# Patient Record
Sex: Male | Born: 1935
Health system: Southern US, Community
[De-identification: ages and names within clinical notes are randomized; demographics above are authoritative.]

## PROBLEM LIST (undated history)

## (undated) DIAGNOSIS — Z9289 Personal history of other medical treatment: Secondary | ICD-10-CM

## (undated) DIAGNOSIS — M1990 Unspecified osteoarthritis, unspecified site: Secondary | ICD-10-CM

## (undated) DIAGNOSIS — I509 Heart failure, unspecified: Secondary | ICD-10-CM

## (undated) DIAGNOSIS — D631 Anemia in chronic kidney disease: Secondary | ICD-10-CM

## (undated) DIAGNOSIS — Z9981 Dependence on supplemental oxygen: Secondary | ICD-10-CM

## (undated) DIAGNOSIS — E119 Type 2 diabetes mellitus without complications: Secondary | ICD-10-CM

## (undated) DIAGNOSIS — E785 Hyperlipidemia, unspecified: Secondary | ICD-10-CM

## (undated) DIAGNOSIS — N183 Chronic kidney disease, stage 3 unspecified: Secondary | ICD-10-CM

## (undated) DIAGNOSIS — I10 Essential (primary) hypertension: Secondary | ICD-10-CM

## (undated) DIAGNOSIS — J449 Chronic obstructive pulmonary disease, unspecified: Secondary | ICD-10-CM

## (undated) DIAGNOSIS — R0600 Dyspnea, unspecified: Secondary | ICD-10-CM

## (undated) HISTORY — DX: Essential (primary) hypertension: I10

## (undated) HISTORY — DX: Chronic obstructive pulmonary disease, unspecified: J44.9

## (undated) HISTORY — DX: Hyperlipidemia, unspecified: E78.5

## (undated) HISTORY — PX: EYE SURGERY: SHX253

## (undated) HISTORY — DX: Type 2 diabetes mellitus without complications: E11.9

---
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
## 2004-07-26 ENCOUNTER — Ambulatory Visit: Payer: Self-pay | Admitting: Internal Medicine

## 2005-06-06 ENCOUNTER — Ambulatory Visit: Payer: Self-pay | Admitting: Emergency Medicine

## 2005-07-18 ENCOUNTER — Ambulatory Visit: Payer: Self-pay | Admitting: Emergency Medicine

## 2005-09-14 ENCOUNTER — Ambulatory Visit: Payer: Self-pay | Admitting: Emergency Medicine

## 2006-03-07 ENCOUNTER — Ambulatory Visit: Payer: Self-pay | Admitting: Emergency Medicine

## 2006-09-10 ENCOUNTER — Ambulatory Visit: Payer: Self-pay | Admitting: Emergency Medicine

## 2006-10-24 DIAGNOSIS — J449 Chronic obstructive pulmonary disease, unspecified: Secondary | ICD-10-CM

## 2006-10-24 DIAGNOSIS — E119 Type 2 diabetes mellitus without complications: Secondary | ICD-10-CM | POA: Insufficient documentation

## 2006-10-24 DIAGNOSIS — E669 Obesity, unspecified: Secondary | ICD-10-CM | POA: Insufficient documentation

## 2006-10-24 DIAGNOSIS — J309 Allergic rhinitis, unspecified: Secondary | ICD-10-CM | POA: Insufficient documentation

## 2006-10-24 DIAGNOSIS — I10 Essential (primary) hypertension: Secondary | ICD-10-CM

## 2006-10-24 DIAGNOSIS — Z87891 Personal history of nicotine dependence: Secondary | ICD-10-CM | POA: Insufficient documentation

## 2007-01-16 ENCOUNTER — Ambulatory Visit: Payer: Self-pay | Admitting: Emergency Medicine

## 2009-04-22 ENCOUNTER — Ambulatory Visit: Payer: Self-pay | Admitting: Gastroenterology

## 2010-05-17 NOTE — Assessment & Plan Note (Signed)
[facility]                             PULMONARY OFFICE NOTE   DEWIGHT, FEARNOW                          MRN:          [id number]  DATE:09/10/2006                            DOB:          1933/11/28    SUBJECTIVE:  This is a scheduled 6 month followup visit for [name]  who is a 77 year old gentleman with obesity, diabetes, and significant  tobacco history.  We have been treating him for moderate COPD.  He  returns today, telling me that his breathing is great.  He does have  some difficulty when bending over.  There has not been any change in his  exertional tolerance since our last visit.  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  He has had some lower extremity  edema but otherwise no change in his symptoms.  He does not cough or  wheeze on a regular basis.  He has not been taking his Spiriva.  Instead, he has been inhaling off his wife's Spiriva after she completes  an initial dose administration.  I suspect that he has not been  receiving any of the medication given this technique.   MEDICATIONS:  1. Glucophage 1000 mg b.i.d.  2. Avalide 300/12.5 mg daily.  3. Actos 30 mg daily.  4. Furosemide 20 mg daily.  5. Spiriva 1 inhalation daily which she has not been taking correctly.  6. Multivitamin once daily.  7. Albuterol 2 puffs q.6h. p.r.n. for shortness of breath.   OBJECTIVE:  This is a pleasant obese man who is in no distress on room  air.  His weight 260 pounds which is 20 pounds higher than this time a year  ago.  Temperature 98.0, blood pressure 126/60, heart rate 74, SpO2 96%  on room air.  HEENT:  Benign.  NECK:  Supple without lymphadenopathy or stridor.  LUNGS:  Distant but clear.  He has no wheezing nor no forced expiration.  HEART:  Regular without murmur.  ABDOMEN:  Obese, soft, nontender, with positive bowel sounds.  EXTREMITIES:  Some mild pretibial edema.  NEUROLOGIC:  Nonfocal exam.    IMPRESSION:  1. Moderate chronic obstructive pulmonary disease, not currently on      bronchodilators given his technique with his Spiriva.  2. Obesity.  3. Diabetes mellitus.  4. Hypertension.   PLAN:  1. I have asked Mr. Suver to restart his Spiriva and to take it as      directed.  We reviewed the technique with him today.  2. I discussed weight loss including a diet and exercise plan and also      good diabetes control with him today.  I feel that his continued      weight gain is going to impact his pulmonary status at some spoint.  3. I will follow up with Mr. Parlette in 3 months to assess his status      on the Spiriva or sooner if he has any difficulty in the interim.     Collene Gobble, MD  Electronically Signed  RSB/MedQ  DD: 09/10/2006  DT: 09/10/2006  Job #: [id number]   cc:   Lisette Grinder

## 2010-05-20 NOTE — Assessment & Plan Note (Signed)
[place]                             PULMONARY OFFICE NOTE   NAYTHEN, FRARY                          MRN:          [id number]  DATE:03/07/2006                            DOB:          [birth year]    SUBJECTIVE:  Arthur [name] is a 77 year old gentleman who follows up today  for his chronic obstructive pulmonary disease. This is a regularly  scheduled follow up visit for him. He tells me that his breathing is  doing quite well. He is able to exert himself without any significant  limitations. He does have some minor cough, but this is not terribly  problematic. He denies any wheezing. He has not had any exacerbation  since our last visit, which was in September 2007.   MEDICATIONS:  1. Glucophage 1000 mg b.i.d.  2. Avalide 300/12.5 mg daily.  3. Actos 30 mg daily.  4. Furosemide 20 mg daily.  5. Spiriva 1 inhalation daily.  6. Multivitamin once daily.  7. Albuterol 2 puffs every 6 hours p.r.n. for shortness of breath.   REVIEW OF SYSTEMS:  [name] otherwise complains of lower extremity  edema that he believes began when he started taking his Actos.   PHYSICAL EXAMINATION:  GENERAL: This is a pleasant, somewhat obese  gentleman who is no distress on room air.  VITAL SIGNS: Weight 251 pounds which is up from 240 pounds back in  September, temperature 97.9, blood pressure 142/62, heart rate 63,  oxygen saturation 97% on room air.  HEENT: Oral pharynx is clear.  NECK: Large, but supple.  LUNGS: Clear to auscultation bilaterally. He does not wheeze on forced  expiration.  HEART: Regular rate and rhythm without murmur.  ABDOMEN: Soft and nontender with positive bowel sounds.  EXTREMITIES: 1+ edema to the mid shin.  NEUROLOGIC: Grossly non-focal.   IMPRESSION:  Chronic obstructive pulmonary disease, currently stable on  his maintenance bronchodilator regimen.   PLAN:  1. Continue Spiriva 1 inhalation daily with albuterol as needed.  2.  Follow up for Mr. Kluever in 6 months or sooner should he have any      difficulty in the interim.     Collene Gobble, MD  Electronically Signed    RSB/MedQ  DD: 03/07/2006  DT: 03/08/2006  Job #: [id number]   cc:   Signa Kell, MD, [facility]  Lisette Grinder

## 2010-05-20 NOTE — Assessment & Plan Note (Signed)
[place]                               PULMONARY OFFICE NOTE   JALIN, OLMSTED                          MRN:          [id number]  DATE:07/18/2005                            DOB:          1933-08-28    SUBJECTIVE:  Mr. Cich is a 77 year old man who follows up for his presumed  COPD and allergic rhinitis with associated cough.  He tells me that since  our last visit he has been doing fairly well.  He denies any significant  dyspnea or productive cough.  He is not having any congestion.  He has had  pulmonary function testing performed as detailed below.  His exertional  tolerance has been good.   MEDICATIONS:  1. Glucophage 1,000 mg two times daily.  2. Avalide 300/12.5 mg q day.  3. Actos 30 mg q day.  4. Furosemide 20 mg q day.  5. Spiriva one inhalation q day.  6. Multivitamin one q day.  7. Flonase nasal spray 2 sprays to each nostril q day p.r.n.   PHYSICAL EXAMINATION:  GENERAL:  This is an overweight man who is in no  distress.  HEENT:  His oropharynx is clear.  LUNGS:  Have some bilateral end expiratory wheezes but are for the most part  clear.  HEART:  Has a regular rate and rhythm without murmur.  ABDOMEN:  Obese, soft, nontender, with positive bowel sounds.  EXTREMITIES:  Trace bilateral lower extremity edema.  NEUROLOGICALLY:  He has a grossly nonfocal exam.   Pulmonary function tests performed on 07/18/2005 were a poor study. They were  limited by technique.  His best effort study showed evidence of airflow  limitation on spirometry with an FEV-1 of approximately 1.95 liters.  His  lung volumes fell into the normal range.  His DLCO was decreased and  corrected into the normal range for alveolar volume.  Chest x-ray performed  06/06/2005 was hyperinflated with no evidence of any other acute disease.  Note was made on the radiologist's interpretation of some possible early  ankylosing spondylitis.   IMPRESSION:  1. COPD.  2. Cough that appears to be somewhat better with the discontinuation of      Advair.  3. Allergic rhinitis.   PLAN:  1. Discontinue Xopenex nebulizers and add Albuterol HFA 2 puffs q 4 hours      p.r.n.  2. Continue Spiriva.  3. Followup in 3-4 months or sooner if he should have any difficulty.                                   Collene Gobble, MD   RSB/MedQ  DD:  09/05/2005  DT:  09/06/2005  Job #:  [id number]   cc:   Signa Kell, MD, [facility]  Lisette Grinder

## 2010-05-20 NOTE — Assessment & Plan Note (Signed)
[facility]                               PULMONARY OFFICE NOTE   MANDIP, FRAME                          MRN:          [id number]  DATE:09/14/2005                            DOB:          12-14-1933    SUBJECTIVE:  [name] is a 77 year old man with history of tobacco use and  COPD.  He returns today for his normal followup.  He tells me that he has  been taking his Spiriva every day.  He is not using any supplemental  albuterol, he has been able to work and feels quite well.  He denies any  shortness of breath.  He is having no cough or chest pain.   MEDICATIONS:  1. Glucophage 1000 mg b.i.d.  2. Avalide 300/12.5 mg daily.  3. Actos 30 mg daily.  4. Lasix 20 mg daily.  5. Spiriva 1 inhalation daily.  6. Multivitamin 1 daily.  7. Flonase 2 sprays each nostril p.r.n.  8. Albuterol 2 puffs q. 4 hrs p.r.n. which he is not requiring.   EXAMINATION:  GENERAL:  In general this is an obese gentleman in no distress  on room air.  VITAL SIGNS:  His weight is 240 pounds.  Temperature 97.6.  Blood pressure  128/50.  Heart rate is 65.  SvO2 96% on room air.  HEENT:  Benign.  LUNGS:  Clear to auscultation bilaterally without evidence of wheezing.  He  has good air movement.  HEART:  Has a regular rate and rhythm without murmur.  ABDOMEN:  Is obese, soft and benign.  EXTREMITIES:  No clubbing, cyanosis, or edema.   IMPRESSION:  1. Chronic obstructive pulmonary disease - currently stable.  2. Diabetes mellitus.  3. Hypertension.  4. Allergic rhinitis.   PLANS:  1. Continue Spiriva once daily and albuterol as needed.  2. Follow up with me in 6 months or sooner should he have any      difficulties.                                   Collene Gobble, MD   RSB/MedQ  DD:  09/14/2005  DT:  09/15/2005  Job #:  [id number]   cc:   Signa Kell, MD, [facility]  Lisette Grinder

## 2012-09-17 DIAGNOSIS — D099 Carcinoma in situ, unspecified: Secondary | ICD-10-CM

## 2012-09-17 HISTORY — DX: Carcinoma in situ, unspecified: D09.9

## 2012-10-29 ENCOUNTER — Ambulatory Visit: Payer: Self-pay | Admitting: Urology

## 2012-10-29 LAB — BASIC METABOLIC PANEL
BUN: 25 mg/dL — ABNORMAL HIGH (ref 7–18)
Calcium, Total: 8.6 mg/dL (ref 8.5–10.1)
Chloride: 104 mmol/L (ref 98–107)
Co2: 31 mmol/L (ref 21–32)
EGFR (African American): 50 — ABNORMAL LOW
EGFR (Non-African Amer.): 43 — ABNORMAL LOW
Glucose: 102 mg/dL — ABNORMAL HIGH (ref 65–99)
Osmolality: 282 (ref 275–301)
Potassium: 3.8 mmol/L (ref 3.5–5.1)
Sodium: 139 mmol/L (ref 136–145)

## 2012-10-29 LAB — HEMOGLOBIN: HGB: 12.1 g/dL — ABNORMAL LOW (ref 13.0–18.0)

## 2012-11-04 ENCOUNTER — Ambulatory Visit: Payer: Self-pay | Admitting: Urology

## 2013-08-06 DIAGNOSIS — IMO0002 Reserved for concepts with insufficient information to code with codable children: Secondary | ICD-10-CM | POA: Insufficient documentation

## 2014-04-24 NOTE — Op Note (Signed)
PATIENT NAME:  Arthur [name], Arthur [name] MR#:  [id number] DATE OF BIRTH:  December 31, 1933  DATE OF PROCEDURE:  11/04/2012  PREOPERATIVE DIAGNOSIS: Benign prostatic hypertrophy with bladder outlet obstruction.   POSTOPERATIVE DIAGNOSIS: Benign prostatic hypertrophy with bladder outlet obstruction.   PROCEDURE PERFORMED: Photovaporization of the prostate with a green light laser.   SURGEON: Maryan Puls, MD.  ANESTHETIST: Dr. Benjamine Mola and Dr. Yves Dill.  ANESTHETIC METHOD: General per Dr. Benjamine Mola and local per Dr. Yves Dill.   INDICATIONS: See the dictated history and physical. After informed consent, the patient requests the above procedure.   OPERATIVE SUMMARY: After adequate general anesthesia had been obtained, the patient was placed into dorsal lithotomy position and the perineum was prepped and draped in the usual fashion. The laser scope was coupled with the camera and then visually advanced into the bladder. The bladder was thoroughly inspected. Both ureteral orifices were identified and had clear efflux. No bladder mucosal tumors were identified. The bladder was moderately trabeculated. The patient had lateral lobe prostatic hypertrophy. At this point, the green light XPS laser fiber was introduced through the scope and set at 80 watts of power. The bladder neck tissue was then vaporized. Power was increased to 120 watts and remaining obstructive tissue from the bladder neck to the verumontanum was vaporized. At this point, the scope was removed. 10 mL of viscous Xylocaine was instilled within the urethra and the bladder. A [id number] silicone catheter was placed. The catheter was irrigated until clear. A B and O suppository was placed. The procedure was then terminated and the patient was transferred to the recovery room in stable condition.   ____________________________ Otelia Limes. Yves Dill, MD mrw:aw D: 11/04/2012 08:48:16 ET T: 11/04/2012 09:01:17 ET JOB#: [id number]  cc: Otelia Limes. Yves Dill, MD, <Dictator> Royston Cowper MD ELECTRONICALLY SIGNED 11/04/2012 12:54

## 2014-04-24 NOTE — H&P (Signed)
PATIENT NAME:  Arthur [name], AL MR#:  [id number] DATE OF BIRTH:  [birth year]  DATE OF ADMISSION:  10/29/2012  The patient is to have same-day surgery on 11/04/2012.  CHIEF COMPLAINT:  Difficulty voiding.    HISTORY OF PRESENT ILLNESS:  Mr. Gallian is a 81 year old white male with a long history of BPH, LUTS, HGPIN and elevated PSA. He is currently on maximal drug therapy with Sharyne Richters one a day. He continues to have significant lower urinary tract symptoms with an AUA symptom score of 24 and a quality of life score of 5. He is status post TUNA in 2003 and TUMT in 2012. He comes in now for photovaporization of the prostate with a greenlight laser.   ALLERGIES:  CODEINE.   CURRENT MEDICATIONS:  Included lisinopril, Lasix, NovoLog insulin, Levemir insulin and Jalyn.   PAST SURGICAL HISTORY:  Included: 1.  Cataract surgery.  2.  Repair of injured finger.  3.  TUNA 2003.  4.  TUMT 2012.   SOCIAL HISTORY:  The patient denied tobacco or alcohol use.   FAMILY HISTORY: Remarkable for diabetes and hypertension.  PAST AND CURRENT MEDICAL CONDITIONS:  1.  Diabetes.  2.  Hypertension.  3.  Hyperlipidemia.   REVIEW OF SYSTEMS:  The patient denied chest pain, heart disease, shortness of breath or stroke.   PHYSICAL EXAMINATION: GENERAL:  Obese white male in no distress.  HEENT: Sclerae were clear. Pupils were equally round and reactive to light and accommodation. Extraocular movements were intact. NECK:  Supple. No palpable cervical adenopathy.  LUNGS:  Clear to auscultation.  CARDIOVASCULAR:  Regular rhythm and rate without audible murmurs.  ABDOMEN:  Soft, nontender abdomen.  GENITOURINARY:  Uncircumcised. Testes smooth and nontender but atrophic, 10 mL in size each.  RECTAL:  Greater than 50 grams smooth nontender prostate.  NEUROMUSCULAR:  Alert and oriented x 3.   IMPRESSION:  Benign prostatic hypertrophy with bladder outlet obstruction.   PLAN: Photovaporization of the prostate with a green  light laser.   ____________________________ Otelia Limes. Yves Dill, MD mrw:ce D: 10/29/2012 12:00:24 ET T: 10/29/2012 12:08:16 ET JOB#: [id number]  cc: Otelia Limes. Yves Dill, MD, <Dictator> Royston Cowper MD ELECTRONICALLY SIGNED 10/29/2012 13:10

## 2015-01-22 DIAGNOSIS — Z794 Long term (current) use of insulin: Secondary | ICD-10-CM | POA: Insufficient documentation

## 2015-07-22 DIAGNOSIS — E119 Type 2 diabetes mellitus without complications: Secondary | ICD-10-CM | POA: Diagnosis not present

## 2015-07-22 DIAGNOSIS — H26492 Other secondary cataract, left eye: Secondary | ICD-10-CM | POA: Diagnosis not present

## 2015-09-01 DIAGNOSIS — Z794 Long term (current) use of insulin: Secondary | ICD-10-CM | POA: Diagnosis not present

## 2015-09-01 DIAGNOSIS — E1121 Type 2 diabetes mellitus with diabetic nephropathy: Secondary | ICD-10-CM | POA: Diagnosis not present

## 2015-09-08 DIAGNOSIS — I10 Essential (primary) hypertension: Secondary | ICD-10-CM | POA: Diagnosis not present

## 2015-09-08 DIAGNOSIS — D631 Anemia in chronic kidney disease: Secondary | ICD-10-CM | POA: Diagnosis not present

## 2015-09-08 DIAGNOSIS — Z794 Long term (current) use of insulin: Secondary | ICD-10-CM | POA: Diagnosis not present

## 2015-09-08 DIAGNOSIS — N189 Chronic kidney disease, unspecified: Secondary | ICD-10-CM | POA: Diagnosis not present

## 2015-09-08 DIAGNOSIS — E1121 Type 2 diabetes mellitus with diabetic nephropathy: Secondary | ICD-10-CM | POA: Diagnosis not present

## 2015-09-13 DIAGNOSIS — R3914 Feeling of incomplete bladder emptying: Secondary | ICD-10-CM | POA: Diagnosis not present

## 2015-09-13 DIAGNOSIS — N5201 Erectile dysfunction due to arterial insufficiency: Secondary | ICD-10-CM | POA: Diagnosis not present

## 2015-09-13 DIAGNOSIS — R351 Nocturia: Secondary | ICD-10-CM | POA: Diagnosis not present

## 2015-09-13 DIAGNOSIS — R3915 Urgency of urination: Secondary | ICD-10-CM | POA: Diagnosis not present

## 2015-09-13 DIAGNOSIS — D400 Neoplasm of uncertain behavior of prostate: Secondary | ICD-10-CM | POA: Diagnosis not present

## 2015-10-16 DIAGNOSIS — E119 Type 2 diabetes mellitus without complications: Secondary | ICD-10-CM | POA: Diagnosis not present

## 2015-10-25 DIAGNOSIS — R21 Rash and other nonspecific skin eruption: Secondary | ICD-10-CM | POA: Diagnosis not present

## 2015-10-25 DIAGNOSIS — L820 Inflamed seborrheic keratosis: Secondary | ICD-10-CM | POA: Diagnosis not present

## 2015-10-25 DIAGNOSIS — I8310 Varicose veins of unspecified lower extremity with inflammation: Secondary | ICD-10-CM | POA: Diagnosis not present

## 2015-10-25 DIAGNOSIS — L821 Other seborrheic keratosis: Secondary | ICD-10-CM | POA: Diagnosis not present

## 2015-10-25 DIAGNOSIS — I8311 Varicose veins of right lower extremity with inflammation: Secondary | ICD-10-CM | POA: Diagnosis not present

## 2015-11-04 ENCOUNTER — Encounter: Payer: Self-pay | Admitting: Occupational Therapy

## 2015-11-04 ENCOUNTER — Ambulatory Visit: Payer: PPO | Attending: Dermatology | Admitting: Occupational Therapy

## 2015-11-04 DIAGNOSIS — I890 Lymphedema, not elsewhere classified: Secondary | ICD-10-CM | POA: Diagnosis not present

## 2015-11-10 NOTE — Therapy (Signed)
[place] MAIN [facility] SERVICES [address] [place], [place], [address] Phone: [phone]   Fax:  [phone]  Occupational Therapy Evaluation  Patient Details  Name: Arthur [name] MRN: [id number] Date of Birth: April 17, 1933 No Data Recorded  Encounter Date: 11/04/2015      OT End of Session - 11/10/15 1710    Visit Number 1   Number of Visits 36   Date for OT Re-Evaluation 02/02/16   OT Start Time 1003   OT Stop Time 1100   OT Time Calculation (min) 57 min   Activity Tolerance Patient tolerated treatment well;No increased pain   Behavior During Therapy WFL for tasks assessed/performed      Past Medical History:  Diagnosis Date  . COPD (chronic obstructive pulmonary disease) ([place])   . Diabetes mellitus without complication ([place])   . Hyperlipidemia   . Hypertension     History reviewed. No pertinent surgical history.  There were no vitals filed for this visit.      Subjective Assessment - 11/10/15 1652    Subjective  Pt is referred by Brendolyn Patty , MD for Occupational Therapy evaluation and treatment of BLE lymphedema (LE). Pt reports chronic leg swelling with worsening skin condition. We discussed at length the nevative impact of lymphedema and diabetes on  infection risk and risk of non-healing wounds. Pt reports that he has been unable to waer compression stockins in the past because they cause pain and fit poorly in the past.   Pertinent History HTN, DM, Obesity,COPD PRECAUTIONS, Hx BLE wounds, Hx falls   Limitations difficulty walking, decreased standing tolerance, decreased LE sensation,    Patient Stated Goals decrease leg swelling and improve skin condition and keep it from getting worse   Currently in Pain? Yes   Pain Score --  7/10 chronic LB pain. Leg pain described below rated as 0/10 on intake form   Pain Location Leg   Pain Orientation Right;Left   Pain Descriptors / Indicators Guarding;Heaviness;Tiring;Tender;Sore    Pain Type Chronic pain   Pain Onset More than a month ago   Pain Frequency Intermittent   Aggravating Factors  standing, walking   Pain Relieving Factors sitting, elevation, rubbing           [facility] OT Assessment - 11/10/15 0001      Assessment   Diagnosis Mild-moderate, stage 2, BLE lymphedema 2/2 CVI and Obesity   Prior Therapy no     Precautions   Precautions Other (comment)  skin precautions, Pulmonary precautions     Home  Environment   Lives With Alone  recent [id number]     Prior Function   Level of Independence Independent with basic ADLs;Independent with transfers;Needs assistance with homemaking;Independent with household mobility without device;Independent with community mobility without device   Vocation Retired   Leisure family      Written Expression   Dominant Hand Right     Observation/Other Assessments   Observations Pt with 3+ pitting edema below knees bilaterally. Stemmer sign is positive at the base of the toes   Skin Integrity Skin is tight, shiney and mildly dry with areas of redness and fragile skin anteriorly on both distal legs. Color resembles hemociderine staining, but pattern is more defined and is tender to palpation.                          OT Education - 11/10/15 1710    Education provided Yes  Education Details Provided Pt/caregiver skilled education and ADL training throughout visit for lymphedema etiology, progression, and treatment including Intensive and Management Phase Complete Decongestive Therapy (CDT)  Discussed lymphedema precautions, cellulitis risk, and all CDT and LE self-care components, including compression wrapping/ garments & devices, lymphatic pumping ther ex, simple self-MLD, and skin care. Provided printed Lymphedema Workbook for reference.   Person(s) Educated Patient   Methods Explanation;Demonstration;Tactile cues;Verbal cues;Handout   Comprehension Verbalized understanding;Need further instruction              OT Long Term Goals - 11/10/15 1712      OT LONG TERM GOAL #1   Title Lymphedema (LE) management/ self-care: Pt able to apply multi layered, gradient compression wraps with min caregiver assistance using proper techniques within 2 weeks to achieve optimal limb volume reduction.   Baseline dependent   Time 2   Period Weeks   Status New     OT LONG TERM GOAL #2   Title Lymphedema (LE) management/ self-care:  Pt to achieve at least 10% LLE limb volume reductions bilaterally during Intensive CDT to limit LE progression, decrease infection and falls risk, to reduce pain/, and to improve safe ambulation and functional mobility.   Baseline dependent   Time 12   Period Weeks   Status New     OT LONG TERM GOAL #3   Baseline dependent   Time 12   Period Weeks   Status New     OT LONG TERM GOAL #4   Title Lymphedema (LE) management/ self-care:  Pt >/= 85 % compliant with all daily, LE self-care protocols for home program w/ needed level of caregiver assistance , including simple self-manual lymphatic drainage (MLD), skin care, lymphatic pumping the ex, skin care, and donning/ doffing compression wraps and garments o limit LE progression and further functional decline.     Baseline dependent   Time 12   Period Weeks   Status New     OT LONG TERM GOAL #5   Title Lymphedema (LE) management/ self-care:  Pt to tolerate daily compression wraps, garments and devices in keeping w/ prescribed wear regime within 1 week of issue date to progress and retain clinical and functional gains and to limit LE progression.   Baseline dependent   Time 12   Period Weeks   Status New     Long Term Additional Goals   Additional Long Term Goals Yes     OT LONG TERM GOAL #6   Title Lymphedema (LE) self-care:  During Management Phase CDT Pt to sustain limb volume reductions achieved during Intensive Phase CDT within 5% utilizing LE self-care protocols, appropriate compression garments/ devices,  and needed level of caregiver assistance.   Baseline dependent   Time 6   Period Months   Status New               Plan - 11/10/15 1728    Clinical Impression Statement Pt presents with mild-moderate, stage II, BLE lymphedema (LE) 2/2 obesity and suspected chronic venous insufficiency. Pt reports leg swelling started years ago without known precipitating event. He reports worsening swelling over time and which no longer resolves w/ elevation. Pt also endorces history of BLE wounds. He has not previously undergone LE therapy and he has difficulty tolerating compression garments in the past.  Chronic, progressive, BLE LE and associated pain and discomfort limits functional mobility, ambulation and standing tolerance. Leg swelling and pain limits ability to fit lower body clothing and street shoes, limits skin  care and inspection, limits ability to complete home management and productive tasks, and limits participation in leisure and social activities, at home and in the community. Skilled Occupational Therapy for LE care is medically necessary to reduce uncontrolled swelling and associated leg and foot pain, to decrease risk skin infections and non-healing wounds, to improve safe ambulation and functional mobility, to limit fall risk, and to increase level of independence with basic and instrumental ADLs, including mastery all LE self-care skills. Without skilled Occupational Therapy for Intensive and Management phase Complete Decongestive Therapy (CDT) and ADL training for lymphedema-self management, this patient's condition is likely to worsen and further functional decline is expected   Rehab Potential Good   OT Frequency 3x / week   OT Duration 12 weeks   OT Treatment/Interventions Self-care/ADL training;Therapeutic exercise;Functional Mobility Training;Patient/family education;Manual Therapy;Manual lymph drainage;DME and/or AE instruction;Compression bandaging;Therapeutic activities;Other  (comment)  skin care   Plan We will treat one leg at a time to limit falls risk. In additiion to Complete Decongestive Therapy (CDT), which includes [place] Lymphatic Drainage (MLD, skin care, ther ex, and compression therapy, emphasis will be on long term LE management via LE self care protocols and strategies. Pt will be fit with appropriate compression garments/ devices that are comfortable and easy to don and doff using assistive devices PRN   Consulted and Agree with Plan of Care Patient      Patient will benefit from skilled therapeutic intervention in order to improve the following deficits and impairments:  Decreased skin integrity, Decreased knowledge of precautions, Decreased activity tolerance, Decreased knowledge of use of DME, Impaired flexibility, Decreased balance, Difficulty walking, Obesity, Decreased range of motion, Increased edema, Pain  Visit Diagnosis: Lymphedema, not elsewhere classified - Plan: Ot plan of care cert/re-cert      G-Codes - 87/68/11 1713    Functional Assessment Tool Used Clinical observation, physical examination, medical records review for H&P, Pt and caregiver interview, comparative limb volumetrics   Functional Limitation Self care   Self Care Current Status (X7262) At least 80 percent but less than 100 percent impaired, limited or restricted   Self Care Goal Status (M3559) At least 1 percent but less than 20 percent impaired, limited or restricted      Problem List Patient Active Problem List   Diagnosis Date Noted  . DIABETES MELLITUS 10/24/2006  . OBESITY 10/24/2006  . HYPERTENSION 10/24/2006  . ALLERGIC RHINITIS 10/24/2006  . CHRONIC OBSTRUCTIVE PULMONARY DISEASE, MODERATE 10/24/2006  . TOBACCO ABUSE, HX OF 10/24/2006    Andrey Spearman, MS, OTR/L, [facility] 11/10/15 5:37 PM  [place] MAIN [facility] SERVICES [address] [place], [place], [address] Phone: [phone]   Fax:  [phone]  Name:  Arthur [name] MRN: [id number] Date of Birth: 08/14/1933

## 2015-11-10 NOTE — Patient Instructions (Signed)

## 2015-11-11 ENCOUNTER — Ambulatory Visit: Payer: PPO | Admitting: Occupational Therapy

## 2015-11-11 DIAGNOSIS — I890 Lymphedema, not elsewhere classified: Secondary | ICD-10-CM | POA: Diagnosis not present

## 2015-11-11 NOTE — Therapy (Signed)
[place] MAIN [facility] SERVICES [address] [place], [place], [address] Phone: [phone]   Fax:  [phone]  Occupational Therapy Treatment  Patient Details  Name: Arthur [name] MRN: [id number] Date of Birth: [birth year] No Data Recorded  Encounter Date: 11/11/2015      OT End of Session - 11/11/15 1324    Visit Number 2   Number of Visits 36   Date for OT Re-Evaluation 02/02/16   OT Start Time 1115   OT Stop Time 1215   OT Time Calculation (min) 60 min      Past Medical History:  Diagnosis Date  . COPD (chronic obstructive pulmonary disease) ([place])   . Diabetes mellitus without complication ([place])   . Hyperlipidemia   . Hypertension     No past surgical history on file.  There were no vitals filed for this visit.      Subjective Assessment - 11/11/15 1313    Subjective  Pt presents for Occupational Therapy visit 2 to addrress BLE lymphedema 2/2 obesity and CVI. Pt is eager to commence CDT. Pt tells me his daughter will attend visit tomorrow to learn compression wrapping.   Pertinent History HTN, DM, Obesity,COPD PRECAUTIONS, Hx BLE wounds, Hx falls   Limitations difficulty walking, decreased standing tolerance, decreased LE sensation,    Patient Stated Goals decrease leg swelling and improve skin condition and keep it from getting worse   Currently in Pain? No/denies   Pain Onset More than a month ago             LYMPHEDEMA/ONCOLOGY QUESTIONNAIRE - 11/11/15 1319      Lymphedema Assessments   Lymphedema Assessments Lower extremities     Right Lower Extremity Lymphedema   Other RLE A-D ( ankle to below knee) limb volume = 4835.29 ml     Left Lower Extremity Lymphedema   Other LLE A-D ( ankle to below knee) limb volume = 5113.45 ml   Other Limb volume differential (LVD) = 5.44%, L>R                 OT Treatments/Exercises (OP) - 11/11/15 0001      ADLs   ADL Education Given Yes     Manual Therapy    Manual Therapy Edema management;Manual Lymphatic Drainage (MLD);Compression Bandaging   Compression Bandaging RLE compression wraps applied from toes to below knee: Toe wrap omitted todayto reduce bulk so Pt can wear his street shoes home.  Cotton stockinett under  Single layer Rosidal foam from toes to below knee. One short strect wrap, 10 cm x 5 m  to foot and ankle, then 57m  x 5 m x 1,pplied circumferentially on top in custommary layered gradient configuration Will determine if wraps need modification and if Pt needs cast shoe based on assessment of volume reduction and tolerance tomorrow.                 OT Education - 11/11/15 1322    Education provided Yes   Education Details Emphasis of LE ADL training today on patient edu for proper compression wraps application using  circumferential, gradient techniques and proper positioning.    Person(s) Educated Patient   Methods Explanation;Demonstration   Comprehension Verbalized understanding;Need further instruction             OT Long Term Goals - 11/10/15 1712      OT LONG TERM GOAL #1   Title Lymphedema (LE) management/ self-care: Pt able to apply multi  layered, gradient compression wraps with min caregiver assistance using proper techniques within 2 weeks to achieve optimal limb volume reduction.   Baseline dependent   Time 2   Period Weeks   Status New     OT LONG TERM GOAL #2   Title Lymphedema (LE) management/ self-care:  Pt to achieve at least 10% LLE limb volume reductions bilaterally during Intensive CDT to limit LE progression, decrease infection and falls risk, to reduce pain/, and to improve safe ambulation and functional mobility.   Baseline dependent   Time 12   Period Weeks   Status New     OT LONG TERM GOAL #3   Baseline dependent   Time 12   Period Weeks   Status New     OT LONG TERM GOAL #4   Title Lymphedema (LE) management/ self-care:  Pt >/= 85 % compliant with all daily, LE self-care protocols  for home program w/ needed level of caregiver assistance , including simple self-manual lymphatic drainage (MLD), skin care, lymphatic pumping the ex, skin care, and donning/ doffing compression wraps and garments o limit LE progression and further functional decline.     Baseline dependent   Time 12   Period Weeks   Status New     OT LONG TERM GOAL #5   Title Lymphedema (LE) management/ self-care:  Pt to tolerate daily compression wraps, garments and devices in keeping w/ prescribed wear regime within 1 week of issue date to progress and retain clinical and functional gains and to limit LE progression.   Baseline dependent   Time 12   Period Weeks   Status New     Long Term Additional Goals   Additional Long Term Goals Yes     OT LONG TERM GOAL #6   Title Lymphedema (LE) self-care:  During Management Phase CDT Pt to sustain limb volume reductions achieved during Intensive Phase CDT within 5% utilizing LE self-care protocols, appropriate compression garments/ devices, and needed level of caregiver assistance.   Baseline dependent   Time 6   Period Months   Status New               Plan - 11/11/15 1325    Clinical Impression Statement BLE comparative limb volumetrics  from ankle to tibial tuberosity ( A-D) reveals a 5.44% limb volume differential (LVD) with LLE 5.44% greater in volume than the dominant RLE. Despite both legs presenting with pitting edema  and skin changes  unchanged from initial evaluation, Pt requests we commence CDT to RLE first bc it is the most painful to him. Pt tolerated 3 layer short stretch gradient compression wraps appied in clinic. Pt instructed to remove wrpas if they become painful, or uncomfortable, but if he's able optimal outcome for tolerance is to leave them on for next 24 hrs. Next visit we'll continue teaching caregiver wrapping as daughter is coming along to visit to learn.   Rehab Potential Good   OT Frequency 3x / week   OT Duration 12 weeks    OT Treatment/Interventions Self-care/ADL training;Therapeutic exercise;Functional Mobility Training;Patient/family education;Manual Therapy;Manual lymph drainage;DME and/or AE instruction;Compression bandaging;Therapeutic activities;Other (comment)  skin care   Consulted and Agree with Plan of Care Patient      Patient will benefit from skilled therapeutic intervention in order to improve the following deficits and impairments:  Decreased skin integrity, Decreased knowledge of precautions, Decreased activity tolerance, Decreased knowledge of use of DME, Impaired flexibility, Decreased balance, Difficulty walking, Obesity, Decreased range of motion,  Increased edema, Pain  Visit Diagnosis: Lymphedema, not elsewhere classified      G-Codes - 12/06/15 1713    Functional Assessment Tool Used Clinical observation, physical examination, medical records review for H&P, Pt and caregiver interview, comparative limb volumetrics   Functional Limitation Self care   Self Care Current Status (B9390) At least 80 percent but less than 100 percent impaired, limited or restricted   Self Care Goal Status (Z0092) At least 1 percent but less than 20 percent impaired, limited or restricted      Problem List Patient Active Problem List   Diagnosis Date Noted  . DIABETES MELLITUS 10/24/2006  . OBESITY 10/24/2006  . HYPERTENSION 10/24/2006  . ALLERGIC RHINITIS 10/24/2006  . CHRONIC OBSTRUCTIVE PULMONARY DISEASE, MODERATE 10/24/2006  . TOBACCO ABUSE, HX OF 10/24/2006    Andrey Spearman, MS, OTR/L, [facility] 11/11/15 1:34 PM  [place] MAIN [facility] SERVICES [address] [place], [place], [address] Phone: [phone]   Fax:  [phone]  Name: Arthur [name] MRN: [id number] Date of Birth: 1933-11-02

## 2015-11-11 NOTE — Patient Instructions (Signed)

## 2015-11-12 ENCOUNTER — Ambulatory Visit: Payer: PPO | Admitting: Occupational Therapy

## 2015-11-12 DIAGNOSIS — I890 Lymphedema, not elsewhere classified: Secondary | ICD-10-CM | POA: Diagnosis not present

## 2015-11-12 NOTE — Therapy (Signed)
[place] MAIN [facility] SERVICES [address] [place], [place], [address] Phone: [phone]   Fax:  [phone]  Occupational Therapy Treatment  Patient Details  Name: Arthur [name] MRN: [id number] Date of Birth: 12-06-1933 No Data Recorded  Encounter Date: 11/12/2015      OT End of Session - 11/12/15 1227    Visit Number 3   Number of Visits 36   Date for OT Re-Evaluation 02/02/16   OT Start Time 1105   OT Stop Time 1212   OT Time Calculation (min) 67 min      Past Medical History:  Diagnosis Date  . COPD (chronic obstructive pulmonary disease) ([place])   . Diabetes mellitus without complication ([place])   . Hyperlipidemia   . Hypertension     No past surgical history on file.  There were no vitals filed for this visit.      Subjective Assessment - 11/12/15 1110    Subjective  (P)  Pt presents for Occupational Therapy visit 3 to addrress BLE lymphedema 2/2 obesity and CVI. Pt is accompanied by [name], his daughter, and by his [name], his great grandson.    Pertinent History (P)  HTN, DM, Obesity,COPD PRECAUTIONS, Hx BLE wounds, Hx falls   Limitations (P)  difficulty walking, decreased standing tolerance, decreased LE sensation,    Patient Stated Goals (P)  decrease leg swelling and improve skin condition and keep it from getting worse   Currently in Pain? (P)  No/denies   Pain Onset (P)  More than a month ago             LYMPHEDEMA/ONCOLOGY QUESTIONNAIRE - 11/11/15 1319      Lymphedema Assessments   Lymphedema Assessments Lower extremities     Right Lower Extremity Lymphedema   Other RLE A-D ( ankle to below knee) limb volume = 4835.29 ml     Left Lower Extremity Lymphedema   Other LLE A-D ( ankle to below knee) limb volume = 5113.45 ml   Other Limb volume differential (LVD) = 5.44%, L>R                 OT Treatments/Exercises (OP) - 11/12/15 0001      ADLs   ADL Education Given Yes     Manual  Therapy   Manual Therapy Edema management;Manual Lymphatic Drainage (MLD);Compression Bandaging   Edema Management skin care to LLE below knee w/ Eucerin low pH lotion   Compression Bandaging RLE compression wraps applied from toes to below knee: Toe wrap omitted todayto reduce bulk so Pt can wear his street shoes home.  Cotton stockinett under  Single layer Rosidal foam from toes to below knee. One short strect wrap, 10 cm x 5 m  to foot and ankle, then 67m  x 5 m x 1,pplied circumferentially on top in custommary layered gradient configuration Will determine if wraps need modification and if Pt needs cast shoe based on assessment of volume reduction and tolerance tomorrow.                 OT Education - 11/12/15 1224    Education provided Yes   Education Details LLE compression wraps applied from toes to below knee: Toe wrap omitted today. Cotton stockinett then single layer Rosidal foam under  8 cm x 5 m x 1 to foot and ankle, then 38m  x 5 m x 1, applied circumferentially in custommary layered gradient configuration.   Person(s) Educated Patient;Child(ren)   Methods  Explanation;Demonstration;Tactile cues;Verbal cues;Handout   Comprehension Verbalized understanding;Returned demonstration;Verbal cues required;Tactile cues required             OT Long Term Goals - 11/10/15 1712      OT LONG TERM GOAL #1   Title Lymphedema (LE) management/ self-care: Pt able to apply multi layered, gradient compression wraps with min caregiver assistance using proper techniques within 2 weeks to achieve optimal limb volume reduction.   Baseline dependent   Time 2   Period Weeks   Status New     OT LONG TERM GOAL #2   Title Lymphedema (LE) management/ self-care:  Pt to achieve at least 10% LLE limb volume reductions bilaterally during Intensive CDT to limit LE progression, decrease infection and falls risk, to reduce pain/, and to improve safe ambulation and functional mobility.   Baseline  dependent   Time 12   Period Weeks   Status New     OT LONG TERM GOAL #3   Baseline dependent   Time 12   Period Weeks   Status New     OT LONG TERM GOAL #4   Title Lymphedema (LE) management/ self-care:  Pt >/= 85 % compliant with all daily, LE self-care protocols for home program w/ needed level of caregiver assistance , including simple self-manual lymphatic drainage (MLD), skin care, lymphatic pumping the ex, skin care, and donning/ doffing compression wraps and garments o limit LE progression and further functional decline.     Baseline dependent   Time 12   Period Weeks   Status New     OT LONG TERM GOAL #5   Title Lymphedema (LE) management/ self-care:  Pt to tolerate daily compression wraps, garments and devices in keeping w/ prescribed wear regime within 1 week of issue date to progress and retain clinical and functional gains and to limit LE progression.   Baseline dependent   Time 12   Period Weeks   Status New     Long Term Additional Goals   Additional Long Term Goals Yes     OT LONG TERM GOAL #6   Title Lymphedema (LE) self-care:  During Management Phase CDT Pt to sustain limb volume reductions achieved during Intensive Phase CDT within 5% utilizing LE self-care protocols, appropriate compression garments/ devices, and needed level of caregiver assistance.   Baseline dependent   Time 6   Period Months   Status New               Plan - 11/12/15 1227    Clinical Impression Statement Pt had only a little difficulty tolerating LLE knee length compression wraps since this time yesterday. Upon inspection limb volume is markedly decreased and raised red area is flattened. w/ lighter color. Skin is without signs/ symptoms of infection. By end of session adult daughter was able to appy 2 layer compression wrap w/ min A. She feels confident with a little more practice she will be able to master the techniques. Daughter will assist with compression wrapping during  visit intervals since Mr. Stepanek is unable to reach his feet.   Rehab Potential Good   OT Frequency 3x / week   OT Duration 12 weeks   OT Treatment/Interventions Self-care/ADL training;Therapeutic exercise;Functional Mobility Training;Patient/family education;Manual Therapy;Manual lymph drainage;DME and/or AE instruction;Compression bandaging;Therapeutic activities;Other (comment)  skin care   Consulted and Agree with Plan of Care Patient      Patient will benefit from skilled therapeutic intervention in order to improve the following deficits and impairments:  Decreased skin integrity, Decreased knowledge of precautions, Decreased activity tolerance, Decreased knowledge of use of DME, Impaired flexibility, Decreased balance, Difficulty walking, Obesity, Decreased range of motion, Increased edema, Pain  Visit Diagnosis: Lymphedema, not elsewhere classified    Problem List Patient Active Problem List   Diagnosis Date Noted  . DIABETES MELLITUS 10/24/2006  . OBESITY 10/24/2006  . HYPERTENSION 10/24/2006  . ALLERGIC RHINITIS 10/24/2006  . CHRONIC OBSTRUCTIVE PULMONARY DISEASE, MODERATE 10/24/2006  . TOBACCO ABUSE, HX OF 10/24/2006    Andrey Spearman, MS, OTR/L, [facility] 11/12/15 12:31 PM   [place] MAIN [facility] SERVICES [address] [place], [place], [address] Phone: [phone]   Fax:  [phone]  Name: ALLIE GERHOLD MRN: [id number] Date of Birth: Dec 07, 1933

## 2015-11-16 ENCOUNTER — Ambulatory Visit: Payer: PPO | Admitting: Occupational Therapy

## 2015-11-16 DIAGNOSIS — I890 Lymphedema, not elsewhere classified: Secondary | ICD-10-CM

## 2015-11-16 NOTE — Therapy (Signed)
[place] MAIN [facility] SERVICES [address] [place], [place], [address] Phone: [phone]   Fax:  [phone]  Occupational Therapy Treatment  Patient Details  Name: Arthur [name] MRN: [id number] Date of Birth: 1933-01-06 No Data Recorded  Encounter Date: 11/16/2015      OT End of Session - 11/16/15 1218    Visit Number 4   Number of Visits 36   Date for OT Re-Evaluation 02/02/16   OT Start Time 0906   OT Stop Time 1006   OT Time Calculation (min) 60 min      Past Medical History:  Diagnosis Date  . COPD (chronic obstructive pulmonary disease) ([place])   . Diabetes mellitus without complication ([place])   . Hyperlipidemia   . Hypertension     No past surgical history on file.  There were no vitals filed for this visit.      Subjective Assessment - 11/16/15 1213    Subjective  Pt presents for Occupational Therapy visit 4 to addrress BLE lymphedema 2/2 obesity and CVI. Pt reports that he was able to tolerate compression wraps after last treatment until late at night before bed when he had to remove them . "The top of my foot was killing me."   Pertinent History HTN, DM, Obesity,COPD PRECAUTIONS, Hx BLE wounds, Hx falls   Limitations difficulty walking, decreased standing tolerance, decreased LE sensation,    Patient Stated Goals decrease leg swelling and improve skin condition and keep it from getting worse   Currently in Pain? No/denies   Pain Onset More than a month ago                      OT Treatments/Exercises (OP) - 11/16/15 0001      ADLs   ADL Education Given Yes     Manual Therapy   Manual Therapy Edema management;Manual Lymphatic Drainage (MLD);Compression Bandaging   Edema Management skin care to LLE below knee w/ Eucerin low pH lotion   Compression Bandaging RLE compression wraps applied from toes to below knee: Toe wrap omitted todayto reduce bulk so Pt can wear his street shoes home.  Cotton  stockinett under  Single layer Rosidal foam from toes to below knee. One short strect wrap, 10 cm x 5 m  to foot and ankle, then 70m  x 5 m x 1,pplied circumferentially on top in custommary layered gradient configuration Will determine if wraps need modification and if Pt needs cast shoe based on assessment of volume reduction and tolerance tomorrow.                 OT Education - 11/16/15 1216    Education provided Yes   Education Details Pt edu for LE self care w/ emphasis on intro level simple self-MLD   Person(s) Educated Patient   Methods Explanation;Demonstration;Tactile cues;Verbal cues;Handout   Comprehension Verbalized understanding;Need further instruction             OT Long Term Goals - 11/10/15 1712      OT LONG TERM GOAL #1   Title Lymphedema (LE) management/ self-care: Pt able to apply multi layered, gradient compression wraps with min caregiver assistance using proper techniques within 2 weeks to achieve optimal limb volume reduction.   Baseline dependent   Time 2   Period Weeks   Status New     OT LONG TERM GOAL #2   Title Lymphedema (LE) management/ self-care:  Pt to achieve at least 10%  LLE limb volume reductions bilaterally during Intensive CDT to limit LE progression, decrease infection and falls risk, to reduce pain/, and to improve safe ambulation and functional mobility.   Baseline dependent   Time 12   Period Weeks   Status New     OT LONG TERM GOAL #3   Baseline dependent   Time 12   Period Weeks   Status New     OT LONG TERM GOAL #4   Title Lymphedema (LE) management/ self-care:  Pt >/= 85 % compliant with all daily, LE self-care protocols for home program w/ needed level of caregiver assistance , including simple self-manual lymphatic drainage (MLD), skin care, lymphatic pumping the ex, skin care, and donning/ doffing compression wraps and garments o limit LE progression and further functional decline.     Baseline dependent   Time 12    Period Weeks   Status New     OT LONG TERM GOAL #5   Title Lymphedema (LE) management/ self-care:  Pt to tolerate daily compression wraps, garments and devices in keeping w/ prescribed wear regime within 1 week of issue date to progress and retain clinical and functional gains and to limit LE progression.   Baseline dependent   Time 12   Period Weeks   Status New     Long Term Additional Goals   Additional Long Term Goals Yes     OT LONG TERM GOAL #6   Title Lymphedema (LE) self-care:  During Management Phase CDT Pt to sustain limb volume reductions achieved during Intensive Phase CDT within 5% utilizing LE self-care protocols, appropriate compression garments/ devices, and needed level of caregiver assistance.   Baseline dependent   Time 6   Period Months   Status New               Plan - 11/16/15 1219    Clinical Impression Statement Pt had moderate difficulty tolerating compression wraps applied by his daughter after last visit and had to remove them before going to bed. Lateral aspect of proximal RLE mildly tender to palpation during MLD today, but no sign of bruising and no palpable mass. Leg is  with visible decrease in swelling and less tissue density today after interval. Daughters wraps applied properly when removed for treatment. Provided long handled sponge to assist Pt with bathing feet.  All aspects of care well tolerated. today. Cont as per POC.   Rehab Potential Good   OT Frequency 3x / week   OT Duration 12 weeks   OT Treatment/Interventions Self-care/ADL training;Therapeutic exercise;Functional Mobility Training;Patient/family education;Manual Therapy;Manual lymph drainage;DME and/or AE instruction;Compression bandaging;Therapeutic activities;Other (comment)  skin care   Consulted and Agree with Plan of Care Patient      Patient will benefit from skilled therapeutic intervention in order to improve the following deficits and impairments:  Decreased skin  integrity, Decreased knowledge of precautions, Decreased activity tolerance, Decreased knowledge of use of DME, Impaired flexibility, Decreased balance, Difficulty walking, Obesity, Decreased range of motion, Increased edema, Pain  Visit Diagnosis: Lymphedema, not elsewhere classified    Problem List Patient Active Problem List   Diagnosis Date Noted  . DIABETES MELLITUS 10/24/2006  . OBESITY 10/24/2006  . HYPERTENSION 10/24/2006  . ALLERGIC RHINITIS 10/24/2006  . CHRONIC OBSTRUCTIVE PULMONARY DISEASE, MODERATE 10/24/2006  . TOBACCO ABUSE, HX OF 10/24/2006   Andrey Spearman, MS, OTR/L, [facility] 11/16/15 12:27 PM   [place] MAIN [facility] SERVICES [address] [place], [place], [address] Phone: [phone]  Fax:  [phone]  Name: RAKAN SOFFER MRN: [id number] Date of Birth: May 23, 1933

## 2015-11-22 ENCOUNTER — Ambulatory Visit: Payer: PPO | Admitting: Occupational Therapy

## 2015-11-22 DIAGNOSIS — I890 Lymphedema, not elsewhere classified: Secondary | ICD-10-CM

## 2015-11-22 NOTE — Patient Instructions (Signed)
LE instructions and precautions as established- see initial eval.   

## 2015-11-22 NOTE — Therapy (Signed)
[place] MAIN [facility] SERVICES [address] [place], [place], [address] Phone: [phone]   Fax:  [phone]  Occupational Therapy Treatment  Patient Details  Name: Arthur [name] MRN: [id number] Date of Birth: May 29, 1933 No Data Recorded  Encounter Date: 11/22/2015      OT End of Session - 11/22/15 1652    Visit Number 5   Number of Visits 36   Date for OT Re-Evaluation 02/02/16   OT Start Time 0105   OT Stop Time 0215   OT Time Calculation (min) 70 min      Past Medical History:  Diagnosis Date  . COPD (chronic obstructive pulmonary disease) ([place])   . Diabetes mellitus without complication ([place])   . Hyperlipidemia   . Hypertension     No past surgical history on file.  There were no vitals filed for this visit.      Subjective Assessment - 11/22/15 1645    Subjective  Pt presents for Occupational Therapy visit 5 to address BLE lymphedema 2/2 obesity and CVI. Pt reports that his daughter is wrapping his leg every day.  "It looks a lot better to me."   Limitations difficulty walking, decreased standing tolerance, decreased LE sensation,    Patient Stated Goals decrease leg swelling and improve skin condition and keep it from getting worse   Currently in Pain? No/denies   Pain Onset More than a month ago             LYMPHEDEMA/ONCOLOGY QUESTIONNAIRE - 11/22/15 1648      Right Lower Extremity Lymphedema   Other RLE A-D ( ankle to below knee) limb volume = 4351.82 ml.    Other RLE A-D limb volume is decreased by 10% overall. GOAL MET TODAY!                 OT Treatments/Exercises (OP) - 11/22/15 0001      ADLs   ADL Education Given Yes     Manual Therapy   Manual Therapy Edema management;Manual Lymphatic Drainage (MLD);Compression Bandaging   Manual therapy comments BLE comparative limb volumetrics   Edema Management skin care to LLE below knee w/ Eucerin low pH lotion   Manual Lymphatic Drainage (MLD)  MLD to LLE as established   Compression Bandaging RLE compression wraps applied from toes to below knee: Toe wrap omitted todayto reduce bulk so Pt can wear his street shoes home.  Cotton stockinett under  Single layer Rosidal foam from toes to below knee. One short strect wrap, 10 cm x 5 m  to foot and ankle, then 37m x 5 m x 1,pplied circumferentially on top in custommary layered gradient configuration Will determine if wraps need modification and if Pt needs cast shoe based on assessment of volume reduction and tolerance tomorrow.                 OT Education - 11/22/15 1650    Education provided Yes   Education Details Emphasis of LE self care edu today on traditional and non-traditional , adjustable daytime compression options.  Pt more interested in traditional elastic knee highs and trying various assistive devices for donning and doffing, than in adjustable CircAids at present.   Person(s) Educated Patient   Methods Explanation;Demonstration   Comprehension Verbalized understanding;Need further instruction             OT Long Term Goals - 11/10/15 1712      OT LONG TERM GOAL #1  Title Lymphedema (LE) management/ self-care: Pt able to apply multi layered, gradient compression wraps with min caregiver assistance using proper techniques within 2 weeks to achieve optimal limb volume reduction.   Baseline dependent   Time 2   Period Weeks   Status New     OT LONG TERM GOAL #2   Title Lymphedema (LE) management/ self-care:  Pt to achieve at least 10% LLE limb volume reductions bilaterally during Intensive CDT to limit LE progression, decrease infection and falls risk, to reduce pain/, and to improve safe ambulation and functional mobility.   Baseline dependent   Time 12   Period Weeks   Status New     OT LONG TERM GOAL #3   Baseline dependent   Time 12   Period Weeks   Status New     OT LONG TERM GOAL #4   Title Lymphedema (LE) management/ self-care:  Pt >/= 85 %  compliant with all daily, LE self-care protocols for home program w/ needed level of caregiver assistance , including simple self-manual lymphatic drainage (MLD), skin care, lymphatic pumping the ex, skin care, and donning/ doffing compression wraps and garments o limit LE progression and further functional decline.     Baseline dependent   Time 12   Period Weeks   Status New     OT LONG TERM GOAL #5   Title Lymphedema (LE) management/ self-care:  Pt to tolerate daily compression wraps, garments and devices in keeping w/ prescribed wear regime within 1 week of issue date to progress and retain clinical and functional gains and to limit LE progression.   Baseline dependent   Time 12   Period Weeks   Status New     Long Term Additional Goals   Additional Long Term Goals Yes     OT LONG TERM GOAL #6   Title Lymphedema (LE) self-care:  During Management Phase CDT Pt to sustain limb volume reductions achieved during Intensive Phase CDT within 5% utilizing LE self-care protocols, appropriate compression garments/ devices, and needed level of caregiver assistance.   Baseline dependent   Time 6   Period Months   Status New               Plan - 11/22/15 1653    Clinical Impression Statement BLE comparative limb volumetrics of RLE (treatment limb) today reveal a 10% volume decrease below the R knee since commencing OT for CDT. This value demonstrates inital VOLUME GOAL IS MET.  RLE below knee appears very well decongested. Skin scales continue to fall off and hydration and redness improved each visit.  Pt more interested in traditional elastic knee highs and trying various assistive devices for donning and doffing, than in adjustable CircAids at present.. Cont as per POC.    Rehab Potential Good   OT Frequency 3x / week   OT Duration 12 weeks   OT Treatment/Interventions Self-care/ADL training;Therapeutic exercise;Functional Mobility Training;Patient/family education;Manual Therapy;Manual  lymph drainage;DME and/or AE instruction;Compression bandaging;Therapeutic activities;Other (comment)  skin care   Consulted and Agree with Plan of Care Patient      Patient will benefit from skilled therapeutic intervention in order to improve the following deficits and impairments:  Decreased skin integrity, Decreased knowledge of precautions, Decreased activity tolerance, Decreased knowledge of use of DME, Impaired flexibility, Decreased balance, Difficulty walking, Obesity, Decreased range of motion, Increased edema, Pain  Visit Diagnosis: Lymphedema, not elsewhere classified    Problem List Patient Active Problem List   Diagnosis Date Noted  .  DIABETES MELLITUS 10/24/2006  . OBESITY 10/24/2006  . HYPERTENSION 10/24/2006  . ALLERGIC RHINITIS 10/24/2006  . CHRONIC OBSTRUCTIVE PULMONARY DISEASE, MODERATE 10/24/2006  . TOBACCO ABUSE, HX OF 10/24/2006    Andrey Spearman, MS, OTR/L, [facility] 11/22/15 4:56 PM  [place] MAIN [facility] SERVICES [address] [place], [place], [address] Phone: [phone]   Fax:  [phone]  Name: TREVIOUS RAMPEY MRN: [id number] Date of Birth: [birth year]

## 2015-11-24 ENCOUNTER — Ambulatory Visit: Payer: PPO | Admitting: Occupational Therapy

## 2015-11-24 DIAGNOSIS — I890 Lymphedema, not elsewhere classified: Secondary | ICD-10-CM

## 2015-11-24 NOTE — Therapy (Signed)
[place] MAIN [facility] SERVICES [address] [place], [place], [address] Phone: [phone]   Fax:  [phone]  Occupational Therapy Treatment  Patient Details  Name: Arthur [name] MRN: [id number] Date of Birth: 1933/07/30 No Data Recorded  Encounter Date: 11/24/2015      OT End of Session - 11/24/15 1422    Visit Number 5   Number of Visits 36   Date for OT Re-Evaluation 02/02/16   OT Start Time 0100   OT Stop Time 0215   OT Time Calculation (min) 75 min      Past Medical History:  Diagnosis Date  . COPD (chronic obstructive pulmonary disease) ([place])   . Diabetes mellitus without complication ([place])   . Hyperlipidemia   . Hypertension     No past surgical history on file.  There were no vitals filed for this visit.      Subjective Assessment - 11/24/15 1422    Subjective  Pt presents for Occupational Therapy visit 6 to address BLE lymphedema 2/2 obesity and CVI. Pt reports that his daughter continues to assist with skin care and reapply wraps every day.     Limitations difficulty walking, decreased standing tolerance, decreased LE sensation,    Patient Stated Goals decrease leg swelling and improve skin condition and keep it from getting worse   Currently in Pain? No/denies   Pain Onset More than a month ago                      OT Treatments/Exercises (OP) - 11/24/15 0001      ADLs   ADL Education Given Yes     Manual Therapy   Manual Therapy Edema management;Manual Lymphatic Drainage (MLD);Compression Bandaging   Manual therapy comments BLE comparative limb volumetrics   Edema Management skin care to LLE below knee w/ Eucerin low pH lotion   Manual Lymphatic Drainage (MLD) MLD to LLE as established   Compression Bandaging RLE compression wraps applied from toes to below knee: Toe wrap omitted todayto reduce bulk so Pt can wear his street shoes home.  Cotton stockinett under  Single layer Rosidal foam from  toes to below knee. One short strect wrap, 10 cm x 5 m  to foot and ankle, then 77m  x 5 m x 1,pplied circumferentially on top in custommary layered gradient configuration Will determine if wraps need modification and if Pt needs cast shoe based on assessment of volume reduction and tolerance tomorrow.                 OT Education - 11/24/15 1423    Education provided Yes   Education Details Emphasis of skilled LE self-care training today on  Introductory level edu on individualized compression garment/ device recommendations,  proper fit and function, wear and care regimes, and assistive device options for donning and doffing w/ less physical effort.. Provided printed website vendor information and specifications for on liine shopping.   Person(s) Educated Patient   Methods Explanation;Demonstration   Comprehension Verbalized understanding;Need further instruction             OT Long Term Goals - 11/10/15 1712      OT LONG TERM GOAL #1   Title Lymphedema (LE) management/ self-care: Pt able to apply multi layered, gradient compression wraps with min caregiver assistance using proper techniques within 2 weeks to achieve optimal limb volume reduction.   Baseline dependent   Time 2   Period Weeks  Status New     OT LONG TERM GOAL #2   Title Lymphedema (LE) management/ self-care:  Pt to achieve at least 10% LLE limb volume reductions bilaterally during Intensive CDT to limit LE progression, decrease infection and falls risk, to reduce pain/, and to improve safe ambulation and functional mobility.   Baseline dependent   Time 12   Period Weeks   Status New     OT LONG TERM GOAL #3   Baseline dependent   Time 12   Period Weeks   Status New     OT LONG TERM GOAL #4   Title Lymphedema (LE) management/ self-care:  Pt >/= 85 % compliant with all daily, LE self-care protocols for home program w/ needed level of caregiver assistance , including simple self-manual lymphatic  drainage (MLD), skin care, lymphatic pumping the ex, skin care, and donning/ doffing compression wraps and garments o limit LE progression and further functional decline.     Baseline dependent   Time 12   Period Weeks   Status New     OT LONG TERM GOAL #5   Title Lymphedema (LE) management/ self-care:  Pt to tolerate daily compression wraps, garments and devices in keeping w/ prescribed wear regime within 1 week of issue date to progress and retain clinical and functional gains and to limit LE progression.   Baseline dependent   Time 12   Period Weeks   Status New     Long Term Additional Goals   Additional Long Term Goals Yes     OT LONG TERM GOAL #6   Title Lymphedema (LE) self-care:  During Management Phase CDT Pt to sustain limb volume reductions achieved during Intensive Phase CDT within 5% utilizing LE self-care protocols, appropriate compression garments/ devices, and needed level of caregiver assistance.   Baseline dependent   Time 6   Period Months   Status New               Plan - 11/24/15 1427    Clinical Impression Statement RLE continues to show improvement in decreased limb volume, decreased tissue protien, and improved hydration. RLE is essentially decongested and is ready for fitting ccl 2 elastic knee high compression garments. Pt agreed w OT's recommendation to trial one size larger  ( sz V vs IV)  in effort to make donning and doffing easier.   Rehab Potential Good   OT Frequency 3x / week   OT Duration 12 weeks   OT Treatment/Interventions Self-care/ADL training;Therapeutic exercise;Functional Mobility Training;Patient/family education;Manual Therapy;Manual lymph drainage;DME and/or AE instruction;Compression bandaging;Therapeutic activities;Other (comment)  skin care   Consulted and Agree with Plan of Care Patient      Patient will benefit from skilled therapeutic intervention in order to improve the following deficits and impairments:  Decreased skin  integrity, Decreased knowledge of precautions, Decreased activity tolerance, Decreased knowledge of use of DME, Impaired flexibility, Decreased balance, Difficulty walking, Obesity, Decreased range of motion, Increased edema, Pain  Visit Diagnosis: Lymphedema, not elsewhere classified    Problem List Patient Active Problem List   Diagnosis Date Noted  . DIABETES MELLITUS 10/24/2006  . OBESITY 10/24/2006  . HYPERTENSION 10/24/2006  . ALLERGIC RHINITIS 10/24/2006  . CHRONIC OBSTRUCTIVE PULMONARY DISEASE, MODERATE 10/24/2006  . TOBACCO ABUSE, HX OF 10/24/2006    Andrey Spearman, MS, OTR/L, [facility] 11/24/15 2:32 PM  [place] MAIN [facility] SERVICES [address] [place], [place], [address] Phone: [phone]   Fax:  [phone]  Name: Gavan  BRETTON TANDY MRN: [id number] Date of Birth: August 14, 1933

## 2015-11-29 ENCOUNTER — Ambulatory Visit: Payer: PPO | Admitting: Occupational Therapy

## 2015-11-29 DIAGNOSIS — I890 Lymphedema, not elsewhere classified: Secondary | ICD-10-CM | POA: Diagnosis not present

## 2015-11-29 NOTE — Therapy (Signed)
[place] MAIN [facility] SERVICES [address] [place], [place], [address] Phone: [phone]   Fax:  [phone]  Occupational Therapy Treatment  Patient Details  Name: Arthur [name] MRN: [id number] Date of Birth: 1933/02/09 No Data Recorded  Encounter Date: 11/29/2015      OT End of Session - 11/29/15 1616    Visit Number 7   Number of Visits 36   Date for OT Re-Evaluation 02/02/16   OT Start Time 0103   OT Stop Time 0208   OT Time Calculation (min) 65 min      Past Medical History:  Diagnosis Date  . COPD (chronic obstructive pulmonary disease) ([place])   . Diabetes mellitus without complication ([place])   . Hyperlipidemia   . Hypertension     No past surgical history on file.  There were no vitals filed for this visit.      Subjective Assessment - 11/29/15 1613    Subjective  Pt presents for Occupational Therapy visit 7 to address BLE lymphedema 2/2 obesity and CVI. Pt reports that he did not order recommended compression garments because a friend plans to give him some stockings " leftover from her husband".   Limitations difficulty walking, decreased standing tolerance, decreased LE sensation,    Patient Stated Goals decrease leg swelling and improve skin condition and keep it from getting worse   Currently in Pain? No/denies   Pain Onset More than a month ago                      OT Treatments/Exercises (OP) - 11/29/15 0001      ADLs   ADL Education Given Yes     Manual Therapy   Manual Therapy Edema management;Manual Lymphatic Drainage (MLD);Compression Bandaging   Edema Management skin care to LLE below knee w/ Eucerin low pH lotion   Manual Lymphatic Drainage (MLD) MLD to LLE as established   Compression Bandaging RLE compression wraps applied from toes to below knee: Toe wrap omitted todayto reduce bulk so Pt can wear his street shoes home.  Cotton stockinett under  Single layer Rosidal foam from toes to  below knee. One short strect wrap, 10 cm x 5 m  to foot and ankle, then 25m  x 5 m x 1,pplied circumferentially on top in custommary layered gradient configuration Will determine if wraps need modification and if Pt needs cast shoe based on assessment of volume reduction and tolerance tomorrow.                 OT Education - 11/29/15 1616    Education provided Yes   Education Details Con tinued Equities trader Education  And LE ADL training throughout visit for lymphedema self care, including compression wrapping, compression garment and device wear/care, lymphatic pumping ther ex, simple self-MLD, and skin care. Discussed progress towards goals.   Person(s) Educated Patient   Methods Explanation;Demonstration;Verbal cues;Tactile cues   Comprehension Verbalized understanding;Need further instruction             OT Long Term Goals - 11/10/15 1712      OT LONG TERM GOAL #1   Title Lymphedema (LE) management/ self-care: Pt able to apply multi layered, gradient compression wraps with min caregiver assistance using proper techniques within 2 weeks to achieve optimal limb volume reduction.   Baseline dependent   Time 2   Period Weeks   Status New     OT LONG TERM GOAL #2  Title Lymphedema (LE) management/ self-care:  Pt to achieve at least 10% LLE limb volume reductions bilaterally during Intensive CDT to limit LE progression, decrease infection and falls risk, to reduce pain/, and to improve safe ambulation and functional mobility.   Baseline dependent   Time 12   Period Weeks   Status New     OT LONG TERM GOAL #3   Baseline dependent   Time 12   Period Weeks   Status New     OT LONG TERM GOAL #4   Title Lymphedema (LE) management/ self-care:  Pt >/= 85 % compliant with all daily, LE self-care protocols for home program w/ needed level of caregiver assistance , including simple self-manual lymphatic drainage (MLD), skin care, lymphatic pumping the ex, skin care, and  donning/ doffing compression wraps and garments o limit LE progression and further functional decline.     Baseline dependent   Time 12   Period Weeks   Status New     OT LONG TERM GOAL #5   Title Lymphedema (LE) management/ self-care:  Pt to tolerate daily compression wraps, garments and devices in keeping w/ prescribed wear regime within 1 week of issue date to progress and retain clinical and functional gains and to limit LE progression.   Baseline dependent   Time 12   Period Weeks   Status New     Long Term Additional Goals   Additional Long Term Goals Yes     OT LONG TERM GOAL #6   Title Lymphedema (LE) self-care:  During Management Phase CDT Pt to sustain limb volume reductions achieved during Intensive Phase CDT within 5% utilizing LE self-care protocols, appropriate compression garments/ devices, and needed level of caregiver assistance.   Baseline dependent   Time 6   Period Months   Status New               Plan - 11/29/15 1617    Clinical Impression Statement Pt able to sustain RLE clinical gains over long holiday weekend with assistance from his daughter. Unfortunately he did not purchase recommended compression garments b/c he hope to be able to use garments soon to be gifted by a firiend.  Reviewed plan to commence LLE CDT as soon as RLE compression garments are fitted. Will review again rational behind recommended compression garments next visit as it's possible, but doubtful that gifted compression garments will be appropriate in this case.    Rehab Potential Good   OT Frequency 3x / week   OT Duration 12 weeks   OT Treatment/Interventions Self-care/ADL training;Therapeutic exercise;Functional Mobility Training;Patient/family education;Manual Therapy;Manual lymph drainage;DME and/or AE instruction;Compression bandaging;Therapeutic activities;Other (comment)  skin care   Consulted and Agree with Plan of Care Patient      Patient will benefit from skilled  therapeutic intervention in order to improve the following deficits and impairments:  Decreased skin integrity, Decreased knowledge of precautions, Decreased activity tolerance, Decreased knowledge of use of DME, Impaired flexibility, Decreased balance, Difficulty walking, Obesity, Decreased range of motion, Increased edema, Pain  Visit Diagnosis: Lymphedema, not elsewhere classified    Problem List Patient Active Problem List   Diagnosis Date Noted  . DIABETES MELLITUS 10/24/2006  . OBESITY 10/24/2006  . HYPERTENSION 10/24/2006  . ALLERGIC RHINITIS 10/24/2006  . CHRONIC OBSTRUCTIVE PULMONARY DISEASE, MODERATE 10/24/2006  . TOBACCO ABUSE, HX OF 10/24/2006    Andrey Spearman, MS, OTR/L, [facility] 11/29/15 4:22 PM  [place] MAIN REHAB SERVICES [place],  [place], [address] Phone: [phone]   Fax:  [phone]  Name: Arthur [name] MRN: [id number] Date of Birth: July 16, 1933

## 2015-11-29 NOTE — Patient Instructions (Signed)
LE instructions and precautions as established- see initial eval.   

## 2015-12-01 ENCOUNTER — Ambulatory Visit: Payer: PPO | Admitting: Occupational Therapy

## 2015-12-01 DIAGNOSIS — I890 Lymphedema, not elsewhere classified: Secondary | ICD-10-CM

## 2015-12-01 NOTE — Therapy (Signed)
[place] MAIN [facility] SERVICES [address] [place], [place], [address] Phone: [phone]   Fax:  [phone]  Occupational Therapy Treatment  Patient Details  Name: Arthur [name] MRN: [id number] Date of Birth: 1933/04/08 No Data Recorded  Encounter Date: 12/01/2015      OT End of Session - 12/01/15 1512    Visit Number 8   Number of Visits 36   Date for OT Re-Evaluation 02/02/16   OT Start Time 0103   OT Stop Time 0205   OT Time Calculation (min) 62 min   Activity Tolerance Patient tolerated treatment well;No increased pain   Behavior During Therapy WFL for tasks assessed/performed      Past Medical History:  Diagnosis Date  . COPD (chronic obstructive pulmonary disease) ([place])   . Diabetes mellitus without complication ([place])   . Hyperlipidemia   . Hypertension     No past surgical history on file.  There were no vitals filed for this visit.      Subjective Assessment - 12/01/15 1509    Subjective  Pt presents for Occupational Therapy visit 8 to address BLE lymphedema 2/2 obesity and CVI. Pt reports that his legs are feeling better all the time. He hopes to receive gifted compression stockings this weekend, and agrees not to wear them until we can assess and make sure they are appropriate.   Limitations difficulty walking, decreased standing tolerance, decreased LE sensation,    Patient Stated Goals decrease leg swelling and improve skin condition and keep it from getting worse   Currently in Pain? No/denies   Pain Onset More than a month ago                      OT Treatments/Exercises (OP) - 12/01/15 0001      ADLs   ADL Education Given Yes     Manual Therapy   Manual Therapy Edema management;Manual Lymphatic Drainage (MLD);Compression Bandaging   Edema Management skin care to LLE below knee w/ Eucerin low pH lotion   Manual Lymphatic Drainage (MLD) MLD to LLE as established   Compression Bandaging RLE  compression wraps applied from toes to below knee: Toe wrap omitted todayto reduce bulk so Pt can wear his street shoes home.  Cotton stockinett under  Single layer Rosidal foam from toes to below knee. One short strect wrap, 10 cm x 5 m  to foot and ankle, then 52m  x 5 m x 1,pplied circumferentially on top in custommary layered gradient configuration Will determine if wraps need modification and if Pt needs cast shoe based on assessment of volume reduction and tolerance tomorrow.                 OT Education - 12/01/15 1512    Education provided Yes   Education Details Con tinued Equities trader Education  And LE ADL training throughout visit for lymphedema self care, including compression wrapping, compression garment and device wear/care, lymphatic pumping ther ex, simple self-MLD, and skin care. Discussed progress towards goals.   Person(s) Educated Patient   Methods Explanation   Comprehension Verbalized understanding             OT Long Term Goals - 11/10/15 1712      OT LONG TERM GOAL #1   Title Lymphedema (LE) management/ self-care: Pt able to apply multi layered, gradient compression wraps with min caregiver assistance using proper techniques within 2 weeks to achieve optimal limb volume reduction.  Baseline dependent   Time 2   Period Weeks   Status New     OT LONG TERM GOAL #2   Title Lymphedema (LE) management/ self-care:  Pt to achieve at least 10% LLE limb volume reductions bilaterally during Intensive CDT to limit LE progression, decrease infection and falls risk, to reduce pain/, and to improve safe ambulation and functional mobility.   Baseline dependent   Time 12   Period Weeks   Status New     OT LONG TERM GOAL #3   Baseline dependent   Time 12   Period Weeks   Status New     OT LONG TERM GOAL #4   Title Lymphedema (LE) management/ self-care:  Pt >/= 85 % compliant with all daily, LE self-care protocols for home program w/ needed level of  caregiver assistance , including simple self-manual lymphatic drainage (MLD), skin care, lymphatic pumping the ex, skin care, and donning/ doffing compression wraps and garments o limit LE progression and further functional decline.     Baseline dependent   Time 12   Period Weeks   Status New     OT LONG TERM GOAL #5   Title Lymphedema (LE) management/ self-care:  Pt to tolerate daily compression wraps, garments and devices in keeping w/ prescribed wear regime within 1 week of issue date to progress and retain clinical and functional gains and to limit LE progression.   Baseline dependent   Time 12   Period Weeks   Status New     Long Term Additional Goals   Additional Long Term Goals Yes     OT LONG TERM GOAL #6   Title Lymphedema (LE) self-care:  During Management Phase CDT Pt to sustain limb volume reductions achieved during Intensive Phase CDT within 5% utilizing LE self-care protocols, appropriate compression garments/ devices, and needed level of caregiver assistance.   Baseline dependent   Time 6   Period Months   Status New               Plan - 12/01/15 1513    Clinical Impression Statement Pt continues to demonstrate progress towards goals in terms of decreased RLE swelling, decreased tissue density, and decreased leg pain. Functionally he is ab;le to don socks now using assistive device, and car transfers are slightlt less difficult. Plan is to fit with compression garment on R next week, then shift treatment emphasis to LLE ASAP.   Rehab Potential Good   OT Frequency 3x / week   OT Duration 12 weeks   OT Treatment/Interventions Self-care/ADL training;Therapeutic exercise;Functional Mobility Training;Patient/family education;Manual Therapy;Manual lymph drainage;DME and/or AE instruction;Compression bandaging;Therapeutic activities;Other (comment)  skin care   Consulted and Agree with Plan of Care Patient      Patient will benefit from skilled therapeutic  intervention in order to improve the following deficits and impairments:  Decreased skin integrity, Decreased knowledge of precautions, Decreased activity tolerance, Decreased knowledge of use of DME, Impaired flexibility, Decreased balance, Difficulty walking, Obesity, Decreased range of motion, Increased edema, Pain  Visit Diagnosis: Lymphedema, not elsewhere classified    Problem List Patient Active Problem List   Diagnosis Date Noted  . DIABETES MELLITUS 10/24/2006  . OBESITY 10/24/2006  . HYPERTENSION 10/24/2006  . ALLERGIC RHINITIS 10/24/2006  . CHRONIC OBSTRUCTIVE PULMONARY DISEASE, MODERATE 10/24/2006  . TOBACCO ABUSE, HX OF 10/24/2006   Andrey Spearman, MS, OTR/L, [facility] 12/01/15 3:18 PM  [place] MAIN [facility] SERVICES [place], [place],  [place] Phone: [phone]   Fax:  [phone]  Name: BLAINE HARI MRN: [id number] Date of Birth: [birth year]

## 2015-12-01 NOTE — Patient Instructions (Signed)
LE instructions and precautions as established- see initial eval.   

## 2015-12-03 ENCOUNTER — Ambulatory Visit: Payer: PPO | Attending: Dermatology | Admitting: Occupational Therapy

## 2015-12-03 DIAGNOSIS — I890 Lymphedema, not elsewhere classified: Secondary | ICD-10-CM | POA: Insufficient documentation

## 2015-12-03 NOTE — Therapy (Signed)
[place] MAIN [facility] SERVICES [address] [place], [place], [address] Phone: [phone]   Fax:  [phone]  Occupational Therapy Treatment  Patient Details  Name: Arthur [name] MRN: [id number] Date of Birth: Feb 19, 1933 No Data Recorded  Encounter Date: 12/03/2015      OT End of Session - 12/03/15 1222    Visit Number 9   Number of Visits 36   Date for OT Re-Evaluation 02/02/16   OT Start Time 1000   OT Stop Time 1100   OT Time Calculation (min) 60 min   Activity Tolerance Patient tolerated treatment well;No increased pain   Behavior During Therapy WFL for tasks assessed/performed      Past Medical History:  Diagnosis Date  . COPD (chronic obstructive pulmonary disease) ([place])   . Diabetes mellitus without complication ([place])   . Hyperlipidemia   . Hypertension     No past surgical history on file.  There were no vitals filed for this visit.      Subjective Assessment - 12/03/15 1218    Subjective  Pt presents for Occupational Therapy visit 9 to address BLE lymphedema 2/2 obesity and CVI. Pt has no new complaints. Pt reports he hopes to get compression garments this weekend. Discussed w/ Pt the importance of proper fit and my concerns that garments he purchases thisa weekend may not be in keeping with OT recommendations.   Pertinent History HTN, DM, Obesity,COPD PRECAUTIONS, Hx BLE wounds, Hx falls   Limitations difficulty walking, decreased standing tolerance, decreased LE sensation,    Patient Stated Goals decrease leg swelling and improve skin condition and keep it from getting worse   Currently in Pain? No/denies   Pain Onset More than a month ago                      OT Treatments/Exercises (OP) - 12/03/15 0001      ADLs   ADL Education Given Yes     Manual Therapy   Manual Therapy Edema management;Manual Lymphatic Drainage (MLD);Compression Bandaging   Edema Management skin care to RLE below knee w/  Eucerin low pH lotion   Manual Lymphatic Drainage (MLD) MLD to RLE as established   Compression Bandaging RLE compression wraps applied from toes to below knee: Toe wrap omitted todayto reduce bulk so Pt can wear his street shoes home.  Cotton stockinett under  Single layer Rosidal foam from toes to below knee. One short strect wrap, 10 cm x 5 m  to foot and ankle, then 75m  x 5 m x 1,pplied circumferentially on top in custommary layered gradient configuration Will determine if wraps need modification and if Pt needs cast shoe based on assessment of volume reduction and tolerance tomorrow.                 OT Education - 12/03/15 1221    Education provided Yes   Education Details Con tinued Equities trader Education  And LE ADL training throughout visit for lymphedema self care, including compression wrapping, compression garment and device wear/care, lymphatic pumping ther ex, simple self-MLD, and skin care. Discussed progress towards goals.   Person(s) Educated Patient   Methods Explanation;Demonstration   Comprehension Verbalized understanding;Need further instruction             OT Long Term Goals - 11/10/15 1712      OT LONG TERM GOAL #1   Title Lymphedema (LE) management/ self-care: Pt able to apply multi layered,  gradient compression wraps with min caregiver assistance using proper techniques within 2 weeks to achieve optimal limb volume reduction.   Baseline dependent   Time 2   Period Weeks   Status New     OT LONG TERM GOAL #2   Title Lymphedema (LE) management/ self-care:  Pt to achieve at least 10% LLE limb volume reductions bilaterally during Intensive CDT to limit LE progression, decrease infection and falls risk, to reduce pain/, and to improve safe ambulation and functional mobility.   Baseline dependent   Time 12   Period Weeks   Status New     OT LONG TERM GOAL #3   Baseline dependent   Time 12   Period Weeks   Status New     OT LONG TERM GOAL  #4   Title Lymphedema (LE) management/ self-care:  Pt >/= 85 % compliant with all daily, LE self-care protocols for home program w/ needed level of caregiver assistance , including simple self-manual lymphatic drainage (MLD), skin care, lymphatic pumping the ex, skin care, and donning/ doffing compression wraps and garments o limit LE progression and further functional decline.     Baseline dependent   Time 12   Period Weeks   Status New     OT LONG TERM GOAL #5   Title Lymphedema (LE) management/ self-care:  Pt to tolerate daily compression wraps, garments and devices in keeping w/ prescribed wear regime within 1 week of issue date to progress and retain clinical and functional gains and to limit LE progression.   Baseline dependent   Time 12   Period Weeks   Status New     Long Term Additional Goals   Additional Long Term Goals Yes     OT LONG TERM GOAL #6   Title Lymphedema (LE) self-care:  During Management Phase CDT Pt to sustain limb volume reductions achieved during Intensive Phase CDT within 5% utilizing LE self-care protocols, appropriate compression garments/ devices, and needed level of caregiver assistance.   Baseline dependent   Time 6   Period Months   Status New               Plan - 12/03/15 1223    Clinical Impression Statement RLE well decongeated. Reddened area on shin is minimally changed since commencing CDT. Pt reporting decreased RLE leg pain and discomfrt. Cont as per POC.   Rehab Potential Good   OT Frequency 3x / week   OT Duration 12 weeks   OT Treatment/Interventions Self-care/ADL training;Therapeutic exercise;Functional Mobility Training;Patient/family education;Manual Therapy;Manual lymph drainage;DME and/or AE instruction;Compression bandaging;Therapeutic activities;Other (comment)  skin care   Consulted and Agree with Plan of Care Patient      Patient will benefit from skilled therapeutic intervention in order to improve the following  deficits and impairments:  Decreased skin integrity, Decreased knowledge of precautions, Decreased activity tolerance, Decreased knowledge of use of DME, Impaired flexibility, Decreased balance, Difficulty walking, Obesity, Decreased range of motion, Increased edema, Pain  Visit Diagnosis: Lymphedema, not elsewhere classified    Problem List Patient Active Problem List   Diagnosis Date Noted  . DIABETES MELLITUS 10/24/2006  . OBESITY 10/24/2006  . HYPERTENSION 10/24/2006  . ALLERGIC RHINITIS 10/24/2006  . CHRONIC OBSTRUCTIVE PULMONARY DISEASE, MODERATE 10/24/2006  . TOBACCO ABUSE, HX OF 10/24/2006   Andrey Spearman, MS, OTR/L, [facility] 12/03/15 12:25 PM   [place] MAIN [facility] SERVICES [address] [place], [place], [address] Phone: [phone]   Fax:  [phone]  Name: RASEAN JOOS MRN: [id number] Date of Birth: [birth year]

## 2015-12-06 ENCOUNTER — Ambulatory Visit: Payer: PPO | Admitting: Occupational Therapy

## 2015-12-08 ENCOUNTER — Ambulatory Visit: Payer: PPO | Admitting: Occupational Therapy

## 2015-12-09 ENCOUNTER — Ambulatory Visit: Payer: PPO | Admitting: Occupational Therapy

## 2015-12-09 DIAGNOSIS — I890 Lymphedema, not elsewhere classified: Secondary | ICD-10-CM | POA: Diagnosis not present

## 2015-12-10 ENCOUNTER — Ambulatory Visit: Payer: PPO | Admitting: Occupational Therapy

## 2015-12-10 DIAGNOSIS — I890 Lymphedema, not elsewhere classified: Secondary | ICD-10-CM

## 2015-12-10 NOTE — Patient Instructions (Signed)
Return Mediven A-D ccl 2 SZ LG and exchange for XXL  LE instructions and precautions as established- see initial eval.

## 2015-12-10 NOTE — Therapy (Signed)
[place] MAIN [facility] SERVICES [address] [place], [place], [address] Phone: [phone]   Fax:  [phone]  Occupational Therapy Treatment and Progress Note  Patient Details  Name: Arthur [name] MRN: [id number] Date of Birth: [birth year] No Data Recorded  Encounter Date: 12/10/2015      OT End of Session - 12/10/15 1520    Visit Number 10   Number of Visits 36   Date for OT Re-Evaluation 02/02/16   OT Start Time 1115   OT Stop Time 1152   OT Time Calculation (min) 37 min   Activity Tolerance Patient tolerated treatment well;No increased pain   Behavior During Therapy WFL for tasks assessed/performed      Past Medical History:  Diagnosis Date  . COPD (chronic obstructive pulmonary disease) ([place])   . Diabetes mellitus without complication ([place])   . Hyperlipidemia   . Hypertension     No past surgical history on file.  There were no vitals filed for this visit.      Subjective Assessment - 12/10/15 1217    Subjective  Pt presents for Occupational Therapy visit 10 to address BLE lymphedema 2/2 obesity and CVI. Pt brings Mediven ccl 2 sz LG knee length compression garments to clinic today for fitting.    Pertinent History HTN, DM, Obesity,COPD PRECAUTIONS, Hx BLE wounds, Hx falls   Limitations difficulty walking, decreased standing tolerance, decreased LE sensation,    Patient Stated Goals decrease leg swelling and improve skin condition and keep it from getting worse   Currently in Pain? No/denies   Pain Onset More than a month ago             LYMPHEDEMA/ONCOLOGY QUESTIONNAIRE - 12/10/15 1222      Right Lower Extremity Lymphedema   Other RLE A-D limb volume = 4194.06 ml.    Other RLE A-D limb volume is decreased by 13.26% overall. GOAL EXCEEDED and MET                  OT Treatments/Exercises (OP) - 12/10/15 0001      ADLs   ADL Education Given Yes     Manual Therapy   Manual Therapy Edema  management;Compression Bandaging;Manual Lymphatic Drainage (MLD)   Manual therapy comments RLE comparative limb volumetrics. fitted Mediven ccl 2 A-D stockings- sz large too small. Remeasured.   Edema Management skin care to RLE below knee w/ Eucerin low pH lotion   Manual Lymphatic Drainage (MLD) MLD to RLE as established   Compression Bandaging RLE compression wraps applied from toes to below knee: Toe wrap omitted todayto reduce bulk so Pt can wear his street shoes home.  Cotton stockinett under  Single layer Rosidal foam from toes to below knee. One short strect wrap, 10 cm x 5 m  to foot and ankle, then 66m x 5 m x 1,pplied circumferentially on top in custommary layered gradient configuration Will determine if wraps need modification and if Pt needs cast shoe based on assessment of volume reduction and tolerance tomorrow.                 OT Education - 12/10/15 1220    Education provided Yes   Education Details Pt edu for LE self care continued w/ emphasis on compression garment wear and care, fit and function, and assistive devices for donning/ doffing   Person(s) Educated Patient   Methods Explanation;Demonstration   Comprehension Verbalized understanding;Returned demonstration;Need further instruction  OT Long Term Goals - December 16, 2015 1227      OT LONG TERM GOAL #1   Title Lymphedema (LE) management/ self-care: Pt able to apply multi layered, gradient compression wraps with min caregiver assistance using proper techniques within 2 weeks to achieve optimal limb volume reduction.   Baseline dependent   Time 2   Period Weeks   Status Achieved     OT LONG TERM GOAL #2   Title Lymphedema (LE) management/ self-care:  Pt to achieve at least 10% LLE limb volume reductions bilaterally during Intensive CDT to limit LE progression, decrease infection and falls risk, to reduce pain/, and to improve safe ambulation and functional mobility.   Baseline dependent   Time 12    Period Weeks   Status Partially Met     OT LONG TERM GOAL #4   Title Lymphedema (LE) management/ self-care:  Pt >/= 85 % compliant with all daily, LE self-care protocols for home program w/ needed level of caregiver assistance , including simple self-manual lymphatic drainage (MLD), skin care, lymphatic pumping the ex, skin care, and donning/ doffing compression wraps and garments o limit LE progression and further functional decline.     Baseline dependent   Time 12   Status Achieved     OT LONG TERM GOAL #5   Title Lymphedema (LE) management/ self-care:  Pt to tolerate daily compression wraps, garments and devices in keeping w/ prescribed wear regime within 1 week of issue date to progress and retain clinical and functional gains and to limit LE progression.   Baseline dependent   Time 12   Period Weeks   Status Partially Met     OT LONG TERM GOAL #6   Title Lymphedema (LE) self-care:  During Management Phase CDT Pt to sustain limb volume reductions achieved during Intensive Phase CDT within 5% utilizing LE self-care protocols, appropriate compression garments/ devices, and needed level of caregiver assistance.   Baseline dependent   Period Months   Status On-going               Plan - 12-16-15 1521    Clinical Impression Statement XXL compression garments fit properly despite being a size larger than measurements indicate. After skilled training Pt unable to don and doff garments without max A. Pt will work with son over the weekend  to train him to assist. Musician.   Rehab Potential Good   OT Frequency 3x / week   OT Duration 12 weeks   OT Treatment/Interventions Self-care/ADL training;Therapeutic exercise;Functional Mobility Training;Patient/family education;Manual Therapy;Manual lymph drainage;DME and/or AE instruction;Compression bandaging;Therapeutic activities;Other (comment)  skin care   Consulted and Agree with Plan of Care Patient      Patient  will benefit from skilled therapeutic intervention in order to improve the following deficits and impairments:  Decreased skin integrity, Decreased knowledge of precautions, Decreased activity tolerance, Decreased knowledge of use of DME, Impaired flexibility, Decreased balance, Difficulty walking, Obesity, Decreased range of motion, Increased edema, Pain  Visit Diagnosis: Lymphedema, not elsewhere classified      G-Codes - 12/16/2015 1518    Functional Assessment Tool Used Clinical observation, physical examination, medical records review for H&P, Pt and caregiver interview, comparative limb volumetrics   Functional Limitation Self care   Self Care Current Status (G8366) At least 40 percent but less than 60 percent impaired, limited or restricted   Self Care Goal Status (Q9476) At least 1 percent but less than 20 percent impaired, limited or restricted  Problem List Patient Active Problem List   Diagnosis Date Noted  . DIABETES MELLITUS 10/24/2006  . OBESITY 10/24/2006  . HYPERTENSION 10/24/2006  . ALLERGIC RHINITIS 10/24/2006  . CHRONIC OBSTRUCTIVE PULMONARY DISEASE, MODERATE 10/24/2006  . TOBACCO ABUSE, HX OF 10/24/2006    Andrey Spearman, MS, OTR/L, [facility] 12/10/15 3:25 PM  [place] MAIN [facility] SERVICES [address] [place], [place], [address] Phone: [phone]   Fax:  [phone]  Name: Arthur [name] MRN: [id number] Date of Birth: 09/01/1933

## 2015-12-10 NOTE — Therapy (Signed)
[place] MAIN [facility] SERVICES [address] [place], [place], [address] Phone: [phone]   Fax:  [phone]  Occupational Therapy Treatment  Patient Details  Name: Arthur [name] MRN: [id number] Date of Birth: 1933/01/25 No Data Recorded  Encounter Date: 12/09/2015      OT End of Session - 12/10/15 1220    Visit Number 10   Number of Visits 36   Date for OT Re-Evaluation 02/02/16   OT Start Time 1113   OT Stop Time 1215   OT Time Calculation (min) 62 min   Activity Tolerance Patient tolerated treatment well;No increased pain   Behavior During Therapy WFL for tasks assessed/performed      Past Medical History:  Diagnosis Date  . COPD (chronic obstructive pulmonary disease) ([place])   . Diabetes mellitus without complication ([place])   . Hyperlipidemia   . Hypertension     No past surgical history on file.  There were no vitals filed for this visit.      Subjective Assessment - 12/10/15 1217    Subjective  Pt presents for Occupational Therapy visit 10 to address BLE lymphedema 2/2 obesity and CVI. Pt brings Mediven ccl 2 sz LG knee length compression garments to clinic today for fitting.    Pertinent History HTN, DM, Obesity,COPD PRECAUTIONS, Hx BLE wounds, Hx falls   Limitations difficulty walking, decreased standing tolerance, decreased LE sensation,    Patient Stated Goals decrease leg swelling and improve skin condition and keep it from getting worse   Currently in Pain? No/denies   Pain Onset More than a month ago             LYMPHEDEMA/ONCOLOGY QUESTIONNAIRE - 12/10/15 1222      Right Lower Extremity Lymphedema   Other RLE A-D limb volume = 4194.06 ml.    Other RLE A-D limb volume is decreased by 13.26% overall. GOAL EXCEEDED and MET                  OT Treatments/Exercises (OP) - 12/10/15 0001      ADLs   ADL Education Given Yes     Manual Therapy   Manual Therapy Edema management;Compression  Bandaging;Manual Lymphatic Drainage (MLD)   Manual therapy comments RLE comparative limb volumetrics. fitted Mediven ccl 2 A-D stockings- sz large too small. Remeasured.   Edema Management skin care to RLE below knee w/ Eucerin low pH lotion   Manual Lymphatic Drainage (MLD) MLD to RLE as established   Compression Bandaging RLE compression wraps applied from toes to below knee: Toe wrap omitted todayto reduce bulk so Pt can wear his street shoes home.  Cotton stockinett under  Single layer Rosidal foam from toes to below knee. One short strect wrap, 10 cm x 5 m  to foot and ankle, then 76m x 5 m x 1,pplied circumferentially on top in custommary layered gradient configuration Will determine if wraps need modification and if Pt needs cast shoe based on assessment of volume reduction and tolerance tomorrow.                 OT Education - 12/10/15 1220    Education provided Yes   Education Details Pt edu for LE self care continued w/ emphasis on compression garment wear and care, fit and function, and assistive devices for donning/ doffing   Person(s) Educated Patient   Methods Explanation;Demonstration   Comprehension Verbalized understanding;Returned demonstration;Need further instruction  OT Long Term Goals - December 21, 2015 1227      OT LONG TERM GOAL #1   Title Lymphedema (LE) management/ self-care: Pt able to apply multi layered, gradient compression wraps with min caregiver assistance using proper techniques within 2 weeks to achieve optimal limb volume reduction.   Baseline dependent   Time 2   Period Weeks   Status Achieved     OT LONG TERM GOAL #2   Title Lymphedema (LE) management/ self-care:  Pt to achieve at least 10% LLE limb volume reductions bilaterally during Intensive CDT to limit LE progression, decrease infection and falls risk, to reduce pain/, and to improve safe ambulation and functional mobility.   Baseline dependent   Time 12   Period Weeks    Status Partially Met     OT LONG TERM GOAL #4   Title Lymphedema (LE) management/ self-care:  Pt >/= 85 % compliant with all daily, LE self-care protocols for home program w/ needed level of caregiver assistance , including simple self-manual lymphatic drainage (MLD), skin care, lymphatic pumping the ex, skin care, and donning/ doffing compression wraps and garments o limit LE progression and further functional decline.     Baseline dependent   Time 12   Status Achieved     OT LONG TERM GOAL #5   Title Lymphedema (LE) management/ self-care:  Pt to tolerate daily compression wraps, garments and devices in keeping w/ prescribed wear regime within 1 week of issue date to progress and retain clinical and functional gains and to limit LE progression.   Baseline dependent   Time 12   Period Weeks   Status Partially Met     OT LONG TERM GOAL #6   Title Lymphedema (LE) self-care:  During Management Phase CDT Pt to sustain limb volume reductions achieved during Intensive Phase CDT within 5% utilizing LE self-care protocols, appropriate compression garments/ devices, and needed level of caregiver assistance.   Baseline dependent   Period Months   Status On-going               Plan - 12/21/15 03-11-19    Clinical Impression Statement RLE comparative limb volumetrics reveal RLE has decreased additionally below the knee in volume since last measured on 11/11/2015. Todate volume reduction measures 13.26%, which meets and exceeds the initial 10% goal. Attmpted to fit ccl 2 OTS AD compression garments, but they are too small. The garments Pt brings to clinic today are not the recommended garments. Completed repeat anatomical measurements and recommended Pt get a size larger than what he measures for today to assis w/ donning and doffing.     Rehab Potential Good   OT Frequency 3x / week   OT Duration 12 weeks   OT Treatment/Interventions Self-care/ADL training;Therapeutic exercise;Functional Mobility  Training;Patient/family education;Manual Therapy;Manual lymph drainage;DME and/or AE instruction;Compression bandaging;Therapeutic activities;Other (comment)  skin care   Consulted and Agree with Plan of Care Patient      Patient will benefit from skilled therapeutic intervention in order to improve the following deficits and impairments:  Decreased skin integrity, Decreased knowledge of precautions, Decreased activity tolerance, Decreased knowledge of use of DME, Impaired flexibility, Decreased balance, Difficulty walking, Obesity, Decreased range of motion, Increased edema, Pain  Visit Diagnosis: Lymphedema, not elsewhere classified      G-Codes - 12/21/15 1226-03-10    Functional Assessment Tool Used Clinical observation, physical examination, medical records review for H&P, Pt and caregiver interview, comparative limb volumetrics   Functional Limitation Self care   Self  Care Current Status [phone]) At least 40 percent but less than 60 percent impaired, limited or restricted   Self Care Goal Status (Y3836) At least 1 percent but less than 20 percent impaired, limited or restricted      Problem List Patient Active Problem List   Diagnosis Date Noted  . DIABETES MELLITUS 10/24/2006  . OBESITY 10/24/2006  . HYPERTENSION 10/24/2006  . ALLERGIC RHINITIS 10/24/2006  . CHRONIC OBSTRUCTIVE PULMONARY DISEASE, MODERATE 10/24/2006  . TOBACCO ABUSE, HX OF 10/24/2006    Andrey Spearman, MS, OTR/L, [facility] 12/10/15 2:41 PM  [place] MAIN [facility] SERVICES [address] [place], [place], [address] Phone: [phone]   Fax:  [phone]  Name: Arthur [name] MRN: [id number] Date of Birth: January 11, 1933

## 2015-12-13 ENCOUNTER — Ambulatory Visit: Payer: PPO | Admitting: Occupational Therapy

## 2015-12-15 ENCOUNTER — Ambulatory Visit: Payer: PPO | Admitting: Occupational Therapy

## 2015-12-16 ENCOUNTER — Ambulatory Visit: Payer: PPO | Admitting: Occupational Therapy

## 2015-12-16 DIAGNOSIS — I890 Lymphedema, not elsewhere classified: Secondary | ICD-10-CM

## 2015-12-16 NOTE — Patient Instructions (Signed)
LE instructions and precautions as established- see initial eval.   

## 2015-12-16 NOTE — Therapy (Signed)
[place] MAIN [facility] SERVICES [address] [place], [place], [address] Phone: [phone]   Fax:  [phone]  Occupational Therapy Treatment  Patient Details  Name: Arthur [name] MRN: [id number] Date of Birth: [birth year] No Data Recorded  Encounter Date: 12/16/2015      OT End of Session - 12/16/15 1210    Visit Number 11   Number of Visits 36   Date for OT Re-Evaluation 02/02/16   OT Start Time 1100   OT Stop Time 1200   OT Time Calculation (min) 60 min   Activity Tolerance Patient tolerated treatment well;No increased pain   Behavior During Therapy WFL for tasks assessed/performed      Past Medical History:  Diagnosis Date  . COPD (chronic obstructive pulmonary disease) ([place])   . Diabetes mellitus without complication ([place])   . Hyperlipidemia   . Hypertension     No past surgical history on file.  There were no vitals filed for this visit.      Subjective Assessment - 12/16/15 1100    Subjective  Pt presents for Occupational Therapy visit 11 to address BLE lymphedema 2/2 obesity and CVI. No new complaints.   Pertinent History HTN, DM, Obesity,COPD PRECAUTIONS, Hx BLE wounds, Hx falls   Limitations difficulty walking, decreased standing tolerance, decreased LE sensation,    Patient Stated Goals decrease leg swelling and improve skin condition and keep it from getting worse   Currently in Pain? No/denies   Pain Onset More than a month ago                      OT Treatments/Exercises (OP) - 12/16/15 0001      ADLs   ADL Education Given Yes     Manual Therapy   Manual Therapy Edema management;Compression Bandaging;Manual Lymphatic Drainage (MLD)   Edema Management skin care to LLE below knee w/ Eucerin low pH lotion   Manual Lymphatic Drainage (MLD) MLD to LLE as established   Compression Bandaging LLE compression wraps applied from toes to below knee: Toe wrap omitted todayto reduce bulk so Pt can wear  his street shoes home.  Cotton stockinett under  Single layer Rosidal foam from toes to below knee. One short strect wrap, 10 cm x 5 m  to foot and ankle, then 24m x 5 m x 1,pplied circumferentially on top in custommary layered gradient configuration Will determine if wraps need modification and if Pt needs cast shoe based on assessment of volume reduction and tolerance tomorrow.                 OT Education - 12/16/15 1209    Education provided Yes   Education Details Emphasis on LE self care   Person(s) Educated Patient   Methods Explanation   Comprehension Verbalized understanding             OT Long Term Goals - 12/10/15 1227      OT LONG TERM GOAL #1   Title Lymphedema (LE) management/ self-care: Pt able to apply multi layered, gradient compression wraps with min caregiver assistance using proper techniques within 2 weeks to achieve optimal limb volume reduction.   Baseline dependent   Time 2   Period Weeks   Status Achieved     OT LONG TERM GOAL #2   Title Lymphedema (LE) management/ self-care:  Pt to achieve at least 10% LLE limb volume reductions bilaterally during Intensive CDT to limit LE progression, decrease  infection and falls risk, to reduce pain/, and to improve safe ambulation and functional mobility.   Baseline dependent   Time 12   Period Weeks   Status Partially Met     OT LONG TERM GOAL #4   Title Lymphedema (LE) management/ self-care:  Pt >/= 85 % compliant with all daily, LE self-care protocols for home program w/ needed level of caregiver assistance , including simple self-manual lymphatic drainage (MLD), skin care, lymphatic pumping the ex, skin care, and donning/ doffing compression wraps and garments o limit LE progression and further functional decline.     Baseline dependent   Time 12   Status Achieved     OT LONG TERM GOAL #5   Title Lymphedema (LE) management/ self-care:  Pt to tolerate daily compression wraps, garments and devices in  keeping w/ prescribed wear regime within 1 week of issue date to progress and retain clinical and functional gains and to limit LE progression.   Baseline dependent   Time 12   Period Weeks   Status Partially Met     OT LONG TERM GOAL #6   Title Lymphedema (LE) self-care:  During Management Phase CDT Pt to sustain limb volume reductions achieved during Intensive Phase CDT within 5% utilizing LE self-care protocols, appropriate compression garments/ devices, and needed level of caregiver assistance.   Baseline dependent   Period Months   Status On-going               Plan - 12/16/15 1210    Clinical Impression Statement Commence CDT to LLE today. Pt tolerated MLD, skin care and compression wraps without difficulty. Requires Max A from adult children to don RLE knee high garment. Cont as per POC.   Rehab Potential Good   OT Frequency 3x / week   OT Duration 12 weeks   OT Treatment/Interventions Self-care/ADL training;Therapeutic exercise;Functional Mobility Training;Patient/family education;Manual Therapy;Manual lymph drainage;DME and/or AE instruction;Compression bandaging;Therapeutic activities;Other (comment)  skin care   Consulted and Agree with Plan of Care Patient      Patient will benefit from skilled therapeutic intervention in order to improve the following deficits and impairments:  Decreased skin integrity, Decreased knowledge of precautions, Decreased activity tolerance, Decreased knowledge of use of DME, Impaired flexibility, Decreased balance, Difficulty walking, Obesity, Decreased range of motion, Increased edema, Pain  Visit Diagnosis: Lymphedema, not elsewhere classified    Problem List Patient Active Problem List   Diagnosis Date Noted  . DIABETES MELLITUS 10/24/2006  . OBESITY 10/24/2006  . HYPERTENSION 10/24/2006  . ALLERGIC RHINITIS 10/24/2006  . CHRONIC OBSTRUCTIVE PULMONARY DISEASE, MODERATE 10/24/2006  . TOBACCO ABUSE, HX OF 10/24/2006     Andrey Spearman, MS, OTR/L, [facility] 12/16/15 12:12 PM  [place] [address] [place], [place], [address] Phone: [phone]   Fax:  [phone]  Name: Arthur [name] MRN: [id number] Date of Birth: 1933/03/22

## 2015-12-17 ENCOUNTER — Ambulatory Visit: Payer: PPO | Admitting: Occupational Therapy

## 2015-12-20 ENCOUNTER — Ambulatory Visit: Payer: PPO | Admitting: Occupational Therapy

## 2015-12-22 ENCOUNTER — Ambulatory Visit: Payer: PPO | Admitting: Occupational Therapy

## 2015-12-23 ENCOUNTER — Ambulatory Visit: Payer: PPO | Admitting: Occupational Therapy

## 2015-12-23 DIAGNOSIS — I890 Lymphedema, not elsewhere classified: Secondary | ICD-10-CM

## 2015-12-23 NOTE — Patient Instructions (Signed)
LE instructions and precautions as established- see initial eval.   

## 2015-12-23 NOTE — Therapy (Signed)
[place] MAIN [facility] SERVICES [address] [place], [place], [address] Phone: [phone]   Fax:  [phone]  Patient Details  Name: Arthur [name] MRN: [id number] Date of Birth: 11/06/1933 Referring Provider:  Brendolyn Patty, MD  Encounter Date: 12/23/2015  Andrey Spearman, MS, OTR/L, [facility] 12/23/15 12:22 PM   [place] MAIN [facility] SERVICES [address] [place], [place], [address] Phone: [phone]   Fax:  [phone]

## 2015-12-24 ENCOUNTER — Ambulatory Visit: Payer: PPO | Admitting: Occupational Therapy

## 2015-12-24 DIAGNOSIS — I890 Lymphedema, not elsewhere classified: Secondary | ICD-10-CM

## 2015-12-24 NOTE — Therapy (Signed)
[place] MAIN [facility] SERVICES [address] [place], [place], [address] Phone: [phone]   Fax:  [phone]  Occupational Therapy Treatment  Patient Details  Name: Arthur [name] MRN: [id number] Date of Birth: 28-Apr-1933 No Data Recorded  Encounter Date: 12/24/2015      OT End of Session - 12/24/15 1519    Visit Number 13   Number of Visits 36   Date for OT Re-Evaluation 02/02/16   OT Start Time 1110   OT Stop Time 1210   OT Time Calculation (min) 60 min   Activity Tolerance Patient tolerated treatment well;No increased pain   Behavior During Therapy WFL for tasks assessed/performed      Past Medical History:  Diagnosis Date  . COPD (chronic obstructive pulmonary disease) ([place])   . Diabetes mellitus without complication ([place])   . Hyperlipidemia   . Hypertension     No past surgical history on file.  There were no vitals filed for this visit.      Subjective Assessment - 12/24/15 1517    Subjective  Pt presents for Occupational Therapy visit 13 to address BLE lymphedema. Pt is accompanied by his grandson  again today. Pt presents w/ compression wraps in place on LLE.   Pertinent History HTN, DM, Obesity,COPD PRECAUTIONS, Hx BLE wounds, Hx falls   Limitations difficulty walking, decreased standing tolerance, decreased LE sensation,    Patient Stated Goals decrease leg swelling and improve skin condition and keep it from getting worse   Currently in Pain? No/denies   Pain Onset More than a month ago                      OT Treatments/Exercises (OP) - 12/24/15 0001      ADLs   ADL Education Given Yes     Manual Therapy   Manual Therapy Edema management;Compression Bandaging;Manual Lymphatic Drainage (MLD)   Edema Management skin care to LLE below knee w/ Eucerin low pH lotion   Manual Lymphatic Drainage (MLD) MLD to LLE as established   Compression Bandaging Fit LLE w/ compression stocking today. Bandaging  complete for BLE.                OT Education - 12/24/15 1519    Education provided Yes   Education Details Con tinued Equities trader Education  And LE ADL training throughout visit for lymphedema self care, including compression wrapping, compression garment and device wear/care, lymphatic pumping ther ex, simple self-MLD, and skin care. Discussed progress towards goals.   Person(s) Educated Patient   Methods Explanation   Comprehension Verbalized understanding             OT Long Term Goals - 12/10/15 1227      OT LONG TERM GOAL #1   Title Lymphedema (LE) management/ self-care: Pt able to apply multi layered, gradient compression wraps with min caregiver assistance using proper techniques within 2 weeks to achieve optimal limb volume reduction.   Baseline dependent   Time 2   Period Weeks   Status Achieved     OT LONG TERM GOAL #2   Title Lymphedema (LE) management/ self-care:  Pt to achieve at least 10% LLE limb volume reductions bilaterally during Intensive CDT to limit LE progression, decrease infection and falls risk, to reduce pain/, and to improve safe ambulation and functional mobility.   Baseline dependent   Time 12   Period Weeks   Status Partially Met  OT LONG TERM GOAL #4   Title Lymphedema (LE) management/ self-care:  Pt >/= 85 % compliant with all daily, LE self-care protocols for home program w/ needed level of caregiver assistance , including simple self-manual lymphatic drainage (MLD), skin care, lymphatic pumping the ex, skin care, and donning/ doffing compression wraps and garments o limit LE progression and further functional decline.     Baseline dependent   Time 12   Status Achieved     OT LONG TERM GOAL #5   Title Lymphedema (LE) management/ self-care:  Pt to tolerate daily compression wraps, garments and devices in keeping w/ prescribed wear regime within 1 week of issue date to progress and retain clinical and functional gains and to  limit LE progression.   Baseline dependent   Time 12   Period Weeks   Status Partially Met     OT LONG TERM GOAL #6   Title Lymphedema (LE) self-care:  During Management Phase CDT Pt to sustain limb volume reductions achieved during Intensive Phase CDT within 5% utilizing LE self-care protocols, appropriate compression garments/ devices, and needed level of caregiver assistance.   Baseline dependent   Period Months   Status On-going               Plan - 12/24/15 1519    Clinical Impression Statement Able to DC compression wraps after session today and assist Pt w/ donning compression garmsnt on LLE. Pt now fitting into garments on both legs and he transitions from Intensive Phase  to Management Phase of CDT. Pt in agreement w/ plan to call w/ questions as we increase visit interval to 1 week.   Rehab Potential Good   OT Frequency 1x / week   OT Duration Other (comment)  Return for 1 week followup to check initial progress towards management phase goals and check limb volumetrics bilaterall. If managing w/in goal range Pt to F/U in 1 moth, then DC OT.   OT Treatment/Interventions Self-care/ADL training;Therapeutic exercise;Functional Mobility Training;Patient/family education;Manual Therapy;Manual lymph drainage;DME and/or AE instruction;Compression bandaging;Therapeutic activities;Other (comment)  skin care   Consulted and Agree with Plan of Care Patient      Patient will benefit from skilled therapeutic intervention in order to improve the following deficits and impairments:  Decreased skin integrity, Decreased knowledge of precautions, Decreased activity tolerance, Decreased knowledge of use of DME, Impaired flexibility, Decreased balance, Difficulty walking, Obesity, Decreased range of motion, Increased edema, Pain  Visit Diagnosis: Lymphedema, not elsewhere classified    Problem List Patient Active Problem List   Diagnosis Date Noted  . DIABETES MELLITUS 10/24/2006  .  OBESITY 10/24/2006  . HYPERTENSION 10/24/2006  . ALLERGIC RHINITIS 10/24/2006  . CHRONIC OBSTRUCTIVE PULMONARY DISEASE, MODERATE 10/24/2006  . TOBACCO ABUSE, HX OF 10/24/2006    Andrey Spearman, MS, OTR/L, [facility] 12/24/15 3:23 PM  [place] MAIN [facility] SERVICES [address] [place], [place], [address] Phone: [phone]   Fax:  [phone]  Name: EDUARDO HONOR MRN: [id number] Date of Birth: 01-04-1933

## 2015-12-29 ENCOUNTER — Ambulatory Visit: Payer: PPO | Admitting: Occupational Therapy

## 2015-12-31 ENCOUNTER — Ambulatory Visit: Payer: PPO | Admitting: Occupational Therapy

## 2015-12-31 DIAGNOSIS — I890 Lymphedema, not elsewhere classified: Secondary | ICD-10-CM | POA: Diagnosis not present

## 2015-12-31 NOTE — Therapy (Signed)
[place] MAIN [facility] SERVICES [address] [place], [place], [address] Phone: [phone]   Fax:  [phone]  Occupational Therapy Treatment  Patient Details  Name: Arthur [name] MRN: [id number] Date of Birth: 1933-05-23 No Data Recorded  Encounter Date: 12/31/2015      OT End of Session - 12/31/15 1411    Visit Number 14   Number of Visits 36   Date for OT Re-Evaluation 02/02/16   OT Start Time 1010   OT Stop Time 1058   OT Time Calculation (min) 48 min      Past Medical History:  Diagnosis Date  . COPD (chronic obstructive pulmonary disease) ([place])   . Diabetes mellitus without complication ([place])   . Hyperlipidemia   . Hypertension     No past surgical history on file.  There were no vitals filed for this visit.      Subjective Assessment - 12/31/15 1221    Subjective  Pt presents for Occupational Therapy visit 14 tafter 1 week visit interval as he transitions into Management Phase of CDT to address BLE lymphedema. Pty brings gifted compression socks to clinic for assessment.   Pertinent History HTN, DM, Obesity,COPD PRECAUTIONS, Hx BLE wounds, Hx falls   Limitations difficulty walking, decreased standing tolerance, decreased LE sensation,    Patient Stated Goals decrease leg swelling and improve skin condition and keep it from getting worse   Currently in Pain? No/denies   Pain Onset More than a month ago                      OT Treatments/Exercises (OP) - 12/31/15 0001      ADLs   ADL Education Given Yes     Manual Therapy   Manual Therapy Edema management;Other (comment)  compression garment assessment                OT Education - 12/31/15 1223    Education provided Yes   Education Details (P)  Emphasis of today's session on Pt edu for LE self care. Reviewed compression garment care and use, including use of lighter duty garments on days when Pt is primarily sedentary , and 30-40- mmHg  class 2 knee highs when more active and ambulatory.    Person(s) Educated (P)  Patient   Methods (P)  Explanation;Demonstration   Comprehension (P)  Verbalized understanding             OT Long Term Goals - 12/10/15 1227      OT LONG TERM GOAL #1   Title Lymphedema (LE) management/ self-care: Pt able to apply multi layered, gradient compression wraps with min caregiver assistance using proper techniques within 2 weeks to achieve optimal limb volume reduction.   Baseline dependent   Time 2   Period Weeks   Status Achieved     OT LONG TERM GOAL #2   Title Lymphedema (LE) management/ self-care:  Pt to achieve at least 10% LLE limb volume reductions bilaterally during Intensive CDT to limit LE progression, decrease infection and falls risk, to reduce pain/, and to improve safe ambulation and functional mobility.   Baseline dependent   Time 12   Period Weeks   Status Partially Met     OT LONG TERM GOAL #4   Title Lymphedema (LE) management/ self-care:  Pt >/= 85 % compliant with all daily, LE self-care protocols for home program w/ needed level of caregiver assistance , including simple self-manual lymphatic drainage (  MLD), skin care, lymphatic pumping the ex, skin care, and donning/ doffing compression wraps and garments o limit LE progression and further functional decline.     Baseline dependent   Time 12   Status Achieved     OT LONG TERM GOAL #5   Title Lymphedema (LE) management/ self-care:  Pt to tolerate daily compression wraps, garments and devices in keeping w/ prescribed wear regime within 1 week of issue date to progress and retain clinical and functional gains and to limit LE progression.   Baseline dependent   Time 12   Period Weeks   Status Partially Met     OT LONG TERM GOAL #6   Title Lymphedema (LE) self-care:  During Management Phase CDT Pt to sustain limb volume reductions achieved during Intensive Phase CDT within 5% utilizing LE self-care protocols,  appropriate compression garments/ devices, and needed level of caregiver assistance.   Baseline dependent   Period Months   Status On-going               Plan - 12/31/15 1412    Clinical Impression Statement Pt returns today after 1 week visit interval for first follow up since transitioning to Management Phase of CDT. BLE LE appears well managed. Adult children continue to assist w/ home program for skin care and donning/doffing compression garments. Pt reports less pain and discomfort in his legs. Pt  brings several pairs of 2nd hand compression stockings for assessment today. All are in good condition, although provide lower than optimal compression for this patient when he is very active. Pt OK'd to use lighter duty garments on days when he is sedentary as long as he keeps feet elevated when seated. Pt is managing very well. He agrees w/ plan to return in  1 month for F/U, and call PN.    Rehab Potential Good   OT Frequency Monthly   OT Duration Other (comment)  Return for 1 week followup to check initial progress towards management phase goals and check limb volumetrics bilaterall. If managing w/in goal range Pt to F/U in 1 moth, then DC OT.   OT Treatment/Interventions Self-care/ADL training;Therapeutic exercise;Functional Mobility Training;Patient/family education;Manual Therapy;Manual lymph drainage;DME and/or AE instruction;Compression bandaging;Therapeutic activities;Other (comment)  skin care   Consulted and Agree with Plan of Care Patient      Patient will benefit from skilled therapeutic intervention in order to improve the following deficits and impairments:  Decreased skin integrity, Decreased knowledge of precautions, Decreased activity tolerance, Decreased knowledge of use of DME, Impaired flexibility, Decreased balance, Difficulty walking, Obesity, Decreased range of motion, Increased edema, Pain  Visit Diagnosis: Lymphedema, not elsewhere classified    Problem  List Patient Active Problem List   Diagnosis Date Noted  . DIABETES MELLITUS 10/24/2006  . OBESITY 10/24/2006  . HYPERTENSION 10/24/2006  . ALLERGIC RHINITIS 10/24/2006  . CHRONIC OBSTRUCTIVE PULMONARY DISEASE, MODERATE 10/24/2006  . TOBACCO ABUSE, HX OF 10/24/2006   Andrey Spearman, MS, OTR/L, [facility] 12/31/15 2:19 PM   [place] MAIN [facility] SERVICES [address] [place], [place], [address] Phone: [phone]   Fax:  [phone]  Name: RAWN QUIROA MRN: [id number] Date of Birth: Dec 30, 1933

## 2015-12-31 NOTE — Patient Instructions (Signed)

## 2016-01-05 ENCOUNTER — Ambulatory Visit: Payer: PPO | Admitting: Occupational Therapy

## 2016-01-07 ENCOUNTER — Ambulatory Visit: Payer: PPO | Admitting: Occupational Therapy

## 2016-01-10 ENCOUNTER — Ambulatory Visit: Payer: PPO | Admitting: Occupational Therapy

## 2016-01-11 DIAGNOSIS — L821 Other seborrheic keratosis: Secondary | ICD-10-CM | POA: Diagnosis not present

## 2016-01-11 DIAGNOSIS — L820 Inflamed seborrheic keratosis: Secondary | ICD-10-CM | POA: Diagnosis not present

## 2016-01-11 DIAGNOSIS — I8311 Varicose veins of right lower extremity with inflammation: Secondary | ICD-10-CM | POA: Diagnosis not present

## 2016-01-11 DIAGNOSIS — I8312 Varicose veins of left lower extremity with inflammation: Secondary | ICD-10-CM | POA: Diagnosis not present

## 2016-01-12 ENCOUNTER — Ambulatory Visit: Payer: PPO | Admitting: Occupational Therapy

## 2016-01-14 ENCOUNTER — Ambulatory Visit: Payer: PPO | Admitting: Occupational Therapy

## 2016-01-15 DIAGNOSIS — E119 Type 2 diabetes mellitus without complications: Secondary | ICD-10-CM | POA: Diagnosis not present

## 2016-01-17 ENCOUNTER — Ambulatory Visit: Payer: PPO | Admitting: Occupational Therapy

## 2016-01-18 DIAGNOSIS — E1121 Type 2 diabetes mellitus with diabetic nephropathy: Secondary | ICD-10-CM | POA: Diagnosis not present

## 2016-01-18 DIAGNOSIS — I10 Essential (primary) hypertension: Secondary | ICD-10-CM | POA: Diagnosis not present

## 2016-01-18 DIAGNOSIS — Z794 Long term (current) use of insulin: Secondary | ICD-10-CM | POA: Diagnosis not present

## 2016-01-19 ENCOUNTER — Ambulatory Visit: Payer: PPO | Admitting: Occupational Therapy

## 2016-01-21 ENCOUNTER — Ambulatory Visit: Payer: PPO | Admitting: Occupational Therapy

## 2016-01-26 ENCOUNTER — Ambulatory Visit: Payer: PPO | Admitting: Occupational Therapy

## 2016-01-28 ENCOUNTER — Ambulatory Visit: Payer: PPO | Admitting: Occupational Therapy

## 2016-01-31 ENCOUNTER — Ambulatory Visit: Payer: PPO | Admitting: Occupational Therapy

## 2016-02-07 ENCOUNTER — Ambulatory Visit: Payer: PPO | Admitting: Occupational Therapy

## 2016-02-09 ENCOUNTER — Ambulatory Visit: Payer: PPO | Admitting: Occupational Therapy

## 2016-02-11 ENCOUNTER — Ambulatory Visit: Payer: PPO | Admitting: Occupational Therapy

## 2016-02-11 ENCOUNTER — Ambulatory Visit: Payer: PPO | Attending: Dermatology | Admitting: Occupational Therapy

## 2016-02-11 DIAGNOSIS — I890 Lymphedema, not elsewhere classified: Secondary | ICD-10-CM

## 2016-02-11 NOTE — Therapy (Signed)
[place] MAIN [facility] SERVICES [address] [place], [place], [address] Phone: [phone]   Fax:  [phone]  Occupational Therapy Treatment Note, Progress Report and Discharge Summary  Patient Details  Name: NEEL BUFFONE MRN: [id number] Date of Birth: [birth year] No Data Recorded  Encounter Date: 02/11/2016      OT End of Session - 02/11/16 1108    Visit Number 15   Number of Visits 36   Date for OT Re-Evaluation 02/02/16   OT Start Time 1005   OT Stop Time 1045   OT Time Calculation (min) 40 min   Equipment Utilized During Treatment Mediven big butler donning aide, Juzo friction mat, Juzo paper sock   Activity Tolerance Patient tolerated treatment well;No increased pain   Behavior During Therapy WFL for tasks assessed/performed      Past Medical History:  Diagnosis Date  . COPD (chronic obstructive pulmonary disease) ([place])   . Diabetes mellitus without complication ([place])   . Hyperlipidemia   . Hypertension     No past surgical history on file.  There were no vitals filed for this visit.      Subjective Assessment - 02/11/16 1102    Subjective  Pt presents for Occupational Therapy visit 15 visit  for BLE lymphedema after 3 month visit interval. Pt states that he feels like his legs are doing well. He reports that his son assists w/ donning and doffing compression garments morning and night.   Pertinent History HTN, DM, Obesity,COPD PRECAUTIONS, Hx BLE wounds, Hx falls   Limitations difficulty walking, decreased standing tolerance, decreased LE sensation,    Patient Stated Goals decrease leg swelling and improve skin condition and keep it from getting worse   Currently in Pain? No/denies   Pain Onset More than a month ago                      OT Treatments/Exercises (OP) - 02/11/16 0001      ADLs   ADL Education Given Yes     Manual Therapy   Manual Therapy Edema management;Other (comment)   Manual therapy  comments compression garment assessment   Edema Management physical exam to assess BLE swelling and skin condition                OT Education - 02/11/16 1106    Education provided Yes   Education Details Completed Pt education for LE self care. Emphasis today on review of donning and doffing aids, including printed resources, and edu for assessing garment replacement    Person(s) Educated Patient   Methods Explanation;Demonstration   Comprehension Verbalized understanding             OT Long Term Goals - 02/11/16 1110      OT LONG TERM GOAL #1   Title Lymphedema (LE) management/ self-care: Pt able to apply multi layered, gradient compression wraps with min caregiver assistance using proper techniques within 2 weeks to achieve optimal limb volume reduction.   Baseline dependent   Time 2   Period Weeks   Status Achieved     OT LONG TERM GOAL #2   Title Lymphedema (LE) management/ self-care:  Pt to achieve at least 10% RLE limb volume reductions bilaterally during Intensive CDT to limit LE progression, decrease infection and falls risk, to reduce pain/, and to improve safe ambulation and functional mobility.   Baseline dependent   Time 12   Period Weeks   Status Achieved  OT LONG TERM GOAL #4   Title Lymphedema (LE) management/ self-care:  Pt >/= 85 % compliant with all daily, LE self-care protocols for home program w/ needed level of caregiver assistance , including simple self-manual lymphatic drainage (MLD), skin care, lymphatic pumping the ex, skin care, and donning/ doffing compression wraps and garments o limit LE progression and further functional decline.     Baseline dependent   Time 12   Status Achieved     OT LONG TERM GOAL #5   Title Lymphedema (LE) management/ self-care:  Pt to tolerate daily compression wraps, garments and devices in keeping w/ prescribed wear regime within 1 week of issue date to progress and retain clinical and functional gains and  to limit LE progression.   Baseline dependent   Time 12   Period Weeks   Status Achieved     OT LONG TERM GOAL #6   Title Lymphedema (LE) self-care:  During Management Phase CDT Pt to sustain limb volume reductions achieved during Intensive Phase CDT within 5% utilizing LE self-care protocols, appropriate compression garments/ devices, and needed level of caregiver assistance.   Baseline dependent   Period Months   Status On-going               Plan - 02/18/16 1114    Clinical Impression Statement Pt returns today for 3 month followup to assess status of BLE lymphedema self management. Swelling appears very well managed without visible or palpable increases in lymphatic congestion since last visit. Skin on feet is mildly dry and in need of more regulare care to limit risk of infection. Compression garments are showing  normal wear and care, including a run in the L stocking and broken elastic threads scattered throughout on both garments. Pt on his way to DME provider after this session to purchase replacements. On request provided  review and additional edu for assistive devices for donning and doffing garments. Provided printed references for Mediven and [employer] Big Butlers. Pt has met all OT goals for  complete Decongestive Therapy. He agrees with plan to discharge OT today. Pt will call PRN. It has been my pleasure to work with Mr Ennen and his family.    OT Duration --  Return for 1 week followup to check initial progress towards management phase goals and check limb volumetrics bilaterall. If managing w/in goal range Pt to F/U in 1 moth, then DC OT.   OT Treatment/Interventions Self-care/ADL training  skin care   Plan DC OT for LE care. Pt will call PRN   Consulted and Agree with Plan of Care Patient      Patient will benefit from skilled therapeutic intervention in order to improve the following deficits and impairments:     Visit Diagnosis: Lymphedema, not elsewhere  classified      G-Codes - 02-18-2016 1111    Functional Assessment Tool Used Clinical observation, physical examination, medical records review for H&P, Pt and caregiver interview, comparative limb volumetrics   Functional Limitation Self care   Self Care Current Status ([id number]) At least 40 percent but less than 60 percent impaired, limited or restricted   Self Care Discharge Status [phone]) At least 40 percent but less than 60 percent impaired, limited or restricted      Problem List Patient Active Problem List   Diagnosis Date Noted  . DIABETES MELLITUS 10/24/2006  . OBESITY 10/24/2006  . HYPERTENSION 10/24/2006  . ALLERGIC RHINITIS 10/24/2006  . CHRONIC OBSTRUCTIVE PULMONARY DISEASE, MODERATE 10/24/2006  .  TOBACCO ABUSE, HX OF 10/24/2006   Andrey Spearman, MS, OTR/L, [facility] 02/11/16 11:22 AM  [place] MAIN [facility] SERVICES [address] [place], [place], [address] Phone: [phone]   Fax:  [phone]  Name: SHONTE SODERLUND MRN: [id number] Date of Birth: 1933/01/14

## 2016-02-11 NOTE — Patient Instructions (Signed)

## 2016-04-11 DIAGNOSIS — Z794 Long term (current) use of insulin: Secondary | ICD-10-CM | POA: Diagnosis not present

## 2016-04-11 DIAGNOSIS — E1121 Type 2 diabetes mellitus with diabetic nephropathy: Secondary | ICD-10-CM | POA: Diagnosis not present

## 2016-04-15 DIAGNOSIS — E119 Type 2 diabetes mellitus without complications: Secondary | ICD-10-CM | POA: Diagnosis not present

## 2016-04-18 DIAGNOSIS — E1121 Type 2 diabetes mellitus with diabetic nephropathy: Secondary | ICD-10-CM | POA: Diagnosis not present

## 2016-04-18 DIAGNOSIS — E1165 Type 2 diabetes mellitus with hyperglycemia: Secondary | ICD-10-CM | POA: Diagnosis not present

## 2016-04-18 DIAGNOSIS — D631 Anemia in chronic kidney disease: Secondary | ICD-10-CM | POA: Diagnosis not present

## 2016-04-18 DIAGNOSIS — Z794 Long term (current) use of insulin: Secondary | ICD-10-CM | POA: Diagnosis not present

## 2016-04-18 DIAGNOSIS — I10 Essential (primary) hypertension: Secondary | ICD-10-CM | POA: Diagnosis not present

## 2016-04-18 DIAGNOSIS — E7800 Pure hypercholesterolemia, unspecified: Secondary | ICD-10-CM | POA: Diagnosis not present

## 2016-04-18 DIAGNOSIS — N189 Chronic kidney disease, unspecified: Secondary | ICD-10-CM | POA: Diagnosis not present

## 2016-07-11 DIAGNOSIS — Z794 Long term (current) use of insulin: Secondary | ICD-10-CM | POA: Diagnosis not present

## 2016-07-11 DIAGNOSIS — E1121 Type 2 diabetes mellitus with diabetic nephropathy: Secondary | ICD-10-CM | POA: Diagnosis not present

## 2016-07-15 DIAGNOSIS — E119 Type 2 diabetes mellitus without complications: Secondary | ICD-10-CM | POA: Diagnosis not present

## 2016-07-18 DIAGNOSIS — E1121 Type 2 diabetes mellitus with diabetic nephropathy: Secondary | ICD-10-CM | POA: Diagnosis not present

## 2016-07-18 DIAGNOSIS — Z794 Long term (current) use of insulin: Secondary | ICD-10-CM | POA: Diagnosis not present

## 2016-07-18 DIAGNOSIS — J431 Panlobular emphysema: Secondary | ICD-10-CM | POA: Diagnosis not present

## 2016-07-18 DIAGNOSIS — Z0001 Encounter for general adult medical examination with abnormal findings: Secondary | ICD-10-CM | POA: Diagnosis not present

## 2016-07-18 DIAGNOSIS — N183 Chronic kidney disease, stage 3 (moderate): Secondary | ICD-10-CM | POA: Diagnosis not present

## 2016-07-18 DIAGNOSIS — I10 Essential (primary) hypertension: Secondary | ICD-10-CM | POA: Diagnosis not present

## 2016-07-18 DIAGNOSIS — D631 Anemia in chronic kidney disease: Secondary | ICD-10-CM | POA: Diagnosis not present

## 2016-07-18 DIAGNOSIS — E1165 Type 2 diabetes mellitus with hyperglycemia: Secondary | ICD-10-CM | POA: Diagnosis not present

## 2016-07-18 DIAGNOSIS — E7800 Pure hypercholesterolemia, unspecified: Secondary | ICD-10-CM | POA: Diagnosis not present

## 2016-07-18 DIAGNOSIS — N189 Chronic kidney disease, unspecified: Secondary | ICD-10-CM | POA: Diagnosis not present

## 2016-07-19 DIAGNOSIS — E1140 Type 2 diabetes mellitus with diabetic neuropathy, unspecified: Secondary | ICD-10-CM | POA: Diagnosis not present

## 2016-07-19 DIAGNOSIS — Z794 Long term (current) use of insulin: Secondary | ICD-10-CM | POA: Diagnosis not present

## 2016-07-19 DIAGNOSIS — B351 Tinea unguium: Secondary | ICD-10-CM | POA: Diagnosis not present

## 2016-07-31 DIAGNOSIS — Z85828 Personal history of other malignant neoplasm of skin: Secondary | ICD-10-CM | POA: Diagnosis not present

## 2016-07-31 DIAGNOSIS — L821 Other seborrheic keratosis: Secondary | ICD-10-CM | POA: Diagnosis not present

## 2016-07-31 DIAGNOSIS — I8310 Varicose veins of unspecified lower extremity with inflammation: Secondary | ICD-10-CM | POA: Diagnosis not present

## 2016-10-19 DIAGNOSIS — E1165 Type 2 diabetes mellitus with hyperglycemia: Secondary | ICD-10-CM | POA: Diagnosis not present

## 2016-10-19 DIAGNOSIS — N183 Chronic kidney disease, stage 3 (moderate): Secondary | ICD-10-CM | POA: Diagnosis not present

## 2016-10-19 DIAGNOSIS — I10 Essential (primary) hypertension: Secondary | ICD-10-CM | POA: Diagnosis not present

## 2016-12-16 DIAGNOSIS — E119 Type 2 diabetes mellitus without complications: Secondary | ICD-10-CM | POA: Diagnosis not present

## 2017-01-18 DIAGNOSIS — E1121 Type 2 diabetes mellitus with diabetic nephropathy: Secondary | ICD-10-CM | POA: Diagnosis not present

## 2017-01-18 DIAGNOSIS — Z794 Long term (current) use of insulin: Secondary | ICD-10-CM | POA: Diagnosis not present

## 2017-01-18 DIAGNOSIS — N183 Chronic kidney disease, stage 3 (moderate): Secondary | ICD-10-CM | POA: Diagnosis not present

## 2017-01-18 DIAGNOSIS — E7800 Pure hypercholesterolemia, unspecified: Secondary | ICD-10-CM | POA: Diagnosis not present

## 2017-01-18 DIAGNOSIS — I10 Essential (primary) hypertension: Secondary | ICD-10-CM | POA: Diagnosis not present

## 2017-04-17 DIAGNOSIS — Z794 Long term (current) use of insulin: Secondary | ICD-10-CM | POA: Diagnosis not present

## 2017-04-17 DIAGNOSIS — E1121 Type 2 diabetes mellitus with diabetic nephropathy: Secondary | ICD-10-CM | POA: Diagnosis not present

## 2017-04-23 DIAGNOSIS — Z0000 Encounter for general adult medical examination without abnormal findings: Secondary | ICD-10-CM | POA: Diagnosis not present

## 2017-04-23 DIAGNOSIS — I83229 Varicose veins of left lower extremity with both ulcer of unspecified site and inflammation: Secondary | ICD-10-CM | POA: Diagnosis not present

## 2017-04-23 DIAGNOSIS — E7800 Pure hypercholesterolemia, unspecified: Secondary | ICD-10-CM | POA: Diagnosis not present

## 2017-04-23 DIAGNOSIS — N183 Chronic kidney disease, stage 3 (moderate): Secondary | ICD-10-CM | POA: Diagnosis not present

## 2017-04-23 DIAGNOSIS — J449 Chronic obstructive pulmonary disease, unspecified: Secondary | ICD-10-CM | POA: Diagnosis not present

## 2017-04-23 DIAGNOSIS — J431 Panlobular emphysema: Secondary | ICD-10-CM | POA: Diagnosis not present

## 2017-04-23 DIAGNOSIS — I83219 Varicose veins of right lower extremity with both ulcer of unspecified site and inflammation: Secondary | ICD-10-CM | POA: Diagnosis not present

## 2017-04-23 DIAGNOSIS — I10 Essential (primary) hypertension: Secondary | ICD-10-CM | POA: Diagnosis not present

## 2017-04-23 DIAGNOSIS — E1165 Type 2 diabetes mellitus with hyperglycemia: Secondary | ICD-10-CM | POA: Diagnosis not present

## 2017-05-22 DIAGNOSIS — Z794 Long term (current) use of insulin: Secondary | ICD-10-CM | POA: Diagnosis not present

## 2017-05-22 DIAGNOSIS — B351 Tinea unguium: Secondary | ICD-10-CM | POA: Diagnosis not present

## 2017-05-22 DIAGNOSIS — I878 Other specified disorders of veins: Secondary | ICD-10-CM | POA: Diagnosis not present

## 2017-05-22 DIAGNOSIS — E1140 Type 2 diabetes mellitus with diabetic neuropathy, unspecified: Secondary | ICD-10-CM | POA: Diagnosis not present

## 2017-06-13 DIAGNOSIS — L309 Dermatitis, unspecified: Secondary | ICD-10-CM | POA: Diagnosis not present

## 2017-06-13 DIAGNOSIS — Z794 Long term (current) use of insulin: Secondary | ICD-10-CM | POA: Diagnosis not present

## 2017-06-13 DIAGNOSIS — E1140 Type 2 diabetes mellitus with diabetic neuropathy, unspecified: Secondary | ICD-10-CM | POA: Diagnosis not present

## 2017-06-13 DIAGNOSIS — I878 Other specified disorders of veins: Secondary | ICD-10-CM | POA: Diagnosis not present

## 2017-06-27 DIAGNOSIS — L309 Dermatitis, unspecified: Secondary | ICD-10-CM | POA: Diagnosis not present

## 2017-06-27 DIAGNOSIS — I878 Other specified disorders of veins: Secondary | ICD-10-CM | POA: Diagnosis not present

## 2017-07-18 DIAGNOSIS — E1165 Type 2 diabetes mellitus with hyperglycemia: Secondary | ICD-10-CM | POA: Diagnosis not present

## 2017-07-25 DIAGNOSIS — I10 Essential (primary) hypertension: Secondary | ICD-10-CM | POA: Diagnosis not present

## 2017-07-25 DIAGNOSIS — J431 Panlobular emphysema: Secondary | ICD-10-CM | POA: Diagnosis not present

## 2017-07-25 DIAGNOSIS — I878 Other specified disorders of veins: Secondary | ICD-10-CM | POA: Diagnosis not present

## 2017-07-25 DIAGNOSIS — Z794 Long term (current) use of insulin: Secondary | ICD-10-CM | POA: Diagnosis not present

## 2017-07-25 DIAGNOSIS — N400 Enlarged prostate without lower urinary tract symptoms: Secondary | ICD-10-CM | POA: Diagnosis not present

## 2017-07-25 DIAGNOSIS — E1140 Type 2 diabetes mellitus with diabetic neuropathy, unspecified: Secondary | ICD-10-CM | POA: Diagnosis not present

## 2017-07-25 DIAGNOSIS — Z0001 Encounter for general adult medical examination with abnormal findings: Secondary | ICD-10-CM | POA: Diagnosis not present

## 2017-07-25 DIAGNOSIS — L309 Dermatitis, unspecified: Secondary | ICD-10-CM | POA: Diagnosis not present

## 2017-07-25 DIAGNOSIS — B351 Tinea unguium: Secondary | ICD-10-CM | POA: Diagnosis not present

## 2017-07-25 DIAGNOSIS — E1165 Type 2 diabetes mellitus with hyperglycemia: Secondary | ICD-10-CM | POA: Diagnosis not present

## 2017-07-25 DIAGNOSIS — N183 Chronic kidney disease, stage 3 (moderate): Secondary | ICD-10-CM | POA: Diagnosis not present

## 2017-09-11 DIAGNOSIS — H35371 Puckering of macula, right eye: Secondary | ICD-10-CM | POA: Diagnosis not present

## 2017-09-19 DIAGNOSIS — N401 Enlarged prostate with lower urinary tract symptoms: Secondary | ICD-10-CM | POA: Diagnosis not present

## 2017-09-19 DIAGNOSIS — R972 Elevated prostate specific antigen [PSA]: Secondary | ICD-10-CM | POA: Diagnosis not present

## 2017-09-19 DIAGNOSIS — Z125 Encounter for screening for malignant neoplasm of prostate: Secondary | ICD-10-CM | POA: Diagnosis not present

## 2017-09-19 DIAGNOSIS — N3281 Overactive bladder: Secondary | ICD-10-CM | POA: Diagnosis not present

## 2017-09-19 DIAGNOSIS — D400 Neoplasm of uncertain behavior of prostate: Secondary | ICD-10-CM | POA: Diagnosis not present

## 2017-10-24 DIAGNOSIS — E1165 Type 2 diabetes mellitus with hyperglycemia: Secondary | ICD-10-CM | POA: Diagnosis not present

## 2017-10-31 DIAGNOSIS — E1140 Type 2 diabetes mellitus with diabetic neuropathy, unspecified: Secondary | ICD-10-CM | POA: Diagnosis not present

## 2017-10-31 DIAGNOSIS — N401 Enlarged prostate with lower urinary tract symptoms: Secondary | ICD-10-CM | POA: Diagnosis not present

## 2017-10-31 DIAGNOSIS — E1165 Type 2 diabetes mellitus with hyperglycemia: Secondary | ICD-10-CM | POA: Diagnosis not present

## 2017-10-31 DIAGNOSIS — I10 Essential (primary) hypertension: Secondary | ICD-10-CM | POA: Diagnosis not present

## 2017-10-31 DIAGNOSIS — N183 Chronic kidney disease, stage 3 (moderate): Secondary | ICD-10-CM | POA: Diagnosis not present

## 2017-10-31 DIAGNOSIS — J431 Panlobular emphysema: Secondary | ICD-10-CM | POA: Diagnosis not present

## 2017-10-31 DIAGNOSIS — E1121 Type 2 diabetes mellitus with diabetic nephropathy: Secondary | ICD-10-CM | POA: Diagnosis not present

## 2017-10-31 DIAGNOSIS — Z794 Long term (current) use of insulin: Secondary | ICD-10-CM | POA: Diagnosis not present

## 2017-10-31 DIAGNOSIS — B351 Tinea unguium: Secondary | ICD-10-CM | POA: Diagnosis not present

## 2017-12-03 DIAGNOSIS — D400 Neoplasm of uncertain behavior of prostate: Secondary | ICD-10-CM | POA: Diagnosis not present

## 2017-12-03 DIAGNOSIS — N3281 Overactive bladder: Secondary | ICD-10-CM | POA: Diagnosis not present

## 2017-12-03 DIAGNOSIS — R809 Proteinuria, unspecified: Secondary | ICD-10-CM | POA: Diagnosis not present

## 2017-12-03 DIAGNOSIS — R350 Frequency of micturition: Secondary | ICD-10-CM | POA: Diagnosis not present

## 2017-12-03 DIAGNOSIS — R3914 Feeling of incomplete bladder emptying: Secondary | ICD-10-CM | POA: Diagnosis not present

## 2018-01-11 DIAGNOSIS — R3914 Feeling of incomplete bladder emptying: Secondary | ICD-10-CM | POA: Diagnosis not present

## 2018-01-11 DIAGNOSIS — R350 Frequency of micturition: Secondary | ICD-10-CM | POA: Diagnosis not present

## 2018-01-11 DIAGNOSIS — N401 Enlarged prostate with lower urinary tract symptoms: Secondary | ICD-10-CM | POA: Diagnosis not present

## 2018-01-11 DIAGNOSIS — D400 Neoplasm of uncertain behavior of prostate: Secondary | ICD-10-CM | POA: Diagnosis not present

## 2018-01-29 DIAGNOSIS — R3914 Feeling of incomplete bladder emptying: Secondary | ICD-10-CM | POA: Diagnosis not present

## 2018-01-29 DIAGNOSIS — D400 Neoplasm of uncertain behavior of prostate: Secondary | ICD-10-CM | POA: Diagnosis not present

## 2018-01-29 DIAGNOSIS — R350 Frequency of micturition: Secondary | ICD-10-CM | POA: Diagnosis not present

## 2018-01-29 DIAGNOSIS — N401 Enlarged prostate with lower urinary tract symptoms: Secondary | ICD-10-CM | POA: Diagnosis not present

## 2018-02-08 DIAGNOSIS — R3914 Feeling of incomplete bladder emptying: Secondary | ICD-10-CM | POA: Diagnosis not present

## 2018-02-08 DIAGNOSIS — R350 Frequency of micturition: Secondary | ICD-10-CM | POA: Diagnosis not present

## 2018-02-08 DIAGNOSIS — N401 Enlarged prostate with lower urinary tract symptoms: Secondary | ICD-10-CM | POA: Diagnosis not present

## 2018-02-11 DIAGNOSIS — Z794 Long term (current) use of insulin: Secondary | ICD-10-CM | POA: Diagnosis not present

## 2018-02-11 DIAGNOSIS — E1121 Type 2 diabetes mellitus with diabetic nephropathy: Secondary | ICD-10-CM | POA: Diagnosis not present

## 2018-02-12 DIAGNOSIS — N401 Enlarged prostate with lower urinary tract symptoms: Secondary | ICD-10-CM | POA: Diagnosis not present

## 2018-02-15 DIAGNOSIS — N401 Enlarged prostate with lower urinary tract symptoms: Secondary | ICD-10-CM | POA: Diagnosis not present

## 2018-02-18 DIAGNOSIS — Z0000 Encounter for general adult medical examination without abnormal findings: Secondary | ICD-10-CM | POA: Diagnosis not present

## 2018-02-18 DIAGNOSIS — Z794 Long term (current) use of insulin: Secondary | ICD-10-CM | POA: Diagnosis not present

## 2018-02-18 DIAGNOSIS — E1121 Type 2 diabetes mellitus with diabetic nephropathy: Secondary | ICD-10-CM | POA: Diagnosis not present

## 2018-02-18 DIAGNOSIS — N183 Chronic kidney disease, stage 3 (moderate): Secondary | ICD-10-CM | POA: Diagnosis not present

## 2018-02-18 DIAGNOSIS — E7800 Pure hypercholesterolemia, unspecified: Secondary | ICD-10-CM | POA: Diagnosis not present

## 2018-02-18 DIAGNOSIS — J431 Panlobular emphysema: Secondary | ICD-10-CM | POA: Diagnosis not present

## 2018-02-18 DIAGNOSIS — I10 Essential (primary) hypertension: Secondary | ICD-10-CM | POA: Diagnosis not present

## 2018-02-18 DIAGNOSIS — E1165 Type 2 diabetes mellitus with hyperglycemia: Secondary | ICD-10-CM | POA: Diagnosis not present

## 2018-02-27 DIAGNOSIS — B351 Tinea unguium: Secondary | ICD-10-CM | POA: Diagnosis not present

## 2018-02-27 DIAGNOSIS — Z794 Long term (current) use of insulin: Secondary | ICD-10-CM | POA: Diagnosis not present

## 2018-02-27 DIAGNOSIS — Z1211 Encounter for screening for malignant neoplasm of colon: Secondary | ICD-10-CM | POA: Diagnosis not present

## 2018-02-27 DIAGNOSIS — I878 Other specified disorders of veins: Secondary | ICD-10-CM | POA: Diagnosis not present

## 2018-02-27 DIAGNOSIS — E1142 Type 2 diabetes mellitus with diabetic polyneuropathy: Secondary | ICD-10-CM | POA: Diagnosis not present

## 2018-03-06 DIAGNOSIS — L97921 Non-pressure chronic ulcer of unspecified part of left lower leg limited to breakdown of skin: Secondary | ICD-10-CM | POA: Diagnosis not present

## 2018-03-12 DIAGNOSIS — E1142 Type 2 diabetes mellitus with diabetic polyneuropathy: Secondary | ICD-10-CM | POA: Diagnosis not present

## 2018-03-12 DIAGNOSIS — L97921 Non-pressure chronic ulcer of unspecified part of left lower leg limited to breakdown of skin: Secondary | ICD-10-CM | POA: Diagnosis not present

## 2018-03-12 DIAGNOSIS — I878 Other specified disorders of veins: Secondary | ICD-10-CM | POA: Diagnosis not present

## 2018-04-10 DIAGNOSIS — L309 Dermatitis, unspecified: Secondary | ICD-10-CM | POA: Diagnosis not present

## 2018-04-10 DIAGNOSIS — E1142 Type 2 diabetes mellitus with diabetic polyneuropathy: Secondary | ICD-10-CM | POA: Diagnosis not present

## 2018-04-10 DIAGNOSIS — B351 Tinea unguium: Secondary | ICD-10-CM | POA: Diagnosis not present

## 2018-04-10 DIAGNOSIS — I878 Other specified disorders of veins: Secondary | ICD-10-CM | POA: Diagnosis not present

## 2018-05-22 ENCOUNTER — Inpatient Hospital Stay
Admission: EM | Admit: 2018-05-22 | Discharge: 2018-05-28 | DRG: 871 | Disposition: A | Discharge status: Home / Self Care | Payer: Medicare HMO | Attending: Internal Medicine | Admitting: Internal Medicine

## 2018-05-22 ENCOUNTER — Other Ambulatory Visit: Payer: Self-pay

## 2018-05-22 ENCOUNTER — Emergency Department: Payer: Medicare HMO

## 2018-05-22 DIAGNOSIS — R509 Fever, unspecified: Secondary | ICD-10-CM | POA: Diagnosis not present

## 2018-05-22 DIAGNOSIS — A409 Streptococcal sepsis, unspecified: Secondary | ICD-10-CM | POA: Diagnosis not present

## 2018-05-22 DIAGNOSIS — N179 Acute kidney failure, unspecified: Secondary | ICD-10-CM | POA: Diagnosis present

## 2018-05-22 DIAGNOSIS — Z833 Family history of diabetes mellitus: Secondary | ICD-10-CM

## 2018-05-22 DIAGNOSIS — R600 Localized edema: Secondary | ICD-10-CM | POA: Diagnosis present

## 2018-05-22 DIAGNOSIS — N390 Urinary tract infection, site not specified: Secondary | ICD-10-CM | POA: Diagnosis present

## 2018-05-22 DIAGNOSIS — N183 Chronic kidney disease, stage 3 (moderate): Secondary | ICD-10-CM | POA: Diagnosis not present

## 2018-05-22 DIAGNOSIS — Z6835 Body mass index (BMI) 35.0-35.9, adult: Secondary | ICD-10-CM

## 2018-05-22 DIAGNOSIS — E1122 Type 2 diabetes mellitus with diabetic chronic kidney disease: Secondary | ICD-10-CM | POA: Diagnosis not present

## 2018-05-22 DIAGNOSIS — J9601 Acute respiratory failure with hypoxia: Secondary | ICD-10-CM | POA: Diagnosis present

## 2018-05-22 DIAGNOSIS — B353 Tinea pedis: Secondary | ICD-10-CM | POA: Diagnosis not present

## 2018-05-22 DIAGNOSIS — I959 Hypotension, unspecified: Secondary | ICD-10-CM | POA: Diagnosis not present

## 2018-05-22 DIAGNOSIS — Z66 Do not resuscitate: Secondary | ICD-10-CM | POA: Diagnosis not present

## 2018-05-22 DIAGNOSIS — I129 Hypertensive chronic kidney disease with stage 1 through stage 4 chronic kidney disease, or unspecified chronic kidney disease: Secondary | ICD-10-CM | POA: Diagnosis present

## 2018-05-22 DIAGNOSIS — J189 Pneumonia, unspecified organism: Secondary | ICD-10-CM

## 2018-05-22 DIAGNOSIS — J9811 Atelectasis: Secondary | ICD-10-CM | POA: Diagnosis not present

## 2018-05-22 DIAGNOSIS — Z89021 Acquired absence of right finger(s): Secondary | ICD-10-CM | POA: Diagnosis not present

## 2018-05-22 DIAGNOSIS — N400 Enlarged prostate without lower urinary tract symptoms: Secondary | ICD-10-CM | POA: Diagnosis present

## 2018-05-22 DIAGNOSIS — Z885 Allergy status to narcotic agent status: Secondary | ICD-10-CM | POA: Diagnosis not present

## 2018-05-22 DIAGNOSIS — Z20828 Contact with and (suspected) exposure to other viral communicable diseases: Secondary | ICD-10-CM | POA: Diagnosis present

## 2018-05-22 DIAGNOSIS — J441 Chronic obstructive pulmonary disease with (acute) exacerbation: Secondary | ICD-10-CM | POA: Diagnosis not present

## 2018-05-22 DIAGNOSIS — L97919 Non-pressure chronic ulcer of unspecified part of right lower leg with unspecified severity: Secondary | ICD-10-CM | POA: Diagnosis present

## 2018-05-22 DIAGNOSIS — Z794 Long term (current) use of insulin: Secondary | ICD-10-CM

## 2018-05-22 DIAGNOSIS — L03115 Cellulitis of right lower limb: Secondary | ICD-10-CM | POA: Diagnosis not present

## 2018-05-22 DIAGNOSIS — A419 Sepsis, unspecified organism: Secondary | ICD-10-CM | POA: Diagnosis not present

## 2018-05-22 DIAGNOSIS — N289 Disorder of kidney and ureter, unspecified: Secondary | ICD-10-CM | POA: Diagnosis present

## 2018-05-22 DIAGNOSIS — B955 Unspecified streptococcus as the cause of diseases classified elsewhere: Secondary | ICD-10-CM | POA: Diagnosis not present

## 2018-05-22 DIAGNOSIS — D631 Anemia in chronic kidney disease: Secondary | ICD-10-CM | POA: Diagnosis present

## 2018-05-22 DIAGNOSIS — F17290 Nicotine dependence, other tobacco product, uncomplicated: Secondary | ICD-10-CM | POA: Diagnosis present

## 2018-05-22 DIAGNOSIS — L0390 Cellulitis, unspecified: Secondary | ICD-10-CM | POA: Diagnosis not present

## 2018-05-22 DIAGNOSIS — B369 Superficial mycosis, unspecified: Secondary | ICD-10-CM | POA: Diagnosis not present

## 2018-05-22 DIAGNOSIS — J449 Chronic obstructive pulmonary disease, unspecified: Secondary | ICD-10-CM

## 2018-05-22 DIAGNOSIS — E785 Hyperlipidemia, unspecified: Secondary | ICD-10-CM | POA: Diagnosis present

## 2018-05-22 DIAGNOSIS — D62 Acute posthemorrhagic anemia: Secondary | ICD-10-CM | POA: Diagnosis not present

## 2018-05-22 DIAGNOSIS — N189 Chronic kidney disease, unspecified: Secondary | ICD-10-CM | POA: Diagnosis not present

## 2018-05-22 DIAGNOSIS — B954 Other streptococcus as the cause of diseases classified elsewhere: Secondary | ICD-10-CM | POA: Diagnosis not present

## 2018-05-22 DIAGNOSIS — E1165 Type 2 diabetes mellitus with hyperglycemia: Secondary | ICD-10-CM | POA: Diagnosis not present

## 2018-05-22 DIAGNOSIS — R918 Other nonspecific abnormal finding of lung field: Secondary | ICD-10-CM | POA: Diagnosis not present

## 2018-05-22 DIAGNOSIS — I499 Cardiac arrhythmia, unspecified: Secondary | ICD-10-CM | POA: Diagnosis not present

## 2018-05-22 DIAGNOSIS — R0689 Other abnormalities of breathing: Secondary | ICD-10-CM | POA: Diagnosis not present

## 2018-05-22 DIAGNOSIS — R05 Cough: Secondary | ICD-10-CM | POA: Diagnosis not present

## 2018-05-22 DIAGNOSIS — D72829 Elevated white blood cell count, unspecified: Secondary | ICD-10-CM | POA: Diagnosis not present

## 2018-05-22 DIAGNOSIS — R350 Frequency of micturition: Secondary | ICD-10-CM | POA: Diagnosis not present

## 2018-05-22 DIAGNOSIS — R7881 Bacteremia: Secondary | ICD-10-CM | POA: Diagnosis not present

## 2018-05-22 DIAGNOSIS — K921 Melena: Secondary | ICD-10-CM | POA: Diagnosis not present

## 2018-05-22 LAB — COMPREHENSIVE METABOLIC PANEL
ALT: 12 U/L (ref 0–44)
AST: 18 U/L (ref 15–41)
Albumin: 4 g/dL (ref 3.5–5.0)
Alkaline Phosphatase: 53 U/L (ref 38–126)
Anion gap: 9 (ref 5–15)
BUN: 46 mg/dL — ABNORMAL HIGH (ref 8–23)
CO2: 25 mmol/L (ref 22–32)
Calcium: 8.1 mg/dL — ABNORMAL LOW (ref 8.9–10.3)
Chloride: 103 mmol/L (ref 98–111)
Creatinine, Ser: 2.26 mg/dL — ABNORMAL HIGH (ref 0.61–1.24)
GFR calc Af Amer: 30 mL/min — ABNORMAL LOW (ref >60)
GFR calc non Af Amer: 26 mL/min — ABNORMAL LOW (ref >60)
Glucose, Bld: 244 mg/dL — ABNORMAL HIGH (ref 70–99)
Potassium: 4.5 mmol/L (ref 3.5–5.1)
Sodium: 137 mmol/L (ref 135–145)
Total Bilirubin: 1 mg/dL (ref 0.3–1.2)
Total Protein: 7.2 g/dL (ref 6.5–8.1)

## 2018-05-22 LAB — CBC WITH DIFFERENTIAL/PLATELET
Abs Immature Granulocytes: 0.41 10*3/uL — ABNORMAL HIGH (ref 0.00–0.07)
Basophils Absolute: 0.1 10*3/uL (ref 0.0–0.1)
Basophils Relative: 0 %
Eosinophils Absolute: 0.1 10*3/uL (ref 0.0–0.5)
Eosinophils Relative: 0 %
HCT: 29.9 % — ABNORMAL LOW (ref 39.0–52.0)
Hemoglobin: 9.3 g/dL — ABNORMAL LOW (ref 13.0–17.0)
Immature Granulocytes: 1 %
Lymphocytes Relative: 3 %
Lymphs Abs: 1 10*3/uL (ref 0.7–4.0)
MCH: 30.9 pg (ref 26.0–34.0)
MCHC: 31.1 g/dL (ref 30.0–36.0)
MCV: 99.3 fL (ref 80.0–100.0)
Monocytes Absolute: 1.5 10*3/uL — ABNORMAL HIGH (ref 0.1–1.0)
Monocytes Relative: 5 %
Neutro Abs: 28.6 10*3/uL — ABNORMAL HIGH (ref 1.7–7.7)
Neutrophils Relative %: 91 %
Platelets: 241 10*3/uL (ref 150–400)
RBC: 3.01 MIL/uL — ABNORMAL LOW (ref 4.22–5.81)
RDW: 18.4 % — ABNORMAL HIGH (ref 11.5–15.5)
Smear Review: NORMAL
WBC: 31.7 10*3/uL — ABNORMAL HIGH (ref 4.0–10.5)
nRBC: 0 % (ref 0.0–0.2)

## 2018-05-22 LAB — URINALYSIS, ROUTINE W REFLEX MICROSCOPIC
Bacteria, UA: NONE SEEN
Bilirubin Urine: NEGATIVE
Glucose, UA: 50 mg/dL — AB
Ketones, ur: NEGATIVE mg/dL
Leukocytes,Ua: NEGATIVE
Nitrite: NEGATIVE
Protein, ur: 100 mg/dL — AB
Specific Gravity, Urine: 1.016 (ref 1.005–1.030)
Squamous Epithelial / HPF: NONE SEEN (ref 0–5)
pH: 5 (ref 5.0–8.0)

## 2018-05-22 LAB — LACTIC ACID, PLASMA: Lactic Acid, Venous: 1.9 mmol/L (ref 0.5–1.9)

## 2018-05-22 LAB — SARS CORONAVIRUS 2 BY RT PCR (HOSPITAL ORDER, PERFORMED IN ~~LOC~~ HOSPITAL LAB): SARS Coronavirus 2: NEGATIVE

## 2018-05-22 MED ORDER — SODIUM CHLORIDE 0.9 % IV SOLN
2.0000 g | Freq: Once | INTRAVENOUS | Status: AC
  Administered 2018-05-22: 2 g via INTRAVENOUS
  Filled 2018-05-22 (×2): qty 2

## 2018-05-22 MED ORDER — VANCOMYCIN HCL IN DEXTROSE 1-5 GM/200ML-% IV SOLN: 1000.0000 mg | Freq: Once | INTRAVENOUS | Status: DC

## 2018-05-22 MED ORDER — METRONIDAZOLE IN NACL 5-0.79 MG/ML-% IV SOLN
500.0000 mg | Freq: Once | INTRAVENOUS | Status: AC
  Administered 2018-05-22: 22:00:00 500 mg via INTRAVENOUS
  Filled 2018-05-22 (×2): qty 100

## 2018-05-22 MED ORDER — VANCOMYCIN HCL 10 G IV SOLR
2500.0000 mg | Freq: Once | INTRAVENOUS | Status: AC
  Administered 2018-05-22: 2500 mg via INTRAVENOUS
  Filled 2018-05-22: qty 2500

## 2018-05-22 NOTE — ED Provider Notes (Signed)
[facility] Emergency Department Provider Note  Time seen: 8:42 PM  I have reviewed the triage vital signs and the nursing notes.   HISTORY  Chief Complaint Fever   HPI Arthur [name] is a 85 y.o. male with a past medical history of COPD, diabetes, hypertension, hyperlipidemia presents to the emergency department for fever, shortness of breath and leg swelling.  According to the patient over the past several months his legs have been very swollen, he states over the past several weeks the swelling has increased.  States some tenderness in his right leg as well as redness in the right leg.  Patient noted to have a fever tonight.  States shortness of breath which she states has been ongoing for weeks but getting worse.  Denies any chest pain.  Occasional cough.  Patient denies any home oxygen requirement currently satting 92% during my evaluation on room air.   Past Medical History:  Diagnosis Date  . COPD (chronic obstructive pulmonary disease) ([place])   . Diabetes mellitus without complication ([place])   . Hyperlipidemia   . Hypertension     Patient Active Problem List   Diagnosis Date Noted  . DIABETES MELLITUS 10/24/2006  . OBESITY 10/24/2006  . HYPERTENSION 10/24/2006  . ALLERGIC RHINITIS 10/24/2006  . CHRONIC OBSTRUCTIVE PULMONARY DISEASE, MODERATE 10/24/2006  . TOBACCO ABUSE, HX OF 10/24/2006    History reviewed. No pertinent surgical history.  Prior to Admission medications   Not on File    Not on File  History reviewed. No pertinent family history.  Social History Social History   Tobacco Use  . Smoking status: Former Smoker    Last attempt to quit: 11/04/1982    Years since quitting: 35.5  . Smokeless tobacco: Current User  Substance Use Topics  . Alcohol use: Yes    Comment: 1/5 /wk  . Drug use: No    Review of Systems Constitutional: Positive for fever today. Eyes: Negative for visual complaints ENT: Negative for recent  illness/congestion Cardiovascular: Negative for chest pain. Respiratory: Positive for shortness of breath, worsening over the past several weeks. Gastrointestinal: Negative for abdominal pain, vomiting Musculoskeletal: Bilateral lower extremity swelling with redness of the right lower extremity Skin: Negative for skin complaints  Neurological: Negative for headache All other ROS negative  ____________________________________________   PHYSICAL EXAM:  VITAL SIGNS: ED Triage Vitals  Enc Vitals Group     BP 05/22/18 2009 (!) 160/49     Pulse Rate 05/22/18 1943 94     Resp 05/22/18 1943 (!) 22     Temp 05/22/18 1943 100.3 F (37.9 C)     Temp src --      SpO2 05/22/18 1943 100 %     Weight 05/22/18 1943 260 lb (117.9 kg)     Height 05/22/18 1943 5\' 11"  (1.803 m)     Head Circumference --      Peak Flow --      Pain Score 05/22/18 1943 0     Pain Loc --      Pain Edu? --      Excl. in [place]? --    Constitutional: Alert and oriented. Well appearing and in no distress. Eyes: Normal exam ENT      Head: Normocephalic and atraumatic.      Mouth/Throat: Mucous membranes are moist. Cardiovascular: Normal rate, regular rhythm Respiratory: Normal respiratory effort without tachypnea nor retractions.  Fairly diminished breath sounds bilaterally but no obvious wheeze rales or rhonchi. Gastrointestinal: Soft  and nontender. No distention.  Obese. Musculoskeletal: Patient has 3+ lower extremity edema equal bilaterally, erythema and warmth of the right lower extremity with mild tenderness to palpation. Neurologic:  Normal speech and language. No gross focal neurologic deficits Skin:  Skin is warm.  Erythematous over the right lower extremity with tenderness. Psychiatric: Mood and affect are normal. Speech and behavior are normal.   ____________________________________________   RADIOLOGY  Chest x-ray shows interstitial opacities consistent with interstitial inflammatory  process.  ____________________________________________   INITIAL IMPRESSION / ASSESSMENT AND PLAN / ED COURSE  Pertinent labs & imaging results that were available during my care of the patient were reviewed by me and considered in my medical decision making (see chart for details).   Patient presents emergency department with a fever, noted to have shortness of breath worsening over the past several weeks, worsening lower extremity edema now with redness and warmth of his right lower extremity.  Differential would include cellulitis, DVT, CHF exacerbation, pulmonary edema, ACS.  We will check labs, chest x-ray, coronavirus.  We will cover with broad-spectrum antibiotics given the patient's fever and shortness of breath.  Currently satting 92 to 93% on room air during my evaluation.  Patient's labs show significant leukocytosis, given tachypnea with fever patient meets sepsis criteria.  Patient receiving broad-spectrum IV antibiotics.  X-ray shows interstitial opacities which could possibly be representative of pneumonia.  Patient has right lower extremity erythema most consistent with cellulitis.  Ultrasounds are pending.  Hospitalist is aware of the patient.  RAMAL [name] was evaluated in Emergency Department on 05/22/2018 for the symptoms described in the history of present illness. He was evaluated in the context of the global COVID-19 pandemic, which necessitated consideration that the patient might be at risk for infection with the SARS-CoV-2 virus that causes COVID-19. Institutional protocols and algorithms that pertain to the evaluation of patients at risk for COVID-19 are in a state of rapid change based on information released by regulatory bodies including the CDC and federal and state organizations. These policies and algorithms were followed during the patient's care in the ED.  CRITICAL CARE Performed by: Harvest Dark   Total critical care time: 30 minutes  Critical care  time was exclusive of separately billable procedures and treating other patients.  Critical care was necessary to treat or prevent imminent or life-threatening deterioration.  Critical care was time spent personally by me on the following activities: development of treatment plan with patient and/or surrogate as well as nursing, discussions with consultants, evaluation of patient's response to treatment, examination of patient, obtaining history from patient or surrogate, ordering and performing treatments and interventions, ordering and review of laboratory studies, ordering and review of radiographic studies, pulse oximetry and re-evaluation of patient's condition.   ____________________________________________   FINAL CLINICAL IMPRESSION(S) / ED DIAGNOSES  Fever cellulitis   Harvest Dark, MD 05/22/18 2246

## 2018-05-22 NOTE — ED Notes (Addendum)
ED TO INPATIENT HANDOFF REPORT  ED Nurse Name and Phone #: Karena Addison [phone] Name/Age/Gender Arthur [name] 85 y.o. male Room/Bed: ED33A/ED33A  Code Status   Code Status: Not on file  Home/SNF/Other Home Patient oriented to: self, place, time and situation Is this baseline? Yes   Triage Complete: Triage complete  Chief Complaint pneumonia ems  Triage Note Pt arrived via [facility] from home with a fever of 100.1 oral at home and a productive cough for quite a while. Pt was found on the toilet and had been incontinent. Hx of DM, COPD, and HTN. Pt has significant edema to both legs and is on a diuretic.    Allergies Not on File  Level of Care/Admitting Diagnosis ED Disposition    ED Disposition Condition Comment   Admit  The patient appears reasonably stabilized for admission considering the current resources, flow, and capabilities available in the ED at this time, and I doubt any other [facility] requiring further screening and/or treatment in the ED prior to admission is  present.       B Medical/Surgery History Past Medical History:  Diagnosis Date  . COPD (chronic obstructive pulmonary disease) ([place])   . Diabetes mellitus without complication ([place])   . Hyperlipidemia   . Hypertension    History reviewed. No pertinent surgical history.   A IV Location/Drains/Wounds Patient Lines/Drains/Airways Status   Active Line/Drains/Airways    Name:   Placement date:   Placement time:   Site:   Days:   Peripheral IV 05/22/18 Left Antecubital   05/22/18    1946    Antecubital   less than 1          Intake/Output Last 24 hours No intake or output data in the 24 hours ending 05/22/18 2247  Labs/Imaging Results for orders placed or performed during the hospital encounter of 05/22/18 (from the past 48 hour(s))  Comprehensive metabolic panel     Status: Abnormal   Collection Time: 05/22/18  7:45 PM  Result Value Ref Range   Sodium 137 135 - 145 mmol/L   Potassium 4.5 3.5 - 5.1 mmol/L    Chloride 103 98 - 111 mmol/L   CO2 25 22 - 32 mmol/L   Glucose, Bld 244 (H) 70 - 99 mg/dL   BUN 46 (H) 8 - 23 mg/dL   Creatinine, Ser 2.26 (H) 0.61 - 1.24 mg/dL   Calcium 8.1 (L) 8.9 - 10.3 mg/dL   Total Protein 7.2 6.5 - 8.1 g/dL   Albumin 4.0 3.5 - 5.0 g/dL   AST 18 15 - 41 U/L   ALT 12 0 - 44 U/L   Alkaline Phosphatase 53 38 - 126 U/L   Total Bilirubin 1.0 0.3 - 1.2 mg/dL   GFR calc non Af Amer 26 (L) >60 mL/min   GFR calc Af Amer 30 (L) >60 mL/min   Anion gap 9 5 - 15    Comment: Performed at [facility], [address] [place], [place] [address]  CBC with Differential     Status: Abnormal   Collection Time: 05/22/18  7:45 PM  Result Value Ref Range   WBC 31.7 (H) 4.0 - 10.5 K/uL   RBC 3.01 (L) 4.22 - 5.81 MIL/uL   Hemoglobin 9.3 (L) 13.0 - 17.0 g/dL   HCT 29.9 (L) 39.0 - 52.0 %   MCV 99.3 80.0 - 100.0 fL   MCH 30.9 26.0 - 34.0 pg   MCHC 31.1 30.0 - 36.0 g/dL   RDW  18.4 (H) 11.5 - 15.5 %   Platelets 241 150 - 400 K/uL   nRBC 0.0 0.0 - 0.2 %   Neutrophils Relative % 91 %   Neutro Abs 28.6 (H) 1.7 - 7.7 K/uL   Lymphocytes Relative 3 %   Lymphs Abs 1.0 0.7 - 4.0 K/uL   Monocytes Relative 5 %   Monocytes Absolute 1.5 (H) 0.1 - 1.0 K/uL   Eosinophils Relative 0 %   Eosinophils Absolute 0.1 0.0 - 0.5 K/uL   Basophils Relative 0 %   Basophils Absolute 0.1 0.0 - 0.1 K/uL   WBC Morphology MORPHOLOGY UNREMARKABLE    Smear Review Normal platelet morphology    Immature Granulocytes 1 %   Abs Immature Granulocytes 0.41 (H) 0.00 - 0.07 K/uL   Dimorphism PRESENT    Ovalocytes PRESENT     Comment: Performed at [facility], [address] [place], [place] [address]  Urinalysis, Routine w reflex microscopic     Status: Abnormal   Collection Time: 05/22/18  7:46 PM  Result Value Ref Range   Color, Urine YELLOW (A) YELLOW   APPearance HAZY (A) CLEAR   Specific Gravity, Urine 1.016 1.005 - 1.030   pH 5.0 5.0 - 8.0   Glucose, UA 50 (A) NEGATIVE mg/dL    Hgb urine dipstick MODERATE (A) NEGATIVE   Bilirubin Urine NEGATIVE NEGATIVE   Ketones, ur NEGATIVE NEGATIVE mg/dL   Protein, ur 100 (A) NEGATIVE mg/dL   Nitrite NEGATIVE NEGATIVE   Leukocytes,Ua NEGATIVE NEGATIVE   RBC / HPF 6-10 0 - 5 RBC/hpf   WBC, UA 0-5 0 - 5 WBC/hpf   Bacteria, UA NONE SEEN NONE SEEN   Squamous Epithelial / LPF NONE SEEN 0 - 5   Mucus PRESENT    Amorphous Crystal PRESENT     Comment: Performed at [facility], [address] [place], [place] [address]  Lactic acid, plasma     Status: None   Collection Time: 05/22/18  8:00 PM  Result Value Ref Range   Lactic Acid, Venous 1.9 0.5 - 1.9 mmol/L    Comment: Performed at [facility], [address] [place], [place] [address]   Dg Chest Port 1 View  Result Date: 05/22/2018 CLINICAL DATA:  Sepsis fever productive cough EXAM: PORTABLE CHEST 1 VIEW COMPARISON:  None. FINDINGS: Heart size upper limits of normal. No consolidation or effusion. Diffuse interstitial opacity. No pneumothorax. Minimal atelectasis right base. IMPRESSION: Mild diffuse increased interstitial opacity suggesting interstitial inflammatory process. Mild atelectasis at the right base Electronically Signed   By: Donavan Foil M.D.   On: 05/22/2018 20:05    Pending Labs Unresulted Labs (From admission, onward)    Start     Ordered   05/22/18 2046  Blood Culture (routine x 2)  BLOOD CULTURE X 2,   STAT     05/22/18 2045   05/22/18 2046  Urine culture  ONCE - STAT,   STAT     05/22/18 2045   05/22/18 2027  SARS Coronavirus 2 (CEPHEID- Performed in [place] hospital lab), Hosp Order  (Symptomatic Patients Labs with Precautions )  Once,   STAT     05/22/18 2026          Vitals/Pain Today's Vitals   05/22/18 1943 05/22/18 2009 05/22/18 2147 05/22/18 2230  BP:  (!) 160/49 (!) 159/60 (!) 165/56  Pulse: 94 83 82 86  Resp: (!) 22 20 (!) 35 (!) 24  Temp: 100.3 F (37.9 C)  SpO2: 100% 99% 93% 95%  Weight: 117.9 kg      Height: 5\' 11"  (1.803 m)     PainSc: 0-No pain       Isolation Precautions Droplet and Contact precautions  Medications Medications  metroNIDAZOLE (FLAGYL) IVPB 500 mg (500 mg Intravenous New Bag/Given 05/22/18 2223)  vancomycin (VANCOCIN) 2,500 mg in sodium chloride 0.9 % 500 mL IVPB (has no administration in time range)  ceFEPIme (MAXIPIME) 2 g in sodium chloride 0.9 % 100 mL IVPB (0 g Intravenous Stopped 05/22/18 2222)    Mobility walks Moderate fall risk   Focused Assessments    R Recommendations: See Admitting Provider Note  Report given to: Elisha Headland, RN

## 2018-05-22 NOTE — Progress Notes (Signed)
CODE SEPSIS - PHARMACY COMMUNICATION  **Broad Spectrum Antibiotics should be administered within 1 hour of Sepsis diagnosis**  Time Code Sepsis Called/Page Received:  5/20 @ 2046  Antibiotics Ordered:  Cefepime , Vancomycin , Metronidazole   Time of 1st antibiotic administration:  5/20 @ 2145   Additional action taken by pharmacy:   If necessary, Name of Provider/Nurse Contacted:     Daiel Strohecker D ,PharmD Clinical Pharmacist  05/22/2018  10:11 PM

## 2018-05-22 NOTE — ED Triage Notes (Addendum)
Pt arrived via [facility] from home with a fever of 100.1 oral at home and a productive cough for quite a while. Pt was found on the toilet and had been incontinent. Hx of DM, COPD, and HTN. Pt has significant edema to both legs and is on a diuretic.

## 2018-05-23 DIAGNOSIS — A419 Sepsis, unspecified organism: Secondary | ICD-10-CM | POA: Diagnosis present

## 2018-05-23 DIAGNOSIS — Z89021 Acquired absence of right finger(s): Secondary | ICD-10-CM

## 2018-05-23 DIAGNOSIS — L03115 Cellulitis of right lower limb: Secondary | ICD-10-CM

## 2018-05-23 DIAGNOSIS — E1122 Type 2 diabetes mellitus with diabetic chronic kidney disease: Secondary | ICD-10-CM

## 2018-05-23 DIAGNOSIS — R7881 Bacteremia: Secondary | ICD-10-CM

## 2018-05-23 DIAGNOSIS — R350 Frequency of micturition: Secondary | ICD-10-CM

## 2018-05-23 DIAGNOSIS — R05 Cough: Secondary | ICD-10-CM

## 2018-05-23 DIAGNOSIS — B955 Unspecified streptococcus as the cause of diseases classified elsewhere: Secondary | ICD-10-CM

## 2018-05-23 DIAGNOSIS — Z87891 Personal history of nicotine dependence: Secondary | ICD-10-CM

## 2018-05-23 DIAGNOSIS — B369 Superficial mycosis, unspecified: Secondary | ICD-10-CM

## 2018-05-23 DIAGNOSIS — J449 Chronic obstructive pulmonary disease, unspecified: Secondary | ICD-10-CM

## 2018-05-23 DIAGNOSIS — E785 Hyperlipidemia, unspecified: Secondary | ICD-10-CM

## 2018-05-23 DIAGNOSIS — N189 Chronic kidney disease, unspecified: Secondary | ICD-10-CM

## 2018-05-23 DIAGNOSIS — I129 Hypertensive chronic kidney disease with stage 1 through stage 4 chronic kidney disease, or unspecified chronic kidney disease: Secondary | ICD-10-CM

## 2018-05-23 LAB — GLUCOSE, CAPILLARY
Glucose-Capillary: 178 mg/dL — ABNORMAL HIGH (ref 70–99)
Glucose-Capillary: 210 mg/dL — ABNORMAL HIGH (ref 70–99)
Glucose-Capillary: 248 mg/dL — ABNORMAL HIGH (ref 70–99)
Glucose-Capillary: 253 mg/dL — ABNORMAL HIGH (ref 70–99)

## 2018-05-23 LAB — CBC
HCT: 25.5 % — ABNORMAL LOW (ref 39.0–52.0)
Hemoglobin: 8 g/dL — ABNORMAL LOW (ref 13.0–17.0)
MCH: 30.8 pg (ref 26.0–34.0)
MCHC: 31.4 g/dL (ref 30.0–36.0)
MCV: 98.1 fL (ref 80.0–100.0)
Platelets: 205 10*3/uL (ref 150–400)
RBC: 2.6 MIL/uL — ABNORMAL LOW (ref 4.22–5.81)
RDW: 18.4 % — ABNORMAL HIGH (ref 11.5–15.5)
WBC: 23.9 10*3/uL — ABNORMAL HIGH (ref 4.0–10.5)
nRBC: 0 % (ref 0.0–0.2)

## 2018-05-23 LAB — BLOOD CULTURE ID PANEL (REFLEXED)

## 2018-05-23 LAB — BASIC METABOLIC PANEL
Anion gap: 7 (ref 5–15)
BUN: 48 mg/dL — ABNORMAL HIGH (ref 8–23)
CO2: 25 mmol/L (ref 22–32)
Calcium: 7.5 mg/dL — ABNORMAL LOW (ref 8.9–10.3)
Chloride: 105 mmol/L (ref 98–111)
Creatinine, Ser: 2.3 mg/dL — ABNORMAL HIGH (ref 0.61–1.24)
GFR calc Af Amer: 30 mL/min — ABNORMAL LOW (ref >60)
GFR calc non Af Amer: 25 mL/min — ABNORMAL LOW (ref >60)
Glucose, Bld: 224 mg/dL — ABNORMAL HIGH (ref 70–99)
Potassium: 4.2 mmol/L (ref 3.5–5.1)
Sodium: 137 mmol/L (ref 135–145)

## 2018-05-23 LAB — PROCALCITONIN: Procalcitonin: 0.98 ng/mL

## 2018-05-23 LAB — HEMOGLOBIN A1C
Hgb A1c MFr Bld: 7.1 % — ABNORMAL HIGH (ref 4.8–5.6)
Mean Plasma Glucose: 157.07 mg/dL

## 2018-05-23 LAB — EXPECTORATED SPUTUM ASSESSMENT W GRAM STAIN, RFLX TO RESP C: Special Requests: NORMAL

## 2018-05-23 LAB — PROTIME-INR
INR: 1.3 — ABNORMAL HIGH (ref 0.8–1.2)
Prothrombin Time: 16 seconds — ABNORMAL HIGH (ref 11.4–15.2)

## 2018-05-23 LAB — CORTISOL-AM, BLOOD: Cortisol - AM: 21.1 ug/dL (ref 6.7–22.6)

## 2018-05-23 MED ORDER — IPRATROPIUM-ALBUTEROL 0.5-2.5 (3) MG/3ML IN SOLN
3.0000 mL | Freq: Four times a day (QID) | RESPIRATORY_TRACT | Status: DC
  Administered 2018-05-23 (×3): 3 mL via RESPIRATORY_TRACT
  Filled 2018-05-23 (×3): qty 3

## 2018-05-23 MED ORDER — FOLIC ACID 1 MG PO TABS
1.0000 mg | ORAL_TABLET | Freq: Every day | ORAL | Status: DC
  Administered 2018-05-23 – 2018-05-28 (×6): 1 mg via ORAL
  Filled 2018-05-23 (×6): qty 1

## 2018-05-23 MED ORDER — ENOXAPARIN SODIUM 30 MG/0.3ML ~~LOC~~ SOLN
30.0000 mg | SUBCUTANEOUS | Status: DC
  Administered 2018-05-23 – 2018-05-26 (×4): 30 mg via SUBCUTANEOUS
  Filled 2018-05-23 (×3): qty 0.3

## 2018-05-23 MED ORDER — SODIUM CHLORIDE 0.9 % IV SOLN
INTRAVENOUS | Status: DC | PRN
  Administered 2018-05-23: 09:00:00 15 mL via INTRAVENOUS
  Administered 2018-05-24: 14:00:00 20 mL via INTRAVENOUS
  Administered 2018-05-26 (×2): 1000 mL via INTRAVENOUS

## 2018-05-23 MED ORDER — FLUCONAZOLE 100 MG PO TABS
100.0000 mg | ORAL_TABLET | Freq: Every day | ORAL | Status: DC
  Administered 2018-05-24 – 2018-05-26 (×3): 100 mg via ORAL
  Filled 2018-05-23 (×4): qty 1

## 2018-05-23 MED ORDER — ONDANSETRON HCL 4 MG PO TABS
4.0000 mg | ORAL_TABLET | Freq: Four times a day (QID) | ORAL | Status: DC | PRN
  Administered 2018-05-24: 18:00:00 4 mg via ORAL
  Filled 2018-05-23: qty 1

## 2018-05-23 MED ORDER — VANCOMYCIN HCL 10 G IV SOLR
1500.0000 mg | INTRAVENOUS | Status: DC
  Filled 2018-05-23: qty 1500

## 2018-05-23 MED ORDER — SODIUM CHLORIDE 0.9 % IV SOLN
500.0000 mg | INTRAVENOUS | Status: DC
  Administered 2018-05-23: 02:00:00 500 mg via INTRAVENOUS
  Filled 2018-05-23 (×2): qty 500

## 2018-05-23 MED ORDER — VITAMIN B-1 100 MG PO TABS
100.0000 mg | ORAL_TABLET | Freq: Every day | ORAL | Status: DC
  Administered 2018-05-23 – 2018-05-28 (×6): 100 mg via ORAL
  Filled 2018-05-23 (×6): qty 1

## 2018-05-23 MED ORDER — IPRATROPIUM-ALBUTEROL 0.5-2.5 (3) MG/3ML IN SOLN
3.0000 mL | Freq: Three times a day (TID) | RESPIRATORY_TRACT | Status: DC
  Administered 2018-05-23 – 2018-05-28 (×14): 3 mL via RESPIRATORY_TRACT
  Filled 2018-05-23 (×14): qty 3

## 2018-05-23 MED ORDER — INSULIN ASPART 100 UNIT/ML ~~LOC~~ SOLN
0.0000 [IU] | Freq: Three times a day (TID) | SUBCUTANEOUS | Status: DC
  Administered 2018-05-23: 5 [IU] via SUBCUTANEOUS
  Administered 2018-05-23: 3 [IU] via SUBCUTANEOUS
  Administered 2018-05-23 – 2018-05-24 (×2): 5 [IU] via SUBCUTANEOUS
  Administered 2018-05-24: 8 [IU] via SUBCUTANEOUS
  Administered 2018-05-24 – 2018-05-25 (×4): 5 [IU] via SUBCUTANEOUS
  Administered 2018-05-26: 2 [IU] via SUBCUTANEOUS
  Administered 2018-05-26 (×2): 3 [IU] via SUBCUTANEOUS
  Administered 2018-05-27: 2 [IU] via SUBCUTANEOUS
  Administered 2018-05-27: 3 [IU] via SUBCUTANEOUS
  Administered 2018-05-27 – 2018-05-28 (×3): 2 [IU] via SUBCUTANEOUS
  Filled 2018-05-23 (×17): qty 1

## 2018-05-23 MED ORDER — IPRATROPIUM-ALBUTEROL 0.5-2.5 (3) MG/3ML IN SOLN: 3.0000 mL | Freq: Four times a day (QID) | RESPIRATORY_TRACT | Status: DC | PRN

## 2018-05-23 MED ORDER — ACETAMINOPHEN 325 MG PO TABS: 650.0000 mg | ORAL_TABLET | Freq: Four times a day (QID) | ORAL | Status: DC | PRN

## 2018-05-23 MED ORDER — HYDROCODONE-ACETAMINOPHEN 5-325 MG PO TABS: 1.0000 | ORAL_TABLET | ORAL | Status: DC | PRN

## 2018-05-23 MED ORDER — ONDANSETRON HCL 4 MG/2ML IJ SOLN
4.0000 mg | Freq: Four times a day (QID) | INTRAMUSCULAR | Status: DC | PRN
  Administered 2018-05-23 – 2018-05-25 (×3): 4 mg via INTRAVENOUS
  Filled 2018-05-23 (×4): qty 2

## 2018-05-23 MED ORDER — ACETAMINOPHEN 650 MG RE SUPP: 650.0000 mg | Freq: Four times a day (QID) | RECTAL | Status: DC | PRN

## 2018-05-23 MED ORDER — CLINDAMYCIN PHOSPHATE 900 MG/50ML IV SOLN
900.0000 mg | Freq: Three times a day (TID) | INTRAVENOUS | Status: DC
  Administered 2018-05-23 – 2018-05-27 (×11): 900 mg via INTRAVENOUS
  Filled 2018-05-23 (×13): qty 50

## 2018-05-23 MED ORDER — INSULIN ASPART 100 UNIT/ML ~~LOC~~ SOLN
0.0000 [IU] | Freq: Every day | SUBCUTANEOUS | Status: DC
  Administered 2018-05-23: 3 [IU] via SUBCUTANEOUS
  Administered 2018-05-24: 2 [IU] via SUBCUTANEOUS
  Filled 2018-05-23 (×2): qty 1

## 2018-05-23 MED ORDER — POLYETHYLENE GLYCOL 3350 17 G PO PACK: 17.0000 g | PACK | Freq: Every day | ORAL | Status: DC | PRN

## 2018-05-23 MED ORDER — ADULT MULTIVITAMIN W/MINERALS CH
1.0000 | ORAL_TABLET | Freq: Every day | ORAL | Status: DC
  Administered 2018-05-23 – 2018-05-28 (×6): 1 via ORAL
  Filled 2018-05-23 (×6): qty 1

## 2018-05-23 MED ORDER — VANCOMYCIN HCL IN DEXTROSE 1-5 GM/200ML-% IV SOLN: 1000.0000 mg | Freq: Once | INTRAVENOUS | Status: DC

## 2018-05-23 MED ORDER — SODIUM CHLORIDE 0.9 % IV SOLN
2.0000 g | Freq: Two times a day (BID) | INTRAVENOUS | Status: DC
  Administered 2018-05-23: 09:00:00 2 g via INTRAVENOUS
  Filled 2018-05-23 (×2): qty 2

## 2018-05-23 MED ORDER — METRONIDAZOLE IN NACL 5-0.79 MG/ML-% IV SOLN
500.0000 mg | Freq: Three times a day (TID) | INTRAVENOUS | Status: DC
  Administered 2018-05-23: 06:00:00 500 mg via INTRAVENOUS
  Filled 2018-05-23 (×3): qty 100

## 2018-05-23 MED ORDER — SODIUM CHLORIDE 0.9 % IV SOLN
2.0000 g | INTRAVENOUS | Status: DC
  Administered 2018-05-23 – 2018-05-27 (×5): 2 g via INTRAVENOUS
  Filled 2018-05-23: qty 20
  Filled 2018-05-23 (×3): qty 2
  Filled 2018-05-23: qty 20
  Filled 2018-05-23: qty 2

## 2018-05-23 NOTE — Progress Notes (Signed)
Pharmacy Antibiotic Note  Arthur [name] is a 85 y.o. male admitted on 05/22/2018 with pneumonia.  Pharmacy has been consulted for cefepime dosing.  Plan: Will start cefepime 2g IV q12h per CrCl 30 - 60 ml/min  Height: 5\' 11"  (180.3 cm) Weight: 245 lb 9.5 oz (111.4 kg) IBW/kg (Calculated) : 75.3  Temp (24hrs), Avg:99.7 F (37.6 C), Min:99 F (37.2 C), Max:100.3 F (37.9 C)  Recent Labs  Lab 05/22/18 1945 05/22/18 2000  WBC 31.7*  --   CREATININE 2.26*  --   LATICACIDVEN  --  1.9    Estimated Creatinine Clearance: 32 mL/min (A) (by C-G formula based on SCr of 2.26 mg/dL (H)).    Thank you for allowing pharmacy to be a part of this patient's care.  Tobie Lords, PharmD, BCPS Clinical Pharmacist 05/23/2018

## 2018-05-23 NOTE — Progress Notes (Signed)
Hospitalist notified of no admission/signed and held orders; acknowledged; PA typing them up.  OK to give "something to drink". Jessey Stehlin K, RN12:50 AM 05/23/2018

## 2018-05-23 NOTE — Progress Notes (Signed)
Spoke with patient's daughter, [name], x2 today to update on patient progress.

## 2018-05-23 NOTE — Progress Notes (Addendum)
Advanced Care Plan.  Purpose of Encounter: CODE STATUS. Parties in Attendance: The patient, RN and me. Patient's Decisional Capacity: Yes Medical Story: Arthur [name]  is a 85 y.o. male with a known history of COPD, hypertension, diabetes mellitus, hyperlipidemia.  Patient is admitted for sepsis due to leg cellulitis and possible pneumonia.  I discussed with the patient about his current condition, prognosis and CODE STATUS.  The patient does not want to be resuscitated and intubated if he has cardiopulmonary arrest.  This is confirmed by RN. Plan:  Code Status: DNR. Other documents completed: Changed to DNR. Time spent discussing advance care planning: 17 minutes.

## 2018-05-23 NOTE — Progress Notes (Addendum)
[place] [facility] [place] NAME: Arthur [name]    MR#:  [id number]  DATE OF BIRTH:  [birth year]  SUBJECTIVE:  CHIEF COMPLAINT:   Chief Complaint  Patient presents with  . Fever   Right leg redness, tenderness and swelling.  No fever this morning. REVIEW OF SYSTEMS:  Review of Systems  Constitutional: Negative for chills, fever and malaise/fatigue.  HENT: Negative for sore throat.   Eyes: Negative for blurred vision and double vision.  Respiratory: Negative for cough, hemoptysis, shortness of breath, wheezing and stridor.   Cardiovascular: Negative for chest pain, palpitations, orthopnea and leg swelling.  Gastrointestinal: Negative for abdominal pain, blood in stool, diarrhea, melena, nausea and vomiting.  Genitourinary: Negative for dysuria, flank pain and hematuria.  Musculoskeletal: Negative for back pain and joint pain.       Right leg redness, tenderness and swelling  Skin: Negative for rash.  Neurological: Negative for dizziness, sensory change, focal weakness, seizures, loss of consciousness, weakness and headaches.  Endo/Heme/Allergies: Negative for polydipsia.  Psychiatric/Behavioral: Negative for depression. The patient is not nervous/anxious.     DRUG ALLERGIES:  Not on File VITALS:  Blood pressure (!) 127/49, pulse 74, temperature 98.2 F (36.8 C), temperature source Oral, resp. rate (!) 22, height 5\' 11"  (1.803 m), weight 111.4 kg, SpO2 100 %. PHYSICAL EXAMINATION:  Physical Exam Constitutional:      General: He is not in acute distress.    Appearance: Normal appearance.  HENT:     Head: Normocephalic.     Mouth/Throat:     Mouth: Mucous membranes are moist.  Eyes:     General: No scleral icterus.    Conjunctiva/sclera: Conjunctivae normal.     Pupils: Pupils are equal, round, and reactive to light.  Neck:     Musculoskeletal: Normal range of motion and neck supple.     Vascular: No JVD.     Trachea: No tracheal  deviation.  Cardiovascular:     Rate and Rhythm: Normal rate and regular rhythm.     Heart sounds: Normal heart sounds. No murmur. No gallop.   Pulmonary:     Effort: Pulmonary effort is normal. No respiratory distress.     Breath sounds: Normal breath sounds. No wheezing or rales.  Abdominal:     General: Bowel sounds are normal. There is no distension.     Palpations: Abdomen is soft.     Tenderness: There is no abdominal tenderness. There is no rebound.  Musculoskeletal: Normal range of motion.        General: No tenderness.     Right lower leg: No edema.     Left lower leg: No edema.     Comments: Right leg redness, tenderness and swelling  Skin:    Findings: No erythema or rash.  Neurological:     General: No focal deficit present.     Mental Status: He is alert and oriented to person, place, and time.     Cranial Nerves: No cranial nerve deficit.  Psychiatric:        Mood and Affect: Mood normal.    LABORATORY PANEL:  Male CBC Recent Labs  Lab 05/23/18 0401  WBC 23.9*  HGB 8.0*  HCT 25.5*  PLT 205   ------------------------------------------------------------------------------------------------------------------ Chemistries  Recent Labs  Lab 05/22/18 1945 05/23/18 0401  NA 137 137  K 4.5 4.2  CL 103 105  CO2 25 25  GLUCOSE 244* 224*  BUN 46* 48*  CREATININE  2.26* 2.30*  CALCIUM 8.1* 7.5*  AST 18  --   ALT 12  --   ALKPHOS 53  --   BILITOT 1.0  --    RADIOLOGY:  US Venous Img Lower Bilateral  Result Date: 05/22/2018 CLINICAL DATA:  85 year old male with lower extremity edema and color changes with no known injury. EXAM: BILATERAL LOWER EXTREMITY VENOUS DOPPLER ULTRASOUND TECHNIQUE: Gray-scale sonography with graded compression, as well as color Doppler and duplex ultrasound were performed to evaluate the lower extremity deep venous systems from the level of the common femoral vein and including the common femoral, femoral, profunda femoral, popliteal  and calf veins including the posterior tibial, peroneal and gastrocnemius veins when visible. The superficial great saphenous vein was also interrogated. Spectral Doppler was utilized to evaluate flow at rest and with distal augmentation maneuvers in the common femoral, femoral and popliteal veins. COMPARISON:  None. FINDINGS: RIGHT LOWER EXTREMITY Common Femoral Vein: No evidence of thrombus. Normal compressibility, respiratory phasicity and response to augmentation. Saphenofemoral Junction: No evidence of thrombus. Normal compressibility and flow on color Doppler imaging. Profunda Femoral Vein: No evidence of thrombus. Normal compressibility and flow on color Doppler imaging. Femoral Vein: No evidence of thrombus. Normal compressibility, respiratory phasicity and response to augmentation. Popliteal Vein: No evidence of thrombus. Normal compressibility, respiratory phasicity and response to augmentation. Calf Veins: Visible are without evidence of thrombus. Normal compressibility and flow on color Doppler imaging. Other Findings:  None. LEFT LOWER EXTREMITY Common Femoral Vein: No evidence of thrombus. Normal compressibility, respiratory phasicity and response to augmentation. Saphenofemoral Junction: No evidence of thrombus. Normal compressibility and flow on color Doppler imaging. Profunda Femoral Vein: No evidence of thrombus. Normal compressibility and flow on color Doppler imaging. Femoral Vein: No evidence of thrombus. Normal compressibility, respiratory phasicity and response to augmentation. Popliteal Vein: No evidence of thrombus. Normal compressibility, respiratory phasicity and response to augmentation. Calf Veins: Visible are without evidence of thrombus. Normal compressibility and flow on color Doppler imaging. Other Findings:  None. IMPRESSION: No evidence of deep venous thrombosis in either lower extremity. Electronically Signed   By: Genevie Ann M.D.   On: 05/22/2018 23:35   Dg Chest Port 1 View   Result Date: 05/22/2018 CLINICAL DATA:  Sepsis fever productive cough EXAM: PORTABLE CHEST 1 VIEW COMPARISON:  None. FINDINGS: Heart size upper limits of normal. No consolidation or effusion. Diffuse interstitial opacity. No pneumothorax. Minimal atelectasis right base. IMPRESSION: Mild diffuse increased interstitial opacity suggesting interstitial inflammatory process. Mild atelectasis at the right base Electronically Signed   By: Donavan Foil M.D.   On: 05/22/2018 20:05   ASSESSMENT AND PLAN:   1.  Sepsis due to leg cellulitis, possible pneumonia and strep bacteremia. - Patient was started on broad-spectrum IV antibiotic therapy with cefepime, vancomycin, and azithromycin for right leg cellulitis and possible community-acquired pneumonia He received IV fluid bolus in the emergency room per protocol with continued normal saline. Blood cultures show Streptococcus species.  Follow-up CBC. Changed to Rocephin 2 g IV.  2.  Right lower extremity cellulitis Antibiotics as above. - Bilateral lower extremity ultrasound negative for DVT  3.  Community-acquired pneumonia Antibiotics as above.  DuoNeb.  Acute respiratory failure with hypoxia due to above.  Improved off oxygen.   4.  Diabetes mellitus - Moderate sliding scale insulin - Hemoglobin A1c 7.1.  5.  Acute kidney injury on CKD stage III. - Likely secondary to sepsis with possible dehydration Continue IV fluid support and follow-up BMP.  6.  COPD Stable, DuoNeb as needed.  Generalized weakness.  Ambulate patient and PT evaluation.  All the records are reviewed and case discussed with Care Management/Social Worker. Management plans discussed with the patient, family and they are in agreement.  CODE STATUS: DNR  TOTAL TIME TAKING CARE OF THIS PATIENT: 28 minutes.   More than 50% of the time was spent in counseling/coordination of care: YES  POSSIBLE D/C IN 2-3 DAYS, DEPENDING ON CLINICAL CONDITION.   Demetrios Loll M.D on  05/23/2018 at 1:14 PM  Between 7am to 6pm - Pager - [phone]  After 6pm go to [email or web address] - [employer] [facility]

## 2018-05-23 NOTE — Consult Note (Signed)
NAME: RODRIQUEZ THORNER  DOB: Sep 12, 1933  MRN: [id number]  Date/Time: 05/23/2018 5:18 PM  REQUESTING PROVIDER: Bridgett Larsson Subjective:  REASON FOR CONSULT: bacteremia ? KYLOR VALVERDE is a 85 y.o. male with a history  of COPD, hypertension, diabetes mellitus, hyperlipidemia.  He presented to the emergency room complaining of feeling weak and tired and his daughter found him to be lethargic with increased shortness of breath and edema of bilateral lower extremities. EMS recorded a temp of 101.8. In the ED BP 160/49, temp of 100.3, HR 94 He was started on vanco, cefepime after sending blood culture- I am asked to see the patient for positive blood culture  PMH DM with nephropathy Stasis edema and stasis ulcer leg rt Anemia HTN HLD BPH COPD Morbid obesity CKD  PSH Amputation rt index finger  SH : former smoker Chewed tobacco  FH DM father and mother ? Current Facility-Administered Medications  Medication Dose Route Frequency Provider Last Rate Last Dose  . 0.9 %  sodium chloride infusion   Intravenous PRN Demetrios Loll, MD   Stopped at 05/23/18 1030  . acetaminophen (TYLENOL) tablet 650 mg  650 mg Oral Q6H PRN Seals, Theo Dills, NP       Or  . acetaminophen (TYLENOL) suppository 650 mg  650 mg Rectal Q6H PRN Seals, Theo Dills, NP      . azithromycin (ZITHROMAX) 500 mg in sodium chloride 0.9 % 250 mL IVPB  500 mg Intravenous Q24H Seals, Theo Dills, NP   Stopped at 05/23/18 0256  . cefTRIAXone (ROCEPHIN) 2 g in sodium chloride 0.9 % 100 mL IVPB  2 g Intravenous Q24H Demetrios Loll, MD 200 mL/hr at 05/23/18 1714 2 g at 05/23/18 1714  . enoxaparin (LOVENOX) injection 30 mg  30 mg Subcutaneous Q24H Seals, Angela H, NP   30 mg at 05/23/18 0536  . folic acid (FOLVITE) tablet 1 mg  1 mg Oral Daily Seals, Theo Dills, NP   1 mg at 05/23/18 0853  . HYDROcodone-acetaminophen (NORCO/VICODIN) 5-325 MG per tablet 1-2 tablet  1-2 tablet Oral Q4H PRN Seals, Angela H, NP      . insulin aspart (novoLOG) injection 0-15 Units   0-15 Units Subcutaneous TID WC Seals, Theo Dills, NP   5 Units at 05/23/18 1711  . insulin aspart (novoLOG) injection 0-5 Units  0-5 Units Subcutaneous QHS Seals, Angela H, NP      . ipratropium-albuterol (DUONEB) 0.5-2.5 (3) MG/3ML nebulizer solution 3 mL  3 mL Nebulization Q6H Seals, Angela H, NP   3 mL at 05/23/18 1434  . multivitamin with minerals tablet 1 tablet  1 tablet Oral Daily Seals, Theo Dills, NP   1 tablet at 05/23/18 [phone]  . ondansetron (ZOFRAN) tablet 4 mg  4 mg Oral Q6H PRN Seals, Theo Dills, NP       Or  . ondansetron (ZOFRAN) injection 4 mg  4 mg Intravenous Q6H PRN Seals, Angela H, NP      . polyethylene glycol (MIRALAX / GLYCOLAX) packet 17 g  17 g Oral Daily PRN Seals, Levada Dy H, NP      . thiamine (VITAMIN B-1) tablet 100 mg  100 mg Oral Daily Seals, Angela H, NP   100 mg at 05/23/18 0853  . [START ON 05/24/2018] vancomycin (VANCOCIN) 1,500 mg in sodium chloride 0.9 % 500 mL IVPB  1,500 mg Intravenous Q48H Dallie Piles, RPH         Abtx:  Anti-infectives (From admission, onward)   Start  Dose/Rate Route Frequency Ordered Stop   05/24/18 0600  vancomycin (VANCOCIN) 1,500 mg in sodium chloride 0.9 % 500 mL IVPB     1,500 mg 250 mL/hr over 120 Minutes Intravenous Every 48 hours 05/23/18 1317     05/23/18 1800  cefTRIAXone (ROCEPHIN) 2 g in sodium chloride 0.9 % 100 mL IVPB     2 g 200 mL/hr over 30 Minutes Intravenous Every 24 hours 05/23/18 1126     05/23/18 1000  ceFEPIme (MAXIPIME) 2 g in sodium chloride 0.9 % 100 mL IVPB  Status:  Discontinued     2 g 200 mL/hr over 30 Minutes Intravenous Every 12 hours 05/23/18 0104 05/23/18 1126   05/23/18 0630  metroNIDAZOLE (FLAGYL) IVPB 500 mg  Status:  Discontinued     500 mg 100 mL/hr over 60 Minutes Intravenous Every 8 hours 05/23/18 0104 05/23/18 0806   05/23/18 0130  azithromycin (ZITHROMAX) 500 mg in sodium chloride 0.9 % 250 mL IVPB     500 mg 250 mL/hr over 60 Minutes Intravenous Every 24 hours 05/23/18 0104      05/23/18 0115  vancomycin (VANCOCIN) IVPB 1000 mg/200 mL premix  Status:  Discontinued     1,000 mg 200 mL/hr over 60 Minutes Intravenous  Once 05/23/18 0104 05/23/18 0112   05/22/18 2130  vancomycin (VANCOCIN) 2,500 mg in sodium chloride 0.9 % 500 mL IVPB     2,500 mg 250 mL/hr over 120 Minutes Intravenous  Once 05/22/18 2121 05/23/18 0152   05/22/18 2100  ceFEPIme (MAXIPIME) 2 g in sodium chloride 0.9 % 100 mL IVPB     2 g 200 mL/hr over 30 Minutes Intravenous  Once 05/22/18 2045 05/22/18 2222   05/22/18 2100  metroNIDAZOLE (FLAGYL) IVPB 500 mg     500 mg 100 mL/hr over 60 Minutes Intravenous  Once 05/22/18 2045 05/22/18 2326   05/22/18 2100  vancomycin (VANCOCIN) IVPB 1000 mg/200 mL premix  Status:  Discontinued     1,000 mg 200 mL/hr over 60 Minutes Intravenous  Once 05/22/18 2045 05/22/18 2121      REVIEW OF SYSTEMS:  Const: negative fever, negative chills, negative weight loss Eyes: negative diplopia or visual changes, negative eye pain ENT: negative coryza, negative sore throat Resp:chronic cough, hemoptysis,some  dyspnea Cards: negative for chest pain, palpitations, has lower extremity edema GU: has  frequency, no dysuria and hematuria GI: Negative for abdominal pain, diarrhea, bleeding, constipation Skin: edema legs and erythema  Heme: negative for easy bruising and gum/nose bleeding MS: myalgias, arthralgias, back pain and muscle weakness Neurolo:negative for headaches, dizziness, vertigo, memory problems  Psych: negative for feelings of anxiety, depression   Allergy/Immunology-as above  Objective:  VITALS:  BP (!) 127/49 (BP Location: Left Arm) Comment: Reported to RN Nurse Marcella.  Pulse 74   Temp 98.2 F (36.8 C) (Oral)   Resp (!) 22   Ht 5\' 11"  (1.803 m)   Wt 111.4 kg   SpO2 100%   BMI 34.25 kg/m  PHYSICAL EXAM:  General: Alert, cooperative, no distress, appears stated age.  Head: Normocephalic, without obvious abnormality, atraumatic. Eyes:  Conjunctivae clear, anicteric sclerae. Pupils are equal ENT Nares normal. No drainage or sinus tenderness. Lips, mucosa, and tongue normal. No Thrush, edentulous Neck: Supple, symmetrical, no adenopathy, thyroid: non tender no carotid bruit and no JVD. Back: No CVA tenderness. Lungs: Clear to auscultation bilaterally. No Wheezing or Rhonchi. No rales. Heart: Regular rate and rhythm, no murmur, rub or gallop. Abdomen: Soft, non-tender,not distended. Bowel  sounds normal. No masses Extremities: rt leg erythematous over the shin and some erythema ascending over the medial aspect of thigh. Left leg shin has sime thickened skin B/l erythematous scales over the feet            Skin: No rashes or lesions. Or bruising Lymph: Cervical, supraclavicular normal. Neurologic: Grossly non-focal Pertinent Labs Lab Results CBC    Component Value Date/Time   WBC 23.9 (H) 05/23/2018 0401   RBC 2.60 (L) 05/23/2018 0401   HGB 8.0 (L) 05/23/2018 0401   HGB 12.1 (L) 10/29/2012 0959   HCT 25.5 (L) 05/23/2018 0401   PLT 205 05/23/2018 0401   MCV 98.1 05/23/2018 0401   MCH 30.8 05/23/2018 0401   MCHC 31.4 05/23/2018 0401   RDW 18.4 (H) 05/23/2018 0401   LYMPHSABS 1.0 05/22/2018 1945   MONOABS 1.5 (H) 05/22/2018 1945   EOSABS 0.1 05/22/2018 1945   BASOSABS 0.1 05/22/2018 1945    CMP Latest Ref Rng & Units 05/23/2018 05/22/2018 10/29/2012  Glucose 70 - 99 mg/dL 224(H) 244(H) 102(H)  BUN 8 - 23 mg/dL 48(H) 46(H) 25(H)  Creatinine 0.61 - 1.24 mg/dL 2.30(H) 2.26(H) 1.53(H)  Sodium 135 - 145 mmol/L 137 137 139  Potassium 3.5 - 5.1 mmol/L 4.2 4.5 3.8  Chloride 98 - 111 mmol/L 105 103 104  CO2 22 - 32 mmol/L 25 25 31   Calcium 8.9 - 10.3 mg/dL 7.5(L) 8.1(L) 8.6  Total Protein 6.5 - 8.1 g/dL - 7.2 -  Total Bilirubin 0.3 - 1.2 mg/dL - 1.0 -  Alkaline Phos 38 - 126 U/L - 53 -  AST 15 - 41 U/L - 18 -  ALT 0 - 44 U/L - 12 -      Microbiology: Recent Results (from the past 240 hour(s))  Blood  Culture (routine x 2)     Status: None (Preliminary result)   Collection Time: 05/22/18  7:46 PM  Result Value Ref Range Status   Specimen Description BLOOD BLOOD RIGHT WRIST  Final   Special Requests   Final    BOTTLES DRAWN AEROBIC AND ANAEROBIC Blood Culture results may not be optimal due to an excessive volume of blood received in culture bottles   Culture  Setup Time   Final    IN BOTH AEROBIC AND ANAEROBIC BOTTLES GRAM POSITIVE COCCI IN CHAINS CRITICAL RESULT CALLED TO, READ BACK BY AND VERIFIED WITH: WALID NAZIRI AT 7672 ON 05/23/2018 [place]. Performed at [facility], [address] [place], [place] [address]    Culture GRAM POSITIVE COCCI IN CHAINS  Final   Report Status PENDING  Incomplete  SARS Coronavirus 2 (CEPHEID- Performed in [place] hospital lab), Hosp Order     Status: None   Collection Time: 05/22/18  8:27 PM  Result Value Ref Range Status   SARS Coronavirus 2 NEGATIVE NEGATIVE Final    Comment: (NOTE) If result is NEGATIVE SARS-CoV-2 target nucleic acids are NOT DETECTED. The SARS-CoV-2 RNA is generally detectable in upper and lower  respiratory specimens during the acute phase of infection. The lowest  concentration of SARS-CoV-2 viral copies this assay can detect is 250  copies / mL. A negative result does not preclude SARS-CoV-2 infection  and should not be used as the sole basis for treatment or other  patient management decisions.  A negative result may occur with  improper specimen collection / handling, submission of specimen other  than nasopharyngeal swab, presence of viral mutation(s) within the  areas targeted by this  assay, and inadequate number of viral copies  (<250 copies / mL). A negative result must be combined with clinical  observations, patient history, and epidemiological information. If result is POSITIVE SARS-CoV-2 target nucleic acids are DETECTED. The SARS-CoV-2 RNA is generally detectable in upper and lower  respiratory  specimens dur ing the acute phase of infection.  Positive  results are indicative of active infection with SARS-CoV-2.  Clinical  correlation with patient history and other diagnostic information is  necessary to determine patient infection status.  Positive results do  not rule out bacterial infection or co-infection with other viruses. If result is PRESUMPTIVE POSTIVE SARS-CoV-2 nucleic acids MAY BE PRESENT.   A presumptive positive result was obtained on the submitted specimen  and confirmed on repeat testing.  While 2019 novel coronavirus  (SARS-CoV-2) nucleic acids may be present in the submitted sample  additional confirmatory testing may be necessary for epidemiological  and / or clinical management purposes  to differentiate between  SARS-CoV-2 and other Sarbecovirus currently known to infect humans.  If clinically indicated additional testing with an alternate test  methodology [phone]) is advised. The SARS-CoV-2 RNA is generally  detectable in upper and lower respiratory sp ecimens during the acute  phase of infection. The expected result is Negative. Fact Sheet for Patients:  StrictlyIdeas.no Fact Sheet for Healthcare Providers: BankingDealers.co.za This test is not yet approved or cleared by the [place] FDA and has been authorized for detection and/or diagnosis of SARS-CoV-2 by FDA under an Emergency Use Authorization (EUA).  This EUA will remain in effect (meaning this test can be used) for the duration of the COVID-19 declaration under Section 564(b)(1) of the Act, 21 U.S.C. section 360bbb-3(b)(1), unless the authorization is terminated or revoked sooner. Performed at [facility], [address] [place], [place] [address]   Blood Culture (routine x 2)     Status: None (Preliminary result)   Collection Time: 05/22/18  9:25 PM  Result Value Ref Range Status   Specimen Description BLOOD RIGHT ANTECUBITAL   Final   Special Requests   Final    BOTTLES DRAWN AEROBIC AND ANAEROBIC Blood Culture results may not be optimal due to an excessive volume of blood received in culture bottles   Culture  Setup Time   Final    Organism ID to follow ANAEROBIC BOTTLE ONLY GRAM POSITIVE COCCI IN CHAINS CRITICAL RESULT CALLED TO, READ BACK BY AND VERIFIED WITH: WALID NAZIRI AT 3546 ON 05/23/2018 [place]. AEROBIC BOTTLE ONLY NO ORGANISMS SEEN NOTIFIED WALID NAZIRI AT 5681 ON 05/23/2018 THAT WILL BE SENT FOR CULTURE. [facility] Performed at [facility], [address] [place], [place] [address]    Culture GRAM POSITIVE COCCI IN CHAINS  Final   Report Status PENDING  Incomplete  Blood Culture ID Panel (Reflexed)     Status: Abnormal   Collection Time: 05/22/18  9:25 PM  Result Value Ref Range Status   Enterococcus species NOT DETECTED NOT DETECTED Final   Listeria monocytogenes NOT DETECTED NOT DETECTED Final   Staphylococcus species NOT DETECTED NOT DETECTED Final   Staphylococcus aureus (BCID) NOT DETECTED NOT DETECTED Final   Streptococcus species DETECTED (A) NOT DETECTED Final    Comment: Not Enterococcus species, Streptococcus agalactiae, Streptococcus pyogenes, or Streptococcus pneumoniae. CRITICAL RESULT CALLED TO, READ BACK BY AND VERIFIED WITH: WALID NAZIRI AT 0017 ON 05/23/2018 [place].    Streptococcus agalactiae NOT DETECTED NOT DETECTED Final   Streptococcus pneumoniae NOT DETECTED NOT DETECTED Final   Streptococcus  pyogenes NOT DETECTED NOT DETECTED Final   Acinetobacter baumannii NOT DETECTED NOT DETECTED Final   Enterobacteriaceae species NOT DETECTED NOT DETECTED Final   Enterobacter cloacae complex NOT DETECTED NOT DETECTED Final   Escherichia coli NOT DETECTED NOT DETECTED Final   Klebsiella oxytoca NOT DETECTED NOT DETECTED Final   Klebsiella pneumoniae NOT DETECTED NOT DETECTED Final   Proteus species NOT DETECTED NOT DETECTED Final   Serratia marcescens NOT DETECTED NOT DETECTED Final    Haemophilus influenzae NOT DETECTED NOT DETECTED Final   Neisseria meningitidis NOT DETECTED NOT DETECTED Final   Pseudomonas aeruginosa NOT DETECTED NOT DETECTED Final   Candida albicans NOT DETECTED NOT DETECTED Final   Candida glabrata NOT DETECTED NOT DETECTED Final   Candida krusei NOT DETECTED NOT DETECTED Final   Candida parapsilosis NOT DETECTED NOT DETECTED Final   Candida tropicalis NOT DETECTED NOT DETECTED Final    Comment: Performed at [facility], [address] [place], [place] [address]  Culture, sputum-assessment     Status: None   Collection Time: 05/23/18  7:04 AM  Result Value Ref Range Status   Specimen Description SPUTUM  Final   Special Requests Normal  Final   Sputum evaluation   Final    THIS SPECIMEN IS ACCEPTABLE FOR SPUTUM CULTURE Performed at [facility], [address], [place], [place] [address]    Report Status 05/23/2018 FINAL  Final  Culture, respiratory     Status: None (Preliminary result)   Collection Time: 05/23/18  7:04 AM  Result Value Ref Range Status   Specimen Description   Final    SPUTUM Performed at [facility], [address] [place], [place] [address]    Special Requests   Final    Normal Reflexed from [id number] Performed at [facility], [place], [place] [address]    Gram Stain   Final    NO WBC SEEN RARE GRAM POSITIVE COCCI IN PAIRS RARE GRAM POSITIVE RODS Performed at [facility], [place] [address], [place], [place] [address]    Culture PENDING  Incomplete   Report Status PENDING  Incomplete    IMAGING RESULTS:  I have personally reviewed the films ? Impression/Recommendation ?85 y.o. male with a history  of COPD, hypertension, diabetes mellitus, hyperlipidemia.  He presented to the emergency room complaining of feeling weak and tired and his daughter found him to be lethargic with increased shortness of breath and edema of bilateral lower extremities. EMS  recorded a temp of 101.8. In the ED BP 160/49, temp of 100.3, HR 94  streptococcus bacteremia secondary to Cellulitis rt leg- on ceftriaxone. Will add clindamycin  B/l fungal infection feet- will add fluconazole  Cough- no obvious infiltrate- DC azithromycin  DM- management as per primary team  ? ? ___________________________________________________ Discussed with patient,

## 2018-05-23 NOTE — Evaluation (Signed)
Physical Therapy Evaluation Patient Details Name: Arthur [name] MRN: [id number] DOB: 13-Oct-1933 Today's Date: 05/23/2018   History of Present Illness   85 y.o. male with a known history of COPD, hypertension, diabetes mellitus, hyperlipidemia.  He presented to the emergency room complaining of fever with increased shortness of breath and edema of bilateral lower extremities.  Admitted with cellulitis and possible pneumonia.   Clinical Impression  Pt confident in his ability to do prolonged bout of ambulation and actually did fairly well, states that he actually walked better than his normal.  He did have a more shuffling gait and occasionally seemed to be leaning forward too much but did not have any LOBs significant enough to warrant physical assist from PT.  However he did have some fatigue with the effort (more distance than he ever typically does) and needed a standing rest break, though O2 sats remained stable in the low 90s/high 80s on room air.  PT did discussed increasing regularity of AD use in the home (has SPC and walker), and a trial with steps (he has many to enter his home) and ambulation with SPC would be appropriate next session (pt did not want RW this session).    Follow Up Recommendations Home health PT    Equipment Recommendations  (he has ADs, discussed possibility of more regular use)    Recommendations for Other Services       Precautions / Restrictions Precautions Precautions: Fall Restrictions Weight Bearing Restrictions: No      Mobility  Bed Mobility Overal bed mobility: Needs Assistance Bed Mobility: Supine to Sit     Supine to sit: Min assist     General bed mobility comments: Pt reports that he sleeps in recliner, never has to do bed mobility  Transfers Overall transfer level: Independent               General transfer comment: Pt able to rise with good relative confidence and did not need UEs to maintain  balance  Ambulation/Gait Ambulation/Gait assistance: Min guard Gait Distance (Feet): 200 Feet Assistive device: None     Gait velocity interpretation: <1.31 ft/sec, indicative of household ambulator General Gait Details: Pt with minimal foot clearance, but was able to maintain consistent cadence with no LOBs. He did have a few moments of forward lean and apparent difficulty keeping feet underneath him but was able to self correct with minimal tactile cuing; ultimately reported being close to his baseline.  His O2 was in the low 90s/high 80s t/o the effort with no signifiacnt change with prolonged bout of ambulation.  Stairs            Wheelchair Mobility    Modified Rankin (Stroke Patients Only)       Balance Overall balance assessment: Modified Independent;Mild deficits observed, not formally tested                                           Pertinent Vitals/Pain Pain Assessment: 0-10 Pain Score: 4     Home Living Family/patient expects to be discharged to:: Private residence Living Arrangements: Alone Available Help at Discharge: Family(daughter checks in QD)   Home Access: Stairs to enter Entrance Stairs-Rails: (yes) Entrance Stairs-Number of Steps: 8   Home Equipment: Walker - 2 wheels;Cane - single point      Prior Function Level of Independence: Needs assistance   Gait / Transfers  Assistance Needed: Pt ambulates in the home w/o AD, almost never out of the home, daughter grocery shops, etc           Hand Dominance        Extremity/Trunk Assessment   Upper Extremity Assessment Upper Extremity Assessment: Generalized weakness(age appropriate limtiations)    Lower Extremity Assessment Lower Extremity Assessment: Overall WFL for tasks assessed;Generalized weakness       Communication   Communication: No difficulties  Cognition Arousal/Alertness: Awake/alert Behavior During Therapy: WFL for tasks assessed/performed Overall  Cognitive Status: Within Functional Limits for tasks assessed                                        General Comments      Exercises     Assessment/Plan    PT Assessment Patient needs continued PT services  PT Problem List Decreased activity tolerance;Decreased balance;Decreased strength;Decreased range of motion;Decreased mobility;Decreased safety awareness;Cardiopulmonary status limiting activity       PT Treatment Interventions Gait training;Stair training;Functional mobility training;Therapeutic activities;Therapeutic exercise;Balance training;Cognitive remediation;Patient/family education;DME instruction    PT Goals (Current goals can be found in the Care Plan section)  Acute Rehab PT Goals Patient Stated Goal: go home PT Goal Formulation: With patient Time For Goal Achievement: 06/06/18 Potential to Achieve Goals: Good    Frequency Min 2X/week   Barriers to discharge        Co-evaluation               AM-PAC PT "6 Clicks" Mobility  Outcome Measure Help needed turning from your back to your side while in a flat bed without using bedrails?: A Little Help needed moving from lying on your back to sitting on the side of a flat bed without using bedrails?: A Little Help needed moving to and from a bed to a chair (including a wheelchair)?: None Help needed standing up from a chair using your arms (e.g., wheelchair or bedside chair)?: None Help needed to walk in hospital room?: A Little Help needed climbing 3-5 steps with a railing? : A Little 6 Click Score: 20    End of Session Equipment Utilized During Treatment: Gait belt Activity Tolerance: Patient limited by fatigue;Patient tolerated treatment well Patient left: with bed alarm set;with call bell/phone within reach   PT Visit Diagnosis: Difficulty in walking, not elsewhere classified (R26.2);Muscle weakness (generalized) (M62.81)    Time: 0814-4818 PT Time Calculation (min) (ACUTE ONLY): 27  min   Charges:   PT Evaluation $PT Eval Low Complexity: 1 Low PT Treatments $Gait Training: 8-22 mins        Kreg Shropshire, DPT 05/23/2018, 2:47 PM

## 2018-05-23 NOTE — Consult Note (Signed)
Pharmacy Antibiotic Note  Arthur [name] is a 85 y.o. male admitted on 05/22/2018 with cellulitis and PNA.  Pharmacy has been consulted for vancomycin dosing. Since admission his blood has grown out Streptococcus species. He was started on vancomycin, cefepime, azithromycin and metronidazole but is now on ceftriaxone, vancomycin and azithromycin. His SCr is slightly elevated above his baseline of approximately 2.0 mg/dL  Plan: 1) start vancomycin 1500 mg IV Q 48 hrs beginning 5/22 at 0600 Loading dose of 2500mg  administered T 1/2: 26.4h, Goal AUC 400-550 Expected AUC: 514 SCr used: 2.3   Height: 5\' 11"  (180.3 cm) Weight: 245 lb 9.5 oz (111.4 kg) IBW/kg (Calculated) : 75.3  Temp (24hrs), Avg:99.2 F (37.3 C), Min:98.2 F (36.8 C), Max:100.3 F (37.9 C)  Recent Labs  Lab 05/22/18 1945 05/22/18 2000 05/23/18 0401  WBC 31.7*  --  23.9*  CREATININE 2.26*  --  2.30*  LATICACIDVEN  --  1.9  --     Estimated Creatinine Clearance: 31.4 mL/min (A) (by C-G formula based on SCr of 2.3 mg/dL (H)).    Not on File  Antimicrobials this admission: ceftriaxone 5/21 >>  vancomycin 5/20 >>  Azithromycin 5/20 >> Metronidazole 5/20 >> 5/21 Cefepime 5/20 >> 5/21  Microbiology results: 5/20 BCx: 3/4 Streptococcus species.  5/20 UCx: pending  5/21 Sputum: pending   Thank you for allowing pharmacy to be a part of this patient's care.  Dallie Piles, PharmD 05/23/2018 1:03 PM

## 2018-05-23 NOTE — Progress Notes (Signed)
PHARMACY - PHYSICIAN COMMUNICATION CRITICAL VALUE ALERT - BLOOD CULTURE IDENTIFICATION (BCID)  Arthur [name] is an 85 y.o. male who presented to [facility] on 05/22/2018 with a chief complaint of fever. Patient admitted with concern for CAP and cellulitis.  Assessment:  3/4 bottles strep species  Name of physician (or Provider) Contacted: Dr. Bridgett Larsson  Current antibiotics: vanc/cefepime/azithromycin  Changes to prescribed antibiotics recommended: change cefepime to Rocephin  Results for orders placed or performed during the hospital encounter of 05/22/18  Blood Culture ID Panel (Reflexed) (Collected: 05/22/2018  9:25 PM)  Result Value Ref Range   Enterococcus species NOT DETECTED NOT DETECTED   Listeria monocytogenes NOT DETECTED NOT DETECTED   Staphylococcus species NOT DETECTED NOT DETECTED   Staphylococcus aureus (BCID) NOT DETECTED NOT DETECTED   Streptococcus species DETECTED (A) NOT DETECTED   Streptococcus agalactiae NOT DETECTED NOT DETECTED   Streptococcus pneumoniae NOT DETECTED NOT DETECTED   Streptococcus pyogenes NOT DETECTED NOT DETECTED   Acinetobacter baumannii NOT DETECTED NOT DETECTED   Enterobacteriaceae species NOT DETECTED NOT DETECTED   Enterobacter cloacae complex NOT DETECTED NOT DETECTED   Escherichia coli NOT DETECTED NOT DETECTED   Klebsiella oxytoca NOT DETECTED NOT DETECTED   Klebsiella pneumoniae NOT DETECTED NOT DETECTED   Proteus species NOT DETECTED NOT DETECTED   Serratia marcescens NOT DETECTED NOT DETECTED   Haemophilus influenzae NOT DETECTED NOT DETECTED   Neisseria meningitidis NOT DETECTED NOT DETECTED   Pseudomonas aeruginosa NOT DETECTED NOT DETECTED   Candida albicans NOT DETECTED NOT DETECTED   Candida glabrata NOT DETECTED NOT DETECTED   Candida krusei NOT DETECTED NOT DETECTED   Candida parapsilosis NOT DETECTED NOT DETECTED   Candida tropicalis NOT DETECTED NOT DETECTED   Tawnya Crook, PharmD Pharmacy Resident  05/23/2018 11:27  AM

## 2018-05-23 NOTE — H&P (Addendum)
[facility] [place] NAME: Arthur [name]    MR#:  [id number]  DATE OF BIRTH:  05-28-1933  DATE OF ADMISSION:  05/22/2018  PRIMARY CARE PHYSICIAN: Baxter Hire, MD   REQUESTING/REFERRING Renella Cunas, MD  CHIEF COMPLAINT:   Chief Complaint  Patient presents with  . Fever    HISTORY OF PRESENT ILLNESS:  Arthur [name]  is a 85 y.o. male with a known history of COPD, hypertension, diabetes mellitus, hyperlipidemia.  He presented to the emergency room complaining of fever with increased shortness of breath and edema of bilateral lower extremities.  He reports a subjective fever at home.  Temperature was 2.3 on arrival to the emergency room.  He has also noted mild lower extremity edema present for 2 to 3 weeks with increasing anterior erythema below the knee.  He denies having been treated for cellulitis of his lower extremities recently.  He denies a history of pulmonary embolism or DVT.  Bilateral lower extremity venous Doppler studies were completed with no evidence of DVT.  Patient also endorses a productive cough present for about 3 weeks.  Patient is uncertain the character of the mucus.  He has a history of COPD.  He is not on home oxygen therapy.  He denies abdominal pain.  He denies chest pain.  He denies nausea or vomiting.  He denies diarrhea.  He denies hematemesis, hematochezia, or melena.  Initial labs demonstrate lactic acid of 1.9 with leukocytosis present with WBC 31.7.  Creatinine is 2.26 with BUN of 46.  Patient also has a history of diabetes.  Glucose on arrival is 244.  COVID-19 testing is negative.  Chest x-ray demonstrates interstitial opacities.  He has been admitted for right lower extremity cellulitis and possible community-acquired pneumonia.  PAST MEDICAL HISTORY:   Past Medical History:  Diagnosis Date  . COPD (chronic obstructive pulmonary disease) ([place])   . Diabetes mellitus without complication ([place])    . Hyperlipidemia   . Hypertension     PAST SURGICAL HISTORY:  History reviewed. No pertinent surgical history.  SOCIAL HISTORY:   Social History   Tobacco Use  . Smoking status: Former Smoker    Last attempt to quit: 11/04/1982    Years since quitting: 35.5  . Smokeless tobacco: Current User  Substance Use Topics  . Alcohol use: Yes    Comment: 1/5 /wk    FAMILY HISTORY:  History reviewed. No pertinent family history.  DRUG ALLERGIES:  Not on File  REVIEW OF SYSTEMS:   Review of Systems  Constitutional: Positive for chills, fever and malaise/fatigue. Negative for diaphoresis.  HENT: Positive for congestion. Negative for sinus pain and sore throat.   Eyes: Negative for blurred vision and double vision.  Respiratory: Positive for cough, sputum production and shortness of breath. Negative for hemoptysis and wheezing.   Cardiovascular: Positive for leg swelling (Bilateral lower extremity). Negative for chest pain and palpitations.  Gastrointestinal: Negative for abdominal pain, blood in stool, diarrhea, heartburn, nausea and vomiting.  Genitourinary: Negative for dysuria, frequency and hematuria.  Musculoskeletal: Negative for falls.  Skin: Negative for itching and rash.  Neurological: Negative for dizziness, focal weakness, seizures, loss of consciousness and headaches.  Psychiatric/Behavioral: Negative.     MEDICATIONS AT HOME:   Prior to Admission medications   Not on File      VITAL SIGNS:  Blood pressure 132/84, pulse 88, temperature 99 F (37.2 C), temperature source Oral, resp. rate 20, height 5'  11" (1.803 m), weight 111.4 kg, SpO2 95 %.  PHYSICAL EXAMINATION:  Physical Exam Constitutional:      General: He is not in acute distress.    Appearance: He is obese.  HENT:     Head: Normocephalic.     Right Ear: External ear normal.     Left Ear: External ear normal.     Nose: Nose normal. No congestion.     Mouth/Throat:     Mouth: Mucous membranes are  moist.     Pharynx: Oropharynx is clear.  Eyes:     Extraocular Movements: Extraocular movements intact.     Conjunctiva/sclera: Conjunctivae normal.     Pupils: Pupils are equal, round, and reactive to light.  Neck:     Musculoskeletal: Normal range of motion and neck supple.  Cardiovascular:     Rate and Rhythm: Normal rate and regular rhythm.     Pulses: Normal pulses.     Heart sounds: Normal heart sounds. No murmur. No friction rub. No gallop.   Pulmonary:     Effort: Pulmonary effort is normal.     Breath sounds: Normal breath sounds.  Abdominal:     General: Bowel sounds are normal. There is no distension.     Palpations: Abdomen is soft.     Tenderness: There is no abdominal tenderness.  Musculoskeletal: Normal range of motion.        General: Swelling (trace edema bilateral lower extremities with anterior erythema) and tenderness present. No signs of injury.     Right lower leg: Edema present.     Left lower leg: Edema present.  Skin:    General: Skin is warm and dry.     Capillary Refill: Capillary refill takes less than 2 seconds.     Coloration: Skin is not jaundiced.     Findings: Erythema (Anterior bilateral lower extremities below the knee) present.  Neurological:     General: No focal deficit present.     Mental Status: He is alert and oriented to person, place, and time.     Cranial Nerves: No cranial nerve deficit.     Sensory: No sensory deficit.     Motor: No weakness.  Psychiatric:        Mood and Affect: Mood normal.        Behavior: Behavior normal.       LABORATORY PANEL:   CBC Recent Labs  Lab 05/22/18 1945  WBC 31.7*  HGB 9.3*  HCT 29.9*  PLT 241   ------------------------------------------------------------------------------------------------------------------  Chemistries  Recent Labs  Lab 05/22/18 1945  NA 137  K 4.5  CL 103  CO2 25  GLUCOSE 244*  BUN 46*  CREATININE 2.26*  CALCIUM 8.1*  AST 18  ALT 12  ALKPHOS 53   BILITOT 1.0   ------------------------------------------------------------------------------------------------------------------  Cardiac Enzymes No results for input(s): TROPONINI in the last 168 hours. ------------------------------------------------------------------------------------------------------------------  RADIOLOGY:  US Venous Img Lower Bilateral  Result Date: 05/22/2018 CLINICAL DATA:  85 year old male with lower extremity edema and color changes with no known injury. EXAM: BILATERAL LOWER EXTREMITY VENOUS DOPPLER ULTRASOUND TECHNIQUE: Gray-scale sonography with graded compression, as well as color Doppler and duplex ultrasound were performed to evaluate the lower extremity deep venous systems from the level of the common femoral vein and including the common femoral, femoral, profunda femoral, popliteal and calf veins including the posterior tibial, peroneal and gastrocnemius veins when visible. The superficial great saphenous vein was also interrogated. Spectral Doppler was utilized to evaluate flow at  rest and with distal augmentation maneuvers in the common femoral, femoral and popliteal veins. COMPARISON:  None. FINDINGS: RIGHT LOWER EXTREMITY Common Femoral Vein: No evidence of thrombus. Normal compressibility, respiratory phasicity and response to augmentation. Saphenofemoral Junction: No evidence of thrombus. Normal compressibility and flow on color Doppler imaging. Profunda Femoral Vein: No evidence of thrombus. Normal compressibility and flow on color Doppler imaging. Femoral Vein: No evidence of thrombus. Normal compressibility, respiratory phasicity and response to augmentation. Popliteal Vein: No evidence of thrombus. Normal compressibility, respiratory phasicity and response to augmentation. Calf Veins: Visible are without evidence of thrombus. Normal compressibility and flow on color Doppler imaging. Other Findings:  None. LEFT LOWER EXTREMITY Common Femoral Vein: No evidence  of thrombus. Normal compressibility, respiratory phasicity and response to augmentation. Saphenofemoral Junction: No evidence of thrombus. Normal compressibility and flow on color Doppler imaging. Profunda Femoral Vein: No evidence of thrombus. Normal compressibility and flow on color Doppler imaging. Femoral Vein: No evidence of thrombus. Normal compressibility, respiratory phasicity and response to augmentation. Popliteal Vein: No evidence of thrombus. Normal compressibility, respiratory phasicity and response to augmentation. Calf Veins: Visible are without evidence of thrombus. Normal compressibility and flow on color Doppler imaging. Other Findings:  None. IMPRESSION: No evidence of deep venous thrombosis in either lower extremity. Electronically Signed   By: Genevie Ann M.D.   On: 05/22/2018 23:35   Dg Chest Port 1 View  Result Date: 05/22/2018 CLINICAL DATA:  Sepsis fever productive cough EXAM: PORTABLE CHEST 1 VIEW COMPARISON:  None. FINDINGS: Heart size upper limits of normal. No consolidation or effusion. Diffuse interstitial opacity. No pneumothorax. Minimal atelectasis right base. IMPRESSION: Mild diffuse increased interstitial opacity suggesting interstitial inflammatory process. Mild atelectasis at the right base Electronically Signed   By: Donavan Foil M.D.   On: 05/22/2018 20:05      IMPRESSION AND PLAN:   1.  Sepsis - Patient started on broad-spectrum IV antibiotic therapy with cefepime, vancomycin, and azithromycin for possible community-acquired pneumonia - Will repeat lactic acid per sepsis protocol - Repeat CBC in the a.m. - Patient received IV fluid bolus in the emergency room per protocol with continued normal saline infusing at 125 cc/h. --Blood, urine, and sputum cultures are pending  2.  Right lower extremity cellulitis - Broad-spectrum antibiotic coverage with cefepime and vancomycin - Bilateral lower extremity ultrasound negative for DVT - Repeat CBC in the a.m.  3.   Community-acquired pneumonia - With evidence of interstitial inflammatory process on chest x-ray, as well as leukocytosis and shortness of breath with productive cough - IV antibiotics as outlined above - We will repeat chest x-ray in the a.m. - CBC in the a.m. - DuoNeb every 6 hours - Incentive spirometry every hour while awake - Patient has O2 per nasal cannula at 2 L/min  4.  Diabetes mellitus - Moderate sliding scale insulin - Hemoglobin A1c pending  5.  Acute kidney injury - Likely secondary to sepsis with possible dehydration - Patient received fluid bolus on arrival with normal saline at 125 cc/h currently - We will repeat BMP and continue to monitor renal function closely - May need renal ultrasound if no improvement  6.  COPD - DuoNeb every 6 hours - Oxygen at 2 L/min per nasal cannula - No evidence of acute exacerbation     All the records are reviewed and case discussed with ED provider. The plan of care was discussed in details with the patient (and family). I answered all questions. The patient agreed to  proceed with the above mentioned plan. Further management will depend upon hospital course.   CODE STATUS: Full code  TOTAL TIME TAKING CARE OF THIS PATIENT:7minutes.    [place] on 05/23/2018 at 2:08 AM  Pager - [phone]  After 6pm go to [email or web address] - password [email or web address] [facility]  [facility] [place]  Office  [phone]  CC: Primary care physician; Baxter Hire, MD   Attending physician admission note:  I have seen and examined the patient with Ms. Gardiner Barefoot, CRNP on 05/22/2018.  The patient presents with fever, dyspnea and cough with worsening lower extremity edema.  Venous duplex of lower extremities came back negative.  He has significant leukocytosis.  Upon physical examination:  Generally: Pleasant elderly Caucasian male in mild respiratory distress with conversational dyspnea. Cardiovascular: Regular rate  and rhythm with normal S1-S2 and no murmurs gallops or rubs. Respiratory: Clear to auscultation bilaterally Abdomen: Soft, nontender, nondistended with positive bowel sounds and no palpable organomegaly or masses. Extremities: 1-2+ right lower extremity pitting edema with no clubbing or cyanosis. Skin: He has bilateral leg, right more than left, erythema with tenderness and warmth.  Labs and radiographic studies: were all reviewed  Assessment/plan: The patient will be admitted to the medical monitored bed for further management of sepsis likely secondary to lower extremity cellulitis and possibly community-acquired pneumonia that may be not showing yet on his chest x-ray due to mild dehydration.  He will be placed on IV vancomycin, cefepime and Zithromax.Marland Kitchen  His COVID-19 test came back negative.  For further details please refer to dictated admission H&P.  I have discussed the case with my nurse practitioner. I agree with the admission note and the rest of the plan of care as delineated by Ms. Gardiner Barefoot, CRNP.    Note: This dictation was prepared with Dragon dictation along with smaller phrase technology. Any transcriptional errors that result from this process are unintentional.

## 2018-05-24 ENCOUNTER — Inpatient Hospital Stay: Payer: Medicare HMO

## 2018-05-24 DIAGNOSIS — B954 Other streptococcus as the cause of diseases classified elsewhere: Secondary | ICD-10-CM

## 2018-05-24 DIAGNOSIS — B353 Tinea pedis: Secondary | ICD-10-CM

## 2018-05-24 LAB — BASIC METABOLIC PANEL
Anion gap: 6 (ref 5–15)
BUN: 57 mg/dL — ABNORMAL HIGH (ref 8–23)
CO2: 24 mmol/L (ref 22–32)
Calcium: 7.6 mg/dL — ABNORMAL LOW (ref 8.9–10.3)
Chloride: 105 mmol/L (ref 98–111)
Creatinine, Ser: 2.7 mg/dL — ABNORMAL HIGH (ref 0.61–1.24)
GFR calc Af Amer: 24 mL/min — ABNORMAL LOW (ref >60)
GFR calc non Af Amer: 21 mL/min — ABNORMAL LOW (ref >60)
Glucose, Bld: 212 mg/dL — ABNORMAL HIGH (ref 70–99)
Potassium: 4.3 mmol/L (ref 3.5–5.1)
Sodium: 135 mmol/L (ref 135–145)

## 2018-05-24 LAB — CBC
HCT: 24.2 % — ABNORMAL LOW (ref 39.0–52.0)
Hemoglobin: 7.5 g/dL — ABNORMAL LOW (ref 13.0–17.0)
MCH: 30.6 pg (ref 26.0–34.0)
MCHC: 31 g/dL (ref 30.0–36.0)
MCV: 98.8 fL (ref 80.0–100.0)
Platelets: 190 10*3/uL (ref 150–400)
RBC: 2.45 MIL/uL — ABNORMAL LOW (ref 4.22–5.81)
RDW: 18.5 % — ABNORMAL HIGH (ref 11.5–15.5)
WBC: 13.2 10*3/uL — ABNORMAL HIGH (ref 4.0–10.5)
nRBC: 0.2 % (ref 0.0–0.2)

## 2018-05-24 LAB — URINE CULTURE
Culture: NO GROWTH
Culture: NO GROWTH
Special Requests: NORMAL

## 2018-05-24 LAB — GLUCOSE, CAPILLARY
Glucose-Capillary: 211 mg/dL — ABNORMAL HIGH (ref 70–99)
Glucose-Capillary: 201 mg/dL — ABNORMAL HIGH (ref 70–99)
Glucose-Capillary: 280 mg/dL — ABNORMAL HIGH (ref 70–99)
Glucose-Capillary: 234 mg/dL — ABNORMAL HIGH (ref 70–99)

## 2018-05-24 MED ORDER — BISACODYL 5 MG PO TBEC: 5.0000 mg | DELAYED_RELEASE_TABLET | Freq: Every day | ORAL | Status: DC | PRN

## 2018-05-24 MED ORDER — GUAIFENESIN 100 MG/5ML PO SOLN
5.0000 mL | ORAL | Status: DC | PRN
  Filled 2018-05-24: qty 5

## 2018-05-24 NOTE — Progress Notes (Signed)
ID Pt says he is feeling a little better Leg seems to be lighter No fever Patient Vitals for the past 24 hrs:  BP Temp Temp src Pulse Resp SpO2  05/24/18 1627 (!) 116/47 97.9 F (36.6 C) Oral 69 18 96 %  05/24/18 1438 - - - - - (!) 89 %  05/24/18 1413 (!) 116/43 99.3 F (37.4 C) Oral 74 18 (!) 89 %  05/24/18 0735 - - - - - (!) 89 %  05/24/18 0516 (!) 122/49 98.2 F (36.8 C) Oral 76 (!) 22 93 %  05/23/18 2122 (!) 148/52 99.2 F (37.3 C) Oral 83 (!) 24 (!) 88 %  05/23/18 2051 - - - - - 95 %  O/E rt leg still is swollen, erythematous and erythema extending up the inner thigh        CBC Latest Ref Rng & Units 05/24/2018 05/23/2018 05/22/2018  WBC 4.0 - 10.5 K/uL 13.2(H) 23.9(H) 31.7(H)  Hemoglobin 13.0 - 17.0 g/dL 7.5(L) 8.0(L) 9.3(L)  Hematocrit 39.0 - 52.0 % 24.2(L) 25.5(L) 29.9(L)  Platelets 150 - 400 K/uL 190 205 241    Streptococcus Group G bacteremia due to cellulitis rt leg  On ceftriaxone and clindamycin- recommend continuing IV antibiotics for a few more days and then depending on the leg and if he has received atleast 5 days of Iv may think of transitioning to Po antibiotic  Fungal infection feet- on fluconazole  ID will follow remotely this weekend- call if needed

## 2018-05-24 NOTE — Progress Notes (Addendum)
[facility] [place] NAME: Arthur [name]    MR#:  [id number]  DATE OF BIRTH:  01-Jul-1933  SUBJECTIVE:  CHIEF COMPLAINT:   Chief Complaint  Patient presents with  . Fever   Better right leg redness, tenderness and swelling. g. REVIEW OF SYSTEMS:  Review of Systems  Constitutional: Negative for chills, fever and malaise/fatigue.  HENT: Negative for sore throat.   Eyes: Negative for blurred vision and double vision.  Respiratory: Negative for cough, hemoptysis, shortness of breath, wheezing and stridor.   Cardiovascular: Negative for chest pain, palpitations, orthopnea and leg swelling.  Gastrointestinal: Negative for abdominal pain, blood in stool, diarrhea, melena, nausea and vomiting.  Genitourinary: Negative for dysuria, flank pain and hematuria.  Musculoskeletal: Negative for back pain and joint pain.       Right leg redness, tenderness and swelling  Skin: Negative for rash.  Neurological: Negative for dizziness, sensory change, focal weakness, seizures, loss of consciousness, weakness and headaches.  Endo/Heme/Allergies: Negative for polydipsia.  Psychiatric/Behavioral: Negative for depression. The patient is not nervous/anxious.     DRUG ALLERGIES:   Allergies  Allergen Reactions  . Codeine Other (See Comments)    Constipation   VITALS:  Blood pressure (!) 122/49, pulse 76, temperature 98.2 F (36.8 C), temperature source Oral, resp. rate (!) 22, height 5\' 11"  (1.803 m), weight 114.1 kg, SpO2 (!) 89 %. PHYSICAL EXAMINATION:  Physical Exam Constitutional:      General: He is not in acute distress.    Appearance: Normal appearance.  HENT:     Head: Normocephalic.     Mouth/Throat:     Mouth: Mucous membranes are moist.  Eyes:     General: No scleral icterus.    Conjunctiva/sclera: Conjunctivae normal.     Pupils: Pupils are equal, round, and reactive to light.  Neck:     Musculoskeletal: Normal range of motion and  neck supple.     Vascular: No JVD.     Trachea: No tracheal deviation.  Cardiovascular:     Rate and Rhythm: Normal rate and regular rhythm.     Heart sounds: Normal heart sounds. No murmur. No gallop.   Pulmonary:     Effort: Pulmonary effort is normal. No respiratory distress.     Breath sounds: Normal breath sounds. No wheezing or rales.  Abdominal:     General: Bowel sounds are normal. There is no distension.     Palpations: Abdomen is soft.     Tenderness: There is no abdominal tenderness. There is no rebound.  Musculoskeletal: Normal range of motion.        General: No tenderness.     Right lower leg: No edema.     Left lower leg: No edema.     Comments: Right leg redness, tenderness and swelling  Skin:    Findings: No erythema or rash.  Neurological:     General: No focal deficit present.     Mental Status: He is alert and oriented to person, place, and time.     Cranial Nerves: No cranial nerve deficit.  Psychiatric:        Mood and Affect: Mood normal.    LABORATORY PANEL:  Male CBC Recent Labs  Lab 05/24/18 0808  WBC 13.2*  HGB 7.5*  HCT 24.2*  PLT 190   ------------------------------------------------------------------------------------------------------------------ Chemistries  Recent Labs  Lab 05/22/18 1945  05/24/18 0808  NA 137   < > 135  K 4.5   < >  4.3  CL 103   < > 105  CO2 25   < > 24  GLUCOSE 244*   < > 212*  BUN 46*   < > 57*  CREATININE 2.26*   < > 2.70*  CALCIUM 8.1*   < > 7.6*  AST 18  --   --   ALT 12  --   --   ALKPHOS 53  --   --   BILITOT 1.0  --   --    < > = values in this interval not displayed.   RADIOLOGY:  Dg Chest Port 1 View  Result Date: 05/24/2018 CLINICAL DATA:  85 year old male with a history of pneumonia EXAM: PORTABLE CHEST 1 VIEW COMPARISON:  05/22/2018 FINDINGS: Cardiomediastinal silhouette unchanged in size and contour. Coarsened interstitial markings bilaterally, with reticulonodular pattern. No pneumothorax.  No pleural effusion. No new confluent airspace disease. No significant interlobular septal thickening. Slight improved aeration compared to the prior IMPRESSION: Slight improvement in aeration, with persisting reticulonodular opacity which may reflect chronic changes and/or superimposed multifocal infection. Electronically Signed   By: Corrie Mckusick D.O.   On: 05/24/2018 08:28   ASSESSMENT AND PLAN:   1.  Sepsis due to leg cellulitis, possible pneumonia and strep bacteremia. - Patient was started on broad-spectrum IV antibiotic therapy with cefepime, vancomycin, and azithromycin for right leg cellulitis and community-acquired pneumonia He received IV fluid bolus in the emergency room per protocol with continued normal saline. Blood cultures show Streptococcus species.  Leukocytosis is improving. Pneumonia is ruled out.  Changed antibiotics as below. Changed to Rocephin 2 g IV. Add clindamycin.  B/l fungal infection feet- added fluconazole.  2.  Right lower extremity cellulitis Antibiotics as above. - Bilateral lower extremity ultrasound negative for DVT  3.  No Community-acquired pneumonia Prn DuoNeb.  Acute respiratory failure with hypoxia due to above.  Improved off oxygen.  4.  Diabetes mellitus - Moderate sliding scale insulin - Hemoglobin A1c 7.1.  5.  Acute kidney injury on CKD stage III. - Likely secondary to sepsis with possible dehydration Worsening, continue IV fluid support and follow-up BMP.  6.  COPD Stable, DuoNeb as needed. Anemia of chronic disease.  Follow-up hemoglobin.  Generalized weakness.  Ambulate patient and PT evaluation: HHPT.  All the records are reviewed and case discussed with Care Management/Social Worker. Management plans discussed with the patient, his daughter and they are in agreement.  CODE STATUS: DNR  TOTAL TIME TAKING CARE OF THIS PATIENT: 27 minutes.   More than 50% of the time was spent in counseling/coordination of care: YES   POSSIBLE D/C IN 2 DAYS, DEPENDING ON CLINICAL CONDITION.   Demetrios Loll M.D on 05/24/2018 at 1:07 PM  Between 7am to 6pm - Pager - [phone]  After 6pm go to [email or web address] - [place] [facility]

## 2018-05-24 NOTE — Care Management Important Message (Signed)
Important Message  Patient Details  Name: Arthur [name] MRN: [id number] Date of Birth: July 02, 1933   Medicare Important Message Given:  Yes    Dannette Barbara 05/24/2018, 11:29 AM

## 2018-05-24 NOTE — Progress Notes (Signed)
Pt. Placed on 2l [place]

## 2018-05-24 NOTE — Progress Notes (Addendum)
Inpatient Diabetes Program Recommendations  AACE/ADA: New Consensus Statement on Inpatient Glycemic Control (2015)  Target Ranges:  Prepandial:   less than 140 mg/dL      Peak postprandial:   less than 180 mg/dL (1-2 hours)      Critically ill patients:  140 - 180 mg/dL   Results for LARWENCE, TU (MRN [id number]) as of 05/24/2018 09:15  Ref. Range 05/23/2018 07:47 05/23/2018 11:42 05/23/2018 16:24 05/23/2018 21:19  Glucose-Capillary Latest Ref Range: 70 - 99 mg/dL 178 (H)  3 units NOVOLOG  210 (H)  5 units NOVOLOG  248 (H)  5 units NOVOLOG  253 (H)  3 units NOVOLOG    Results for FINDLEY, VI (MRN [id number]) as of 05/24/2018 09:15  Ref. Range 05/24/2018 08:00  Glucose-Capillary Latest Ref Range: 70 - 99 mg/dL 211 (H)   Results for KAIGE, WHISTLER (MRN [id number]) as of 05/24/2018 09:15  Ref. Range 05/23/2018 04:01  Hemoglobin A1C Latest Ref Range: 4.8 - 5.6 % 7.1 (H)    Admit with: Sepsis/ RLE Cellulitis/ Pneumonia  History: DM  Home DM Meds: Levemir 45 units QPM       Novolog 22 units with Breakfast/ 20 units with Lunch/ 25 units with Dinner  Current Orders: Novolog Moderate Correction Scale/ SSI (0-15 units) TID AC + HS    MD- Please consider the following in-hospital insulin adjustments:  1. Start Levemir 12 units Daily (25% total home dose to start)   2. Start Novolog Meal Coverage: Novolog 6 units TID with meals (25% total home dose)  (Please add the following Hold Parameters: Hold if pt eats <50% of meal, Hold if pt NPO)      --Will follow patient during hospitalization--  Wyn Quaker RN, MSN, CDE Diabetes Coordinator Inpatient Glycemic Control Team Team Pager: [phone] (8a-5p)

## 2018-05-25 LAB — CBC
HCT: 25.3 % — ABNORMAL LOW (ref 39.0–52.0)
Hemoglobin: 7.8 g/dL — ABNORMAL LOW (ref 13.0–17.0)
MCH: 30.4 pg (ref 26.0–34.0)
MCHC: 30.8 g/dL (ref 30.0–36.0)
MCV: 98.4 fL (ref 80.0–100.0)
Platelets: 207 10*3/uL (ref 150–400)
RBC: 2.57 MIL/uL — ABNORMAL LOW (ref 4.22–5.81)
RDW: 18.4 % — ABNORMAL HIGH (ref 11.5–15.5)
WBC: 11.2 10*3/uL — ABNORMAL HIGH (ref 4.0–10.5)
nRBC: 0.4 % — ABNORMAL HIGH (ref 0.0–0.2)

## 2018-05-25 LAB — CULTURE, BLOOD (ROUTINE X 2)

## 2018-05-25 LAB — BASIC METABOLIC PANEL
Anion gap: 7 (ref 5–15)
BUN: 59 mg/dL — ABNORMAL HIGH (ref 8–23)
CO2: 25 mmol/L (ref 22–32)
Calcium: 7.8 mg/dL — ABNORMAL LOW (ref 8.9–10.3)
Chloride: 103 mmol/L (ref 98–111)
Creatinine, Ser: 2.77 mg/dL — ABNORMAL HIGH (ref 0.61–1.24)
GFR calc Af Amer: 24 mL/min — ABNORMAL LOW (ref >60)
GFR calc non Af Amer: 20 mL/min — ABNORMAL LOW (ref >60)
Glucose, Bld: 213 mg/dL — ABNORMAL HIGH (ref 70–99)
Potassium: 4.3 mmol/L (ref 3.5–5.1)
Sodium: 135 mmol/L (ref 135–145)

## 2018-05-25 LAB — GLUCOSE, CAPILLARY
Glucose-Capillary: 201 mg/dL — ABNORMAL HIGH (ref 70–99)
Glucose-Capillary: 212 mg/dL — ABNORMAL HIGH (ref 70–99)
Glucose-Capillary: 239 mg/dL — ABNORMAL HIGH (ref 70–99)
Glucose-Capillary: 181 mg/dL — ABNORMAL HIGH (ref 70–99)

## 2018-05-25 LAB — CULTURE, RESPIRATORY W GRAM STAIN
Culture: NORMAL
Gram Stain: NONE SEEN
Special Requests: NORMAL

## 2018-05-25 MED ORDER — ORAL CARE MOUTH RINSE
15.0000 mL | Freq: Two times a day (BID) | OROMUCOSAL | Status: DC
  Administered 2018-05-25 – 2018-05-28 (×5): 15 mL via OROMUCOSAL

## 2018-05-25 MED ORDER — INSULIN ASPART 100 UNIT/ML ~~LOC~~ SOLN
6.0000 [IU] | Freq: Three times a day (TID) | SUBCUTANEOUS | Status: DC
  Administered 2018-05-25 – 2018-05-28 (×11): 6 [IU] via SUBCUTANEOUS
  Filled 2018-05-25 (×11): qty 1

## 2018-05-25 MED ORDER — ALUM & MAG HYDROXIDE-SIMETH 200-200-20 MG/5ML PO SUSP: 30.0000 mL | Freq: Four times a day (QID) | ORAL | Status: DC | PRN

## 2018-05-25 MED ORDER — INSULIN DETEMIR 100 UNIT/ML ~~LOC~~ SOLN
12.0000 [IU] | Freq: Every day | SUBCUTANEOUS | Status: DC
  Filled 2018-05-25: qty 0.12

## 2018-05-25 MED ORDER — GUAIFENESIN-DM 100-10 MG/5ML PO SYRP: 5.0000 mL | ORAL_SOLUTION | ORAL | Status: DC | PRN

## 2018-05-25 MED ORDER — INSULIN DETEMIR 100 UNIT/ML ~~LOC~~ SOLN
12.0000 [IU] | Freq: Every day | SUBCUTANEOUS | Status: DC
  Administered 2018-05-25 – 2018-05-27 (×2): 12 [IU] via SUBCUTANEOUS
  Filled 2018-05-25 (×4): qty 0.12

## 2018-05-25 MED ORDER — PANTOPRAZOLE SODIUM 40 MG PO TBEC
40.0000 mg | DELAYED_RELEASE_TABLET | Freq: Every day | ORAL | Status: DC
  Administered 2018-05-25 – 2018-05-28 (×4): 40 mg via ORAL
  Filled 2018-05-25 (×4): qty 1

## 2018-05-25 MED ORDER — GUAIFENESIN ER 600 MG PO TB12
600.0000 mg | ORAL_TABLET | Freq: Two times a day (BID) | ORAL | Status: DC
  Administered 2018-05-25 – 2018-05-28 (×7): 600 mg via ORAL
  Filled 2018-05-25 (×7): qty 1

## 2018-05-25 MED ORDER — SODIUM CHLORIDE 0.9 % IV SOLN
INTRAVENOUS | Status: DC
  Administered 2018-05-25 – 2018-05-26 (×2): via INTRAVENOUS

## 2018-05-25 NOTE — Plan of Care (Signed)
Patient has been coughing up quite a bit of phlegm today which has been thick.  This has made him feel nauseated.  Zofran x1.

## 2018-05-25 NOTE — Progress Notes (Signed)
[place] at [place] NAME: Arthur [name]    MR#:  [id number]  DATE OF BIRTH:  August 06, 1933  SUBJECTIVE:  CHIEF COMPLAINT:   Chief Complaint  Patient presents with  . Fever   The patient has a lot of cough and wheezing this morning.  He vomited this morning.  Better leg swelling, redness and tenderness. REVIEW OF SYSTEMS:  Review of Systems  Constitutional: Negative for chills, fever and malaise/fatigue.  HENT: Negative for sore throat.   Eyes: Negative for blurred vision and double vision.  Respiratory: Negative for cough, hemoptysis, shortness of breath, wheezing and stridor.   Cardiovascular: Negative for chest pain, palpitations, orthopnea and leg swelling.  Gastrointestinal: Negative for abdominal pain, blood in stool, diarrhea, melena, nausea and vomiting.  Genitourinary: Negative for dysuria, flank pain and hematuria.  Musculoskeletal: Negative for back pain and joint pain.       Right leg redness, tenderness and swelling  Skin: Negative for rash.  Neurological: Negative for dizziness, sensory change, focal weakness, seizures, loss of consciousness, weakness and headaches.  Endo/Heme/Allergies: Negative for polydipsia.  Psychiatric/Behavioral: Negative for depression. The patient is not nervous/anxious.     DRUG ALLERGIES:   Allergies  Allergen Reactions  . Codeine Other (See Comments)    Constipation   VITALS:  Blood pressure 114/61, pulse 75, temperature 98.3 F (36.8 C), temperature source Oral, resp. rate 17, height 5\' 11"  (1.803 m), weight 114.1 kg, SpO2 98 %. PHYSICAL EXAMINATION:  Physical Exam Constitutional:      General: He is not in acute distress.    Appearance: Normal appearance.  HENT:     Head: Normocephalic.     Mouth/Throat:     Mouth: Mucous membranes are moist.  Eyes:     General: No scleral icterus.    Conjunctiva/sclera: Conjunctivae normal.     Pupils: Pupils are equal, round, and reactive to  light.  Neck:     Musculoskeletal: Normal range of motion and neck supple.     Vascular: No JVD.     Trachea: No tracheal deviation.  Cardiovascular:     Rate and Rhythm: Normal rate and regular rhythm.     Heart sounds: Normal heart sounds. No murmur. No gallop.   Pulmonary:     Effort: Pulmonary effort is normal. No respiratory distress.     Breath sounds: No stridor. Wheezing and rhonchi present. No rales.  Abdominal:     General: Bowel sounds are normal. There is no distension.     Palpations: Abdomen is soft.     Tenderness: There is no abdominal tenderness. There is no rebound.  Musculoskeletal: Normal range of motion.        General: Swelling present. No tenderness.     Right lower leg: No edema.     Left lower leg: No edema.     Comments: Better right leg redness, tenderness and swelling  Skin:    Findings: No erythema or rash.  Neurological:     General: No focal deficit present.     Mental Status: He is alert and oriented to person, place, and time.     Cranial Nerves: No cranial nerve deficit.  Psychiatric:        Mood and Affect: Mood normal.    LABORATORY PANEL:  Male CBC Recent Labs  Lab 05/25/18 0553  WBC 11.2*  HGB 7.8*  HCT 25.3*  PLT 207   ------------------------------------------------------------------------------------------------------------------ Chemistries  Recent Labs  Lab 05/22/18 1945  05/25/18 0553  NA 137   < > 135  K 4.5   < > 4.3  CL 103   < > 103  CO2 25   < > 25  GLUCOSE 244*   < > 213*  BUN 46*   < > 59*  CREATININE 2.26*   < > 2.77*  CALCIUM 8.1*   < > 7.8*  AST 18  --   --   ALT 12  --   --   ALKPHOS 53  --   --   BILITOT 1.0  --   --    < > = values in this interval not displayed.   RADIOLOGY:  No results found. ASSESSMENT AND PLAN:   1.  Sepsis due to leg cellulitis, pneumonia and strep bacteremia. - Patient was started on broad-spectrum IV antibiotic therapy with cefepime, vancomycin, and azithromycin for right  leg cellulitis and community-acquired pneumonia He received IV fluid bolus in the emergency room per protocol with continued normal saline. Blood cultures show Streptococcus species.  Leukocytosis is improving. Pneumonia is ruled out.  Changed antibiotics as below. Changed to Rocephin 2 g IV. Added clindamycin.  B/l fungal infection feet- added fluconazole. 5 days IV antibiotics as above then transition to p.o. per Dr. Verita Lamb.  2.  Right lower extremity cellulitis Antibiotics as above. - Bilateral lower extremity ultrasound negative for DVT  3.  Community-acquired pneumonia DuoNeb as needed, Robitussin as needed, Mucinex.  Acute respiratory failure with hypoxia due to above.  Improved off oxygen.  4.  Diabetes mellitus - Moderate sliding scale insulin - Hemoglobin A1c 7.1.  5.  Acute kidney injury on CKD stage III. - Likely secondary to sepsis with possible dehydration Worsening, continue IV fluid support and follow-up BMP.  6.  COPD Stable, DuoNeb as needed. Anemia of chronic disease.  Follow-up hemoglobin.  Generalized weakness.  Ambulate patient and PT evaluation: HHPT.  All the records are reviewed and case discussed with Care Management/Social Worker. Management plans discussed with the patient, his daughter and they are in agreement.  CODE STATUS: DNR  TOTAL TIME TAKING CARE OF THIS PATIENT: 32 minutes.   More than 50% of the time was spent in counseling/coordination of care: YES  POSSIBLE D/C IN 2-3 DAYS, DEPENDING ON CLINICAL CONDITION.   Demetrios Loll M.D on 05/25/2018 at 1:26 PM  Between 7am to 6pm - Pager - [phone]  After 6pm go to [email or web address] - [facility]

## 2018-05-25 NOTE — Evaluation (Signed)
Clinical/Bedside Swallow Evaluation Patient Details  Name: Arthur [name] MRN: [id number] Date of Birth: [birth year]  Today's Date: 05/25/2018 Time: SLP Start Time (ACUTE ONLY): 1203 SLP Stop Time (ACUTE ONLY): 1255 SLP Time Calculation (min) (ACUTE ONLY): 52 min  Past Medical History:  Past Medical History:  Diagnosis Date  . COPD (chronic obstructive pulmonary disease) ([place])   . Diabetes mellitus without complication ([place])   . Hyperlipidemia   . Hypertension    Past Surgical History: History reviewed. No pertinent surgical history. HPI:  Pt is a 85 y.o. male with a known history of COPD, hypertension, obesity, diabetes mellitus, hyperlipidemia.  He presented to the emergency room complaining of fever with increased shortness of breath and edema of bilateral lower extremities.  He reports a subjective fever at home.  Temperature was 2.3 on arrival to the emergency room.  He has also noted mild lower extremity edema present for 2 to 3 weeks with increasing anterior erythema below the knee.  He denies having been treated for cellulitis of his lower extremities recently.  He denies a history of pulmonary embolism or DVT.  Patient also endorses a productive cough present for about 3 weeks.  He has a history of COPD; not on home oxygen therapy.  After further assessment at BSE, pt endorses frequent use of TUMS and Rolaids indicating mid-sternum discomfort and pressure - he stated he has had Regurgitation of phlegm and worsening discomfort in the past ~3+ days.  He denies any swallowing problems w/ liquids but stated "some" foods are "harder to get down".  He denies seeing a GI or being on a PPI in the past.    Assessment / Plan / Recommendation Clinical Impression  Pt appears to present w/ adequate oropharyngeal phase swallowing function w/ no overt s/s of aspiration noted during po trials at this evaluation. Pt does endorse frequent s/s of REFLUX and takes OTC medications at home - he stated the  regurgitation of phlegm has been ongoing for ~3 days. Pt was positioned min more upright and given trials of thin liquids, purees, and soft solids; no overt s/s of aspiration noted - no decline in vocal quality or respiratory status during/post trials. Oral phase appeared [facility] for bolus management, A-P transfer, and oral clearing w/ all trial consistencies. Pt was not wearing his Dentures for the soft solid trials but stated he could manage d/t the soft consistency; he does wear his Dentures when eating often. Food pieces were cut small and moistened for best management. OM exam appeared [facility]. Pt fed himself. He denied any GI/Esophageal discomfort during the po's consumed - no dry cough appreciated. NSG reported No difficulty swallowing pills w/ water this morning, nor w/ breakfast meals though pt did not eat much of it per his report. He appears to present w/ declined interest in foods on the Menu and could not indicate preferences to SLP.  Recommend a regular-mech soft consistency diet w/ Thin liquids. Moistened foods for easier Esophageal phase motility. General aspiration precautions - REFLUX precautions. Pills w/ water as tolerates, or w/ a puree. Tray setup at meals as needed. Pt appears at his baseline w/ regard to oropharyngeal swallowing; no further skilled ST services indicated currently. NSG to reconsult if any further needs while admitted. REcommend a GI consult for assessment/management of any potential Esophageal dysmotility/REflux as any regurgitated material can increase risk for aspiration of such thus Pulmonary decline. MD/NSG updated.  SLP Visit Diagnosis: Dysphagia, unspecified (R13.10)(Esophageal phase dysmotility)    Aspiration Risk  (  reduced from an oropharyngeal phase standpoint)    Diet Recommendation  Regular-mech soft consistency foods/diet; Thin liquids. General aspiration precautions; REFLUX precautions.  Medication Administration: Whole meds with liquid(as tolerates; consider in  puree if needed)    Other  Recommendations Recommended Consults: Consider GI evaluation;Consider esophageal assessment(PPI; Dietician f/u) Oral Care Recommendations: Oral care BID;Patient independent with oral care(Dentures) Other Recommendations: (n/a)   Follow up Recommendations None      Frequency and Duration (n/a)  (n/a)       Prognosis Prognosis for Safe Diet Advancement: Fair(-Good) Barriers to Reach Goals: Time post onset;Severity of deficits(s/s of Reflux)      Swallow Study   General Date of Onset: 05/22/18 HPI: Pt is a 85 y.o. male with a known history of COPD, hypertension, obesity, diabetes mellitus, hyperlipidemia.  He presented to the emergency room complaining of fever with increased shortness of breath and edema of bilateral lower extremities.  He reports a subjective fever at home.  Temperature was 2.3 on arrival to the emergency room.  He has also noted mild lower extremity edema present for 2 to 3 weeks with increasing anterior erythema below the knee.  He denies having been treated for cellulitis of his lower extremities recently.  He denies a history of pulmonary embolism or DVT.  Patient also endorses a productive cough present for about 3 weeks.  He has a history of COPD; not on home oxygen therapy.  After further assessment at BSE, pt endorses frequent use of TUMS and Rolaids indicating mid-sternum discomfort and pressure - he stated he has had Regurgitation of phlegm and worsening discomfort in the past ~3+ days.  He denies any swallowing problems w/ liquids but stated "some" foods are "harder to get down".  He denies seeing a GI or being on a PPI in the past.  Type of Study: Bedside Swallow Evaluation Previous Swallow Assessment: none reported Diet Prior to this Study: Regular;Thin liquids Temperature Spikes Noted: No(wbc 11.2) Respiratory Status: Nasal cannula(2 liters) History of Recent Intubation: No Behavior/Cognition: Alert;Cooperative;Pleasant mood Oral  Cavity Assessment: Within Functional Limits Oral Care Completed by SLP: Recent completion by staff Oral Cavity - Dentition: Edentulous(has dentures in room) Vision: Functional for self-feeding Self-Feeding Abilities: Able to feed self;Needs set up Patient Positioning: Upright in bed(needed min positioning) Baseline Vocal Quality: Normal Volitional Cough: Strong Volitional Swallow: Able to elicit    Oral/Motor/Sensory Function Overall Oral Motor/Sensory Function: Within functional limits   Ice Chips Ice chips: Not tested   Thin Liquid Thin Liquid: Within functional limits Presentation: Cup;Self Fed;Straw(~4 ozs )    Nectar Thick Nectar Thick Liquid: Not tested   Honey Thick Honey Thick Liquid: Not tested   Puree Puree: Within functional limits Presentation: Spoon;Self Fed(5+ trials)   Solid     Solid: Impaired Presentation: Spoon;Self Fed(2 trials) Oral Phase Impairments: Impaired mastication(edentulous) Oral Phase Functional Implications: (edentulous) Pharyngeal Phase Impairments: (none) Other Comments: uses dentures when eating sometimes       Orinda Kenner, MS, CCC-SLP Nitya Cauthon 05/25/2018,1:41 PM

## 2018-05-26 LAB — BASIC METABOLIC PANEL
Anion gap: 6 (ref 5–15)
BUN: 56 mg/dL — ABNORMAL HIGH (ref 8–23)
CO2: 26 mmol/L (ref 22–32)
Calcium: 7.7 mg/dL — ABNORMAL LOW (ref 8.9–10.3)
Chloride: 103 mmol/L (ref 98–111)
Creatinine, Ser: 2.42 mg/dL — ABNORMAL HIGH (ref 0.61–1.24)
GFR calc Af Amer: 28 mL/min — ABNORMAL LOW (ref >60)
GFR calc non Af Amer: 24 mL/min — ABNORMAL LOW (ref >60)
Glucose, Bld: 178 mg/dL — ABNORMAL HIGH (ref 70–99)
Potassium: 4.2 mmol/L (ref 3.5–5.1)
Sodium: 135 mmol/L (ref 135–145)

## 2018-05-26 LAB — GLUCOSE, CAPILLARY
Glucose-Capillary: 147 mg/dL — ABNORMAL HIGH (ref 70–99)
Glucose-Capillary: 167 mg/dL — ABNORMAL HIGH (ref 70–99)
Glucose-Capillary: 180 mg/dL — ABNORMAL HIGH (ref 70–99)
Glucose-Capillary: 104 mg/dL — ABNORMAL HIGH (ref 70–99)

## 2018-05-26 MED ORDER — ENOXAPARIN SODIUM 40 MG/0.4ML ~~LOC~~ SOLN
40.0000 mg | SUBCUTANEOUS | Status: DC
  Administered 2018-05-27 – 2018-05-28 (×2): 40 mg via SUBCUTANEOUS
  Filled 2018-05-26 (×2): qty 0.4

## 2018-05-26 MED ORDER — ENSURE MAX PROTEIN PO LIQD
11.0000 [oz_av] | Freq: Two times a day (BID) | ORAL | Status: DC
  Administered 2018-05-26 – 2018-05-28 (×4): 11 [oz_av] via ORAL
  Filled 2018-05-26: qty 330

## 2018-05-26 MED ORDER — ENOXAPARIN SODIUM 40 MG/0.4ML ~~LOC~~ SOLN: 40.0000 mg | SUBCUTANEOUS | Status: DC

## 2018-05-26 NOTE — Progress Notes (Signed)
[place] at [place] NAME: Arthur [name]    MR#:  [id number]  DATE OF BIRTH:  14-Sep-1933  SUBJECTIVE:  CHIEF COMPLAINT:   Chief Complaint  Patient presents with  . Fever   The patient has better cough and sputum this morning. REVIEW OF SYSTEMS:  Review of Systems  Constitutional: Negative for chills, fever and malaise/fatigue.  HENT: Negative for sore throat.   Eyes: Negative for blurred vision and double vision.  Respiratory: Negative for cough, hemoptysis, shortness of breath, wheezing and stridor.   Cardiovascular: Negative for chest pain, palpitations, orthopnea and leg swelling.  Gastrointestinal: Negative for abdominal pain, blood in stool, diarrhea, melena, nausea and vomiting.  Genitourinary: Negative for dysuria, flank pain and hematuria.  Musculoskeletal: Negative for back pain and joint pain.       Right leg redness, tenderness and swelling  Skin: Negative for rash.  Neurological: Negative for dizziness, sensory change, focal weakness, seizures, loss of consciousness, weakness and headaches.  Endo/Heme/Allergies: Negative for polydipsia.  Psychiatric/Behavioral: Negative for depression. The patient is not nervous/anxious.     DRUG ALLERGIES:   Allergies  Allergen Reactions  . Codeine Other (See Comments)    Constipation   VITALS:  Blood pressure (!) 139/59, pulse 69, temperature 97.9 F (36.6 C), temperature source Oral, resp. rate 16, height 5\' 11"  (1.803 m), weight 114.1 kg, SpO2 94 %. PHYSICAL EXAMINATION:  Physical Exam Constitutional:      General: He is not in acute distress.    Appearance: Normal appearance.  HENT:     Head: Normocephalic.     Mouth/Throat:     Mouth: Mucous membranes are moist.  Eyes:     General: No scleral icterus.    Conjunctiva/sclera: Conjunctivae normal.     Pupils: Pupils are equal, round, and reactive to light.  Neck:     Musculoskeletal: Normal range of motion and neck  supple.     Vascular: No JVD.     Trachea: No tracheal deviation.  Cardiovascular:     Rate and Rhythm: Normal rate and regular rhythm.     Heart sounds: Normal heart sounds. No murmur. No gallop.   Pulmonary:     Effort: Pulmonary effort is normal. No respiratory distress.     Breath sounds: No stridor. Wheezing and rhonchi present. No rales.  Abdominal:     General: Bowel sounds are normal. There is no distension.     Palpations: Abdomen is soft.     Tenderness: There is no abdominal tenderness. There is no rebound.  Musculoskeletal: Normal range of motion.        General: Swelling present. No tenderness.     Right lower leg: No edema.     Left lower leg: No edema.     Comments: Better right leg redness, tenderness and swelling  Skin:    Findings: No erythema or rash.  Neurological:     General: No focal deficit present.     Mental Status: He is alert and oriented to person, place, and time.     Cranial Nerves: No cranial nerve deficit.  Psychiatric:        Mood and Affect: Mood normal.    LABORATORY PANEL:  Male CBC Recent Labs  Lab 05/25/18 0553  WBC 11.2*  HGB 7.8*  HCT 25.3*  PLT 207   ------------------------------------------------------------------------------------------------------------------ Chemistries  Recent Labs  Lab 05/22/18 1945  05/26/18 0414  NA 137   < > 135  K  4.5   < > 4.2  CL 103   < > 103  CO2 25   < > 26  GLUCOSE 244*   < > 178*  BUN 46*   < > 56*  CREATININE 2.26*   < > 2.42*  CALCIUM 8.1*   < > 7.7*  AST 18  --   --   ALT 12  --   --   ALKPHOS 53  --   --   BILITOT 1.0  --   --    < > = values in this interval not displayed.   RADIOLOGY:  No results found. ASSESSMENT AND PLAN:   1.  Sepsis due to leg cellulitis, pneumonia and strep bacteremia. - Patient was started on broad-spectrum IV antibiotic therapy with cefepime, vancomycin, and azithromycin for right leg cellulitis and community-acquired pneumonia He received IV  fluid bolus in the emergency room per protocol with continued normal saline. Blood cultures show Streptococcus species.  Leukocytosis is improving. Pneumonia is ruled out.  Changed antibiotics as below. Changed to Rocephin 2 g IV. Added clindamycin.  B/l fungal infection feet- added fluconazole. 5 days IV antibiotics as above then transition to p.o. per Dr. Verita Lamb.  2.  Right lower extremity cellulitis Antibiotics as above. - Bilateral lower extremity ultrasound negative for DVT  3.  Community-acquired pneumonia Continue DuoNeb as needed, Robitussin as needed, Mucinex.  Acute respiratory failure with hypoxia due to above.   The patient is back on oxygen by nasal cannula, try to wean off.  Treatment as above.  4.  Diabetes mellitus - Moderate sliding scale insulin - Hemoglobin A1c 7.1.  5.  Acute kidney injury on CKD stage III. - Likely secondary to sepsis with possible dehydration Improving with IV fluid support and follow-up BMP.  6.  COPD Stable, DuoNeb as needed. Anemia of chronic disease.  Follow-up hemoglobin.  Generalized weakness.  Ambulate patient and PT evaluation: HHPT.  All the records are reviewed and case discussed with Care Management/Social Worker. Management plans discussed with the patient, his daughter and they are in agreement.  CODE STATUS: DNR  TOTAL TIME TAKING CARE OF THIS PATIENT: 32 minutes.   More than 50% of the time was spent in counseling/coordination of care: YES  POSSIBLE D/C IN 2 DAYS, DEPENDING ON CLINICAL CONDITION.   Demetrios Loll M.D on 05/26/2018 at 1:05 PM  Between 7am to 6pm - Pager - [phone]  After 6pm go to [email or web address] - [facility]

## 2018-05-26 NOTE — Progress Notes (Signed)
PHARMACIST - PHYSICIAN COMMUNICATION  CONCERNING:  Enoxaparin (Lovenox) for DVT Prophylaxis   RECOMMENDATION: Patient was prescribed enoxaprin 30mg  q24 hours for VTE prophylaxis.   Filed Weights   05/22/18 1943 05/23/18 0002 05/23/18 1933  Weight: 260 lb (117.9 kg) 245 lb 9.5 oz (111.4 kg) 251 lb 8.7 oz (114.1 kg)    Body mass index is 35.08 kg/m.  Estimated Creatinine Clearance: 30.2 mL/min (A) (by C-G formula based on SCr of 2.42 mg/dL (H)).  Patient is candidate for enoxaparin 40mg  every 24 hours based on CrCl >75mL/min.  DESCRIPTION: Pharmacy has adjusted enoxaparin dose per [facility] policy.  Patient is now receiving enoxaparin 40mg  every 24 hours.   Joelyn Lover L, RPh 05/26/2018 2:12 PM

## 2018-05-26 NOTE — TOC Initial Note (Signed)
Transition of Care [facility]) - Initial/Assessment Note    Patient Details  Name: FREDERIC TONES MRN: [id number] Date of Birth: Jul 04, 1933  Transition of Care [facility]) CM/SW Contact:    Latanya Maudlin, RN Phone Number: 05/26/2018, 11:14 AM  Clinical Narrative:  [facility] team met with patient following PT recommendation of home health. Patient lives alone, uses a cane and walker as needed. Still drives "rarely" but is able to get reliable transport from his daughter. CMS Medicare.gov Compare Post Acute Care list reviewed with patient and he has no preference of agency, referral made with Corene Cornea from [place] care. PCP is Edwina Barth. Uses [facility] pharmacy without issues. Will continue to follow for [facility] orders and notify advanced of discharge date.                  Expected Discharge Plan: [place] Barriers to Discharge: Continued Medical Work up   Patient Goals and CMS Choice Patient states their goals for this hospitalization and ongoing recovery are:: to get my breathing better and go home CMS Medicare.gov Compare Post Acute Care list provided to:: Patient Choice offered to / list presented to : Patient  Expected Discharge Plan and Services Expected Discharge Plan: [place]   Discharge Planning Services: CM Consult Post Acute Care Choice: [place] arrangements for the past 2 months: [place]: PT [place]: [place] ([place]) Date HH Agency Contacted: 05/26/18 Time HH Agency Contacted: 1113    Prior Living Arrangements/Services Living arrangements for the past 2 months: [place] with:: Self Patient language and need for interpreter reviewed:: Yes Do you feel safe going back to the place where you live?: Yes          Current home services: DME    Activities of Daily Living Home Assistive Devices/Equipment: None ADL Screening (condition at time of  admission) Patient's cognitive ability adequate to safely complete daily activities?: Yes Is the patient deaf or have difficulty hearing?: No Does the patient have difficulty seeing, even when wearing glasses/contacts?: No Does the patient have difficulty concentrating, remembering, or making decisions?: No Patient able to express need for assistance with ADLs?: Yes Does the patient have difficulty dressing or bathing?: No Independently performs ADLs?: Yes (appropriate for developmental age) Does the patient have difficulty walking or climbing stairs?: No Weakness of Legs: Both Weakness of Arms/Hands: None  Permission Sought/Granted                  Emotional Assessment Appearance:: Appears stated age Attitude/Demeanor/Rapport: Engaged, Ambitious Affect (typically observed): Accepting Orientation: : Oriented to Self, Oriented to Place, Oriented to  Time, Oriented to Situation      Admission diagnosis:  Cellulitis of right lower extremity [L03.115] Sepsis ([place]) [A41.9] Sepsis, due to unspecified organism, unspecified whether acute organ dysfunction present [place]) [A41.9] Patient Active Problem List   Diagnosis Date Noted  . Sepsis ([place]) 05/23/2018  . Cellulitis of right lower extremity 05/22/2018  . DIABETES MELLITUS 10/24/2006  . OBESITY 10/24/2006  . HYPERTENSION 10/24/2006  . ALLERGIC RHINITIS 10/24/2006  . CHRONIC OBSTRUCTIVE PULMONARY DISEASE, MODERATE 10/24/2006  . TOBACCO ABUSE, HX OF 10/24/2006   PCP:  Baxter Hire, MD Pharmacy:   [facility] [place]  [place] [address] Phone: [phone] Fax: [phone]     Social Determinants of Health (SDOH) Interventions    Readmission Risk Interventions Readmission Risk Prevention Plan 05/26/2018  Transportation Screening Complete  Home Care Screening Complete  Medication Review (RN CM) Complete  Some recent data might be hidden

## 2018-05-26 NOTE — Progress Notes (Signed)
Initial Nutrition Assessment  RD working remotely.  DOCUMENTATION CODES:   Obesity unspecified  INTERVENTION:  Provide Ensure Max Protein po BID, each supplement provides 150 kcal and 30 grams of protein.  Provide daily MVI.  NUTRITION DIAGNOSIS:   Increased nutrient needs related to catabolic illness(COPD, cellulitis) as evidenced by estimated needs.  GOAL:   Patient will meet greater than or equal to 90% of their needs  MONITOR:   PO intake, Supplement acceptance, Labs, Weight trends, Skin, I & O's  REASON FOR ASSESSMENT:   Consult Assessment of nutrition requirement/status  ASSESSMENT:   85 year old male with PMHx of DM, HLD, HTN, COPD admitted with sepsis due to leg cellulitis, PNA, strep bacteremia.   Spoke with patient over the phone. He had difficulty hearing RD so it was difficult to get a good history. He reports he typically has a good appetite and intake at baseline but has not been eating well here because he does not like the food. PO intake in chart is variable between 0-100% with an average of 57% meal completion. He reports he did like the pimento cheese sandwich he had for lunch today. He is amenable to drinking ONS to help meet calorie/protein needs.  Patient reports he is weight-stable. He is currently 114.1 kg (251.55 lbs).  Medications reviewed and include: Diflucan, folic acid 1 mg daily, Novolog 0-15 units TID, Novolog 0-5 units QHS, Novolog 6 units TID, Levemir 12 units daily, MVI daily, pantoprazole, thiamine 100 mg daily, ceftriaxone, clindamycin.  Labs reviewed: CBG 147-239, BUN 46, Creatinine 2.42.  NUTRITION - FOCUSED PHYSICAL EXAM:  Unable to complete at this time.  Diet Order:   Diet Order            Diet Carb Modified Fluid consistency: Thin; Room service appropriate? Yes with Assist  Diet effective now             EDUCATION NEEDS:   No education needs have been identified at this time  Skin:  Skin Assessment: Skin Integrity  Issues:(cellulitis b/l legs; MSAD to groin)  Last BM:  05/26/2018 - small type 6  Height:   Ht Readings from Last 1 Encounters:  05/23/18 5\' 11"  (1.803 m)   Weight:   Wt Readings from Last 1 Encounters:  05/23/18 114.1 kg   Ideal Body Weight:  78.2 kg  BMI:  Body mass index is 35.08 kg/m.  Estimated Nutritional Needs:   Kcal:  2200-2400  Protein:  110-120 grams  Fluid:  2.2-2.4 L/day  Willey Blade, MS, RD, LDN Office: [phone] Pager: [phone] After Hours/Weekend Pager: [phone]

## 2018-05-27 LAB — GLUCOSE, CAPILLARY
Glucose-Capillary: 133 mg/dL — ABNORMAL HIGH (ref 70–99)
Glucose-Capillary: 183 mg/dL — ABNORMAL HIGH (ref 70–99)
Glucose-Capillary: 136 mg/dL — ABNORMAL HIGH (ref 70–99)
Glucose-Capillary: 105 mg/dL — ABNORMAL HIGH (ref 70–99)

## 2018-05-27 LAB — BASIC METABOLIC PANEL
Anion gap: 9 (ref 5–15)
BUN: 49 mg/dL — ABNORMAL HIGH (ref 8–23)
CO2: 24 mmol/L (ref 22–32)
Calcium: 7.8 mg/dL — ABNORMAL LOW (ref 8.9–10.3)
Chloride: 102 mmol/L (ref 98–111)
Creatinine, Ser: 2.21 mg/dL — ABNORMAL HIGH (ref 0.61–1.24)
GFR calc Af Amer: 31 mL/min — ABNORMAL LOW (ref >60)
GFR calc non Af Amer: 27 mL/min — ABNORMAL LOW (ref >60)
Glucose, Bld: 137 mg/dL — ABNORMAL HIGH (ref 70–99)
Potassium: 3.9 mmol/L (ref 3.5–5.1)
Sodium: 135 mmol/L (ref 135–145)

## 2018-05-27 MED ORDER — LEVEMIR 100 UNIT/ML ~~LOC~~ SOLN: 12.0000 [IU] | Freq: Every day | SUBCUTANEOUS | 0 refills | Status: DC

## 2018-05-27 MED ORDER — ALBUTEROL SULFATE HFA 108 (90 BASE) MCG/ACT IN AERS: 2.0000 | INHALATION_SPRAY | Freq: Four times a day (QID) | RESPIRATORY_TRACT | 2 refills | Status: AC | PRN

## 2018-05-27 MED ORDER — AMOXICILLIN 500 MG PO TABS: 500.0000 mg | ORAL_TABLET | Freq: Two times a day (BID) | ORAL | 0 refills | Status: AC

## 2018-05-27 MED ORDER — GUAIFENESIN-DM 100-10 MG/5ML PO SYRP: 5.0000 mL | ORAL_SOLUTION | ORAL | 0 refills | Status: DC | PRN

## 2018-05-27 MED ORDER — AMOXICILLIN 500 MG PO TABS: 500.0000 mg | ORAL_TABLET | Freq: Two times a day (BID) | ORAL | 0 refills | Status: DC

## 2018-05-27 NOTE — Care Management Important Message (Signed)
Important Message  Patient Details  Name: Arthur [name] MRN: [id number] Date of Birth: 12/28/1933   Medicare Important Message Given:  Yes    Dannette Barbara 05/27/2018, 11:43 AM

## 2018-05-27 NOTE — Consult Note (Signed)
Pharmacy Antibiotic Note  Arthur [name] is a 85 y.o. male admitted on 05/22/2018 with cellulitis.  Pharmacy has been consulted for amoxicillin renal dosing for discharge orderd. Given patient's eGFR is < 30 mL/min, the recommended dose is 250-500 mg every 12 hours.  Per discussion with Dr. Bridgett Larsson and Dr. Delaine Lame, patient is to receive one more dose of CTX prior to discharge. Will need to discontinue clindamycin and start amoxicillin. Will discontinue oral fluconazole and start any antifungal cream.   Plan: Order Amoxicillin 500 mg Q12H for 5 days for discharge.  Will order clotrimazole cream- apply to affected area twice daily for 4 weeks.     Height: _0  (180.3 cm) Weight: 251 lb 8.7 oz (114.1 kg) IBW/kg (Calculated) : 75.3  Temp (24hrs), Avg:98 F (36.7 C), Min:97.9 F (36.6 C), Max:98 F (36.7 C)  Recent Labs  Lab 05/22/18 1945 05/22/18 2000 05/23/18 0401 05/24/18 0808 05/25/18 0553 05/26/18 0414 05/27/18 0529  WBC 31.7*  --  23.9* 13.2* 11.2*  --   --   CREATININE 2.26*  --  2.30* 2.70* 2.77* 2.42* 2.21*  LATICACIDVEN  --  1.9  --   --   --   --   --     Estimated Creatinine Clearance: 33.1 mL/min (A) (by C-G formula based on SCr of 2.21 mg/dL (H)).    Allergies  Allergen Reactions  . Codeine Other (See Comments)    Constipation    Antimicrobials this admission: 5/21 Azithromycin x1  Cefepime 5/20 >> 5/21 Clindamycin 5/20 >> 5/21 CTX 5/21 >> Fluconazole 5/22 >>5/24 Metronidazole 5/20 >> 5/21  Vancomycin 5/20 x1  Dose adjustments this admission: Discontinue fluconazole    Microbiology results 5/20 BCx: STREPTOCOCCUS GROUP GSTREPTOCOCCUS GROUP G 5/21 UCx: NG 5/21 Sputum: acceptable specimen    Thank you for allowing pharmacy to be a part of this patient's care.  Rowland Lathe 05/27/2018 7:43 AM

## 2018-05-27 NOTE — TOC Progression Note (Signed)
Transition of Care [facility]) - Progression Note    Patient Details  Name: Arthur [name] MRN: [id number] Date of Birth: 1933-02-27  [facility]) CM/SW Contact  Beverly Sessions, RN Phone Number: 05/27/2018, 11:32 AM  Clinical Narrative:    Patient was to discharge today. However due to increased O2 requirement MD to cancel discharge.  Corene Cornea with [facility] notified.  RNCM following for home O2 needs. Patient does have qualify O2 sat, and dx of COPD   Expected Discharge Plan: [place] Barriers to Discharge: Continued Medical Work up  Expected Discharge Plan and Services Expected Discharge Plan: [place]   Discharge Planning Services: CM Consult Post Acute Care Choice: [place] arrangements for the past 2 months: Single Family Home Expected Discharge Date: 05/27/18                         HH Arranged: PT HH Agency: [place] ([facility]) Date HH Agency Contacted: 05/26/18 Time HH Agency Contacted: 1113     Social Determinants of Health (SDOH) Interventions    Readmission Risk Interventions Readmission Risk Prevention Plan 05/26/2018  Transportation Screening Complete  Home Care Screening Complete  Medication Review (RN CM) Complete  Some recent data might be hidden

## 2018-05-27 NOTE — Progress Notes (Signed)
Verbal orders to cancel patient discharge for today per MD. Orders placed.

## 2018-05-27 NOTE — Discharge Instructions (Signed)
HHPT °Fall precaution. °

## 2018-05-27 NOTE — Care Management (Signed)
SATURATION QUALIFICATIONS: (This note is used to comply with regulatory documentation for home oxygen)  Patient Saturations on Room Air at Rest = 93%  Patient Saturations on Room Air while Ambulating = 84%  Patient Saturations on 4 Liters of oxygen while Ambulating = 93%  Please briefly explain why patient needs home oxygen: needs o2 to maintain sats of greater than 88%

## 2018-05-27 NOTE — Progress Notes (Signed)
[place] [facility] [place] NAME: Arthur [name]    MR#:  [id number]  DATE OF BIRTH:  November 12, 1933  SUBJECTIVE:  CHIEF COMPLAINT:   Chief Complaint  Patient presents with   Fever   The patient has no complaints, he wants to go home.  He developed hypoxia 84 percent on exertion and put on oxygen by nasal cannula 4 L. REVIEW OF SYSTEMS:  Review of Systems  Constitutional: Negative for chills, fever and malaise/fatigue.  HENT: Negative for sore throat.   Eyes: Negative for blurred vision and double vision.  Respiratory: Negative for cough, hemoptysis, shortness of breath, wheezing and stridor.   Cardiovascular: Negative for chest pain, palpitations, orthopnea and leg swelling.  Gastrointestinal: Negative for abdominal pain, blood in stool, diarrhea, melena, nausea and vomiting.  Genitourinary: Negative for dysuria, flank pain and hematuria.  Musculoskeletal: Negative for back pain and joint pain.       Right leg redness, tenderness and swelling  Skin: Negative for rash.  Neurological: Negative for dizziness, sensory change, focal weakness, seizures, loss of consciousness, weakness and headaches.  Endo/Heme/Allergies: Negative for polydipsia.  Psychiatric/Behavioral: Negative for depression. The patient is not nervous/anxious.     DRUG ALLERGIES:   Allergies  Allergen Reactions   Codeine Other (See Comments)    Constipation   VITALS:  Blood pressure (!) 158/68, pulse 74, temperature 98 F (36.7 C), temperature source Oral, resp. rate 20, height 5\' 11"  (1.803 m), weight 114.1 kg, SpO2 97 %. PHYSICAL EXAMINATION:  Physical Exam Constitutional:      General: He is not in acute distress.    Appearance: Normal appearance.  HENT:     Head: Normocephalic.     Mouth/Throat:     Mouth: Mucous membranes are moist.  Eyes:     General: No scleral icterus.    Conjunctiva/sclera: Conjunctivae normal.     Pupils: Pupils are equal, round, and  reactive to light.  Neck:     Musculoskeletal: Normal range of motion and neck supple.     Vascular: No JVD.     Trachea: No tracheal deviation.  Cardiovascular:     Rate and Rhythm: Normal rate and regular rhythm.     Heart sounds: Normal heart sounds. No murmur. No gallop.   Pulmonary:     Effort: Pulmonary effort is normal. No respiratory distress.     Breath sounds: No stridor. Rhonchi present. No wheezing or rales.  Abdominal:     General: Bowel sounds are normal. There is no distension.     Palpations: Abdomen is soft.     Tenderness: There is no abdominal tenderness. There is no rebound.  Musculoskeletal: Normal range of motion.        General: No swelling or tenderness.     Right lower leg: No edema.     Left lower leg: No edema.     Comments: Better right leg redness, tenderness and swelling  Skin:    Findings: No erythema or rash.  Neurological:     General: No focal deficit present.     Mental Status: He is alert and oriented to person, place, and time.     Cranial Nerves: No cranial nerve deficit.  Psychiatric:        Mood and Affect: Mood normal.    LABORATORY PANEL:  Male CBC Recent Labs  Lab 05/25/18 0553  WBC 11.2*  HGB 7.8*  HCT 25.3*  PLT 207   ------------------------------------------------------------------------------------------------------------------ Chemistries  Recent  Labs  Lab 05/22/18 1945  05/27/18 0529  NA 137   < > 135  K 4.5   < > 3.9  CL 103   < > 102  CO2 25   < > 24  GLUCOSE 244*   < > 137*  BUN 46*   < > 49*  CREATININE 2.26*   < > 2.21*  CALCIUM 8.1*   < > 7.8*  AST 18  --   --   ALT 12  --   --   ALKPHOS 53  --   --   BILITOT 1.0  --   --    < > = values in this interval not displayed.   RADIOLOGY:  No results found. ASSESSMENT AND PLAN:   1.  Sepsis due to leg cellulitis, pneumonia and strep bacteremia. - Patient was started on broad-spectrum IV antibiotic therapy with cefepime, vancomycin, and azithromycin for  right leg cellulitis and community-acquired pneumonia He received IV fluid bolus in the emergency room per protocol with continued normal saline. Blood cultures show Streptococcus species.  Leukocytosis is improving. Pneumonia is ruled out.  Changed antibiotics as below. Changed to Rocephin 2 g IV. Added clindamycin.  B/l fungal infection feet- added fluconazole.  Discontinue fluconazole and antifungal cream per Dr. Evalee Mutton. 5 days IV antibiotics as above then transition to p.o. amoxicillin for 5 days per Dr. Verita Lamb.  2.  Right lower extremity cellulitis Antibiotics as above. - Bilateral lower extremity ultrasound negative for DVT  3.  Community-acquired pneumonia Continue DuoNeb as needed, Robitussin as needed, Mucinex.  Acute respiratory failure with hypoxia due to pneumonia and underlying COPD. Unable to wean off oxygen. Treatment as above.  4.  Diabetes mellitus - Moderate sliding scale insulin - Hemoglobin A1c 7.1.  5.  Acute kidney injury on CKD stage III. - Likely secondary to sepsis with possible dehydration Improving with IV fluid support and follow-up BMP.  6.  COPD Stable, DuoNeb as needed. Anemia of chronic disease.  Stable, follow-up hematologist as outpatient.  Generalized weakness.  Ambulate patient and PT evaluation: HHPT.  I discussed with Dr. Doreene Eland. All the records are reviewed and case discussed with Care Management/Social Worker. Management plans discussed with the patient, his daughter and they are in agreement.  CODE STATUS: DNR  TOTAL TIME TAKING CARE OF THIS PATIENT: 32 minutes.   More than 50% of the time was spent in counseling/coordination of care: YES  POSSIBLE D/C IN 2 DAYS, DEPENDING ON CLINICAL CONDITION.   Demetrios Loll M.D on 05/27/2018 at 2:46 PM  Between 7am to 6pm - Pager - [phone]  After 6pm go to [email or web address] - [facility]

## 2018-05-27 NOTE — Progress Notes (Signed)
   Date of Admission:  05/22/2018        Subjective: Doing better Says rt leg is feeling better Has baseline cough and congestion Medications:  . enoxaparin (LOVENOX) injection  40 mg Subcutaneous Q24H  . folic acid  1 mg Oral Daily  . guaiFENesin  600 mg Oral BID  . insulin aspart  0-15 Units Subcutaneous TID WC  . insulin aspart  0-5 Units Subcutaneous QHS  . insulin aspart  6 Units Subcutaneous TID WC  . insulin detemir  12 Units Subcutaneous Daily  . ipratropium-albuterol  3 mL Nebulization TID  . mouth rinse  15 mL Mouth Rinse BID  . multivitamin with minerals  1 tablet Oral Daily  . pantoprazole  40 mg Oral Daily  . Ensure Max Protein  11 oz Oral BID BM  . thiamine  100 mg Oral Daily    Objective: Vital signs in last 24 hours: Temp:  [97.9 F (36.6 C)-98 F (36.7 C)] 98 F (36.7 C) (05/25 0416) Pulse Rate:  [69-78] 71 (05/25 0416) Resp:  [16-24] 20 (05/25 0416) BP: (139-164)/(46-59) 164/55 (05/25 0416) SpO2:  [85 %-96 %] 93 % (05/25 0416)  PHYSICAL EXAM:  General: Alert, cooperative, no distress, appears stated age.  Extremities: Rt leg 05/27/18   Rt leg 05/23/18   Skin: No rashes or lesions. Or bruising Lymph: Cervical, supraclavicular normal. Neurologic: Grossly non-focal  Lab Results Recent Labs    05/25/18 0553 05/26/18 0414 05/27/18 0529  WBC 11.2*  --   --   HGB 7.8*  --   --   HCT 25.3*  --   --   NA 135 135 135  K 4.3 4.2 3.9  CL 103 103 102  CO2 25 26 24   BUN 59* 56* 49*  CREATININE 2.77* 2.42* 2.21*   Liver Panel No results for input(s): PROT, ALBUMIN, AST, ALT, ALKPHOS, BILITOT, BILIDIR, IBILI in the last 72 hours. Sedimentation Rate No results for input(s): ESRSEDRATE in the last 72 hours. C-Reactive Protein No results for input(s): CRP in the last 72 hours.  Microbiology: Blood culture streptococcus group G Studies/Results: No results found.   Assessment/Plan: Streptococcus Group G bacteremia due to cellulitis rt leg  On  ceftriaxone and clindamycin- day 6- Dc clindamycin as the leg looks much better- He will need antibiotic until 06/02/18 .   HE can be sent on Po amoxicillin 500mg  Q 12 for 5 more days when ready to be discharged- Until then continue Iv ceftriaxone.  Fungal infection feet- was on fluconazole- can be changed to topical cream  CKD- management as per primary team  ID will sign off- call if needed

## 2018-05-28 LAB — GLUCOSE, CAPILLARY
Glucose-Capillary: 148 mg/dL — ABNORMAL HIGH (ref 70–99)
Glucose-Capillary: 133 mg/dL — ABNORMAL HIGH (ref 70–99)

## 2018-05-28 MED ORDER — IPRATROPIUM-ALBUTEROL 0.5-2.5 (3) MG/3ML IN SOLN: 3.0000 mL | Freq: Four times a day (QID) | RESPIRATORY_TRACT | 0 refills | Status: DC | PRN

## 2018-05-28 MED ORDER — NYSTATIN 100000 UNIT/GM EX CREA
TOPICAL_CREAM | Freq: Two times a day (BID) | CUTANEOUS | Status: DC
  Administered 2018-05-28: 10:00:00 via TOPICAL
  Filled 2018-05-28: qty 15

## 2018-05-28 MED ORDER — NYSTATIN 100000 UNIT/GM EX CREA: TOPICAL_CREAM | Freq: Two times a day (BID) | CUTANEOUS | 0 refills | Status: DC

## 2018-05-28 MED ORDER — AMOXICILLIN 500 MG PO CAPS
500.0000 mg | ORAL_CAPSULE | Freq: Three times a day (TID) | ORAL | Status: DC
  Administered 2018-05-28 (×2): 500 mg via ORAL
  Filled 2018-05-28 (×4): qty 1

## 2018-05-28 NOTE — TOC Transition Note (Signed)
Transition of Care [facility]) - CM/SW Discharge Note   Patient Details  Name: Arthur [name] MRN: [id number] Date of Birth: 07/28/1933  Transition of Care [facility]) CM/SW Contact:  Beverly Sessions, RN Phone Number: 05/28/2018, 4:42 PM   Clinical Narrative:    Patient to discharge home today.  Patient to discharge with home O2 and nebulizer.  Brad from [place] delivered to room prior to discharge.    Corene Cornea with [facility] notified of discharge.   Daughter to transport at discharge    Final next level of care: [place] Barriers to Discharge: Continued Medical Work up   Patient Goals and CMS Choice Patient states their goals for this hospitalization and ongoing recovery are:: to get my breathing better and go home CMS Medicare.gov Compare Post Acute Care list provided to:: Patient Choice offered to / list presented to : Patient  Discharge Placement                       Discharge Plan and Services   Discharge Planning Services: CM Consult Post Acute Care Choice: Home Health          DME Arranged: Oxygen, Nebulizer machine DME Agency: [employer] Date DME Agency Contacted: 05/28/18 Time DME Agency Contacted: 65 Representative spoke with at DME Agency: [place]: RN, PT, Nurse's Aide, Social Work [employer] Agency: [place] ([place]) Date [place]: 05/28/18 Time [place]: [place] Representative spoke with at [place]: [place] ([place]) Interventions     Readmission Risk Interventions Readmission Risk Prevention Plan 05/26/2018  Transportation Screening Complete  Home Care Screening Complete  Medication Review (RN CM) Complete  Some recent data might be hidden

## 2018-05-28 NOTE — Discharge Summary (Signed)
[facility] [place] NAME: Arthur [name]    MR#:  [id number]  DATE OF BIRTH:  [birth year]  DATE OF ADMISSION:  05/22/2018 ADMITTING PHYSICIAN: Otila Back, MD  DATE OF DISCHARGE: 05/28/2018  PRIMARY CARE PHYSICIAN: Baxter Hire, MD    ADMISSION DIAGNOSIS:  Cellulitis of right lower extremity [L03.115] Sepsis ([place]) [A41.9] Sepsis, due to unspecified organism, unspecified whether acute organ dysfunction present ([place]) [A41.9]  DISCHARGE DIAGNOSIS:  Active Problems:   Cellulitis of right lower extremity   Sepsis ([place])   SECONDARY DIAGNOSIS:   Past Medical History:  Diagnosis Date  . COPD (chronic obstructive pulmonary disease) ([place])   . Diabetes mellitus without complication ([place])   . Hyperlipidemia   . Hypertension     HOSPITAL COURSE:   85 year old male with a history of COPD, diabetes and hypertension who presented to the emergency room due to shortness of breath.  1.   acute hypoxic respiratory failure in the setting of pneumonia/COPD exacerbation: Patient will require oxygen at discharge. Patient will need nebulizer at discharge.  2.  Pneumonia: Patient treated with antibiotics.  3.  Sepsis: Patient presented with low-grade fever and leukocytosis.  Leukocytosis improved.  Sepsis is improved.  Sepsis due to strep group G bacteremia due to cellulitis of the right leg. Patient evaluated by infectious disease consultant.  Patient will be discharged on amoxicillin 500 mg for 5 more days.   4.  Chronic kidney disease stage III: Creatinine remains at baseline  5.  Diabetes: Continue outpatient regimen with ADA diet.  6.  Feet fungus: ID is recommending antifungal cream.  DISCHARGE CONDITIONS AND DIET:   Stable for discharge Diabetic diet  CONSULTS OBTAINED:  Treatment Team:  Tsosie Billing, MD  DRUG ALLERGIES:   Allergies  Allergen Reactions  . Codeine Other (See Comments)    Constipation    DISCHARGE  MEDICATIONS:   Allergies as of 05/28/2018      Reactions   Codeine Other (See Comments)   Constipation      Medication List    STOP taking these medications   clotrimazole-betamethasone cream Commonly known as:  LOTRISONE   lisinopril-hydrochlorothiazide 20-12.5 MG tablet Commonly known as:  ZESTORETIC   NovoLOG 100 UNIT/ML injection Generic drug:  insulin aspart     TAKE these medications   albuterol 108 (90 Base) MCG/ACT inhaler Commonly known as:  VENTOLIN HFA Inhale 2 puffs into the lungs every 6 (six) hours as needed for wheezing or shortness of breath.   amLODipine 2.5 MG tablet Commonly known as:  NORVASC Take 2.5 mg by mouth daily.   amoxicillin 500 MG tablet Commonly known as:  AMOXIL Take 1 tablet (500 mg total) by mouth 2 (two) times daily for 5 days.   furosemide 20 MG tablet Commonly known as:  LASIX Take 20 mg by mouth daily as needed for edema.   guaiFENesin-dextromethorphan 100-10 MG/5ML syrup Commonly known as:  ROBITUSSIN DM Take 5 mLs by mouth every 4 (four) hours as needed for cough.   ipratropium-albuterol 0.5-2.5 (3) MG/3ML Soln Commonly known as:  DUONEB Take 3 mLs by nebulization every 6 (six) hours as needed.   Levemir 100 UNIT/ML injection Generic drug:  insulin detemir Inject 0.12 mLs (12 Units total) into the skin daily. What changed:    how much to take  when to take this   nystatin cream Commonly known as:  MYCOSTATIN Apply topically 2 (two) times daily.   pravastatin 10 MG tablet Commonly  known as:  PRAVACHOL Take 10 mg by mouth at bedtime.   tamsulosin 0.4 MG Caps capsule Commonly known as:  FLOMAX Take 0.4 mg by mouth daily.            Durable Medical Equipment  (From admission, onward)         Start     Ordered   05/28/18 1020  For home use only DME oxygen  Once    Question Answer Comment  Length of Need Lifetime   Mode or (Route) Nasal cannula   Liters per Minute 2   Frequency Continuous (stationary  and portable oxygen unit needed)   Oxygen conserving device Yes   Oxygen delivery system Other see comments      05/28/18 1021   05/28/18 0000  DME Nebulizer machine    Question:  Patient needs a nebulizer to treat with the following condition  Answer:  COPD (chronic obstructive pulmonary disease) ([place])   05/28/18 1021            Today   CHIEF COMPLAINT:   Patient doing well.  Wants to go home.  Denies shortness of breath or wheezing.   VITAL SIGNS:  Blood pressure (!) 164/59, pulse 72, temperature 98.2 F (36.8 C), temperature source Oral, resp. rate 20, height 5\' 11"  (1.803 m), weight 114.1 kg, SpO2 99 %.   REVIEW OF SYSTEMS:  Review of Systems  Constitutional: Negative.  Negative for chills, fever and malaise/fatigue.  HENT: Negative.  Negative for ear discharge, ear pain, hearing loss, nosebleeds and sore throat.   Eyes: Negative.  Negative for blurred vision and pain.  Respiratory: Negative.  Negative for cough, hemoptysis, shortness of breath and wheezing.   Cardiovascular: Negative.  Negative for chest pain, palpitations and leg swelling.  Gastrointestinal: Negative.  Negative for abdominal pain, blood in stool, diarrhea, nausea and vomiting.  Genitourinary: Negative.  Negative for dysuria.  Musculoskeletal: Negative.  Negative for back pain.  Skin: Negative.   Neurological: Negative for dizziness, tremors, speech change, focal weakness, seizures and headaches.  Endo/Heme/Allergies: Negative.  Does not bruise/bleed easily.  Psychiatric/Behavioral: Negative.  Negative for depression, hallucinations and suicidal ideas.     PHYSICAL EXAMINATION:  GENERAL:  85 y.o.-year-old patient lying in the bed with no acute distress.  NECK:  Supple, no jugular venous distention. No thyroid enlargement, no tenderness.  LUNGS: Normal breath sounds bilaterally, no wheezing, rales,rhonchi  No use of accessory muscles of respiration.  CARDIOVASCULAR: S1, S2 normal. No murmurs,  rubs, or gallops.  ABDOMEN: Soft, non-tender, non-distended. Bowel sounds present. No organomegaly or mass.  EXTREMITIES: No pedal edema, cyanosis, or clubbing.  PSYCHIATRIC: The patient is alert and oriented x 3.  SKIN: No obvious rash, lesion, or ulcer.   DATA REVIEW:   CBC Recent Labs  Lab 05/25/18 0553  WBC 11.2*  HGB 7.8*  HCT 25.3*  PLT 207    Chemistries  Recent Labs  Lab 05/22/18 1945  05/27/18 0529  NA 137   < > 135  K 4.5   < > 3.9  CL 103   < > 102  CO2 25   < > 24  GLUCOSE 244*   < > 137*  BUN 46*   < > 49*  CREATININE 2.26*   < > 2.21*  CALCIUM 8.1*   < > 7.8*  AST 18  --   --   ALT 12  --   --   ALKPHOS 53  --   --  BILITOT 1.0  --   --    < > = values in this interval not displayed.    Cardiac Enzymes No results for input(s): TROPONINI in the last 168 hours.  Microbiology Results  @MICRORSLT48 @  RADIOLOGY:  No results found.    Allergies as of 05/28/2018      Reactions   Codeine Other (See Comments)   Constipation      Medication List    STOP taking these medications   clotrimazole-betamethasone cream Commonly known as:  LOTRISONE   lisinopril-hydrochlorothiazide 20-12.5 MG tablet Commonly known as:  ZESTORETIC   NovoLOG 100 UNIT/ML injection Generic drug:  insulin aspart     TAKE these medications   albuterol 108 (90 Base) MCG/ACT inhaler Commonly known as:  VENTOLIN HFA Inhale 2 puffs into the lungs every 6 (six) hours as needed for wheezing or shortness of breath.   amLODipine 2.5 MG tablet Commonly known as:  NORVASC Take 2.5 mg by mouth daily.   amoxicillin 500 MG tablet Commonly known as:  AMOXIL Take 1 tablet (500 mg total) by mouth 2 (two) times daily for 5 days.   furosemide 20 MG tablet Commonly known as:  LASIX Take 20 mg by mouth daily as needed for edema.   guaiFENesin-dextromethorphan 100-10 MG/5ML syrup Commonly known as:  ROBITUSSIN DM Take 5 mLs by mouth every 4 (four) hours as needed for cough.    ipratropium-albuterol 0.5-2.5 (3) MG/3ML Soln Commonly known as:  DUONEB Take 3 mLs by nebulization every 6 (six) hours as needed.   Levemir 100 UNIT/ML injection Generic drug:  insulin detemir Inject 0.12 mLs (12 Units total) into the skin daily. What changed:    how much to take  when to take this   nystatin cream Commonly known as:  MYCOSTATIN Apply topically 2 (two) times daily.   pravastatin 10 MG tablet Commonly known as:  PRAVACHOL Take 10 mg by mouth at bedtime.   tamsulosin 0.4 MG Caps capsule Commonly known as:  FLOMAX Take 0.4 mg by mouth daily.            Durable Medical Equipment  (From admission, onward)         Start     Ordered   05/28/18 1020  For home use only DME oxygen  Once    Question Answer Comment  Length of Need Lifetime   Mode or (Route) Nasal cannula   Liters per Minute 2   Frequency Continuous (stationary and portable oxygen unit needed)   Oxygen conserving device Yes   Oxygen delivery system Other see comments      05/28/18 1021   05/28/18 0000  DME Nebulizer machine    Question:  Patient needs a nebulizer to treat with the following condition  Answer:  COPD (chronic obstructive pulmonary disease) ([place])   05/28/18 1021            Management plans discussed with the patient and he is in agreement. Stable for discharge   Patient should follow up with pcp  CODE STATUS:     Code Status Orders  (From admission, onward)         Start     Ordered   05/23/18 0919  Do not attempt resuscitation (DNR)  Continuous    Question Answer Comment  In the event of cardiac or respiratory ARREST Do not call a "code blue"   In the event of cardiac or respiratory ARREST Do not perform Intubation, CPR, defibrillation or ACLS  In the event of cardiac or respiratory ARREST Use medication by any route, position, wound care, and other measures to relive pain and suffering. May use oxygen, suction and manual treatment of airway obstruction  as needed for comfort.      05/23/18 0918        Code Status History    Date Active Date Inactive Code Status Order ID Comments User Context   05/23/2018 0104 05/23/2018 0918 Full Code [id number]  Mayer Camel, NP Inpatient      TOTAL TIME TAKING CARE OF THIS PATIENT: 38 minutes.    Note: This dictation was prepared with Dragon dictation along with smaller phrase technology. Any transcriptional errors that result from this process are unintentional.  Bettey Costa M.D on 05/28/2018 at 1:23 PM  Between 7am to 6pm - Pager - [phone] After 6pm go to [email or web address] - password [email or web address] [facility]  Office  [phone]  CC: Primary care physician; Baxter Hire, MD

## 2018-05-28 NOTE — Progress Notes (Signed)
SATURATION QUALIFICATIONS: (This note is used to comply with regulatory documentation for home oxygen)  Patient Saturations on Room Air at Rest = 93% in bed  Patient Saturations on Room Air while Ambulating = 82%  Patient Saturations on 2 Liters of oxygen while Ambulating = 90%  Patient Saturations on 3 Liters of oxygen while Ambulating = 92%  Please briefly explain why patient needs home oxygen: patient was unable to maintain oxygen saturation > 88% on room air while ambulating

## 2018-05-29 DIAGNOSIS — I872 Venous insufficiency (chronic) (peripheral): Secondary | ICD-10-CM | POA: Diagnosis not present

## 2018-05-29 DIAGNOSIS — L03115 Cellulitis of right lower limb: Secondary | ICD-10-CM | POA: Diagnosis not present

## 2018-05-29 DIAGNOSIS — I129 Hypertensive chronic kidney disease with stage 1 through stage 4 chronic kidney disease, or unspecified chronic kidney disease: Secondary | ICD-10-CM | POA: Diagnosis not present

## 2018-05-29 DIAGNOSIS — J449 Chronic obstructive pulmonary disease, unspecified: Secondary | ICD-10-CM | POA: Diagnosis not present

## 2018-05-29 DIAGNOSIS — N183 Chronic kidney disease, stage 3 (moderate): Secondary | ICD-10-CM | POA: Diagnosis not present

## 2018-05-29 DIAGNOSIS — E1122 Type 2 diabetes mellitus with diabetic chronic kidney disease: Secondary | ICD-10-CM | POA: Diagnosis not present

## 2018-05-29 DIAGNOSIS — B9689 Other specified bacterial agents as the cause of diseases classified elsewhere: Secondary | ICD-10-CM | POA: Diagnosis not present

## 2018-05-29 DIAGNOSIS — Z87891 Personal history of nicotine dependence: Secondary | ICD-10-CM | POA: Diagnosis not present

## 2018-05-29 DIAGNOSIS — R7881 Bacteremia: Secondary | ICD-10-CM | POA: Diagnosis not present

## 2018-06-03 DIAGNOSIS — R7881 Bacteremia: Secondary | ICD-10-CM | POA: Diagnosis not present

## 2018-06-03 DIAGNOSIS — I872 Venous insufficiency (chronic) (peripheral): Secondary | ICD-10-CM | POA: Diagnosis not present

## 2018-06-03 DIAGNOSIS — J441 Chronic obstructive pulmonary disease with (acute) exacerbation: Secondary | ICD-10-CM | POA: Diagnosis not present

## 2018-06-03 DIAGNOSIS — L03115 Cellulitis of right lower limb: Secondary | ICD-10-CM | POA: Diagnosis not present

## 2018-06-03 DIAGNOSIS — I129 Hypertensive chronic kidney disease with stage 1 through stage 4 chronic kidney disease, or unspecified chronic kidney disease: Secondary | ICD-10-CM | POA: Diagnosis not present

## 2018-06-03 DIAGNOSIS — Z87891 Personal history of nicotine dependence: Secondary | ICD-10-CM | POA: Diagnosis not present

## 2018-06-03 DIAGNOSIS — B9689 Other specified bacterial agents as the cause of diseases classified elsewhere: Secondary | ICD-10-CM | POA: Diagnosis not present

## 2018-06-03 DIAGNOSIS — J449 Chronic obstructive pulmonary disease, unspecified: Secondary | ICD-10-CM | POA: Diagnosis not present

## 2018-06-03 DIAGNOSIS — E1122 Type 2 diabetes mellitus with diabetic chronic kidney disease: Secondary | ICD-10-CM | POA: Diagnosis not present

## 2018-06-03 DIAGNOSIS — N183 Chronic kidney disease, stage 3 (moderate): Secondary | ICD-10-CM | POA: Diagnosis not present

## 2018-06-04 DIAGNOSIS — J431 Panlobular emphysema: Secondary | ICD-10-CM | POA: Diagnosis not present

## 2018-06-04 DIAGNOSIS — L03119 Cellulitis of unspecified part of limb: Secondary | ICD-10-CM | POA: Diagnosis not present

## 2018-06-04 DIAGNOSIS — J189 Pneumonia, unspecified organism: Secondary | ICD-10-CM | POA: Diagnosis not present

## 2018-06-04 DIAGNOSIS — Z09 Encounter for follow-up examination after completed treatment for conditions other than malignant neoplasm: Secondary | ICD-10-CM | POA: Diagnosis not present

## 2018-06-05 DIAGNOSIS — R7881 Bacteremia: Secondary | ICD-10-CM | POA: Diagnosis not present

## 2018-06-05 DIAGNOSIS — I878 Other specified disorders of veins: Secondary | ICD-10-CM | POA: Diagnosis not present

## 2018-06-05 DIAGNOSIS — Z87891 Personal history of nicotine dependence: Secondary | ICD-10-CM | POA: Diagnosis not present

## 2018-06-05 DIAGNOSIS — I129 Hypertensive chronic kidney disease with stage 1 through stage 4 chronic kidney disease, or unspecified chronic kidney disease: Secondary | ICD-10-CM | POA: Diagnosis not present

## 2018-06-05 DIAGNOSIS — E1122 Type 2 diabetes mellitus with diabetic chronic kidney disease: Secondary | ICD-10-CM | POA: Diagnosis not present

## 2018-06-05 DIAGNOSIS — N183 Chronic kidney disease, stage 3 (moderate): Secondary | ICD-10-CM | POA: Diagnosis not present

## 2018-06-05 DIAGNOSIS — E1142 Type 2 diabetes mellitus with diabetic polyneuropathy: Secondary | ICD-10-CM | POA: Diagnosis not present

## 2018-06-05 DIAGNOSIS — J449 Chronic obstructive pulmonary disease, unspecified: Secondary | ICD-10-CM | POA: Diagnosis not present

## 2018-06-05 DIAGNOSIS — I872 Venous insufficiency (chronic) (peripheral): Secondary | ICD-10-CM | POA: Diagnosis not present

## 2018-06-05 DIAGNOSIS — B9689 Other specified bacterial agents as the cause of diseases classified elsewhere: Secondary | ICD-10-CM | POA: Diagnosis not present

## 2018-06-05 DIAGNOSIS — L03115 Cellulitis of right lower limb: Secondary | ICD-10-CM | POA: Diagnosis not present

## 2018-06-05 DIAGNOSIS — B351 Tinea unguium: Secondary | ICD-10-CM | POA: Diagnosis not present

## 2018-06-07 DIAGNOSIS — B9689 Other specified bacterial agents as the cause of diseases classified elsewhere: Secondary | ICD-10-CM | POA: Diagnosis not present

## 2018-06-07 DIAGNOSIS — R7881 Bacteremia: Secondary | ICD-10-CM | POA: Diagnosis not present

## 2018-06-07 DIAGNOSIS — L03115 Cellulitis of right lower limb: Secondary | ICD-10-CM | POA: Diagnosis not present

## 2018-06-07 DIAGNOSIS — N183 Chronic kidney disease, stage 3 (moderate): Secondary | ICD-10-CM | POA: Diagnosis not present

## 2018-06-07 DIAGNOSIS — Z87891 Personal history of nicotine dependence: Secondary | ICD-10-CM | POA: Diagnosis not present

## 2018-06-07 DIAGNOSIS — I872 Venous insufficiency (chronic) (peripheral): Secondary | ICD-10-CM | POA: Diagnosis not present

## 2018-06-07 DIAGNOSIS — E1122 Type 2 diabetes mellitus with diabetic chronic kidney disease: Secondary | ICD-10-CM | POA: Diagnosis not present

## 2018-06-07 DIAGNOSIS — J449 Chronic obstructive pulmonary disease, unspecified: Secondary | ICD-10-CM | POA: Diagnosis not present

## 2018-06-07 DIAGNOSIS — I129 Hypertensive chronic kidney disease with stage 1 through stage 4 chronic kidney disease, or unspecified chronic kidney disease: Secondary | ICD-10-CM | POA: Diagnosis not present

## 2018-06-09 ENCOUNTER — Other Ambulatory Visit: Payer: Self-pay

## 2018-06-09 ENCOUNTER — Emergency Department: Payer: Medicare HMO

## 2018-06-09 ENCOUNTER — Inpatient Hospital Stay
Admission: EM | Admit: 2018-06-09 | Discharge: 2018-06-14 | DRG: 378 | Disposition: A | Discharge status: Home Health | Payer: Medicare HMO | Attending: Internal Medicine | Admitting: Internal Medicine

## 2018-06-09 ENCOUNTER — Encounter: Payer: Self-pay | Admitting: Emergency Medicine

## 2018-06-09 ENCOUNTER — Inpatient Hospital Stay: Payer: Medicare HMO

## 2018-06-09 DIAGNOSIS — N179 Acute kidney failure, unspecified: Secondary | ICD-10-CM | POA: Diagnosis not present

## 2018-06-09 DIAGNOSIS — Z9981 Dependence on supplemental oxygen: Secondary | ICD-10-CM | POA: Diagnosis not present

## 2018-06-09 DIAGNOSIS — K429 Umbilical hernia without obstruction or gangrene: Secondary | ICD-10-CM | POA: Diagnosis not present

## 2018-06-09 DIAGNOSIS — Z720 Tobacco use: Secondary | ICD-10-CM | POA: Diagnosis not present

## 2018-06-09 DIAGNOSIS — I10 Essential (primary) hypertension: Secondary | ICD-10-CM | POA: Diagnosis not present

## 2018-06-09 DIAGNOSIS — I129 Hypertensive chronic kidney disease with stage 1 through stage 4 chronic kidney disease, or unspecified chronic kidney disease: Secondary | ICD-10-CM | POA: Diagnosis present

## 2018-06-09 DIAGNOSIS — J9611 Chronic respiratory failure with hypoxia: Secondary | ICD-10-CM | POA: Diagnosis present

## 2018-06-09 DIAGNOSIS — N184 Chronic kidney disease, stage 4 (severe): Secondary | ICD-10-CM | POA: Diagnosis present

## 2018-06-09 DIAGNOSIS — E669 Obesity, unspecified: Secondary | ICD-10-CM | POA: Diagnosis present

## 2018-06-09 DIAGNOSIS — L03115 Cellulitis of right lower limb: Secondary | ICD-10-CM | POA: Diagnosis not present

## 2018-06-09 DIAGNOSIS — E785 Hyperlipidemia, unspecified: Secondary | ICD-10-CM | POA: Diagnosis present

## 2018-06-09 DIAGNOSIS — K921 Melena: Secondary | ICD-10-CM | POA: Diagnosis not present

## 2018-06-09 DIAGNOSIS — E1122 Type 2 diabetes mellitus with diabetic chronic kidney disease: Secondary | ICD-10-CM | POA: Diagnosis present

## 2018-06-09 DIAGNOSIS — Z66 Do not resuscitate: Secondary | ICD-10-CM | POA: Diagnosis present

## 2018-06-09 DIAGNOSIS — Z6835 Body mass index (BMI) 35.0-35.9, adult: Secondary | ICD-10-CM

## 2018-06-09 DIAGNOSIS — N189 Chronic kidney disease, unspecified: Secondary | ICD-10-CM | POA: Diagnosis not present

## 2018-06-09 DIAGNOSIS — J9621 Acute and chronic respiratory failure with hypoxia: Secondary | ICD-10-CM | POA: Diagnosis not present

## 2018-06-09 DIAGNOSIS — J309 Allergic rhinitis, unspecified: Secondary | ICD-10-CM | POA: Diagnosis present

## 2018-06-09 DIAGNOSIS — D500 Iron deficiency anemia secondary to blood loss (chronic): Secondary | ICD-10-CM

## 2018-06-09 DIAGNOSIS — Z20828 Contact with and (suspected) exposure to other viral communicable diseases: Secondary | ICD-10-CM | POA: Diagnosis not present

## 2018-06-09 DIAGNOSIS — R531 Weakness: Secondary | ICD-10-CM

## 2018-06-09 DIAGNOSIS — J441 Chronic obstructive pulmonary disease with (acute) exacerbation: Secondary | ICD-10-CM | POA: Diagnosis not present

## 2018-06-09 DIAGNOSIS — E119 Type 2 diabetes mellitus without complications: Secondary | ICD-10-CM | POA: Diagnosis not present

## 2018-06-09 DIAGNOSIS — D62 Acute posthemorrhagic anemia: Secondary | ICD-10-CM | POA: Diagnosis not present

## 2018-06-09 DIAGNOSIS — Z79899 Other long term (current) drug therapy: Secondary | ICD-10-CM | POA: Diagnosis not present

## 2018-06-09 DIAGNOSIS — Z794 Long term (current) use of insulin: Secondary | ICD-10-CM | POA: Diagnosis not present

## 2018-06-09 DIAGNOSIS — E875 Hyperkalemia: Secondary | ICD-10-CM | POA: Diagnosis not present

## 2018-06-09 DIAGNOSIS — I700 Atherosclerosis of aorta: Secondary | ICD-10-CM | POA: Diagnosis not present

## 2018-06-09 DIAGNOSIS — K8020 Calculus of gallbladder without cholecystitis without obstruction: Secondary | ICD-10-CM | POA: Diagnosis not present

## 2018-06-09 DIAGNOSIS — L03116 Cellulitis of left lower limb: Secondary | ICD-10-CM | POA: Diagnosis not present

## 2018-06-09 DIAGNOSIS — K922 Gastrointestinal hemorrhage, unspecified: Secondary | ICD-10-CM | POA: Diagnosis present

## 2018-06-09 DIAGNOSIS — J449 Chronic obstructive pulmonary disease, unspecified: Secondary | ICD-10-CM | POA: Diagnosis present

## 2018-06-09 DIAGNOSIS — Z885 Allergy status to narcotic agent status: Secondary | ICD-10-CM

## 2018-06-09 DIAGNOSIS — Z7983 Long term (current) use of bisphosphonates: Secondary | ICD-10-CM | POA: Diagnosis not present

## 2018-06-09 DIAGNOSIS — D649 Anemia, unspecified: Secondary | ICD-10-CM | POA: Diagnosis not present

## 2018-06-09 DIAGNOSIS — D631 Anemia in chronic kidney disease: Secondary | ICD-10-CM | POA: Diagnosis present

## 2018-06-09 DIAGNOSIS — Z03818 Encounter for observation for suspected exposure to other biological agents ruled out: Secondary | ICD-10-CM | POA: Diagnosis not present

## 2018-06-09 LAB — BLOOD GAS, VENOUS
Acid-Base Excess: 1.9 mmol/L (ref 0.0–2.0)
Bicarbonate: 28.2 mmol/L — ABNORMAL HIGH (ref 20.0–28.0)
O2 Saturation: 26.9 %
Patient temperature: 37
pCO2, Ven: 50 mmHg (ref 44.0–60.0)
pH, Ven: 7.36 (ref 7.250–7.430)

## 2018-06-09 LAB — COMPREHENSIVE METABOLIC PANEL
ALT: 17 U/L (ref 0–44)
AST: 21 U/L (ref 15–41)
Albumin: 2.9 g/dL — ABNORMAL LOW (ref 3.5–5.0)
Alkaline Phosphatase: 33 U/L — ABNORMAL LOW (ref 38–126)
Anion gap: 9 (ref 5–15)
BUN: 72 mg/dL — ABNORMAL HIGH (ref 8–23)
CO2: 25 mmol/L (ref 22–32)
Calcium: 7.8 mg/dL — ABNORMAL LOW (ref 8.9–10.3)
Chloride: 103 mmol/L (ref 98–111)
Creatinine, Ser: 2.51 mg/dL — ABNORMAL HIGH (ref 0.61–1.24)
GFR calc Af Amer: 26 mL/min — ABNORMAL LOW (ref >60)
GFR calc non Af Amer: 23 mL/min — ABNORMAL LOW (ref >60)
Glucose, Bld: 177 mg/dL — ABNORMAL HIGH (ref 70–99)
Potassium: 4.5 mmol/L (ref 3.5–5.1)
Sodium: 137 mmol/L (ref 135–145)
Total Bilirubin: 0.7 mg/dL (ref 0.3–1.2)
Total Protein: 5.6 g/dL — ABNORMAL LOW (ref 6.5–8.1)

## 2018-06-09 LAB — LACTIC ACID, PLASMA
Lactic Acid, Venous: 2.4 mmol/L (ref 0.5–1.9)
Lactic Acid, Venous: 2.2 mmol/L (ref 0.5–1.9)
Lactic Acid, Venous: 1.3 mmol/L (ref 0.5–1.9)

## 2018-06-09 LAB — CBC WITH DIFFERENTIAL/PLATELET
Abs Immature Granulocytes: 0.15 10*3/uL — ABNORMAL HIGH (ref 0.00–0.07)
Basophils Absolute: 0 10*3/uL (ref 0.0–0.1)
Basophils Relative: 0 %
Eosinophils Absolute: 0 10*3/uL (ref 0.0–0.5)
Eosinophils Relative: 0 %
HCT: 12.4 % — CL (ref 39.0–52.0)
Hemoglobin: 3.6 g/dL — CL (ref 13.0–17.0)
Immature Granulocytes: 1 %
Lymphocytes Relative: 17 %
Lymphs Abs: 1.9 10*3/uL (ref 0.7–4.0)
MCH: 29 pg (ref 26.0–34.0)
MCHC: 29 g/dL — ABNORMAL LOW (ref 30.0–36.0)
MCV: 100 fL (ref 80.0–100.0)
Monocytes Absolute: 1.3 10*3/uL — ABNORMAL HIGH (ref 0.1–1.0)
Monocytes Relative: 11 %
Neutro Abs: 8.2 10*3/uL — ABNORMAL HIGH (ref 1.7–7.7)
Neutrophils Relative %: 71 %
Platelets: 272 10*3/uL (ref 150–400)
RBC: 1.24 MIL/uL — ABNORMAL LOW (ref 4.22–5.81)
RDW: 21.4 % — ABNORMAL HIGH (ref 11.5–15.5)
Smear Review: NORMAL
WBC: 11.6 10*3/uL — ABNORMAL HIGH (ref 4.0–10.5)
nRBC: 2.6 % — ABNORMAL HIGH (ref 0.0–0.2)

## 2018-06-09 LAB — URINALYSIS, COMPLETE (UACMP) WITH MICROSCOPIC
Bacteria, UA: NONE SEEN
Bilirubin Urine: NEGATIVE
Glucose, UA: NEGATIVE mg/dL
Hgb urine dipstick: NEGATIVE
Ketones, ur: NEGATIVE mg/dL
Leukocytes,Ua: NEGATIVE
Nitrite: NEGATIVE
Protein, ur: NEGATIVE mg/dL
Specific Gravity, Urine: 1.012 (ref 1.005–1.030)
Squamous Epithelial / HPF: NONE SEEN (ref 0–5)
pH: 5 (ref 5.0–8.0)

## 2018-06-09 LAB — GLUCOSE, CAPILLARY
Glucose-Capillary: 190 mg/dL — ABNORMAL HIGH (ref 70–99)
Glucose-Capillary: 196 mg/dL — ABNORMAL HIGH (ref 70–99)

## 2018-06-09 LAB — ABO/RH: ABO/RH(D): A POS

## 2018-06-09 LAB — PREPARE RBC (CROSSMATCH)

## 2018-06-09 LAB — SARS CORONAVIRUS 2 BY RT PCR (HOSPITAL ORDER, PERFORMED IN ~~LOC~~ HOSPITAL LAB): SARS Coronavirus 2: NEGATIVE

## 2018-06-09 MED ORDER — PRAVASTATIN SODIUM 20 MG PO TABS
10.0000 mg | ORAL_TABLET | Freq: Every day | ORAL | Status: DC
  Administered 2018-06-09 – 2018-06-13 (×4): 10 mg via ORAL
  Filled 2018-06-09 (×5): qty 1

## 2018-06-09 MED ORDER — FUROSEMIDE 20 MG PO TABS: 20.0000 mg | ORAL_TABLET | Freq: Every day | ORAL | Status: DC | PRN

## 2018-06-09 MED ORDER — AMLODIPINE BESYLATE 5 MG PO TABS
2.5000 mg | ORAL_TABLET | Freq: Every day | ORAL | Status: DC
  Administered 2018-06-10 – 2018-06-14 (×4): 2.5 mg via ORAL
  Filled 2018-06-09 (×4): qty 1

## 2018-06-09 MED ORDER — SODIUM CHLORIDE 0.9 % IV SOLN: 10.0000 mL/h | Freq: Once | INTRAVENOUS | Status: DC

## 2018-06-09 MED ORDER — NYSTATIN 100000 UNIT/GM EX CREA
TOPICAL_CREAM | Freq: Two times a day (BID) | CUTANEOUS | Status: DC
  Administered 2018-06-10 – 2018-06-13 (×7): via TOPICAL
  Filled 2018-06-09 (×2): qty 15

## 2018-06-09 MED ORDER — PANTOPRAZOLE SODIUM 40 MG IV SOLR
40.0000 mg | Freq: Once | INTRAVENOUS | Status: AC
  Administered 2018-06-09: 40 mg via INTRAVENOUS
  Filled 2018-06-09: qty 40

## 2018-06-09 MED ORDER — INSULIN ASPART 100 UNIT/ML ~~LOC~~ SOLN
0.0000 [IU] | Freq: Every day | SUBCUTANEOUS | Status: DC
  Administered 2018-06-10: 2 [IU] via SUBCUTANEOUS
  Filled 2018-06-09: qty 1

## 2018-06-09 MED ORDER — SODIUM CHLORIDE 0.9 % IV SOLN
8.0000 mg/h | INTRAVENOUS | Status: DC
  Administered 2018-06-09 – 2018-06-12 (×6): 8 mg/h via INTRAVENOUS
  Filled 2018-06-09 (×7): qty 80

## 2018-06-09 MED ORDER — TAMSULOSIN HCL 0.4 MG PO CAPS
0.4000 mg | ORAL_CAPSULE | Freq: Every day | ORAL | Status: DC
  Administered 2018-06-10 – 2018-06-14 (×4): 0.4 mg via ORAL
  Filled 2018-06-09 (×4): qty 1

## 2018-06-09 MED ORDER — INSULIN ASPART 100 UNIT/ML ~~LOC~~ SOLN
0.0000 [IU] | Freq: Three times a day (TID) | SUBCUTANEOUS | Status: DC
  Administered 2018-06-10 (×2): 2 [IU] via SUBCUTANEOUS
  Administered 2018-06-10: 3 [IU] via SUBCUTANEOUS
  Administered 2018-06-11: 2 [IU] via SUBCUTANEOUS
  Administered 2018-06-11: 1 [IU] via SUBCUTANEOUS
  Administered 2018-06-11 – 2018-06-12 (×2): 2 [IU] via SUBCUTANEOUS
  Administered 2018-06-12 – 2018-06-13 (×4): 1 [IU] via SUBCUTANEOUS
  Administered 2018-06-13: 2 [IU] via SUBCUTANEOUS
  Administered 2018-06-14: 09:00:00 1 [IU] via SUBCUTANEOUS
  Administered 2018-06-14: 2 [IU] via SUBCUTANEOUS
  Filled 2018-06-09 (×14): qty 1

## 2018-06-09 MED ORDER — ONDANSETRON HCL 4 MG/2ML IJ SOLN
4.0000 mg | INTRAMUSCULAR | Status: DC | PRN
  Administered 2018-06-09: 4 mg via INTRAVENOUS
  Filled 2018-06-09: qty 2

## 2018-06-09 MED ORDER — FUROSEMIDE 20 MG PO TABS: 20.0000 mg | ORAL_TABLET | Freq: Every day | ORAL | Status: DC

## 2018-06-09 MED ORDER — IPRATROPIUM-ALBUTEROL 0.5-2.5 (3) MG/3ML IN SOLN: 3.0000 mL | Freq: Four times a day (QID) | RESPIRATORY_TRACT | Status: DC | PRN

## 2018-06-09 NOTE — ED Notes (Signed)
While conducting medication history review with patient's daughter (via phone), she reported that patient soon had to increase Levemir and reintroduce Novolog due to patient's lifestyle routine. Patient had been discharged 05/28/2018 from this hospital with instructions to reduce the former to 12u and discontinue the latter. I have updated the PTA medication record to reflect these reported changes.  ** The above is intended solely for informational and/or communicative purposes. It should in no way be considered an endorsement of any specific treatment, therapy or action. **

## 2018-06-09 NOTE — Plan of Care (Signed)
  Problem: Education: Goal: Knowledge of General Education information will improve Description: Including pain rating scale, medication(s)/side effects and non-pharmacologic comfort measures Outcome: Progressing   Problem: Health Behavior/Discharge Planning: Goal: Ability to manage health-related needs will improve Outcome: Progressing   Problem: Clinical Measurements: Goal: Ability to maintain clinical measurements within normal limits will improve Outcome: Progressing Goal: Will remain free from infection Outcome: Progressing Goal: Cardiovascular complication will be avoided Outcome: Progressing   Problem: Nutrition: Goal: Adequate nutrition will be maintained Outcome: Progressing   Problem: Coping: Goal: Level of anxiety will decrease Outcome: Progressing   

## 2018-06-09 NOTE — Progress Notes (Signed)
Family Meeting Note  Advance Directive:no  Today a meeting took place with the Patient.  Patient is able to participate.  The following clinical team members were present during this meeting:MD  The following were discussed:Patient's diagnosis: GI bleed, Patient's progosis: Unable to determine and Goals for treatment: DNR   Patient states that he has lived a long life and he would not want to undergo cardiopulmomary resuscitation under any circumstances.  Additional follow-up to be provided: prn  Time spent during discussion:20 minutes  Evette Doffing, MD

## 2018-06-09 NOTE — ED Notes (Signed)
Patient transported to CT 

## 2018-06-09 NOTE — H&P (Addendum)
[facility] [place] NAME: Arthur [name]    MR#:  [id number]  DATE OF BIRTH:  [birth year]  DATE OF ADMISSION:  06/09/2018  PRIMARY CARE PHYSICIAN: Baxter Hire, MD   REQUESTING/REFERRING PHYSICIAN: Lenise Arena, MD  CHIEF COMPLAINT:   GI bleed  HISTORY OF PRESENT ILLNESS:  85 y.o. male with pertinent past medical history of COPD, hypertension, diabetes mellitus, hyperlipidemia, anemia and chronic kidney disease, and chronic kidney disease stage III presenting to the ED with generalized weakness.  Patient was recently hospitalized for sepsis due to CAP, right lower extremity cellulitis and AKI and was discharged on 05/28/2018.  Since discharge he has been confused and still complaining of bilateral lower extremity edema that seems to be worsening.  Patient is not a good historian however he stated he has been having rectal bleed for the past 2 to 3 days without associated nausea or vomiting, diarrhea, hematemesis, syncope, dizziness.  He however complains of mild stomach pain and discomfort.  In the ED, he was afebrile with blood pressure of 164/59 mmHg, heart rate 72 bpm, SPO2 99% on nasal cannula 2 L.  He was alert and oriented x3 with no obvious focal neurologic deficit.  Initial labs revealed lactic acid 2.4, glucose 177, BUN 72, creatinine 2.51, albumin 2.9, alk phos 33, WBC 11.6, hemoglobin 3.6, hematocrit 12.4, UA negative for UTI, COVID-19 pending.  Chest x-ray demonstrated interstitial thickening with no frank edema or consolidation evident.  Rectal exam was noted for dark blood per ED physician.  Patient being admitted to hospitalist service for further evaluation and management.  PAST MEDICAL HISTORY:   Past Medical History:  Diagnosis Date  . COPD (chronic obstructive pulmonary disease) ([place])   . Diabetes mellitus without complication ([place])   . Hyperlipidemia   . Hypertension     PAST SURGICAL HISTORY:  History reviewed. No  pertinent surgical history.  SOCIAL HISTORY:   Social History   Tobacco Use  . Smoking status: Former Smoker    Last attempt to quit: 11/04/1982    Years since quitting: 35.6  . Smokeless tobacco: Current User  Substance Use Topics  . Alcohol use: Yes    Comment: 1/5 /wk    FAMILY HISTORY:  History reviewed. No pertinent family history.  DRUG ALLERGIES:   Allergies  Allergen Reactions  . Codeine Other (See Comments)    Constipation    REVIEW OF SYSTEMS:   Review of Systems  Constitutional: Negative for chills, fever, malaise/fatigue and weight loss.  HENT: Negative for congestion, hearing loss and sore throat.   Eyes: Negative for blurred vision and double vision.  Respiratory: Positive for cough, shortness of breath and wheezing.   Cardiovascular: Positive for orthopnea and leg swelling. Negative for chest pain and palpitations.  Gastrointestinal: Positive for abdominal pain, blood in stool and melena. Negative for diarrhea, nausea and vomiting.  Genitourinary: Negative for dysuria, hematuria and urgency.  Musculoskeletal: Negative for myalgias.  Skin: Negative for rash.  Neurological: Negative for dizziness, sensory change, speech change, focal weakness and headaches.  Psychiatric/Behavioral: Negative for depression.   MEDICATIONS AT HOME:   Prior to Admission medications   Medication Sig Start Date End Date Taking? Authorizing Provider  albuterol (VENTOLIN HFA) 108 (90 Base) MCG/ACT inhaler Inhale 2 puffs into the lungs every 6 (six) hours as needed for wheezing or shortness of breath. 05/27/18  Yes Demetrios Loll, MD  amLODipine (NORVASC) 2.5 MG tablet Take 2.5 mg by mouth daily.  04/26/18  Yes [provider]  furosemide (LASIX) 20 MG tablet Take 20 mg by mouth daily as needed for edema. 04/25/18  Yes [provider]  guaiFENesin-dextromethorphan (ROBITUSSIN DM) 100-10 MG/5ML syrup Take 5 mLs by mouth every 4 (four) hours as needed for cough. 05/27/18   Yes Demetrios Loll, MD  insulin aspart (NOVOLOG) 100 UNIT/ML injection Inject 20 Units into the skin 3 (three) times daily before meals.   Yes [provider]  ipratropium-albuterol (DUONEB) 0.5-2.5 (3) MG/3ML SOLN Take 3 mLs by nebulization every 6 (six) hours as needed. 05/28/18  Yes Mody, Sital, MD  LEVEMIR 100 UNIT/ML injection Inject 0.12 mLs (12 Units total) into the skin daily. Patient taking differently: Inject 40 Units into the skin at bedtime.  05/27/18  Yes Demetrios Loll, MD  nystatin cream (MYCOSTATIN) Apply topically 2 (two) times daily. 05/28/18  Yes Mody, Ulice Bold, MD  pravastatin (PRAVACHOL) 10 MG tablet Take 10 mg by mouth at bedtime. 04/25/18  Yes [provider]  tamsulosin (FLOMAX) 0.4 MG CAPS capsule Take 0.4 mg by mouth daily. 04/25/18  Yes [provider]      VITAL SIGNS:  Blood pressure (!) 148/51, pulse 85, temperature 97.6 F (36.4 C), temperature source Oral, resp. rate (!) 23, SpO2 98 %.  PHYSICAL EXAMINATION:   Physical Exam  GENERAL:  85 y.o.-year-old patient lying in the bed with no acute distress.  EYES: Pupils equal, round, reactive to light and accommodation. No scleral icterus. Extraocular muscles intact.  HEENT: Head atraumatic, normocephalic. Oropharynx and nasopharynx clear.  NECK:  Supple, no jugular venous distention. No thyroid enlargement, no tenderness.  LUNGS: Normal breath sounds bilaterally, no wheezing, rales,rhonchi or crepitation. No use of accessory muscles of respiration.  CARDIOVASCULAR: S1, S2 normal. No murmurs, rubs, or gallops.  ABDOMEN: Distended and tender to palpation. Bowel sounds present. No organomegaly or mass.  EXTREMITIES: Bilateral pitting edema of lower extremities with anterior erythema, tenderness present.  No cyanosis, or clubbing. No rash or lesions. + pedal pulses MUSCULOSKELETAL: Normal bulk, and power was 5+ grip and elbow, knee, and ankle flexion and extension bilaterally.  NEUROLOGIC:Alert and  oriented x 3. CN 2-12 intact. Sensation to light touch and cold stimuli intact bilaterally. Finger to nose nl. Babinski is downgoing. DTR's (biceps, patellar, and achilles) 2+ and symmetric throughout. Gait not tested due to safety concern. PSYCHIATRIC: The patient is alert and oriented x 3.  SKIN: As below   Left Leg    Right Leg  DATA REVIEWED:  LABORATORY PANEL:   CBC Recent Labs  Lab 06/09/18 1126  WBC 11.6*  HGB 3.6*  HCT 12.4*  PLT 272   ------------------------------------------------------------------------------------------------------------------  Chemistries  Recent Labs  Lab 06/09/18 1126  NA 137  K 4.5  CL 103  CO2 25  GLUCOSE 177*  BUN 72*  CREATININE 2.51*  CALCIUM 7.8*  AST 21  ALT 17  ALKPHOS 33*  BILITOT 0.7   ------------------------------------------------------------------------------------------------------------------  Cardiac Enzymes No results for input(s): TROPONINI in the last 168 hours. ------------------------------------------------------------------------------------------------------------------  RADIOLOGY:  Dg Chest Port 1 View  Result Date: 06/09/2018 CLINICAL DATA:  Fever EXAM: PORTABLE CHEST 1 VIEW COMPARISON:  May 23, 2008 FINDINGS: There is stable cardiac prominence with pulmonary vascularity normal. There is interstitial thickening without frank edema or consolidation. There is aortic atherosclerosis. No adenopathy. There is degenerative change in each shoulder. IMPRESSION: Stable cardiac prominence. Interstitial thickening which may reflect chronic inflammatory type change. No frank edema or consolidation evident. No adenopathy appreciable. Aortic  Atherosclerosis (ICD10-I70.0). Electronically Signed   By: Lowella Grip III M.D.   On: 06/09/2018 12:27    EKG:  EKG: normal EKG, normal sinus rhythm, RBBB. Junctional rhythm with a rate of 81 bpm, right bundle branch block, normal axis Vent. rate 81 BPM PR interval * ms  QRS duration 148 ms QT/QTc 396/460 ms P-R-T axes * 50 43  IMPRESSION AND PLAN:   85 y.o. male COPD, hypertension, diabetes mellitus, hyperlipidemia, anemia and chronic kidney disease, and chronic kidney disease stage III presenting to the ED with generalized weakness.  He is found to have hemoglobin of 3.6 on admission.  1. GI Bleed = Likely Lower as patient presenting with rectal bleed without evidence hemodynamic instability - Admit to MedSurg unit with telemetry monitoring - H&H monitoring q6h - Transfuse PRN Hgb<7 - Start pantoprazole 49m IV x1 then gtt 840mhr - NPO for pending endoscopy - We will obtain CT abdomen and pelvis without contrast - GI Consult for EGD/Colonoscopy - Helicobacter pylori Ab + stool Ag.  If either positive, will treat - Hold NSAIDs, steroids, ASA  2. Severe anemia -likely secondary to GI bleed as above - Hemoglobin 3.6 - Pending blood transfusion  3. DM: His sugars have not been well-controlled on current regimen  - Hold home meds - We will start sliding scale insulin coverage  - ADA 2100 calorie diet  - FS BS qac and qhs   4.  Acute on chronic kidney disease -BUN and creatinine elevated on admission - Continue to monitor renal function - Nephrology consult placed  5.  Bilateral lower extremity cellulitis - Previous work-up with ultrasound negative for DVT - Recently treated with broad-spectrum antibiotic coverage with cefepime and vancomycin - Pending blood cultures  6.  COPD= no evidence of exacerbation  - DuoNeb every 6 hours  - Incentive spirometry every hour while awake  - Patient has O2 per nasal cannula at 2 L/min  7. HLD = + Goal LDL<70 -Pravastatin 10 mg  8. HTN- slightly elevated + Goal BP <140/90 -Continue amlodipine    All the records are reviewed and case discussed with ED provider. Management plans discussed with the patient, family and they are in agreement.  CODE STATUS: DNR  TOTAL TIME TAKING CARE OF THIS  PATIENT: 45 minutes.    on 06/09/2018 at 2:00 PM  This patient was staffed with Dr. KaValetta [facility]Ma[facility] Incwho personally evaluated patient, reviewed documentation and agreed with assessment and plan of care as above.  ElRufina FalcoDNP, FNP-BC [facility] Nurse Practitioner Between 7am to 6pm - Pager - [phone] 6pm go to [email or web address] - password [email or web address] [facility]  Office  [phone]: Primary care physician; JoBaxter HireMD

## 2018-06-09 NOTE — ED Triage Notes (Signed)
Pt to ED by EMS with c/o of recent dx of blood infection. Pt recently hospitalized due to Sepsis. Pt states finished meds and not feeling better. Pt NAD at this time.

## 2018-06-09 NOTE — Consult Note (Signed)
Vonda Antigua, MD [address], [place], [place], [place], [address] [address], [place], [place], [place], [address] Phone: [phone]  Fax: [phone]  Consultation  Referring Provider:     Dr. Brett Albino Primary Care Physician:  Baxter Hire, MD Reason for Consultation:     Anemia  Date of Admission:  06/09/2018 Date of Consultation:  06/09/2018         HPI:   Arthur [name] is a 85 y.o. male who presents with 3-day history of melena at home.  Specifically reports stools are black and denies any red blood in stool.  No nausea or vomiting.  No hematemesis.  No abdominal pain.  Denies any NSAID use.  No previous history of GI bleed.  Has had 3 melanotic bowel movements in 3 days last one this morning.  No dysphagia.  Denies any previous upper endoscopy Reports having a colonoscopy years ago, unaware of the results or where it was done.  Past Medical History:  Diagnosis Date  . COPD (chronic obstructive pulmonary disease) ([place])   . Diabetes mellitus without complication ([place])   . Hyperlipidemia   . Hypertension     History reviewed. No pertinent surgical history.  Prior to Admission medications   Medication Sig Start Date End Date Taking? Authorizing Provider  albuterol (VENTOLIN HFA) 108 (90 Base) MCG/ACT inhaler Inhale 2 puffs into the lungs every 6 (six) hours as needed for wheezing or shortness of breath. 05/27/18  Yes Demetrios Loll, MD  amLODipine (NORVASC) 2.5 MG tablet Take 2.5 mg by mouth daily. 04/26/18  Yes [provider]  furosemide (LASIX) 20 MG tablet Take 20 mg by mouth daily as needed for edema. 04/25/18  Yes [provider]  guaiFENesin-dextromethorphan (ROBITUSSIN DM) 100-10 MG/5ML syrup Take 5 mLs by mouth every 4 (four) hours as needed for cough. 05/27/18  Yes Demetrios Loll, MD  insulin aspart (NOVOLOG) 100 UNIT/ML injection Inject 20 Units into the skin 3 (three) times daily before meals.   Yes [provider]   ipratropium-albuterol (DUONEB) 0.5-2.5 (3) MG/3ML SOLN Take 3 mLs by nebulization every 6 (six) hours as needed. 05/28/18  Yes Mody, Sital, MD  LEVEMIR 100 UNIT/ML injection Inject 0.12 mLs (12 Units total) into the skin daily. Patient taking differently: Inject 40 Units into the skin at bedtime.  05/27/18  Yes Demetrios Loll, MD  nystatin cream (MYCOSTATIN) Apply topically 2 (two) times daily. 05/28/18  Yes Mody, Ulice Bold, MD  pravastatin (PRAVACHOL) 10 MG tablet Take 10 mg by mouth at bedtime. 04/25/18  Yes [provider]  tamsulosin (FLOMAX) 0.4 MG CAPS capsule Take 0.4 mg by mouth daily. 04/25/18  Yes [provider]    History reviewed. No pertinent family history.   Social History   Tobacco Use  . Smoking status: Former Smoker    Last attempt to quit: 11/04/1982    Years since quitting: 35.6  . Smokeless tobacco: Current User  Substance Use Topics  . Alcohol use: Yes    Comment: 1/5 /wk  . Drug use: No    Allergies as of 06/09/2018 - Review Complete 06/09/2018  Allergen Reaction Noted  . Codeine Other (See Comments) 08/06/2013    Review of Systems:    All systems reviewed and negative except where noted in HPI.   Physical Exam:  Vital signs in last 24 hours: Vitals:   06/09/18 1457 06/09/18 1500 06/09/18 1515 06/09/18 1550  BP: (!) 138/48 (!) 121/58 (!) 106/57 (!) 145/41  Pulse:  87 89 88 92  Resp: (!) 22 20 (!) 25 17  Temp: 98.2 F (36.8 C)   98.6 F (37 C)  TempSrc: Oral   Oral  SpO2: 95% 96% 91% 100%  Weight:    115.7 kg  Height:    5\' 11"  (1.803 m)   Last BM Date: 06/09/18 General:   Pleasant, cooperative in NAD Head:  Normocephalic and atraumatic. Eyes:   No icterus.   Conjunctiva pink. PERRLA. Ears:  Normal auditory acuity. Neck:  Supple; no masses or thyroidomegaly Lungs: Respirations even and unlabored. Lungs clear to auscultation bilaterally.   No wheezes, crackles, or rhonchi.  Abdomen:  Soft, nondistended, nontender. Normal bowel sounds. No  appreciable masses or hepatomegaly.  No rebound or guarding.  Neurologic:  Alert and oriented x3;  grossly normal neurologically. Skin:  Intact without significant lesions or rashes. Cervical Nodes:  No significant cervical adenopathy. Psych:  Alert and cooperative. Normal affect.  LAB RESULTS: Recent Labs    06/09/18 1126  WBC 11.6*  HGB 3.6*  HCT 12.4*  PLT 272   BMET Recent Labs    06/09/18 1126  NA 137  K 4.5  CL 103  CO2 25  GLUCOSE 177*  BUN 72*  CREATININE 2.51*  CALCIUM 7.8*   LFT Recent Labs    06/09/18 1126  PROT 5.6*  ALBUMIN 2.9*  AST 21  ALT 17  ALKPHOS 33*  BILITOT 0.7   PT/INR No results for input(s): LABPROT, INR in the last 72 hours.  STUDIES: Ct Abdomen Pelvis Wo Contrast  Result Date: 06/09/2018 CLINICAL DATA:  Dark red blood per rectum with weakness and fever. EXAM: CT ABDOMEN AND PELVIS WITHOUT CONTRAST TECHNIQUE: Multidetector CT imaging of the abdomen and pelvis was performed following the standard protocol without IV contrast. COMPARISON:  None. FINDINGS: Lower chest: Small right pleural effusion with associated right basilar atelectasis. Mild calcified plaque over the left main, left anterior descending and right coronary arteries. Calcified plaque over the thoracic aorta. Hepatobiliary: Single 1.8 cm calcified gallstone. Liver and biliary tree are normal. Pancreas: Normal. Spleen: Normal. Adrenals/Urinary Tract: Adrenal glands are normal. Kidneys are normal in size without hydronephrosis or nephrolithiasis. Ureters and bladder are normal. Stomach/Bowel: Stomach and small bowel are normal. Appendix is normal. Colon is normal. Vascular/Lymphatic: Moderate calcified plaque over the abdominal aorta. No adenopathy. Reproductive: Normal. Other: No free fluid or focal inflammatory change. Small periumbilical hernia containing only peritoneal fat. Musculoskeletal: Degenerative change of the spine and hips. IMPRESSION: No acute findings in the  abdomen/pelvis. Small right pleural effusion with associated right basilar atelectasis. 1.8 cm single gallstone. Aortic Atherosclerosis (ICD10-I70.0). Atherosclerotic coronary artery disease. Small periumbilical hernia containing only peritoneal fat. Electronically Signed   By: Marin Olp M.D.   On: 06/09/2018 14:56   Dg Chest Port 1 View  Result Date: 06/09/2018 CLINICAL DATA:  Fever EXAM: PORTABLE CHEST 1 VIEW COMPARISON:  May 23, 2008 FINDINGS: There is stable cardiac prominence with pulmonary vascularity normal. There is interstitial thickening without frank edema or consolidation. There is aortic atherosclerosis. No adenopathy. There is degenerative change in each shoulder. IMPRESSION: Stable cardiac prominence. Interstitial thickening which may reflect chronic inflammatory type change. No frank edema or consolidation evident. No adenopathy appreciable. Aortic Atherosclerosis (ICD10-I70.0). Electronically Signed   By: Lowella Grip III M.D.   On: 06/09/2018 12:27      Impression / Plan:   Arthur [name] is a 85 y.o. y/o male with melanotic stool for 3 days with anemia  Signs and symptoms consistent with likely upper GI bleed  Patient will need medical optimization prior to endoscopy with replacement of hemoglobin, IV fluid resuscitation and correction of high lactate.  The above who ensure patient is able to safely undergo endoscopy with sedation  PPI IV twice daily  Continue serial CBCs and transfuse PRN Avoid NSAIDs Maintain 2 large-bore IV lines Please page GI with any acute hemodynamic changes, or signs of active GI bleeding  We will plan on upper endoscopy once patient is medically optimized  Dr. Vicente Males will be seeing the patient starting tomorrow and performing upper endoscopy tomorrow.  However, if clinical status changes with worsening anemia, active GI bleeding patient should be transferred to the ICU and GI on-call paged to assess for more urgent need of upper endoscopy.   Vitals are stable at this time, patient is hemodynamically stable, and therefore no indication for emergent EGD tonight, and medical optimization recommended at this time.  Thank you for involving me in the care of this patient.      LOS: 0 days   Virgel Manifold, MD  06/09/2018, 5:14 PM

## 2018-06-09 NOTE — ED Provider Notes (Signed)
[facility] Emergency Department Provider Note       Time seen: ----------------------------------------- 11:12 AM on 06/09/2018 -----------------------------------------   I have reviewed the triage vital signs and the nursing notes.  HISTORY   Chief Complaint No chief complaint on file.    HPI Arthur [name] is a 85 y.o. male with a history of COPD, diabetes, hyperlipidemia, hypertension, edema who presents to the ED for multiple complaints.  Patient has been confused and was recently treated for sepsis or cellulitis.  Patient still complaining of bilateral lower extremity edema that seems to be worsening.  Also complaining of weakness.  Past Medical History:  Diagnosis Date  . COPD (chronic obstructive pulmonary disease) ([place])   . Diabetes mellitus without complication ([place])   . Hyperlipidemia   . Hypertension     Patient Active Problem List   Diagnosis Date Noted  . Sepsis ([place]) 05/23/2018  . Cellulitis of right lower extremity 05/22/2018  . DIABETES MELLITUS 10/24/2006  . OBESITY 10/24/2006  . HYPERTENSION 10/24/2006  . ALLERGIC RHINITIS 10/24/2006  . CHRONIC OBSTRUCTIVE PULMONARY DISEASE, MODERATE 10/24/2006  . TOBACCO ABUSE, HX OF 10/24/2006    No past surgical history on file.  Allergies Codeine  Social History Social History   Tobacco Use  . Smoking status: Former Smoker    Last attempt to quit: 11/04/1982    Years since quitting: 35.6  . Smokeless tobacco: Current User  Substance Use Topics  . Alcohol use: Yes    Comment: 1/5 /wk  . Drug use: No   Review of Systems Constitutional: Negative for fever. Cardiovascular: Negative for chest pain. Respiratory: Negative for shortness of breath. Gastrointestinal: Negative for abdominal pain, vomiting and diarrhea. Musculoskeletal: Positive for leg swelling Skin: Negative for rash. Neurological: Negative for headaches, positive for generalized weakness  All systems  negative/normal/unremarkable except as stated in the HPI  ____________________________________________   PHYSICAL EXAM:  VITAL SIGNS: ED Triage Vitals  Enc Vitals Group     BP      Pulse      Resp      Temp      Temp src      SpO2      Weight      Height      Head Circumference      Peak Flow      Pain Score      Pain Loc      Pain Edu?      Excl. in [place]?    Constitutional: Alert and oriented.  Chronically ill-appearing, no distress Eyes: Conjunctivae are normal. Normal extraocular movements. ENT      Head: Normocephalic and atraumatic.      Nose: No congestion/rhinnorhea.      Mouth/Throat: Mucous membranes are moist.      Neck: No stridor. Cardiovascular: Normal rate, regular rhythm. No murmurs, rubs, or gallops. Respiratory: Normal respiratory effort without tachypnea nor retractions. Breath sounds are clear and equal bilaterally. No wheezes/rales/rhonchi. Gastrointestinal: Soft and nontender. Normal bowel sounds Musculoskeletal: Bilateral pitting edema is noted, some mild erythema on the distal pretibial area bilaterally Neurologic:  Normal speech and language. No gross focal neurologic deficits are appreciated.  Skin: Edema and erythema of the lower extremities as dictated above Psychiatric: Mood and affect are normal. Speech and behavior are normal.  ____________________________________________  EKG: Interpreted by me.  Junctional rhythm with a rate of 81 bpm, right bundle branch block, normal axis  ____________________________________________  ED COURSE:  As part of my  medical decision making, I reviewed the following data within the electronic MEDICAL RECORD NUMBER History obtained from family if available, nursing notes, old chart and ekg, as well as notes from prior ED visits. Patient presented for possible infection with swelling and weakness and previous cellulitis, we will assess with labs and imaging as indicated at this time.   Procedures  Arthur [name]  was evaluated in Emergency Department on 06/09/2018 for the symptoms described in the history of present illness. He was evaluated in the context of the global COVID-19 pandemic, which necessitated consideration that the patient might be at risk for infection with the SARS-CoV-2 virus that causes COVID-19. Institutional protocols and algorithms that pertain to the evaluation of patients at risk for COVID-19 are in a state of rapid change based on information released by regulatory bodies including the CDC and federal and state organizations. These policies and algorithms were followed during the patient's care in the ED.  ____________________________________________   LABS (pertinent positives/negatives)  Labs Reviewed  LACTIC ACID, PLASMA - Abnormal; Notable for the following components:      Result Value   Lactic Acid, Venous 2.4 (*)    All other components within normal limits  COMPREHENSIVE METABOLIC PANEL - Abnormal; Notable for the following components:   Glucose, Bld 177 (*)    BUN 72 (*)    Creatinine, Ser 2.51 (*)    Calcium 7.8 (*)    Total Protein 5.6 (*)    Albumin 2.9 (*)    Alkaline Phosphatase 33 (*)    GFR calc non Af Amer 23 (*)    GFR calc Af Amer 26 (*)    All other components within normal limits  CBC WITH DIFFERENTIAL/PLATELET - Abnormal; Notable for the following components:   WBC 11.6 (*)    RBC 1.24 (*)    Hemoglobin 3.6 (*)    HCT 12.4 (*)    MCHC 29.0 (*)    RDW 21.4 (*)    nRBC 2.6 (*)    Neutro Abs 8.2 (*)    Monocytes Absolute 1.3 (*)    Abs Immature Granulocytes 0.15 (*)    All other components within normal limits  BLOOD GAS, VENOUS - Abnormal; Notable for the following components:   Bicarbonate 28.2 (*)    All other components within normal limits  CULTURE, BLOOD (ROUTINE X 2)  CULTURE, BLOOD (ROUTINE X 2)  URINE CULTURE  LACTIC ACID, PLASMA  URINALYSIS, COMPLETE (UACMP) WITH MICROSCOPIC  CBC WITH DIFFERENTIAL/PLATELET  PREPARE RBC (CROSSMATCH)   ABO/RH   ____________________________________________   CRITICAL CARE Performed by: Laurence Aly   Total critical care time: 30 minutes  Critical care time was exclusive of separately billable procedures and treating other patients.  Critical care was necessary to treat or prevent imminent or life-threatening deterioration.  Critical care was time spent personally by me on the following activities: development of treatment plan with patient and/or surrogate as well as nursing, discussions with consultants, evaluation of patient's response to treatment, examination of patient, obtaining history from patient or surrogate, ordering and performing treatments and interventions, ordering and review of laboratory studies, ordering and review of radiographic studies, pulse oximetry and re-evaluation of patient's condition.   DIFFERENTIAL DIAGNOSIS   CHF, peripheral edema, dehydration, electrolyte abnormality, cellulitis  FINAL ASSESSMENT AND PLAN  Edema, gastrointestinal bleeding, chronic kidney disease, acute anemia   Plan: The patient had presented for progressive edema with weakness and recent cellulitis. Patient's labs revealed severe anemia with a hemoglobin  of 3.6.  I performed a rectal examination and found dark blood, obviously strongly heme positive.  This likely explains his symptoms.  I have ordered 2 units of blood for him.  He is remarkably stable at this time however.   Laurence Aly, MD    Note: This note was generated in part or whole with voice recognition software. Voice recognition is usually quite accurate but there are transcription errors that can and very often do occur. I apologize for any typographical errors that were not detected and corrected.     Earleen Newport, MD 06/09/18 1254

## 2018-06-09 NOTE — ED Notes (Signed)
ED TO INPATIENT HANDOFF REPORT  ED Nurse Name and Phone #:  [phone]  S Name/Age/Gender Arthur [name] 85 y.o. male Room/Bed: ED17A/ED17A  Code Status   Code Status: DNR  Home/SNF/Other Home Patient oriented to: self, place, time and situation Is this baseline? Yes   Triage Complete: Triage complete  Chief Complaint blood infection   Triage Note Pt to ED by EMS with c/o of recent dx of blood infection. Pt recently hospitalized due to Sepsis. Pt states finished meds and not feeling better. Pt NAD at this time.    Allergies Allergies  Allergen Reactions  . Codeine Other (See Comments)    Constipation    Level of Care/Admitting Diagnosis ED Disposition    ED Disposition Condition [facility]: [place] [[id number]]  Level of Care: Med-Surg [16]  Covid Evaluation: Confirmed COVID Negative  Diagnosis: GI bleeding [[id number]]  Admitting Physician: Rufina Falco [place]  Attending Physician: Hyman Bible DODD [[id number]]  Estimated length of stay: past midnight tomorrow  Certification:: I certify this patient will need inpatient services for at least 2 midnights  PT Class (Do Not Modify): Inpatient [101]  PT Acc Code (Do Not Modify): Private [1]       B Medical/Surgery History Past Medical History:  Diagnosis Date  . COPD (chronic obstructive pulmonary disease) ([place])   . Diabetes mellitus without complication ([place])   . Hyperlipidemia   . Hypertension    History reviewed. No pertinent surgical history.   A IV Location/Drains/Wounds Patient Lines/Drains/Airways Status   Active Line/Drains/Airways    None          Intake/Output Last 24 hours No intake or output data in the 24 hours ending 06/09/18 1429  Labs/Imaging Results for orders placed or performed during the hospital encounter of 06/09/18 (from the past 48 hour(s))  Blood gas, venous ([facility], [facility], [facility])     Status: Abnormal   Collection Time: 06/09/18 11:12 AM   Result Value Ref Range   pH, Ven 7.36 7.250 - 7.430   pCO2, Ven 50 44.0 - 60.0 mmHg   Bicarbonate 28.2 (H) 20.0 - 28.0 mmol/L   Acid-Base Excess 1.9 0.0 - 2.0 mmol/L   O2 Saturation 26.9 %   Patient temperature 37.0    Collection site VENOUS    Sample type VENOUS     Comment: Performed at [facility], [address] [place], [place] [address]  Lactic acid, plasma     Status: Abnormal   Collection Time: 06/09/18 11:26 AM  Result Value Ref Range   Lactic Acid, Venous 2.4 (HH) 0.5 - 1.9 mmol/L    Comment: CRITICAL RESULT CALLED TO, READ BACK BY AND VERIFIED WITH KIM Vasiliy Mccarry @1220  06/09/18 AKT Performed at [facility], [address] [place], [place] [address]   Comprehensive metabolic panel     Status: Abnormal   Collection Time: 06/09/18 11:26 AM  Result Value Ref Range   Sodium 137 135 - 145 mmol/L   Potassium 4.5 3.5 - 5.1 mmol/L   Chloride 103 98 - 111 mmol/L   CO2 25 22 - 32 mmol/L   Glucose, Bld 177 (H) 70 - 99 mg/dL   BUN 72 (H) 8 - 23 mg/dL   Creatinine, Ser 2.51 (H) 0.61 - 1.24 mg/dL   Calcium 7.8 (L) 8.9 - 10.3 mg/dL   Total Protein 5.6 (L) 6.5 - 8.1 g/dL   Albumin 2.9 (L) 3.5 - 5.0 g/dL   AST  21 15 - 41 U/L   ALT 17 0 - 44 U/L   Alkaline Phosphatase 33 (L) 38 - 126 U/L   Total Bilirubin 0.7 0.3 - 1.2 mg/dL   GFR calc non Af Amer 23 (L) >60 mL/min   GFR calc Af Amer 26 (L) >60 mL/min   Anion gap 9 5 - 15    Comment: Performed at [facility], [address] [place], [place] [address]  CBC WITH DIFFERENTIAL     Status: Abnormal   Collection Time: 06/09/18 11:26 AM  Result Value Ref Range   WBC 11.6 (H) 4.0 - 10.5 K/uL   RBC 1.24 (L) 4.22 - 5.81 MIL/uL   Hemoglobin 3.6 (LL) 13.0 - 17.0 g/dL    Comment: This critical result has verified and been called to [place] by Sable Feil on 06 07 2020 at 1232, and has been read back.    HCT 12.4 (LL) 39.0 - 52.0 %    Comment: This critical result has verified and been called to [place] by Sable Feil on 06 07 2020 at 1232, and has been read back.    MCV 100.0 80.0 - 100.0 fL   MCH 29.0 26.0 - 34.0 pg   MCHC 29.0 (L) 30.0 - 36.0 g/dL   RDW 21.4 (H) 11.5 - 15.5 %   Platelets 272 150 - 400 K/uL   nRBC 2.6 (H) 0.0 - 0.2 %   Neutrophils Relative % 71 %   Neutro Abs 8.2 (H) 1.7 - 7.7 K/uL   Lymphocytes Relative 17 %   Lymphs Abs 1.9 0.7 - 4.0 K/uL   Monocytes Relative 11 %   Monocytes Absolute 1.3 (H) 0.1 - 1.0 K/uL   Eosinophils Relative 0 %   Eosinophils Absolute 0.0 0.0 - 0.5 K/uL   Basophils Relative 0 %   Basophils Absolute 0.0 0.0 - 0.1 K/uL   WBC Morphology MORPHOLOGY UNREMARKABLE    RBC Morphology ANISO,POIK,HYPO,BASPHILLIC STIPPLING    Smear Review Normal platelet morphology    Immature Granulocytes 1 %   Abs Immature Granulocytes 0.15 (H) 0.00 - 0.07 K/uL    Comment: Performed at [facility], [address] [place], [place] [address]  ABO/Rh     Status: None   Collection Time: 06/09/18 11:26 AM  Result Value Ref Range   ABO/RH(D)      A POS Performed at [facility], [address] [place], [place] [address]   Urinalysis, Complete w Microscopic     Status: Abnormal   Collection Time: 06/09/18 12:55 PM  Result Value Ref Range   Color, Urine YELLOW (A) YELLOW   APPearance CLEAR (A) CLEAR   Specific Gravity, Urine 1.012 1.005 - 1.030   pH 5.0 5.0 - 8.0   Glucose, UA NEGATIVE NEGATIVE mg/dL   Hgb urine dipstick NEGATIVE NEGATIVE   Bilirubin Urine NEGATIVE NEGATIVE   Ketones, ur NEGATIVE NEGATIVE mg/dL   Protein, ur NEGATIVE NEGATIVE mg/dL   Nitrite NEGATIVE NEGATIVE   Leukocytes,Ua NEGATIVE NEGATIVE   RBC / HPF 0-5 0 - 5 RBC/hpf   WBC, UA 0-5 0 - 5 WBC/hpf   Bacteria, UA NONE SEEN NONE SEEN   Squamous Epithelial / LPF NONE SEEN 0 - 5   Mucus PRESENT     Comment: Performed at [facility], [address] [place], [place] [address]  Type and screen [place]     Status: None (Preliminary result)    Collection Time: 06/09/18  12:58 PM  Result Value Ref Range   ABO/RH(D) A POS    Antibody Screen NEG    Sample Expiration 06/12/2018,2359    Unit Number [id number]    Blood Component Type RED CELLS,LR    Unit division 00    Status of Unit ISSUED    Transfusion Status OK TO TRANSFUSE    Crossmatch Result      Compatible Performed at [facility], [address] [place], [place] [address]    Unit Number [id number]    Blood Component Type RED CELLS,LR    Unit division 00    Status of Unit ALLOCATED    Transfusion Status OK TO TRANSFUSE    Crossmatch Result Compatible   Prepare RBC     Status: None   Collection Time: 06/09/18  1:00 PM  Result Value Ref Range   Order Confirmation      NO CURRENT SAMPLE, MUST ORDER TYPE AND SCREEN ORDER PROCESSED BY BLOOD BANK Performed at [facility], [address] [place], [place] [address]    Dg Chest Port 1 View  Result Date: 06/09/2018 CLINICAL DATA:  Fever EXAM: PORTABLE CHEST 1 VIEW COMPARISON:  May 23, 2008 FINDINGS: There is stable cardiac prominence with pulmonary vascularity normal. There is interstitial thickening without frank edema or consolidation. There is aortic atherosclerosis. No adenopathy. There is degenerative change in each shoulder. IMPRESSION: Stable cardiac prominence. Interstitial thickening which may reflect chronic inflammatory type change. No frank edema or consolidation evident. No adenopathy appreciable. Aortic Atherosclerosis (ICD10-I70.0). Electronically Signed   By: Lowella Grip III M.D.   On: 06/09/2018 12:27    Pending Labs Unresulted Labs (From admission, onward)    Start     Ordered   06/09/18 1309  SARS Coronavirus 2 (CEPHEID - Performed in [place] hospital lab), Hosp Order  (Asymptomatic Patients Labs)  Once,   STAT    Question:  Rule Out  Answer:  Yes   06/09/18 1308   06/09/18 1112  Lactic acid, plasma  STAT Now then every 3 hours,   STAT     06/09/18 1112   06/09/18  1112  Blood Culture (routine x 2)  BLOOD CULTURE X 2,   STAT     06/09/18 1112   06/09/18 1112  Urine culture  ONCE - STAT,   STAT     06/09/18 1112          Vitals/Pain Today's Vitals   06/09/18 1200 06/09/18 1215 06/09/18 1230 06/09/18 1301  BP: (!) 152/49 (!) 151/49 (!) 144/49 (!) 148/51  Pulse: 85 85 85 85  Resp: (!) 25 (!) 23 19 (!) 23  Temp:      TempSrc:      SpO2: 99% 100% 99% 98%  PainSc:        Isolation Precautions No active isolations  Medications Medications  0.9 %  sodium chloride infusion (has no administration in time range)  pravastatin (PRAVACHOL) tablet 10 mg (has no administration in time range)  furosemide (LASIX) tablet 20 mg (has no administration in time range)  tamsulosin (FLOMAX) capsule 0.4 mg (has no administration in time range)  ipratropium-albuterol (DUONEB) 0.5-2.5 (3) MG/3ML nebulizer solution 3 mL (has no administration in time range)  nystatin cream (MYCOSTATIN) (has no administration in time range)  amLODipine (NORVASC) tablet 2.5 mg (has no administration in time range)  pantoprazole (PROTONIX) 80 mg in sodium chloride 0.9 % 250 mL (0.32 mg/mL) infusion (has no administration in time range)  pantoprazole (  PROTONIX) injection 40 mg (40 mg Intravenous Given 06/09/18 1307)    Mobility non-ambulatory Low fall risk   Focused Assessments    R Recommendations: See Admitting Provider Note  Report given to:   Additional Notes:

## 2018-06-10 ENCOUNTER — Inpatient Hospital Stay: Payer: Medicare HMO | Admitting: Anesthesiology

## 2018-06-10 ENCOUNTER — Encounter
Admission: EM | Disposition: A | Discharge status: Home Health | Payer: Self-pay | Source: Home / Self Care | Attending: Internal Medicine

## 2018-06-10 HISTORY — PX: ESOPHAGOGASTRODUODENOSCOPY: SHX5428

## 2018-06-10 LAB — GLUCOSE, CAPILLARY
Glucose-Capillary: 176 mg/dL — ABNORMAL HIGH (ref 70–99)
Glucose-Capillary: 178 mg/dL — ABNORMAL HIGH (ref 70–99)
Glucose-Capillary: 223 mg/dL — ABNORMAL HIGH (ref 70–99)
Glucose-Capillary: 202 mg/dL — ABNORMAL HIGH (ref 70–99)

## 2018-06-10 LAB — CBC
HCT: 17.5 % — ABNORMAL LOW (ref 39.0–52.0)
Hemoglobin: 5.6 g/dL — ABNORMAL LOW (ref 13.0–17.0)
MCH: 29.8 pg (ref 26.0–34.0)
MCHC: 32 g/dL (ref 30.0–36.0)
MCV: 93.1 fL (ref 80.0–100.0)
Platelets: 221 10*3/uL (ref 150–400)
RBC: 1.88 MIL/uL — ABNORMAL LOW (ref 4.22–5.81)
RDW: 19.9 % — ABNORMAL HIGH (ref 11.5–15.5)
WBC: 11.7 10*3/uL — ABNORMAL HIGH (ref 4.0–10.5)
nRBC: 2.8 % — ABNORMAL HIGH (ref 0.0–0.2)

## 2018-06-10 LAB — POTASSIUM: Potassium: 5.2 mmol/L — ABNORMAL HIGH (ref 3.5–5.1)

## 2018-06-10 LAB — BASIC METABOLIC PANEL
Anion gap: 5 (ref 5–15)
BUN: 71 mg/dL — ABNORMAL HIGH (ref 8–23)
CO2: 27 mmol/L (ref 22–32)
Calcium: 7.7 mg/dL — ABNORMAL LOW (ref 8.9–10.3)
Chloride: 105 mmol/L (ref 98–111)
Creatinine, Ser: 2.62 mg/dL — ABNORMAL HIGH (ref 0.61–1.24)
GFR calc Af Amer: 25 mL/min — ABNORMAL LOW (ref >60)
GFR calc non Af Amer: 22 mL/min — ABNORMAL LOW (ref >60)
Glucose, Bld: 201 mg/dL — ABNORMAL HIGH (ref 70–99)
Potassium: 5.4 mmol/L — ABNORMAL HIGH (ref 3.5–5.1)
Sodium: 137 mmol/L (ref 135–145)

## 2018-06-10 LAB — PROTIME-INR
INR: 1.2 (ref 0.8–1.2)
Prothrombin Time: 15.3 seconds — ABNORMAL HIGH (ref 11.4–15.2)

## 2018-06-10 LAB — HEMOGLOBIN AND HEMATOCRIT, BLOOD
HCT: 18.7 % — ABNORMAL LOW (ref 39.0–52.0)
Hemoglobin: 6 g/dL — ABNORMAL LOW (ref 13.0–17.0)

## 2018-06-10 LAB — HEMOGLOBIN: Hemoglobin: 7 g/dL — ABNORMAL LOW (ref 13.0–17.0)

## 2018-06-10 LAB — PREPARE RBC (CROSSMATCH)

## 2018-06-10 SURGERY — EGD (ESOPHAGOGASTRODUODENOSCOPY)
Anesthesia: General | Laterality: Left

## 2018-06-10 MED ORDER — SODIUM CHLORIDE 0.9 % IV SOLN
INTRAVENOUS | Status: DC
  Administered 2018-06-11: 14:00:00 via INTRAVENOUS
  Administered 2018-06-13: 1000 mL via INTRAVENOUS

## 2018-06-10 MED ORDER — PROPOFOL 500 MG/50ML IV EMUL
INTRAVENOUS | Status: DC | PRN
  Administered 2018-06-10: 100 ug/kg/min via INTRAVENOUS

## 2018-06-10 MED ORDER — PROPOFOL 10 MG/ML IV BOLUS
INTRAVENOUS | Status: DC | PRN
  Administered 2018-06-10: 50 mg via INTRAVENOUS

## 2018-06-10 MED ORDER — FUROSEMIDE 10 MG/ML IJ SOLN
20.0000 mg | Freq: Once | INTRAMUSCULAR | Status: AC
  Administered 2018-06-10: 20 mg via INTRAVENOUS
  Filled 2018-06-10: qty 4

## 2018-06-10 MED ORDER — PATIROMER SORBITEX CALCIUM 8.4 G PO PACK
8.4000 g | PACK | Freq: Every day | ORAL | Status: DC
  Administered 2018-06-10: 8.4 g via ORAL
  Filled 2018-06-10 (×3): qty 1

## 2018-06-10 MED ORDER — EPINEPHRINE 1 MG/10ML IJ SOSY
PREFILLED_SYRINGE | INTRAMUSCULAR | Status: AC
  Filled 2018-06-10: qty 10

## 2018-06-10 MED ORDER — PEG 3350-KCL-NA BICARB-NACL 420 G PO SOLR
4000.0000 mL | Freq: Once | ORAL | Status: AC
  Administered 2018-06-10: 4000 mL via ORAL
  Filled 2018-06-10: qty 4000

## 2018-06-10 MED ORDER — SODIUM CHLORIDE 0.9% IV SOLUTION
Freq: Once | INTRAVENOUS | Status: AC
  Administered 2018-06-10: 03:00:00 via INTRAVENOUS

## 2018-06-10 MED ORDER — LIDOCAINE 2% (20 MG/ML) 5 ML SYRINGE
INTRAMUSCULAR | Status: DC | PRN
  Administered 2018-06-10: 25 mg via INTRAVENOUS

## 2018-06-10 MED ORDER — SODIUM CHLORIDE 0.9 % IV SOLN
INTRAVENOUS | Status: DC
  Administered 2018-06-10: 11:00:00 via INTRAVENOUS

## 2018-06-10 MED ORDER — FUROSEMIDE 10 MG/ML IJ SOLN
40.0000 mg | Freq: Once | INTRAMUSCULAR | Status: AC
  Administered 2018-06-10: 40 mg via INTRAVENOUS
  Filled 2018-06-10: qty 4

## 2018-06-10 NOTE — Transfer of Care (Signed)
Immediate Anesthesia Transfer of Care Note  Patient: Arthur [name]  Procedure(s) Performed: ESOPHAGOGASTRODUODENOSCOPY (EGD) (Left )  Patient Location: Endoscopy Unit  Anesthesia Type:General  Level of Consciousness: awake  Airway & Oxygen Therapy: Patient Spontanous Breathing and Patient connected to nasal cannula oxygen  Post-op Assessment: Report given to RN and Post -op Vital signs reviewed and stable  Post vital signs: Reviewed  Last Vitals:  Vitals Value Taken Time  BP 151/54 06/10/2018 11:13 AM  Temp 36.4 C 06/10/2018 11:13 AM  Pulse 74 06/10/2018 11:13 AM  Resp 29 06/10/2018 11:13 AM  SpO2 99 % 06/10/2018 11:13 AM  Vitals shown include unvalidated device data.  Last Pain:  Vitals:   06/10/18 0907  TempSrc: Tympanic  PainSc:          Complications: No apparent anesthesia complications

## 2018-06-10 NOTE — Anesthesia Post-op Follow-up Note (Signed)
Anesthesia QCDR form completed.        

## 2018-06-10 NOTE — Anesthesia Preprocedure Evaluation (Addendum)
Anesthesia Evaluation  Patient identified by MRN, date of birth, ID band Patient awake    Reviewed: Allergy & Precautions, H&P , NPO status , Patient's Chart, lab work & pertinent test results  Airway Mallampati: III  TM Distance: >3 FB    Comment: Large neck Dental  (+) Edentulous Upper, Edentulous Lower   Pulmonary COPD (2L nearly ATC),  oxygen dependent, former smoker,           Cardiovascular hypertension,      Neuro/Psych negative neurological ROS  negative psych ROS   GI/Hepatic negative GI ROS, Neg liver ROS,   Endo/Other  diabetes  Renal/GU CRFRenal disease  negative genitourinary   Musculoskeletal   Abdominal   Peds  Hematology  (+) Blood dyscrasia, anemia , Hgb 5.6 this AM, s/p 1 unit pRBC with Hbg 7.0, 2nd unit pRBC running   Anesthesia Other Findings Obesity  Past Medical History: No date: COPD (chronic obstructive pulmonary disease) ([facility]) No date: Diabetes mellitus without complication ([facility]) No date: Hyperlipidemia No date: Hypertension  History reviewed. No pertinent surgical history.  BMI    Body Mass Index:  35.57 kg/m      Reproductive/Obstetrics negative OB ROS                          Anesthesia Physical Anesthesia Plan  ASA: III  Anesthesia Plan: General   Post-op Pain Management:    Induction:   PONV Risk Score and Plan: Propofol infusion and TIVA  Airway Management Planned: Natural Airway and Nasal Cannula  Additional Equipment:   Intra-op Plan:   Post-operative Plan:   Informed Consent: I have reviewed the patients History and Physical, chart, labs and discussed the procedure including the risks, benefits and alternatives for the proposed anesthesia with the patient or authorized representative who has indicated his/her understanding and acceptance.     Dental Advisory Given  Plan Discussed with: Anesthesiologist and CRNA  Anesthesia Plan  Comments:         Anesthesia Quick Evaluation

## 2018-06-10 NOTE — Progress Notes (Signed)
Dr. Gardiner Barefoot notified of HGB 6.0 and HCT 18.7. 2 more units of blood ordered.  Also notifed patient is sounding a bit overloaded. She ordered for him to have 20 mg IV lasix between units.  Notified her also of K of 5.4.  Will continue to monitor patient.

## 2018-06-10 NOTE — H&P (Signed)
Arthur Bellows, MD [address] [place], [place], [place], [address] [address], [place], [place], [place], [address] Phone: [phone]  Fax: [phone]  Primary Care Physician:  Baxter Hire, MD   Pre-Procedure History & Physical: HPI:  Arthur [name] is a 85 y.o. male is here for an endoscopy    Past Medical History:  Diagnosis Date  . COPD (chronic obstructive pulmonary disease) ([place])   . Diabetes mellitus without complication ([place])   . Hyperlipidemia   . Hypertension     History reviewed. No pertinent surgical history.  Prior to Admission medications   Medication Sig Start Date End Date Taking? Authorizing Provider  albuterol (VENTOLIN HFA) 108 (90 Base) MCG/ACT inhaler Inhale 2 puffs into the lungs every 6 (six) hours as needed for wheezing or shortness of breath. 05/27/18  Yes Demetrios Loll, MD  amLODipine (NORVASC) 2.5 MG tablet Take 2.5 mg by mouth daily. 04/26/18  Yes [provider]  furosemide (LASIX) 20 MG tablet Take 20 mg by mouth daily as needed for edema. 04/25/18  Yes [provider]  guaiFENesin-dextromethorphan (ROBITUSSIN DM) 100-10 MG/5ML syrup Take 5 mLs by mouth every 4 (four) hours as needed for cough. 05/27/18  Yes Demetrios Loll, MD  insulin aspart (NOVOLOG) 100 UNIT/ML injection Inject 20 Units into the skin 3 (three) times daily before meals.   Yes [provider]  ipratropium-albuterol (DUONEB) 0.5-2.5 (3) MG/3ML SOLN Take 3 mLs by nebulization every 6 (six) hours as needed. 05/28/18  Yes Mody, Sital, MD  LEVEMIR 100 UNIT/ML injection Inject 0.12 mLs (12 Units total) into the skin daily. Patient taking differently: Inject 40 Units into the skin at bedtime.  05/27/18  Yes Demetrios Loll, MD  nystatin cream (MYCOSTATIN) Apply topically 2 (two) times daily. 05/28/18  Yes Mody, Ulice Bold, MD  pravastatin (PRAVACHOL) 10 MG tablet Take 10 mg by mouth at bedtime. 04/25/18  Yes [provider]  tamsulosin (FLOMAX) 0.4 MG CAPS  capsule Take 0.4 mg by mouth daily. 04/25/18  Yes [provider]    Allergies as of 06/09/2018 - Review Complete 06/09/2018  Allergen Reaction Noted  . Codeine Other (See Comments) 08/06/2013    History reviewed. No pertinent family history.  Social History   Socioeconomic History  . Marital status: Widowed    Spouse name: Not on file  . Number of children: Not on file  . Years of education: Not on file  . Highest education level: Not on file  Occupational History  . Not on file  Social Needs  . Financial resource strain: Not on file  . Food insecurity:    Worry: Not on file    Inability: Not on file  . Transportation needs:    Medical: Not on file    Non-medical: Not on file  Tobacco Use  . Smoking status: Former Smoker    Last attempt to quit: 11/04/1982    Years since quitting: 35.6  . Smokeless tobacco: Current User  Substance and Sexual Activity  . Alcohol use: Yes    Comment: 1/5 /wk  . Drug use: No  . Sexual activity: Not on file  Lifestyle  . Physical activity:    Days per week: Not on file    Minutes per session: Not on file  . Stress: Not on file  Relationships  . Social connections:    Talks on phone: Not on file    Gets together: Not on file    Attends religious service:  Not on file    Active member of club or organization: Not on file    Attends meetings of clubs or organizations: Not on file    Relationship status: Not on file  . Intimate partner violence:    Fear of current or ex partner: Not on file    Emotionally abused: Not on file    Physically abused: Not on file    Forced sexual activity: Not on file  Other Topics Concern  . Not on file  Social History Narrative  . Not on file    Review of Systems: See HPI, otherwise negative ROS  Physical Exam: BP (!) 142/55   Pulse 65   Temp 98.9 F (37.2 C) (Tympanic)   Resp 20   Ht 5\' 11"  (1.803 m)   Wt 115.7 kg   SpO2 100%   BMI 35.57 kg/m  General:   Alert,  pleasant and  cooperative in NAD Head:  Normocephalic and atraumatic. Neck:  Supple; no masses or thyromegaly. Lungs:  Clear throughout to auscultation, normal respiratory effort.    Heart:  +S1, +S2, Regular rate and rhythm, No edema. Abdomen:  Soft, nontender and nondistended. Normal bowel sounds, without guarding, and without rebound.   Neurologic:  Alert and  oriented x4;  grossly normal neurologically.  Impression/Plan: Arthur [name] is here for an endoscopy  to be performed for  evaluation of gi bleed    Risks, benefits, limitations, and alternatives regarding endoscopy have been reviewed with the patient.  Questions have been answered.  All parties agreeable.   Arthur Bellows, MD  06/10/2018, 9:42 AM

## 2018-06-10 NOTE — Progress Notes (Signed)
[facility] NAME: [facility] [name]    MR#:  [id number]  DATE OF BIRTH:  May 22, 1933  SUBJECTIVE:   Chief Complaint  Patient presents with  . Blood Infection   Patient is seen at the bedside, he reports feeling better today but still with SOB requiring supplemental oxygen. No new episode of melena reported.  S/p EGD today  REVIEW OF SYSTEMS:  Review of Systems  Constitutional: Negative for chills, fever, malaise/fatigue and weight loss.  HENT: Negative for congestion, hearing loss and sore throat.   Eyes: Negative for blurred vision and double vision.  Respiratory: Positive for shortness of breath. Negative for cough and wheezing.   Cardiovascular: Positive for orthopnea and leg swelling. Negative for chest pain and palpitations.  Gastrointestinal: Positive for melena. Negative for abdominal pain, diarrhea, nausea and vomiting.  Genitourinary: Negative for dysuria and urgency.  Musculoskeletal: Negative for myalgias.  Skin: Negative for rash.  Neurological: Negative for dizziness, sensory change, speech change, focal weakness and headaches.  Psychiatric/Behavioral: Negative for depression.   DRUG ALLERGIES:   Allergies  Allergen Reactions  . Codeine Other (See Comments)    Constipation   VITALS:  Blood pressure (!) 147/62, pulse 62, temperature 98.2 F (36.8 C), temperature source Oral, resp. rate 20, height 5\' 11"  (1.803 m), weight 115.7 kg, SpO2 100 %. PHYSICAL EXAMINATION:   GENERAL:  85 y.o.-year-old patient lying in the bed with no acute distress.  EYES: Pupils equal, round, reactive to light and accommodation. No scleral icterus. Extraocular muscles intact.  HEENT: Head atraumatic, normocephalic. Oropharynx and nasopharynx clear.  NECK:  Supple, no jugular venous distention. No thyroid enlargement, no tenderness.  LUNGS: Normal breath sounds bilaterally, no wheezing, rales,rhonchi or crepitation. No use of accessory  muscles of respiration.  CARDIOVASCULAR: S1, S2 normal. No murmurs, rubs, or gallops.  ABDOMEN: Distended and non-tender. Bowel sounds present. No organomegaly or mass.  EXTREMITIES: Bilateral pitting edema of lower extremities with anterior erythema, tenderness present.  No cyanosis, or clubbing. No rash or lesions. + pedal pulses MUSCULOSKELETAL: Normal bulk, and power was 5+ grip and elbow, knee, and ankle flexion and extension bilaterally.  NEUROLOGIC:Alert and oriented x 3. CN 2-12 intact. Sensation to light touch and cold stimuli intact bilaterally. Finger to nose nl. Babinski is downgoing. DTR's (biceps, patellar, and achilles) 2+ and symmetric throughout. Gait not tested due to safety concern. PSYCHIATRIC: The patient is alert and oriented x 3.  SKIN: As below   Left Leg    Right Leg  DATA REVIEWED:  LABORATORY PANEL:  Male CBC Recent Labs  Lab 06/10/18 0213 06/10/18 1006  WBC 11.7*  --   HGB 5.6* 7.0*  HCT 17.5*  --   PLT 221  --    ------------------------------------------------------------------------------------------------------------------ Chemistries  Recent Labs  Lab 06/09/18 1126 06/10/18 0213 06/10/18 0732  NA 137 137  --   K 4.5 5.4* 5.2*  CL 103 105  --   CO2 25 27  --   GLUCOSE 177* 201*  --   BUN 72* 71*  --   CREATININE 2.51* 2.62*  --   CALCIUM 7.8* 7.7*  --   AST 21  --   --   ALT 17  --   --   ALKPHOS 33*  --   --   BILITOT 0.7  --   --    RADIOLOGY:  Ct Abdomen Pelvis Wo Contrast  Result Date: 06/09/2018 CLINICAL DATA:  Dark red blood  per rectum with weakness and fever. EXAM: CT ABDOMEN AND PELVIS WITHOUT CONTRAST TECHNIQUE: Multidetector CT imaging of the abdomen and pelvis was performed following the standard protocol without IV contrast. COMPARISON:  None. FINDINGS: Lower chest: Small right pleural effusion with associated right basilar atelectasis. Mild calcified plaque over the left main, left anterior descending and right  coronary arteries. Calcified plaque over the thoracic aorta. Hepatobiliary: Single 1.8 cm calcified gallstone. Liver and biliary tree are normal. Pancreas: Normal. Spleen: Normal. Adrenals/Urinary Tract: Adrenal glands are normal. Kidneys are normal in size without hydronephrosis or nephrolithiasis. Ureters and bladder are normal. Stomach/Bowel: Stomach and small bowel are normal. Appendix is normal. Colon is normal. Vascular/Lymphatic: Moderate calcified plaque over the abdominal aorta. No adenopathy. Reproductive: Normal. Other: No free fluid or focal inflammatory change. Small periumbilical hernia containing only peritoneal fat. Musculoskeletal: Degenerative change of the spine and hips. IMPRESSION: No acute findings in the abdomen/pelvis. Small right pleural effusion with associated right basilar atelectasis. 1.8 cm single gallstone. Aortic Atherosclerosis (ICD10-I70.0). Atherosclerotic coronary artery disease. Small periumbilical hernia containing only peritoneal fat. Electronically Signed   By: Marin Olp M.D.   On: 06/09/2018 14:56   Dg Chest Port 1 View  Result Date: 06/09/2018 CLINICAL DATA:  Fever EXAM: PORTABLE CHEST 1 VIEW COMPARISON:  May 23, 2008 FINDINGS: There is stable cardiac prominence with pulmonary vascularity normal. There is interstitial thickening without frank edema or consolidation. There is aortic atherosclerosis. No adenopathy. There is degenerative change in each shoulder. IMPRESSION: Stable cardiac prominence. Interstitial thickening which may reflect chronic inflammatory type change. No frank edema or consolidation evident. No adenopathy appreciable. Aortic Atherosclerosis (ICD10-I70.0). Electronically Signed   By: Lowella Grip III M.D.   On: 06/09/2018 12:27   ASSESSMENT AND PLAN:   85 y.o. male 85 y.o. male COPD, hypertension, diabetes mellitus, hyperlipidemia, anemia and chronic kidney disease, and chronic kidney disease stage III presenting to the ED with generalized  weakness.  He is found to have hemoglobin of 3.6 on admission.  1. GI Bleed = Likely Lower as patient presenting with rectal bleed without evidence hemodynamic instability - CT abdomen and pelvis shows 1.8 cm gallstone otherwise no acute finding - s/p EGD 6/8 with no finding for source of bleed - Plan for colonoscopy tomorrow - Continue pantoprazole gtt 8mg /hr - Hold NSAIDs, steroids, ASA - GI input appreciated , will continue to follow with them  2. Severe anemia (Hgb 3.6 on admission) -likely secondary to GI bleed as above s/p PRBC for total of 4units - Continue to monitor H&H - Transfuse PRN Hgb<7  3. DM: His sugars have not been well-controlled on current regimen  - Hold home meds - Continue sliding scale insulin coverage  - ADA 2100 calorie diet  - FS BS qac and qhs   4.  Acute on chronic kidney disease -BUN and creatinine elevated on admission - Continue to monitor renal function - Nephrology consult   5.  Bilateral lower extremity cellulitis with group B strep bacteremia - Previous work-up with ultrasound negative for DVT - Recently treated with broad-spectrum antibiotic coverage with cefepime and vancomycin - blood cultures shows no growth so far - Continue amoxicillin  6.  COPD= no evidence of exacerbation  - DuoNeb every 6 hours  - Incentive spirometry every hour while awake  - Patient has O2 per nasal cannula at 2 L/min  7. HLD  + Goal LDL<70 -Pravastatin 10 mg  8. HTN- slightly elevated + Goal BP <140/90 - Continue amlodipine  All the records are reviewed and case discussed with Care Management/Social Worker. Management plans discussed with the patient, family and they are in agreement.  CODE STATUS: DNR  TOTAL TIME TAKING CARE OF THIS PATIENT: 35 minutes.   More than 50% of the time was spent in counseling/coordination of care: YES  POSSIBLE D/C IN 1-2 DAYS, DEPENDING ON CLINICAL CONDITION.   on 06/10/2018 at 1:00 PM  This patient was  staffed with Dr. Valetta Fuller, [facility]  who personally evaluated patient, reviewed documentation and agreed with assessment and plan of care as above.  Rufina Falco, DNP, FNP-BC [facility] Nurse Practitioner   Between 7am to 6pm - Pager [phone]  After 6pm go to [email or web address] - Proofreader  [facility]  Office  [phone]  CC: Primary care physician; Baxter Hire, MD  Note: This dictation was prepared with Dragon dictation along with smaller phrase technology. Any transcriptional errors that result from this process are unintentional.

## 2018-06-10 NOTE — Op Note (Signed)
[facility] Gastroenterology Patient Name: Arthur [name] Procedure Date: 06/10/2018 11:00 AM MRN: [id number] Account #: [id number] Date of Birth: Aug 26, 1933 Admit Type: Inpatient Age: 85 Room: [facility] ENDO ROOM 4 Gender: Male Note Status: Finalized Procedure:            Upper GI endoscopy Indications:          Occult blood in stool Providers:            Jonathon Bellows MD, MD Referring MD:         Baxter Hire, MD (Referring MD) Medicines:            Monitored Anesthesia Care Complications:        No immediate complications. Procedure:            Pre-Anesthesia Assessment:                       - Prior to the procedure, a History and Physical was                        performed, and patient medications, allergies and                        sensitivities were reviewed. The patient's tolerance of                        previous anesthesia was reviewed.                       - The risks and benefits of the procedure and the                        sedation options and risks were discussed with the                        patient. All questions were answered and informed                        consent was obtained.                       - ASA Grade Assessment: III - A patient with severe                        systemic disease.                       After obtaining informed consent, the endoscope was                        passed under direct vision. Throughout the procedure,                        the patient's blood pressure, pulse, and oxygen                        saturations were monitored continuously. The Endoscope                        was introduced through the mouth, and advanced to the  third part of duodenum. The upper GI endoscopy was                        accomplished with ease. The patient tolerated the                        procedure well. Findings:      The examined duodenum was normal.      The stomach was normal.      The cardia  and gastric fundus were normal on retroflexion.      There were esophageal mucosal changes suspicious for short-segment       Barrett's esophagus present in the lower third of the esophagus. The       maximum longitudinal extent of these mucosal changes was 2 cm in length.       no biopsies taken , as he presented with GI bleed Impression:           - Normal examined duodenum.                       - Normal stomach.                       - Esophageal mucosal changes suspicious for                        short-segment Barrett's esophagus.                       - No specimens collected. Recommendation:       - Return patient to hospital ward for ongoing care.                       - Clear liquid diet today.                       - Perform a colonoscopy tomorrow. Procedure Code(s):    --- Professional ---                       [phone], Esophagogastroduodenoscopy, flexible, transoral;                        diagnostic, including collection of specimen(s) by                        brushing or washing, when performed (separate procedure) Diagnosis Code(s):    --- Professional ---                       K22.8, Other specified diseases of esophagus                       R19.5, Other fecal abnormalities CPT copyright 2019 American Medical Association. All rights reserved. The codes documented in this report are preliminary and upon coder review may  be revised to meet current compliance requirements. Jonathon Bellows, MD Jonathon Bellows MD, MD 06/10/2018 11:08:28 AM This report has been signed electronically. Number of Addenda: 0 Note Initiated On: 06/10/2018 11:00 AM      [facility]

## 2018-06-10 NOTE — Anesthesia Postprocedure Evaluation (Signed)
Anesthesia Post Note  Patient: Arthur [name]  Procedure(s) Performed: ESOPHAGOGASTRODUODENOSCOPY (EGD) (Left )  Patient location during evaluation: PACU Anesthesia Type: General Level of consciousness: awake and alert Pain management: pain level controlled Vital Signs Assessment: post-procedure vital signs reviewed and stable Respiratory status: spontaneous breathing, nonlabored ventilation, respiratory function stable and patient connected to nasal cannula oxygen Cardiovascular status: blood pressure returned to baseline and stable Postop Assessment: no apparent nausea or vomiting Anesthetic complications: no     Last Vitals:  Vitals:   06/10/18 1140 06/10/18 1202  BP: (!) 149/59 (!) 147/62  Pulse: 68 62  Resp: (!) 25 20  Temp:  36.8 C  SpO2: 100% 100%    Last Pain:  Vitals:   06/10/18 1202  TempSrc: Oral  PainSc:                  Durenda Hurt

## 2018-06-11 ENCOUNTER — Inpatient Hospital Stay: Payer: Medicare HMO | Admitting: Oncology

## 2018-06-11 ENCOUNTER — Inpatient Hospital Stay: Payer: Medicare HMO | Admitting: Anesthesiology

## 2018-06-11 ENCOUNTER — Encounter: Payer: Self-pay | Admitting: Gastroenterology

## 2018-06-11 ENCOUNTER — Encounter
Admission: EM | Disposition: A | Discharge status: Home Health | Payer: Self-pay | Source: Home / Self Care | Attending: Internal Medicine

## 2018-06-11 HISTORY — PX: COLONOSCOPY WITH PROPOFOL: SHX5780

## 2018-06-11 LAB — CBC WITH DIFFERENTIAL/PLATELET
Abs Immature Granulocytes: 0.12 10*3/uL — ABNORMAL HIGH (ref 0.00–0.07)
Basophils Absolute: 0.1 10*3/uL (ref 0.0–0.1)
Basophils Relative: 1 %
Eosinophils Absolute: 0.1 10*3/uL (ref 0.0–0.5)
Eosinophils Relative: 1 %
HCT: 23.2 % — ABNORMAL LOW (ref 39.0–52.0)
Hemoglobin: 7.3 g/dL — ABNORMAL LOW (ref 13.0–17.0)
Immature Granulocytes: 1 %
Lymphocytes Relative: 18 %
Lymphs Abs: 1.6 10*3/uL (ref 0.7–4.0)
MCH: 28.6 pg (ref 26.0–34.0)
MCHC: 31.5 g/dL (ref 30.0–36.0)
MCV: 91 fL (ref 80.0–100.0)
Monocytes Absolute: 0.9 10*3/uL (ref 0.1–1.0)
Monocytes Relative: 10 %
Neutro Abs: 6.4 10*3/uL (ref 1.7–7.7)
Neutrophils Relative %: 69 %
Platelets: 193 10*3/uL (ref 150–400)
RBC: 2.55 MIL/uL — ABNORMAL LOW (ref 4.22–5.81)
RDW: 20.5 % — ABNORMAL HIGH (ref 11.5–15.5)
WBC: 9.3 10*3/uL (ref 4.0–10.5)
nRBC: 2.5 % — ABNORMAL HIGH (ref 0.0–0.2)

## 2018-06-11 LAB — BPAM RBC
Blood Product Expiration Date: 202006182359
Blood Product Expiration Date: 202006192359
Blood Product Expiration Date: 202006242359
Blood Product Expiration Date: 202006242359
ISSUE DATE / TIME: 202006071413
ISSUE DATE / TIME: 202006072013
ISSUE DATE / TIME: 202006080215
ISSUE DATE / TIME: 202006080810
Unit Type and Rh: 6200
Unit Type and Rh: 6200
Unit Type and Rh: 6200
Unit Type and Rh: 6200

## 2018-06-11 LAB — BASIC METABOLIC PANEL
Anion gap: 6 (ref 5–15)
BUN: 64 mg/dL — ABNORMAL HIGH (ref 8–23)
CO2: 30 mmol/L (ref 22–32)
Calcium: 7.8 mg/dL — ABNORMAL LOW (ref 8.9–10.3)
Chloride: 103 mmol/L (ref 98–111)
Creatinine, Ser: 2.66 mg/dL — ABNORMAL HIGH (ref 0.61–1.24)
GFR calc Af Amer: 25 mL/min — ABNORMAL LOW (ref >60)
GFR calc non Af Amer: 21 mL/min — ABNORMAL LOW (ref >60)
Glucose, Bld: 159 mg/dL — ABNORMAL HIGH (ref 70–99)
Potassium: 4.5 mmol/L (ref 3.5–5.1)
Sodium: 139 mmol/L (ref 135–145)

## 2018-06-11 LAB — GLUCOSE, CAPILLARY
Glucose-Capillary: 180 mg/dL — ABNORMAL HIGH (ref 70–99)
Glucose-Capillary: 162 mg/dL — ABNORMAL HIGH (ref 70–99)
Glucose-Capillary: 151 mg/dL — ABNORMAL HIGH (ref 70–99)
Glucose-Capillary: 142 mg/dL — ABNORMAL HIGH (ref 70–99)
Glucose-Capillary: 105 mg/dL — ABNORMAL HIGH (ref 70–99)
Glucose-Capillary: 129 mg/dL — ABNORMAL HIGH (ref 70–99)

## 2018-06-11 LAB — TYPE AND SCREEN
ABO/RH(D): A POS
Antibody Screen: NEGATIVE
Unit division: 0
Unit division: 0
Unit division: 0
Unit division: 0

## 2018-06-11 LAB — MAGNESIUM: Magnesium: 2.1 mg/dL (ref 1.7–2.4)

## 2018-06-11 SURGERY — COLONOSCOPY WITH PROPOFOL
Anesthesia: General

## 2018-06-11 MED ORDER — LIDOCAINE HCL (CARDIAC) PF 100 MG/5ML IV SOSY
PREFILLED_SYRINGE | INTRAVENOUS | Status: DC | PRN
  Administered 2018-06-11: 50 mg via INTRAVENOUS

## 2018-06-11 MED ORDER — SODIUM CHLORIDE 0.9 % IV SOLN
INTRAVENOUS | Status: DC
  Administered 2018-06-11: 16:00:00 via INTRAVENOUS

## 2018-06-11 MED ORDER — PROPOFOL 10 MG/ML IV BOLUS
INTRAVENOUS | Status: DC | PRN
  Administered 2018-06-11: 20 mg via INTRAVENOUS
  Administered 2018-06-11: 10 mg via INTRAVENOUS
  Administered 2018-06-11: 20 mg via INTRAVENOUS

## 2018-06-11 MED ORDER — PEG 3350-KCL-NA BICARB-NACL 420 G PO SOLR
4000.0000 mL | Freq: Once | ORAL | Status: AC
  Administered 2018-06-11: 4000 mL via ORAL
  Filled 2018-06-11: qty 4000

## 2018-06-11 MED ORDER — PROPOFOL 500 MG/50ML IV EMUL
INTRAVENOUS | Status: DC | PRN
  Administered 2018-06-11: 40 ug/kg/min via INTRAVENOUS

## 2018-06-11 NOTE — Anesthesia Preprocedure Evaluation (Addendum)
Anesthesia Evaluation  Patient identified by MRN, date of birth, ID band Patient awake    Reviewed: Allergy & Precautions, H&P , NPO status , Patient's Chart, lab work & pertinent test results  Airway Mallampati: III  TM Distance: >3 FB    Comment: Large neck Dental  (+) Edentulous Upper, Edentulous Lower   Pulmonary COPD (2L nearly ATC),  oxygen dependent, former smoker,           Cardiovascular hypertension,      Neuro/Psych negative neurological ROS  negative psych ROS   GI/Hepatic negative GI ROS, Neg liver ROS,   Endo/Other  diabetes  Renal/GU CRFRenal disease  negative genitourinary   Musculoskeletal   Abdominal   Peds  Hematology  (+) Blood dyscrasia, anemia , Hgb 5.6 this AM, s/p 1 unit pRBC with Hbg 7.0, 2nd unit pRBC running   Anesthesia Other Findings Obesity  Past Medical History: No date: COPD (chronic obstructive pulmonary disease) (HCC) No date: Diabetes mellitus without complication (HCC) No date: Hyperlipidemia No date: Hypertension  History reviewed. No pertinent surgical history.  BMI    Body Mass Index:  35.57 kg/m      Reproductive/Obstetrics negative OB ROS                             Anesthesia Physical  Anesthesia Plan  ASA: III  Anesthesia Plan: General   Post-op Pain Management:    Induction:   PONV Risk Score and Plan: Propofol infusion and TIVA  Airway Management Planned: Natural Airway and Nasal Cannula  Additional Equipment:   Intra-op Plan:   Post-operative Plan:   Informed Consent: I have reviewed the patients History and Physical, chart, labs and discussed the procedure including the risks, benefits and alternatives for the proposed anesthesia with the patient or authorized representative who has indicated his/her understanding and acceptance.     Dental Advisory Given  Plan Discussed with: Anesthesiologist and  CRNA  Anesthesia Plan Comments:         Anesthesia Quick Evaluation

## 2018-06-11 NOTE — Progress Notes (Signed)
[facility] [place] NAME: [facility] [name]    MR#:  [id number]  DATE OF BIRTH:  Oct 18, 1933  SUBJECTIVE:   Chief Complaint  Patient presents with  . Blood Infection   Patient is seen at the bedside. NAD. Overall he feels his condition is gradually improving but still with mild SOB requiring supplemental oxygen. No new episode of melena reported. Voices no new complaints.    S/p EGD yesterday pending colonoscopy today.  REVIEW OF SYSTEMS:  Review of Systems  Constitutional: Negative for chills, fever, malaise/fatigue and weight loss.  HENT: Negative for congestion, hearing loss and sore throat.   Eyes: Negative for blurred vision and double vision.  Respiratory: Positive for shortness of breath. Negative for cough and wheezing.   Cardiovascular: Positive for orthopnea and leg swelling. Negative for chest pain and palpitations.  Gastrointestinal: Positive for melena. Negative for abdominal pain, diarrhea, nausea and vomiting.  Genitourinary: Negative for dysuria and urgency.  Musculoskeletal: Negative for myalgias.  Skin: Negative for rash.  Neurological: Negative for dizziness, sensory change, speech change, focal weakness and headaches.  Psychiatric/Behavioral: Negative for depression.   DRUG ALLERGIES:   Allergies  Allergen Reactions  . Codeine Other (See Comments)    Constipation   VITALS:  Blood pressure (!) 126/51, pulse 61, temperature 98.2 F (36.8 C), resp. rate 16, height 5\' 11"  (1.803 m), weight 115.7 kg, SpO2 100 %. PHYSICAL EXAMINATION:   GENERAL:  85 y.o.-year-old patient lying in the bed with no acute distress.  EYES: Pupils equal, round, reactive to light and accommodation. No scleral icterus. Extraocular muscles intact.  HEENT: Head atraumatic, normocephalic. Oropharynx and nasopharynx clear.  NECK:  Supple, no jugular venous distention. No thyroid enlargement, no tenderness.  LUNGS: Normal breath sounds bilaterally,  no wheezing, rales,rhonchi or crepitation. No use of accessory muscles of respiration.  CARDIOVASCULAR: S1, S2 normal. No murmurs, rubs, or gallops.  ABDOMEN: Distended and non-tender. Bowel sounds present. No organomegaly or mass.  EXTREMITIES: Bilateral pitting edema of lower extremities with anterior erythema, tenderness present.  No cyanosis, or clubbing. No rash or lesions. + pedal pulses MUSCULOSKELETAL: Normal bulk, and power was 5+ grip and elbow, knee, and ankle flexion and extension bilaterally.  NEUROLOGIC:Alert and oriented x 3. CN 2-12 intact. Sensation to light touch and cold stimuli intact bilaterally. Finger to nose nl. Babinski is downgoing. DTR's (biceps, patellar, and achilles) 2+ and symmetric throughout. Gait not tested due to safety concern. PSYCHIATRIC: The patient is alert and oriented x 3.  SKIN: As below   Left Leg    Right Leg  DATA REVIEWED:  LABORATORY PANEL:  Male CBC Recent Labs  Lab 06/11/18 0337  WBC 9.3  HGB 7.3*  HCT 23.2*  PLT 193   ------------------------------------------------------------------------------------------------------------------ Chemistries  Recent Labs  Lab 06/09/18 1126  06/11/18 0337  NA 137   < > 139  K 4.5   < > 4.5  CL 103   < > 103  CO2 25   < > 30  GLUCOSE 177*   < > 159*  BUN 72*   < > 64*  CREATININE 2.51*   < > 2.66*  CALCIUM 7.8*   < > 7.8*  MG  --   --  2.1  AST 21  --   --   ALT 17  --   --   ALKPHOS 33*  --   --   BILITOT 0.7  --   --    < > =  values in this interval not displayed.   RADIOLOGY:  Ct Abdomen Pelvis Wo Contrast  Result Date: 06/09/2018 CLINICAL DATA:  Dark red blood per rectum with weakness and fever. EXAM: CT ABDOMEN AND PELVIS WITHOUT CONTRAST TECHNIQUE: Multidetector CT imaging of the abdomen and pelvis was performed following the standard protocol without IV contrast. COMPARISON:  None. FINDINGS: Lower chest: Small right pleural effusion with associated right basilar  atelectasis. Mild calcified plaque over the left main, left anterior descending and right coronary arteries. Calcified plaque over the thoracic aorta. Hepatobiliary: Single 1.8 cm calcified gallstone. Liver and biliary tree are normal. Pancreas: Normal. Spleen: Normal. Adrenals/Urinary Tract: Adrenal glands are normal. Kidneys are normal in size without hydronephrosis or nephrolithiasis. Ureters and bladder are normal. Stomach/Bowel: Stomach and small bowel are normal. Appendix is normal. Colon is normal. Vascular/Lymphatic: Moderate calcified plaque over the abdominal aorta. No adenopathy. Reproductive: Normal. Other: No free fluid or focal inflammatory change. Small periumbilical hernia containing only peritoneal fat. Musculoskeletal: Degenerative change of the spine and hips. IMPRESSION: No acute findings in the abdomen/pelvis. Small right pleural effusion with associated right basilar atelectasis. 1.8 cm single gallstone. Aortic Atherosclerosis (ICD10-I70.0). Atherosclerotic coronary artery disease. Small periumbilical hernia containing only peritoneal fat. Electronically Signed   By: Marin Olp M.D.   On: 06/09/2018 14:56   ASSESSMENT AND PLAN:   85 y.o. male 85 y.o. male COPD, hypertension, diabetes mellitus, hyperlipidemia, anemia and chronic kidney disease, and chronic kidney disease stage III presenting to the ED with generalized weakness.  He is found to have hemoglobin of 3.6 on admission.  1. GI Bleed = Likely Lower as patient presenting with rectal bleed without evidence hemodynamic instability - CT abdomen and pelvis shows 1.8 cm gallstone otherwise no acute finding - s/p EGD 6/8 with no significant finding or source of bleeding - Plan for colonoscopy today - Continue pantoprazole gtt 8mg /hr - Hold NSAIDs, steroids, ASA - GI input appreciated , will continue to follow with them  2. Severe anemia (Hgb 3.6 on admission) -likely secondary to GI bleed as above s/p PRBC for total of  4units - Hgb improved now 7.3 - Continue to monitor H&H - Transfuse PRN Hgb<7  3. DM: His sugars have not been well-controlled on current regimen  - Hold home meds - Continue sliding scale insulin coverage  - ADA 2100 calorie diet  - FS BS qac and qhs   4.  Acute on chronic kidney disease -BUN and creatinine elevated on admission - Continue to monitor renal function - Nephrology consult   5.  Bilateral lower extremity cellulitis with group B strep bacteremia - Previous work-up with ultrasound negative for DVT - Recently treated with broad-spectrum antibiotic coverage with cefepime and vancomycin - blood cultures shows no growth so far - Continue amoxicillin  6.  COPD= no evidence of exacerbation  - DuoNeb every 6 hours  - Incentive spirometry every hour while awake  - Patient has O2 per nasal cannula at 2 L/min  7. HLD  + Goal LDL<70 -Pravastatin 10 mg  8. HTN- slightly elevated + Goal BP <140/90 - Continue amlodipine   Spoke with patient's daughter Arthur [name] today 06/11/18 and updated her on patient's progress and plan of care.  All the records are reviewed and case discussed with Care Management/Social Worker. Management plans discussed with the patient, family and they are in agreement.  CODE STATUS: DNR  TOTAL TIME TAKING CARE OF THIS PATIENT: 35 minutes.   More than 50% of the time  was spent in counseling/coordination of care: YES  POSSIBLE D/C IN 1-2 DAYS, DEPENDING ON CLINICAL CONDITION.   on 06/11/2018 at 12:39 PM  This patient was staffed with Dr. Valetta Fuller, [facility]  who personally evaluated patient, reviewed documentation and agreed with assessment and plan of care as above.  Rufina Falco, DNP, FNP-BC [facility] Nurse Practitioner   Between 7am to 6pm - Pager [phone]  After 6pm go to [email or web address] - Proofreader  [facility]  Office  [phone]  CC: Primary care physician; Arthur Hire, Arthur [name]   Note: This dictation was prepared with Dragon dictation along with smaller phrase technology. Any transcriptional errors that result from this process are unintentional.

## 2018-06-11 NOTE — Consult Note (Signed)
[facility] CONSULT NOTE    Date: 06/11/2018                  Patient Name:  Arthur [name]  MRN: [id number]  DOB: [birth year]  Age / Sex: 85 y.o., male         PCP: Baxter Hire, MD                 Service Requesting Consult: Hospitalist                 Reason for Consult: Acute renal failure/CKD IV            History of Present Illness: Patient is a 85 y.o. male with a PMHx of COPD, diabetes mellitus type 2, hyperlipidemia, hypertension, chronic kidney disease stage IV baseline EGFR of 27, who was admitted to [facility] on 06/09/2018 for evaluation of severe anemia and melena.  Patient states that he has had very dark stools 3 to 4 days prior to admission.  Upon presentation his hemoglobin was quite low at 3.6.  He underwent colonoscopy today.  We are asked to see him for evaluation management of acute renal failure in the setting of known chronic kidney disease stage IV.  His baseline creatinine is 2.2 with an EGFR 27.  Upon presentation his creatinine was up to 7.6 with a BUN of 71.  Patient has received multiple blood transfusions and hemoglobin up to 7.3.  He denies NSAID ingestion.   Medications: Outpatient medications: Medications Prior to Admission  Medication Sig Dispense Refill Last Dose  . albuterol (VENTOLIN HFA) 108 (90 Base) MCG/ACT inhaler Inhale 2 puffs into the lungs every 6 (six) hours as needed for wheezing or shortness of breath. 1 Inhaler 2 Unknown at PRN  . amLODipine (NORVASC) 2.5 MG tablet Take 2.5 mg by mouth daily.   06/09/2018 at 0900  . furosemide (LASIX) 20 MG tablet Take 20 mg by mouth daily as needed for edema.   Unknown at PRN  . guaiFENesin-dextromethorphan (ROBITUSSIN DM) 100-10 MG/5ML syrup Take 5 mLs by mouth every 4 (four) hours as needed for cough. 118 mL 0 Unknown at PRN  . insulin aspart (NOVOLOG) 100 UNIT/ML injection Inject 20 Units into the skin 3 (three) times daily before meals.   06/08/2018 at Unknown time  .  ipratropium-albuterol (DUONEB) 0.5-2.5 (3) MG/3ML SOLN Take 3 mLs by nebulization every 6 (six) hours as needed. 360 mL 0 Unknown at PRN  . LEVEMIR 100 UNIT/ML injection Inject 0.12 mLs (12 Units total) into the skin daily. (Patient taking differently: Inject 40 Units into the skin at bedtime. ) 10 mL 0 06/08/2018 at 2100  . nystatin cream (MYCOSTATIN) Apply topically 2 (two) times daily. 30 g 0 06/09/2018 at 0900  . pravastatin (PRAVACHOL) 10 MG tablet Take 10 mg by mouth at bedtime.   06/08/2018 at 2100  . tamsulosin (FLOMAX) 0.4 MG CAPS capsule Take 0.4 mg by mouth daily.   06/09/2018 at 0900    Current medications: Current Facility-Administered Medications  Medication Dose Route Frequency Provider Last Rate Last Dose  . [MAR Hold] 0.9 %  sodium chloride infusion  10 mL/hr Intravenous Once Earleen Newport, MD      . 0.9 %  sodium chloride infusion   Intravenous Continuous Jonathon Bellows, MD      . 0.9 %  sodium chloride infusion   Intravenous Continuous Jonathon Bellows, MD 10 mL/hr at 06/11/18 1532    . [MAR Hold] amLODipine (  NORVASC) tablet 2.5 mg  2.5 mg Oral Daily Lang Snow, NP   2.5 mg at 06/10/18 1237  . [MAR Hold] insulin aspart (novoLOG) injection 0-5 Units  0-5 Units Subcutaneous QHS Mayo, Pete Pelt, MD   2 Units at 06/10/18 2204  . [MAR Hold] insulin aspart (novoLOG) injection 0-9 Units  0-9 Units Subcutaneous TID WC Mayo, Pete Pelt, MD   2 Units at 06/11/18 1218  . [MAR Hold] ipratropium-albuterol (DUONEB) 0.5-2.5 (3) MG/3ML nebulizer solution 3 mL  3 mL Nebulization Q6H PRN Ouma, Bing Neighbors, NP      . Doug Sou Hold] nystatin cream (MYCOSTATIN)   Topical BID Lang Snow, NP      . Doug Sou Hold] ondansetron [facility]) injection 4 mg  4 mg Intravenous Q4H PRN Mansy, Jan A, MD   4 mg at 06/09/18 2347  . pantoprazole (PROTONIX) 80 mg in sodium chloride 0.9 % 250 mL (0.32 mg/mL) infusion  8 mg/hr Intravenous Continuous Lang Snow, NP 25 mL/hr at 06/11/18 0235 8  mg/hr at 06/11/18 0235  . [MAR Hold] patiromer (VELTASSA) packet 8.4 g  8.4 g Oral Daily Mayo, Pete Pelt, MD   8.4 g at 06/10/18 1238  . polyethylene glycol-electrolytes (NuLYTELY/GoLYTELY) solution 4,000 mL  4,000 mL Oral Once Jonathon Bellows, MD      . Doug Sou Hold] pravastatin (PRAVACHOL) tablet 10 mg  10 mg Oral QHS Lang Snow, NP   10 mg at 06/10/18 2203  . [MAR Hold] tamsulosin (FLOMAX) capsule 0.4 mg  0.4 mg Oral Daily Lang Snow, NP   0.4 mg at 06/10/18 1238      Allergies: Allergies  Allergen Reactions  . Codeine Other (See Comments)    Constipation      Past Medical History: Past Medical History:  Diagnosis Date  . COPD (chronic obstructive pulmonary disease) ([place])   . Diabetes mellitus without complication ([place])   . Hyperlipidemia   . Hypertension      Past Surgical History: Past Surgical History:  Procedure Laterality Date  . ESOPHAGOGASTRODUODENOSCOPY Left 06/10/2018   Procedure: ESOPHAGOGASTRODUODENOSCOPY (EGD);  Surgeon: Jonathon Bellows, MD;  Location[facility] ENDOSCOPY;  Service: Gastroenterology;  Laterality: Left;     Family History: History reviewed. No pertinent family history.   Social History: Social History   Socioeconomic History  . Marital status: Widowed    Spouse name: Not on file  . Number of children: Not on file  . Years of education: Not on file  . Highest education level: Not on file  Occupational History  . Not on file  Social Needs  . Financial resource strain: Not on file  . Food insecurity:    Worry: Not on file    Inability: Not on file  . Transportation needs:    Medical: Not on file    Non-medical: Not on file  Tobacco Use  . Smoking status: Former Smoker    Last attempt to quit: 11/04/1982    Years since quitting: 35.6  . Smokeless tobacco: Current User  Substance and Sexual Activity  . Alcohol use: Yes    Comment: 1/5 /wk  . Drug use: No  . Sexual activity: Not on file  Lifestyle  . Physical  activity:    Days per week: Not on file    Minutes per session: Not on file  . Stress: Not on file  Relationships  . Social connections:    Talks on phone: Not on file    Gets together: Not on file  Attends religious service: Not on file    Active member of club or organization: Not on file    Attends meetings of clubs or organizations: Not on file    Relationship status: Not on file  . Intimate partner violence:    Fear of current or ex partner: Not on file    Emotionally abused: Not on file    Physically abused: Not on file    Forced sexual activity: Not on file  Other Topics Concern  . Not on file  Social History Narrative  . Not on file     Review of Systems: Review of Systems  Constitutional: Positive for malaise/fatigue. Negative for fever.  HENT: Negative for congestion, hearing loss and tinnitus.   Eyes: Negative for blurred vision and double vision.  Respiratory: Negative for cough, hemoptysis and sputum production.   Cardiovascular: Negative for chest pain, palpitations and orthopnea.  Gastrointestinal: Positive for melena. Negative for heartburn, nausea and vomiting.  Genitourinary: Negative for dysuria and frequency.  Musculoskeletal: Negative for joint pain and myalgias.  Skin: Negative for itching and rash.  Neurological: Positive for weakness. Negative for focal weakness.  Endo/Heme/Allergies: Negative for polydipsia. Does not bruise/bleed easily.  Psychiatric/Behavioral: Negative for depression.     Vital Signs: Blood pressure (!) 152/62, pulse 82, temperature (!) 97 F (36.1 C), temperature source Tympanic, resp. rate (!) 21, height _0  (1.803 m), weight 115.7 kg, SpO2 96 %.  Weight trends: Filed Weights   06/09/18 1550  Weight: 115.7 kg    Physical Exam: General: NAD, resting in bed  Head: Normocephalic, atraumatic.  Eyes: Anicteric, EOMI  Nose: Mucous membranes moist, not inflammed, nonerythematous.  Throat: Oropharynx nonerythematous,  no exudate appreciated.   Neck: Supple, trachea midline.  Lungs:  Normal respiratory effort. Clear to auscultation BL without crackles or wheezes.  Heart: RRR. S1 and S2 normal without gallop, murmur, or rubs.  Abdomen:  BS normoactive. Soft, Nondistended, non-tender.  No masses or organomegaly.  Extremities: No pretibial edema.  Neurologic: A&O X3, Motor strength is 5/5 in the all 4 extremities  Skin: No visible rashes, scars.    Lab results: Basic Metabolic Panel: Recent Labs  Lab 06/09/18 1126 06/10/18 0213 06/10/18 0732 06/11/18 0337  NA 137 137  --  139  K 4.5 5.4* 5.2* 4.5  CL 103 105  --  103  CO2 25 27  --  30  GLUCOSE 177* 201*  --  159*  BUN 72* 71*  --  64*  CREATININE 2.51* 2.62*  --  2.66*  CALCIUM 7.8* 7.7*  --  7.8*  MG  --   --   --  2.1    Liver Function Tests: Recent Labs  Lab 06/09/18 1126  AST 21  ALT 17  ALKPHOS 33*  BILITOT 0.7  PROT 5.6*  ALBUMIN 2.9*   No results for input(s): LIPASE, AMYLASE in the last 168 hours. No results for input(s): AMMONIA in the last 168 hours.  CBC: Recent Labs  Lab 06/09/18 1126 06/10/18 0006 06/10/18 0213 06/10/18 1006 06/11/18 0337  WBC 11.6*  --  11.7*  --  9.3  NEUTROABS 8.2*  --   --   --  6.4  HGB 3.6* 6.0* 5.6* 7.0* 7.3*  HCT 12.4* 18.7* 17.5*  --  23.2*  MCV 100.0  --  93.1  --  91.0  PLT 272  --  221  --  193    Cardiac Enzymes: No results for input(s): CKTOTAL, CKMB, CKMBINDEX, TROPONINI  in the last 168 hours.  BNP: Invalid input(s): POCBNP  CBG: Recent Labs  Lab 06/10/18 1659 06/10/18 2106 06/11/18 0809 06/11/18 1212 06/11/18 1312  GLUCAP 223* 202* 180* 162* 151*    Microbiology: Results for orders placed or performed during the hospital encounter of 06/09/18  Blood Culture (routine x 2)     Status: None (Preliminary result)   Collection Time: 06/09/18 11:32 AM  Result Value Ref Range Status   Specimen Description BLOOD RIGHT ANTECUBITAL  Final   Special Requests   Final     BOTTLES DRAWN AEROBIC AND ANAEROBIC Blood Culture results may not be optimal due to an excessive volume of blood received in culture bottles   Culture   Final    NO GROWTH 2 DAYS Performed at [facility], [address] [place], [place] [address]    Report Status PENDING  Incomplete  Blood Culture (routine x 2)     Status: None (Preliminary result)   Collection Time: 06/09/18 11:32 AM  Result Value Ref Range Status   Specimen Description BLOOD LEFT ANTECUBITAL  Final   Special Requests   Final    BOTTLES DRAWN AEROBIC AND ANAEROBIC Blood Culture results may not be optimal due to an excessive volume of blood received in culture bottles   Culture   Final    NO GROWTH 2 DAYS Performed at [facility], [address], [place], [place] [address]    Report Status PENDING  Incomplete  Urine culture     Status: Abnormal (Preliminary result)   Collection Time: 06/09/18 12:55 PM  Result Value Ref Range Status   Specimen Description   Final    URINE, RANDOM Performed at [facility], [address], [place], [place] [address]    Special Requests   Final    NONE Performed at [facility], [address] [place], [place] [address]    Culture (A)  Final    10,000 COLONIES/mL ENTEROBACTER CLOACAE REPEATING SUSCEPTIBILITIES Performed at [facility], [place] [address], [place], [place] [address]    Report Status PENDING  Incomplete  SARS Coronavirus 2 (CEPHEID - Performed in [place] hospital lab), Hosp Order     Status: None   Collection Time: 06/09/18  1:41 PM  Result Value Ref Range Status   SARS Coronavirus 2 NEGATIVE NEGATIVE Final    Comment: (NOTE) If result is NEGATIVE SARS-CoV-2 target nucleic acids are NOT DETECTED. The SARS-CoV-2 RNA is generally detectable in upper and lower  respiratory specimens during the acute phase of infection. The lowest  concentration of SARS-CoV-2 viral copies this assay can detect is 250  copies / mL.  A negative result does not preclude SARS-CoV-2 infection  and should not be used as the sole basis for treatment or other  patient management decisions.  A negative result may occur with  improper specimen collection / handling, submission of specimen other  than nasopharyngeal swab, presence of viral mutation(s) within the  areas targeted by this assay, and inadequate number of viral copies  (<250 copies / mL). A negative result must be combined with clinical  observations, patient history, and epidemiological information. If result is POSITIVE SARS-CoV-2 target nucleic acids are DETECTED. The SARS-CoV-2 RNA is generally detectable in upper and lower  respiratory specimens dur ing the acute phase of infection.  Positive  results are indicative of active infection with SARS-CoV-2.  Clinical  correlation with patient history and other diagnostic information is  necessary to  determine patient infection status.  Positive results do  not rule out bacterial infection or co-infection with other viruses. If result is PRESUMPTIVE POSTIVE SARS-CoV-2 nucleic acids MAY BE PRESENT.   A presumptive positive result was obtained on the submitted specimen  and confirmed on repeat testing.  While 2019 novel coronavirus  (SARS-CoV-2) nucleic acids may be present in the submitted sample  additional confirmatory testing may be necessary for epidemiological  and / or clinical management purposes  to differentiate between  SARS-CoV-2 and other Sarbecovirus currently known to infect humans.  If clinically indicated additional testing with an alternate test  methodology [phone]) is advised. The SARS-CoV-2 RNA is generally  detectable in upper and lower respiratory sp ecimens during the acute  phase of infection. The expected result is Negative. Fact Sheet for Patients:  StrictlyIdeas.no Fact Sheet for Healthcare Providers: BankingDealers.co.za This test is not  yet approved or cleared by the [place] FDA and has been authorized for detection and/or diagnosis of SARS-CoV-2 by FDA under an Emergency Use Authorization (EUA).  This EUA will remain in effect (meaning this test can be used) for the duration of the COVID-19 declaration under Section 564(b)(1) of the Act, 21 U.S.C. section 360bbb-3(b)(1), unless the authorization is terminated or revoked sooner. Performed at [facility], [address] [place], [place] [address]     Coagulation Studies: Recent Labs    06/10/18 0732  LABPROT 15.3*  INR 1.2    Urinalysis: Recent Labs    06/09/18 1255  COLORURINE YELLOW*  LABSPEC 1.012  PHURINE 5.0  GLUCOSEU NEGATIVE  HGBUR NEGATIVE  BILIRUBINUR NEGATIVE  KETONESUR NEGATIVE  PROTEINUR NEGATIVE  NITRITE NEGATIVE  LEUKOCYTESUR NEGATIVE      Imaging:  No results found.   Assessment & Plan: Pt is a 85 y.o. male with a PMHx of COPD, diabetes mellitus type 2, hyperlipidemia, hypertension, chronic kidney disease stage IV baseline EGFR of 27, who was admitted to [facility] on 06/09/2018 for evaluation of severe anemia and melena.   1.  Acute renal failure/chronic kidney disease stage IV.  Patient's baseline creatinine is 2.2.  Creatinine upon presentation was 2.6.  Patient has received multiple blood transfusions and this should have provided some volume repletion.  Check renal ultrasound to make sure there is no underlying obstruction.  2.  Hyperkalemia.  Potassium down to 4.5.  Consider stopping Veltassa tomorrow.  3.  Anemia of chronic kidney disease/anemia of blood loss.  Patient underwent endoscopy today.  Appreciate gastroenterology assistance.  Continue to monitor hemoglobin closely.

## 2018-06-11 NOTE — Anesthesia Postprocedure Evaluation (Signed)
Anesthesia Post Note  Patient: Arthur [name]  Procedure(s) Performed: COLONOSCOPY WITH PROPOFOL (N/A )  Anesthesia Type: General     Last Vitals:  Vitals:   06/11/18 1451 06/11/18 1454  BP:  (!) 132/57  Pulse: 81 80  Resp: 20   Temp:    SpO2: 95% 95%    Last Pain:  Vitals:   06/11/18 1454  TempSrc:   PainSc: 0-No pain                 Leander Rams

## 2018-06-11 NOTE — H&P (Signed)
Jonathon Bellows, MD [address], [place], [place], [place], [address] [address], [place], [place], [place], [address] Phone: [phone]  Fax: [phone]  Primary Care Physician:  Baxter Hire, MD   Pre-Procedure History & Physical: HPI:  Arthur [name] is a 85 y.o. male is here for an colonoscopy.   Past Medical History:  Diagnosis Date  . COPD (chronic obstructive pulmonary disease) ([place])   . Diabetes mellitus without complication ([place])   . Hyperlipidemia   . Hypertension     Past Surgical History:  Procedure Laterality Date  . ESOPHAGOGASTRODUODENOSCOPY Left 06/10/2018   Procedure: ESOPHAGOGASTRODUODENOSCOPY (EGD);  Surgeon: Jonathon Bellows, MD;  [facility] END[facility];  Service: Gastroenterology;  Laterality: Left;    Prior to Admission medications   Medication Sig Start Date End Date Taking? Authorizing Provider  albuterol (VENTOLIN HFA) 108 (90 Base) MCG/ACT inhaler Inhale 2 puffs into the lungs every 6 (six) hours as needed for wheezing or shortness of breath. 05/27/18  Yes Demetrios Loll, MD  amLODipine (NORVASC) 2.5 MG tablet Take 2.5 mg by mouth daily. 04/26/18  Yes [provider]  furosemide (LASIX) 20 MG tablet Take 20 mg by mouth daily as needed for edema. 04/25/18  Yes [provider]  guaiFENesin-dextromethorphan (ROBITUSSIN DM) 100-10 MG/5ML syrup Take 5 mLs by mouth every 4 (four) hours as needed for cough. 05/27/18  Yes Demetrios Loll, MD  insulin aspart (NOVOLOG) 100 UNIT/ML injection Inject 20 Units into the skin 3 (three) times daily before meals.   Yes [provider]  ipratropium-albuterol (DUONEB) 0.5-2.5 (3) MG/3ML SOLN Take 3 mLs by nebulization every 6 (six) hours as needed. 05/28/18  Yes Mody, Sital, MD  LEVEMIR 100 UNIT/ML injection Inject 0.12 mLs (12 Units total) into the skin daily. Patient taking differently: Inject 40 Units into the skin at bedtime.  05/27/18  Yes Demetrios Loll, MD  nystatin cream (MYCOSTATIN) Apply  topically 2 (two) times daily. 05/28/18  Yes Mody, Ulice Bold, MD  pravastatin (PRAVACHOL) 10 MG tablet Take 10 mg by mouth at bedtime. 04/25/18  Yes [provider]  tamsulosin (FLOMAX) 0.4 MG CAPS capsule Take 0.4 mg by mouth daily. 04/25/18  Yes [provider]    Allergies as of 06/09/2018 - Review Complete 06/09/2018  Allergen Reaction Noted  . Codeine Other (See Comments) 08/06/2013    History reviewed. No pertinent family history.  Social History   Socioeconomic History  . Marital status: Widowed    Spouse name: Not on file  . Number of children: Not on file  . Years of education: Not on file  . Highest education level: Not on file  Occupational History  . Not on file  Social Needs  . Financial resource strain: Not on file  . Food insecurity:    Worry: Not on file    Inability: Not on file  . Transportation needs:    Medical: Not on file    Non-medical: Not on file  Tobacco Use  . Smoking status: Former Smoker    Last attempt to quit: 11/04/1982    Years since quitting: 35.6  . Smokeless tobacco: Current User  Substance and Sexual Activity  . Alcohol use: Yes    Comment: 1/5 /wk  . Drug use: No  . Sexual activity: Not on file  Lifestyle  . Physical activity:    Days per week: Not on file    Minutes per session: Not on file  . Stress: Not on file  Relationships  .  Social connections:    Talks on phone: Not on file    Gets together: Not on file    Attends religious service: Not on file    Active member of club or organization: Not on file    Attends meetings of clubs or organizations: Not on file    Relationship status: Not on file  . Intimate partner violence:    Fear of current or ex partner: Not on file    Emotionally abused: Not on file    Physically abused: Not on file    Forced sexual activity: Not on file  Other Topics Concern  . Not on file  Social History Narrative  . Not on file    Review of Systems: See HPI, otherwise negative  ROS  Physical Exam: BP (!) 158/45   Pulse 63   Temp 97.8 F (36.6 C)   Resp 18   Ht 5\' 11"  (1.803 m)   Wt 115.7 kg   SpO2 100%   BMI 35.57 kg/m  General:   Alert,  pleasant and cooperative in NAD Head:  Normocephalic and atraumatic. Neck:  Supple; no masses or thyromegaly. Lungs:  Clear throughout to auscultation, normal respiratory effort.    Heart:  +S1, +S2, Regular rate and rhythm, No edema. Abdomen:  Soft, nontender and nondistended. Normal bowel sounds, without guarding, and without rebound.   Neurologic:  Alert and  oriented x4;  grossly normal neurologically.  Impression/Plan: ART LEVAN is here for an colonoscopy to be performed for GI bleed Risks, benefits, limitations, and alternatives regarding  colonoscopy have been reviewed with the patient.  Questions have been answered.  All parties agreeable.   Jonathon Bellows, MD  06/11/2018, 1:57 PM

## 2018-06-11 NOTE — Op Note (Signed)
[facility] Patient Name: Arthur [name] Procedure Date: 06/11/2018 1:54 PM MRN: [id number] Account #: [id number] Date of Birth: 16-Jul-1933 Admit Type: Inpatient Age: 85 Room: [facility] ENDO ROOM 3 Gender: Male Note Status: Finalized Procedure:            Colonoscopy Indications:          Melena Providers:            Jonathon Bellows MD, MD Medicines:            Monitored Anesthesia Care Complications:        No immediate complications. Procedure:            Pre-Anesthesia Assessment:                       - Prior to the procedure, a History and Physical was                        performed, and patient medications, allergies and                        sensitivities were reviewed. The patient's tolerance of                        previous anesthesia was reviewed.                       - The risks and benefits of the procedure and the                        sedation options and risks were discussed with the                        patient. All questions were answered and informed                        consent was obtained.                       - ASA Grade Assessment: III - A patient with severe                        systemic disease.                       After obtaining informed consent, the colonoscope was                        passed under direct vision. Throughout the procedure,                        the patient's blood pressure, pulse, and oxygen                        saturations were monitored continuously. The                        Colonoscope was introduced through the anus with the                        intention of advancing to the cecum. The scope was  advanced to the transverse colon before the procedure                        was aborted. Medications were given. The colonoscopy                        was performed with moderate difficulty due to                        inadequate bowel prep. The patient tolerated the                     procedure well. Findings:      The perianal and digital rectal examinations were normal.      a large amount of semi-liquid stool was found in the entire colon .       tarry black in color, despite washing very poor visualization Impression:           - Stool in the entire examined colon.                       - No specimens collected. Recommendation:       - Return patient to hospital ward for ongoing care.                       - Clear liquid diet.                       - Continue present medications.                       - Repeat colonoscopy tomorrow because the bowel                        preparation was poor. Procedure Code(s):    --- Professional ---                       [id number], 42, Colonoscopy, flexible; diagnostic, including                        collection of specimen(s) by brushing or washing, when                        performed (separate procedure) Diagnosis Code(s):    --- Professional ---                       K92.1, Melena (includes Hematochezia) CPT copyright 2019 American Medical Association. All rights reserved. The codes documented in this report are preliminary and upon coder review may  be revised to meet current compliance requirements. Jonathon Bellows, MD Jonathon Bellows MD, MD 06/11/2018 2:34:44 PM This report has been signed electronically. Number of Addenda: 0 Note Initiated On: 06/11/2018 1:54 PM Total Procedure Duration: 0 hours 15 minutes 21 seconds  Estimated Blood Loss: Estimated blood loss: none.      [facility]

## 2018-06-11 NOTE — Transfer of Care (Signed)
Immediate Anesthesia Transfer of Care Note  Patient: [name]  Procedure(s) Performed: COLONOSCOPY WITH PROPOFOL (N/A )  Patient Location: PACU  Anesthesia Type:General  Level of Consciousness: awake  Airway & Oxygen Therapy: Patient Spontanous Breathing  Post-op Assessment: Report given to RN  Post vital signs: stable  Last Vitals:  Vitals Value Taken Time  BP 132/57 06/11/2018  2:54 PM  Temp    Pulse 85 06/11/2018  2:58 PM  Resp 20 06/11/2018  2:51 PM  SpO2 98 % 06/11/2018  2:58 PM  Vitals shown include unvalidated device data.  Last Pain:  Vitals:   06/11/18 1454  TempSrc:   PainSc: 0-No pain         Complications: No apparent anesthesia complications

## 2018-06-11 NOTE — Anesthesia Post-op Follow-up Note (Signed)
Anesthesia QCDR form completed.        

## 2018-06-12 ENCOUNTER — Encounter
Admission: EM | Disposition: A | Discharge status: Home Health | Payer: Self-pay | Source: Home / Self Care | Attending: Internal Medicine

## 2018-06-12 ENCOUNTER — Encounter: Payer: Self-pay | Admitting: Gastroenterology

## 2018-06-12 ENCOUNTER — Inpatient Hospital Stay: Payer: Medicare HMO

## 2018-06-12 LAB — BASIC METABOLIC PANEL
Anion gap: 8 (ref 5–15)
BUN: 54 mg/dL — ABNORMAL HIGH (ref 8–23)
CO2: 28 mmol/L (ref 22–32)
Calcium: 8 mg/dL — ABNORMAL LOW (ref 8.9–10.3)
Chloride: 105 mmol/L (ref 98–111)
Creatinine, Ser: 2.38 mg/dL — ABNORMAL HIGH (ref 0.61–1.24)
GFR calc Af Amer: 28 mL/min — ABNORMAL LOW (ref >60)
GFR calc non Af Amer: 24 mL/min — ABNORMAL LOW (ref >60)
Glucose, Bld: 147 mg/dL — ABNORMAL HIGH (ref 70–99)
Potassium: 4.7 mmol/L (ref 3.5–5.1)
Sodium: 141 mmol/L (ref 135–145)

## 2018-06-12 LAB — URINE CULTURE: Culture: 10000 — AB

## 2018-06-12 LAB — CBC
HCT: 25 % — ABNORMAL LOW (ref 39.0–52.0)
Hemoglobin: 7.6 g/dL — ABNORMAL LOW (ref 13.0–17.0)
MCH: 28.9 pg (ref 26.0–34.0)
MCHC: 30.4 g/dL (ref 30.0–36.0)
MCV: 95.1 fL (ref 80.0–100.0)
Platelets: 191 10*3/uL (ref 150–400)
RBC: 2.63 MIL/uL — ABNORMAL LOW (ref 4.22–5.81)
RDW: 19.9 % — ABNORMAL HIGH (ref 11.5–15.5)
WBC: 8.2 10*3/uL (ref 4.0–10.5)
nRBC: 1.1 % — ABNORMAL HIGH (ref 0.0–0.2)

## 2018-06-12 LAB — GLUCOSE, CAPILLARY
Glucose-Capillary: 139 mg/dL — ABNORMAL HIGH (ref 70–99)
Glucose-Capillary: 172 mg/dL — ABNORMAL HIGH (ref 70–99)
Glucose-Capillary: 134 mg/dL — ABNORMAL HIGH (ref 70–99)

## 2018-06-12 SURGERY — COLONOSCOPY WITH PROPOFOL
Anesthesia: General

## 2018-06-12 MED ORDER — FUROSEMIDE 20 MG PO TABS
20.0000 mg | ORAL_TABLET | Freq: Every day | ORAL | Status: DC
  Administered 2018-06-13 – 2018-06-14 (×2): 20 mg via ORAL
  Filled 2018-06-12 (×3): qty 1

## 2018-06-12 MED ORDER — PANTOPRAZOLE SODIUM 40 MG IV SOLR
40.0000 mg | Freq: Two times a day (BID) | INTRAVENOUS | Status: DC
  Administered 2018-06-12 – 2018-06-14 (×4): 40 mg via INTRAVENOUS
  Filled 2018-06-12 (×5): qty 40

## 2018-06-12 MED ORDER — FUROSEMIDE 10 MG/ML IJ SOLN
40.0000 mg | Freq: Once | INTRAMUSCULAR | Status: AC
  Administered 2018-06-12: 40 mg via INTRAVENOUS
  Filled 2018-06-12: qty 4

## 2018-06-12 MED ORDER — SODIUM CHLORIDE 0.9 % IV SOLN: INTRAVENOUS | Status: DC

## 2018-06-12 NOTE — Plan of Care (Signed)
Pt refuses Golytely this shift. Educated on reason for bowel prep pt states he is too tired and wants to rest. MD Tahiliani notified about bowel prep refusal.

## 2018-06-12 NOTE — Care Management Important Message (Signed)
Important Message  Patient Details  Name: Arthur [name] MRN: [id number] Date of Birth: [birth year]   Medicare Important Message Given:  Yes    [name] 06/12/2018, 10:53 AM

## 2018-06-12 NOTE — Progress Notes (Signed)
[place] [facility] [place] NAME: Arthur [name]    MR#:  [id number]  DATE OF BIRTH:  04/16/1933  SUBJECTIVE:   Chief Complaint  Patient presents with  . Blood Infection   Patient is seen at the bedside. NAD. Overall he feels his condition is gradually improving but still with mild SOB requiring supplemental oxygen. No new episode of melena reported. Voices no new complaints.    S/p EGD 6/8, pending colonoscopy today.  REVIEW OF SYSTEMS:  Review of Systems  Constitutional: Negative for chills, fever, malaise/fatigue and weight loss.  HENT: Negative for congestion, hearing loss and sore throat.   Eyes: Negative for blurred vision and double vision.  Respiratory: Positive for shortness of breath. Negative for cough and wheezing.   Cardiovascular: Positive for orthopnea and leg swelling. Negative for chest pain and palpitations.  Gastrointestinal: Positive for melena. Negative for abdominal pain, diarrhea, nausea and vomiting.  Genitourinary: Negative for dysuria and urgency.  Musculoskeletal: Negative for myalgias.  Skin: Negative for rash.  Neurological: Negative for dizziness, sensory change, speech change, focal weakness and headaches.  Psychiatric/Behavioral: Negative for depression.   DRUG ALLERGIES:   Allergies  Allergen Reactions  . Codeine Other (See Comments)    Constipation   VITALS:  Blood pressure (!) 142/57, pulse 76, temperature 98.4 F (36.9 C), temperature source Oral, resp. rate 17, height 5\' 11"  (1.803 m), weight 115.7 kg, SpO2 (!) 79 %. PHYSICAL EXAMINATION:   GENERAL:  85 y.o.-year-old patient lying in the bed with no acute distress.  EYES: Pupils equal, round, reactive to light and accommodation. No scleral icterus. Extraocular muscles intact.  HEENT: Head atraumatic, normocephalic. Oropharynx and nasopharynx clear.  NECK:  Supple, no jugular venous distention. No thyroid enlargement, no tenderness.  LUNGS: Normal  breath sounds bilaterally, no wheezing, rales,rhonchi or crepitation. No use of accessory muscles of respiration.  CARDIOVASCULAR: S1, S2 normal. No murmurs, rubs, or gallops.  ABDOMEN: Distended and non-tender. Bowel sounds present. No organomegaly or mass.  EXTREMITIES: Bilateral pitting edema of lower extremities with anterior erythema, tenderness present.  No cyanosis, or clubbing. No rash or lesions. + pedal pulses MUSCULOSKELETAL: Normal bulk, and power was 5+ grip and elbow, knee, and ankle flexion and extension bilaterally.  NEUROLOGIC:Alert and oriented x 3. CN 2-12 intact. Sensation to light touch and cold stimuli intact bilaterally. Finger to nose nl. Babinski is downgoing. DTR's (biceps, patellar, and achilles) 2+ and symmetric throughout. Gait not tested due to safety concern. PSYCHIATRIC: The patient is alert and oriented x 3.  SKIN: As below  Left Leg    Right Leg    DATA REVIEWED:  LABORATORY PANEL:  Male CBC Recent Labs  Lab 06/12/18 0525  WBC 8.2  HGB 7.6*  HCT 25.0*  PLT 191   ------------------------------------------------------------------------------------------------------------------ Chemistries  Recent Labs  Lab 06/09/18 1126  06/11/18 0337 06/12/18 0525  NA 137   < > 139 141  K 4.5   < > 4.5 4.7  CL 103   < > 103 105  CO2 25   < > 30 28  GLUCOSE 177*   < > 159* 147*  BUN 72*   < > 64* 54*  CREATININE 2.51*   < > 2.66* 2.38*  CALCIUM 7.8*   < > 7.8* 8.0*  MG  --   --  2.1  --   AST 21  --   --   --   ALT 17  --   --   --  ALKPHOS 33*  --   --   --   BILITOT 0.7  --   --   --    < > = values in this interval not displayed.   RADIOLOGY:  No results found. ASSESSMENT AND PLAN:   85 y.o. male 85 y.o. male COPD, hypertension, diabetes mellitus, hyperlipidemia, anemia and chronic kidney disease, and chronic kidney disease stage III presenting to the ED with generalized weakness.  He is found to have hemoglobin of 3.6 on admission.  1.  GI Bleed = Likely Lower as patient presenting with rectal bleed without evidence hemodynamic instability - CT abdomen and pelvis shows 1.8 cm gallstone otherwise no acute finding - s/p EGD 6/8 with no significant finding or source of bleeding - Plan for colonoscopy today - Continue pantoprazole gtt 8mg /hr - Hold NSAIDs, steroids, ASA - GI input appreciated , will continue to follow with them  2. Severe anemia (Hgb 3.6 on admission) -likely secondary to GI bleed as above s/p PRBC for total of 4units - Hgb improved 7.6 today - Continue to monitor H&H - Transfuse PRN Hgb<7  3. DM: His sugars have not been well-controlled on current regimen  - Hold home meds - Continue sliding scale insulin coverage  - ADA 2100 calorie diet  - FS BS qac and qhs   4.  Acute on chronic kidney disease -BUN and creatinine elevated on admission - Continue to monitor renal function - Pending renal ultrasound  - Nephrology input appreciated  5. Hyperkalemia - stable - D/c Veltassa per nephrology recommendation - continue to monitor  6.  Bilateral lower extremity cellulitis with group B strep bacteremia - significant improvement - Previous work-up with ultrasound negative for DVT - Recently treated with broad-spectrum antibiotic coverage with cefepime and vancomycin - blood cultures shows no growth so far - Continue amoxicillin  7.  COPD= no evidence of exacerbation  - DuoNeb every 6 hours  - Incentive spirometry every hour while awake  - Patient has O2 per nasal cannula at 2 L/min  8. HLD  + Goal LDL<70 -Pravastatin 10 mg  9. HTN- slightly elevated + Goal BP <140/90 - Continue amlodipine   Spoke with patient's daughter [name] today 06/12/18 and updated her on patient's progress and plan of care.  All the records are reviewed and case discussed with Care Management/Social Worker. Management plans discussed with the patient, family and they are in agreement.  CODE STATUS: DNR  TOTAL  TIME TAKING CARE OF THIS PATIENT: 36 minutes.   More than 50% of the time was spent in counseling/coordination of care: YES  POSSIBLE D/C IN 1-2 DAYS, DEPENDING ON CLINICAL CONDITION.   on 06/12/2018 at 10:03 AM  This patient was staffed with Dr. Valetta Fuller, [facility]  who personally evaluated patient, reviewed documentation and agreed with assessment and plan of care as above.  Rufina Falco, DNP, FNP-BC [facility] Nurse Practitioner   Between 7am to 6pm - Pager [phone]  After 6pm go to [email or web address] - Proofreader  [facility] [place] [facility]  Office  [phone]  CC: Primary care physician; Baxter Hire, MD  Note: This dictation was prepared with Dragon dictation along with smaller phrase technology. Any transcriptional errors that result from this process are unintentional.

## 2018-06-12 NOTE — Progress Notes (Signed)
[facility]  ROUNDING NOTE   Subjective:  Pt had poor bowel prep. Therefore redoing bowel prep now.  Renal function improving.    Objective:  Vital signs in last 24 hours:  Temp:  [97.8 F (36.6 C)-98.4 F (36.9 C)] 98.4 F (36.9 C) (06/10 0756) Pulse Rate:  [70-78] 76 (06/10 0756) Resp:  [16-18] 17 (06/10 0756) BP: (130-142)/(45-57) 142/57 (06/10 0756) SpO2:  [79 %-96 %] 79 % (06/10 0756)  Weight change:  Filed Weights   06/09/18 1550  Weight: 115.7 kg    Intake/Output: I/O last 3 completed shifts: In: 8022 [P.O.:300; I.V.:1083] Out: 775 [Urine:775]   Intake/Output this shift:  Total I/O In: 240 [P.O.:240] Out: -   Physical Exam: General: No acute distress  Head: Normocephalic, atraumatic. Moist oral mucosal membranes  Eyes: Anicteric  Neck: Supple, trachea midline  Lungs:  Clear to auscultation, normal effort  Heart: S1S2 no rubs  Abdomen:  Soft, nontender, bowel sounds present  Extremities: No peripheral edema.  Neurologic: Awake, alert, following commands  Skin: No lesions       Basic Metabolic Panel: Recent Labs  Lab 06/09/18 1126 06/10/18 0213 06/10/18 0732 06/11/18 0337 06/12/18 0525  NA 137 137  --  139 141  K 4.5 5.4* 5.2* 4.5 4.7  CL 103 105  --  103 105  CO2 25 27  --  30 28  GLUCOSE 177* 201*  --  159* 147*  BUN 72* 71*  --  64* 54*  CREATININE 2.51* 2.62*  --  2.66* 2.38*  CALCIUM 7.8* 7.7*  --  7.8* 8.0*  MG  --   --   --  2.1  --     Liver Function Tests: Recent Labs  Lab 06/09/18 1126  AST 21  ALT 17  ALKPHOS 33*  BILITOT 0.7  PROT 5.6*  ALBUMIN 2.9*   No results for input(s): LIPASE, AMYLASE in the last 168 hours. No results for input(s): AMMONIA in the last 168 hours.  CBC: Recent Labs  Lab 06/09/18 1126 06/10/18 0006 06/10/18 0213 06/10/18 1006 06/11/18 0337 06/12/18 0525  WBC 11.6*  --  11.7*  --  9.3 8.2  NEUTROABS 8.2*  --   --   --  6.4  --   HGB 3.6* 6.0* 5.6* 7.0* 7.3* 7.6*  HCT 12.4*  18.7* 17.5*  --  23.2* 25.0*  MCV 100.0  --  93.1  --  91.0 95.1  PLT 272  --  221  --  193 191    Cardiac Enzymes: No results for input(s): CKTOTAL, CKMB, CKMBINDEX, TROPONINI in the last 168 hours.  BNP: Invalid input(s): POCBNP  CBG: Recent Labs  Lab 06/11/18 1312 06/11/18 1648 06/11/18 2130 06/11/18 2137 06/12/18 0759  GLUCAP 151* 142* 129* 105* 139*    Microbiology: Results for orders placed or performed during the hospital encounter of 06/09/18  Blood Culture (routine x 2)     Status: None (Preliminary result)   Collection Time: 06/09/18 11:32 AM  Result Value Ref Range Status   Specimen Description BLOOD RIGHT ANTECUBITAL  Final   Special Requests   Final    BOTTLES DRAWN AEROBIC AND ANAEROBIC Blood Culture results may not be optimal due to an excessive volume of blood received in culture bottles   Culture   Final    NO GROWTH 3 DAYS Performed at [facility], [address], [place], [place] [address]    Report Status PENDING  Incomplete  Blood Culture (routine x  2)     Status: None (Preliminary result)   Collection Time: 06/09/18 11:32 AM  Result Value Ref Range Status   Specimen Description BLOOD LEFT ANTECUBITAL  Final   Special Requests   Final    BOTTLES DRAWN AEROBIC AND ANAEROBIC Blood Culture results may not be optimal due to an excessive volume of blood received in culture bottles   Culture   Final    NO GROWTH 3 DAYS Performed at [facility], [address], [place], [place] [address]    Report Status PENDING  Incomplete  Urine culture     Status: Abnormal   Collection Time: 06/09/18 12:55 PM  Result Value Ref Range Status   Specimen Description   Final    URINE, RANDOM Performed at [facility], [address] [place], [place] [address]    Special Requests   Final    NONE Performed at [facility], [address] [place],  [address]    Culture 10,000 COLONIES/mL ENTEROBACTER CLOACAE (A)   Final   Report Status 06/12/2018 FINAL  Final   Organism ID, Bacteria ENTEROBACTER CLOACAE (A)  Final      Susceptibility   Enterobacter cloacae - MIC*    CEFAZOLIN >=64 RESISTANT Resistant     CEFTRIAXONE >=64 RESISTANT Resistant     CIPROFLOXACIN <=0.25 SENSITIVE Sensitive     GENTAMICIN <=1 SENSITIVE Sensitive     IMIPENEM <=0.25 SENSITIVE Sensitive     NITROFURANTOIN 64 INTERMEDIATE Intermediate     TRIMETH/SULFA <=20 SENSITIVE Sensitive     PIP/TAZO >=128 RESISTANT Resistant     * 10,000 COLONIES/mL ENTEROBACTER CLOACAE  SARS Coronavirus 2 (CEPHEID - Performed in [place] hospital lab), Hosp Order     Status: None   Collection Time: 06/09/18  1:41 PM  Result Value Ref Range Status   SARS Coronavirus 2 NEGATIVE NEGATIVE Final    Comment: (NOTE) If result is NEGATIVE SARS-CoV-2 target nucleic acids are NOT DETECTED. The SARS-CoV-2 RNA is generally detectable in upper and lower  respiratory specimens during the acute phase of infection. The lowest  concentration of SARS-CoV-2 viral copies this assay can detect is 250  copies / mL. A negative result does not preclude SARS-CoV-2 infection  and should not be used as the sole basis for treatment or other  patient management decisions.  A negative result may occur with  improper specimen collection / handling, submission of specimen other  than nasopharyngeal swab, presence of viral mutation(s) within the  areas targeted by this assay, and inadequate number of viral copies  (<250 copies / mL). A negative result must be combined with clinical  observations, patient history, and epidemiological information. If result is POSITIVE SARS-CoV-2 target nucleic acids are DETECTED. The SARS-CoV-2 RNA is generally detectable in upper and lower  respiratory specimens dur ing the acute phase of infection.  Positive  results are indicative of active infection with SARS-CoV-2.  Clinical  correlation with patient history and other diagnostic  information is  necessary to determine patient infection status.  Positive results do  not rule out bacterial infection or co-infection with other viruses. If result is PRESUMPTIVE POSTIVE SARS-CoV-2 nucleic acids MAY BE PRESENT.   A presumptive positive result was obtained on the submitted specimen  and confirmed on repeat testing.  While 2019 novel coronavirus  (SARS-CoV-2) nucleic acids may be present in the submitted sample  additional confirmatory testing may be necessary for epidemiological  and / or clinical management purposes  to  differentiate between  SARS-CoV-2 and other Sarbecovirus currently known to infect humans.  If clinically indicated additional testing with an alternate test  methodology [phone]) is advised. The SARS-CoV-2 RNA is generally  detectable in upper and lower respiratory sp ecimens during the acute  phase of infection. The expected result is Negative. Fact Sheet for Patients:  StrictlyIdeas.no Fact Sheet for Healthcare Providers: BankingDealers.co.za This test is not yet approved or cleared by the [place] FDA and has been authorized for detection and/or diagnosis of SARS-CoV-2 by FDA under an Emergency Use Authorization (EUA).  This EUA will remain in effect (meaning this test can be used) for the duration of the COVID-19 declaration under Section 564(b)(1) of the Act, 21 U.S.C. section 360bbb-3(b)(1), unless the authorization is terminated or revoked sooner. Performed at [facility], [address] [place], [place] [address]     Coagulation Studies: Recent Labs    06/10/18 0732  LABPROT 15.3*  INR 1.2    Urinalysis: No results for input(s): COLORURINE, LABSPEC, PHURINE, GLUCOSEU, HGBUR, BILIRUBINUR, KETONESUR, PROTEINUR, UROBILINOGEN, NITRITE, LEUKOCYTESUR in the last 72 hours.  Invalid input(s): APPERANCEUR    Imaging: US Renal  Result Date: 06/12/2018 CLINICAL DATA:   Acute renal failure EXAM: RENAL / URINARY TRACT ULTRASOUND COMPLETE COMPARISON:  None. FINDINGS: Right Kidney: Renal measurements: 10.7 x 4.9 x 3.7 cm = volume: 99.8 mL . Echogenicity within normal limits. No mass or hydronephrosis visualized. Left Kidney: Renal measurements: 11.8 x 5.5 x 3.5 cm = volume: 119.0 mL. Echogenicity within normal limits. No mass or hydronephrosis visualized. Bladder: Appears normal for degree of bladder distention. IMPRESSION: No cause for renal failure identified.  Normal study. Electronically Signed   By: Dorise Bullion III M.D   On: 06/12/2018 14:08     Medications:   . sodium chloride    . sodium chloride    . sodium chloride 10 mL/hr at 06/12/18 0400  . sodium chloride Stopped (06/12/18 1309)   . amLODipine  2.5 mg Oral Daily  . [START ON 06/13/2018] furosemide  20 mg Oral Daily  . insulin aspart  0-5 Units Subcutaneous QHS  . insulin aspart  0-9 Units Subcutaneous TID WC  . nystatin cream   Topical BID  . pantoprazole (PROTONIX) IV  40 mg Intravenous Q12H  . pravastatin  10 mg Oral QHS  . tamsulosin  0.4 mg Oral Daily   ipratropium-albuterol, ondansetron (ZOFRAN) IV  Assessment/ Plan:  86 y.o. male with a PMHx of COPD, diabetes mellitus type 2, hyperlipidemia, hypertension, chronic kidney disease stage IV baseline EGFR of 27, who was admitted to [facility] on 06/09/2018 for evaluation of severe anemia and melena.   1.  Acute renal failure/chronic kidney disease stage IV.  Patient's baseline creatinine is 2.2.  Creatinine upon presentation was 2.6.  - Renal US normal.  Cr down to 2.38, continue to monitor renal parameters.   2.  Hyperkalemia.  K down to 4.7, now off veltassa.   3.  Anemia of chronic kidney disease/anemia of blood loss.  Hgb currently 7.6, continue to monitor, no urgent indication for blood transfusion today.    LOS: 3 Arthur [name] 6/10/20203:43 PM

## 2018-06-12 NOTE — Evaluation (Signed)
Physical Therapy Evaluation Patient Details Name: Arthur [name] MRN: [id number] DOB: 1933/03/18 Today's Date: 06/12/2018   History of Present Illness  presented to ER secondary to rectal bleeding; admitted for management of severe anemia (admiting HgB 3.6, now 7.6) related to GIB.  Pending colonoscopy next date  Clinical Impression  Upon evaluation, patient alert and oriented; follows commands.  Mild encouragement for participation with session.  Generally weak and deconditioned due to acute illness, but demonstrates strength and ROM grossly symmetrical and WFL for basic transfers and gait activities.  Denies pain at this time. Currently requiring heavy min assist for bed mobility (reports sleeping in recliner at baseline); min assist +1 for sit/stand, basic transfers and gait (5') with RW.  Demonstrates decreased step height/length bilat; broad BOS with limited balance reactions.  ADditional distance declined (by patient) due to ongoing bowel prep for colonoscopy next date.  Will continue to assess/progress (and update discharge recommendations) as appropriate in subsequent sessions. Would benefit from skilled PT to address above deficits and promote optimal return to PLOF; recommend transition to STR upon discharge from acute hospitalization.     Follow Up Recommendations SNF(will continue to assess and upate as appropriate)    Equipment Recommendations  Rolling walker with 5" wheels    Recommendations for Other Services       Precautions / Restrictions Precautions Precautions: Fall Restrictions Weight Bearing Restrictions: No      Mobility  Bed Mobility Overal bed mobility: Needs Assistance Bed Mobility: Supine to Sit     Supine to sit: Min assist     General bed mobility comments: heavy assist for truncal elevation; reports sleeping in recliner at baseline  Transfers Overall transfer level: Needs assistance Equipment used: Rolling walker (2 wheeled) Transfers: Sit  to/from Stand Sit to Stand: Min assist         General transfer comment: required UE support for lift off, overall balance  Ambulation/Gait Ambulation/Gait assistance: Min assist Gait Distance (Feet): 5 Feet Assistive device: Rolling walker (2 wheeled)       General Gait Details: decreased step height/length bilat; broad BOS with limited balance reactions.  ADditional distance declined (by patient) due to ongoing bowel prep for colonoscopy next date  Stairs            Wheelchair Mobility    Modified Rankin (Stroke Patients Only)       Balance Overall balance assessment: Needs assistance Sitting-balance support: No upper extremity supported;Feet supported Sitting balance-Leahy Scale: Good     Standing balance support: Bilateral upper extremity supported Standing balance-Leahy Scale: Fair                               Pertinent Vitals/Pain Pain Assessment: No/denies pain    Home Living Family/patient expects to be discharged to:: Private residence Living Arrangements: Alone Available Help at Discharge: Family(daughter checks in daily) Type of Home: House Home Access: Stairs to enter Entrance Stairs-Rails: Right;Left;Can reach both Entrance Stairs-Number of Steps: 8 Home Layout: One level Home Equipment: Cane - single point      Prior Function Level of Independence: Independent with assistive device(s)         Comments: Mod indep with SPC ("lately"); daughter provides daily check in and assist with groceries, errands.  Denies recent fall history in previous six monhs     Hand Dominance        Extremity/Trunk Assessment   Upper Extremity Assessment Upper Extremity Assessment: Overall  WFL for tasks assessed    Lower Extremity Assessment Lower Extremity Assessment: Generalized weakness(grossly 4-/5 throughout; cellulitis areas to pre-tibial area bilat, mod edema distal LEs)       Communication   Communication: No difficulties   Cognition Arousal/Alertness: Awake/alert Behavior During Therapy: WFL for tasks assessed/performed Overall Cognitive Status: Within Functional Limits for tasks assessed                                        General Comments      Exercises Other Exercises Other Exercises: Supine LE therex, 1x10, for muscular strength/endurance: ankle pumps, quad sets, heel slides. Other Exercises: Sit/stand with RW, min assist +2 for safety   Assessment/Plan    PT Assessment Patient needs continued PT services  PT Problem List Decreased activity tolerance;Decreased balance;Decreased strength;Decreased range of motion;Decreased mobility;Decreased safety awareness;Cardiopulmonary status limiting activity       PT Treatment Interventions Gait training;Stair training;Functional mobility training;Therapeutic activities;Therapeutic exercise;Balance training;Cognitive remediation;Patient/family education;DME instruction    PT Goals (Current goals can be found in the Care Plan section)  Acute Rehab PT Goals Patient Stated Goal: to go back home PT Goal Formulation: With patient Time For Goal Achievement: 06/06/18 Potential to Achieve Goals: Good    Frequency Min 2X/week   Barriers to discharge Decreased caregiver support      Co-evaluation               AM-PAC PT "6 Clicks" Mobility  Outcome Measure Help needed turning from your back to your side while in a flat bed without using bedrails?: A Little Help needed moving from lying on your back to sitting on the side of a flat bed without using bedrails?: A Little Help needed moving to and from a bed to a chair (including a wheelchair)?: A Little Help needed standing up from a chair using your arms (e.g., wheelchair or bedside chair)?: A Little Help needed to walk in hospital room?: A Little Help needed climbing 3-5 steps with a railing? : A Lot 6 Click Score: 17    End of Session Equipment Utilized During Treatment: Gait  belt Activity Tolerance: Patient tolerated treatment well Patient left: in chair;with call bell/phone within reach;with chair alarm set Nurse Communication: Mobility status PT Visit Diagnosis: Difficulty in walking, not elsewhere classified (R26.2);Muscle weakness (generalized) (M62.81)    Time: 1610-9604 PT Time Calculation (min) (ACUTE ONLY): 21 min   Charges:   PT Evaluation $PT Eval Moderate Complexity: 1 Mod PT Treatments $Therapeutic Exercise: 8-22 mins       Arren Laminack H. Owens Shark, PT, DPT, NCS 06/12/18, 6:08 PM [phone]

## 2018-06-12 NOTE — Progress Notes (Signed)
GI note:  Did not do prep yesterday-refused, willing to do today- complete prep and colonoscopy tomorrow   CBC Latest Ref Rng & Units 06/12/2018 06/11/2018 06/10/2018  WBC 4.0 - 10.5 K/uL 8.2 9.3 -  Hemoglobin 13.0 - 17.0 g/dL 7.6(L) 7.3(L) 7.0(L)  Hematocrit 39.0 - 52.0 % 25.0(L) 23.2(L) -  Platelets 150 - 400 K/uL 191 193 -     Dr Jonathon Bellows MD,MRCP [facility]) Gastroenterology/Hepatology Pager: [phone]

## 2018-06-13 ENCOUNTER — Inpatient Hospital Stay: Payer: Medicare HMO | Admitting: Anesthesiology

## 2018-06-13 ENCOUNTER — Encounter: Payer: Self-pay | Admitting: Gastroenterology

## 2018-06-13 ENCOUNTER — Encounter
Admission: EM | Disposition: A | Discharge status: Home Health | Payer: Self-pay | Source: Home / Self Care | Attending: Internal Medicine

## 2018-06-13 HISTORY — PX: COLONOSCOPY WITH PROPOFOL: SHX5780

## 2018-06-13 LAB — GLUCOSE, CAPILLARY
Glucose-Capillary: 145 mg/dL — ABNORMAL HIGH (ref 70–99)
Glucose-Capillary: 148 mg/dL — ABNORMAL HIGH (ref 70–99)
Glucose-Capillary: 164 mg/dL — ABNORMAL HIGH (ref 70–99)
Glucose-Capillary: 128 mg/dL — ABNORMAL HIGH (ref 70–99)

## 2018-06-13 LAB — CBC
HCT: 23.7 % — ABNORMAL LOW (ref 39.0–52.0)
Hemoglobin: 7.3 g/dL — ABNORMAL LOW (ref 13.0–17.0)
MCH: 29.3 pg (ref 26.0–34.0)
MCHC: 30.8 g/dL (ref 30.0–36.0)
MCV: 95.2 fL (ref 80.0–100.0)
Platelets: 172 10*3/uL (ref 150–400)
RBC: 2.49 MIL/uL — ABNORMAL LOW (ref 4.22–5.81)
RDW: 18.9 % — ABNORMAL HIGH (ref 11.5–15.5)
WBC: 7.6 10*3/uL (ref 4.0–10.5)
nRBC: 0.4 % — ABNORMAL HIGH (ref 0.0–0.2)

## 2018-06-13 LAB — BASIC METABOLIC PANEL
Anion gap: 6 (ref 5–15)
BUN: 41 mg/dL — ABNORMAL HIGH (ref 8–23)
CO2: 31 mmol/L (ref 22–32)
Calcium: 7.9 mg/dL — ABNORMAL LOW (ref 8.9–10.3)
Chloride: 106 mmol/L (ref 98–111)
Creatinine, Ser: 2.01 mg/dL — ABNORMAL HIGH (ref 0.61–1.24)
GFR calc Af Amer: 35 mL/min — ABNORMAL LOW (ref >60)
GFR calc non Af Amer: 30 mL/min — ABNORMAL LOW (ref >60)
Glucose, Bld: 140 mg/dL — ABNORMAL HIGH (ref 70–99)
Potassium: 4.4 mmol/L (ref 3.5–5.1)
Sodium: 143 mmol/L (ref 135–145)

## 2018-06-13 SURGERY — COLONOSCOPY WITH PROPOFOL
Anesthesia: General

## 2018-06-13 MED ORDER — PROPOFOL 500 MG/50ML IV EMUL
INTRAVENOUS | Status: DC | PRN
  Administered 2018-06-13: 125 ug/kg/min via INTRAVENOUS

## 2018-06-13 MED ORDER — SODIUM CHLORIDE 0.9 % IV SOLN
INTRAVENOUS | Status: DC
  Administered 2018-06-13: 12:00:00 via INTRAVENOUS

## 2018-06-13 MED ORDER — PROPOFOL 10 MG/ML IV BOLUS
INTRAVENOUS | Status: DC | PRN
  Administered 2018-06-13: 50 mg via INTRAVENOUS

## 2018-06-13 MED ORDER — MORPHINE SULFATE (PF) 2 MG/ML IV SOLN
2.0000 mg | Freq: Once | INTRAVENOUS | Status: DC
  Filled 2018-06-13: qty 1

## 2018-06-13 NOTE — H&P (Signed)
Jonathon Bellows, MD [address], [place], [place], [place], [address] [address], [place], [place], [place], [address] Phone: [phone]  Fax: [phone]  Primary Care Physician:  Baxter Hire, MD   Pre-Procedure History & Physical: HPI:  Arthur [name] is a 85 y.o. male is here for an colonoscopy.   Past Medical History:  Diagnosis Date  . COPD (chronic obstructive pulmonary disease) ([place])   . Diabetes mellitus without complication ([place])   . Hyperlipidemia   . Hypertension     Past Surgical History:  Procedure Laterality Date  . COLONOSCOPY WITH PROPOFOL N/A 06/11/2018   Procedure: COLONOSCOPY WITH PROPOFOL;  Surgeon: Jonathon Bellows, MD;  Location: [facility] ENDOSCOPY;  Service: Gastroenterology;  Laterality: N/A;  . ESOPHAGOGASTRODUODENOSCOPY Left 06/10/2018   Procedure: ESOPHAGOGASTRODUODENOSCOPY (EGD);  Surgeon: Jonathon Bellows, MD;  Location: [facility] ENDOSCOPY;  Service: Gastroenterology;  Laterality: Left;    Prior to Admission medications   Medication Sig Start Date End Date Taking? Authorizing Provider  albuterol (VENTOLIN HFA) 108 (90 Base) MCG/ACT inhaler Inhale 2 puffs into the lungs every 6 (six) hours as needed for wheezing or shortness of breath. 05/27/18  Yes Demetrios Loll, MD  amLODipine (NORVASC) 2.5 MG tablet Take 2.5 mg by mouth daily. 04/26/18  Yes [provider]  furosemide (LASIX) 20 MG tablet Take 20 mg by mouth daily as needed for edema. 04/25/18  Yes [provider]  guaiFENesin-dextromethorphan (ROBITUSSIN DM) 100-10 MG/5ML syrup Take 5 mLs by mouth every 4 (four) hours as needed for cough. 05/27/18  Yes Demetrios Loll, MD  insulin aspart (NOVOLOG) 100 UNIT/ML injection Inject 20 Units into the skin 3 (three) times daily before meals.   Yes [provider]  ipratropium-albuterol (DUONEB) 0.5-2.5 (3) MG/3ML SOLN Take 3 mLs by nebulization every 6 (six) hours as needed. 05/28/18  Yes Mody, Sital, MD  LEVEMIR 100 UNIT/ML injection Inject 0.12  mLs (12 Units total) into the skin daily. Patient taking differently: Inject 40 Units into the skin at bedtime.  05/27/18  Yes Demetrios Loll, MD  nystatin cream (MYCOSTATIN) Apply topically 2 (two) times daily. 05/28/18  Yes Mody, Ulice Bold, MD  pravastatin (PRAVACHOL) 10 MG tablet Take 10 mg by mouth at bedtime. 04/25/18  Yes [provider]  tamsulosin (FLOMAX) 0.4 MG CAPS capsule Take 0.4 mg by mouth daily. 04/25/18  Yes [provider]    Allergies as of 06/09/2018 - Review Complete 06/09/2018  Allergen Reaction Noted  . Codeine Other (See Comments) 08/06/2013    History reviewed. No pertinent family history.  Social History   Socioeconomic History  . Marital status: Widowed    Spouse name: Not on file  . Number of children: Not on file  . Years of education: Not on file  . Highest education level: Not on file  Occupational History  . Not on file  Social Needs  . Financial resource strain: Not on file  . Food insecurity    Worry: Not on file    Inability: Not on file  . Transportation needs    Medical: Not on file    Non-medical: Not on file  Tobacco Use  . Smoking status: Former Smoker    Quit date: 11/04/1982    Years since quitting: 35.6  . Smokeless tobacco: Current User  Substance and Sexual Activity  . Alcohol use: Yes    Comment: 1/5 /wk  . Drug use: No  . Sexual activity: Not on file  Lifestyle  . Physical activity  Days per week: Not on file    Minutes per session: Not on file  . Stress: Not on file  Relationships  . Social Herbalist on phone: Not on file    Gets together: Not on file    Attends religious service: Not on file    Active member of club or organization: Not on file    Attends meetings of clubs or organizations: Not on file    Relationship status: Not on file  . Intimate partner violence    Fear of current or ex partner: Not on file    Emotionally abused: Not on file    Physically abused: Not on file    Forced  sexual activity: Not on file  Other Topics Concern  . Not on file  Social History Narrative  . Not on file    Review of Systems: See HPI, otherwise negative ROS  Physical Exam: BP (!) 152/58 (BP Location: Left Arm)   Pulse 72   Temp 98.6 F (37 C) (Oral)   Resp 17   Ht 5\' 11"  (1.803 m)   Wt 115.7 kg   SpO2 100%   BMI 35.57 kg/m  General:   Alert,  pleasant and cooperative in NAD Head:  Normocephalic and atraumatic. Neck:  Supple; no masses or thyromegaly. Lungs:  Clear throughout to auscultation, normal respiratory effort.    Heart:  +S1, +S2, Regular rate and rhythm, No edema. Abdomen:  Soft, nontender and nondistended. Normal bowel sounds, without guarding, and without rebound.   Neurologic:  Alert and  oriented x4;  grossly normal neurologically.  Impression/Plan: Arthur [name] is here for an colonoscopy to be performed for melena Risks, benefits, limitations, and alternatives regarding  colonoscopy have been reviewed with the patient.  Questions have been answered.  All parties agreeable.   Jonathon Bellows, MD  06/13/2018, 12:14 PM

## 2018-06-13 NOTE — Transfer of Care (Signed)
Immediate Anesthesia Transfer of Care Note  Patient: MAJOR SANTERRE  Procedure(s) Performed: COLONOSCOPY WITH PROPOFOL (N/A )  Patient Location: PACU  Anesthesia Type:General  Level of Consciousness: drowsy  Airway & Oxygen Therapy: Patient Spontanous Breathing and Patient connected to nasal cannula oxygen  Post-op Assessment: Report given to RN and Post -op Vital signs reviewed and stable  Post vital signs: Reviewed and stable  Last Vitals:  Vitals Value Taken Time  BP 125/53 06/13/18 1253  Temp 36.2 C 06/13/18 1253  Pulse 91 06/13/18 1257  Resp 21 06/13/18 1257  SpO2 96 % 06/13/18 1257  Vitals shown include unvalidated device data.  Last Pain:  Vitals:   06/13/18 1253  TempSrc: Tympanic  PainSc: Asleep         Complications: No apparent anesthesia complications

## 2018-06-13 NOTE — NC FL2 (Signed)
[place] LEVEL OF CARE SCREENING TOOL     IDENTIFICATION  Patient Name: Arthur [name] Birthdate: 1933/09/07 Sex: male Admission Date (Current Location): 06/09/2018  [facility] and [place] Number:  Engineering geologist and Address:         Provider Number: [id number]  Attending Physician Name and Address:  Mayo, Pete Pelt, MD  Relative Name and Phone Number:       Current Level of Care: Hospital Recommended Level of Care: [place] Prior Approval Number:    Date Approved/Denied:   PASRR Number: [id number]  Discharge Plan: SNF    Current Diagnoses: Patient Active Problem List   Diagnosis Date Noted  . GI bleeding 06/09/2018  . Sepsis ([place]) 05/23/2018  . Cellulitis of right lower extremity 05/22/2018  . DIABETES MELLITUS 10/24/2006  . OBESITY 10/24/2006  . HYPERTENSION 10/24/2006  . ALLERGIC RHINITIS 10/24/2006  . CHRONIC OBSTRUCTIVE PULMONARY DISEASE, MODERATE 10/24/2006  . TOBACCO ABUSE, HX OF 10/24/2006    Orientation RESPIRATION BLADDER Height & Weight     Self, Time, Situation, Place  Normal Continent Weight: 255 lb (115.7 kg) Height:  5\' 11"  (180.3 cm)  BEHAVIORAL SYMPTOMS/MOOD NEUROLOGICAL BOWEL NUTRITION STATUS      Continent Diet(Diet: NPO to be advanced.)  AMBULATORY STATUS COMMUNICATION OF NEEDS Skin   Extensive Assist Verbally Normal                       Personal Care Assistance Level of Assistance  Bathing, Feeding, Dressing Bathing Assistance: Limited assistance Feeding assistance: Independent Dressing Assistance: Limited assistance     Functional Limitations Info  Sight, Hearing, Speech Sight Info: Adequate Hearing Info: Adequate Speech Info: Adequate    SPECIAL CARE FACTORS FREQUENCY  PT (By licensed PT), OT (By licensed OT)     PT Frequency: 5 OT Frequency: 5            Contractures      Additional Factors Info  Code Status, Allergies Code Status Info: DNR Allergies Info: Codeine           Current Medications (06/13/2018):  This is the current hospital active medication list Current Facility-Administered Medications  Medication Dose Route Frequency Provider Last Rate Last Dose  . 0.9 %  sodium chloride infusion  10 mL/hr Intravenous Once Earleen Newport, MD      . 0.9 %  sodium chloride infusion   Intravenous Continuous Jonathon Bellows, MD      . 0.9 %  sodium chloride infusion   Intravenous Continuous Jonathon Bellows, MD 10 mL/hr at 06/12/18 0400    . 0.9 %  sodium chloride infusion   Intravenous Continuous Jonathon Bellows, MD   Stopped at 06/12/18 1309  . amLODipine (NORVASC) tablet 2.5 mg  2.5 mg Oral Daily Lang Snow, NP   2.5 mg at 06/12/18 1042  . furosemide (LASIX) tablet 20 mg  20 mg Oral Daily Mayo, Pete Pelt, MD      . insulin aspart (novoLOG) injection 0-5 Units  0-5 Units Subcutaneous QHS Sela Hua, MD   2 Units at 06/10/18 2204  . insulin aspart (novoLOG) injection 0-9 Units  0-9 Units Subcutaneous TID WC Mayo, Pete Pelt, MD   1 Units at 06/13/18 [phone]  . ipratropium-albuterol (DUONEB) 0.5-2.5 (3) MG/3ML nebulizer solution 3 mL  3 mL Nebulization Q6H PRN Lang Snow, NP      . morphine 2 MG/ML injection 2 mg  2 mg Intravenous Once Willis,  Shanon Brow, MD      . nystatin cream (MYCOSTATIN)   Topical BID Lang Snow, NP      . ondansetron [facility]) injection 4 mg  4 mg Intravenous Q4H PRN Mansy, Jan A, MD   4 mg at 06/09/18 2347  . pantoprazole (PROTONIX) injection 40 mg  40 mg Intravenous Q12H Mayo, Pete Pelt, MD   40 mg at 06/12/18 2140  . pravastatin (PRAVACHOL) tablet 10 mg  10 mg Oral QHS Lang Snow, NP   10 mg at 06/12/18 2138  . tamsulosin (FLOMAX) capsule 0.4 mg  0.4 mg Oral Daily Lang Snow, NP   0.4 mg at 06/12/18 1043     Discharge Medications: Please see discharge summary for a list of discharge medications.  Relevant Imaging Results:  Relevant Lab Results:   Additional Information SSN:  [id number]  Mauriah Mcmillen, Veronia Beets, LCSW

## 2018-06-13 NOTE — Anesthesia Preprocedure Evaluation (Signed)
Anesthesia Evaluation  Patient identified by MRN, date of birth, ID band Patient awake    Reviewed: Allergy & Precautions, H&P , NPO status , reviewed documented beta blocker date and time   Airway Mallampati: III  TM Distance: >3 FB Neck ROM: full    Dental  (+) Edentulous Upper, Edentulous Lower   Pulmonary COPD, former smoker,    Pulmonary exam normal        Cardiovascular hypertension, Normal cardiovascular exam     Neuro/Psych PSYCHIATRIC DISORDERS    GI/Hepatic   Endo/Other  diabetes  Renal/GU      Musculoskeletal   Abdominal   Peds  Hematology  (+) Blood dyscrasia, anemia , GI Bleed   Anesthesia Other Findings Past Medical History: No date: COPD (chronic obstructive pulmonary disease) (HCC) No date: Diabetes mellitus without complication (HCC) No date: Hyperlipidemia No date: Hypertension  Past Surgical History: 06/11/2018: COLONOSCOPY WITH PROPOFOL; N/A     Comment:  Procedure: COLONOSCOPY WITH PROPOFOL;  Surgeon: Jonathon Bellows, MD;  Location[facility] ENDOSCOPY;  Service:               Gastroenterology;  Laterality: N/A; 06/10/2018: ESOPHAGOGASTRODUODENOSCOPY; Left     Comment:  Procedure: ESOPHAGOGASTRODUODENOSCOPY (EGD);  Surgeon:               Jonathon Bellows, MD;  Location: [facility] ENDOSCOPY;  Service:               Gastroenterology;  Laterality: Left;  BMI    Body Mass Index: 35.57 kg/m      Reproductive/Obstetrics                             Anesthesia Physical Anesthesia Plan  ASA: III  Anesthesia Plan: General   Post-op Pain Management:    Induction: Intravenous  PONV Risk Score and Plan: 2 and Treatment may vary due to age or medical condition and TIVA  Airway Management Planned: Nasal Cannula and Natural Airway  Additional Equipment:   Intra-op Plan:   Post-operative Plan:   Informed Consent: I have reviewed the patients History and Physical,  chart, labs and discussed the procedure including the risks, benefits and alternatives for the proposed anesthesia with the patient or authorized representative who has indicated his/her understanding and acceptance.     Dental Advisory Given  Plan Discussed with: CRNA  Anesthesia Plan Comments:         Anesthesia Quick Evaluation

## 2018-06-13 NOTE — Anesthesia Postprocedure Evaluation (Signed)
Anesthesia Post Note  Patient: JUANA MONTINI  Procedure(s) Performed: COLONOSCOPY WITH PROPOFOL (N/A )  Patient location during evaluation: Endoscopy Anesthesia Type: General Level of consciousness: awake and alert Pain management: pain level controlled Vital Signs Assessment: post-procedure vital signs reviewed and stable Respiratory status: spontaneous breathing, nonlabored ventilation, respiratory function stable and patient connected to nasal cannula oxygen Cardiovascular status: blood pressure returned to baseline and stable Postop Assessment: no apparent nausea or vomiting Anesthetic complications: no     Last Vitals:  Vitals:   06/13/18 1253 06/13/18 1303  BP: (!) 125/53 (!) 134/57  Pulse:  94  Resp: 16 (!) 25  Temp: (!) 36.2 C   SpO2: 91% 90%    Last Pain:  Vitals:   06/13/18 1303  TempSrc:   PainSc: Asleep                 Alphonsus Sias

## 2018-06-13 NOTE — TOC Progression Note (Signed)
Transition of Care [facility]) - Progression Note    Patient Details  Name: Arthur [name] MRN: [id number] Date of Birth: [birth year]  [facility]) CM/SW Contact  Shavone Nevers, Lenice Llamas Phone Number: [phone]  06/13/2018, 4:28 PM  Clinical Narrative:   Patient's daughter [name] called Clinical Social Worker (CSW) back and stated that she would try to talk to patient about going to SNF and will follow up with CSW tomorrow. CSW explained to [name] that patient is adamantly refusing SNF. Per [name] she works full time and can't provide 24/7 assistance to patient. CSW will continue to follow and assist as needed.     Expected Discharge Plan: [place] Barriers to Discharge: Continued Medical Work up  Expected Discharge Plan and Services Expected Discharge Plan: [place] In-house Referral: Clinical Social Work Discharge Planning Services: CM Consult   Living arrangements for the past 2 months: [place] Expected Discharge Date: 06/11/18                         HH Arranged: PT [place]: [place] ([place]) Date [place]: 06/13/18 Time [place]: 1200 Representative spoke with at [place]: [place] ([place]) Interventions    Readmission Risk Interventions Readmission Risk Prevention Plan 05/26/2018  Transportation Screening Complete  Home Care Screening Complete  Medication Review (RN CM) Complete  Some recent data might be hidden

## 2018-06-13 NOTE — TOC Initial Note (Signed)
Transition of Care [facility]) - Initial/Assessment Note    Patient Details  Name: Arthur [name] MRN: [id number] Date of Birth: 08-Jun-1933  Transition of Care [facility]) CM/SW Contact:    Osha Errico, Lenice Llamas Phone Number: [phone]  06/13/2018, 3:22 PM  Clinical Narrative: Clinical Social Worker ([place]) reviewed chart and noted that PT is recommending SNF. CSW met with patient alone at bedside and made him aware of above. Patient was alert and oriented X4 and was laying in the bed. CSW introduced self and explained role of CSW department. Per patient he lives in [place] alone and his daughter Arthur [name] is her HPOA. CSW explained SNF process and patient adamantly refused SNF. CSW explained the risk of going home and the benefits of going to SNF. CSW asked patient how he can get to the bathroom and he stated that he will use his lift chair to get to his walker in order to get to the bathroom. Patient continued to refuse SNF. Patient reported that he already has [place] coming out to his house and wants that to continue. Patient gave CSW permission to contact his daughter Arthur [name]. CSW left Arthur [name] a Advertising account executive. [place] representative conformed that patient is open with [place]. CSW will continue to follow and assist as needed.        Expected Discharge Plan: [place] Barriers to Discharge: Continued Medical Work up   Patient Goals and CMS Choice        Expected Discharge Plan and Services Expected Discharge Plan: [place] In-house Referral: Clinical Social Work Discharge Planning Services: CM Consult   Living arrangements for the past 2 months: [place] Expected Discharge Date: 06/11/18                         HH Arranged: PT [place] Agency: [place] ([place]) Date HH Agency Contacted: 06/13/18 Time [place]: 1200 Representative spoke with at [place]: [place]  Arrangements/Services Living arrangements for the past 2 months: [place] Lives with:: Self Patient language and need for interpreter reviewed:: No Do you feel safe going back to the place where you live?: Yes      Need for Family Participation in Patient Care: No (Comment) Care giver support system in place?: Yes (comment) Current home services: Home PT([facility]) Criminal Activity/Legal Involvement Pertinent to Current Situation/Hospitalization: No - Comment as needed  Activities of Daily Living Home Assistive Devices/Equipment: None ADL Screening (condition at time of admission) Patient's cognitive ability adequate to safely complete daily activities?: Yes Is the patient deaf or have difficulty hearing?: No Does the patient have difficulty seeing, even when wearing glasses/contacts?: No Does the patient have difficulty concentrating, remembering, or making decisions?: No Patient able to express need for assistance with ADLs?: Yes Does the patient have difficulty dressing or bathing?: No Independently performs ADLs?: Yes (appropriate for developmental age) Does the patient have difficulty walking or climbing stairs?: No Weakness of Legs: Both Weakness of Arms/Hands: None  Permission Sought/Granted Permission sought to share information with : Family Supports Permission granted to share information with : Yes, Verbal Permission Granted  Share Information with NAME: Arthur [name]           Emotional Assessment Appearance:: Appears stated age Attitude/Demeanor/Rapport: Guarded Affect (typically observed): Apprehensive Orientation: : Oriented to Self, Oriented to Place, Oriented to  Time, Oriented to Situation Alcohol /  Substance Use: Not Applicable Psych Involvement: No (comment)  Admission diagnosis:  Weakness [R53.1] Anemia due to gastrointestinal blood loss [D50.0] Patient Active Problem List   Diagnosis Date Noted  . GI bleeding 06/09/2018  . Sepsis ([place])  05/23/2018  . Cellulitis of right lower extremity 05/22/2018  . DIABETES MELLITUS 10/24/2006  . OBESITY 10/24/2006  . HYPERTENSION 10/24/2006  . ALLERGIC RHINITIS 10/24/2006  . CHRONIC OBSTRUCTIVE PULMONARY DISEASE, MODERATE 10/24/2006  . TOBACCO ABUSE, HX OF 10/24/2006   PCP:  Baxter Hire, MD Pharmacy:   [facility] [address] [place] [address] [place] [address] Phone: [phone] Fax: [phone]     Social Determinants of Health (SDOH) Interventions    Readmission Risk Interventions Readmission Risk Prevention Plan 05/26/2018  Transportation Screening Complete  Home Care Screening Complete  Medication Review (RN CM) Complete  Some recent data might be hidden

## 2018-06-13 NOTE — Progress Notes (Signed)
PT Cancellation Note  Treatment session not completed; patient at procedure or test/unavailable.  Patient currently off unit for diagnostic testing.  Will re-attempt at later time/date as medically appropriate and available.  Duy Lemming H. Owens Shark, PT, DPT, NCS 06/13/18, 1:19 PM [phone]

## 2018-06-13 NOTE — Op Note (Signed)
[facility] Gastroenterology Patient Name: Arthur [name] Procedure Date: 06/13/2018 12:19 PM MRN: [id number] Account #: [id number] Date of Birth: 1933-11-25 Admit Type: Inpatient Age: 85 Room: [facility] ENDO ROOM 4 Gender: Male Note Status: Finalized Procedure:            Colonoscopy Indications:          Melena Providers:            Jonathon Bellows MD, MD Referring MD:         No Local Md, MD (Referring MD) Medicines:            Monitored Anesthesia Care Complications:        No immediate complications. Procedure:            Pre-Anesthesia Assessment:                       - Prior to the procedure, a History and Physical was                        performed, and patient medications, allergies and                        sensitivities were reviewed. The patient's tolerance of                        previous anesthesia was reviewed.                       - The risks and benefits of the procedure and the                        sedation options and risks were discussed with the                        patient. All questions were answered and informed                        consent was obtained.                       - ASA Grade Assessment: III - A patient with severe                        systemic disease.                       After obtaining informed consent, the colonoscope was                        passed under direct vision. Throughout the procedure,                        the patient's blood pressure, pulse, and oxygen                        saturations were monitored continuously. The                        Colonoscope was introduced through the anus and                        advanced to  the the cecum, identified by the                        appendiceal orifice, IC valve and transillumination.                        The colonoscopy was performed with moderate difficulty                        due to inadequate bowel prep. The patient tolerated the    procedure well. The quality of the bowel preparation                        was inadequate. Findings:      The perianal and digital rectal examinations were normal.      a large amount of liquid stool was found in the entire colon which was       brown and yellow in color, no bright red or dark blood seen .Prep       adequate to r/o active bleeding in the colon but not adequate to look       for polyps or non bleeding AVM's      No additional abnormalities were found on retroflexion. Impression:           - Preparation of the colon was inadequate.                       - Stool in the entire examined colon.                       - No specimens collected. Recommendation:       - Return patient to hospital ward for ongoing care.                       - 1. Advance diet as tolerated                       2. Home if Hb stable                       3. Outpatient caopsule study Procedure Code(s):    --- Professional ---                       [phone], Colonoscopy, flexible; diagnostic, including                        collection of specimen(s) by brushing or washing, when                        performed (separate procedure) Diagnosis Code(s):    --- Professional ---                       K92.1, Melena (includes Hematochezia) CPT copyright 2019 American Medical Association. All rights reserved. The codes documented in this report are preliminary and upon coder review may  be revised to meet current compliance requirements. Jonathon Bellows, MD Jonathon Bellows MD, MD 06/13/2018 12:50:12 PM This report has been signed electronically. Number of Addenda: 0 Note Initiated On: 06/13/2018 12:19 PM Scope Withdrawal Time: 0 hours 9 minutes 5 seconds  Total Procedure Duration: 0 hours 18 minutes 41 seconds  Estimated Blood Loss: Estimated blood loss: none.      [facility]

## 2018-06-13 NOTE — Anesthesia Post-op Follow-up Note (Signed)
Anesthesia QCDR form completed.        

## 2018-06-13 NOTE — Progress Notes (Signed)
[name]    MR#:  [id number]  DATE OF BIRTH:  [birth year]  SUBJECTIVE:   Chief Complaint  Patient presents with  . Blood Infection   Patient is seen at the bedside. NAD. Overall he feels his condition is gradually improving but still with mild SOB requiring supplemental oxygen. No new episode of melena reported. Voices no new complaints.    S/p EGD 6/8, pending colonoscopy today.  REVIEW OF SYSTEMS:  Review of Systems  Constitutional: Negative for chills, fever, malaise/fatigue and weight loss.  HENT: Negative for congestion, hearing loss and sore throat.   Eyes: Negative for blurred vision and double vision.  Respiratory: Positive for shortness of breath. Negative for cough and wheezing.   Cardiovascular: Positive for orthopnea and leg swelling. Negative for chest pain and palpitations.  Gastrointestinal: Positive for melena. Negative for abdominal pain, diarrhea, nausea and vomiting.  Genitourinary: Negative for dysuria and urgency.  Musculoskeletal: Negative for myalgias.  Skin: Negative for rash.  Neurological: Negative for dizziness, sensory change, speech change, focal weakness and headaches.  Psychiatric/Behavioral: Negative for depression.   DRUG ALLERGIES:   Allergies  Allergen Reactions  . Codeine Other (See Comments)    Constipation   VITALS:  Blood pressure (!) 152/58, pulse 72, temperature 98.6 F (37 C), temperature source Oral, resp. rate 17, height 5\' 11"  (1.803 m), weight 115.7 kg, SpO2 100 %. PHYSICAL EXAMINATION:   GENERAL:  85 y.o.-year-old patient lying in the bed with no acute distress.  EYES: Pupils equal, round, reactive to light and accommodation. No scleral icterus. Extraocular muscles intact.  HEENT: Head atraumatic, normocephalic. Oropharynx and nasopharynx clear.  NECK:  Supple, no jugular venous distention. No thyroid enlargement, no tenderness.  LUNGS: Normal breath  sounds bilaterally, no wheezing, rales,rhonchi or crepitation. No use of accessory muscles of respiration.  CARDIOVASCULAR: S1, S2 normal. No murmurs, rubs, or gallops.  ABDOMEN: Distended and non-tender. Bowel sounds present. No organomegaly or mass.  EXTREMITIES: Bilateral pitting edema of lower extremities with anterior erythema, tenderness still present but much improved from previous.  No cyanosis, or clubbing. No rash or lesions. + pedal pulses MUSCULOSKELETAL: Normal bulk, and power was 5+ grip and elbow, knee, and ankle flexion and extension bilaterally.  NEUROLOGIC:Alert and oriented x 3. CN 2-12 intact. Sensation to light touch and cold stimuli intact bilaterally. Finger to nose nl. Babinski is downgoing. DTR's (biceps, patellar, and achilles) 2+ and symmetric throughout. Gait not tested due to safety concern. PSYCHIATRIC: The patient is alert and oriented x 3.  SKIN: As below  Left Leg    Right Leg    DATA REVIEWED:  LABORATORY PANEL:  Male CBC Recent Labs  Lab 06/13/18 0329  WBC 7.6  HGB 7.3*  HCT 23.7*  PLT 172   ------------------------------------------------------------------------------------------------------------------ Chemistries  Recent Labs  Lab 06/09/18 1126  06/11/18 0337  06/13/18 0329  NA 137   < > 139   < > 143  K 4.5   < > 4.5   < > 4.4  CL 103   < > 103   < > 106  CO2 25   < > 30   < > 31  GLUCOSE 177*   < > 159*   < > 140*  BUN 72*   < > 64*   < > 41*  CREATININE 2.51*   < > 2.66*   < > 2.01*  CALCIUM 7.8*   < >  7.8*   < > 7.9*  MG  --   --  2.1  --   --   AST 21  --   --   --   --   ALT 17  --   --   --   --   ALKPHOS 33*  --   --   --   --   BILITOT 0.7  --   --   --   --    < > = values in this interval not displayed.   RADIOLOGY:  US Renal  Result Date: 06/12/2018 CLINICAL DATA:  Acute renal failure EXAM: RENAL / URINARY TRACT ULTRASOUND COMPLETE COMPARISON:  None. FINDINGS: Right Kidney: Renal measurements: 10.7 x 4.9 x 3.7  cm = volume: 99.8 mL . Echogenicity within normal limits. No mass or hydronephrosis visualized. Left Kidney: Renal measurements: 11.8 x 5.5 x 3.5 cm = volume: 119.0 mL. Echogenicity within normal limits. No mass or hydronephrosis visualized. Bladder: Appears normal for degree of bladder distention. IMPRESSION: No cause for renal failure identified.  Normal study. Electronically Signed   By: Dorise Bullion III M.D   On: 06/12/2018 14:08   ASSESSMENT AND PLAN:   85 y.o. male 85 y.o. male COPD, hypertension, diabetes mellitus, hyperlipidemia, anemia and chronic kidney disease, and chronic kidney disease stage III presenting to the ED with generalized weakness.  He is found to have hemoglobin of 3.6 on admission.  1. GI Bleed = Likely Lower as patient presenting with rectal bleed without evidence hemodynamic instability - CT abdomen and pelvis shows 1.8 cm gallstone otherwise no acute finding - s/p EGD 6/8 with no significant finding or source of bleeding - Plan for colonoscopy today - Continue pantoprazole 40 mg IV BID - Hold NSAIDs, steroids, ASA - GI input appreciated , will continue to follow with them  2. Severe anemia (Hgb 3.6 on admission) -likely secondary to GI bleed as above s/p PRBC for total of 4units - Hgb 7.3 today - Continue to monitor H&H - Transfuse PRN Hgb<7  3. DM: His sugars have not been well-controlled on current regimen  - Hold home meds - Continue sliding scale insulin coverage  - ADA 2100 calorie diet  - FS BS qac and qhs   4.  Acute on chronic kidney disease -BUN and creatinine elevated on admission - Continue to monitor renal function - renal ultrasound shows no abnormality or cause for renal failure  - Nephrology input appreciated  5. Hyperkalemia - stable at 4.4 - continue to monitor  6.  Bilateral lower extremity cellulitis with group B strep bacteremia - significant improvement - Previous work-up with ultrasound negative for DVT - Recently treated  with broad-spectrum antibiotic coverage with cefepime and vancomycin - blood cultures shows no growth so far - Completed amoxicillin  7.  COPD= no evidence of exacerbation  - DuoNeb every 6 hours  - Incentive spirometry every hour while awake  - Patient has O2 per nasal cannula at 2 L/min  8. HLD  + Goal LDL<70 -Pravastatin 10 mg  9. HTN- slightly elevated + Goal BP <140/90 - Continue amlodipine   Spoke with patient's daughter [name] today 06/13/18 and updated her on patient's progress and plan of care.  All the records are reviewed and case discussed with Care Management/Social Worker. Management plans discussed with the patient, family and they are in agreement.  CODE STATUS: DNR  TOTAL TIME TAKING CARE OF THIS PATIENT: 36 minutes.   More than 50%  of the time was spent in counseling/coordination of care: YES  POSSIBLE D/C IN 1-2 DAYS, DEPENDING ON CLINICAL CONDITION.   on 06/13/2018 at 11:39 AM  This patient was staffed with Dr. Valetta Fuller, [facility]  who personally evaluated patient, reviewed documentation and agreed with assessment and plan of care as above.  Rufina Falco, DNP, FNP-BC [facility] Nurse Practitioner   Between 7am to 6pm - Pager [phone]  After 6pm go to [email or web address] - Proofreader  [facility]  Office  [phone]  CC: Primary care physician; Baxter Hire, MD  Note: This dictation was prepared with Dragon dictation along with smaller phrase technology. Any transcriptional errors that result from this process are unintentional.

## 2018-06-14 LAB — CBC
HCT: 24.8 % — ABNORMAL LOW (ref 39.0–52.0)
Hemoglobin: 7.5 g/dL — ABNORMAL LOW (ref 13.0–17.0)
MCH: 29.4 pg (ref 26.0–34.0)
MCHC: 30.2 g/dL (ref 30.0–36.0)
MCV: 97.3 fL (ref 80.0–100.0)
Platelets: 180 10*3/uL (ref 150–400)
RBC: 2.55 MIL/uL — ABNORMAL LOW (ref 4.22–5.81)
RDW: 18.3 % — ABNORMAL HIGH (ref 11.5–15.5)
WBC: 26.8 10*3/uL — ABNORMAL HIGH (ref 4.0–10.5)
nRBC: 0.1 % (ref 0.0–0.2)

## 2018-06-14 LAB — CULTURE, BLOOD (ROUTINE X 2)
Culture: NO GROWTH
Culture: NO GROWTH

## 2018-06-14 LAB — GLUCOSE, CAPILLARY
Glucose-Capillary: 149 mg/dL — ABNORMAL HIGH (ref 70–99)
Glucose-Capillary: 183 mg/dL — ABNORMAL HIGH (ref 70–99)

## 2018-06-14 MED ORDER — PANTOPRAZOLE SODIUM 40 MG PO TBEC: 40.0000 mg | DELAYED_RELEASE_TABLET | Freq: Two times a day (BID) | ORAL | 0 refills | Status: DC

## 2018-06-14 NOTE — Progress Notes (Signed)
Patient continues to exhibit signs of hypercapnia associated with chronic respiratory failure secondary to severe COPD. Patient requires the use of NIV both QHS and daytime to help with exacerbation periods. The use of the NIV will treat patient's high PCO2 levels and can reduce risk of exacerbations and future hospitalizations when used at night and during the day. Pt will need these advanced settings in conjunction with her current medication regimen; BIPAP is not an option due to its functional limitations and the severity of the patient's condition. Failure to have NIV available for use over a 24 hour period could lead to death.    

## 2018-06-14 NOTE — Progress Notes (Signed)
Pt demonstrated great effort with bedside spirometry. Tolerated well with no adverse reactions.     FEV 1=  48%    FEV/FVC = 59%

## 2018-06-14 NOTE — Progress Notes (Signed)
Arthur [name] to be D/C'd per MD order. Discussed with the patient and all questions fully answered. ? VSS, Skin clean, dry and intact without evidence of skin break down, no evidence of skin tears noted. ? IV catheter discontinued intact. Site without signs and symptoms of complications. Dressing and pressure applied. ? An After Visit Summary was printed and given to the patient. Patient informed where to pickup prescriptions. ? D/c education completed with patient/family including follow up instructions, medication list, d/c activities limitations if indicated, with other d/c instructions as indicated by MD - patient able to verbalize understanding, all questions fully answered.  ? Patient instructed to return to ED, call 911, or call MD for any changes in condition.  ? Patient to be escorted via [place], and D/C home via private auto.

## 2018-06-14 NOTE — TOC Transition Note (Signed)
Transition of Care [facility]) - CM/SW Discharge Note   Patient Details  Name: Arthur [name] MRN: [id number] Date of Birth: 03/21/1933  Transition of Care [facility]) CM/SW Contact:  Naitik Hermann, Lenice Llamas Phone Number: [phone]  06/14/2018, 11:42 AM   Clinical Narrative:   Clinical Social Worker (CSW) contacted patient's daughter Arthur [name] to discuss D/C plan. Per Arthur [name] patient said that he might go to SNF and she prefers [facility]. CSW met with patient and made him aware of above. CSW explained to patient that he doesn't have to go to the facility he had a bad experience in and can try a different facility. Patient continued to refuse SNF and stated that he is going home today. Per patient his daughter Arthur [name] will provide transportation home today.  CSW contacted patient's daughter Arthur [name] and made her aware of above. Daughter is agreeable to pick up patient today and will bring his portable oxygen tank because he is on chronic oxygen at home. Per daughter patient has a walker and a lift chair at home however she requested a bedside commode. Per [facility] [employer] representative he will delivery bedside commode to patient's room today. Arthur [name] is agreeable for patient to continue home health services through [facility]. Hospitalist will order the 2 week protocol for home health. [place] representative is aware of above. Please reconsult if future social work needs arise. CSW signing off.      Final next level of care: [place] Barriers to Discharge: Barriers Resolved   Patient Goals and CMS Choice Patient states their goals for this hospitalization and ongoing recovery are:: To go gome.      Discharge Placement                       Discharge Plan and Services In-house Referral: Clinical Social Work Discharge Planning Services: CM Consult            DME Arranged: 3-N-1 DME Agency: [facility] Date DME Agency Contacted: 06/14/18 Time DME Agency  Contacted: 2072 Representative spoke with at DME Agency: Leroy Sea [place]: PT, RN, Nurse's Aide, Social Work, OT [place]: [place]) Date [place]: 06/14/18 Time [place]: 1142 Representative spoke with at [place]: [place] ([place]) Interventions     Readmission Risk Interventions Readmission Risk Prevention Plan 05/26/2018  Transportation Screening Complete  Home Care Screening Complete  Medication Review (RN CM) Complete  Some recent data might be hidden

## 2018-06-14 NOTE — Progress Notes (Signed)
[facility]  ROUNDING NOTE   Subjective:  No new renal function testing performed today. Most recent creatinine was 2.0. Hemoglobin currently 7.5.   Objective:  Vital signs in last 24 hours:  Temp:  [97.8 F (36.6 C)-98.2 F (36.8 C)] 98.1 F (36.7 C) (06/12 0757) Pulse Rate:  [72-86] 72 (06/12 0757) Resp:  [16-19] 16 (06/12 0757) BP: (152-168)/(53-57) 153/54 (06/12 0757) SpO2:  [92 %-100 %] 100 % (06/12 0757)  Weight change:  Filed Weights   06/09/18 1550  Weight: 115.7 kg    Intake/Output: I/O last 3 completed shifts: In: 200 [I.V.:200] Out: 566 [Urine:566]   Intake/Output this shift:  No intake/output data recorded.  Physical Exam: General: No acute distress  Head: Normocephalic, atraumatic. Moist oral mucosal membranes  Eyes: Anicteric  Neck: Supple, trachea midline  Lungs:  Clear to auscultation, normal effort  Heart: S1S2 no rubs  Abdomen:  Soft, nontender, bowel sounds present  Extremities: No peripheral edema.  Neurologic: Awake, alert, following commands  Skin: No lesions       Basic Metabolic Panel: Recent Labs  Lab 06/09/18 1126 06/10/18 0213 06/10/18 0732 06/11/18 0337 06/12/18 0525 06/13/18 0329  NA 137 137  --  139 141 143  K 4.5 5.4* 5.2* 4.5 4.7 4.4  CL 103 105  --  103 105 106  CO2 25 27  --  30 28 31   GLUCOSE 177* 201*  --  159* 147* 140*  BUN 72* 71*  --  64* 54* 41*  CREATININE 2.51* 2.62*  --  2.66* 2.38* 2.01*  CALCIUM 7.8* 7.7*  --  7.8* 8.0* 7.9*  MG  --   --   --  2.1  --   --     Liver Function Tests: Recent Labs  Lab 06/09/18 1126  AST 21  ALT 17  ALKPHOS 33*  BILITOT 0.7  PROT 5.6*  ALBUMIN 2.9*   No results for input(s): LIPASE, AMYLASE in the last 168 hours. No results for input(s): AMMONIA in the last 168 hours.  CBC: Recent Labs  Lab 06/09/18 1126  06/10/18 0213 06/10/18 1006 06/11/18 0337 06/12/18 0525 06/13/18 0329 06/14/18 0646  WBC 11.6*  --  11.7*  --  9.3 8.2 7.6 26.8*   NEUTROABS 8.2*  --   --   --  6.4  --   --   --   HGB 3.6*   < > 5.6* 7.0* 7.3* 7.6* 7.3* 7.5*  HCT 12.4*   < > 17.5*  --  23.2* 25.0* 23.7* 24.8*  MCV 100.0  --  93.1  --  91.0 95.1 95.2 97.3  PLT 272  --  221  --  193 191 172 180   < > = values in this interval not displayed.    Cardiac Enzymes: No results for input(s): CKTOTAL, CKMB, CKMBINDEX, TROPONINI in the last 168 hours.  BNP: Invalid input(s): POCBNP  CBG: Recent Labs  Lab 06/13/18 1136 06/13/18 1635 06/13/18 2105 06/14/18 0753 06/14/18 1143  GLUCAP 148* 164* 128* 149* 183*    Microbiology: Results for orders placed or performed during the hospital encounter of 06/09/18  Blood Culture (routine x 2)     Status: None   Collection Time: 06/09/18 11:32 AM   Specimen: BLOOD  Result Value Ref Range Status   Specimen Description BLOOD RIGHT ANTECUBITAL  Final   Special Requests   Final    BOTTLES DRAWN AEROBIC AND ANAEROBIC Blood Culture results may not be optimal due to an  excessive volume of blood received in culture bottles   Culture   Final    NO GROWTH 5 DAYS Performed at [facility], [address] [place], [place] [address]    Report Status 06/14/2018 FINAL  Final  Blood Culture (routine x 2)     Status: None   Collection Time: 06/09/18 11:32 AM   Specimen: BLOOD  Result Value Ref Range Status   Specimen Description BLOOD LEFT ANTECUBITAL  Final   Special Requests   Final    BOTTLES DRAWN AEROBIC AND ANAEROBIC Blood Culture results may not be optimal due to an excessive volume of blood received in culture bottles   Culture   Final    NO GROWTH 5 DAYS Performed at [facility], [address] [place], [place] [address]    Report Status 06/14/2018 FINAL  Final  Urine culture     Status: Abnormal   Collection Time: 06/09/18 12:55 PM   Specimen: Urine, Random  Result Value Ref Range Status   Specimen Description   Final    URINE, RANDOM Performed at [facility], [address] [place], [place] [address]    Special Requests   Final    NONE Performed at [facility], [address], [name], [place] [address]    Culture 10,000 COLONIES/mL ENTEROBACTER CLOACAE (A)  Final   Report Status 06/12/2018 FINAL  Final   Organism ID, Bacteria ENTEROBACTER CLOACAE (A)  Final      Susceptibility   Enterobacter cloacae - MIC*    CEFAZOLIN >=64 RESISTANT Resistant     CEFTRIAXONE >=64 RESISTANT Resistant     CIPROFLOXACIN <=0.25 SENSITIVE Sensitive     GENTAMICIN <=1 SENSITIVE Sensitive     IMIPENEM <=0.25 SENSITIVE Sensitive     NITROFURANTOIN 64 INTERMEDIATE Intermediate     TRIMETH/SULFA <=20 SENSITIVE Sensitive     PIP/TAZO >=128 RESISTANT Resistant     * 10,000 COLONIES/mL ENTEROBACTER CLOACAE  SARS Coronavirus 2 (CEPHEID - Performed in [place] hospital lab), Hosp Order     Status: None   Collection Time: 06/09/18  1:41 PM   Specimen: Nasopharyngeal Swab  Result Value Ref Range Status   SARS Coronavirus 2 NEGATIVE NEGATIVE Final    Comment: (NOTE) If result is NEGATIVE SARS-CoV-2 target nucleic acids are NOT DETECTED. The SARS-CoV-2 RNA is generally detectable in upper and lower  respiratory specimens during the acute phase of infection. The lowest  concentration of SARS-CoV-2 viral copies this assay can detect is 250  copies / mL. A negative result does not preclude SARS-CoV-2 infection  and should not be used as the sole basis for treatment or other  patient management decisions.  A negative result may occur with  improper specimen collection / handling, submission of specimen other  than nasopharyngeal swab, presence of viral mutation(s) within the  areas targeted by this assay, and inadequate number of viral copies  (<250 copies / mL). A negative result must be combined with clinical  observations, patient history, and epidemiological information. If result is POSITIVE SARS-CoV-2 target nucleic acids are DETECTED. The SARS-CoV-2  RNA is generally detectable in upper and lower  respiratory specimens dur ing the acute phase of infection.  Positive  results are indicative of active infection with SARS-CoV-2.  Clinical  correlation with patient history and other diagnostic information is  necessary to determine patient infection status.  Positive results do  not rule out bacterial infection or co-infection with other viruses. If result is  PRESUMPTIVE POSTIVE SARS-CoV-2 nucleic acids MAY BE PRESENT.   A presumptive positive result was obtained on the submitted specimen  and confirmed on repeat testing.  While 2019 novel coronavirus  (SARS-CoV-2) nucleic acids may be present in the submitted sample  additional confirmatory testing may be necessary for epidemiological  and / or clinical management purposes  to differentiate between  SARS-CoV-2 and other Sarbecovirus currently known to infect humans.  If clinically indicated additional testing with an alternate test  methodology [phone]) is advised. The SARS-CoV-2 RNA is generally  detectable in upper and lower respiratory sp ecimens during the acute  phase of infection. The expected result is Negative. Fact Sheet for Patients:  StrictlyIdeas.no Fact Sheet for Healthcare Providers: BankingDealers.co.za This test is not yet approved or cleared by the [place] FDA and has been authorized for detection and/or diagnosis of SARS-CoV-2 by FDA under an Emergency Use Authorization (EUA).  This EUA will remain in effect (meaning this test can be used) for the duration of the COVID-19 declaration under Section 564(b)(1) of the Act, 21 U.S.C. section 360bbb-3(b)(1), unless the authorization is terminated or revoked sooner. Performed at [facility], [address] [place], [place] [address]     Coagulation Studies: No results for input(s): LABPROT, INR in the last 72 hours.  Urinalysis: No results for  input(s): COLORURINE, LABSPEC, PHURINE, GLUCOSEU, HGBUR, BILIRUBINUR, KETONESUR, PROTEINUR, UROBILINOGEN, NITRITE, LEUKOCYTESUR in the last 72 hours.  Invalid input(s): APPERANCEUR    Imaging: No results found.   Medications:   . sodium chloride    . sodium chloride 1,000 mL (06/13/18 1214)  . sodium chloride 10 mL/hr at 06/12/18 0400  . sodium chloride Stopped (06/12/18 1309)   . amLODipine  2.5 mg Oral Daily  . furosemide  20 mg Oral Daily  . insulin aspart  0-5 Units Subcutaneous QHS  . insulin aspart  0-9 Units Subcutaneous TID WC  .  morphine injection  2 mg Intravenous Once  . nystatin cream   Topical BID  . pantoprazole (PROTONIX) IV  40 mg Intravenous Q12H  . pravastatin  10 mg Oral QHS  . tamsulosin  0.4 mg Oral Daily   ipratropium-albuterol, ondansetron (ZOFRAN) IV  Assessment/ Plan:  85 y.o. male with a PMHx of COPD, diabetes mellitus type 2, hyperlipidemia, hypertension, chronic kidney disease stage IV baseline EGFR of 27, who was admitted to [facility] on 06/09/2018 for evaluation of severe anemia and melena.   1.  Acute renal failure/chronic kidney disease stage IV.  Patient's baseline creatinine is 2.2.  Creatinine upon presentation was 2.6.  -Creatinine actually below baseline now.  Overall kidney function has improved as compared to admission.  Recommend follow-up as an outpatient for his underlying chronic kidney disease.  2.  Hyperkalemia.    Most recent potassium was 4.4.  Maintain the patient off of [place].  3.  Anemia of chronic kidney disease/anemia of blood loss.  Hemoglobin currently 7.5 and stable.  Continue to monitor CBC as an outpatient.   LOS: 5 Bridney [place] 6/12/20201:41 PM

## 2018-06-14 NOTE — Discharge Summary (Addendum)
[facility] NAME: Arthur [name]    MR#:  [id number]  DATE OF BIRTH:  [birth year]  DATE OF ADMISSION:  06/09/2018   ADMITTING PHYSICIAN: Sela Hua, MD  DATE OF DISCHARGE: 06/14/18  PRIMARY CARE PHYSICIAN: Baxter Hire, MD   ADMISSION DIAGNOSIS:   Weakness [R53.1] Anemia due to gastrointestinal blood loss [D50.0]  DISCHARGE DIAGNOSIS:   Active Problems:   GI bleeding  SECONDARY DIAGNOSIS:   Past Medical History:  Diagnosis Date  . COPD (chronic obstructive pulmonary disease) ([place])   . Diabetes mellitus without complication ([place])   . Hyperlipidemia   . Hypertension    HOSPITAL COURSE:  85 y.o.maleCOPD, hypertension, diabetes mellitus, hyperlipidemia, anemia and chronic kidney disease, and chronic kidney disease stage III presenting to the ED with generalized weakness. He is found to have hemoglobin of 3.6 on admission.  1.GI Bleed = LikelyLoweras patient presenting with rectal bleedwithout evidence hemodynamic instability -CT abdomen and pelvis shows 1.8 cm gallstone otherwise no acute finding - s/p EGD 6/8 with no significant finding or source of bleeding - S/P colonoscopy 6/11 no active bleeding noted but bowel prep inadequate to rule out source -Continue pantoprazole 40 mg IV BID - Hold NSAIDs, steroids, ASA - Follow up with GI as outpatient for capsule study  2.Severe anemia (Hgb 3.6 on admission)-likely secondary to GI bleed as above s/p PRBC for total of 4units - stable Hgb 7.5 at discharge  3.DM: His sugars havenot beenwell-controlled on current regimen - Resume home insulin  4.Acute on chronic kidney disease -BUN and creatinine elevated on admission -Continue to monitor renal function - renal ultrasound shows no abnormality or cause for renal failure  -Follow up with Nephrology as scheduled  5. Hyperkalemia - stable at 4.4  6.Bilateral lower extremity cellulitis with  group B strep bacteremia - significant improvement -Previous work-up with ultrasound negative for DVT -Recently treated with broad-spectrum antibiotic coverage with cefepime and vancomycin -blood cultures shows no growth so far - Completed amoxicillin  7.Acute on Chronic respiratory failure with hypoxia due to severe COPD - On 2L at home - Continue home inhalers and Duonebs - Home health following - D/C with PT/OT/RN/Aide, respiratory care and social worker - Sound 2 week protocol  8.HLD  + Goal LDL<70 -Pravastatin 10 mg  9.HTN-slightly elevated + Goal BP <140/90 - Continue amlodipine  Patient was evaluated by PT and it was recommended that he be discharged to SNF for rehab. Patient adamantly refused SNF placement and preferred to be discharged home with [facility] services. Patient's daughter was also involved in the discussion and was agreeable to patient returning home.  CONSULTS OBTAINED:   Treatment Team:  Murlean Iba, MD Anthonette Legato, MD  DRUG ALLERGIES:   Allergies  Allergen Reactions  . Codeine Other (See Comments)    Constipation   DISCHARGE MEDICATIONS:   Allergies as of 06/14/2018      Reactions   Codeine Other (See Comments)   Constipation      Medication List    TAKE these medications   albuterol 108 (90 Base) MCG/ACT inhaler Commonly known as: VENTOLIN HFA Inhale 2 puffs into the lungs every 6 (six) hours as needed for wheezing or shortness of breath.   amLODipine 2.5 MG tablet Commonly known as: NORVASC Take 2.5 mg by mouth daily.   furosemide 20 MG tablet Commonly known as: LASIX Take 20 mg by mouth daily as needed for edema.   guaiFENesin-dextromethorphan 100-10 MG/5ML  syrup Commonly known as: ROBITUSSIN DM Take 5 mLs by mouth every 4 (four) hours as needed for cough.   insulin aspart 100 UNIT/ML injection Commonly known as: novoLOG Inject 20 Units into the skin 3 (three) times daily before meals.   ipratropium-albuterol  0.5-2.5 (3) MG/3ML Soln Commonly known as: DUONEB Take 3 mLs by nebulization every 6 (six) hours as needed.   Levemir 100 UNIT/ML injection Generic drug: insulin detemir Inject 0.12 mLs (12 Units total) into the skin daily. What changed:   how much to take  when to take this   nystatin cream Commonly known as: MYCOSTATIN Apply topically 2 (two) times daily.   pantoprazole 40 MG tablet Commonly known as: Protonix Take 1 tablet (40 mg total) by mouth 2 (two) times daily.   pravastatin 10 MG tablet Commonly known as: PRAVACHOL Take 10 mg by mouth at bedtime.   tamsulosin 0.4 MG Caps capsule Commonly known as: FLOMAX Take 0.4 mg by mouth daily.      DISCHARGE INSTRUCTIONS:    DIET:   Heart healthy/carb modified  ACTIVITY:   Activity as tolerated  OXYGEN:   Home Oxygen: Yes.    Oxygen Delivery: 2 liters/min via Patient connected to nasal cannula oxygen  DISCHARGE LOCATION:   home   If you experience worsening of your admission symptoms, develop shortness of breath, life threatening emergency, suicidal or homicidal thoughts you must seek medical attention immediately by calling 911 or calling your MD immediately  if symptoms less severe.  You Must read complete instructions/literature along with all the possible adverse reactions/side effects for all the Medicines you take and that have been prescribed to you. Take any new Medicines after you have completely understood and accpet all the possible adverse reactions/side effects.   Please note  You were cared for by a hospitalist during your hospital stay. If you have any questions about your discharge medications or the care you received while you were in the hospital after you are discharged, you can call the unit and asked to speak with the hospitalist on call if the hospitalist that took care of you is not available. Once you are discharged, your primary care physician will handle any further medical issues.  Please note that NO REFILLS for any discharge medications will be authorized once you are discharged, as it is imperative that you return to your primary care physician (or establish a relationship with a primary care physician if you do not have one) for your aftercare needs so that they can reassess your need for medications and monitor your lab values.  On the day of Discharge:  VITAL SIGNS:   Blood pressure (!) 153/54, pulse 72, temperature 98.1 F (36.7 C), resp. rate 16, height 5\' 11"  (1.803 m), weight 115.7 kg, SpO2 100 %.  PHYSICAL EXAMINATION:   GENERAL:85 y.o.-year-old patient lying in the bed with no acute distress.  EYES: Pupils equal, round, reactive to light and accommodation. No scleral icterus. Extraocular muscles intact.  HEENT: Head atraumatic, normocephalic. Oropharynx and nasopharynx clear.  NECK: Supple, no jugular venous distention. No thyroid enlargement, no tenderness.  LUNGS: Normal breath sounds bilaterally, no wheezing, rales,rhonchi or crepitation. No use of accessory muscles of respiration.  CARDIOVASCULAR: S1, S2 normal. No murmurs, rubs, or gallops.  ABDOMEN:Distended and non-tender. Bowel sounds present. No organomegaly or mass.  EXTREMITIES:Bilateral pitting edema of lower extremities with anterior erythema, tenderness still present but much improved from previous. No cyanosis, or clubbing. No rash or lesions. + pedal pulses  MUSCULOSKELETAL: Normal bulk, and power was 5+ grip and elbow, knee, and ankle flexion and extension bilaterally.  NEUROLOGIC:Alert and oriented x 3. CN 2-12 intact. Sensation to light touch and cold stimuli intact bilaterally. Finger to nose nl. Babinski is downgoing. DTR's (biceps, patellar, and achilles) 2+ and symmetric throughout. Gait not tested due to safety concern. PSYCHIATRIC: The patient is alert and oriented x 3.  SKIN:As below  Left Leg    Right Leg   DATA REVIEW:   CBC Recent Labs  Lab 06/14/18 0646   WBC 26.8*  HGB 7.5*  HCT 24.8*  PLT 180    Chemistries  Recent Labs  Lab 06/09/18 1126  06/11/18 0337  06/13/18 0329  NA 137   < > 139   < > 143  K 4.5   < > 4.5   < > 4.4  CL 103   < > 103   < > 106  CO2 25   < > 30   < > 31  GLUCOSE 177*   < > 159*   < > 140*  BUN 72*   < > 64*   < > 41*  CREATININE 2.51*   < > 2.66*   < > 2.01*  CALCIUM 7.8*   < > 7.8*   < > 7.9*  MG  --   --  2.1  --   --   AST 21  --   --   --   --   ALT 17  --   --   --   --   ALKPHOS 33*  --   --   --   --   BILITOT 0.7  --   --   --   --    < > = values in this interval not displayed.     Microbiology Results  Results for orders placed or performed during the hospital encounter of 06/09/18  Blood Culture (routine x 2)     Status: None   Collection Time: 06/09/18 11:32 AM   Specimen: BLOOD  Result Value Ref Range Status   Specimen Description BLOOD RIGHT ANTECUBITAL  Final   Special Requests   Final    BOTTLES DRAWN AEROBIC AND ANAEROBIC Blood Culture results may not be optimal due to an excessive volume of blood received in culture bottles   Culture   Final    NO GROWTH 5 DAYS Performed at [facility], [address] [place], [place] [address]    Report Status 06/14/2018 FINAL  Final  Blood Culture (routine x 2)     Status: None   Collection Time: 06/09/18 11:32 AM   Specimen: BLOOD  Result Value Ref Range Status   Specimen Description BLOOD LEFT ANTECUBITAL  Final   Special Requests   Final    BOTTLES DRAWN AEROBIC AND ANAEROBIC Blood Culture results may not be optimal due to an excessive volume of blood received in culture bottles   Culture   Final    NO GROWTH 5 DAYS Performed at [facility], [address] [place], [place] [address]    Report Status 06/14/2018 FINAL  Final  Urine culture     Status: Abnormal   Collection Time: 06/09/18 12:55 PM   Specimen: Urine, Random  Result Value Ref Range Status   Specimen Description   Final    URINE, RANDOM  Performed at [facility], [address], [place], [place] [address]    Special Requests   Final  NONE Performed at [facility], [address] [place], [place] [address]    Culture 10,000 COLONIES/mL ENTEROBACTER CLOACAE (A)  Final   Report Status 06/12/2018 FINAL  Final   Organism ID, Bacteria ENTEROBACTER CLOACAE (A)  Final      Susceptibility   Enterobacter cloacae - MIC*    CEFAZOLIN >=64 RESISTANT Resistant     CEFTRIAXONE >=64 RESISTANT Resistant     CIPROFLOXACIN <=0.25 SENSITIVE Sensitive     GENTAMICIN <=1 SENSITIVE Sensitive     IMIPENEM <=0.25 SENSITIVE Sensitive     NITROFURANTOIN 64 INTERMEDIATE Intermediate     TRIMETH/SULFA <=20 SENSITIVE Sensitive     PIP/TAZO >=128 RESISTANT Resistant     * 10,000 COLONIES/mL ENTEROBACTER CLOACAE  SARS Coronavirus 2 (CEPHEID - Performed in [place] hospital lab), Hosp Order     Status: None   Collection Time: 06/09/18  1:41 PM   Specimen: Nasopharyngeal Swab  Result Value Ref Range Status   SARS Coronavirus 2 NEGATIVE NEGATIVE Final    Comment: (NOTE) If result is NEGATIVE SARS-CoV-2 target nucleic acids are NOT DETECTED. The SARS-CoV-2 RNA is generally detectable in upper and lower  respiratory specimens during the acute phase of infection. The lowest  concentration of SARS-CoV-2 viral copies this assay can detect is 250  copies / mL. A negative result does not preclude SARS-CoV-2 infection  and should not be used as the sole basis for treatment or other  patient management decisions.  A negative result may occur with  improper specimen collection / handling, submission of specimen other  than nasopharyngeal swab, presence of viral mutation(s) within the  areas targeted by this assay, and inadequate number of viral copies  (<250 copies / mL). A negative result must be combined with clinical  observations, patient history, and epidemiological information. If result is POSITIVE SARS-CoV-2 target  nucleic acids are DETECTED. The SARS-CoV-2 RNA is generally detectable in upper and lower  respiratory specimens dur ing the acute phase of infection.  Positive  results are indicative of active infection with SARS-CoV-2.  Clinical  correlation with patient history and other diagnostic information is  necessary to determine patient infection status.  Positive results do  not rule out bacterial infection or co-infection with other viruses. If result is PRESUMPTIVE POSTIVE SARS-CoV-2 nucleic acids MAY BE PRESENT.   A presumptive positive result was obtained on the submitted specimen  and confirmed on repeat testing.  While 2019 novel coronavirus  (SARS-CoV-2) nucleic acids may be present in the submitted sample  additional confirmatory testing may be necessary for epidemiological  and / or clinical management purposes  to differentiate between  SARS-CoV-2 and other Sarbecovirus currently known to infect humans.  If clinically indicated additional testing with an alternate test  methodology [phone]) is advised. The SARS-CoV-2 RNA is generally  detectable in upper and lower respiratory sp ecimens during the acute  phase of infection. The expected result is Negative. Fact Sheet for Patients:  StrictlyIdeas.no Fact Sheet for Healthcare Providers: BankingDealers.co.za This test is not yet approved or cleared by the [place] FDA and has been authorized for detection and/or diagnosis of SARS-CoV-2 by FDA under an Emergency Use Authorization (EUA).  This EUA will remain in effect (meaning this test can be used) for the duration of the COVID-19 declaration under Section 564(b)(1) of the Act, 21 U.S.C. section 360bbb-3(b)(1), unless the authorization is terminated or revoked sooner. Performed at [facility], [address] [place], [place] [address]  RADIOLOGY:  No results found.   Management plans discussed with the  patient, family and they are in agreement.  CODE STATUS:     Code Status Orders  (From admission, onward)         Start     Ordered   06/09/18 1345  Do not attempt resuscitation (DNR)  Continuous    Question Answer Comment  In the event of cardiac or respiratory ARREST Do not call a "code blue"   In the event of cardiac or respiratory ARREST Do not perform Intubation, CPR, defibrillation or ACLS   In the event of cardiac or respiratory ARREST Use medication by any route, position, wound care, and other measures to relive pain and suffering. May use oxygen, suction and manual treatment of airway obstruction as needed for comfort.      06/09/18 1350        Code Status History    Date Active Date Inactive Code Status Order ID Comments User Context   05/23/2018 0919 05/28/2018 2024 DNR [id number]  Demetrios Loll, MD Inpatient   05/23/2018 0104 05/23/2018 0918 Full Code [id number]  Mayer Camel, NP Inpatient   Advance Care Planning Activity      TOTAL TIME TAKING CARE OF THIS PATIENT: 38 minutes.   This patient was staffed with Dr. Valetta Fuller, [facility] who personally evaluated patient, reviewed documentation and agreed with discharge plan of care as above.  Rufina Falco, DNP, FNP-BC Hospitalist Nurse Practitioner    06/14/2018 at 10:48 AM  Between 7am to 6pm - Pager - [phone]  After 6pm go to [email or web address] - Proofreader  [facility]  Office  [phone]  CC: Primary care physician; Baxter Hire, MD   Note: This dictation was prepared with Dragon dictation along with smaller phrase technology. Any transcriptional errors that result from this process are unintentional.

## 2018-06-15 DIAGNOSIS — Z87891 Personal history of nicotine dependence: Secondary | ICD-10-CM | POA: Diagnosis not present

## 2018-06-15 DIAGNOSIS — L03115 Cellulitis of right lower limb: Secondary | ICD-10-CM | POA: Diagnosis not present

## 2018-06-15 DIAGNOSIS — E1122 Type 2 diabetes mellitus with diabetic chronic kidney disease: Secondary | ICD-10-CM | POA: Diagnosis not present

## 2018-06-15 DIAGNOSIS — I872 Venous insufficiency (chronic) (peripheral): Secondary | ICD-10-CM | POA: Diagnosis not present

## 2018-06-15 DIAGNOSIS — R7881 Bacteremia: Secondary | ICD-10-CM | POA: Diagnosis not present

## 2018-06-15 DIAGNOSIS — B9689 Other specified bacterial agents as the cause of diseases classified elsewhere: Secondary | ICD-10-CM | POA: Diagnosis not present

## 2018-06-15 DIAGNOSIS — J449 Chronic obstructive pulmonary disease, unspecified: Secondary | ICD-10-CM | POA: Diagnosis not present

## 2018-06-15 DIAGNOSIS — N183 Chronic kidney disease, stage 3 (moderate): Secondary | ICD-10-CM | POA: Diagnosis not present

## 2018-06-15 DIAGNOSIS — I129 Hypertensive chronic kidney disease with stage 1 through stage 4 chronic kidney disease, or unspecified chronic kidney disease: Secondary | ICD-10-CM | POA: Diagnosis not present

## 2018-06-17 ENCOUNTER — Telehealth: Payer: Self-pay | Admitting: Gastroenterology

## 2018-06-17 DIAGNOSIS — G4719 Other hypersomnia: Secondary | ICD-10-CM | POA: Diagnosis not present

## 2018-06-17 DIAGNOSIS — D649 Anemia, unspecified: Secondary | ICD-10-CM | POA: Diagnosis not present

## 2018-06-17 DIAGNOSIS — J431 Panlobular emphysema: Secondary | ICD-10-CM | POA: Diagnosis not present

## 2018-06-17 DIAGNOSIS — K922 Gastrointestinal hemorrhage, unspecified: Secondary | ICD-10-CM | POA: Diagnosis not present

## 2018-06-17 DIAGNOSIS — N183 Chronic kidney disease, stage 3 (moderate): Secondary | ICD-10-CM | POA: Diagnosis not present

## 2018-06-17 DIAGNOSIS — L03119 Cellulitis of unspecified part of limb: Secondary | ICD-10-CM | POA: Diagnosis not present

## 2018-06-17 DIAGNOSIS — I10 Essential (primary) hypertension: Secondary | ICD-10-CM | POA: Diagnosis not present

## 2018-06-17 NOTE — Telephone Encounter (Signed)
I called patient & l/m to call the office to see if he could be seen sooner by Dr Vicente Males.currently scheduled for 07-11-2018 & has colonoscopy done on 06-13-18 by Dr Vicente Males.

## 2018-06-18 DIAGNOSIS — R7881 Bacteremia: Secondary | ICD-10-CM | POA: Diagnosis not present

## 2018-06-18 DIAGNOSIS — I872 Venous insufficiency (chronic) (peripheral): Secondary | ICD-10-CM | POA: Diagnosis not present

## 2018-06-18 DIAGNOSIS — J449 Chronic obstructive pulmonary disease, unspecified: Secondary | ICD-10-CM | POA: Diagnosis not present

## 2018-06-18 DIAGNOSIS — B9689 Other specified bacterial agents as the cause of diseases classified elsewhere: Secondary | ICD-10-CM | POA: Diagnosis not present

## 2018-06-18 DIAGNOSIS — E1122 Type 2 diabetes mellitus with diabetic chronic kidney disease: Secondary | ICD-10-CM | POA: Diagnosis not present

## 2018-06-18 DIAGNOSIS — Z87891 Personal history of nicotine dependence: Secondary | ICD-10-CM | POA: Diagnosis not present

## 2018-06-18 DIAGNOSIS — L03115 Cellulitis of right lower limb: Secondary | ICD-10-CM | POA: Diagnosis not present

## 2018-06-18 DIAGNOSIS — N183 Chronic kidney disease, stage 3 (moderate): Secondary | ICD-10-CM | POA: Diagnosis not present

## 2018-06-18 DIAGNOSIS — I129 Hypertensive chronic kidney disease with stage 1 through stage 4 chronic kidney disease, or unspecified chronic kidney disease: Secondary | ICD-10-CM | POA: Diagnosis not present

## 2018-06-19 ENCOUNTER — Other Ambulatory Visit: Payer: Self-pay

## 2018-06-19 ENCOUNTER — Ambulatory Visit: Payer: Medicare HMO | Admitting: Gastroenterology

## 2018-06-19 ENCOUNTER — Encounter: Payer: Self-pay | Admitting: Gastroenterology

## 2018-06-19 VITALS — BP 162/67 | HR 93 | Temp 98.2°F | Ht 71.0 in | Wt 262.6 lb

## 2018-06-19 DIAGNOSIS — J449 Chronic obstructive pulmonary disease, unspecified: Secondary | ICD-10-CM | POA: Diagnosis not present

## 2018-06-19 DIAGNOSIS — N183 Chronic kidney disease, stage 3 (moderate): Secondary | ICD-10-CM | POA: Diagnosis not present

## 2018-06-19 DIAGNOSIS — R7881 Bacteremia: Secondary | ICD-10-CM | POA: Diagnosis not present

## 2018-06-19 DIAGNOSIS — K921 Melena: Secondary | ICD-10-CM | POA: Diagnosis not present

## 2018-06-19 DIAGNOSIS — I872 Venous insufficiency (chronic) (peripheral): Secondary | ICD-10-CM | POA: Diagnosis not present

## 2018-06-19 DIAGNOSIS — L03115 Cellulitis of right lower limb: Secondary | ICD-10-CM | POA: Diagnosis not present

## 2018-06-19 DIAGNOSIS — E1122 Type 2 diabetes mellitus with diabetic chronic kidney disease: Secondary | ICD-10-CM | POA: Diagnosis not present

## 2018-06-19 DIAGNOSIS — I129 Hypertensive chronic kidney disease with stage 1 through stage 4 chronic kidney disease, or unspecified chronic kidney disease: Secondary | ICD-10-CM | POA: Diagnosis not present

## 2018-06-19 DIAGNOSIS — Z87891 Personal history of nicotine dependence: Secondary | ICD-10-CM | POA: Diagnosis not present

## 2018-06-19 DIAGNOSIS — B9689 Other specified bacterial agents as the cause of diseases classified elsewhere: Secondary | ICD-10-CM | POA: Diagnosis not present

## 2018-06-19 NOTE — Progress Notes (Signed)
Jonathon Bellows MD, MRCP(U.K) [address]  [place], [place] [address]  Main: [phone]  Fax: [phone]   Primary Care Physician: Baxter Hire, MD  Primary Gastroenterologist:  Dr. Jonathon Bellows   No chief complaint on file.   HPI: Arthur [name] is a 85 y.o. male is here today to see me as a hospital follow up    Summary of history : Admitted on 06/09/2018 with melena. Hb 3.6 grams with MCV 100. Prior to that Hb was 8 grams 3 weeks prior. Urine showed no blood. No iron studies or b12 levels checked. Underwent a blood transfusion.   EGD+colonoscopy was performed that showed no lesions that would explain the severe anemia.  Possible short segment barrettes esophagus. Plan was for outpatient capsule study   Interval history   06/14/2018-  06/19/2018   Doing well , has brown stools. No other issues, started taking iron supplement.   Current Outpatient Medications  Medication Sig Dispense Refill   albuterol (VENTOLIN HFA) 108 (90 Base) MCG/ACT inhaler Inhale 2 puffs into the lungs every 6 (six) hours as needed for wheezing or shortness of breath. 1 Inhaler 2   amLODipine (NORVASC) 2.5 MG tablet Take 2.5 mg by mouth daily.     furosemide (LASIX) 20 MG tablet Take 20 mg by mouth daily as needed for edema.     guaiFENesin-dextromethorphan (ROBITUSSIN DM) 100-10 MG/5ML syrup Take 5 mLs by mouth every 4 (four) hours as needed for cough. 118 mL 0   insulin aspart (NOVOLOG) 100 UNIT/ML injection Inject 20 Units into the skin 3 (three) times daily before meals.     ipratropium-albuterol (DUONEB) 0.5-2.5 (3) MG/3ML SOLN Take 3 mLs by nebulization every 6 (six) hours as needed. 360 mL 0   LEVEMIR 100 UNIT/ML injection Inject 0.12 mLs (12 Units total) into the skin daily. (Patient taking differently: Inject 40 Units into the skin at bedtime. ) 10 mL 0   nystatin cream (MYCOSTATIN) Apply topically 2 (two) times daily. 30 g 0   pantoprazole (PROTONIX) 40 MG tablet Take  1 tablet (40 mg total) by mouth 2 (two) times daily. 60 tablet 0   pravastatin (PRAVACHOL) 10 MG tablet Take 10 mg by mouth at bedtime.     tamsulosin (FLOMAX) 0.4 MG CAPS capsule Take 0.4 mg by mouth daily.     No current facility-administered medications for this visit.     Allergies as of 06/19/2018 - Review Complete 06/13/2018  Allergen Reaction Noted   Codeine Other (See Comments) 08/06/2013    ROS:  General: Negative for anorexia, weight loss, fever, chills, fatigue, weakness. ENT: Negative for hoarseness, difficulty swallowing , nasal congestion. CV: Negative for chest pain, angina, palpitations, dyspnea on exertion, peripheral edema.  Respiratory: Negative for dyspnea at rest, dyspnea on exertion, cough, sputum, wheezing.  GI: See history of present illness. GU:  Negative for dysuria, hematuria, urinary incontinence, urinary frequency, nocturnal urination.  Endo: Negative for unusual weight change.    Physical Examination:   There were no vitals taken for this visit.  General: Well-nourished, well-developed in no acute distress.  Eyes: No icterus. Conjunctivae pink. Mouth: Oropharyngeal mucosa moist and pink , no lesions erythema or exudate. Lungs: Clear to auscultation bilaterally. Non-labored. Heart: Regular rate and rhythm, no murmurs rubs or gallops.  Abdomen: Bowel sounds are normal, nontender, nondistended, no hepatosplenomegaly or masses, no abdominal bruits or hernia , no rebound or guarding.   Extremities: No lower extremity edema. No clubbing or  deformities. Neuro: Alert and oriented x 3.  Grossly intact. Skin: Warm and dry, no jaundice.   Psych: Alert and cooperative, normal mood and affect.   Imaging Studies: Ct Abdomen Pelvis Wo Contrast  Result Date: 06/09/2018 CLINICAL DATA:  Dark red blood per rectum with weakness and fever. EXAM: CT ABDOMEN AND PELVIS WITHOUT CONTRAST TECHNIQUE: Multidetector CT imaging of the abdomen and pelvis was performed  following the standard protocol without IV contrast. COMPARISON:  None. FINDINGS: Lower chest: Small right pleural effusion with associated right basilar atelectasis. Mild calcified plaque over the left main, left anterior descending and right coronary arteries. Calcified plaque over the thoracic aorta. Hepatobiliary: Single 1.8 cm calcified gallstone. Liver and biliary tree are normal. Pancreas: Normal. Spleen: Normal. Adrenals/Urinary Tract: Adrenal glands are normal. Kidneys are normal in size without hydronephrosis or nephrolithiasis. Ureters and bladder are normal. Stomach/Bowel: Stomach and small bowel are normal. Appendix is normal. Colon is normal. Vascular/Lymphatic: Moderate calcified plaque over the abdominal aorta. No adenopathy. Reproductive: Normal. Other: No free fluid or focal inflammatory change. Small periumbilical hernia containing only peritoneal fat. Musculoskeletal: Degenerative change of the spine and hips. IMPRESSION: No acute findings in the abdomen/pelvis. Small right pleural effusion with associated right basilar atelectasis. 1.8 cm single gallstone. Aortic Atherosclerosis (ICD10-I70.0). Atherosclerotic coronary artery disease. Small periumbilical hernia containing only peritoneal fat. Electronically Signed   By: Marin Olp M.D.   On: 06/09/2018 14:56   US Renal  Result Date: 06/12/2018 CLINICAL DATA:  Acute renal failure EXAM: RENAL / URINARY TRACT ULTRASOUND COMPLETE COMPARISON:  None. FINDINGS: Right Kidney: Renal measurements: 10.7 x 4.9 x 3.7 cm = volume: 99.8 mL . Echogenicity within normal limits. No mass or hydronephrosis visualized. Left Kidney: Renal measurements: 11.8 x 5.5 x 3.5 cm = volume: 119.0 mL. Echogenicity within normal limits. No mass or hydronephrosis visualized. Bladder: Appears normal for degree of bladder distention. IMPRESSION: No cause for renal failure identified.  Normal study. Electronically Signed   By: Dorise Bullion III M.D   On: 06/12/2018 14:08    US Venous Img Lower Bilateral  Result Date: 05/22/2018 CLINICAL DATA:  85 year old male with lower extremity edema and color changes with no known injury. EXAM: BILATERAL LOWER EXTREMITY VENOUS DOPPLER ULTRASOUND TECHNIQUE: Gray-scale sonography with graded compression, as well as color Doppler and duplex ultrasound were performed to evaluate the lower extremity deep venous systems from the level of the common femoral vein and including the common femoral, femoral, profunda femoral, popliteal and calf veins including the posterior tibial, peroneal and gastrocnemius veins when visible. The superficial great saphenous vein was also interrogated. Spectral Doppler was utilized to evaluate flow at rest and with distal augmentation maneuvers in the common femoral, femoral and popliteal veins. COMPARISON:  None. FINDINGS: RIGHT LOWER EXTREMITY Common Femoral Vein: No evidence of thrombus. Normal compressibility, respiratory phasicity and response to augmentation. Saphenofemoral Junction: No evidence of thrombus. Normal compressibility and flow on color Doppler imaging. Profunda Femoral Vein: No evidence of thrombus. Normal compressibility and flow on color Doppler imaging. Femoral Vein: No evidence of thrombus. Normal compressibility, respiratory phasicity and response to augmentation. Popliteal Vein: No evidence of thrombus. Normal compressibility, respiratory phasicity and response to augmentation. Calf Veins: Visible are without evidence of thrombus. Normal compressibility and flow on color Doppler imaging. Other Findings:  None. LEFT LOWER EXTREMITY Common Femoral Vein: No evidence of thrombus. Normal compressibility, respiratory phasicity and response to augmentation. Saphenofemoral Junction: No evidence of thrombus. Normal compressibility and flow on color Doppler imaging. Profunda  Femoral Vein: No evidence of thrombus. Normal compressibility and flow on color Doppler imaging. Femoral Vein: No evidence of  thrombus. Normal compressibility, respiratory phasicity and response to augmentation. Popliteal Vein: No evidence of thrombus. Normal compressibility, respiratory phasicity and response to augmentation. Calf Veins: Visible are without evidence of thrombus. Normal compressibility and flow on color Doppler imaging. Other Findings:  None. IMPRESSION: No evidence of deep venous thrombosis in either lower extremity. Electronically Signed   By: Genevie Ann M.D.   On: 05/22/2018 23:35   Dg Chest Port 1 View  Result Date: 06/09/2018 CLINICAL DATA:  Fever EXAM: PORTABLE CHEST 1 VIEW COMPARISON:  May 23, 2008 FINDINGS: There is stable cardiac prominence with pulmonary vascularity normal. There is interstitial thickening without frank edema or consolidation. There is aortic atherosclerosis. No adenopathy. There is degenerative change in each shoulder. IMPRESSION: Stable cardiac prominence. Interstitial thickening which may reflect chronic inflammatory type change. No frank edema or consolidation evident. No adenopathy appreciable. Aortic Atherosclerosis (ICD10-I70.0). Electronically Signed   By: Lowella Grip III M.D.   On: 06/09/2018 12:27   Dg Chest Port 1 View  Result Date: 05/24/2018 CLINICAL DATA:  85 year old male with a history of pneumonia EXAM: PORTABLE CHEST 1 VIEW COMPARISON:  05/22/2018 FINDINGS: Cardiomediastinal silhouette unchanged in size and contour. Coarsened interstitial markings bilaterally, with reticulonodular pattern. No pneumothorax. No pleural effusion. No new confluent airspace disease. No significant interlobular septal thickening. Slight improved aeration compared to the prior IMPRESSION: Slight improvement in aeration, with persisting reticulonodular opacity which may reflect chronic changes and/or superimposed multifocal infection. Electronically Signed   By: Corrie Mckusick D.O.   On: 05/24/2018 08:28   Dg Chest Port 1 View  Result Date: 05/22/2018 CLINICAL DATA:  Sepsis fever productive  cough EXAM: PORTABLE CHEST 1 VIEW COMPARISON:  None. FINDINGS: Heart size upper limits of normal. No consolidation or effusion. Diffuse interstitial opacity. No pneumothorax. Minimal atelectasis right base. IMPRESSION: Mild diffuse increased interstitial opacity suggesting interstitial inflammatory process. Mild atelectasis at the right base Electronically Signed   By: Donavan Foil M.D.   On: 05/22/2018 20:05    Assessment and Plan:   DAHIR AYER is a 85 y.o. y/o male admitted with a HB 3.6, melena , EGD+colonoscopy showed no lesion to explain the blood loss. Was transfused , stable and discharged.   Plan  1. CBC, iron studies, b12,folate 2. Capsule study of the small bowel  3. CBC in 2-3 weeks   Risks, benefits, alternatives of Givens capsule discussed with patient to include but not limited to the rare risk of Given's capsule becoming lodged in the GI tract requiring surgical removal.  The patient agrees with this plan & consent will be obtained.    Dr Jonathon Bellows  MD,MRCP [facility]) Follow up in 6 weeks

## 2018-06-20 ENCOUNTER — Encounter: Payer: Self-pay | Admitting: Gastroenterology

## 2018-06-20 DIAGNOSIS — E1122 Type 2 diabetes mellitus with diabetic chronic kidney disease: Secondary | ICD-10-CM | POA: Diagnosis not present

## 2018-06-20 DIAGNOSIS — L03115 Cellulitis of right lower limb: Secondary | ICD-10-CM | POA: Diagnosis not present

## 2018-06-20 DIAGNOSIS — N183 Chronic kidney disease, stage 3 (moderate): Secondary | ICD-10-CM | POA: Diagnosis not present

## 2018-06-20 DIAGNOSIS — I129 Hypertensive chronic kidney disease with stage 1 through stage 4 chronic kidney disease, or unspecified chronic kidney disease: Secondary | ICD-10-CM | POA: Diagnosis not present

## 2018-06-20 DIAGNOSIS — J449 Chronic obstructive pulmonary disease, unspecified: Secondary | ICD-10-CM | POA: Diagnosis not present

## 2018-06-20 DIAGNOSIS — Z87891 Personal history of nicotine dependence: Secondary | ICD-10-CM | POA: Diagnosis not present

## 2018-06-20 DIAGNOSIS — B9689 Other specified bacterial agents as the cause of diseases classified elsewhere: Secondary | ICD-10-CM | POA: Diagnosis not present

## 2018-06-20 DIAGNOSIS — I872 Venous insufficiency (chronic) (peripheral): Secondary | ICD-10-CM | POA: Diagnosis not present

## 2018-06-20 DIAGNOSIS — R7881 Bacteremia: Secondary | ICD-10-CM | POA: Diagnosis not present

## 2018-06-20 LAB — CBC WITH DIFFERENTIAL/PLATELET
Basophils Absolute: 0 x10E3/uL (ref 0.0–0.2)
Basos: 1 %
EOS (ABSOLUTE): 0.3 x10E3/uL (ref 0.0–0.4)
Eos: 5 %
Hematocrit: 23.3 % — ABNORMAL LOW (ref 37.5–51.0)
Hemoglobin: 7.2 g/dL — ABNORMAL LOW (ref 13.0–17.7)
Immature Grans (Abs): 0 x10E3/uL (ref 0.0–0.1)
Immature Granulocytes: 0 %
Lymphocytes Absolute: 1 x10E3/uL (ref 0.7–3.1)
Lymphs: 16 %
MCH: 28.8 pg (ref 26.6–33.0)
MCHC: 30.9 g/dL — ABNORMAL LOW (ref 31.5–35.7)
MCV: 93 fL (ref 79–97)
Monocytes Absolute: 0.7 x10E3/uL (ref 0.1–0.9)
Monocytes: 12 %
Neutrophils Absolute: 4 x10E3/uL (ref 1.4–7.0)
Neutrophils: 66 %
Platelets: 215 x10E3/uL (ref 150–450)
RBC: 2.5 x10E6/uL — CL (ref 4.14–5.80)
RDW: 17.2 % — ABNORMAL HIGH (ref 11.6–15.4)
WBC: 6.1 x10E3/uL (ref 3.4–10.8)

## 2018-06-20 LAB — IRON,TIBC AND FERRITIN PANEL
Ferritin: 263 ng/mL (ref 30–400)
Iron Saturation: 15 % (ref 15–55)
Iron: 26 ug/dL — ABNORMAL LOW (ref 38–169)
Total Iron Binding Capacity: 176 ug/dL — ABNORMAL LOW (ref 250–450)
UIBC: 150 ug/dL (ref 111–343)

## 2018-06-20 LAB — B12 AND FOLATE PANEL
Folate: 4.8 ng/mL
Vitamin B-12: 757 pg/mL (ref 232–1245)

## 2018-06-20 NOTE — Progress Notes (Signed)
Arthur [name] inform Hb stable- not really improved nor gone down. Will watch closely- repeat CBC in 2 weeks - also may need to get the capsule study at [facility] since our machine is down- dont think I want to wait for weeks till it gets repaired.   [name], MD   Dr Jonathon Bellows MD,MRCP [facility]) Gastroenterology/Hepatology Pager: [phone]

## 2018-06-21 DIAGNOSIS — J449 Chronic obstructive pulmonary disease, unspecified: Secondary | ICD-10-CM | POA: Diagnosis not present

## 2018-06-21 DIAGNOSIS — I129 Hypertensive chronic kidney disease with stage 1 through stage 4 chronic kidney disease, or unspecified chronic kidney disease: Secondary | ICD-10-CM | POA: Diagnosis not present

## 2018-06-21 DIAGNOSIS — B9689 Other specified bacterial agents as the cause of diseases classified elsewhere: Secondary | ICD-10-CM | POA: Diagnosis not present

## 2018-06-21 DIAGNOSIS — Z87891 Personal history of nicotine dependence: Secondary | ICD-10-CM | POA: Diagnosis not present

## 2018-06-21 DIAGNOSIS — R0989 Other specified symptoms and signs involving the circulatory and respiratory systems: Secondary | ICD-10-CM | POA: Diagnosis not present

## 2018-06-21 DIAGNOSIS — E1122 Type 2 diabetes mellitus with diabetic chronic kidney disease: Secondary | ICD-10-CM | POA: Diagnosis not present

## 2018-06-21 DIAGNOSIS — L03115 Cellulitis of right lower limb: Secondary | ICD-10-CM | POA: Diagnosis not present

## 2018-06-21 DIAGNOSIS — R7881 Bacteremia: Secondary | ICD-10-CM | POA: Diagnosis not present

## 2018-06-21 DIAGNOSIS — J431 Panlobular emphysema: Secondary | ICD-10-CM | POA: Diagnosis not present

## 2018-06-21 DIAGNOSIS — I872 Venous insufficiency (chronic) (peripheral): Secondary | ICD-10-CM | POA: Diagnosis not present

## 2018-06-21 DIAGNOSIS — N183 Chronic kidney disease, stage 3 (moderate): Secondary | ICD-10-CM | POA: Diagnosis not present

## 2018-06-21 DIAGNOSIS — R05 Cough: Secondary | ICD-10-CM | POA: Diagnosis not present

## 2018-06-22 DIAGNOSIS — L03115 Cellulitis of right lower limb: Secondary | ICD-10-CM | POA: Diagnosis not present

## 2018-06-22 DIAGNOSIS — I129 Hypertensive chronic kidney disease with stage 1 through stage 4 chronic kidney disease, or unspecified chronic kidney disease: Secondary | ICD-10-CM | POA: Diagnosis not present

## 2018-06-22 DIAGNOSIS — B9689 Other specified bacterial agents as the cause of diseases classified elsewhere: Secondary | ICD-10-CM | POA: Diagnosis not present

## 2018-06-22 DIAGNOSIS — N183 Chronic kidney disease, stage 3 (moderate): Secondary | ICD-10-CM | POA: Diagnosis not present

## 2018-06-22 DIAGNOSIS — I872 Venous insufficiency (chronic) (peripheral): Secondary | ICD-10-CM | POA: Diagnosis not present

## 2018-06-22 DIAGNOSIS — Z87891 Personal history of nicotine dependence: Secondary | ICD-10-CM | POA: Diagnosis not present

## 2018-06-22 DIAGNOSIS — R7881 Bacteremia: Secondary | ICD-10-CM | POA: Diagnosis not present

## 2018-06-22 DIAGNOSIS — E1122 Type 2 diabetes mellitus with diabetic chronic kidney disease: Secondary | ICD-10-CM | POA: Diagnosis not present

## 2018-06-22 DIAGNOSIS — J449 Chronic obstructive pulmonary disease, unspecified: Secondary | ICD-10-CM | POA: Diagnosis not present

## 2018-06-24 ENCOUNTER — Telehealth: Payer: Self-pay

## 2018-06-24 DIAGNOSIS — E1122 Type 2 diabetes mellitus with diabetic chronic kidney disease: Secondary | ICD-10-CM | POA: Diagnosis not present

## 2018-06-24 DIAGNOSIS — I129 Hypertensive chronic kidney disease with stage 1 through stage 4 chronic kidney disease, or unspecified chronic kidney disease: Secondary | ICD-10-CM | POA: Diagnosis not present

## 2018-06-24 DIAGNOSIS — R7881 Bacteremia: Secondary | ICD-10-CM | POA: Diagnosis not present

## 2018-06-24 DIAGNOSIS — B9689 Other specified bacterial agents as the cause of diseases classified elsewhere: Secondary | ICD-10-CM | POA: Diagnosis not present

## 2018-06-24 DIAGNOSIS — J449 Chronic obstructive pulmonary disease, unspecified: Secondary | ICD-10-CM | POA: Diagnosis not present

## 2018-06-24 DIAGNOSIS — L03115 Cellulitis of right lower limb: Secondary | ICD-10-CM | POA: Diagnosis not present

## 2018-06-24 DIAGNOSIS — Z87891 Personal history of nicotine dependence: Secondary | ICD-10-CM | POA: Diagnosis not present

## 2018-06-24 DIAGNOSIS — I872 Venous insufficiency (chronic) (peripheral): Secondary | ICD-10-CM | POA: Diagnosis not present

## 2018-06-24 DIAGNOSIS — N183 Chronic kidney disease, stage 3 (moderate): Secondary | ICD-10-CM | POA: Diagnosis not present

## 2018-06-24 NOTE — Telephone Encounter (Signed)
Spoke with pt daughter, [name], and informed her of pt lab results and Dr. Georgeann Oppenheim instructions for pt to repeat CBC in 2 weeks, as well as, get a capsule study of the small bowel performed by [facility]. Pt agrees.

## 2018-06-24 NOTE — Telephone Encounter (Signed)
-----   Message from Jonathon Bellows, MD sent at 06/20/2018  9:27 AM EDT ----- Sherald Hess inform Hb stable- not really improved nor gone down. Will watch closely- repeat CBC in 2 weeks - also may need to get the capsule study at [facility] since our machine is down- dont think I want to wait for weeks till it gets repaired.   [name], MD   Dr Jonathon Bellows MD,MRCP [facility]) Gastroenterology/Hepatology Pager: [phone]

## 2018-06-25 DIAGNOSIS — R7881 Bacteremia: Secondary | ICD-10-CM | POA: Diagnosis not present

## 2018-06-25 DIAGNOSIS — E1122 Type 2 diabetes mellitus with diabetic chronic kidney disease: Secondary | ICD-10-CM | POA: Diagnosis not present

## 2018-06-25 DIAGNOSIS — I872 Venous insufficiency (chronic) (peripheral): Secondary | ICD-10-CM | POA: Diagnosis not present

## 2018-06-25 DIAGNOSIS — N183 Chronic kidney disease, stage 3 (moderate): Secondary | ICD-10-CM | POA: Diagnosis not present

## 2018-06-25 DIAGNOSIS — J449 Chronic obstructive pulmonary disease, unspecified: Secondary | ICD-10-CM | POA: Diagnosis not present

## 2018-06-25 DIAGNOSIS — I129 Hypertensive chronic kidney disease with stage 1 through stage 4 chronic kidney disease, or unspecified chronic kidney disease: Secondary | ICD-10-CM | POA: Diagnosis not present

## 2018-06-25 DIAGNOSIS — Z87891 Personal history of nicotine dependence: Secondary | ICD-10-CM | POA: Diagnosis not present

## 2018-06-25 DIAGNOSIS — L03115 Cellulitis of right lower limb: Secondary | ICD-10-CM | POA: Diagnosis not present

## 2018-06-25 DIAGNOSIS — B9689 Other specified bacterial agents as the cause of diseases classified elsewhere: Secondary | ICD-10-CM | POA: Diagnosis not present

## 2018-06-26 ENCOUNTER — Other Ambulatory Visit: Payer: Self-pay

## 2018-06-26 DIAGNOSIS — K921 Melena: Secondary | ICD-10-CM

## 2018-06-27 ENCOUNTER — Inpatient Hospital Stay
Admission: AD | Admit: 2018-06-27 | Discharge: 2018-07-05 | DRG: 811 | Disposition: A | Discharge status: Home / Self Care | Payer: Medicare HMO | Source: Ambulatory Visit | Attending: Internal Medicine | Admitting: Internal Medicine

## 2018-06-27 ENCOUNTER — Other Ambulatory Visit: Payer: Self-pay

## 2018-06-27 DIAGNOSIS — D500 Iron deficiency anemia secondary to blood loss (chronic): Secondary | ICD-10-CM | POA: Diagnosis present

## 2018-06-27 DIAGNOSIS — E1121 Type 2 diabetes mellitus with diabetic nephropathy: Secondary | ICD-10-CM | POA: Diagnosis not present

## 2018-06-27 DIAGNOSIS — K922 Gastrointestinal hemorrhage, unspecified: Secondary | ICD-10-CM

## 2018-06-27 DIAGNOSIS — J441 Chronic obstructive pulmonary disease with (acute) exacerbation: Secondary | ICD-10-CM | POA: Diagnosis not present

## 2018-06-27 DIAGNOSIS — R05 Cough: Secondary | ICD-10-CM

## 2018-06-27 DIAGNOSIS — I5031 Acute diastolic (congestive) heart failure: Secondary | ICD-10-CM | POA: Diagnosis not present

## 2018-06-27 DIAGNOSIS — L03116 Cellulitis of left lower limb: Secondary | ICD-10-CM | POA: Diagnosis not present

## 2018-06-27 DIAGNOSIS — Z20828 Contact with and (suspected) exposure to other viral communicable diseases: Secondary | ICD-10-CM | POA: Diagnosis not present

## 2018-06-27 DIAGNOSIS — L03115 Cellulitis of right lower limb: Secondary | ICD-10-CM | POA: Diagnosis present

## 2018-06-27 DIAGNOSIS — Z66 Do not resuscitate: Secondary | ICD-10-CM | POA: Diagnosis present

## 2018-06-27 DIAGNOSIS — F17220 Nicotine dependence, chewing tobacco, uncomplicated: Secondary | ICD-10-CM | POA: Diagnosis present

## 2018-06-27 DIAGNOSIS — E785 Hyperlipidemia, unspecified: Secondary | ICD-10-CM | POA: Diagnosis present

## 2018-06-27 DIAGNOSIS — D509 Iron deficiency anemia, unspecified: Secondary | ICD-10-CM | POA: Diagnosis not present

## 2018-06-27 DIAGNOSIS — R059 Cough, unspecified: Secondary | ICD-10-CM

## 2018-06-27 DIAGNOSIS — Z885 Allergy status to narcotic agent status: Secondary | ICD-10-CM | POA: Diagnosis not present

## 2018-06-27 DIAGNOSIS — D649 Anemia, unspecified: Secondary | ICD-10-CM | POA: Diagnosis not present

## 2018-06-27 DIAGNOSIS — Z792 Long term (current) use of antibiotics: Secondary | ICD-10-CM

## 2018-06-27 DIAGNOSIS — E11622 Type 2 diabetes mellitus with other skin ulcer: Secondary | ICD-10-CM | POA: Diagnosis not present

## 2018-06-27 DIAGNOSIS — Z8619 Personal history of other infectious and parasitic diseases: Secondary | ICD-10-CM

## 2018-06-27 DIAGNOSIS — E1122 Type 2 diabetes mellitus with diabetic chronic kidney disease: Secondary | ICD-10-CM | POA: Diagnosis not present

## 2018-06-27 DIAGNOSIS — L97919 Non-pressure chronic ulcer of unspecified part of right lower leg with unspecified severity: Secondary | ICD-10-CM | POA: Diagnosis not present

## 2018-06-27 DIAGNOSIS — E1151 Type 2 diabetes mellitus with diabetic peripheral angiopathy without gangrene: Secondary | ICD-10-CM

## 2018-06-27 DIAGNOSIS — Z6838 Body mass index (BMI) 38.0-38.9, adult: Secondary | ICD-10-CM

## 2018-06-27 DIAGNOSIS — E876 Hypokalemia: Secondary | ICD-10-CM | POA: Diagnosis not present

## 2018-06-27 DIAGNOSIS — Z794 Long term (current) use of insulin: Secondary | ICD-10-CM | POA: Diagnosis not present

## 2018-06-27 DIAGNOSIS — D62 Acute posthemorrhagic anemia: Secondary | ICD-10-CM | POA: Diagnosis not present

## 2018-06-27 DIAGNOSIS — Z87891 Personal history of nicotine dependence: Secondary | ICD-10-CM

## 2018-06-27 DIAGNOSIS — J9611 Chronic respiratory failure with hypoxia: Secondary | ICD-10-CM | POA: Diagnosis not present

## 2018-06-27 DIAGNOSIS — K921 Melena: Secondary | ICD-10-CM | POA: Diagnosis present

## 2018-06-27 DIAGNOSIS — N183 Chronic kidney disease, stage 3 (moderate): Secondary | ICD-10-CM | POA: Diagnosis present

## 2018-06-27 DIAGNOSIS — J9621 Acute and chronic respiratory failure with hypoxia: Secondary | ICD-10-CM | POA: Diagnosis not present

## 2018-06-27 DIAGNOSIS — R008 Other abnormalities of heart beat: Secondary | ICD-10-CM | POA: Diagnosis not present

## 2018-06-27 DIAGNOSIS — R0600 Dyspnea, unspecified: Secondary | ICD-10-CM | POA: Diagnosis not present

## 2018-06-27 DIAGNOSIS — N189 Chronic kidney disease, unspecified: Secondary | ICD-10-CM | POA: Diagnosis present

## 2018-06-27 DIAGNOSIS — Z89021 Acquired absence of right finger(s): Secondary | ICD-10-CM | POA: Diagnosis not present

## 2018-06-27 DIAGNOSIS — N400 Enlarged prostate without lower urinary tract symptoms: Secondary | ICD-10-CM | POA: Diagnosis present

## 2018-06-27 DIAGNOSIS — J449 Chronic obstructive pulmonary disease, unspecified: Secondary | ICD-10-CM | POA: Diagnosis present

## 2018-06-27 DIAGNOSIS — D631 Anemia in chronic kidney disease: Secondary | ICD-10-CM | POA: Diagnosis present

## 2018-06-27 DIAGNOSIS — I129 Hypertensive chronic kidney disease with stage 1 through stage 4 chronic kidney disease, or unspecified chronic kidney disease: Secondary | ICD-10-CM | POA: Diagnosis present

## 2018-06-27 DIAGNOSIS — M7989 Other specified soft tissue disorders: Secondary | ICD-10-CM

## 2018-06-27 DIAGNOSIS — G4733 Obstructive sleep apnea (adult) (pediatric): Secondary | ICD-10-CM | POA: Diagnosis present

## 2018-06-27 LAB — COMPREHENSIVE METABOLIC PANEL
ALT: 14 U/L (ref 0–44)
AST: 18 U/L (ref 15–41)
Albumin: 3 g/dL — ABNORMAL LOW (ref 3.5–5.0)
Alkaline Phosphatase: 44 U/L (ref 38–126)
Anion gap: 9 (ref 5–15)
BUN: 28 mg/dL — ABNORMAL HIGH (ref 8–23)
CO2: 32 mmol/L (ref 22–32)
Calcium: 8 mg/dL — ABNORMAL LOW (ref 8.9–10.3)
Chloride: 101 mmol/L (ref 98–111)
Creatinine, Ser: 1.96 mg/dL — ABNORMAL HIGH (ref 0.61–1.24)
GFR calc Af Amer: 36 mL/min — ABNORMAL LOW (ref >60)
GFR calc non Af Amer: 31 mL/min — ABNORMAL LOW (ref >60)
Glucose, Bld: 98 mg/dL (ref 70–99)
Potassium: 2.8 mmol/L — ABNORMAL LOW (ref 3.5–5.1)
Sodium: 142 mmol/L (ref 135–145)
Total Bilirubin: 0.5 mg/dL (ref 0.3–1.2)
Total Protein: 6 g/dL — ABNORMAL LOW (ref 6.5–8.1)

## 2018-06-27 LAB — GLUCOSE, CAPILLARY
Glucose-Capillary: 95 mg/dL (ref 70–99)
Glucose-Capillary: 97 mg/dL (ref 70–99)
Glucose-Capillary: 128 mg/dL — ABNORMAL HIGH (ref 70–99)

## 2018-06-27 LAB — PREPARE RBC (CROSSMATCH)

## 2018-06-27 LAB — SARS CORONAVIRUS 2 BY RT PCR (HOSPITAL ORDER, PERFORMED IN ~~LOC~~ HOSPITAL LAB): SARS Coronavirus 2: NEGATIVE

## 2018-06-27 MED ORDER — ALBUTEROL SULFATE HFA 108 (90 BASE) MCG/ACT IN AERS: 2.0000 | INHALATION_SPRAY | Freq: Four times a day (QID) | RESPIRATORY_TRACT | Status: DC | PRN

## 2018-06-27 MED ORDER — INSULIN ASPART 100 UNIT/ML ~~LOC~~ SOLN
0.0000 [IU] | Freq: Three times a day (TID) | SUBCUTANEOUS | Status: DC
  Administered 2018-06-28: 2 [IU] via SUBCUTANEOUS
  Administered 2018-06-28: 13:00:00 1 [IU] via SUBCUTANEOUS
  Administered 2018-06-28 – 2018-06-29 (×4): 2 [IU] via SUBCUTANEOUS
  Administered 2018-06-30: 08:00:00 1 [IU] via SUBCUTANEOUS
  Administered 2018-06-30: 3 [IU] via SUBCUTANEOUS
  Administered 2018-06-30: 12:00:00 1 [IU] via SUBCUTANEOUS
  Administered 2018-07-01: 2 [IU] via SUBCUTANEOUS
  Administered 2018-07-01: 1 [IU] via SUBCUTANEOUS
  Administered 2018-07-01: 2 [IU] via SUBCUTANEOUS
  Administered 2018-07-02: 1 [IU] via SUBCUTANEOUS
  Administered 2018-07-02 – 2018-07-03 (×2): 2 [IU] via SUBCUTANEOUS
  Administered 2018-07-03: 3 [IU] via SUBCUTANEOUS
  Administered 2018-07-03: 08:00:00 1 [IU] via SUBCUTANEOUS
  Administered 2018-07-04: 2 [IU] via SUBCUTANEOUS
  Administered 2018-07-04: 1 [IU] via SUBCUTANEOUS
  Administered 2018-07-04 – 2018-07-05 (×2): 2 [IU] via SUBCUTANEOUS
  Administered 2018-07-05: 5 [IU] via SUBCUTANEOUS
  Filled 2018-06-27 (×22): qty 1

## 2018-06-27 MED ORDER — ALBUTEROL SULFATE (2.5 MG/3ML) 0.083% IN NEBU: 2.5000 mg | INHALATION_SOLUTION | Freq: Four times a day (QID) | RESPIRATORY_TRACT | Status: DC | PRN

## 2018-06-27 MED ORDER — TAMSULOSIN HCL 0.4 MG PO CAPS
0.4000 mg | ORAL_CAPSULE | Freq: Every day | ORAL | Status: DC
  Administered 2018-06-28 – 2018-07-05 (×8): 0.4 mg via ORAL
  Filled 2018-06-27 (×8): qty 1

## 2018-06-27 MED ORDER — INSULIN ASPART 100 UNIT/ML ~~LOC~~ SOLN: 0.0000 [IU] | Freq: Every day | SUBCUTANEOUS | Status: DC

## 2018-06-27 MED ORDER — POTASSIUM CHLORIDE CRYS ER 20 MEQ PO TBCR
40.0000 meq | EXTENDED_RELEASE_TABLET | Freq: Two times a day (BID) | ORAL | Status: AC
  Administered 2018-06-27 – 2018-06-28 (×2): 40 meq via ORAL
  Filled 2018-06-27 (×2): qty 2

## 2018-06-27 MED ORDER — MAGNESIUM CITRATE PO SOLN
1.0000 | Freq: Once | ORAL | Status: AC
  Administered 2018-06-27: 1 via ORAL
  Filled 2018-06-27: qty 296

## 2018-06-27 MED ORDER — AMLODIPINE BESYLATE 5 MG PO TABS
2.5000 mg | ORAL_TABLET | Freq: Every day | ORAL | Status: DC
  Administered 2018-06-28 – 2018-07-01 (×4): 2.5 mg via ORAL
  Filled 2018-06-27 (×4): qty 1

## 2018-06-27 MED ORDER — SODIUM CHLORIDE 0.9 % IV SOLN
80.0000 mg | Freq: Once | INTRAVENOUS | Status: AC
  Administered 2018-06-27: 80 mg via INTRAVENOUS
  Filled 2018-06-27: qty 80

## 2018-06-27 MED ORDER — PRAVASTATIN SODIUM 20 MG PO TABS
10.0000 mg | ORAL_TABLET | Freq: Every day | ORAL | Status: DC
  Administered 2018-06-27 – 2018-07-04 (×7): 10 mg via ORAL
  Filled 2018-06-27 (×7): qty 1

## 2018-06-27 MED ORDER — PANTOPRAZOLE SODIUM 40 MG IV SOLR
40.0000 mg | Freq: Two times a day (BID) | INTRAVENOUS | Status: DC
  Administered 2018-06-30 – 2018-07-05 (×10): 40 mg via INTRAVENOUS
  Filled 2018-06-27 (×10): qty 40

## 2018-06-27 MED ORDER — GUAIFENESIN-DM 100-10 MG/5ML PO SYRP: 5.0000 mL | ORAL_SOLUTION | ORAL | Status: DC | PRN

## 2018-06-27 MED ORDER — IPRATROPIUM-ALBUTEROL 0.5-2.5 (3) MG/3ML IN SOLN: 3.0000 mL | Freq: Four times a day (QID) | RESPIRATORY_TRACT | Status: DC | PRN

## 2018-06-27 MED ORDER — SODIUM CHLORIDE 0.9 % IV SOLN
8.0000 mg/h | INTRAVENOUS | Status: AC
  Administered 2018-06-27 – 2018-06-30 (×6): 8 mg/h via INTRAVENOUS
  Filled 2018-06-27 (×5): qty 80

## 2018-06-27 MED ORDER — CLINDAMYCIN PHOSPHATE 600 MG/50ML IV SOLN
600.0000 mg | Freq: Three times a day (TID) | INTRAVENOUS | Status: DC
  Filled 2018-06-27 (×3): qty 50

## 2018-06-27 MED ORDER — NYSTATIN 100000 UNIT/GM EX CREA
TOPICAL_CREAM | Freq: Two times a day (BID) | CUTANEOUS | Status: DC
  Administered 2018-06-28 – 2018-07-05 (×9): via TOPICAL
  Filled 2018-06-27 (×2): qty 15

## 2018-06-27 MED ORDER — SODIUM CHLORIDE 0.9% IV SOLUTION
Freq: Once | INTRAVENOUS | Status: AC
  Administered 2018-06-27: 17:00:00 via INTRAVENOUS

## 2018-06-27 NOTE — Consult Note (Signed)
NAME: Arthur [name]  DOB: 24-Dec-1933  MRN: [id number]  Date/Time: 06/27/2018 5:31 PM  REQUESTING PROVIDER Arthur [name]:  REASON FOR CONSULT: b/l leg cellulitis ? Arthur [name] is [name] 85 y.o. with [name] history of DM, Stasis edema legs, recent [facility] bacteremia in May 2022 and treated with IV antibiotics, another admission in June for GI bleed when his HB was 3.6  is readmitted for anemia Pt says he has no fever or chills His legs are swollen and he always have erythema over the shins He has been on Doxy since 6/20 given by his PCP He sits with his legs dangling and does not wear compression stockings as it is tight   PMH DM with nephropathy Stasis edema and stasis ulcer leg rt Anemia HTN HLD BPH COPD Morbid obesity CKD  PSH Amputation rt index finger  SH : former smoker Chewed tobacco  FH DM father and mother  Social History   Socioeconomic History  . Marital status: Widowed    Spouse name: Not on file  . Number of children: Not on file  . Years of education: Not on file  . Highest education level: Not on file  Occupational History  . Not on file  Social Needs  . Financial resource strain: Not on file  . Food insecurity    Worry: Not on file    Inability: Not on file  . Transportation needs    Medical: No    Non-medical: No  Tobacco Use  . Smoking status: Former Smoker    Quit date: 11/04/1982    Years since quitting: 35.6  . Smokeless tobacco: Current User  Substance and Sexual Activity  . Alcohol use: Yes    Comment: 1/5 /wk  . Drug use: No  . Sexual activity: Not Currently  Lifestyle  . Physical activity    Days per week: 0 days    Minutes per session: Not on file  . Stress: Not at all  Relationships  . Social Herbalist on phone: Not on file    Gets together: Not on file    Attends religious service: Not on file    Active member of club or organization: Not on file    Attends meetings of clubs or organizations: Not on file   Relationship status: Not on file  . Intimate partner violence    Fear of current or ex partner: Patient refused    Emotionally abused: Patient refused    Physically abused: Patient refused    Forced sexual activity: Patient refused  Other Topics Concern  . Not on file  Social History Narrative  . Not on file    No family history on file. Allergies  Allergen Reactions  . Codeine Other (See Comments)    Constipation    Current Facility-Administered Medications  Medication Dose Route Frequency Provider Last Rate Last Dose  . albuterol (PROVENTIL) (2.5 MG/3ML) 0.083% nebulizer solution 2.5 mg  2.5 mg Nebulization Q6H PRN Arthur [name], Arthur [name], Arthur [name]      . amLODipine (NORVASC) tablet 2.5 mg  2.5 mg Oral Daily Arthur [name], Arthur Neighbors, Arthur [name]      . clindamycin (CLEOCIN) IVPB 600 mg  600 mg Intravenous Q8H Arthur [name], Arthur Neighbors, Arthur [name]      . guaiFENesin-dextromethorphan (ROBITUSSIN DM) 100-10 MG/5ML syrup 5 mL  5 mL Oral Q4H PRN Arthur Snow, Arthur [name]      . insulin aspart (novoLOG) injection 0-5 Units  0-5 Units Subcutaneous QHS Arthur [name]  Arthur [name]      . insulin aspart (novoLOG) injection 0-9 Units  0-9 Units Subcutaneous TID WC Arthur [name], Arthur Neighbors, Arthur [name]      . ipratropium-albuterol (DUONEB) 0.5-2.5 (3) MG/3ML nebulizer solution 3 mL  3 mL Nebulization Q6H PRN Arthur Snow, Arthur [name]      . magnesium citrate solution 1 Bottle  1 Bottle Oral Once Arthur Lame, Arthur [name]      . nystatin cream (MYCOSTATIN)   Topical BID Arthur Snow, Arthur [name]      . pantoprazole (PROTONIX) 80 mg in sodium chloride 0.9 % 250 mL (0.32 mg/mL) infusion  8 mg/hr Intravenous Continuous Arthur Snow, Arthur [name] 25 mL/hr at 06/27/18 1622 8 mg/hr at 06/27/18 1622  . [START ON 06/30/2018] pantoprazole (PROTONIX) injection 40 mg  40 mg Intravenous Q12H Arthur [name], Arthur Neighbors, Arthur [name]      . potassium chloride SA (K-DUR) CR tablet 40 mEq  40 mEq Oral BID Arthur Snow, Arthur [name]      . pravastatin (PRAVACHOL)  tablet 10 mg  10 mg Oral QHS Arthur Snow, Arthur [name]      . tamsulosin (FLOMAX) capsule 0.4 mg  0.4 mg Oral Daily Arthur [name], Arthur Neighbors, Arthur [name]         Abtx:  Anti-infectives (From admission, onward)   Start     Dose/Rate Route Frequency Ordered Stop   06/27/18 1800  clindamycin (CLEOCIN) IVPB 600 mg     600 mg 100 mL/hr over 30 Minutes Intravenous Every 8 hours 06/27/18 1645        REVIEW OF SYSTEMS:  Const: negative fever, negative chills, negative weight loss Eyes: negative diplopia or visual changes, negative eye pain ENT: negative coryza, negative sore throat Resp: negative cough, hemoptysis, dyspnea Cards: negative for chest pain, palpitations, lower extremity edema GU: negative for frequency, dysuria and hematuria GI: Negative for abdominal pain, diarrhea, has black stools bleeding, constipation Skin: negative for rash and pruritus Heme: negative for easy bruising and gum/nose bleeding MS: negative for myalgias, arthralgias, back pain and muscle weakness Neurolo:negative for headaches, dizziness, vertigo, memory problems  Psych: negative for feelings of anxiety, depression  Endocrine: negative for thyroid, diabetes Allergy/Immunology- negative for any medication or food allergies ? Pertinent Positives include : Objective:  VITALS:  BP (!) 160/54 (BP Location: Right Arm)   Pulse 85   Temp 98 F (36.7 C) (Oral)   Resp 19   SpO2 98%  PHYSICAL EXAM:  General: Alert, cooperative, no distress, appears stated age.  Head: Normocephalic, without obvious abnormality, atraumatic. Eyes: Conjunctivae clear, anicteric sclerae. Pupils are equal ENT Nares normal. No drainage or sinus tenderness. Lips, mucosa, and tongue normal. No Thrush Neck: Supple, symmetrical, no adenopathy, thyroid: non tender no carotid bruit and no JVD. Back: No CVA tenderness. Lungs: Clear to auscultation bilaterally. No Wheezing or Rhonchi. No rales. Heart: Regular rate and rhythm, no murmur, rub or  gallop. Abdomen: Soft, non-tender,not distended. Bowel sounds normal. No masses Extremities: atraumatic, no cyanosis. No edema. No clubbing Skin: No rashes or lesions. Or bruising Lymph: Cervical, supraclavicular normal. Neurologic: Grossly non-focal Pertinent Labs Lab Results CBC    Component Value Date/Time   WBC 6.1 06/19/2018 1130   WBC 26.8 (H) 06/14/2018 0646   RBC 2.50 (LL) 06/19/2018 1130   RBC 2.55 (L) 06/14/2018 0646   HGB 7.2 (L) 06/19/2018 1130   HCT 23.3 (L) 06/19/2018 1130   PLT 215 06/19/2018 1130   MCV 93 06/19/2018 1130   MCH 28.8 06/19/2018 1130  MCH 29.4 06/14/2018 0646   MCHC 30.9 (L) 06/19/2018 1130   MCHC 30.2 06/14/2018 0646   RDW 17.2 (H) 06/19/2018 1130   LYMPHSABS 1.0 06/19/2018 1130   MONOABS 0.9 06/11/2018 0337   EOSABS 0.3 06/19/2018 1130   BASOSABS 0.0 06/19/2018 1130    CMP Latest Ref Rng & Units 06/27/2018 06/13/2018 06/12/2018  Glucose 70 - 99 mg/dL 98 140(H) 147(H)  BUN 8 - 23 mg/dL 28(H) 41(H) 54(H)  Creatinine 0.61 - 1.24 mg/dL 1.96(H) 2.01(H) 2.38(H)  Sodium 135 - 145 mmol/L 142 143 141  Potassium 3.5 - 5.1 mmol/L 2.8(L) 4.4 4.7  Chloride 98 - 111 mmol/L 101 106 105  CO2 22 - 32 mmol/L 32 31 28  Calcium 8.9 - 10.3 mg/dL 8.0(L) 7.9(L) 8.0(L)  Total Protein 6.5 - 8.1 g/dL 6.0(L) - -  Total Bilirubin 0.3 - 1.2 mg/dL 0.5 - -  Alkaline Phos 38 - 126 U/L 44 - -  AST 15 - 41 U/L 18 - -  ALT 0 - 44 U/L 14 - -      Microbiology: No results found for this or any previous visit (from the past 240 hour(s)).  IMAGING RESULTS: I have personally reviewed the films ? Impression/Recommendation  Anemia- due to GI bleed- recent colonoscopy and endoscopy was normal waiting to have capsule study ? ?B/l leg swelling and redness due to venous stasis- less likely  cellulitis-  Will keep the leg elevated on 3 pillows- also applied coban compression- will check the legs tomorrow to see for improvement  In May 2020 he had strep bacteremia which was  thought to be from the foot -  DC clindamycin as no fever or leucocytosis and he was on DOXY for nearly 5 days with no improvement ? ___________________________________________________ Discussed with patient, requesting provider

## 2018-06-27 NOTE — Progress Notes (Addendum)
2032 : Pt second blood was started with a volume of 310 ml. Pt tolerating well. Pt states want to take magnesium citrate solution after the second blood transfusion. Will continuet to monitor.  Update 2323: Pt blood transfusion finished. Pt tolerated well. Will continue to monitor.  Update 0505: Pt potassium at 3.2 this morning from 2.8. Pt have a last dose schedule of potassium at 1000 this morning. Notify prime. Will continue to monitor.

## 2018-06-27 NOTE — Progress Notes (Signed)
Patient direct admit to 2A.  Oriented to room and surroundings. Resting comfortably at this time.

## 2018-06-27 NOTE — Progress Notes (Signed)
Patient first unit of blood complete. Resting comfortably.  No complaints at this time.

## 2018-06-27 NOTE — H&P (Addendum)
[facility] [place] NAME: Arthur [name]    MR#:  [id number]  DATE OF BIRTH:  07/24/1933  DATE OF ADMISSION:  06/27/2018  PRIMARY CARE PHYSICIAN: Baxter Hire, MD   REQUESTING/REFERRING PHYSICIAN: Ottie Glazier, MD   CHIEF COMPLAINT:  GI bleed  HISTORY OF PRESENT ILLNESS:  85 y.o. male past medical history of BPH (benign prostatic hyperplasia), Chronic kidney disease (CKD), stage III (moderate),COPD, Hyperlipidemia, Hypertension, Morbid obesity (CMS-HCC), and OSA (obstructive sleep apnea) presenting as direct admit from pulmonary clinic with hgb 5.8 and lower extremities cellulitis.  Patient was recently admitted on 06/09/2018 with severe anemia with hemoglobin of 3.6 and bilateral lower extremity cellulitis.  He was evaluated by GI status post colonoscopy x2 and EGD without a source of bleeding identified.  He also had a capsule endoscopy scheduled for outpatient but not already performed.  His course was complicated by recent strep B bacteremia and lower extremities cellulitis treated with cefepime and vancomycin on inpatient and doxy as outpatient.  Today he had an appointment with his pulmonary doctor and PCP and was noted to have decreased hemoglobin of 5.8 as such plans were made for direct admission to address his acute on chronic severe anemia and recurrent cellulitis.  On arrival to the unit, he was afebrile with blood pressure 160/53 mm Hg and pulse rate 80 beats/min, sats 98% on 2 L of nasal cannula. There were no focal neurological deficits; he was alert and oriented x4, and he did not demonstrate any memory deficits.  Initial labs revealed potassium 2.8 with renal function at baseline.  PAST MEDICAL HISTORY:   Past Medical History:  Diagnosis Date  . COPD (chronic obstructive pulmonary disease) ([place])   . Diabetes mellitus without complication ([place])   . Hyperlipidemia   . Hypertension     PAST SURGICAL HISTORY:   Past  Surgical History:  Procedure Laterality Date  . COLONOSCOPY WITH PROPOFOL N/A 06/11/2018   Procedure: COLONOSCOPY WITH PROPOFOL;  Surgeon: Jonathon Bellows, MD;  Location: [facility] ENDOSCOPY;  Service: Gastroenterology;  Laterality: N/A;  . COLONOSCOPY WITH PROPOFOL N/A 06/13/2018   Procedure: COLONOSCOPY WITH PROPOFOL;  Surgeon: Jonathon Bellows, MD;  Location: [facility] ENDOSCOPY;  Service: Gastroenterology;  Laterality: N/A;  . ESOPHAGOGASTRODUODENOSCOPY Left 06/10/2018   Procedure: ESOPHAGOGASTRODUODENOSCOPY (EGD);  Surgeon: Jonathon Bellows, MD;  Location: [facility] ENDOSCOPY;  Service: Gastroenterology;  Laterality: Left;    SOCIAL HISTORY:   Social History   Tobacco Use  . Smoking status: Former Smoker    Quit date: 11/04/1982    Years since quitting: 35.6  . Smokeless tobacco: Current User  Substance Use Topics  . Alcohol use: Yes    Comment: 1/5 /wk    FAMILY HISTORY:  No family history on file.  DRUG ALLERGIES:   Allergies  Allergen Reactions  . Codeine Other (See Comments)    Constipation    REVIEW OF SYSTEMS:   Review of Systems  Constitutional: Negative for chills, fever, malaise/fatigue and weight loss.  HENT: Negative for congestion, hearing loss and sore throat.   Eyes: Negative for blurred vision and double vision.  Respiratory: Positive for shortness of breath.   Cardiovascular: Positive for orthopnea and leg swelling. Negative for chest pain and palpitations.  Gastrointestinal: Positive for melena. Negative for diarrhea, nausea and vomiting.       Black tarry stools  Genitourinary: Negative for dysuria, hematuria and urgency.  Musculoskeletal: Negative for myalgias.  Skin: Negative for rash.  Neurological: Negative for  dizziness, sensory change, speech change, focal weakness and headaches.  Psychiatric/Behavioral: Negative for depression.   MEDICATIONS AT HOME:   Prior to Admission medications   Medication Sig Start Date End Date Taking? Authorizing Provider  albuterol  (VENTOLIN HFA) 108 (90 Base) MCG/ACT inhaler Inhale 2 puffs into the lungs every 6 (six) hours as needed for wheezing or shortness of breath. 05/27/18  Yes Demetrios Loll, MD  amLODipine (NORVASC) 2.5 MG tablet Take 2.5 mg by mouth daily. 04/26/18  Yes [provider]  furosemide (LASIX) 20 MG tablet Take 20 mg by mouth daily as needed for edema. 04/25/18  Yes [provider]  guaiFENesin-dextromethorphan (ROBITUSSIN DM) 100-10 MG/5ML syrup Take 5 mLs by mouth every 4 (four) hours as needed for cough. 05/27/18  Yes Demetrios Loll, MD  insulin aspart (NOVOLOG) 100 UNIT/ML injection Inject 20 Units into the skin 3 (three) times daily before meals.   Yes [provider]  ipratropium-albuterol (DUONEB) 0.5-2.5 (3) MG/3ML SOLN Take 3 mLs by nebulization every 6 (six) hours as needed. 05/28/18  Yes Mody, Sital, MD  LEVEMIR 100 UNIT/ML injection Inject 0.12 mLs (12 Units total) into the skin daily. Patient taking differently: Inject 40 Units into the skin at bedtime.  05/27/18  Yes Demetrios Loll, MD  nystatin cream (MYCOSTATIN) Apply topically 2 (two) times daily. 05/28/18  Yes Mody, Ulice Bold, MD  pravastatin (PRAVACHOL) 10 MG tablet Take 10 mg by mouth at bedtime. 04/25/18  Yes [provider]  tamsulosin (FLOMAX) 0.4 MG CAPS capsule Take 0.4 mg by mouth daily. 04/25/18  Yes [provider]      VITAL SIGNS:  Blood pressure (!) 160/53, pulse 79, temperature 98.4 F (36.9 C), temperature source Oral, resp. rate 19, SpO2 98 %.  PHYSICAL EXAMINATION:   Physical Exam  GENERAL:  85 y.o.-year-old patient lying in the bed with no acute distress.  EYES: Pupils equal, round, reactive to light and accommodation. No scleral icterus. Extraocular muscles intact.  HEENT: Head atraumatic, normocephalic. Oropharynx and nasopharynx clear.  NECK:  Supple, no jugular venous distention. No thyroid enlargement, no tenderness.  LUNGS: Normal breath sounds bilaterally, no wheezing, rales,rhonchi  or crepitation. No use of accessory muscles of respiration.  CARDIOVASCULAR: S1, S2 normal. No murmurs, rubs, or gallops.  ABDOMEN: Distended, non-tender to palpation. Bowel sounds present. No organomegaly or mass.  EXTREMITIES: Bilateral pitting edema of lower extremities with anterior erythema, tenderness present.  No cyanosis, or clubbing. No rash or lesions. + pedal pulses MUSCULOSKELETAL: Normal bulk, and power was 5+ grip and elbow, knee, and ankle flexion and extension bilaterally.  NEUROLOGIC:Alert and oriented x 3. CN 2-12 intact. Sensation to light touch and cold stimuli intact bilaterally. Finger to nose nl. Babinski is downgoing. DTR's (biceps, patellar, and achilles) 2+ and symmetric throughout. Gait not tested due to safety concern. PSYCHIATRIC: The patient is alert and oriented x 3.  SKIN: As below     DATA REVIEWED:  LABORATORY PANEL:   CBC No results for input(s): WBC, HGB, HCT, PLT in the last 168 hours. ------------------------------------------------------------------------------------------------------------------  Chemistries  Recent Labs  Lab 06/27/18 1311  NA 142  K 2.8*  CL 101  CO2 32  GLUCOSE 98  BUN 28*  CREATININE 1.96*  CALCIUM 8.0*  AST 18  ALT 14  ALKPHOS 44  BILITOT 0.5   ------------------------------------------------------------------------------------------------------------------  Cardiac Enzymes No results for input(s): TROPONINI in the last 168 hours. ------------------------------------------------------------------------------------------------------------------  RADIOLOGY:  No results found.  EKG:  Prior EKG: normal EKG, normal sinus  rhythm, RBBB. Junctional rhythm with a rate of 81 bpm, right bundle branch block, normal axis Vent. rate 81 BPM PR interval * ms QRS duration 148 ms QT/QTc 396/460 ms P-R-T axes * 50 43  IMPRESSION AND PLAN:   85 y.o. male past medical history of BPH (benign prostatic hyperplasia),  Chronic kidney disease (CKD), stage III (moderate),COPD, Hyperlipidemia, Hypertension, Morbid obesity (CMS-HCC), and OSA (obstructive sleep apnea) presenting as direct admit from pulmonary clinic with hgb 5.8 and lower extremities cellulitis.  1. Chronic GI Bleed - s/p recent EGD and colonoscopy x2  without a source of bleed identified. - Admit to MedSurg unit with telemetry monitoring - Transfuse with 2 units of PRBC - H&H monitoring q6h - Transfuse PRN Hgb<7 - Start pantoprazole 80mg  IV x1 then gtt 8mg /hr - Will hold off further imaging since he has had recent work up including CT abdomen, - Clear liquid for capsule study in the am - GI Consult message sent via Haiku to Dr. Allen Norris - Hold NSAIDs, steroids, ASA  2. Acute on Chronic severe anemia -likely secondary to GI bleed as above - Hemoglobin 5.8 - Pending blood transfusion - Recent work up including iron panel, TIBC, feritin, B12 and folate  3. Bilateral lower extremity cellulitis - Recurrent cellulitis and recent strep B bacteremia - Previous work-up with ultrasound negative for DVT - Recently treated with broad-spectrum antibiotic coverage with cefepime and vancomycin on 6/7 - Recent treatment with Doxy - Will start Clindamycin - Pending blood cultures - ID consult for further guidance with abx. Message sent via Haiku to Dr. Delaine Lame  4. Hypokalemia - IV replacement - Recheck in the am  5. DM: His sugars have not been well-controlled on current regimen  - Hold home meds - We will start sliding scale insulin coverage  - ADA 2100 calorie diet  - FS BS qac and qhs   6. Chronic kidney disease -BUN and creatinine improved from baseline - Continue to monitor renal function  7. Advanced COPD= no evidence of exacerbation  - DuoNeb every 6 hours  - Incentive spirometry every hour while awake  - Patient has O2 per nasal cannula at 2 L/min - Follows with pulmonary as outpatient  8. HLD = + Goal LDL<70 -Pravastatin 10 mg   9. HTN- slightly elevated + Goal BP <140/90 - Continue amlodipine   Management plans discussed with the patient, family and they are in agreement.  CODE STATUS: DNR  TOTAL TIME TAKING CARE OF THIS PATIENT: 45 minutes.   on 06/27/2018 at 3:54 PM  This patient was staffed with Dr. Posey Pronto, [facility] who personally evaluated patient, reviewed documentation and agreed with assessment and plan of care as above.  Rufina Falco, DNP, FNP-BC [facility] Nurse Practitioner Between 7am to 6pm - Pager [phone]  After 6pm go to [email or web address] - password [email or web address] [facility]  Office  [phone]  CC: Primary care physician; Baxter Hire, MD

## 2018-06-28 ENCOUNTER — Encounter
Admission: AD | Disposition: A | Discharge status: Home / Self Care | Payer: Self-pay | Source: Ambulatory Visit | Attending: Internal Medicine

## 2018-06-28 ENCOUNTER — Encounter: Payer: Self-pay | Admitting: Gastroenterology

## 2018-06-28 HISTORY — PX: GIVENS CAPSULE STUDY: SHX5432

## 2018-06-28 LAB — CBC WITH DIFFERENTIAL/PLATELET
Abs Immature Granulocytes: 0.06 10*3/uL (ref 0.00–0.07)
Basophils Absolute: 0.1 10*3/uL (ref 0.0–0.1)
Basophils Relative: 1 %
Eosinophils Absolute: 0.2 10*3/uL (ref 0.0–0.5)
Eosinophils Relative: 3 %
HCT: 23.3 % — ABNORMAL LOW (ref 39.0–52.0)
Hemoglobin: 7.2 g/dL — ABNORMAL LOW (ref 13.0–17.0)
Immature Granulocytes: 1 %
Lymphocytes Relative: 19 %
Lymphs Abs: 1.2 10*3/uL (ref 0.7–4.0)
MCH: 29.1 pg (ref 26.0–34.0)
MCHC: 30.9 g/dL (ref 30.0–36.0)
MCV: 94.3 fL (ref 80.0–100.0)
Monocytes Absolute: 0.5 10*3/uL (ref 0.1–1.0)
Monocytes Relative: 8 %
Neutro Abs: 4.3 10*3/uL (ref 1.7–7.7)
Neutrophils Relative %: 68 %
Platelets: 299 10*3/uL (ref 150–400)
RBC: 2.47 MIL/uL — ABNORMAL LOW (ref 4.22–5.81)
RDW: 19.9 % — ABNORMAL HIGH (ref 11.5–15.5)
WBC: 6.4 10*3/uL (ref 4.0–10.5)
nRBC: 0.9 % — ABNORMAL HIGH (ref 0.0–0.2)

## 2018-06-28 LAB — BASIC METABOLIC PANEL
Anion gap: 7 (ref 5–15)
BUN: 26 mg/dL — ABNORMAL HIGH (ref 8–23)
CO2: 31 mmol/L (ref 22–32)
Calcium: 7.7 mg/dL — ABNORMAL LOW (ref 8.9–10.3)
Chloride: 102 mmol/L (ref 98–111)
Creatinine, Ser: 1.98 mg/dL — ABNORMAL HIGH (ref 0.61–1.24)
GFR calc Af Amer: 35 mL/min — ABNORMAL LOW (ref >60)
GFR calc non Af Amer: 30 mL/min — ABNORMAL LOW (ref >60)
Glucose, Bld: 171 mg/dL — ABNORMAL HIGH (ref 70–99)
Potassium: 3.2 mmol/L — ABNORMAL LOW (ref 3.5–5.1)
Sodium: 140 mmol/L (ref 135–145)

## 2018-06-28 LAB — MAGNESIUM: Magnesium: 1.8 mg/dL (ref 1.7–2.4)

## 2018-06-28 LAB — GLUCOSE, CAPILLARY
Glucose-Capillary: 178 mg/dL — ABNORMAL HIGH (ref 70–99)
Glucose-Capillary: 144 mg/dL — ABNORMAL HIGH (ref 70–99)
Glucose-Capillary: 168 mg/dL — ABNORMAL HIGH (ref 70–99)
Glucose-Capillary: 154 mg/dL — ABNORMAL HIGH (ref 70–99)

## 2018-06-28 SURGERY — IMAGING PROCEDURE, GI TRACT, INTRALUMINAL, VIA CAPSULE

## 2018-06-28 MED ORDER — POTASSIUM CHLORIDE 10 MEQ/100ML IV SOLN
10.0000 meq | INTRAVENOUS | Status: DC
  Administered 2018-06-28: 10 meq via INTRAVENOUS
  Filled 2018-06-28 (×4): qty 100

## 2018-06-28 NOTE — Progress Notes (Signed)
[facility] at [place] NAME: Arthur [name]    MR#:  [id number]  DATE OF BIRTH:  11-10-1933  SUBJECTIVE:   Patient is seen at the bedside. Patient is found laying in bed in NAD. Overall he feels his condition is gradually improving. Voices no new complaints. No new episodes of melena noted.  REVIEW OF SYSTEMS:  ROS Constitutional: Negative for chills, fever, malaise/fatigue and weight loss.  HENT: Negative for congestion, hearing loss and sore throat.   Eyes: Negative for blurred vision and double vision.  Respiratory: Positive for shortness of breath.   Cardiovascular: Positive for orthopnea and leg swelling. Negative for chest pain and palpitations.  Gastrointestinal: Positive for melena. Negative for diarrhea, nausea and vomiting.       Black tarry stools  Genitourinary: Negative for dysuria, hematuria and urgency.  Musculoskeletal: Negative for myalgias.  Skin: Negative for rash.  Neurological: Negative for dizziness, sensory change, speech change, focal weakness and headaches.  Psychiatric/Behavioral: Negative for depression.  DRUG ALLERGIES:   Allergies  Allergen Reactions  . Codeine Other (See Comments)    Constipation   VITALS:  Blood pressure (!) 154/61, pulse 80, temperature (!) 97.5 F (36.4 C), temperature source Oral, resp. rate 20, height 5\' 8"  (1.727 m), weight 118.5 kg, SpO2 98 %. PHYSICAL EXAMINATION:   Physical Exam  GENERAL:  85 y.o.-year-old patient lying in the bed with no acute distress.  EYES: Pupils equal, round, reactive to light and accommodation. No scleral icterus. Extraocular muscles intact.  HEENT: Head atraumatic, normocephalic. Oropharynx and nasopharynx clear.  NECK:  Supple, no jugular venous distention. No thyroid enlargement, no tenderness.  LUNGS: Normal breath sounds bilaterally, no wheezing, rales,rhonchi or crepitation. No use of accessory muscles of respiration.  CARDIOVASCULAR: S1, S2 normal.  No murmurs, rubs, or gallops.  ABDOMEN: Distended, non-tender to palpation. Bowel sounds present. No organomegaly or mass.  EXTREMITIES: Bilateral pitting edema of lower extremities with anterior erythema, tenderness present.  No cyanosis, or clubbing. No rash or lesions. + pedal pulses MUSCULOSKELETAL: Normal bulk, and power was 5+ grip and elbow, knee, and ankle flexion and extension bilaterally.  NEUROLOGIC:Alert and oriented x 3. CN 2-12 intact. Sensation to light touch and cold stimuli intact bilaterally. Finger to nose nl. Babinski is downgoing. DTR's (biceps, patellar, and achilles) 2+ and symmetric throughout. Gait not tested due to safety concern. PSYCHIATRIC: The patient is alert and oriented x 3.  SKIN: As below    DATA REVIEWED:  LABORATORY PANEL:  Male CBC Recent Labs  Lab 06/28/18 0101  WBC 6.4  HGB 7.2*  HCT 23.3*  PLT 299   ------------------------------------------------------------------------------------------------------------------ Chemistries  Recent Labs  Lab 06/27/18 1311 06/28/18 0101  NA 142 140  K 2.8* 3.2*  CL 101 102  CO2 32 31  GLUCOSE 98 171*  BUN 28* 26*  CREATININE 1.96* 1.98*  CALCIUM 8.0* 7.7*  MG  --  1.8  AST 18  --   ALT 14  --   ALKPHOS 44  --   BILITOT 0.5  --    RADIOLOGY:  No results found. ASSESSMENT AND PLAN:   85 y.o. male 85 y.o. male past medical history of BPH (benign prostatic hyperplasia), Chronic kidney disease (CKD), stage III (moderate),COPD, Hyperlipidemia, Hypertension, Morbid obesity (CMS-HCC), and OSA (obstructive sleep apnea) presenting as direct admit from pulmonary clinic with hgb 5.8 and lower extremities cellulitis.  1. GI Bleed - s/p recent EGD and colonoscopy x2  without a source  of bleed identified. - s/p endoscopy capsule study today - pantoprazole 40 mg IV BID - Will hold off further imaging since he has had recent work up including CT abdomen, - Clear liquid diet - GI input appreciated - Hold  NSAIDs, steroids, ASA  2. Acute on Chronic severe anemia -likely secondary to GI bleed as above - Hemoglobin 5.8 s/p 2 units of PRBC with improvement in hgb to 7.2 - H&H monitoring q6h - Transfuse PRN Hgb<7 - Recent work up including iron panel, TIBC, feritin, B12 and folate  3. Bilateral lower extremity cellulitis - Recurrent cellulitis and recent strep B bacteremia - Previous work-up with ultrasound negative for DVT - Recently treated with broad-spectrum antibiotic coverage with cefepime and vancomycin on 6/7 - Recent treatment with Doxy as outpatient - No further abx. Continue wound care  - Pending blood cultures - ID input appreciated  4. Hypokalemia - improved with IV replacement - Recheck in the am  5. DM: His sugars have not been well-controlled on current regimen  - Hold home meds - Continue sliding scale insulin coverage  - ADA 2100 calorie diet  - FS BS qac and qhs   6. Chronic kidney disease -BUN and creatinine improved from baseline - Continue to monitor renal function  7. Advanced COPD= no evidence of exacerbation  - DuoNeb every 6 hours  - Incentive spirometry every hour while awake  - Patient has O2 per nasal cannula at 2 L/min  - Follows with pulmonary as outpatient  8. HLD = + Goal LDL<70 -Pravastatin 10 mg  9. HTN- slightly elevated + Goal BP <140/90 - Continue amlodipine    All the records are reviewed and case discussed with Care Management/Social Worker. Management plans discussed with the patient, family and they are in agreement.  CODE STATUS: DNR  TOTAL TIME TAKING CARE OF THIS PATIENT:38 minutes.   More than 50% of the time was spent in counseling/coordination of care: YES  POSSIBLE D/C IN 1 DAYS, DEPENDING ON CLINICAL CONDITION.  on 06/28/2018 at 5:38 PM  Rufina Falco, DNP, FNP-BC [facility] Nurse Practitioner Between 7am to 6pm - Pager - [phone]  After 6pm go to [email or web address] - password [email or web address] [facility]  Office  [phone]  CC: Primary care physician; Baxter Hire, MD  Note: This dictation was prepared with Dragon dictation along with smaller phrase technology. Any transcriptional errors that result from this process are unintentional.

## 2018-06-28 NOTE — Progress Notes (Signed)
Pt potassium at 3.2 this morning. Notify Dr. Marcille Blanco and states will place order. Will continue to monitor.

## 2018-06-28 NOTE — Consult Note (Signed)
[facility] Nurse wound consult note Reason for Consult:Compression applied last night for chronic edema and venous insufficiency.  Patient tolerating without incident.  Wound type:Chronic edema and skin changes.  Pressure Injury POA: NA Dressing procedure/placement/frequency: Will change Unna boots twice weekly while here. Monday and Thursday.   [facility] team will follow.  Domenic Moras MSN, RN, FNP-BC CWON Wound, Ostomy, Continence Nurse Pager [phone]

## 2018-06-28 NOTE — Progress Notes (Signed)
   Date of Admission:  06/27/2018    ID: Arthur [name] is a 85 y.o. male  Active Problems:   Severe anemia    Subjective: Feeling better  Medications:  . amLODipine  2.5 mg Oral Daily  . insulin aspart  0-5 Units Subcutaneous QHS  . insulin aspart  0-9 Units Subcutaneous TID WC  . nystatin cream   Topical BID  . [START ON 06/30/2018] pantoprazole  40 mg Intravenous Q12H  . pravastatin  10 mg Oral QHS  . tamsulosin  0.4 mg Oral Daily    Objective: Vital signs in last 24 hours: Temp:  [97.9 F (36.6 C)-98.7 F (37.1 C)] 98 F (36.7 C) (06/26 0817) Pulse Rate:  [79-88] 79 (06/26 0817) Resp:  [16-20] 19 (06/26 0817) BP: (146-164)/(51-63) 161/62 (06/26 0817) SpO2:  [96 %-100 %] 100 % (06/26 0817) Weight:  [118.5 kg] 118.5 kg (06/25 1825)  PHYSICAL EXAM:  General: Alert, cooperative, no distress, appears stated age.  Extremities:removed coban- legs less swollen, erythema of the shins b/l improving Neurologic: Grossly non-focal  Lab Results Recent Labs    06/27/18 1311 06/28/18 0101  WBC  --  6.4  HGB  --  7.2*  HCT  --  23.3*  NA 142 140  K 2.8* 3.2*  CL 101 102  CO2 32 31  BUN 28* 26*  CREATININE 1.96* 1.98*   Liver Panel Recent Labs    06/27/18 1311  PROT 6.0*  ALBUMIN 3.0*  AST 18  ALT 14  ALKPHOS 44  BILITOT 0.5   Sedimentation Rate No results for input(s): ESRSEDRATE in the last 72 hours. C-Reactive Protein No results for input(s): CRP in the last 72 hours.  Microbiology:  Studies/Results: No results found.   Assessment/Plan:  Anemia- due to GI bleed- recent colonoscopy and endoscopy was normal -having capsule study ? ?B/l leg swelling and redness due to venous stasis- less likely  cellulitis-   keep the leg elevated on 3 pillows- also  coban compression- legs look better after 24 hrs of compression  In May 2020 he had strep bacteremia which was thought to be from the foot -  DC clindamycin as no fever or leucocytosis and he was on DOXY  for nearly 5 days with no improvement  ID will follow him peripherally this weekend

## 2018-06-28 NOTE — Consult Note (Signed)
Lucilla Lame, MD [facility]  [address], [place], [place] [address] Phone: [phone] Fax : [phone]  Consultation  Referring Provider:     Rufina Falco, DNP, FNP-BC Primary Care Physician:  Baxter Hire, MD Primary Gastroenterologist:  Dr. Vicente Males         Reason for Consultation:    GI bleed  Date of Admission:  06/27/2018 Date of Consultation:  06/28/2018         HPI:   Arthur [name] is a 85 y.o. male who has undergone a colonoscopy on the 11th of this month and an upper endoscopy on the eighth of this month for GI bleed.  The patient was not found to have a cause of the GI bleeding and was set up in July for a capsule endoscopy.  The patient has a history of COPD and chronic kidney disease.  The patient also has hypertension morbid obesity hyperlipidemia hypertension and sleep apnea.  The patient's previous admission had a hemoglobin on presentation of 3.6 with the patient being discharged with a hemoglobin of 7.5.  The patient was then admitted with a hemoglobin of 7.2.  The patient was transfused 2 units of blood and started on pantoprazole IV drip.  The patient was also noted to have bilateral lower extremity cellulitis.  I am now being asked to see the patient for his chronic anemia.  Past Medical History:  Diagnosis Date  . COPD (chronic obstructive pulmonary disease) ([place])   . Diabetes mellitus without complication ([place])   . Hyperlipidemia   . Hypertension     Past Surgical History:  Procedure Laterality Date  . COLONOSCOPY WITH PROPOFOL N/A 06/11/2018   Procedure: COLONOSCOPY WITH PROPOFOL;  Surgeon: Jonathon Bellows, MD;  Location: [facility] ENDOSCOPY;  Service: Gastroenterology;  Laterality: N/A;  . COLONOSCOPY WITH PROPOFOL N/A 06/13/2018   Procedure: COLONOSCOPY WITH PROPOFOL;  Surgeon: Jonathon Bellows, MD;  Location: [facility] ENDOSCOPY;  Service: Gastroenterology;  Laterality: N/A;  . ESOPHAGOGASTRODUODENOSCOPY Left 06/10/2018   Procedure: ESOPHAGOGASTRODUODENOSCOPY (EGD);   Surgeon: Jonathon Bellows, MD;  Location: [facility] ENDOSCOPY;  Service: Gastroenterology;  Laterality: Left;  . GIVENS CAPSULE STUDY N/A 06/28/2018   Procedure: GIVENS CAPSULE STUDY;  Surgeon: Lucilla Lame, MD;  Location: [facility] ENDOSCOPY;  Service: Endoscopy;  Laterality: N/A;    Prior to Admission medications   Medication Sig Start Date End Date Taking? Authorizing Provider  albuterol (VENTOLIN HFA) 108 (90 Base) MCG/ACT inhaler Inhale 2 puffs into the lungs every 6 (six) hours as needed for wheezing or shortness of breath. 05/27/18  Yes Demetrios Loll, MD  amLODipine (NORVASC) 2.5 MG tablet Take 2.5 mg by mouth daily. 04/26/18  Yes [provider]  doxycycline (VIBRAMYCIN) 100 MG capsule Take 100 mg by mouth 2 (two) times daily. 06/22/18 07/02/18 Yes [provider]  furosemide (LASIX) 20 MG tablet Take 20 mg by mouth daily as needed for edema. 04/25/18  Yes [provider]  insulin aspart (NOVOLOG) 100 UNIT/ML injection Inject 20 Units into the skin 3 (three) times daily before meals.   Yes [provider]  ipratropium-albuterol (DUONEB) 0.5-2.5 (3) MG/3ML SOLN Take 3 mLs by nebulization every 6 (six) hours as needed. 05/28/18  Yes Mody, Sital, MD  LEVEMIR 100 UNIT/ML injection Inject 0.12 mLs (12 Units total) into the skin daily. Patient taking differently: Inject 40 Units into the skin at bedtime.  05/27/18  Yes Demetrios Loll, MD  nystatin cream (MYCOSTATIN) Apply topically 2 (two) times daily. 05/28/18  Yes Mody, Sital,  MD  pantoprazole (PROTONIX) 40 MG tablet Take 1 tablet (40 mg total) by mouth 2 (two) times daily. 06/14/18  Yes Lang Snow, NP  pravastatin (PRAVACHOL) 10 MG tablet Take 10 mg by mouth at bedtime. 04/25/18  Yes [provider]  tamsulosin (FLOMAX) 0.4 MG CAPS capsule Take 0.4 mg by mouth daily. 04/25/18  Yes [provider]    No family history on file.   Social History   Tobacco Use  . Smoking status: Former Smoker    Quit date:  11/04/1982    Years since quitting: 35.6  . Smokeless tobacco: Current User  Substance Use Topics  . Alcohol use: Yes    Comment: 1/5 /wk  . Drug use: No    Allergies as of 06/27/2018 - Review Complete 06/27/2018  Allergen Reaction Noted  . Codeine Other (See Comments) 08/06/2013    Review of Systems:    All systems reviewed and negative except where noted in HPI.   Physical Exam:  Vital signs in last 24 hours: Temp:  [97.5 F (36.4 C)-98.7 F (37.1 C)] 97.5 F (36.4 C) (06/26 1522) Pulse Rate:  [79-88] 80 (06/26 1522) Resp:  [16-20] 20 (06/26 1522) BP: (146-161)/(54-63) 154/61 (06/26 1522) SpO2:  [96 %-100 %] 98 % (06/26 1522) Weight:  [118.5 kg] 118.5 kg (06/25 1825) Last BM Date: 06/27/18 General:   Pleasant, cooperative in NAD Head:  Normocephalic and atraumatic. Eyes:   No icterus.   Conjunctiva pink. PERRLA. Ears:  Normal auditory acuity. Neck:  Supple; no masses or thyroidomegaly Lungs: Respirations even and unlabored. Lungs clear to auscultation bilaterally.   No wheezes, crackles, or rhonchi.  Heart:  Regular rate and rhythm;  Without murmur, clicks, rubs or gallops Abdomen:  Soft, nondistended, nontender. Normal bowel sounds. No appreciable masses or hepatomegaly.  No rebound or guarding.  Rectal:  Not performed. Msk:  Symmetrical without gross deformities.    Extremities:  Without edema, cyanosis or clubbing. Neurologic:  Alert and oriented x3;  grossly normal neurologically. Skin:  Intact without significant lesions or rashes. Cervical Nodes:  No significant cervical adenopathy. Psych:  Alert and cooperative. Normal affect.  LAB RESULTS: Recent Labs    06/28/18 0101  WBC 6.4  HGB 7.2*  HCT 23.3*  PLT 299   BMET Recent Labs    06/27/18 1311 06/28/18 0101  NA 142 140  K 2.8* 3.2*  CL 101 102  CO2 32 31  GLUCOSE 98 171*  BUN 28* 26*  CREATININE 1.96* 1.98*  CALCIUM 8.0* 7.7*   LFT Recent Labs    06/27/18 1311  PROT 6.0*  ALBUMIN 3.0*   AST 18  ALT 14  ALKPHOS 44  BILITOT 0.5   PT/INR No results for input(s): LABPROT, INR in the last 72 hours.  STUDIES: No results found.    Impression / Plan:   Assessment: Active Problems:   Severe anemia   Arthur [name] is a 85 y.o. y/o male with recurrent GI bleeding with chronic anemia.  The patient has had an EGD and colonoscopy earlier this month without a source of the GI bleeding seen.  The patient was set up for an outpatient capsule study but got admitted prior to this taking place.  The patient was started on a clear liquid diet today.  Plan:  I discussed the patient that his EGD and colonoscopy were negative and that no source of GI bleeding was seen.  I have set him up for a capsule endoscopy to be  done today and will be downloaded and hopefully read this weekend.  The patient should be transfused as needed.  Dr. Vicente Males will be following this weekend.  Thank you for involving me in the care of this patient.      LOS: 1 day   Lucilla Lame, MD  06/28/2018, 5:27 PM    Note: This dictation was prepared with Dragon dictation along with smaller phrase technology. Any transcriptional errors that result from this process are unintentional.

## 2018-06-28 NOTE — Plan of Care (Signed)
  Problem: Clinical Measurements: Goal: Ability to maintain clinical measurements within normal limits will improve Outcome: Progressing Goal: Diagnostic test results will improve Outcome: Progressing   Problem: Safety: Goal: Ability to remain free from injury will improve Outcome: Progressing   

## 2018-06-28 NOTE — Progress Notes (Addendum)
Pt glasses was at his bedside while sleeping. On my assessment pt states his been looking for his glasses since this morning after they change his bed. Called Gerald Stabs the tech and talked to him and pt states that he don't have it since last night. Checked his room and also the linen but did not see trace of his glasses. Pt did states that he might accidentally place his glasses on his breakfast tray but was sure. Will continue to monitor.  Update: Arthur [name] daughter of the patient called and was requesting if his father can sit on the edge of the bed to eat his breakfast, lunch, and dinner. Pt did report that he can not enjoy his food while in bed at upright position. Daughter was inform that we can sit the patient in the edge of the bed with his bed alarm on. Will continue to monitor.

## 2018-06-28 NOTE — Plan of Care (Signed)
  Problem: Clinical Measurements: Goal: Ability to maintain clinical measurements within normal limits will improve Outcome: Progressing   Problem: Safety: Goal: Ability to remain free from injury will improve Outcome: Progressing   

## 2018-06-29 ENCOUNTER — Encounter: Payer: Self-pay | Admitting: *Deleted

## 2018-06-29 DIAGNOSIS — D649 Anemia, unspecified: Secondary | ICD-10-CM

## 2018-06-29 LAB — CBC WITH DIFFERENTIAL/PLATELET
Abs Immature Granulocytes: 0.03 10*3/uL (ref 0.00–0.07)
Basophils Absolute: 0 10*3/uL (ref 0.0–0.1)
Basophils Relative: 1 %
Eosinophils Absolute: 0.2 10*3/uL (ref 0.0–0.5)
Eosinophils Relative: 4 %
HCT: 22.4 % — ABNORMAL LOW (ref 39.0–52.0)
Hemoglobin: 6.7 g/dL — ABNORMAL LOW (ref 13.0–17.0)
Immature Granulocytes: 1 %
Lymphocytes Relative: 18 %
Lymphs Abs: 1 10*3/uL (ref 0.7–4.0)
MCH: 28.9 pg (ref 26.0–34.0)
MCHC: 29.9 g/dL — ABNORMAL LOW (ref 30.0–36.0)
MCV: 96.6 fL (ref 80.0–100.0)
Monocytes Absolute: 0.5 10*3/uL (ref 0.1–1.0)
Monocytes Relative: 9 %
Neutro Abs: 3.9 10*3/uL (ref 1.7–7.7)
Neutrophils Relative %: 67 %
Platelets: 268 10*3/uL (ref 150–400)
RBC: 2.32 MIL/uL — ABNORMAL LOW (ref 4.22–5.81)
RDW: 20.1 % — ABNORMAL HIGH (ref 11.5–15.5)
WBC: 5.7 10*3/uL (ref 4.0–10.5)
nRBC: 0.4 % — ABNORMAL HIGH (ref 0.0–0.2)

## 2018-06-29 LAB — BASIC METABOLIC PANEL
Anion gap: 4 — ABNORMAL LOW (ref 5–15)
BUN: 28 mg/dL — ABNORMAL HIGH (ref 8–23)
CO2: 35 mmol/L — ABNORMAL HIGH (ref 22–32)
Calcium: 8 mg/dL — ABNORMAL LOW (ref 8.9–10.3)
Chloride: 102 mmol/L (ref 98–111)
Creatinine, Ser: 1.72 mg/dL — ABNORMAL HIGH (ref 0.61–1.24)
GFR calc Af Amer: 42 mL/min — ABNORMAL LOW (ref >60)
GFR calc non Af Amer: 36 mL/min — ABNORMAL LOW (ref >60)
Glucose, Bld: 166 mg/dL — ABNORMAL HIGH (ref 70–99)
Potassium: 4.2 mmol/L (ref 3.5–5.1)
Sodium: 141 mmol/L (ref 135–145)

## 2018-06-29 LAB — HEMOGLOBIN AND HEMATOCRIT, BLOOD
HCT: 26.2 % — ABNORMAL LOW (ref 39.0–52.0)
HCT: 25.4 % — ABNORMAL LOW (ref 39.0–52.0)
Hemoglobin: 7.9 g/dL — ABNORMAL LOW (ref 13.0–17.0)
Hemoglobin: 7.7 g/dL — ABNORMAL LOW (ref 13.0–17.0)

## 2018-06-29 LAB — GLUCOSE, CAPILLARY
Glucose-Capillary: 157 mg/dL — ABNORMAL HIGH (ref 70–99)
Glucose-Capillary: 181 mg/dL — ABNORMAL HIGH (ref 70–99)
Glucose-Capillary: 166 mg/dL — ABNORMAL HIGH (ref 70–99)
Glucose-Capillary: 144 mg/dL — ABNORMAL HIGH (ref 70–99)

## 2018-06-29 LAB — PREPARE RBC (CROSSMATCH)

## 2018-06-29 MED ORDER — METOPROLOL TARTRATE 5 MG/5ML IV SOLN
5.0000 mg | INTRAVENOUS | Status: DC | PRN
  Administered 2018-06-29 – 2018-06-30 (×3): 5 mg via INTRAVENOUS
  Filled 2018-06-29 (×3): qty 5

## 2018-06-29 MED ORDER — PEG 3350-KCL-NA BICARB-NACL 420 G PO SOLR
2000.0000 mL | Freq: Once | ORAL | Status: AC
  Administered 2018-06-29: 2000 mL via ORAL
  Filled 2018-06-29: qty 4000

## 2018-06-29 MED ORDER — SODIUM CHLORIDE 0.9% IV SOLUTION
Freq: Once | INTRAVENOUS | Status: AC
  Administered 2018-06-29: 11:00:00 via INTRAVENOUS

## 2018-06-29 NOTE — Progress Notes (Addendum)
Pt HGB at at 6.7. Notify [facility]. Will continue to monitor.  Update 0616: Talked to Dr. Marcille Blanco and states will notify incoming shift for pt hgb. Will continue to monitor.

## 2018-06-29 NOTE — Progress Notes (Addendum)
Patient's blood pressure elevated at 160/54 this evening, this RN retook BP with a reading of 169/59. Dr. Margaretmary Eddy notified, verbal orders for metoprolol tartrate (lopressor) 5mg  IV every 4 hours PRN with SBP >100 DBP >100. Administered, will recheck BP in half an hour.   Update 1835: Upon reassessment, atient's blood pressure 148/59. Patient drinking GoLYTELY with no issues.

## 2018-06-29 NOTE — Progress Notes (Signed)
[place] [facility] [place] NAME: Arthur [name]    MR#:  [id number]  DATE OF BIRTH:  [birth year]  SUBJECTIVE:   Patient is seen at the bedside.  Globin dropped down to 6.7.  Patient reported black stool.  Denies any nausea vomiting.  Capsule endoscopy done on 06/28/2018-poor visualization EGD, colonoscopy were negative REVIEW OF SYSTEMS:  ROS Constitutional: Negative for chills, fever, malaise/fatigue and weight loss.  HENT: Negative for congestion, hearing loss and sore throat.   Eyes: Negative for blurred vision and double vision.  Respiratory: Positive for shortness of breath.   Cardiovascular: Positive for orthopnea and leg swelling. Negative for chest pain and palpitations.  Gastrointestinal: Positive for melena. Negative for diarrhea, nausea and vomiting.       Black tarry stools  Genitourinary: Negative for dysuria, hematuria and urgency.  Musculoskeletal: Negative for myalgias.  Skin: Negative for rash.  Neurological: Negative for dizziness, sensory change, speech change, focal weakness and headaches.  Psychiatric/Behavioral: Negative for depression.  DRUG ALLERGIES:   Allergies  Allergen Reactions  . Codeine Other (See Comments)    Constipation   VITALS:  Blood pressure (!) 143/75, pulse 81, temperature 97.7 F (36.5 C), temperature source Oral, resp. rate 18, height 5\' 8"  (1.727 m), weight 118.5 kg, SpO2 96 %. PHYSICAL EXAMINATION:   Physical Exam  GENERAL:  85 y.o.-year-old patient lying in the bed with no acute distress.  EYES: Pupils equal, round, reactive to light and accommodation. No scleral icterus. Extraocular muscles intact.  HEENT: Head atraumatic, normocephalic. Oropharynx and nasopharynx clear.  NECK:  Supple, no jugular venous distention. No thyroid enlargement, no tenderness.  LUNGS: Normal breath sounds bilaterally, no wheezing, rales,rhonchi or crepitation. No use of accessory muscles of respiration.   CARDIOVASCULAR: S1, S2 normal. No murmurs, rubs, or gallops.  ABDOMEN: Distended, non-tender to palpation. Bowel sounds present. No organomegaly or mass.  EXTREMITIES: Bilateral pitting edema of lower extremities with anterior erythema, tenderness present.  No cyanosis, or clubbing. No rash or lesions. + pedal pulses MUSCULOSKELETAL: Normal bulk, and power was 5+ grip and elbow, knee, and ankle flexion and extension bilaterally.  NEUROLOGIC:Alert and oriented x 3. CN 2-12 intact. Sensation to light touch and cold stimuli intact bilaterally. Finger to nose nl. Babinski is downgoing. DTR's (biceps, patellar, and achilles) 2+ and symmetric throughout. Gait not tested due to safety concern. PSYCHIATRIC: The patient is alert and oriented x 3.  SKIN: As below    DATA REVIEWED:  LABORATORY PANEL:  Male CBC Recent Labs  Lab 06/29/18 0411  WBC 5.7  HGB 6.7*  HCT 22.4*  PLT 268   ------------------------------------------------------------------------------------------------------------------ Chemistries  Recent Labs  Lab 06/27/18 1311 06/28/18 0101 06/29/18 0411  NA 142 140 141  K 2.8* 3.2* 4.2  CL 101 102 102  CO2 32 31 35*  GLUCOSE 98 171* 166*  BUN 28* 26* 28*  CREATININE 1.96* 1.98* 1.72*  CALCIUM 8.0* 7.7* 8.0*  MG  --  1.8  --   AST 18  --   --   ALT 14  --   --   ALKPHOS 44  --   --   BILITOT 0.5  --   --    RADIOLOGY:  No results found. ASSESSMENT AND PLAN:   85 y.o. male 85 y.o. male past medical history of BPH (benign prostatic hyperplasia), Chronic kidney disease (CKD), stage III (moderate),COPD, Hyperlipidemia, Hypertension, Morbid obesity (CMS-HCC), and OSA (obstructive sleep apnea) presenting as direct admit from  pulmonary clinic with hgb 5.8 and lower extremities cellulitis.  1. GI Bleed - s/p recent EGD and colonoscopy x2  without a source of bleed identified. - s/p endoscopy capsule study -06/28/2018 with poor visualization.  Repeat capsule endoscopy  tomorrow.  GoLYTELY 2 L this evening discussed with GI, if etiology is unclear patient may need enteroscopy on Monday. - pantoprazole 40 mg IV BID -Monitor hemoglobin hematocrit - Will hold off further imaging since he has had recent work up including CT abdomen, -N.p.o. - GI input appreciated - Hold NSAIDs, steroids, ASA -COVID negative  2. Acute on Chronic severe anemia -likely secondary to GI bleed as above - Hemoglobin 5.8 s/p 2 units of PRBC with improvement in hgb to 7.2-6.7, transfuse 1 unit of blood - H&H monitoring frequently - Transfuse PRN Hgb<7 - Recent work up including iron panel, serum iron at 26 which is low, TIBC 176 which is low, normal feritin, B12 and folate  3. Bilateral lower extremity cellulitis - Recurrent cellulitis and recent strep B bacteremia - Previous work-up with ultrasound negative for DVT - Recently treated with broad-spectrum antibiotic coverage with cefepime and vancomycin on 6/7 - Recent treatment with Doxy as outpatient - No further abx. Continue wound care  -Negative blood cultures - ID input appreciated  4. Hypokalemia - improved with IV replacement -Potassium at 4.2  5. DM: His sugars have not been well-controlled on current regimen  - Hold home meds - Continue sliding scale insulin coverage   6. Chronic kidney disease -BUN and creatinine improved from baseline - Continue to monitor renal function .  Creatinine 1.98-1.72  7. Advanced COPD= no evidence of exacerbation  - DuoNeb every 6 hours  - Incentive spirometry every hour while awake  - Patient has O2 per nasal cannula at 2 L/min  - Follows with pulmonary as outpatient  8. HLD =Goal LDL<70. Pravastatin 10 mg  9. HTN- slightly elevated, Goal BP <140/90 - Continue amlodipine    All the records are reviewed and case discussed with Care Management/Social Worker. Management plans discussed with the patient, he is  in agreement.  CODE STATUS: DNR  TOTAL TIME TAKING CARE  OF THIS PATIENT:38 minutes.   More than 50% of the time was spent in counseling/coordination of care: YES  POSSIBLE D/C IN 1 DAYS, DEPENDING ON CLINICAL CONDITION.  on 06/29/2018 at 2:40 PM  Rufina Falco, DNP, FNP-BC [facility] Nurse Practitioner Between 7am to 6pm - Pager [phone]  After 6pm go to [email or web address] - password [email or web address] [facility]  Office  [phone]  CC: Primary care physician; Baxter Hire, MD  Note: This dictation was prepared with Dragon dictation along with smaller phrase technology. Any transcriptional errors that result from this process are unintentional.

## 2018-06-29 NOTE — Plan of Care (Signed)
  Problem: Clinical Measurements: Goal: Diagnostic test results will improve Outcome: Progressing Note: After one unit of blood today hgb went up from 6.7 to 7.9.    Problem: Nutrition: Goal: Adequate nutrition will be maintained Outcome: Progressing

## 2018-06-29 NOTE — Progress Notes (Signed)
Jonathon Bellows , MD [address] [place], [place], [place], [address] [address], [place], [place], [place], [address] Phone: [phone]  Fax: [phone]   Arthur [name] is being followed for GI bleed    Subjective: Dark stools    Objective: Vital signs in last 24 hours: Vitals:   06/28/18 1916 06/29/18 0343 06/29/18 0602 06/29/18 0709  BP: (!) 147/50 (!) 174/47 (!) 156/60 (!) 141/63  Pulse: 73 83 79 79  Resp: 18 18  19   Temp: 97.8 F (36.6 C) 97.7 F (36.5 C)  98.1 F (36.7 C)  TempSrc: Oral Oral  Oral  SpO2: 98% 98% 97% 94%  Weight:      Height:       Weight change:   Intake/Output Summary (Last 24 hours) at 06/29/2018 1047 Last data filed at 06/29/2018 0825 Gross per 24 hour  Intake -  Output 800 ml  Net -800 ml     Exam: Heart:: Regular rate and rhythm, S1S2 present or without murmur or extra heart sounds Lungs: normal Abdomen: soft, nontender, normal bowel sounds   Lab Results: @LABTEST2 @ Micro Results: Recent Results (from the past 240 hour(s))  Culture, blood (routine x 2)     Status: None (Preliminary result)   Collection Time: 06/27/18  1:11 PM   Specimen: BLOOD  Result Value Ref Range Status   Specimen Description BLOOD RIGHT ANTECUBITAL  Final   Special Requests   Final    BOTTLES DRAWN AEROBIC AND ANAEROBIC Blood Culture results may not be optimal due to an excessive volume of blood received in culture bottles   Culture   Final    NO GROWTH 2 DAYS Performed at [facility], [address] [place], [place] [address]    Report Status PENDING  Incomplete  Culture, blood (routine x 2)     Status: None (Preliminary result)   Collection Time: 06/27/18  2:31 PM   Specimen: BLOOD  Result Value Ref Range Status   Specimen Description BLOOD RIGHT ANTECUBITAL  Final   Special Requests   Final    BOTTLES DRAWN AEROBIC AND ANAEROBIC Blood Culture adequate volume   Culture   Final    NO GROWTH 2 DAYS Performed at [facility], [address] [place], [place] [address]    Report Status PENDING  Incomplete  SARS Coronavirus 2 (CEPHEID - Performed in [place] hospital lab), Hosp Order     Status: None   Collection Time: 06/27/18  6:15 PM   Specimen: Nasopharyngeal Swab  Result Value Ref Range Status   SARS Coronavirus 2 NEGATIVE NEGATIVE Final    Comment: (NOTE) If result is NEGATIVE SARS-CoV-2 target nucleic acids are NOT DETECTED. The SARS-CoV-2 RNA is generally detectable in upper and lower  respiratory specimens during the acute phase of infection. The lowest  concentration of SARS-CoV-2 viral copies this assay can detect is 250  copies / mL. A negative result does not preclude SARS-CoV-2 infection  and should not be used as the sole basis for treatment or other  patient management decisions.  A negative result may occur with  improper specimen collection / handling, submission of specimen other  than nasopharyngeal swab, presence of viral mutation(s) within the  areas targeted by this assay, and inadequate number of viral copies  (<250 copies / mL). A negative result must be combined with clinical  observations, patient history, and epidemiological information. If result is POSITIVE SARS-CoV-2 target nucleic acids are DETECTED. The SARS-CoV-2  RNA is generally detectable in upper and lower  respiratory specimens dur ing the acute phase of infection.  Positive  results are indicative of active infection with SARS-CoV-2.  Clinical  correlation with patient history and other diagnostic information is  necessary to determine patient infection status.  Positive results do  not rule out bacterial infection or co-infection with other viruses. If result is PRESUMPTIVE POSTIVE SARS-CoV-2 nucleic acids MAY BE PRESENT.   A presumptive positive result was obtained on the submitted specimen  and confirmed on repeat testing.  While 2019 novel coronavirus  (SARS-CoV-2) nucleic acids may be present  in the submitted sample  additional confirmatory testing may be necessary for epidemiological  and / or clinical management purposes  to differentiate between  SARS-CoV-2 and other Sarbecovirus currently known to infect humans.  If clinically indicated additional testing with an alternate test  methodology [phone]) is advised. The SARS-CoV-2 RNA is generally  detectable in upper and lower respiratory sp ecimens during the acute  phase of infection. The expected result is Negative. Fact Sheet for Patients:  StrictlyIdeas.no Fact Sheet for Healthcare Providers: BankingDealers.co.za This test is not yet approved or cleared by the [place] FDA and has been authorized for detection and/or diagnosis of SARS-CoV-2 by FDA under an Emergency Use Authorization (EUA).  This EUA will remain in effect (meaning this test can be used) for the duration of the COVID-19 declaration under Section 564(b)(1) of the Act, 21 U.S.C. section 360bbb-3(b)(1), unless the authorization is terminated or revoked sooner. Performed at [facility], [address], [place], [place] [address]    Studies/Results: No results found. Medications: I have reviewed the patient's current medications. Scheduled Meds: . sodium chloride   Intravenous Once  . amLODipine  2.5 mg Oral Daily  . insulin aspart  0-5 Units Subcutaneous QHS  . insulin aspart  0-9 Units Subcutaneous TID WC  . nystatin cream   Topical BID  . [START ON 06/30/2018] pantoprazole  40 mg Intravenous Q12H  . pravastatin  10 mg Oral QHS  . tamsulosin  0.4 mg Oral Daily   Continuous Infusions: . pantoprozole (PROTONIX) infusion 8 mg/hr (06/29/18 0837)   PRN Meds:.albuterol, guaiFENesin-dextromethorphan, ipratropium-albuterol   Assessment: Active Problems:   Severe anemia  Arthur [name] 85 y.o. male being evaluated for anemia. EGD+ colonoscopy was negative. I just read his capsule study ,  poor visualization from early on in the study . ? From old bleeding or ongoing fresh bleed.   Plan: 1. Continue CBC 2. Monitor CBC and transfuse 3. Golytely 2 L this evening and then repeat capsule study in the morning and likely will need enteroscopy on Monday .    LOS: 2 days   Jonathon Bellows, MD 06/29/2018, 10:47 AM

## 2018-06-30 ENCOUNTER — Encounter
Admission: AD | Disposition: A | Discharge status: Home / Self Care | Payer: Self-pay | Source: Ambulatory Visit | Attending: Internal Medicine

## 2018-06-30 DIAGNOSIS — D509 Iron deficiency anemia, unspecified: Secondary | ICD-10-CM

## 2018-06-30 HISTORY — PX: GIVENS CAPSULE STUDY: SHX5432

## 2018-06-30 LAB — GLUCOSE, CAPILLARY
Glucose-Capillary: 146 mg/dL — ABNORMAL HIGH (ref 70–99)
Glucose-Capillary: 141 mg/dL — ABNORMAL HIGH (ref 70–99)
Glucose-Capillary: 213 mg/dL — ABNORMAL HIGH (ref 70–99)
Glucose-Capillary: 137 mg/dL — ABNORMAL HIGH (ref 70–99)

## 2018-06-30 LAB — TYPE AND SCREEN
ABO/RH(D): A POS
Antibody Screen: NEGATIVE
Unit division: 0
Unit division: 0
Unit division: 0

## 2018-06-30 LAB — BASIC METABOLIC PANEL
Anion gap: 7 (ref 5–15)
BUN: 30 mg/dL — ABNORMAL HIGH (ref 8–23)
CO2: 31 mmol/L (ref 22–32)
Calcium: 8.2 mg/dL — ABNORMAL LOW (ref 8.9–10.3)
Chloride: 103 mmol/L (ref 98–111)
Creatinine, Ser: 1.74 mg/dL — ABNORMAL HIGH (ref 0.61–1.24)
GFR calc Af Amer: 41 mL/min — ABNORMAL LOW (ref >60)
GFR calc non Af Amer: 35 mL/min — ABNORMAL LOW (ref >60)
Glucose, Bld: 173 mg/dL — ABNORMAL HIGH (ref 70–99)
Potassium: 4.6 mmol/L (ref 3.5–5.1)
Sodium: 141 mmol/L (ref 135–145)

## 2018-06-30 LAB — BPAM RBC
Blood Product Expiration Date: 202007092359
Blood Product Expiration Date: 202007092359
Blood Product Expiration Date: 202007102359
ISSUE DATE / TIME: 202006251633
ISSUE DATE / TIME: 202006252023
ISSUE DATE / TIME: 202006271111
Unit Type and Rh: 6200
Unit Type and Rh: 6200
Unit Type and Rh: 6200

## 2018-06-30 SURGERY — IMAGING PROCEDURE, GI TRACT, INTRALUMINAL, VIA CAPSULE

## 2018-06-30 MED ORDER — SODIUM CHLORIDE 0.9 % IV SOLN
INTRAVENOUS | Status: DC | PRN
  Administered 2018-06-30: 250 mL via INTRAVENOUS

## 2018-06-30 MED ORDER — SODIUM CHLORIDE 0.9 % IV SOLN
125.0000 mg | Freq: Every day | INTRAVENOUS | Status: AC
  Administered 2018-06-30 – 2018-07-01 (×2): 125 mg via INTRAVENOUS
  Filled 2018-06-30 (×2): qty 10

## 2018-06-30 MED ORDER — SODIUM CHLORIDE 0.9 % IV SOLN: 100.0000 mg | INTRAVENOUS | Status: DC

## 2018-06-30 NOTE — Progress Notes (Signed)
Daughter [name] updated over the phone. Appreciated the call, would like a doctor to call her. Dr. Margaretmary Eddy notified of daughters request.

## 2018-06-30 NOTE — Progress Notes (Signed)
[facility] [place] NAME: Arthur [name]    MR#:  [id number]  DATE OF BIRTH:  25-Jan-1933  SUBJECTIVE:   Patient is seen at the bedside.  Denies any black stools today denies any nausea vomiting.  Capsule endoscopy done on 06/28/2018-poor visualization EGD, colonoscopy were negative REVIEW OF SYSTEMS:  ROS Constitutional: Negative for chills, fever, malaise/fatigue and weight loss.  HENT: Negative for congestion, hearing loss and sore throat.   Eyes: Negative for blurred vision and double vision.  Respiratory: Positive for shortness of breath.   Cardiovascular: Positive for orthopnea and leg swelling. Negative for chest pain and palpitations.  Gastrointestinal: Positive for melena. Negative for diarrhea, nausea and vomiting.       Black tarry stools  Genitourinary: Negative for dysuria, hematuria and urgency.  Musculoskeletal: Negative for myalgias.  Skin: Negative for rash.  Neurological: Negative for dizziness, sensory change, speech change, focal weakness and headaches.  Psychiatric/Behavioral: Negative for depression.  DRUG ALLERGIES:   Allergies  Allergen Reactions  . Codeine Other (See Comments)    Constipation   VITALS:  Blood pressure (!) 160/54, pulse 80, temperature 98.6 F (37 C), temperature source Oral, resp. rate 18, height 5\' 8"  (1.727 m), weight 118.5 kg, SpO2 97 %. PHYSICAL EXAMINATION:   Physical Exam  GENERAL:  85 y.o.-year-old patient lying in the bed with no acute distress.  EYES: Pupils equal, round, reactive to light and accommodation. No scleral icterus. Extraocular muscles intact.  HEENT: Head atraumatic, normocephalic. Oropharynx and nasopharynx clear.  NECK:  Supple, no jugular venous distention. No thyroid enlargement, no tenderness.  LUNGS: Normal breath sounds bilaterally, no wheezing, rales,rhonchi or crepitation. No use of accessory muscles of respiration.  CARDIOVASCULAR: S1, S2 normal. No murmurs,  rubs, or gallops.  ABDOMEN: Distended, non-tender to palpation. Bowel sounds present. No organomegaly or mass.  EXTREMITIES: Bilateral pitting edema of lower extremities with anterior erythema, tenderness present.  No cyanosis, or clubbing. No rash or lesions. + pedal pulses MUSCULOSKELETAL: Normal bulk, and power was 5+ grip and elbow, knee, and ankle flexion and extension bilaterally.  NEUROLOGIC:Alert and oriented x 3. CN 2-12 intact. Sensation to light touch and cold stimuli intact bilaterally. Finger to nose nl. Babinski is downgoing. DTR's (biceps, patellar, and achilles) 2+ and symmetric throughout. Gait not tested due to safety concern. PSYCHIATRIC: The patient is alert and oriented x 3.  SKIN: As below    DATA REVIEWED:  LABORATORY PANEL:  Male CBC Recent Labs  Lab 06/29/18 0411  06/29/18 2301  WBC 5.7  --   --   HGB 6.7*   < > 7.7*  HCT 22.4*   < > 25.4*  PLT 268  --   --    < > = values in this interval not displayed.   ------------------------------------------------------------------------------------------------------------------ Chemistries  Recent Labs  Lab 06/27/18 1311 06/28/18 0101  06/30/18 0540  NA 142 140   < > 141  K 2.8* 3.2*   < > 4.6  CL 101 102   < > 103  CO2 32 31   < > 31  GLUCOSE 98 171*   < > 173*  BUN 28* 26*   < > 30*  CREATININE 1.96* 1.98*   < > 1.74*  CALCIUM 8.0* 7.7*   < > 8.2*  MG  --  1.8  --   --   AST 18  --   --   --   ALT 14  --   --   --  ALKPHOS 44  --   --   --   BILITOT 0.5  --   --   --    < > = values in this interval not displayed.   RADIOLOGY:  No results found. ASSESSMENT AND PLAN:   85 y.o. male 85 y.o. male past medical history of BPH (benign prostatic hyperplasia), Chronic kidney disease (CKD), stage III (moderate),COPD, Hyperlipidemia, Hypertension, Morbid obesity (CMS-HCC), and OSA (obstructive sleep apnea) presenting as direct admit from pulmonary clinic with hgb 5.8 and lower extremities cellulitis.   1. GI Bleed - s/p recent EGD and colonoscopy x2  without a source of bleed identified. - s/p endoscopy capsule study -06/28/2018 with poor visualization.  Repeat capsule endoscopy today GoLYTELY 2 L on 627 evening if etiology is unclear patient may need enteroscopy on Monday. - pantoprazole 40 mg IV BID -Monitor hemoglobin hematocrit - Will hold off further imaging since he has had recent work up including CT abdomen, -N.p.o. - GI input appreciated - Hold NSAIDs, steroids, ASA -COVID negative  2. Acute on Chronic severe anemia -likely secondary to GI bleed as above - Hemoglobin 5.8 s/p 2 units of PRBC with improvement in hgb to 7.2-6.7, transfuse 1 unit of blood-hemoglobin 7.7 - H&H monitoring frequently - Transfuse PRN Hgb<7 - Recent work up including iron panel, serum iron at 26 which is low, TIBC 176 which is low, normal feritin, B12 and folate.  Will give IV Venofer  3. Bilateral lower extremity cellulitis - Recurrent cellulitis and recent strep B bacteremia - Previous work-up with ultrasound negative for DVT - Recently treated with broad-spectrum antibiotic coverage with cefepime and vancomycin on 6/7 - Recent treatment with Doxy as outpatient - No further abx. Continue wound care  -Negative blood cultures - ID input appreciated  4. Hypokalemia - improved with IV replacement -Potassium at 4.2-4.6   5. DM: His sugars have not been well-controlled on current regimen  - Hold home meds - Continue sliding scale insulin coverage   6. Chronic kidney disease -BUN and creatinine improved from baseline - Continue to monitor renal function .  Creatinine 1.98-1.72--  7. Advanced COPD= no evidence of exacerbation  - DuoNeb every 6 hours  - Incentive spirometry every hour while awake  - Patient has O2 per nasal cannula at 2 L/min  - Follows with pulmonary as outpatient  8. HLD =Goal LDL<70. Pravastatin 10 mg  9. HTN- slightly elevated, Goal BP <140/90 - Continue amlodipine  titrate medications as needed   All the records are reviewed and case discussed with Care Management/Social Worker. Management plans discussed with the patient, he is  in agreement.  CODE STATUS: DNR  TOTAL TIME TAKING CARE OF THIS PATIENT:33 minutes.   More than 50% of the time was spent in counseling/coordination of care: YES  POSSIBLE D/C IN 1 DAYS, DEPENDING ON CLINICAL CONDITION.  on 06/30/2018 at 2:14 PM  Rufina Falco, DNP, FNP-BC [facility] Nurse Practitioner Between 7am to 6pm - Pager [phone]  After 6pm go to [email or web address] - password [email or web address] [facility]  Office  [phone]  CC: Primary care physician; Baxter Hire, MD  Note: This dictation was prepared with Dragon dictation along with smaller phrase technology. Any transcriptional errors that result from this process are unintentional.

## 2018-06-30 NOTE — Progress Notes (Signed)
Notified Dr. Margaretmary Eddy that we did not have enough stock of Venofer. MD agreed to change order to Ferrlicit 125mg  daily for 2 doses.

## 2018-06-30 NOTE — Plan of Care (Signed)
  Problem: Clinical Measurements: Goal: Ability to maintain clinical measurements within normal limits will improve Outcome: Progressing Goal: Will remain free from infection Outcome: Progressing   Problem: Activity: Goal: Risk for activity intolerance will decrease Outcome: Progressing Note: Patient up to the chair .

## 2018-07-01 ENCOUNTER — Encounter: Payer: Self-pay | Admitting: Gastroenterology

## 2018-07-01 LAB — CBC
HCT: 26 % — ABNORMAL LOW (ref 39.0–52.0)
Hemoglobin: 7.7 g/dL — ABNORMAL LOW (ref 13.0–17.0)
MCH: 28.5 pg (ref 26.0–34.0)
MCHC: 29.6 g/dL — ABNORMAL LOW (ref 30.0–36.0)
MCV: 96.3 fL (ref 80.0–100.0)
Platelets: 244 10*3/uL (ref 150–400)
RBC: 2.7 MIL/uL — ABNORMAL LOW (ref 4.22–5.81)
RDW: 18.5 % — ABNORMAL HIGH (ref 11.5–15.5)
WBC: 6.9 10*3/uL (ref 4.0–10.5)
nRBC: 0.6 % — ABNORMAL HIGH (ref 0.0–0.2)

## 2018-07-01 LAB — RETICULOCYTES
Immature Retic Fract: 13.2 % (ref 2.3–15.9)
RBC.: 2.57 MIL/uL — ABNORMAL LOW (ref 4.22–5.81)
Retic Count, Absolute: 17 10*3/uL — ABNORMAL LOW (ref 19.0–186.0)
Retic Ct Pct: 0.7 % (ref 0.4–3.1)

## 2018-07-01 LAB — BASIC METABOLIC PANEL
Anion gap: 6 (ref 5–15)
BUN: 30 mg/dL — ABNORMAL HIGH (ref 8–23)
CO2: 36 mmol/L — ABNORMAL HIGH (ref 22–32)
Calcium: 8.3 mg/dL — ABNORMAL LOW (ref 8.9–10.3)
Chloride: 101 mmol/L (ref 98–111)
Creatinine, Ser: 1.71 mg/dL — ABNORMAL HIGH (ref 0.61–1.24)
GFR calc Af Amer: 42 mL/min — ABNORMAL LOW (ref >60)
GFR calc non Af Amer: 36 mL/min — ABNORMAL LOW (ref >60)
Glucose, Bld: 157 mg/dL — ABNORMAL HIGH (ref 70–99)
Potassium: 4.4 mmol/L (ref 3.5–5.1)
Sodium: 143 mmol/L (ref 135–145)

## 2018-07-01 LAB — GLUCOSE, CAPILLARY
Glucose-Capillary: 152 mg/dL — ABNORMAL HIGH (ref 70–99)
Glucose-Capillary: 155 mg/dL — ABNORMAL HIGH (ref 70–99)
Glucose-Capillary: 132 mg/dL — ABNORMAL HIGH (ref 70–99)
Glucose-Capillary: 124 mg/dL — ABNORMAL HIGH (ref 70–99)

## 2018-07-01 MED ORDER — AMLODIPINE BESYLATE 5 MG PO TABS
5.0000 mg | ORAL_TABLET | Freq: Every day | ORAL | Status: DC
  Administered 2018-07-02: 5 mg via ORAL
  Filled 2018-07-01: qty 1

## 2018-07-01 MED ORDER — AMLODIPINE BESYLATE 5 MG PO TABS
2.5000 mg | ORAL_TABLET | Freq: Once | ORAL | Status: AC
  Administered 2018-07-01: 2.5 mg via ORAL
  Filled 2018-07-01: qty 1

## 2018-07-01 MED ORDER — SODIUM CHLORIDE 0.9 % IV SOLN
100.0000 mg | Freq: Once | INTRAVENOUS | Status: AC
  Administered 2018-07-01: 100 mg via INTRAVENOUS
  Filled 2018-07-01 (×2): qty 5

## 2018-07-01 NOTE — Progress Notes (Signed)
[facility] at [place] NAME: Arthur [name]    MR#:  [id number]  DATE OF BIRTH:  09/05/1933  SUBJECTIVE:   Patient is seen at the bedside.  No other episodes of bleed ,denies any black stools today denies any nausea vomiting.  Capsule endoscopy done on 06/28/2018-poor visualization, repeat capsule study is normal  EGD, colonoscopy were negative REVIEW OF SYSTEMS:  Review of Systems  Constitutional: Negative for fever.  Gastrointestinal: Negative for blood in stool.   Constitutional: Negative for chills, fever, malaise/fatigue and weight loss.  HENT: Negative for congestion, hearing loss and sore throat.   Eyes: Negative for blurred vision and double vision.  Respiratory: neg sob Cardiovascular: Positive for orthopnea and leg swelling. Negative for chest pain and palpitations.  Gastrointestinal: denies melena  Negative for diarrhea, nausea and vomiting.     Genitourinary: Negative for dysuria, hematuria and urgency.  Musculoskeletal: Negative for myalgias.  Skin: Negative for rash.  Neurological: Negative for dizziness, sensory change, speech change, focal weakness and headaches.  Psychiatric/Behavioral: Negative for depression.  DRUG ALLERGIES:   Allergies  Allergen Reactions  . Codeine Other (See Comments)    Constipation   VITALS:  Blood pressure (!) 156/54, pulse 70, temperature 98.1 F (36.7 C), temperature source Oral, resp. rate 19, height 5\' 8"  (1.727 m), weight 118.5 kg, SpO2 92 %. PHYSICAL EXAMINATION:   Physical Exam  GENERAL:  85 y.o.-year-old patient lying in the bed with no acute distress.  EYES: Pupils equal, round, reactive to light and accommodation. No scleral icterus. Extraocular muscles intact.  HEENT: Head atraumatic, normocephalic. Oropharynx and nasopharynx clear.  NECK:  Supple, no jugular venous distention. No thyroid enlargement, no tenderness.  LUNGS: Normal breath sounds bilaterally, no wheezing,  rales,rhonchi or crepitation. No use of accessory muscles of respiration.  CARDIOVASCULAR: S1, S2 normal. No murmurs, rubs, or gallops.  ABDOMEN: Distended, non-tender to palpation. Bowel sounds present. No organomegaly or mass.  EXTREMITIES: Bilateral pitting edema of lower extremities with chronic anterior erythema, tenderness present.  No cyanosis, or clubbing. No rash or lesions. + pedal pulses MUSCULOSKELETAL: Normal bulk, and power was 5+ grip and elbow, knee, and ankle flexion and extension bilaterally.  NEUROLOGIC:Alert and oriented x 3. CN 2-12 intact. Sensation to light touch and cold stimuli intact bilaterally. Finger to nose nl. Babinski is downgoing. DTR's (biceps, patellar, and achilles) 2+ and symmetric throughout. Gait not tested due to safety concern. PSYCHIATRIC: The patient is alert and oriented x 3.  SKIN: As below    DATA REVIEWED:  LABORATORY PANEL:  Male CBC Recent Labs  Lab 07/01/18 0352  WBC 6.9  HGB 7.7*  HCT 26.0*  PLT 244   ------------------------------------------------------------------------------------------------------------------ Chemistries  Recent Labs  Lab 06/27/18 1311 06/28/18 0101  07/01/18 0352  NA 142 140   < > 143  K 2.8* 3.2*   < > 4.4  CL 101 102   < > 101  CO2 32 31   < > 36*  GLUCOSE 98 171*   < > 157*  BUN 28* 26*   < > 30*  CREATININE 1.96* 1.98*   < > 1.71*  CALCIUM 8.0* 7.7*   < > 8.3*  MG  --  1.8  --   --   AST 18  --   --   --   ALT 14  --   --   --   ALKPHOS 44  --   --   --  BILITOT 0.5  --   --   --    < > = values in this interval not displayed.   RADIOLOGY:  No results found. ASSESSMENT AND PLAN:   85 y.o. male 85 y.o. male past medical history of BPH (benign prostatic hyperplasia), Chronic kidney disease (CKD), stage III (moderate),COPD, Hyperlipidemia, Hypertension, Morbid obesity (CMS-HCC), and OSA (obstructive sleep apnea) presenting as direct admit from pulmonary clinic with hgb 5.8 and lower  extremities cellulitis.  1. GI Bleed - s/p recent EGD and colonoscopy x2  without a source of bleed identified. - s/p endoscopy capsule study -06/28/2018 with poor visualization.  Repeat capsule endoscopy on 06/30/2018 is also normal as per my discussion with the GI Dr.Tahiliani.  Anemia panel ordered which includes iron labs, B12 and folate -Repeat CBC and BMP in a.m. - pantoprazole 40 mg IV BID -Monitor hemoglobin hematocrit - Will hold off further imaging since he has had recent work up including CT abdomen, -N.p.o. - GI input appreciated - Hold NSAIDs, steroids, ASA -COVID negative -If  no further GI bleed and pt hemodynamically stable plan is to discharge patient in a.m. with outpatient GI follow-up on 07/15/18  2. Acute on Chronic severe anemia -likely secondary to GI bleed as above - Hemoglobin 5.8 s/p 2 units of PRBC with improvement in hgb to 7.2-6.7, transfuse 1 unit of blood-hemoglobin 7.7-7.7 - H&H monitoring frequently - Transfuse PRN Hgb<7 - Recent work up 06/19/18 including iron panel, serum iron at 26 which is low, TIBC 176 which is low, normal feritin, B12 and folate.  Will give IV Venofer  3. Bilateral lower extremity erythema with edema secondary to venous stasis less likely cellulitis the patient was treated for recurrent cellulitis and recent strep B bacteremia - Previous work-up with ultrasound negative for DVT - Recently treated with broad-spectrum antibiotic coverage with cefepime and vancomycin on 6/7 - Recent treatment with Doxy given as outpatient - No further abx treatment recommended by infectious disease. Continue care with leg elevation by 3 pillows and applying Coban compression  -Negative blood cultures - ID input appreciated  4. Hypokalemia - improved with IV replacement -Potassium at 4.2-4.6   5. DM: His sugars have not been well-controlled on current regimen  - Hold home meds - Continue sliding scale insulin coverage   6. Chronic kidney disease  -BUN and creatinine improved from baseline - Continue to monitor renal function .  Creatinine 1.98-1.72--  7. Advanced COPD= no evidence of exacerbation  - DuoNeb every 6 hours  - Incentive spirometry every hour while awake  - Patient has O2 per nasal cannula at 2 L/min  - Follows with pulmonary as outpatient  8. HLD =Goal LDL<70. Pravastatin 10 mg  9. HTN- slightly elevated, Goal BP <140/90 - Continue amlodipine titrate medications as needed   All the records are reviewed and case discussed with Care Management/Social Worker. Management plans discussed with the patient, he is  in agreement.  CODE STATUS: DNR  TOTAL TIME TAKING CARE OF THIS PATIENT:33 minutes.   More than 50% of the time was spent in counseling/coordination of care: YES  POSSIBLE D/C IN 1 DAYS, DEPENDING ON CLINICAL CONDITION.  on 07/01/2018 at 5:01 PM  Rufina Falco, DNP, FNP-BC [facility] Nurse Practitioner Between 7am to 6pm - Pager [phone]  After 6pm go to [email or web address] - password [email or web address] [facility]  Office  [phone]  CC: Primary care physician; Baxter Hire, MD  Note: This dictation was prepared with Dragon  dictation along with smaller phrase technology. Any transcriptional errors that result from this process are unintentional.

## 2018-07-01 NOTE — Progress Notes (Signed)
Arthur Antigua, MD [address] [place], [place], [place], [address] [address], [place], [place], [place], [address] Phone: [phone]  Fax: [phone]   Subjective: No episodes of active GI bleeding.  Hemoglobin stable since yesterday   Objective: Exam: Vital signs in last 24 hours: Vitals:   06/30/18 1917 06/30/18 1917 07/01/18 0309 07/01/18 0722  BP: (!) 146/57 (!) 146/57 (!) 156/67 (!) 169/61  Pulse:  65 74 66  Resp: 16 16 18    Temp: (!) 97.5 F (36.4 C) (!) 97.5 F (36.4 C) 97.8 F (36.6 C) 98 F (36.7 C)  TempSrc: Oral Oral Oral Oral  SpO2: 96% 96% 96% 90%  Weight:      Height:       Weight change:   Intake/Output Summary (Last 24 hours) at 07/01/2018 1050 Last data filed at 07/01/2018 0946 Gross per 24 hour  Intake 448.32 ml  Output 400 ml  Net 48.32 ml    General: No acute distress, AAO x3 Abd: Soft, NT/ND, No HSM Skin: Warm, no rashes Neck: Supple, Trachea midline   Lab Results: Lab Results  Component Value Date   WBC 6.9 07/01/2018   HGB 7.7 (L) 07/01/2018   HCT 26.0 (L) 07/01/2018   MCV 96.3 07/01/2018   PLT 244 07/01/2018   Micro Results: Recent Results (from the past 240 hour(s))  Culture, blood (routine x 2)     Status: None (Preliminary result)   Collection Time: 06/27/18  1:11 PM   Specimen: BLOOD  Result Value Ref Range Status   Specimen Description BLOOD RIGHT ANTECUBITAL  Final   Special Requests   Final    BOTTLES DRAWN AEROBIC AND ANAEROBIC Blood Culture results may not be optimal due to an excessive volume of blood received in culture bottles   Culture   Final    NO GROWTH 4 DAYS Performed at [facility], [address] [place], [place] [address]    Report Status PENDING  Incomplete  Culture, blood (routine x 2)     Status: None (Preliminary result)   Collection Time: 06/27/18  2:31 PM   Specimen: BLOOD  Result Value Ref Range Status   Specimen Description BLOOD RIGHT ANTECUBITAL  Final   Special Requests   Final    BOTTLES DRAWN AEROBIC AND ANAEROBIC Blood Culture adequate volume   Culture   Final    NO GROWTH 4 DAYS Performed at [facility], [address] [place], [place] [address]    Report Status PENDING  Incomplete  SARS Coronavirus 2 (CEPHEID - Performed in [facility] hospital lab), Hosp Order     Status: None   Collection Time: 06/27/18  6:15 PM   Specimen: Nasopharyngeal Swab  Result Value Ref Range Status   SARS Coronavirus 2 NEGATIVE NEGATIVE Final    Comment: (NOTE) If result is NEGATIVE SARS-CoV-2 target nucleic acids are NOT DETECTED. The SARS-CoV-2 RNA is generally detectable in upper and lower  respiratory specimens during the acute phase of infection. The lowest  concentration of SARS-CoV-2 viral copies this assay can detect is 250  copies / mL. A negative result does not preclude SARS-CoV-2 infection  and should not be used as the sole basis for treatment or other  patient management decisions.  A negative result may occur with  improper specimen collection / handling, submission of specimen other  than nasopharyngeal swab, presence of viral mutation(s) within the  areas targeted by this assay, and inadequate number of viral copies  (<250  copies / mL). A negative result must be combined with clinical  observations, patient history, and epidemiological information. If result is POSITIVE SARS-CoV-2 target nucleic acids are DETECTED. The SARS-CoV-2 RNA is generally detectable in upper and lower  respiratory specimens dur ing the acute phase of infection.  Positive  results are indicative of active infection with SARS-CoV-2.  Clinical  correlation with patient history and other diagnostic information is  necessary to determine patient infection status.  Positive results do  not rule out bacterial infection or co-infection with other viruses. If result is PRESUMPTIVE POSTIVE SARS-CoV-2 nucleic acids MAY BE PRESENT.   A presumptive  positive result was obtained on the submitted specimen  and confirmed on repeat testing.  While 2019 novel coronavirus  (SARS-CoV-2) nucleic acids may be present in the submitted sample  additional confirmatory testing may be necessary for epidemiological  and / or clinical management purposes  to differentiate between  SARS-CoV-2 and other Sarbecovirus currently known to infect humans.  If clinically indicated additional testing with an alternate test  methodology [phone]) is advised. The SARS-CoV-2 RNA is generally  detectable in upper and lower respiratory sp ecimens during the acute  phase of infection. The expected result is Negative. Fact Sheet for Patients:  StrictlyIdeas.no Fact Sheet for Healthcare Providers: BankingDealers.co.za This test is not yet approved or cleared by the [place] FDA and has been authorized for detection and/or diagnosis of SARS-CoV-2 by FDA under an Emergency Use Authorization (EUA).  This EUA will remain in effect (meaning this test can be used) for the duration of the COVID-19 declaration under Section 564(b)(1) of the Act, 21 U.S.C. section 360bbb-3(b)(1), unless the authorization is terminated or revoked sooner. Performed at [facility], [address] [place], [place] [address]    Studies/Results: No results found. Medications:  Scheduled Meds: . amLODipine  2.5 mg Oral Daily  . insulin aspart  0-5 Units Subcutaneous QHS  . insulin aspart  0-9 Units Subcutaneous TID WC  . nystatin cream   Topical BID  . pantoprazole  40 mg Intravenous Q12H  . pravastatin  10 mg Oral QHS  . tamsulosin  0.4 mg Oral Daily   Continuous Infusions: . sodium chloride Stopped (06/30/18 1730)  . ferric gluconate (FERRLECIT/NULECIT) IV Stopped (06/30/18 1710)   PRN Meds:.sodium chloride, albuterol, guaiFENesin-dextromethorphan, ipratropium-albuterol, metoprolol tartrate   Assessment: Active  Problems:   Severe anemia    Plan:  No source of anemia and GI bleed identified on repeat small bowel capsule. Official report sent for scanning.  Nonspecific red spots seen.  Continue serial CBCs and transfuse PRN  Avoid NSAIDs  Obtain complete anemia workup with iron panel, B12 and folate levels and replace as appropriate.  If pt continues to have worsening anemia or active GI bleeding, pt may need CTA or RBC scan.   If no further active bleeding and anemia continues to improve, close GI follow up as an outpatient within 1 week of discharge.    LOS: 4 days   Arthur Antigua, MD 07/01/2018, 10:50 AM

## 2018-07-01 NOTE — Care Management Important Message (Signed)
Important Message  Patient Details  Name: Arthur [name] MRN: [id number] Date of Birth: 1933-06-29   Medicare Important Message Given:  Yes     Dannette Barbara 07/01/2018, 2:06 PM

## 2018-07-01 NOTE — Consult Note (Signed)
[facility] Nurse wound follow up Wound type:none Patient uses dry boot (kerlix and coban) for edema management.  HHRN and patient's daughter change 2x wk.  New dry boots applied bilaterally  Discussed POC with patient and bedside nurse.  Re consult if needed, will not follow at this time. Thanks  Ahja Martello [employer], RN,CWOCN, CNS, [place] [phone])

## 2018-07-02 ENCOUNTER — Inpatient Hospital Stay
Admission: AD | Admit: 2018-07-02 | Discharge: 2018-07-02 | Disposition: A | Discharge status: Home / Self Care | Payer: Medicare HMO | Source: Ambulatory Visit | Attending: Internal Medicine | Admitting: Internal Medicine

## 2018-07-02 ENCOUNTER — Inpatient Hospital Stay: Payer: Medicare HMO

## 2018-07-02 LAB — CBC
HCT: 27.1 % — ABNORMAL LOW (ref 39.0–52.0)
Hemoglobin: 7.9 g/dL — ABNORMAL LOW (ref 13.0–17.0)
MCH: 28.9 pg (ref 26.0–34.0)
MCHC: 29.2 g/dL — ABNORMAL LOW (ref 30.0–36.0)
MCV: 99.3 fL (ref 80.0–100.0)
Platelets: 224 10*3/uL (ref 150–400)
RBC: 2.73 MIL/uL — ABNORMAL LOW (ref 4.22–5.81)
RDW: 18 % — ABNORMAL HIGH (ref 11.5–15.5)
WBC: 6.1 10*3/uL (ref 4.0–10.5)
nRBC: 0.5 % — ABNORMAL HIGH (ref 0.0–0.2)

## 2018-07-02 LAB — CULTURE, BLOOD (ROUTINE X 2)
Culture: NO GROWTH
Culture: NO GROWTH
Special Requests: ADEQUATE

## 2018-07-02 LAB — GLUCOSE, CAPILLARY
Glucose-Capillary: 130 mg/dL — ABNORMAL HIGH (ref 70–99)
Glucose-Capillary: 168 mg/dL — ABNORMAL HIGH (ref 70–99)
Glucose-Capillary: 114 mg/dL — ABNORMAL HIGH (ref 70–99)
Glucose-Capillary: 141 mg/dL — ABNORMAL HIGH (ref 70–99)

## 2018-07-02 MED ORDER — FUROSEMIDE 10 MG/ML IJ SOLN
40.0000 mg | Freq: Once | INTRAMUSCULAR | Status: AC
  Administered 2018-07-02: 40 mg via INTRAVENOUS
  Filled 2018-07-02: qty 4

## 2018-07-02 MED ORDER — FUROSEMIDE 10 MG/ML IJ SOLN
20.0000 mg | Freq: Two times a day (BID) | INTRAMUSCULAR | Status: DC
  Administered 2018-07-02 – 2018-07-03 (×3): 20 mg via INTRAVENOUS
  Filled 2018-07-02 (×3): qty 2

## 2018-07-02 MED ORDER — METOPROLOL TARTRATE 25 MG PO TABS
25.0000 mg | ORAL_TABLET | Freq: Two times a day (BID) | ORAL | Status: DC
  Administered 2018-07-02 – 2018-07-03 (×3): 25 mg via ORAL
  Filled 2018-07-02 (×3): qty 1

## 2018-07-02 NOTE — Progress Notes (Signed)
[place] [facility] [place] NAME: Arthur [name]    MR#:  [id number]  DATE OF BIRTH:  1933/12/25  SUBJECTIVE:   Patient is seen at the bedside.  Lives on 2 L of oxygen chronically but requiring 4 L of oxygen with minimal ambulation today with physical therapy.  Chest x-ray has revealed pleural effusions .  Capsule endoscopy done on 06/28/2018-poor visualization, repeat capsule study is normal  EGD, colonoscopy were negative REVIEW OF SYSTEMS:  Review of Systems  Constitutional: Negative for fever.  Gastrointestinal: Negative for blood in stool.   Constitutional: Negative for chills, fever, malaise/fatigue and weight loss.  HENT: Negative for congestion, hearing loss and sore throat.   Eyes: Negative for blurred vision and double vision.  Respiratory: neg sob Cardiovascular: Positive for orthopnea and leg swelling. Negative for chest pain and palpitations.  Gastrointestinal: denies melena  Negative for diarrhea, nausea and vomiting.     Genitourinary: Negative for dysuria, hematuria and urgency.  Musculoskeletal: Negative for myalgias.  Skin: Negative for rash.  Neurological: Negative for dizziness, sensory change, speech change, focal weakness and headaches.  Psychiatric/Behavioral: Negative for depression.  DRUG ALLERGIES:   Allergies  Allergen Reactions  . Codeine Other (See Comments)    Constipation   VITALS:  Blood pressure (!) 161/61, pulse 85, temperature 97.8 F (36.6 C), temperature source Oral, resp. rate 19, height 5\' 8"  (1.727 m), weight 118.5 kg, SpO2 92 %. PHYSICAL EXAMINATION:   Physical Exam  GENERAL:  85 y.o.-year-old patient lying in the bed with no acute distress.  EYES: Pupils equal, round, reactive to light and accommodation. No scleral icterus. Extraocular muscles intact.  HEENT: Head atraumatic, normocephalic. Oropharynx and nasopharynx clear.  NECK:  Supple, no jugular venous distention. No thyroid enlargement, no  tenderness.  LUNGS: Diminished breath sounds bilaterally, no wheezing, bilateral rhonchi, no rales or crepitation. No use of accessory muscles of respiration.  CARDIOVASCULAR: S1, S2 normal. No murmurs, rubs, or gallops.  ABDOMEN: Distended, non-tender to palpation. Bowel sounds present. No organomegaly or mass.  EXTREMITIES: Bilateral pitting edema of lower extremities with chronic anterior erythema, tenderness present.  No cyanosis, or clubbing. No rash or lesions. + pedal pulses MUSCULOSKELETAL: Normal bulk, and power was 5+ grip and elbow, knee, and ankle flexion and extension bilaterally.  NEUROLOGIC:Alert and oriented x 3. CN 2-12 intact. Sensation to light touch and cold stimuli intact bilaterally. Finger to nose nl. Babinski is downgoing. DTR's (biceps, patellar, and achilles) 2+ and symmetric throughout. Gait not tested due to safety concern. PSYCHIATRIC: The patient is alert and oriented x 3.  SKIN: As below    DATA REVIEWED:  LABORATORY PANEL:  Male CBC Recent Labs  Lab 07/02/18 0418  WBC 6.1  HGB 7.9*  HCT 27.1*  PLT 224   ------------------------------------------------------------------------------------------------------------------ Chemistries  Recent Labs  Lab 06/27/18 1311 06/28/18 0101  07/01/18 0352  NA 142 140   < > 143  K 2.8* 3.2*   < > 4.4  CL 101 102   < > 101  CO2 32 31   < > 36*  GLUCOSE 98 171*   < > 157*  BUN 28* 26*   < > 30*  CREATININE 1.96* 1.98*   < > 1.71*  CALCIUM 8.0* 7.7*   < > 8.3*  MG  --  1.8  --   --   AST 18  --   --   --   ALT 14  --   --   --  ALKPHOS 44  --   --   --   BILITOT 0.5  --   --   --    < > = values in this interval not displayed.   RADIOLOGY:  Dg Chest Port 1 View  Result Date: 07/02/2018 CLINICAL DATA:  Cough EXAM: PORTABLE CHEST 1 VIEW COMPARISON:  06/09/2018 FINDINGS: There are new small to moderate bilateral layering pleural effusions with similar mild, diffuse interstitial pulmonary opacity. There is no  new focal airspace opacity. Cardiomegaly. IMPRESSION: There are new small to moderate bilateral layering pleural effusions with similar mild, diffuse interstitial pulmonary opacity. There is no new focal airspace opacity. Cardiomegaly. Electronically Signed   By: Eddie Candle M.D.   On: 07/02/2018 12:28   ASSESSMENT AND PLAN:   85 y.o. male 85 y.o. male past medical history of BPH (benign prostatic hyperplasia), Chronic kidney disease (CKD), stage III (moderate),COPD, Hyperlipidemia, Hypertension, Morbid obesity (CMS-HCC), and OSA (obstructive sleep apnea) presenting as direct admit from pulmonary clinic with hgb 5.8 and lower extremities cellulitis.  1.  Acute on chronic hypoxic respiratory distress secondary to pleural effusions bilaterally We will get echocardiogram to see patient's ejection fraction  Lasix IV, Daily weight monitoring, intake and output .discussed with cardiology curbside, will start the patient on beta-blocker and discontinue amlodipine in view of lower extremity edema.  Outpatient follow-up with [facility] cardiology in a week after discharge  2. GI Bleed - s/p recent EGD and colonoscopy x2  without a source of bleed identified. - s/p endoscopy capsule study -06/28/2018 with poor visualization.  Repeat capsule endoscopy on 06/30/2018 is also normal as per my discussion with the GI Dr.Tahiliani.  Anemia panel ordered which includes iron labs, B12 and folate -Repeat CBC and BMP in a.m. - pantoprazole 40 mg IV BID -Monitor hemoglobin hematocrit - Will hold off further imaging since he has had recent work up including CT abdomen, -N.p.o. - GI input appreciated - Hold NSAIDs, steroids, ASA -COVID negative -If  no further GI bleed and pt hemodynamically stable plan is to discharge patient in a.m. with outpatient GI follow-up on 07/15/18  3. Bilateral lower extremity erythema with edema secondary to venous stasis less likely cellulitis the patient was treated for recurrent cellulitis  and recent strep B bacteremia - Previous work-up with ultrasound negative for DVT - Recently treated with broad-spectrum antibiotic coverage with cefepime and vancomycin on 6/7 - Recent treatment with Doxy given as outpatient - No further abx treatment recommended by infectious disease. Continue care with leg elevation by 3 pillows and applying Coban compression  -Negative blood cultures - ID input appreciated  4. Acute on Chronic severe anemia -likely secondary to GI bleed as above - Hemoglobin 5.8 s/p 2 units of PRBC with improvement in hgb to 7.2-6.7, transfuse 1 unit of blood-hemoglobin 7.7-7.7 from 7.9 - H&H monitoring frequently - Transfuse PRN Hgb<7 - Recent work up 06/19/18 including iron panel, serum iron at 26 which is low, TIBC 176 which is low, normal feritin, B12 and folate.  Given IV Venofer   5. DM: His sugars have not been well-controlled on current regimen  - Hold home meds - Continue sliding scale insulin coverage   6. Chronic kidney disease -BUN and creatinine improved from baseline - Continue to monitor renal function .  Creatinine 1.98-1.72--  7. Advanced COPD= no evidence of exacerbation  - DuoNeb every 6 hours  - Incentive spirometry every hour while awake  - Patient has O2 per nasal cannula at 2 L/min  -  Follows with pulmonary as outpatient  8. HLD =Goal LDL<70. Pravastatin 10 mg  9. HTN- slightly elevated, Goal BP <140/90 - Continue amlodipine titrate medications as needed   All the records are reviewed and case discussed with Care Management/Social Worker. Management plans discussed with the patient, he is  in agreement.  Plan of care discussed with the patient's daughter [name]  CODE STATUS: DNR  TOTAL TIME TAKING CARE OF THIS PATIENT:38 minutes.   More than 50% of the time was spent in counseling/coordination of care: YES  POSSIBLE D/C IN 1 DAYS, DEPENDING ON CLINICAL CONDITION.  on 07/02/2018 at 3:08 PM  Rufina Falco, DNP, FNP-BC [facility] Nurse Practitioner Between 7am to 6pm - Pager [phone]  After 6pm go to [email or web address] - password [email or web address] [facility]  Office  [phone]  CC: Primary care physician; Baxter Hire, MD  Note: This dictation was prepared with Dragon dictation along with smaller phrase technology. Any transcriptional errors that result from this process are unintentional.

## 2018-07-02 NOTE — TOC Transition Note (Signed)
Transition of Care [facility]) - CM/SW Discharge Note   Patient Details  Name: Arthur [name] MRN: [id number] Date of Birth: 07-Jun-1933  Transition of Care [facility]) CM/SW Contact:  Elza Rafter, RN Phone Number: 07/02/2018, 10:04 AM   Clinical Narrative:   Patient has a discharge order placed.  Notified Corene Cornea with [place].  No further needs identified at this time.  Family member to transport home.      Final next level of care: [place] Barriers to Discharge: No Barriers Identified   Patient Goals and CMS Choice Patient states their goals for this hospitalization and ongoing recovery are:: to go home and resume home health services with [facility] CMS Medicare.gov Compare Post Acute Care list provided to:: Patient Choice offered to / list presented to : Patient  Discharge Placement                       Discharge Plan and Services In-house Referral: [facility] Discharge Planning Services: CM Consult                      HH Arranged: RN, PT, OT, Nurse's Aide, Social Work [employer] Agency: [place] ([employer]) Date [place]: 07/02/18 Time [place]: 1004 Representative spoke with at [place]: [place] ([place]) Interventions     Readmission Risk Interventions Readmission Risk Prevention Plan 07/02/2018 05/26/2018  Transportation Screening Complete Complete  PCP or Specialist Appt within 5-7 Days Complete -  Home Care Screening Complete Complete  Medication Review (RN CM) Complete Complete  Some recent data might be hidden

## 2018-07-02 NOTE — Evaluation (Signed)
Physical Therapy Evaluation Patient Details Name: Arthur [name] MRN: [id number] DOB: [birth year] Today's Date: 07/02/2018   History of Present Illness  Pt is an 85 year old male admitted with a GI bleed.  Pt recently admitted with low hemoglobin, cellulitis and severe anemia.  PMH includes COPD, HLD, DM, Htn, BPH and CKD III.  Clinical Impression  Pt is an 85 year old male who lives in a one story home alone.  Pt is able to perform household ambulation with RW and receives assistance with meals from his children who live nearby.  Pt able to stand from chair and ambulate with supervision and use of RW.  Pt presented with gait deviations indicative of fall risk and reported only mild fatigue with ambulation, though he did present with O2 desat to the upper 70% range.  Pt was required to increase O2 to 4L from 2L and recovered to lower 90% range.  Physician notified.  Pt presented with fair balance when performing daily functional activity and does require use of RW for support at this time.  Pt will continue to benefit from skilled PT with focus on strength, tolerance to activity and balance    Follow Up Recommendations Home health PT;Supervision - Intermittent(will continue to assess and upate as appropriate)    Equipment Recommendations  None recommended by PT    Recommendations for Other Services       Precautions / Restrictions        Mobility  Bed Mobility Overal bed mobility: (Pt in chair upon arrival and departure.)                Transfers   Equipment used: Rolling walker (2 wheeled) Transfers: Sit to/from Stand Sit to Stand: Modified independent (Device/Increase time)         General transfer comment: Increased time.  Performed 4x and pt did become fatigued with repetition but was able to stand with bilateral use of UE.  Ambulation/Gait Ambulation/Gait assistance: Min guard Gait Distance (Feet): 60 Feet Assistive device: Rolling walker (2 wheeled)     Gait  velocity interpretation: 1.31 - 2.62 ft/sec, indicative of limited community ambulator General Gait Details: Decreased step length and increase in shuffling gait around corners.  Pt able to manage RW around obstacles safely and reported no sx of SOB following ambulation.  pt desat to 77% and was required to increase O2 to 4L, after which he recovered to 92%.  Stairs            Wheelchair Mobility    Modified Rankin (Stroke Patients Only)       Balance Overall balance assessment: Needs assistance Sitting-balance support: Feet supported Sitting balance-Leahy Scale: Good     Standing balance support: Bilateral upper extremity supported Standing balance-Leahy Scale: Fair                               Pertinent Vitals/Pain Pain Assessment: No/denies pain    Home Living Family/patient expects to be discharged to:: Private residence Living Arrangements: Alone Available Help at Discharge: Family(Son lives across the street and checks in; daughter provides meals.) Type of Home: House Home Access: Stairs to enter Entrance Stairs-Rails: Right;Left;Can reach both Technical brewer of Steps: 8 Home Layout: One level Home Equipment: Cane - single point;Walker - 2 wheels;Walker - 4 wheels      Prior Function Level of Independence: Independent with assistive device(s)         Comments:  Limited household ambulation with RW; daughter provides daily check in and assist with groceries, errands.  Stated that he hasn't fallen in "a really long time".     Hand Dominance        Extremity/Trunk Assessment   Upper Extremity Assessment Upper Extremity Assessment: Generalized weakness    Lower Extremity Assessment Lower Extremity Assessment: Overall WFL for tasks assessed    Cervical / Trunk Assessment Cervical / Trunk Assessment: Normal  Communication   Communication: No difficulties  Cognition Arousal/Alertness: Awake/alert Behavior During Therapy: WFL  for tasks assessed/performed Overall Cognitive Status: Within Functional Limits for tasks assessed                                 General Comments: Pt appeared to become confused at times during conversation, sometimes contradicting himself.      General Comments      Exercises Other Exercises Other Exercises: Time to monitor O2 sat, recovery and adjust O2 x9 min. Other Exercises: Pt able to perform a standing march to simulate stair clearance. Other Exercises: Pt able to bend and pick up shoe from floor with use of RW and SBA.   Assessment/Plan    PT Assessment Patient needs continued PT services  PT Problem List Decreased activity tolerance;Decreased balance;Decreased strength;Decreased range of motion;Decreased mobility;Decreased safety awareness;Cardiopulmonary status limiting activity       PT Treatment Interventions Gait training;Stair training;Functional mobility training;Therapeutic activities;Therapeutic exercise;Balance training;Cognitive remediation;Patient/family education;DME instruction    PT Goals (Current goals can be found in the Care Plan section)  Acute Rehab PT Goals Patient Stated Goal: to go back home PT Goal Formulation: With patient Time For Goal Achievement: 07/16/18 Potential to Achieve Goals: Good    Frequency Min 2X/week   Barriers to discharge Decreased caregiver support      Co-evaluation               AM-PAC PT "6 Clicks" Mobility  Outcome Measure Help needed turning from your back to your side while in a flat bed without using bedrails?: None Help needed moving from lying on your back to sitting on the side of a flat bed without using bedrails?: A Little Help needed moving to and from a bed to a chair (including a wheelchair)?: A Little Help needed standing up from a chair using your arms (e.g., wheelchair or bedside chair)?: A Little Help needed to walk in hospital room?: A Little Help needed climbing 3-5 steps with  a railing? : A Little 6 Click Score: 19    End of Session Equipment Utilized During Treatment: Gait belt;Oxygen Activity Tolerance: Patient tolerated treatment well Patient left: in chair;with call bell/phone within reach;with chair alarm set Nurse Communication: Mobility status PT Visit Diagnosis: Difficulty in walking, not elsewhere classified (R26.2);Muscle weakness (generalized) (M62.81)    Time: 1000-1016 PT Time Calculation (min) (ACUTE ONLY): 16 min   Charges:   PT Evaluation $PT Eval Low Complexity: 1 Low PT Treatments $Therapeutic Activity: 8-22 mins       Roxanne Gates, PT, DPT   Roxanne Gates 07/02/2018, 10:37 AM

## 2018-07-02 NOTE — Progress Notes (Signed)
Vonda Antigua, MD [address] [place], [place], [place], [address] [address], [place], [place], [place], [address] Phone: [phone]  Fax: [phone]   Subjective: No further episodes of bleeding.  No complaints.   Objective: Exam: Vital signs in last 24 hours: Vitals:   07/01/18 2000 07/01/18 2048 07/02/18 0309 07/02/18 0722  BP:  (!) 164/61 (!) 163/66 (!) 161/61  Pulse:  76 83 85  Resp:  20 16 19   Temp:  98.4 F (36.9 C) (!) 97.5 F (36.4 C) 97.8 F (36.6 C)  TempSrc:  Oral Oral Oral  SpO2: 95% 95% 95% 92%  Weight:      Height:       Weight change:   Intake/Output Summary (Last 24 hours) at 07/02/2018 1308 Last data filed at 07/02/2018 0955 Gross per 24 hour  Intake -  Output 525 ml  Net -525 ml    General: No acute distress, AAO x3 Abd: Soft, NT/ND, No HSM Skin: Warm, no rashes Neck: Supple, Trachea midline   Lab Results: Lab Results  Component Value Date   WBC 6.1 07/02/2018   HGB 7.9 (L) 07/02/2018   HCT 27.1 (L) 07/02/2018   MCV 99.3 07/02/2018   PLT 224 07/02/2018   Micro Results: Recent Results (from the past 240 hour(s))  Culture, blood (routine x 2)     Status: None   Collection Time: 06/27/18  1:11 PM   Specimen: BLOOD  Result Value Ref Range Status   Specimen Description BLOOD RIGHT ANTECUBITAL  Final   Special Requests   Final    BOTTLES DRAWN AEROBIC AND ANAEROBIC Blood Culture results may not be optimal due to an excessive volume of blood received in culture bottles   Culture   Final    NO GROWTH 5 DAYS Performed at [facility], [address] [place], [place] [address]    Report Status 07/02/2018 FINAL  Final  Culture, blood (routine x 2)     Status: None   Collection Time: 06/27/18  2:31 PM   Specimen: BLOOD  Result Value Ref Range Status   Specimen Description BLOOD RIGHT ANTECUBITAL  Final   Special Requests   Final    BOTTLES DRAWN AEROBIC AND ANAEROBIC Blood Culture adequate volume   Culture    Final    NO GROWTH 5 DAYS Performed at [facility], [address] [place], [place] [address]    Report Status 07/02/2018 FINAL  Final  SARS Coronavirus 2 (CEPHEID - Performed in [facility] hospital lab), Hosp Order     Status: None   Collection Time: 06/27/18  6:15 PM   Specimen: Nasopharyngeal Swab  Result Value Ref Range Status   SARS Coronavirus 2 NEGATIVE NEGATIVE Final    Comment: (NOTE) If result is NEGATIVE SARS-CoV-2 target nucleic acids are NOT DETECTED. The SARS-CoV-2 RNA is generally detectable in upper and lower  respiratory specimens during the acute phase of infection. The lowest  concentration of SARS-CoV-2 viral copies this assay can detect is 250  copies / mL. A negative result does not preclude SARS-CoV-2 infection  and should not be used as the sole basis for treatment or other  patient management decisions.  A negative result may occur with  improper specimen collection / handling, submission of specimen other  than nasopharyngeal swab, presence of viral mutation(s) within the  areas targeted by this assay, and inadequate number of viral copies  (<250 copies / mL). A negative result must be combined with  clinical  observations, patient history, and epidemiological information. If result is POSITIVE SARS-CoV-2 target nucleic acids are DETECTED. The SARS-CoV-2 RNA is generally detectable in upper and lower  respiratory specimens dur ing the acute phase of infection.  Positive  results are indicative of active infection with SARS-CoV-2.  Clinical  correlation with patient history and other diagnostic information is  necessary to determine patient infection status.  Positive results do  not rule out bacterial infection or co-infection with other viruses. If result is PRESUMPTIVE POSTIVE SARS-CoV-2 nucleic acids MAY BE PRESENT.   A presumptive positive result was obtained on the submitted specimen  and confirmed on repeat testing.  While 2019 novel  coronavirus  (SARS-CoV-2) nucleic acids may be present in the submitted sample  additional confirmatory testing may be necessary for epidemiological  and / or clinical management purposes  to differentiate between  SARS-CoV-2 and other Sarbecovirus currently known to infect humans.  If clinically indicated additional testing with an alternate test  methodology [phone]) is advised. The SARS-CoV-2 RNA is generally  detectable in upper and lower respiratory sp ecimens during the acute  phase of infection. The expected result is Negative. Fact Sheet for Patients:  StrictlyIdeas.no Fact Sheet for Healthcare Providers: BankingDealers.co.za This test is not yet approved or cleared by the [place] FDA and has been authorized for detection and/or diagnosis of SARS-CoV-2 by FDA under an Emergency Use Authorization (EUA).  This EUA will remain in effect (meaning this test can be used) for the duration of the COVID-19 declaration under Section 564(b)(1) of the Act, 21 U.S.C. section 360bbb-3(b)(1), unless the authorization is terminated or revoked sooner. Performed at [facility], [address] [place], [place] [address]    Studies/Results: Dg Chest Port 1 View  Result Date: 07/02/2018 CLINICAL DATA:  Cough EXAM: PORTABLE CHEST 1 VIEW COMPARISON:  06/09/2018 FINDINGS: There are new small to moderate bilateral layering pleural effusions with similar mild, diffuse interstitial pulmonary opacity. There is no new focal airspace opacity. Cardiomegaly. IMPRESSION: There are new small to moderate bilateral layering pleural effusions with similar mild, diffuse interstitial pulmonary opacity. There is no new focal airspace opacity. Cardiomegaly. Electronically Signed   By: Eddie Candle M.D.   On: 07/02/2018 12:28   Medications:  Scheduled Meds: . amLODipine  5 mg Oral Daily  . furosemide  20 mg Intravenous Q12H  . furosemide  40 mg  Intravenous Once  . insulin aspart  0-5 Units Subcutaneous QHS  . insulin aspart  0-9 Units Subcutaneous TID WC  . nystatin cream   Topical BID  . pantoprazole  40 mg Intravenous Q12H  . pravastatin  10 mg Oral QHS  . tamsulosin  0.4 mg Oral Daily   Continuous Infusions: . sodium chloride Stopped (06/30/18 1730)   PRN Meds:.sodium chloride, albuterol, guaiFENesin-dextromethorphan, ipratropium-albuterol, metoprolol tartrate   Assessment: Active Problems:   Severe anemia    Plan: Hemoglobin has stayed stable over the last 3 to 4 days with no further episodes of bleeding Patient to follow-up with Dr. Vicente Males within 1 to 2 weeks of discharge for repeat hemoglobin check  If he develops, shortness of breath, weakness, any episodes of GI bleed, or any other reason for concern, patient to notify PCP, our clinic or return to the ER.  He verbalized understanding.     LOS: 5 days   Vonda Antigua, MD 07/02/2018, 1:08 PM

## 2018-07-02 NOTE — TOC Progression Note (Signed)
Transition of Care [facility]) - Progression Note    Patient Details  Name: Arthur [name] MRN: [id number] Date of Birth: 19-Dec-1933  Transition of Care [facility]) CM/SW Contact  Elza Rafter, RN Phone Number: 07/02/2018, 8:34 AM  Clinical Narrative:   Patient is from home alone.  Son and daughter live close by.  He states he is independent in all ADL's.  Current with PCP; obtains medications at [facility] on [address]. without difficulty.  Open to [facility] for RN, PT, SW, aide and OT.  Will notify [facility] when patient discharges.  PT evaluation pending.  Has chronic oxygen at 2L at home through [employer].  [facility] referral made as he is in the [facility] registry.  He uses a walker at home and his daughter transports him to appointments.  He drives very rarely.        Expected Discharge Plan: [place] Barriers to Discharge: Continued Medical Work up  Expected Discharge Plan and Services Expected Discharge Plan: [place] In-house Referral: [facility] Discharge Planning Services: CM Consult   Living arrangements for the past 2 months: Single Family Home                           HH Arranged: RN, PT, OT, Nurse's Aide, Social Work [employer] Agency: [place] ([facility])         Social Determinants of Health (SDOH) Interventions    Readmission Risk Interventions Readmission Risk Prevention Plan 07/02/2018 05/26/2018  Transportation Screening Complete Complete  PCP or Specialist Appt within 5-7 Days Complete -  Home Care Screening Complete Complete  Medication Review (RN CM) Complete Complete  Some recent data might be hidden

## 2018-07-03 LAB — CBC
HCT: 26.1 % — ABNORMAL LOW (ref 39.0–52.0)
Hemoglobin: 7.7 g/dL — ABNORMAL LOW (ref 13.0–17.0)
MCH: 28.7 pg (ref 26.0–34.0)
MCHC: 29.5 g/dL — ABNORMAL LOW (ref 30.0–36.0)
MCV: 97.4 fL (ref 80.0–100.0)
Platelets: 220 10*3/uL (ref 150–400)
RBC: 2.68 MIL/uL — ABNORMAL LOW (ref 4.22–5.81)
RDW: 17.2 % — ABNORMAL HIGH (ref 11.5–15.5)
WBC: 6 10*3/uL (ref 4.0–10.5)
nRBC: 0.7 % — ABNORMAL HIGH (ref 0.0–0.2)

## 2018-07-03 LAB — GLUCOSE, CAPILLARY
Glucose-Capillary: 144 mg/dL — ABNORMAL HIGH (ref 70–99)
Glucose-Capillary: 183 mg/dL — ABNORMAL HIGH (ref 70–99)
Glucose-Capillary: 209 mg/dL — ABNORMAL HIGH (ref 70–99)
Glucose-Capillary: 131 mg/dL — ABNORMAL HIGH (ref 70–99)

## 2018-07-03 LAB — BASIC METABOLIC PANEL
Anion gap: 8 (ref 5–15)
BUN: 31 mg/dL — ABNORMAL HIGH (ref 8–23)
CO2: 34 mmol/L — ABNORMAL HIGH (ref 22–32)
Calcium: 8.3 mg/dL — ABNORMAL LOW (ref 8.9–10.3)
Chloride: 98 mmol/L (ref 98–111)
Creatinine, Ser: 1.99 mg/dL — ABNORMAL HIGH (ref 0.61–1.24)
GFR calc Af Amer: 35 mL/min — ABNORMAL LOW (ref >60)
GFR calc non Af Amer: 30 mL/min — ABNORMAL LOW (ref >60)
Glucose, Bld: 149 mg/dL — ABNORMAL HIGH (ref 70–99)
Potassium: 4.2 mmol/L (ref 3.5–5.1)
Sodium: 140 mmol/L (ref 135–145)

## 2018-07-03 LAB — ECHOCARDIOGRAM COMPLETE
Height: 68 in
Weight: 4179.2 oz

## 2018-07-03 MED ORDER — METOPROLOL TARTRATE 50 MG PO TABS
50.0000 mg | ORAL_TABLET | Freq: Two times a day (BID) | ORAL | Status: DC
  Administered 2018-07-03 – 2018-07-05 (×4): 50 mg via ORAL
  Filled 2018-07-03 (×4): qty 1

## 2018-07-03 NOTE — Progress Notes (Signed)
[place] [facility] [place] NAME: Arthur [name]    MR#:  [id number]  DATE OF BIRTH:  [birth year]  SUBJECTIVE:   Patient is seen at the bedside.  Shortness of breath is slightly better but still has exertional dyspnea, lives on 2 L of oxygen chronically but requiring 4 L of oxygen with minimal ambulation today with physical therapy.  Chest x-ray has revealed pleural effusions .  Capsule endoscopy done on 06/28/2018-poor visualization, repeat capsule study is normal  EGD, colonoscopy were negative REVIEW OF SYSTEMS:  Review of Systems  Constitutional: Negative for fever.  Gastrointestinal: Negative for blood in stool.   Constitutional: Negative for chills, fever, malaise/fatigue and weight loss.  HENT: Negative for congestion, hearing loss and sore throat.   Eyes: Negative for blurred vision and double vision.  Respiratory: neg sob Cardiovascular: Positive for orthopnea and leg swelling. Negative for chest pain and palpitations.  Gastrointestinal: denies melena  Negative for diarrhea, nausea and vomiting.     Genitourinary: Negative for dysuria, hematuria and urgency.  Musculoskeletal: Negative for myalgias.  Skin: Negative for rash.  Neurological: Negative for dizziness, sensory change, speech change, focal weakness and headaches.  Psychiatric/Behavioral: Negative for depression.  DRUG ALLERGIES:   Allergies  Allergen Reactions  . Codeine Other (See Comments)    Constipation   VITALS:  Blood pressure (!) 160/53, pulse 77, temperature 97.7 F (36.5 C), temperature source Oral, resp. rate 19, height 5\' 8"  (1.727 m), weight 115.1 kg, SpO2 100 %. PHYSICAL EXAMINATION:   Physical Exam  GENERAL:  85 y.o.-year-old patient lying in the bed with no acute distress.  EYES: Pupils equal, round, reactive to light and accommodation. No scleral icterus. Extraocular muscles intact.  HEENT: Head atraumatic, normocephalic. Oropharynx and nasopharynx clear.   NECK:  Supple, no jugular venous distention. No thyroid enlargement, no tenderness.  LUNGS: Diminished breath sounds bilaterally, no wheezing, bilateral rhonchi, no rales or crepitation. No use of accessory muscles of respiration.  CARDIOVASCULAR: S1, S2 normal. No murmurs, rubs, or gallops.  ABDOMEN: Distended, non-tender to palpation. Bowel sounds present. No organomegaly or mass.  EXTREMITIES: Bilateral pitting edema of lower extremities with chronic anterior erythema, tenderness present.  No cyanosis, or clubbing. No rash or lesions. + pedal pulses MUSCULOSKELETAL: Normal bulk, and power was 5+ grip and elbow, knee, and ankle flexion and extension bilaterally.  NEUROLOGIC:Alert and oriented x 3. CN 2-12 intact. Sensation to light touch and cold stimuli intact bilaterally. Finger to nose nl. Babinski is downgoing. DTR's (biceps, patellar, and achilles) 2+ and symmetric throughout. Gait not tested due to safety concern. PSYCHIATRIC: The patient is alert and oriented x 3.  SKIN: As below    DATA REVIEWED:  LABORATORY PANEL:  Male CBC Recent Labs  Lab 07/03/18 0301  WBC 6.0  HGB 7.7*  HCT 26.1*  PLT 220   ------------------------------------------------------------------------------------------------------------------ Chemistries  Recent Labs  Lab 06/27/18 1311 06/28/18 0101  07/03/18 0301  NA 142 140   < > 140  K 2.8* 3.2*   < > 4.2  CL 101 102   < > 98  CO2 32 31   < > 34*  GLUCOSE 98 171*   < > 149*  BUN 28* 26*   < > 31*  CREATININE 1.96* 1.98*   < > 1.99*  CALCIUM 8.0* 7.7*   < > 8.3*  MG  --  1.8  --   --   AST 18  --   --   --  ALT 14  --   --   --   ALKPHOS 44  --   --   --   BILITOT 0.5  --   --   --    < > = values in this interval not displayed.   RADIOLOGY:  Dg Chest Port 1 View  Result Date: 07/02/2018 CLINICAL DATA:  Cough EXAM: PORTABLE CHEST 1 VIEW COMPARISON:  06/09/2018 FINDINGS: There are new small to moderate bilateral layering pleural  effusions with similar mild, diffuse interstitial pulmonary opacity. There is no new focal airspace opacity. Cardiomegaly. IMPRESSION: There are new small to moderate bilateral layering pleural effusions with similar mild, diffuse interstitial pulmonary opacity. There is no new focal airspace opacity. Cardiomegaly. Electronically Signed   By: Eddie Candle M.D.   On: 07/02/2018 12:28   ASSESSMENT AND PLAN:   85 y.o. male 85 y.o. male past medical history of BPH (benign prostatic hyperplasia), Chronic kidney disease (CKD), stage III (moderate),COPD, Hyperlipidemia, Hypertension, Morbid obesity (CMS-HCC), and OSA (obstructive sleep apnea) presenting as direct admit from pulmonary clinic with hgb 5.8 and lower extremities cellulitis.  1.  Acute on chronic hypoxic respiratory distress secondary to pleural effusions bilaterally secondary to diastolic dysfunction Echocardiogram has revealed 55 to 60% ejection fraction with normal systolic function.  No evidence of pericardial effusion. Lasix IV, Daily weight monitoring, intake and output .discussed with cardiology curbside, will start the patient on beta-blocker and discontinue amlodipine in view of lower extremity edema.  Outpatient follow-up with [facility] cardiology in a week after discharge Monitor renal function closely  2. GI Bleed - s/p recent EGD and colonoscopy x2  without a source of bleed identified. - s/p endoscopy capsule study -06/28/2018 with poor visualization.  Repeat capsule endoscopy on 06/30/2018 is also normal as per my discussion with the GI Dr.Tahiliani.  Anemia panel ordered which includes iron labs, B12 and folate -Repeat CBC and BMP in a.m. - pantoprazole 40 mg  BID -Monitor hemoglobin hematocrit - Will hold off further imaging since he has had recent work up including CT abdomen, - GI input appreciated - Hold NSAIDs, steroids, ASA -COVID negative -If  no further GI bleed and pt hemodynamically stable plan is to discharge patient  in a.m. with outpatient GI follow-up on 07/15/18  3. Bilateral lower extremity erythema with edema secondary to venous stasis less likely cellulitis the patient was treated for recurrent cellulitis and recent strep B bacteremia - Previous work-up with ultrasound negative for DVT - Recently treated with broad-spectrum antibiotic coverage with cefepime and vancomycin on 6/7 - Recent treatment with Doxy given as outpatient - No further abx treatment recommended by infectious disease. Continue care with leg elevation by 3 pillows and applying Coban compression  -Negative blood cultures - ID input appreciated  4. Acute on Chronic severe anemia -likely secondary to GI bleed as above - Hemoglobin 5.8 s/p 2 units of PRBC with improvement in hgb to 7.2-6.7, transfuse 1 unit of blood-hemoglobin 7.7-7.7 from 7.9 - H&H monitoring frequently - Transfuse PRN Hgb<7 - Recent work up 06/19/18 including iron panel, serum iron at 26 which is low, TIBC 176 which is low, normal feritin, B12 and folate.  Given IV Venofer   5. DM: His sugars have not been well-controlled on current regimen  - Hold home meds - Continue sliding scale insulin coverage   6. Chronic kidney disease -BUN and creatinine improved from baseline - Continue to monitor renal function .  Creatinine 1.98-1.72--1.99  7. Advanced COPD= no evidence of  exacerbation  - DuoNeb every 6 hours  - Incentive spirometry every hour while awake  - Patient has O2 per nasal cannula at 2 L/min  - Follows with pulmonary as outpatient  8. HLD =Goal LDL<70. Pravastatin 10 mg  9. HTN- slightly elevated, Goal BP <140/90 - Continue amlodipine titrate medications as needed   All the records are reviewed and case discussed with Care Management/Social Worker. Management plans discussed with the patient, he is  in agreement.  Plan of care discussed with the patient's daughter [name]  CODE STATUS: DNR  TOTAL TIME TAKING CARE OF THIS PATIENT:35 minutes.    More than 50% of the time was spent in counseling/coordination of care: YES  POSSIBLE D/C IN 1 DAYS, DEPENDING ON CLINICAL CONDITION.  on 07/03/2018 at 2:35 PM  Rufina Falco, DNP, FNP-BC [facility] Nurse Practitioner Between 7am to 6pm - Pager [phone]  After 6pm go to [email or web address] - password [email or web address] [place] [facility]  Office  [phone]  CC: Primary care physician; Baxter Hire, MD  Note: This dictation was prepared with Dragon dictation along with smaller phrase technology. Any transcriptional errors that result from this process are unintentional.

## 2018-07-03 NOTE — Progress Notes (Signed)
Arthur Antigua, MD [address] [place], [place], [place], [address] [address], [place], [place], [place], [address] Phone: [phone]  Fax: [phone]   Subjective: No episodes of GI bleeding.  Denies any weakness or shortness of breath.  No abdominal pain.  Tolerating oral diet.   Objective: Exam: Vital signs in last 24 hours: Vitals:   07/02/18 1947 07/02/18 2005 07/03/18 0530 07/03/18 0727  BP: (!) 147/51  (!) 167/63 (!) 160/53  Pulse: 64  73 77  Resp: 18  18 19   Temp: 97.9 F (36.6 C)  97.9 F (36.6 C) 97.7 F (36.5 C)  TempSrc: Oral  Oral Oral  SpO2: 99% 98% 99% 100%  Weight:   115.1 kg   Height:       Weight change:   Intake/Output Summary (Last 24 hours) at 07/03/2018 1705 Last data filed at 07/03/2018 1529 Gross per 24 hour  Intake -  Output 576 ml  Net -576 ml    General: No acute distress, AAO x3 Abd: Soft, NT/ND, No HSM Skin: Warm, no rashes Neck: Supple, Trachea midline   Lab Results: Lab Results  Component Value Date   WBC 6.0 07/03/2018   HGB 7.7 (L) 07/03/2018   HCT 26.1 (L) 07/03/2018   MCV 97.4 07/03/2018   PLT 220 07/03/2018   Micro Results: Recent Results (from the past 240 hour(s))  Culture, blood (routine x 2)     Status: None   Collection Time: 06/27/18  1:11 PM   Specimen: BLOOD  Result Value Ref Range Status   Specimen Description BLOOD RIGHT ANTECUBITAL  Final   Special Requests   Final    BOTTLES DRAWN AEROBIC AND ANAEROBIC Blood Culture results may not be optimal due to an excessive volume of blood received in culture bottles   Culture   Final    NO GROWTH 5 DAYS Performed at [facility], [address] [place], [place] [address]    Report Status 07/02/2018 FINAL  Final  Culture, blood (routine x 2)     Status: None   Collection Time: 06/27/18  2:31 PM   Specimen: BLOOD  Result Value Ref Range Status   Specimen Description BLOOD RIGHT ANTECUBITAL  Final   Special Requests   Final    BOTTLES  DRAWN AEROBIC AND ANAEROBIC Blood Culture adequate volume   Culture   Final    NO GROWTH 5 DAYS Performed at [facility], [address] [place], [place] [address]    Report Status 07/02/2018 FINAL  Final  SARS Coronavirus 2 (CEPHEID - Performed in [place] hospital lab), Hosp Order     Status: None   Collection Time: 06/27/18  6:15 PM   Specimen: Nasopharyngeal Swab  Result Value Ref Range Status   SARS Coronavirus 2 NEGATIVE NEGATIVE Final    Comment: (NOTE) If result is NEGATIVE SARS-CoV-2 target nucleic acids are NOT DETECTED. The SARS-CoV-2 RNA is generally detectable in upper and lower  respiratory specimens during the acute phase of infection. The lowest  concentration of SARS-CoV-2 viral copies this assay can detect is 250  copies / mL. A negative result does not preclude SARS-CoV-2 infection  and should not be used as the sole basis for treatment or other  patient management decisions.  A negative result may occur with  improper specimen collection / handling, submission of specimen other  than nasopharyngeal swab, presence of viral mutation(s) within the  areas targeted by this assay, and inadequate number of viral  copies  (<250 copies / mL). A negative result must be combined with clinical  observations, patient history, and epidemiological information. If result is POSITIVE SARS-CoV-2 target nucleic acids are DETECTED. The SARS-CoV-2 RNA is generally detectable in upper and lower  respiratory specimens dur ing the acute phase of infection.  Positive  results are indicative of active infection with SARS-CoV-2.  Clinical  correlation with patient history and other diagnostic information is  necessary to determine patient infection status.  Positive results do  not rule out bacterial infection or co-infection with other viruses. If result is PRESUMPTIVE POSTIVE SARS-CoV-2 nucleic acids MAY BE PRESENT.   A presumptive positive result was obtained on the  submitted specimen  and confirmed on repeat testing.  While 2019 novel coronavirus  (SARS-CoV-2) nucleic acids may be present in the submitted sample  additional confirmatory testing may be necessary for epidemiological  and / or clinical management purposes  to differentiate between  SARS-CoV-2 and other Sarbecovirus currently known to infect humans.  If clinically indicated additional testing with an alternate test  methodology [phone]) is advised. The SARS-CoV-2 RNA is generally  detectable in upper and lower respiratory sp ecimens during the acute  phase of infection. The expected result is Negative. Fact Sheet for Patients:  StrictlyIdeas.no Fact Sheet for Healthcare Providers: BankingDealers.co.za This test is not yet approved or cleared by the [place] FDA and has been authorized for detection and/or diagnosis of SARS-CoV-2 by FDA under an Emergency Use Authorization (EUA).  This EUA will remain in effect (meaning this test can be used) for the duration of the COVID-19 declaration under Section 564(b)(1) of the Act, 21 U.S.C. section 360bbb-3(b)(1), unless the authorization is terminated or revoked sooner. Performed at [facility], [address] [place], [place] [address]    Studies/Results: Dg Chest Port 1 View  Result Date: 07/02/2018 CLINICAL DATA:  Cough EXAM: PORTABLE CHEST 1 VIEW COMPARISON:  06/09/2018 FINDINGS: There are new small to moderate bilateral layering pleural effusions with similar mild, diffuse interstitial pulmonary opacity. There is no new focal airspace opacity. Cardiomegaly. IMPRESSION: There are new small to moderate bilateral layering pleural effusions with similar mild, diffuse interstitial pulmonary opacity. There is no new focal airspace opacity. Cardiomegaly. Electronically Signed   By: Eddie Candle M.D.   On: 07/02/2018 12:28   Medications:  Scheduled Meds: . furosemide  20 mg  Intravenous Q12H  . insulin aspart  0-5 Units Subcutaneous QHS  . insulin aspart  0-9 Units Subcutaneous TID WC  . metoprolol tartrate  50 mg Oral BID  . nystatin cream   Topical BID  . pantoprazole  40 mg Intravenous Q12H  . pravastatin  10 mg Oral QHS  . tamsulosin  0.4 mg Oral Daily   Continuous Infusions: . sodium chloride Stopped (06/30/18 1730)   PRN Meds:.sodium chloride, albuterol, guaiFENesin-dextromethorphan, ipratropium-albuterol, metoprolol tartrate   Assessment: Active Problems:   Severe anemia    Plan: Hemoglobin has stayed stable over the last 3 to 4 days without any evidence of active GI bleeding  Patient to follow-up with Dr. Vicente Males upon discharge  Avoid NSAIDs     LOS: 6 days   Arthur Antigua, MD 07/03/2018, 5:05 PM

## 2018-07-03 NOTE — TOC Transition Note (Signed)
Transition of Care [facility]) - CM/SW Discharge Note   Patient Details  Name: NORMON PETTIJOHN MRN: [id number] Date of Birth: 1933-09-16  Transition of Care [facility]) CM/SW Contact:  Katrina Stack, RN Phone Number: 07/03/2018, 10:36 AM   Clinical Narrative:   Anticipate discharge today. home health has been set up    Final next level of care: [place] Barriers to Discharge: No Barriers Identified   Patient Goals and CMS Choice Patient states their goals for this hospitalization and ongoing recovery are:: to go home and resume home health services with [facility] CMS Medicare.gov Compare Post Acute Care list provided to:: Patient Choice offered to / list presented to : Patient  Discharge Placement                       Discharge Plan and Services In-house Referral: [facility] Discharge Planning Services: CM Consult                      HH Arranged: RN, PT, OT, Nurse's Aide, Social Work [employer] Agency: [place] ([place]) Date [place]: 07/02/18 Time [place]: 1004 Representative spoke with at [place]: [place] ([place]) Interventions     Readmission Risk Interventions Readmission Risk Prevention Plan 07/02/2018 05/26/2018  Transportation Screening Complete Complete  PCP or Specialist Appt within 5-7 Days Complete -  Home Care Screening Complete Complete  Medication Review (RN CM) Complete Complete  Some recent data might be hidden

## 2018-07-03 NOTE — TOC Progression Note (Signed)
Transition of Care [facility]) - Progression Note    Patient Details  Name: Arthur [name] MRN: [id number] Date of Birth: October 15, 1933  Transition of Care [facility]) CM/SW Contact  Katrina Stack, RN Phone Number: 07/03/2018, 10:32 AM  Clinical Narrative:   Patient was to have discharged yesterday but postponed due to shortness of breath.  Received IV Lasix. Echocardiogram unremarkable. Updated [facility] and will obtain order for home health RN PT Aide and SW    Expected Discharge Plan: [place] Barriers to Discharge: No Barriers Identified  Expected Discharge Plan and Services Expected Discharge Plan: [place] In-house Referral: [facility] Discharge Planning Services: CM Consult   Living arrangements for the past 2 months: Single Family Home Expected Discharge Date: 07/02/18                         HH Arranged: RN, PT, OT, Nurse's Aide, Social Work [employer] Agency: [place] ([place]) Date [place]: 07/02/18 Time [place]: 1004 Representative spoke with at [place]: [place] ([place]) Interventions    Readmission Risk Interventions Readmission Risk Prevention Plan 07/02/2018 05/26/2018  Transportation Screening Complete Complete  PCP or Specialist Appt within 5-7 Days Complete -  Home Care Screening Complete Complete  Medication Review (RN CM) Complete Complete  Some recent data might be hidden

## 2018-07-04 LAB — BASIC METABOLIC PANEL
Anion gap: 4 — ABNORMAL LOW (ref 5–15)
BUN: 35 mg/dL — ABNORMAL HIGH (ref 8–23)
CO2: 40 mmol/L — ABNORMAL HIGH (ref 22–32)
Calcium: 8.2 mg/dL — ABNORMAL LOW (ref 8.9–10.3)
Chloride: 97 mmol/L — ABNORMAL LOW (ref 98–111)
Creatinine, Ser: 2.11 mg/dL — ABNORMAL HIGH (ref 0.61–1.24)
GFR calc Af Amer: 33 mL/min — ABNORMAL LOW (ref >60)
GFR calc non Af Amer: 28 mL/min — ABNORMAL LOW (ref >60)
Glucose, Bld: 144 mg/dL — ABNORMAL HIGH (ref 70–99)
Potassium: 4.1 mmol/L (ref 3.5–5.1)
Sodium: 141 mmol/L (ref 135–145)

## 2018-07-04 LAB — GLUCOSE, CAPILLARY
Glucose-Capillary: 126 mg/dL — ABNORMAL HIGH (ref 70–99)
Glucose-Capillary: 171 mg/dL — ABNORMAL HIGH (ref 70–99)
Glucose-Capillary: 198 mg/dL — ABNORMAL HIGH (ref 70–99)
Glucose-Capillary: 174 mg/dL — ABNORMAL HIGH (ref 70–99)

## 2018-07-04 MED ORDER — FUROSEMIDE 10 MG/ML IJ SOLN
20.0000 mg | Freq: Two times a day (BID) | INTRAMUSCULAR | Status: DC
  Administered 2018-07-04: 20 mg via INTRAVENOUS
  Filled 2018-07-04: qty 2

## 2018-07-04 MED ORDER — FUROSEMIDE 20 MG PO TABS
20.0000 mg | ORAL_TABLET | Freq: Two times a day (BID) | ORAL | Status: DC
  Administered 2018-07-04 – 2018-07-05 (×2): 20 mg via ORAL
  Filled 2018-07-04 (×2): qty 1

## 2018-07-04 NOTE — Progress Notes (Addendum)
[facility] NAME: Arthur [name]    MR#:  [id number]  DATE OF BIRTH:  03-Oct-1933  SUBJECTIVE:   Patient is seen at the bedside. Shortness of breath is improved Patient has been weaned to oxygen via nasal cannula at 2 L from 4 L No complaints of chest  Chest x-ray has revealed pleural effusions .  Capsule endoscopy done on 06/28/2018-poor visualization, repeat capsule study is normal  EGD, colonoscopy were negative REVIEW OF SYSTEMS:  Review of Systems  Constitutional: Negative for fever.  Gastrointestinal: Negative for blood in stool.   Constitutional: Negative for chills, fever, malaise/fatigue and weight loss.  HENT: Negative for congestion, hearing loss and sore throat.   Eyes: Negative for blurred vision and double vision.  Respiratory: neg sob Cardiovascular: Positive for orthopnea and leg swelling. Negative for chest pain and palpitations.  Gastrointestinal: denies melena  Negative for diarrhea, nausea and vomiting.     Genitourinary: Negative for dysuria, hematuria and urgency.  Musculoskeletal: Negative for myalgias.  Skin: Negative for rash.  Neurological: Negative for dizziness, sensory change, speech change, focal weakness and headaches.  Psychiatric/Behavioral: Negative for depression.  DRUG ALLERGIES:   Allergies  Allergen Reactions  . Codeine Other (See Comments)    Constipation   VITALS:  Blood pressure (!) 133/47, pulse (!) 52, temperature 98.5 F (36.9 C), temperature source Oral, resp. rate 19, height 5\' 8"  (1.727 m), weight 114.8 kg, SpO2 97 %. PHYSICAL EXAMINATION:   Physical Exam  GENERAL:  85 y.o.-year-old patient lying in the bed with no acute distress.  EYES: Pupils equal, round, reactive to light and accommodation. No scleral icterus. Extraocular muscles intact.  HEENT: Head atraumatic, normocephalic. Oropharynx and nasopharynx clear.  NECK:  Supple, no jugular venous distention. No thyroid  enlargement, no tenderness.  LUNGS: Diminished breath sounds bilaterally, no wheezing, bilateral rhonchi, no rales or crepitation. No use of accessory muscles of respiration.  CARDIOVASCULAR: S1, S2 normal. No murmurs, rubs, or gallops.  ABDOMEN: Distended, non-tender to palpation. Bowel sounds present. No organomegaly or mass.  EXTREMITIES: Bilateral pitting edema of lower extremities with chronic anterior erythema, tenderness present.  No cyanosis, or clubbing. No rash or lesions. + pedal pulses MUSCULOSKELETAL: Normal bulk, and power was 5+ grip and elbow, knee, and ankle flexion and extension bilaterally.  NEUROLOGIC:Alert and oriented x 3. CN 2-12 intact. Sensation to light touch and cold stimuli intact bilaterally. Finger to nose nl. Babinski is downgoing. DTR's (biceps, patellar, and achilles) 2+ and symmetric throughout. Gait not tested due to safety concern. PSYCHIATRIC: The patient is alert and oriented x 3.  SKIN: As below    DATA REVIEWED:  LABORATORY PANEL:  Male CBC Recent Labs  Lab 07/03/18 0301  WBC 6.0  HGB 7.7*  HCT 26.1*  PLT 220   ------------------------------------------------------------------------------------------------------------------ Chemistries  Recent Labs  Lab 06/28/18 0101  07/04/18 0626  NA 140   < > 141  K 3.2*   < > 4.1  CL 102   < > 97*  CO2 31   < > 40*  GLUCOSE 171*   < > 144*  BUN 26*   < > 35*  CREATININE 1.98*   < > 2.11*  CALCIUM 7.7*   < > 8.2*  MG 1.8  --   --    < > = values in this interval not displayed.   RADIOLOGY:  No results found. ASSESSMENT AND PLAN:   85 y.o. male 85 y.o. male  past medical history of BPH (benign prostatic hyperplasia), Chronic kidney disease (CKD), stage III (moderate),COPD, Hyperlipidemia, Hypertension, Morbid obesity (CMS-HCC), and OSA (obstructive sleep apnea) presenting as direct admit from pulmonary clinic with hgb 5.8 and lower extremities cellulitis.  1.  Acute on chronic hypoxic  respiratory distress secondary to pleural effusions bilaterally secondary to diastolic dysfunction Echocardiogram has revealed 55 to 60% ejection fraction with normal systolic function.  No evidence of pericardial effusion. Lasix IV, Daily weight monitoring, intake and output .discussed with cardiology curbside, will start the patient on beta-blocker and discontinue amlodipine in view of lower extremity edema.  Outpatient follow-up with [facility] cardiology in a week after discharge Monitor renal function closely Back to baseline oxygen via nasal cannula at 2 L  2. GI Bleed - s/p recent EGD and colonoscopy x2  without a source of bleed identified. - s/p endoscopy capsule study -06/28/2018 with poor visualization.  Repeat capsule endoscopy on 06/30/2018 is also normal as per my discussion with the GI Dr.Tahiliani.  Anemia panel ordered which includes iron labs, B12 and folate -Repeat CBC and BMP in a.m. - pantoprazole 40 mg  BID -Monitor hemoglobin hematocrit - Will hold off further imaging since he has had recent work up including CT abdomen, - GI input appreciated - Hold NSAIDs, steroids, ASA -COVID negative -If  no further GI bleed and pt hemodynamically stable plan is to discharge patient in a.m. with outpatient GI follow-up on 07/15/18 Status post GI follow-up and no further intervention recommended  3. Bilateral lower extremity erythema with edema secondary to venous stasis less likely cellulitis the patient was treated for recurrent cellulitis and recent strep B bacteremia - Previous work-up with ultrasound negative for DVT - Recently treated with broad-spectrum antibiotic coverage with cefepime and vancomycin on 6/7 - Recent treatment with Doxy given as outpatient - No further abx treatment recommended by infectious disease. Continue care with leg elevation by 3 pillows and applying Coban compression  -Negative blood cultures - ID input appreciated  4. Acute on Chronic severe anemia  -likely secondary to GI bleed as above - Hemoglobin 5.8 s/p 2 units of PRBC with improvement in hgb to 7.2-6.7, transfuse 1 unit of blood-hemoglobin 7.7-7.7 from 7.9 - H&H monitoring frequently - Transfuse PRN Hgb<7 - Recent work up 06/19/18 including iron panel, serum iron at 26 which is low, TIBC 176 which is low, normal feritin, B12 and folate.  Given IV Venofer   5. DM: His sugars have not been well-controlled on current regimen  - Hold home meds - Continue sliding scale insulin coverage   6. Chronic kidney disease -BUN and creatinine improved from baseline - Continue to monitor renal function .  Creatinine 1.98-1.72--1.99  7. Advanced COPD= no evidence of exacerbation  - DuoNeb every 6 hours  - Incentive spirometry every hour while awake  - Patient has O2 per nasal cannula at 2 L/min  - Follows with pulmonary as outpatient  8. HLD =Goal LDL<70. Pravastatin 10 mg  9. HTN- slightly elevated, Goal BP <140/90 - Continue amlodipine titrate medications as needed  10.  Ambulatory dysfunction Status post physical therapy evaluation Recommended home with home health services once clinically stable  11.  Discussed medical condition treatment plan with patient's daughter in detail   All the records are reviewed and case discussed with Care Management/Social Worker. Management plans discussed with the patient, he is  in agreement.  Plan of care discussed with the patient's daughter   CODE STATUS: DNR  TOTAL TIME TAKING  CARE OF THIS PATIENT:36 minutes.   More than 50% of the time was spent in counseling/coordination of care: YES  POSSIBLE D/C IN 1 DAYS, DEPENDING ON CLINICAL CONDITION.  on 07/04/2018 at 1:56 PM  Rufina Falco, DNP, FNP-BC [facility] Nurse Practitioner Between 7am to 6pm - Pager [phone]  After 6pm go to [email or web address] - password [email or web address] [facility]  Office  [phone]  CC: Primary care physician; Baxter Hire, MD   Note: This dictation was prepared with Dragon dictation along with smaller phrase technology. Any transcriptional errors that result from this process are unintentional.

## 2018-07-04 NOTE — Care Management Important Message (Signed)
Important Message  Patient Details  Name: BAYNE FOSNAUGH MRN: [id number] Date of Birth: [birth year]   Medicare Important Message Given:  Yes     Dannette Barbara 07/04/2018, 1:23 PM

## 2018-07-04 NOTE — Progress Notes (Signed)
Physical Therapy Treatment Patient Details Name: Arthur [name] MRN: [id number] DOB: May 17, 1933 Today's Date: 07/04/2018    History of Present Illness Pt is an 85 year old male admitted with a GI bleed.  Pt recently admitted with low hemoglobin, cellulitis and severe anemia.  PMH includes COPD, HLD, DM, Htn, BPH and CKD III.    PT Comments    Pt agreeable to PT; reports mild pain in B feet/distal LEs that does not worsen with stand static/dynamic activity. Pt quick to request assist without attempting activity with self assist first. Pt encouraged during session particularly with bed mobility to self assist. Ultimately Min A to sit to edge of bed, Mod A to return to supine from sit and Min A to reposition toward head of bed in supine. Pt demonstrates some impulsive tendencies with attempts to stand with poor technique. Pt educated on proper technique and is unable to initiate stand from 90/90 seated position. Bed elevated and pt able to perform with good technique and Min guard. After several STS transfers, pt then able to perform from 90/90 position with Min A and increased feeling of weakness/unsteadiness. O2 saturation levels on 4L supplemental O2 99% at rest and 93-96% with seated and stand exercises. Pt requires frequent rest breaks. Encouraged up in the chair at session's end; however, pt declines wishing return to bed. Educated and encouraged on basic supine exercises for strengthening.    Follow Up Recommendations  Home health PT;Supervision for mobility/OOB     Equipment Recommendations       Recommendations for Other Services       Precautions / Restrictions Precautions Precautions: Fall Restrictions Weight Bearing Restrictions: No    Mobility  Bed Mobility Overal bed mobility: Needs Assistance Bed Mobility: Supine to Sit;Sit to Supine     Supine to sit: Min assist;HOB elevated Sit to supine: Mod assist;HOB elevated   General bed mobility comments: eager to request assist  without trying. Requires encouragement for in/out of bed with self effort for transfers and repositioning upward in bed  Transfers Overall transfer level: Needs assistance Equipment used: Rolling walker (2 wheeled) Transfers: Sit to/from Stand Sit to Stand: Min guard;From elevated surface(Min A from lower surface)         General transfer comment: initially unable to stand from standard surface height. Min guard from elevated surface. Min A from standard height post several from elevated surface  Ambulation/Gait             General Gait Details: deferred, as pt felt unsteady/weak   Stairs             Wheelchair Mobility    Modified Rankin (Stroke Patients Only)       Balance Overall balance assessment: Needs assistance Sitting-balance support: Feet supported;Bilateral upper extremity supported Sitting balance-Leahy Scale: Good     Standing balance support: Bilateral upper extremity supported Standing balance-Leahy Scale: Fair Standing balance comment: Pt with subjective complaints of weakness/unsteadiness                            Cognition Arousal/Alertness: Awake/alert(initially sleeping; awoken through voice) Behavior During Therapy: WFL for tasks assessed/performed;Impulsive(mildly impulsive; other times request more help than needed) Overall Cognitive Status: No family/caregiver present to determine baseline cognitive functioning  Exercises General Exercises - Lower Extremity Ankle Circles/Pumps: AROM;Both;20 reps Quad Sets: Strengthening;Both;20 reps Gluteal Sets: Strengthening;Both;20 reps Long Arc Quad: AROM;Both;20 reps Hip Flexion/Marching: AROM;Both;20 reps;Seated;Other (comment)(also 3 sets of 10 in stand) Other Exercises Other Exercises: static stand and stand wt shift with single QS    General Comments        Pertinent Vitals/Pain Pain Assessment: Faces Faces Pain  Scale: Hurts a little bit Pain Location: B feet/distal LEs Pain Intervention(s): Monitored during session    Home Living                      Prior Function            PT Goals (current goals can now be found in the care plan section) Progress towards PT goals: Progressing toward goals(slowly)    Frequency    Min 2X/week      PT Plan Current plan remains appropriate    Co-evaluation              AM-PAC PT "6 Clicks" Mobility   Outcome Measure  Help needed turning from your back to your side while in a flat bed without using bedrails?: A Little Help needed moving from lying on your back to sitting on the side of a flat bed without using bedrails?: A Little Help needed moving to and from a bed to a chair (including a wheelchair)?: A Little Help needed standing up from a chair using your arms (e.g., wheelchair or bedside chair)?: A Little Help needed to walk in hospital room?: A Little Help needed climbing 3-5 steps with a railing? : A Lot 6 Click Score: 17    End of Session Equipment Utilized During Treatment: Gait belt;Oxygen Activity Tolerance: Patient limited by fatigue;Other (comment)(weakness) Patient left: in bed;with call bell/phone within reach;with bed alarm set   PT Visit Diagnosis: Difficulty in walking, not elsewhere classified (R26.2);Muscle weakness (generalized) (M62.81)     Time: 0109-3235 PT Time Calculation (min) (ACUTE ONLY): 36 min  Charges:  $Therapeutic Exercise: 8-22 mins $Therapeutic Activity: 8-22 mins                      Larae Grooms, PTA 07/04/2018, 11:54 AM

## 2018-07-04 NOTE — Progress Notes (Signed)
Arthur Antigua, MD [address] [place], [place], [place], [address] [address], [place], [place], [place], [address] Phone: [phone]  Fax: [phone]   Subjective: No episodes of GI bleeding.  No abdominal pain   Objective: Exam: Vital signs in last 24 hours: Vitals:   07/04/18 0750 07/04/18 0955 07/04/18 1141 07/04/18 1143  BP: (!) 156/52  (!) 133/47   Pulse: 62 67 (!) 52   Resp: 19  19   Temp: 98.5 F (36.9 C)  98.5 F (36.9 C)   TempSrc: Oral  Oral   SpO2: 100% 99% 100% 97%  Weight:      Height:       Weight change: -0.318 kg  Intake/Output Summary (Last 24 hours) at 07/04/2018 1432 Last data filed at 07/04/2018 0900 Gross per 24 hour  Intake 240 ml  Output 1450 ml  Net -1210 ml    General: No acute distress, AAO x3 Abd: Soft, NT/ND, No HSM Skin: Warm, no rashes Neck: Supple, Trachea midline   Lab Results: Lab Results  Component Value Date   WBC 6.0 07/03/2018   HGB 7.7 (L) 07/03/2018   HCT 26.1 (L) 07/03/2018   MCV 97.4 07/03/2018   PLT 220 07/03/2018   Micro Results: Recent Results (from the past 240 hour(s))  Culture, blood (routine x 2)     Status: None   Collection Time: 06/27/18  1:11 PM   Specimen: BLOOD  Result Value Ref Range Status   Specimen Description BLOOD RIGHT ANTECUBITAL  Final   Special Requests   Final    BOTTLES DRAWN AEROBIC AND ANAEROBIC Blood Culture results may not be optimal due to an excessive volume of blood received in culture bottles   Culture   Final    NO GROWTH 5 DAYS Performed at [facility], [address] [place], [place] [address]    Report Status 07/02/2018 FINAL  Final  Culture, blood (routine x 2)     Status: None   Collection Time: 06/27/18  2:31 PM   Specimen: BLOOD  Result Value Ref Range Status   Specimen Description BLOOD RIGHT ANTECUBITAL  Final   Special Requests   Final    BOTTLES DRAWN AEROBIC AND ANAEROBIC Blood Culture adequate volume   Culture   Final    NO  GROWTH 5 DAYS Performed at [facility], [address] [place], [place] [address]    Report Status 07/02/2018 FINAL  Final  SARS Coronavirus 2 (CEPHEID - Performed in [facility] hospital lab), Hosp Order     Status: None   Collection Time: 06/27/18  6:15 PM   Specimen: Nasopharyngeal Swab  Result Value Ref Range Status   SARS Coronavirus 2 NEGATIVE NEGATIVE Final    Comment: (NOTE) If result is NEGATIVE SARS-CoV-2 target nucleic acids are NOT DETECTED. The SARS-CoV-2 RNA is generally detectable in upper and lower  respiratory specimens during the acute phase of infection. The lowest  concentration of SARS-CoV-2 viral copies this assay can detect is 250  copies / mL. A negative result does not preclude SARS-CoV-2 infection  and should not be used as the sole basis for treatment or other  patient management decisions.  A negative result may occur with  improper specimen collection / handling, submission of specimen other  than nasopharyngeal swab, presence of viral mutation(s) within the  areas targeted by this assay, and inadequate number of viral copies  (<250 copies / mL). A negative result must be combined with clinical  observations, patient history, and epidemiological information. If result is POSITIVE SARS-CoV-2 target nucleic acids are DETECTED. The SARS-CoV-2 RNA is generally detectable in upper and lower  respiratory specimens dur ing the acute phase of infection.  Positive  results are indicative of active infection with SARS-CoV-2.  Clinical  correlation with patient history and other diagnostic information is  necessary to determine patient infection status.  Positive results do  not rule out bacterial infection or co-infection with other viruses. If result is PRESUMPTIVE POSTIVE SARS-CoV-2 nucleic acids MAY BE PRESENT.   A presumptive positive result was obtained on the submitted specimen  and confirmed on repeat testing.  While 2019 novel coronavirus   (SARS-CoV-2) nucleic acids may be present in the submitted sample  additional confirmatory testing may be necessary for epidemiological  and / or clinical management purposes  to differentiate between  SARS-CoV-2 and other Sarbecovirus currently known to infect humans.  If clinically indicated additional testing with an alternate test  methodology [phone]) is advised. The SARS-CoV-2 RNA is generally  detectable in upper and lower respiratory sp ecimens during the acute  phase of infection. The expected result is Negative. Fact Sheet for Patients:  StrictlyIdeas.no Fact Sheet for Healthcare Providers: BankingDealers.co.za This test is not yet approved or cleared by the [place] FDA and has been authorized for detection and/or diagnosis of SARS-CoV-2 by FDA under an Emergency Use Authorization (EUA).  This EUA will remain in effect (meaning this test can be used) for the duration of the COVID-19 declaration under Section 564(b)(1) of the Act, 21 U.S.C. section 360bbb-3(b)(1), unless the authorization is terminated or revoked sooner. Performed at [facility], [address] [place], [place] [address]    Studies/Results: No results found. Medications:  Scheduled Meds: . furosemide  20 mg Oral BID  . insulin aspart  0-5 Units Subcutaneous QHS  . insulin aspart  0-9 Units Subcutaneous TID WC  . metoprolol tartrate  50 mg Oral BID  . nystatin cream   Topical BID  . pantoprazole  40 mg Intravenous Q12H  . pravastatin  10 mg Oral QHS  . tamsulosin  0.4 mg Oral Daily   Continuous Infusions: . sodium chloride Stopped (06/30/18 1730)   PRN Meds:.sodium chloride, albuterol, guaiFENesin-dextromethorphan, ipratropium-albuterol, metoprolol tartrate   Assessment: Active Problems:   Severe anemia    Plan: Hemoglobin has remained stable No episodes of GI bleeding GI service will sign off at this time  Patient to  follow-up with Dr. Vicente Males within 1 to 2 weeks of discharge for repeat hemoglobin check  Please page GI on-call with any questions or concerns   LOS: 7 days   Arthur Antigua, MD 07/04/2018, 2:32 PM

## 2018-07-04 NOTE — Progress Notes (Signed)
Bilateral leg dressing changed as ordered/ tolerated well

## 2018-07-05 LAB — CBC
HCT: 27.8 % — ABNORMAL LOW (ref 39.0–52.0)
Hemoglobin: 8.1 g/dL — ABNORMAL LOW (ref 13.0–17.0)
MCH: 28.2 pg (ref 26.0–34.0)
MCHC: 29.1 g/dL — ABNORMAL LOW (ref 30.0–36.0)
MCV: 96.9 fL (ref 80.0–100.0)
Platelets: 207 10*3/uL (ref 150–400)
RBC: 2.87 MIL/uL — ABNORMAL LOW (ref 4.22–5.81)
RDW: 17.3 % — ABNORMAL HIGH (ref 11.5–15.5)
WBC: 7.1 10*3/uL (ref 4.0–10.5)
nRBC: 0.3 % — ABNORMAL HIGH (ref 0.0–0.2)

## 2018-07-05 LAB — GLUCOSE, CAPILLARY
Glucose-Capillary: 163 mg/dL — ABNORMAL HIGH (ref 70–99)
Glucose-Capillary: 251 mg/dL — ABNORMAL HIGH (ref 70–99)

## 2018-07-05 MED ORDER — FUROSEMIDE 20 MG PO TABS: 20.0000 mg | ORAL_TABLET | Freq: Every day | ORAL | 0 refills | Status: DC

## 2018-07-05 NOTE — Progress Notes (Addendum)
Pt refused daily weight. Notify incoming shift. Will continue to monitor.

## 2018-07-05 NOTE — Progress Notes (Signed)
Discharge instructions explained to pt and pts daughter, Lisa/ verbalized an understanding/ iv and tele removed/ will transport off unit when ride arrives

## 2018-07-05 NOTE — Progress Notes (Signed)
Physical Therapy Treatment Patient Details Name: Arthur [name] MRN: [id number] DOB: 22-Aug-1933 Today's Date: 07/05/2018    History of Present Illness Pt is an 85 year old male admitted with a GI bleed.  Pt recently admitted with low hemoglobin, cellulitis and severe anemia.  PMH includes COPD, HLD, DM, Htn, BPH and CKD III.    PT Comments    Pt in chair, refusing gait stating he is too lazy.  Encouraged as pt wants to go home today and this would be a good way to help facilitate discharge.  Agrees.  O2 at 2 lpm sats 85%.  Increased to 3 lpm and sats increased to 95% at rest.  Stood to walker and he was able to walk 60' with walker and generally steady gait despite poor walker positioning and verbal cues to correct.  Pt keeps walker too far out in front of him stating his back hurts and it feels better this way.  Upon sitting, sats 85% on 3 lpm after gait.  Proper breathing encouraged and sats increased to 95%.  Pt left on 3 lpm and RN notified.  Stated he is on 2 lpm at home.   Follow Up Recommendations  Home health PT;Supervision for mobility/OOB     Equipment Recommendations  Rolling walker with 5" wheels;3in1 (PT)    Recommendations for Other Services       Precautions / Restrictions Precautions Precautions: Fall Restrictions Weight Bearing Restrictions: No    Mobility  Bed Mobility               General bed mobility comments: in recliner before and after  Transfers Overall transfer level: Needs assistance Equipment used: Rolling walker (2 wheeled) Transfers: Sit to/from Stand Sit to Stand: Min guard            Ambulation/Gait Ambulation/Gait assistance: Min guard Gait Distance (Feet): 60 Feet Assistive device: Rolling walker (2 wheeled) Gait Pattern/deviations: Step-to pattern;Step-through pattern;Decreased step length - right;Decreased step length - left Gait velocity: decreased   General Gait Details: irregular pattern   Stairs              Wheelchair Mobility    Modified Rankin (Stroke Patients Only)       Balance Overall balance assessment: Needs assistance Sitting-balance support: Feet supported;Bilateral upper extremity supported Sitting balance-Leahy Scale: Good     Standing balance support: Bilateral upper extremity supported Standing balance-Leahy Scale: Good Standing balance comment: generally steady but walks too far outside walker box                            Cognition Arousal/Alertness: Awake/alert Behavior During Therapy: WFL for tasks assessed/performed Overall Cognitive Status: Within Functional Limits for tasks assessed                                        Exercises      General Comments        Pertinent Vitals/Pain Pain Assessment: No/denies pain    Home Living                      Prior Function            PT Goals (current goals can now be found in the care plan section) Progress towards PT goals: Progressing toward goals    Frequency    Min 2X/week  PT Plan Current plan remains appropriate    Co-evaluation              AM-PAC PT "6 Clicks" Mobility   Outcome Measure  Help needed turning from your back to your side while in a flat bed without using bedrails?: A Little Help needed moving from lying on your back to sitting on the side of a flat bed without using bedrails?: A Little Help needed moving to and from a bed to a chair (including a wheelchair)?: A Little Help needed standing up from a chair using your arms (e.g., wheelchair or bedside chair)?: A Little Help needed to walk in hospital room?: A Little Help needed climbing 3-5 steps with a railing? : A Little 6 Click Score: 18    End of Session Equipment Utilized During Treatment: Gait belt;Oxygen Activity Tolerance: Patient limited by fatigue;Other (comment) Patient left: in chair;with call bell/phone within reach;with chair alarm set Nurse  Communication: Other (comment)       Time: 0037-9444 PT Time Calculation (min) (ACUTE ONLY): 13 min  Charges:  $Gait Training: 8-22 mins                     Chesley Noon, PTA 07/05/18, 10:10 AM

## 2018-07-05 NOTE — Plan of Care (Signed)
  Problem: Clinical Measurements: Goal: Ability to maintain clinical measurements within normal limits will improve Outcome: Progressing   Problem: Activity: Goal: Risk for activity intolerance will decrease Outcome: Progressing   Problem: Safety: Goal: Ability to remain free from injury will improve Outcome: Progressing   

## 2018-07-05 NOTE — Discharge Summary (Signed)
[facility] [place] NAME: Arthur [name]    MR#:  [id number]  DATE OF BIRTH:  1933/10/20  DATE OF ADMISSION:  06/27/2018 ADMITTING PHYSICIAN: Lang Snow, NP  DATE OF DISCHARGE: 07/05/2018  PRIMARY CARE PHYSICIAN: Baxter Hire, MD   ADMISSION DIAGNOSIS:  Acute Anemia  Chronic kidney disease stage III Hyperlipidemia Hypertension Bilateral lower extremity cellulitis Hypokalemia DISCHARGE DIAGNOSIS:  Active Problems:   Severe anemia Bilateral lower extremity cellulitis Hypokalemia Chronic kidney disease stage III Acute on chronic respiratory failure with hypoxia Pleural effusions Gastrointestinal bleeding Emphysema SECONDARY DIAGNOSIS:   Past Medical History:  Diagnosis Date  . COPD (chronic obstructive pulmonary disease) ([place])   . Diabetes mellitus without complication ([place])   . Hyperlipidemia   . Hypertension      ADMITTING HISTORY 85 y.o. male past medical history of BPH (benign prostatic hyperplasia), Chronic kidney disease (CKD), stage III (moderate),COPD, Hyperlipidemia, Hypertension, Morbid obesity (CMS-HCC), and OSA (obstructive sleep apnea) presenting as direct admit from pulmonary clinic with hgb 5.8 and lower extremities cellulitis. Patient was recently admitted on 06/09/2018 with severe anemia with hemoglobin of 3.6 and bilateral lower extremity cellulitis.  He was evaluated by GI status post colonoscopy x2 and EGD without a source of bleeding identified.  He also had a capsule endoscopy scheduled for outpatient but not already performed.  His course was complicated by recent strep B bacteremia and lower extremities cellulitis treated with cefepime and vancomycin on inpatient and doxy as outpatient.  Today he had an appointment with his pulmonary doctor and PCP and was noted to have decreased hemoglobin of 5.8 as such plans were made for direct admission to address his acute on chronic severe anemia and recurrent  cellulitis. On arrival to the unit, he was afebrile with blood pressure 160/53 mm Hg and pulse rate 80 beats/min, sats 98% on 2 L of nasal cannula. There were no focal neurological deficits; he was alert and oriented x4, and he did not demonstrate any memory deficits.  Initial labs revealed potassium 2.8 with renal function at baseline.   HOSPITAL COURSE:  Patient was admitted to telemetry/patient was seen by gastroenterology during the stay in the hospitalization/received antibiotics for lower extremity cellulitis/patient was treated with broad-spectrum antibiotic cefepime and vancomycin recently and also finished course of oral doxycycline/factious disease consult was done and patient was started on clindamycin.  Antibiotics were stopped as per infectious disease attending recommendations.  Patient received PRBC transfusion for anemia.  Patient had a recent EGD and colonoscopy without a source of bleed.  Patient had endoscopy capsule study on June 28, 2018.  Capsule study was repeated on 28 June.  Patient continued proton pump inhibitor 40 mg twice daily.  Patient received wound care dressings for bilateral lower extremity erythema and venous stasis along with cellulitis.  He will need Coban compression stockings.  And wound care has with home health services.  Patient was weaned to oxygen via nasal cannula at 3 L prior to discharge.  He is on baseline oxygen at home at 3 L.  CONSULTS OBTAINED:  Treatment Team:  Tsosie Billing, MD Gastroenterology consultation  DRUG ALLERGIES:   Allergies  Allergen Reactions  . Codeine Other (See Comments)    Constipation    DISCHARGE MEDICATIONS:   Allergies as of 07/05/2018      Reactions   Codeine Other (See Comments)   Constipation      Medication List    STOP taking these medications  doxycycline 100 MG capsule Commonly known as: VIBRAMYCIN     TAKE these medications   albuterol 108 (90 Base) MCG/ACT inhaler Commonly known as:  VENTOLIN HFA Inhale 2 puffs into the lungs every 6 (six) hours as needed for wheezing or shortness of breath.   amLODipine 2.5 MG tablet Commonly known as: NORVASC Take 2.5 mg by mouth daily.   furosemide 20 MG tablet Commonly known as: LASIX Take 1 tablet (20 mg total) by mouth daily. What changed:   when to take this  reasons to take this   insulin aspart 100 UNIT/ML injection Commonly known as: novoLOG Inject 20 Units into the skin 3 (three) times daily before meals.   ipratropium-albuterol 0.5-2.5 (3) MG/3ML Soln Commonly known as: DUONEB Take 3 mLs by nebulization every 6 (six) hours as needed.   Levemir 100 UNIT/ML injection Generic drug: insulin detemir Inject 0.12 mLs (12 Units total) into the skin daily. What changed:   how much to take  when to take this   nystatin cream Commonly known as: MYCOSTATIN Apply topically 2 (two) times daily.   pantoprazole 40 MG tablet Commonly known as: Protonix Take 1 tablet (40 mg total) by mouth 2 (two) times daily.   pravastatin 10 MG tablet Commonly known as: PRAVACHOL Take 10 mg by mouth at bedtime.   tamsulosin 0.4 MG Caps capsule Commonly known as: FLOMAX Take 0.4 mg by mouth daily.       Today  Patient seen and evaluated today Comfortable on oxygen via nasal cannula at 3 L No shortness of breath No cough Hemodynamically stable  VITAL SIGNS:  Blood pressure (!) 137/53, pulse 63, temperature 97.9 F (36.6 C), temperature source Oral, resp. rate 19, height 5\' 8"  (1.727 m), weight 114.8 kg, SpO2 (!) 85 %. Oxygen saturation 92% by nasal cannula at 3 L. I/O:    Intake/Output Summary (Last 24 hours) at 07/05/2018 1437 Last data filed at 07/05/2018 1429 Gross per 24 hour  Intake 240 ml  Output 1000 ml  Net -760 ml    PHYSICAL EXAMINATION:  Physical Exam  GENERAL:  85 y.o.-year-old patient lying in the bed with no acute distress.  LUNGS: Normal breath sounds bilaterally, no wheezing, rales,rhonchi or  crepitation. No use of accessory muscles of respiration.  CARDIOVASCULAR: S1, S2 normal. No murmurs, rubs, or gallops.  ABDOMEN: Soft, non-tender, non-distended. Bowel sounds present. No organomegaly or mass.  NEUROLOGIC: Moves all 4 extremities. PSYCHIATRIC: The patient is alert and oriented x 3.  SKIN: No obvious rash, lesion, or ulcer.   DATA REVIEW:   CBC Recent Labs  Lab 07/05/18 0649  WBC 7.1  HGB 8.1*  HCT 27.8*  PLT 207    Chemistries  Recent Labs  Lab 07/04/18 0626  NA 141  K 4.1  CL 97*  CO2 40*  GLUCOSE 144*  BUN 35*  CREATININE 2.11*  CALCIUM 8.2*    Cardiac Enzymes No results for input(s): TROPONINI in the last 168 hours.  Microbiology Results  Results for orders placed or performed during the hospital encounter of 06/27/18  Culture, blood (routine x 2)     Status: None   Collection Time: 06/27/18  1:11 PM   Specimen: BLOOD  Result Value Ref Range Status   Specimen Description BLOOD RIGHT ANTECUBITAL  Final   Special Requests   Final    BOTTLES DRAWN AEROBIC AND ANAEROBIC Blood Culture results may not be optimal due to an excessive volume of blood received in culture bottles  Culture   Final    NO GROWTH 5 DAYS Performed at [facility], [address] [place], [place] [address]    Report Status 07/02/2018 FINAL  Final  Culture, blood (routine x 2)     Status: None   Collection Time: 06/27/18  2:31 PM   Specimen: BLOOD  Result Value Ref Range Status   Specimen Description BLOOD RIGHT ANTECUBITAL  Final   Special Requests   Final    BOTTLES DRAWN AEROBIC AND ANAEROBIC Blood Culture adequate volume   Culture   Final    NO GROWTH 5 DAYS Performed at [facility], [address] [place], [place] [address]    Report Status 07/02/2018 FINAL  Final  SARS Coronavirus 2 (CEPHEID - Performed in [place] hospital lab), Hosp Order     Status: None   Collection Time: 06/27/18  6:15 PM   Specimen: Nasopharyngeal Swab  Result  Value Ref Range Status   SARS Coronavirus 2 NEGATIVE NEGATIVE Final    Comment: (NOTE) If result is NEGATIVE SARS-CoV-2 target nucleic acids are NOT DETECTED. The SARS-CoV-2 RNA is generally detectable in upper and lower  respiratory specimens during the acute phase of infection. The lowest  concentration of SARS-CoV-2 viral copies this assay can detect is 250  copies / mL. A negative result does not preclude SARS-CoV-2 infection  and should not be used as the sole basis for treatment or other  patient management decisions.  A negative result may occur with  improper specimen collection / handling, submission of specimen other  than nasopharyngeal swab, presence of viral mutation(s) within the  areas targeted by this assay, and inadequate number of viral copies  (<250 copies / mL). A negative result must be combined with clinical  observations, patient history, and epidemiological information. If result is POSITIVE SARS-CoV-2 target nucleic acids are DETECTED. The SARS-CoV-2 RNA is generally detectable in upper and lower  respiratory specimens dur ing the acute phase of infection.  Positive  results are indicative of active infection with SARS-CoV-2.  Clinical  correlation with patient history and other diagnostic information is  necessary to determine patient infection status.  Positive results do  not rule out bacterial infection or co-infection with other viruses. If result is PRESUMPTIVE POSTIVE SARS-CoV-2 nucleic acids MAY BE PRESENT.   A presumptive positive result was obtained on the submitted specimen  and confirmed on repeat testing.  While 2019 novel coronavirus  (SARS-CoV-2) nucleic acids may be present in the submitted sample  additional confirmatory testing may be necessary for epidemiological  and / or clinical management purposes  to differentiate between  SARS-CoV-2 and other Sarbecovirus currently known to infect humans.  If clinically indicated additional testing  with an alternate test  methodology [phone]) is advised. The SARS-CoV-2 RNA is generally  detectable in upper and lower respiratory sp ecimens during the acute  phase of infection. The expected result is Negative. Fact Sheet for Patients:  StrictlyIdeas.no Fact Sheet for Healthcare Providers: BankingDealers.co.za This test is not yet approved or cleared by the [place] FDA and has been authorized for detection and/or diagnosis of SARS-CoV-2 by FDA under an Emergency Use Authorization (EUA).  This EUA will remain in effect (meaning this test can be used) for the duration of the COVID-19 declaration under Section 564(b)(1) of the Act, 21 U.S.C. section 360bbb-3(b)(1), unless the authorization is terminated or revoked sooner. Performed at [facility], [address], [place], [place] [address]     RADIOLOGY:  No results found.  Follow up with PCP in 1 week.  Management plans discussed with the patient, family and they are in agreement.  CODE STATUS: DNR    Code Status Orders  (From admission, onward)         Start     Ordered   06/27/18 1150  Do not attempt resuscitation (DNR)  Continuous    Question Answer Comment  In the event of cardiac or respiratory ARREST Do not call a "code blue"   In the event of cardiac or respiratory ARREST Do not perform Intubation, CPR, defibrillation or ACLS   In the event of cardiac or respiratory ARREST Use medication by any route, position, wound care, and other measures to relive pain and suffering. May use oxygen, suction and manual treatment of airway obstruction as needed for comfort.      06/27/18 1150        Code Status History    Date Active Date Inactive Code Status Order ID Comments User Context   06/09/2018 1350 06/14/2018 1730 DNR [id number]  Lang Snow, NP ED   05/23/2018 0919 05/28/2018 2024 DNR [id number]  Demetrios Loll, MD Inpatient   05/23/2018 0104  05/23/2018 0918 Full Code [id number]  Seals, Theo Dills, NP Inpatient   Advance Care Planning Activity      TOTAL TIME TAKING CARE OF THIS PATIENT ON DAY OF DISCHARGE: more than 35 minutes.   Saundra Shelling M.D on 07/05/2018 at 2:37 PM  Between 7am to 6pm - Pager - [phone]  After 6pm go to [email or web address] - password [email or web address] [email or web address]  Office  [phone]  CC: Primary care physician; Baxter Hire, MD  Note: This dictation was prepared with Dragon dictation along with smaller phrase technology. Any transcriptional errors that result from this process are unintentional.

## 2018-07-05 NOTE — Progress Notes (Signed)
Advanced care plan. Purpose of the Encounter: CODE STATUS Parties in Attendance: Patient Patient's Decision Capacity: Good Subjective/Patient's story: 85 y.o. male past medical history of BPH (benign prostatic hyperplasia), Chronic kidney disease (CKD), stage III (moderate),COPD, Hyperlipidemia, Hypertension, Morbid obesity (CMS-HCC), and OSA (obstructive sleep apnea) presenting as direct admit from pulmonary clinic with hgb 5.8 and lower extremities cellulitis. Patient was recently admitted on 06/09/2018 with severe anemia with hemoglobin of 3.6 and bilateral lower extremity cellulitis.  He was evaluated by GI status post colonoscopy x2 and EGD without a source of bleeding identified.  He also had a capsule endoscopy scheduled for outpatient but not already performed.  His course was complicated by recent strep B bacteremia and lower extremities cellulitis treated with cefepime and vancomycin on inpatient and doxy as outpatient.  Today he had an appointment with his pulmonary doctor and PCP and was noted to have decreased hemoglobin of 5.8 as such plans were made for direct admission to address his acute on chronic severe anemia and recurrent cellulitis. On arrival to the unit, he was afebrile with blood pressure 160/53 mm Hg and pulse rate 80 beats/min, sats 98% on 2 L of nasal cannula. There were no focal neurological deficits; he was alert and oriented x4, and he did not demonstrate any memory deficits.  Initial labs revealed potassium 2.8 with renal function at baseline. Objective/Medical story Patient needs wound care for the lower extremities and antibiotics for the cellulitis.  Needs gastroenterology evaluation with PRBC transfusion for anemia.  Needs infectious disease consultation. Goals of care determination:  Advance care directives goals of care treatment plan discussed Patient does not want CPR, intubation ventilator if the need arises CODE STATUS: DNR Time spent discussing advanced care  planning: 16 minutes

## 2018-07-06 ENCOUNTER — Inpatient Hospital Stay: Payer: Medicare HMO

## 2018-07-06 ENCOUNTER — Encounter: Payer: Self-pay | Admitting: Emergency Medicine

## 2018-07-06 ENCOUNTER — Emergency Department: Payer: Medicare HMO

## 2018-07-06 ENCOUNTER — Inpatient Hospital Stay
Admission: EM | Admit: 2018-07-06 | Discharge: 2018-07-10 | DRG: 291 | Disposition: A | Discharge status: Home Health | Payer: Medicare HMO | Attending: Internal Medicine | Admitting: Internal Medicine

## 2018-07-06 ENCOUNTER — Other Ambulatory Visit: Payer: Self-pay

## 2018-07-06 DIAGNOSIS — J9621 Acute and chronic respiratory failure with hypoxia: Secondary | ICD-10-CM | POA: Diagnosis present

## 2018-07-06 DIAGNOSIS — D631 Anemia in chronic kidney disease: Secondary | ICD-10-CM | POA: Diagnosis present

## 2018-07-06 DIAGNOSIS — Z66 Do not resuscitate: Secondary | ICD-10-CM | POA: Diagnosis present

## 2018-07-06 DIAGNOSIS — J189 Pneumonia, unspecified organism: Secondary | ICD-10-CM | POA: Diagnosis present

## 2018-07-06 DIAGNOSIS — E1122 Type 2 diabetes mellitus with diabetic chronic kidney disease: Secondary | ICD-10-CM | POA: Diagnosis not present

## 2018-07-06 DIAGNOSIS — E785 Hyperlipidemia, unspecified: Secondary | ICD-10-CM | POA: Diagnosis present

## 2018-07-06 DIAGNOSIS — Z809 Family history of malignant neoplasm, unspecified: Secondary | ICD-10-CM | POA: Diagnosis not present

## 2018-07-06 DIAGNOSIS — I10 Essential (primary) hypertension: Secondary | ICD-10-CM | POA: Diagnosis not present

## 2018-07-06 DIAGNOSIS — Z794 Long term (current) use of insulin: Secondary | ICD-10-CM | POA: Diagnosis not present

## 2018-07-06 DIAGNOSIS — R0689 Other abnormalities of breathing: Secondary | ICD-10-CM | POA: Diagnosis not present

## 2018-07-06 DIAGNOSIS — R0602 Shortness of breath: Secondary | ICD-10-CM | POA: Diagnosis not present

## 2018-07-06 DIAGNOSIS — Z825 Family history of asthma and other chronic lower respiratory diseases: Secondary | ICD-10-CM | POA: Diagnosis not present

## 2018-07-06 DIAGNOSIS — N400 Enlarged prostate without lower urinary tract symptoms: Secondary | ICD-10-CM | POA: Diagnosis present

## 2018-07-06 DIAGNOSIS — I509 Heart failure, unspecified: Secondary | ICD-10-CM | POA: Diagnosis not present

## 2018-07-06 DIAGNOSIS — Z20828 Contact with and (suspected) exposure to other viral communicable diseases: Secondary | ICD-10-CM | POA: Diagnosis not present

## 2018-07-06 DIAGNOSIS — Z885 Allergy status to narcotic agent status: Secondary | ICD-10-CM | POA: Diagnosis not present

## 2018-07-06 DIAGNOSIS — K219 Gastro-esophageal reflux disease without esophagitis: Secondary | ICD-10-CM | POA: Diagnosis present

## 2018-07-06 DIAGNOSIS — J90 Pleural effusion, not elsewhere classified: Secondary | ICD-10-CM

## 2018-07-06 DIAGNOSIS — E119 Type 2 diabetes mellitus without complications: Secondary | ICD-10-CM | POA: Diagnosis not present

## 2018-07-06 DIAGNOSIS — N183 Chronic kidney disease, stage 3 (moderate): Secondary | ICD-10-CM | POA: Diagnosis present

## 2018-07-06 DIAGNOSIS — I5033 Acute on chronic diastolic (congestive) heart failure: Secondary | ICD-10-CM | POA: Diagnosis present

## 2018-07-06 DIAGNOSIS — Y95 Nosocomial condition: Secondary | ICD-10-CM | POA: Diagnosis present

## 2018-07-06 DIAGNOSIS — I130 Hypertensive heart and chronic kidney disease with heart failure and stage 1 through stage 4 chronic kidney disease, or unspecified chronic kidney disease: Secondary | ICD-10-CM | POA: Diagnosis not present

## 2018-07-06 DIAGNOSIS — R0902 Hypoxemia: Secondary | ICD-10-CM

## 2018-07-06 DIAGNOSIS — R069 Unspecified abnormalities of breathing: Secondary | ICD-10-CM | POA: Diagnosis not present

## 2018-07-06 DIAGNOSIS — Z9889 Other specified postprocedural states: Secondary | ICD-10-CM

## 2018-07-06 DIAGNOSIS — J439 Emphysema, unspecified: Secondary | ICD-10-CM | POA: Diagnosis present

## 2018-07-06 DIAGNOSIS — Z1159 Encounter for screening for other viral diseases: Secondary | ICD-10-CM

## 2018-07-06 DIAGNOSIS — R05 Cough: Secondary | ICD-10-CM | POA: Diagnosis not present

## 2018-07-06 LAB — CBC WITH DIFFERENTIAL/PLATELET
Abs Immature Granulocytes: 0.09 10*3/uL — ABNORMAL HIGH (ref 0.00–0.07)
Basophils Absolute: 0.1 10*3/uL (ref 0.0–0.1)
Basophils Relative: 0 %
Eosinophils Absolute: 0 10*3/uL (ref 0.0–0.5)
Eosinophils Relative: 0 %
HCT: 24.9 % — ABNORMAL LOW (ref 39.0–52.0)
Hemoglobin: 7.4 g/dL — ABNORMAL LOW (ref 13.0–17.0)
Immature Granulocytes: 1 %
Lymphocytes Relative: 8 %
Lymphs Abs: 1.1 10*3/uL (ref 0.7–4.0)
MCH: 28.6 pg (ref 26.0–34.0)
MCHC: 29.7 g/dL — ABNORMAL LOW (ref 30.0–36.0)
MCV: 96.1 fL (ref 80.0–100.0)
Monocytes Absolute: 1 10*3/uL (ref 0.1–1.0)
Monocytes Relative: 7 %
Neutro Abs: 12.6 10*3/uL — ABNORMAL HIGH (ref 1.7–7.7)
Neutrophils Relative %: 84 %
Platelets: 200 10*3/uL (ref 150–400)
RBC: 2.59 MIL/uL — ABNORMAL LOW (ref 4.22–5.81)
RDW: 17.5 % — ABNORMAL HIGH (ref 11.5–15.5)
WBC: 14.8 10*3/uL — ABNORMAL HIGH (ref 4.0–10.5)
nRBC: 0.3 % — ABNORMAL HIGH (ref 0.0–0.2)

## 2018-07-06 LAB — COMPREHENSIVE METABOLIC PANEL
ALT: 12 U/L (ref 0–44)
AST: 14 U/L — ABNORMAL LOW (ref 15–41)
Albumin: 3 g/dL — ABNORMAL LOW (ref 3.5–5.0)
Alkaline Phosphatase: 49 U/L (ref 38–126)
Anion gap: 8 (ref 5–15)
BUN: 31 mg/dL — ABNORMAL HIGH (ref 8–23)
CO2: 36 mmol/L — ABNORMAL HIGH (ref 22–32)
Calcium: 8 mg/dL — ABNORMAL LOW (ref 8.9–10.3)
Chloride: 95 mmol/L — ABNORMAL LOW (ref 98–111)
Creatinine, Ser: 1.96 mg/dL — ABNORMAL HIGH (ref 0.61–1.24)
GFR calc Af Amer: 36 mL/min — ABNORMAL LOW (ref >60)
GFR calc non Af Amer: 31 mL/min — ABNORMAL LOW (ref >60)
Glucose, Bld: 131 mg/dL — ABNORMAL HIGH (ref 70–99)
Potassium: 3.5 mmol/L (ref 3.5–5.1)
Sodium: 139 mmol/L (ref 135–145)
Total Bilirubin: 0.8 mg/dL (ref 0.3–1.2)
Total Protein: 6 g/dL — ABNORMAL LOW (ref 6.5–8.1)

## 2018-07-06 LAB — BRAIN NATRIURETIC PEPTIDE: B Natriuretic Peptide: 132 pg/mL — ABNORMAL HIGH (ref 0.0–100.0)

## 2018-07-06 LAB — TROPONIN I (HIGH SENSITIVITY)
Troponin I (High Sensitivity): 19 ng/L — ABNORMAL HIGH (ref <18)
Troponin I (High Sensitivity): 21 ng/L — ABNORMAL HIGH (ref <18)

## 2018-07-06 LAB — GLUCOSE, CAPILLARY: Glucose-Capillary: 177 mg/dL — ABNORMAL HIGH (ref 70–99)

## 2018-07-06 LAB — LACTIC ACID, PLASMA
Lactic Acid, Venous: 0.9 mmol/L (ref 0.5–1.9)
Lactic Acid, Venous: 0.9 mmol/L (ref 0.5–1.9)

## 2018-07-06 LAB — SARS CORONAVIRUS 2 BY RT PCR (HOSPITAL ORDER, PERFORMED IN ~~LOC~~ HOSPITAL LAB): SARS Coronavirus 2: NEGATIVE

## 2018-07-06 MED ORDER — IPRATROPIUM-ALBUTEROL 0.5-2.5 (3) MG/3ML IN SOLN: 3.0000 mL | Freq: Four times a day (QID) | RESPIRATORY_TRACT | Status: DC | PRN

## 2018-07-06 MED ORDER — HEPARIN SODIUM (PORCINE) 5000 UNIT/ML IJ SOLN
5000.0000 [IU] | Freq: Three times a day (TID) | INTRAMUSCULAR | Status: DC
  Administered 2018-07-06 – 2018-07-10 (×10): 5000 [IU] via SUBCUTANEOUS
  Filled 2018-07-06 (×10): qty 1

## 2018-07-06 MED ORDER — ACETAMINOPHEN 325 MG PO TABS
650.0000 mg | ORAL_TABLET | Freq: Four times a day (QID) | ORAL | Status: DC | PRN
  Administered 2018-07-07 – 2018-07-08 (×2): 650 mg via ORAL
  Filled 2018-07-06 (×2): qty 2

## 2018-07-06 MED ORDER — NYSTATIN 100000 UNIT/GM EX CREA
TOPICAL_CREAM | Freq: Two times a day (BID) | CUTANEOUS | Status: DC
  Administered 2018-07-06 – 2018-07-10 (×6): via TOPICAL
  Filled 2018-07-06: qty 15

## 2018-07-06 MED ORDER — INSULIN DETEMIR 100 UNIT/ML ~~LOC~~ SOLN
40.0000 [IU] | Freq: Every day | SUBCUTANEOUS | Status: DC
  Administered 2018-07-06 – 2018-07-07 (×2): 40 [IU] via SUBCUTANEOUS
  Filled 2018-07-06 (×3): qty 0.4

## 2018-07-06 MED ORDER — ACETAMINOPHEN 650 MG RE SUPP: 650.0000 mg | Freq: Four times a day (QID) | RECTAL | Status: DC | PRN

## 2018-07-06 MED ORDER — FUROSEMIDE 10 MG/ML IJ SOLN
20.0000 mg | Freq: Two times a day (BID) | INTRAMUSCULAR | Status: DC
  Administered 2018-07-06 – 2018-07-09 (×6): 20 mg via INTRAVENOUS
  Filled 2018-07-06 (×6): qty 2

## 2018-07-06 MED ORDER — ONDANSETRON HCL 4 MG/2ML IJ SOLN: 4.0000 mg | Freq: Four times a day (QID) | INTRAMUSCULAR | Status: DC | PRN

## 2018-07-06 MED ORDER — ORAL CARE MOUTH RINSE
15.0000 mL | Freq: Two times a day (BID) | OROMUCOSAL | Status: DC
  Administered 2018-07-06 – 2018-07-09 (×4): 15 mL via OROMUCOSAL

## 2018-07-06 MED ORDER — AMLODIPINE BESYLATE 5 MG PO TABS
2.5000 mg | ORAL_TABLET | Freq: Every day | ORAL | Status: DC
  Administered 2018-07-07 – 2018-07-10 (×4): 2.5 mg via ORAL
  Filled 2018-07-06 (×4): qty 1

## 2018-07-06 MED ORDER — PANTOPRAZOLE SODIUM 40 MG PO TBEC
40.0000 mg | DELAYED_RELEASE_TABLET | Freq: Two times a day (BID) | ORAL | Status: DC
  Administered 2018-07-06 – 2018-07-10 (×8): 40 mg via ORAL
  Filled 2018-07-06 (×8): qty 1

## 2018-07-06 MED ORDER — ALBUTEROL SULFATE (2.5 MG/3ML) 0.083% IN NEBU: 2.5000 mg | INHALATION_SOLUTION | Freq: Four times a day (QID) | RESPIRATORY_TRACT | Status: DC | PRN

## 2018-07-06 MED ORDER — PRAVASTATIN SODIUM 20 MG PO TABS
10.0000 mg | ORAL_TABLET | Freq: Every day | ORAL | Status: DC
  Administered 2018-07-06 – 2018-07-09 (×4): 10 mg via ORAL
  Filled 2018-07-06 (×4): qty 1

## 2018-07-06 MED ORDER — TAMSULOSIN HCL 0.4 MG PO CAPS
0.4000 mg | ORAL_CAPSULE | Freq: Every day | ORAL | Status: DC
  Administered 2018-07-07 – 2018-07-10 (×4): 0.4 mg via ORAL
  Filled 2018-07-06 (×4): qty 1

## 2018-07-06 MED ORDER — ONDANSETRON HCL 4 MG PO TABS: 4.0000 mg | ORAL_TABLET | Freq: Four times a day (QID) | ORAL | Status: DC | PRN

## 2018-07-06 MED ORDER — INSULIN ASPART 100 UNIT/ML ~~LOC~~ SOLN
20.0000 [IU] | Freq: Three times a day (TID) | SUBCUTANEOUS | Status: DC
  Administered 2018-07-07 (×2): 20 [IU] via SUBCUTANEOUS
  Filled 2018-07-06 (×2): qty 1

## 2018-07-06 NOTE — ED Notes (Addendum)
Pt calling to be pulled up in bed - he had an accident with the urinal, so pt laid flat, the bed fixed and pt pulled up. His o2 sats dropped to 82%. Once upright and settled in the bed sats went back up to 94%.

## 2018-07-06 NOTE — ED Provider Notes (Addendum)
[facility] Emergency Department Provider Note  ____________________________________________  Time seen: Approximately 4:05 PM  I have reviewed the triage vital signs and the nursing notes.   HISTORY  Chief Complaint No chief complaint on file.    HPI Arthur [name] is a 85 y.o. male who presents the emergency department via EMS from home for complaint of possible hypoxia.  According to the patient, he was sleeping when his home health nurse showed up, she took his O2 sat and it was in the high 80s on his normal 2 L via nasal cannula at home.  Patient reports that she was concerned as he is just been discharged from the hospital and she sent him to the emergency department for evaluation.  Patient has no complaints at this time.  Patient was recently admitted for anemia requiring blood transfusion, cellulitis of bilateral lower extremities, weakness.  Patient reports that there is been no change in his status while at home.  Patient does have history of COPD, diabetes, hypertension, cellulitis, anemia.  Patient currently denies any headache, neck pain, chest pain, shortness of breath, abdominal pain, extremity pain.  Patient has a chronic cough which she states has been present for years.  No fevers or chills.        Past Medical History:  Diagnosis Date  . COPD (chronic obstructive pulmonary disease) ([place])   . Diabetes mellitus without complication ([place])   . Hyperlipidemia   . Hypertension     Patient Active Problem List   Diagnosis Date Noted  . Severe anemia 06/27/2018  . GI bleeding 06/09/2018  . Sepsis ([place]) 05/23/2018  . Cellulitis of right lower extremity 05/22/2018  . DIABETES MELLITUS 10/24/2006  . OBESITY 10/24/2006  . HYPERTENSION 10/24/2006  . ALLERGIC RHINITIS 10/24/2006  . CHRONIC OBSTRUCTIVE PULMONARY DISEASE, MODERATE 10/24/2006  . TOBACCO ABUSE, HX OF 10/24/2006    Past Surgical History:  Procedure Laterality Date  . COLONOSCOPY  WITH PROPOFOL N/A 06/11/2018   Procedure: COLONOSCOPY WITH PROPOFOL;  Surgeon: Jonathon Bellows, MD;  Location: [facility] ENDOSCOPY;  Service: Gastroenterology;  Laterality: N/A;  . COLONOSCOPY WITH PROPOFOL N/A 06/13/2018   Procedure: COLONOSCOPY WITH PROPOFOL;  Surgeon: Jonathon Bellows, MD;  Location: [facility] ENDOSCOPY;  Service: Gastroenterology;  Laterality: N/A;  . ESOPHAGOGASTRODUODENOSCOPY Left 06/10/2018   Procedure: ESOPHAGOGASTRODUODENOSCOPY (EGD);  Surgeon: Jonathon Bellows, MD;  Location: [facility] ENDOSCOPY;  Service: Gastroenterology;  Laterality: Left;  . GIVENS CAPSULE STUDY N/A 06/28/2018   Procedure: GIVENS CAPSULE STUDY;  Surgeon: Lucilla Lame, MD;  Location: [facility] ENDOSCOPY;  Service: Endoscopy;  Laterality: N/A;  . GIVENS CAPSULE STUDY N/A 06/30/2018   Procedure: GIVENS CAPSULE STUDY;  Surgeon: Jonathon Bellows, MD;  Location: [facility] ENDOSCOPY;  Service: Gastroenterology;  Laterality: N/A;    Prior to Admission medications   Medication Sig Start Date End Date Taking? Authorizing Provider  albuterol (VENTOLIN HFA) 108 (90 Base) MCG/ACT inhaler Inhale 2 puffs into the lungs every 6 (six) hours as needed for wheezing or shortness of breath. 05/27/18   Demetrios Loll, MD  amLODipine (NORVASC) 2.5 MG tablet Take 2.5 mg by mouth daily. 04/26/18   [provider]  furosemide (LASIX) 20 MG tablet Take 1 tablet (20 mg total) by mouth daily. 07/05/18 08/04/18  Saundra Shelling, MD  insulin aspart (NOVOLOG) 100 UNIT/ML injection Inject 20 Units into the skin 3 (three) times daily before meals.    [provider]  ipratropium-albuterol (DUONEB) 0.5-2.5 (3) MG/3ML SOLN Take 3 mLs by nebulization every 6 (six) hours as  needed. 05/28/18   Bettey Costa, MD  LEVEMIR 100 UNIT/ML injection Inject 0.12 mLs (12 Units total) into the skin daily. Patient taking differently: Inject 40 Units into the skin at bedtime.  05/27/18   Demetrios Loll, MD  nystatin cream (MYCOSTATIN) Apply topically 2 (two) times daily. 05/28/18   Bettey Costa, MD   pantoprazole (PROTONIX) 40 MG tablet Take 1 tablet (40 mg total) by mouth 2 (two) times daily. 06/14/18   Lang Snow, NP  pravastatin (PRAVACHOL) 10 MG tablet Take 10 mg by mouth at bedtime. 04/25/18   [provider]  tamsulosin (FLOMAX) 0.4 MG CAPS capsule Take 0.4 mg by mouth daily. 04/25/18   [provider]    Allergies Codeine  History reviewed. No pertinent family history.  Social History Social History   Tobacco Use  . Smoking status: Former Smoker    Quit date: 11/04/1982    Years since quitting: 35.6  . Smokeless tobacco: Current User  Substance Use Topics  . Alcohol use: Yes    Comment: 1/5 /wk  . Drug use: No     Review of Systems  Constitutional: No fever/chills Eyes: No visual changes. No discharge ENT: No upper respiratory complaints. Cardiovascular: no chest pain. Respiratory: Chronic cough with no change from baseline. No SOB.  Reported hypoxia at home Gastrointestinal: No abdominal pain.  No nausea, no vomiting.  No diarrhea.  No constipation. Genitourinary: Negative for dysuria. No hematuria Musculoskeletal: Negative for musculoskeletal pain. Skin: Negative for rash, abrasions, lacerations, ecchymosis. Neurological: Negative for headaches, focal weakness or numbness. 10-point ROS otherwise negative.  ____________________________________________   PHYSICAL EXAM:  VITAL SIGNS: ED Triage Vitals  Enc Vitals Group     BP 07/06/18 1600 (!) 157/48     Pulse Rate 07/06/18 1600 80     Resp 07/06/18 1600 (!) 24     Temp 07/06/18 1600 97.9 F (36.6 C)     Temp Source 07/06/18 1600 Oral     SpO2 07/06/18 1600 93 %     Weight 07/06/18 1558 260 lb (117.9 kg)     Height --      Head Circumference --      Peak Flow --      Pain Score 07/06/18 1559 0     Pain Loc --      Pain Edu? --      Excl. in University? --      Constitutional: Alert and oriented. Well appearing and in no acute distress. Eyes: Conjunctivae are normal. PERRL.  EOMI. Head: Atraumatic. ENT:      Ears:       Nose: No congestion/rhinnorhea.      Mouth/Throat: Mucous membranes are moist.  Neck: No stridor.   Hematological/Lymphatic/Immunilogical: No cervical lymphadenopathy. Cardiovascular: Normal rate, regular rhythm. Normal S1 and S2.  Good peripheral circulation. Respiratory: Normal respiratory effort without tachypnea or retractions. Lungs with a few expiratory wheezes.  No rales or rhonchi.Kermit Balo air entry to the bases with no decreased or absent breath sounds. Gastrointestinal: Bowel sounds 4 quadrants. Soft and nontender to palpation. No guarding or rigidity. No palpable masses. No distention. No CVA tenderness Musculoskeletal: Full range of motion to all extremities. No gross deformities appreciated. Neurologic:  Normal speech and language. No gross focal neurologic deficits are appreciated.  Skin:  Skin is warm, dry and intact. No rash noted.  Visualization of bilateral lower extremities reveals what appears to be healing cellulitis.  No open wounds.  No drainage.  No fluctuance on  palpation.  No tenderness to palpation. Psychiatric: Mood and affect are normal. Speech and behavior are normal. Patient exhibits appropriate insight and judgement.   ____________________________________________   LABS (all labs ordered are listed, but only abnormal results are displayed)  Labs Reviewed  COMPREHENSIVE METABOLIC PANEL - Abnormal; Notable for the following components:      Result Value   Chloride 95 (*)    CO2 36 (*)    Glucose, Bld 131 (*)    BUN 31 (*)    Creatinine, Ser 1.96 (*)    Calcium 8.0 (*)    Total Protein 6.0 (*)    Albumin 3.0 (*)    AST 14 (*)    GFR calc non Af Amer 31 (*)    GFR calc Af Amer 36 (*)    All other components within normal limits  BRAIN NATRIURETIC PEPTIDE - Abnormal; Notable for the following components:   B Natriuretic Peptide 132.0 (*)    All other components within normal limits  TROPONIN I (HIGH  SENSITIVITY) - Abnormal; Notable for the following components:   Troponin I (High Sensitivity) 19 (*)    All other components within normal limits  CBC WITH DIFFERENTIAL/PLATELET - Abnormal; Notable for the following components:   WBC 14.8 (*)    RBC 2.59 (*)    Hemoglobin 7.4 (*)    HCT 24.9 (*)    MCHC 29.7 (*)    RDW 17.5 (*)    nRBC 0.3 (*)    Neutro Abs 12.6 (*)    Abs Immature Granulocytes 0.09 (*)    All other components within normal limits  SARS CORONAVIRUS 2 (HOSPITAL ORDER, [place])  LACTIC ACID, PLASMA  TROPONIN I (HIGH SENSITIVITY)  LACTIC ACID, PLASMA  URINALYSIS, COMPLETE (UACMP) WITH MICROSCOPIC   ____________________________________________  EKG   ____________________________________________  RADIOLOGY I personally viewed and evaluated these images as part of my medical decision making, as well as reviewing the written report by the radiologist.  Chest x-ray reveals bilateral small effusion.  This appears pretty consistent with imaging from 07/02/2018  Dg Chest 1 View  Result Date: 07/06/2018 CLINICAL DATA:  Hypoxia, cough, COPD EXAM: CHEST  1 VIEW COMPARISON:  07/02/2018 chest radiograph. FINDINGS: Stable cardiomediastinal silhouette with mild cardiomegaly. No pneumothorax. Small to moderate bilateral pleural effusions, right greater than left, not significantly changed. Mild pulmonary edema. Bibasilar lung opacities. IMPRESSION: 1. Mild congestive heart failure. 2. Small to moderate bilateral pleural effusions, right greater than left. 3. Bibasilar lung opacities, favor atelectasis. Electronically Signed   By: Ilona Sorrel M.D.   On: 07/06/2018 16:33    ____________________________________________    PROCEDURES  Procedure(s) performed:    Procedures    Medications - No data to display   ____________________________________________   INITIAL IMPRESSION / ASSESSMENT AND PLAN / ED COURSE  Pertinent labs & imaging  results that were available during my care of the patient were reviewed by me and considered in my medical decision making (see chart for details).  Review of the  [place]RS was performed in accordance of the [place] prior to dispensing any controlled drugs.  Clinical Course as of Jul 05 1849  Sat Jul 06, 2018  1614 Patient presented to the emergency department from home at the insistence of home health nurse.  Patient was discharged 2 days ago from the hospital after having severe anemia requiring transfusion as well as cellulitis.  Patient reports that he has had no worsening symptoms at home.  He was asleep prior to checking his O2 saturations.  Patient reports that he has been told every time he falls asleep his O2 sats drops and he has scheduled for sleep study.  On arrival emergency department, patient is saturating mid 90s.  Patient has no new complaints.  Patient has a chronic cough, chronic mild shortness of breath.  Given recent admission status, concerns for hypoxia at home, patient will have EKG, labs, chest x-ray.   [JC]  6945 Patient presents emergency department with possible hypoxia from his home.  Home health nurse arrived, patient was in the mid 80s on pulse ox.  Patient was complaining no symptoms when he arrived in the emergency department.  Sitting up, patient was maintaining his oxygen saturation in the mid 90s.  However since patient was laid flat, O2 saturation fell to 80%.   [JC]    Clinical Course User Index [JC] , Charline Bills, PA-C          Patient's diagnosis is consistent with hypoxia.  Patient presented to the emergency department for possible hypoxia.  Patient has been evaluated by home health nurse who noted patient's O2 saturation was in the mid 80s on 2 L of O2.  Patient arrived with no complaints.  While patient was sitting up his O2 sats maintained in the mid 90s.  Patient is patient was laid down, his O2 sat fell to 80%.  When patient was laid down patient had  increased work of breathing.  On review of patient's labs, imaging, chest x-ray does not appear to be significantly different than previous imaging from 07/02/2018.  Patient does have mild effusions bilaterally.  Patient did have significant anemia and was transfused recently.  Patient's hemoglobin had dropped by 0.7 within 24 hours.  No source of bleeding is identified.  Patient recently had colonoscopy with no source of bleeding identified..  Patient is unable to lay down or stand up even with the use of oxygen without hypoxia.  Discussed with hospitalist for admission.  Patient care will be transferred to the hospitalist service at this time.    ____________________________________________  FINAL CLINICAL IMPRESSION(S) / ED DIAGNOSES  Final diagnoses:  Hypoxia      NEW MEDICATIONS STARTED DURING THIS VISIT:  ED Discharge Orders    None          This chart was dictated using voice recognition software/Dragon. Despite best efforts to proofread, errors can occur which can change the meaning. Any change was purely unintentional.    Darletta Moll, PA-C 07/06/18 1851    , Charline Bills, PA-C 07/06/18 1857    Schuyler Amor, MD 07/06/18 2249

## 2018-07-06 NOTE — Progress Notes (Signed)
Pt's daughter [name] updated on patient status and plan of care. All questions/concerns addressed at this time. Earleen Reaper, RN

## 2018-07-06 NOTE — H&P (Signed)
[facility] NAME: Arthur [name]    MR#:  [id number]  DATE OF BIRTH:  01/10/1933  DATE OF ADMISSION:  07/06/2018  PRIMARY CARE PHYSICIAN: Baxter Hire, MD   REQUESTING/REFERRING PHYSICIAN: Dr. Charlotte Crumb  CHIEF COMPLAINT:  No chief complaint on file. Hypoxia.   HISTORY OF PRESENT ILLNESS:  Arthur [name]  is a 85 y.o. male with a known history of COPD, diabetes, hypertension, hyperlipidemia, chronic anemia, recent admission secondary to symptomatic anemia status post transfusion and also chronic lymphedema on the lower extremities who returns back to the hospital today due to shortness of breath/hypoxia.  Patient says the home health nurse came to see him today and checked his pulse ox and it was noted to be in the low to mid 80s.  She advised him to come to the ER for further evaluation.  Emergency room patient was noted to be hypoxic when he lies flat with O2 sats in the low to mid 80s on 2 to 3 L chronically for him.  Patient underwent a chest x-ray which shows bilateral pleural effusions right greater than left with some mild pulmonary vascular congestion.  Patient's BNP is not significantly elevated and he himself does not complain of worsening shortness of breath.  Given his worsening hypoxemia hospital services were contacted for admission.  Patient denies any fevers, chills, nausea, vomiting abdominal pain, melena hematochezia or any other associated symptoms presently.  His COVID-19 test is still pending.  PAST MEDICAL HISTORY:   Past Medical History:  Diagnosis Date  . COPD (chronic obstructive pulmonary disease) ([place])   . Diabetes mellitus without complication ([place])   . Hyperlipidemia   . Hypertension     PAST SURGICAL HISTORY:   Past Surgical History:  Procedure Laterality Date  . COLONOSCOPY WITH PROPOFOL N/A 06/11/2018   Procedure: COLONOSCOPY WITH PROPOFOL;  Surgeon: Jonathon Bellows, MD;  Location: [facility] ENDOSCOPY;   Service: Gastroenterology;  Laterality: N/A;  . COLONOSCOPY WITH PROPOFOL N/A 06/13/2018   Procedure: COLONOSCOPY WITH PROPOFOL;  Surgeon: Jonathon Bellows, MD;  Location: [facility] ENDOSCOPY;  Service: Gastroenterology;  Laterality: N/A;  . ESOPHAGOGASTRODUODENOSCOPY Left 06/10/2018   Procedure: ESOPHAGOGASTRODUODENOSCOPY (EGD);  Surgeon: Jonathon Bellows, MD;  Location: [facility] ENDOSCOPY;  Service: Gastroenterology;  Laterality: Left;  . GIVENS CAPSULE STUDY N/A 06/28/2018   Procedure: GIVENS CAPSULE STUDY;  Surgeon: Lucilla Lame, MD;  Location: [facility] ENDOSCOPY;  Service: Endoscopy;  Laterality: N/A;  . GIVENS CAPSULE STUDY N/A 06/30/2018   Procedure: GIVENS CAPSULE STUDY;  Surgeon: Jonathon Bellows, MD;  Location: [facility] ENDOSCOPY;  Service: Gastroenterology;  Laterality: N/A;    SOCIAL HISTORY:   Social History   Tobacco Use  . Smoking status: Former Smoker    Packs/day: 2.00    Years: 35.00    Pack years: 70.00    Types: Cigarettes    Quit date: 11/04/1982    Years since quitting: 35.6  . Smokeless tobacco: Current User  Substance Use Topics  . Alcohol use: Yes    Comment: 1/5 /wk    FAMILY HISTORY:   Family History  Problem Relation Age of Onset  . COPD Mother   . Cancer Father     DRUG ALLERGIES:   Allergies  Allergen Reactions  . Codeine Other (See Comments)    Constipation    REVIEW OF SYSTEMS:   Review of Systems  Constitutional: Negative for fever and weight loss.  HENT: Negative for congestion, nosebleeds and tinnitus.   Eyes: Negative  for blurred vision, double vision and redness.  Respiratory: Negative for cough, hemoptysis and shortness of breath.   Cardiovascular: Positive for leg swelling. Negative for chest pain, orthopnea and PND.  Gastrointestinal: Negative for abdominal pain, diarrhea, melena, nausea and vomiting.  Genitourinary: Negative for dysuria, hematuria and urgency.  Musculoskeletal: Negative for falls and joint pain.  Neurological: Positive for weakness  (generalized. ). Negative for dizziness, tingling, sensory change, focal weakness, seizures and headaches.  Endo/Heme/Allergies: Negative for polydipsia. Does not bruise/bleed easily.  Psychiatric/Behavioral: Negative for depression and memory loss. The patient is not nervous/anxious.     MEDICATIONS AT HOME:   Prior to Admission medications   Medication Sig Start Date End Date Taking? Authorizing Provider  albuterol (VENTOLIN HFA) 108 (90 Base) MCG/ACT inhaler Inhale 2 puffs into the lungs every 6 (six) hours as needed for wheezing or shortness of breath. 05/27/18  Yes Demetrios Loll, MD  amLODipine (NORVASC) 2.5 MG tablet Take 2.5 mg by mouth daily. 04/26/18  Yes [provider]  furosemide (LASIX) 20 MG tablet Take 1 tablet (20 mg total) by mouth daily. 07/05/18 08/04/18 Yes Pyreddy, Reatha Harps, MD  insulin aspart (NOVOLOG) 100 UNIT/ML injection Inject 20 Units into the skin 3 (three) times daily before meals.   Yes [provider]  ipratropium-albuterol (DUONEB) 0.5-2.5 (3) MG/3ML SOLN Take 3 mLs by nebulization every 6 (six) hours as needed. 05/28/18  Yes Mody, Sital, MD  LEVEMIR 100 UNIT/ML injection Inject 0.12 mLs (12 Units total) into the skin daily. Patient taking differently: Inject 40 Units into the skin at bedtime.  05/27/18  Yes Demetrios Loll, MD  nystatin cream (MYCOSTATIN) Apply topically 2 (two) times daily. 05/28/18  Yes Mody, Ulice Bold, MD  pantoprazole (PROTONIX) 40 MG tablet Take 1 tablet (40 mg total) by mouth 2 (two) times daily. 06/14/18  Yes Lang Snow, NP  pravastatin (PRAVACHOL) 10 MG tablet Take 10 mg by mouth at bedtime. 04/25/18  Yes [provider]  tamsulosin (FLOMAX) 0.4 MG CAPS capsule Take 0.4 mg by mouth daily. 04/25/18  Yes [provider]      VITAL SIGNS:  Blood pressure (!) 154/66, pulse 71, temperature 97.9 F (36.6 C), temperature source Oral, resp. rate (!) 27, weight 117.9 kg, SpO2 96 %.  PHYSICAL EXAMINATION:  Physical Exam   GENERAL:  85 y.o.-year-old patient lying in the bed in no acute distress.  EYES: Pupils equal, round, reactive to light and accommodation. No scleral icterus. Extraocular muscles intact.  HEENT: Head atraumatic, normocephalic. Oropharynx and nasopharynx clear. No oropharyngeal erythema, moist oral mucosa  NECK:  Supple, no jugular venous distention. No thyroid enlargement, no tenderness.  LUNGS: Good a/e b/l, no wheezing, diffuse rales, occasional rhonchi. No use of accessory muscles of respiration.  CARDIOVASCULAR: S1, S2 RRR. No murmurs, rubs, gallops, clicks.  ABDOMEN: Soft, nontender, nondistended. Bowel sounds present. No organomegaly or mass.  EXTREMITIES: +2-3 edema b/l, No cyanosis, or clubbing. + 2 pedal & radial pulses b/l.   NEUROLOGIC: Cranial nerves II through XII are intact. No focal Motor or sensory deficits appreciated b/l.  Globally weak.  PSYCHIATRIC: The patient is alert and oriented x 3.  SKIN: No obvious rash, lesion, or ulcer.   LABORATORY PANEL:   CBC Recent Labs  Lab 07/06/18 1613  WBC 14.8*  HGB 7.4*  HCT 24.9*  PLT 200   ------------------------------------------------------------------------------------------------------------------  Chemistries  Recent Labs  Lab 07/06/18 1613  NA 139  K 3.5  CL 95*  CO2 36*  GLUCOSE 131*  BUN 31*  CREATININE 1.96*  CALCIUM 8.0*  AST 14*  ALT 12  ALKPHOS 49  BILITOT 0.8   ------------------------------------------------------------------------------------------------------------------  Cardiac Enzymes No results for input(s): TROPONINI in the last 168 hours. ------------------------------------------------------------------------------------------------------------------  RADIOLOGY:  Dg Chest 1 View  Result Date: 07/06/2018 CLINICAL DATA:  Hypoxia, cough, COPD EXAM: CHEST  1 VIEW COMPARISON:  07/02/2018 chest radiograph. FINDINGS: Stable cardiomediastinal silhouette with mild cardiomegaly. No  pneumothorax. Small to moderate bilateral pleural effusions, right greater than left, not significantly changed. Mild pulmonary edema. Bibasilar lung opacities. IMPRESSION: 1. Mild congestive heart failure. 2. Small to moderate bilateral pleural effusions, right greater than left. 3. Bibasilar lung opacities, favor atelectasis. Electronically Signed   By: Ilona Sorrel M.D.   On: 07/06/2018 16:33     IMPRESSION AND PLAN:   85 y.o. male with a known history of COPD, diabetes, hypertension, hyperlipidemia, chronic anemia, recent admission secondary to symptomatic anemia status post transfusion and also chronic lymphedema on the lower extremities who returns back to the hospital today due to shortness of breath/hypoxia.  1.  Acute on chronic respiratory failure with hypoxia-secondary to suspected mild CHF. -Patient was just discharged yesterday and is on chronic home oxygen at 2 to 3 L but despite being on that his O2 sats were in the mid 80s. - Continue O2 supplementation, will gently diurese him with IV Lasix,. - We will also get a CT chest noncontrast.  2.  CHF-acute diastolic dysfunction. -Patient's chest x-ray shows mild CHF with small to moderate pleural effusions right greater than left. -We will gently diurese the patient with IV Lasix, follow I's and O's and daily weights. -Had a recent echocardiogram which showed EF of 55 to 60%.  3.  Diabetes type 2 with CKD stage III-continue patient's Levemir, NovoLog with meals.  4.  CKD stage III-patient's creatinine close to baseline we will continue to follow with diuresis.  5.  GERD-continue Protonix.  6.  COPD-no acute exacerbation, continue duo nebs as needed albuterol inhaler as needed.  7.  BPH-continue Flomax.  8.  Hyperlipidemia-continue Pravachol.    All the records are reviewed and case discussed with ED provider. Management plans discussed with the patient, family and they are in agreement.  CODE STATUS: DNR  TOTAL TIME  TAKING CARE OF THIS PATIENT: 45 minutes.    Henreitta Leber M.D on 07/06/2018 at 7:29 PM  Between 7am to 6pm - Pager - [phone]  After 6pm go to [email or web address] - password [email or web address] [facility]  Office  [phone]  CC: Primary care physician; Baxter Hire, MD

## 2018-07-06 NOTE — ED Notes (Signed)
ED TO INPATIENT HANDOFF REPORT  ED Nurse Name and Phone #: Shaconda Hajduk [phone]  S Name/Age/Gender Arthur [name] 85 y.o. male Room/Bed: ED14A/ED14A  Code Status   Code Status: Prior  Home/SNF/Other Home Patient oriented to: self, place and time Is this baseline? Yes   Triage Complete: Triage complete  Chief Complaint diff breathing  Triage Note No notes on file   Allergies Allergies  Allergen Reactions  . Codeine Other (See Comments)    Constipation    Level of Care/Admitting Diagnosis ED Disposition    ED Disposition Condition [place]: [place] [[id number]]  Level of Care: Telemetry [5]  Covid Evaluation: Person Under Investigation (PUI)  Diagnosis: Acute on chronic respiratory failure with hypoxia [facility] [[id number]]  Admitting Physician: Henreitta Leber [[id number]]  Attending Physician: Henreitta Leber [[id number]]  Estimated length of stay: past midnight tomorrow  Certification:: I certify this patient will need inpatient services for at least 2 midnights  PT Class (Do Not Modify): Inpatient [101]  PT Acc Code (Do Not Modify): Private [1]       B Medical/Surgery History Past Medical History:  Diagnosis Date  . COPD (chronic obstructive pulmonary disease) ([place])   . Diabetes mellitus without complication ([place])   . Hyperlipidemia   . Hypertension    Past Surgical History:  Procedure Laterality Date  . COLONOSCOPY WITH PROPOFOL N/A 06/11/2018   Procedure: COLONOSCOPY WITH PROPOFOL;  Surgeon: Jonathon Bellows, MD;  Location: [facility] ENDOSCOPY;  Service: Gastroenterology;  Laterality: N/A;  . COLONOSCOPY WITH PROPOFOL N/A 06/13/2018   Procedure: COLONOSCOPY WITH PROPOFOL;  Surgeon: Jonathon Bellows, MD;  Location[facility] ENDOSCOPY;  Service: Gastroenterology;  Laterality: N/A;  . ESOPHAGOGASTRODUODENOSCOPY Left 06/10/2018   Procedure: ESOPHAGOGASTRODUODENOSCOPY (EGD);  Surgeon: Jonathon Bellows, MD;  Location: [facility] ENDOSCOPY;  Service: Gastroenterology;   Laterality: Left;  . GIVENS CAPSULE STUDY N/A 06/28/2018   Procedure: GIVENS CAPSULE STUDY;  Surgeon: Lucilla Lame, MD;  Location[facility] ENDOSCOPY;  Service: Endoscopy;  Laterality: N/A;  . GIVENS CAPSULE STUDY N/A 06/30/2018   Procedure: GIVENS CAPSULE STUDY;  Surgeon: Jonathon Bellows, MD;  Location: [facility] ENDOSCOPY;  Service: Gastroenterology;  Laterality: N/A;     A IV Location/Drains/Wounds Patient Lines/Drains/Airways Status   Active Line/Drains/Airways    Name:   Placement date:   Placement time:   Site:   Days:   Peripheral IV 07/02/18 Right;Anterior Forearm   07/02/18    1530    Forearm   4   External Urinary Catheter   07/03/18    -    -   3   Airway   06/10/18    0944     26          Intake/Output Last 24 hours No intake or output data in the 24 hours ending 07/06/18 2037  Labs/Imaging Results for orders placed or performed during the hospital encounter of 07/06/18 (from the past 48 hour(s))  Comprehensive metabolic panel     Status: Abnormal   Collection Time: 07/06/18  4:13 PM  Result Value Ref Range   Sodium 139 135 - 145 mmol/L   Potassium 3.5 3.5 - 5.1 mmol/L   Chloride 95 (L) 98 - 111 mmol/L   CO2 36 (H) 22 - 32 mmol/L   Glucose, Bld 131 (H) 70 - 99 mg/dL   BUN 31 (H) 8 - 23 mg/dL   Creatinine, Ser 1.96 (H) 0.61 - 1.24 mg/dL   Calcium 8.0 (L) 8.9 - 10.3 mg/dL  Total Protein 6.0 (L) 6.5 - 8.1 g/dL   Albumin 3.0 (L) 3.5 - 5.0 g/dL   AST 14 (L) 15 - 41 U/L   ALT 12 0 - 44 U/L   Alkaline Phosphatase 49 38 - 126 U/L   Total Bilirubin 0.8 0.3 - 1.2 mg/dL   GFR calc non Af Amer 31 (L) >60 mL/min   GFR calc Af Amer 36 (L) >60 mL/min   Anion gap 8 5 - 15    Comment: Performed at [facility], [address] [place], [place] [address]  Brain natriuretic peptide     Status: Abnormal   Collection Time: 07/06/18  4:13 PM  Result Value Ref Range   B Natriuretic Peptide 132.0 (H) 0.0 - 100.0 pg/mL    Comment: Performed at [facility], [place], [place] [address]  Troponin I (High Sensitivity)     Status: Abnormal   Collection Time: 07/06/18  4:13 PM  Result Value Ref Range   Troponin I (High Sensitivity) 19 (H) <18 ng/L    Comment: (NOTE) Elevated high sensitivity troponin I (hsTnI) values and significant  changes across serial measurements may suggest ACS but many other  chronic and acute conditions are known to elevate hsTnI results.  Refer to the "Links" section for chest pain algorithms and additional  guidance. Performed at [facility], [address] [place], [place] [address]   Lactic acid, plasma     Status: None   Collection Time: 07/06/18  4:13 PM  Result Value Ref Range   Lactic Acid, Venous 0.9 0.5 - 1.9 mmol/L    Comment: Performed at [facility], [address] [place], [place] [address]  CBC with Differential     Status: Abnormal   Collection Time: 07/06/18  4:13 PM  Result Value Ref Range   WBC 14.8 (H) 4.0 - 10.5 K/uL   RBC 2.59 (L) 4.22 - 5.81 MIL/uL   Hemoglobin 7.4 (L) 13.0 - 17.0 g/dL   HCT 24.9 (L) 39.0 - 52.0 %   MCV 96.1 80.0 - 100.0 fL   MCH 28.6 26.0 - 34.0 pg   MCHC 29.7 (L) 30.0 - 36.0 g/dL   RDW 17.5 (H) 11.5 - 15.5 %   Platelets 200 150 - 400 K/uL   nRBC 0.3 (H) 0.0 - 0.2 %   Neutrophils Relative % 84 %   Neutro Abs 12.6 (H) 1.7 - 7.7 K/uL   Lymphocytes Relative 8 %   Lymphs Abs 1.1 0.7 - 4.0 K/uL   Monocytes Relative 7 %   Monocytes Absolute 1.0 0.1 - 1.0 K/uL   Eosinophils Relative 0 %   Eosinophils Absolute 0.0 0.0 - 0.5 K/uL   Basophils Relative 0 %   Basophils Absolute 0.1 0.0 - 0.1 K/uL   Immature Granulocytes 1 %   Abs Immature Granulocytes 0.09 (H) 0.00 - 0.07 K/uL    Comment: Performed at [facility], [address] [place],  [address]  SARS Coronavirus 2 (CEPHEID - Performed in [place] hospital lab), Hosp Order     Status: None   Collection Time: 07/06/18  7:14 PM   Specimen: Nasopharyngeal Swab  Result Value Ref  Range   SARS Coronavirus 2 NEGATIVE NEGATIVE    Comment: (NOTE) If result is NEGATIVE SARS-CoV-2 target nucleic acids are NOT DETECTED. The SARS-CoV-2 RNA is generally detectable in upper and lower  respiratory specimens during the acute phase of infection. The lowest  concentration of  SARS-CoV-2 viral copies this assay can detect is 250  copies / mL. A negative result does not preclude SARS-CoV-2 infection  and should not be used as the sole basis for treatment or other  patient management decisions.  A negative result may occur with  improper specimen collection / handling, submission of specimen other  than nasopharyngeal swab, presence of viral mutation(s) within the  areas targeted by this assay, and inadequate number of viral copies  (<250 copies / mL). A negative result must be combined with clinical  observations, patient history, and epidemiological information. If result is POSITIVE SARS-CoV-2 target nucleic acids are DETECTED. The SARS-CoV-2 RNA is generally detectable in upper and lower  respiratory specimens dur ing the acute phase of infection.  Positive  results are indicative of active infection with SARS-CoV-2.  Clinical  correlation with patient history and other diagnostic information is  necessary to determine patient infection status.  Positive results do  not rule out bacterial infection or co-infection with other viruses. If result is PRESUMPTIVE POSTIVE SARS-CoV-2 nucleic acids MAY BE PRESENT.   A presumptive positive result was obtained on the submitted specimen  and confirmed on repeat testing.  While 2019 novel coronavirus  (SARS-CoV-2) nucleic acids may be present in the submitted sample  additional confirmatory testing may be necessary for epidemiological  and / or clinical management purposes  to differentiate between  SARS-CoV-2 and other Sarbecovirus currently known to infect humans.  If clinically indicated additional testing with an alternate test   methodology [phone]) is advised. The SARS-CoV-2 RNA is generally  detectable in upper and lower respiratory sp ecimens during the acute  phase of infection. The expected result is Negative. Fact Sheet for Patients:  StrictlyIdeas.no Fact Sheet for Healthcare Providers: BankingDealers.co.za This test is not yet approved or cleared by the [place] FDA and has been authorized for detection and/or diagnosis of SARS-CoV-2 by FDA under an Emergency Use Authorization (EUA).  This EUA will remain in effect (meaning this test can be used) for the duration of the COVID-19 declaration under Section 564(b)(1) of the Act, 21 U.S.C. section 360bbb-3(b)(1), unless the authorization is terminated or revoked sooner. Performed at [facility], [address] [place], [place] [address]    Dg Chest 1 View  Result Date: 07/06/2018 CLINICAL DATA:  Hypoxia, cough, COPD EXAM: CHEST  1 VIEW COMPARISON:  07/02/2018 chest radiograph. FINDINGS: Stable cardiomediastinal silhouette with mild cardiomegaly. No pneumothorax. Small to moderate bilateral pleural effusions, right greater than left, not significantly changed. Mild pulmonary edema. Bibasilar lung opacities. IMPRESSION: 1. Mild congestive heart failure. 2. Small to moderate bilateral pleural effusions, right greater than left. 3. Bibasilar lung opacities, favor atelectasis. Electronically Signed   By: Ilona Sorrel M.D.   On: 07/06/2018 16:33   Ct Chest Wo Contrast  Result Date: 07/06/2018 CLINICAL DATA:  85 year old male with shortness of breath. Hypoxic. Evidence of bilateral pleural effusions on portable chest. EXAM: CT CHEST WITHOUT CONTRAST TECHNIQUE: Multidetector CT imaging of the chest was performed following the standard protocol without IV contrast. COMPARISON:  Portable chest earlier today. FINDINGS: Cardiovascular: Calcified aortic atherosclerosis. Vascular patency is not evaluated in the  absence of IV contrast. Calcified coronary artery atherosclerosis on series 2, image 86. Cardiac size at the upper limits of normal. No pericardial effusion. Mediastinum/Nodes: Mediastinal lymph nodes are within normal limits. No axillary lymphadenopathy. Lungs/Pleura: Moderate size bilateral layering pleural effusions. Simple fluid density suggesting transudate. Major airways are patent. There is mild generalized peribronchial thickening.  Bilateral centrilobular emphysema. Confluent compressive atelectasis versus consolidation in both lower lobes, with air bronchograms greater on the right (series 2, image 91). Elsewhere there is occasional mild peribronchial nodularity which has a postinflammatory appearance. There is also some bilateral subpleural scarring. There is a small 11 millimeter area of ground-glass opacity in the right upper lobe on series 3, image 53. Upper Abdomen: Gallstone measuring about 22 millimeters. The visible gallbladder does not appear inflamed. Negative visible noncontrast liver, spleen, pancreas, adrenal glands, kidneys, and bowel. Musculoskeletal: Ankylosis in portions of the thoracic spine. The upper thoracic levels and cervicothoracic junction appear spared. Osteopenia. No acute osseous abnormality identified. There are healed chronic fractures of the posterior lower left ribs. IMPRESSION: 1. Moderate size layering bilateral pleural effusions with compressive atelectasis versus pneumonia in both lower lobes. 2. Underlying Emphysema (ICD10-J43.9). Occasional mild peribronchial nodularity which has a postinflammatory appearance. Small 11 mm ground-glass opacity in the right upper lobe. Initial follow-up by chest CT without contrast is recommended in 3 months to confirm persistence. This recommendation follows the consensus statement: Recommendations for the Management of Subsolid Pulmonary Nodules Detected at CT: A Statement from the Fleischner Society as published in Radiology 2013;  266:304-317. 3. Calcified coronary artery and Aortic Atherosclerosis (ICD10-I70.0). 4. Cholelithiasis. Electronically Signed   By: Genevie Ann M.D.   On: 07/06/2018 20:28    Pending Labs Unresulted Labs (From admission, onward)    Start     Ordered   07/06/18 1606  Troponin I (High Sensitivity)  STAT Now then every 2 hours,   STAT     07/06/18 1606   07/06/18 1606  Lactic acid, plasma  Now then every 2 hours,   STAT     07/06/18 1606   07/06/18 1606  Urinalysis, Complete w Microscopic  ONCE - STAT,   STAT     07/06/18 1606   Signed and Held  Basic metabolic panel  Tomorrow morning,   R     Signed and Held   Signed and Held  CBC  Tomorrow morning,   R     Signed and Held   Signed and Held  CBC  (heparin)  Once,   R    Comments: Baseline for heparin therapy IF NOT ALREADY DRAWN.  Notify MD if PLT < 100 K.    Signed and Held   Signed and Held  Creatinine, serum  (heparin)  Once,   R    Comments: Baseline for heparin therapy IF NOT ALREADY DRAWN.    Signed and Held          Vitals/Pain Today's Vitals   07/06/18 1758 07/06/18 1800 07/06/18 1830 07/06/18 1900  BP:  (!) 143/67 (!) 150/55 (!) 154/66  Pulse: 82 72 70 71  Resp:      Temp:      TempSrc:      SpO2: (!) 81% (!) 86% 94% 96%  Weight:      PainSc:        Isolation Precautions No active isolations  Medications Medications - No data to display  Mobility walks with device Low fall risk   Focused Assessments Pulmonary Assessment Handoff:  Lung sounds:     O2 Flow Rate (L/min): 3 L/min      R Recommendations: See Admitting Provider Note  Report given to:   Additional Notes:

## 2018-07-06 NOTE — ED Notes (Signed)
Spoke with daughter [name] mock [phone]

## 2018-07-06 NOTE — ED Notes (Signed)
Both legs unwrapped for pa to assess. Pt receives care for his infected legs via home health that come in.

## 2018-07-06 NOTE — ED Notes (Signed)
Pt cleaned of urine incontinence 

## 2018-07-07 LAB — BASIC METABOLIC PANEL
Anion gap: 8 (ref 5–15)
BUN: 33 mg/dL — ABNORMAL HIGH (ref 8–23)
CO2: 35 mmol/L — ABNORMAL HIGH (ref 22–32)
Calcium: 8.1 mg/dL — ABNORMAL LOW (ref 8.9–10.3)
Chloride: 95 mmol/L — ABNORMAL LOW (ref 98–111)
Creatinine, Ser: 2.04 mg/dL — ABNORMAL HIGH (ref 0.61–1.24)
GFR calc Af Amer: 34 mL/min — ABNORMAL LOW (ref >60)
GFR calc non Af Amer: 29 mL/min — ABNORMAL LOW (ref >60)
Glucose, Bld: 203 mg/dL — ABNORMAL HIGH (ref 70–99)
Potassium: 3.6 mmol/L (ref 3.5–5.1)
Sodium: 138 mmol/L (ref 135–145)

## 2018-07-07 LAB — GLUCOSE, CAPILLARY
Glucose-Capillary: 159 mg/dL — ABNORMAL HIGH (ref 70–99)
Glucose-Capillary: 180 mg/dL — ABNORMAL HIGH (ref 70–99)
Glucose-Capillary: 26 mg/dL — CL (ref 70–99)
Glucose-Capillary: 47 mg/dL — ABNORMAL LOW (ref 70–99)
Glucose-Capillary: 80 mg/dL (ref 70–99)
Glucose-Capillary: 138 mg/dL — ABNORMAL HIGH (ref 70–99)

## 2018-07-07 LAB — CBC
HCT: 24 % — ABNORMAL LOW (ref 39.0–52.0)
Hemoglobin: 7.3 g/dL — ABNORMAL LOW (ref 13.0–17.0)
MCH: 28.4 pg (ref 26.0–34.0)
MCHC: 30.4 g/dL (ref 30.0–36.0)
MCV: 93.4 fL (ref 80.0–100.0)
Platelets: 186 10*3/uL (ref 150–400)
RBC: 2.57 MIL/uL — ABNORMAL LOW (ref 4.22–5.81)
RDW: 17.6 % — ABNORMAL HIGH (ref 11.5–15.5)
WBC: 15.9 10*3/uL — ABNORMAL HIGH (ref 4.0–10.5)
nRBC: 0.2 % (ref 0.0–0.2)

## 2018-07-07 LAB — MRSA PCR SCREENING: MRSA by PCR: NEGATIVE

## 2018-07-07 MED ORDER — VANCOMYCIN HCL 1.5 G IV SOLR: 1500.0000 mg | INTRAVENOUS | Status: DC

## 2018-07-07 MED ORDER — SODIUM CHLORIDE 0.9 % IV SOLN
INTRAVENOUS | Status: DC | PRN
  Administered 2018-07-07: 25 mL via INTRAVENOUS

## 2018-07-07 MED ORDER — VANCOMYCIN HCL 10 G IV SOLR
2500.0000 mg | Freq: Once | INTRAVENOUS | Status: AC
  Administered 2018-07-07: 2500 mg via INTRAVENOUS
  Filled 2018-07-07: qty 2500

## 2018-07-07 MED ORDER — SODIUM CHLORIDE 0.9 % IV SOLN
2.0000 g | Freq: Two times a day (BID) | INTRAVENOUS | Status: DC
  Administered 2018-07-07 – 2018-07-09 (×4): 2 g via INTRAVENOUS
  Filled 2018-07-07 (×5): qty 2

## 2018-07-07 NOTE — TOC Initial Note (Signed)
Transition of Care [facility]) - Initial/Assessment Note    Patient Details  Name: Arthur [name] MRN: [id number] Date of Birth: May 30, 1933  [facility] CM/SW Contact:    Latanya Maudlin, RN Phone Number: 07/07/2018, 12:51 PM  Clinical Narrative:  TOC consulted to complete high risk readmission assessment. Patient is from home alone but his son and daughter live close by.He states he is independent in all ADL's. Current with PCP; obtains [facility] at [facility] on [address]. without difficulty.  Open to [place] for RN, PT, SW, aide and OT.  Will notify [facility] when patient discharges. Melissa aware of admission. Has chronic oxygen at 2L at home through [employer]  He uses a walker at home and his daughter transports him to appointments.  Expected Discharge Plan: [place] Two Barriers to Discharge: Continued Medical Work up   Patient Goals and CMS Choice        Expected Discharge Plan and Services Expected Discharge Plan: [place]   Discharge Planning Services: CM Consult Post Acute Care Choice: Resumption of Svcs/PTA Provider Living arrangements for the past 2 months: [place] Agency: [place] ([place]) Date [place]: 07/07/18 Time HH Agency Contacted: 1250 Representative spoke with at [place]: [place] Arrangements/Services Living arrangements for the past 2 months: [place] with:: Self Patient language and need for interpreter reviewed:: Yes Do you feel safe going back to the place where you live?: Yes      Need for Family Participation in Patient Care: No (Comment)   Current home services: DME, Home PT, Home RN Criminal Activity/Legal Involvement Pertinent to Current Situation/Hospitalization: No - Comment as needed  Activities of Daily Living Home Assistive Devices/Equipment: Walker (specify type), Oxygen, CBG Meter, Shower  chair with back, Nebulizer, Dentures (specify type), Bedside commode/3-in-1 ADL Screening (condition at time of admission) Patient's cognitive ability adequate to safely complete daily activities?: Yes Is the patient deaf or have difficulty hearing?: Yes Does the patient have difficulty seeing, even when wearing glasses/contacts?: No Does the patient have difficulty concentrating, remembering, or making decisions?: No Patient able to express need for assistance with ADLs?: Yes Does the patient have difficulty dressing or bathing?: Yes Independently performs ADLs?: Yes (appropriate for developmental age) Does the patient have difficulty walking or climbing stairs?: Yes Weakness of Legs: Both Weakness of Arms/Hands: Both  Permission Sought/Granted                  Emotional Assessment Appearance:: Appears stated age     Orientation: : Oriented to Self, Oriented to Place, Oriented to  Time, Oriented to Situation      Admission diagnosis:  Hypoxia [R09.02] Patient Active Problem List   Diagnosis Date Noted  . Acute on chronic respiratory failure with hypoxia ([place]) 07/06/2018  . Severe anemia 06/27/2018  . GI bleeding 06/09/2018  . Sepsis ([place]) 05/23/2018  . Cellulitis of right lower extremity 05/22/2018  . DIABETES MELLITUS 10/24/2006  . OBESITY 10/24/2006  . HYPERTENSION 10/24/2006  . ALLERGIC RHINITIS 10/24/2006  . CHRONIC OBSTRUCTIVE PULMONARY DISEASE, MODERATE 10/24/2006  . TOBACCO ABUSE, HX OF 10/24/2006   PCP:  Baxter Hire, MD Pharmacy:   [facility] [address] [place]  [place] [address] Phone: [phone] Fax: [phone]     Social Determinants of Health (SDOH) Interventions    Readmission Risk Interventions Readmission Risk Prevention Plan 07/07/2018 07/02/2018 05/26/2018  Transportation Screening Complete Complete Complete  PCP or Specialist Appt within 5-7 Days - Complete -  Home Care Screening -  Complete Complete  Medication Review (RN CM) - Complete Complete  HRI or Home Care Consult Complete - -  Medication Review (RN Care Manager) Complete - -  Some recent data might be hidden

## 2018-07-07 NOTE — Progress Notes (Signed)
Pharmacy Antibiotic Note  Arthur [name] is a 85 y.o. male admitted on 07/06/2018 with pneumonia.  Pharmacy has been consulted for Vancomycin and Cefepime dosing.  AECHF- on lasix MRSA PCR pending  Plan: - Cefepime 2gm IV q12h - Vancomycin loading dose= 2500 mg   Will continue with: Vancomycin 1500 mg IV Q 48 hrs. Goal AUC 400-550. Expected AUC: 527 SCr used: 2.04 F/u MRSA PCR  Follw renal fxn/ patient on lasix   Height: 5\' 6"  (167.6 cm) Weight: 252 lb 3.2 oz (114.4 kg) IBW/kg (Calculated) : 63.8  Temp (24hrs), Avg:98.1 F (36.7 C), Min:97.9 F (36.6 C), Max:98.5 F (36.9 C)  Recent Labs  Lab 07/01/18 0352 07/02/18 0418 07/03/18 0301 07/04/18 0626 07/05/18 0649 07/06/18 1613 07/06/18 2104 07/07/18 0443  WBC 6.9 6.1 6.0  --  7.1 14.8*  --  15.9*  CREATININE 1.71*  --  1.99* 2.11*  --  1.96*  --  2.04*  LATICACIDVEN  --   --   --   --   --  0.9 0.9  --     Estimated Creatinine Clearance: 32.6 mL/min (A) (by C-G formula based on SCr of 2.04 mg/dL (H)).    Allergies  Allergen Reactions  . Codeine Other (See Comments)    Constipation    Antimicrobials this admission: Cefepime 7/5 >>   Vanc 7/5 >>    Dose adjustments this admission:    Microbiology results: 7/5 BCx: pend   UCx:      Sputum:    7/5 MRSA PCR: pend  Thank you for allowing pharmacy to be a part of this patient's care.  Eward Rutigliano A 07/07/2018 2:19 PM

## 2018-07-07 NOTE — Progress Notes (Addendum)
[facility] [place] NAME: Arthur [name]    MR#:  [id number]  DATE OF BIRTH:  1933/10/27  SUBJECTIVE:  Patient seen and evaluated today On oxygen via nasal cannula at 3 L Shortness of breath present Desaturates upon lying down flat and on ambulation No complaints of chest pain  REVIEW OF SYSTEMS:    ROS  CONSTITUTIONAL: No documented fever. Has fatigue, weakness. No weight gain, no weight loss.  EYES: No blurry or double vision.  ENT: No tinnitus. No postnasal drip. No redness of the oropharynx.  RESPIRATORY: No cough, no wheeze, no hemoptysis. Has dyspnea.  CARDIOVASCULAR: No chest pain. No orthopnea. No palpitations. No syncope.  GASTROINTESTINAL: No nausea, no vomiting or diarrhea. No abdominal pain. No melena or hematochezia.  GENITOURINARY: No dysuria or hematuria.  ENDOCRINE: No polyuria or nocturia. No heat or cold intolerance.  HEMATOLOGY: No anemia. No bruising. No bleeding.  INTEGUMENTARY: No rashes. No lesions.  MUSCULOSKELETAL: No arthritis. No swelling. No gout.  Edema noted NEUROLOGIC: No numbness, tingling, or ataxia. No seizure-type activity.  PSYCHIATRIC: No anxiety. No insomnia. No ADD.   DRUG ALLERGIES:   Allergies  Allergen Reactions  . Codeine Other (See Comments)    Constipation    VITALS:  Blood pressure (!) 153/67, pulse 68, temperature 98.5 F (36.9 C), temperature source Oral, resp. rate 16, height 5\' 6"  (1.676 m), weight 114.4 kg, SpO2 99 %.  PHYSICAL EXAMINATION:   Physical Exam  GENERAL:  85 y.o.-year-old patient lying in the bed with no acute distress.  EYES: Pupils equal, round, reactive to light and accommodation. No scleral icterus. Extraocular muscles intact.  HEENT: Head atraumatic, normocephalic. Oropharynx and nasopharynx clear.  NECK:  Supple, no jugular venous distention. No thyroid enlargement, no tenderness.  LUNGS: Decreased breath sounds bilaterally, bibasilar crepitations heard. No  use of accessory muscles of respiration.  CARDIOVASCULAR: S1, S2 normal. No murmurs, rubs, or gallops.  ABDOMEN: Soft, nontender, nondistended. Bowel sounds present. No organomegaly or mass.  EXTREMITIES: No cyanosis, clubbing  has edema b/l.    NEUROLOGIC: Cranial nerves II through XII are intact. No focal Motor or sensory deficits b/l.   PSYCHIATRIC: The patient is alert and oriented x 3.  SKIN: No obvious rash, lesion, or ulcer.   LABORATORY PANEL:   CBC Recent Labs  Lab 07/07/18 0443  WBC 15.9*  HGB 7.3*  HCT 24.0*  PLT 186   ------------------------------------------------------------------------------------------------------------------ Chemistries  Recent Labs  Lab 07/06/18 1613 07/07/18 0443  NA 139 138  K 3.5 3.6  CL 95* 95*  CO2 36* 35*  GLUCOSE 131* 203*  BUN 31* 33*  CREATININE 1.96* 2.04*  CALCIUM 8.0* 8.1*  AST 14*  --   ALT 12  --   ALKPHOS 49  --   BILITOT 0.8  --    ------------------------------------------------------------------------------------------------------------------  Cardiac Enzymes No results for input(s): TROPONINI in the last 168 hours. ------------------------------------------------------------------------------------------------------------------  RADIOLOGY:  Dg Chest 1 View  Result Date: 07/06/2018 CLINICAL DATA:  Hypoxia, cough, COPD EXAM: CHEST  1 VIEW COMPARISON:  07/02/2018 chest radiograph. FINDINGS: Stable cardiomediastinal silhouette with mild cardiomegaly. No pneumothorax. Small to moderate bilateral pleural effusions, right greater than left, not significantly changed. Mild pulmonary edema. Bibasilar lung opacities. IMPRESSION: 1. Mild congestive heart failure. 2. Small to moderate bilateral pleural effusions, right greater than left. 3. Bibasilar lung opacities, favor atelectasis. Electronically Signed   By: Ilona Sorrel M.D.   On: 07/06/2018 16:33   Ct Chest Wo  Contrast  Result Date: 07/06/2018 CLINICAL DATA:   85 year old male with shortness of breath. Hypoxic. Evidence of bilateral pleural effusions on portable chest. EXAM: CT CHEST WITHOUT CONTRAST TECHNIQUE: Multidetector CT imaging of the chest was performed following the standard protocol without IV contrast. COMPARISON:  Portable chest earlier today. FINDINGS: Cardiovascular: Calcified aortic atherosclerosis. Vascular patency is not evaluated in the absence of IV contrast. Calcified coronary artery atherosclerosis on series 2, image 86. Cardiac size at the upper limits of normal. No pericardial effusion. Mediastinum/Nodes: Mediastinal lymph nodes are within normal limits. No axillary lymphadenopathy. Lungs/Pleura: Moderate size bilateral layering pleural effusions. Simple fluid density suggesting transudate. Major airways are patent. There is mild generalized peribronchial thickening. Bilateral centrilobular emphysema. Confluent compressive atelectasis versus consolidation in both lower lobes, with air bronchograms greater on the right (series 2, image 91). Elsewhere there is occasional mild peribronchial nodularity which has a postinflammatory appearance. There is also some bilateral subpleural scarring. There is a small 11 millimeter area of ground-glass opacity in the right upper lobe on series 3, image 53. Upper Abdomen: Gallstone measuring about 22 millimeters. The visible gallbladder does not appear inflamed. Negative visible noncontrast liver, spleen, pancreas, adrenal glands, kidneys, and bowel. Musculoskeletal: Ankylosis in portions of the thoracic spine. The upper thoracic levels and cervicothoracic junction appear spared. Osteopenia. No acute osseous abnormality identified. There are healed chronic fractures of the posterior lower left ribs. IMPRESSION: 1. Moderate size layering bilateral pleural effusions with compressive atelectasis versus pneumonia in both lower lobes. 2. Underlying Emphysema (ICD10-J43.9). Occasional mild peribronchial nodularity  which has a postinflammatory appearance. Small 11 mm ground-glass opacity in the right upper lobe. Initial follow-up by chest CT without contrast is recommended in 3 months to confirm persistence. This recommendation follows the consensus statement: Recommendations for the Management of Subsolid Pulmonary Nodules Detected at CT: A Statement from the Fleischner Society as published in Radiology 2013; 266:304-317. 3. Calcified coronary artery and Aortic Atherosclerosis (ICD10-I70.0). 4. Cholelithiasis. Electronically Signed   By: Genevie Ann M.D.   On: 07/06/2018 20:28     ASSESSMENT AND PLAN:  85 year old male patient with history of COPD, type 2 diabetes mellitus, hypertension, hyperlipidemia, chronic anemia, chronic kidney disease stage III, GERD, hyperlipidemia, diastolic heart failure currently under hospitalist service for shortness of breath  -Acute on chronic respiratory failure with hypoxia Secondary to CHF exacerbation Continue diuresis with Lasix Monitor renal function closely Input output chart and body weight Oxygen supplementation via nasal cannula  -Acute on chronic diastolic heart failure exacerbation Noncontrast CT shows pleural effusions bilaterally Continue diuresis with Lasix Recent echo reviewed showed EF of 55 to 60%  -Bilateral pleural effusions secondary to fluid overload Continue diuresis with Lasix If needed thoracentesis  -Healthcare associated pneumonia Start patient on IV vancomycin and cefepime antibiotics Follow-up cultures  -CKD stage III Monitor renal function  Avoid nephrotoxic drugs  -Emphysema stable continue home dose inhalers  -Benign prostate hypertrophy Continue Flomax  -Hyperlipidemia Continue statin   All the records are reviewed and case discussed with Care Management/Social Worker. Management plans discussed with the patient, family and they are in agreement.  CODE STATUS: Full code  DVT Prophylaxis: SCDs  TOTAL TIME TAKING CARE OF  THIS PATIENT: 39 minutes.   POSSIBLE D/C IN 2 to 3 DAYS, DEPENDING ON CLINICAL CONDITION.  Saundra Shelling M.D on 07/07/2018 at 10:43 AM  Between 7am to 6pm - Pager - [phone]  After 6pm go to [email or web address] - password [email or web address] [facility]  Office  [phone]  CC: Primary care physician; Baxter Hire, MD  Note: This dictation was prepared with Dragon dictation along with smaller phrase technology. Any transcriptional errors that result from this process are unintentional.

## 2018-07-07 NOTE — Progress Notes (Signed)
Patient with low fingerstick blood glucose of 26 at 1700. Patient without complaints.  Alert and oriented to this RN, skin slightly clammy.  Patient eating dinner and given 8oz orange juice with sugar.  Rechecked at 1715 and 47, orange juice given again.  At 1755 rechecked and fingerstick 80.

## 2018-07-07 NOTE — Progress Notes (Signed)
**Note Arthur-Identified via Obfuscation** PT Screening Note  Patient Details Name: Arthur [name] MRN: [id number] DOB: 15-Jan-1933   Reason Eval/Treat Not Completed: PT screened, no acute needs identified, will sign off. Pt recently seen by our services 3x last admission, the most recent treatment on 07/05/2018 (2DA), wherein pt moving at his baseline level which is limited-household-ambulation-distances with assistive device and supplemental O2. Pt returns with desaturation issues, fluid overload, however I see no reported acute functional changes. PT recommendations from prior admission for HHPT services (no acute DME needs recommended by PT) remain appropriate at this time. PT recommending OOB to chair daily with nursing and toileting in BR rather than BSC to maintain typical daily mobility patterns and prevent deconditioning. Recommend nursing team promote AMB in room with RW, 3L/min O2 (or greater as needed), and +1 assistance for safety/supervision. Should patient presentation indicate an acute decline in function or independence, please place new order for PT to evaluate. Thank you for this consult!   9:27 AM, 07/07/18 Etta Grandchild, PT, DPT Physical Therapist - [facility]  [phone] ([place])    [name],Arthur C 07/07/2018, 9:22 AM

## 2018-07-07 NOTE — Progress Notes (Signed)
Advanced care plan. Purpose of the Encounter: CODE STATUS Parties in Attendance: Patient Patient's Decision Capacity: Good Subjective/Patient's story: Arthur [name]  is a 85 y.o. male with a known history of COPD, diabetes, hypertension, hyperlipidemia, chronic anemia, recent admission secondary to symptomatic anemia status post transfusion and also chronic lymphedema on the lower extremities who returns back to the hospital today due to shortness of breath/hypoxia.  Patient says the home health nurse came to see him today and checked his pulse ox and it was noted to be in the low to mid 80s.  She advised him to come to the ER for further evaluation.  Emergency room patient was noted to be hypoxic when he lies flat with O2 sats in the low to mid 80s on 2 to 3 L chronically for him.  Patient underwent a chest x-ray which shows bilateral pleural effusions right greater than left with some mild pulmonary vascular congestion.  Patient's BNP is not significantly elevated and he himself does not complain of worsening shortness of breath.  Given his worsening hypoxemia hospital services were contacted for admission. Patient denies any fevers, chills, nausea, vomiting abdominal pain, melena hematochezia or any other associated symptoms presently.  His COVID-19 test is still pending Objective/Medical story Patient needs diuresis with Lasix for heart failure exacerbation oxygen supplementation via nasal cannula.  Patient needs diuresis for pleural effusions and volume overload.  Has pneumonia needs IV antibiotics.  Needs aggressive physical therapy Goals of care determination:  Advance care directives goals of care and treatment plan discussed Patient does not want CPR, intubation ventilator if the need arises CODE STATUS: DNR Time spent discussing advanced care planning: 16 minutes

## 2018-07-08 LAB — GLUCOSE, CAPILLARY
Glucose-Capillary: 40 mg/dL — CL (ref 70–99)
Glucose-Capillary: 71 mg/dL (ref 70–99)
Glucose-Capillary: 76 mg/dL (ref 70–99)
Glucose-Capillary: 138 mg/dL — ABNORMAL HIGH (ref 70–99)
Glucose-Capillary: 157 mg/dL — ABNORMAL HIGH (ref 70–99)

## 2018-07-08 LAB — BASIC METABOLIC PANEL
Anion gap: 8 (ref 5–15)
BUN: 36 mg/dL — ABNORMAL HIGH (ref 8–23)
CO2: 37 mmol/L — ABNORMAL HIGH (ref 22–32)
Calcium: 7.9 mg/dL — ABNORMAL LOW (ref 8.9–10.3)
Chloride: 94 mmol/L — ABNORMAL LOW (ref 98–111)
Creatinine, Ser: 2.14 mg/dL — ABNORMAL HIGH (ref 0.61–1.24)
GFR calc Af Amer: 32 mL/min — ABNORMAL LOW (ref >60)
GFR calc non Af Amer: 28 mL/min — ABNORMAL LOW (ref >60)
Glucose, Bld: 74 mg/dL (ref 70–99)
Potassium: 3.4 mmol/L — ABNORMAL LOW (ref 3.5–5.1)
Sodium: 139 mmol/L (ref 135–145)

## 2018-07-08 LAB — CBC
HCT: 24.6 % — ABNORMAL LOW (ref 39.0–52.0)
Hemoglobin: 7.5 g/dL — ABNORMAL LOW (ref 13.0–17.0)
MCH: 29.5 pg (ref 26.0–34.0)
MCHC: 30.5 g/dL (ref 30.0–36.0)
MCV: 96.9 fL (ref 80.0–100.0)
Platelets: 178 10*3/uL (ref 150–400)
RBC: 2.54 MIL/uL — ABNORMAL LOW (ref 4.22–5.81)
RDW: 18.1 % — ABNORMAL HIGH (ref 11.5–15.5)
WBC: 15.6 10*3/uL — ABNORMAL HIGH (ref 4.0–10.5)
nRBC: 0.2 % (ref 0.0–0.2)

## 2018-07-08 MED ORDER — POTASSIUM CHLORIDE CRYS ER 20 MEQ PO TBCR
20.0000 meq | EXTENDED_RELEASE_TABLET | Freq: Once | ORAL | Status: AC
  Administered 2018-07-08: 20 meq via ORAL
  Filled 2018-07-08: qty 1

## 2018-07-08 MED ORDER — INSULIN DETEMIR 100 UNIT/ML ~~LOC~~ SOLN
20.0000 [IU] | Freq: Every day | SUBCUTANEOUS | Status: DC
  Administered 2018-07-08: 20 [IU] via SUBCUTANEOUS
  Filled 2018-07-08 (×2): qty 0.2

## 2018-07-08 MED ORDER — INSULIN ASPART 100 UNIT/ML ~~LOC~~ SOLN
0.0000 [IU] | Freq: Three times a day (TID) | SUBCUTANEOUS | Status: DC
  Administered 2018-07-08 – 2018-07-09 (×2): 3 [IU] via SUBCUTANEOUS
  Administered 2018-07-09: 4 [IU] via SUBCUTANEOUS
  Administered 2018-07-10: 11 [IU] via SUBCUTANEOUS
  Administered 2018-07-10: 3 [IU] via SUBCUTANEOUS
  Filled 2018-07-08 (×5): qty 1

## 2018-07-08 NOTE — Progress Notes (Signed)
[facility] [place] NAME: Arthur [name]    MR#:  [id number]  DATE OF BIRTH:  1933/08/11  SUBJECTIVE:  Patient seen and evaluated today On oxygen via nasal cannula at 3 L Shortness of breath present Desaturates upon lying down flat and on ambulation No complaints of chest pain  REVIEW OF SYSTEMS:    Review of Systems  Constitutional: Negative for chills, fever and weight loss.  HENT: Negative for ear discharge, ear pain and nosebleeds.   Eyes: Negative for blurred vision, pain and discharge.  Respiratory: Positive for cough and shortness of breath. Negative for sputum production, wheezing and stridor.   Cardiovascular: Positive for leg swelling. Negative for chest pain, palpitations, orthopnea and PND.  Gastrointestinal: Negative for abdominal pain, diarrhea, nausea and vomiting.  Genitourinary: Negative for frequency and urgency.  Musculoskeletal: Positive for joint pain. Negative for back pain.  Neurological: Positive for weakness. Negative for sensory change, speech change and focal weakness.  Psychiatric/Behavioral: Negative for depression and hallucinations. The patient is not nervous/anxious.    DRUG ALLERGIES:   Allergies  Allergen Reactions  . Codeine Other (See Comments)    Constipation    VITALS:  Blood pressure (!) 156/56, pulse 70, temperature 98.3 F (36.8 C), temperature source Oral, resp. rate 18, height 5\' 6"  (1.676 m), weight 114.2 kg, SpO2 95 %.  PHYSICAL EXAMINATION:   Physical Exam  GENERAL:  85 y.o.-year-old patient lying in the bed with no acute distress. obese EYES: Pupils equal, round, reactive to light and accommodation. No scleral icterus. Extraocular muscles intact.  HEENT: Head atraumatic, normocephalic. Oropharynx and nasopharynx clear.  NECK:  Supple, no jugular venous distention. No thyroid enlargement, no tenderness.  LUNGS: Decreased breath sounds bilaterally, bibasilar crepitations heard. No use of  accessory muscles of respiration.  CARDIOVASCULAR: S1, S2 normal. No murmurs, rubs, or gallops.  ABDOMEN: Soft, nontender, nondistended. Bowel sounds present. No organomegaly or mass.  EXTREMITIES: No cyanosis, clubbing  has ++ acute on chronic edema b/l.   Chronic venous stasis+ NEUROLOGIC: Cranial nerves II through XII are intact. No focal Motor or sensory deficits b/l.  Globally weak PSYCHIATRIC:  patient is alert and oriented x 3.  SKIN: No obvious rash, lesion, or ulcer.   LABORATORY PANEL:   CBC Recent Labs  Lab 07/08/18 0533  WBC 15.6*  HGB 7.5*  HCT 24.6*  PLT 178   ------------------------------------------------------------------------------------------------------------------ Chemistries  Recent Labs  Lab 07/06/18 1613  07/08/18 0533  NA 139   < > 139  K 3.5   < > 3.4*  CL 95*   < > 94*  CO2 36*   < > 37*  GLUCOSE 131*   < > 74  BUN 31*   < > 36*  CREATININE 1.96*   < > 2.14*  CALCIUM 8.0*   < > 7.9*  AST 14*  --   --   ALT 12  --   --   ALKPHOS 49  --   --   BILITOT 0.8  --   --    < > = values in this interval not displayed.   ------------------------------------------------------------------------------------------------------------------  Cardiac Enzymes No results for input(s): TROPONINI in the last 168 hours. ------------------------------------------------------------------------------------------------------------------  RADIOLOGY:  Dg Chest 1 View  Result Date: 07/06/2018 CLINICAL DATA:  Hypoxia, cough, COPD EXAM: CHEST  1 VIEW COMPARISON:  07/02/2018 chest radiograph. FINDINGS: Stable cardiomediastinal silhouette with mild cardiomegaly. No pneumothorax. Small to moderate bilateral pleural effusions, right greater than left,  not significantly changed. Mild pulmonary edema. Bibasilar lung opacities. IMPRESSION: 1. Mild congestive heart failure. 2. Small to moderate bilateral pleural effusions, right greater than left. 3. Bibasilar lung opacities,  favor atelectasis. Electronically Signed   By: Ilona Sorrel M.D.   On: 07/06/2018 16:33   Ct Chest Wo Contrast  Result Date: 07/06/2018 CLINICAL DATA:  85 year old male with shortness of breath. Hypoxic. Evidence of bilateral pleural effusions on portable chest. EXAM: CT CHEST WITHOUT CONTRAST TECHNIQUE: Multidetector CT imaging of the chest was performed following the standard protocol without IV contrast. COMPARISON:  Portable chest earlier today. FINDINGS: Cardiovascular: Calcified aortic atherosclerosis. Vascular patency is not evaluated in the absence of IV contrast. Calcified coronary artery atherosclerosis on series 2, image 86. Cardiac size at the upper limits of normal. No pericardial effusion. Mediastinum/Nodes: Mediastinal lymph nodes are within normal limits. No axillary lymphadenopathy. Lungs/Pleura: Moderate size bilateral layering pleural effusions. Simple fluid density suggesting transudate. Major airways are patent. There is mild generalized peribronchial thickening. Bilateral centrilobular emphysema. Confluent compressive atelectasis versus consolidation in both lower lobes, with air bronchograms greater on the right (series 2, image 91). Elsewhere there is occasional mild peribronchial nodularity which has a postinflammatory appearance. There is also some bilateral subpleural scarring. There is a small 11 millimeter area of ground-glass opacity in the right upper lobe on series 3, image 53. Upper Abdomen: Gallstone measuring about 22 millimeters. The visible gallbladder does not appear inflamed. Negative visible noncontrast liver, spleen, pancreas, adrenal glands, kidneys, and bowel. Musculoskeletal: Ankylosis in portions of the thoracic spine. The upper thoracic levels and cervicothoracic junction appear spared. Osteopenia. No acute osseous abnormality identified. There are healed chronic fractures of the posterior lower left ribs. IMPRESSION: 1. Moderate size layering bilateral pleural  effusions with compressive atelectasis versus pneumonia in both lower lobes. 2. Underlying Emphysema (ICD10-J43.9). Occasional mild peribronchial nodularity which has a postinflammatory appearance. Small 11 mm ground-glass opacity in the right upper lobe. Initial follow-up by chest CT without contrast is recommended in 3 months to confirm persistence. This recommendation follows the consensus statement: Recommendations for the Management of Subsolid Pulmonary Nodules Detected at CT: A Statement from the Fleischner Society as published in Radiology 2013; 266:304-317. 3. Calcified coronary artery and Aortic Atherosclerosis (ICD10-I70.0). 4. Cholelithiasis. Electronically Signed   By: Genevie Ann M.D.   On: 07/06/2018 20:28     ASSESSMENT AND PLAN:  85 year old male patient with history of COPD, type 2 diabetes mellitus, hypertension, hyperlipidemia, chronic anemia, chronic kidney disease stage III, GERD, hyperlipidemia, diastolic heart failure currently under hospitalist service for shortness of breath  -Acute on chronic respiratory failure with hypoxia Secondary to CHF exacerbation acute on chronic diastolic Continue diuresis with Lasix--good uop Monitor renal function closely Input output chart and body weight Oxygen supplementation via nasal cannula  -Acute on chronic diastolic heart failure exacerbation Noncontrast CT shows pleural effusions bilaterally Continue diuresis with Lasix Recent echo reviewed showed EF of 55 to 60%  -Healthcare associated pneumonia  patient on IVcefepime antibiotics--change to oral abxs tomorrow Follow-up cultures negative so far  -CKD stage III Monitor renal function  Avoid nephrotoxic drugs -creat baseline 1.9-2.6  -Emphysema stable continue home dose inhalers  -Benign prostate hypertrophy Continue Flomax  -Hyperlipidemia Continue statin  D/w dter [name] on the phone   CODE STATUS: Full code  DVT Prophylaxis: SCDs  TOTAL TIME TAKING CARE OF THIS  PATIENT: 29 minutes.   POSSIBLE D/C IN 1-2 DAYS, DEPENDING ON CLINICAL CONDITION.  Fritzi Mandes M.D on 07/08/2018 at  2:16 PM  Between 7am to 6pm - Pager - [phone]  After 6pm go to [email or web address] - password [email or web address] [facility]  Office  [phone]  CC: Primary care physician; Baxter Hire, MD  Note: This dictation was prepared with Dragon dictation along with smaller phrase technology. Any transcriptional errors that result from this process are unintentional.

## 2018-07-08 NOTE — Progress Notes (Signed)
Pharmacy Antibiotic Note  Arthur [name] is a 85 y.o. male admitted on 07/06/2018 with pneumonia.  Pharmacy has been consulted for Vancomycin and Cefepime dosing.  AECHF- on lasix MRSA PCR NEGATIVE   Plan: - Cefepime 2gm IV q12h - Discuss with attending utility of Vancomycin given MRSA PCR is negative.       Update: Vancomycin discontinued.    Follw renal fxn/ patient on lasix   Height: 5\' 6"  (167.6 cm) Weight: 251 lb 12.3 oz (114.2 kg) IBW/kg (Calculated) : 63.8  Temp (24hrs), Avg:98.2 F (36.8 C), Min:98 F (36.7 C), Max:98.3 F (36.8 C)  Recent Labs  Lab 07/03/18 0301 07/04/18 0626 07/05/18 0649 07/06/18 1613 07/06/18 2104 07/07/18 0443 07/08/18 0533  WBC 6.0  --  7.1 14.8*  --  15.9* 15.6*  CREATININE 1.99* 2.11*  --  1.96*  --  2.04* 2.14*  LATICACIDVEN  --   --   --  0.9 0.9  --   --     Estimated Creatinine Clearance: 31.1 mL/min (A) (by C-G formula based on SCr of 2.14 mg/dL (H)).    Allergies  Allergen Reactions  . Codeine Other (See Comments)    Constipation    Antimicrobials this admission: Cefepime 7/5 >>   Vanc 7/5 >>  7/6  Dose adjustments this admission:    Microbiology results: 7/5 BCx: NGTD 7/5 MRSA PCR: negative   Thank you for allowing pharmacy to be a part of this patient's care.  Rowland Lathe 07/08/2018 9:10 AM

## 2018-07-09 ENCOUNTER — Ambulatory Visit: Admit: 2018-07-09 | Payer: Medicare HMO | Admitting: Gastroenterology

## 2018-07-09 LAB — GLUCOSE, CAPILLARY
Glucose-Capillary: 86 mg/dL (ref 70–99)
Glucose-Capillary: 148 mg/dL — ABNORMAL HIGH (ref 70–99)
Glucose-Capillary: 191 mg/dL — ABNORMAL HIGH (ref 70–99)
Glucose-Capillary: 246 mg/dL — ABNORMAL HIGH (ref 70–99)

## 2018-07-09 SURGERY — IMAGING PROCEDURE, GI TRACT, INTRALUMINAL, VIA CAPSULE

## 2018-07-09 MED ORDER — FUROSEMIDE 20 MG PO TABS
20.0000 mg | ORAL_TABLET | Freq: Every day | ORAL | Status: DC
  Administered 2018-07-10: 20 mg via ORAL
  Filled 2018-07-09: qty 1

## 2018-07-09 MED ORDER — SODIUM CHLORIDE 0.9% FLUSH
3.0000 mL | Freq: Two times a day (BID) | INTRAVENOUS | Status: DC
  Administered 2018-07-09 – 2018-07-10 (×3): 3 mL via INTRAVENOUS

## 2018-07-09 MED ORDER — INSULIN DETEMIR 100 UNIT/ML ~~LOC~~ SOLN
10.0000 [IU] | Freq: Every day | SUBCUTANEOUS | Status: DC
  Administered 2018-07-09: 10 [IU] via SUBCUTANEOUS
  Filled 2018-07-09 (×2): qty 0.1

## 2018-07-09 MED ORDER — CEFDINIR 300 MG PO CAPS
300.0000 mg | ORAL_CAPSULE | Freq: Two times a day (BID) | ORAL | Status: DC
  Administered 2018-07-09 – 2018-07-10 (×3): 300 mg via ORAL
  Filled 2018-07-09 (×5): qty 1

## 2018-07-09 NOTE — Plan of Care (Signed)
  Problem: Education: Goal: Knowledge of General Education information will improve Description: Including pain rating scale, medication(s)/side effects and non-pharmacologic comfort measures Outcome: Progressing   Problem: Health Behavior/Discharge Planning: Goal: Ability to manage health-related needs will improve Outcome: Progressing   Problem: Clinical Measurements: Goal: Ability to maintain clinical measurements within normal limits will improve Outcome: Progressing   Problem: Cardiac: Goal: Ability to achieve and maintain adequate cardiopulmonary perfusion will improve Outcome: Progressing   Problem: Clinical Measurements: Goal: Will remain free from infection Outcome: Not Progressing Note: Remains afebrile, WBC 15.6, On IV antibotics Goal: Diagnostic test results will improve Outcome: Not Progressing Note: WBC 15.6 HGB 7.5/24.6 Goal: Respiratory complications will improve Outcome: Not Progressing Note: Remains on 3Lo2, pt dyspneic upon exertion   Problem: Activity: Goal: Capacity to carry out activities will improve Outcome: Not Progressing

## 2018-07-09 NOTE — Care Management Important Message (Signed)
Important Message  Patient Details  Name: Arthur [name] MRN: [id number] Date of Birth: May 13, 1933   Medicare Important Message Given:  Yes     Dannette Barbara 07/09/2018, 11:36 AM

## 2018-07-09 NOTE — Progress Notes (Signed)
[place] at [place] NAME: Arthur [name]    MR#:  [id number]  DATE OF BIRTH:  1933/08/19  SUBJECTIVE:  Out in the chair On oxygen via nasal cannula at 3 L sats 100% Feels better No complaints of chest pain  REVIEW OF SYSTEMS:    Review of Systems  Constitutional: Negative for chills, fever and weight loss.  HENT: Negative for ear discharge, ear pain and nosebleeds.   Eyes: Negative for blurred vision, pain and discharge.  Respiratory: Positive for shortness of breath. Negative for sputum production, wheezing and stridor.   Cardiovascular: Positive for leg swelling. Negative for chest pain, palpitations, orthopnea and PND.  Gastrointestinal: Negative for abdominal pain, diarrhea, nausea and vomiting.  Genitourinary: Negative for frequency and urgency.  Musculoskeletal: Positive for joint pain. Negative for back pain.  Neurological: Positive for weakness. Negative for sensory change, speech change and focal weakness.  Psychiatric/Behavioral: Negative for depression and hallucinations. The patient is not nervous/anxious.    DRUG ALLERGIES:   Allergies  Allergen Reactions  . Codeine Other (See Comments)    Constipation    VITALS:  Blood pressure (!) 143/43, pulse 72, temperature 98.1 F (36.7 C), temperature source Oral, resp. rate 19, height 5\' 6"  (1.676 m), weight 114.2 kg, SpO2 100 %.  PHYSICAL EXAMINATION:   Physical Exam  GENERAL:  85 y.o.-year-old patient lying in the bed with no acute distress. obese EYES: Pupils equal, round, reactive to light and accommodation. No scleral icterus. Extraocular muscles intact.  HEENT: Head atraumatic, normocephalic. Oropharynx and nasopharynx clear.  NECK:  Supple, no jugular venous distention. No thyroid enlargement, no tenderness.  LUNGS: Decreased breath sounds bilaterally, bibasilar crepitations heard. No use of accessory muscles of respiration.  CARDIOVASCULAR: S1, S2 normal. No murmurs,  rubs, or gallops.  ABDOMEN: Soft, nontender, nondistended. Bowel sounds present. No organomegaly or mass.  EXTREMITIES: No cyanosis, clubbing  has ++ acute on chronic edema b/l.   Chronic venous stasis+ NEUROLOGIC: Cranial nerves II through XII are intact. No focal Motor or sensory deficits b/l.  Globally weak PSYCHIATRIC:  patient is alert and oriented x 3.  SKIN: No obvious rash, lesion, or ulcer.   LABORATORY PANEL:   CBC Recent Labs  Lab 07/08/18 0533  WBC 15.6*  HGB 7.5*  HCT 24.6*  PLT 178   ------------------------------------------------------------------------------------------------------------------ Chemistries  Recent Labs  Lab 07/06/18 1613  07/08/18 0533  NA 139   < > 139  K 3.5   < > 3.4*  CL 95*   < > 94*  CO2 36*   < > 37*  GLUCOSE 131*   < > 74  BUN 31*   < > 36*  CREATININE 1.96*   < > 2.14*  CALCIUM 8.0*   < > 7.9*  AST 14*  --   --   ALT 12  --   --   ALKPHOS 49  --   --   BILITOT 0.8  --   --    < > = values in this interval not displayed.   ------------------------------------------------------------------------------------------------------------------  Cardiac Enzymes No results for input(s): TROPONINI in the last 168 hours. ------------------------------------------------------------------------------------------------------------------  RADIOLOGY:  No results found.   ASSESSMENT AND PLAN:  85 year old male patient with history of COPD, type 2 diabetes mellitus, hypertension, hyperlipidemia, chronic anemia, chronic kidney disease stage III, GERD, hyperlipidemia, diastolic heart failure currently under hospitalist service for shortness of breath  *Acute on chronic respiratory failure with hypoxia Secondary to CHF exacerbation  acute on chronic diastolic Continue diuresis with Lasix--good uop about 1770cc Monitor renal function closely--not much room to give IV lasix. Change to oral laisx Oxygen supplementation via nasal cannula--wean to  2 liter [place]  *Acute on chronic diastolic heart failure exacerbation Noncontrast CT shows pleural effusions bilaterally Continue diuresis with Lasix Recent echo reviewed showed EF of 55 to 60%  *Healthcare associated pneumonia  patient on IVcefepime antibiotics--change to oral abxs today Follow-up cultures negative so far  *CKD stage III Monitor renal function  Avoid nephrotoxic drugs -creat baseline 1.9-2.6  *Emphysema stable continue home dose inhalers -follows with dr Lanney Gins  *Benign prostate hypertrophy Continue Flomax  *Hyperlipidemia Continue statin  D/w dter [name] on the phone--she wants pt to stay one more day to see how he is doing   CODE STATUS: Full code  DVT Prophylaxis: SCDs  TOTAL TIME TAKING CARE OF THIS PATIENT: 29 minutes.   POSSIBLE D/C IN 1-2 DAYS, DEPENDING ON CLINICAL CONDITION.  Fritzi Mandes M.D on 07/09/2018 at 12:46 PM  Between 7am to 6pm - Pager - [phone]  After 6pm go to [email or web address] - password [email or web address] [facility]  Office  [phone]  CC: Primary care physician; Baxter Hire, MD  Note: This dictation was prepared with Dragon dictation along with smaller phrase technology. Any transcriptional errors that result from this process are unintentional.

## 2018-07-09 NOTE — Progress Notes (Signed)
Oxygen decreased to 2L per [place], will monitor

## 2018-07-10 ENCOUNTER — Encounter: Payer: Self-pay | Admitting: *Deleted

## 2018-07-10 ENCOUNTER — Inpatient Hospital Stay: Payer: Medicare HMO

## 2018-07-10 LAB — GLUCOSE, CAPILLARY
Glucose-Capillary: 129 mg/dL — ABNORMAL HIGH (ref 70–99)
Glucose-Capillary: 289 mg/dL — ABNORMAL HIGH (ref 70–99)

## 2018-07-10 MED ORDER — CEFDINIR 300 MG PO CAPS: 300.0000 mg | ORAL_CAPSULE | Freq: Two times a day (BID) | ORAL | 0 refills | Status: DC

## 2018-07-10 MED ORDER — POLYSACCHARIDE IRON COMPLEX 150 MG PO CAPS
150.0000 mg | ORAL_CAPSULE | Freq: Every day | ORAL | Status: DC
  Administered 2018-07-10: 150 mg via ORAL
  Filled 2018-07-10: qty 1

## 2018-07-10 MED ORDER — LEVEMIR 100 UNIT/ML ~~LOC~~ SOLN: 12.0000 [IU] | Freq: Every day | SUBCUTANEOUS | 0 refills | Status: DC

## 2018-07-10 MED ORDER — POLYSACCHARIDE IRON COMPLEX 150 MG PO CAPS: 150.0000 mg | ORAL_CAPSULE | Freq: Every day | ORAL | 0 refills | Status: DC

## 2018-07-10 NOTE — TOC Transition Note (Signed)
Transition of Care [facility]) - CM/SW Discharge Note   Patient Details  Name: Arthur [name] MRN: [id number] Date of Birth: 1933/04/20  Transition of Care [facility]) CM/SW Contact:  Shade Flood, LCSW Phone Number: 07/10/2018, 10:17 AM   Clinical Narrative:     Per MD, pt to have thoracentesis this AM and will possibly dc home later today. Pt was active with [facility] prior to admission and he will continue with their care at dc. MD is requesting [facility] 2 week protocol for pt's [facility] services. Updated Santiago Glad at [facility] and she states that they will follow for this care.   Spoke with pt's daughter, [name], by phone to update. She is aware of plan and will pick up pt at dc. She states she will bring one of his portable O2 tanks at that time.  TOC will follow until dc.    Barriers to Discharge: Continued Medical Work up   Patient Goals and CMS Choice        Discharge Placement                       Discharge Plan and Services   Discharge Planning Services: CM Consult Post Acute Care Choice: Resumption of Svcs/PTA Provider                      [facility] Agency: [place] ([place]) Date [place]: 07/07/18 Time [place]: 1250 Representative spoke with at [place]: [place] ([place]) Interventions     Readmission Risk Interventions Readmission Risk Prevention Plan 07/10/2018 07/07/2018 07/02/2018  Transportation Screening - Complete Complete  PCP or Specialist Appt within 5-7 Days - - Complete  PCP or Specialist Appt within 3-5 Days Complete - -  Home Care Screening - - Complete  Medication Review (RN CM) - - Complete  HRI or [place] - Complete -  Social Work Consult for [place] Planning/Counseling Complete - -  Palliative Care Screening Not Applicable - -  Medication Review (RN Care Manager) - Complete -  Some recent data might be hidden

## 2018-07-10 NOTE — Discharge Instructions (Signed)
Low salt diet, use your oxygen all the time and keep log of your sugars at home

## 2018-07-10 NOTE — Discharge Summary (Signed)
[place] at [place] NAME: Arthur [name]    MR#:  [id number]  DATE OF BIRTH:  1933-09-13  DATE OF ADMISSION:  07/06/2018 ADMITTING PHYSICIAN: Henreitta Leber, MD  DATE OF DISCHARGE: 07/10/2018 PRIMARY CARE PHYSICIAN: Baxter Hire, MD    ADMISSION DIAGNOSIS:  Hypoxia [R09.02]  DISCHARGE DIAGNOSIS:  acute on chronic hypoxic respiratory failure secondary to acute on chronic CHF diastolic right-sided pleural effusion status post thoracentesis  SECONDARY DIAGNOSIS:   Past Medical History:  Diagnosis Date  . COPD (chronic obstructive pulmonary disease) ([place])   . Diabetes mellitus without complication ([place])   . Hyperlipidemia   . Hypertension     HOSPITAL COURSE:   85 year old male patient with history of COPD, type 2 diabetes mellitus, hypertension, hyperlipidemia, chronic anemia, chronic kidney disease stage III, GERD, hyperlipidemia, diastolic heart failure currently under hospitalist service for shortness of breath  *Acute on chronic respiratory failure with hypoxia Secondary to CHF exacerbation acute on chronic diastolic Continue diuresis with Lasix--good uop about 2600 cc Monitor renal function closely--not much room to give IV lasix. Change to oral laisx Oxygen supplementation via nasal cannula--wean to 2 liter [place]  *Right side pleural effusion -Noncontrast CT shows pleural effusions bilaterally--right more than left.  -s/p therapuetic thoracentesis with 1.2 liter fluid removal -Continue diuresis with Lasix -Recent echo reviewed showed EF of 55 to 60% -sats 98% on 2liter  *Healthcare associated pneumonia  patient on IVcefepime antibiotics--change to oral abxs today Follow-up cultures negative so far  *CKD stage III Monitor renal function  Avoid nephrotoxic drugs -creat baseline 1.9-2.14  * chronic anemia with recent GI w/o and BT -started on po iron polysacharide  *Emphysema stable  -continue home dose  inhalers -follows with dr Lanney Gins  *Benign prostate hypertrophy Continue Flomax  *Hyperlipidemia Continue statin  D/w dter [name] on the phone- and mentioned about thoracentesis. Patient tolerated the procedure well. Post procedure x-ray looks better. Discussed with daughter [name] on the phone again and discussed discharge plan. Daughter is agreeable.  Patient is enrolled now in [facility] two week post discharge follow-up protocol thru [facility]  CONSULTS OBTAINED:    DRUG ALLERGIES:   Allergies  Allergen Reactions  . Codeine Other (See Comments)    Constipation    DISCHARGE MEDICATIONS:   Allergies as of 07/10/2018      Reactions   Codeine Other (See Comments)   Constipation      Medication List    TAKE these medications   albuterol 108 (90 Base) MCG/ACT inhaler Commonly known as: VENTOLIN HFA Inhale 2 puffs into the lungs every 6 (six) hours as needed for wheezing or shortness of breath.   amLODipine 2.5 MG tablet Commonly known as: NORVASC Take 2.5 mg by mouth daily.   cefdinir 300 MG capsule Commonly known as: OMNICEF Take 1 capsule (300 mg total) by mouth every 12 (twelve) hours.   furosemide 20 MG tablet Commonly known as: LASIX Take 1 tablet (20 mg total) by mouth daily.   insulin aspart 100 UNIT/ML injection Commonly known as: novoLOG Inject 20 Units into the skin 3 (three) times daily before meals.   ipratropium-albuterol 0.5-2.5 (3) MG/3ML Soln Commonly known as: DUONEB Take 3 mLs by nebulization every 6 (six) hours as needed.   iron polysaccharides 150 MG capsule Commonly known as: NIFEREX Take 1 capsule (150 mg total) by mouth daily. Start taking on: July 11, 2018   Levemir 100 UNIT/ML injection Generic drug: insulin detemir Inject  0.12 mLs (12 Units total) into the skin daily. What changed:   how much to take  when to take this   nystatin cream Commonly known as: MYCOSTATIN Apply topically 2 (two) times daily.   pantoprazole 40  MG tablet Commonly known as: Protonix Take 1 tablet (40 mg total) by mouth 2 (two) times daily.   pravastatin 10 MG tablet Commonly known as: PRAVACHOL Take 10 mg by mouth at bedtime.   tamsulosin 0.4 MG Caps capsule Commonly known as: FLOMAX Take 0.4 mg by mouth daily.       If you experience worsening of your admission symptoms, develop shortness of breath, life threatening emergency, suicidal or homicidal thoughts you must seek medical attention immediately by calling 911 or calling your MD immediately  if symptoms less severe.  You Must read complete instructions/literature along with all the possible adverse reactions/side effects for all the Medicines you take and that have been prescribed to you. Take any new Medicines after you have completely understood and accept all the possible adverse reactions/side effects.   Please note  You were cared for by a hospitalist during your hospital stay. If you have any questions about your discharge medications or the care you received while you were in the hospital after you are discharged, you can call the unit and asked to speak with the hospitalist on call if the hospitalist that took care of you is not available. Once you are discharged, your primary care physician will handle any further medical issues. Please note that NO REFILLS for any discharge medications will be authorized once you are discharged, as it is imperative that you return to your primary care physician (or establish a relationship with a primary care physician if you do not have one) for your aftercare needs so that they can reassess your need for medications and monitor your lab values.  DATA REVIEW:   CBC  Recent Labs  Lab 07/08/18 0533  WBC 15.6*  HGB 7.5*  HCT 24.6*  PLT 178    Chemistries  Recent Labs  Lab 07/06/18 1613  07/08/18 0533  NA 139   < > 139  K 3.5   < > 3.4*  CL 95*   < > 94*  CO2 36*   < > 37*  GLUCOSE 131*   < > 74  BUN 31*   < > 36*   CREATININE 1.96*   < > 2.14*  CALCIUM 8.0*   < > 7.9*  AST 14*  --   --   ALT 12  --   --   ALKPHOS 49  --   --   BILITOT 0.8  --   --    < > = values in this interval not displayed.    Microbiology Results   Recent Results (from the past 240 hour(s))  SARS Coronavirus 2 (CEPHEID - Performed in [place] hospital lab), Hosp Order     Status: None   Collection Time: 07/06/18  7:14 PM   Specimen: Nasopharyngeal Swab  Result Value Ref Range Status   SARS Coronavirus 2 NEGATIVE NEGATIVE Final    Comment: (NOTE) If result is NEGATIVE SARS-CoV-2 target nucleic acids are NOT DETECTED. The SARS-CoV-2 RNA is generally detectable in upper and lower  respiratory specimens during the acute phase of infection. The lowest  concentration of SARS-CoV-2 viral copies this assay can detect is 250  copies / mL. A negative result does not preclude SARS-CoV-2 infection  and should not be used  as the sole basis for treatment or other  patient management decisions.  A negative result may occur with  improper specimen collection / handling, submission of specimen other  than nasopharyngeal swab, presence of viral mutation(s) within the  areas targeted by this assay, and inadequate number of viral copies  (<250 copies / mL). A negative result must be combined with clinical  observations, patient history, and epidemiological information. If result is POSITIVE SARS-CoV-2 target nucleic acids are DETECTED. The SARS-CoV-2 RNA is generally detectable in upper and lower  respiratory specimens dur ing the acute phase of infection.  Positive  results are indicative of active infection with SARS-CoV-2.  Clinical  correlation with patient history and other diagnostic information is  necessary to determine patient infection status.  Positive results do  not rule out bacterial infection or co-infection with other viruses. If result is PRESUMPTIVE POSTIVE SARS-CoV-2 nucleic acids MAY BE PRESENT.   A  presumptive positive result was obtained on the submitted specimen  and confirmed on repeat testing.  While 2019 novel coronavirus  (SARS-CoV-2) nucleic acids may be present in the submitted sample  additional confirmatory testing may be necessary for epidemiological  and / or clinical management purposes  to differentiate between  SARS-CoV-2 and other Sarbecovirus currently known to infect humans.  If clinically indicated additional testing with an alternate test  methodology [phone]) is advised. The SARS-CoV-2 RNA is generally  detectable in upper and lower respiratory sp ecimens during the acute  phase of infection. The expected result is Negative. Fact Sheet for Patients:  StrictlyIdeas.no Fact Sheet for Healthcare Providers: BankingDealers.co.za This test is not yet approved or cleared by the [place] FDA and has been authorized for detection and/or diagnosis of SARS-CoV-2 by FDA under an Emergency Use Authorization (EUA).  This EUA will remain in effect (meaning this test can be used) for the duration of the COVID-19 declaration under Section 564(b)(1) of the Act, 21 U.S.C. section 360bbb-3(b)(1), unless the authorization is terminated or revoked sooner. Performed at [facility], [address] [place], [place] [address]   CULTURE, BLOOD (ROUTINE X 2) w Reflex to ID Panel     Status: None (Preliminary result)   Collection Time: 07/07/18 11:34 AM   Specimen: BLOOD  Result Value Ref Range Status   Specimen Description BLOOD LEFT ARM  Final   Special Requests   Final    BOTTLES DRAWN AEROBIC AND ANAEROBIC Blood Culture results may not be optimal due to an excessive volume of blood received in culture bottles   Culture   Final    NO GROWTH 3 DAYS Performed at [facility], [address] [place], [place] [address]    Report Status PENDING  Incomplete  CULTURE, BLOOD (ROUTINE X 2) w Reflex to ID Panel      Status: None (Preliminary result)   Collection Time: 07/07/18 11:45 AM   Specimen: BLOOD  Result Value Ref Range Status   Specimen Description BLOOD LEFT HAND  Final   Special Requests   Final    BOTTLES DRAWN AEROBIC AND ANAEROBIC Blood Culture adequate volume   Culture   Final    NO GROWTH 3 DAYS Performed at [facility], [address] [place], [place] [address]    Report Status PENDING  Incomplete  MRSA PCR Screening     Status: None   Collection Time: 07/07/18  3:56 PM   Specimen: Nasal Mucosa; Nasopharyngeal  Result Value Ref Range Status   MRSA by  PCR NEGATIVE NEGATIVE Final    Comment:        The GeneXpert MRSA Assay (FDA approved for NASAL specimens only), is one component of a comprehensive MRSA colonization surveillance program. It is not intended to diagnose MRSA infection nor to guide or monitor treatment for MRSA infections. Performed at [facility], [address] [place], [place] [address]     RADIOLOGY:  Dg Chest Port 1 View  Result Date: 07/10/2018 CLINICAL DATA:  Post right-sided thoracentesis EXAM: PORTABLE CHEST 1 VIEW COMPARISON:  07/06/2018; chest CT-07/06/2018 FINDINGS: Grossly unchanged cardiac silhouette and mediastinal contours with atherosclerotic plaque within the thoracic aorta. Interval reduction in persistent trace right-sided effusion post thoracentesis. No pneumothorax. Unchanged small left-sided pleural effusion with associated left basilar opacities. Improved aeration of the right lung base. The pulmonary vasculature remains indistinct with cephalization of flow. No acute osseous abnormalities. IMPRESSION: 1. Interval reduction in persistent trace right-sided effusion post thoracentesis. No pneumothorax. 2. Similar findings of cardiomegaly, pulmonary edema, small left-sided effusion and bibasilar opacities, likely atelectasis. Electronically Signed   By: Sandi Mariscal M.D.   On: 07/10/2018 12:54   US Thoracentesis Asp  Pleural Space W/img Guide  Result Date: 07/10/2018 INDICATION: History of recurrent congestive heart failure now with symptomatic right-sided pleural effusion. Please perform ultrasound-guided thoracentesis for diagnostic and therapeutic purposes. EXAM: US THORACENTESIS ASP PLEURAL SPACE W/IMG GUIDE COMPARISON:  Chest CT-07/06/2018; chest radiograph-07/06/2018 MEDICATIONS: None. COMPLICATIONS: None immediate. TECHNIQUE: Informed written consent was obtained from the patient after a discussion of the risks, benefits and alternatives to treatment. A timeout was performed prior to the initiation of the procedure. Initial ultrasound scanning demonstrates a moderate to large sized anechoic right-sided pleural effusion. The lower chest was prepped and draped in the usual sterile fashion. 1% lidocaine was used for local anesthesia. An ultrasound image was saved for documentation purposes. An 8 Fr Safe-T-Centesis catheter was introduced. The thoracentesis was performed. The catheter was removed and a dressing was applied. The patient tolerated the procedure well without immediate post procedural complication. The patient was escorted to have an upright chest radiograph. FINDINGS: A total of approximately 1.2 liters of serous fluid was removed. Requested samples were sent to the laboratory. IMPRESSION: Successful ultrasound-guided right sided thoracentesis yielding 1.2 liters of pleural fluid. Electronically Signed   By: Sandi Mariscal M.D.   On: 07/10/2018 12:55     CODE STATUS:     Code Status Orders  (From admission, onward)         Start     Ordered   07/06/18 2055  Do not attempt resuscitation (DNR)  Continuous    Question Answer Comment  In the event of cardiac or respiratory ARREST Do not call a "code blue"   In the event of cardiac or respiratory ARREST Do not perform Intubation, CPR, defibrillation or ACLS   In the event of cardiac or respiratory ARREST Use medication by any route, position, wound  care, and other measures to relive pain and suffering. May use oxygen, suction and manual treatment of airway obstruction as needed for comfort.      07/06/18 2054        Code Status History    Date Active Date Inactive Code Status Order ID Comments User Context   06/27/2018 1150 07/05/2018 1757 DNR [id number]  Lang Snow, NP Inpatient   06/09/2018 1350 06/14/2018 1730 DNR [id number]  Lang Snow, NP ED   05/23/2018 0919 05/28/2018 2024 DNR [id number]  Demetrios Loll, MD Inpatient  05/23/2018 0104 05/23/2018 0918 Full Code [id number]  Mayer Camel, NP Inpatient   Advance Care Planning Activity    Advance Directive Documentation     Most Recent Value  Type of Advance Directive  Healthcare Power of Attorney  Pre-existing out of facility DNR order (yellow form or pink MOST form)  -  "MOST" Form in Place?  -      TOTAL TIME TAKING CARE OF THIS PATIENT: 40 minutes.    Fritzi Mandes M.D on 07/10/2018 at 2:37 PM  Between 7am to 6pm - Pager - [phone] After 6pm go to [email or web address] - password [email or web address] [facility]  Office  [phone]  CC: Primary care physician; Baxter Hire, MD

## 2018-07-10 NOTE — Progress Notes (Signed)
[place] [facility] [place] NAME: Arthur [name]    MR#:  [id number]  DATE OF BIRTH:  1933-03-30  SUBJECTIVE:  Out in the chair On oxygen via nasal cannula at 2 L sats 98 % Feels better No complaints of chest pain  REVIEW OF SYSTEMS:    Review of Systems  Constitutional: Negative for chills, fever and weight loss.  HENT: Negative for ear discharge, ear pain and nosebleeds.   Eyes: Negative for blurred vision, pain and discharge.  Respiratory: Positive for shortness of breath. Negative for sputum production, wheezing and stridor.   Cardiovascular: Positive for leg swelling. Negative for chest pain, palpitations, orthopnea and PND.  Gastrointestinal: Negative for abdominal pain, diarrhea, nausea and vomiting.  Genitourinary: Negative for frequency and urgency.  Musculoskeletal: Positive for joint pain. Negative for back pain.  Neurological: Positive for weakness. Negative for sensory change, speech change and focal weakness.  Psychiatric/Behavioral: Negative for depression and hallucinations. The patient is not nervous/anxious.    DRUG ALLERGIES:   Allergies  Allergen Reactions  . Codeine Other (See Comments)    Constipation    VITALS:  Blood pressure (!) 152/55, pulse 72, temperature 98.4 F (36.9 C), temperature source Oral, resp. rate 18, height 5\' 6"  (1.676 m), weight 120 kg, SpO2 97 %.  PHYSICAL EXAMINATION:   Physical Exam  GENERAL:  85 y.o.-year-old patient lying in the bed with no acute distress. obese EYES: Pupils equal, round, reactive to light and accommodation. No scleral icterus. Extraocular muscles intact.  HEENT: Head atraumatic, normocephalic. Oropharynx and nasopharynx clear.  NECK:  Supple, no jugular venous distention. No thyroid enlargement, no tenderness.  LUNGS: Decreased breath sounds bilaterally, bibasilar crepitations heard. No use of accessory muscles of respiration.  CARDIOVASCULAR: S1, S2 normal. No murmurs, rubs,  or gallops.  ABDOMEN: Soft, nontender, nondistended. Bowel sounds present. No organomegaly or mass.  EXTREMITIES: No cyanosis, clubbing  has ++ acute on chronic edema b/l.   Chronic venous stasis+ NEUROLOGIC: Cranial nerves II through XII are intact. No focal Motor or sensory deficits b/l.  Globally weak PSYCHIATRIC:  patient is alert and oriented x 3.  SKIN: No obvious rash, lesion, or ulcer.   LABORATORY PANEL:   CBC Recent Labs  Lab 07/08/18 0533  WBC 15.6*  HGB 7.5*  HCT 24.6*  PLT 178   ------------------------------------------------------------------------------------------------------------------ Chemistries  Recent Labs  Lab 07/06/18 1613  07/08/18 0533  NA 139   < > 139  K 3.5   < > 3.4*  CL 95*   < > 94*  CO2 36*   < > 37*  GLUCOSE 131*   < > 74  BUN 31*   < > 36*  CREATININE 1.96*   < > 2.14*  CALCIUM 8.0*   < > 7.9*  AST 14*  --   --   ALT 12  --   --   ALKPHOS 49  --   --   BILITOT 0.8  --   --    < > = values in this interval not displayed.   ------------------------------------------------------------------------------------------------------------------  Cardiac Enzymes No results for input(s): TROPONINI in the last 168 hours. ------------------------------------------------------------------------------------------------------------------  RADIOLOGY:  No results found.   ASSESSMENT AND PLAN:  85 year old male patient with history of COPD, type 2 diabetes mellitus, hypertension, hyperlipidemia, chronic anemia, chronic kidney disease stage III, GERD, hyperlipidemia, diastolic heart failure currently under hospitalist service for shortness of breath  *Acute on chronic respiratory failure with hypoxia Secondary to CHF  exacerbation acute on chronic diastolic Continue diuresis with Lasix--good uop about 2600 cc Monitor renal function closely--not much room to give IV lasix. Change to oral laisx Oxygen supplementation via nasal cannula--wean to 2  liter [place]  *Acute on chronic diastolic heart failure exacerbation -Noncontrast CT shows pleural effusions bilaterally--right more than left.  -IR to try therapuetic thoracentesis -Continue diuresis with Lasix -Recent echo reviewed showed EF of 55 to 60%  *Healthcare associated pneumonia  patient on IVcefepime antibiotics--change to oral abxs today Follow-up cultures negative so far  *CKD stage III Monitor renal function  Avoid nephrotoxic drugs -creat baseline 1.9-2.14  *Emphysema stable  -continue home dose inhalers -follows with dr Lanney Gins  *Benign prostate hypertrophy Continue Flomax  *Hyperlipidemia Continue statin  D/w dter [name] on the phone- and mentioned about thoracentesis.   CODE STATUS: DNR  DVT Prophylaxis: heparin  TOTAL TIME TAKING CARE OF THIS PATIENT: 29 minutes.   POSSIBLE D/C IN 1DAYS, DEPENDING ON CLINICAL CONDITION.  Fritzi Mandes M.D on 07/10/2018 at 10:13 AM  Between 7am to 6pm - Pager - [phone]  After 6pm go to [email or web address] - password [email or web address] [facility]  Office  [phone]  CC: Primary care physician; Baxter Hire, MD  Note: This dictation was prepared with Dragon dictation along with smaller phrase technology. Any transcriptional errors that result from this process are unintentional.

## 2018-07-10 NOTE — Plan of Care (Signed)
  Problem: Clinical Measurements: Goal: Respiratory complications will improve Outcome: Progressing Note: Back on chronic 2L O2 satting in the high 90's   Problem: Activity: Goal: Risk for activity intolerance will decrease Outcome: Progressing Note: Up and down from chair and bed multiple times during the night, with 1 assist and a walker, tolerating well   Problem: Elimination: Goal: Will not experience complications related to urinary retention Outcome: Progressing   Problem: Pain Managment: Goal: General experience of comfort will improve Outcome: Progressing Note: No complaints of pain this shift   Problem: Safety: Goal: Ability to remain free from injury will improve Outcome: Progressing   Problem: Skin Integrity: Goal: Risk for impaired skin integrity will decrease Outcome: Progressing   Problem: Nutrition: Goal: Adequate nutrition will be maintained Outcome: Completed/Met   Problem: Coping: Goal: Level of anxiety will decrease Outcome: Completed/Met

## 2018-07-10 NOTE — Procedures (Signed)
Pre procedural Dx: Symptomatic Pleural effusion Post procedural Dx: Same  Successful US guided right sided thoracentesis yielding 1.2 L of serous pleural fluid.   Samples sent to lab for analysis.  EBL: None  Complications: None immediate.  Jay Leopold Smyers, MD Pager #: [phone]   

## 2018-07-10 NOTE — Progress Notes (Signed)
Discharge instructions explained to pt and pts daughter, Lisa/ verbalized an understanding/ iv and tele removed/  Will transport off unit when ride arrives

## 2018-07-11 ENCOUNTER — Ambulatory Visit: Payer: Medicare HMO | Admitting: Gastroenterology

## 2018-07-11 DIAGNOSIS — J449 Chronic obstructive pulmonary disease, unspecified: Secondary | ICD-10-CM | POA: Diagnosis not present

## 2018-07-11 DIAGNOSIS — D62 Acute posthemorrhagic anemia: Secondary | ICD-10-CM | POA: Diagnosis not present

## 2018-07-11 DIAGNOSIS — B9689 Other specified bacterial agents as the cause of diseases classified elsewhere: Secondary | ICD-10-CM | POA: Diagnosis not present

## 2018-07-11 DIAGNOSIS — I129 Hypertensive chronic kidney disease with stage 1 through stage 4 chronic kidney disease, or unspecified chronic kidney disease: Secondary | ICD-10-CM | POA: Diagnosis not present

## 2018-07-11 DIAGNOSIS — E1122 Type 2 diabetes mellitus with diabetic chronic kidney disease: Secondary | ICD-10-CM | POA: Diagnosis not present

## 2018-07-11 DIAGNOSIS — I872 Venous insufficiency (chronic) (peripheral): Secondary | ICD-10-CM | POA: Diagnosis not present

## 2018-07-11 DIAGNOSIS — K921 Melena: Secondary | ICD-10-CM | POA: Diagnosis not present

## 2018-07-11 DIAGNOSIS — N183 Chronic kidney disease, stage 3 (moderate): Secondary | ICD-10-CM | POA: Diagnosis not present

## 2018-07-11 DIAGNOSIS — L03115 Cellulitis of right lower limb: Secondary | ICD-10-CM | POA: Diagnosis not present

## 2018-07-12 ENCOUNTER — Encounter: Payer: Self-pay | Admitting: *Deleted

## 2018-07-12 ENCOUNTER — Other Ambulatory Visit: Payer: Self-pay

## 2018-07-12 ENCOUNTER — Other Ambulatory Visit: Payer: Self-pay | Admitting: *Deleted

## 2018-07-12 DIAGNOSIS — I5033 Acute on chronic diastolic (congestive) heart failure: Secondary | ICD-10-CM

## 2018-07-12 LAB — CULTURE, BLOOD (ROUTINE X 2)
Culture: NO GROWTH
Culture: NO GROWTH
Special Requests: ADEQUATE

## 2018-07-12 NOTE — Progress Notes (Signed)
This encounter was created in error - please disregard.

## 2018-07-12 NOTE — Patient Outreach (Signed)
[facility]) [place] Telephone Outreach PCP Office completes Transition of Care follow up post-hospital discharge Post-hospital discharge day# 2  07/12/2018  Arthur [name] Apr 17, 1933 [id number]  Successful telephone outreach to Arthur [name], daughter/ caregiver/ HCPOA, on [facility] DPR, for Arthur [name], 85 y/o male referred to [place] by inpatient RN CM during recent hospital visit July 4-8, 2020 for acute on chronic respiratory failure with hypoxia secondary to acute on chronic CHF; patient was noted to have (R) pleural effusion and had thoracentesis during hospital visit with 1.2 liters of fluid removed.  Patient was discharged home to self-care with ongoing home health services through [facility] care.  Patient has history including, but not limited to, DM; HTN/ HLD; obesity; COPD, on home O2; anemia of unknown origin; chronic respiratory failure; CKD- III.  HIPAA/ identity verified with caregiver today and [facility] CM services were discussed with caregiver, including how [facility] CM services differ from home health services.  Caregiver provides verbal consent today for [facility] CM involvement in patient's care.  Pleasant 75 minute phone call today (extended phone conversation due to identification of acute clinical issue at time of call)  Caregiver reports today that things "are okay at the moment" but she immediately tells me that she was called at work by family member sitting with patient this afternoon, that patient had a blood sugar reading of 33 with symptoms consistent with acute hypoglycemia.  Caregiver stated that she told grandson to give patient a honey bun and she immediately came home to address situation.  States she has checked patient's blood sugar when she arrived at home and it had came up to 62; states she is feeding patient more food now, and states he feels better and "looks better."    We extensively discussed signs/ symptoms  hypoglycemia along with action plan for same and caregiver is familiar; she reports that patient has stopped recording his blood sugars since his hospital discharge and she is unsure if/ how many times he has been running low blood sugars.  Education was provided on importance of monitoring AND recording blood sugars as well as how to use glucometer at home to look at patient's recent trends.  Encouraged caregiver to immediately resume recording of blood sugars and she is in agreement.  We then discussed patient's use of prescribed insulin and I discovered that caregiver was uninformed to reduce HS long acting insulin to 12 Units post-hospital discharge and that she has been giving patient 45 Units (as prescribed at time of PCP office visit 05/29/2018), along with 20 units short acting insulin with meals.  Reports that hospital staff did not specifically mention to reduce HS insulin, nor provide parameters around when to hold meal time insulin; verbalized frustration around not being able to physically be at hospital with patient due to current coronavirus restrictions.  Caregiver states that patient will "hold" meal time insulin if his blood sugar readings are "between 130-140," but she is uncertain if patient has been doing this consistently, or basing whether he takes/ holds meal time insulin on presence of hypoglycemic symptoms.  Caregiver verbalizes understanding that patient should always eat when administering insulin at mealtime.  Caregiver agrees to begin recording blood sugars and to reduce HS long acting insulin as outlined on post-hospital discharge instructions.  All discharge instructions were reviewed thoroughly with caregiver today.  Discussed importance of caregiver informing care providers of recent hypoglycemic episode and to have specific instructions for both long  and short acting insulin clarified at time of upcoming provider office visit.  Caregiver reports that she and family members are  "doing the best they can" to make sure patient is cared for properly at his home, and she states that patient and family have been overwhelmed by his recent frequent hospitalizations for multiple reasons; states, "until May 2020, he had never had a hospital visit, and now he has had 4 in 3 months."  Caregiver verbalizes fears that patient "is giving up;" she denies hopelessness, but states, "he just seems to not really care anymore."  Encouraged her to share her perception around this with care providers and she verbalized agreement.  Caregiver further reports:  Medications: -- Has all medicationsand takes as prescribed;denies questions/ concerns about current medications, once concerns around insulin were addressed, as above. Confirms has obtained and started administering new post-discharge medications (Omnicef and Niferex) -- Verbalizes good general understanding of the purpose, dosing, and scheduling of medications.   -- caregiver manages medications for patient using weekly pill planner box; patient takes independently once in pill box. -- denies issues with swallowing medications -- patient was recently discharged from the hospital and all medications were thoroughly reviewed with caregiver today  Medications Reviewed Today    Reviewed by Knox Royalty, RN (Registered Nurse) on 07/12/18 at 31  Med List Status: <None>  Medication Order Taking? Sig Documenting Provider Last Dose Status Informant  albuterol (VENTOLIN HFA) 108 (90 Base) MCG/ACT inhaler [id number]  Inhale 2 puffs into the lungs every 6 (six) hours as needed for wheezing or shortness of breath. Demetrios Loll, MD  Active Family Member  amLODipine [facility]) 2.5 MG tablet [id number]  Take 2.5 mg by mouth daily. [provider]  Active Family Member  cefdinir (OMNICEF) 300 MG capsule [id number]  Take 1 capsule (300 mg total) by mouth every 12 (twelve) hours. Fritzi Mandes, MD  Active   furosemide (LASIX) 20 MG tablet [id number]   Take 1 tablet (20 mg total) by mouth daily. Saundra Shelling, MD  Active Family Member  insulin aspart (NOVOLOG) 100 UNIT/ML injection [id number]  Inject 20 Units into the skin 3 (three) times daily before meals. [provider]  Active Family Member  ipratropium-albuterol (DUONEB) 0.5-2.5 (3) MG/3ML SOLN [id number]  Take 3 mLs by nebulization every 6 (six) hours as needed. Bettey Costa, MD  Active Family Member  iron polysaccharides (NIFEREX) 150 MG capsule [id number]  Take 1 capsule (150 mg total) by mouth daily. Fritzi Mandes, MD  Active   LEVEMIR 100 UNIT/ML injection [id number]  Inject 0.12 mLs (12 Units total) into the skin daily. Fritzi Mandes, MD  Active   nystatin cream (MYCOSTATIN) [id number]  Apply topically 2 (two) times daily. Bettey Costa, MD  Active Family Member  pantoprazole (PROTONIX) 40 MG tablet [id number]  Take 1 tablet (40 mg total) by mouth 2 (two) times daily. Lang Snow, NP  Active Family Member  pravastatin (PRAVACHOL) 10 MG tablet [id number]  Take 10 mg by mouth at bedtime. [provider]  Active Family Member  tamsulosin (FLOMAX) 0.4 MG CAPS capsule [id number]  Take 0.4 mg by mouth daily. [provider]  Active Family Member         Home health [facility]) services: -- [place] services for PT, RN, bath aide continue through [facility], who were providing home health services prior to most recent hospitalization; states she is happy with these services and confirms that home health team has visited patient since most recent hospital  discharge -- confirms that she has contact information for home health team/ agency; caregiver questions that hospital discharging physician told her that he was "ordering a special type" of home health services that would include daily nurse visits; through review of EMR, I noted that discharging MD requested "[facility] 2 week prorocol" through [facility] care; offered to place care coordination outreach to  [facility] to follow up on this, but caregiver states she will do, as she communicates with home health team frequently-- made caregiver aware that if she needs assistance at any point to clarify, I would be happy to assist.  Provider appointments: -- All upcoming provider appointments were reviewed with caregiver today- she verbalizes an accurate undertsanding of same and confirms that she transports and attends all provider office visits with patient ---- PCP office visit Mon 7/13 ---- eye exam Monday 7/13: hospital lost patient's glasses at most recent admission; states she has heard from Patient relations and that patient will be reimbursed for the loss of his glasses ---- GI provider office visit Monday 7/13- to explore reasons for patient's newly developed anemia of unknown cause ---- Renal provider 07/23/2018- new patient appointment  Social/ Community Resource needs: -- currently denies community resource needs, stating supportive family members that assist with care needs as indicated: family has been alternating staying with patient during the day, but patient stays by himself in his home during night; states that family members live within walking distance of patient and "are in out" all day long.  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 that patient will not have as many people in and out checking on him, as other family members will be returning to [employer] school, etc; discussed options available to address this, including community resource and private duty options: encouraged caregiver to begin thinking about this for further discussion, and shared with her that [facility] is available; caregiver agrees to further discuss at time of next  outreach, stating that "things are stable for now; I will talk to other family members"  Advanced Directive (AD) Planning:   --reports does currently have exisisting AD in place for living will/ HCPOA; confirms that patient has MOST form in clear view at his home; denies desire to make changes; endorses patient is No code status  Self-health management of multiple chronic conditions, including DM/ CHF/ COPD/ anemia: -- thoroughly discussed acute DM management as noted above -- patient continues on home O2; uses nebulizer 2-3 times per day on most days -- reports now that patient is better from his hypoglycemic episode am hour ago, caregiver denies clinical concerns around patient's condition and states that he seems much better by the time our call was completed -- stressed the importance of promptly notifying care providers for any new concerns/ issues/ problems that arise and not to wait until patient is in distress- she verbalizes agreement with this plan -- discussed with caregiver that we would further discuss self-health management strategies of chronic conditions during scheduled call next week, and she is agreeable.  We scheduled next [place] outreach for next week based on her preference around her days off  Caregiver denies further issues, concerns, or problems today.  I provided/ confirmed that she has my direct phone number, the main [facility] CM  office phone number, and the [facility] CM 24-hour nurse advice phone number should issues arise prior to next scheduled [place] outreach.  Encouraged patient to contact me directly if needs, questions, issues, or concerns arise prior to next scheduled outreach; patient agreed to do so.  Plan:  Patient will take medications as prescribed and will attend all scheduled provider appointments  Patient will promptly notify care providers for any new concerns/ issues/ problems that arise  Patient will actively participate in home health  services as ordered post-hospital discharge  Patient will continue monitoring/ and will resume recording daily blood sugars 4 times per day  I will make patient's PCP aware of [place] RN CM involvement in patient's care-- will send barriers letter  I will share today's notes/ care plan with patient's PCP to make aware of patient's hypoglycemic episode today and need for clarification of long and short acting insulin orders  [facility] CM outreach to continue with scheduled phone call to caregiver next week, post-scheduled provider appointment   [facility] CM Care Plan Problem One     Most Recent Value  Care Plan Problem One  High Risk for hospital readmission, related to/ as evidenced by multiple recent hospitalizations for multiple chronic conditions  Role Documenting the Problem One  Care Management Coordinator  Care Plan for Problem One  Active  [facility] Long Term Goal   Over the next 31 days, patient will not experience hospital readmission, as evidenced by patient/ caregiver reporting and review of EMR during [facility] RN CM outreach  [facility] Long Term Goal Start Date  07/12/18  Interventions for Problem One Long Term Goal  Discussed patient's current clinical condition with patient's caregiver and reviewed most recent hospital discharge instructions,  medication review completed with caregiver,  discussed with caregiver concerns around patient's current insulin dosing in light of her report of patient's episode of hypoglycemia today,  coached caregiver around talking points for upcoming PCP office visit scheduled for Monday and discussed importance of patient monitoring and recording all subsequent blood pressures to take to upcoming PCP appointment,  discussed signs/ symptoms hypoglycemia with caregiver along with corresponding action plan for same.  [place] program initiated  [facility] CM Short Term Goal #1   Over the next 30 days, patient will attend all scheduled provider appointments as evidenced  by patient/ caregiver reporting and review of EMR during [place] RN CM outreach  [facility] CM Short Term Goal #1 Start Date  07/12/18  Interventions for Short Term Goal #1  Reviewed upcoming provider appointments with caregiver and confirmed that patient has reliable transportation through family members to all provider appointments with plans to attend all as scheduled  [facility] CM Short Term Goal #2   Over the next 30 days, patient will actively participate in home health services as ordered post-hospital discharge, as evidenced by patient/ caregiver reporting and collaboration with home health team as indicated during [place] outreach  [facility] CM Short Term Goal #2 Start Date  07/12/18  Interventions for Short Term Goal #2  Confirmed that caregiver has contact information for home health team and that home health agency has visited patient at his home post-most recent hospital discharge,  discussed specialty home health program ordered at time of hospital discharge by hospital discharging physician and encouraged caregiver/ daughter to follow up with home health team around details of this program-- offered my assistance as/ if needed     I appreciate the opportunity to participate in  Arthur [name] care,  Oneta Rack, RN, BSN, [employer] Coordinator [facility] Care Management  [phone]

## 2018-07-14 DIAGNOSIS — D62 Acute posthemorrhagic anemia: Secondary | ICD-10-CM | POA: Diagnosis not present

## 2018-07-14 DIAGNOSIS — E1122 Type 2 diabetes mellitus with diabetic chronic kidney disease: Secondary | ICD-10-CM | POA: Diagnosis not present

## 2018-07-14 DIAGNOSIS — B9689 Other specified bacterial agents as the cause of diseases classified elsewhere: Secondary | ICD-10-CM | POA: Diagnosis not present

## 2018-07-14 DIAGNOSIS — L03115 Cellulitis of right lower limb: Secondary | ICD-10-CM | POA: Diagnosis not present

## 2018-07-14 DIAGNOSIS — I872 Venous insufficiency (chronic) (peripheral): Secondary | ICD-10-CM | POA: Diagnosis not present

## 2018-07-14 DIAGNOSIS — N183 Chronic kidney disease, stage 3 (moderate): Secondary | ICD-10-CM | POA: Diagnosis not present

## 2018-07-14 DIAGNOSIS — J449 Chronic obstructive pulmonary disease, unspecified: Secondary | ICD-10-CM | POA: Diagnosis not present

## 2018-07-14 DIAGNOSIS — K921 Melena: Secondary | ICD-10-CM | POA: Diagnosis not present

## 2018-07-14 DIAGNOSIS — I129 Hypertensive chronic kidney disease with stage 1 through stage 4 chronic kidney disease, or unspecified chronic kidney disease: Secondary | ICD-10-CM | POA: Diagnosis not present

## 2018-07-15 ENCOUNTER — Other Ambulatory Visit: Payer: Self-pay | Admitting: Gastroenterology

## 2018-07-15 ENCOUNTER — Other Ambulatory Visit: Payer: Self-pay

## 2018-07-15 ENCOUNTER — Ambulatory Visit: Payer: Medicare HMO | Admitting: Gastroenterology

## 2018-07-15 ENCOUNTER — Encounter: Payer: Self-pay | Admitting: Gastroenterology

## 2018-07-15 VITALS — BP 121/68 | HR 75 | Temp 98.5°F | Ht 71.0 in | Wt 255.0 lb

## 2018-07-15 DIAGNOSIS — K921 Melena: Secondary | ICD-10-CM

## 2018-07-15 DIAGNOSIS — E1165 Type 2 diabetes mellitus with hyperglycemia: Secondary | ICD-10-CM | POA: Diagnosis not present

## 2018-07-15 DIAGNOSIS — R9389 Abnormal findings on diagnostic imaging of other specified body structures: Secondary | ICD-10-CM | POA: Diagnosis not present

## 2018-07-15 DIAGNOSIS — I10 Essential (primary) hypertension: Secondary | ICD-10-CM | POA: Diagnosis not present

## 2018-07-15 DIAGNOSIS — J431 Panlobular emphysema: Secondary | ICD-10-CM | POA: Diagnosis not present

## 2018-07-15 DIAGNOSIS — N183 Chronic kidney disease, stage 3 (moderate): Secondary | ICD-10-CM | POA: Diagnosis not present

## 2018-07-15 NOTE — Progress Notes (Signed)
Jonathon Bellows MD, MRCP(U.K) [address]  [place]  [place], [place] [address]  Main: [phone]  Fax: [phone]   Primary Care Physician: Baxter Hire, MD  Primary Gastroenterologist:  Dr. Jonathon Bellows  Follow-up after capsule study results.  HPI: MATTSON DAYAL is a 85 y.o. male  Summary of history : Admitted on 06/09/2018 with melena. Hb 3.6 grams with MCV 100. Prior to that Hb was 8 grams 3 weeks prior. Urine showed no blood. No iron studies or b12 levels checked. Underwent a blood transfusion.   EGD+colonoscopy was performed that showed no lesions that would explain the severe anemia.  Possible short segment barrettes esophagus. Plan was for outpatient capsule study   Interval history   06/19/2018-07/15/2018  06/30/2018: Capsule study of small bowel showed no clear source of bleeding.  Plan was to avoid NSAIDs monitor CBC and transfuse as needed.     Current Outpatient Medications  Medication Sig Dispense Refill   albuterol (VENTOLIN HFA) 108 (90 Base) MCG/ACT inhaler Inhale 2 puffs into the lungs every 6 (six) hours as needed for wheezing or shortness of breath. 1 Inhaler 2   amLODipine (NORVASC) 2.5 MG tablet Take 2.5 mg by mouth daily.     cefdinir (OMNICEF) 300 MG capsule Take 1 capsule (300 mg total) by mouth every 12 (twelve) hours. 8 capsule 0   furosemide (LASIX) 20 MG tablet Take 1 tablet (20 mg total) by mouth daily. 30 tablet 0   insulin aspart (NOVOLOG) 100 UNIT/ML injection Inject 20 Units into the skin 3 (three) times daily before meals.     ipratropium-albuterol (DUONEB) 0.5-2.5 (3) MG/3ML SOLN Take 3 mLs by nebulization every 6 (six) hours as needed. 360 mL 0   iron polysaccharides (NIFEREX) 150 MG capsule Take 1 capsule (150 mg total) by mouth daily. 30 capsule 0   LEVEMIR 100 UNIT/ML injection Inject 0.12 mLs (12 Units total) into the skin daily. 10 mL 0   nystatin cream (MYCOSTATIN) Apply topically 2 (two) times daily. 30 g 0    pantoprazole (PROTONIX) 40 MG tablet Take 1 tablet (40 mg total) by mouth 2 (two) times daily. 60 tablet 0   pravastatin (PRAVACHOL) 10 MG tablet Take 10 mg by mouth at bedtime.     tamsulosin (FLOMAX) 0.4 MG CAPS capsule Take 0.4 mg by mouth daily.     No current facility-administered medications for this visit.     Allergies as of 07/15/2018 - Review Complete 07/12/2018  Allergen Reaction Noted   Codeine Other (See Comments) 08/06/2013    ROS:  General: Negative for anorexia, weight loss, fever, chills, fatigue, weakness. ENT: Negative for hoarseness, difficulty swallowing , nasal congestion. CV: Negative for chest pain, angina, palpitations, dyspnea on exertion, peripheral edema.  Respiratory: Negative for dyspnea at rest, dyspnea on exertion, cough, sputum, wheezing.  GI: See history of present illness. GU:  Negative for dysuria, hematuria, urinary incontinence, urinary frequency, nocturnal urination.  Endo: Negative for unusual weight change.    Physical Examination:   There were no vitals taken for this visit.  General: Well-nourished, well-developed in no acute distress.  Eyes: No icterus. Conjunctivae pink. Mouth: Oropharyngeal mucosa moist and pink , no lesions erythema or exudate. Lungs: Clear to auscultation bilaterally. Non-labored. Heart: Regular rate and rhythm, no murmurs rubs or gallops.  Abdomen: Bowel sounds are normal, nontender, nondistended, no hepatosplenomegaly or masses, no abdominal bruits or hernia , no rebound or guarding.   Extremities: No lower extremity edema. No clubbing  or deformities. Neuro: Alert and oriented x 3.  Grossly intact. Skin: Warm and dry, no jaundice.   Psych: Alert and cooperative, normal mood and affect.   Imaging Studies: Dg Chest 1 View  Result Date: 07/06/2018 CLINICAL DATA:  Hypoxia, cough, COPD EXAM: CHEST  1 VIEW COMPARISON:  07/02/2018 chest radiograph. FINDINGS: Stable cardiomediastinal silhouette with mild  cardiomegaly. No pneumothorax. Small to moderate bilateral pleural effusions, right greater than left, not significantly changed. Mild pulmonary edema. Bibasilar lung opacities. IMPRESSION: 1. Mild congestive heart failure. 2. Small to moderate bilateral pleural effusions, right greater than left. 3. Bibasilar lung opacities, favor atelectasis. Electronically Signed   By: Ilona Sorrel M.D.   On: 07/06/2018 16:33   Ct Chest Wo Contrast  Result Date: 07/06/2018 CLINICAL DATA:  85 year old male with shortness of breath. Hypoxic. Evidence of bilateral pleural effusions on portable chest. EXAM: CT CHEST WITHOUT CONTRAST TECHNIQUE: Multidetector CT imaging of the chest was performed following the standard protocol without IV contrast. COMPARISON:  Portable chest earlier today. FINDINGS: Cardiovascular: Calcified aortic atherosclerosis. Vascular patency is not evaluated in the absence of IV contrast. Calcified coronary artery atherosclerosis on series 2, image 86. Cardiac size at the upper limits of normal. No pericardial effusion. Mediastinum/Nodes: Mediastinal lymph nodes are within normal limits. No axillary lymphadenopathy. Lungs/Pleura: Moderate size bilateral layering pleural effusions. Simple fluid density suggesting transudate. Major airways are patent. There is mild generalized peribronchial thickening. Bilateral centrilobular emphysema. Confluent compressive atelectasis versus consolidation in both lower lobes, with air bronchograms greater on the right (series 2, image 91). Elsewhere there is occasional mild peribronchial nodularity which has a postinflammatory appearance. There is also some bilateral subpleural scarring. There is a small 11 millimeter area of ground-glass opacity in the right upper lobe on series 3, image 53. Upper Abdomen: Gallstone measuring about 22 millimeters. The visible gallbladder does not appear inflamed. Negative visible noncontrast liver, spleen, pancreas, adrenal glands, kidneys,  and bowel. Musculoskeletal: Ankylosis in portions of the thoracic spine. The upper thoracic levels and cervicothoracic junction appear spared. Osteopenia. No acute osseous abnormality identified. There are healed chronic fractures of the posterior lower left ribs. IMPRESSION: 1. Moderate size layering bilateral pleural effusions with compressive atelectasis versus pneumonia in both lower lobes. 2. Underlying Emphysema (ICD10-J43.9). Occasional mild peribronchial nodularity which has a postinflammatory appearance. Small 11 mm ground-glass opacity in the right upper lobe. Initial follow-up by chest CT without contrast is recommended in 3 months to confirm persistence. This recommendation follows the consensus statement: Recommendations for the Management of Subsolid Pulmonary Nodules Detected at CT: A Statement from the Fleischner Society as published in Radiology 2013; 266:304-317. 3. Calcified coronary artery and Aortic Atherosclerosis (ICD10-I70.0). 4. Cholelithiasis. Electronically Signed   By: Genevie Ann M.D.   On: 07/06/2018 20:28   Dg Chest Port 1 View  Result Date: 07/10/2018 CLINICAL DATA:  Post right-sided thoracentesis EXAM: PORTABLE CHEST 1 VIEW COMPARISON:  07/06/2018; chest CT-07/06/2018 FINDINGS: Grossly unchanged cardiac silhouette and mediastinal contours with atherosclerotic plaque within the thoracic aorta. Interval reduction in persistent trace right-sided effusion post thoracentesis. No pneumothorax. Unchanged small left-sided pleural effusion with associated left basilar opacities. Improved aeration of the right lung base. The pulmonary vasculature remains indistinct with cephalization of flow. No acute osseous abnormalities. IMPRESSION: 1. Interval reduction in persistent trace right-sided effusion post thoracentesis. No pneumothorax. 2. Similar findings of cardiomegaly, pulmonary edema, small left-sided effusion and bibasilar opacities, likely atelectasis. Electronically Signed   By: Sandi Mariscal M.D.   On: 07/10/2018  12:54   Dg Chest Port 1 View  Result Date: 07/02/2018 CLINICAL DATA:  Cough EXAM: PORTABLE CHEST 1 VIEW COMPARISON:  06/09/2018 FINDINGS: There are new small to moderate bilateral layering pleural effusions with similar mild, diffuse interstitial pulmonary opacity. There is no new focal airspace opacity. Cardiomegaly. IMPRESSION: There are new small to moderate bilateral layering pleural effusions with similar mild, diffuse interstitial pulmonary opacity. There is no new focal airspace opacity. Cardiomegaly. Electronically Signed   By: Eddie Candle M.D.   On: 07/02/2018 12:28   US Thoracentesis Asp Pleural Space W/img Guide  Result Date: 07/10/2018 INDICATION: History of recurrent congestive heart failure now with symptomatic right-sided pleural effusion. Please perform ultrasound-guided thoracentesis for diagnostic and therapeutic purposes. EXAM: US THORACENTESIS ASP PLEURAL SPACE W/IMG GUIDE COMPARISON:  Chest CT-07/06/2018; chest radiograph-07/06/2018 MEDICATIONS: None. COMPLICATIONS: None immediate. TECHNIQUE: Informed written consent was obtained from the patient after a discussion of the risks, benefits and alternatives to treatment. A timeout was performed prior to the initiation of the procedure. Initial ultrasound scanning demonstrates a moderate to large sized anechoic right-sided pleural effusion. The lower chest was prepped and draped in the usual sterile fashion. 1% lidocaine was used for local anesthesia. An ultrasound image was saved for documentation purposes. An 8 Fr Safe-T-Centesis catheter was introduced. The thoracentesis was performed. The catheter was removed and a dressing was applied. The patient tolerated the procedure well without immediate post procedural complication. The patient was escorted to have an upright chest radiograph. FINDINGS: A total of approximately 1.2 liters of serous fluid was removed. Requested samples were sent to the laboratory.  IMPRESSION: Successful ultrasound-guided right sided thoracentesis yielding 1.2 liters of pleural fluid. Electronically Signed   By: Sandi Mariscal M.D.   On: 07/10/2018 12:55    Assessment and Plan:   KIYAAN HAQ is a 85 y.o. y/o male was recently admitted with a HB 3.6, melena , EGD+colonoscopy showed no lesion to explain the blood loss. Was transfused , stable and discharged.   Capsule study was also negative.  Plan  1. CBC, iron studies, b12,folate today  2. CBC in 2 weeks   Dr Jonathon Bellows  MD,MRCP [facility]) Follow up in  6 weeks virtual visit

## 2018-07-16 ENCOUNTER — Other Ambulatory Visit: Payer: Self-pay | Admitting: Family Medicine

## 2018-07-16 ENCOUNTER — Encounter: Payer: Self-pay | Admitting: Gastroenterology

## 2018-07-16 DIAGNOSIS — E1122 Type 2 diabetes mellitus with diabetic chronic kidney disease: Secondary | ICD-10-CM | POA: Diagnosis not present

## 2018-07-16 DIAGNOSIS — L03115 Cellulitis of right lower limb: Secondary | ICD-10-CM | POA: Diagnosis not present

## 2018-07-16 DIAGNOSIS — I872 Venous insufficiency (chronic) (peripheral): Secondary | ICD-10-CM | POA: Diagnosis not present

## 2018-07-16 DIAGNOSIS — I129 Hypertensive chronic kidney disease with stage 1 through stage 4 chronic kidney disease, or unspecified chronic kidney disease: Secondary | ICD-10-CM | POA: Diagnosis not present

## 2018-07-16 DIAGNOSIS — J449 Chronic obstructive pulmonary disease, unspecified: Secondary | ICD-10-CM | POA: Diagnosis not present

## 2018-07-16 DIAGNOSIS — D62 Acute posthemorrhagic anemia: Secondary | ICD-10-CM | POA: Diagnosis not present

## 2018-07-16 DIAGNOSIS — N183 Chronic kidney disease, stage 3 (moderate): Secondary | ICD-10-CM | POA: Diagnosis not present

## 2018-07-16 DIAGNOSIS — K921 Melena: Secondary | ICD-10-CM | POA: Diagnosis not present

## 2018-07-16 DIAGNOSIS — R9389 Abnormal findings on diagnostic imaging of other specified body structures: Secondary | ICD-10-CM

## 2018-07-16 DIAGNOSIS — B9689 Other specified bacterial agents as the cause of diseases classified elsewhere: Secondary | ICD-10-CM | POA: Diagnosis not present

## 2018-07-16 LAB — CBC WITH DIFFERENTIAL
Basophils Absolute: 0.1 10*3/uL (ref 0.0–0.2)
Basos: 1 %
EOS (ABSOLUTE): 0.3 10*3/uL (ref 0.0–0.4)
Eos: 5 %
Hematocrit: 22.5 % — ABNORMAL LOW (ref 37.5–51.0)
Hemoglobin: 7.3 g/dL — ABNORMAL LOW (ref 13.0–17.7)
Immature Grans (Abs): 0.1 10*3/uL (ref 0.0–0.1)
Immature Granulocytes: 1 %
Lymphocytes Absolute: 1.3 10*3/uL (ref 0.7–3.1)
Lymphs: 21 %
MCH: 29.1 pg (ref 26.6–33.0)
MCHC: 32.4 g/dL (ref 31.5–35.7)
MCV: 90 fL (ref 79–97)
Monocytes Absolute: 0.8 10*3/uL (ref 0.1–0.9)
Monocytes: 14 %
Neutrophils Absolute: 3.6 10*3/uL (ref 1.4–7.0)
Neutrophils: 58 %
RBC: 2.51 x10E6/uL — CL (ref 4.14–5.80)
RDW: 16.4 % — ABNORMAL HIGH (ref 11.6–15.4)
WBC: 6.1 10*3/uL (ref 3.4–10.8)

## 2018-07-16 LAB — COMPREHENSIVE METABOLIC PANEL
ALT: 21 IU/L (ref 0–44)
AST: 20 IU/L (ref 0–40)
Albumin/Globulin Ratio: 1.1 — ABNORMAL LOW (ref 1.2–2.2)
Albumin: 3 g/dL — ABNORMAL LOW (ref 3.6–4.6)
Alkaline Phosphatase: 57 IU/L (ref 39–117)
BUN/Creatinine Ratio: 15 (ref 10–24)
BUN: 30 mg/dL — ABNORMAL HIGH (ref 8–27)
Bilirubin Total: 0.3 mg/dL (ref 0.0–1.2)
CO2: 33 mmol/L — ABNORMAL HIGH (ref 20–29)
Calcium: 7.6 mg/dL — ABNORMAL LOW (ref 8.6–10.2)
Chloride: 95 mmol/L — ABNORMAL LOW (ref 96–106)
Creatinine, Ser: 2.03 mg/dL — ABNORMAL HIGH (ref 0.76–1.27)
GFR calc Af Amer: 34 mL/min/{1.73_m2} — ABNORMAL LOW (ref >59)
GFR calc non Af Amer: 29 mL/min/{1.73_m2} — ABNORMAL LOW (ref >59)
Globulin, Total: 2.7 g/dL (ref 1.5–4.5)
Glucose: 137 mg/dL — ABNORMAL HIGH (ref 65–99)
Potassium: 3.7 mmol/L (ref 3.5–5.2)
Sodium: 140 mmol/L (ref 134–144)
Total Protein: 5.7 g/dL — ABNORMAL LOW (ref 6.0–8.5)

## 2018-07-16 LAB — IRON,TIBC AND FERRITIN PANEL
Ferritin: 535 ng/mL — ABNORMAL HIGH (ref 30–400)
Iron Saturation: 21 % (ref 15–55)
Iron: 36 ug/dL — ABNORMAL LOW (ref 38–169)
Total Iron Binding Capacity: 172 ug/dL — ABNORMAL LOW (ref 250–450)
UIBC: 136 ug/dL (ref 111–343)

## 2018-07-16 LAB — B12 AND FOLATE PANEL
Folate: 4.3 ng/mL (ref >3.0)
Vitamin B-12: 755 pg/mL (ref 232–1245)

## 2018-07-16 NOTE — Progress Notes (Signed)
Inform hb stable repeat in 2-3 weeks,watch for black stools

## 2018-07-18 ENCOUNTER — Telehealth: Payer: Self-pay

## 2018-07-18 NOTE — Telephone Encounter (Signed)
-----   Message from Jonathon Bellows, MD sent at 07/16/2018 12:45 PM EDT ----- Inform hb stable repeat in 2-3 weeks,watch for black stools

## 2018-07-18 NOTE — Telephone Encounter (Signed)
Spoke with pt daughter, [name], and informed her of pt lab results and Dr. Georgeann Oppenheim instructions.

## 2018-07-19 ENCOUNTER — Other Ambulatory Visit: Payer: Self-pay | Admitting: *Deleted

## 2018-07-19 ENCOUNTER — Encounter: Payer: Self-pay | Admitting: *Deleted

## 2018-07-19 DIAGNOSIS — N183 Chronic kidney disease, stage 3 (moderate): Secondary | ICD-10-CM | POA: Diagnosis not present

## 2018-07-19 DIAGNOSIS — E1122 Type 2 diabetes mellitus with diabetic chronic kidney disease: Secondary | ICD-10-CM | POA: Diagnosis not present

## 2018-07-19 DIAGNOSIS — L03115 Cellulitis of right lower limb: Secondary | ICD-10-CM | POA: Diagnosis not present

## 2018-07-19 DIAGNOSIS — I129 Hypertensive chronic kidney disease with stage 1 through stage 4 chronic kidney disease, or unspecified chronic kidney disease: Secondary | ICD-10-CM | POA: Diagnosis not present

## 2018-07-19 DIAGNOSIS — I872 Venous insufficiency (chronic) (peripheral): Secondary | ICD-10-CM | POA: Diagnosis not present

## 2018-07-19 DIAGNOSIS — K921 Melena: Secondary | ICD-10-CM | POA: Diagnosis not present

## 2018-07-19 DIAGNOSIS — J449 Chronic obstructive pulmonary disease, unspecified: Secondary | ICD-10-CM | POA: Diagnosis not present

## 2018-07-19 DIAGNOSIS — B9689 Other specified bacterial agents as the cause of diseases classified elsewhere: Secondary | ICD-10-CM | POA: Diagnosis not present

## 2018-07-19 DIAGNOSIS — D62 Acute posthemorrhagic anemia: Secondary | ICD-10-CM | POA: Diagnosis not present

## 2018-07-19 NOTE — Patient Outreach (Signed)
[facility]) [place] Telephone Outreach PCP office completes Transition of Care follow up post-hospital discharge Post-hospital discharge day # 9  07/19/2018  Arthur [name] 01-19-1933 [id number]  Successful telephone outreach to Arthur [name], daughter/ caregiver/ HCPOA, on [facility] DPR, for Arthur [name], 85 y/o male referred to [place] by inpatient RN CM during recent hospital visit July 4-8, 2020 for acute on chronic respiratory failure with hypoxia secondary to acute on chronic CHF; patient was noted to have (R) pleural effusion and had thoracentesis during hospital visit with 1.2 liters of fluid removed.  Patient was discharged home to self-care with ongoing home health services through [facility].  Patient has history including, but not limited to, DM; HTN/ HLD; obesity; COPD, on home O2; anemia of unknown origin; chronic respiratory failure; CKD- III.  HIPAA/ identity verified with caregiver today.  Pleasant 50 minute phone call.  Today, caregiver reports that patient "seems much better" and she confirms that patient is not in pain and has had no new/ recent falls; states patient continues using walker for all ambulation.  States patient's spirits "seem higher than when he first came home from hospital," and states that he has been singing at his table like he used to do before he got sick recently.  Caregiver confirms that she continues visiting with patient several times per day and continues preparing his meals for him.  Caregiver further reports:  -- attended recent PCP office visit 07/15/2018- insulin dosing was adjusted around recently reported hypoglycemic episodes at home post-hospital discharge:  Caregiver is able to accurately verbalize changes made to insulin dosing and confirms that she has initiated changes; denies further changes to patient's medications -- attended recent GI provider office visit and "was released" to PCP for  ongoing follow up for patient's anemia; reviewed labs from recent office visits with caregiver; discussed with caregiver both signs/ symptoms hypoglycemia and GI bleeding, along with corresponding action plan for both -- reviewed with caregiver blood sugars this week, and confirmed that patient has only had one low reading this week: "57" reported as fasting blood sugar post- recent PCP office visit; resolved with feeding patient; caregiver reports blood sugars mainly running "100-200's." Continues monitoring blood sugars 4 times per day; caregiver understands to contact PCP office next week with this week's home readings/ values; reports fasting blood sugar this morning as "143"   -- no concerns/ issues/ problems with patient's breathing status: continues using home O2 at 2 L/min; we discussed signs/ symptoms CHF yellow zone along with corresponding action plan.  Caregiver reports that she is going this afternoon to buy patient new scales, as his are old; has not been consistently monitoring daily weights due to scales being old-- we discussed rationale for daily weight monitoring and weight gain guidelines in setting of CHF, along with corresponding action plan; I encouraged caregiver to review printed educational material sent to her today, and to promptly begin daily weight monitoring, and she verbalizes agreement and understanding. 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  Positive reinforcement provided to caregiver for starting to think about this proactively.  Caregiver denies further issues, concerns, or problems today. I confirmed that she hasmy direct phone number, the main [facility] CM office phone number, and the [facility] CM 24-hour nurse advice phone number should issues arise prior to next scheduled [place] outreach in 2 weeks.  Encouraged caregiver to contact me directly if needs, questions, issues, or concerns arise prior to next scheduled outreach; patient agreed to do so.  Plan:  Patient will take medications as prescribed and will attend all scheduled provider appointments  Patient will promptly notify care providers for any new concerns/ issues/ problems that arise  Patient will actively participate in home health services as ordered post-hospital discharge  Patient will continue monitoring/ recording daily blood sugars 4 times per day  Patient will resume daily weight monitoring and recording at home  I will place printed educational material in mail to patient around self-health management of chronic disease states of DM and CHF  I will share today's notes/ care plan with patient's PCP to make aware of patient's hypoglycemic episode today and need for clarification of long and short acting insulin orders  [facility] CM outreach to continue with scheduled phone call to caregiver in 2weeks  [facility] CM Care Plan Problem One     Most Recent Value  Care Plan Problem One  High Risk for hospital readmission, related to/ as evidenced by multiple recent hospitalizations for multiple chronic conditions  Role Documenting the Problem One  Care Management Coordinator  Care Plan for Problem One  Active  [facility] Long Term Goal   Over the next 31 days, patient will not experience hospital readmission, as evidenced by patient/ caregiver reporting and review of EMR during  [facility] RN CM outreach  [facility] Long Term Goal Start Date  07/12/18  Interventions for Problem One Long Term Goal  Discussed overall patient clinical condition with patient's caregiver and confirmed that she believes patient has improved and confirmed that she has no current clinical concerns,  encouraged caregiver to promptly notify care providers for any new concerns/ issues/ problems and confirmed that she has phone numbers for care providers  [facility] CM Short Term Goal #1   Over the next 30 days, patient will attend all scheduled provider appointments as evidenced by patient/ caregiver reporting and review of EMR during [place] RN CM outreach  [facility] CM Short Term Goal #1 Start Date  07/12/18  Interventions for Short Term Goal #1  Confirmed that patient attended recent post-hospital discharge scheduled PCP office visit and reviewed visit, lab work, and changes made to patient's insulin dosing,  reviewed upcoming provider office visits with caregiver and confirmed that she will continue to provide transportation for patient and will attend all visits with patient  [facility] CM Short Term Goal #2   Over the next 30 days, patient will actively participate in home health services as ordered post-hospital discharge, as evidenced by patient/ caregiver reporting and collaboration with home health team as indicated during [place] RN CM outreach  [facility] CM Short Term Goal #2 Start Date  07/12/18  Interventions for Short Term Goal #2  Reviewed recent home health discipline visits with caregiver and confirmed that she has maintained contact with home health team,  discussed process to have these services extended if necessary  [facility] CM Care Plan Problem Two     Most Recent Value  Care Plan Problem Two  Self-health management of chronic disease state of CHF, as evidenced by recent hospitalization for CHF exacerbation and caregiver reporting   Role Documenting the Problem Two  Care Management Coordinator  Care Plan for  Problem Two  Active  Interventions for Problem Two Long Term Goal   Reviewed with caregiver need to promptly initiate daily weights post-hospital discharge,  provided education around rationale for daily weights and weight gain guidelines in setting of CHF,  reviewed recent post-hospital discharge weights with caregiver and mailed patient/ caregiver printed educational material around daily weights in CHF  [facility] Long Term Goal  Over the next 31 days, patient will monitor and record daily weights, as evidenced by review of same/ caregiver reporting during [place] outreach  [place] Term Goal Start Date  07/19/18     Oneta Rack, RN, BSN, [place] [facility]  [phone]

## 2018-07-20 DIAGNOSIS — I129 Hypertensive chronic kidney disease with stage 1 through stage 4 chronic kidney disease, or unspecified chronic kidney disease: Secondary | ICD-10-CM | POA: Diagnosis not present

## 2018-07-20 DIAGNOSIS — B9689 Other specified bacterial agents as the cause of diseases classified elsewhere: Secondary | ICD-10-CM | POA: Diagnosis not present

## 2018-07-20 DIAGNOSIS — N183 Chronic kidney disease, stage 3 (moderate): Secondary | ICD-10-CM | POA: Diagnosis not present

## 2018-07-20 DIAGNOSIS — K921 Melena: Secondary | ICD-10-CM | POA: Diagnosis not present

## 2018-07-20 DIAGNOSIS — I872 Venous insufficiency (chronic) (peripheral): Secondary | ICD-10-CM | POA: Diagnosis not present

## 2018-07-20 DIAGNOSIS — E1122 Type 2 diabetes mellitus with diabetic chronic kidney disease: Secondary | ICD-10-CM | POA: Diagnosis not present

## 2018-07-20 DIAGNOSIS — J449 Chronic obstructive pulmonary disease, unspecified: Secondary | ICD-10-CM | POA: Diagnosis not present

## 2018-07-20 DIAGNOSIS — D62 Acute posthemorrhagic anemia: Secondary | ICD-10-CM | POA: Diagnosis not present

## 2018-07-20 DIAGNOSIS — L03115 Cellulitis of right lower limb: Secondary | ICD-10-CM | POA: Diagnosis not present

## 2018-07-22 DIAGNOSIS — I872 Venous insufficiency (chronic) (peripheral): Secondary | ICD-10-CM | POA: Diagnosis not present

## 2018-07-22 DIAGNOSIS — K921 Melena: Secondary | ICD-10-CM | POA: Diagnosis not present

## 2018-07-22 DIAGNOSIS — L03115 Cellulitis of right lower limb: Secondary | ICD-10-CM | POA: Diagnosis not present

## 2018-07-22 DIAGNOSIS — E1122 Type 2 diabetes mellitus with diabetic chronic kidney disease: Secondary | ICD-10-CM | POA: Diagnosis not present

## 2018-07-22 DIAGNOSIS — N183 Chronic kidney disease, stage 3 (moderate): Secondary | ICD-10-CM | POA: Diagnosis not present

## 2018-07-22 DIAGNOSIS — B9689 Other specified bacterial agents as the cause of diseases classified elsewhere: Secondary | ICD-10-CM | POA: Diagnosis not present

## 2018-07-22 DIAGNOSIS — J449 Chronic obstructive pulmonary disease, unspecified: Secondary | ICD-10-CM | POA: Diagnosis not present

## 2018-07-22 DIAGNOSIS — I129 Hypertensive chronic kidney disease with stage 1 through stage 4 chronic kidney disease, or unspecified chronic kidney disease: Secondary | ICD-10-CM | POA: Diagnosis not present

## 2018-07-22 DIAGNOSIS — D62 Acute posthemorrhagic anemia: Secondary | ICD-10-CM | POA: Diagnosis not present

## 2018-07-23 DIAGNOSIS — K921 Melena: Secondary | ICD-10-CM | POA: Diagnosis not present

## 2018-07-23 DIAGNOSIS — E1129 Type 2 diabetes mellitus with other diabetic kidney complication: Secondary | ICD-10-CM | POA: Diagnosis not present

## 2018-07-23 DIAGNOSIS — I129 Hypertensive chronic kidney disease with stage 1 through stage 4 chronic kidney disease, or unspecified chronic kidney disease: Secondary | ICD-10-CM | POA: Diagnosis not present

## 2018-07-23 DIAGNOSIS — N183 Chronic kidney disease, stage 3 (moderate): Secondary | ICD-10-CM | POA: Diagnosis not present

## 2018-07-23 DIAGNOSIS — J449 Chronic obstructive pulmonary disease, unspecified: Secondary | ICD-10-CM | POA: Diagnosis not present

## 2018-07-23 DIAGNOSIS — D62 Acute posthemorrhagic anemia: Secondary | ICD-10-CM | POA: Diagnosis not present

## 2018-07-23 DIAGNOSIS — B9689 Other specified bacterial agents as the cause of diseases classified elsewhere: Secondary | ICD-10-CM | POA: Diagnosis not present

## 2018-07-23 DIAGNOSIS — N179 Acute kidney failure, unspecified: Secondary | ICD-10-CM | POA: Diagnosis not present

## 2018-07-23 DIAGNOSIS — I872 Venous insufficiency (chronic) (peripheral): Secondary | ICD-10-CM | POA: Diagnosis not present

## 2018-07-23 DIAGNOSIS — L0390 Cellulitis, unspecified: Secondary | ICD-10-CM | POA: Diagnosis not present

## 2018-07-23 DIAGNOSIS — E1122 Type 2 diabetes mellitus with diabetic chronic kidney disease: Secondary | ICD-10-CM | POA: Diagnosis not present

## 2018-07-23 DIAGNOSIS — R600 Localized edema: Secondary | ICD-10-CM | POA: Diagnosis not present

## 2018-07-23 DIAGNOSIS — L03115 Cellulitis of right lower limb: Secondary | ICD-10-CM | POA: Diagnosis not present

## 2018-07-24 ENCOUNTER — Inpatient Hospital Stay
Admission: EM | Admit: 2018-07-24 | Discharge: 2018-07-26 | DRG: 379 | Disposition: A | Discharge status: Home Health | Payer: Medicare HMO | Attending: Internal Medicine | Admitting: Internal Medicine

## 2018-07-24 ENCOUNTER — Encounter: Payer: Self-pay | Admitting: Intensive Care

## 2018-07-24 ENCOUNTER — Other Ambulatory Visit: Payer: Self-pay

## 2018-07-24 DIAGNOSIS — Z66 Do not resuscitate: Secondary | ICD-10-CM | POA: Diagnosis present

## 2018-07-24 DIAGNOSIS — F17200 Nicotine dependence, unspecified, uncomplicated: Secondary | ICD-10-CM | POA: Diagnosis present

## 2018-07-24 DIAGNOSIS — Z885 Allergy status to narcotic agent status: Secondary | ICD-10-CM

## 2018-07-24 DIAGNOSIS — Z1159 Encounter for screening for other viral diseases: Secondary | ICD-10-CM

## 2018-07-24 DIAGNOSIS — D631 Anemia in chronic kidney disease: Secondary | ICD-10-CM | POA: Diagnosis not present

## 2018-07-24 DIAGNOSIS — I129 Hypertensive chronic kidney disease with stage 1 through stage 4 chronic kidney disease, or unspecified chronic kidney disease: Secondary | ICD-10-CM | POA: Diagnosis present

## 2018-07-24 DIAGNOSIS — E785 Hyperlipidemia, unspecified: Secondary | ICD-10-CM | POA: Diagnosis present

## 2018-07-24 DIAGNOSIS — K922 Gastrointestinal hemorrhage, unspecified: Principal | ICD-10-CM | POA: Diagnosis present

## 2018-07-24 DIAGNOSIS — J449 Chronic obstructive pulmonary disease, unspecified: Secondary | ICD-10-CM | POA: Diagnosis present

## 2018-07-24 DIAGNOSIS — Z794 Long term (current) use of insulin: Secondary | ICD-10-CM

## 2018-07-24 DIAGNOSIS — I10 Essential (primary) hypertension: Secondary | ICD-10-CM | POA: Diagnosis not present

## 2018-07-24 DIAGNOSIS — N400 Enlarged prostate without lower urinary tract symptoms: Secondary | ICD-10-CM | POA: Diagnosis present

## 2018-07-24 DIAGNOSIS — E119 Type 2 diabetes mellitus without complications: Secondary | ICD-10-CM | POA: Diagnosis not present

## 2018-07-24 DIAGNOSIS — Z79899 Other long term (current) drug therapy: Secondary | ICD-10-CM

## 2018-07-24 DIAGNOSIS — Z20828 Contact with and (suspected) exposure to other viral communicable diseases: Secondary | ICD-10-CM | POA: Diagnosis not present

## 2018-07-24 DIAGNOSIS — D649 Anemia, unspecified: Secondary | ICD-10-CM

## 2018-07-24 DIAGNOSIS — Z825 Family history of asthma and other chronic lower respiratory diseases: Secondary | ICD-10-CM | POA: Diagnosis not present

## 2018-07-24 DIAGNOSIS — N183 Chronic kidney disease, stage 3 (moderate): Secondary | ICD-10-CM | POA: Diagnosis not present

## 2018-07-24 DIAGNOSIS — E1122 Type 2 diabetes mellitus with diabetic chronic kidney disease: Secondary | ICD-10-CM | POA: Diagnosis not present

## 2018-07-24 DIAGNOSIS — R0602 Shortness of breath: Secondary | ICD-10-CM | POA: Diagnosis not present

## 2018-07-24 HISTORY — DX: Chronic kidney disease, stage 3 unspecified: N18.30

## 2018-07-24 LAB — BASIC METABOLIC PANEL
Anion gap: 7 (ref 5–15)
BUN: 26 mg/dL — ABNORMAL HIGH (ref 8–23)
CO2: 35 mmol/L — ABNORMAL HIGH (ref 22–32)
Calcium: 8.4 mg/dL — ABNORMAL LOW (ref 8.9–10.3)
Chloride: 96 mmol/L — ABNORMAL LOW (ref 98–111)
Creatinine, Ser: 1.96 mg/dL — ABNORMAL HIGH (ref 0.61–1.24)
GFR calc Af Amer: 36 mL/min — ABNORMAL LOW (ref >60)
GFR calc non Af Amer: 31 mL/min — ABNORMAL LOW (ref >60)
Glucose, Bld: 89 mg/dL (ref 70–99)
Potassium: 3.6 mmol/L (ref 3.5–5.1)
Sodium: 138 mmol/L (ref 135–145)

## 2018-07-24 LAB — CBC WITH DIFFERENTIAL/PLATELET
Abs Immature Granulocytes: 0.02 10*3/uL (ref 0.00–0.07)
Basophils Absolute: 0.1 10*3/uL (ref 0.0–0.1)
Basophils Relative: 1 %
Eosinophils Absolute: 0.3 10*3/uL (ref 0.0–0.5)
Eosinophils Relative: 4 %
HCT: 24 % — ABNORMAL LOW (ref 39.0–52.0)
Hemoglobin: 7.3 g/dL — ABNORMAL LOW (ref 13.0–17.0)
Immature Granulocytes: 0 %
Lymphocytes Relative: 26 %
Lymphs Abs: 1.5 10*3/uL (ref 0.7–4.0)
MCH: 28.7 pg (ref 26.0–34.0)
MCHC: 30.4 g/dL (ref 30.0–36.0)
MCV: 94.5 fL (ref 80.0–100.0)
Monocytes Absolute: 0.5 10*3/uL (ref 0.1–1.0)
Monocytes Relative: 9 %
Neutro Abs: 3.6 10*3/uL (ref 1.7–7.7)
Neutrophils Relative %: 60 %
Platelets: 240 10*3/uL (ref 150–400)
RBC: 2.54 MIL/uL — ABNORMAL LOW (ref 4.22–5.81)
RDW: 18.6 % — ABNORMAL HIGH (ref 11.5–15.5)
WBC: 6 10*3/uL (ref 4.0–10.5)
nRBC: 0 % (ref 0.0–0.2)

## 2018-07-24 MED ORDER — PANTOPRAZOLE SODIUM 40 MG IV SOLR: 40.0000 mg | Freq: Two times a day (BID) | INTRAVENOUS | Status: DC

## 2018-07-24 MED ORDER — SODIUM CHLORIDE 0.9 % IV SOLN
80.0000 mg | Freq: Once | INTRAVENOUS | Status: AC
  Administered 2018-07-24: 80 mg via INTRAVENOUS
  Filled 2018-07-24: qty 80

## 2018-07-24 MED ORDER — SODIUM CHLORIDE 0.9 % IV SOLN
8.0000 mg/h | INTRAVENOUS | Status: DC
  Administered 2018-07-24: 8 mg/h via INTRAVENOUS
  Administered 2018-07-25: 6.4 mg/h via INTRAVENOUS
  Administered 2018-07-25 – 2018-07-26 (×2): 8 mg/h via INTRAVENOUS
  Filled 2018-07-24 (×4): qty 80

## 2018-07-24 NOTE — ED Notes (Signed)
Patient was sent by PCP for hemoglobin level. MD at bedside to perform occult test and blood present. Spoke with daughter [name] to update her and let her know he would be admitted.

## 2018-07-24 NOTE — ED Notes (Signed)
ED TO INPATIENT HANDOFF REPORT  ED Nurse Name and Phone #:  Starlyn Skeans #[phone]  S Name/Age/Gender Arthur [name] 85 y.o. male Room/Bed: ED07A/ED07A  Code Status   Code Status: Prior  Home/SNF/Other Home Patient oriented to: self, place, time and situation Is this baseline? Yes   Triage Complete: Triage complete  Chief Complaint referred by Dr. Teola Bradley labs  Triage Note Per Dr. Edwina Barth, pt sent to the ED for Hbg 6.4, recently had GO workup that did not show any bleeding and had a blood transfusion.  Patient reports having blood drawn at his doctors office yesterday and received a phone call today to come to ER due to low blood count. Patient denies any symptoms. Reports having two blood transfusions in the past couple of weeks. Wears 2L O2 chronically   Allergies Allergies  Allergen Reactions  . Codeine Other (See Comments)    Constipation    Level of Care/Admitting Diagnosis ED Disposition    ED Disposition Condition Comment   Admit  Hospital Area: [place] [[id number]]  Level of Care: Med-Surg [16]  Covid Evaluation: Asymptomatic Screening Protocol (No Symptoms)  Diagnosis: GI bleeding [960454]  Admitting Physician: Lance Coon [[id number]]  Attending Physician: Lance Coon [phone]  Estimated length of stay: past midnight tomorrow  Certification:: I certify this patient will need inpatient services for at least 2 midnights  PT Class (Do Not Modify): Inpatient [101]  PT Acc Code (Do Not Modify): Private [1]       B Medical/Surgery History Past Medical History:  Diagnosis Date  . COPD (chronic obstructive pulmonary disease) ([place])   . Diabetes mellitus without complication ([place])   . Hyperlipidemia   . Hypertension    Past Surgical History:  Procedure Laterality Date  . COLONOSCOPY WITH PROPOFOL N/A 06/11/2018   Procedure: COLONOSCOPY WITH PROPOFOL;  Surgeon: Jonathon Bellows, MD;  Location: [facility] ENDOSCOPY;  Service:  Gastroenterology;  Laterality: N/A;  . COLONOSCOPY WITH PROPOFOL N/A 06/13/2018   Procedure: COLONOSCOPY WITH PROPOFOL;  Surgeon: Jonathon Bellows, MD;  Location: [facility] ENDOSCOPY;  Service: Gastroenterology;  Laterality: N/A;  . ESOPHAGOGASTRODUODENOSCOPY Left 06/10/2018   Procedure: ESOPHAGOGASTRODUODENOSCOPY (EGD);  Surgeon: Jonathon Bellows, MD;  Location: [facility] ENDOSCOPY;  Service: Gastroenterology;  Laterality: Left;  . GIVENS CAPSULE STUDY N/A 06/28/2018   Procedure: GIVENS CAPSULE STUDY;  Surgeon: Lucilla Lame, MD;  Location: [facility] ENDOSCOPY;  Service: Endoscopy;  Laterality: N/A;  . GIVENS CAPSULE STUDY N/A 06/30/2018   Procedure: GIVENS CAPSULE STUDY;  Surgeon: Jonathon Bellows, MD;  Location: [facility] ENDOSCOPY;  Service: Gastroenterology;  Laterality: N/A;     A IV Location/Drains/Wounds Patient Lines/Drains/Airways Status   Active Line/Drains/Airways    Name:   Placement date:   Placement time:   Site:   Days:   Peripheral IV 07/06/18 Posterior;Right Wrist   07/06/18    -    Wrist   18   Peripheral IV 07/24/18 Right Antecubital   07/24/18    2102    Antecubital   less than 1   External Urinary Catheter   07/06/18    2119    -   18   Airway   06/10/18    0944     44          Intake/Output Last 24 hours No intake or output data in the 24 hours ending 07/24/18 2228  Labs/Imaging Results for orders placed or performed during the hospital encounter of 07/24/18 (from the past 48 hour(s))  CBC with Differential  Status: Abnormal   Collection Time: 07/24/18  6:20 PM  Result Value Ref Range   WBC 6.0 4.0 - 10.5 K/uL   RBC 2.54 (L) 4.22 - 5.81 MIL/uL   Hemoglobin 7.3 (L) 13.0 - 17.0 g/dL   HCT 24.0 (L) 39.0 - 52.0 %   MCV 94.5 80.0 - 100.0 fL   MCH 28.7 26.0 - 34.0 pg   MCHC 30.4 30.0 - 36.0 g/dL   RDW 18.6 (H) 11.5 - 15.5 %   Platelets 240 150 - 400 K/uL   nRBC 0.0 0.0 - 0.2 %   Neutrophils Relative % 60 %   Neutro Abs 3.6 1.7 - 7.7 K/uL   Lymphocytes Relative 26 %   Lymphs Abs 1.5 0.7 - 4.0  K/uL   Monocytes Relative 9 %   Monocytes Absolute 0.5 0.1 - 1.0 K/uL   Eosinophils Relative 4 %   Eosinophils Absolute 0.3 0.0 - 0.5 K/uL   Basophils Relative 1 %   Basophils Absolute 0.1 0.0 - 0.1 K/uL   Immature Granulocytes 0 %   Abs Immature Granulocytes 0.02 0.00 - 0.07 K/uL    Comment: Performed at [facility], [address] [place], [place] [address]  Basic metabolic panel     Status: Abnormal   Collection Time: 07/24/18  6:20 PM  Result Value Ref Range   Sodium 138 135 - 145 mmol/L   Potassium 3.6 3.5 - 5.1 mmol/L   Chloride 96 (L) 98 - 111 mmol/L   CO2 35 (H) 22 - 32 mmol/L   Glucose, Bld 89 70 - 99 mg/dL   BUN 26 (H) 8 - 23 mg/dL   Creatinine, Ser 1.96 (H) 0.61 - 1.24 mg/dL   Calcium 8.4 (L) 8.9 - 10.3 mg/dL   GFR calc non Af Amer 31 (L) >60 mL/min   GFR calc Af Amer 36 (L) >60 mL/min   Anion gap 7 5 - 15    Comment: Performed at [facility], [address] [place], [place] [address]  Type and screen [place]     Status: None   Collection Time: 07/24/18  6:20 PM  Result Value Ref Range   ABO/RH(D) A POS    Antibody Screen NEG    Sample Expiration      07/27/2018,2359 Performed at [facility], [address] [place], [place] [address]    No results found.  Pending Labs Unresulted Labs (From admission, onward)    Start     Ordered   07/24/18 2048  Novel Coronavirus,NAA,(SEND-OUT TO REF LAB - TAT 24-48 hrs); Hosp Order  (Symptomatic Patients Labs with Precautions )  Once,   STAT    Question:  Current symptoms  Answer:  Other (testing not indicated)   07/24/18 2047   Signed and Held  CBC  (heparin)  Once,   R    Comments: Baseline for heparin therapy IF NOT ALREADY DRAWN.  Notify MD if PLT < 100 K.    Signed and Held   Signed and Held  Creatinine, serum  (heparin)  Once,   R    Comments: Baseline for heparin therapy IF NOT ALREADY DRAWN.    Signed and Held   Signed and Held  Basic metabolic panel   Tomorrow morning,   R     Signed and Held   Signed and Held  CBC  Tomorrow morning,   R     Signed and Held  Vitals/Pain Today's Vitals   07/24/18 1818 07/24/18 1819 07/24/18 2019 07/24/18 2130  BP: (!) 148/51  (!) 148/52 (!) 154/60  Pulse: 74   73  Resp: 18     Temp: 98.6 F (37 C)     TempSrc: Oral     SpO2: 100%   95%  Weight:  111.6 kg    Height:  5\' 8"  (1.727 m)    PainSc:  0-No pain      Isolation Precautions Airborne and Contact precautions  Medications Medications  pantoprazole (PROTONIX) 80 mg in sodium chloride 0.9 % 100 mL IVPB (80 mg Intravenous New Bag/Given 07/24/18 2211)  pantoprazole (PROTONIX) 80 mg in sodium chloride 0.9 % 250 mL (0.32 mg/mL) infusion (8 mg/hr Intravenous New Bag/Given 07/24/18 2212)  pantoprazole (PROTONIX) injection 40 mg (has no administration in time range)    Mobility walks Low fall risk        R Recommendations: See Admitting Provider Note  Report given to:   Additional Notes:  Always on 2L [place] that is baseline

## 2018-07-24 NOTE — H&P (Signed)
[facility] [place] NAME: Arthur [name]    MR#:  [id number]  DATE OF BIRTH:  November 14, 1933  DATE OF ADMISSION:  07/24/2018  PRIMARY CARE PHYSICIAN: Baxter Hire, MD   REQUESTING/REFERRING PHYSICIAN: Archie Balboa, MD  CHIEF COMPLAINT:   Chief Complaint  Patient presents with  . Abnormal Lab    HISTORY OF PRESENT ILLNESS:  Arthur [name]  is a 85 y.o. male who presents with chief complaint as above.  Patient presents to the ED on request by his PCP.  He had labs checked and his hemoglobin value was found to be 6.4.  Here in the ED it is 7.3.  Patient is guaiac positive on rectal exam in the ED.  He denies any significant bleeding in his stool or anywhere else.  He denies any abdominal pain.  Hospitalist were called for admission and further evaluation  PAST MEDICAL HISTORY:   Past Medical History:  Diagnosis Date  . COPD (chronic obstructive pulmonary disease) ([place])   . Diabetes mellitus without complication ([place])   . Hyperlipidemia   . Hypertension      PAST SURGICAL HISTORY:   Past Surgical History:  Procedure Laterality Date  . COLONOSCOPY WITH PROPOFOL N/A 06/11/2018   Procedure: COLONOSCOPY WITH PROPOFOL;  Surgeon: Jonathon Bellows, MD;  Location: [facility] ENDOSCOPY;  Service: Gastroenterology;  Laterality: N/A;  . COLONOSCOPY WITH PROPOFOL N/A 06/13/2018   Procedure: COLONOSCOPY WITH PROPOFOL;  Surgeon: Jonathon Bellows, MD;  Location: [facility] ENDOSCOPY;  Service: Gastroenterology;  Laterality: N/A;  . ESOPHAGOGASTRODUODENOSCOPY Left 06/10/2018   Procedure: ESOPHAGOGASTRODUODENOSCOPY (EGD);  Surgeon: Jonathon Bellows, MD;  Location: [facility] ENDOSCOPY;  Service: Gastroenterology;  Laterality: Left;  . GIVENS CAPSULE STUDY N/A 06/28/2018   Procedure: GIVENS CAPSULE STUDY;  Surgeon: Lucilla Lame, MD;  Location: [facility] ENDOSCOPY;  Service: Endoscopy;  Laterality: N/A;  . GIVENS CAPSULE STUDY N/A 06/30/2018   Procedure: GIVENS CAPSULE STUDY;  Surgeon: Jonathon Bellows,  MD;  Location: [facility] ENDOSCOPY;  Service: Gastroenterology;  Laterality: N/A;     SOCIAL HISTORY:   Social History   Tobacco Use  . Smoking status: Former Smoker    Packs/day: 2.00    Years: 35.00    Pack years: 70.00    Types: Cigarettes    Quit date: 11/04/1982    Years since quitting: 35.7  . Smokeless tobacco: Current User  Substance Use Topics  . Alcohol use: Not Currently     FAMILY HISTORY:   Family History  Problem Relation Age of Onset  . COPD Mother   . Cancer Father      DRUG ALLERGIES:   Allergies  Allergen Reactions  . Codeine Other (See Comments)    Constipation    MEDICATIONS AT HOME:   Prior to Admission medications   Medication Sig Start Date End Date Taking? Authorizing Provider  amLODipine (NORVASC) 2.5 MG tablet Take 2.5 mg by mouth daily. 04/26/18  Yes [provider]  furosemide (LASIX) 20 MG tablet Take 1 tablet (20 mg total) by mouth daily. 07/05/18 08/04/18 Yes Pyreddy, Reatha Harps, MD  ipratropium-albuterol (DUONEB) 0.5-2.5 (3) MG/3ML SOLN Take 3 mLs by nebulization every 6 (six) hours as needed. 05/28/18  Yes Bettey Costa, MD  iron polysaccharides (NIFEREX) 150 MG capsule Take 1 capsule (150 mg total) by mouth daily. 07/11/18  Yes Fritzi Mandes, MD  LEVEMIR 100 UNIT/ML injection Inject 0.12 mLs (12 Units total) into the skin daily. 07/10/18  Yes Fritzi Mandes, MD  pantoprazole (PROTONIX) 40 MG tablet  Take 1 tablet (40 mg total) by mouth 2 (two) times daily. 06/14/18  Yes Lang Snow, NP  pravastatin (PRAVACHOL) 10 MG tablet Take 10 mg by mouth at bedtime. 04/25/18  Yes [provider]  tamsulosin (FLOMAX) 0.4 MG CAPS capsule Take 0.4 mg by mouth daily. 04/25/18  Yes [provider]  torsemide (DEMADEX) 20 MG tablet Take 20 mg by mouth 2 (two) times a day. 07/23/18  Yes [provider]  albuterol (VENTOLIN HFA) 108 (90 Base) MCG/ACT inhaler Inhale 2 puffs into the lungs every 6 (six) hours as needed for wheezing or  shortness of breath. 05/27/18   Demetrios Loll, MD  cefdinir (OMNICEF) 300 MG capsule Take 1 capsule (300 mg total) by mouth every 12 (twelve) hours. Patient not taking: Reported on 07/15/2018 07/10/18   Fritzi Mandes, MD  insulin aspart (NOVOLOG) 100 UNIT/ML injection Inject 20 Units into the skin 3 (three) times daily before meals.    [provider]  nystatin cream (MYCOSTATIN) Apply topically 2 (two) times daily. 05/28/18   Bettey Costa, MD    REVIEW OF SYSTEMS:  Review of Systems  Constitutional: Negative for chills, fever, malaise/fatigue and weight loss.  HENT: Negative for ear pain, hearing loss and tinnitus.   Eyes: Negative for blurred vision, double vision, pain and redness.  Respiratory: Negative for cough, hemoptysis and shortness of breath.   Cardiovascular: Negative for chest pain, palpitations, orthopnea and leg swelling.  Gastrointestinal: Negative for abdominal pain, constipation, diarrhea, nausea and vomiting.  Genitourinary: Negative for dysuria, frequency and hematuria.  Musculoskeletal: Negative for back pain, joint pain and neck pain.  Skin:       No acne, rash, or lesions  Neurological: Negative for dizziness, tremors, focal weakness and weakness.  Endo/Heme/Allergies: Negative for polydipsia. Does not bruise/bleed easily.  Psychiatric/Behavioral: Negative for depression. The patient is not nervous/anxious and does not have insomnia.      VITAL SIGNS:   Vitals:   07/24/18 1818 07/24/18 1819 07/24/18 2019 07/24/18 2130  BP: (!) 148/51  (!) 148/52 (!) 154/60  Pulse: 74   73  Resp: 18     Temp: 98.6 F (37 C)     TempSrc: Oral     SpO2: 100%   95%  Weight:  111.6 kg    Height:  5\' 8"  (1.727 m)     Wt Readings from Last 3 Encounters:  07/24/18 111.6 kg  07/15/18 115.7 kg  07/10/18 120 kg    PHYSICAL EXAMINATION:  Physical Exam  Vitals reviewed. Constitutional: He is oriented to person, place, and time. He appears well-developed and well-nourished. No  distress.  HENT:  Head: Normocephalic and atraumatic.  Mouth/Throat: Oropharynx is clear and moist.  Eyes: Pupils are equal, round, and reactive to light. EOM are normal. No scleral icterus.  Conjunctival pallor  Neck: Normal range of motion. Neck supple. No JVD present. No thyromegaly present.  Cardiovascular: Normal rate, regular rhythm and intact distal pulses. Exam reveals no gallop and no friction rub.  No murmur heard. Respiratory: Effort normal and breath sounds normal. No respiratory distress. He has no wheezes. He has no rales.  GI: Soft. Bowel sounds are normal. He exhibits no distension. There is no abdominal tenderness.  Musculoskeletal: Normal range of motion.        General: No edema.     Comments: No arthritis, no gout  Lymphadenopathy:    He has no cervical adenopathy.  Neurological: He is alert and oriented to person, place, and  time. No cranial nerve deficit.  No dysarthria, no aphasia  Skin: Skin is warm and dry. No rash noted. No erythema.  Psychiatric: He has a normal mood and affect. His behavior is normal. Judgment and thought content normal.    LABORATORY PANEL:   CBC Recent Labs  Lab 07/24/18 1820  WBC 6.0  HGB 7.3*  HCT 24.0*  PLT 240   ------------------------------------------------------------------------------------------------------------------  Chemistries  Recent Labs  Lab 07/24/18 1820  NA 138  K 3.6  CL 96*  CO2 35*  GLUCOSE 89  BUN 26*  CREATININE 1.96*  CALCIUM 8.4*   ------------------------------------------------------------------------------------------------------------------  Cardiac Enzymes No results for input(s): TROPONINI in the last 168 hours. ------------------------------------------------------------------------------------------------------------------  RADIOLOGY:  No results found.  EKG:   Orders placed or performed during the hospital encounter of 07/06/18  . ED EKG  . ED EKG    IMPRESSION AND PLAN:   Principal Problem:   GI bleeding -suspect possible occult GI bleed due to the fact that he has persistent and recurrent anemia.  Apparently the patient had work-up for this recently with some inconclusive endoscopy.  We will admit him here tonight, and get a GI consult.  Recheck hemoglobin in the morning and transfuse if below 7 Active Problems:   Diabetes (HCC) -sliding scale insulin coverage   Essential hypertension -home dose antihypertensives   COPD (chronic obstructive pulmonary disease) (HCC) -home dose inhalers   HLD (hyperlipidemia) -home dose antilipid  Chart review performed and case discussed with ED provider. Labs, imaging and/or ECG reviewed by provider and discussed with patient/family. Management plans discussed with the patient and/or family.  COVID-19 status: Pending  DVT PROPHYLAXIS: Mechanical only  GI PROPHYLAXIS:  PPI   ADMISSION STATUS: Inpatient     CODE STATUS: DNR Code Status History    Date Active Date Inactive Code Status Order ID Comments User Context   07/06/2018 2054 07/10/2018 1959 DNR [id number]  Henreitta Leber, MD Inpatient   06/27/2018 1150 07/05/2018 1757 DNR [id number]  Lang Snow, NP Inpatient   06/09/2018 1350 06/14/2018 1730 DNR [id number]  Lang Snow, NP ED   05/23/2018 0919 05/28/2018 2024 DNR [id number]  Demetrios Loll, MD Inpatient   05/23/2018 0104 05/23/2018 0918 Full Code [id number]  Mayer Camel, NP Inpatient   Advance Care Planning Activity    Questions for Most Recent Historical Code Status (Order [id number])    Question Answer Comment   In the event of cardiac or respiratory ARREST Do not call a "code blue"    In the event of cardiac or respiratory ARREST Do not perform Intubation, CPR, defibrillation or ACLS    In the event of cardiac or respiratory ARREST Use medication by any route, position, wound care, and other measures to relive pain and suffering. May use oxygen, suction and manual treatment of airway obstruction as  needed for comfort.         Advance Directive Documentation     Most Recent Value  Type of Advance Directive  Living will, Healthcare Power of Attorney, Out of facility DNR (pink MOST or yellow form)  Pre-existing out of facility DNR order (yellow form or pink MOST form)  -  "MOST" Form in Place?  -      TOTAL TIME TAKING CARE OF THIS PATIENT: 45 minutes.   This patient was evaluated in the context of the global COVID-19 pandemic, which necessitated consideration that the patient might be at risk for infection with the SARS-CoV-2 virus that causes COVID-19. Institutional  protocols and algorithms that pertain to the evaluation of patients at risk for COVID-19 are in a state of rapid change based on information released by regulatory bodies including the CDC and federal and state organizations. These policies and algorithms were followed to the best of this provider's knowledge to date during the patient's care at this facility.  Ethlyn Daniels 07/24/2018, 10:27 PM  [facility]  Office  [phone]  CC: Primary care physician; Baxter Hire, MD  Note:  This document was prepared using Dragon voice recognition software and may include unintentional dictation errors.

## 2018-07-24 NOTE — ED Triage Notes (Signed)
Patient reports having blood drawn at his doctors office yesterday and received a phone call today to come to ER due to low blood count. Patient denies any symptoms. Reports having two blood transfusions in the past couple of weeks. Wears 2L O2 chronically

## 2018-07-24 NOTE — ED Triage Notes (Signed)
Per Dr. Edwina Barth, pt sent to the ED for Hbg 6.4, recently had GO workup that did not show any bleeding and had a blood transfusion.

## 2018-07-24 NOTE — ED Notes (Signed)
Sent a rainbow with T&S to lab.

## 2018-07-24 NOTE — ED Provider Notes (Signed)
[facility] Emergency Department Provider Note  ____________________________________________   I have reviewed the triage vital signs and the nursing notes.   HISTORY  Chief Complaint Abnormal Lab   History limited by: Not Limited   HPI Arthur [name] is a 85 y.o. male who presents to the emergency department today at the advice of his primary care doctor for low hemoglobin. The patient states he has chronic shortness of breath but denies any new or worse shortness of breath. States that he has not noticed any bleeding. Denies any weakness. Did have recent admission for GI bleed and anemia. He is not sure what value his hemoglobin showed at his doctors office.    Records reviewed. Per medical record review patient has a history of recent admission for anemia, melena. It appears that GI work up did not reveal site of bleed.   Past Medical History:  Diagnosis Date  . COPD (chronic obstructive pulmonary disease) ([place])   . Diabetes mellitus without complication ([place])   . Hyperlipidemia   . Hypertension     Patient Active Problem List   Diagnosis Date Noted  . Acute on chronic respiratory failure with hypoxia ([place]) 07/06/2018  . Severe anemia 06/27/2018  . GI bleeding 06/09/2018  . Sepsis ([place]) 05/23/2018  . Cellulitis of right lower extremity 05/22/2018  . DIABETES MELLITUS 10/24/2006  . OBESITY 10/24/2006  . HYPERTENSION 10/24/2006  . ALLERGIC RHINITIS 10/24/2006  . CHRONIC OBSTRUCTIVE PULMONARY DISEASE, MODERATE 10/24/2006  . TOBACCO ABUSE, HX OF 10/24/2006    Past Surgical History:  Procedure Laterality Date  . COLONOSCOPY WITH PROPOFOL N/A 06/11/2018   Procedure: COLONOSCOPY WITH PROPOFOL;  Surgeon: Jonathon Bellows, MD;  Location: [facility] ENDOSCOPY;  Service: Gastroenterology;  Laterality: N/A;  . COLONOSCOPY WITH PROPOFOL N/A 06/13/2018   Procedure: COLONOSCOPY WITH PROPOFOL;  Surgeon: Jonathon Bellows, MD;  Location: [facility] ENDOSCOPY;  Service:  Gastroenterology;  Laterality: N/A;  . ESOPHAGOGASTRODUODENOSCOPY Left 06/10/2018   Procedure: ESOPHAGOGASTRODUODENOSCOPY (EGD);  Surgeon: Jonathon Bellows, MD;  Location: [facility] ENDOSCOPY;  Service: Gastroenterology;  Laterality: Left;  . GIVENS CAPSULE STUDY N/A 06/28/2018   Procedure: GIVENS CAPSULE STUDY;  Surgeon: Lucilla Lame, MD;  Location: [facility] ENDOSCOPY;  Service: Endoscopy;  Laterality: N/A;  . GIVENS CAPSULE STUDY N/A 06/30/2018   Procedure: GIVENS CAPSULE STUDY;  Surgeon: Jonathon Bellows, MD;  Location: [facility] ENDOSCOPY;  Service: Gastroenterology;  Laterality: N/A;    Prior to Admission medications   Medication Sig Start Date End Date Taking? Authorizing Provider  albuterol (VENTOLIN HFA) 108 (90 Base) MCG/ACT inhaler Inhale 2 puffs into the lungs every 6 (six) hours as needed for wheezing or shortness of breath. 05/27/18   Demetrios Loll, MD  amLODipine (NORVASC) 2.5 MG tablet Take 2.5 mg by mouth daily. 04/26/18   [provider]  cefdinir (OMNICEF) 300 MG capsule Take 1 capsule (300 mg total) by mouth every 12 (twelve) hours. Patient not taking: Reported on 07/15/2018 07/10/18   Fritzi Mandes, MD  furosemide (LASIX) 20 MG tablet Take 1 tablet (20 mg total) by mouth daily. 07/05/18 08/04/18  Saundra Shelling, MD  insulin aspart (NOVOLOG) 100 UNIT/ML injection Inject 20 Units into the skin 3 (three) times daily before meals.    [provider]  ipratropium-albuterol (DUONEB) 0.5-2.5 (3) MG/3ML SOLN Take 3 mLs by nebulization every 6 (six) hours as needed. 05/28/18   Bettey Costa, MD  iron polysaccharides (NIFEREX) 150 MG capsule Take 1 capsule (150 mg total) by mouth daily. 07/11/18   Fritzi Mandes, MD  LEVEMIR 100 UNIT/ML injection Inject 0.12 mLs (12 Units total) into the skin daily. 07/10/18   Fritzi Mandes, MD  nystatin cream (MYCOSTATIN) Apply topically 2 (two) times daily. 05/28/18   Bettey Costa, MD  pantoprazole (PROTONIX) 40 MG tablet Take 1 tablet (40 mg total) by mouth 2 (two) times daily. 06/14/18    Lang Snow, NP  pravastatin (PRAVACHOL) 10 MG tablet Take 10 mg by mouth at bedtime. 04/25/18   [provider]  tamsulosin (FLOMAX) 0.4 MG CAPS capsule Take 0.4 mg by mouth daily. 04/25/18   [provider]    Allergies Codeine  Family History  Problem Relation Age of Onset  . COPD Mother   . Cancer Father     Social History Social History   Tobacco Use  . Smoking status: Former Smoker    Packs/day: 2.00    Years: 35.00    Pack years: 70.00    Types: Cigarettes    Quit date: 11/04/1982    Years since quitting: 35.7  . Smokeless tobacco: Current User  Substance Use Topics  . Alcohol use: Not Currently  . Drug use: No    Review of Systems Constitutional: No fever/chills Eyes: No visual changes. ENT: No sore throat. Cardiovascular: Denies chest pain. Respiratory: Positive for chronic shortness of breath. Gastrointestinal: No abdominal pain.  No nausea, no vomiting.  No diarrhea.   Genitourinary: Negative for dysuria. Musculoskeletal: Negative for back pain. Skin: Negative for rash. Neurological: Negative for headaches, focal weakness or numbness.  ____________________________________________   PHYSICAL EXAM:  VITAL SIGNS: ED Triage Vitals  Enc Vitals Group     BP 07/24/18 1818 (!) 148/51     Pulse Rate 07/24/18 1818 74     Resp 07/24/18 1818 18     Temp 07/24/18 1818 98.6 F (37 C)     Temp Source 07/24/18 1818 Oral     SpO2 07/24/18 1818 100 %     Weight 07/24/18 1819 246 lb (111.6 kg)     Height 07/24/18 1819 5\' 8"  (1.727 m)     Head Circumference --      Peak Flow --      Pain Score 07/24/18 1819 0    Constitutional: Alert and oriented.  Eyes: Conjunctivae are normal.  ENT      Head: Normocephalic and atraumatic.      Nose: No congestion/rhinnorhea.      Mouth/Throat: Mucous membranes are moist.      Neck: No stridor. Hematological/Lymphatic/Immunilogical: No cervical lymphadenopathy. Cardiovascular: Normal rate,  regular rhythm.  No murmurs, rubs, or gallops.  Respiratory: Normal respiratory effort without tachypnea nor retractions. Breath sounds are clear and equal bilaterally. No wheezes/rales/rhonchi. Gastrointestinal: Soft and non tender. No rebound. No guarding.  Rectal: GUIAC positive Musculoskeletal: Normal range of motion in all extremities. No lower extremity edema. Neurologic:  Normal speech and language. No gross focal neurologic deficits are appreciated.  Skin:  Skin is warm, dry and intact. No rash noted. Psychiatric: Mood and affect are normal. Speech and behavior are normal. Patient exhibits appropriate insight and judgment.  ____________________________________________    LABS (pertinent positives/negatives)  BMP na 138, k 3.6, cr 1.96 CBC wbc 6.0, hgb 7.3, plt 240 ____________________________________________   EKG  None  ____________________________________________    RADIOLOGY  None  ____________________________________________   PROCEDURES  Procedures  ____________________________________________   INITIAL IMPRESSION / ASSESSMENT AND PLAN / ED COURSE  Pertinent labs & imaging results that were available during my care of the patient were  reviewed by me and considered in my medical decision making (see chart for details).   Patient presented to the emergency department today because of concerns for anemia found on outpatient exams.  Patient's blood work here slightly better at 7.3.  Patient was guaiac positive.  Did have recent admission for melena and anemia.  Will plan on readmission.  Discussed findings and plan with patient.  ____________________________________________   FINAL CLINICAL IMPRESSION(S) / ED DIAGNOSES  Final diagnoses:  Anemia, unspecified type  Gastrointestinal hemorrhage, unspecified gastrointestinal hemorrhage type     Note: This dictation was prepared with Dragon dictation. Any transcriptional errors that result from this process  are unintentional     Nance Pear, MD 07/25/18 1753

## 2018-07-25 ENCOUNTER — Other Ambulatory Visit: Payer: Self-pay

## 2018-07-25 DIAGNOSIS — D649 Anemia, unspecified: Secondary | ICD-10-CM

## 2018-07-25 LAB — BASIC METABOLIC PANEL
Anion gap: 7 (ref 5–15)
BUN: 26 mg/dL — ABNORMAL HIGH (ref 8–23)
CO2: 35 mmol/L — ABNORMAL HIGH (ref 22–32)
Calcium: 8.1 mg/dL — ABNORMAL LOW (ref 8.9–10.3)
Chloride: 100 mmol/L (ref 98–111)
Creatinine, Ser: 2.14 mg/dL — ABNORMAL HIGH (ref 0.61–1.24)
GFR calc Af Amer: 32 mL/min — ABNORMAL LOW (ref >60)
GFR calc non Af Amer: 28 mL/min — ABNORMAL LOW (ref >60)
Glucose, Bld: 101 mg/dL — ABNORMAL HIGH (ref 70–99)
Potassium: 3.8 mmol/L (ref 3.5–5.1)
Sodium: 142 mmol/L (ref 135–145)

## 2018-07-25 LAB — CBC
HCT: 22.3 % — ABNORMAL LOW (ref 39.0–52.0)
Hemoglobin: 6.6 g/dL — ABNORMAL LOW (ref 13.0–17.0)
MCH: 28.6 pg (ref 26.0–34.0)
MCHC: 29.6 g/dL — ABNORMAL LOW (ref 30.0–36.0)
MCV: 96.5 fL (ref 80.0–100.0)
Platelets: 217 10*3/uL (ref 150–400)
RBC: 2.31 MIL/uL — ABNORMAL LOW (ref 4.22–5.81)
RDW: 18.5 % — ABNORMAL HIGH (ref 11.5–15.5)
WBC: 5.8 10*3/uL (ref 4.0–10.5)
nRBC: 0 % (ref 0.0–0.2)

## 2018-07-25 LAB — GLUCOSE, CAPILLARY
Glucose-Capillary: 87 mg/dL (ref 70–99)
Glucose-Capillary: 117 mg/dL — ABNORMAL HIGH (ref 70–99)
Glucose-Capillary: 116 mg/dL — ABNORMAL HIGH (ref 70–99)
Glucose-Capillary: 154 mg/dL — ABNORMAL HIGH (ref 70–99)
Glucose-Capillary: 139 mg/dL — ABNORMAL HIGH (ref 70–99)

## 2018-07-25 LAB — PREPARE RBC (CROSSMATCH)

## 2018-07-25 LAB — HEMOGLOBIN AND HEMATOCRIT, BLOOD
HCT: 26.3 % — ABNORMAL LOW (ref 39.0–52.0)
Hemoglobin: 8.1 g/dL — ABNORMAL LOW (ref 13.0–17.0)

## 2018-07-25 MED ORDER — AMLODIPINE BESYLATE 5 MG PO TABS
2.5000 mg | ORAL_TABLET | Freq: Every day | ORAL | Status: DC
  Administered 2018-07-25 – 2018-07-26 (×2): 2.5 mg via ORAL
  Filled 2018-07-25 (×2): qty 1

## 2018-07-25 MED ORDER — INSULIN ASPART 100 UNIT/ML ~~LOC~~ SOLN
0.0000 [IU] | Freq: Three times a day (TID) | SUBCUTANEOUS | Status: DC
  Administered 2018-07-25: 2 [IU] via SUBCUTANEOUS
  Administered 2018-07-26 (×2): 1 [IU] via SUBCUTANEOUS
  Filled 2018-07-25 (×3): qty 1

## 2018-07-25 MED ORDER — ACETAMINOPHEN 650 MG RE SUPP: 650.0000 mg | Freq: Four times a day (QID) | RECTAL | Status: DC | PRN

## 2018-07-25 MED ORDER — ORAL CARE MOUTH RINSE
15.0000 mL | Freq: Two times a day (BID) | OROMUCOSAL | Status: DC
  Administered 2018-07-25: 15 mL via OROMUCOSAL

## 2018-07-25 MED ORDER — INSULIN ASPART 100 UNIT/ML ~~LOC~~ SOLN: 0.0000 [IU] | Freq: Every day | SUBCUTANEOUS | Status: DC

## 2018-07-25 MED ORDER — PRAVASTATIN SODIUM 20 MG PO TABS
10.0000 mg | ORAL_TABLET | Freq: Every day | ORAL | Status: DC
  Administered 2018-07-25: 10 mg via ORAL
  Filled 2018-07-25 (×2): qty 1

## 2018-07-25 MED ORDER — SODIUM CHLORIDE 0.9% IV SOLUTION
Freq: Once | INTRAVENOUS | Status: AC
  Administered 2018-07-25: 09:00:00 via INTRAVENOUS

## 2018-07-25 MED ORDER — FUROSEMIDE 20 MG PO TABS
20.0000 mg | ORAL_TABLET | Freq: Every day | ORAL | Status: DC
  Administered 2018-07-25 – 2018-07-26 (×2): 20 mg via ORAL
  Filled 2018-07-25 (×2): qty 1

## 2018-07-25 MED ORDER — ACETAMINOPHEN 325 MG PO TABS: 650.0000 mg | ORAL_TABLET | Freq: Four times a day (QID) | ORAL | Status: DC | PRN

## 2018-07-25 MED ORDER — SODIUM CHLORIDE 0.9 % IV SOLN
INTRAVENOUS | Status: DC
  Administered 2018-07-25: 02:00:00 via INTRAVENOUS

## 2018-07-25 MED ORDER — ONDANSETRON HCL 4 MG/2ML IJ SOLN: 4.0000 mg | Freq: Four times a day (QID) | INTRAMUSCULAR | Status: DC | PRN

## 2018-07-25 MED ORDER — INSULIN ASPART 100 UNIT/ML ~~LOC~~ SOLN: 0.0000 [IU] | Freq: Four times a day (QID) | SUBCUTANEOUS | Status: DC

## 2018-07-25 MED ORDER — TAMSULOSIN HCL 0.4 MG PO CAPS
0.4000 mg | ORAL_CAPSULE | Freq: Every day | ORAL | Status: DC
  Administered 2018-07-25 – 2018-07-26 (×2): 0.4 mg via ORAL
  Filled 2018-07-25 (×2): qty 1

## 2018-07-25 MED ORDER — ONDANSETRON HCL 4 MG PO TABS: 4.0000 mg | ORAL_TABLET | Freq: Four times a day (QID) | ORAL | Status: DC | PRN

## 2018-07-25 NOTE — Progress Notes (Signed)
Ch visited with pt to complete HPOA. Pt was resting. [place] f/u with pt's daughter who stated that she is the assigned HPOA.  No further needs at this time.    07/25/18 1100  Clinical Encounter Type  Visited With Patient;Family  Visit Type Other (Comment) (AD completion )  Referral From Physician  Consult/Referral To Chaplain  Recommendations have family provided documnnetation of HPOA   Stress Factors  Patient Stress Factors Health changes;Major life changes  Family Stress Factors None identified

## 2018-07-25 NOTE — Progress Notes (Addendum)
Updated patient's daughter on plan of care. She had some concerns about plan of care because patient has already had multiple capsule studies. Dr. Berniece Andreas about concerns, and asked him to call daughter. He stated that he will call daughter. Arthur [name]

## 2018-07-25 NOTE — Evaluation (Signed)
Physical Therapy Evaluation Patient Details Name: Arthur [name] MRN: [id number] DOB: 23-Nov-1933 Today's Date: 07/25/2018   History of Present Illness  Patient is an 85 year old male re-admitted for GI bleed. Frequent re-admissions within the past few weeks for this per previous notes. PMH includes COPD, HLD, DM, HTN, BPH, and CKD III. Patient wears 2L 02 chronically.  Clinical Impression  Patient is an 85 year old male who presents with generalized weakness and instability. Patient's evaluation limited during session due to patient declining further standing tasks/ambulation stating he was too tired and his L hip hurt. Patient intermittently agitated throughout session. He requires assistance with taking his LE's onto/off of bed during transfers and had an episode of posterior LOB with sit to stand transfers and static standing. Patient's SP02 decreased to 81% on 2 L of 02 via nasal cannula with static standing marches with RW. Patient will benefit from skilled physical to increase strength, mobility, and decrease falls risk.  Patient demonstrated limited mobility during evaluation. Will continue to monitor and update POC over trial period of 3-4 sessions.     Follow Up Recommendations SNF(Patient demonstrated limited mobility during evaluation. Will continue to monitor and update POC over trial period of 3-4 sessions.)    Equipment Recommendations  None recommended by PT    Recommendations for Other Services       Precautions / Restrictions Precautions Precautions: Fall Restrictions Weight Bearing Restrictions: No      Mobility  Bed Mobility Overal bed mobility: Needs Assistance Bed Mobility: Supine to Sit;Sit to Supine     Supine to sit: Min assist;HOB elevated Sit to supine: Mod assist;HOB elevated   General bed mobility comments: requires assistance for LE onto/off of bed  Transfers Overall transfer level: Needs assistance Equipment used: Rolling walker (2  wheeled) Transfers: Sit to/from Stand Sit to Stand: Min guard;Min assist         General transfer comment: Initially unable to stand first attempt. Stands second attempt with CGA requiring Min A for maintaining balance due to posterior LOB  Ambulation/Gait             General Gait Details: unable to attempt due to pain in L hip and patient refusal.  Stairs            Wheelchair Mobility    Modified Rankin (Stroke Patients Only)       Balance Overall balance assessment: Needs assistance Sitting-balance support: Feet supported;Bilateral upper extremity supported Sitting balance-Leahy Scale: Poor Sitting balance - Comments: requires UE assistance to move LE's without posterior LOB Postural control: Posterior lean Standing balance support: Bilateral upper extremity supported Standing balance-Leahy Scale: Poor Standing balance comment: posterior LOB, required Min A to retain COM after posterior LOB with static marching                             Pertinent Vitals/Pain Pain Assessment: 0-10 Pain Score: 8  Pain Location: L hip when standing/marching Pain Descriptors / Indicators: Aching Pain Intervention(s): Monitored during session;Repositioned    Home Living Family/patient expects to be discharged to:: Private residence Living Arrangements: Alone Available Help at Discharge: Family Type of Home: House Home Access: Stairs to enter Entrance Stairs-Rails: Right;Left;Can reach both Technical brewer of Steps: 8 Home Layout: One level Home Equipment: Cane - single point;Walker - 2 wheels;Walker - 4 wheels;Bedside commode      Prior Function Level of Independence: Needs assistance   Gait / Transfers Assistance  Needed: Patient ambulates with RW short distances. Transfers to [facility] for toileting.  ADL's / Homemaking Assistance Needed: Patient reports he has home nursing and PT. Daughter makes meals for him.        Hand Dominance         Extremity/Trunk Assessment   Upper Extremity Assessment Upper Extremity Assessment: Generalized weakness    Lower Extremity Assessment Lower Extremity Assessment: Generalized weakness(grossly 4-/5 strength,)    Cervical / Trunk Assessment Cervical / Trunk Assessment: Normal  Communication   Communication: No difficulties;HOH  Cognition Arousal/Alertness: Awake/alert Behavior During Therapy: Agitated Overall Cognitive Status: No family/caregiver present to determine baseline cognitive functioning                                 General Comments: Patient occasionally confused, irritated with masks/shields, intermittently inappropriate to male physical therapist. Intermittantly agitated limiting evaluation.      General Comments General comments (skin integrity, edema, etc.): Patient has swelling and discoloration of bilateral LE's.    Exercises Other Exercises Other Exercises: patient educated on safe transfers and mobility for decreased fall risk. static marching in standing for upright posture with RW and to decrease posterior LOB   Assessment/Plan    PT Assessment Patient needs continued PT services  PT Problem List Decreased activity tolerance;Decreased balance;Decreased strength;Decreased range of motion;Decreased mobility;Decreased safety awareness;Cardiopulmonary status limiting activity;Decreased knowledge of use of DME;Pain       PT Treatment Interventions Gait training;Stair training;Functional mobility training;Therapeutic activities;Therapeutic exercise;Balance training;Cognitive remediation;Patient/family education;DME instruction;Neuromuscular re-education;Manual techniques    PT Goals (Current goals can be found in the Care Plan section)  Acute Rehab PT Goals Patient Stated Goal: to be stronger PT Goal Formulation: With patient Time For Goal Achievement: 08/08/18 Potential to Achieve Goals: Fair    Frequency Min 2X/week   Barriers to  discharge Decreased caregiver support;Inaccessible home environment Patient demonstrated limited mobility during evaluation. Will continue to monitor and update POC over trial period of 3-4 sessions.    Co-evaluation               AM-PAC PT "6 Clicks" Mobility  Outcome Measure Help needed turning from your back to your side while in a flat bed without using bedrails?: A Little Help needed moving from lying on your back to sitting on the side of a flat bed without using bedrails?: A Lot Help needed moving to and from a bed to a chair (including a wheelchair)?: A Little Help needed standing up from a chair using your arms (e.g., wheelchair or bedside chair)?: A Little Help needed to walk in hospital room?: A Little Help needed climbing 3-5 steps with a railing? : A Lot 6 Click Score: 16    End of Session Equipment Utilized During Treatment: Gait belt;Oxygen Activity Tolerance: Patient limited by fatigue;Other (comment);Patient limited by pain(pain of L hip) Patient left: in bed;with call bell/phone within reach;with bed alarm set;with SCD's reapplied Nurse Communication: Mobility status(desat to 81 with standing marches.) PT Visit Diagnosis: Difficulty in walking, not elsewhere classified (R26.2);Muscle weakness (generalized) (M62.81);Unsteadiness on feet (R26.81);Other abnormalities of gait and mobility (R26.89);Pain Pain - Right/Left: Left Pain - part of body: Hip    Time: 4034-7425 PT Time Calculation (min) (ACUTE ONLY): 23 min   Charges:   PT Evaluation $PT Eval Low Complexity: 1 Low PT Treatments $Therapeutic Activity: 8-22 mins        Janna Arch, PT, DPT    [place]  Darius Fillingim 07/25/2018, 2:41 PM

## 2018-07-25 NOTE — Progress Notes (Signed)
[place] [place] NAME: Arthur [name]    MR#:  [id number]  DATE OF BIRTH:  [birth year]  SUBJECTIVE:    Patient sent here from PCPs office due to low hemoglobin.  Patient was recently admitted for severe anemia. Patient does report dark-colored stools. REVIEW OF SYSTEMS:    Review of Systems  Constitutional: Negative for fever, chills weight loss HENT: Negative for ear pain, nosebleeds, congestion, facial swelling, rhinorrhea, neck pain, neck stiffness and ear discharge.   Respiratory: Negative for cough, shortness of breath, wheezing  Cardiovascular: Negative for chest pain, palpitations and leg swelling.  Gastrointestinal: Negative for heartburn, abdominal pain, vomiting, diarrhea or consitpation Positive for dark-colored stools Genitourinary: Negative for dysuria, urgency, frequency, hematuria Musculoskeletal: Negative for back pain or joint pain Neurological: Negative for dizziness, seizures, syncope, focal weakness,  numbness and headaches.  Hematological: Does not bruise/bleed easily.  Psychiatric/Behavioral: Negative for hallucinations, confusion, dysphoric mood    Tolerating Diet: yes      DRUG ALLERGIES:   Allergies  Allergen Reactions  . Codeine Other (See Comments)    Constipation    VITALS:  Blood pressure (!) 156/57, pulse 73, temperature 98 F (36.7 C), temperature source Oral, resp. rate 18, height 5\' 8"  (1.727 m), weight 114.4 kg, SpO2 100 %.  PHYSICAL EXAMINATION:  Constitutional: Appears well-developed and well-nourished. No distress. HENT: Normocephalic. Marland Kitchen Oropharynx is clear and moist.  Eyes: Conjunctivae and EOM are normal. PERRLA, no scleral icterus.  Neck: Normal ROM. Neck supple. No JVD. No tracheal deviation. CVS: RRR, S1/S2 +, no murmurs, no gallops, no carotid bruit.  Pulmonary: Effort and breath sounds normal, no stridor, rhonchi, wheezes, rales.  Abdominal: Soft. BS +,  no distension, tenderness,  rebound or guarding.  Musculoskeletal: Normal range of motion. No edema and no tenderness.  Neuro: Alert. CN 2-12 grossly intact. No focal deficits. Skin: Skin is warm and dry. No rash noted. Psychiatric: Normal mood and affect.      LABORATORY PANEL:   CBC Recent Labs  Lab 07/25/18 0327  WBC 5.8  HGB 6.6*  HCT 22.3*  PLT 217   ------------------------------------------------------------------------------------------------------------------  Chemistries  Recent Labs  Lab 07/25/18 0327  NA 142  K 3.8  CL 100  CO2 35*  GLUCOSE 101*  BUN 26*  CREATININE 2.14*  CALCIUM 8.1*   ------------------------------------------------------------------------------------------------------------------  Cardiac Enzymes No results for input(s): TROPONINI in the last 168 hours. ------------------------------------------------------------------------------------------------------------------  RADIOLOGY:  No results found.   ASSESSMENT AND PLAN:   85 year old male with history of chronic anemia and recent admission for severe anemia with work-up including EGD and colonoscopy which did not show any lesions to explain blood loss anemia who presented from PCP office due to low hemoglobin.  1.  Acute on chronic anemia: Hemoglobin is dropped to 6.6. Patient has consented for blood transfusion. Anemia panel consistent with chronic disease GI has evaluated patient and is recommending if patient has active bleeding then obtain tagged red blood cell.  Otherwise capsule study if blood count continues to drop.  2.  Chronic kidney disease stage III: Creatinine is near baseline.  3.  Diabetes: Continue sliding scale  4.  BPH: Continue Flomax  5.  Hyperlipidemia: Continue statin  6.  Essential hypertension: Continue Norvasc   7.  COPD without signs of exacerbation     Management plans discussed with the patient and he is in agreement.  CODE STATUS: dnr  TOTAL TIME TAKING CARE OF  THIS PATIENT: 30 minutes.  POSSIBLE D/C 1-2 days, DEPENDING ON CLINICAL CONDITION.   Bettey Costa M.D on 07/25/2018 at 11:35 AM  Between 7am to 6pm - Pager - [phone] After 6pm go to [email or web address] - password [email or web address] [facility]  Office  [phone]  CC: Primary care physician; Baxter Hire, MD  Note: This dictation was prepared with Dragon dictation along with smaller phrase technology. Any transcriptional errors that result from this process are unintentional.

## 2018-07-25 NOTE — Consult Note (Signed)
Jonathon Bellows , MD [address] [place], [place], [place], [address] [address], [place], [place], [place], [address] Phone: [phone]  Fax: [phone]  Consultation  Referring Provider:     Dr Benjie Karvonen  Primary Care Physician:  Baxter Hire, MD Primary Gastroenterologist:  Dr. Vicente Males         Reason for Consultation:     GI bleed  Date of Admission:  07/24/2018 Date of Consultation:  07/25/2018         HPI:   Arthur [name] is a 85 y.o. male was recently admitted and discharged for severe anemia.  GI evaluation as mentioned below did not show a clear source of bleeding.  He was sent back to the ER by his primary care doctor for hemoglobin of 7.3 g when checked 10 days back his hemoglobin was similarly at 7.3 g and 7.5 g.   Summary of history : Admitted on 06/09/2018 with melena. Hb 3.6 grams with MCV 100. Prior to that Hb was 8 grams 3 weeks prior. Urine showed no blood. No iron studies or b12 levels checked. Underwent a blood transfusion.   EGD+colonoscopy was performed that showed no lesions that would explain the severe anemia.  Possible short segment barrettes esophagus.  06/30/2018: Capsule study of small bowel showed no clear source of bleeding.  Interval history7/13/2020-07/25/2018   He states he was sent to the hospital by his doctor at [facility] clinic because his Hb was low, He denies any nose bleeds, hematemesis or melena.     Past Medical History:  Diagnosis Date  . CKD (chronic kidney disease), stage III ([place])   . COPD (chronic obstructive pulmonary disease) ([place])   . Diabetes mellitus without complication ([place])   . Hyperlipidemia   . Hypertension     Past Surgical History:  Procedure Laterality Date  . COLONOSCOPY WITH PROPOFOL N/A 06/11/2018   Procedure: COLONOSCOPY WITH PROPOFOL;  Surgeon: Jonathon Bellows, MD;  Location: [facility] ENDOSCOPY;  Service: Gastroenterology;  Laterality: N/A;  . COLONOSCOPY WITH PROPOFOL N/A 06/13/2018   Procedure: COLONOSCOPY WITH  PROPOFOL;  Surgeon: Jonathon Bellows, MD;  Location: [facility] ENDOSCOPY;  Service: Gastroenterology;  Laterality: N/A;  . ESOPHAGOGASTRODUODENOSCOPY Left 06/10/2018   Procedure: ESOPHAGOGASTRODUODENOSCOPY (EGD);  Surgeon: Jonathon Bellows, MD;  Location: [facility] ENDOSCOPY;  Service: Gastroenterology;  Laterality: Left;  . GIVENS CAPSULE STUDY N/A 06/28/2018   Procedure: GIVENS CAPSULE STUDY;  Surgeon: Lucilla Lame, MD;  Location: [facility] ENDOSCOPY;  Service: Endoscopy;  Laterality: N/A;  . GIVENS CAPSULE STUDY N/A 06/30/2018   Procedure: GIVENS CAPSULE STUDY;  Surgeon: Jonathon Bellows, MD;  Location: [facility] ENDOSCOPY;  Service: Gastroenterology;  Laterality: N/A;    Prior to Admission medications   Medication Sig Start Date End Date Taking? Authorizing Provider  amLODipine (NORVASC) 2.5 MG tablet Take 2.5 mg by mouth daily. 04/26/18  Yes [provider]  furosemide (LASIX) 20 MG tablet Take 1 tablet (20 mg total) by mouth daily. 07/05/18 08/04/18 Yes Pyreddy, Reatha Harps, MD  ipratropium-albuterol (DUONEB) 0.5-2.5 (3) MG/3ML SOLN Take 3 mLs by nebulization every 6 (six) hours as needed. 05/28/18  Yes Bettey Costa, MD  iron polysaccharides (NIFEREX) 150 MG capsule Take 1 capsule (150 mg total) by mouth daily. 07/11/18  Yes Fritzi Mandes, MD  LEVEMIR 100 UNIT/ML injection Inject 0.12 mLs (12 Units total) into the skin daily. 07/10/18  Yes Fritzi Mandes, MD  pantoprazole (PROTONIX) 40 MG tablet Take 1 tablet (40 mg total) by mouth 2 (two) times daily. 06/14/18  Yes  Lang Snow, NP  pravastatin (PRAVACHOL) 10 MG tablet Take 10 mg by mouth at bedtime. 04/25/18  Yes [provider]  tamsulosin (FLOMAX) 0.4 MG CAPS capsule Take 0.4 mg by mouth daily. 04/25/18  Yes [provider]  torsemide (DEMADEX) 20 MG tablet Take 20 mg by mouth 2 (two) times a day. 07/23/18  Yes [provider]  albuterol (VENTOLIN HFA) 108 (90 Base) MCG/ACT inhaler Inhale 2 puffs into the lungs every 6 (six) hours as needed for wheezing  or shortness of breath. 05/27/18   Demetrios Loll, MD  cefdinir (OMNICEF) 300 MG capsule Take 1 capsule (300 mg total) by mouth every 12 (twelve) hours. Patient not taking: Reported on 07/15/2018 07/10/18   Fritzi Mandes, MD  insulin aspart (NOVOLOG) 100 UNIT/ML injection Inject 20 Units into the skin 3 (three) times daily before meals.    [provider]  nystatin cream (MYCOSTATIN) Apply topically 2 (two) times daily. 05/28/18   Bettey Costa, MD    Family History  Problem Relation Age of Onset  . COPD Mother   . Cancer Father      Social History   Tobacco Use  . Smoking status: Former Smoker    Packs/day: 2.00    Years: 35.00    Pack years: 70.00    Types: Cigarettes    Quit date: 11/04/1982    Years since quitting: 35.7  . Smokeless tobacco: Current User  Substance Use Topics  . Alcohol use: Not Currently  . Drug use: No    Allergies as of 07/24/2018 - Review Complete 07/24/2018  Allergen Reaction Noted  . Codeine Other (See Comments) 08/06/2013    Review of Systems:    All systems reviewed and negative except where noted in HPI.   Physical Exam:  Vital signs in last 24 hours: Temp:  [97.7 F (36.5 C)-98.6 F (37 C)] 97.7 F (36.5 C) (07/23 0811) Pulse Rate:  [68-85] 76 (07/23 0811) Resp:  [17-20] 20 (07/23 0811) BP: (131-163)/(51-86) 144/57 (07/23 0811) SpO2:  [95 %-100 %] 100 % (07/23 0811) Weight:  [111.6 kg-114.4 kg] 114.4 kg (07/23 0205)   General:   Pleasant, cooperative in NAD Head:  Normocephalic and atraumatic. Eyes:   No icterus.   Conjunctiva pink. PERRLA. Ears:  Normal auditory acuity. Neck:  Supple; no masses or thyroidomegaly Lungs: Respirations even and unlabored. Lungs clear to auscultation bilaterally.   No wheezes, crackles, or rhonchi.  Heart:  Regular rate and rhythm;  Without murmur, clicks, rubs or gallops Abdomen:  Soft, nondistended, nontender. Normal bowel sounds. No appreciable masses or hepatomegaly.  No rebound or guarding.   Neurologic:  Alert and oriented x3;  grossly normal neurologically. Skin:  Intact without significant lesions or rashes. Cervical Nodes:  No significant cervical adenopathy. Psych:  Alert and cooperative. Normal affect.  LAB RESULTS: Recent Labs    07/24/18 1820 07/25/18 0327  WBC 6.0 5.8  HGB 7.3* 6.6*  HCT 24.0* 22.3*  PLT 240 217   BMET Recent Labs    07/24/18 1820 07/25/18 0327  NA 138 142  K 3.6 3.8  CL 96* 100  CO2 35* 35*  GLUCOSE 89 101*  BUN 26* 26*  CREATININE 1.96* 2.14*  CALCIUM 8.4* 8.1*   LFT No results for input(s): PROT, ALBUMIN, AST, ALT, ALKPHOS, BILITOT, BILIDIR, IBILI in the last 72 hours. PT/INR No results for input(s): LABPROT, INR in the last 72 hours.  STUDIES: No results found.    Impression / Plan:  WALDEN STATZ is a 85 y.o. y/o male  recentlyadmitted with a HB 3.6, melena , EGD+colonoscopy showed no lesion to explain the blood loss. Was transfused , stable and discharged.   At the time of discharge his hemoglobin was around 7.3 g.  He was sent back to the ER yesterday by the primary care doctor with a hemoglobin of 7.3 g.  There was no change from his prior CBC.  This morning has dropped slightly 6.6-  .  He is receiving 1 unit of PRBC.  Plan  1.  If has active bleeding in terms of melena or hematemesis get a tagged RBC scan.  Otherwise I would suggest we perform a capsule study if his blood counts do not increment to around 8.3 grams tomorrow  .    Thank you for involving me in the care of this patient.      LOS: 1 day   Jonathon Bellows, MD  07/25/2018, 8:53 AM

## 2018-07-25 NOTE — ED Notes (Signed)
Not able to give report to floor because room is not ready yet

## 2018-07-25 NOTE — Progress Notes (Signed)
Family Meeting Note  Advance Directive:no  Today a meeting took place with the Patient.   The following clinical team members were present during this meeting:MD  The following were discussed:Patient's diagnosis:anemai , Patient's progosis: Unable to determine and Goals for treatment: DNR  Additional follow-up to be provided: dnr changed in computer and chaplain consultation to create advanced directive.  Time spent during discussion: 16 minutes  Bettey Costa, MD

## 2018-07-25 NOTE — TOC Initial Note (Signed)
Transition of Care [facility]) - Initial/Assessment Note    Patient Details  Name: Arthur [name] MRN: [id number] Date of Birth: 12/07/1933  Transition of Care [facility]) CM/SW Contact:    Su Hilt, RN Phone Number: 07/25/2018, 3:14 PM  Clinical Narrative:                 Spoke with the patient on the phone to discuss DC plan and needs He states that he lives alone and his daughter helps him, she also provides transportation for him He refuses to go to Rehab of any kind He is open with [facility] for RN, PT and aide and agrees to continue He has a RW, cane walking stick and BSC at home, no additional DME needs Spoke with Corene Cornea at [facility] to confirm set up with [facility] services Patient states he is up to date with his doctor and he does not need anything else  Expected Discharge Plan: [employer] Barriers to Discharge: Continued Medical Work up   Patient Goals and CMS Choice Patient states their goals for this hospitalization and ongoing recovery are:: go home CMS Medicare.gov Compare Post Acute Care list provided to:: Patient Choice offered to / list presented to : Patient  Expected Discharge Plan and Services Expected Discharge Plan: [place]   Discharge Planning Services: CM Consult   Living arrangements for the past 2 months: Single Family Home                 DME Arranged: N/A         HH Arranged: PT, RN, Nurse's Aide HH Agency: [place] ([place]) Date HH Agency Contacted: 07/25/18 Time HH Agency Contacted: 85 Representative spoke with at [place]: Corene Cornea  Prior Living Arrangements/Services Living arrangements for the past 2 months: [place] with:: Self Patient language and need for interpreter reviewed:: No Do you feel safe going back to the place where you live?: Yes      Need for Family Participation in Patient Care: No (Comment) Care giver support system in place?: Yes (comment) Current home services:  DME(RW, cane, walking stick, raised toilet seat) Criminal Activity/Legal Involvement Pertinent to Current Situation/Hospitalization: No - Comment as needed  Activities of Daily Living Home Assistive Devices/Equipment: Walker (specify type), Oxygen, CBG Meter, Shower chair with back, Nebulizer, Dentures (specify type), Bedside commode/3-in-1 ADL Screening (condition at time of admission) Patient's cognitive ability adequate to safely complete daily activities?: Yes Is the patient deaf or have difficulty hearing?: Yes Does the patient have difficulty seeing, even when wearing glasses/contacts?: No Does the patient have difficulty concentrating, remembering, or making decisions?: No Patient able to express need for assistance with ADLs?: Yes Does the patient have difficulty dressing or bathing?: Yes Independently performs ADLs?: Yes (appropriate for developmental age) Does the patient have difficulty walking or climbing stairs?: Yes Weakness of Legs: Both Weakness of Arms/Hands: Both  Permission Sought/Granted   Permission granted to share information with : Yes, Verbal Permission Granted              Emotional Assessment Appearance:: Appears stated age Attitude/Demeanor/Rapport: Engaged Affect (typically observed): Appropriate Orientation: : Oriented to Self, Oriented to Place, Oriented to  Time, Oriented to Situation Alcohol / Substance Use: Not Applicable Psych Involvement: No (comment)  Admission diagnosis:  Gastrointestinal hemorrhage, unspecified gastrointestinal hemorrhage type [K92.2] Anemia, unspecified type [D64.9] Patient Active Problem List   Diagnosis Date Noted  . HLD (hyperlipidemia) 07/24/2018  .  Acute on chronic respiratory failure with hypoxia ([place]) 07/06/2018  . Severe anemia 06/27/2018  . GI bleeding 06/09/2018  . Sepsis ([place]) 05/23/2018  . Cellulitis of right lower extremity 05/22/2018  . Diabetes ([place]) 10/24/2006  . OBESITY 10/24/2006  . Essential  hypertension 10/24/2006  . ALLERGIC RHINITIS 10/24/2006  . COPD (chronic obstructive pulmonary disease) ([place]) 10/24/2006  . TOBACCO ABUSE, HX OF 10/24/2006   PCP:  Baxter Hire, MD Pharmacy:   [facility] [address] [place] [address] [place] [address] Phone: [phone] Fax: [phone]     Social Determinants of Health (SDOH) Interventions    Readmission Risk Interventions Readmission Risk Prevention Plan 07/10/2018 07/07/2018 07/02/2018  Transportation Screening - Complete Complete  PCP or Specialist Appt within 5-7 Days - - Complete  PCP or Specialist Appt within 3-5 Days Complete - -  Home Care Screening - - Complete  Medication Review (RN CM) - - Complete  HRI or [place] - Complete -  Social Work Consult for [place] Planning/Counseling Complete - -  Palliative Care Screening Not Applicable - -  Medication Review (RN Care Manager) - Complete -  Some recent data might be hidden

## 2018-07-26 LAB — CBC
HCT: 27.2 % — ABNORMAL LOW (ref 39.0–52.0)
Hemoglobin: 8.3 g/dL — ABNORMAL LOW (ref 13.0–17.0)
MCH: 29.1 pg (ref 26.0–34.0)
MCHC: 30.5 g/dL (ref 30.0–36.0)
MCV: 95.4 fL (ref 80.0–100.0)
Platelets: 222 10*3/uL (ref 150–400)
RBC: 2.85 MIL/uL — ABNORMAL LOW (ref 4.22–5.81)
RDW: 17.8 % — ABNORMAL HIGH (ref 11.5–15.5)
WBC: 6.1 10*3/uL (ref 4.0–10.5)
nRBC: 0 % (ref 0.0–0.2)

## 2018-07-26 LAB — NOVEL CORONAVIRUS, NAA (HOSP ORDER, SEND-OUT TO REF LAB; TAT 18-24 HRS): SARS-CoV-2, NAA: NOT DETECTED

## 2018-07-26 LAB — BASIC METABOLIC PANEL
Anion gap: 7 (ref 5–15)
BUN: 23 mg/dL (ref 8–23)
CO2: 36 mmol/L — ABNORMAL HIGH (ref 22–32)
Calcium: 8.5 mg/dL — ABNORMAL LOW (ref 8.9–10.3)
Chloride: 97 mmol/L — ABNORMAL LOW (ref 98–111)
Creatinine, Ser: 1.93 mg/dL — ABNORMAL HIGH (ref 0.61–1.24)
GFR calc Af Amer: 36 mL/min — ABNORMAL LOW (ref >60)
GFR calc non Af Amer: 31 mL/min — ABNORMAL LOW (ref >60)
Glucose, Bld: 136 mg/dL — ABNORMAL HIGH (ref 70–99)
Potassium: 4.2 mmol/L (ref 3.5–5.1)
Sodium: 140 mmol/L (ref 135–145)

## 2018-07-26 LAB — GLUCOSE, CAPILLARY
Glucose-Capillary: 141 mg/dL — ABNORMAL HIGH (ref 70–99)
Glucose-Capillary: 124 mg/dL — ABNORMAL HIGH (ref 70–99)

## 2018-07-26 NOTE — Discharge Summary (Signed)
[place] at [place] NAME: Arthur [name]    MR#:  [id number]  DATE OF BIRTH:  1933-03-17  DATE OF ADMISSION:  07/24/2018 ADMITTING PHYSICIAN: Lance Coon, MD  DATE OF DISCHARGE: 07/26/2018  PRIMARY CARE PHYSICIAN: Baxter Hire, MD    ADMISSION DIAGNOSIS:  Gastrointestinal hemorrhage, unspecified gastrointestinal hemorrhage type [K92.2] Anemia, unspecified type [D64.9]  DISCHARGE DIAGNOSIS:  Principal Problem:   GI bleeding Active Problems:   Diabetes ([place])   Essential hypertension   COPD (chronic obstructive pulmonary disease) ([facility])   HLD (hyperlipidemia)   SECONDARY DIAGNOSIS:   Past Medical History:  Diagnosis Date  . CKD (chronic kidney disease), stage III ([place])   . COPD (chronic obstructive pulmonary disease) ([place])   . Diabetes mellitus without complication ([place])   . Hyperlipidemia   . Hypertension     HOSPITAL COURSE:   85 year old male with history of chronic anemia and recent admission for severe anemia with work-up including EGD and colonoscopy which did not show any lesions to explain blood loss anemia who presented from PCP office due to low hemoglobin.  1.  Acute on chronic anemia: Hemoglobin  dropped to 6.6. Patient is status post 1 unit PRBC.  Hemoglobin remained stable.  Hemoglobin is actually better than his previous discharge.   Patient has been evaluated by GI.  No further work-up at this time.  Patient had extensive work-up in the past without a clear etiology.   anemia panel consistent with chronic disease   2.  Chronic kidney disease stage III: Creatinine is stable.  3.  Diabetes: He will continue ADA diet  4.  BPH: Continue Flomax  5.  Hyperlipidemia: Continue statin  6.  Essential hypertension: Continue Norvasc   7.  COPD without signs of exacerbation    DISCHARGE CONDITIONS AND DIET:   Stable for discharge diabetic diet  CONSULTS OBTAINED:  Treatment Team:  Jonathon Bellows,  MD  DRUG ALLERGIES:   Allergies  Allergen Reactions  . Codeine Other (See Comments)    Constipation    DISCHARGE MEDICATIONS:   Allergies as of 07/26/2018      Reactions   Codeine Other (See Comments)   Constipation      Medication List    STOP taking these medications   cefdinir 300 MG capsule Commonly known as: OMNICEF   torsemide 20 MG tablet Commonly known as: DEMADEX     TAKE these medications   albuterol 108 (90 Base) MCG/ACT inhaler Commonly known as: VENTOLIN HFA Inhale 2 puffs into the lungs every 6 (six) hours as needed for wheezing or shortness of breath.   amLODipine 2.5 MG tablet Commonly known as: NORVASC Take 2.5 mg by mouth daily.   furosemide 20 MG tablet Commonly known as: LASIX Take 1 tablet (20 mg total) by mouth daily.   insulin aspart 100 UNIT/ML injection Commonly known as: novoLOG Inject 20 Units into the skin 3 (three) times daily before meals.   ipratropium-albuterol 0.5-2.5 (3) MG/3ML Soln Commonly known as: DUONEB Take 3 mLs by nebulization every 6 (six) hours as needed.   iron polysaccharides 150 MG capsule Commonly known as: NIFEREX Take 1 capsule (150 mg total) by mouth daily.   Levemir 100 UNIT/ML injection Generic drug: insulin detemir Inject 0.12 mLs (12 Units total) into the skin daily.   nystatin cream Commonly known as: MYCOSTATIN Apply topically 2 (two) times daily.   pantoprazole 40 MG tablet Commonly known as: Protonix Take 1 tablet (40 mg total)  by mouth 2 (two) times daily.   pravastatin 10 MG tablet Commonly known as: PRAVACHOL Take 10 mg by mouth at bedtime.   tamsulosin 0.4 MG Caps capsule Commonly known as: FLOMAX Take 0.4 mg by mouth daily.         Today   CHIEF COMPLAINT:  Patient doing well and is anxious to go home today.  Denies melena or hematochezia.  Denies abdominal pain, nausea or vomiting.   VITAL SIGNS:  Blood pressure (!) 173/61, pulse 88, temperature 100.1 F (37.8 C),  temperature source Oral, resp. rate 18, height 5\' 8"  (1.727 m), weight 114.4 kg, SpO2 94 %.   REVIEW OF SYSTEMS:  Review of Systems  Constitutional: Negative.  Negative for chills, fever and malaise/fatigue.  HENT: Negative.  Negative for ear discharge, ear pain, hearing loss, nosebleeds and sore throat.   Eyes: Negative.  Negative for blurred vision and pain.  Respiratory: Negative.  Negative for cough, hemoptysis, shortness of breath and wheezing.   Cardiovascular: Negative.  Negative for chest pain, palpitations and leg swelling.  Gastrointestinal: Negative.  Negative for abdominal pain, blood in stool, diarrhea, nausea and vomiting.  Genitourinary: Negative.  Negative for dysuria.  Musculoskeletal: Negative.  Negative for back pain.  Skin: Negative.   Neurological: Negative for dizziness, tremors, speech change, focal weakness, seizures and headaches.  Endo/Heme/Allergies: Negative.  Does not bruise/bleed easily.  Psychiatric/Behavioral: Negative.  Negative for depression, hallucinations and suicidal ideas.     PHYSICAL EXAMINATION:  GENERAL:  85 y.o.-year-old patient lying in the bed with no acute distress.  NECK:  Supple, no jugular venous distention. No thyroid enlargement, no tenderness.  LUNGS: Normal breath sounds bilaterally, no wheezing, rales,rhonchi  No use of accessory muscles of respiration.  CARDIOVASCULAR: S1, S2 normal. 2/6 murmurs, no rubs, or gallops.  ABDOMEN: Soft, non-tender, non-distended. Bowel sounds present. No organomegaly or mass.  EXTREMITIES: No pedal edema, cyanosis, or clubbing.  PSYCHIATRIC: The patient is alert and oriented x 3.  SKIN: No obvious rash, lesion, or ulcer.   DATA REVIEW:   CBC Recent Labs  Lab 07/26/18 0342  WBC 6.1  HGB 8.3*  HCT 27.2*  PLT 222    Chemistries  Recent Labs  Lab 07/26/18 0342  NA 140  K 4.2  CL 97*  CO2 36*  GLUCOSE 136*  BUN 23  CREATININE 1.93*  CALCIUM 8.5*    Cardiac Enzymes No results for  input(s): TROPONINI in the last 168 hours.  Microbiology Results  @MICRORSLT48 @  RADIOLOGY:  No results found.    Allergies as of 07/26/2018      Reactions   Codeine Other (See Comments)   Constipation      Medication List    STOP taking these medications   cefdinir 300 MG capsule Commonly known as: OMNICEF   torsemide 20 MG tablet Commonly known as: DEMADEX     TAKE these medications   albuterol 108 (90 Base) MCG/ACT inhaler Commonly known as: VENTOLIN HFA Inhale 2 puffs into the lungs every 6 (six) hours as needed for wheezing or shortness of breath.   amLODipine 2.5 MG tablet Commonly known as: NORVASC Take 2.5 mg by mouth daily.   furosemide 20 MG tablet Commonly known as: LASIX Take 1 tablet (20 mg total) by mouth daily.   insulin aspart 100 UNIT/ML injection Commonly known as: novoLOG Inject 20 Units into the skin 3 (three) times daily before meals.   ipratropium-albuterol 0.5-2.5 (3) MG/3ML Soln Commonly known as: DUONEB Take 3 mLs  by nebulization every 6 (six) hours as needed.   iron polysaccharides 150 MG capsule Commonly known as: NIFEREX Take 1 capsule (150 mg total) by mouth daily.   Levemir 100 UNIT/ML injection Generic drug: insulin detemir Inject 0.12 mLs (12 Units total) into the skin daily.   nystatin cream Commonly known as: MYCOSTATIN Apply topically 2 (two) times daily.   pantoprazole 40 MG tablet Commonly known as: Protonix Take 1 tablet (40 mg total) by mouth 2 (two) times daily.   pravastatin 10 MG tablet Commonly known as: PRAVACHOL Take 10 mg by mouth at bedtime.   tamsulosin 0.4 MG Caps capsule Commonly known as: FLOMAX Take 0.4 mg by mouth daily.         Management plans discussed with the patient and he is in agreement. Stable for discharge   Patient should follow up with pcp  CODE STATUS:     Code Status Orders  (From admission, onward)         Start     Ordered   07/25/18 0955  Do not attempt  resuscitation (DNR)  Continuous    Question Answer Comment  In the event of cardiac or respiratory ARREST Do not call a "code blue"   In the event of cardiac or respiratory ARREST Do not perform Intubation, CPR, defibrillation or ACLS   In the event of cardiac or respiratory ARREST Use medication by any route, position, wound care, and other measures to relive pain and suffering. May use oxygen, suction and manual treatment of airway obstruction as needed for comfort.      07/25/18 0954        Code Status History    Date Active Date Inactive Code Status Order ID Comments User Context   07/25/2018 0138 07/25/2018 0954 DNR [id number]  Lance Coon, MD Inpatient   07/06/2018 2054 07/10/2018 1959 DNR [id number]  Henreitta Leber, MD Inpatient   06/27/2018 1150 07/05/2018 1757 DNR [id number]  Lang Snow, NP Inpatient   06/09/2018 1350 06/14/2018 1730 DNR [id number]  Lang Snow, NP ED   05/23/2018 0919 05/28/2018 2024 DNR [id number]  Demetrios Loll, MD Inpatient   05/23/2018 0104 05/23/2018 0918 Full Code [id number]  Mayer Camel, NP Inpatient   Advance Care Planning Activity    Advance Directive Documentation     Most Recent Value  Type of Advance Directive  Living will, Healthcare Power of Attorney, Out of facility DNR (pink MOST or yellow form)  Pre-existing out of facility DNR order (yellow form or pink MOST form)  -  "MOST" Form in Place?  -      TOTAL TIME TAKING CARE OF THIS PATIENT: 38 minutes.    Note: This dictation was prepared with Dragon dictation along with smaller phrase technology. Any transcriptional errors that result from this process are unintentional.  Bettey Costa M.D on 07/26/2018 at 11:47 AM  Between 7am to 6pm - Pager - [phone] After 6pm go to [email or web address] - password [email or web address] [place] [facility]  Office  [phone]  CC: Primary care physician; Baxter Hire, MD

## 2018-07-26 NOTE — Progress Notes (Signed)
   Jonathon Bellows , MD [address] [place], [place], [place], [address] [address], [place], [place], [place], [address] Phone: [phone]  Fax: [phone]   Arthur [name] is being followed for anemia  Day 1 of follow up   Subjective: Denies any melena denies any hematemesis doing well.   Objective: Vital signs in last 24 hours: Vitals:   07/25/18 1318 07/25/18 1644 07/25/18 2310 07/26/18 0829  BP: (!) 157/56 (!) 150/71 (!) 150/64 (!) 173/61  Pulse: 86 67 73 88  Resp: 18 20 16 18   Temp: (!) 97.5 F (36.4 C) 98.5 F (36.9 C) 97.6 F (36.4 C) 100.1 F (37.8 C)  TempSrc: Oral  Oral Oral  SpO2: 100% 98% 97% 94%  Weight:      Height:       Weight change:   Intake/Output Summary (Last 24 hours) at 07/26/2018 5784 Last data filed at 07/26/2018 0546 Gross per 24 hour  Intake 523.96 ml  Output 1450 ml  Net -926.04 ml     Exam: Heart:: Regular rate and rhythm, S1S2 present or without murmur or extra heart sounds Lungs: normal, clear to auscultation and clear to auscultation and percussion Abdomen: soft, nontender, normal bowel sounds   Lab Results: @LABTEST2 @ Micro Results: No results found for this or any previous visit (from the past 240 hour(s)). Studies/Results: No results found. Medications: I have reviewed the patient's current medications. Scheduled Meds: . amLODipine  2.5 mg Oral Daily  . furosemide  20 mg Oral Daily  . insulin aspart  0-5 Units Subcutaneous QHS  . insulin aspart  0-9 Units Subcutaneous TID WC  . mouth rinse  15 mL Mouth Rinse BID  . [START ON 07/28/2018] pantoprazole  40 mg Intravenous Q12H  . pravastatin  10 mg Oral QHS  . tamsulosin  0.4 mg Oral Daily   Continuous Infusions: . pantoprozole (PROTONIX) infusion 8 mg/hr (07/26/18 0916)   PRN Meds:.acetaminophen **OR** acetaminophen, ondansetron **OR** ondansetron (ZOFRAN) IV   Assessment: Principal Problem:   GI bleeding Active Problems:   Diabetes ([place])   Essential  hypertension   COPD (chronic obstructive pulmonary disease) (HCC)   HLD (hyperlipidemia)   Arthur [name] is a 85 y.o. y/o male  recentlyadmitted with a HB 3.6, melena , EGD+colonoscopy showed no lesion to explain the blood loss. Was transfused , stable and discharged.  At the time of discharge his hemoglobin was around 7.3 g.  He was sent back to the ER yesterday by the primary care doctor with a hemoglobin of 7.3 g noted on admission .  There was no change from his prior CBC.  The following morning dropped to 6.6 grams but after an unit of blood went back up to 8.3 grams indicating that the admission Hb of 7.3 grams has been stable and the 6.6 grams result is an error .    Plan  1.  If has active bleeding in terms of melena or hematemesis get a tagged RBC scan.  2. Close follow up with PCP for HB, do not check stool occult testing as iron tablets make it positive. I have spoken to the daughter last evening and explained the plan and she is in agreement.     LOS: 2 days   Jonathon Bellows, MD 07/26/2018, 9:28 AM

## 2018-07-26 NOTE — Progress Notes (Signed)
Pt Daughter will bring clothes for Pt discharge and take the pt home. Discharge instructions given to pt and daughter, verbalized understanding. VSS. IVs removed. No signs of active bleeding this shift. Tolerating diet.

## 2018-07-27 LAB — BPAM RBC
Blood Product Expiration Date: 202008102359
ISSUE DATE / TIME: 202007230925
Unit Type and Rh: 6200

## 2018-07-27 LAB — TYPE AND SCREEN
ABO/RH(D): A POS
Antibody Screen: NEGATIVE
Unit division: 0

## 2018-07-28 DIAGNOSIS — D62 Acute posthemorrhagic anemia: Secondary | ICD-10-CM | POA: Diagnosis not present

## 2018-07-28 DIAGNOSIS — J439 Emphysema, unspecified: Secondary | ICD-10-CM | POA: Diagnosis not present

## 2018-07-28 DIAGNOSIS — K922 Gastrointestinal hemorrhage, unspecified: Secondary | ICD-10-CM | POA: Diagnosis not present

## 2018-07-28 DIAGNOSIS — J9622 Acute and chronic respiratory failure with hypercapnia: Secondary | ICD-10-CM | POA: Diagnosis not present

## 2018-07-28 DIAGNOSIS — I5033 Acute on chronic diastolic (congestive) heart failure: Secondary | ICD-10-CM | POA: Diagnosis not present

## 2018-07-28 DIAGNOSIS — I130 Hypertensive heart and chronic kidney disease with heart failure and stage 1 through stage 4 chronic kidney disease, or unspecified chronic kidney disease: Secondary | ICD-10-CM | POA: Diagnosis not present

## 2018-07-28 DIAGNOSIS — E1122 Type 2 diabetes mellitus with diabetic chronic kidney disease: Secondary | ICD-10-CM | POA: Diagnosis not present

## 2018-07-28 DIAGNOSIS — N183 Chronic kidney disease, stage 3 (moderate): Secondary | ICD-10-CM | POA: Diagnosis not present

## 2018-07-28 DIAGNOSIS — D500 Iron deficiency anemia secondary to blood loss (chronic): Secondary | ICD-10-CM | POA: Diagnosis not present

## 2018-07-28 DIAGNOSIS — J441 Chronic obstructive pulmonary disease with (acute) exacerbation: Secondary | ICD-10-CM | POA: Diagnosis not present

## 2018-07-29 ENCOUNTER — Other Ambulatory Visit: Payer: Self-pay | Admitting: *Deleted

## 2018-07-29 ENCOUNTER — Encounter: Payer: Self-pay | Admitting: *Deleted

## 2018-07-29 DIAGNOSIS — D649 Anemia, unspecified: Secondary | ICD-10-CM | POA: Diagnosis not present

## 2018-07-29 DIAGNOSIS — Z09 Encounter for follow-up examination after completed treatment for conditions other than malignant neoplasm: Secondary | ICD-10-CM | POA: Diagnosis not present

## 2018-07-29 NOTE — Patient Outreach (Signed)
[facility]) [place] Telephone Outreach PCP completes Transition of Care follow up post-hospital discharge Post-hospital discharge day # 3 EMMI Red-Flag notification: unsure if received discharge papers  07/29/2018  Arthur [name] 12-22-1933 [id number]  Successful telephone Arthur [name], daughter/ caregiver/ [place], on [place], for Arthur [name], 85 y/o malereferred to [place] byinpatient RN CM during recent hospital visit July 4-8, 2020 for acute on chronic respiratory failure with hypoxia secondary to acute on chronic CHF; patient was noted to have (R) pleural effusion and had thoracentesis during hospital visit with 1.2 liters of fluid removed. Patient was discharged home to self-care with ongoing home health services through [place] care.Patient has history including, but not limited to, DM; HTN/ HLD; obesity; COPD, on home O2; anemia of unknown origin; chronic respiratory failure; CKD- III.  Unfortunately patient experienced hospital re-admission 14 days after his last hospital discharge, from July 22-24, 2020 for anemia and GI bleeding.  Patient was given one unit of PRBC's while hospitalized and was again discharged home to self-care with established home health services in place.   HIPAA/ identity verified with caregiver today.  Pleasant 25 minute phone call.  Today, caregiver reports that patient "seems to be doing okay."  Caregiver  confirms that she continues visiting with patient several times per day and continues preparing his meals for him; states other family members continue to supervise patient when she is at work.  States that patient had "absolutely no signs/ symptoms of low hemoglobin" and that she was contacted by PCP to take patient to ED when home health nurse checked his lab work last week; caregiver reports that patient "seemed fine and was doing pretty well" prior to being called to go to ED for blood  transfusion.  All discharge instructions were reviewed with caregiver today, including medication changes.  Caregiver further reports:  -- EMMI red-flag notification:  During automated EMMI call, patient responded "unsure" if he received post-hospital discharge instructions; caregiver confirms that she did receive and has reviewed; denies questions around discharge instructions, and specific items of instruction was reviewed with caregiver; caregiver verbalizes frustration with automated calls stating that 'there is an option they offer to have the calls stopped, and I have asked for them to be stopped-- but they just keep coming in."  Stated that patient is easily confused by the calls and does not like talking to the computer voice, but stated that he 'sometimes answers the phone anyway;" Caregiver requests that I have EMMI automated calls to patient stopped.   -- has plans to attend scheduled post-hospital discharge office visit with PCP office later this afternoon: confirms that she will provide transportation and will attend visit with patient ---- discussed with caregiver need for discussion with PCP around management plan for patient's ongoing issues with anemia; we discussed possibility of hematology referral as well as possibility of second opinion from GI provider, as caregiver had previously reported that when they visited with GI provider on 07/15/2018, the doctor "released patient;" caregiver states she does not think that patient should have been released by GI provider given his multiple recent hospitalizations that have revealed anemia/ GI bleeding. ---- re-reviewed with caregiver signs/ symptoms GI bleeding, acute anemia: caregiver is again able to verbalize signs/ symptoms acute anemia with corresponding action plan, however, she stated that patient had "absolutely no signs/ symptoms" with recent episode.  We discussed that if hematology/ GI referrals are made, possibility of patient  obtaining scheduled  lab work/ blood transfusions if indicated; caregiver stated she will discuss with PCP today; coached caregiver on conversation points to keep in  Mind as she requests referrals.  -- home health services continue and have been extended, as we had previously discussed as an option; [facility]; caregiver endorses patient's ongoing active participation in all disciplines  -- caregiver did obtain new scales for patient and has resumed monitoring and recording daily weights; caregiver unable to review specific weight values today, as she is at work and the information is at patient's home; states that home health nurse is regularly reviewing with each visit, and adds that patient's weights "are all steady," with no weight gain of > 3 lbs overnight; continues able to verbalize signs/ symptoms CHF yellow zone along with corresponding action plan  -- has and is taking all medications; denies concerns/ problems around medications:  States that patient was recently prescribed torsemide and discontinued lasix, by renal provider on 07/23/2018- however, before these changes were made, patient was re-admitted to hospital; accurately reports that hospital discharging physician stopped torsemide and re-started lasix; patient was recently discharged from hospital and all medications were reviewed with caregiver today. ---- caregiver accurately reports that patient is using insulin as previously clarified by PCP during office visit 07/15/2018, and she is able to verbalize guidelines recently provided to hold meal time insulin for blood sugars < 100  -- reviewed with caregiver blood sugars this week, and confirmed per caregiver that patient has had "better management" of blood sugars, although he continues to have occasional hypoglycemic episodes at home; caregiver reports this is much better managed now that parameters have bee provided as guidelines for insulin administration.    -- no concerns/  issues/ problems with patient's breathing status: continues using home O2 at 2 L/min; we reviewed signs/ symptoms CHF yellow zone along with corresponding action plan.  Caregiver reports that she purchased patient new scales, and patient has resumed monitoring/ recording daily weights at home; states that both she and home health nurse review daily weights regularly; caregiver unable to review today as she is at work and not near recorded values.  Caregiverdenies further issues, concerns, or problems today. Iconfirmed thatshehasmy direct phone number, the main [facility] CM office phone number, and the [facility] CM 24-hour nurse advice phone number should issues arise prior to next scheduled [place] outreach later this week on caregivers day off,  post- scheduled PCP appointment. Encouraged caregiver to contact me directly if needs, questions, issues, or concerns arise prior to next scheduled outreach; she agreed to do so.  Plan:  Patient will take medications as prescribed and will attend all scheduled provider appointments  Patient will promptly notify care providers for any new concerns/ issues/ problems that arise  Patient will actively participate in home health services as ordered post-hospital discharge  Patient will continue monitoring/recording daily blood sugars 4 times per day  Patient will continue daily weight monitoring and recording at home  I will place printed educational material in mail to patient around self-health management of chronic disease states of anemia and DM  I will ask [facility] CM assistant to have automated EMMI calls to patient stopped  [place] outreach to continue with scheduled phone callto caregiveras previously scheduled later this week, post- scheduled PCP office visit today  [facility] CM Care Plan Problem One     Most Recent Value  Care Plan Problem One  High Risk for hospital readmission, related to/ as evidenced by multiple  recent  hospitalizations for multiple chronic conditions  Role Documenting the Problem One  Care Management Coordinator  Care Plan for Problem One  Active  [facility] Long Term Goal   Over the next 31 days, patient will not experience hospital readmission, as evidenced by patient/ caregiver reporting and review of EMR during [facility] RN CM outreach  [facility] Long Term Goal Start Date  07/29/18 [Goal re-established/ extended due to re-admission]  Interventions for Problem One Long Term Goal  Discussed with caregiver patient's recent hospital re-admission and patient's current clinical condition post-hospital discharge,  confirmed that caregiver has and has reveiwed post-hospital discharge instructions and that she has made recommended changes to patient's medications,  confirmed that patient's family continues actively involved in patient's day to day care at home  [facility] CM Short Term Goal #1   Over the next 30 days, patient will attend all scheduled provider appointments as evidenced by patient/ caregiver reporting and review of EMR during [facility] RN CM outreach  [facility] CM Short Term Goal #1 Start Date  07/12/18  Interventions for Short Term Goal #1  Confirmed that patient has scheduled PCP appointment this afternoon, with plans to attend,  confirmed that caregiver will transport and will attend appointment with patient,  discussed possibility of PCP making referrals for hematology and new GI providers,  coached caregiver on discussing referral with PCP  [facility] CM Short Term Goal #2   Over the next 30 days, patient will actively participate in home health services as ordered post-hospital discharge, as evidenced by patient/ caregiver reporting and collaboration with home health team as indicated during [place] RN CM outreach  [facility] CM Short Term Goal #2 Start Date  07/12/18  Interventions for Short Term Goal #2  Confirmed with caregiver that home health services are ongoing and that home health services have been extended,   confirmed with caregiver that patient continues actively participating in home health services    [facility] CM Care Plan Problem Two     Most Recent Value  Care Plan Problem Two  Self-health management of chronic disease state of CHF, as evidenced by recent hospitalization for CHF exacerbation and caregiver reporting   Role Documenting the Problem Two  Care Management Coordinator  Care Plan for Problem Two  Active  Interventions for Problem Two Long Term Goal   Confirmed that patient has obtained new scales and has begun monitoring and recording daily weights at home,  reviewed with caregiver weight gain guidelines and corresponding action plan in setting of CHF  [facility] Long Term Goal  Over the next 31 days, patient will monitor and record daily weights, as evidenced by review of same/ caregiver reporting during [place] outreach  [facility] Long Term Goal Start Date  07/19/18    [facility] CM Care Plan Problem Three     Most Recent Value  Care Plan Problem Three  Need for ongoing follow up of newly diagnosed anemia/ GI bleeding related to/ as evidenced by multiple recent hospitalizations for anemia  Role Documenting the Problem Three  Care Management Coordinator  Care Plan for Problem Three  Active  [facility] Long Term Goal   Over the next 45 days, patient/ caregiver will verbalize ongoing plan for management of patient's newly diagnosed anemia, as evidenced by caregiver reporting during [place] RN CM outreach   [facility] Long Term Goal Start Date  07/29/18  Interventions for Problem Three Long Term Goal  Discussed with caregiver patient's recent appointments to GI and renal providers,  discussed possibility of hematology referral and need for regular lab work,  encouraged caregiver to discuss plan with PCP during scheduled office visit this afternoon,  placed printed educational material in mail to patient around anemia/ GI bleeding     Oneta Rack, RN, BSN, [place] Coordinator [facility]  Care Management  [phone]

## 2018-07-30 DIAGNOSIS — J9622 Acute and chronic respiratory failure with hypercapnia: Secondary | ICD-10-CM | POA: Diagnosis not present

## 2018-07-30 DIAGNOSIS — D500 Iron deficiency anemia secondary to blood loss (chronic): Secondary | ICD-10-CM | POA: Diagnosis not present

## 2018-07-30 DIAGNOSIS — N183 Chronic kidney disease, stage 3 (moderate): Secondary | ICD-10-CM | POA: Diagnosis not present

## 2018-07-30 DIAGNOSIS — I130 Hypertensive heart and chronic kidney disease with heart failure and stage 1 through stage 4 chronic kidney disease, or unspecified chronic kidney disease: Secondary | ICD-10-CM | POA: Diagnosis not present

## 2018-07-30 DIAGNOSIS — E1122 Type 2 diabetes mellitus with diabetic chronic kidney disease: Secondary | ICD-10-CM | POA: Diagnosis not present

## 2018-07-30 DIAGNOSIS — I5033 Acute on chronic diastolic (congestive) heart failure: Secondary | ICD-10-CM | POA: Diagnosis not present

## 2018-07-30 DIAGNOSIS — D62 Acute posthemorrhagic anemia: Secondary | ICD-10-CM | POA: Diagnosis not present

## 2018-07-30 DIAGNOSIS — J439 Emphysema, unspecified: Secondary | ICD-10-CM | POA: Diagnosis not present

## 2018-07-30 DIAGNOSIS — K922 Gastrointestinal hemorrhage, unspecified: Secondary | ICD-10-CM | POA: Diagnosis not present

## 2018-07-31 DIAGNOSIS — D62 Acute posthemorrhagic anemia: Secondary | ICD-10-CM | POA: Diagnosis not present

## 2018-07-31 DIAGNOSIS — J9622 Acute and chronic respiratory failure with hypercapnia: Secondary | ICD-10-CM | POA: Diagnosis not present

## 2018-07-31 DIAGNOSIS — N183 Chronic kidney disease, stage 3 (moderate): Secondary | ICD-10-CM | POA: Diagnosis not present

## 2018-07-31 DIAGNOSIS — I5033 Acute on chronic diastolic (congestive) heart failure: Secondary | ICD-10-CM | POA: Diagnosis not present

## 2018-07-31 DIAGNOSIS — E1122 Type 2 diabetes mellitus with diabetic chronic kidney disease: Secondary | ICD-10-CM | POA: Diagnosis not present

## 2018-07-31 DIAGNOSIS — J439 Emphysema, unspecified: Secondary | ICD-10-CM | POA: Diagnosis not present

## 2018-07-31 DIAGNOSIS — D500 Iron deficiency anemia secondary to blood loss (chronic): Secondary | ICD-10-CM | POA: Diagnosis not present

## 2018-07-31 DIAGNOSIS — K922 Gastrointestinal hemorrhage, unspecified: Secondary | ICD-10-CM | POA: Diagnosis not present

## 2018-07-31 DIAGNOSIS — I130 Hypertensive heart and chronic kidney disease with heart failure and stage 1 through stage 4 chronic kidney disease, or unspecified chronic kidney disease: Secondary | ICD-10-CM | POA: Diagnosis not present

## 2018-08-01 ENCOUNTER — Ambulatory Visit: Payer: Medicare HMO | Admitting: Gastroenterology

## 2018-08-01 DIAGNOSIS — E1122 Type 2 diabetes mellitus with diabetic chronic kidney disease: Secondary | ICD-10-CM | POA: Diagnosis not present

## 2018-08-01 DIAGNOSIS — J439 Emphysema, unspecified: Secondary | ICD-10-CM | POA: Diagnosis not present

## 2018-08-01 DIAGNOSIS — D62 Acute posthemorrhagic anemia: Secondary | ICD-10-CM | POA: Diagnosis not present

## 2018-08-01 DIAGNOSIS — I130 Hypertensive heart and chronic kidney disease with heart failure and stage 1 through stage 4 chronic kidney disease, or unspecified chronic kidney disease: Secondary | ICD-10-CM | POA: Diagnosis not present

## 2018-08-01 DIAGNOSIS — J9622 Acute and chronic respiratory failure with hypercapnia: Secondary | ICD-10-CM | POA: Diagnosis not present

## 2018-08-01 DIAGNOSIS — D500 Iron deficiency anemia secondary to blood loss (chronic): Secondary | ICD-10-CM | POA: Diagnosis not present

## 2018-08-01 DIAGNOSIS — N183 Chronic kidney disease, stage 3 (moderate): Secondary | ICD-10-CM | POA: Diagnosis not present

## 2018-08-01 DIAGNOSIS — I5033 Acute on chronic diastolic (congestive) heart failure: Secondary | ICD-10-CM | POA: Diagnosis not present

## 2018-08-01 DIAGNOSIS — K922 Gastrointestinal hemorrhage, unspecified: Secondary | ICD-10-CM | POA: Diagnosis not present

## 2018-08-02 ENCOUNTER — Encounter: Payer: Self-pay | Admitting: *Deleted

## 2018-08-02 ENCOUNTER — Other Ambulatory Visit: Payer: Self-pay | Admitting: *Deleted

## 2018-08-02 DIAGNOSIS — J439 Emphysema, unspecified: Secondary | ICD-10-CM | POA: Diagnosis not present

## 2018-08-02 DIAGNOSIS — J9622 Acute and chronic respiratory failure with hypercapnia: Secondary | ICD-10-CM | POA: Diagnosis not present

## 2018-08-02 DIAGNOSIS — K922 Gastrointestinal hemorrhage, unspecified: Secondary | ICD-10-CM | POA: Diagnosis not present

## 2018-08-02 DIAGNOSIS — N183 Chronic kidney disease, stage 3 (moderate): Secondary | ICD-10-CM | POA: Diagnosis not present

## 2018-08-02 DIAGNOSIS — D500 Iron deficiency anemia secondary to blood loss (chronic): Secondary | ICD-10-CM | POA: Diagnosis not present

## 2018-08-02 DIAGNOSIS — I5033 Acute on chronic diastolic (congestive) heart failure: Secondary | ICD-10-CM | POA: Diagnosis not present

## 2018-08-02 DIAGNOSIS — E1122 Type 2 diabetes mellitus with diabetic chronic kidney disease: Secondary | ICD-10-CM | POA: Diagnosis not present

## 2018-08-02 DIAGNOSIS — D62 Acute posthemorrhagic anemia: Secondary | ICD-10-CM | POA: Diagnosis not present

## 2018-08-02 DIAGNOSIS — I130 Hypertensive heart and chronic kidney disease with heart failure and stage 1 through stage 4 chronic kidney disease, or unspecified chronic kidney disease: Secondary | ICD-10-CM | POA: Diagnosis not present

## 2018-08-02 NOTE — Patient Outreach (Signed)
[facility]) [place] Telephone Outreach PCP office completes Transition of Care follow up post-hospital discharge Post-hospital discharge day # 7   08/02/2018  Arthur [name] [birth year] [id number]  12:40 pm: Unsuccessful telephone Arthur [name], daughter/ caregiver/ [place], on [place], for Arthur [name], 85 y/o malereferred to [place] byinpatient RN CM during recent hospital visit July 4-8, 2020 for acute on chronic respiratory failure with hypoxia secondary to acute on chronic CHF; patient was noted to have (R) pleural effusion and had thoracentesis during hospital visit with 1.2 liters of fluid removed. Patient was discharged home to self-care with ongoing home health services through [place] care.Patient has history including, but not limited to, DM; HTN/ HLD; obesity; COPD, on home O2; anemia of unknown origin; chronic respiratory failure; CKD- III.  Unfortunately patient experienced hospital re-admission 14 days after his last hospital discharge, from July 22-24, 2020 for anemia and GI bleeding.  Patient was given one unit of PRBC's while hospitalized and was again discharged home to self-care with established home health services in place.  HIPAA compliant voice mail message left for patient's caregiver, requesting return call back.  1:00 pm:  Caregiver returned my call; HIPAA/ identity verified; caregiver reports that "everything is going great" and states she is out running errands and does not have much time to talk today; HIPAA/ identity verified with caregiver.  Caregiver reports that since his hospital discharge last week, "things are much better;" denies that patient is in pain/ had new/ recent falls.  States "he seems back to his normal self."  Discusses that she "has everything in place" for other family members to care for patient while she is on vacation next week, and that she has also arranged for home health team  to "come more frequently" next week.  Caregiver further reports:  -- attended recent PCP office visit as scheduled Monday 07/29/2018: reviewed with caregiver post-office visit instructions and she reports that patient was taken off Lasix and started on Torsemide; states that insulin was also adjusted as per previous office visit 07/15/2018:  These changes were updated on medication list in EMR per caregiver report.  Confirms that she continues managing patient's medications for him, and once she places in pill box, patient takes independently. States she has prepared patient's medications for him for the entire week next week while she is on vacation. ---- confirms that PCP placed both new GI provider referral as well as hematology referral, and confirms that both provider office visits are scheduled for the week she returns form vacation, on Monday/ Tuesday 8/10 and 8/11, 2020- confirms that she will attend both appointments with patient and will continue to provide transportation  -- home health services continue and have been extended, as we had previously discussed as an option; [facility]; caregiver endorses patient's ongoing active participation in all disciplines; states that she reached out to home health nurse about increasing the home vists next week while she is out of town, and was able to arrange this.  Self-health management of chronic disease states of CHF, DM, and newly diagnosed anemia of unknown origin: -- continues monitoring and recording daily weights at home: again able to verbalize weight gain guidelines in setting of CHF, along with corresponding action plan; states patient's lower extremity swelling 'is much better- it's pretty much gone," since he has been taking torsemide instead of lasix.  Denies issues around patient's breathing status and reports that she continues using home  O2 at 2 L/min "all the time," and SaO2 levels at home are consistently running between 96-98%.   Denies increased use of inhalers/ nebulizer, states "his breathing is fine."  Reports weight values at home have been running between 238-240 lbs consistently over course of week  -- reviewed with caregiver recent blood sugars, and she reports that "they are running much more steady."  Reports that patient has had "one or two episodes low blood sugar where meal time insulin was withheld, as instructed by patient's PCP on 07/15/2018.  Caregiver is able to accurately verbalize insulin dosing as instructed during PCP office visit 07/15/2018.  Continues holding meal time insulin for blood sugars < 100  -- no signs/ symptoms GI bleeding/ anemia:  Caregiver states that home health RN will draw lab work next week and provide to PCP office; caregiver verbalizes some concern that patient "may drop" Hgb/ Hct without being aware, as this happened with last hospitalization.  We reviewed signs/ symptoms anemia/ GI bleeding, which caregiver and patient remain aware of.  Caregiver reports "we just have to wait and see" what next lab work is and "go to see the specialists" when she returns home from vacation.  Caregiverdenies further issues, concerns, or problems today. Iconfirmed thatshehasmy direct phone number, the main [facility] CM office phone number, and the [facility] CM 24-hour nurse advice phone number should issues arise prior to next scheduled [place] outreachpost-upcoming scheduled provider office visits, when caregiver returns from being on vacation.  Offered to caregiver that she could give her family members my phone number, but she declines, sating that the family's plan is to contact her first if there is a problem or concern around patient's condition while she is gone, as she is most familiar with patient's condition and plan of care.  Encouragedcaregiverto contact me directly if needs, questions, issues, or concerns arise prior to next scheduled outreach; she agreed to do so.  Plan:  Patient will  take medications as prescribed and will attend all scheduled provider appointments  Patient will promptly notify care providers for any new concerns/ issues/ problems that arise  Patient will actively participate in home health services as ordered post-hospital discharge  Patient will continue monitoring/recording daily blood sugars 4 times per day  Patient will continue daily weight monitoring and recording at home  [place] outreach to continue with scheduled phone caregiver, post- upcoming scheduled provider office visits  [facility] CM Care Plan Problem One     Most Recent Value  Care Plan Problem One  High Risk for hospital readmission, related to/ as evidenced by multiple recent hospitalizations for multiple chronic conditions  Role Documenting the Problem One  Care Management Coordinator  Care Plan for Problem One  Active  [facility] Long Term Goal   Over the next 31 days, patient will not experience hospital readmission, as evidenced by patient/ caregiver reporting and review of EMR during [facility] RN CM outreach  [facility] Long Term Goal Start Date  07/29/18 [Goal re-established/ extended due to re-admission]  Interventions for Problem One Long Term Goal  Discussed with caregiver patient's current clinical condition and confirmed that she has no current clinical concerns,  discussed with her the plan for patient supervision while she is on vacation next week,  reviewed current medications post- recent PCP office visit 07/29/2018 and updated med list in EMR accordingly  [facility] CM Short Term Goal #1   Over the next 30 days, patient will attend all scheduled provider appointments as evidenced  by patient/ caregiver reporting and review of EMR during [place] RN CM outreach  [facility] CM Short Term Goal #1 Start Date  07/12/18  Interventions for Short Term Goal #1  Reviewed with caregiver recent PCP office visit and confirmed that patient contiues to have reliable transportation to provider office visits  through family members,  confirmed that caregiver is aware of and plans to attend with and transport patient to upcoming provider appointments  [facility] CM Short Term Goal #2   Over the next 30 days, patient will actively participate in home health services as ordered post-hospital discharge, as evidenced by patient/ caregiver reporting and collaboration with home health team as indicated during [place] outreach  [facility] CM Short Term Goal #2 Start Date  07/12/18  Interventions for Short Term Goal #2  Discussed with caregiver plan for home health services while she is on vacation next week,  confirmed that she has arranged additional visits through home health nurse for nursing/ PT/ and bath aide,  confirmed that she has arranged family member coverage to supervise patient while she is out of town next week    [facility] CM Care Plan Problem Two     Most Recent Value  Care Plan Problem Two  Self-health management of chronic disease state of CHF, as evidenced by recent hospitalization for CHF exacerbation and caregiver reporting   Role Documenting the Problem Two  Care Management Coordinator  Care Plan for Problem Two  Active  Interventions for Problem Two Long Term Goal   Confirmed that caregiver has continued monitoring and recording patient's daily weights at home,  discussed patient's weight ranges at home and previous symptoms CHF with caregiver,  confirmed that caregiver has no concerns around patient's breathing status and believes that patient is stable on current plan of care  [facility] Long Term Goal  Over the next 31 days, patient will monitor and record daily weights, as evidenced by review of same/ caregiver reporting during [place] outreach  [facility] Long Term Goal Start Date  07/19/18    [facility] CM Care Plan Problem Three     Most Recent Value  Care Plan Problem Three  Need for ongoing follow up of newly diagnosed anemia/ GI bleeding related to/ as evidenced by multiple recent hospitalizations for  anemia  Role Documenting the Problem Three  Care Management Coordinator  Care Plan for Problem Three  Active  [facility] Long Term Goal   Over the next 45 days, patient/ caregiver will verbalize ongoing plan for management of patient's newly diagnosed anemia, as evidenced by caregiver reporting during [place] RN CM outreach   [facility] Long Term Goal Start Date  07/29/18  Interventions for Problem Three Long Term Goal  Confirmed that caregiver discussed with PCP and has scheduled GI and hematology appointments scheduled with plans to attend all as scheduled     Oneta Rack, RN, BSN, [place] Coordinator [facility] Care Management  [phone]

## 2018-08-03 DIAGNOSIS — J441 Chronic obstructive pulmonary disease with (acute) exacerbation: Secondary | ICD-10-CM | POA: Diagnosis not present

## 2018-08-05 DIAGNOSIS — I130 Hypertensive heart and chronic kidney disease with heart failure and stage 1 through stage 4 chronic kidney disease, or unspecified chronic kidney disease: Secondary | ICD-10-CM | POA: Diagnosis not present

## 2018-08-05 DIAGNOSIS — E1122 Type 2 diabetes mellitus with diabetic chronic kidney disease: Secondary | ICD-10-CM | POA: Diagnosis not present

## 2018-08-05 DIAGNOSIS — N183 Chronic kidney disease, stage 3 (moderate): Secondary | ICD-10-CM | POA: Diagnosis not present

## 2018-08-05 DIAGNOSIS — K922 Gastrointestinal hemorrhage, unspecified: Secondary | ICD-10-CM | POA: Diagnosis not present

## 2018-08-05 DIAGNOSIS — D500 Iron deficiency anemia secondary to blood loss (chronic): Secondary | ICD-10-CM | POA: Diagnosis not present

## 2018-08-05 DIAGNOSIS — Z8719 Personal history of other diseases of the digestive system: Secondary | ICD-10-CM | POA: Diagnosis not present

## 2018-08-05 DIAGNOSIS — I5033 Acute on chronic diastolic (congestive) heart failure: Secondary | ICD-10-CM | POA: Diagnosis not present

## 2018-08-05 DIAGNOSIS — D62 Acute posthemorrhagic anemia: Secondary | ICD-10-CM | POA: Diagnosis not present

## 2018-08-05 DIAGNOSIS — J9622 Acute and chronic respiratory failure with hypercapnia: Secondary | ICD-10-CM | POA: Diagnosis not present

## 2018-08-05 DIAGNOSIS — J439 Emphysema, unspecified: Secondary | ICD-10-CM | POA: Diagnosis not present

## 2018-08-06 DIAGNOSIS — K922 Gastrointestinal hemorrhage, unspecified: Secondary | ICD-10-CM | POA: Diagnosis not present

## 2018-08-06 DIAGNOSIS — I130 Hypertensive heart and chronic kidney disease with heart failure and stage 1 through stage 4 chronic kidney disease, or unspecified chronic kidney disease: Secondary | ICD-10-CM | POA: Diagnosis not present

## 2018-08-06 DIAGNOSIS — N183 Chronic kidney disease, stage 3 (moderate): Secondary | ICD-10-CM | POA: Diagnosis not present

## 2018-08-06 DIAGNOSIS — J9622 Acute and chronic respiratory failure with hypercapnia: Secondary | ICD-10-CM | POA: Diagnosis not present

## 2018-08-06 DIAGNOSIS — J439 Emphysema, unspecified: Secondary | ICD-10-CM | POA: Diagnosis not present

## 2018-08-06 DIAGNOSIS — I5033 Acute on chronic diastolic (congestive) heart failure: Secondary | ICD-10-CM | POA: Diagnosis not present

## 2018-08-06 DIAGNOSIS — D500 Iron deficiency anemia secondary to blood loss (chronic): Secondary | ICD-10-CM | POA: Diagnosis not present

## 2018-08-06 DIAGNOSIS — E1122 Type 2 diabetes mellitus with diabetic chronic kidney disease: Secondary | ICD-10-CM | POA: Diagnosis not present

## 2018-08-06 DIAGNOSIS — D62 Acute posthemorrhagic anemia: Secondary | ICD-10-CM | POA: Diagnosis not present

## 2018-08-07 ENCOUNTER — Emergency Department: Payer: Medicare HMO

## 2018-08-07 ENCOUNTER — Other Ambulatory Visit: Payer: Self-pay

## 2018-08-07 ENCOUNTER — Inpatient Hospital Stay
Admission: EM | Admit: 2018-08-07 | Discharge: 2018-08-09 | DRG: 292 | Disposition: A | Discharge status: Home Health | Payer: Medicare HMO | Attending: Internal Medicine | Admitting: Internal Medicine

## 2018-08-07 DIAGNOSIS — N390 Urinary tract infection, site not specified: Secondary | ICD-10-CM | POA: Diagnosis not present

## 2018-08-07 DIAGNOSIS — N400 Enlarged prostate without lower urinary tract symptoms: Secondary | ICD-10-CM | POA: Diagnosis present

## 2018-08-07 DIAGNOSIS — N189 Chronic kidney disease, unspecified: Secondary | ICD-10-CM | POA: Diagnosis not present

## 2018-08-07 DIAGNOSIS — I5032 Chronic diastolic (congestive) heart failure: Secondary | ICD-10-CM | POA: Diagnosis not present

## 2018-08-07 DIAGNOSIS — D649 Anemia, unspecified: Secondary | ICD-10-CM | POA: Diagnosis present

## 2018-08-07 DIAGNOSIS — K922 Gastrointestinal hemorrhage, unspecified: Secondary | ICD-10-CM | POA: Diagnosis not present

## 2018-08-07 DIAGNOSIS — Z9981 Dependence on supplemental oxygen: Secondary | ICD-10-CM | POA: Diagnosis not present

## 2018-08-07 DIAGNOSIS — F17210 Nicotine dependence, cigarettes, uncomplicated: Secondary | ICD-10-CM | POA: Diagnosis present

## 2018-08-07 DIAGNOSIS — Z885 Allergy status to narcotic agent status: Secondary | ICD-10-CM | POA: Diagnosis not present

## 2018-08-07 DIAGNOSIS — L03119 Cellulitis of unspecified part of limb: Secondary | ICD-10-CM | POA: Diagnosis not present

## 2018-08-07 DIAGNOSIS — R0989 Other specified symptoms and signs involving the circulatory and respiratory systems: Secondary | ICD-10-CM | POA: Diagnosis not present

## 2018-08-07 DIAGNOSIS — Z7189 Other specified counseling: Secondary | ICD-10-CM | POA: Diagnosis not present

## 2018-08-07 DIAGNOSIS — J449 Chronic obstructive pulmonary disease, unspecified: Secondary | ICD-10-CM | POA: Diagnosis present

## 2018-08-07 DIAGNOSIS — J439 Emphysema, unspecified: Secondary | ICD-10-CM | POA: Diagnosis not present

## 2018-08-07 DIAGNOSIS — E119 Type 2 diabetes mellitus without complications: Secondary | ICD-10-CM

## 2018-08-07 DIAGNOSIS — J9811 Atelectasis: Secondary | ICD-10-CM | POA: Diagnosis not present

## 2018-08-07 DIAGNOSIS — D62 Acute posthemorrhagic anemia: Secondary | ICD-10-CM | POA: Diagnosis not present

## 2018-08-07 DIAGNOSIS — Z66 Do not resuscitate: Secondary | ICD-10-CM | POA: Diagnosis present

## 2018-08-07 DIAGNOSIS — J918 Pleural effusion in other conditions classified elsewhere: Secondary | ICD-10-CM | POA: Diagnosis present

## 2018-08-07 DIAGNOSIS — R1011 Right upper quadrant pain: Secondary | ICD-10-CM

## 2018-08-07 DIAGNOSIS — Z794 Long term (current) use of insulin: Secondary | ICD-10-CM | POA: Diagnosis not present

## 2018-08-07 DIAGNOSIS — R52 Pain, unspecified: Secondary | ICD-10-CM | POA: Diagnosis not present

## 2018-08-07 DIAGNOSIS — E1122 Type 2 diabetes mellitus with diabetic chronic kidney disease: Secondary | ICD-10-CM | POA: Diagnosis not present

## 2018-08-07 DIAGNOSIS — E785 Hyperlipidemia, unspecified: Secondary | ICD-10-CM | POA: Diagnosis present

## 2018-08-07 DIAGNOSIS — I5033 Acute on chronic diastolic (congestive) heart failure: Secondary | ICD-10-CM | POA: Diagnosis not present

## 2018-08-07 DIAGNOSIS — D500 Iron deficiency anemia secondary to blood loss (chronic): Secondary | ICD-10-CM | POA: Diagnosis not present

## 2018-08-07 DIAGNOSIS — Z79899 Other long term (current) drug therapy: Secondary | ICD-10-CM | POA: Diagnosis not present

## 2018-08-07 DIAGNOSIS — Z20828 Contact with and (suspected) exposure to other viral communicable diseases: Secondary | ICD-10-CM | POA: Diagnosis not present

## 2018-08-07 DIAGNOSIS — Z515 Encounter for palliative care: Secondary | ICD-10-CM | POA: Diagnosis present

## 2018-08-07 DIAGNOSIS — N183 Chronic kidney disease, stage 3 (moderate): Secondary | ICD-10-CM | POA: Diagnosis present

## 2018-08-07 DIAGNOSIS — Z809 Family history of malignant neoplasm, unspecified: Secondary | ICD-10-CM

## 2018-08-07 DIAGNOSIS — R112 Nausea with vomiting, unspecified: Secondary | ICD-10-CM | POA: Diagnosis not present

## 2018-08-07 DIAGNOSIS — R109 Unspecified abdominal pain: Secondary | ICD-10-CM | POA: Diagnosis not present

## 2018-08-07 DIAGNOSIS — J90 Pleural effusion, not elsewhere classified: Secondary | ICD-10-CM | POA: Diagnosis not present

## 2018-08-07 DIAGNOSIS — I4510 Unspecified right bundle-branch block: Secondary | ICD-10-CM | POA: Diagnosis not present

## 2018-08-07 DIAGNOSIS — N3000 Acute cystitis without hematuria: Secondary | ICD-10-CM | POA: Diagnosis present

## 2018-08-07 DIAGNOSIS — R1084 Generalized abdominal pain: Secondary | ICD-10-CM | POA: Diagnosis not present

## 2018-08-07 DIAGNOSIS — K429 Umbilical hernia without obstruction or gangrene: Secondary | ICD-10-CM | POA: Diagnosis not present

## 2018-08-07 DIAGNOSIS — R918 Other nonspecific abnormal finding of lung field: Secondary | ICD-10-CM | POA: Diagnosis not present

## 2018-08-07 DIAGNOSIS — Z9889 Other specified postprocedural states: Secondary | ICD-10-CM

## 2018-08-07 DIAGNOSIS — I10 Essential (primary) hypertension: Secondary | ICD-10-CM | POA: Diagnosis not present

## 2018-08-07 DIAGNOSIS — N179 Acute kidney failure, unspecified: Secondary | ICD-10-CM | POA: Diagnosis present

## 2018-08-07 DIAGNOSIS — R05 Cough: Secondary | ICD-10-CM

## 2018-08-07 DIAGNOSIS — J9611 Chronic respiratory failure with hypoxia: Secondary | ICD-10-CM | POA: Diagnosis present

## 2018-08-07 DIAGNOSIS — R0602 Shortness of breath: Secondary | ICD-10-CM | POA: Diagnosis not present

## 2018-08-07 DIAGNOSIS — I130 Hypertensive heart and chronic kidney disease with heart failure and stage 1 through stage 4 chronic kidney disease, or unspecified chronic kidney disease: Secondary | ICD-10-CM | POA: Diagnosis not present

## 2018-08-07 DIAGNOSIS — Z825 Family history of asthma and other chronic lower respiratory diseases: Secondary | ICD-10-CM | POA: Diagnosis not present

## 2018-08-07 DIAGNOSIS — K8020 Calculus of gallbladder without cholecystitis without obstruction: Secondary | ICD-10-CM | POA: Diagnosis not present

## 2018-08-07 DIAGNOSIS — R059 Cough, unspecified: Secondary | ICD-10-CM

## 2018-08-07 DIAGNOSIS — J9622 Acute and chronic respiratory failure with hypercapnia: Secondary | ICD-10-CM | POA: Diagnosis not present

## 2018-08-07 HISTORY — DX: Heart failure, unspecified: I50.9

## 2018-08-07 LAB — COMPREHENSIVE METABOLIC PANEL WITH GFR
ALT: 12 U/L (ref 0–44)
AST: 14 U/L — ABNORMAL LOW (ref 15–41)
Albumin: 3.8 g/dL (ref 3.5–5.0)
Alkaline Phosphatase: 65 U/L (ref 38–126)
Anion gap: 12 (ref 5–15)
BUN: 45 mg/dL — ABNORMAL HIGH (ref 8–23)
CO2: 32 mmol/L (ref 22–32)
Calcium: 8.7 mg/dL — ABNORMAL LOW (ref 8.9–10.3)
Chloride: 89 mmol/L — ABNORMAL LOW (ref 98–111)
Creatinine, Ser: 2.48 mg/dL — ABNORMAL HIGH (ref 0.61–1.24)
GFR calc Af Amer: 27 mL/min — ABNORMAL LOW
GFR calc non Af Amer: 23 mL/min — ABNORMAL LOW
Glucose, Bld: 199 mg/dL — ABNORMAL HIGH (ref 70–99)
Potassium: 3.3 mmol/L — ABNORMAL LOW (ref 3.5–5.1)
Sodium: 133 mmol/L — ABNORMAL LOW (ref 135–145)
Total Bilirubin: 0.9 mg/dL (ref 0.3–1.2)
Total Protein: 8 g/dL (ref 6.5–8.1)

## 2018-08-07 LAB — URINALYSIS, ROUTINE W REFLEX MICROSCOPIC
Bilirubin Urine: NEGATIVE
Glucose, UA: NEGATIVE mg/dL
Ketones, ur: 5 mg/dL — AB
Nitrite: NEGATIVE
Protein, ur: 100 mg/dL — AB
Specific Gravity, Urine: 1.011 (ref 1.005–1.030)
WBC, UA: >50 WBC/hpf — ABNORMAL HIGH (ref 0–5)
pH: 6 (ref 5.0–8.0)

## 2018-08-07 LAB — CBC WITH DIFFERENTIAL/PLATELET
Abs Immature Granulocytes: 0.05 10*3/uL (ref 0.00–0.07)
Basophils Absolute: 0.1 10*3/uL (ref 0.0–0.1)
Basophils Relative: 1 %
Eosinophils Absolute: 0 10*3/uL (ref 0.0–0.5)
Eosinophils Relative: 0 %
HCT: 28.6 % — ABNORMAL LOW (ref 39.0–52.0)
Hemoglobin: 9.1 g/dL — ABNORMAL LOW (ref 13.0–17.0)
Immature Granulocytes: 1 %
Lymphocytes Relative: 7 %
Lymphs Abs: 0.7 10*3/uL (ref 0.7–4.0)
MCH: 29.2 pg (ref 26.0–34.0)
MCHC: 31.8 g/dL (ref 30.0–36.0)
MCV: 91.7 fL (ref 80.0–100.0)
Monocytes Absolute: 0.6 10*3/uL (ref 0.1–1.0)
Monocytes Relative: 5 %
Neutro Abs: 9.5 10*3/uL — ABNORMAL HIGH (ref 1.7–7.7)
Neutrophils Relative %: 86 %
Platelets: 286 10*3/uL (ref 150–400)
RBC: 3.12 MIL/uL — ABNORMAL LOW (ref 4.22–5.81)
RDW: 17.8 % — ABNORMAL HIGH (ref 11.5–15.5)
WBC: 10.9 10*3/uL — ABNORMAL HIGH (ref 4.0–10.5)
nRBC: 0 % (ref 0.0–0.2)

## 2018-08-07 LAB — TROPONIN I (HIGH SENSITIVITY)
Troponin I (High Sensitivity): 14 ng/L
Troponin I (High Sensitivity): 17 ng/L (ref <18)

## 2018-08-07 LAB — TYPE AND SCREEN
ABO/RH(D): A POS
Antibody Screen: NEGATIVE

## 2018-08-07 LAB — SARS CORONAVIRUS 2 BY RT PCR (HOSPITAL ORDER, PERFORMED IN ~~LOC~~ HOSPITAL LAB): SARS Coronavirus 2: NEGATIVE

## 2018-08-07 LAB — LIPASE, BLOOD: Lipase: 26 U/L (ref 11–51)

## 2018-08-07 LAB — PROTIME-INR
INR: 1.1 (ref 0.8–1.2)
Prothrombin Time: 13.9 seconds (ref 11.4–15.2)

## 2018-08-07 LAB — GLUCOSE, CAPILLARY: Glucose-Capillary: 207 mg/dL — ABNORMAL HIGH (ref 70–99)

## 2018-08-07 LAB — APTT: aPTT: 32 s (ref 24–36)

## 2018-08-07 LAB — LACTIC ACID, PLASMA: Lactic Acid, Venous: 0.8 mmol/L (ref 0.5–1.9)

## 2018-08-07 MED ORDER — IPRATROPIUM-ALBUTEROL 0.5-2.5 (3) MG/3ML IN SOLN: 3.0000 mL | Freq: Four times a day (QID) | RESPIRATORY_TRACT | Status: DC | PRN

## 2018-08-07 MED ORDER — PRAVASTATIN SODIUM 20 MG PO TABS
10.0000 mg | ORAL_TABLET | Freq: Every day | ORAL | Status: DC
  Administered 2018-08-08 (×2): 10 mg via ORAL
  Filled 2018-08-07 (×2): qty 1

## 2018-08-07 MED ORDER — TAMSULOSIN HCL 0.4 MG PO CAPS
0.4000 mg | ORAL_CAPSULE | Freq: Every day | ORAL | Status: DC
  Administered 2018-08-08 – 2018-08-09 (×2): 0.4 mg via ORAL
  Filled 2018-08-07 (×2): qty 1

## 2018-08-07 MED ORDER — ONDANSETRON HCL 4 MG/2ML IJ SOLN: 4.0000 mg | Freq: Four times a day (QID) | INTRAMUSCULAR | Status: DC | PRN

## 2018-08-07 MED ORDER — LORAZEPAM 2 MG/ML IJ SOLN
0.5000 mg | Freq: Once | INTRAMUSCULAR | Status: AC
  Administered 2018-08-07: 20:00:00 0.5 mg via INTRAVENOUS
  Filled 2018-08-07: qty 1

## 2018-08-07 MED ORDER — OXYCODONE HCL 5 MG PO TABS: 5.0000 mg | ORAL_TABLET | ORAL | Status: DC | PRN

## 2018-08-07 MED ORDER — ONDANSETRON HCL 4 MG/2ML IJ SOLN
4.0000 mg | Freq: Once | INTRAMUSCULAR | Status: AC | PRN
  Administered 2018-08-07: 4 mg via INTRAVENOUS
  Filled 2018-08-07: qty 2

## 2018-08-07 MED ORDER — PANTOPRAZOLE SODIUM 40 MG PO TBEC
40.0000 mg | DELAYED_RELEASE_TABLET | Freq: Two times a day (BID) | ORAL | Status: DC
  Administered 2018-08-08 – 2018-08-09 (×4): 40 mg via ORAL
  Filled 2018-08-07 (×4): qty 1

## 2018-08-07 MED ORDER — INSULIN DETEMIR 100 UNIT/ML ~~LOC~~ SOLN
12.0000 [IU] | Freq: Every day | SUBCUTANEOUS | Status: DC
  Administered 2018-08-08 – 2018-08-09 (×2): 12 [IU] via SUBCUTANEOUS
  Filled 2018-08-07 (×2): qty 0.12

## 2018-08-07 MED ORDER — HEPARIN SODIUM (PORCINE) 5000 UNIT/ML IJ SOLN
5000.0000 [IU] | Freq: Three times a day (TID) | INTRAMUSCULAR | Status: DC
  Administered 2018-08-08: 5000 [IU] via SUBCUTANEOUS
  Filled 2018-08-07: qty 1

## 2018-08-07 MED ORDER — SODIUM CHLORIDE 0.9 % IV SOLN
1.0000 g | Freq: Once | INTRAVENOUS | Status: AC
  Administered 2018-08-07: 22:00:00 1 g via INTRAVENOUS
  Filled 2018-08-07: qty 10

## 2018-08-07 MED ORDER — ACETAMINOPHEN 325 MG PO TABS
650.0000 mg | ORAL_TABLET | Freq: Four times a day (QID) | ORAL | Status: DC | PRN
  Administered 2018-08-08: 650 mg via ORAL
  Filled 2018-08-07: qty 2

## 2018-08-07 MED ORDER — ONDANSETRON HCL 4 MG PO TABS: 4.0000 mg | ORAL_TABLET | Freq: Four times a day (QID) | ORAL | Status: DC | PRN

## 2018-08-07 MED ORDER — INSULIN ASPART 100 UNIT/ML ~~LOC~~ SOLN
0.0000 [IU] | Freq: Three times a day (TID) | SUBCUTANEOUS | Status: DC
  Administered 2018-08-08: 09:00:00 2 [IU] via SUBCUTANEOUS
  Administered 2018-08-08: 13:00:00 11 [IU] via SUBCUTANEOUS
  Filled 2018-08-07: qty 1

## 2018-08-07 MED ORDER — SODIUM CHLORIDE 0.9 % IV SOLN
1.0000 g | INTRAVENOUS | Status: DC
  Administered 2018-08-08: 20:00:00 1 g via INTRAVENOUS
  Filled 2018-08-07: qty 10
  Filled 2018-08-07: qty 1

## 2018-08-07 MED ORDER — POTASSIUM CHLORIDE 10 MEQ/100ML IV SOLN
10.0000 meq | INTRAVENOUS | Status: AC
  Administered 2018-08-07 – 2018-08-08 (×2): 10 meq via INTRAVENOUS
  Filled 2018-08-07 (×2): qty 100

## 2018-08-07 MED ORDER — TORSEMIDE 20 MG PO TABS
40.0000 mg | ORAL_TABLET | Freq: Every day | ORAL | Status: DC
  Administered 2018-08-08: 13:00:00 40 mg via ORAL
  Filled 2018-08-07: qty 2

## 2018-08-07 MED ORDER — ACETAMINOPHEN 650 MG RE SUPP: 650.0000 mg | Freq: Four times a day (QID) | RECTAL | Status: DC | PRN

## 2018-08-07 MED ORDER — AMLODIPINE BESYLATE 5 MG PO TABS
2.5000 mg | ORAL_TABLET | Freq: Every day | ORAL | Status: DC
  Administered 2018-08-08 – 2018-08-09 (×2): 2.5 mg via ORAL
  Filled 2018-08-07 (×2): qty 1

## 2018-08-07 MED ORDER — FENTANYL CITRATE (PF) 100 MCG/2ML IJ SOLN
50.0000 ug | Freq: Once | INTRAMUSCULAR | Status: AC | PRN
  Administered 2018-08-07: 19:00:00 50 ug via INTRAVENOUS
  Filled 2018-08-07: qty 2

## 2018-08-07 MED ORDER — INSULIN ASPART 100 UNIT/ML ~~LOC~~ SOLN
0.0000 [IU] | Freq: Every day | SUBCUTANEOUS | Status: DC
  Administered 2018-08-08: 2 [IU] via SUBCUTANEOUS
  Filled 2018-08-07: qty 1

## 2018-08-07 NOTE — ED Notes (Signed)
Pt leaving for imaging.

## 2018-08-07 NOTE — ED Notes (Signed)
Pt given more warm blankets.  

## 2018-08-07 NOTE — ED Notes (Signed)
Pt assisted to use urinal. Pt's underwear soaked. Pt gave verbal okay for this RN to cut underwear off. Peri care provided. Linens changed. Briefs applied.

## 2018-08-07 NOTE — ED Notes (Signed)
Pt asleep.

## 2018-08-07 NOTE — ED Provider Notes (Signed)
[facility] Emergency Department Provider Note  ____________________________________________   First MD Initiated Contact with Patient 08/07/18 1801     (approximate)  I have reviewed the triage vital signs and the nursing notes.   HISTORY  Chief Complaint Abdominal Pain    HPI Arthur [name] is a 85 y.o. male with COPD on 2 to 3 L of oxygen, CKD, diabetes, hypertension, hyperlipidemia who presents with abdominal pain.  Patient said after eating today he developed right upper quadrant abdominal pain that was severe, constant, wrap to his back, nothing made it better, nothing made it worse.  It was associated with 3 episodes of nonbloody nonbilious vomiting.  Patient was given 50 of fentanyl and 4 Zofran on route.  Patient said his pain is currently minimal at this time.  He endorses his baseline shortness of breath.  No chest pain.  No urinary symptoms.  Bowel movement today was normal.          Past Medical History:  Diagnosis Date   CKD (chronic kidney disease), stage III ([place])    COPD (chronic obstructive pulmonary disease) ([place])    Diabetes mellitus without complication ([place])    Hyperlipidemia    Hypertension     Patient Active Problem List   Diagnosis Date Noted   HLD (hyperlipidemia) 07/24/2018   Acute on chronic respiratory failure with hypoxia ([place]) 07/06/2018   Severe anemia 06/27/2018   GI bleeding 06/09/2018   Sepsis ([place]) 05/23/2018   Cellulitis of right lower extremity 05/22/2018   Diabetes ([place]) 10/24/2006   OBESITY 10/24/2006   Essential hypertension 10/24/2006   ALLERGIC RHINITIS 10/24/2006   COPD (chronic obstructive pulmonary disease) ([place]) 10/24/2006   TOBACCO ABUSE, HX OF 10/24/2006    Past Surgical History:  Procedure Laterality Date   COLONOSCOPY WITH PROPOFOL N/A 06/11/2018   Procedure: COLONOSCOPY WITH PROPOFOL;  Surgeon: Jonathon Bellows, MD;  Location: [facility] ENDOSCOPY;  Service: Gastroenterology;   Laterality: N/A;   COLONOSCOPY WITH PROPOFOL N/A 06/13/2018   Procedure: COLONOSCOPY WITH PROPOFOL;  Surgeon: Jonathon Bellows, MD;  Location: [facility] ENDOSCOPY;  Service: Gastroenterology;  Laterality: N/A;   ESOPHAGOGASTRODUODENOSCOPY Left 06/10/2018   Procedure: ESOPHAGOGASTRODUODENOSCOPY (EGD);  Surgeon: Jonathon Bellows, MD;  Location: [facility] ENDOSCOPY;  Service: Gastroenterology;  Laterality: Left;   GIVENS CAPSULE STUDY N/A 06/28/2018   Procedure: GIVENS CAPSULE STUDY;  Surgeon: Lucilla Lame, MD;  Location: [facility] ENDOSCOPY;  Service: Endoscopy;  Laterality: N/A;   GIVENS CAPSULE STUDY N/A 06/30/2018   Procedure: GIVENS CAPSULE STUDY;  Surgeon: Jonathon Bellows, MD;  Location: [facility] ENDOSCOPY;  Service: Gastroenterology;  Laterality: N/A;    Prior to Admission medications   Medication Sig Start Date End Date Taking? Authorizing Provider  albuterol (VENTOLIN HFA) 108 (90 Base) MCG/ACT inhaler Inhale 2 puffs into the lungs every 6 (six) hours as needed for wheezing or shortness of breath. 05/27/18   Demetrios Loll, MD  amLODipine (NORVASC) 2.5 MG tablet Take 2.5 mg by mouth daily. 04/26/18   [provider]  furosemide (LASIX) 20 MG tablet Take 1 tablet (20 mg total) by mouth daily. Patient not taking: Reported on 08/02/2018 07/05/18 08/04/18  Saundra Shelling, MD  insulin aspart (NOVOLOG) 100 UNIT/ML injection Inject 20 Units into the skin 3 (three) times daily before meals.    [provider]  ipratropium-albuterol (DUONEB) 0.5-2.5 (3) MG/3ML SOLN Take 3 mLs by nebulization every 6 (six) hours as needed. 05/28/18   Bettey Costa, MD  iron polysaccharides (NIFEREX) 150 MG capsule Take 1 capsule (  150 mg total) by mouth daily. 07/11/18   Fritzi Mandes, MD  LEVEMIR 100 UNIT/ML injection Inject 0.12 mLs (12 Units total) into the skin daily. 07/10/18   Fritzi Mandes, MD  nystatin cream (MYCOSTATIN) Apply topically 2 (two) times daily. 05/28/18   Bettey Costa, MD  pantoprazole (PROTONIX) 40 MG tablet Take 1 tablet (40 mg  total) by mouth 2 (two) times daily. 06/14/18   Lang Snow, NP  pravastatin (PRAVACHOL) 10 MG tablet Take 10 mg by mouth at bedtime. 04/25/18   [provider]  tamsulosin (FLOMAX) 0.4 MG CAPS capsule Take 0.4 mg by mouth daily. 04/25/18   [provider]  torsemide (DEMADEX) 20 MG tablet Take 20 mg by mouth daily. Take two every morning for a total dose of 40 mg QD    Baxter Hire, MD    Allergies Codeine  Family History  Problem Relation Age of Onset   COPD Mother    Cancer Father     Social History Social History   Tobacco Use   Smoking status: Former Smoker    Packs/day: 2.00    Years: 35.00    Pack years: 70.00    Types: Cigarettes    Quit date: 11/04/1982    Years since quitting: 35.7   Smokeless tobacco: Current User  Substance Use Topics   Alcohol use: Not Currently   Drug use: No      Review of Systems Constitutional: No fever/chills Eyes: No visual changes. ENT: No sore throat. Cardiovascular: Denies chest pain. Respiratory: Denies shortness of breath. Gastrointestinal: Positive abdominal pain, nausea, vomiting Genitourinary: Negative for dysuria. Musculoskeletal: Negative for back pain. Skin: Negative for rash. Neurological: Negative for headaches, focal weakness or numbness. All other ROS negative ____________________________________________   PHYSICAL EXAM:  VITAL SIGNS: Blood pressure (!) 177/58, pulse 78, temperature 98 F (36.7 C), temperature source Oral, resp. rate (!) 23, height 5\' 10"  (1.778 m), weight 101.2 kg, SpO2 99 %.   Constitutional: Alert and oriented. Well appearing and in no acute distress. Eyes: Conjunctivae are normal. EOMI. Head: Atraumatic. Nose: No congestion/rhinnorhea. Mouth/Throat: Mucous membranes are moist.   Neck: No stridor. Trachea Midline. FROM Cardiovascular: Normal rate, regular rhythm. Grossly normal heart sounds.  Good peripheral circulation. Respiratory: Normal  respiratory effort.  No retractions. Lungs CTAB. Gastrointestinal: Tender in the right upper quadrant no distention. No abdominal bruits.  Musculoskeletal: Legs wrapped up due to edema per patient. no joint effusions. Neurologic:  Normal speech and language. No gross focal neurologic deficits are appreciated.  Skin:  Skin is warm, dry and intact. No rash noted. Psychiatric: Mood and affect are normal. Speech and behavior are normal. GU: Deferred   ____________________________________________   LABS (all labs ordered are listed, but only abnormal results are displayed)  Labs Reviewed  CBC WITH DIFFERENTIAL/PLATELET - Abnormal; Notable for the following components:      Result Value   WBC 10.9 (*)    RBC 3.12 (*)    Hemoglobin 9.1 (*)    HCT 28.6 (*)    RDW 17.8 (*)    Neutro Abs 9.5 (*)    All other components within normal limits  COMPREHENSIVE METABOLIC PANEL - Abnormal; Notable for the following components:   Sodium 133 (*)    Potassium 3.3 (*)    Chloride 89 (*)    Glucose, Bld 199 (*)    BUN 45 (*)    Creatinine, Ser 2.48 (*)    Calcium 8.7 (*)    AST 14 (*)  GFR calc non Af Amer 23 (*)    GFR calc Af Amer 27 (*)    All other components within normal limits  URINE CULTURE  SARS CORONAVIRUS 2 (HOSPITAL ORDER, PERFORMED IN [place] LAB)  LIPASE, BLOOD  PROTIME-INR  APTT  LACTIC ACID, PLASMA  URINALYSIS, ROUTINE W REFLEX MICROSCOPIC  LACTIC ACID, PLASMA  TYPE AND SCREEN  TROPONIN I (HIGH SENSITIVITY)   ____________________________________________   ED ECG REPORT I, Vanessa [place], the attending physician, personally viewed and interpreted this ECG.  EKG sinus rate of 80, no ST elevation, flipped T waves in V1 through V3, aVL, right bundle branch block, occasional PVC.  QTC slightly prolonged at 500. Patient is a very similar EKG previously ____________________________________________  RADIOLOGY Robert Bellow, personally viewed and evaluated these  images (plain radiographs) as part of my medical decision making, as well as reviewing the written report by the radiologist.  ED MD interpretation: Chest x-ray no pneumonia  Official radiology report(s): Ct Abdomen Pelvis Wo Contrast  Result Date: 08/07/2018 CLINICAL DATA:  Abdominal pain, distention and vomiting. EXAM: CT ABDOMEN AND PELVIS WITHOUT CONTRAST TECHNIQUE: Multidetector CT imaging of the abdomen and pelvis was performed following the standard protocol without IV contrast. COMPARISON:  06/09/2018. FINDINGS: Lower chest: Small to moderate-sized right pleural effusion, increased. Bilateral lower lobe atelectasis. Hepatobiliary: 1.9 cm gallstone in the gallbladder. No gallbladder wall thickening or pericholecystic fluid. Normal appearing liver. Pancreas: Unremarkable. No pancreatic ductal dilatation or surrounding inflammatory changes. Spleen: Normal in size without focal abnormality. Adrenals/Urinary Tract: Adrenal glands are unremarkable. Kidneys are normal, without renal calculi, focal lesion, or hydronephrosis. Bladder is unremarkable. Stomach/Bowel: Unremarkable stomach, small bowel and colon. No evidence of appendicitis. Vascular/Lymphatic: Atheromatous arterial calcifications without aneurysm. No enlarged lymph nodes. Reproductive: Mildly enlarged prostate gland. Other: Small to moderate-sized umbilical hernia containing fat. Small left inguinal hernia containing fat. Musculoskeletal: Lumbar and lower thoracic spine degenerative changes. IMPRESSION: 1. No acute abdominal or pelvic abnormality. 2. Small to moderate-sized right pleural effusion, increased. 3. Bilateral lower lobe atelectasis. 4. Cholelithiasis. 5. Small to moderate-sized umbilical hernia containing fat. Electronically Signed   By: Claudie Revering M.D.   On: 08/07/2018 21:14   Dg Chest Portable 1 View  Result Date: 08/07/2018 CLINICAL DATA:  Short of breath EXAM: PORTABLE CHEST 1 VIEW COMPARISON:  07/10/2018, 05/22/2018 FINDINGS:  Small right-sided pleural effusion. Mild cardiomegaly with slight central congestion. Patchy interstitial and alveolar disease at the bases. Aortic atherosclerosis. No pneumothorax. IMPRESSION: 1. Mild cardiomegaly with vascular congestion and small right-sided pleural effusion. Slight increased interstitial opacity could be due to mild edema 2. Patchy airspace disease at the bases, possible superimposed atelectasis or pneumonia Electronically Signed   By: Donavan Foil M.D.   On: 08/07/2018 18:46   US Abdomen Limited Ruq  Result Date: 08/07/2018 CLINICAL DATA:  Right upper quadrant abdominal pain. EXAM: ULTRASOUND ABDOMEN LIMITED RIGHT UPPER QUADRANT COMPARISON:  None. FINDINGS: Gallbladder: Single shadowing gallstone identified measuring roughly 2.5 cm in diameter. No evidence of gallbladder wall thickening or sonographic Murphy sign. Common bile duct: Diameter: 2.4 mm Liver: No focal lesion identified. No evidence of intrahepatic biliary ductal dilatation. Within normal limits in parenchymal echogenicity. Portal vein is patent on color Doppler imaging with normal direction of blood flow towards the liver. Other: None. IMPRESSION: Cholelithiasis with single shadowing gallstone. No evidence of acute cholecystitis or biliary obstruction. Electronically Signed   By: Aletta Edouard M.D.   On: 08/07/2018 20:14    ____________________________________________  PROCEDURES  Procedure(s) performed (including Critical Care):  Procedures   ____________________________________________   INITIAL IMPRESSION / ASSESSMENT AND PLAN / ED COURSE  AITAN ROSSBACH was evaluated in Emergency Department on 08/07/2018 for the symptoms described in the history of present illness. He was evaluated in the context of the global COVID-19 pandemic, which necessitated consideration that the patient might be at risk for infection with the SARS-CoV-2 virus that causes COVID-19. Institutional protocols and algorithms that pertain  to the evaluation of patients at risk for COVID-19 are in a state of rapid change based on information released by regulatory bodies including the CDC and federal and state organizations. These policies and algorithms were followed during the patient's care in the ED.    Patient is an 85 year old who presents with right upper quadrant pain, no prior abdominal surgeries and vomiting.  Will get ultrasound to evaluate for gallbladder pathology.  Will get labs also to look for cholecystitis, pancreatitis.  If work-up is negative will get CT scan to rule out other acute pathology such as SBO, diverticulitis, appendicitis.  Also get EKG and cardiac markers to rule out ACS as well as chest x-ray to rule out pneumonia.   Clinical Course as of Aug 07 1930  Wed Aug 07, 2018  1931 US Abdomen Limited RUQ [MF]    Clinical Course User Index [MF] Vanessa [place], MD    Hemoglobin is around baseline at 9.1.  White count however is elevated at 10.9.  Creatinine is elevated to 2.48.  This is elevated from baseline of 1.93 lipase is normal.  Chest x-ray consistent with mild cardiomegaly with right-sided pleural effusion.  Liver function is normal.  Lactate is normal  Troponin is slightly elevated at 14  7:33 PM patient actively vomiting.  Patient given 0.5 of IV Ativan given his QTC was slightly prolonged he has already received 8 mg of Zofran.  8:50 PM discussed with patient's daughter-in-law.  Patient has had to go up from 2 L to 3 L of oxygen recently.  Given patient's chest x-ray shows worsening edema I suspect that his elevated troponin may be secondary to fluid overload.  Patient's urine is concerning for UTI.  Patient otherwise does not meet sepsis criteria so we will give 1 g of ceftriaxone.  Still waiting on CT scan.  Patient is currently resting in bed and in no acute distress.  CT scan without any other evidence of injury.  Will admit patient for potassium repletion and most likely gentle diuresis as  well as treatment for his UTI.  Discussed with hospital team and they will admit patient.  ____________________________________________   FINAL CLINICAL IMPRESSION(S) / ED DIAGNOSES   Final diagnoses:  RUQ pain  Acute cystitis without hematuria  Acute renal failure superimposed on chronic kidney disease, unspecified CKD stage, unspecified acute renal failure type ([place])      MEDICATIONS GIVEN DURING THIS VISIT:  Medications  fentaNYL (SUBLIMAZE) injection 50 mcg (has no administration in time range)  ondansetron (ZOFRAN) injection 4 mg (has no administration in time range)     ED Discharge Orders    None       Note:  This document was prepared using Dragon voice recognition software and may include unintentional dictation errors.   Vanessa [place], MD 08/07/18 2337

## 2018-08-07 NOTE — ED Notes (Signed)
Pt vomited yellow substance. Pt's face cleansed; given new emesis bag.

## 2018-08-07 NOTE — H&P (Signed)
[facility] NAME: Arthur [name]    MR#:  [id number]  DATE OF BIRTH:  Apr 11, 1933  DATE OF ADMISSION:  08/07/2018  PRIMARY CARE PHYSICIAN: Baxter Hire, MD   REQUESTING/REFERRING PHYSICIAN: Jari Pigg, MD  CHIEF COMPLAINT:   Chief Complaint  Patient presents with  . Abdominal Pain    HISTORY OF PRESENT ILLNESS:  Arthur [name]  is a 85 y.o. male who presents with chief complaint as above.  Patient presents to the ED with a complaint of abdominal pain.  On evaluation here is found of a UTI.  He also has worsening renal function.  He has some slightly worsening pulmonary edema/effusion.  Hospitalist were called for admission  PAST MEDICAL HISTORY:   Past Medical History:  Diagnosis Date  . CHF (congestive heart failure) ([place])   . CKD (chronic kidney disease), stage III ([place])   . COPD (chronic obstructive pulmonary disease) ([place])   . Diabetes mellitus without complication ([place])   . Hyperlipidemia   . Hypertension      PAST SURGICAL HISTORY:   Past Surgical History:  Procedure Laterality Date  . COLONOSCOPY WITH PROPOFOL N/A 06/11/2018   Procedure: COLONOSCOPY WITH PROPOFOL;  Surgeon: Jonathon Bellows, MD;  Location: [facility] ENDOSCOPY;  Service: Gastroenterology;  Laterality: N/A;  . COLONOSCOPY WITH PROPOFOL N/A 06/13/2018   Procedure: COLONOSCOPY WITH PROPOFOL;  Surgeon: Jonathon Bellows, MD;  Location: [facility] ENDOSCOPY;  Service: Gastroenterology;  Laterality: N/A;  . ESOPHAGOGASTRODUODENOSCOPY Left 06/10/2018   Procedure: ESOPHAGOGASTRODUODENOSCOPY (EGD);  Surgeon: Jonathon Bellows, MD;  Location: [facility] ENDOSCOPY;  Service: Gastroenterology;  Laterality: Left;  . GIVENS CAPSULE STUDY N/A 06/28/2018   Procedure: GIVENS CAPSULE STUDY;  Surgeon: Lucilla Lame, MD;  Location: [facility] ENDOSCOPY;  Service: Endoscopy;  Laterality: N/A;  . GIVENS CAPSULE STUDY N/A 06/30/2018   Procedure: GIVENS CAPSULE STUDY;  Surgeon: Jonathon Bellows, MD;  Location: [facility]  ENDOSCOPY;  Service: Gastroenterology;  Laterality: N/A;     SOCIAL HISTORY:   Social History   Tobacco Use  . Smoking status: Former Smoker    Packs/day: 2.00    Years: 35.00    Pack years: 70.00    Types: Cigarettes    Quit date: 11/04/1982    Years since quitting: 35.7  . Smokeless tobacco: Current User  Substance Use Topics  . Alcohol use: Not Currently     FAMILY HISTORY:   Family History  Problem Relation Age of Onset  . COPD Mother   . Cancer Father      DRUG ALLERGIES:   Allergies  Allergen Reactions  . Codeine Other (See Comments)    Constipation    MEDICATIONS AT HOME:   Prior to Admission medications   Medication Sig Start Date End Date Taking? Authorizing Provider  amLODipine (NORVASC) 2.5 MG tablet Take 2.5 mg by mouth daily. 04/26/18  Yes [provider]  insulin aspart (NOVOLOG) 100 UNIT/ML injection Inject 20 Units into the skin 3 (three) times daily before meals.   Yes [provider]  ipratropium-albuterol (DUONEB) 0.5-2.5 (3) MG/3ML SOLN Take 3 mLs by nebulization every 6 (six) hours as needed. 05/28/18  Yes Bettey Costa, MD  iron polysaccharides (NIFEREX) 150 MG capsule Take 1 capsule (150 mg total) by mouth daily. 07/11/18  Yes Fritzi Mandes, MD  LEVEMIR 100 UNIT/ML injection Inject 0.12 mLs (12 Units total) into the skin daily. 07/10/18  Yes Fritzi Mandes, MD  nystatin cream (MYCOSTATIN) Apply topically 2 (two) times daily. 05/28/18  Yes  Bettey Costa, MD  pantoprazole (PROTONIX) 40 MG tablet Take 1 tablet (40 mg total) by mouth 2 (two) times daily. 06/14/18  Yes Lang Snow, NP  pravastatin (PRAVACHOL) 10 MG tablet Take 10 mg by mouth at bedtime. 04/25/18  Yes [provider]  tamsulosin (FLOMAX) 0.4 MG CAPS capsule Take 0.4 mg by mouth daily. 04/25/18  Yes [provider]  torsemide (DEMADEX) 20 MG tablet Take 20 mg by mouth daily. Take two every morning for a total dose of 40 mg QD   Yes Baxter Hire, MD   albuterol (VENTOLIN HFA) 108 (90 Base) MCG/ACT inhaler Inhale 2 puffs into the lungs every 6 (six) hours as needed for wheezing or shortness of breath. 05/27/18   Demetrios Loll, MD  furosemide (LASIX) 20 MG tablet Take 1 tablet (20 mg total) by mouth daily. Patient not taking: Reported on 08/02/2018 07/05/18 08/04/18  Saundra Shelling, MD    REVIEW OF SYSTEMS:  Review of Systems  Constitutional: Negative for chills, fever, malaise/fatigue and weight loss.  HENT: Negative for ear pain, hearing loss and tinnitus.   Eyes: Negative for blurred vision, double vision, pain and redness.  Respiratory: Negative for cough, hemoptysis and shortness of breath.   Cardiovascular: Negative for chest pain, palpitations, orthopnea and leg swelling.  Gastrointestinal: Positive for abdominal pain, nausea and vomiting. Negative for constipation and diarrhea.  Genitourinary: Positive for flank pain. Negative for dysuria, frequency and hematuria.  Musculoskeletal: Negative for back pain, joint pain and neck pain.  Skin:       No acne, rash, or lesions  Neurological: Negative for dizziness, tremors, focal weakness and weakness.  Endo/Heme/Allergies: Negative for polydipsia. Does not bruise/bleed easily.  Psychiatric/Behavioral: Negative for depression. The patient is not nervous/anxious and does not have insomnia.      VITAL SIGNS:   Vitals:   08/07/18 2108 08/07/18 2131 08/07/18 2132 08/07/18 2135  BP: (!) 181/56   (!) 171/75  Pulse: 87 87 87   Resp: (!) 24   (!) 23  Temp:      TempSrc:      SpO2: 99% 98% 96%   Weight:      Height:       Wt Readings from Last 3 Encounters:  08/07/18 101.2 kg  07/25/18 114.4 kg  07/15/18 115.7 kg    PHYSICAL EXAMINATION:  Physical Exam  Vitals reviewed. Constitutional: He is oriented to person, place, and time. He appears well-developed and well-nourished. No distress.  HENT:  Head: Normocephalic and atraumatic.  Mouth/Throat: Oropharynx is clear and moist.  Eyes:  Pupils are equal, round, and reactive to light. Conjunctivae and EOM are normal. No scleral icterus.  Neck: Normal range of motion. Neck supple. No JVD present. No thyromegaly present.  Cardiovascular: Normal rate, regular rhythm and intact distal pulses. Exam reveals no gallop and no friction rub.  No murmur heard. Respiratory: Effort normal and breath sounds normal. No respiratory distress. He has no wheezes. He has no rales.  GI: Soft. Bowel sounds are normal. He exhibits no distension. There is abdominal tenderness.  Musculoskeletal: Normal range of motion.        General: No edema.     Comments: No arthritis, no gout  Lymphadenopathy:    He has no cervical adenopathy.  Neurological: He is alert and oriented to person, place, and time. No cranial nerve deficit.  No dysarthria, no aphasia  Skin: Skin is warm and dry. No rash noted. No erythema.  Psychiatric: He has a  normal mood and affect. His behavior is normal. Judgment and thought content normal.    LABORATORY PANEL:   CBC Recent Labs  Lab 08/07/18 1818  WBC 10.9*  HGB 9.1*  HCT 28.6*  PLT 286   ------------------------------------------------------------------------------------------------------------------  Chemistries  Recent Labs  Lab 08/07/18 1818  NA 133*  K 3.3*  CL 89*  CO2 32  GLUCOSE 199*  BUN 45*  CREATININE 2.48*  CALCIUM 8.7*  AST 14*  ALT 12  ALKPHOS 65  BILITOT 0.9   ------------------------------------------------------------------------------------------------------------------  Cardiac Enzymes No results for input(s): TROPONINI in the last 168 hours. ------------------------------------------------------------------------------------------------------------------  RADIOLOGY:  Ct Abdomen Pelvis Wo Contrast  Result Date: 08/07/2018 CLINICAL DATA:  Abdominal pain, distention and vomiting. EXAM: CT ABDOMEN AND PELVIS WITHOUT CONTRAST TECHNIQUE: Multidetector CT imaging of the abdomen and  pelvis was performed following the standard protocol without IV contrast. COMPARISON:  06/09/2018. FINDINGS: Lower chest: Small to moderate-sized right pleural effusion, increased. Bilateral lower lobe atelectasis. Hepatobiliary: 1.9 cm gallstone in the gallbladder. No gallbladder wall thickening or pericholecystic fluid. Normal appearing liver. Pancreas: Unremarkable. No pancreatic ductal dilatation or surrounding inflammatory changes. Spleen: Normal in size without focal abnormality. Adrenals/Urinary Tract: Adrenal glands are unremarkable. Kidneys are normal, without renal calculi, focal lesion, or hydronephrosis. Bladder is unremarkable. Stomach/Bowel: Unremarkable stomach, small bowel and colon. No evidence of appendicitis. Vascular/Lymphatic: Atheromatous arterial calcifications without aneurysm. No enlarged lymph nodes. Reproductive: Mildly enlarged prostate gland. Other: Small to moderate-sized umbilical hernia containing fat. Small left inguinal hernia containing fat. Musculoskeletal: Lumbar and lower thoracic spine degenerative changes. IMPRESSION: 1. No acute abdominal or pelvic abnormality. 2. Small to moderate-sized right pleural effusion, increased. 3. Bilateral lower lobe atelectasis. 4. Cholelithiasis. 5. Small to moderate-sized umbilical hernia containing fat. Electronically Signed   By: Claudie Revering M.D.   On: 08/07/2018 21:14   Dg Chest Portable 1 View  Result Date: 08/07/2018 CLINICAL DATA:  Short of breath EXAM: PORTABLE CHEST 1 VIEW COMPARISON:  07/10/2018, 05/22/2018 FINDINGS: Small right-sided pleural effusion. Mild cardiomegaly with slight central congestion. Patchy interstitial and alveolar disease at the bases. Aortic atherosclerosis. No pneumothorax. IMPRESSION: 1. Mild cardiomegaly with vascular congestion and small right-sided pleural effusion. Slight increased interstitial opacity could be due to mild edema 2. Patchy airspace disease at the bases, possible superimposed atelectasis or  pneumonia Electronically Signed   By: Donavan Foil M.D.   On: 08/07/2018 18:46   US Abdomen Limited Ruq  Result Date: 08/07/2018 CLINICAL DATA:  Right upper quadrant abdominal pain. EXAM: ULTRASOUND ABDOMEN LIMITED RIGHT UPPER QUADRANT COMPARISON:  None. FINDINGS: Gallbladder: Single shadowing gallstone identified measuring roughly 2.5 cm in diameter. No evidence of gallbladder wall thickening or sonographic Murphy sign. Common bile duct: Diameter: 2.4 mm Liver: No focal lesion identified. No evidence of intrahepatic biliary ductal dilatation. Within normal limits in parenchymal echogenicity. Portal vein is patent on color Doppler imaging with normal direction of blood flow towards the liver. Other: None. IMPRESSION: Cholelithiasis with single shadowing gallstone. No evidence of acute cholecystitis or biliary obstruction. Electronically Signed   By: Aletta Edouard M.D.   On: 08/07/2018 20:14    EKG:   Orders placed or performed during the hospital encounter of 08/07/18  . ED EKG  . ED EKG    IMPRESSION AND PLAN:  Principal Problem:   UTI (urinary tract infection) -urine culture sent, IV antibiotics given Active Problems:   Acute on chronic renal failure (HCC) -likely related to urine infection, versus perhaps some prerenal issue due to his  nausea and vomiting.  Given his slightly increased pleural effusion and possible mild pulmonary edema we will hold off on giving him IV fluids at this time.  Monitor closely and avoid nephrotoxins   Diabetes (HCC) -sliding scale insulin coverage   Essential hypertension -continue home meds, additional antihypertensives PRN for blood pressure goal less than 160/100   COPD (chronic obstructive pulmonary disease) ([place]) -home dose inhalers/nebulizers   HLD (hyperlipidemia) -home dose antilipid  Chart review performed and case discussed with ED provider. Labs, imaging and/or ECG reviewed by provider and discussed with patient/family. Management plans  discussed with the patient and/or family.  COVID-19 status: Tested negative     DVT PROPHYLAXIS: SubQ heparin  GI PROPHYLAXIS:  PPI   ADMISSION STATUS: Inpatient     CODE STATUS: DNR Code Status History    Date Active Date Inactive Code Status Order ID Comments User Context   07/25/2018 [id number] 07/26/2018 1908 DNR [id number]  Bettey Costa, MD Inpatient   07/25/2018 0138 07/25/2018 0954 DNR [id number]  Lance Coon, MD Inpatient   07/06/2018 2054 07/10/2018 1959 DNR [id number]  Henreitta Leber, MD Inpatient   06/27/2018 1150 07/05/2018 1757 DNR [id number]  Lang Snow, NP Inpatient   06/09/2018 1350 06/14/2018 1730 DNR [id number]  Lang Snow, NP ED   05/23/2018 0919 05/28/2018 2024 DNR [id number]  Demetrios Loll, MD Inpatient   05/23/2018 0104 05/23/2018 0918 Full Code [id number]  Mayer Camel, NP Inpatient   Advance Care Planning Activity    Questions for Most Recent Historical Code Status (Order [id number])    Question Answer Comment   In the event of cardiac or respiratory ARREST Do not call a "code blue"    In the event of cardiac or respiratory ARREST Do not perform Intubation, CPR, defibrillation or ACLS    In the event of cardiac or respiratory ARREST Use medication by any route, position, wound care, and other measures to relive pain and suffering. May use oxygen, suction and manual treatment of airway obstruction as needed for comfort.         Advance Directive Documentation     Most Recent Value  Type of Advance Directive  Out of facility DNR (pink MOST or yellow form)  Pre-existing out of facility DNR order (yellow form or pink MOST form)  -  "MOST" Form in Place?  -      TOTAL TIME TAKING CARE OF THIS PATIENT: 45 minutes.   This patient was evaluated in the context of the global COVID-19 pandemic, which necessitated consideration that the patient might be at risk for infection with the SARS-CoV-2 virus that causes COVID-19. Institutional protocols and algorithms that  pertain to the evaluation of patients at risk for COVID-19 are in a state of rapid change based on information released by regulatory bodies including the CDC and federal and state organizations. These policies and algorithms were followed to the best of this provider's knowledge to date during the patient's care at this facility.  Ethlyn Daniels 08/07/2018, 10:24 PM  [facility]  Office  [phone]  CC: Primary care physician; Baxter Hire, MD  Note:  This document was prepared using Dragon voice recognition software and may include unintentional dictation errors.

## 2018-08-07 NOTE — ED Notes (Signed)
Pt given more warm blankets. [place] at bedside.

## 2018-08-07 NOTE — ED Notes (Signed)
EDP [place] at bedside updating pt/family.

## 2018-08-07 NOTE — ED Notes (Signed)
EKG completed

## 2018-08-07 NOTE — ED Notes (Signed)
EDP [place] notified in person that pt actively vomiting and c/o inc in pain.

## 2018-08-07 NOTE — ED Notes (Signed)
Per EDP [place] verbal, pt needs either [place] or CT. Since pt already had CT completed, [place] notified it will likely not be needed anymore.

## 2018-08-07 NOTE — ED Notes (Signed)
ED TO INPATIENT HANDOFF REPORT  ED Nurse Name and Phone #: Metta Clines [phone]  S Name/Age/Gender Arthur [name] 85 y.o. male Room/Bed: ED03A/ED03A  Code Status   Code Status: Prior  Home/SNF/Other Home Patient oriented to: self, place, time and situation Is this baseline? Yes   Triage Complete: Triage complete  Chief Complaint Abd Pain  Triage Note Pt in via EMS from home; dec urine OP; pain across abd/flank; dec hgb recently; home O2 2-3L; 4 Zofran + 78mcg fentanyl with EMS; 197/75; 72 HR; 97% 2-3L O2 with EMS. 20g L ac Iv by EMS. Pt A&Ox4. Pt denies CP. Pt states baseline SOB. History of COPD. DNR at bedside. Swelling in pt's legs; pt wearing boots that he states are used to dec the edema.    Allergies Allergies  Allergen Reactions  . Codeine Other (See Comments)    Constipation    Level of Care/Admitting Diagnosis ED Disposition    ED Disposition Condition Comment   Admit  Hospital Area: [place] [[id number]]  Level of Care: Med-Surg [16]  Covid Evaluation: Confirmed COVID Negative  Diagnosis: UTI (urinary tract infection) [277412]  Admitting Physician: Lance Coon [[id number]]  Attending Physician: Lance Coon [phone]  Estimated length of stay: past midnight tomorrow  Certification:: I certify this patient will need inpatient services for at least 2 midnights  PT Class (Do Not Modify): Inpatient [101]  PT Acc Code (Do Not Modify): Private [1]       B Medical/Surgery History Past Medical History:  Diagnosis Date  . CHF (congestive heart failure) ([place])   . CKD (chronic kidney disease), stage III ([place])   . COPD (chronic obstructive pulmonary disease) ([place])   . Diabetes mellitus without complication ([place])   . Hyperlipidemia   . Hypertension    Past Surgical History:  Procedure Laterality Date  . COLONOSCOPY WITH PROPOFOL N/A 06/11/2018   Procedure: COLONOSCOPY WITH PROPOFOL;  Surgeon: Jonathon Bellows, MD;  Location: [facility] ENDOSCOPY;   Service: Gastroenterology;  Laterality: N/A;  . COLONOSCOPY WITH PROPOFOL N/A 06/13/2018   Procedure: COLONOSCOPY WITH PROPOFOL;  Surgeon: Jonathon Bellows, MD;  Location: [facility] ENDOSCOPY;  Service: Gastroenterology;  Laterality: N/A;  . ESOPHAGOGASTRODUODENOSCOPY Left 06/10/2018   Procedure: ESOPHAGOGASTRODUODENOSCOPY (EGD);  Surgeon: Jonathon Bellows, MD;  Location: [facility] ENDOSCOPY;  Service: Gastroenterology;  Laterality: Left;  . GIVENS CAPSULE STUDY N/A 06/28/2018   Procedure: GIVENS CAPSULE STUDY;  Surgeon: Lucilla Lame, MD;  Location: [facility] ENDOSCOPY;  Service: Endoscopy;  Laterality: N/A;  . GIVENS CAPSULE STUDY N/A 06/30/2018   Procedure: GIVENS CAPSULE STUDY;  Surgeon: Jonathon Bellows, MD;  Location: [facility] ENDOSCOPY;  Service: Gastroenterology;  Laterality: N/A;     A IV Location/Drains/Wounds Patient Lines/Drains/Airways Status   Active Line/Drains/Airways    Name:   Placement date:   Placement time:   Site:   Days:   Peripheral IV 07/24/18 Right Antecubital   07/24/18    2102    Antecubital   14   Peripheral IV 07/25/18 Right Arm   07/25/18    1000    Arm   13   Peripheral IV 08/07/18 Left Antecubital   08/07/18    1802    Antecubital   less than 1   Airway   06/10/18    0944     58          Intake/Output Last 24 hours No intake or output data in the 24 hours ending 08/07/18 2228  Labs/Imaging Results for orders placed or performed during the  hospital encounter of 08/07/18 (from the past 48 hour(s))  CBC with Differential     Status: Abnormal   Collection Time: 08/07/18  6:18 PM  Result Value Ref Range   WBC 10.9 (H) 4.0 - 10.5 K/uL   RBC 3.12 (L) 4.22 - 5.81 MIL/uL   Hemoglobin 9.1 (L) 13.0 - 17.0 g/dL   HCT 28.6 (L) 39.0 - 52.0 %   MCV 91.7 80.0 - 100.0 fL   MCH 29.2 26.0 - 34.0 pg   MCHC 31.8 30.0 - 36.0 g/dL   RDW 17.8 (H) 11.5 - 15.5 %   Platelets 286 150 - 400 K/uL   nRBC 0.0 0.0 - 0.2 %   Neutrophils Relative % 86 %   Neutro Abs 9.5 (H) 1.7 - 7.7 K/uL   Lymphocytes Relative 7  %   Lymphs Abs 0.7 0.7 - 4.0 K/uL   Monocytes Relative 5 %   Monocytes Absolute 0.6 0.1 - 1.0 K/uL   Eosinophils Relative 0 %   Eosinophils Absolute 0.0 0.0 - 0.5 K/uL   Basophils Relative 1 %   Basophils Absolute 0.1 0.0 - 0.1 K/uL   Immature Granulocytes 1 %   Abs Immature Granulocytes 0.05 0.00 - 0.07 K/uL    Comment: Performed at [facility], [address] [place], [place] [address]  Comprehensive metabolic panel     Status: Abnormal   Collection Time: 08/07/18  6:18 PM  Result Value Ref Range   Sodium 133 (L) 135 - 145 mmol/L   Potassium 3.3 (L) 3.5 - 5.1 mmol/L   Chloride 89 (L) 98 - 111 mmol/L   CO2 32 22 - 32 mmol/L   Glucose, Bld 199 (H) 70 - 99 mg/dL   BUN 45 (H) 8 - 23 mg/dL   Creatinine, Ser 2.48 (H) 0.61 - 1.24 mg/dL   Calcium 8.7 (L) 8.9 - 10.3 mg/dL   Total Protein 8.0 6.5 - 8.1 g/dL   Albumin 3.8 3.5 - 5.0 g/dL   AST 14 (L) 15 - 41 U/L   ALT 12 0 - 44 U/L   Alkaline Phosphatase 65 38 - 126 U/L   Total Bilirubin 0.9 0.3 - 1.2 mg/dL   GFR calc non Af Amer 23 (L) >60 mL/min   GFR calc Af Amer 27 (L) >60 mL/min   Anion gap 12 5 - 15    Comment: Performed at [facility], [address] [place], [place] [address]  Lipase, blood     Status: None   Collection Time: 08/07/18  6:18 PM  Result Value Ref Range   Lipase 26 11 - 51 U/L    Comment: Performed at [facility], [address] [place], [place] [address]  Type and screen [place]     Status: None   Collection Time: 08/07/18  6:18 PM  Result Value Ref Range   ABO/RH(D) A POS    Antibody Screen NEG    Sample Expiration      08/10/2018,2359 Performed at [facility], [address] [place], [place] [address]   Protime-INR     Status: None   Collection Time: 08/07/18  6:18 PM  Result Value Ref Range   Prothrombin Time 13.9 11.4 - 15.2 seconds   INR 1.1 0.8 - 1.2    Comment: (NOTE) INR goal varies based on device and disease  states. Performed at [facility], [address] [place], [place] [address]   APTT     Status:  None   Collection Time: 08/07/18  6:18 PM  Result Value Ref Range   aPTT 32 24 - 36 seconds    Comment: Performed at [facility], [address] [place], [place] [address]  Urinalysis, Routine w reflex microscopic     Status: Abnormal   Collection Time: 08/07/18  6:18 PM  Result Value Ref Range   Color, Urine YELLOW (A) YELLOW   APPearance HAZY (A) CLEAR   Specific Gravity, Urine 1.011 1.005 - 1.030   pH 6.0 5.0 - 8.0   Glucose, UA NEGATIVE NEGATIVE mg/dL   Hgb urine dipstick SMALL (A) NEGATIVE   Bilirubin Urine NEGATIVE NEGATIVE   Ketones, ur 5 (A) NEGATIVE mg/dL   Protein, ur 100 (A) NEGATIVE mg/dL   Nitrite NEGATIVE NEGATIVE   Leukocytes,Ua MODERATE (A) NEGATIVE   RBC / HPF 0-5 0 - 5 RBC/hpf   WBC, UA >50 (H) 0 - 5 WBC/hpf   Bacteria, UA MANY (A) NONE SEEN   Squamous Epithelial / LPF 0-5 0 - 5   WBC Clumps PRESENT    Mucus PRESENT    Hyaline Casts, UA PRESENT     Comment: Performed at [facility], [place], [place] [address]  Troponin I (High Sensitivity)     Status: None   Collection Time: 08/07/18  6:18 PM  Result Value Ref Range   Troponin I (High Sensitivity) 14 <18 ng/L    Comment: (NOTE) Elevated high sensitivity troponin I (hsTnI) values and significant  changes across serial measurements may suggest ACS but many other  chronic and acute conditions are known to elevate hsTnI results.  Refer to the "Links" section for chest pain algorithms and additional  guidance. Performed at [facility], [address] [place], [place] [address]   Lactic acid, plasma     Status: None   Collection Time: 08/07/18  6:18 PM  Result Value Ref Range   Lactic Acid, Venous 0.8 0.5 - 1.9 mmol/L    Comment: Performed at [facility], [address] [place], [place] [address]  SARS Coronavirus 2 [facility] order,  Performed in [facility] hospital lab) Nasopharyngeal Nasopharyngeal Swab     Status: None   Collection Time: 08/07/18  6:18 PM   Specimen: Nasopharyngeal Swab  Result Value Ref Range   SARS Coronavirus 2 NEGATIVE NEGATIVE    Comment: (NOTE) If result is NEGATIVE SARS-CoV-2 target nucleic acids are NOT DETECTED. The SARS-CoV-2 RNA is generally detectable in upper and lower  respiratory specimens during the acute phase of infection. The lowest  concentration of SARS-CoV-2 viral copies this assay can detect is 250  copies / mL. A negative result does not preclude SARS-CoV-2 infection  and should not be used as the sole basis for treatment or other  patient management decisions.  A negative result may occur with  improper specimen collection / handling, submission of specimen other  than nasopharyngeal swab, presence of viral mutation(s) within the  areas targeted by this assay, and inadequate number of viral copies  (<250 copies / mL). A negative result must be combined with clinical  observations, patient history, and epidemiological information. If result is POSITIVE SARS-CoV-2 target nucleic acids are DETECTED. The SARS-CoV-2 RNA is generally detectable in upper and lower  respiratory specimens dur ing the acute phase of infection.  Positive  results are indicative of active infection with SARS-CoV-2.  Clinical  correlation with patient history and other diagnostic information is  necessary to determine patient  infection status.  Positive results do  not rule out bacterial infection or co-infection with other viruses. If result is PRESUMPTIVE POSTIVE SARS-CoV-2 nucleic acids MAY BE PRESENT.   A presumptive positive result was obtained on the submitted specimen  and confirmed on repeat testing.  While 2019 novel coronavirus  (SARS-CoV-2) nucleic acids may be present in the submitted sample  additional confirmatory testing may be necessary for epidemiological  and / or clinical  management purposes  to differentiate between  SARS-CoV-2 and other Sarbecovirus currently known to infect humans.  If clinically indicated additional testing with an alternate test  methodology [id number]) is advised. The SARS-CoV-2 RNA is generally  detectable in upper and lower respiratory sp ecimens during the acute  phase of infection. The expected result is Negative. Fact Sheet for Patients:  StrictlyIdeas.no Fact Sheet for Healthcare Providers: BankingDealers.co.za This test is not yet approved or cleared by the [place] FDA and has been authorized for detection and/or diagnosis of SARS-CoV-2 by FDA under an Emergency Use Authorization (EUA).  This EUA will remain in effect (meaning this test can be used) for the duration of the COVID-19 declaration under Section 564(b)(1) of the Act, 21 U.S.C. section 360bbb-3(b)(1), unless the authorization is terminated or revoked sooner. Performed at [facility], [address] [place], [place] [address]    Ct Abdomen Pelvis Wo Contrast  Result Date: 08/07/2018 CLINICAL DATA:  Abdominal pain, distention and vomiting. EXAM: CT ABDOMEN AND PELVIS WITHOUT CONTRAST TECHNIQUE: Multidetector CT imaging of the abdomen and pelvis was performed following the standard protocol without IV contrast. COMPARISON:  06/09/2018. FINDINGS: Lower chest: Small to moderate-sized right pleural effusion, increased. Bilateral lower lobe atelectasis. Hepatobiliary: 1.9 cm gallstone in the gallbladder. No gallbladder wall thickening or pericholecystic fluid. Normal appearing liver. Pancreas: Unremarkable. No pancreatic ductal dilatation or surrounding inflammatory changes. Spleen: Normal in size without focal abnormality. Adrenals/Urinary Tract: Adrenal glands are unremarkable. Kidneys are normal, without renal calculi, focal lesion, or hydronephrosis. Bladder is unremarkable. Stomach/Bowel: Unremarkable stomach,  small bowel and colon. No evidence of appendicitis. Vascular/Lymphatic: Atheromatous arterial calcifications without aneurysm. No enlarged lymph nodes. Reproductive: Mildly enlarged prostate gland. Other: Small to moderate-sized umbilical hernia containing fat. Small left inguinal hernia containing fat. Musculoskeletal: Lumbar and lower thoracic spine degenerative changes. IMPRESSION: 1. No acute abdominal or pelvic abnormality. 2. Small to moderate-sized right pleural effusion, increased. 3. Bilateral lower lobe atelectasis. 4. Cholelithiasis. 5. Small to moderate-sized umbilical hernia containing fat. Electronically Signed   By: Claudie Revering M.D.   On: 08/07/2018 21:14   Dg Chest Portable 1 View  Result Date: 08/07/2018 CLINICAL DATA:  Short of breath EXAM: PORTABLE CHEST 1 VIEW COMPARISON:  07/10/2018, 05/22/2018 FINDINGS: Small right-sided pleural effusion. Mild cardiomegaly with slight central congestion. Patchy interstitial and alveolar disease at the bases. Aortic atherosclerosis. No pneumothorax. IMPRESSION: 1. Mild cardiomegaly with vascular congestion and small right-sided pleural effusion. Slight increased interstitial opacity could be due to mild edema 2. Patchy airspace disease at the bases, possible superimposed atelectasis or pneumonia Electronically Signed   By: Donavan Foil M.D.   On: 08/07/2018 18:46   US Abdomen Limited Ruq  Result Date: 08/07/2018 CLINICAL DATA:  Right upper quadrant abdominal pain. EXAM: ULTRASOUND ABDOMEN LIMITED RIGHT UPPER QUADRANT COMPARISON:  None. FINDINGS: Gallbladder: Single shadowing gallstone identified measuring roughly 2.5 cm in diameter. No evidence of gallbladder wall thickening or sonographic Murphy sign. Common bile duct: Diameter: 2.4 mm Liver: No focal lesion identified. No evidence of intrahepatic biliary ductal  dilatation. Within normal limits in parenchymal echogenicity. Portal vein is patent on color Doppler imaging with normal direction of blood  flow towards the liver. Other: None. IMPRESSION: Cholelithiasis with single shadowing gallstone. No evidence of acute cholecystitis or biliary obstruction. Electronically Signed   By: Aletta Edouard M.D.   On: 08/07/2018 20:14    Pending Labs Unresulted Labs (From admission, onward)    Start     Ordered   08/07/18 1804  Urine culture  ONCE - STAT,   STAT     08/07/18 1804   Signed and Held  CBC  (heparin)  Once,   R    Comments: Baseline for heparin therapy IF NOT ALREADY DRAWN.  Notify MD if PLT < 100 K.    Signed and Held   Signed and Held  Creatinine, serum  (heparin)  Once,   R    Comments: Baseline for heparin therapy IF NOT ALREADY DRAWN.    Signed and Held   Signed and Held  Basic metabolic panel  Tomorrow morning,   R     Signed and Held   Signed and Held  CBC  Tomorrow morning,   R     Signed and Held          Vitals/Pain Today's Vitals   08/07/18 2108 08/07/18 2131 08/07/18 2132 08/07/18 2135  BP: (!) 181/56   (!) 171/75  Pulse: 87 87 87   Resp: (!) 24   (!) 23  Temp:      TempSrc:      SpO2: 99% 98% 96%   Weight:      Height:      PainSc:        Isolation Precautions No active isolations  Medications Medications  cefTRIAXone (ROCEPHIN) 1 g in sodium chloride 0.9 % 100 mL IVPB (1 g Intravenous New Bag/Given 08/07/18 2202)  potassium chloride 10 mEq in 100 mL IVPB (10 mEq Intravenous New Bag/Given 08/07/18 2217)  fentaNYL (SUBLIMAZE) injection 50 mcg (50 mcg Intravenous Given 08/07/18 1843)  ondansetron (ZOFRAN) injection 4 mg (4 mg Intravenous Given 08/07/18 1841)  LORazepam (ATIVAN) injection 0.5 mg (0.5 mg Intravenous Given 08/07/18 1936)    Mobility walks with person assist Moderate fall risk   Focused Assessments Cholelithiasis; R pleural effusion; Bilat lower lobe atelectasis; umbilical hernia; on 2L home oxygen    R Recommendations: See Admitting Provider Note  Report given to:   Additional Notes:

## 2018-08-07 NOTE — ED Notes (Signed)
Kadijah NT assisted pt to use urinal.

## 2018-08-07 NOTE — ED Triage Notes (Addendum)
Pt in via EMS from home; dec urine OP; pain across abd/flank; dec hgb recently; home O2 2-3L; 4 Zofran + 15mcg fentanyl with EMS; 197/75; 72 HR; 97% 2-3L O2 with EMS. 20g L ac Iv by EMS. Pt A&Ox4. Pt denies CP. Pt states baseline SOB. History of COPD. DNR at bedside. Swelling in pt's legs; pt wearing boots that he states are used to dec the edema.

## 2018-08-07 NOTE — ED Notes (Signed)
[place] notified that EDP Jari Pigg would like [place] completed before CT as pt has history of issue with CT/constrast; pt told this to CT staff so didn't have CT completed earlier when staff stopped by.

## 2018-08-08 ENCOUNTER — Other Ambulatory Visit: Payer: Self-pay | Admitting: *Deleted

## 2018-08-08 ENCOUNTER — Inpatient Hospital Stay: Payer: Medicare HMO

## 2018-08-08 DIAGNOSIS — Z7189 Other specified counseling: Secondary | ICD-10-CM

## 2018-08-08 DIAGNOSIS — Z515 Encounter for palliative care: Secondary | ICD-10-CM

## 2018-08-08 LAB — CBC
HCT: 28.8 % — ABNORMAL LOW (ref 39.0–52.0)
Hemoglobin: 9 g/dL — ABNORMAL LOW (ref 13.0–17.0)
MCH: 28.4 pg (ref 26.0–34.0)
MCHC: 31.3 g/dL (ref 30.0–36.0)
MCV: 90.9 fL (ref 80.0–100.0)
Platelets: 274 10*3/uL (ref 150–400)
RBC: 3.17 MIL/uL — ABNORMAL LOW (ref 4.22–5.81)
RDW: 17.4 % — ABNORMAL HIGH (ref 11.5–15.5)
WBC: 10.3 10*3/uL (ref 4.0–10.5)
nRBC: 0 % (ref 0.0–0.2)

## 2018-08-08 LAB — BODY FLUID CELL COUNT WITH DIFFERENTIAL
Eos, Fluid: 2 %
Lymphs, Fluid: 61 %
Monocyte-Macrophage-Serous Fluid: 27 %
Neutrophil Count, Fluid: 10 %
Total Nucleated Cell Count, Fluid: 1122 cu mm

## 2018-08-08 LAB — LACTATE DEHYDROGENASE, PLEURAL OR PERITONEAL FLUID: LD, Fluid: 144 U/L — ABNORMAL HIGH (ref 3–23)

## 2018-08-08 LAB — GLUCOSE, CAPILLARY
Glucose-Capillary: 150 mg/dL — ABNORMAL HIGH (ref 70–99)
Glucose-Capillary: 333 mg/dL — ABNORMAL HIGH (ref 70–99)
Glucose-Capillary: 93 mg/dL (ref 70–99)
Glucose-Capillary: 138 mg/dL — ABNORMAL HIGH (ref 70–99)

## 2018-08-08 LAB — BASIC METABOLIC PANEL
Anion gap: 14 (ref 5–15)
BUN: 41 mg/dL — ABNORMAL HIGH (ref 8–23)
CO2: 30 mmol/L (ref 22–32)
Calcium: 8.6 mg/dL — ABNORMAL LOW (ref 8.9–10.3)
Chloride: 90 mmol/L — ABNORMAL LOW (ref 98–111)
Creatinine, Ser: 2.25 mg/dL — ABNORMAL HIGH (ref 0.61–1.24)
GFR calc Af Amer: 30 mL/min — ABNORMAL LOW (ref >60)
GFR calc non Af Amer: 26 mL/min — ABNORMAL LOW (ref >60)
Glucose, Bld: 190 mg/dL — ABNORMAL HIGH (ref 70–99)
Potassium: 3.7 mmol/L (ref 3.5–5.1)
Sodium: 134 mmol/L — ABNORMAL LOW (ref 135–145)

## 2018-08-08 LAB — PROTEIN, PLEURAL OR PERITONEAL FLUID: Total protein, fluid: 5.1 g/dL

## 2018-08-08 LAB — GLUCOSE, PLEURAL OR PERITONEAL FLUID: Glucose, Fluid: 291 mg/dL

## 2018-08-08 MED ORDER — SODIUM CHLORIDE 0.9 % IV SOLN
INTRAVENOUS | Status: DC | PRN
  Administered 2018-08-08: 250 mL via INTRAVENOUS

## 2018-08-08 NOTE — Procedures (Addendum)
Interventional Radiology Procedure:   Indications: Right pleural effusion  Procedure: US guided thoracentesis  Findings: Removed 575 ml from right chest.  Complications: None     EBL: Less than 10 ml  Plan: Follow up CXR   Arthur [name] R. Anselm Pancoast, MD  Pager: [phone]

## 2018-08-08 NOTE — Care Management Important Message (Signed)
Important Message  Patient Details  Name: Arthur [name] MRN: [id number] Date of Birth: [birth year]   Medicare Important Message Given:  Yes  Initial Medicare IM given by Patient Access Associate on 08/08/2018 at 11:56am.  Still valid.    Dannette Barbara 08/08/2018, 5:17 PM

## 2018-08-08 NOTE — Progress Notes (Signed)
PT Cancellation Note  Patient Details Name: Arthur [name] MRN: [id number] DOB: Jan 15, 1933   Cancelled Treatment:    Reason Eval/Treat Not Completed: Patient declined, no reason specified. Patient is having dinner and is not available.    [address], [place], [place] DPT 08/08/2018, 5:21 PM

## 2018-08-08 NOTE — Patient Outreach (Signed)
[facility]) Care Management [facility] CM Multidisciplinary Team conference 08/08/2018  Arthur [name] [birth year] [id number]   [facility] CM Multidisciplinary Case Discussion re:  Arthur [name], 85 y/o maleref[place] CM during recent hospital visit July 4-8, 2020 for acute on chronic respiratory failure with hypoxia secondary to acute on chronic CHF; patient was noted to have (R) pleural effusion and had thoracentesis during hospital visit with 1.2 liters of fluid removed. Patient was discharged home to self-care with ongoing home health services through [facility].Patient has history including, but not limited to, DM; HTN/ HLD; obesity; COPD, on home O2; anemia of unknown origin; chronic respiratory failure; CKD- III.  Unfortunately patient experienced hospital re-admission 14 days after his last hospital discharge, from July 22-24, 2020 for anemia and GI bleeding. Patient was given one unit of PRBC's while hospitalized and was again discharged home to self-care with established home health services in place.  Noted from review of EMR prior to case discussion that patient was again re-admitted to hospital late last night with (R) UQ pain, acute renal failure, UTI.  At time of writing, patient remains hospitalized.  Barriers: -- 6 recent inpatient hospital admissions for a variety of issues in 85 y/o patient that had never been previously hospitalized: ---- May 21-26, 2020: sepsis/ cellulitis CHF exacerbation ---- June 7-12, 2020: 10 days post- discharge:  Anemia/ GI bleeding: full GI workup completed, no definitive source of GI bleeding found ---- June 25-July 05, 2018: 12 post- discharge: ongoing anemia secondary to GI bleeding of unknown cause ---- July 4-8, 2020: 1 day post discharge: CHF exacerbation: [facility] referral July 10 ---- July 22-24, 2020: 14 days post- discharge: patient called by PCP to go to hospital for admission related to  low Hgb; patient asymtomatic ---- August 07, 2018: 12 days post discharge: UTI; patient remains currently hospitalized  -- lives alone, with numerous family members close by (on same property/ walking distance); supportive family network  -- patient consistently/ adamantly declines SNF rehabilitation visits: is DNR; family reticent to accept option of palliative care, given that prior to recent hospitalizations, patient had been successfully managing at home without prior hospitalizations  -- initial GI provider signed off/ "released" patient from ongoing follow up on July 15, 2018, at time of post-discharge office visit  -- multiple chronic disease states in patient that was previously successfully managing at home without problems:   ---- multiple hypoglycemic episodes at home post- hospital discharges with first 4 hospitalizations; discharge instructions with changes to insulin dosing not discussed with caregiver related to visitation restrictions around COVID-19: addressed and clarified with PCP by [facility] RN CM- improved at time of writing ---- patient/ caregiver have very good baseline understanding of self-health management CHF ---- palliative care option presented to PCP 07/13/2018  [facility] CM Team Discussion: -- renal and GI referrals placed; initial GI provider signed off 07/15/2018- new referral has been placed with scheduled appointment 08/12/2018 -- hematology referral placed; office visit pending; scheduled for 08/13/2018 -- patient asymptomatic with episodes of anemia; full GI work up completed with prior hospitalizations, with no definitive source of GI bleed found -- no overt issues around nutrition suspected -- patient/ family very engaged with PCP, home health team, and [facility] CM team  Plan: -- continue to follow patient closely; continue coaching caregiver around making most of specialist provider appointments -- continue to slowly introduce/ discuss with caregiver option of community  palliative care -- Follow up case discussion  next week for update on current hospital course, upcoming specialists appointments scheduled next week  Oneta Rack, RN, BSN, [place] Care Management  [phone]

## 2018-08-08 NOTE — Consult Note (Signed)
Consultation Note Date: 08/08/2018   Patient Name: Arthur [name]  DOB: 07/31/1933  MRN: [id number]  Age / Sex: 85 y.o., male  PCP: Baxter Hire, MD Referring Physician: Loletha Grayer, MD  Reason for Consultation: Establishing goals of care  HPI/Patient Profile: Arthur [name]  is a 85 y.o. male who presents with chief complaint as above.  Patient presents to the ED with a complaint of abdominal pain.  On evaluation here is found of a UTI.  He also has worsening renal function.  He has some slightly worsening pulmonary edema/effusion.   Clinical Assessment and Goals of Care: Patient is resting in bed. He has just returned from a thoracentesis. He is widowed,  lives alone, and has 2 children. He is a retired Furniture conservator/restorer. He states his children do his house work, yard work, and most of his cooking. He states he "sits and watches t.v." He enjoys Westerns.  Functionally, he uses a walker. He uses 2 lpm of O2 at baseline. He sleeps in a recliner.    We discussed his diagnoses and prognosis.  A detailed discussion was had today regarding advanced directives. The difference between an aggressive medical intervention path and a comfort care path was discussed.  Values and goals of care important to patient and family were attempted to be elicited.  Discussed limitations of medical interventions to prolong quality of life in some situations and discussed the concept of human mortality.    He states his last thoracentesis was a few months ago. He states he has had several hospitalizations recently. He states he hates coming to the hospital and does not believe he would want to return again. He will consider his wishes this evening. Will return in the morning to continue [place] conversation.    SUMMARY OF RECOMMENDATIONS   Continue current care. Will revisit in the morning.   Prognosis:   < 6 months  Discharge  Planning: To Be Determined      Primary Diagnoses: Present on Admission: . HLD (hyperlipidemia) . Essential hypertension . COPD (chronic obstructive pulmonary disease) ([place]) . UTI (urinary tract infection) . Acute on chronic renal failure ([place])   I have reviewed the medical record, interviewed the patient and family, and examined the patient. The following aspects are pertinent.  Past Medical History:  Diagnosis Date  . CHF (congestive heart failure) ([place])   . CKD (chronic kidney disease), stage III ([place])   . COPD (chronic obstructive pulmonary disease) ([place])   . Diabetes mellitus without complication ([place])   . Hyperlipidemia   . Hypertension    Social History   Socioeconomic History  . Marital status: Widowed    Spouse name: Not on file  . Number of children: Not on file  . Years of education: Not on file  . Highest education level: Not on file  Occupational History  . Not on file  Social Needs  . Financial resource strain: Not hard at all  . Food insecurity    Worry: Never true    Inability:  Never true  . Transportation needs    Medical: No    Non-medical: No  Tobacco Use  . Smoking status: Former Smoker    Packs/day: 2.00    Years: 35.00    Pack years: 70.00    Types: Cigarettes    Quit date: 11/04/1982    Years since quitting: 35.7  . Smokeless tobacco: Current User  Substance and Sexual Activity  . Alcohol use: Not Currently  . Drug use: No  . Sexual activity: Not Currently  Lifestyle  . Physical activity    Days per week: 0 days    Minutes per session: Not on file  . Stress: Not at all  Relationships  . Social Herbalist on phone: Not on file    Gets together: Not on file    Attends religious service: Not on file    Active member of club or organization: Not on file    Attends meetings of clubs or organizations: Not on file    Relationship status: Not on file  Other Topics Concern  . Not on file  Social History Narrative  . Not on  file   Family History  Problem Relation Age of Onset  . COPD Mother   . Cancer Father    Scheduled Meds: . amLODipine  2.5 mg Oral Daily  . insulin aspart  0-15 Units Subcutaneous TID WC  . insulin aspart  0-5 Units Subcutaneous QHS  . insulin detemir  12 Units Subcutaneous Daily  . pantoprazole  40 mg Oral BID  . pravastatin  10 mg Oral QHS  . tamsulosin  0.4 mg Oral Daily  . torsemide  40 mg Oral Daily   Continuous Infusions: . cefTRIAXone (ROCEPHIN)  IV     PRN Meds:.acetaminophen **OR** acetaminophen, ipratropium-albuterol, ondansetron **OR** ondansetron (ZOFRAN) IV, oxyCODONE Medications Prior to Admission:  Prior to Admission medications   Medication Sig Start Date End Date Taking? Authorizing Provider  amLODipine (NORVASC) 2.5 MG tablet Take 2.5 mg by mouth daily. 04/26/18  Yes [provider]  insulin aspart (NOVOLOG) 100 UNIT/ML injection Inject 20 Units into the skin 3 (three) times daily before meals.   Yes [provider]  ipratropium-albuterol (DUONEB) 0.5-2.5 (3) MG/3ML SOLN Take 3 mLs by nebulization every 6 (six) hours as needed. 05/28/18  Yes Bettey Costa, MD  iron polysaccharides (NIFEREX) 150 MG capsule Take 1 capsule (150 mg total) by mouth daily. 07/11/18  Yes Fritzi Mandes, MD  LEVEMIR 100 UNIT/ML injection Inject 0.12 mLs (12 Units total) into the skin daily. 07/10/18  Yes Fritzi Mandes, MD  nystatin cream (MYCOSTATIN) Apply topically 2 (two) times daily. 05/28/18  Yes Mody, Ulice Bold, MD  pantoprazole (PROTONIX) 40 MG tablet Take 1 tablet (40 mg total) by mouth 2 (two) times daily. 06/14/18  Yes Lang Snow, NP  pravastatin (PRAVACHOL) 10 MG tablet Take 10 mg by mouth at bedtime. 04/25/18  Yes [provider]  tamsulosin (FLOMAX) 0.4 MG CAPS capsule Take 0.4 mg by mouth daily. 04/25/18  Yes [provider]  torsemide (DEMADEX) 20 MG tablet Take 20 mg by mouth daily. Take two every morning for a total dose of 40 mg QD   Yes  Baxter Hire, MD  albuterol (VENTOLIN HFA) 108 (90 Base) MCG/ACT inhaler Inhale 2 puffs into the lungs every 6 (six) hours as needed for wheezing or shortness of breath. 05/27/18   Demetrios Loll, MD  furosemide (LASIX) 20 MG tablet Take 1 tablet (20 mg total)  by mouth daily. Patient not taking: Reported on 08/02/2018 07/05/18 08/04/18  Saundra Shelling, MD   Allergies  Allergen Reactions  . Codeine Other (See Comments)    Constipation   Review of Systems  All other systems reviewed and are negative.   Physical Exam Pulmonary:     Effort: Pulmonary effort is normal.  Neurological:     Mental Status: He is alert.     Comments: Conversive.      Vital Signs: BP 132/62   Pulse 70   Temp 97.7 F (36.5 C) (Oral)   Resp 18   Ht 5\' 10"  (1.778 m)   Wt 100.6 kg   SpO2 100%   BMI 31.81 kg/m  Pain Scale: 0-10   Pain Score: Asleep   SpO2: SpO2: 100 % O2 Device:SpO2: 100 % O2 Flow Rate: .O2 Flow Rate (L/min): 2 L/min  IO: Intake/output summary:   Intake/Output Summary (Last 24 hours) at 08/08/2018 1621 Last data filed at 08/08/2018 1350 Gross per 24 hour  Intake 480 ml  Output 50 ml  Net 430 ml    LBM: Last BM Date: 08/07/18 Baseline Weight: Weight: 101.2 kg Most recent weight: Weight: 100.6 kg     Palliative Assessment/Data:     Time In: 3:50 Time Out: 4:30 Time Total: 40 min Greater than 50%  of this time was spent counseling and coordinating care related to the above assessment and plan.  Signed by: Asencion Gowda, NP   Please contact Palliative Medicine Team phone at [phone] for questions and concerns.  For individual provider: See Shea Evans

## 2018-08-08 NOTE — TOC Initial Note (Signed)
Transition of Care [facility]) - Initial/Assessment Note    Patient Details  Name: Arthur [name] MRN: [id number] Date of Birth: 18-Feb-1933  [facility]) CM/SW Contact:    Shelbie Hutching, RN Phone Number: 08/08/2018, 2:38 PM  Clinical Narrative:                 Patient is currently off the floor getting thoracentesis.  Patient is from home and lives alone, he is admitted to the hospital for UTI.  Patient is high risk for readmissions- recently discharged 07/26/18.  Patient has chronic O2 2L with adapt, open with [facility] for RN, PT, OT, SW, and aide.  Patient is followed by [facility] case management outpatient.  RNCM will cont to follow.  [facility] is aware of admission.    Expected Discharge Plan: [place] Barriers to Discharge: Continued Medical Work up   Patient Goals and CMS Choice        Expected Discharge Plan and Services Expected Discharge Plan: [place]   Discharge Planning Services: CM Consult Post Acute Care Choice: Resumption of Svcs/PTA Provider Living arrangements for the past 2 months: Single Family Home Expected Discharge Date: 08/10/18                             Date HH Agency Contacted: 08/08/18   Representative spoke with at [place]: Floydene Flock  Prior Living Arrangements/Services Living arrangements for the past 2 months: Single Family Home Lives with:: Self Patient language and need for interpreter reviewed:: No Do you feel safe going back to the place where you live?: Yes      Need for Family Participation in Patient Care: Yes (Comment)(frequent admissions) Care giver support system in place?: Yes (comment)(daughter) Current home services: DME, Homehealth aide, Home PT, Home RN, Home OT(Cane, walker- Education officer, museum) Criminal Activity/Legal Involvement Pertinent to Current Situation/Hospitalization: No - Comment as needed  Activities of Daily Living Home Assistive Devices/Equipment:  Environmental consultant (specify type), Oxygen ADL Screening (condition at time of admission) Patient's cognitive ability adequate to safely complete daily activities?: Yes Is the patient deaf or have difficulty hearing?: No Does the patient have difficulty seeing, even when wearing glasses/contacts?: Yes Does the patient have difficulty concentrating, remembering, or making decisions?: No Patient able to express need for assistance with ADLs?: Yes Does the patient have difficulty dressing or bathing?: Yes Independently performs ADLs?: Yes (appropriate for developmental age) Does the patient have difficulty walking or climbing stairs?: Yes Weakness of Legs: Both Weakness of Arms/Hands: None  Permission Sought/Granted Permission sought to share information with : Case Manager, Other (comment), Family Supports Permission granted to share information with : Yes, Verbal Permission Granted     Permission granted to share info w AGENCY: [place] granted to share info w Relationship: daughter     Emotional Assessment Appearance:: Appears stated age     Orientation: : Oriented to Self, Oriented to Place, Oriented to  Time, Oriented to Situation Alcohol / Substance Use: Not Applicable Psych Involvement: No (comment)  Admission diagnosis:  RUQ pain [R10.11] Patient Active Problem List   Diagnosis Date Noted  . UTI (urinary tract infection) 08/07/2018  . Acute on chronic renal failure ([place]) 08/07/2018  . HLD (hyperlipidemia) 07/24/2018  . Acute on chronic respiratory failure with hypoxia ([place]) 07/06/2018  . Severe anemia 06/27/2018  . GI bleeding 06/09/2018  . Sepsis ([place])  05/23/2018  . Cellulitis of right lower extremity 05/22/2018  . Diabetes ([place]) 10/24/2006  . OBESITY 10/24/2006  . Essential hypertension 10/24/2006  . ALLERGIC RHINITIS 10/24/2006  . COPD (chronic obstructive pulmonary disease) ([place]) 10/24/2006  . TOBACCO ABUSE, HX OF 10/24/2006   PCP:  Baxter Hire,  MD Pharmacy:   [facility] [address] [place] - [place] [address] [place] [address] Phone: [phone] Fax: [phone]     Social Determinants of Health (SDOH) Interventions    Readmission Risk Interventions Readmission Risk Prevention Plan 07/26/2018 07/10/2018 07/07/2018  Transportation Screening Complete - Complete  PCP or Specialist Appt within 5-7 Days - - -  PCP or Specialist Appt within 3-5 Days Complete Complete -  Home Care Screening - - -  Medication Review (RN CM) - - -  [place] or Home Care Consult Complete - Complete  Social Work Consult for [place] Planning/Counseling Complete Complete -  Palliative Care Screening Not Applicable Not Applicable -  Medication Review Press photographer) Complete - Complete  Some recent data might be hidden

## 2018-08-08 NOTE — Progress Notes (Signed)
Patient ID: Arthur [name], male   DOB: 03-Feb-1933, 85 y.o.   MRN: [id number]  [facility] PROGRESS NOTE  JOSEMARIA BRINING BPZ:[id number] DOB: 1933/12/31 DOA: 08/07/2018 PCP: Baxter Hire, MD  HPI/Subjective: Patient's first statement to me is when can I go home.  Patient came in with abdominal pain and found to have a urinary tract infection.  Patient not having any abdominal pain currently.  I spoke with the patient's daughter on the phone and the patient usually does not want to come to the hospital in the first place.  But he had so much pain yesterday that he asked to come.  Objective: Vitals:   08/07/18 2332 08/08/18 0407  BP: (!) 160/52 (!) 145/62  Pulse: 89 79  Resp: 18 17  Temp: 97.6 F (36.4 C) 97.6 F (36.4 C)  SpO2: 97% 98%    Intake/Output Summary (Last 24 hours) at 08/08/2018 1404 Last data filed at 08/08/2018 1350 Gross per 24 hour  Intake 480 ml  Output 50 ml  Net 430 ml   Filed Weights   08/07/18 1828 08/07/18 2332  Weight: 101.2 kg 100.6 kg    ROS: Review of Systems  Constitutional: Negative for chills and fever.  Eyes: Negative for blurred vision.  Respiratory: Positive for cough and shortness of breath.   Cardiovascular: Negative for chest pain.  Gastrointestinal: Negative for abdominal pain, constipation, diarrhea, nausea and vomiting.  Genitourinary: Negative for dysuria.  Musculoskeletal: Negative for joint pain.  Neurological: Negative for dizziness and headaches.   Exam: Physical Exam  Constitutional: He is oriented to person, place, and time.  HENT:  Nose: No mucosal edema.  Mouth/Throat: No oropharyngeal exudate or posterior oropharyngeal edema.  Eyes: Pupils are equal, round, and reactive to light. Conjunctivae, EOM and lids are normal.  Neck: No JVD present. Carotid bruit is not present. No edema present. No thyroid mass and no thyromegaly present.  Cardiovascular: S1 normal and S2 normal. Exam reveals no gallop.  No murmur  heard. Pulses:      Dorsalis pedis pulses are 2+ on the right side and 2+ on the left side.  Respiratory: No respiratory distress. He has decreased breath sounds in the right middle field, the right lower field and the left lower field. He has no wheezes. He has no rhonchi. He has no rales.  GI: Soft. Bowel sounds are normal. There is no abdominal tenderness.  Musculoskeletal:     Right ankle: He exhibits swelling.     Left ankle: He exhibits swelling.  Lymphadenopathy:    He has no cervical adenopathy.  Neurological: He is alert and oriented to person, place, and time. No cranial nerve deficit.  Skin: Skin is warm. No rash noted. Nails show no clubbing.  Psychiatric: He has a normal mood and affect.      Data Reviewed: Basic Metabolic Panel: Recent Labs  Lab 08/07/18 1818 08/08/18 0542  NA 133* 134*  K 3.3* 3.7  CL 89* 90*  CO2 32 30  GLUCOSE 199* 190*  BUN 45* 41*  CREATININE 2.48* 2.25*  CALCIUM 8.7* 8.6*   Liver Function Tests: Recent Labs  Lab 08/07/18 1818  AST 14*  ALT 12  ALKPHOS 65  BILITOT 0.9  PROT 8.0  ALBUMIN 3.8   Recent Labs  Lab 08/07/18 1818  LIPASE 26   CBC: Recent Labs  Lab 08/07/18 1818 08/08/18 0542  WBC 10.9* 10.3  NEUTROABS 9.5*  --   HGB 9.1* 9.0*  HCT 28.6* 28.8*  MCV 91.7 90.9  PLT 286 274   BNP (last 3 results) Recent Labs    07/06/18 1613  BNP 132.0*     CBG: Recent Labs  Lab 08/07/18 2354 08/08/18 0744 08/08/18 1208  GLUCAP 207* 150* 333*    Recent Results (from the past 240 hour(s))  SARS Coronavirus 2 [facility] order, Performed in [facility] hospital lab) Nasopharyngeal Nasopharyngeal Swab     Status: None   Collection Time: 08/07/18  6:18 PM   Specimen: Nasopharyngeal Swab  Result Value Ref Range Status   SARS Coronavirus 2 NEGATIVE NEGATIVE Final    Comment: (NOTE) If result is NEGATIVE SARS-CoV-2 target nucleic acids are NOT DETECTED. The SARS-CoV-2 RNA is generally detectable in upper and lower   respiratory specimens during the acute phase of infection. The lowest  concentration of SARS-CoV-2 viral copies this assay can detect is 250  copies / mL. A negative result does not preclude SARS-CoV-2 infection  and should not be used as the sole basis for treatment or other  patient management decisions.  A negative result may occur with  improper specimen collection / handling, submission of specimen other  than nasopharyngeal swab, presence of viral mutation(s) within the  areas targeted by this assay, and inadequate number of viral copies  (<250 copies / mL). A negative result must be combined with clinical  observations, patient history, and epidemiological information. If result is POSITIVE SARS-CoV-2 target nucleic acids are DETECTED. The SARS-CoV-2 RNA is generally detectable in upper and lower  respiratory specimens dur ing the acute phase of infection.  Positive  results are indicative of active infection with SARS-CoV-2.  Clinical  correlation with patient history and other diagnostic information is  necessary to determine patient infection status.  Positive results do  not rule out bacterial infection or co-infection with other viruses. If result is PRESUMPTIVE POSTIVE SARS-CoV-2 nucleic acids MAY BE PRESENT.   A presumptive positive result was obtained on the submitted specimen  and confirmed on repeat testing.  While 2019 novel coronavirus  (SARS-CoV-2) nucleic acids may be present in the submitted sample  additional confirmatory testing may be necessary for epidemiological  and / or clinical management purposes  to differentiate between  SARS-CoV-2 and other Sarbecovirus currently known to infect humans.  If clinically indicated additional testing with an alternate test  methodology [phone]) is advised. The SARS-CoV-2 RNA is generally  detectable in upper and lower respiratory sp ecimens during the acute  phase of infection. The expected result is Negative. Fact  Sheet for Patients:  StrictlyIdeas.no Fact Sheet for Healthcare Providers: BankingDealers.co.za This test is not yet approved or cleared by the [place] FDA and has been authorized for detection and/or diagnosis of SARS-CoV-2 by FDA under an Emergency Use Authorization (EUA).  This EUA will remain in effect (meaning this test can be used) for the duration of the COVID-19 declaration under Section 564(b)(1) of the Act, 21 U.S.C. section 360bbb-3(b)(1), unless the authorization is terminated or revoked sooner. Performed at [facility], [address] [place], [place] [address]      Studies: Ct Abdomen Pelvis Wo Contrast  Result Date: 08/07/2018 CLINICAL DATA:  Abdominal pain, distention and vomiting. EXAM: CT ABDOMEN AND PELVIS WITHOUT CONTRAST TECHNIQUE: Multidetector CT imaging of the abdomen and pelvis was performed following the standard protocol without IV contrast. COMPARISON:  06/09/2018. FINDINGS: Lower chest: Small to moderate-sized right pleural effusion, increased. Bilateral lower lobe atelectasis. Hepatobiliary: 1.9 cm gallstone in the gallbladder. No gallbladder wall  thickening or pericholecystic fluid. Normal appearing liver. Pancreas: Unremarkable. No pancreatic ductal dilatation or surrounding inflammatory changes. Spleen: Normal in size without focal abnormality. Adrenals/Urinary Tract: Adrenal glands are unremarkable. Kidneys are normal, without renal calculi, focal lesion, or hydronephrosis. Bladder is unremarkable. Stomach/Bowel: Unremarkable stomach, small bowel and colon. No evidence of appendicitis. Vascular/Lymphatic: Atheromatous arterial calcifications without aneurysm. No enlarged lymph nodes. Reproductive: Mildly enlarged prostate gland. Other: Small to moderate-sized umbilical hernia containing fat. Small left inguinal hernia containing fat. Musculoskeletal: Lumbar and lower thoracic spine degenerative changes.  IMPRESSION: 1. No acute abdominal or pelvic abnormality. 2. Small to moderate-sized right pleural effusion, increased. 3. Bilateral lower lobe atelectasis. 4. Cholelithiasis. 5. Small to moderate-sized umbilical hernia containing fat. Electronically Signed   By: Claudie Revering M.D.   On: 08/07/2018 21:14   Dg Chest Portable 1 View  Result Date: 08/07/2018 CLINICAL DATA:  Short of breath EXAM: PORTABLE CHEST 1 VIEW COMPARISON:  07/10/2018, 05/22/2018 FINDINGS: Small right-sided pleural effusion. Mild cardiomegaly with slight central congestion. Patchy interstitial and alveolar disease at the bases. Aortic atherosclerosis. No pneumothorax. IMPRESSION: 1. Mild cardiomegaly with vascular congestion and small right-sided pleural effusion. Slight increased interstitial opacity could be due to mild edema 2. Patchy airspace disease at the bases, possible superimposed atelectasis or pneumonia Electronically Signed   By: Donavan Foil M.D.   On: 08/07/2018 18:46   US Abdomen Limited Ruq  Result Date: 08/07/2018 CLINICAL DATA:  Right upper quadrant abdominal pain. EXAM: ULTRASOUND ABDOMEN LIMITED RIGHT UPPER QUADRANT COMPARISON:  None. FINDINGS: Gallbladder: Single shadowing gallstone identified measuring roughly 2.5 cm in diameter. No evidence of gallbladder wall thickening or sonographic Murphy sign. Common bile duct: Diameter: 2.4 mm Liver: No focal lesion identified. No evidence of intrahepatic biliary ductal dilatation. Within normal limits in parenchymal echogenicity. Portal vein is patent on color Doppler imaging with normal direction of blood flow towards the liver. Other: None. IMPRESSION: Cholelithiasis with single shadowing gallstone. No evidence of acute cholecystitis or biliary obstruction. Electronically Signed   By: Aletta Edouard M.D.   On: 08/07/2018 20:14    Scheduled Meds: . amLODipine  2.5 mg Oral Daily  . insulin aspart  0-15 Units Subcutaneous TID WC  . insulin aspart  0-5 Units Subcutaneous  QHS  . insulin detemir  12 Units Subcutaneous Daily  . pantoprazole  40 mg Oral BID  . pravastatin  10 mg Oral QHS  . tamsulosin  0.4 mg Oral Daily  . torsemide  40 mg Oral Daily   Continuous Infusions: . cefTRIAXone (ROCEPHIN)  IV      Assessment/Plan:  1. Acute cystitis.  Follow-up urine culture.  On IV Rocephin.  Bladder scan to see if he is retaining urine. 2. Recurrent right pleural effusion.  Send for ultrasound-guided thoracentesis and monitor.  Since this is a recurrent pleural effusion we will send off laboratory data and cultures.  I am wondering if this was the cause of the patient's right upper quadrant abdominal pain. 3. Abdominal pain this has resolved.  CT and ultrasound right upper quadrant unremarkable.  Will do a bladder scan to see if he is retaining urine 4. Kidney injury on chronic kidney disease stage III.  Continue to monitor.  Patient on oral torsemide. 5. Type 2 diabetes mellitus on detemir insulin and sliding scale 6. Hypertension on low-dose Norvasc and torsemide 7. BPH on Flomax 8. Hyperlipidemia unspecified on pravastatin 9. Numerous hospitalizations.  Palliative care consultation. 10. Anemia with recent GI bleed.  Not on any blood  thinners at this time.  Stop heparin subcutaneous injection.  Code Status:     Code Status Orders  (From admission, onward)         Start     Ordered   08/07/18 2330  Do not attempt resuscitation (DNR)  Continuous    Question Answer Comment  In the event of cardiac or respiratory ARREST Do not call a "code blue"   In the event of cardiac or respiratory ARREST Do not perform Intubation, CPR, defibrillation or ACLS   In the event of cardiac or respiratory ARREST Use medication by any route, position, wound care, and other measures to relive pain and suffering. May use oxygen, suction and manual treatment of airway obstruction as needed for comfort.      08/07/18 2329        Code Status History    Date Active Date  Inactive Code Status Order ID Comments User Context   07/25/2018 0955 07/26/2018 1908 DNR [id number]  Bettey Costa, MD Inpatient   07/25/2018 0138 07/25/2018 0954 DNR [id number]  Lance Coon, MD Inpatient   07/06/2018 2054 07/10/2018 1959 DNR [id number]  Henreitta Leber, MD Inpatient   06/27/2018 1150 07/05/2018 1757 DNR [id number]  Lang Snow, NP Inpatient   06/09/2018 1350 06/14/2018 1730 DNR [id number]  Lang Snow, NP ED   05/23/2018 0919 05/28/2018 2024 DNR [id number]  Demetrios Loll, MD Inpatient   05/23/2018 0104 05/23/2018 0918 Full Code [id number]  Mayer Camel, NP Inpatient   Advance Care Planning Activity    Advance Directive Documentation     Most Recent Value  Type of Advance Directive  Healthcare Power of Attorney, Living will  Pre-existing out of facility DNR order (yellow form or pink MOST form)  -  "MOST" Form in Place?  -     Family Communication: Spoke with daughter on the phone Disposition Plan: To be determined  Antibiotics:  Rocephin  Time spent: 28 minutes  [place]

## 2018-08-09 LAB — BASIC METABOLIC PANEL
Anion gap: 11 (ref 5–15)
BUN: 47 mg/dL — ABNORMAL HIGH (ref 8–23)
CO2: 32 mmol/L (ref 22–32)
Calcium: 8.4 mg/dL — ABNORMAL LOW (ref 8.9–10.3)
Chloride: 92 mmol/L — ABNORMAL LOW (ref 98–111)
Creatinine, Ser: 2.64 mg/dL — ABNORMAL HIGH (ref 0.61–1.24)
GFR calc Af Amer: 25 mL/min — ABNORMAL LOW (ref >60)
GFR calc non Af Amer: 21 mL/min — ABNORMAL LOW (ref >60)
Glucose, Bld: 110 mg/dL — ABNORMAL HIGH (ref 70–99)
Potassium: 3.2 mmol/L — ABNORMAL LOW (ref 3.5–5.1)
Sodium: 135 mmol/L (ref 135–145)

## 2018-08-09 LAB — GLUCOSE, CAPILLARY
Glucose-Capillary: 114 mg/dL — ABNORMAL HIGH (ref 70–99)
Glucose-Capillary: 236 mg/dL — ABNORMAL HIGH (ref 70–99)

## 2018-08-09 MED ORDER — CEPHALEXIN 250 MG PO CAPS: 250.0000 mg | ORAL_CAPSULE | Freq: Two times a day (BID) | ORAL | 0 refills | Status: AC

## 2018-08-09 MED ORDER — POTASSIUM CHLORIDE CRYS ER 20 MEQ PO TBCR
40.0000 meq | EXTENDED_RELEASE_TABLET | Freq: Once | ORAL | Status: AC
  Administered 2018-08-09: 09:00:00 40 meq via ORAL
  Filled 2018-08-09: qty 2

## 2018-08-09 NOTE — Discharge Summary (Signed)
[facility] [place] NAME: Arthur [name]    MR#:  [id number]  DATE OF BIRTH:  07-17-1933  DATE OF ADMISSION:  08/07/2018 ADMITTING PHYSICIAN: Lance Coon, MD  DATE OF DISCHARGE: 08/09/2018  PRIMARY CARE PHYSICIAN: Baxter Hire, MD    ADMISSION DIAGNOSIS:  RUQ pain [R10.11]  DISCHARGE DIAGNOSIS:  Principal Problem:   UTI (urinary tract infection) Active Problems:   Diabetes ([place])   Essential hypertension   COPD (chronic obstructive pulmonary disease) (HCC)   HLD (hyperlipidemia)   Acute on chronic renal failure ([place])   SECONDARY DIAGNOSIS:   Past Medical History:  Diagnosis Date  . CHF (congestive heart failure) ([place])   . CKD (chronic kidney disease), stage III ([employer])   . COPD (chronic obstructive pulmonary disease) ([place])   . Diabetes mellitus without complication ([place])   . Hyperlipidemia   . Hypertension     HOSPITAL COURSE:  85 year old male with chronic kidney disease stage III, COPD with chronic hypoxic respiratory failure on 2 L of oxygen at home and diabetes who presents emergency room due to abdominal pain.  1.  Abdominal pain: Abdominal pain has subsided.  The recurrent right pleural effusion is felt to be the etiology of the right upper quadrant abdominal pain.  2.  Recurrent right pleural effusion: Patient is status post thoracentesis with approximately 600 cc drained.  3.  UTI: Patient is on Rocephin.  He will be discharged on Keflex.  Urine culture is reintubated for further growth.  4.  Acute on chronic kidney disease stage III: Torsemide has been discontinued.  Patient's PCP should follow-up on patient's creatinine.  He is asked to avoid any nephrotoxic medications including NSAIDs.  5.  BPH: Continue Flomax  6.  Essential hypertension: Continue low-dose Norvasc.  7.  Chronic diastolic heart failure without signs of exacerbation: Torsemide is been discontinued for now due to acute on chronic kidney disease.  He  needs a repeat BMP on Monday. Torsemide may be restarted as an outpatient if deemed appropriate.  8.  Hyperlipidemia: Continue statin  Patient will be discharged home with hospice.  Patient was seen by palliative care services while in the hospital.   DISCHARGE CONDITIONS AND DIET:   Stable Regular diet  CONSULTS OBTAINED:    DRUG ALLERGIES:   Allergies  Allergen Reactions  . Codeine Other (See Comments)    Constipation    DISCHARGE MEDICATIONS:   Allergies as of 08/09/2018      Reactions   Codeine Other (See Comments)   Constipation      Medication List    STOP taking these medications   furosemide 20 MG tablet Commonly known as: LASIX   insulin aspart 100 UNIT/ML injection Commonly known as: novoLOG   torsemide 20 MG tablet Commonly known as: DEMADEX     TAKE these medications   albuterol 108 (90 Base) MCG/ACT inhaler Commonly known as: VENTOLIN HFA Inhale 2 puffs into the lungs every 6 (six) hours as needed for wheezing or shortness of breath.   amLODipine 2.5 MG tablet Commonly known as: NORVASC Take 2.5 mg by mouth daily.   cephALEXin 250 MG capsule Commonly known as: KEFLEX Take 1 capsule (250 mg total) by mouth 2 (two) times daily for 3 days.   ipratropium-albuterol 0.5-2.5 (3) MG/3ML Soln Commonly known as: DUONEB Take 3 mLs by nebulization every 6 (six) hours as needed.   iron polysaccharides 150 MG capsule Commonly known as: NIFEREX Take 1 capsule (150 mg  total) by mouth daily.   Levemir 100 UNIT/ML injection Generic drug: insulin detemir Inject 0.12 mLs (12 Units total) into the skin daily.   nystatin cream Commonly known as: MYCOSTATIN Apply topically 2 (two) times daily.   pantoprazole 40 MG tablet Commonly known as: Protonix Take 1 tablet (40 mg total) by mouth 2 (two) times daily.   pravastatin 10 MG tablet Commonly known as: PRAVACHOL Take 10 mg by mouth at bedtime.   tamsulosin 0.4 MG Caps capsule Commonly known as:  FLOMAX Take 0.4 mg by mouth daily.         Today   CHIEF COMPLAINT:  Doing well I want to go home   VITAL SIGNS:  Blood pressure (!) 138/58, pulse 81, temperature 98.7 F (37.1 C), temperature source Oral, resp. rate 17, height 5\' 10"  (1.778 m), weight 100.6 kg, SpO2 97 %.   REVIEW OF SYSTEMS:  Review of Systems  Constitutional: Negative.  Negative for chills, fever and malaise/fatigue.  HENT: Negative.  Negative for ear discharge, ear pain, hearing loss, nosebleeds and sore throat.   Eyes: Negative.  Negative for blurred vision and pain.  Respiratory: Negative.  Negative for cough, hemoptysis, shortness of breath and wheezing.   Cardiovascular: Negative.  Negative for chest pain, palpitations and leg swelling.  Gastrointestinal: Negative.  Negative for abdominal pain, blood in stool, diarrhea, nausea and vomiting.  Genitourinary: Negative.  Negative for dysuria.  Musculoskeletal: Negative.  Negative for back pain.  Skin: Negative.   Neurological: Negative for dizziness, tremors, speech change, focal weakness, seizures and headaches.  Endo/Heme/Allergies: Negative.  Does not bruise/bleed easily.  Psychiatric/Behavioral: Negative.  Negative for depression, hallucinations and suicidal ideas.     PHYSICAL EXAMINATION:  GENERAL:  85 y.o.-year-old patient lying in the bed with no acute distress.  NECK:  Supple, no jugular venous distention. No thyroid enlargement, no tenderness.  LUNGS: Normal breath sounds bilaterally, no wheezing, rales,rhonchi  No use of accessory muscles of respiration.  CARDIOVASCULAR: S1, S2 normal. No murmurs, rubs, or gallops.  ABDOMEN: Soft, non-tender, non-distended. Bowel sounds present. No organomegaly or mass.  EXTREMITIES: No pedal edema, cyanosis, or clubbing.  PSYCHIATRIC: The patient is alert and oriented x 3.  SKIN: No obvious rash, lesion, or ulcer.   DATA REVIEW:   CBC Recent Labs  Lab 08/08/18 0542  WBC 10.3  HGB 9.0*  HCT 28.8*   PLT 274    Chemistries  Recent Labs  Lab 08/07/18 1818  08/09/18 0510  NA 133*   < > 135  K 3.3*   < > 3.2*  CL 89*   < > 92*  CO2 32   < > 32  GLUCOSE 199*   < > 110*  BUN 45*   < > 47*  CREATININE 2.48*   < > 2.64*  CALCIUM 8.7*   < > 8.4*  AST 14*  --   --   ALT 12  --   --   ALKPHOS 65  --   --   BILITOT 0.9  --   --    < > = values in this interval not displayed.    Cardiac Enzymes No results for input(s): TROPONINI in the last 168 hours.  Microbiology Results  @MICRORSLT48 @  RADIOLOGY:  Ct Abdomen Pelvis Wo Contrast  Result Date: 08/07/2018 CLINICAL DATA:  Abdominal pain, distention and vomiting. EXAM: CT ABDOMEN AND PELVIS WITHOUT CONTRAST TECHNIQUE: Multidetector CT imaging of the abdomen and pelvis was performed following the standard protocol without IV  contrast. COMPARISON:  06/09/2018. FINDINGS: Lower chest: Small to moderate-sized right pleural effusion, increased. Bilateral lower lobe atelectasis. Hepatobiliary: 1.9 cm gallstone in the gallbladder. No gallbladder wall thickening or pericholecystic fluid. Normal appearing liver. Pancreas: Unremarkable. No pancreatic ductal dilatation or surrounding inflammatory changes. Spleen: Normal in size without focal abnormality. Adrenals/Urinary Tract: Adrenal glands are unremarkable. Kidneys are normal, without renal calculi, focal lesion, or hydronephrosis. Bladder is unremarkable. Stomach/Bowel: Unremarkable stomach, small bowel and colon. No evidence of appendicitis. Vascular/Lymphatic: Atheromatous arterial calcifications without aneurysm. No enlarged lymph nodes. Reproductive: Mildly enlarged prostate gland. Other: Small to moderate-sized umbilical hernia containing fat. Small left inguinal hernia containing fat. Musculoskeletal: Lumbar and lower thoracic spine degenerative changes. IMPRESSION: 1. No acute abdominal or pelvic abnormality. 2. Small to moderate-sized right pleural effusion, increased. 3. Bilateral lower lobe  atelectasis. 4. Cholelithiasis. 5. Small to moderate-sized umbilical hernia containing fat. Electronically Signed   By: Claudie Revering M.D.   On: 08/07/2018 21:14   Dg Chest Port 1 View  Result Date: 08/08/2018 CLINICAL DATA:  Status post right thoracentesis today. EXAM: PORTABLE CHEST 1 VIEW COMPARISON:  Single-view of the chest 08/07/2018. FINDINGS: Small right pleural effusion is decreased after thoracentesis. No pneumothorax. Mild right basilar atelectasis noted. Small left pleural effusion and mild basilar atelectasis are seen. Heart size is normal. Atherosclerosis noted. IMPRESSION: Decreased right pleural effusion after thoracentesis. Negative for pneumothorax. Small left pleural effusion and bibasilar atelectasis. Atherosclerosis. Electronically Signed   By: Inge Rise M.D.   On: 08/08/2018 14:49   Dg Chest Portable 1 View  Result Date: 08/07/2018 CLINICAL DATA:  Short of breath EXAM: PORTABLE CHEST 1 VIEW COMPARISON:  07/10/2018, 05/22/2018 FINDINGS: Small right-sided pleural effusion. Mild cardiomegaly with slight central congestion. Patchy interstitial and alveolar disease at the bases. Aortic atherosclerosis. No pneumothorax. IMPRESSION: 1. Mild cardiomegaly with vascular congestion and small right-sided pleural effusion. Slight increased interstitial opacity could be due to mild edema 2. Patchy airspace disease at the bases, possible superimposed atelectasis or pneumonia Electronically Signed   By: Donavan Foil M.D.   On: 08/07/2018 18:46   US Abdomen Limited Ruq  Result Date: 08/07/2018 CLINICAL DATA:  Right upper quadrant abdominal pain. EXAM: ULTRASOUND ABDOMEN LIMITED RIGHT UPPER QUADRANT COMPARISON:  None. FINDINGS: Gallbladder: Single shadowing gallstone identified measuring roughly 2.5 cm in diameter. No evidence of gallbladder wall thickening or sonographic Murphy sign. Common bile duct: Diameter: 2.4 mm Liver: No focal lesion identified. No evidence of intrahepatic biliary ductal  dilatation. Within normal limits in parenchymal echogenicity. Portal vein is patent on color Doppler imaging with normal direction of blood flow towards the liver. Other: None. IMPRESSION: Cholelithiasis with single shadowing gallstone. No evidence of acute cholecystitis or biliary obstruction. Electronically Signed   By: Aletta Edouard M.D.   On: 08/07/2018 20:14   US Thoracentesis Asp Pleural Space W/img Guide  Result Date: 08/08/2018 INDICATION: Cough and right pleural effusion. EXAM: ULTRASOUND GUIDED RIGHT THORACENTESIS MEDICATIONS: None. COMPLICATIONS: None immediate. PROCEDURE: An ultrasound guided thoracentesis was thoroughly discussed with the patient and questions answered. The benefits, risks, alternatives and complications were also discussed. The patient understands and wishes to proceed with the procedure. Written consent was obtained. Ultrasound was performed to localize and mark an adequate pocket of fluid in the right chest. The area was then prepped and draped in the normal sterile fashion. 1% Lidocaine was used for local anesthesia. Under ultrasound guidance a 6 Fr Safe-T-Centesis catheter was introduced. Thoracentesis was performed. The catheter was removed and a dressing  applied. FINDINGS: A total of approximately 575 mL of amber fluid was removed. Samples were sent to the laboratory as requested by the clinical team. IMPRESSION: Successful ultrasound guided right thoracentesis yielding 575 mL of pleural fluid. Electronically Signed   By: Markus Daft M.D.   On: 08/08/2018 14:45      Allergies as of 08/09/2018      Reactions   Codeine Other (See Comments)   Constipation      Medication List    STOP taking these medications   furosemide 20 MG tablet Commonly known as: LASIX   insulin aspart 100 UNIT/ML injection Commonly known as: novoLOG   torsemide 20 MG tablet Commonly known as: DEMADEX     TAKE these medications   albuterol 108 (90 Base) MCG/ACT inhaler Commonly  known as: VENTOLIN HFA Inhale 2 puffs into the lungs every 6 (six) hours as needed for wheezing or shortness of breath.   amLODipine 2.5 MG tablet Commonly known as: NORVASC Take 2.5 mg by mouth daily.   cephALEXin 250 MG capsule Commonly known as: KEFLEX Take 1 capsule (250 mg total) by mouth 2 (two) times daily for 3 days.   ipratropium-albuterol 0.5-2.5 (3) MG/3ML Soln Commonly known as: DUONEB Take 3 mLs by nebulization every 6 (six) hours as needed.   iron polysaccharides 150 MG capsule Commonly known as: NIFEREX Take 1 capsule (150 mg total) by mouth daily.   Levemir 100 UNIT/ML injection Generic drug: insulin detemir Inject 0.12 mLs (12 Units total) into the skin daily.   nystatin cream Commonly known as: MYCOSTATIN Apply topically 2 (two) times daily.   pantoprazole 40 MG tablet Commonly known as: Protonix Take 1 tablet (40 mg total) by mouth 2 (two) times daily.   pravastatin 10 MG tablet Commonly known as: PRAVACHOL Take 10 mg by mouth at bedtime.   tamsulosin 0.4 MG Caps capsule Commonly known as: FLOMAX Take 0.4 mg by mouth daily.         Management plans discussed with the patient and he is in agreement. Stable for discharge   Patient should follow up with hospice  CODE STATUS:     Code Status Orders  (From admission, onward)         Start     Ordered   08/07/18 2330  Do not attempt resuscitation (DNR)  Continuous    Question Answer Comment  In the event of cardiac or respiratory ARREST Do not call a "code blue"   In the event of cardiac or respiratory ARREST Do not perform Intubation, CPR, defibrillation or ACLS   In the event of cardiac or respiratory ARREST Use medication by any route, position, wound care, and other measures to relive pain and suffering. May use oxygen, suction and manual treatment of airway obstruction as needed for comfort.      08/07/18 2329        Code Status History    Date Active Date Inactive Code Status  Order ID Comments User Context   07/25/2018 0955 07/26/2018 1908 DNR [id number]  Bettey Costa, MD Inpatient   07/25/2018 0138 07/25/2018 0954 DNR [id number]  Lance Coon, MD Inpatient   07/06/2018 2054 07/10/2018 1959 DNR [id number]  Henreitta Leber, MD Inpatient   06/27/2018 1150 07/05/2018 1757 DNR [id number]  Lang Snow, NP Inpatient   06/09/2018 1350 06/14/2018 1730 DNR [id number]  Lang Snow, NP ED   05/23/2018 0919 05/28/2018 2024 DNR [id number]  Demetrios Loll, MD Inpatient   05/23/2018 0104 05/23/2018 7517  Full Code [id number]  Mayer Camel, NP Inpatient   Advance Care Planning Activity    Advance Directive Documentation     Most Recent Value  Type of Advance Directive  Healthcare Power of Attorney, Living will  Pre-existing out of facility DNR order (yellow form or pink MOST form)  -  "MOST" Form in Place?  -      TOTAL TIME TAKING CARE OF THIS PATIENT: 38 minutes.    Note: This dictation was prepared with Dragon dictation along with smaller phrase technology. Any transcriptional errors that result from this process are unintentional.  Bettey Costa M.D on 08/09/2018 at 9:46 AM  Between 7am to 6pm - Pager - [phone] After 6pm go to [email or web address] - password [email or web address] [facility]  Office  [phone]  CC: Primary care physician; Baxter Hire, MD

## 2018-08-09 NOTE — TOC Transition Note (Signed)
Transition of Care [facility]) - CM/SW Discharge Note   Patient Details  Name: Arthur [name] MRN: [id number] Date of Birth: [birth year]  Transition of [facility] CM/SW Contact:  Latanya Maudlin, RN Phone Number: 08/09/2018, 10:27 AM   Clinical Narrative:  Patient to be discharged per MD order. Orders in place for home health services. [facility] active with [facility] and wishes to resume services.Notified Corene Cornea with [facility] of discharge. Patient has all DME including chronic O2. Family to transport.     Final next level of care: Home w Home Health Services Barriers to Discharge: No Barriers Identified   Patient Goals and CMS Choice   CMS Medicare.gov Compare Post Acute Care list provided to:: Patient Choice offered to / list presented to : Patient  Discharge Placement                       Discharge Plan and Services   Discharge Planning Services: CM Consult Post Acute Care Choice: Resumption of Svcs/PTA Provider                    HH Arranged: RN, PT, OT, Nurse's Aide, Social Work [employer] Agency: [place] ([place]) Date [place]: 08/09/18 Time [place]: 1027 Representative spoke with at [place]: [place] ([place]) Interventions     Readmission Risk Interventions Readmission Risk Prevention Plan 08/09/2018 07/26/2018 07/10/2018  Transportation Screening Complete Complete -  PCP or Specialist Appt within 5-7 Days - - -  PCP or Specialist Appt within 3-5 Days - Complete Complete  Home Care Screening - - -  Medication Review (RN CM) - - -  [place] or [place] - Complete -  Social Work Consult for [place] Planning/Counseling - Complete Complete  Palliative Care Screening - Not Applicable Not Applicable  Medication Review Press photographer) Complete Complete -  PCP or Specialist appointment within 3-5 days of discharge Complete - -  [place] or Home Care Consult Complete - -  SW Recovery Care/Counseling  Consult Complete - -  Palliative Care Screening Not Applicable - -  [place] Not Applicable - -  Some recent data might be hidden

## 2018-08-09 NOTE — Progress Notes (Signed)
Daily Progress Note   Patient Name: Arthur [name]       Date: 08/09/2018 DOB: 1933/12/13  Age: 85 y.o. MRN#: [id number] Attending Physician: Bettey Costa, MD Primary Care Physician: Baxter Hire, MD Admit Date: 08/07/2018  Reason for Consultation/Follow-up: Establishing goals of care  Subjective: Patient has just finished working with PT and is sitting in bedside chair. He asks when he is leaving. He states, as he did yesterday, he does not want to return to the hospital. We discussed his previous hospitalizations and his prior life threatening diagnoses. He states his wife had hospice which he is very familiar with, and he would like to go home with hospice. He states when he becomes sick again, instead of going to the hospital he would want to stay at home and focus on comfort until he leaves this earth to go to be with his wife. He does live alone and allows me to call his daughter [name].   She states the doctor has kept her updated. Discussed his wishes with his permission. [name] is frustrated. She states every time he comes to the hospital, he can not wait to leave, and will tell the staff whatever he needs to in order to leave the hospital. She states the most recent admission, he asked to come to the hospital. She is concerned about stopping other home services as she feels they will have to be reordered if he decides against hospice once he gets home. She states the family helps him greatly at home, and his grandson has been staying with him over the summer as he is out of school. She states she is retuning to work for the school system shortly and her son is returning to school. Discussed referral for hospice at home per his wishes and a follow up [place] conversation with the two of them at home.    Length of Stay: 2  Current Medications: Scheduled Meds:  . amLODipine  2.5 mg Oral Daily  . insulin aspart  0-15 Units Subcutaneous TID WC  . insulin aspart  0-5 Units Subcutaneous QHS  . insulin detemir  12 Units Subcutaneous Daily  . pantoprazole  40 mg Oral BID  . pravastatin  10 mg Oral QHS  . tamsulosin  0.4 mg Oral Daily    Continuous Infusions: . sodium  chloride Stopped (08/08/18 2124)  . cefTRIAXone (ROCEPHIN)  IV Stopped (08/08/18 2044)    PRN Meds: sodium chloride, acetaminophen **OR** acetaminophen, ipratropium-albuterol, ondansetron **OR** ondansetron (ZOFRAN) IV, oxyCODONE  Physical Exam Pulmonary:     Effort: Pulmonary effort is normal.  Neurological:     Mental Status: He is alert.             Vital Signs: BP (!) 138/58 (BP Location: Right Arm)   Pulse 81   Temp 98.7 F (37.1 C) (Oral)   Resp 17   Ht 5\' 10"  (1.778 m)   Wt 100.6 kg   SpO2 97%   BMI 31.81 kg/m  SpO2: SpO2: 97 % O2 Device: O2 Device: Nasal Cannula O2 Flow Rate: O2 Flow Rate (L/min): 2 L/min  Intake/output summary:   Intake/Output Summary (Last 24 hours) at 08/09/2018 1008 Last data filed at 08/09/2018 0950 Gross per 24 hour  Intake 826.84 ml  Output 125 ml  Net 701.84 ml   LBM: Last BM Date: 08/07/18 Baseline Weight: Weight: 101.2 kg Most recent weight: Weight: 100.6 kg       Palliative Assessment/Data: 50%      Patient Active Problem List   Diagnosis Date Noted  . UTI (urinary tract infection) 08/07/2018  . Acute on chronic renal failure ([place]) 08/07/2018  . HLD (hyperlipidemia) 07/24/2018  . Acute on chronic respiratory failure with hypoxia ([place]) 07/06/2018  . Severe anemia 06/27/2018  . GI bleeding 06/09/2018  . Sepsis ([place]) 05/23/2018  . Cellulitis of right lower extremity 05/22/2018  . Diabetes ([place]) 10/24/2006  . OBESITY 10/24/2006  . Essential hypertension 10/24/2006  . ALLERGIC RHINITIS 10/24/2006  . COPD (chronic obstructive pulmonary disease) ([place])  10/24/2006  . TOBACCO ABUSE, HX OF 10/24/2006    Palliative Care Assessment & Plan    Recommendations/Plan:  Home with hospice.     Code Status:    Code Status Orders  (From admission, onward)         Start     Ordered   08/07/18 2330  Do not attempt resuscitation (DNR)  Continuous    Question Answer Comment  In the event of cardiac or respiratory ARREST Do not call a "code blue"   In the event of cardiac or respiratory ARREST Do not perform Intubation, CPR, defibrillation or ACLS   In the event of cardiac or respiratory ARREST Use medication by any route, position, wound care, and other measures to relive pain and suffering. May use oxygen, suction and manual treatment of airway obstruction as needed for comfort.      08/07/18 2329        Code Status History    Date Active Date Inactive Code Status Order ID Comments User Context   07/25/2018 8546 07/26/2018 1908 DNR [id number]  Bettey Costa, MD Inpatient   07/25/2018 0138 07/25/2018 0954 DNR [id number]  Lance Coon, MD Inpatient   07/06/2018 2054 07/10/2018 1959 DNR [id number]  Henreitta Leber, MD Inpatient   06/27/2018 1150 07/05/2018 1757 DNR [id number]  Lang Snow, NP Inpatient   06/09/2018 1350 06/14/2018 1730 DNR [id number]  Lang Snow, NP ED   05/23/2018 0919 05/28/2018 2024 DNR [id number]  Demetrios Loll, MD Inpatient   05/23/2018 0104 05/23/2018 0918 Full Code [id number]  Mayer Camel, NP Inpatient   Advance Care Planning Activity    Advance Directive Documentation     Most Recent Value  Type of Advance Directive  Healthcare Power of Attorney, Living will  Pre-existing out of facility DNR  order (yellow form or pink MOST form)  -  "MOST" Form in Place?  -       Prognosis:   < 6 months  Current admission with pleural effusion s/p thoracentesis. Multiple admissions over the past few months for:  GIB with acute on chronic anemia with a HGB of 6.6, PNA with pleural effusion s/p thoracentesis, lower  extremity cellulitis.    Discharge Planning:  Home with Hospice  Care plan was discussed with CM and primary MD.   Thank you for allowing the Palliative Medicine Team to assist in the care of this patient.   Time In: 9:00 Time Out: 10:35 Total Time 95 min Prolonged Time Billed  yes       Greater than 50%  of this time was spent counseling and coordinating care related to the above assessment and plan.  Asencion Gowda, NP  Please contact Palliative Medicine Team phone at [phone] for questions and concerns.

## 2018-08-09 NOTE — Progress Notes (Signed)
Patient discharged home with home health per MD orders. Discharge instructions given to patient and daughter [name] and all questions answered.

## 2018-08-09 NOTE — Evaluation (Signed)
Physical Therapy Evaluation Patient Details Name: Arthur [name] MRN: [id number] DOB: 10-05-1933 Today's Date: 08/09/2018   History of Present Illness  85 year old male recently here with GI bleed. Frequent re-admissions within the past few months.  Now here with UTI, had 575 mL pulled on thoracentisis.  PMH includes COPD, HLD, DM, HTN, BPH, and CKD III. Patient wears 2L 02 chronically.  Clinical Impression  Pt eager to get out of the hospital and therefore willing to work with PT and show that he is able.  He was able to circumambulate the nurses' station with walker (he is reliant on it) but no LOBs or overt safety issues.  He was on 2L O2 (which is his baseline) t/o the effort and sats were in the mid 90s at rest and dropped to high 80s during prolonged bout of ambulation, pt does endorse some fatigue.  Pt does not need want further PT f/u, and this PT agrees that it is not needed, pt likely to go home with hospice services.    Follow Up Recommendations No PT follow up    Equipment Recommendations  None recommended by PT    Recommendations for Other Services       Precautions / Restrictions Precautions Precautions: Fall Restrictions Weight Bearing Restrictions: No      Mobility  Bed Mobility               General bed mobility comments: Pt is sitting in recliner on arrival, not tested  Transfers Overall transfer level: Modified independent Equipment used: Rolling walker (2 wheeled) Transfers: Sit to/from Stand Sit to Stand: Min guard         General transfer comment: Pt able to rise to standing w/o assist or issue.  Pt reliant on walker.  Ambulation/Gait Ambulation/Gait assistance: Supervision Gait Distance (Feet): 200 Feet Assistive device: Rolling walker (2 wheeled)       General Gait Details: Pt did relatively well with ambulation using FWW.  He was reliant on the walker but this is his baseline, he did have slight drop in O2 on 2L (mid 90s initially, down to  ~90% with ambulation)   Stairs            Wheelchair Mobility    Modified Rankin (Stroke Patients Only)       Balance Overall balance assessment: Modified Independent                                           Pertinent Vitals/Pain Pain Assessment: No/denies pain    Home Living Family/patient expects to be discharged to:: Private residence Living Arrangements: Alone Available Help at Discharge: Family;Personal care attendant(daughter-in-law drives/runs errands for him) Type of Home: House Home Access: (previous notes say he has 8 steps, today he reports none?)     Home Layout: One level Home Equipment: Cane - single point;Walker - 2 wheels;Walker - 4 wheels;Bedside commode      Prior Function Level of Independence: Independent with assistive device(s)   Gait / Transfers Assistance Needed: Patient ambulates with RW short distances.  ADL's / Homemaking Assistance Needed: Patient reports he has home nursing and PT. Daughter makes meals for him.        Hand Dominance        Extremity/Trunk Assessment   Upper Extremity Assessment Upper Extremity Assessment: Generalized weakness;Overall [facility] for tasks assessed    Lower Extremity  Assessment Lower Extremity Assessment: Overall WFL for tasks assessed;Generalized weakness       Communication   Communication: No difficulties  Cognition Arousal/Alertness: Awake/alert Behavior During Therapy: WFL for tasks assessed/performed Overall Cognitive Status: Within Functional Limits for tasks assessed                                        General Comments      Exercises     Assessment/Plan    PT Assessment Patient needs continued PT services  PT Problem List         PT Treatment Interventions Gait training;Stair training;Functional mobility training;Therapeutic activities;Therapeutic exercise;Balance training;Cognitive remediation;Patient/family education;DME  instruction;Neuromuscular re-education;Manual techniques    PT Goals (Current goals can be found in the Care Plan section)  Acute Rehab PT Goals Patient Stated Goal: go home ASAP PT Goal Formulation: All assessment and education complete, DC therapy    Frequency Min 2X/week   Barriers to discharge        Co-evaluation               AM-PAC PT "6 Clicks" Mobility  Outcome Measure Help needed turning from your back to your side while in a flat bed without using bedrails?: A Little Help needed moving from lying on your back to sitting on the side of a flat bed without using bedrails?: A Lot Help needed moving to and from a bed to a chair (including a wheelchair)?: A Little Help needed standing up from a chair using your arms (e.g., wheelchair or bedside chair)?: A Little Help needed to walk in hospital room?: A Little Help needed climbing 3-5 steps with a railing? : A Lot 6 Click Score: 16    End of Session Equipment Utilized During Treatment: Gait belt;Oxygen Activity Tolerance: Patient tolerated treatment well Patient left: with chair alarm set;with call bell/phone within reach Nurse Communication: Mobility status PT Visit Diagnosis: Difficulty in walking, not elsewhere classified (R26.2);Muscle weakness (generalized) (M62.81)    Time: 8592-9244 PT Time Calculation (min) (ACUTE ONLY): 15 min   Charges:   PT Evaluation $PT Eval Low Complexity: 1 Low          Kreg Shropshire, DPT 08/09/2018, 10:19 AM

## 2018-08-10 DIAGNOSIS — I130 Hypertensive heart and chronic kidney disease with heart failure and stage 1 through stage 4 chronic kidney disease, or unspecified chronic kidney disease: Secondary | ICD-10-CM | POA: Diagnosis not present

## 2018-08-10 DIAGNOSIS — J439 Emphysema, unspecified: Secondary | ICD-10-CM | POA: Diagnosis not present

## 2018-08-10 DIAGNOSIS — D62 Acute posthemorrhagic anemia: Secondary | ICD-10-CM | POA: Diagnosis not present

## 2018-08-10 DIAGNOSIS — D500 Iron deficiency anemia secondary to blood loss (chronic): Secondary | ICD-10-CM | POA: Diagnosis not present

## 2018-08-10 DIAGNOSIS — E1122 Type 2 diabetes mellitus with diabetic chronic kidney disease: Secondary | ICD-10-CM | POA: Diagnosis not present

## 2018-08-10 DIAGNOSIS — J9622 Acute and chronic respiratory failure with hypercapnia: Secondary | ICD-10-CM | POA: Diagnosis not present

## 2018-08-10 DIAGNOSIS — K922 Gastrointestinal hemorrhage, unspecified: Secondary | ICD-10-CM | POA: Diagnosis not present

## 2018-08-10 DIAGNOSIS — I5033 Acute on chronic diastolic (congestive) heart failure: Secondary | ICD-10-CM | POA: Diagnosis not present

## 2018-08-10 DIAGNOSIS — N183 Chronic kidney disease, stage 3 (moderate): Secondary | ICD-10-CM | POA: Diagnosis not present

## 2018-08-11 LAB — URINE CULTURE: Culture: >=100000 — AB

## 2018-08-12 ENCOUNTER — Encounter: Payer: Self-pay | Admitting: *Deleted

## 2018-08-12 ENCOUNTER — Other Ambulatory Visit: Payer: Self-pay | Admitting: *Deleted

## 2018-08-12 DIAGNOSIS — D649 Anemia, unspecified: Secondary | ICD-10-CM | POA: Diagnosis not present

## 2018-08-12 DIAGNOSIS — D509 Iron deficiency anemia, unspecified: Secondary | ICD-10-CM | POA: Diagnosis not present

## 2018-08-12 DIAGNOSIS — I878 Other specified disorders of veins: Secondary | ICD-10-CM | POA: Diagnosis not present

## 2018-08-12 DIAGNOSIS — D62 Acute posthemorrhagic anemia: Secondary | ICD-10-CM | POA: Diagnosis not present

## 2018-08-12 DIAGNOSIS — N183 Chronic kidney disease, stage 3 (moderate): Secondary | ICD-10-CM | POA: Diagnosis not present

## 2018-08-12 DIAGNOSIS — E1142 Type 2 diabetes mellitus with diabetic polyneuropathy: Secondary | ICD-10-CM | POA: Diagnosis not present

## 2018-08-12 DIAGNOSIS — J9622 Acute and chronic respiratory failure with hypercapnia: Secondary | ICD-10-CM | POA: Diagnosis not present

## 2018-08-12 DIAGNOSIS — I5033 Acute on chronic diastolic (congestive) heart failure: Secondary | ICD-10-CM | POA: Diagnosis not present

## 2018-08-12 DIAGNOSIS — D500 Iron deficiency anemia secondary to blood loss (chronic): Secondary | ICD-10-CM | POA: Diagnosis not present

## 2018-08-12 DIAGNOSIS — I130 Hypertensive heart and chronic kidney disease with heart failure and stage 1 through stage 4 chronic kidney disease, or unspecified chronic kidney disease: Secondary | ICD-10-CM | POA: Diagnosis not present

## 2018-08-12 DIAGNOSIS — J439 Emphysema, unspecified: Secondary | ICD-10-CM | POA: Diagnosis not present

## 2018-08-12 DIAGNOSIS — B351 Tinea unguium: Secondary | ICD-10-CM | POA: Diagnosis not present

## 2018-08-12 DIAGNOSIS — K922 Gastrointestinal hemorrhage, unspecified: Secondary | ICD-10-CM | POA: Diagnosis not present

## 2018-08-12 DIAGNOSIS — E1122 Type 2 diabetes mellitus with diabetic chronic kidney disease: Secondary | ICD-10-CM | POA: Diagnosis not present

## 2018-08-12 DIAGNOSIS — K5900 Constipation, unspecified: Secondary | ICD-10-CM | POA: Diagnosis not present

## 2018-08-12 DIAGNOSIS — L309 Dermatitis, unspecified: Secondary | ICD-10-CM | POA: Diagnosis not present

## 2018-08-12 LAB — BODY FLUID CULTURE: Culture: NO GROWTH

## 2018-08-12 LAB — CYTOLOGY - NON PAP

## 2018-08-12 NOTE — Patient Outreach (Signed)
[facility]) [place] Telephone Outreach PCP office completes Transition of Care follow up post-hospital discharge Post- hospital discharge day # 3  08/12/2018  Arthur [name] 05-04-1933 [id number]  Successful outgoing telephone call to Arthur [name], daughter/ primary caregiver on [facility], for Arthur [name], 85 y/o malereferred to [place] byinpatient RN CM during recent hospital visit July 4-8, 2020 for acute on chronic respiratory failure with hypoxia secondary to acute on chronic CHF; patient was noted to have (R) pleural effusion and had thoracentesis during hospital visit with 1.2 liters of fluid removed. Patient was discharged home to self-care with ongoing home health services through [place] care.Patient has history including, but not limited to, DM; HTN/ HLD; obesity; COPD, on home O2; anemia of unknown origin; chronic respiratory failure; CKD- III.  Unfortunately patient experienced hospital re-admission 14 days after previous hospital discharge, from July 22-24, 2020 for anemia and GI bleeding. Patient was given one unit of PRBC's while hospitalized and was again discharged home to self-care with established home health services in place.  Patient then experienced another hospital admission 11 days after hospital discharge August 5-7, 2020 with (R) UQ pain, acute renal failure, UTI. During this admission, patient was noted to have recurrent (R) pleural effusion and underwent thoracentesis with drainage of 600 cc fluid-- this is hospital admission # 6 since May 2020, in patient that had previously never been hospitalized.   HIPAA/ identity verified with caregiver.  Pleasant 45 minute phone call.  Caregiver reports that patient "is better" and is currently asleep, napping after the activity of attending 2 provider office visits earlier today.  Caregiver denies that patient is in pain or had new/ recent falls.  Reports patient continues  using walker for all ambulation.  Caregiver denies current clinical concerns around patient's post-hospital discharge condition.  Reviewed with caregiver post-hospital discharge instructions and hospital course; caregiver verbalizes frustration that hospital team wanted to discharge patient with hospice; stated that "has lost trust" in hospital team, as they can not find concrete reasons for his newly diagnosed ongoing anemia/ recurrent pleural effusions.  She further reports that changes to patient's regular medications are made at hospital discharge, but no one at hospital can tell her why the changes were made (meal time insulin discontinued/ diuretics discontinued-- neither of which caregiver is in agreement with).  Caregiver reports that she is not going to stop his insulin and diuretics as advised, as she "knows that will cause him to run into trouble," and "another hospital visit.  Updated patient's medication list per report of caregiver, and encouraged her to promptly discuss her concerns with patient's PCP/ care providers for ongoing clarification.  Caregiver further reports:  -- attended new GI provider and established podiatry office visits earlier today: stated both visits went well, although states that new GI provider is "just as perplexed" as "all his other doctors" around cause for newly diagnosed anemia- states lab work completed and "blood counts are better but still running low."  GI provider instructed patient to continue iron supplement and dietary iron sources, with hematology follow up as scheduled.  Reports podiatry provider "was very glad" that patient's usual lower extremity swelling 'was so much better."  -- plans to attend tomorrow's scheduled hematology provider office visit: caregiver was coached around talking points/ questions to ask during this appointment; using teach back method, reiterated previously provided education around signs/ symptoms anemia, dietary choices in  setting of anemia, action plan for development of  signs/ symptoms- caregiver continues to report that patient does not have signs/ symptoms anemia and that his low blood counts are "his only symptom"  -- Plans to attend upcoming post- hospital discharge PCP appointment on Monday 08/19/2018-- discussed with caregiver talking points for this visit around the hospice referral made at the hospital.  We extensively discussed options around community palliative care/ hospice, and difference between these services and home health services.  I encouraged caregiver to have frank discussion with patient around his wishes/ goals of care prior to attending PCP appointment, as caregiver feels that patient "would never have agreed with hospice" at the hospital, although the palliative consult note indicated otherwise.  Caregiver states patient "will tell them whatever they want to hear if that means they let him go home"  -- home health services continueand she confirms that she communicates regularly with home health team   Self-health management of chronic disease states of CHF, DM, and newly diagnosed anemia/ recurrent pleural effusion:  -- continues monitoring and recording daily weights at home: again states that patient's breathing/fluid status "seem better than they have in a long time," again adding that the podiatrist was "very impressed" with patient's lack of leg swelling from his normal/ baseline; continues using home O2 at 2 L/min "all the time," and SaO2 levels at home are consistently running between 96-98%.  Continues able to verbalize weight gain guidelines in setting of CHF, along with corresponding action plan. -- reports daily weight ranges at home over last week 223-225 lbs, which is improved from last ranges reported (238-240 lbs) -- continues to report no further issues around hypoglycemia;  Continues holding meal time insulin for blood sugars < 100 -- discussed need for balance in fluid intake  in setting of CHF and CKD- to prevent fluid overload and avoid dehydration- caregiver reports they have routinely restricted patient's fluid intake as advised- discussed signs/ symptoms dehydration, which can lead to UTI  Caregiverdenies further issues, concerns, or problems today. Iconfirmed thatshehasmy direct phone number, the main [facility] CM office phone number, and the [facility] CM 24-hour nurse advice phone number should issues arise prior to next scheduled [place] outreachpost-upcoming scheduled provider office visits. Encouragedcaregiverto contact me directly if needs, questions, issues, or concerns arise prior to next scheduled outreach;sheagreed to do so.   Plan:  Patient will take medications as prescribed and will attend all scheduled provider appointments  Patient will promptly notify care providers for any new concerns/ issues/ problems that arise  Patient will actively participate in home health services as ordered post-hospital discharge  Patient will continue monitoring/recording daily blood sugars 4 times per day  Patient willcontinuedaily weight monitoring and recording at home  I will place printed educational material in mail to patient/ caregiver around palliative care/ pleural effusion/ medical care during advanced illness and patient/ caregiver will review for further discussion  [facility] CM outreach to continue with scheduled phone call to caregiver next week, post- upcoming scheduled provider office visits  [facility] CM Care Plan Problem One     Most Recent Value  Care Plan Problem One  High Risk for hospital readmission, related to/ as evidenced by multiple recent hospitalizations for multiple chronic conditions  Role Documenting the Problem One  Care Management Coordinator  Care Plan for Problem One  Active  [facility] Long Term Goal   Over the next 31 days, patient will not experience hospital readmission, as evidenced by patient/ caregiver reporting  and review of EMR during [facility] RN CM  outreach  [facility] Long Term Goal Start Date  08/12/18 [Goal re-established/ extended due to re-admission]  Interventions for Problem One Long Term Goal  Discussed current clinical condition with caregiver and reviewed hospital discharge instructions,  confirmed that patient completed prescribed dose of discharge antibiotics and that caregiver has no current clinical concerns around patient's condition post-most recent hosptal discharge  [facility] CM Short Term Goal #1   Over the next 30 days, patient will attend all scheduled provider appointments as evidenced by patient/ caregiver reporting and review of EMR during [place] RN CM outreach  [facility] CM Short Term Goal #1 Start Date  07/12/18  [facility] CM Short Term Goal #1 Met Date  08/12/18 [Goal Met]  Interventions for Short Term Goal #1  Confirmed that caregiver continues to provide transportation to and attend with patient all scheduled provider office visits,  reviewed upcoming scheduled appointments with caregiver and confirmed that patient attended new GI provider office visit this morning,  confirmed that caregiver has accurate understanding of upcoming appointments and coached caregiver on talking points to cover with upcoming hematology and PCP providers  [facility] CM Short Term Goal #2   Over the next 30 days, patient will actively participate in home health services as ordered post-hospital discharge, as evidenced by patient/ caregiver reporting and collaboration with home health team as indicated during [place] RN CM outreach  [facility] CM Short Term Goal #2 Start Date  07/12/18  [facility] CM Short Term Goal #2 Met Date  08/12/18 [Goal met]  Interventions for Short Term Goal #2  Confirmed with caregiver that home health sevrices remain active and that patient contiues participating in all disciplines,  confirmed that caregiver maintains regular contact with home health team and has contact information,  discussed with caregiver  difference between community palliative care and home health services    [facility] CM Care Plan Problem Two     Most Recent Value  Care Plan Problem Two  Self-health management of chronic disease state of CHF, as evidenced by recent hospitalization for CHF exacerbation and caregiver reporting   Role Documenting the Problem Two  Care Management Coordinator  Care Plan for Problem Two  Active  Interventions for Problem Two Long Term Goal   Confirmed with caregiver that patient continues monitoring and recording daily weights at home and reviewed all recent weights with caregiver,  using teach back method, reiterated with caregiver signs/ symptoms CHF yellow zone with corresponding action plan,  confirmed that caregiver continues managing patient's medications  [facility] Long Term Goal  Over the next 31 days, patient will monitor and record daily weights, as evidenced by review of same/ caregiver reporting during [place] outreach  [facility] Long Term Goal Start Date  07/19/18    [facility] CM Care Plan Problem Three     Most Recent Value  Care Plan Problem Three  Need for ongoing follow up of newly diagnosed anemia/ GI bleeding related to/ as evidenced by multiple recent hospitalizations for anemia  Role Documenting the Problem Three  Care Management Coordinator  Care Plan for Problem Three  Active  [facility] Long Term Goal   Over the next 45 days, patient/ caregiver will verbalize ongoing plan for management of patient's newly diagnosed anemia, as evidenced by caregiver reporting during [place] RN CM outreach   [facility] Long Term Goal Start Date  07/29/18  Interventions for Problem Three Long Term Goal  Discussed with caregiver talking points to cover with tomorrow's scheduled hematology provider,  initiated with  caregiver discussion around palliative care vs. hospice options and encouraged caregiver to discuss with PCP at upcoming scheduled office visit,  placed printed EMMI educational material in mail to patient/  caregiver around palliative care, pleural effusion, and medical care during advanced illness.  Reviewed with caregiver previously provided education mailed around anemia and GI bleeding     Oneta Rack, RN, BSN, [place] Coordinator [facility] Care Management  [phone]

## 2018-08-13 ENCOUNTER — Inpatient Hospital Stay: Payer: Medicare HMO

## 2018-08-13 ENCOUNTER — Inpatient Hospital Stay: Payer: Medicare HMO | Attending: Oncology | Admitting: Oncology

## 2018-08-13 ENCOUNTER — Encounter: Payer: Self-pay | Admitting: Oncology

## 2018-08-13 ENCOUNTER — Other Ambulatory Visit: Payer: Self-pay

## 2018-08-13 VITALS — BP 142/62 | HR 84 | Temp 98.2°F | Resp 18 | Wt 227.0 lb

## 2018-08-13 DIAGNOSIS — I509 Heart failure, unspecified: Secondary | ICD-10-CM | POA: Insufficient documentation

## 2018-08-13 DIAGNOSIS — J439 Emphysema, unspecified: Secondary | ICD-10-CM | POA: Insufficient documentation

## 2018-08-13 DIAGNOSIS — I130 Hypertensive heart and chronic kidney disease with heart failure and stage 1 through stage 4 chronic kidney disease, or unspecified chronic kidney disease: Secondary | ICD-10-CM | POA: Diagnosis not present

## 2018-08-13 DIAGNOSIS — K4090 Unilateral inguinal hernia, without obstruction or gangrene, not specified as recurrent: Secondary | ICD-10-CM | POA: Insufficient documentation

## 2018-08-13 DIAGNOSIS — Z794 Long term (current) use of insulin: Secondary | ICD-10-CM | POA: Diagnosis not present

## 2018-08-13 DIAGNOSIS — N400 Enlarged prostate without lower urinary tract symptoms: Secondary | ICD-10-CM | POA: Diagnosis not present

## 2018-08-13 DIAGNOSIS — D62 Acute posthemorrhagic anemia: Secondary | ICD-10-CM | POA: Diagnosis not present

## 2018-08-13 DIAGNOSIS — D509 Iron deficiency anemia, unspecified: Secondary | ICD-10-CM | POA: Diagnosis not present

## 2018-08-13 DIAGNOSIS — K429 Umbilical hernia without obstruction or gangrene: Secondary | ICD-10-CM | POA: Diagnosis not present

## 2018-08-13 DIAGNOSIS — K8020 Calculus of gallbladder without cholecystitis without obstruction: Secondary | ICD-10-CM | POA: Insufficient documentation

## 2018-08-13 DIAGNOSIS — E785 Hyperlipidemia, unspecified: Secondary | ICD-10-CM | POA: Insufficient documentation

## 2018-08-13 DIAGNOSIS — K922 Gastrointestinal hemorrhage, unspecified: Secondary | ICD-10-CM | POA: Diagnosis not present

## 2018-08-13 DIAGNOSIS — J9610 Chronic respiratory failure, unspecified whether with hypoxia or hypercapnia: Secondary | ICD-10-CM | POA: Insufficient documentation

## 2018-08-13 DIAGNOSIS — R531 Weakness: Secondary | ICD-10-CM | POA: Diagnosis not present

## 2018-08-13 DIAGNOSIS — E1122 Type 2 diabetes mellitus with diabetic chronic kidney disease: Secondary | ICD-10-CM | POA: Diagnosis not present

## 2018-08-13 DIAGNOSIS — D631 Anemia in chronic kidney disease: Secondary | ICD-10-CM | POA: Insufficient documentation

## 2018-08-13 DIAGNOSIS — N390 Urinary tract infection, site not specified: Secondary | ICD-10-CM | POA: Insufficient documentation

## 2018-08-13 DIAGNOSIS — Z87891 Personal history of nicotine dependence: Secondary | ICD-10-CM | POA: Insufficient documentation

## 2018-08-13 DIAGNOSIS — D649 Anemia, unspecified: Secondary | ICD-10-CM

## 2018-08-13 DIAGNOSIS — Z79899 Other long term (current) drug therapy: Secondary | ICD-10-CM | POA: Diagnosis not present

## 2018-08-13 DIAGNOSIS — J90 Pleural effusion, not elsewhere classified: Secondary | ICD-10-CM | POA: Insufficient documentation

## 2018-08-13 DIAGNOSIS — D500 Iron deficiency anemia secondary to blood loss (chronic): Secondary | ICD-10-CM | POA: Diagnosis not present

## 2018-08-13 DIAGNOSIS — I129 Hypertensive chronic kidney disease with stage 1 through stage 4 chronic kidney disease, or unspecified chronic kidney disease: Secondary | ICD-10-CM | POA: Diagnosis not present

## 2018-08-13 DIAGNOSIS — N184 Chronic kidney disease, stage 4 (severe): Secondary | ICD-10-CM | POA: Diagnosis not present

## 2018-08-13 DIAGNOSIS — I5033 Acute on chronic diastolic (congestive) heart failure: Secondary | ICD-10-CM | POA: Diagnosis not present

## 2018-08-13 DIAGNOSIS — J9622 Acute and chronic respiratory failure with hypercapnia: Secondary | ICD-10-CM | POA: Diagnosis not present

## 2018-08-13 DIAGNOSIS — N183 Chronic kidney disease, stage 3 (moderate): Secondary | ICD-10-CM | POA: Diagnosis not present

## 2018-08-13 LAB — CBC WITH DIFFERENTIAL/PLATELET
Abs Immature Granulocytes: 0.09 10*3/uL — ABNORMAL HIGH (ref 0.00–0.07)
Basophils Absolute: 0.1 10*3/uL (ref 0.0–0.1)
Basophils Relative: 1 %
Eosinophils Absolute: 0.4 10*3/uL (ref 0.0–0.5)
Eosinophils Relative: 3 %
HCT: 26.6 % — ABNORMAL LOW (ref 39.0–52.0)
Hemoglobin: 8.4 g/dL — ABNORMAL LOW (ref 13.0–17.0)
Immature Granulocytes: 1 %
Lymphocytes Relative: 17 %
Lymphs Abs: 1.8 10*3/uL (ref 0.7–4.0)
MCH: 28.8 pg (ref 26.0–34.0)
MCHC: 31.6 g/dL (ref 30.0–36.0)
MCV: 91.1 fL (ref 80.0–100.0)
Monocytes Absolute: 0.9 10*3/uL (ref 0.1–1.0)
Monocytes Relative: 9 %
Neutro Abs: 7.2 10*3/uL (ref 1.7–7.7)
Neutrophils Relative %: 69 %
Platelets: 300 10*3/uL (ref 150–400)
RBC: 2.92 MIL/uL — ABNORMAL LOW (ref 4.22–5.81)
RDW: 17.6 % — ABNORMAL HIGH (ref 11.5–15.5)
WBC: 10.5 10*3/uL (ref 4.0–10.5)
nRBC: 0.2 % (ref 0.0–0.2)

## 2018-08-13 LAB — TSH: TSH: 1.951 u[IU]/mL (ref 0.350–4.500)

## 2018-08-13 LAB — TECHNOLOGIST SMEAR REVIEW

## 2018-08-13 NOTE — Progress Notes (Signed)
Hematology/Oncology  [place] Telephone:([phone] [phone]   Patient Care Team: Baxter Hire, MD as PCP - General (Internal Medicine) Knox Royalty, RN as [facility]  REFERRING PROVIDER: Baxter Hire, MD  CHIEF COMPLAINTS/REASON FOR VISIT:  Evaluation of anemia  HISTORY OF PRESENTING ILLNESS:   Arthur [name] is a  85 y.o.  male with PMH listed below was seen in consultation at the request of  Baxter Hire, MD  for evaluation of anemia Patient has chronic comorbidities including diabetes, COPD/emphysema, chronic respiratory failure on home oxygen, CKD stage III, CHF, hypertension and hyperlipidemia. Reviewed patient's previous lab records. Extensive medical records review was performed by me. Anemia has been chronic, dated back to at least 2016.  At that time his hemoglobin was around 11-12. Hemoglobin started to trended down below 10 starting February 2020. 02/11/2018 hemoglobin 9, hematocrit 28.6, 06/17/2018 hemoglobin 7.3 hematocrit of 24, 06/27/2018 hemoglobin 5.8 07/15/2018 hemoglobin 7.5 07/29/2018 hemoglobin 8.2, 08/12/2018, hemoglobin 9.2  Patient reports feeling tired and weak.  Chronic shortness of breath at baseline today.  Daughter [name] was called during the clinical encounter and she was able to listen to the discussion and participate as well.  Review of Systems  Constitutional: Positive for appetite change and fatigue. Negative for chills, fever and unexpected weight change.  HENT:   Negative for hearing loss and voice change.   Eyes: Negative for eye problems and icterus.  Respiratory: Positive for shortness of breath. Negative for chest tightness and cough.   Cardiovascular: Negative for chest pain and leg swelling.  Gastrointestinal: Negative for abdominal distention and abdominal pain.  Endocrine: Negative for hot flashes.  Genitourinary: Negative for difficulty urinating and  dysuria.   Musculoskeletal: Negative for arthralgias.  Skin: Negative for itching and rash.  Neurological: Negative for light-headedness and numbness.  Hematological: Negative for adenopathy. Does not bruise/bleed easily.  Psychiatric/Behavioral: Negative for confusion.    MEDICAL HISTORY:  Past Medical History:  Diagnosis Date   CHF (congestive heart failure) (HCC)    CKD (chronic kidney disease), stage III (HCC)    COPD (chronic obstructive pulmonary disease) ([place])    Diabetes mellitus without complication ([place])    Hyperlipidemia    Hypertension     SURGICAL HISTORY: Past Surgical History:  Procedure Laterality Date   COLONOSCOPY WITH PROPOFOL N/A 06/11/2018   Procedure: COLONOSCOPY WITH PROPOFOL;  Surgeon: Jonathon Bellows, MD;  Location: [facility] ENDOSCOPY;  Service: Gastroenterology;  Laterality: N/A;   COLONOSCOPY WITH PROPOFOL N/A 06/13/2018   Procedure: COLONOSCOPY WITH PROPOFOL;  Surgeon: Jonathon Bellows, MD;  Location: [facility] ENDOSCOPY;  Service: Gastroenterology;  Laterality: N/A;   ESOPHAGOGASTRODUODENOSCOPY Left 06/10/2018   Procedure: ESOPHAGOGASTRODUODENOSCOPY (EGD);  Surgeon: Jonathon Bellows, MD;  Location: [facility] ENDOSCOPY;  Service: Gastroenterology;  Laterality: Left;   GIVENS CAPSULE STUDY N/A 06/28/2018   Procedure: GIVENS CAPSULE STUDY;  Surgeon: Lucilla Lame, MD;  Location: [facility] ENDOSCOPY;  Service: Endoscopy;  Laterality: N/A;   GIVENS CAPSULE STUDY N/A 06/30/2018   Procedure: GIVENS CAPSULE STUDY;  Surgeon: Jonathon Bellows, MD;  Location: [facility] ENDOSCOPY;  Service: Gastroenterology;  Laterality: N/A;    SOCIAL HISTORY: Social History   Socioeconomic History   Marital status: Widowed    Spouse name: Not on file   Number of children: Not on file   Years of education: Not on file   Highest education level: Not on file  Occupational History   Not on file  Social Needs   Financial resource  strain: Not hard at all   Food insecurity    Worry: Never true     Inability: Never true   Transportation needs    Medical: No    Non-medical: No  Tobacco Use   Smoking status: Former Smoker    Packs/day: 2.00    Years: 35.00    Pack years: 70.00    Types: Cigarettes    Quit date: 11/04/1982    Years since quitting: 35.7   Smokeless tobacco: Current User  Substance and Sexual Activity   Alcohol use: Not Currently   Drug use: No   Sexual activity: Not Currently  Lifestyle   Physical activity    Days per week: 0 days    Minutes per session: Not on file   Stress: Not at all  Relationships   Social connections    Talks on phone: Not on file    Gets together: Not on file    Attends religious service: Not on file    Active member of club or organization: Not on file    Attends meetings of clubs or organizations: Not on file    Relationship status: Not on file   Intimate partner violence    Fear of current or ex partner: Patient refused    Emotionally abused: Patient refused    Physically abused: Patient refused    Forced sexual activity: Patient refused  Other Topics Concern   Not on file  Social History Narrative   Not on file    FAMILY HISTORY: Family History  Problem Relation Age of Onset   COPD Mother    Cancer Father     ALLERGIES:  is allergic to codeine.  MEDICATIONS:  Current Outpatient Medications  Medication Sig Dispense Refill   albuterol (VENTOLIN HFA) 108 (90 Base) MCG/ACT inhaler Inhale 2 puffs into the lungs every 6 (six) hours as needed for wheezing or shortness of breath. 1 Inhaler 2   amLODipine (NORVASC) 2.5 MG tablet Take 2.5 mg by mouth daily.     ipratropium-albuterol (DUONEB) 0.5-2.5 (3) MG/3ML SOLN Take 3 mLs by nebulization every 6 (six) hours as needed. 360 mL 0   iron polysaccharides (NIFEREX) 150 MG capsule Take 1 capsule (150 mg total) by mouth daily. 30 capsule 0   LEVEMIR 100 UNIT/ML injection Inject 0.12 mLs (12 Units total) into the skin daily. 10 mL 0   nystatin cream  (MYCOSTATIN) Apply topically 2 (two) times daily. 30 g 0   pantoprazole (PROTONIX) 40 MG tablet Take 1 tablet (40 mg total) by mouth 2 (two) times daily. 60 tablet 0   pravastatin (PRAVACHOL) 10 MG tablet Take 10 mg by mouth at bedtime.     tamsulosin (FLOMAX) 0.4 MG CAPS capsule Take 0.4 mg by mouth daily.     torsemide (DEMADEX) 20 MG tablet Take 20 mg by mouth daily.     No current facility-administered medications for this visit.      PHYSICAL EXAMINATION: ECOG PERFORMANCE STATUS: 2 - Symptomatic, <50% confined to bed Vitals:   08/13/18 0940  BP: (!) 142/62  Pulse: 84  Resp: 18  Temp: 98.2 F (36.8 C)   Filed Weights   08/13/18 0940  Weight: 227 lb (103 kg)    Physical Exam Constitutional:      General: He is not in acute distress.    Appearance: He is ill-appearing.  HENT:     Head: Normocephalic and atraumatic.  Eyes:     General: No scleral icterus.    Pupils:  Pupils are equal, round, and reactive to light.  Neck:     Musculoskeletal: Normal range of motion and neck supple.  Cardiovascular:     Rate and Rhythm: Normal rate.     Heart sounds: Normal heart sounds.  Pulmonary:     Effort: Pulmonary effort is normal. No respiratory distress.     Breath sounds: No wheezing.  Abdominal:     General: Bowel sounds are normal. There is no distension.     Palpations: Abdomen is soft. There is no mass.     Tenderness: There is no abdominal tenderness.  Musculoskeletal: Normal range of motion.        General: No deformity.  Skin:    General: Skin is warm and dry.  Neurological:     Mental Status: He is alert and oriented to person, place, and time.     Cranial Nerves: No cranial nerve deficit.  Psychiatric:        Mood and Affect: Mood normal.     LABORATORY DATA:  I have reviewed the data as listed Lab Results  Component Value Date   WBC 10.5 08/13/2018   HGB 8.4 (L) 08/13/2018   HCT 26.6 (L) 08/13/2018   MCV 91.1 08/13/2018   PLT 300 08/13/2018    Recent Labs    07/06/18 1613  07/15/18 1544  08/07/18 1818 08/08/18 0542 08/09/18 0510  NA 139   < > 140   < > 133* 134* 135  K 3.5   < > 3.7   < > 3.3* 3.7 3.2*  CL 95*   < > 95*   < > 89* 90* 92*  CO2 36*   < > 33*   < > 32 30 32  GLUCOSE 131*   < > 137*   < > 199* 190* 110*  BUN 31*   < > 30*   < > 45* 41* 47*  CREATININE 1.96*   < > 2.03*   < > 2.48* 2.25* 2.64*  CALCIUM 8.0*   < > 7.6*   < > 8.7* 8.6* 8.4*  GFRNONAA 31*   < > 29*   < > 23* 26* 21*  GFRAA 36*   < > 34*   < > 27* 30* 25*  PROT 6.0*  --  5.7*  --  8.0  --   --   ALBUMIN 3.0*  --  3.0*  --  3.8  --   --   AST 14*  --  20  --  14*  --   --   ALT 12  --  21  --  12  --   --   ALKPHOS 49  --  57  --  65  --   --   BILITOT 0.8  --  0.3  --  0.9  --   --    < > = values in this interval not displayed.   Iron/TIBC/Ferritin/ %Sat    Component Value Date/Time   IRON 36 (L) 07/15/2018 1544   TIBC 172 (L) 07/15/2018 1544   FERRITIN 535 (H) 07/15/2018 1544   IRONPCTSAT 21 07/15/2018 1544      RADIOGRAPHIC STUDIES: I have personally reviewed the radiological images as listed and agreed with the findings in the report.  Ct Abdomen Pelvis Wo Contrast  Result Date: 08/07/2018 CLINICAL DATA:  Abdominal pain, distention and vomiting. EXAM: CT ABDOMEN AND PELVIS WITHOUT CONTRAST TECHNIQUE: Multidetector CT imaging of the abdomen and pelvis was performed following the standard  protocol without IV contrast. COMPARISON:  06/09/2018. FINDINGS: Lower chest: Small to moderate-sized right pleural effusion, increased. Bilateral lower lobe atelectasis. Hepatobiliary: 1.9 cm gallstone in the gallbladder. No gallbladder wall thickening or pericholecystic fluid. Normal appearing liver. Pancreas: Unremarkable. No pancreatic ductal dilatation or surrounding inflammatory changes. Spleen: Normal in size without focal abnormality. Adrenals/Urinary Tract: Adrenal glands are unremarkable. Kidneys are normal, without renal calculi, focal  lesion, or hydronephrosis. Bladder is unremarkable. Stomach/Bowel: Unremarkable stomach, small bowel and colon. No evidence of appendicitis. Vascular/Lymphatic: Atheromatous arterial calcifications without aneurysm. No enlarged lymph nodes. Reproductive: Mildly enlarged prostate gland. Other: Small to moderate-sized umbilical hernia containing fat. Small left inguinal hernia containing fat. Musculoskeletal: Lumbar and lower thoracic spine degenerative changes. IMPRESSION: 1. No acute abdominal or pelvic abnormality. 2. Small to moderate-sized right pleural effusion, increased. 3. Bilateral lower lobe atelectasis. 4. Cholelithiasis. 5. Small to moderate-sized umbilical hernia containing fat. Electronically Signed   By: Claudie Revering M.D.   On: 08/07/2018 21:14   Dg Chest Port 1 View  Result Date: 08/08/2018 CLINICAL DATA:  Status post right thoracentesis today. EXAM: PORTABLE CHEST 1 VIEW COMPARISON:  Single-view of the chest 08/07/2018. FINDINGS: Small right pleural effusion is decreased after thoracentesis. No pneumothorax. Mild right basilar atelectasis noted. Small left pleural effusion and mild basilar atelectasis are seen. Heart size is normal. Atherosclerosis noted. IMPRESSION: Decreased right pleural effusion after thoracentesis. Negative for pneumothorax. Small left pleural effusion and bibasilar atelectasis. Atherosclerosis. Electronically Signed   By: Inge Rise M.D.   On: 08/08/2018 14:49   Dg Chest Portable 1 View  Result Date: 08/07/2018 CLINICAL DATA:  Short of breath EXAM: PORTABLE CHEST 1 VIEW COMPARISON:  07/10/2018, 05/22/2018 FINDINGS: Small right-sided pleural effusion. Mild cardiomegaly with slight central congestion. Patchy interstitial and alveolar disease at the bases. Aortic atherosclerosis. No pneumothorax. IMPRESSION: 1. Mild cardiomegaly with vascular congestion and small right-sided pleural effusion. Slight increased interstitial opacity could be due to mild edema 2. Patchy  airspace disease at the bases, possible superimposed atelectasis or pneumonia Electronically Signed   By: Donavan Foil M.D.   On: 08/07/2018 18:46   US Abdomen Limited Ruq  Result Date: 08/07/2018 CLINICAL DATA:  Right upper quadrant abdominal pain. EXAM: ULTRASOUND ABDOMEN LIMITED RIGHT UPPER QUADRANT COMPARISON:  None. FINDINGS: Gallbladder: Single shadowing gallstone identified measuring roughly 2.5 cm in diameter. No evidence of gallbladder wall thickening or sonographic Murphy sign. Common bile duct: Diameter: 2.4 mm Liver: No focal lesion identified. No evidence of intrahepatic biliary ductal dilatation. Within normal limits in parenchymal echogenicity. Portal vein is patent on color Doppler imaging with normal direction of blood flow towards the liver. Other: None. IMPRESSION: Cholelithiasis with single shadowing gallstone. No evidence of acute cholecystitis or biliary obstruction. Electronically Signed   By: Aletta Edouard M.D.   On: 08/07/2018 20:14   US Thoracentesis Asp Pleural Space W/img Guide  Result Date: 08/08/2018 INDICATION: Cough and right pleural effusion. EXAM: ULTRASOUND GUIDED RIGHT THORACENTESIS MEDICATIONS: None. COMPLICATIONS: None immediate. PROCEDURE: An ultrasound guided thoracentesis was thoroughly discussed with the patient and questions answered. The benefits, risks, alternatives and complications were also discussed. The patient understands and wishes to proceed with the procedure. Written consent was obtained. Ultrasound was performed to localize and mark an adequate pocket of fluid in the right chest. The area was then prepped and draped in the normal sterile fashion. 1% Lidocaine was used for local anesthesia. Under ultrasound guidance a 6 Fr Safe-T-Centesis catheter was introduced. Thoracentesis was performed. The catheter was removed  and a dressing applied. FINDINGS: A total of approximately 575 mL of amber fluid was removed. Samples were sent to the laboratory as  requested by the clinical team. IMPRESSION: Successful ultrasound guided right thoracentesis yielding 575 mL of pleural fluid. Electronically Signed   By: Markus Daft M.D.   On: 08/08/2018 14:45      ASSESSMENT & PLAN:  1. Anemia, unspecified type   2. CKD (chronic kidney disease) stage 4, GFR 15-29 ml/min (HCC)    Anemia: multifactorial with possible causes including chronic blood loss, hyper/hypothyroidism, nutritional deficiency, infection/chronic inflammation, hemolysis, underlying bone marrow disorders. In his case, probably secondary to chronic kidney disease. Will check CBC w differential, CMP,  ferritin,blood smear, TSH,  monoclonal gammopathy evaluation.   If multiple myeloma panel is negative, will recommend starting erythropoietin replacement therapy.  Patient will return in 2 weeks for discussion of blood work results and possible discussion of starting erythropoietin replacement therapy with Retacrit. Orders Placed This Encounter  Procedures   TSH    Standing Status:   Future    Number of Occurrences:   1    Standing Expiration Date:   08/13/2019   CBC with Differential/Platelet    Standing Status:   Future    Number of Occurrences:   1    Standing Expiration Date:   08/13/2019   Multiple Myeloma Panel (SPEP&IFE w/QIG)    Standing Status:   Future    Number of Occurrences:   1    Standing Expiration Date:   08/13/2019   Kappa/lambda light chains    Standing Status:   Future    Number of Occurrences:   1    Standing Expiration Date:   08/13/2019   Technologist smear review    Standing Status:   Future    Number of Occurrences:   1    Standing Expiration Date:   08/13/2019    All questions were answered. The patient knows to call the clinic with any problems questions or concerns.  cc Baxter Hire, MD    Return of visit: 2 weeks Thank you for this kind referral and the opportunity to participate in the care of this patient. A copy of today's note is routed  to referring provider  Total face to face encounter time for this patient visit was 45 min. >50% of the time was  spent in counseling and coordination of care.    Earlie Server, MD, PhD Hematology Oncology [facility] at [facility] Pager- [phone] 08/13/2018

## 2018-08-14 ENCOUNTER — Ambulatory Visit: Payer: Self-pay | Admitting: *Deleted

## 2018-08-14 LAB — MULTIPLE MYELOMA PANEL, SERUM
Albumin SerPl Elph-Mcnc: 3.2 g/dL (ref 2.9–4.4)
Albumin/Glob SerPl: 1 (ref 0.7–1.7)
Alpha 1: 0.3 g/dL (ref 0.0–0.4)
Alpha2 Glob SerPl Elph-Mcnc: 0.9 g/dL (ref 0.4–1.0)
B-Globulin SerPl Elph-Mcnc: 1 g/dL (ref 0.7–1.3)
Gamma Glob SerPl Elph-Mcnc: 1.2 g/dL (ref 0.4–1.8)
Globulin, Total: 3.4 g/dL (ref 2.2–3.9)
IgA: 461 mg/dL — ABNORMAL HIGH (ref 61–437)
IgG (Immunoglobin G), Serum: 1466 mg/dL (ref 603–1613)
IgM (Immunoglobulin M), Srm: 44 mg/dL (ref 15–143)
Total Protein ELP: 6.6 g/dL (ref 6.0–8.5)

## 2018-08-14 LAB — KAPPA/LAMBDA LIGHT CHAINS
Kappa free light chain: 156.1 mg/L — ABNORMAL HIGH (ref 3.3–19.4)
Kappa, lambda light chain ratio: 1.21 (ref 0.26–1.65)
Lambda free light chains: 128.9 mg/L — ABNORMAL HIGH (ref 5.7–26.3)

## 2018-08-15 DIAGNOSIS — N183 Chronic kidney disease, stage 3 (moderate): Secondary | ICD-10-CM | POA: Diagnosis not present

## 2018-08-15 DIAGNOSIS — I130 Hypertensive heart and chronic kidney disease with heart failure and stage 1 through stage 4 chronic kidney disease, or unspecified chronic kidney disease: Secondary | ICD-10-CM | POA: Diagnosis not present

## 2018-08-15 DIAGNOSIS — K922 Gastrointestinal hemorrhage, unspecified: Secondary | ICD-10-CM | POA: Diagnosis not present

## 2018-08-15 DIAGNOSIS — J9622 Acute and chronic respiratory failure with hypercapnia: Secondary | ICD-10-CM | POA: Diagnosis not present

## 2018-08-15 DIAGNOSIS — D62 Acute posthemorrhagic anemia: Secondary | ICD-10-CM | POA: Diagnosis not present

## 2018-08-15 DIAGNOSIS — D500 Iron deficiency anemia secondary to blood loss (chronic): Secondary | ICD-10-CM | POA: Diagnosis not present

## 2018-08-15 DIAGNOSIS — E1122 Type 2 diabetes mellitus with diabetic chronic kidney disease: Secondary | ICD-10-CM | POA: Diagnosis not present

## 2018-08-15 DIAGNOSIS — J439 Emphysema, unspecified: Secondary | ICD-10-CM | POA: Diagnosis not present

## 2018-08-15 DIAGNOSIS — I5033 Acute on chronic diastolic (congestive) heart failure: Secondary | ICD-10-CM | POA: Diagnosis not present

## 2018-08-16 ENCOUNTER — Other Ambulatory Visit: Payer: Self-pay | Admitting: *Deleted

## 2018-08-16 DIAGNOSIS — Z8719 Personal history of other diseases of the digestive system: Secondary | ICD-10-CM | POA: Diagnosis not present

## 2018-08-16 DIAGNOSIS — N390 Urinary tract infection, site not specified: Secondary | ICD-10-CM | POA: Diagnosis not present

## 2018-08-16 NOTE — Patient Outreach (Addendum)
[employer] [facility]) Care Management [facility] CM Multidisciplinary Case conference- Update 08/16/2018  Arthur [name] 11/29/1933 [id number]   [facility] CM Multidisciplinary Case Discussion re:  Arthur [name], 85 y/o malereferred to [place] byinpatient RN CM during recent hospital visit July 4-8, 2020 for acute on chronic respiratory failure with hypoxia secondary to acute on chronic CHF; patient was noted to have (R) pleural effusion and had thoracentesis during hospital visit with 1.2 liters of fluid removed. Patient was discharged home to self-care with ongoing home health services through [facility].Patient has history including, but not limited to, DM; HTN/ HLD; obesity; COPD, on home O2; anemia of unknown origin; chronic respiratory failure; CKD- III.  Unfortunately patient experienced hospital re-admission 14 days after his last hospital discharge, from July 22-24, 2020 for anemia and GI bleeding. Patient was given one unit of PRBC's while hospitalized and was again discharged home to self-care with established home health services in place.  Noted from review of EMR prior to case discussion that patient was again re-admitted to hospital late last night with (R) UQ pain, acute renal failure, UTI.  At time of writing, patient remains hospitalized.  Barriers: -- 6 recent inpatient hospital admissions for a variety of issues in 85 y/o patient that had never been previously hospitalized: ---- May 21-26, 2020: sepsis/ cellulitis CHF exacerbation ---- June 7-12, 2020: 10 days post- discharge:  Anemia/ GI bleeding: full GI workup completed, no definitive source of GI bleeding found ---- June 25-July 05, 2018: 12 post- discharge: ongoing anemia secondary to GI bleeding of unknown cause ---- July 4-8, 2020: 1 day post discharge: CHF exacerbation: [facility] referral July 10 ---- July 22-24, 2020: 14 days post- discharge: patient called by PCP to go to hospital for admission related to  low Hgb; patient asymtomatic ---- August 5 - 7, 2020: admitted 12 days post last discharge: UTI; recurrent (R) pleural effusion- had thoracentesis with 600 cc drained; patient was approached by palliative care team and hospice was recommended at time of discharge, which patient's family refused; today is day 7 post- hospital discharge from this hospital visit  -- lives alone, with numerous family members close by (on same property/ walking distance); supportive family network  -- patient consistently/ adamantly declines SNF rehabilitation visits: is DNR; family reticent to accept option of palliative care, given that prior to recent hospitalizations, patient had been successfully managing at home without prior hospitalizations  -- initial GI provider signed off/ "released" patient from ongoing follow up on July 15, 2018, at time of post-discharge office visit  -- multiple chronic disease states in patient that was previously successfully managing at home without problems:   ---- multiple hypoglycemic episodes at home post- hospital discharges with first 4 hospitalizations; discharge instructions with changes to insulin dosing not discussed with caregiver related to visitation restrictions around COVID-19: addressed and clarified with PCP by [facility] RN CM- improved at time of writing ---- patient/ caregiver have very good baseline understanding of self-health management CHF ---- palliative care option presented to PCP 07/13/2018  [facility] CM Team Discussion:  -- Attended most recent post-hospital discharge office visits to GI and hematology providers (both newly involved); extensive lab work completed during hematology appointment to rule out multiple myeloma; patient to return to hematology provider 08/27/18 for results/ plan of care; possibility of patient receiving erythropoietin injections if not multiple myeloma -- with last outreach to caregiver, prior to hematology appointment, [facility] RN CM had  extensive discussion around palliative care/ hospice  options and coached caregiver on talking points to cover with hematology provider and PCP  From 08/08/2018 discussion: -- renal and GI referrals placed; initial GI provider signed off 07/15/2018- new referral has been placed with scheduled appointment 08/12/2018 -- hematology referral placed; office visit pending; scheduled for 08/13/2018 -- patient asymptomatic with episodes of anemia; full GI work up completed with prior hospitalizations, with no definitive source of GI bleed found -- no overt issues around nutrition suspected -- patient/ family very engaged with PCP, home health team, and [facility] team  Plan:  -- continue to follow patient closely; continue coaching caregiver around talking points with providers regarding overall plan/ goals of care  From 08/08/2018: -- continue to follow patient closely; continue coaching caregiver around making most of specialist provider appointments -- continue to slowly introduce/ discuss with caregiver option of community palliative care -- Follow up case discussion next week for update on current hospital course, upcoming specialists appointments scheduled next week  Oneta Rack, RN, BSN, [place] Coordinator [facility] Care Management  [phone]

## 2018-08-19 DIAGNOSIS — E1121 Type 2 diabetes mellitus with diabetic nephropathy: Secondary | ICD-10-CM | POA: Diagnosis not present

## 2018-08-19 DIAGNOSIS — K922 Gastrointestinal hemorrhage, unspecified: Secondary | ICD-10-CM | POA: Diagnosis not present

## 2018-08-19 DIAGNOSIS — D62 Acute posthemorrhagic anemia: Secondary | ICD-10-CM | POA: Diagnosis not present

## 2018-08-19 DIAGNOSIS — J90 Pleural effusion, not elsewhere classified: Secondary | ICD-10-CM | POA: Diagnosis not present

## 2018-08-19 DIAGNOSIS — Z09 Encounter for follow-up examination after completed treatment for conditions other than malignant neoplasm: Secondary | ICD-10-CM | POA: Diagnosis not present

## 2018-08-19 DIAGNOSIS — N183 Chronic kidney disease, stage 3 (moderate): Secondary | ICD-10-CM | POA: Diagnosis not present

## 2018-08-19 DIAGNOSIS — E1122 Type 2 diabetes mellitus with diabetic chronic kidney disease: Secondary | ICD-10-CM | POA: Diagnosis not present

## 2018-08-19 DIAGNOSIS — D649 Anemia, unspecified: Secondary | ICD-10-CM | POA: Diagnosis not present

## 2018-08-19 DIAGNOSIS — I130 Hypertensive heart and chronic kidney disease with heart failure and stage 1 through stage 4 chronic kidney disease, or unspecified chronic kidney disease: Secondary | ICD-10-CM | POA: Diagnosis not present

## 2018-08-19 DIAGNOSIS — J9622 Acute and chronic respiratory failure with hypercapnia: Secondary | ICD-10-CM | POA: Diagnosis not present

## 2018-08-19 DIAGNOSIS — Z794 Long term (current) use of insulin: Secondary | ICD-10-CM | POA: Diagnosis not present

## 2018-08-19 DIAGNOSIS — N390 Urinary tract infection, site not specified: Secondary | ICD-10-CM | POA: Diagnosis not present

## 2018-08-19 DIAGNOSIS — I5033 Acute on chronic diastolic (congestive) heart failure: Secondary | ICD-10-CM | POA: Diagnosis not present

## 2018-08-19 DIAGNOSIS — J439 Emphysema, unspecified: Secondary | ICD-10-CM | POA: Diagnosis not present

## 2018-08-19 DIAGNOSIS — D500 Iron deficiency anemia secondary to blood loss (chronic): Secondary | ICD-10-CM | POA: Diagnosis not present

## 2018-08-20 ENCOUNTER — Encounter: Payer: Self-pay | Admitting: *Deleted

## 2018-08-20 ENCOUNTER — Other Ambulatory Visit: Payer: Self-pay | Admitting: *Deleted

## 2018-08-20 ENCOUNTER — Telehealth: Payer: Self-pay | Admitting: Nurse Practitioner

## 2018-08-20 DIAGNOSIS — I5033 Acute on chronic diastolic (congestive) heart failure: Secondary | ICD-10-CM | POA: Diagnosis not present

## 2018-08-20 DIAGNOSIS — N183 Chronic kidney disease, stage 3 (moderate): Secondary | ICD-10-CM | POA: Diagnosis not present

## 2018-08-20 DIAGNOSIS — J439 Emphysema, unspecified: Secondary | ICD-10-CM | POA: Diagnosis not present

## 2018-08-20 DIAGNOSIS — K922 Gastrointestinal hemorrhage, unspecified: Secondary | ICD-10-CM | POA: Diagnosis not present

## 2018-08-20 DIAGNOSIS — D500 Iron deficiency anemia secondary to blood loss (chronic): Secondary | ICD-10-CM | POA: Diagnosis not present

## 2018-08-20 DIAGNOSIS — I130 Hypertensive heart and chronic kidney disease with heart failure and stage 1 through stage 4 chronic kidney disease, or unspecified chronic kidney disease: Secondary | ICD-10-CM | POA: Diagnosis not present

## 2018-08-20 DIAGNOSIS — E1122 Type 2 diabetes mellitus with diabetic chronic kidney disease: Secondary | ICD-10-CM | POA: Diagnosis not present

## 2018-08-20 DIAGNOSIS — D62 Acute posthemorrhagic anemia: Secondary | ICD-10-CM | POA: Diagnosis not present

## 2018-08-20 DIAGNOSIS — J9622 Acute and chronic respiratory failure with hypercapnia: Secondary | ICD-10-CM | POA: Diagnosis not present

## 2018-08-20 NOTE — Telephone Encounter (Signed)
Called daughter [name] to schedule Palliative Consult, no answer.  Left message with reason for call along with my name and contact number

## 2018-08-20 NOTE — Patient Outreach (Signed)
Arthur [name]) [place] Telephone Outreach PCP office completes Transition of Care follow up post-hospital discharge Post-hospital discharge day # 11  08/20/2018  Arthur [name] Jan 05, 1933 [id number]  Successful outgoing telephone call to Arthur [name], daughter/ primary caregiver on [facility], for Arthur [name], 85 y/o malereferred to [place] byinpatient RN CM during recent hospital visit July 4-8, 2020 for acute on chronic respiratory failure with hypoxia secondary to acute on chronic CHF; patient was noted to have (R) pleural effusion and had thoracentesis during hospital visit with 1.2 liters of fluid removed. Patient was discharged home to self-care with ongoing home health services through [facility].Patient has history including, but not limited to, DM; HTN/ HLD; obesity; COPD, on home O2; anemia of unknown origin; chronic respiratory failure; CKD- III.  Unfortunately patient experienced hospital re-admission 14 days after previous hospital discharge, from July 22-24, 2020 for anemia and GI bleeding. Patient was given one unit of PRBC's while hospitalized and was again discharged home to self-care with established home health services in place.  Patient then experienced another hospital admission 11 days after hospital discharge August 5-7, 2020 with (R) UQ pain, acute renal failure, UTI. During this admission, patient was noted to have recurrent (R) pleural effusion and underwent thoracentesis with drainage of 600 cc fluid-- this is hospital admission # 6 since May 2020, in patient that had previously never been hospitalized.   HIPAA/ identity verified with caregiver.  Pleasant phone call.  Caregiver reports that patient "seems to be better" and confirms that she continues visiting with patient every day; states patient does not complain of pain and denies new/ recent falls.  Denies current clinical concerns around patient's ongoing  condition post-most recent hospital discharge.    Caregiver further reports:  -- attended new hematology/ oncology provider on 08/13/2018 and PCP office visit yesterday ---- reports hematologist did lab work to check for kidney function/ CKD as cause of ongoing anemia, "plus a whole bunch of other blood tests;" states that hematologist told patient/ caregiver that they would discuss results of all blood testing at time of next scheduled office visit 08/27/18.  Thus far, has not received any follow up from hematologist around results of blood work obtained at initial office visit ---- PCP office visit: confirmed that she discussed her concerns with hospital discharge MD discontinuing patient's medications (insulin/ diuretics) and confirmed that PCP advised to continue these medications; reports CXR and more blood tests were done; has not heard back with results; confirmed that she discussed palliative care options with PCP, as we had previously discussed: confirms that PCP is in agreement, and that palliative care consult referral was placed by PCP; has not yet heard from palliative care team.  Made caregiver aware that I am available to assist should she wish for assistance in following up on referral placed yesterday.  -- home health services continueand she again confirms that she communicates regularly with home health team- reports visit form nurse yesterday and visit with PT today  Self-health management of chronic disease states of CHF, DM, and newly diagnosed anemia/ recurrent pleural effusion:  -- continues monitoring and recording daily weights at home: again states that patient's breathing/fluid status "seem to be fine this week."  Reports SaO2 level at PCP office visit yesterday was 99% after activity of walking into PCP office; confirms that patient continues using home O2 at 2 L/min.  Reports daily weight ranges at home as "same as it has been,  223-226 lbs" and confirms that she  continues reviewing his daily weights every day when she visits patient at his home.  Reports appetite "seems better" since last hospital discharge.  Using teach back method, reiterated with caregiver signs/ symptoms yellow CHF zone along with corresponding action plan, and caregiver verbalizes an excellent understanding of same.  Denies that patient has had any recent problems around his breathing status.  -- continues to report no further issues around hypoglycemia;  Continues holding meal time insulin for blood sugars <100; caregiver continues reviewing all blood sugars at home when she visits after working  [employer] further issues, concerns, or problems today. Iconfirmed thatshehasmy direct phone number, the main [facility] CM office phone number, and the [facility] CM 24-hour nurse advice phone number should issues arise prior to next scheduled [place] outreachpost-upcoming scheduled hematology provider office visit next week. Encouragedcaregiverto contact me directly if needs, questions, issues, or concerns arise prior to next scheduled outreach;sheagreed to do so.   Plan:  Patient will take medications as prescribed and will attend all scheduled provider appointments  Patient will promptly notify care providers for any new concerns/ issues/ problems that arise  Patient will actively participate in home health services as ordered post-hospital discharge  Patient will continue monitoring/recording daily blood sugars 4 times per day  Patient willcontinuedaily weight monitoring and recording at home  [place] outreach to continue with scheduled phone call to caregiver next week, post-upcomingscheduledprovideroffice visit  [facility] CM Care Plan Problem One     Most Recent Value  Care Plan Problem One  High Risk for hospital readmission, related to/ as evidenced by multiple recent hospitalizations for multiple chronic conditions  Role Documenting the Problem One   Care Management Coordinator  Care Plan for Problem One  Active  [facility] Long Term Goal   Over the next 31 days, patient will not experience hospital readmission, as evidenced by patient/ caregiver reporting and review of EMR during [facility] RN CM outreach  [facility] Long Term Goal Start Date  08/12/18  Interventions for Problem One Long Term Goal  Discussed with caregiver patient's current clinical condition and progression at home after most recent hospital discharge,  reviewed with caregiver recent and upcoming provider appointments and confirmed that caregiver continues providing transportation to all provider appointments,  confirmed that caregiver has no current acute clinical concerns,  discussed yesterday's PCP office visit/ plan of care with caregiver    [facility] CM Care Plan Problem Two     Most Recent Value  Care Plan Problem Two  Self-health management of chronic disease state of CHF, as evidenced by recent hospitalization for CHF exacerbation and caregiver reporting   Role Documenting the Problem Two  Care Management Coordinator  Care Plan for Problem Two  Not Active  Interventions for Problem Two Long Term Goal   Confirmed with caregiver that patient has continued monitoring/ recording daily weights at home and reviewed recent weight ranges with caregiver,  using teach back method, reiterated with caregiver signs/ symptoms yellow CHF zone along with corresponding action plan and confirmed that caregiver is able to independently verbalize same without promting,  confirmed that caregiver reviews patient's daily weights regularly at home  [facility] Long Term Goal  Over the next 31 days, patient will monitor and record daily weights, as evidenced by review of same/ caregiver reporting during [place] outreach  [facility] Long Term Goal Start Date  07/19/18  [facility] Long Term Goal Met Date  08/20/18 [facility] Met]  [facility] CM Care Plan Problem Three     Most Recent Value  Care Plan Problem Three  Need for ongoing follow up  of newly diagnosed anemia/ GI bleeding related to/ as evidenced by multiple recent hospitalizations for anemia  Role Documenting the Problem Three  Care Management Coordinator  Care Plan for Problem Three  Active  [facility] Long Term Goal   Over the next 45 days, patient/ caregiver will verbalize ongoing plan for management of patient's newly diagnosed anemia, as evidenced by caregiver reporting during [place] CM outreach   [place] Term Goal Start Date  07/29/18  Interventions for Problem Three Long Term Goal  Reviewed with caregiver recent hematology appointment last week and discussed overall plan of care after initial referral visit,  confirmed that caregiver will transport patient to follow up appointment with hematologist scheduled for next week  [facility] CM Short Term Goal #1   Over the next 30 days, caregiver will verbalize plan of care at home around recently placed referral for community palliative care, as evidenced by caregiver reporting during [facility] RN CM outreach  [facility] CM Short Term Goal #1 Start Date  08/20/18  Interventions for Short Term Goal #1  Confirmed that caregiver received and has begun reviewing previously mailed printed material about palliative care concept,  confirmed that caregiver discussed with PCP at yesterday's office visit and that community palliative care consult/ referral was placed at that time     Oneta Rack, RN, BSN, [employer] Coordinator [facility] Care Management  [phone]

## 2018-08-21 ENCOUNTER — Encounter: Payer: Self-pay | Admitting: Oncology

## 2018-08-21 ENCOUNTER — Other Ambulatory Visit: Payer: Self-pay | Admitting: Oncology

## 2018-08-21 ENCOUNTER — Telehealth: Payer: Self-pay | Admitting: Nurse Practitioner

## 2018-08-21 DIAGNOSIS — D631 Anemia in chronic kidney disease: Secondary | ICD-10-CM | POA: Insufficient documentation

## 2018-08-21 DIAGNOSIS — N189 Chronic kidney disease, unspecified: Secondary | ICD-10-CM | POA: Insufficient documentation

## 2018-08-21 HISTORY — DX: Anemia in chronic kidney disease: D63.1

## 2018-08-21 NOTE — Telephone Encounter (Signed)
Returned cal back to daughter Arthur [name] and after discussing Palliative care she was in agreement with this.  I have scheduled a [place] for 09/02/18 @ 4:30 PM.

## 2018-08-22 DIAGNOSIS — D500 Iron deficiency anemia secondary to blood loss (chronic): Secondary | ICD-10-CM | POA: Diagnosis not present

## 2018-08-22 DIAGNOSIS — K922 Gastrointestinal hemorrhage, unspecified: Secondary | ICD-10-CM | POA: Diagnosis not present

## 2018-08-22 DIAGNOSIS — D62 Acute posthemorrhagic anemia: Secondary | ICD-10-CM | POA: Diagnosis not present

## 2018-08-22 DIAGNOSIS — J439 Emphysema, unspecified: Secondary | ICD-10-CM | POA: Diagnosis not present

## 2018-08-22 DIAGNOSIS — N183 Chronic kidney disease, stage 3 (moderate): Secondary | ICD-10-CM | POA: Diagnosis not present

## 2018-08-22 DIAGNOSIS — I5033 Acute on chronic diastolic (congestive) heart failure: Secondary | ICD-10-CM | POA: Diagnosis not present

## 2018-08-22 DIAGNOSIS — J9622 Acute and chronic respiratory failure with hypercapnia: Secondary | ICD-10-CM | POA: Diagnosis not present

## 2018-08-22 DIAGNOSIS — E1122 Type 2 diabetes mellitus with diabetic chronic kidney disease: Secondary | ICD-10-CM | POA: Diagnosis not present

## 2018-08-22 DIAGNOSIS — I130 Hypertensive heart and chronic kidney disease with heart failure and stage 1 through stage 4 chronic kidney disease, or unspecified chronic kidney disease: Secondary | ICD-10-CM | POA: Diagnosis not present

## 2018-08-26 DIAGNOSIS — D500 Iron deficiency anemia secondary to blood loss (chronic): Secondary | ICD-10-CM | POA: Diagnosis not present

## 2018-08-26 DIAGNOSIS — I5033 Acute on chronic diastolic (congestive) heart failure: Secondary | ICD-10-CM | POA: Diagnosis not present

## 2018-08-26 DIAGNOSIS — D62 Acute posthemorrhagic anemia: Secondary | ICD-10-CM | POA: Diagnosis not present

## 2018-08-26 DIAGNOSIS — E1122 Type 2 diabetes mellitus with diabetic chronic kidney disease: Secondary | ICD-10-CM | POA: Diagnosis not present

## 2018-08-26 DIAGNOSIS — J9622 Acute and chronic respiratory failure with hypercapnia: Secondary | ICD-10-CM | POA: Diagnosis not present

## 2018-08-26 DIAGNOSIS — J439 Emphysema, unspecified: Secondary | ICD-10-CM | POA: Diagnosis not present

## 2018-08-26 DIAGNOSIS — N183 Chronic kidney disease, stage 3 (moderate): Secondary | ICD-10-CM | POA: Diagnosis not present

## 2018-08-26 DIAGNOSIS — K922 Gastrointestinal hemorrhage, unspecified: Secondary | ICD-10-CM | POA: Diagnosis not present

## 2018-08-26 DIAGNOSIS — I130 Hypertensive heart and chronic kidney disease with heart failure and stage 1 through stage 4 chronic kidney disease, or unspecified chronic kidney disease: Secondary | ICD-10-CM | POA: Diagnosis not present

## 2018-08-27 ENCOUNTER — Other Ambulatory Visit: Payer: Self-pay

## 2018-08-27 ENCOUNTER — Inpatient Hospital Stay: Payer: Medicare HMO

## 2018-08-27 ENCOUNTER — Inpatient Hospital Stay (HOSPITAL_BASED_OUTPATIENT_CLINIC_OR_DEPARTMENT_OTHER): Payer: Medicare HMO | Admitting: Oncology

## 2018-08-27 ENCOUNTER — Encounter: Payer: Self-pay | Admitting: Oncology

## 2018-08-27 VITALS — BP 140/65 | HR 82 | Temp 98.2°F | Resp 18 | Wt 226.6 lb

## 2018-08-27 DIAGNOSIS — N184 Chronic kidney disease, stage 4 (severe): Secondary | ICD-10-CM

## 2018-08-27 DIAGNOSIS — J90 Pleural effusion, not elsewhere classified: Secondary | ICD-10-CM | POA: Diagnosis not present

## 2018-08-27 DIAGNOSIS — Z79899 Other long term (current) drug therapy: Secondary | ICD-10-CM | POA: Diagnosis not present

## 2018-08-27 DIAGNOSIS — D509 Iron deficiency anemia, unspecified: Secondary | ICD-10-CM | POA: Diagnosis not present

## 2018-08-27 DIAGNOSIS — E1122 Type 2 diabetes mellitus with diabetic chronic kidney disease: Secondary | ICD-10-CM | POA: Diagnosis not present

## 2018-08-27 DIAGNOSIS — D649 Anemia, unspecified: Secondary | ICD-10-CM

## 2018-08-27 DIAGNOSIS — J439 Emphysema, unspecified: Secondary | ICD-10-CM | POA: Diagnosis not present

## 2018-08-27 DIAGNOSIS — N390 Urinary tract infection, site not specified: Secondary | ICD-10-CM | POA: Diagnosis not present

## 2018-08-27 DIAGNOSIS — D631 Anemia in chronic kidney disease: Secondary | ICD-10-CM

## 2018-08-27 DIAGNOSIS — I129 Hypertensive chronic kidney disease with stage 1 through stage 4 chronic kidney disease, or unspecified chronic kidney disease: Secondary | ICD-10-CM | POA: Diagnosis not present

## 2018-08-27 LAB — HEMOGLOBIN: Hemoglobin: 7.9 g/dL — ABNORMAL LOW (ref 13.0–17.0)

## 2018-08-27 LAB — HEMATOCRIT: HCT: 25.1 % — ABNORMAL LOW (ref 39.0–52.0)

## 2018-08-27 MED ORDER — EPOETIN ALFA-EPBX 40000 UNIT/ML IJ SOLN
40000.0000 [IU] | Freq: Once | INTRAMUSCULAR | Status: AC
  Administered 2018-08-27: 40000 [IU] via SUBCUTANEOUS
  Filled 2018-08-27: qty 1

## 2018-08-27 NOTE — Progress Notes (Signed)
Patient does not offer any problems today.  

## 2018-08-28 ENCOUNTER — Encounter: Payer: Self-pay | Admitting: *Deleted

## 2018-08-28 ENCOUNTER — Other Ambulatory Visit: Payer: Self-pay | Admitting: *Deleted

## 2018-08-28 DIAGNOSIS — J441 Chronic obstructive pulmonary disease with (acute) exacerbation: Secondary | ICD-10-CM | POA: Diagnosis not present

## 2018-08-28 NOTE — Progress Notes (Signed)
Hematology/Oncology  [place] [phone] [phone]   Patient Care Team: Baxter Hire, MD as PCP - General (Internal Medicine) Knox Royalty, RN as [place] Management  REFERRING PROVIDER: Baxter Hire, MD  CHIEF COMPLAINTS/REASON FOR VISIT:  Evaluation of anemia  HISTORY OF PRESENTING ILLNESS:   Arthur [name] is a  85 y.o.  male with PMH listed below was seen in consultation at the request of  Baxter Hire, MD  for evaluation of anemia Patient has chronic comorbidities including diabetes, COPD/emphysema, chronic respiratory failure on home oxygen, CKD stage III, CHF, hypertension and hyperlipidemia. Reviewed patient's previous lab records. Extensive medical records review was performed by me. Anemia has been chronic, dated back to at least 2016.  At that time his hemoglobin was around 11-12. Hemoglobin started to trended down below 10 starting February 2020. 02/11/2018 hemoglobin 9, hematocrit 28.6, 06/17/2018 hemoglobin 7.3 hematocrit of 24, 06/27/2018 hemoglobin 5.8 07/15/2018 hemoglobin 7.5 07/29/2018 hemoglobin 8.2, 08/12/2018, hemoglobin 9.2  Patient reports feeling tired and weak.  Chronic shortness of breath at baseline today.  Daughter [name] was called during the clinical encounter and she was able to listen to the discussion and participate as well.   INTERVAL HISTORY Arthur [name] is a 85 y.o. male who has above history reviewed by me today presents for follow up visit for management of anemia Problems and complaints are listed below: Patient had blood work done during interval and present to discuss lab results sent management plan. He is hard hearing.  Per patient's request, I called patient's daughter [name] and put her on speaker phone.  She is able to hear the conversation and participate in discussion. Patient reports continue to feel more tired and fatigued..   Chronic shortness of breath,  breathing is at baseline. Most recently seen by primary care physician Dr. Edwina Barth for posthospitalization follow-up.  Note reviewed.  Patient is currently on antibiotics for UTI.  Taking Levaquin 250 mg every other day for 5 doses.  Today patient denies any dysuria.    Review of Systems  Constitutional: Positive for appetite change and fatigue. Negative for chills, fever and unexpected weight change.  HENT:   Negative for hearing loss and voice change.   Eyes: Negative for eye problems and icterus.  Respiratory: Positive for shortness of breath. Negative for chest tightness and cough.   Cardiovascular: Negative for chest pain and leg swelling.  Gastrointestinal: Negative for abdominal distention and abdominal pain.  Endocrine: Negative for hot flashes.  Genitourinary: Negative for difficulty urinating, dysuria and frequency.   Musculoskeletal: Negative for arthralgias.  Skin: Negative for itching and rash.  Neurological: Negative for light-headedness and numbness.  Hematological: Negative for adenopathy. Does not bruise/bleed easily.  Psychiatric/Behavioral: Negative for confusion.    MEDICAL HISTORY:  Past Medical History:  Diagnosis Date   Anemia of chronic kidney failure 08/21/2018   CHF (congestive heart failure) (HCC)    CKD (chronic kidney disease), stage III (HCC)    COPD (chronic obstructive pulmonary disease) ([place])    Diabetes mellitus without complication ([place])    Hyperlipidemia    Hypertension     SURGICAL HISTORY: Past Surgical History:  Procedure Laterality Date   COLONOSCOPY WITH PROPOFOL N/A 06/11/2018   Procedure: COLONOSCOPY WITH PROPOFOL;  Surgeon: Jonathon Bellows, MD;  Location: [facility] ENDOSCOPY;  Service: Gastroenterology;  Laterality: N/A;   COLONOSCOPY WITH PROPOFOL N/A 06/13/2018   Procedure: COLONOSCOPY WITH PROPOFOL;  Surgeon: Jonathon Bellows, MD;  Location:  [facility] ENDOSCOPY;  Service: Gastroenterology;  Laterality: N/A;   ESOPHAGOGASTRODUODENOSCOPY Left  06/10/2018   Procedure: ESOPHAGOGASTRODUODENOSCOPY (EGD);  Surgeon: Jonathon Bellows, MD;  Location: [facility] ENDOSCOPY;  Service: Gastroenterology;  Laterality: Left;   GIVENS CAPSULE STUDY N/A 06/28/2018   Procedure: GIVENS CAPSULE STUDY;  Surgeon: Lucilla Lame, MD;  Location: [facility] ENDOSCOPY;  Service: Endoscopy;  Laterality: N/A;   GIVENS CAPSULE STUDY N/A 06/30/2018   Procedure: GIVENS CAPSULE STUDY;  Surgeon: Jonathon Bellows, MD;  Location: [facility] ENDOSCOPY;  Service: Gastroenterology;  Laterality: N/A;    SOCIAL HISTORY: Social History   Socioeconomic History   Marital status: Widowed    Spouse name: Not on file   Number of children: Not on file   Years of education: Not on file   Highest education level: Not on file  Occupational History   Not on file  Social Needs   Financial resource strain: Not hard at all   Food insecurity    Worry: Never true    Inability: Never true   Transportation needs    Medical: No    Non-medical: No  Tobacco Use   Smoking status: Former Smoker    Packs/day: 2.00    Years: 35.00    Pack years: 70.00    Types: Cigarettes    Quit date: 11/04/1982    Years since quitting: 35.8   Smokeless tobacco: Current User  Substance and Sexual Activity   Alcohol use: Not Currently   Drug use: No   Sexual activity: Not Currently  Lifestyle   Physical activity    Days per week: 0 days    Minutes per session: Not on file   Stress: Not at all  [place] on phone: Not on file    Gets together: Not on file    Attends religious service: Not on file    Active member of club or organization: Not on file    Attends meetings of clubs or organizations: Not on file    Relationship status: Not on file   Intimate partner violence    Fear of current or ex partner: Patient refused    Emotionally abused: Patient refused    Physically abused: Patient refused    Forced sexual activity: Patient refused  Other Topics Concern    Not on file  Social History Narrative   Not on file    FAMILY HISTORY: Family History  Problem Relation Age of Onset   COPD Mother    Cancer Father     ALLERGIES:  is allergic to codeine.  MEDICATIONS:  Current Outpatient Medications  Medication Sig Dispense Refill   albuterol (VENTOLIN HFA) 108 (90 Base) MCG/ACT inhaler Inhale 2 puffs into the lungs every 6 (six) hours as needed for wheezing or shortness of breath. 1 Inhaler 2   amLODipine (NORVASC) 2.5 MG tablet Take 2.5 mg by mouth daily.     docusate sodium (COLACE) 100 MG capsule Take by mouth.     ipratropium-albuterol (DUONEB) 0.5-2.5 (3) MG/3ML SOLN Take 3 mLs by nebulization every 6 (six) hours as needed. 360 mL 0   iron polysaccharides (NIFEREX) 150 MG capsule Take 1 capsule (150 mg total) by mouth daily. 30 capsule 0   LEVEMIR 100 UNIT/ML injection Inject 0.12 mLs (12 Units total) into the skin daily. 10 mL 0   levofloxacin (LEVAQUIN) 250 MG tablet Take 1 tablet every other day for 5 doses     nystatin cream (MYCOSTATIN) Apply topically 2 (two) times  daily. 30 g 0   pantoprazole (PROTONIX) 40 MG tablet Take 1 tablet (40 mg total) by mouth 2 (two) times daily. 60 tablet 0   potassium chloride (K-DUR) 10 MEQ tablet Take by mouth.     pravastatin (PRAVACHOL) 10 MG tablet Take 10 mg by mouth at bedtime.     tamsulosin (FLOMAX) 0.4 MG CAPS capsule Take 0.4 mg by mouth daily.     torsemide (DEMADEX) 20 MG tablet Take 20 mg by mouth daily.     No current facility-administered medications for this visit.      PHYSICAL EXAMINATION: ECOG PERFORMANCE STATUS: 2 - Symptomatic, <50% confined to bed Vitals:   08/27/18 1454  BP: 140/65  Pulse: 82  Resp: 18  Temp: 98.2 F (36.8 C)   Filed Weights   08/27/18 1454  Weight: 226 lb 9.6 oz (102.8 kg)    Physical Exam Constitutional:      General: He is not in acute distress.    Appearance: He is ill-appearing.  HENT:     Head: Normocephalic and atraumatic.    Eyes:     General: No scleral icterus.    Pupils: Pupils are equal, round, and reactive to light.  Neck:     Musculoskeletal: Normal range of motion and neck supple.  Cardiovascular:     Rate and Rhythm: Normal rate and regular rhythm.     Heart sounds: Normal heart sounds.  Pulmonary:     Effort: Pulmonary effort is normal. No respiratory distress.     Breath sounds: No wheezing.     Comments: Breathes via nasal cannula oxygen. Bibasilar crackles. Abdominal:     General: Bowel sounds are normal. There is no distension.     Palpations: Abdomen is soft. There is no mass.     Tenderness: There is no abdominal tenderness.  Musculoskeletal: Normal range of motion.        General: No deformity.  Skin:    General: Skin is warm and dry.     Findings: No erythema or rash.  Neurological:     Mental Status: He is alert and oriented to person, place, and time.     Cranial Nerves: No cranial nerve deficit.     Coordination: Coordination normal.  Psychiatric:        Mood and Affect: Mood normal.        Behavior: Behavior normal.        Thought Content: Thought content normal.     LABORATORY DATA:  I have reviewed the data as listed Lab Results  Component Value Date   WBC 10.5 08/13/2018   HGB 7.9 (L) 08/27/2018   HCT 25.1 (L) 08/27/2018   MCV 91.1 08/13/2018   PLT 300 08/13/2018   Recent Labs    07/06/18 1613  07/15/18 1544  08/07/18 1818 08/08/18 0542 08/09/18 0510  NA 139   < > 140   < > 133* 134* 135  K 3.5   < > 3.7   < > 3.3* 3.7 3.2*  CL 95*   < > 95*   < > 89* 90* 92*  CO2 36*   < > 33*   < > 32 30 32  GLUCOSE 131*   < > 137*   < > 199* 190* 110*  BUN 31*   < > 30*   < > 45* 41* 47*  CREATININE 1.96*   < > 2.03*   < > 2.48* 2.25* 2.64*  CALCIUM 8.0*   < > 7.6*   < >  8.7* 8.6* 8.4*  GFRNONAA 31*   < > 29*   < > 23* 26* 21*  GFRAA 36*   < > 34*   < > 27* 30* 25*  PROT 6.0*  --  5.7*  --  8.0  --   --   ALBUMIN 3.0*  --  3.0*  --  3.8  --   --   AST 14*  --  20   --  14*  --   --   ALT 12  --  21  --  12  --   --   ALKPHOS 49  --  57  --  65  --   --   BILITOT 0.8  --  0.3  --  0.9  --   --    < > = values in this interval not displayed.   Iron/TIBC/Ferritin/ %Sat    Component Value Date/Time   IRON 36 (L) 07/15/2018 1544   TIBC 172 (L) 07/15/2018 1544   FERRITIN 535 (H) 07/15/2018 1544   IRONPCTSAT 21 07/15/2018 1544      RADIOGRAPHIC STUDIES: I have personally reviewed the radiological images as listed and agreed with the findings in the report.  Ct Abdomen Pelvis Wo Contrast  Result Date: 08/07/2018 CLINICAL DATA:  Abdominal pain, distention and vomiting. EXAM: CT ABDOMEN AND PELVIS WITHOUT CONTRAST TECHNIQUE: Multidetector CT imaging of the abdomen and pelvis was performed following the standard protocol without IV contrast. COMPARISON:  06/09/2018. FINDINGS: Lower chest: Small to moderate-sized right pleural effusion, increased. Bilateral lower lobe atelectasis. Hepatobiliary: 1.9 cm gallstone in the gallbladder. No gallbladder wall thickening or pericholecystic fluid. Normal appearing liver. Pancreas: Unremarkable. No pancreatic ductal dilatation or surrounding inflammatory changes. Spleen: Normal in size without focal abnormality. Adrenals/Urinary Tract: Adrenal glands are unremarkable. Kidneys are normal, without renal calculi, focal lesion, or hydronephrosis. Bladder is unremarkable. Stomach/Bowel: Unremarkable stomach, small bowel and colon. No evidence of appendicitis. Vascular/Lymphatic: Atheromatous arterial calcifications without aneurysm. No enlarged lymph nodes. Reproductive: Mildly enlarged prostate gland. Other: Small to moderate-sized umbilical hernia containing fat. Small left inguinal hernia containing fat. Musculoskeletal: Lumbar and lower thoracic spine degenerative changes. IMPRESSION: 1. No acute abdominal or pelvic abnormality. 2. Small to moderate-sized right pleural effusion, increased. 3. Bilateral lower lobe atelectasis. 4.  Cholelithiasis. 5. Small to moderate-sized umbilical hernia containing fat. Electronically Signed   By: Claudie Revering M.D.   On: 08/07/2018 21:14   Dg Chest Port 1 View  Result Date: 08/08/2018 CLINICAL DATA:  Status post right thoracentesis today. EXAM: PORTABLE CHEST 1 VIEW COMPARISON:  Single-view of the chest 08/07/2018. FINDINGS: Small right pleural effusion is decreased after thoracentesis. No pneumothorax. Mild right basilar atelectasis noted. Small left pleural effusion and mild basilar atelectasis are seen. Heart size is normal. Atherosclerosis noted. IMPRESSION: Decreased right pleural effusion after thoracentesis. Negative for pneumothorax. Small left pleural effusion and bibasilar atelectasis. Atherosclerosis. Electronically Signed   By: Inge Rise M.D.   On: 08/08/2018 14:49   Dg Chest Portable 1 View  Result Date: 08/07/2018 CLINICAL DATA:  Short of breath EXAM: PORTABLE CHEST 1 VIEW COMPARISON:  07/10/2018, 05/22/2018 FINDINGS: Small right-sided pleural effusion. Mild cardiomegaly with slight central congestion. Patchy interstitial and alveolar disease at the bases. Aortic atherosclerosis. No pneumothorax. IMPRESSION: 1. Mild cardiomegaly with vascular congestion and small right-sided pleural effusion. Slight increased interstitial opacity could be due to mild edema 2. Patchy airspace disease at the bases, possible superimposed atelectasis or pneumonia Electronically Signed   By: Maudie Mercury  Francoise Ceo M.D.   On: 08/07/2018 18:46   US Abdomen Limited Ruq  Result Date: 08/07/2018 CLINICAL DATA:  Right upper quadrant abdominal pain. EXAM: ULTRASOUND ABDOMEN LIMITED RIGHT UPPER QUADRANT COMPARISON:  None. FINDINGS: Gallbladder: Single shadowing gallstone identified measuring roughly 2.5 cm in diameter. No evidence of gallbladder wall thickening or sonographic Murphy sign. Common bile duct: Diameter: 2.4 mm Liver: No focal lesion identified. No evidence of intrahepatic biliary ductal dilatation.  Within normal limits in parenchymal echogenicity. Portal vein is patent on color Doppler imaging with normal direction of blood flow towards the liver. Other: None. IMPRESSION: Cholelithiasis with single shadowing gallstone. No evidence of acute cholecystitis or biliary obstruction. Electronically Signed   By: Aletta Edouard M.D.   On: 08/07/2018 20:14   US Thoracentesis Asp Pleural Space W/img Guide  Result Date: 08/08/2018 INDICATION: Cough and right pleural effusion. EXAM: ULTRASOUND GUIDED RIGHT THORACENTESIS MEDICATIONS: None. COMPLICATIONS: None immediate. PROCEDURE: An ultrasound guided thoracentesis was thoroughly discussed with the patient and questions answered. The benefits, risks, alternatives and complications were also discussed. The patient understands and wishes to proceed with the procedure. Written consent was obtained. Ultrasound was performed to localize and mark an adequate pocket of fluid in the right chest. The area was then prepped and draped in the normal sterile fashion. 1% Lidocaine was used for local anesthesia. Under ultrasound guidance a 6 Fr Safe-T-Centesis catheter was introduced. Thoracentesis was performed. The catheter was removed and a dressing applied. FINDINGS: A total of approximately 575 mL of amber fluid was removed. Samples were sent to the laboratory as requested by the clinical team. IMPRESSION: Successful ultrasound guided right thoracentesis yielding 575 mL of pleural fluid. Electronically Signed   By: Markus Daft M.D.   On: 08/08/2018 14:45      ASSESSMENT & PLAN:  1. Anemia of chronic renal failure, stage 4 (severe) ([place])   2. CKD (chronic kidney disease) stage 4, GFR 15-29 ml/min (HCC)   3. Urinary tract infection without hematuria, site unspecified    Work-up labs were reviewed and discussed with patient in the clinic, also discussed with daughter over the phone. Multiple myeloma nonremarkable. Normal TSH Anemia is most likely secondary to chronic  kidney disease.  Though underlying bone marrow disorders cannot be fully excluded. Iron panel 07/15/2018 showed a ferritin of 535, TIBC 172, iron saturation 21. I recommend starting erythropoietin replacement therapy. Discussed  about the rationale of erythropoietin therapy and side effects including not limited to thrombosis events, stroke, heart attack, progression of underlying cancer, etc. Patient and daughter voiced understanding and willing to proceed.  We will proceed with Retacrit 40,0001 today. CKD avoid nephrotoxins. UTI, finished course of antibiotics.  Follow-up with primary care physician. Follow-up in 4 weeks for repeat blood work and assessment for need of adjustment of Retacrit dosage.  Orders Placed This Encounter  Procedures   CBC with Differential/Platelet    Standing Status:   Future    Standing Expiration Date:   08/28/2019    All questions were answered. The patient knows to call the clinic with any problems questions or concerns.  cc Baxter Hire, MD    Return of visit: 4 weeks.   Earlie Server, MD, PhD Hematology Oncology [facility] Pager- [phone] 08/28/2018

## 2018-08-28 NOTE — Patient Outreach (Signed)
[facility] Care Management [place] Telephone Outreach PCP office completes Transition of Care follow up post-hospital discharge Post- hospital discharge day # 19  08/28/2018  [name] [birth year] [id number]  Successful outgoing telephone call to Arthur [name], daughter/ primary caregiver on [facility] DPR, forWade [name], 85 y/o malereferred to [place] byinpatient RN CM during recent hospital visit July 4-8, 2020 for acute on chronic respiratory failure with hypoxia secondary to acute on chronic CHF; patient was noted to have (R) pleural effusion and had thoracentesis during hospital visit with 1.2 liters of fluid removed. Patient was discharged home to self-care with ongoing home health services through [place] care.Patient has history including, but not limited to, DM; HTN/ HLD; obesity; COPD, on home O2; anemia of unknown origin; chronic respiratory failure; CKD- III.  Unfortunately patient experienced hospital re-admission 14 days afterprevioushospital discharge, from July 22-24, 2020 for anemia and GI bleeding. Patient was given one unit of PRBC's while hospitalized and was again discharged home to self-care with established home health services in place.  Patient then experienced another hospital admission 11 days after hospital discharge August 5-7, 2020with (R) UQ pain, acute renal failure, UTI.During this admission, patient was noted to have recurrent (R) pleural effusion and underwent thoracentesis with drainage of 600 cc fluid-- this is hospital admission # 6 since May 2020, in patient that had previously never been hospitalized.  HIPAA/ identity verified with caregiver. Pleasant phone call.  Daughter reports that patient is "doing much better" and she denies that patient is in pain or had new recent falls; states he tolerated activity of attending oncology provider office visit yesterday without problems.  Continues using walker "all the  time."  Daughter denies concerns around patient's clinical condition and states that she believes patient's "spirits are much better," adding that he "seems to be much happier" since he is feeling better.   Caregiver further reports:  -- attended follow up hematology/ oncology provider yesterday ---- reports lab work completed and oncologist stated that she believes patient is anemic related to CKD; patient was given EPO injection as scheduled; oncologist stated patient's ;ab work "stable," and this verified and reviewed with caregiver ---- all upcoming scheduled appointments reviewed with patient's caregiver today, who reports ongoing reliable transportation through her; caregiver continues attending all provider appointments with patient ---- confirms that she has spoken with community palliative care team and plans to attend virtual visit with team as scheduled on 8/31/20202   -- no changes to medications- caregiver continues to assist with preparing pill box; patient continues taking medications independently once pill box prepared   -- home health services for nursing continue for now- states nurse may be discharging patient soon; confirms that home health PT has signed off, as patient had met his goals.  Confirms that she communicates regularly with home health team- reports visit form nurse yesterday and visit with PT today  Self-health management of chronic disease states of CHF, DM, and newly diagnosed anemia/ recurrent pleural effusion:  -- continues monitoring and recording daily weights at home: reports patient's breathing/fluid status "is fine" and that weight ranges at home "are in his normal range;" caregiver is driving home from work on her way to patient's home and can not remember exact weights, but reports she double checks patient's recorded values "every day."  Continues using home O2; caregiver regularly checks patient's SaO2 levels and states that after yesterdays' office  visit "it was 99% when we got home form the  appointment."  Caregiver remains able to verbalize signs/ symptoms yellow CHF zone along with corresponding action plan  -- no recent hypo/ hyper- glycemic episodes; states this has resolved.  Reports fasting blood sugars between 95-low 100's" and post-prandial blood sugars between 120-180; continues able to verbalize accurate and appropriate action plan for both hypo/hyper- glycemia.   Caregiver continues reviewing all blood sugars at home when she visits after working  -- using teach back method, reiterated with caregiver signs/ symptoms and corresponding action plan for acute symptomatic anemia/ GI bleeding- caregiver denies signs/ symptoms at present  Caregiverdenies further issues, concerns, or problems today. Iconfirmed thatshehasmy direct phone number, the main [facility] CM office phone number, and the [facility] CM 24-hour nurse advice phone number should issues arise prior to next scheduled [place] outreachpost-upcoming scheduled community palliative care visit.  Encouragedcaregiverto contact me directly if needs, questions, issues, or concerns arise prior to next scheduled outreach;sheagreed to do so.  Plan:  Patient will take medications as prescribed and will attend all scheduled provider appointments  Patient will promptly notify care providers for any new concerns/ issues/ problems that arise  Patient will actively participate in home health services as ordered post-hospital discharge  Patient will continue monitoring/recording daily blood sugars 4 times per day  Patient willcontinuedaily weight monitoring and recording at home  [place] outreach to continue with scheduled phonecall tocaregiverin 2 weeks  [facility] CM Care Plan Problem One     Most Recent Value  Care Plan Problem One  High Risk for hospital readmission, related to/ as evidenced by multiple recent hospitalizations for multiple chronic conditions   Role Documenting the Problem One  Care Management Coordinator  Care Plan for Problem One  Active  [facility] Long Term Goal   Over the next 31 days, patient will not experience hospital readmission, as evidenced by patient/ caregiver reporting and review of EMR during [facility] RN CM outreach  [facility] Long Term Goal Start Date  08/12/18  Interventions for Problem One Long Term Goal  Discussed with patient's caregiver his current clinical condition and confirmed that she has no current clinical concerns and believes patient is better than he has been in a long time,  reviewed upcoming provider appointments with caregiver and confirmed that she will continue to provide transportation to and attend with patient, all scheduled provider appointments,  confirmed that patient continues taking medications and that no recent changes have been made to patient's medications    [facility] CM Care Plan Problem Three     Most Recent Value  Care Plan Problem Three  Need for ongoing follow up of newly diagnosed anemia/ GI bleeding related to/ as evidenced by multiple recent hospitalizations for anemia  Role Documenting the Problem Three  Care Management Coordinator  Care Plan for Problem Three  Active  [facility] Long Term Goal   Over the next 45 days, patient/ caregiver will verbalize ongoing plan for management of patient's newly diagnosed anemia, as evidenced by caregiver reporting during [place] RN CM outreach   [facility] Long Term Goal Start Date  07/29/18  Interventions for Problem Three Long Term Goal  Discussed and reviewed with caregiver recent oncology office visits,  confirmed that patient tolerated EPO injection and activity of office visit yesterday without problems,  using teachback method, confirmed that caregiver is able to verbalize signs/ symptoms ongoing GI bleeding along with accurate and appropriate action plan  [facility] CM Short Term Goal #1   Over the next 30 days, caregiver will  verbalize plan of care at home around recently  placed referral for community palliative care, as evidenced by caregiver reporting during [facility] RN CM outreach  [facility] CM Short Term Goal #1 Start Date  08/20/18  Interventions for Short Term Goal #1  Confirmed with caregiver that she has been contacted by [facility] team and that a scheduled virtual visit is pending for 09/02/2018,  using teachback method, reiterated role of palliative care vs. hospice and home health care,  encouraged caregiver to actively engage with community palliative care team     Oneta Rack, RN, BSN, [place] Coordinator [facility] Care Management  [phone]

## 2018-09-02 ENCOUNTER — Encounter: Payer: Self-pay | Admitting: Nurse Practitioner

## 2018-09-02 ENCOUNTER — Other Ambulatory Visit: Payer: Medicare HMO | Admitting: Nurse Practitioner

## 2018-09-02 ENCOUNTER — Other Ambulatory Visit: Payer: Self-pay

## 2018-09-02 DIAGNOSIS — I130 Hypertensive heart and chronic kidney disease with heart failure and stage 1 through stage 4 chronic kidney disease, or unspecified chronic kidney disease: Secondary | ICD-10-CM | POA: Diagnosis not present

## 2018-09-02 DIAGNOSIS — Z515 Encounter for palliative care: Secondary | ICD-10-CM

## 2018-09-02 DIAGNOSIS — E1122 Type 2 diabetes mellitus with diabetic chronic kidney disease: Secondary | ICD-10-CM | POA: Diagnosis not present

## 2018-09-02 DIAGNOSIS — J439 Emphysema, unspecified: Secondary | ICD-10-CM | POA: Diagnosis not present

## 2018-09-02 DIAGNOSIS — D500 Iron deficiency anemia secondary to blood loss (chronic): Secondary | ICD-10-CM | POA: Diagnosis not present

## 2018-09-02 DIAGNOSIS — I5033 Acute on chronic diastolic (congestive) heart failure: Secondary | ICD-10-CM | POA: Diagnosis not present

## 2018-09-02 DIAGNOSIS — K922 Gastrointestinal hemorrhage, unspecified: Secondary | ICD-10-CM | POA: Diagnosis not present

## 2018-09-02 DIAGNOSIS — D62 Acute posthemorrhagic anemia: Secondary | ICD-10-CM | POA: Diagnosis not present

## 2018-09-02 DIAGNOSIS — I3 Acute pericarditis: Secondary | ICD-10-CM | POA: Diagnosis not present

## 2018-09-02 DIAGNOSIS — N183 Chronic kidney disease, stage 3 (moderate): Secondary | ICD-10-CM | POA: Diagnosis not present

## 2018-09-02 DIAGNOSIS — J9622 Acute and chronic respiratory failure with hypercapnia: Secondary | ICD-10-CM | POA: Diagnosis not present

## 2018-09-02 NOTE — Progress Notes (Signed)
[place] Consult Note Telephone: [phone]  Fax: [phone]  PATIENT NAME: Arthur [name] DOB: 02/04/1933 MRN: [id number]  PRIMARY CARE PROVIDER:   Baxter Hire, MD  REFERRING PROVIDER:  Baxter Hire, MD [place],  [place] [address]  RESPONSIBLE PARTY:   [name] daughter [phone]  Due to the COVID-19 crisis, this visit was done via telemedicine from my office and it was initiated and consent by this patient and or family.  I was asked by Dr Edwina Barth to see Arthur [name] for palliative care for goals of care  RECOMMENDATIONS and PLAN:  1. ACP: DNR re-done to place in Epic/Vynca; will mail copy to Arthur [name]. MOST form to be discussed at next [facility] visit.   2. Dyspneic secondary to COPD/CHF; remain stable at present time. Continue O2, inhalers, inhalation therapy as needed.   3. Weakness improving as reflected in PT d/c met goals, continue to encourage restorative exercises  4. Palliative care encounter Palliative medicine team will continue to support patient, patient's family, and medical team. Visit consisted of counseling and education dealing with the complex and emotionally intense issues of symptom management and palliative care in the setting of serious and potentially life-threatening illness  I spent 60 minutes providing this consultation,  from 4:15pm to 5:15pm. More than 50% of the time in this consultation was spent coordinating communication.   HISTORY OF PRESENT ILLNESS:  Arthur [name] is a 85 y.o. year old male with multiple medical problems including COPD, congestive heart failure, anemia of chronic disease, chronic kidney disease stage 3, diabetes, hypertension, hyperlipidemia, history of GI bleed, allergies, tobacco use, obesity. Hospitalized 5 / 20 / 2020 to 5 / 26 / 2020 for shortness of breath with work of significant for acute hypoxic respiratory failure in the setting of pneumonia with COPD  exacerbation requiring oxygen and nebulizers at discharge. He was treated with antibiotic therapy. He does have feet fungus with ID recommending antifungal cream. He discharged home. He was rehab supplies 6 / 7 / 2020 to 6 / 12 / 2020 due to weakness found a half hemoglobin 3.6 on admission with GI bleed likely lower with persistent rectal bleeding. CT abdomen and pelvis 1.8 cm gallstone. EGD 6 / 8 / 2020 no significant finding or source of bleeding. Colonoscopy 6 / 11 / 2020 no active bleed. Severe anemia required for units to be transfused. Bilateral lower extremity cellulitis with Group B strep bacteremia improved with negative ultrasound for DVT. He did remain on to lead with inhalers and nebulizer treatments. He was discharged home with PT/OT/RN/aid/ Respiratory Care / Education officer, museum. Hospitalize 6 / 25 / 2020 to 7 / 3 / 2020 for severe anemia hemoglobin 5.8 with recurrent cellulitis. 3 hospitalized 7 / 4 / 2020 to 7/8 / 2020 for acute on chronic hypoxic respiratory failure due to acute on chronic diastolic congestive heart failure with right-sided pleural effusions s/p thoracentesis. Healthcare-associated pneumonia. Hospitalize 7 / 22 / 2020 to 7 / 24 / 2020 for acute on chronic anemia with hemoglobin 6.6 requiring transfusion. GI did consult but no further work-up at that time. Unclear etiology. Hospitalize 8 / 5 / 2020 to 8/7 / 2020 for right upper quadrant pain with recurrent right pleural effusion s/p thoracentesis with 600cc strained. UTI requiring antibiotic therapy. Acute on chronic kidney disease stage 3. Chronic diastolic congestive heart failure without sign of Investigation. Palliative care daycare [place] during hospitalization with detailed discussion regarding  advance directives. Her documentation wishes are he would not want to return to the hospital and was discharged home with hospice. He is widowed, lives alone and has two children. He is a retired Furniture conservator/restorer. His children do his yard work, most  of his cooking and housework. He does he use continuous oxygen at home and sleeps in a recliner. Per documentation he is a do not resuscitate. He was discharged home with Home Health. Outpatient palliative care visit scheduled. I called Janice Coffin daughter for palliative care scheduled initial visit. Explain purpose for palliative care visit in lease in agreement. We talked about past medical history in the setting of chronic disease and progression of COPD. We talked about multiple hospitalizations. We talked about thoracentesis for which he had two. We talked about the possibility of reoccurrence. We talked about symptoms and signs to look for. We talked about seeking treatment with initial symptoms rather than waiting. Lisa in agreement. Symptoms of shortness of breath for which he remains on continuous oxygen. We talked about shortness of breath with exertion. Rest he does  get relief. We talked about his functional level. [name]  has been able to shower with minimal assistance, get dressed. He is able to toilet himself. He feeds himself with a good appetite. This morning he was able to make himself an egg. He does live independently with frequent monitoring and checking from his children. We talked about symptoms of pain which he currently is not experiencing. We talked about nausea vomiting which he is not experiencing. He does have intermittent constipation but managed with prune juice. We talked about medical goals of care including aggressive versus conservative vs comfort care. We talked about palliative console during hospitalization. [name] endorses he became upset when hospice was mentioned as she did not feel like her father Arthur [name] was at that point. We talked about talked about her mom passing of COPD with palliative care then transition to hospice. We talked about the services that hospice does provide. [name] endorses she knows that her dad would benefit from physical therapy. He is at and  has goals, discharged from physical therapy. We talked about him continuing to receive nursing services. We talked about realistic expectations, taking one day at a time. We talked about code status and [name] verbalize that he is a DNR. Discuss that under Advance care planning it is not documented in Epic. [name] in agreement to complete new DNR forms, place in [place] and will mail forms to Mr. Bilotti's  home. Talk to that role of palliative care and plan of care. We talked about Merrilee Seashore schedule palliative care visit for 2 weeks for follow-up and [name] in agreement. Therapeutic listening and emotional support provided. Contact information provided. Questions answered to satisfaction. Palliative Care was asked to help address goals of care.   CODE STATUS: DNR  PPS: 50% HOSPICE ELIGIBILITY/DIAGNOSIS: TBD  PAST MEDICAL HISTORY:  Past Medical History:  Diagnosis Date  . Anemia of chronic kidney failure 08/21/2018  . CHF (congestive heart failure) ([place])   . CKD (chronic kidney disease), stage III ([place])   . COPD (chronic obstructive pulmonary disease) ([place])   . Diabetes mellitus without complication ([place])   . Hyperlipidemia   . Hypertension     SOCIAL HX:  Social History   Tobacco Use  . Smoking status: Former Smoker    Packs/day: 2.00    Years: 35.00    Pack years: 70.00    Types: Cigarettes    Quit  date: 11/04/1982    Years since quitting: 35.8  . Smokeless tobacco: Current User  Substance Use Topics  . Alcohol use: Not Currently    ALLERGIES:  Allergies  Allergen Reactions  . Codeine Other (See Comments)    Constipation     PERTINENT MEDICATIONS:  Outpatient Encounter Medications as of 09/02/2018  Medication Sig  . albuterol (VENTOLIN HFA) 108 (90 Base) MCG/ACT inhaler Inhale 2 puffs into the lungs every 6 (six) hours as needed for wheezing or shortness of breath.  Marland Kitchen amLODipine (NORVASC) 2.5 MG tablet Take 2.5 mg by mouth daily.  Marland Kitchen docusate sodium (COLACE) 100 MG capsule Take by  mouth.  Marland Kitchen ipratropium-albuterol (DUONEB) 0.5-2.5 (3) MG/3ML SOLN Take 3 mLs by nebulization every 6 (six) hours as needed.  . iron polysaccharides (NIFEREX) 150 MG capsule Take 1 capsule (150 mg total) by mouth daily.  Marland Kitchen LEVEMIR 100 UNIT/ML injection Inject 0.12 mLs (12 Units total) into the skin daily.  Marland Kitchen nystatin cream (MYCOSTATIN) Apply topically 2 (two) times daily.  . pantoprazole (PROTONIX) 40 MG tablet Take 1 tablet (40 mg total) by mouth 2 (two) times daily.  . potassium chloride (K-DUR) 10 MEQ tablet Take by mouth.  . pravastatin (PRAVACHOL) 10 MG tablet Take 10 mg by mouth at bedtime.  . tamsulosin (FLOMAX) 0.4 MG CAPS capsule Take 0.4 mg by mouth daily.  Marland Kitchen torsemide (DEMADEX) 20 MG tablet Take 20 mg by mouth daily.   No facility-administered encounter medications on file as of 09/02/2018.     PHYSICAL EXAM:  Deferred  Danai Gotto Z Libby Goehring, NP

## 2018-09-03 DIAGNOSIS — J441 Chronic obstructive pulmonary disease with (acute) exacerbation: Secondary | ICD-10-CM | POA: Diagnosis not present

## 2018-09-10 DIAGNOSIS — I130 Hypertensive heart and chronic kidney disease with heart failure and stage 1 through stage 4 chronic kidney disease, or unspecified chronic kidney disease: Secondary | ICD-10-CM | POA: Diagnosis not present

## 2018-09-10 DIAGNOSIS — N183 Chronic kidney disease, stage 3 (moderate): Secondary | ICD-10-CM | POA: Diagnosis not present

## 2018-09-10 DIAGNOSIS — J9622 Acute and chronic respiratory failure with hypercapnia: Secondary | ICD-10-CM | POA: Diagnosis not present

## 2018-09-10 DIAGNOSIS — K922 Gastrointestinal hemorrhage, unspecified: Secondary | ICD-10-CM | POA: Diagnosis not present

## 2018-09-10 DIAGNOSIS — E1122 Type 2 diabetes mellitus with diabetic chronic kidney disease: Secondary | ICD-10-CM | POA: Diagnosis not present

## 2018-09-10 DIAGNOSIS — J439 Emphysema, unspecified: Secondary | ICD-10-CM | POA: Diagnosis not present

## 2018-09-10 DIAGNOSIS — D62 Acute posthemorrhagic anemia: Secondary | ICD-10-CM | POA: Diagnosis not present

## 2018-09-10 DIAGNOSIS — D500 Iron deficiency anemia secondary to blood loss (chronic): Secondary | ICD-10-CM | POA: Diagnosis not present

## 2018-09-10 DIAGNOSIS — I5033 Acute on chronic diastolic (congestive) heart failure: Secondary | ICD-10-CM | POA: Diagnosis not present

## 2018-09-11 ENCOUNTER — Other Ambulatory Visit: Payer: Self-pay | Admitting: *Deleted

## 2018-09-11 ENCOUNTER — Encounter: Payer: Self-pay | Admitting: *Deleted

## 2018-09-11 NOTE — Patient Outreach (Signed)
Arthur [name] [facility] [place]) Care Management [place] Telephone Outreach Post-hospital discharge day # 31  09/11/2018  DAMONTAE [name] 10-30-1933 [id number]  Successful outgoing telephone call to Arthur [name], daughter/ primary caregiver on [facility] DPR, forWade [name], 85 y/o maleref[place] byinpatient RN CM during recent hospital visit July 4-8, 2020 for acute on chronic respiratory failure with hypoxia secondary to acute on chronic CHF; patient was noted to have (R) pleural effusion and had thoracentesis during hospital visit with 1.2 liters of fluid removed. Patient was discharged home to self-care with ongoing home health services through [place] care.Patient has history including, but not limited to, DM; HTN/ HLD; obesity; COPD, on home O2; anemia of unknown origin; chronic respiratory failure; CKD- III.  Unfortunately patient experienced hospital re-admission 14 days afterprevioushospital discharge, from July 22-24, 2020 for anemia and GI bleeding. Patient was given one unit of PRBC's while hospitalized and was again discharged home to self-care with established home health services in place.  Patient then experienced another hospital admission 11 days after hospital discharge August 5-7, 2020with (R) UQ pain, acute renal failure, UTI.During this admission, patient was noted to have recurrent (R) pleural effusion and underwent thoracentesis with drainage of 600 cc fluid-- this ishospital admission # 6 since May 2020, in patient that had previously never been hospitalized.  HIPAA/ identity verified with caregiver. Pleasant phone call.  Daughter continues to report that patient is "still doing much better" and she denies that patient is in pain.  Caregiver does report that patient had a recent fall, "last week," where patient was getting out of his chair and his foot had fallen asleep, causing him to fall.  Reports no injury with this fall and states that  patient was at home alone at the time of the fall and was able to successfully get himself up from the floor.  States that patient continues using walker for all ambulation.  Previously provided education around fall prevention was reiterated with caregiver and we discussed specific strategies patient could employ to prevent further falls of this nature, including that he pump his feet and ensure they are not asleep when he goes to get up; to move slowly and not rush; possibility of obtaining life-alert system for future fall occurrences.  Other than this newly reported fall, caregiver denies concerns around patient's clinical condition and again states that she believes patient's "spirits continue to be much better," adding that he "seems to be much happier."   Caregiver further reports:  -- no recent provider visits; reviewed upcoming provider visits with caregiver who verbalizes accurate understanding of all scheduled visits and verbalizes plans to transport/ attend all with patient ---- confirms that she attended recent virtual visit for initiation of services with community palliative care team; she confirms that she has contact information for team, and we again discussed role of community palliative care services; caregiver states that she has scheduled next appointment with team, and plans to attend with patient.  Encouraged caregiver to ask any questions around logistics of ongoing follow up with palliative care team, and she verbalizes agreement, stating she will do  -- no changes to medications- caregiver continues to assist with preparing pill box; patient continues taking medications independently once pill box prepared; confirms that patient is currently still only taking torsemide 20 mg po QD and states that his lower extremity swelling "has stayed down;" caregiver denies concerns/ questions around patient's medications   -- home health services for nursing continue for  now- again states  nurse may be discharging patient soon but is unsure; confirms that home health nurse visited patient today  Self-health management of chronic disease states of CHF, DM, and newly diagnosed anemia/ recurrent pleural effusion:  -- continues monitoring and recording daily weights at home: reports patient's breathing/fluid status "is still fine and he is having no problems" and that weight ranges at home "are in his normal range."  Caregiver continues monitoring patient's recorded weights daily when she visits patient. Continues using home O2; caregiver regularly checks patient's SaO2 levels on a daily basis and as needed and reports several recent incidents when patient had his home oxygen off, and she randomly checked his SaO2, noting that it was "at 95% on room air."  Caregiver remains able to verbalize signs/ symptoms yellow CHF zone/ weight gain guidelines along with corresponding action plan for same.  Reports that she believes patient "has stayed in green zone" since last [facility] RN CM outreach.   -- no recent hypo/ hyper- glycemic episodes; states this has essentially resolved, although she reports "one" isolated low blood sugar of "60" when patient checked his own sugar and then contacted caregiver by phone.  Caregiver was at work at the time and was able to ensure that patient had eaten and that his blood sugar increased into the "low 100's" after doing so.  Continues to report fasting blood sugars between "95-low to mid 100's" and post-prandial blood sugars "between 120-180;" continues able to verbalize accurate and appropriate action plan for both hypo/hyper- glycemia.   Caregiver continues reviewing all blood sugars at home when she visits after working.  Confirms that she has continued to prepare patient's insulin syringes for him so they are ready for his use if administration is indicated.  Continues able to verbalize parameters to not take insulin for blood sugars below 100.  -- using teach  back method, again reiterated with caregiver signs/ symptoms and corresponding action plan for acute symptomatic anemia/ GI bleeding- caregiver is able to independently verbalize signs/ symptoms along with action plan, and she denies that patient has had recent signs/ symptoms.  -- continues to deny community resource needs stating that her extended family "all pitch in" to provide care and supervision for patient as indicated; family continues to prepare all meals for patient, which caregiver reports "is working well;" she denies concerns around patient's appetitie today and states that patient "is eating very well."  Caregiverdenies further issues, concerns, or problems today. Iconfirmed thatshehasmy direct phone number, the main [facility] CM office phone number, and the [facility] CM 24-hour nurse advice phone number should issues arise prior to next scheduled [place] month, possibly for case closure, now that the community palliative care team is active in patient's care.  Encouragedcaregiverto contact me directly if needs, questions, issues, or concerns arise prior to next scheduled outreach;sheagreed to do so.  Plan:  Patient will take medications as prescribed and will attend all scheduled provider appointments  Patient/ caregiver will promptly notify care providers for any new concerns/ issues/ problems that arise  Patient will continue to actively participate in home health services as ordered post-hospital discharge  Patient will continue monitoring/recording daily blood sugars 4 times per day  Patient willcontinuedaily weight monitoring and recording at home  [place] outreach to continue with scheduled phonecall tocaregivernext month, possibly for case closure now that Community Palliative care team is actively involved in patient's care  [facility] CM Care Plan Problem One  Most Recent Value  Care Plan Problem One  High Risk for hospital  readmission, related to/ as evidenced by multiple recent hospitalizations for multiple chronic conditions  Role Documenting the Problem One  Care Management [place] for Problem One  Not Active  [facility] Long Term Goal   Over the next 31 days, patient will not experience hospital readmission, as evidenced by patient/ caregiver reporting and review of EMR during [facility] RN CM outreach  [facility] Long Term Goal Start Date  08/12/18  [facility] Long Term Goal Met Date  09/11/18 [facility] Met]  Interventions for Problem One Long Term Goal  Confirmed with caregiver that patient has not experienced hospital readmission since his last hospital discharge,  discussed with caregiver patient's current clinical condition and confirmed that caregiver has not current clinical concerns,  encouraged caregiver to promptly notify care providers for any new concerns that arise and confirmed that she has all provider contact information    [facility] CM Care Plan Problem Two     Most Recent Value  Care Plan Problem Two  Ongoing management of multiple chronic disease states, including CHF/ DM/ anemia in patient that is a high fall risk, as evidenced by caregiver reporting  Role Documenting the Problem Two  Care Management Coordinator  Care Plan for Problem Two  Active  Interventions for Problem Two Long Term Goal   Discussed currentl clinical condition with caregiver and confirmed that she has no current clinical concerns,  discussed patient's medications and confirmed that there have been no recent changes to medications.  using teachback method, reiterated previously provided education around value of daily monitoring at home for CHF and DM and reviewed recently recorded daily weights and blood sugars with caregiver,  reviewed plan of action for weight gain and hypoglycemia and encouraged caregiver to continue monitoring these values at home  [place] Term Goal  Over the next 31 days, patient/ caregiver will promptly notify care providers  for any new concerns that arise, as evidenced by caregiver reporting and collaboration with care providers as indicated during [facility] RN CM outreach  [facility] Long Term Goal Start Date  09/11/18  [facility] CM Short Term Goal #1   Over the next 30 days, patient will not experience any falls, as evidenced by caregiver reporting during [facility] RN CM outreach  [facility] CM Short Term Goal #1 Start Date  09/11/18  Interventions for Short Term Goal #2   Discussed with caregiver patient's recent new fall and reiterated previously provided educatin around fall risks/ prevention,  discussed specific strategies that patient and caregiver can take to prevent falls at home in patient that lives alone and is a high fall risk    [facility] CM Care Plan Problem Three     Most Recent Value  Care Plan Problem Three  Need for ongoing follow up of newly diagnosed anemia/ GI bleeding related to/ as evidenced by multiple recent hospitalizations for anemia  Role Documenting the Problem Three  Care Management Coordinator  Care Plan for Problem Three  Not Active  [facility] Long Term Goal   Over the next 45 days, patient/ caregiver will verbalize ongoing plan for management of patient's newly diagnosed anemia, as evidenced by caregiver reporting during [facility] RN CM outreach   [facility] [place] [place] Long Term Goal Start Date  07/29/18  [facility] Long Term Goal Met Date  09/11/18 [facility] Met]  Interventions for Problem Three Long Term Goal  Confirmed with caregiver that patient has fel much better after receiving recommended  treatment for anemia,  confirmed that caregiver will transport/ attend with patient upcoming and ongoing scheduled office visits for ongoing therapy,  reiterated with caregiver signs/ symptoms anemia along with corresponding action plan  [facility] CM Short Term Goal #1   Over the next 30 days, caregiver will verbalize plan of care at home around recently placed referral for community palliative care, as evidenced by caregiver reporting during [facility] RN CM outreach  [facility]  CM Short Term Goal #1 Start Date  08/20/18  [facility] CM Short Term Goal #1 Met Date  09/11/18 [Goal Met]  Interventions for Short Term Goal #1  Confirmed with caregiver that patient is now active with community palliative care program,  reiterated with caregiver role of palliative care services,  confirmed that caregiver has contact information for palliative care team and has scheduled next palliative care visit with community team     Oneta Rack, RN, BSN, [place] Coordinator [facility] Care Management  [phone]

## 2018-09-12 ENCOUNTER — Ambulatory Visit: Payer: Self-pay | Admitting: *Deleted

## 2018-09-16 DIAGNOSIS — E1122 Type 2 diabetes mellitus with diabetic chronic kidney disease: Secondary | ICD-10-CM | POA: Diagnosis not present

## 2018-09-16 DIAGNOSIS — I130 Hypertensive heart and chronic kidney disease with heart failure and stage 1 through stage 4 chronic kidney disease, or unspecified chronic kidney disease: Secondary | ICD-10-CM | POA: Diagnosis not present

## 2018-09-16 DIAGNOSIS — D62 Acute posthemorrhagic anemia: Secondary | ICD-10-CM | POA: Diagnosis not present

## 2018-09-16 DIAGNOSIS — K922 Gastrointestinal hemorrhage, unspecified: Secondary | ICD-10-CM | POA: Diagnosis not present

## 2018-09-16 DIAGNOSIS — Z125 Encounter for screening for malignant neoplasm of prostate: Secondary | ICD-10-CM | POA: Diagnosis not present

## 2018-09-16 DIAGNOSIS — N401 Enlarged prostate with lower urinary tract symptoms: Secondary | ICD-10-CM | POA: Diagnosis not present

## 2018-09-16 DIAGNOSIS — J439 Emphysema, unspecified: Secondary | ICD-10-CM | POA: Diagnosis not present

## 2018-09-16 DIAGNOSIS — D500 Iron deficiency anemia secondary to blood loss (chronic): Secondary | ICD-10-CM | POA: Diagnosis not present

## 2018-09-16 DIAGNOSIS — J9622 Acute and chronic respiratory failure with hypercapnia: Secondary | ICD-10-CM | POA: Diagnosis not present

## 2018-09-16 DIAGNOSIS — N39498 Other specified urinary incontinence: Secondary | ICD-10-CM | POA: Diagnosis not present

## 2018-09-16 DIAGNOSIS — N183 Chronic kidney disease, stage 3 (moderate): Secondary | ICD-10-CM | POA: Diagnosis not present

## 2018-09-16 DIAGNOSIS — I5033 Acute on chronic diastolic (congestive) heart failure: Secondary | ICD-10-CM | POA: Diagnosis not present

## 2018-09-16 DIAGNOSIS — N3941 Urge incontinence: Secondary | ICD-10-CM | POA: Diagnosis not present

## 2018-09-19 ENCOUNTER — Other Ambulatory Visit: Payer: Self-pay

## 2018-09-19 ENCOUNTER — Encounter: Payer: Self-pay | Admitting: Nurse Practitioner

## 2018-09-19 ENCOUNTER — Other Ambulatory Visit: Payer: Medicare HMO | Admitting: Nurse Practitioner

## 2018-09-19 DIAGNOSIS — Z515 Encounter for palliative care: Secondary | ICD-10-CM

## 2018-09-19 NOTE — Progress Notes (Signed)
[place] Consult Note Telephone: [phone]  Fax: [phone]  PATIENT NAME: Arthur [name] DOB: [birth year] MRN: [id number]  PRIMARY CARE PROVIDER:   Baxter Hire, MD  REFERRING PROVIDER:  Baxter Hire, MD [place],  [place] [address]  RESPONSIBLE PARTY:   Arthur [name] daughter [phone]  Due to the COVID-19 crisis, this visit was done via telemedicine from my office and it was initiated and consent by this patient and or family.  RECOMMENDATIONS and PLAN:  1. ACP: DNR/ MOST form to be discussed at next [facility] visit.   2. Dyspneic secondary to COPD/CHF; remain stable at present time. Continue O2, inhalers, inhalation therapy as needed.   3. Weakness improving, continue to encourage restorative exercises  4. Palliative care encounter Palliative medicine team will continue to support patient, patient's family, and medical team. Visit consisted of counseling and education dealing with the complex and emotionally intense issues of symptom management and palliative care in the setting of serious and potentially life-threatening illness  I spent 45 minutes providing this consultation,  from 4:30pm to 5:15pm. More than 50% of the time in this consultation was spent coordinating communication.   HISTORY OF PRESENT ILLNESS:  Arthur [name] is a 85 y.o. year old male with multiple medical problems including COPD, congestive heart failure, anemia of chronic disease, chronic kidney disease stage 3, diabetes, hypertension, hyperlipidemia, history of GI bleed, allergies, tobacco use, obesity. Hospitalized 5 / 20 / 2020 to 5 / 26 / 2020 for shortness of breath with work of significant for acute hypoxic respiratory failure in the setting of pneumonia with COPD exacerbation requiring oxygen and nebulizers at discharge. He was treated with antibiotic therapy. He does have feet fungus with ID recommending antifungal cream. He  discharged home. He was rehab supplies 6 / 7 / 2020 to 6 / 12 / 2020 due to weakness found a half hemoglobin 3.6 on admission with GI bleed likely lower with persistent rectal bleeding. CT abdomen and pelvis 1.8 cm gallstone. EGD 6 / 8 / 2020 no significant finding or source of bleeding. Colonoscopy 6 / 11 / 2020 no active bleed. Severe anemia required for units to be transfused. Bilateral lower extremity cellulitis with Group B strep bacteremia improved with negative ultrasound for DVT. He did remain on to lead with inhalers and nebulizer treatments. He was discharged home with PT/OT/RN/aid/ Respiratory Care / Education officer, museum. Hospitalize 6 / 25 / 2020 to 7 / 3 / 2020 for severe anemia hemoglobin 5.8 with recurrent cellulitis. 3 hospitalized 7 / 4 / 2020 to 7/8 / 2020 for acute on chronic hypoxic respiratory failure due to acute on chronic diastolic congestive heart failure with right-sided pleural effusions s/p thoracentesis. Healthcare-associated pneumonia. Hospitalize 7 / 22 / 2020 to 7 / 24 / 2020 for acute on chronic anemia with hemoglobin 6.6 requiring transfusion. GI did consult but no further work-up at that time. Unclear etiology. Hospitalize 8 / 5 / 2020 to 8/7 / 2020 for right upper quadrant pain with recurrent right pleural effusion s/p thoracentesis with 600cc strained. UTI requiring antibiotic therapy. Acute on chronic kidney disease stage 3.8 / 21 / 2020 Labs noted that Arthur [name] did have a UTI and was given Levaquin. He followed up with [name] 8 / 25 / 2020 for anemia of chronic renal failure stage 4 severe. CFR 15 to 29. Anemia is most likely secondary to chronic kidney disease though underlying bone marrow disorders  cannot be fully excluded recommend starting erythropoietin therapy. He has since had an appointment with his urologist for recurrent urinary tract infections. He does continue to live at home with his daughter Arthur [name]. I called Arthur [name], Arthur. [name] daughter for scheduled telemedicine /  telephonic follow-up palliative care visit. We talked about purpose of follow-up visit and Arthur [name] and agreement. We talked about how Arthur Varnell has been doing. Arthur [name] endorses that he continues to do better functionally. Arthur [name] endorses he is walking although he did have one fall several weeks ago that he had a hematoma on his leg which has improved. We talked about walking with his walker. Cristie Hem endorses that he had a recent appointment at the urologist of which he was able to walk from the car to the appointment. He was fatigued and tired but he was able to do that. He does remain on continuous oxygen though at times he takes it off during the day. We talked about symptoms of shortness of breath which is relieved with rest and oxygen. No hospitalization since last palliative care visit. We talked about his appetite which is good. We talked about symptoms of pain which he does not experience. We talked about home health as therapy has been completed last week. We talked about home health nursing through [facility] that unknown discharge day as of yet. We talked about conversation concerning option of Hospice Services once Arthur [name] is completed due to six hospitalizations in six months and advanced COPD. Arthur [name] endorses that she still wants to continue [facility] as long as they're able to provide services. She was going to call and check to see how much longer they were going to be able to do their visit. We talked about medical goals of care and wishes are the treat what is treatable. He did Dr Tasia Catchings for anemia for which he has been receiving injection. Hemoglobin has been stable and he has been feeling better. We talked about medical goals of care including aggressive versus conservative versus Comfort Care. Arthur [name] endorses at present time he is doing well and would like to continue with palliative Services rather than revisit Hospice Services. Arthur [name] is familiar with Hospice Services as her mom recently passed under  hospice from same disease progression as Arthur [name]. We talked about next follow-up palliative care visit, schedule. Therapeutic listening and emotional support provided. Discuss with Arthur [name] that she could call it anytime should she change her mind about hospice. Questions answered satisfaction. Contact information. Palliative Care was asked to help to continue to address goals of care.   CODE STATUS: DNR  PPS: 40% HOSPICE ELIGIBILITY/DIAGNOSIS: TBD  PAST MEDICAL HISTORY:  Past Medical History:  Diagnosis Date  . Anemia of chronic kidney failure 08/21/2018  . CHF (congestive heart failure) ([place])   . CKD (chronic kidney disease), stage III ([place])   . COPD (chronic obstructive pulmonary disease) ([place])   . Diabetes mellitus without complication ([place])   . Hyperlipidemia   . Hypertension     SOCIAL HX:  Social History   Tobacco Use  . Smoking status: Former Smoker    Packs/day: 2.00    Years: 35.00    Pack years: 70.00    Types: Cigarettes    Quit date: 11/04/1982    Years since quitting: 35.8  . Smokeless tobacco: Current User  Substance Use Topics  . Alcohol use: Not Currently    ALLERGIES:  Allergies  Allergen Reactions  . Codeine Other (See Comments)    Constipation  PERTINENT MEDICATIONS:  Outpatient Encounter Medications as of 09/19/2018  Medication Sig  . albuterol (VENTOLIN HFA) 108 (90 Base) MCG/ACT inhaler Inhale 2 puffs into the lungs every 6 (six) hours as needed for wheezing or shortness of breath.  [place] Kitchen amLODipine (NORVASC) 2.5 MG tablet Take 2.5 mg by mouth daily.  Mar[place] Kitchen docusate sodium (COLACE) 100 MG capsule Take by mouth.  Mar[place] Kitchen ipratropium-albuterol (DUONEB) 0.5-2.5 (3) MG/3ML SOLN Take 3 mLs by nebulization every 6 (six) hours as needed.  . iron polysaccharides (NIFEREX) 150 MG capsule Take 1 capsule (150 mg total) by mouth daily.  Mar[place] Kitchen LEVEMIR 100 UNIT/ML injection Inject 0.12 mLs (12 Units total) into the skin daily.  Mar[place] Kitchen nystatin cream (MYCOSTATIN) Apply topically 2  (two) times daily.  . pantoprazole (PROTONIX) 40 MG tablet Take 1 tablet (40 mg total) by mouth 2 (two) times daily.  . potassium chloride (K-DUR) 10 MEQ tablet Take by mouth.  . pravastatin (PRAVACHOL) 10 MG tablet Take 10 mg by mouth at bedtime.  . tamsulosin (FLOMAX) 0.4 MG CAPS capsule Take 0.4 mg by mouth daily.  Marland Kitchen torsemide (DEMADEX) 20 MG tablet Take 20 mg by mouth daily.   No facility-administered encounter medications on file as of 09/19/2018.     PHYSICAL EXAM:   Locke Barrell Z Justeen Hehr, NP

## 2018-09-23 ENCOUNTER — Encounter: Payer: Self-pay | Admitting: Oncology

## 2018-09-23 ENCOUNTER — Other Ambulatory Visit: Payer: Self-pay

## 2018-09-23 DIAGNOSIS — K922 Gastrointestinal hemorrhage, unspecified: Secondary | ICD-10-CM | POA: Diagnosis not present

## 2018-09-23 DIAGNOSIS — E1122 Type 2 diabetes mellitus with diabetic chronic kidney disease: Secondary | ICD-10-CM | POA: Diagnosis not present

## 2018-09-23 DIAGNOSIS — D500 Iron deficiency anemia secondary to blood loss (chronic): Secondary | ICD-10-CM | POA: Diagnosis not present

## 2018-09-23 DIAGNOSIS — I5033 Acute on chronic diastolic (congestive) heart failure: Secondary | ICD-10-CM | POA: Diagnosis not present

## 2018-09-23 DIAGNOSIS — J439 Emphysema, unspecified: Secondary | ICD-10-CM | POA: Diagnosis not present

## 2018-09-23 DIAGNOSIS — I130 Hypertensive heart and chronic kidney disease with heart failure and stage 1 through stage 4 chronic kidney disease, or unspecified chronic kidney disease: Secondary | ICD-10-CM | POA: Diagnosis not present

## 2018-09-23 DIAGNOSIS — J9622 Acute and chronic respiratory failure with hypercapnia: Secondary | ICD-10-CM | POA: Diagnosis not present

## 2018-09-23 DIAGNOSIS — D62 Acute posthemorrhagic anemia: Secondary | ICD-10-CM | POA: Diagnosis not present

## 2018-09-23 DIAGNOSIS — N183 Chronic kidney disease, stage 3 (moderate): Secondary | ICD-10-CM | POA: Diagnosis not present

## 2018-09-23 NOTE — Progress Notes (Signed)
Patient unable to come to the phone per daughter, she was able to provide information for screening. Patient has been prescribed and antibiotic which was not reflected in his medication list. Daughter stated she will bring it tomorrow. Patient has also fallen this past month with no significant injuries.

## 2018-09-24 ENCOUNTER — Other Ambulatory Visit: Payer: Self-pay

## 2018-09-24 ENCOUNTER — Inpatient Hospital Stay (HOSPITAL_BASED_OUTPATIENT_CLINIC_OR_DEPARTMENT_OTHER): Payer: Medicare HMO | Admitting: Oncology

## 2018-09-24 ENCOUNTER — Inpatient Hospital Stay: Payer: Medicare HMO | Attending: Oncology

## 2018-09-24 ENCOUNTER — Inpatient Hospital Stay: Payer: Medicare HMO

## 2018-09-24 VITALS — BP 128/53 | HR 84 | Temp 98.3°F | Resp 20 | Wt 223.7 lb

## 2018-09-24 DIAGNOSIS — D631 Anemia in chronic kidney disease: Secondary | ICD-10-CM | POA: Diagnosis not present

## 2018-09-24 DIAGNOSIS — I509 Heart failure, unspecified: Secondary | ICD-10-CM | POA: Diagnosis not present

## 2018-09-24 DIAGNOSIS — E119 Type 2 diabetes mellitus without complications: Secondary | ICD-10-CM | POA: Diagnosis not present

## 2018-09-24 DIAGNOSIS — N183 Chronic kidney disease, stage 3 (moderate): Secondary | ICD-10-CM | POA: Insufficient documentation

## 2018-09-24 DIAGNOSIS — Z79899 Other long term (current) drug therapy: Secondary | ICD-10-CM | POA: Diagnosis not present

## 2018-09-24 DIAGNOSIS — I129 Hypertensive chronic kidney disease with stage 1 through stage 4 chronic kidney disease, or unspecified chronic kidney disease: Secondary | ICD-10-CM | POA: Insufficient documentation

## 2018-09-24 DIAGNOSIS — J449 Chronic obstructive pulmonary disease, unspecified: Secondary | ICD-10-CM | POA: Diagnosis not present

## 2018-09-24 DIAGNOSIS — Z87891 Personal history of nicotine dependence: Secondary | ICD-10-CM | POA: Diagnosis not present

## 2018-09-24 DIAGNOSIS — E785 Hyperlipidemia, unspecified: Secondary | ICD-10-CM | POA: Diagnosis not present

## 2018-09-24 DIAGNOSIS — N189 Chronic kidney disease, unspecified: Secondary | ICD-10-CM | POA: Diagnosis not present

## 2018-09-24 DIAGNOSIS — I10 Essential (primary) hypertension: Secondary | ICD-10-CM | POA: Diagnosis not present

## 2018-09-24 DIAGNOSIS — N184 Chronic kidney disease, stage 4 (severe): Secondary | ICD-10-CM | POA: Diagnosis not present

## 2018-09-24 LAB — CBC WITH DIFFERENTIAL/PLATELET
Abs Immature Granulocytes: 0.04 10*3/uL (ref 0.00–0.07)
Basophils Absolute: 0.1 10*3/uL (ref 0.0–0.1)
Basophils Relative: 1 %
Eosinophils Absolute: 0.4 10*3/uL (ref 0.0–0.5)
Eosinophils Relative: 5 %
HCT: 25.6 % — ABNORMAL LOW (ref 39.0–52.0)
Hemoglobin: 8.2 g/dL — ABNORMAL LOW (ref 13.0–17.0)
Immature Granulocytes: 1 %
Lymphocytes Relative: 23 %
Lymphs Abs: 1.9 10*3/uL (ref 0.7–4.0)
MCH: 29.8 pg (ref 26.0–34.0)
MCHC: 32 g/dL (ref 30.0–36.0)
MCV: 93.1 fL (ref 80.0–100.0)
Monocytes Absolute: 0.8 10*3/uL (ref 0.1–1.0)
Monocytes Relative: 9 %
Neutro Abs: 5.3 10*3/uL (ref 1.7–7.7)
Neutrophils Relative %: 61 %
Platelets: 274 10*3/uL (ref 150–400)
RBC: 2.75 MIL/uL — ABNORMAL LOW (ref 4.22–5.81)
RDW: 19.3 % — ABNORMAL HIGH (ref 11.5–15.5)
WBC: 8.5 10*3/uL (ref 4.0–10.5)
nRBC: 0.4 % — ABNORMAL HIGH (ref 0.0–0.2)

## 2018-09-24 MED ORDER — EPOETIN ALFA-EPBX 40000 UNIT/ML IJ SOLN
40000.0000 [IU] | Freq: Once | INTRAMUSCULAR | Status: AC
  Administered 2018-09-24: 15:00:00 40000 [IU] via SUBCUTANEOUS

## 2018-09-24 MED ORDER — EPOETIN ALFA-EPBX 10000 UNIT/ML IJ SOLN
10000.0000 [IU] | Freq: Once | INTRAMUSCULAR | Status: AC
  Administered 2018-09-24: 15:00:00 10000 [IU] via SUBCUTANEOUS

## 2018-09-24 MED ORDER — EPOETIN ALFA-EPBX 40000 UNIT/ML IJ SOLN
50000.0000 [IU] | Freq: Once | INTRAMUSCULAR | Status: DC
  Filled 2018-09-24: qty 1.25

## 2018-09-25 NOTE — Progress Notes (Signed)
Hematology/Oncology  [place] [phone] [phone]   Patient Care Team: Baxter Hire, MD as PCP - General (Internal Medicine) Knox Royalty, RN as [place] Management  REFERRING PROVIDER: Baxter Hire, MD  CHIEF COMPLAINTS/REASON FOR VISIT:  Evaluation of anemia  HISTORY OF PRESENTING ILLNESS:   Arthur [name] is a  85 y.o.  male with PMH listed below was seen in consultation at the request of  Baxter Hire, MD  for evaluation of anemia Patient has chronic comorbidities including diabetes, COPD/emphysema, chronic respiratory failure on home oxygen, CKD stage III, CHF, hypertension and hyperlipidemia. Reviewed patient's previous lab records. Extensive medical records review was performed by me. Anemia has been chronic, dated back to at least 2016.  At that time his hemoglobin was around 11-12. Hemoglobin started to trended down below 10 starting February 2020. 02/11/2018 hemoglobin 9, hematocrit 28.6, 06/17/2018 hemoglobin 7.3 hematocrit of 24, 06/27/2018 hemoglobin 5.8 07/15/2018 hemoglobin 7.5 07/29/2018 hemoglobin 8.2, 08/12/2018, hemoglobin 9.2  Patient reports feeling tired and weak.  Chronic shortness of breath at baseline today.  Daughter [name] was called during the clinical encounter and she was able to listen to the discussion and participate as well.   INTERVAL HISTORY Arthur [name] is a 85 y.o. male who has above history reviewed by me today presents for follow up visit for management of anemia Problems and complaints are listed below: Patient complained that he has been waiting for half an hour.  He asked what is taking so long. His appointment was scheduled at 2:15 and I was with patient around 2: 25.  Apologized to patient for the delay.   He reports feeling well at baseline.  Per patient's request, I called patient's daughter [name].  Patient reports chronic fatigue is at baseline.  Not  worse. Has poor circulation that sometimes when he check his blood sugar, he cannot get blood for the testing. Hands are cold. Chronic shortness of breath, breathing is baseline. Denies any fever, chills, nausea, vomiting.    Review of Systems  Constitutional: Positive for fatigue. Negative for appetite change, chills, fever and unexpected weight change.  HENT:   Negative for hearing loss and voice change.   Eyes: Negative for eye problems and icterus.  Respiratory: Positive for shortness of breath. Negative for chest tightness and cough.   Cardiovascular: Negative for chest pain and leg swelling.  Gastrointestinal: Negative for abdominal distention and abdominal pain.  Endocrine: Negative for hot flashes.  Genitourinary: Negative for difficulty urinating, dysuria and frequency.   Musculoskeletal: Negative for arthralgias.  Skin: Negative for itching and rash.  Neurological: Negative for light-headedness and numbness.  Hematological: Negative for adenopathy. Does not bruise/bleed easily.  Psychiatric/Behavioral: Negative for confusion.    MEDICAL HISTORY:  Past Medical History:  Diagnosis Date  . Anemia of chronic kidney failure 08/21/2018  . CHF (congestive heart failure) ([place])   . CKD (chronic kidney disease), stage III ([place])   . COPD (chronic obstructive pulmonary disease) ([place])   . Diabetes mellitus without complication ([place])   . Hyperlipidemia   . Hypertension     SURGICAL HISTORY: Past Surgical History:  Procedure Laterality Date  . COLONOSCOPY WITH PROPOFOL N/A 06/11/2018   Procedure: COLONOSCOPY WITH PROPOFOL;  Surgeon: Jonathon Bellows, MD;  Location: [facility] ENDOSCOPY;  Service: Gastroenterology;  Laterality: N/A;  . COLONOSCOPY WITH PROPOFOL N/A 06/13/2018   Procedure: COLONOSCOPY WITH PROPOFOL;  Surgeon: Jonathon Bellows, MD;  Location: [facility] ENDOSCOPY;  Service: Gastroenterology;  Laterality: N/A;  . ESOPHAGOGASTRODUODENOSCOPY Left 06/10/2018   Procedure:  ESOPHAGOGASTRODUODENOSCOPY (EGD);  Surgeon: Jonathon Bellows, MD;  Location[facility] ENDOSCOPY;  Service: Gastroenterology;  Laterality: Left;  . GIVENS CAPSULE STUDY N/A 06/28/2018   Procedure: GIVENS CAPSULE STUDY;  Surgeon: Lucilla Lame, MD;  Location: [facility] ENDOSCOPY;  Service: Endoscopy;  Laterality: N/A;  . GIVENS CAPSULE STUDY N/A 06/30/2018   Procedure: GIVENS CAPSULE STUDY;  Surgeon: Jonathon Bellows, MD;  Location: [facility] ENDOSCOPY;  Service: Gastroenterology;  Laterality: N/A;    SOCIAL HISTORY: Social History   Socioeconomic History  . Marital status: Widowed    Spouse name: Not on file  . Number of children: Not on file  . Years of education: Not on file  . Highest education level: Not on file  Occupational History  . Not on file  Social Needs  . Financial resource strain: Not hard at all  . Food insecurity    Worry: Never true    Inability: Never true  . Transportation needs    Medical: No    Non-medical: No  Tobacco Use  . Smoking status: Former Smoker    Packs/day: 2.00    Years: 35.00    Pack years: 70.00    Types: Cigarettes    Quit date: 11/04/1982    Years since quitting: 35.9  . Smokeless tobacco: Current User  Substance and Sexual Activity  . Alcohol use: Not Currently  . Drug use: No  . Sexual activity: Not Currently  Lifestyle  . Physical activity    Days per week: 0 days    Minutes per session: Not on file  . Stress: Not at all  Relationships  . Social Herbalist on phone: Not on file    Gets together: Not on file    Attends religious service: Not on file    Active member of club or organization: Not on file    Attends meetings of clubs or organizations: Not on file    Relationship status: Not on file  . Intimate partner violence    Fear of current or ex partner: Patient refused    Emotionally abused: Patient refused    Physically abused: Patient refused    Forced sexual activity: Patient refused  Other Topics Concern  . Not on file  Social  History Narrative  . Not on file    FAMILY HISTORY: Family History  Problem Relation Age of Onset  . COPD Mother   . Cancer Father     ALLERGIES:  is allergic to codeine.  MEDICATIONS:  Current Outpatient Medications  Medication Sig Dispense Refill  . albuterol (VENTOLIN HFA) 108 (90 Base) MCG/ACT inhaler Inhale 2 puffs into the lungs every 6 (six) hours as needed for wheezing or shortness of breath. 1 Inhaler 2  . amLODipine (NORVASC) 2.5 MG tablet Take 2.5 mg by mouth daily.    Marland Kitchen docusate sodium (COLACE) 100 MG capsule Take by mouth.    Marland Kitchen ipratropium-albuterol (DUONEB) 0.5-2.5 (3) MG/3ML SOLN Take 3 mLs by nebulization every 6 (six) hours as needed. 360 mL 0  . iron polysaccharides (NIFEREX) 150 MG capsule Take 1 capsule (150 mg total) by mouth daily. 30 capsule 0  . LEVEMIR 100 UNIT/ML injection Inject 0.12 mLs (12 Units total) into the skin daily. 10 mL 0  . nystatin cream (MYCOSTATIN) Apply topically 2 (two) times daily. 30 g 0  . pantoprazole (PROTONIX) 40 MG tablet Take 1 tablet (40 mg total) by mouth 2 (two) times daily. [place]  tablet 0  . potassium chloride (K-DUR) 10 MEQ tablet Take by mouth.    . pravastatin (PRAVACHOL) 10 MG tablet Take 10 mg by mouth at bedtime.    . sulfamethoxazole-trimethoprim (BACTRIM DS) 800-160 MG tablet Take 1 tablet by mouth 2 (two) times daily.    . tamsulosin (FLOMAX) 0.4 MG CAPS capsule Take 0.4 mg by mouth daily.    Marland Kitchen torsemide (DEMADEX) 20 MG tablet Take 20 mg by mouth daily.     No current facility-administered medications for this visit.      PHYSICAL EXAMINATION: ECOG PERFORMANCE STATUS: 2 - Symptomatic, <50% confined to bed Vitals:   09/24/18 1416  BP: (!) 128/53  Pulse: 84  Resp: 20  Temp: 98.3 F (36.8 C)   Filed Weights   09/24/18 1416  Weight: 223 lb 11.2 oz (101.5 kg)    Physical Exam Constitutional:      General: He is not in acute distress.    Appearance: He is ill-appearing.  HENT:     Head: Normocephalic and  atraumatic.  Eyes:     General: No scleral icterus.    Pupils: Pupils are equal, round, and reactive to light.  Neck:     Musculoskeletal: Normal range of motion and neck supple.  Cardiovascular:     Rate and Rhythm: Normal rate and regular rhythm.     Heart sounds: Normal heart sounds.  Pulmonary:     Effort: Pulmonary effort is normal. No respiratory distress.     Breath sounds: No wheezing.     Comments: Breathes via nasal cannula oxygen. Bibasilar crackles. Abdominal:     General: Bowel sounds are normal. There is no distension.     Palpations: Abdomen is soft. There is no mass.     Tenderness: There is no abdominal tenderness.  Musculoskeletal: Normal range of motion.        General: No deformity.  Skin:    General: Skin is warm and dry.     Findings: No erythema or rash.  Neurological:     Mental Status: He is alert and oriented to person, place, and time.     Cranial Nerves: No cranial nerve deficit.     Coordination: Coordination normal.  Psychiatric:        Mood and Affect: Mood normal.        Behavior: Behavior normal.        Thought Content: Thought content normal.     LABORATORY DATA:  I have reviewed the data as listed Lab Results  Component Value Date   WBC 8.5 09/24/2018   HGB 8.2 (L) 09/24/2018   HCT 25.6 (L) 09/24/2018   MCV 93.1 09/24/2018   PLT 274 09/24/2018   Recent Labs    07/06/18 1613  07/15/18 1544  08/07/18 1818 08/08/18 0542 08/09/18 0510  NA 139   < > 140   < > 133* 134* 135  K 3.5   < > 3.7   < > 3.3* 3.7 3.2*  CL 95*   < > 95*   < > 89* 90* 92*  CO2 36*   < > 33*   < > 32 30 32  GLUCOSE 131*   < > 137*   < > 199* 190* 110*  BUN 31*   < > 30*   < > 45* 41* 47*  CREATININE 1.96*   < > 2.03*   < > 2.48* 2.25* 2.64*  CALCIUM 8.0*   < > 7.6*   < > 8.7*  8.6* 8.4*  GFRNONAA 31*   < > 29*   < > 23* 26* 21*  GFRAA 36*   < > 34*   < > 27* 30* 25*  PROT 6.0*  --  5.7*  --  8.0  --   --   ALBUMIN 3.0*  --  3.0*  --  3.8  --   --   AST  14*  --  20  --  14*  --   --   ALT 12  --  21  --  12  --   --   ALKPHOS 49  --  57  --  65  --   --   BILITOT 0.8  --  0.3  --  0.9  --   --    < > = values in this interval not displayed.   Iron/TIBC/Ferritin/ %Sat    Component Value Date/Time   IRON 36 (L) 07/15/2018 1544   TIBC 172 (L) 07/15/2018 1544   FERRITIN 535 (H) 07/15/2018 1544   IRONPCTSAT 21 07/15/2018 1544      RADIOGRAPHIC STUDIES: I have personally reviewed the radiological images as listed and agreed with the findings in the report.  No results found.    ASSESSMENT & PLAN:  1. Anemia of chronic renal failure, unspecified CKD stage    Labs are reviewed and discussed with patient.  Hemoglobin improved to 8.2.  Patient is status post Retacrit 40,009 4 weeks ago.  Tolerated well.   I will increase dose to 50,000 units today. Iron panel was checked on 07/15/2018 patient has ferritin 535 with TIBC 172. Will check retic panel at the next visit.  Orders Placed This Encounter  Procedures  . CBC with Differential/Platelet    Standing Status:   Future    Standing Expiration Date:   09/24/2019    All questions were answered. The patient knows to call the clinic with any problems questions or concerns.  cc Baxter Hire, MD    Return of visit: 4 weeks.   Earlie Server, MD, PhD Hematology Oncology [facility] at [employer] Pager- [phone] 09/25/2018

## 2018-09-28 DIAGNOSIS — J441 Chronic obstructive pulmonary disease with (acute) exacerbation: Secondary | ICD-10-CM | POA: Diagnosis not present

## 2018-10-01 DIAGNOSIS — R600 Localized edema: Secondary | ICD-10-CM | POA: Diagnosis not present

## 2018-10-01 DIAGNOSIS — E876 Hypokalemia: Secondary | ICD-10-CM | POA: Diagnosis not present

## 2018-10-01 DIAGNOSIS — N184 Chronic kidney disease, stage 4 (severe): Secondary | ICD-10-CM | POA: Diagnosis not present

## 2018-10-01 DIAGNOSIS — Z794 Long term (current) use of insulin: Secondary | ICD-10-CM | POA: Diagnosis not present

## 2018-10-01 DIAGNOSIS — N400 Enlarged prostate without lower urinary tract symptoms: Secondary | ICD-10-CM | POA: Diagnosis not present

## 2018-10-01 DIAGNOSIS — E119 Type 2 diabetes mellitus without complications: Secondary | ICD-10-CM | POA: Diagnosis not present

## 2018-10-02 ENCOUNTER — Other Ambulatory Visit: Payer: Self-pay | Admitting: *Deleted

## 2018-10-02 NOTE — Patient Outreach (Signed)
[name]) Care Management [place] Telephone Outreach- Case Closure: patient active with external CM program Post-hospital discharge day # 52 without unplanned hospital re-admission  10/02/2018  Arthur [name] 02-06-1933 [id number]  Successful outgoing telephone call to Arthur [name], daughter/ primary caregiver on [facility] DPR, forWade [name], 85 y/o malereferred [place] byinpatient RN CM during recent hospital visit July 4-8, 2020 for acute on chronic respiratory failure with hypoxia secondary to acute on chronic CHF; patient was noted to have (R) pleural effusion and had thoracentesis during hospital visit with 1.2 liters of fluid removed. Patient was discharged home to self-care with ongoing home health services through [place] care.Patient has history including, but not limited to, DM; HTN/ HLD; obesity; COPD, on home O2; anemia of unknown origin; chronic respiratory failure; CKD- III.  Unfortunately patient experienced several hospital re-admissions since [facility] CM referral: July 22-24, 2020 for anemia and GI bleeding.  August 5-7, 2020with (R) UQ pain, acute renal failure, UTI. Patient has had 6hospital admissions since May 2020, in patient that had previously never been hospitalized.  HIPAA/ identity verified with caregiver. Pleasant phone call.Daughter continues to report that patient is "still doing great" and she denies that patient is in pain or had new/ recent falls; states that patient has been "so much better" since he started receiving Epogen shots with oncology provider for anemia.  Confirms that patient has not had recent or new hospital admissions since last [place] outreach and denies ocncerns around patient's current clinical condition.  Caregiver further reports:  -- has attended all recent provider appointments- caregiver continues to provide transportation and to attend all appointments along with patient -- verbalizes  accurate understanding of upcoming scheduled appointments -- denies concerns around medications; caregiver continues to manage/ supervise patient's medication management -- home health services have now been completed, as patient met goals -- patient active with community palliative care team and caregiver has contact information and communicates with team regularly -- continues monitoring and recording daily weights, blood sugars and saO2 levels at home: caregiver reports she continues to review all values on a daily basis and that patient is "holding steady' with all recorded values.  Denies recent episodes hypoglycemia; signs/ symptoms GI bleeding; signs/ symptoms yellow CHF zone -- continues using home O2, although reports patient "is needing to use it less" than he previously did, stating "he is so much better;" reports using at night and prn during day -- patient is walking more outside and "tolerating well." -- caregiver continues preparing low-salt heart healthy meals for patient and continues to deny community resource needs  -- caregiver able to independently verbalize signs/ symptoms GI bleeding, hypoglycemia, yellow CHF/ COPD zones along with corresponding action plan; encouraged caregiver to ensure that patient receives flu shot for this upcoming flu season and she verbalizes plans to do so "in October." -- patient affect/ "spirits" remain "high;" reports patient is happy and continues singing at his kitchen table every morning  Caregiverdenies further issues, concerns, or problems today. Discussed with caregiver possibility of [facility] CM case closure, as she confirms that patient is active with community palliative care team, and her reported personal time constraints due to her ongoing work schedule.  Caregiver reports that she feels very comfortable with the community palliative care team and verbalizes agreement with [facility] CM case closure, as she is limited in time to "talk to so many people"  due to her work schedule.  Plan:  Will close [place]  CM program, as caregiver confirms that patient has met his previously established [facility] CM goals, has no further care coordination needs, and is now active with Community Palliative care team, and will make patient's PCP aware of same-- will send PCP case closure letter  [facility] CM Care Plan Problem Two     Most Recent Value  Care Plan Problem Two  Ongoing management of multiple chronic disease states, including CHF/ DM/ anemia in patient that is a high fall risk, as evidenced by caregiver reporting  Role Documenting the Problem Two  Care Management [place] for Problem Two  Not Active  Interventions for Problem Two Long Term Goal   Confirmed with caregiver that she has no current clinical concerns with patient's clinical status and that patient's leg swelling and low blood sugars have resolved,  reviewed recently attended provider appointments with caregiver and confirmed that she continues to provide transportation to and from appointments and attends with patient.  Confirmed that patient is now active with community palliative care team, and that caregiver is able to verbalize appropriate action plan for low blood sugar and for development of signs/ symptoms yellow CHF zone  [facility] Long Term Goal  Over the next 31 days, patient/ caregiver will promptly notify care providers for any new concerns that arise, as evidenced by caregiver reporting and collaboration with care providers as indicated during [facility] RN CM outreach  [facility] Long Term Goal Start Date  09/11/18  [facility] Long Term Goal Met Date  10/02/18 [Goal Met]  [facility] CM Short Term Goal #1   Over the next 30 days, patient will not experience any falls, as evidenced by caregiver reporting during [facility] RN CM outreach  [facility] CM Short Term Goal #1 Start Date  09/11/18  [facility] CM Short Term Goal #1 Met Date   10/02/18 [Goal Met]  Interventions for Short Term Goal #2   Confirmed with caregiver that  patient has not experienced any new/ recent falls, and that patient has been walking more and becoming stronger,  fall risks/ prevention education reinforced     It has been a pleasure caring for Mr/ Starleen Blue,  Oneta Rack, RN, BSN, [place] Coordinator [facility] Care Management  [phone]

## 2018-10-03 DIAGNOSIS — J441 Chronic obstructive pulmonary disease with (acute) exacerbation: Secondary | ICD-10-CM | POA: Diagnosis not present

## 2018-10-07 ENCOUNTER — Ambulatory Visit
Admission: RE | Admit: 2018-10-07 | Discharge: 2018-10-07 | Disposition: A | Discharge status: Home / Self Care | Payer: Medicare HMO | Source: Ambulatory Visit | Attending: Family Medicine | Admitting: Family Medicine

## 2018-10-07 ENCOUNTER — Other Ambulatory Visit: Payer: Self-pay

## 2018-10-07 DIAGNOSIS — R918 Other nonspecific abnormal finding of lung field: Secondary | ICD-10-CM | POA: Diagnosis not present

## 2018-10-07 DIAGNOSIS — R9389 Abnormal findings on diagnostic imaging of other specified body structures: Secondary | ICD-10-CM | POA: Diagnosis not present

## 2018-10-07 DIAGNOSIS — N184 Chronic kidney disease, stage 4 (severe): Secondary | ICD-10-CM | POA: Diagnosis not present

## 2018-10-14 ENCOUNTER — Ambulatory Visit: Payer: Medicare HMO | Admitting: *Deleted

## 2018-10-15 DIAGNOSIS — E1142 Type 2 diabetes mellitus with diabetic polyneuropathy: Secondary | ICD-10-CM | POA: Diagnosis not present

## 2018-10-15 DIAGNOSIS — L851 Acquired keratosis [keratoderma] palmaris et plantaris: Secondary | ICD-10-CM | POA: Diagnosis not present

## 2018-10-15 DIAGNOSIS — B351 Tinea unguium: Secondary | ICD-10-CM | POA: Diagnosis not present

## 2018-10-15 DIAGNOSIS — Z23 Encounter for immunization: Secondary | ICD-10-CM | POA: Diagnosis not present

## 2018-10-21 NOTE — Progress Notes (Signed)
Called daughter no answer, left message

## 2018-10-22 ENCOUNTER — Inpatient Hospital Stay: Payer: Medicare HMO

## 2018-10-22 ENCOUNTER — Ambulatory Visit: Payer: Medicare HMO

## 2018-10-22 ENCOUNTER — Other Ambulatory Visit: Payer: Self-pay

## 2018-10-22 ENCOUNTER — Other Ambulatory Visit: Payer: Medicare HMO

## 2018-10-22 ENCOUNTER — Inpatient Hospital Stay (HOSPITAL_BASED_OUTPATIENT_CLINIC_OR_DEPARTMENT_OTHER): Payer: Medicare HMO | Admitting: Oncology

## 2018-10-22 ENCOUNTER — Inpatient Hospital Stay: Payer: Medicare HMO | Attending: Oncology

## 2018-10-22 ENCOUNTER — Encounter: Payer: Self-pay | Admitting: Oncology

## 2018-10-22 ENCOUNTER — Ambulatory Visit: Payer: Medicare HMO | Admitting: Oncology

## 2018-10-22 VITALS — BP 124/67 | HR 71 | Temp 99.0°F | Resp 18

## 2018-10-22 DIAGNOSIS — R531 Weakness: Secondary | ICD-10-CM | POA: Insufficient documentation

## 2018-10-22 DIAGNOSIS — E785 Hyperlipidemia, unspecified: Secondary | ICD-10-CM | POA: Diagnosis not present

## 2018-10-22 DIAGNOSIS — E119 Type 2 diabetes mellitus without complications: Secondary | ICD-10-CM | POA: Diagnosis not present

## 2018-10-22 DIAGNOSIS — N189 Chronic kidney disease, unspecified: Secondary | ICD-10-CM | POA: Diagnosis not present

## 2018-10-22 DIAGNOSIS — D631 Anemia in chronic kidney disease: Secondary | ICD-10-CM

## 2018-10-22 DIAGNOSIS — I509 Heart failure, unspecified: Secondary | ICD-10-CM | POA: Insufficient documentation

## 2018-10-22 DIAGNOSIS — N184 Chronic kidney disease, stage 4 (severe): Secondary | ICD-10-CM | POA: Diagnosis not present

## 2018-10-22 DIAGNOSIS — I129 Hypertensive chronic kidney disease with stage 1 through stage 4 chronic kidney disease, or unspecified chronic kidney disease: Secondary | ICD-10-CM | POA: Diagnosis not present

## 2018-10-22 DIAGNOSIS — Z79899 Other long term (current) drug therapy: Secondary | ICD-10-CM | POA: Diagnosis not present

## 2018-10-22 DIAGNOSIS — J439 Emphysema, unspecified: Secondary | ICD-10-CM | POA: Insufficient documentation

## 2018-10-22 DIAGNOSIS — I10 Essential (primary) hypertension: Secondary | ICD-10-CM | POA: Insufficient documentation

## 2018-10-22 LAB — CBC WITH DIFFERENTIAL/PLATELET
Abs Immature Granulocytes: 0.03 10*3/uL (ref 0.00–0.07)
Basophils Absolute: 0.1 10*3/uL (ref 0.0–0.1)
Basophils Relative: 1 %
Eosinophils Absolute: 0.6 10*3/uL — ABNORMAL HIGH (ref 0.0–0.5)
Eosinophils Relative: 9 %
HCT: 24.8 % — ABNORMAL LOW (ref 39.0–52.0)
Hemoglobin: 7.8 g/dL — ABNORMAL LOW (ref 13.0–17.0)
Immature Granulocytes: 0 %
Lymphocytes Relative: 25 %
Lymphs Abs: 1.8 10*3/uL (ref 0.7–4.0)
MCH: 30.6 pg (ref 26.0–34.0)
MCHC: 31.5 g/dL (ref 30.0–36.0)
MCV: 97.3 fL (ref 80.0–100.0)
Monocytes Absolute: 0.7 10*3/uL (ref 0.1–1.0)
Monocytes Relative: 9 %
Neutro Abs: 3.9 10*3/uL (ref 1.7–7.7)
Neutrophils Relative %: 56 %
Platelets: 270 10*3/uL (ref 150–400)
RBC: 2.55 MIL/uL — ABNORMAL LOW (ref 4.22–5.81)
RDW: 19.8 % — ABNORMAL HIGH (ref 11.5–15.5)
WBC: 7.1 10*3/uL (ref 4.0–10.5)
nRBC: 0 % (ref 0.0–0.2)

## 2018-10-22 MED ORDER — EPOETIN ALFA-EPBX 10000 UNIT/ML IJ SOLN
10000.0000 [IU] | Freq: Once | INTRAMUSCULAR | Status: AC
  Administered 2018-10-22: 10000 [IU] via SUBCUTANEOUS
  Filled 2018-10-22: qty 1

## 2018-10-22 MED ORDER — VITAMIN C 500 MG PO TABS: 500.0000 mg | ORAL_TABLET | Freq: Every day | ORAL | 2 refills | Status: DC

## 2018-10-22 MED ORDER — EPOETIN ALFA-EPBX 40000 UNIT/ML IJ SOLN
40000.0000 [IU] | Freq: Once | INTRAMUSCULAR | Status: AC
  Administered 2018-10-22: 40000 [IU] via SUBCUTANEOUS
  Filled 2018-10-22: qty 1

## 2018-10-22 NOTE — Progress Notes (Signed)
Patient does not offer any problems today.  

## 2018-10-22 NOTE — Progress Notes (Signed)
Hematology/Oncology  [facility] Telephone:([phone] [phone]   Patient Care Team: Baxter Hire, MD as PCP - General (Internal Medicine)  REFERRING PROVIDER: Baxter Hire, MD  CHIEF COMPLAINTS/REASON FOR VISIT:  Follow-up for anemia secondary to chronic kidney disease.  HISTORY OF PRESENTING ILLNESS:   Arthur [name] is a  85 y.o.  male with PMH listed below was seen in consultation at the request of  Baxter Hire, MD  for evaluation of anemia Patient has chronic comorbidities including diabetes, COPD/emphysema, chronic respiratory failure on home oxygen, CKD stage III, CHF, hypertension and hyperlipidemia. Reviewed patient's previous lab records. Extensive medical records review was performed by me. Anemia has been chronic, dated back to at least 2016.  At that time his hemoglobin was around 11-12. Hemoglobin started to trended down below 10 starting February 2020. 02/11/2018 hemoglobin 9, hematocrit 28.6, 06/17/2018 hemoglobin 7.3 hematocrit of 24, 06/27/2018 hemoglobin 5.8 07/15/2018 hemoglobin 7.5 07/29/2018 hemoglobin 8.2, 08/12/2018, hemoglobin 9.2  Patient reports feeling tired and weak.  Chronic shortness of breath at baseline today.  Daughter [name] was called during the clinical encounter and she was able to listen to the discussion and participate as well.   INTERVAL HISTORY Arthur [name] is a 85 y.o. male who has above history reviewed by me today presents for follow up visit for management of anemia Problems and complaints are listed below: Patient reports feeling well at baseline today.  Denies any shortness of breath more than his baseline. He has a chronic cough. I called patient's daughter [name] per patient's request.  Daughter reports that her torsemide dosing frequency has changed from daily to every other day. Denies fever, chills, nausea, vomiting, diarrhea, chest pain,  abdominal pain, urinary symptoms     Review of  Systems  Constitutional: Positive for fatigue. Negative for appetite change, chills, fever and unexpected weight change.  HENT:   Negative for hearing loss and voice change.   Eyes: Negative for eye problems and icterus.  Respiratory: Positive for shortness of breath. Negative for chest tightness and cough.   Cardiovascular: Negative for chest pain and leg swelling.  Gastrointestinal: Negative for abdominal distention and abdominal pain.  Endocrine: Negative for hot flashes.  Genitourinary: Negative for difficulty urinating, dysuria and frequency.   Musculoskeletal: Negative for arthralgias.  Skin: Negative for itching and rash.  Neurological: Negative for light-headedness and numbness.  Hematological: Negative for adenopathy. Does not bruise/bleed easily.  Psychiatric/Behavioral: Negative for confusion.    MEDICAL HISTORY:  Past Medical History:  Diagnosis Date  . Anemia of chronic kidney failure 08/21/2018  . CHF (congestive heart failure) ([place])   . CKD (chronic kidney disease), stage III   . COPD (chronic obstructive pulmonary disease) ([place])   . Diabetes mellitus without complication ([place])   . Hyperlipidemia   . Hypertension     SURGICAL HISTORY: Past Surgical History:  Procedure Laterality Date  . COLONOSCOPY WITH PROPOFOL N/A 06/11/2018   Procedure: COLONOSCOPY WITH PROPOFOL;  Surgeon: Jonathon Bellows, MD;  Location: [facility] ENDOSCOPY;  Service: Gastroenterology;  Laterality: N/A;  . COLONOSCOPY WITH PROPOFOL N/A 06/13/2018   Procedure: COLONOSCOPY WITH PROPOFOL;  Surgeon: Jonathon Bellows, MD;  Location: [facility] ENDOSCOPY;  Service: Gastroenterology;  Laterality: N/A;  . ESOPHAGOGASTRODUODENOSCOPY Left 06/10/2018   Procedure: ESOPHAGOGASTRODUODENOSCOPY (EGD);  Surgeon: Jonathon Bellows, MD;  Location: [facility] ENDOSCOPY;  Service: Gastroenterology;  Laterality: Left;  . GIVENS CAPSULE STUDY N/A 06/28/2018   Procedure: GIVENS CAPSULE STUDY;  Surgeon: Lucilla Lame, MD;  Location:  [facility] ENDOSCOPY;  Service:  Endoscopy;  Laterality: N/A;  . GIVENS CAPSULE STUDY N/A 06/30/2018   Procedure: GIVENS CAPSULE STUDY;  Surgeon: Jonathon Bellows, MD;  Location: [facility] ENDOSCOPY;  Service: Gastroenterology;  Laterality: N/A;    SOCIAL HISTORY: Social History   Socioeconomic History  . Marital status: Widowed    Spouse name: Not on file  . Number of children: Not on file  . Years of education: Not on file  . Highest education level: Not on file  Occupational History  . Not on file  Social Needs  . Financial resource strain: Not hard at all  . Food insecurity    Worry: Never true    Inability: Never true  . Transportation needs    Medical: No    Non-medical: No  Tobacco Use  . Smoking status: Former Smoker    Packs/day: 2.00    Years: 35.00    Pack years: 70.00    Types: Cigarettes    Quit date: 11/04/1982    Years since quitting: 35.9  . Smokeless tobacco: Current User  Substance and Sexual Activity  . Alcohol use: Not Currently  . Drug use: No  . Sexual activity: Not Currently  Lifestyle  . Physical activity    Days per week: 0 days    Minutes per session: Not on file  . Stress: Not at all  Relationships  . Social Herbalist on phone: Not on file    Gets together: Not on file    Attends religious service: Not on file    Active member of club or organization: Not on file    Attends meetings of clubs or organizations: Not on file    Relationship status: Not on file  . Intimate partner violence    Fear of current or ex partner: Patient refused    Emotionally abused: Patient refused    Physically abused: Patient refused    Forced sexual activity: Patient refused  Other Topics Concern  . Not on file  Social History Narrative  . Not on file    FAMILY HISTORY: Family History  Problem Relation Age of Onset  . COPD Mother   . Cancer Father     ALLERGIES:  is allergic to codeine.  MEDICATIONS:  Current Outpatient Medications  Medication Sig Dispense Refill  .  albuterol (VENTOLIN HFA) 108 (90 Base) MCG/ACT inhaler Inhale 2 puffs into the lungs every 6 (six) hours as needed for wheezing or shortness of breath. 1 Inhaler 2  . amLODipine (NORVASC) 2.5 MG tablet Take 2.5 mg by mouth daily.    Marland Kitchen docusate sodium (COLACE) 100 MG capsule Take by mouth.    Marland Kitchen ipratropium-albuterol (DUONEB) 0.5-2.5 (3) MG/3ML SOLN Take 3 mLs by nebulization every 6 (six) hours as needed. 360 mL 0  . iron polysaccharides (NIFEREX) 150 MG capsule Take 1 capsule (150 mg total) by mouth daily. 30 capsule 0  . LEVEMIR 100 UNIT/ML injection Inject 0.12 mLs (12 Units total) into the skin daily. 10 mL 0  . nystatin cream (MYCOSTATIN) Apply topically 2 (two) times daily. 30 g 0  . pantoprazole (PROTONIX) 40 MG tablet Take 1 tablet (40 mg total) by mouth 2 (two) times daily. 60 tablet 0  . potassium chloride (K-DUR) 10 MEQ tablet Take by mouth.    . pravastatin (PRAVACHOL) 10 MG tablet Take 10 mg by mouth at bedtime.    . sulfamethoxazole-trimethoprim (BACTRIM DS) 800-160 MG tablet Take 1 tablet by mouth 2 (two) times  daily.    . tamsulosin (FLOMAX) 0.4 MG CAPS capsule Take 0.4 mg by mouth daily.    Marland Kitchen torsemide (DEMADEX) 20 MG tablet Take 20 mg by mouth daily.    . vitamin C (ASCORBIC ACID) 500 MG tablet Take 1 tablet (500 mg total) by mouth daily. 30 tablet 2   No current facility-administered medications for this visit.      PHYSICAL EXAMINATION: ECOG PERFORMANCE STATUS: 2 - Symptomatic, <50% confined to bed Vitals:   10/22/18 1424  BP: 124/67  Pulse: 71  Resp: 18  Temp: 99 F (37.2 C)   There were no vitals filed for this visit.  Physical Exam Constitutional:      General: He is not in acute distress.    Appearance: He is ill-appearing.  HENT:     Head: Normocephalic and atraumatic.  Eyes:     General: No scleral icterus.    Pupils: Pupils are equal, round, and reactive to light.  Neck:     Musculoskeletal: Normal range of motion and neck supple.  Cardiovascular:      Rate and Rhythm: Normal rate and regular rhythm.     Heart sounds: Normal heart sounds.  Pulmonary:     Effort: Pulmonary effort is normal. No respiratory distress.     Breath sounds: No wheezing.     Comments: Breathes via nasal cannula oxygen. Bibasilar crackles. Abdominal:     General: Bowel sounds are normal. There is no distension.     Palpations: Abdomen is soft. There is no mass.     Tenderness: There is no abdominal tenderness.  Musculoskeletal: Normal range of motion.        General: No deformity.  Skin:    General: Skin is warm and dry.     Findings: No erythema or rash.  Neurological:     Mental Status: He is alert and oriented to person, place, and time.     Cranial Nerves: No cranial nerve deficit.     Coordination: Coordination normal.  Psychiatric:        Mood and Affect: Mood normal.        Behavior: Behavior normal.        Thought Content: Thought content normal.     LABORATORY DATA:  I have reviewed the data as listed Lab Results  Component Value Date   WBC 7.1 10/22/2018   HGB 7.8 (L) 10/22/2018   HCT 24.8 (L) 10/22/2018   MCV 97.3 10/22/2018   PLT 270 10/22/2018   Recent Labs    07/06/18 1613  07/15/18 1544  08/07/18 1818 08/08/18 0542 08/09/18 0510  NA 139   < > 140   < > 133* 134* 135  K 3.5   < > 3.7   < > 3.3* 3.7 3.2*  CL 95*   < > 95*   < > 89* 90* 92*  CO2 36*   < > 33*   < > 32 30 32  GLUCOSE 131*   < > 137*   < > 199* 190* 110*  BUN 31*   < > 30*   < > 45* 41* 47*  CREATININE 1.96*   < > 2.03*   < > 2.48* 2.25* 2.64*  CALCIUM 8.0*   < > 7.6*   < > 8.7* 8.6* 8.4*  GFRNONAA 31*   < > 29*   < > 23* 26* 21*  GFRAA 36*   < > 34*   < > 27* 30* 25*  PROT 6.0*  --  5.7*  --  8.0  --   --   ALBUMIN 3.0*  --  3.0*  --  3.8  --   --   AST 14*  --  20  --  14*  --   --   ALT 12  --  21  --  12  --   --   ALKPHOS 49  --  57  --  65  --   --   BILITOT 0.8  --  0.3  --  0.9  --   --    < > = values in this interval not displayed.    Iron/TIBC/Ferritin/ %Sat    Component Value Date/Time   IRON 36 (L) 07/15/2018 1544   TIBC 172 (L) 07/15/2018 1544   FERRITIN 535 (H) 07/15/2018 1544   IRONPCTSAT 21 07/15/2018 1544      RADIOGRAPHIC STUDIES: I have personally reviewed the radiological images as listed and agreed with the findings in the report.  Ct Chest Wo Contrast  Result Date: 10/07/2018 CLINICAL DATA:  F/u lung nodule, hx of COPD, former smoker-quit 45 years ago, no hx of cancer or thoracic surgery No complaints today EXAM: CT CHEST WITHOUT CONTRAST TECHNIQUE: Multidetector CT imaging of the chest was performed following the standard protocol without IV contrast. COMPARISON:  Chest CT 07/06/2018, chest radiograph 08/08/2018 FINDINGS: Cardiovascular: Cardiac size upper limits of normal. Moderate coronary artery calcification. Vascular patency is not evaluated in the absence of IV contrast. Moderate atherosclerotic calcification in the thoracic aorta without aneurysm. No pericardial effusion. Mediastinum/Nodes: No enlarged mediastinal or axillary lymph nodes. Thyroid gland, trachea, and esophagus demonstrate no significant findings. Lungs/Pleura: Moderate centrilobular and paraseptal emphysema. Small amount of debris in the trachea. Mild generalized peribronchial thickening. There are reticular right greater than left bibasilar opacities favored to represent atelectasis, superimposed infection difficult to exclude. Moderate size right pleural effusion. No left pleural effusion. No pneumothorax. Redemonstrated irregular ground-glass opacity in the right upper lobe (series 3, image 54) measuring 12 mm, with possible slight increase in fullness of the nodule along the inferior aspect. No solid component identified within the ground-glass nodule. No new solid component identified. No new suspicious nodule identified. Upper Abdomen: Calcified gallstone. No evidence of gallbladder wall thickening. Otherwise negative noncontrast  evaluation of the visualized upper abdomen. Musculoskeletal: No acute abnormality. Healed old right rib fractures. IMPRESSION: 1. Redemonstrated 12 mm irregular ground-glass opacity in the right upper lobe with possible slight increase in fullness compared to the prior study. No solid component identified. Follow-up CT is recommended in 6 months to assess for interval growth. 2. Right greater than left bibasilar opacities, favored to represent atelectasis, superimposed infection difficult to exclude. Moderate right pleural effusion. 3. Coronary artery and aortic atherosclerosis. Aortic Atherosclerosis (ICD10-I70.0) and Emphysema (ICD10-J43.9). Electronically Signed   By: Audie Pinto M.D.   On: 10/07/2018 09:18      ASSESSMENT & PLAN:  1. Anemia of chronic renal failure, unspecified CKD stage   2. CKD (chronic kidney disease) stage 4, GFR 15-29 ml/min (HCC)   Labs are reviewed and discussed with patient. Hemoglobin decreased to 7.8 today. Patient received Retacrit 50,000 units 4 weeks ago.  We will proceed another dose today. I would repeat his iron panel, at the next visit. Patient takes oral iron supplementation.  And I recommend patient to add vitamin C 500 mg to increase absorption. If hemoglobin continues to drop, and he if he becomes symptomatic, can consider blood transfusion. CKD, avoid nephrotoxins. Orders Placed  This Encounter  Procedures  . CBC with Differential/Platelet    Standing Status:   Future    Standing Expiration Date:   10/22/2019  . Iron and TIBC    Standing Status:   Future    Standing Expiration Date:   10/22/2019  . Ferritin    Standing Status:   Future    Standing Expiration Date:   10/22/2019    All questions were answered. The patient knows to call the clinic with any problems questions or concerns.  cc Baxter Hire, MD    Return of visit: 4 weeks.   Earlie Server, MD, PhD Hematology Oncology [facility] Pager-  [phone] 10/22/2018

## 2018-10-24 ENCOUNTER — Other Ambulatory Visit: Payer: Self-pay

## 2018-10-24 ENCOUNTER — Encounter: Payer: Self-pay | Admitting: Nurse Practitioner

## 2018-10-24 ENCOUNTER — Other Ambulatory Visit: Payer: Medicare HMO | Admitting: Nurse Practitioner

## 2018-10-24 DIAGNOSIS — Z515 Encounter for palliative care: Secondary | ICD-10-CM

## 2018-10-24 NOTE — Progress Notes (Signed)
[facility] Consult Note Telephone: [phone]  Fax: [phone]  PATIENT NAME: Arthur [name] DOB: [birth year] MRN: [id number]  PRIMARY CARE PROVIDER:   Baxter Hire, MD  REFERRING PROVIDER:  Baxter Hire, MD [place],  [place] [address]  RESPONSIBLE PARTY:   Arthur [name] daughter [phone]  Due to the COVID-19 crisis, this visit was done via telemedicine from my office and it was initiated and consent by this patient and or family.  RECOMMENDATIONS and PLAN: 1.ACP: DNR MOST form to be discussed at next [facility] visit.   2.Dyspneic secondary toCOPD/CHF;remain stable at present time. ContinueO2, inhalers, inhalation therapy as needed.   3. Weakness improving, continue to encourage restorative exercises  4.Palliative care encounter Palliative medicine team will continue to support patient, patient's family, and medical team. Visit consisted of counseling and education dealing with the complex and emotionally intense issues of symptom management and palliative care in the setting of serious and potentially life-threatening illness  I spent 35 minutes providing this consultation,  from 4:15pm to 4:50pm. More than 50% of the time in this consultation was spent coordinating communication.   HISTORY OF PRESENT ILLNESS:  Arthur [name] is a 85 y.o. year old male with multiple medical problems including COPD, congestive heart failure, anemia of chronic disease, chronic kidney disease stage 3, diabetes, hypertension, hyperlipidemia, history of GI bleed, allergies, tobacco use, obesity. I called Arthur [name], Arthur. [name] daughter for scheduled telemedicine telephonic palliative care follow-up visit. Arthur [name] in agreement. We talked about how Arthur [name] has been feeling. Arthur [name] endorses that he is doing well. It's been some time since he has had a fall. He is ambulatory able to dress himself. He does wear his oxygen at night. His  appetite has been good. No recent hospitalizations. He does continue to go to the cancer center for Reticrit infusions for anemia. He received the last one on 10/20 / 2020. We talked about repeat CT scan with results no change to follow up in 6 months. He continues to go to Podiatry, Dr Arthur [name] for mycotic nails. We talked about chronic disease progression. We talked about medical goals. No other new changes. Discussed at present time he is stable. That's about role of palliative care and plan of care. Discussed will follow up in two months if needed or sooner should he declined. Arthur [name] in agreement. Appointment scheduled. Therapeutic listening and emotional support provided. Questions answered to satisfaction. Contact information.  Palliative Care was asked to help to continue to address goals of care.   CODE STATUS: DNR  PPS: 50%  HOSPICE ELIGIBILITY/DIAGNOSIS: TBD  PAST MEDICAL HISTORY:  Past Medical History:  Diagnosis Date  . Anemia of chronic kidney failure 08/21/2018  . CHF (congestive heart failure) ([place])   . CKD (chronic kidney disease), stage III   . COPD (chronic obstructive pulmonary disease) ([place])   . Diabetes mellitus without complication ([place])   . Hyperlipidemia   . Hypertension     SOCIAL HX:  Social History   Tobacco Use  . Smoking status: Former Smoker    Packs/day: 2.00    Years: 35.00    Pack years: 70.00    Types: Cigarettes    Quit date: 11/04/1982    Years since quitting: 35.9  . Smokeless tobacco: Current User  Substance Use Topics  . Alcohol use: Not Currently    ALLERGIES:  Allergies  Allergen Reactions  . Codeine Other (See Comments)    Constipation  PERTINENT MEDICATIONS:  Outpatient Encounter Medications as of 10/24/2018  Medication Sig  . albuterol (VENTOLIN HFA) 108 (90 Base) MCG/ACT inhaler Inhale 2 puffs into the lungs every 6 (six) hours as needed for wheezing or shortness of breath.  Marland Kitchen amLODipine (NORVASC) 2.5 MG tablet Take 2.5 mg by  mouth daily.  Marland Kitchen docusate sodium (COLACE) 100 MG capsule Take by mouth.  Marland Kitchen ipratropium-albuterol (DUONEB) 0.5-2.5 (3) MG/3ML SOLN Take 3 mLs by nebulization every 6 (six) hours as needed.  . iron polysaccharides (NIFEREX) 150 MG capsule Take 1 capsule (150 mg total) by mouth daily.  Marland Kitchen LEVEMIR 100 UNIT/ML injection Inject 0.12 mLs (12 Units total) into the skin daily.  Marland Kitchen nystatin cream (MYCOSTATIN) Apply topically 2 (two) times daily.  . pantoprazole (PROTONIX) 40 MG tablet Take 1 tablet (40 mg total) by mouth 2 (two) times daily.  . potassium chloride (K-DUR) 10 MEQ tablet Take by mouth.  . pravastatin (PRAVACHOL) 10 MG tablet Take 10 mg by mouth at bedtime.  . sulfamethoxazole-trimethoprim (BACTRIM DS) 800-160 MG tablet Take 1 tablet by mouth 2 (two) times daily.  . tamsulosin (FLOMAX) 0.4 MG CAPS capsule Take 0.4 mg by mouth daily.  Marland Kitchen torsemide (DEMADEX) 20 MG tablet Take 20 mg by mouth daily.  . vitamin C (ASCORBIC ACID) 500 MG tablet Take 1 tablet (500 mg total) by mouth daily.   No facility-administered encounter medications on file as of 10/24/2018.     PHYSICAL EXAM:   Deferred  Kimetha Trulson Z Emani Taussig, NP

## 2018-10-27 ENCOUNTER — Inpatient Hospital Stay
Admission: EM | Admit: 2018-10-27 | Discharge: 2018-11-05 | DRG: 854 | Disposition: A | Discharge status: Skilled Nursing Facility | Payer: Medicare HMO | Attending: Internal Medicine | Admitting: Internal Medicine

## 2018-10-27 ENCOUNTER — Emergency Department: Payer: Medicare HMO

## 2018-10-27 ENCOUNTER — Other Ambulatory Visit: Payer: Self-pay

## 2018-10-27 ENCOUNTER — Encounter: Payer: Self-pay | Admitting: Emergency Medicine

## 2018-10-27 DIAGNOSIS — Z9981 Dependence on supplemental oxygen: Secondary | ICD-10-CM

## 2018-10-27 DIAGNOSIS — N390 Urinary tract infection, site not specified: Secondary | ICD-10-CM | POA: Diagnosis not present

## 2018-10-27 DIAGNOSIS — K429 Umbilical hernia without obstruction or gangrene: Secondary | ICD-10-CM | POA: Diagnosis not present

## 2018-10-27 DIAGNOSIS — A419 Sepsis, unspecified organism: Principal | ICD-10-CM | POA: Diagnosis present

## 2018-10-27 DIAGNOSIS — Z79899 Other long term (current) drug therapy: Secondary | ICD-10-CM | POA: Diagnosis not present

## 2018-10-27 DIAGNOSIS — I509 Heart failure, unspecified: Secondary | ICD-10-CM | POA: Diagnosis not present

## 2018-10-27 DIAGNOSIS — Z20828 Contact with and (suspected) exposure to other viral communicable diseases: Secondary | ICD-10-CM | POA: Diagnosis present

## 2018-10-27 DIAGNOSIS — N184 Chronic kidney disease, stage 4 (severe): Secondary | ICD-10-CM | POA: Diagnosis present

## 2018-10-27 DIAGNOSIS — J90 Pleural effusion, not elsewhere classified: Secondary | ICD-10-CM | POA: Diagnosis not present

## 2018-10-27 DIAGNOSIS — E785 Hyperlipidemia, unspecified: Secondary | ICD-10-CM | POA: Diagnosis present

## 2018-10-27 DIAGNOSIS — J302 Other seasonal allergic rhinitis: Secondary | ICD-10-CM | POA: Diagnosis present

## 2018-10-27 DIAGNOSIS — N189 Chronic kidney disease, unspecified: Secondary | ICD-10-CM | POA: Diagnosis not present

## 2018-10-27 DIAGNOSIS — I10 Essential (primary) hypertension: Secondary | ICD-10-CM | POA: Diagnosis not present

## 2018-10-27 DIAGNOSIS — M2550 Pain in unspecified joint: Secondary | ICD-10-CM | POA: Diagnosis not present

## 2018-10-27 DIAGNOSIS — Z419 Encounter for procedure for purposes other than remedying health state, unspecified: Secondary | ICD-10-CM

## 2018-10-27 DIAGNOSIS — S32020A Wedge compression fracture of second lumbar vertebra, initial encounter for closed fracture: Secondary | ICD-10-CM | POA: Diagnosis present

## 2018-10-27 DIAGNOSIS — D631 Anemia in chronic kidney disease: Secondary | ICD-10-CM | POA: Diagnosis not present

## 2018-10-27 DIAGNOSIS — I5032 Chronic diastolic (congestive) heart failure: Secondary | ICD-10-CM | POA: Diagnosis present

## 2018-10-27 DIAGNOSIS — N400 Enlarged prostate without lower urinary tract symptoms: Secondary | ICD-10-CM | POA: Diagnosis present

## 2018-10-27 DIAGNOSIS — Z794 Long term (current) use of insulin: Secondary | ICD-10-CM | POA: Diagnosis not present

## 2018-10-27 DIAGNOSIS — M48061 Spinal stenosis, lumbar region without neurogenic claudication: Secondary | ICD-10-CM | POA: Diagnosis not present

## 2018-10-27 DIAGNOSIS — E1122 Type 2 diabetes mellitus with diabetic chronic kidney disease: Secondary | ICD-10-CM | POA: Diagnosis not present

## 2018-10-27 DIAGNOSIS — J9611 Chronic respiratory failure with hypoxia: Secondary | ICD-10-CM | POA: Diagnosis not present

## 2018-10-27 DIAGNOSIS — K219 Gastro-esophageal reflux disease without esophagitis: Secondary | ICD-10-CM | POA: Diagnosis present

## 2018-10-27 DIAGNOSIS — I959 Hypotension, unspecified: Secondary | ICD-10-CM | POA: Diagnosis not present

## 2018-10-27 DIAGNOSIS — I130 Hypertensive heart and chronic kidney disease with heart failure and stage 1 through stage 4 chronic kidney disease, or unspecified chronic kidney disease: Secondary | ICD-10-CM | POA: Diagnosis not present

## 2018-10-27 DIAGNOSIS — Z03818 Encounter for observation for suspected exposure to other biological agents ruled out: Secondary | ICD-10-CM | POA: Diagnosis not present

## 2018-10-27 DIAGNOSIS — N183 Chronic kidney disease, stage 3 unspecified: Secondary | ICD-10-CM | POA: Diagnosis not present

## 2018-10-27 DIAGNOSIS — J441 Chronic obstructive pulmonary disease with (acute) exacerbation: Secondary | ICD-10-CM | POA: Diagnosis not present

## 2018-10-27 DIAGNOSIS — N12 Tubulo-interstitial nephritis, not specified as acute or chronic: Secondary | ICD-10-CM | POA: Diagnosis present

## 2018-10-27 DIAGNOSIS — J449 Chronic obstructive pulmonary disease, unspecified: Secondary | ICD-10-CM | POA: Diagnosis present

## 2018-10-27 DIAGNOSIS — Z7401 Bed confinement status: Secondary | ICD-10-CM | POA: Diagnosis not present

## 2018-10-27 DIAGNOSIS — Z66 Do not resuscitate: Secondary | ICD-10-CM | POA: Diagnosis not present

## 2018-10-27 DIAGNOSIS — Z720 Tobacco use: Secondary | ICD-10-CM

## 2018-10-27 DIAGNOSIS — E119 Type 2 diabetes mellitus without complications: Secondary | ICD-10-CM | POA: Diagnosis not present

## 2018-10-27 DIAGNOSIS — B9689 Other specified bacterial agents as the cause of diseases classified elsewhere: Secondary | ICD-10-CM | POA: Diagnosis present

## 2018-10-27 DIAGNOSIS — M5126 Other intervertebral disc displacement, lumbar region: Secondary | ICD-10-CM | POA: Diagnosis not present

## 2018-10-27 DIAGNOSIS — Z862 Personal history of diseases of the blood and blood-forming organs and certain disorders involving the immune mechanism: Secondary | ICD-10-CM | POA: Diagnosis not present

## 2018-10-27 DIAGNOSIS — Z825 Family history of asthma and other chronic lower respiratory diseases: Secondary | ICD-10-CM | POA: Diagnosis not present

## 2018-10-27 DIAGNOSIS — Z885 Allergy status to narcotic agent status: Secondary | ICD-10-CM

## 2018-10-27 DIAGNOSIS — S32020D Wedge compression fracture of second lumbar vertebra, subsequent encounter for fracture with routine healing: Secondary | ICD-10-CM | POA: Diagnosis not present

## 2018-10-27 DIAGNOSIS — Z1810 Retained metal fragments, unspecified: Secondary | ICD-10-CM

## 2018-10-27 DIAGNOSIS — Z135 Encounter for screening for eye and ear disorders: Secondary | ICD-10-CM | POA: Diagnosis not present

## 2018-10-27 LAB — CBC WITH DIFFERENTIAL/PLATELET
Abs Immature Granulocytes: 0.12 10*3/uL — ABNORMAL HIGH (ref 0.00–0.07)
Basophils Absolute: 0.1 10*3/uL (ref 0.0–0.1)
Basophils Relative: 1 %
Eosinophils Absolute: 0 10*3/uL (ref 0.0–0.5)
Eosinophils Relative: 0 %
HCT: 29.1 % — ABNORMAL LOW (ref 39.0–52.0)
Hemoglobin: 9.1 g/dL — ABNORMAL LOW (ref 13.0–17.0)
Immature Granulocytes: 1 %
Lymphocytes Relative: 5 %
Lymphs Abs: 0.9 10*3/uL (ref 0.7–4.0)
MCH: 30.2 pg (ref 26.0–34.0)
MCHC: 31.3 g/dL (ref 30.0–36.0)
MCV: 96.7 fL (ref 80.0–100.0)
Monocytes Absolute: 1.1 10*3/uL — ABNORMAL HIGH (ref 0.1–1.0)
Monocytes Relative: 6 %
Neutro Abs: 16.6 10*3/uL — ABNORMAL HIGH (ref 1.7–7.7)
Neutrophils Relative %: 87 %
Platelets: 390 10*3/uL (ref 150–400)
RBC: 3.01 MIL/uL — ABNORMAL LOW (ref 4.22–5.81)
RDW: 19.8 % — ABNORMAL HIGH (ref 11.5–15.5)
WBC: 18.9 10*3/uL — ABNORMAL HIGH (ref 4.0–10.5)
nRBC: 0.4 % — ABNORMAL HIGH (ref 0.0–0.2)

## 2018-10-27 LAB — URINALYSIS, COMPLETE (UACMP) WITH MICROSCOPIC
Bacteria, UA: NONE SEEN
Bilirubin Urine: NEGATIVE
Glucose, UA: NEGATIVE mg/dL
Ketones, ur: 5 mg/dL — AB
Nitrite: POSITIVE — AB
Protein, ur: 100 mg/dL — AB
Specific Gravity, Urine: 1.012 (ref 1.005–1.030)
Squamous Epithelial / HPF: NONE SEEN (ref 0–5)
WBC, UA: >50 WBC/hpf — ABNORMAL HIGH (ref 0–5)
pH: 5 (ref 5.0–8.0)

## 2018-10-27 LAB — COMPREHENSIVE METABOLIC PANEL
ALT: 13 U/L (ref 0–44)
AST: 15 U/L (ref 15–41)
Albumin: 3.9 g/dL (ref 3.5–5.0)
Alkaline Phosphatase: 69 U/L (ref 38–126)
Anion gap: 13 (ref 5–15)
BUN: 33 mg/dL — ABNORMAL HIGH (ref 8–23)
CO2: 22 mmol/L (ref 22–32)
Calcium: 8.7 mg/dL — ABNORMAL LOW (ref 8.9–10.3)
Chloride: 100 mmol/L (ref 98–111)
Creatinine, Ser: 2.41 mg/dL — ABNORMAL HIGH (ref 0.61–1.24)
GFR calc Af Amer: 28 mL/min — ABNORMAL LOW (ref >60)
GFR calc non Af Amer: 24 mL/min — ABNORMAL LOW (ref >60)
Glucose, Bld: 160 mg/dL — ABNORMAL HIGH (ref 70–99)
Potassium: 4.3 mmol/L (ref 3.5–5.1)
Sodium: 135 mmol/L (ref 135–145)
Total Bilirubin: 1 mg/dL (ref 0.3–1.2)
Total Protein: 7.4 g/dL (ref 6.5–8.1)

## 2018-10-27 LAB — LACTIC ACID, PLASMA: Lactic Acid, Venous: 1.2 mmol/L (ref 0.5–1.9)

## 2018-10-27 LAB — LIPASE, BLOOD: Lipase: 17 U/L (ref 11–51)

## 2018-10-27 MED ORDER — MAGNESIUM HYDROXIDE 400 MG/5ML PO SUSP: 30.0000 mL | Freq: Every day | ORAL | Status: DC | PRN

## 2018-10-27 MED ORDER — ENOXAPARIN SODIUM 30 MG/0.3ML ~~LOC~~ SOLN
30.0000 mg | SUBCUTANEOUS | Status: DC
  Administered 2018-10-28 – 2018-10-30 (×3): 30 mg via SUBCUTANEOUS
  Filled 2018-10-27 (×3): qty 0.3

## 2018-10-27 MED ORDER — AMLODIPINE BESYLATE 5 MG PO TABS
2.5000 mg | ORAL_TABLET | Freq: Every day | ORAL | Status: DC
  Administered 2018-10-28 – 2018-10-31 (×4): 2.5 mg via ORAL
  Filled 2018-10-27 (×4): qty 1

## 2018-10-27 MED ORDER — SODIUM CHLORIDE 0.9 % IV SOLN
INTRAVENOUS | Status: DC
  Administered 2018-10-28: 03:00:00 via INTRAVENOUS

## 2018-10-27 MED ORDER — PANTOPRAZOLE SODIUM 40 MG PO TBEC
40.0000 mg | DELAYED_RELEASE_TABLET | Freq: Two times a day (BID) | ORAL | Status: DC
  Administered 2018-10-28 – 2018-11-05 (×17): 40 mg via ORAL
  Filled 2018-10-27 (×18): qty 1

## 2018-10-27 MED ORDER — ONDANSETRON HCL 4 MG PO TABS
4.0000 mg | ORAL_TABLET | Freq: Four times a day (QID) | ORAL | Status: DC | PRN
  Administered 2018-11-02: 4 mg via ORAL
  Filled 2018-10-27: qty 1

## 2018-10-27 MED ORDER — SODIUM CHLORIDE 0.9 % IV SOLN: 1.0000 g | Freq: Once | INTRAVENOUS | Status: DC

## 2018-10-27 MED ORDER — INSULIN DETEMIR 100 UNIT/ML ~~LOC~~ SOLN
12.0000 [IU] | Freq: Every day | SUBCUTANEOUS | Status: DC
  Administered 2018-10-28 – 2018-11-05 (×9): 12 [IU] via SUBCUTANEOUS
  Filled 2018-10-27 (×9): qty 0.12

## 2018-10-27 MED ORDER — ONDANSETRON HCL 4 MG/2ML IJ SOLN
4.0000 mg | Freq: Once | INTRAMUSCULAR | Status: AC
  Administered 2018-10-27: 4 mg via INTRAVENOUS
  Filled 2018-10-27: qty 2

## 2018-10-27 MED ORDER — ALBUTEROL SULFATE (2.5 MG/3ML) 0.083% IN NEBU: 2.5000 mg | INHALATION_SOLUTION | Freq: Four times a day (QID) | RESPIRATORY_TRACT | Status: DC | PRN

## 2018-10-27 MED ORDER — INSULIN ASPART 100 UNIT/ML ~~LOC~~ SOLN
15.0000 [IU] | Freq: Three times a day (TID) | SUBCUTANEOUS | Status: DC
  Administered 2018-10-28: 15 [IU] via SUBCUTANEOUS
  Filled 2018-10-27 (×2): qty 1

## 2018-10-27 MED ORDER — SODIUM CHLORIDE 0.9 % IV SOLN
1.0000 g | INTRAVENOUS | Status: DC
  Administered 2018-10-28 – 2018-10-29 (×2): 1 g via INTRAVENOUS
  Filled 2018-10-27: qty 10
  Filled 2018-10-27 (×2): qty 1

## 2018-10-27 MED ORDER — TRAZODONE HCL 50 MG PO TABS
25.0000 mg | ORAL_TABLET | Freq: Every evening | ORAL | Status: DC | PRN
  Administered 2018-10-31 – 2018-11-04 (×3): 25 mg via ORAL
  Filled 2018-10-27 (×3): qty 1

## 2018-10-27 MED ORDER — ONDANSETRON HCL 4 MG/2ML IJ SOLN
4.0000 mg | Freq: Four times a day (QID) | INTRAMUSCULAR | Status: DC | PRN
  Administered 2018-10-29 – 2018-11-01 (×5): 4 mg via INTRAVENOUS
  Filled 2018-10-27 (×5): qty 2

## 2018-10-27 MED ORDER — ACETAMINOPHEN 325 MG PO TABS
650.0000 mg | ORAL_TABLET | Freq: Four times a day (QID) | ORAL | Status: DC | PRN
  Administered 2018-10-28: 650 mg via ORAL
  Filled 2018-10-27: qty 2

## 2018-10-27 MED ORDER — IPRATROPIUM-ALBUTEROL 0.5-2.5 (3) MG/3ML IN SOLN: 3.0000 mL | Freq: Four times a day (QID) | RESPIRATORY_TRACT | Status: DC | PRN

## 2018-10-27 MED ORDER — LEVOFLOXACIN IN D5W 750 MG/150ML IV SOLN
750.0000 mg | Freq: Once | INTRAVENOUS | Status: AC
  Administered 2018-10-27: 750 mg via INTRAVENOUS
  Filled 2018-10-27: qty 150

## 2018-10-27 MED ORDER — SODIUM CHLORIDE 0.9 % IV BOLUS
500.0000 mL | Freq: Once | INTRAVENOUS | Status: AC
  Administered 2018-10-27: 500 mL via INTRAVENOUS

## 2018-10-27 MED ORDER — POTASSIUM CHLORIDE ER 10 MEQ PO TBCR: 10.0000 meq | EXTENDED_RELEASE_TABLET | Freq: Every day | ORAL | Status: DC

## 2018-10-27 MED ORDER — ACETAMINOPHEN 650 MG RE SUPP: 650.0000 mg | Freq: Four times a day (QID) | RECTAL | Status: DC | PRN

## 2018-10-27 MED ORDER — TAMSULOSIN HCL 0.4 MG PO CAPS
0.4000 mg | ORAL_CAPSULE | Freq: Every day | ORAL | Status: DC
  Administered 2018-10-28 – 2018-11-05 (×9): 0.4 mg via ORAL
  Filled 2018-10-27 (×9): qty 1

## 2018-10-27 MED ORDER — POLYSACCHARIDE IRON COMPLEX 150 MG PO CAPS
150.0000 mg | ORAL_CAPSULE | Freq: Every day | ORAL | Status: DC
  Administered 2018-10-28 – 2018-11-05 (×9): 150 mg via ORAL
  Filled 2018-10-27 (×9): qty 1

## 2018-10-27 MED ORDER — PRAVASTATIN SODIUM 20 MG PO TABS
10.0000 mg | ORAL_TABLET | Freq: Every day | ORAL | Status: DC
  Administered 2018-10-28 – 2018-11-04 (×8): 10 mg via ORAL
  Filled 2018-10-27 (×9): qty 1

## 2018-10-27 MED ORDER — VITAMIN C 500 MG PO TABS
500.0000 mg | ORAL_TABLET | Freq: Every day | ORAL | Status: DC
  Administered 2018-10-28 – 2018-11-05 (×9): 500 mg via ORAL
  Filled 2018-10-27 (×9): qty 1

## 2018-10-27 MED ORDER — TORSEMIDE 20 MG PO TABS
20.0000 mg | ORAL_TABLET | ORAL | Status: DC
  Administered 2018-10-28 – 2018-11-05 (×5): 20 mg via ORAL
  Filled 2018-10-27 (×5): qty 1

## 2018-10-27 NOTE — ED Provider Notes (Signed)
[facility] Emergency Department Provider Note  ____________________________________________  Time seen: Approximately 3:13 PM  I have reviewed the triage vital signs and the nursing notes.   HISTORY  Chief Complaint Back Pain    HPI Arthur [name] is a 85 y.o. male with a history of stage IV chronic kidney disease, CHF, COPD, diabetes and hypertension, presents to the emergency department with acute bilateral low back pain for the past 2 to 3 days, nausea and fatigue.  Patient is accompanied by his daughter who states that his urine appears cloudy.  He denies dysuria, hematuria or increased urinary frequency.  He has not been vomiting at home.  No fever or chills.  He denies chest pain, chest tightness or shortness of breath.  No abdominal pain.  No new edema of the bilateral ankles.  No recent weight gain.  No other alleviating measures have been attempted.        Past Medical History:  Diagnosis Date  . Anemia of chronic kidney failure 08/21/2018  . CHF (congestive heart failure) ([place])   . CKD (chronic kidney disease), stage III   . COPD (chronic obstructive pulmonary disease) ([place])   . Diabetes mellitus without complication ([place])   . Hyperlipidemia   . Hypertension     Patient Active Problem List   Diagnosis Date Noted  . Anemia of chronic kidney failure 08/21/2018  . UTI (urinary tract infection) 08/07/2018  . Acute on chronic renal failure ([place]) 08/07/2018  . HLD (hyperlipidemia) 07/24/2018  . Acute on chronic respiratory failure with hypoxia ([place]) 07/06/2018  . Severe anemia 06/27/2018  . GI bleeding 06/09/2018  . Sepsis ([place]) 05/23/2018  . Cellulitis of right lower extremity 05/22/2018  . Diabetes ([place]) 10/24/2006  . OBESITY 10/24/2006  . Essential hypertension 10/24/2006  . ALLERGIC RHINITIS 10/24/2006  . COPD (chronic obstructive pulmonary disease) ([place]) 10/24/2006  . TOBACCO ABUSE, HX OF 10/24/2006    Past Surgical History:   Procedure Laterality Date  . COLONOSCOPY WITH PROPOFOL N/A 06/11/2018   Procedure: COLONOSCOPY WITH PROPOFOL;  Surgeon: Jonathon Bellows, MD;  Location: [facility] ENDOSCOPY;  Service: Gastroenterology;  Laterality: N/A;  . COLONOSCOPY WITH PROPOFOL N/A 06/13/2018   Procedure: COLONOSCOPY WITH PROPOFOL;  Surgeon: Jonathon Bellows, MD;  Location: [facility] ENDOSCOPY;  Service: Gastroenterology;  Laterality: N/A;  . ESOPHAGOGASTRODUODENOSCOPY Left 06/10/2018   Procedure: ESOPHAGOGASTRODUODENOSCOPY (EGD);  Surgeon: Jonathon Bellows, MD;  Location: [facility] ENDOSCOPY;  Service: Gastroenterology;  Laterality: Left;  . GIVENS CAPSULE STUDY N/A 06/28/2018   Procedure: GIVENS CAPSULE STUDY;  Surgeon: Lucilla Lame, MD;  Location: [facility] ENDOSCOPY;  Service: Endoscopy;  Laterality: N/A;  . GIVENS CAPSULE STUDY N/A 06/30/2018   Procedure: GIVENS CAPSULE STUDY;  Surgeon: Jonathon Bellows, MD;  Location: [facility] ENDOSCOPY;  Service: Gastroenterology;  Laterality: N/A;    Prior to Admission medications   Medication Sig Start Date End Date Taking? Authorizing Provider  albuterol (VENTOLIN HFA) 108 (90 Base) MCG/ACT inhaler Inhale 2 puffs into the lungs every 6 (six) hours as needed for wheezing or shortness of breath. 05/27/18  Yes Demetrios Loll, MD  amLODipine (NORVASC) 2.5 MG tablet Take 2.5 mg by mouth daily. 04/26/18  Yes [provider]  insulin aspart (NOVOLOG) 100 UNIT/ML injection Inject 15 Units into the skin 3 (three) times daily with meals.   Yes [provider]  ipratropium-albuterol (DUONEB) 0.5-2.5 (3) MG/3ML SOLN Take 3 mLs by nebulization every 6 (six) hours as needed. 05/28/18  Yes Mody, Ulice Bold, MD  iron polysaccharides (NIFEREX) 150 MG  capsule Take 1 capsule (150 mg total) by mouth daily. 07/11/18  Yes Fritzi Mandes, MD  LEVEMIR 100 UNIT/ML injection Inject 0.12 mLs (12 Units total) into the skin daily. Patient taking differently: Inject 30 Units into the skin daily.  07/10/18  Yes Fritzi Mandes, MD  nystatin cream (MYCOSTATIN)  Apply topically 2 (two) times daily. 05/28/18  Yes Mody, Ulice Bold, MD  pantoprazole (PROTONIX) 40 MG tablet Take 1 tablet (40 mg total) by mouth 2 (two) times daily. 06/14/18  Yes Lang Snow, NP  potassium chloride (K-DUR) 10 MEQ tablet Take 10 mEq by mouth daily.  08/20/18 08/20/19 Yes [provider]  pravastatin (PRAVACHOL) 10 MG tablet Take 10 mg by mouth at bedtime. 04/25/18  Yes [provider]  tamsulosin (FLOMAX) 0.4 MG CAPS capsule Take 0.4 mg by mouth daily. 04/25/18  Yes [provider]  torsemide (DEMADEX) 20 MG tablet Take 20 mg by mouth every other day.    Yes Baxter Hire, MD  vitamin C (ASCORBIC ACID) 500 MG tablet Take 1 tablet (500 mg total) by mouth daily. Patient not taking: Reported on 10/27/2018 10/22/18   Earlie Server, MD    Allergies Codeine  Family History  Problem Relation Age of Onset  . COPD Mother   . Cancer Father     Social History Social History   Tobacco Use  . Smoking status: Former Smoker    Packs/day: 2.00    Years: 35.00    Pack years: 70.00    Types: Cigarettes    Quit date: 11/04/1982    Years since quitting: 36.0  . Smokeless tobacco: Current User  Substance Use Topics  . Alcohol use: Not Currently  . Drug use: No     Review of Systems  Constitutional: No fever/chills Eyes: No visual changes. No discharge ENT: No upper respiratory complaints. Cardiovascular: no chest pain. Respiratory: no cough. No SOB. Gastrointestinal: No abdominal pain.  No nausea, no vomiting.  No diarrhea.  No constipation. Genitourinary: Negative for dysuria. No hematuria Musculoskeletal: Patient has low back pain.  Skin: Negative for rash, abrasions, lacerations, ecchymosis. Neurological: Negative for headaches, focal weakness or numbness.   ____________________________________________   PHYSICAL EXAM:  VITAL SIGNS: ED Triage Vitals  Enc Vitals Group     BP 10/27/18 1344 (!) 142/40     Pulse Rate 10/27/18 1344 93      Resp 10/27/18 1344 16     Temp 10/27/18 1344 98.6 F (37 C)     Temp Source 10/27/18 1344 Oral     SpO2 10/27/18 1344 92 %     Weight 10/27/18 1344 226 lb (102.5 kg)     Height 10/27/18 1344 5\' 8"  (1.727 m)     Head Circumference --      Peak Flow --      Pain Score 10/27/18 1357 3     Pain Loc --      Pain Edu? --      Excl. in [place]? --      Constitutional: Alert and oriented. Well appearing and in no acute distress. Eyes: Conjunctivae are normal. PERRL. EOMI. Head: Atraumatic. ENT:      Nose: No congestion/rhinnorhea.      Mouth/Throat: Mucous membranes are moist.  Hematological/Lymphatic/Immunilogical: No cervical lymphadenopathy.  Cardiovascular: Normal rate, regular rhythm. Normal S1 and S2.  Good peripheral circulation. Respiratory: Normal respiratory effort without tachypnea or retractions. Lungs CTAB. Good air entry to the bases with no decreased or absent breath sounds. Gastrointestinal: Bowel  sounds 4 quadrants. Soft and nontender to palpation. No guarding or rigidity. No palpable masses. No distention. No CVA tenderness. Musculoskeletal: Full range of motion to all extremities. No gross deformities appreciated. Neurologic:  Normal speech and language. No gross focal neurologic deficits are appreciated.  Skin:  Skin is warm, dry and intact. No rash noted. Psychiatric: Mood and affect are normal. Speech and behavior are normal. Patient exhibits appropriate insight and judgement.   ____________________________________________   LABS (all labs ordered are listed, but only abnormal results are displayed)  Labs Reviewed  CBC WITH DIFFERENTIAL/PLATELET - Abnormal; Notable for the following components:      Result Value   WBC 18.9 (*)    RBC 3.01 (*)    Hemoglobin 9.1 (*)    HCT 29.1 (*)    RDW 19.8 (*)    nRBC 0.4 (*)    Neutro Abs 16.6 (*)    Monocytes Absolute 1.1 (*)    Abs Immature Granulocytes 0.12 (*)    All other components within normal limits   COMPREHENSIVE METABOLIC PANEL - Abnormal; Notable for the following components:   Glucose, Bld 160 (*)    BUN 33 (*)    Creatinine, Ser 2.41 (*)    Calcium 8.7 (*)    GFR calc non Af Amer 24 (*)    GFR calc Af Amer 28 (*)    All other components within normal limits  URINALYSIS, COMPLETE (UACMP) WITH MICROSCOPIC - Abnormal; Notable for the following components:   Color, Urine YELLOW (*)    APPearance CLOUDY (*)    Hgb urine dipstick MODERATE (*)    Ketones, ur 5 (*)    Protein, ur 100 (*)    Nitrite POSITIVE (*)    Leukocytes,Ua LARGE (*)    WBC, UA >50 (*)    All other components within normal limits  SARS CORONAVIRUS 2 (TAT 6-24 HRS)  URINE CULTURE  LIPASE, BLOOD  LACTIC ACID, PLASMA   ____________________________________________  EKG   ____________________________________________  RADIOLOGY I personally viewed and evaluated these images as part of my medical decision making, as well as reviewing the written report by the radiologist.    Dg Chest 2 View  Result Date: 10/27/2018 CLINICAL DATA:  Cough and pain EXAM: CHEST - 2 VIEW COMPARISON:  August 08, 2018 FINDINGS: There is a fairly small pleural effusion with atelectatic change in the right base. Lungs elsewhere are clear. Heart size and pulmonary vascularity are normal. No adenopathy. Bones are osteoporotic. IMPRESSION: Fairly small right pleural effusion with right base atelectasis. No edema or consolidation. Cardiac silhouette stable from prior study. No adenopathy evident. Electronically Signed   By: Lowella Grip III M.D.   On: 10/27/2018 15:46   Ct Renal Stone Study  Result Date: 10/27/2018 CLINICAL DATA:  Lower back pain for several days, stone disease suspected EXAM: CT ABDOMEN AND PELVIS WITHOUT CONTRAST TECHNIQUE: Multidetector CT imaging of the abdomen and pelvis was performed following the standard protocol without IV contrast. COMPARISON:  CT chest 10/07/2018, CT abdomen pelvis August 07, 2018  FINDINGS: Lower chest: Moderate right pleural effusion with adjacent volume loss and opacity favoring atelectasis though infection is not fully excluded. Mild left basilar atelectasis. Upper limits normal size heart. No pericardial effusion. Coronary artery calcification is seen. Calcifications are present on the aortic leaflets. Hepatobiliary: Redemonstration of 1.9 cm peripheral E calcified gallstone in the gallbladder. No pericholecystic inflammation or biliary ductal dilatation. No focal hepatic lesions or hepatic surface contour irregularity. Pancreas: Unremarkable. No  pancreatic ductal dilatation or surrounding inflammatory changes. Spleen: Normal in size without focal abnormality. Adrenals/Urinary Tract: Nodular thickening of the adrenal glands is similar to prior and may reflect senescent change. No visible or contour deforming renal lesions. No urolithiasis. No hydronephrosis. Mild nonspecific bilateral perinephric stranding is similar to comparison exam. There is circumferential bladder wall thickening and perivesicular hazy stranding greater than expected for underdistention. Stomach/Bowel: Distal esophagus, stomach and duodenal sweep are unremarkable. No small bowel wall thickening or dilatation. No evidence of obstruction. A normal appendix is visualized. No colonic dilatation or wall thickening. Moderate stool burden. Vascular/Lymphatic: Extensive atherosclerotic calcification of the aorta and branch vessels. Luminal evaluation limited in the absence of contrast media. Focal region of mid mesenteric hazy stranding with numerous reactive appearing clustered mid mesenteric lymph nodes compatible with mesenteritis (2/47). Reproductive: Enlarged prostate. Question a keyhole defect in the central prostate which may reflect prior TURP. Seminal vesicles are symmetric. Other: No abdominopelvic free fluid or free gas. No bowel containing hernias. Fat containing left inguinal hernia and small fat containing  umbilical hernia. Musculoskeletal: Multilevel degenerative changes are present in the imaged portions of the spine. 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 There is a new inferior endplate compression deformity at L2 with approximately 40% height loss centrally. No other acute or suspicious osseous lesion IMPRESSION: 1. New inferior endplate compression deformity at L2 not present on comparison exam from August 07, 2018. Approximately 40% height loss centrally. Correlate with point tenderness. 2. Circumferential bladder wall thickening and perivesicular hazy stranding greater than expected for underdistention. Correlate with urinalysis to exclude cystitis. 3. Focal region of mid mesenteric hazy stranding with numerous reactive appearing clustered mid mesenteric lymph nodes compatible with mesenteritis, similar to comparison. 4. Moderate right pleural effusion with adjacent volume loss and opacity favoring atelectasis though infection is not fully excluded. 5. Aortic Atherosclerosis (ICD10-I70.0). Electronically Signed   By: Lovena Le M.D.   On: 10/27/2018 18:46    ____________________________________________    PROCEDURES  Procedure(s) performed:    Procedures    Medications  levofloxacin (LEVAQUIN) IVPB 750 mg (0 mg Intravenous Stopped 10/27/18 1937)  ondansetron (ZOFRAN) injection 4 mg (4 mg Intravenous Given 10/27/18 1801)  sodium chloride 0.9 % bolus 500 mL (500 mLs Intravenous New Bag/Given 10/27/18 2147)     ____________________________________________   INITIAL IMPRESSION / ASSESSMENT AND PLAN / ED COURSE  Pertinent labs & imaging results that were available during my care of the patient were reviewed by me and considered in my medical decision making (see chart for details).  Review of the [place] CSRS was performed in accordance of the [place] prior to dispensing any controlled drugs.           Assessment and Plan: Low back pain 85 year old  male presents to the emergency department with new onset low back pain associated with nausea and fatigue for the past 2 to 3 days.  Patient was satting at 92% at triage and was mildly hypertensive. On physical exam, patient had some paraspinal muscle tenderness to palpation but no significant CVA tenderness.  No abdominal pain to palpation.  Differential diagnosis includes cystitis, pyelonephritis, nephrolithiasis, symptomatic anemia, failure to thrive, worsening end-stage renal disease, CHF exacerbation, sepsis, community-acquired pneumonia...  Patient has significant leukocytosis, 18.9 with associated left shift.  Urinalysis was concerning with nitrates, large amount of leuks hemoglobin.  CT renal stone study revealed no evidence of stone but a L2 endplate fracture was incidentally discovered.  Patient has no radiculopathy and  neuro exam is reassuring.  Lactic was within reference range and vital signs remain reassuring throughout emergency department course.  Patient was given IV Levaquin in the emergency department as well as IV Zofran.  Patient was given 500 mL of supplemental fluids in the emergency department.  Patient case was discussed with attending, Dr. Jimmye Norman who recommended admission.  Patient was accepted for admission by Dr. Marcille Blanco.      ____________________________________________  FINAL CLINICAL IMPRESSION(S) / ED DIAGNOSES  Final diagnoses:  Pyelonephritis      NEW MEDICATIONS STARTED DURING THIS VISIT:  ED Discharge Orders    None          This chart was dictated using voice recognition software/Dragon. Despite best efforts to proofread, errors can occur which can change the meaning. Any change was purely unintentional.    Lannie Fields, PA-C 10/27/18 2154    Earleen Newport, MD 10/27/18 2201

## 2018-10-27 NOTE — ED Triage Notes (Signed)
Pt to ED via POV c/o lower back pain for the past few days. Pt denies any other symptoms at this time.

## 2018-10-28 ENCOUNTER — Inpatient Hospital Stay: Payer: Medicare HMO

## 2018-10-28 ENCOUNTER — Other Ambulatory Visit: Payer: Self-pay

## 2018-10-28 LAB — GLUCOSE, CAPILLARY
Glucose-Capillary: 126 mg/dL — ABNORMAL HIGH (ref 70–99)
Glucose-Capillary: 100 mg/dL — ABNORMAL HIGH (ref 70–99)
Glucose-Capillary: 108 mg/dL — ABNORMAL HIGH (ref 70–99)
Glucose-Capillary: 105 mg/dL — ABNORMAL HIGH (ref 70–99)

## 2018-10-28 LAB — BASIC METABOLIC PANEL
Anion gap: 7 (ref 5–15)
BUN: 30 mg/dL — ABNORMAL HIGH (ref 8–23)
CO2: 24 mmol/L (ref 22–32)
Calcium: 8.1 mg/dL — ABNORMAL LOW (ref 8.9–10.3)
Chloride: 104 mmol/L (ref 98–111)
Creatinine, Ser: 2.36 mg/dL — ABNORMAL HIGH (ref 0.61–1.24)
GFR calc Af Amer: 28 mL/min — ABNORMAL LOW (ref >60)
GFR calc non Af Amer: 25 mL/min — ABNORMAL LOW (ref >60)
Glucose, Bld: 130 mg/dL — ABNORMAL HIGH (ref 70–99)
Potassium: 4 mmol/L (ref 3.5–5.1)
Sodium: 135 mmol/L (ref 135–145)

## 2018-10-28 LAB — HEMOGLOBIN A1C
Hgb A1c MFr Bld: 6.4 % — ABNORMAL HIGH (ref 4.8–5.6)
Mean Plasma Glucose: 136.98 mg/dL

## 2018-10-28 LAB — CBC
HCT: 24.6 % — ABNORMAL LOW (ref 39.0–52.0)
Hemoglobin: 7.8 g/dL — ABNORMAL LOW (ref 13.0–17.0)
MCH: 30.2 pg (ref 26.0–34.0)
MCHC: 31.7 g/dL (ref 30.0–36.0)
MCV: 95.3 fL (ref 80.0–100.0)
Platelets: 300 10*3/uL (ref 150–400)
RBC: 2.58 MIL/uL — ABNORMAL LOW (ref 4.22–5.81)
RDW: 19.8 % — ABNORMAL HIGH (ref 11.5–15.5)
WBC: 15.7 10*3/uL — ABNORMAL HIGH (ref 4.0–10.5)
nRBC: 0.4 % — ABNORMAL HIGH (ref 0.0–0.2)

## 2018-10-28 LAB — SARS CORONAVIRUS 2 (TAT 6-24 HRS): SARS Coronavirus 2: NEGATIVE

## 2018-10-28 MED ORDER — INSULIN ASPART 100 UNIT/ML ~~LOC~~ SOLN
0.0000 [IU] | Freq: Three times a day (TID) | SUBCUTANEOUS | Status: DC
  Administered 2018-10-28: 2 [IU] via SUBCUTANEOUS
  Administered 2018-10-29 (×2): 3 [IU] via SUBCUTANEOUS
  Administered 2018-10-30: 2 [IU] via SUBCUTANEOUS
  Administered 2018-10-30: 3 [IU] via SUBCUTANEOUS
  Administered 2018-11-01: 2 [IU] via SUBCUTANEOUS
  Administered 2018-11-01: 5 [IU] via SUBCUTANEOUS
  Administered 2018-11-02 (×2): 2 [IU] via SUBCUTANEOUS
  Administered 2018-11-03: 3 [IU] via SUBCUTANEOUS
  Administered 2018-11-03: 2 [IU] via SUBCUTANEOUS
  Administered 2018-11-04: 3 [IU] via SUBCUTANEOUS
  Administered 2018-11-04 – 2018-11-05 (×3): 2 [IU] via SUBCUTANEOUS
  Administered 2018-11-05: 3 [IU] via SUBCUTANEOUS
  Filled 2018-10-28 (×16): qty 1

## 2018-10-28 MED ORDER — INSULIN ASPART 100 UNIT/ML ~~LOC~~ SOLN
5.0000 [IU] | Freq: Three times a day (TID) | SUBCUTANEOUS | Status: DC
  Administered 2018-10-28 – 2018-11-05 (×14): 5 [IU] via SUBCUTANEOUS
  Filled 2018-10-28 (×17): qty 1

## 2018-10-28 NOTE — Consult Note (Addendum)
Reason for Consult: L2 compression fracture Referring Physician: Dr. Delfin Edis Arthur [name] is an 85 y.o. male.  HPI: Patient has been having increasingly severe now severe pain in the mid to low back predominantly to the left side but also in the midline.  He does not recall an injury but he may have sat down hard about a week and a half ago.  Family reports that he has not had problems like this in the past.  He is a long-term diabetic and has other medical problems.  He denies loss of bowel or bladder function or gross motor weakness in the lower extremities.  He is unable to get out of bed because of severe back pain  Past Medical History:  Diagnosis Date  . Anemia of chronic kidney failure 08/21/2018  . CHF (congestive heart failure) ([place])   . CKD (chronic kidney disease), stage III   . COPD (chronic obstructive pulmonary disease) ([place])   . Diabetes mellitus without complication ([place])   . Hyperlipidemia   . Hypertension     Past Surgical History:  Procedure Laterality Date  . COLONOSCOPY WITH PROPOFOL N/A 06/11/2018   Procedure: COLONOSCOPY WITH PROPOFOL;  Surgeon: Jonathon Bellows, MD;  Location: [facility] ENDOSCOPY;  Service: Gastroenterology;  Laterality: N/A;  . COLONOSCOPY WITH PROPOFOL N/A 06/13/2018   Procedure: COLONOSCOPY WITH PROPOFOL;  Surgeon: Jonathon Bellows, MD;  Location: [facility] ENDOSCOPY;  Service: Gastroenterology;  Laterality: N/A;  . ESOPHAGOGASTRODUODENOSCOPY Left 06/10/2018   Procedure: ESOPHAGOGASTRODUODENOSCOPY (EGD);  Surgeon: Jonathon Bellows, MD;  Location: [facility] ENDOSCOPY;  Service: Gastroenterology;  Laterality: Left;  . GIVENS CAPSULE STUDY N/A 06/28/2018   Procedure: GIVENS CAPSULE STUDY;  Surgeon: Lucilla Lame, MD;  Location: [facility] ENDOSCOPY;  Service: Endoscopy;  Laterality: N/A;  . GIVENS CAPSULE STUDY N/A 06/30/2018   Procedure: GIVENS CAPSULE STUDY;  Surgeon: Jonathon Bellows, MD;  Location: [facility] ENDOSCOPY;  Service: Gastroenterology;  Laterality: N/A;    Family History  Problem  Relation Age of Onset  . COPD Mother   . Cancer Father     Social History:  reports that he quit smoking about 36 years ago. His smoking use included cigarettes. He has a 70.00 pack-year smoking history. He uses smokeless tobacco. He reports previous alcohol use. He reports that he does not use drugs.  Allergies:  Allergies  Allergen Reactions  . Codeine Other (See Comments)    Constipation    Medications: I have reviewed the patient's current medications.  Results for orders placed or performed during the hospital encounter of 10/27/18 (from the past 48 hour(s))  CBC with Differential     Status: Abnormal   Collection Time: 10/27/18  3:50 PM  Result Value Ref Range   WBC 18.9 (H) 4.0 - 10.5 K/uL   RBC 3.01 (L) 4.22 - 5.81 MIL/uL   Hemoglobin 9.1 (L) 13.0 - 17.0 g/dL   HCT 29.1 (L) 39.0 - 52.0 %   MCV 96.7 80.0 - 100.0 fL   MCH 30.2 26.0 - 34.0 pg   MCHC 31.3 30.0 - 36.0 g/dL   RDW 19.8 (H) 11.5 - 15.5 %   Platelets 390 150 - 400 K/uL   nRBC 0.4 (H) 0.0 - 0.2 %   Neutrophils Relative % 87 %   Neutro Abs 16.6 (H) 1.7 - 7.7 K/uL   Lymphocytes Relative 5 %   Lymphs Abs 0.9 0.7 - 4.0 K/uL   Monocytes Relative 6 %   Monocytes Absolute 1.1 (H) 0.1 - 1.0 K/uL   Eosinophils Relative  0 %   Eosinophils Absolute 0.0 0.0 - 0.5 K/uL   Basophils Relative 1 %   Basophils Absolute 0.1 0.0 - 0.1 K/uL   Immature Granulocytes 1 %   Abs Immature Granulocytes 0.12 (H) 0.00 - 0.07 K/uL    Comment: Performed at [facility], [address] [place], [place] [address]  Comprehensive metabolic panel     Status: Abnormal   Collection Time: 10/27/18  3:50 PM  Result Value Ref Range   Sodium 135 135 - 145 mmol/L   Potassium 4.3 3.5 - 5.1 mmol/L   Chloride 100 98 - 111 mmol/L   CO2 22 22 - 32 mmol/L   Glucose, Bld 160 (H) 70 - 99 mg/dL   BUN 33 (H) 8 - 23 mg/dL   Creatinine, Ser 2.41 (H) 0.61 - 1.24 mg/dL   Calcium 8.7 (L) 8.9 - 10.3 mg/dL   Total Protein 7.4 6.5 - 8.1 g/dL    Albumin 3.9 3.5 - 5.0 g/dL   AST 15 15 - 41 U/L   ALT 13 0 - 44 U/L   Alkaline Phosphatase 69 38 - 126 U/L   Total Bilirubin 1.0 0.3 - 1.2 mg/dL   GFR calc non Af Amer 24 (L) >60 mL/min   GFR calc Af Amer 28 (L) >60 mL/min   Anion gap 13 5 - 15    Comment: Performed at [facility], [address], [place], [place] [address]  Urinalysis, Complete w Microscopic     Status: Abnormal   Collection Time: 10/27/18  3:50 PM  Result Value Ref Range   Color, Urine YELLOW (A) YELLOW   APPearance CLOUDY (A) CLEAR   Specific Gravity, Urine 1.012 1.005 - 1.030   pH 5.0 5.0 - 8.0   Glucose, UA NEGATIVE NEGATIVE mg/dL   Hgb urine dipstick MODERATE (A) NEGATIVE   Bilirubin Urine NEGATIVE NEGATIVE   Ketones, ur 5 (A) NEGATIVE mg/dL   Protein, ur 100 (A) NEGATIVE mg/dL   Nitrite POSITIVE (A) NEGATIVE   Leukocytes,Ua LARGE (A) NEGATIVE   RBC / HPF 21-50 0 - 5 RBC/hpf   WBC, UA >50 (H) 0 - 5 WBC/hpf   Bacteria, UA NONE SEEN NONE SEEN   Squamous Epithelial / LPF NONE SEEN 0 - 5   WBC Clumps PRESENT     Comment: Performed at [facility], [address] [place], [place] [address]  Lipase, blood     Status: None   Collection Time: 10/27/18  3:50 PM  Result Value Ref Range   Lipase 17 11 - 51 U/L    Comment: Performed at [facility], [address] [place], [place] [address]  Lactic acid, plasma     Status: None   Collection Time: 10/27/18  3:50 PM  Result Value Ref Range   Lactic Acid, Venous 1.2 0.5 - 1.9 mmol/L    Comment: Performed at [facility], [address] [place], [place] [address]  Urine culture     Status: None (Preliminary result)   Collection Time: 10/27/18  3:50 PM   Specimen: Urine, Random  Result Value Ref Range   Specimen Description      URINE, RANDOM Performed at [facility], [address], [place], [place] [address]    Special Requests      NONE Performed at [facility], [address], [place], [place]  [address]    Culture PENDING    Report Status PENDING   SARS CORONAVIRUS 2 (  TAT 6-24 HRS) Nasopharyngeal Nasopharyngeal Swab     Status: None   Collection Time: 10/27/18  8:27 PM   Specimen: Nasopharyngeal Swab  Result Value Ref Range   SARS Coronavirus 2 NEGATIVE NEGATIVE    Comment: (NOTE) SARS-CoV-2 target nucleic acids are NOT DETECTED. The SARS-CoV-2 RNA is generally detectable in upper and lower respiratory specimens during the acute phase of infection. Negative results do not preclude SARS-CoV-2 infection, do not rule out co-infections with other pathogens, and should not be used as the sole basis for treatment or other patient management decisions. Negative results must be combined with clinical observations, patient history, and epidemiological information. The expected result is Negative. Fact Sheet for Patients: SugarRoll.be Fact Sheet for Healthcare Providers: https://www.woods-mathews.com/ This test is not yet approved or cleared by the [place] FDA and  has been authorized for detection and/or diagnosis of SARS-CoV-2 by FDA under an Emergency Use Authorization (EUA). This EUA will remain  in effect (meaning this test can be used) for the duration of the COVID-19 declaration under Section 56 4(b)(1) of the Act, 21 U.S.C. section 360bbb-3(b)(1), unless the authorization is terminated or revoked sooner. Performed at [facility], [place] [address], [place], [place] [address]   Basic metabolic panel     Status: Abnormal   Collection Time: 10/28/18  5:00 AM  Result Value Ref Range   Sodium 135 135 - 145 mmol/L   Potassium 4.0 3.5 - 5.1 mmol/L   Chloride 104 98 - 111 mmol/L   CO2 24 22 - 32 mmol/L   Glucose, Bld 130 (H) 70 - 99 mg/dL   BUN 30 (H) 8 - 23 mg/dL   Creatinine, Ser 2.36 (H) 0.61 - 1.24 mg/dL   Calcium 8.1 (L) 8.9 - 10.3 mg/dL   GFR calc non Af Amer 25 (L) >60 mL/min   GFR calc Af Amer 28 (L) >60 mL/min    Anion gap 7 5 - 15    Comment: Performed at [facility], [address] [place], [place] [address]  CBC     Status: Abnormal   Collection Time: 10/28/18  5:00 AM  Result Value Ref Range   WBC 15.7 (H) 4.0 - 10.5 K/uL   RBC 2.58 (L) 4.22 - 5.81 MIL/uL   Hemoglobin 7.8 (L) 13.0 - 17.0 g/dL   HCT 24.6 (L) 39.0 - 52.0 %   MCV 95.3 80.0 - 100.0 fL   MCH 30.2 26.0 - 34.0 pg   MCHC 31.7 30.0 - 36.0 g/dL   RDW 19.8 (H) 11.5 - 15.5 %   Platelets 300 150 - 400 K/uL   nRBC 0.4 (H) 0.0 - 0.2 %    Comment: Performed at [facility], [address] [place],  [address]  Hemoglobin A1c     Status: Abnormal   Collection Time: 10/28/18  5:00 AM  Result Value Ref Range   Hgb A1c MFr Bld 6.4 (H) 4.8 - 5.6 %    Comment: (NOTE) Pre diabetes:          5.7%-6.4% Diabetes:              >6.4% Glycemic control for   <7.0% adults with diabetes    Mean Plasma Glucose 136.98 mg/dL    Comment: Performed at [facility], [place] [address] [place], [place] [address]  Glucose, capillary     Status: Abnormal   Collection Time: 10/28/18  7:44 AM  Result Value Ref Range   Glucose-Capillary 126 (  H) 70 - 99 mg/dL  Glucose, capillary     Status: Abnormal   Collection Time: 10/28/18 11:30 AM  Result Value Ref Range   Glucose-Capillary 100 (H) 70 - 99 mg/dL  Glucose, capillary     Status: Abnormal   Collection Time: 10/28/18  5:10 PM  Result Value Ref Range   Glucose-Capillary 108 (H) 70 - 99 mg/dL    Dg Orbits  Result Date: 10/28/2018 CLINICAL DATA:  Metal working/exposure; clearance prior to MRI EXAM: ORBITS - COMPLETE 4+ VIEW COMPARISON:  None. FINDINGS: There is no evidence of metallic foreign body within the orbits. No significant bone abnormality identified. IMPRESSION: No evidence of metallic foreign body within the orbits. Electronically Signed   By: Marijo Conception M.D.   On: 10/28/2018 15:27   Dg Chest 2 View  Result Date: 10/27/2018 CLINICAL DATA:  Cough and pain  EXAM: CHEST - 2 VIEW COMPARISON:  August 08, 2018 FINDINGS: There is a fairly small pleural effusion with atelectatic change in the right base. Lungs elsewhere are clear. Heart size and pulmonary vascularity are normal. No adenopathy. Bones are osteoporotic. IMPRESSION: Fairly small right pleural effusion with right base atelectasis. No edema or consolidation. Cardiac silhouette stable from prior study. No adenopathy evident. Electronically Signed   By: Lowella Grip III M.D.   On: 10/27/2018 15:46   Mr Lumbar Spine Wo Contrast  Result Date: 10/28/2018 CLINICAL DATA:  Acute bilateral low back pain for 2-3 days, nausea, and fatigue. L2 compression fracture on CT. EXAM: MRI LUMBAR SPINE WITHOUT CONTRAST TECHNIQUE: Multiplanar, multisequence MR imaging of the lumbar spine was performed. No intravenous contrast was administered. COMPARISON:  CT abdomen and pelvis 10/27/2018 FINDINGS: Segmentation: There is transitional lumbosacral anatomy. For consistency with the recent CT report, the transitional segment will be considered a partially sacralized L5. Alignment: Slight right convex curvature of the lumbar spine. No significant listhesis. Vertebrae: L2 inferior endplate compression fracture with 30% vertebral body height loss centrally and mild marrow edema along the inferior endplate without retropulsion. Degenerative endplate marrow changes at multiple levels, most notably L1-2 and L4-5. Nonspecific diffuse bone marrow heterogeneity without suspicious focal lesion identified. Conus medullaris and cauda equina: Conus extends to the upper T12 level. Conus and cauda equina appear normal. Paraspinal and other soft tissues: Mild left-sided paravertebral soft tissue edema at L2-3 adjacent to the compression fracture. Disc levels: Disc desiccation from L1-2 to L4-5. Mild disc space narrowing at L1-2 and moderate to severe narrowing at L4-5. T12-L1: Minimal facet arthrosis without disc herniation or stenosis. L1-2:  Mild circumferential disc bulging and mild facet and ligamentum flavum hypertrophy without significant stenosis. L2-3: Mild disc bulging and moderate facet and ligamentum flavum hypertrophy without significant stenosis. L3-4: Mild disc bulging, moderate facet and ligamentum flavum hypertrophy, and congenitally short pedicles result in moderate spinal stenosis and minimal left neural foraminal stenosis. L4-5: Prominent right-sided ventral epidural fat extending into the right neural foramen with remodeling of the posterior L4 vertebral body and pedicle. Disc bulging, congenitally short pedicles, and moderate to severe facet and ligamentum flavum hypertrophy result in mild-to-moderate spinal stenosis and mild left neural foraminal stenosis. L5-S1: Transitional anatomy. No stenosis. IMPRESSION: 1. Transitional lumbosacral anatomy as above. 2. Acute to subacute L2 compression fracture with 30% height loss. 3. Moderate multifactorial spinal stenosis at L3-4. 4. Mild-to-moderate spinal stenosis at L4-5. Electronically Signed   By: Logan Bores M.D.   On: 10/28/2018 16:54   Ct Renal Stone Study  Result Date: 10/27/2018 CLINICAL  DATA:  Lower back pain for several days, stone disease suspected EXAM: CT ABDOMEN AND PELVIS WITHOUT CONTRAST TECHNIQUE: Multidetector CT imaging of the abdomen and pelvis was performed following the standard protocol without IV contrast. COMPARISON:  CT chest 10/07/2018, CT abdomen pelvis August 07, 2018 FINDINGS: Lower chest: Moderate right pleural effusion with adjacent volume loss and opacity favoring atelectasis though infection is not fully excluded. Mild left basilar atelectasis. Upper limits normal size heart. No pericardial effusion. Coronary artery calcification is seen. Calcifications are present on the aortic leaflets. Hepatobiliary: Redemonstration of 1.9 cm peripheral E calcified gallstone in the gallbladder. No pericholecystic inflammation or biliary ductal dilatation. No focal  hepatic lesions or hepatic surface contour irregularity. Pancreas: Unremarkable. No pancreatic ductal dilatation or surrounding inflammatory changes. Spleen: Normal in size without focal abnormality. Adrenals/Urinary Tract: Nodular thickening of the adrenal glands is similar to prior and may reflect senescent change. No visible or contour deforming renal lesions. No urolithiasis. No hydronephrosis. Mild nonspecific bilateral perinephric stranding is similar to comparison exam. There is circumferential bladder wall thickening and perivesicular hazy stranding greater than expected for underdistention. Stomach/Bowel: Distal esophagus, stomach and duodenal sweep are unremarkable. No small bowel wall thickening or dilatation. No evidence of obstruction. A normal appendix is visualized. No colonic dilatation or wall thickening. Moderate stool burden. Vascular/Lymphatic: Extensive atherosclerotic calcification of the aorta and branch vessels. Luminal evaluation limited in the absence of contrast media. Focal region of mid mesenteric hazy stranding with numerous reactive appearing clustered mid mesenteric lymph nodes compatible with mesenteritis (2/47). Reproductive: Enlarged prostate. Question a keyhole defect in the central prostate which may reflect prior TURP. Seminal vesicles are symmetric. Other: No abdominopelvic free fluid or free gas. No bowel containing hernias. Fat containing left inguinal hernia and small fat containing umbilical hernia. Musculoskeletal: Multilevel degenerative changes are present in the imaged portions of the spine. 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 There is a new inferior endplate compression deformity at L2 with approximately 40% height loss centrally. No other acute or suspicious osseous lesion IMPRESSION: 1. New inferior endplate compression deformity at L2 not present on comparison exam from August 07, 2018. Approximately 40% height loss centrally.  Correlate with point tenderness. 2. Circumferential bladder wall thickening and perivesicular hazy stranding greater than expected for underdistention. Correlate with urinalysis to exclude cystitis. 3. Focal region of mid mesenteric hazy stranding with numerous reactive appearing clustered mid mesenteric lymph nodes compatible with mesenteritis, similar to comparison. 4. Moderate right pleural effusion with adjacent volume loss and opacity favoring atelectasis though infection is not fully excluded. 5. Aortic Atherosclerosis (ICD10-I70.0). Electronically Signed   By: Lovena Le M.D.   On: 10/27/2018 18:46    ROS Blood pressure (!) 114/54, pulse 62, temperature 98.2 F (36.8 C), temperature source Oral, resp. rate 20, height 5\' 8"  (1.727 m), weight 78.4 kg, SpO2 93 %. Physical Exam he has no clonus on exam he is tender at the L2 level with slight kyphosis palpated.  Skin is intact. MRI and x-rays reviewed showing subacute L2 inferior compression fracture with significant height loss in the central aspect of the L2 vertebral body along with lower levels of spinal stenosis. Assessment/Plan: L2 compression fracture causing severe pain and inability to ambulate Recommendation is for L2 kyphoplasty.  We will discuss this with him again further tomorrow and if possible get on surgery schedule Wednesday. PT has been ordered for him and if he can get around with therapy may not require surgery but I suspect he  is will be unable to and but we can give it a try. [name] 10/28/2018, 6:22 PM

## 2018-10-28 NOTE — H&P (Signed)
[facility] [place] NAME: Arthur [name]    MR#:  [id number]  DATE OF BIRTH:  05/09/1933  DATE OF ADMISSION:  10/27/2018  PRIMARY CARE PHYSICIAN: Baxter Hire, MD   REQUESTING/REFERRING PHYSICIAN: Vallarie Mare, PA-C CHIEF COMPLAINT:   Chief Complaint  Patient presents with   Back Pain    HISTORY OF PRESENT ILLNESS:  Arthur [name]  is a 85 y.o.male with a known history of CHF, stage III-IV chronic kidney disease, diabetes mellitus, COPD and hypertension, who presented to the emergency room with acute onset of back pain with urinary urgency and foul-smelling urine.  No dysuria or hematuria.  He admitted to nausea without vomiting or abdominal pain.  He he admits to feeling cold but has not had any measured fever at home.  No cough or wheezing or shortness of breath.  No recent sick exposures.  Upon presentation to the emergency room, vital signs were within normal except for temperature of 99.  Labs revealed a BUN of 33 and creatinine of 2.41 close to previous levels with stage III-IV chronic kidney disease, lactic acid 1.2 and significant leukocytosis of 18.9 with neutrophilia as well as anemia better than previous levels.  UA was positive for UTI.  Urine culture was sent abdominal CT scan revealed L2 compression fracture and signs of cystitis and mesenteritis as well as right pleural effusion with atelectasis as mentioned below  The patient was given 750 mg of IV Levaquin, 4 mg of IV Zofran and 500 mL of a normal saline bolus.  He will be admitted to medical monitored bed for further evaluation and management. PAST MEDICAL HISTORY:   Past Medical History:  Diagnosis Date   Anemia of chronic kidney failure 08/21/2018   CHF (congestive heart failure) (HCC)    CKD (chronic kidney disease), stage III    COPD (chronic obstructive pulmonary disease) ([place])    Diabetes mellitus without complication ([place])    Hyperlipidemia    Hypertension      PAST SURGICAL HISTORY:   Past Surgical History:  Procedure Laterality Date   COLONOSCOPY WITH PROPOFOL N/A 06/11/2018   Procedure: COLONOSCOPY WITH PROPOFOL;  Surgeon: Jonathon Bellows, MD;  Location: [facility] ENDOSCOPY;  Service: Gastroenterology;  Laterality: N/A;   COLONOSCOPY WITH PROPOFOL N/A 06/13/2018   Procedure: COLONOSCOPY WITH PROPOFOL;  Surgeon: Jonathon Bellows, MD;  Location: [facility] ENDOSCOPY;  Service: Gastroenterology;  Laterality: N/A;   ESOPHAGOGASTRODUODENOSCOPY Left 06/10/2018   Procedure: ESOPHAGOGASTRODUODENOSCOPY (EGD);  Surgeon: Jonathon Bellows, MD;  Location: [facility] ENDOSCOPY;  Service: Gastroenterology;  Laterality: Left;   GIVENS CAPSULE STUDY N/A 06/28/2018   Procedure: GIVENS CAPSULE STUDY;  Surgeon: Lucilla Lame, MD;  Location: [facility] ENDOSCOPY;  Service: Endoscopy;  Laterality: N/A;   GIVENS CAPSULE STUDY N/A 06/30/2018   Procedure: GIVENS CAPSULE STUDY;  Surgeon: Jonathon Bellows, MD;  Location: [facility] ENDOSCOPY;  Service: Gastroenterology;  Laterality: N/A;    SOCIAL HISTORY:   Social History   Tobacco Use   Smoking status: Former Smoker    Packs/day: 2.00    Years: 35.00    Pack years: 70.00    Types: Cigarettes    Quit date: 11/04/1982    Years since quitting: 36.0   Smokeless tobacco: Current User  Substance Use Topics   Alcohol use: Not Currently    FAMILY HISTORY:   Family History  Problem Relation Age of Onset   COPD Mother    Cancer Father     DRUG ALLERGIES:   Allergies  Allergen Reactions   Codeine Other (See Comments)    Constipation    REVIEW OF SYSTEMS:   ROS As per history of present illness. All pertinent systems were reviewed above. Constitutional,  HEENT, cardiovascular, respiratory, GI, GU, musculoskeletal, neuro, psychiatric, endocrine,  integumentary and hematologic systems were reviewed and are otherwise  negative/unremarkable except for positive findings mentioned above in the HPI.   MEDICATIONS AT HOME:   Prior to Admission  medications   Medication Sig Start Date End Date Taking? Authorizing Provider  albuterol (VENTOLIN HFA) 108 (90 Base) MCG/ACT inhaler Inhale 2 puffs into the lungs every 6 (six) hours as needed for wheezing or shortness of breath. 05/27/18  Yes Demetrios Loll, MD  amLODipine (NORVASC) 2.5 MG tablet Take 2.5 mg by mouth daily. 04/26/18  Yes [provider]  insulin aspart (NOVOLOG) 100 UNIT/ML injection Inject 15 Units into the skin 3 (three) times daily with meals.   Yes [provider]  ipratropium-albuterol (DUONEB) 0.5-2.5 (3) MG/3ML SOLN Take 3 mLs by nebulization every 6 (six) hours as needed. 05/28/18  Yes Bettey Costa, MD  iron polysaccharides (NIFEREX) 150 MG capsule Take 1 capsule (150 mg total) by mouth daily. 07/11/18  Yes Fritzi Mandes, MD  LEVEMIR 100 UNIT/ML injection Inject 0.12 mLs (12 Units total) into the skin daily. Patient taking differently: Inject 30 Units into the skin daily.  07/10/18  Yes Fritzi Mandes, MD  nystatin cream (MYCOSTATIN) Apply topically 2 (two) times daily. 05/28/18  Yes Mody, Ulice Bold, MD  pantoprazole (PROTONIX) 40 MG tablet Take 1 tablet (40 mg total) by mouth 2 (two) times daily. 06/14/18  Yes Lang Snow, NP  potassium chloride (K-DUR) 10 MEQ tablet Take 10 mEq by mouth daily.  08/20/18 08/20/19 Yes [provider]  pravastatin (PRAVACHOL) 10 MG tablet Take 10 mg by mouth at bedtime. 04/25/18  Yes [provider]  tamsulosin (FLOMAX) 0.4 MG CAPS capsule Take 0.4 mg by mouth daily. 04/25/18  Yes [provider]  torsemide (DEMADEX) 20 MG tablet Take 20 mg by mouth every other day.    Yes Baxter Hire, MD  vitamin C (ASCORBIC ACID) 500 MG tablet Take 1 tablet (500 mg total) by mouth daily. Patient not taking: Reported on 10/27/2018 10/22/18   Earlie Server, MD      VITAL SIGNS:  Blood pressure (!) 140/50, pulse 73, temperature 98.6 F (37 C), temperature source Oral, resp. rate 17, height 5\' 8"  (1.727 m), weight 102.5 kg,  SpO2 92 %.  PHYSICAL EXAMINATION:  Physical Exam  GENERAL:  85 y.o.-year-old Caucasian male patient lying in the bed with no acute distress.  EYES: Pupils equal, round, reactive to light and accommodation. No scleral icterus. Extraocular muscles intact.  HEENT: Head atraumatic, normocephalic. Oropharynx and nasopharynx clear.  NECK:  Supple, no jugular venous distention. No thyroid enlargement, no tenderness.  LUNGS: Normal breath sounds bilaterally, no wheezing, rales,rhonchi or crepitation. No use of accessory muscles of respiration.  CARDIOVASCULAR: Regular rate and rhythm, S1, S2 normal. No murmurs, rubs, or gallops.  ABDOMEN: Soft, nondistended, nontender. Bowel sounds present. No organomegaly or mass.  EXTREMITIES: No pedal edema, cyanosis, or clubbing.  NEUROLOGIC: Cranial nerves II through XII are intact. Muscle strength 5/5 in all extremities. Sensation intact. Gait not checked.  PSYCHIATRIC: The patient is alert and oriented x 3.  Normal affect and good eye contact. SKIN: No obvious rash, lesion, or ulcer.   LABORATORY PANEL:   CBC Recent Labs  Lab 10/27/18 1550  WBC  18.9*  HGB 9.1*  HCT 29.1*  PLT 390   ------------------------------------------------------------------------------------------------------------------  Chemistries  Recent Labs  Lab 10/27/18 1550  NA 135  K 4.3  CL 100  CO2 22  GLUCOSE 160*  BUN 33*  CREATININE 2.41*  CALCIUM 8.7*  AST 15  ALT 13  ALKPHOS 69  BILITOT 1.0   ------------------------------------------------------------------------------------------------------------------  Cardiac Enzymes No results for input(s): TROPONINI in the last 168 hours. ------------------------------------------------------------------------------------------------------------------  RADIOLOGY:  Dg Chest 2 View  Result Date: 10/27/2018 CLINICAL DATA:  Cough and pain EXAM: CHEST - 2 VIEW COMPARISON:  August 08, 2018 FINDINGS: There is a fairly  small pleural effusion with atelectatic change in the right base. Lungs elsewhere are clear. Heart size and pulmonary vascularity are normal. No adenopathy. Bones are osteoporotic. IMPRESSION: Fairly small right pleural effusion with right base atelectasis. No edema or consolidation. Cardiac silhouette stable from prior study. No adenopathy evident. Electronically Signed   By: Lowella Grip III M.D.   On: 10/27/2018 15:46   Ct Renal Stone Study  Result Date: 10/27/2018 CLINICAL DATA:  Lower back pain for several days, stone disease suspected EXAM: CT ABDOMEN AND PELVIS WITHOUT CONTRAST TECHNIQUE: Multidetector CT imaging of the abdomen and pelvis was performed following the standard protocol without IV contrast. COMPARISON:  CT chest 10/07/2018, CT abdomen pelvis August 07, 2018 FINDINGS: Lower chest: Moderate right pleural effusion with adjacent volume loss and opacity favoring atelectasis though infection is not fully excluded. Mild left basilar atelectasis. Upper limits normal size heart. No pericardial effusion. Coronary artery calcification is seen. Calcifications are present on the aortic leaflets. Hepatobiliary: Redemonstration of 1.9 cm peripheral E calcified gallstone in the gallbladder. No pericholecystic inflammation or biliary ductal dilatation. No focal hepatic lesions or hepatic surface contour irregularity. Pancreas: Unremarkable. No pancreatic ductal dilatation or surrounding inflammatory changes. Spleen: Normal in size without focal abnormality. Adrenals/Urinary Tract: Nodular thickening of the adrenal glands is similar to prior and may reflect senescent change. No visible or contour deforming renal lesions. No urolithiasis. No hydronephrosis. Mild nonspecific bilateral perinephric stranding is similar to comparison exam. There is circumferential bladder wall thickening and perivesicular hazy stranding greater than expected for underdistention. Stomach/Bowel: Distal esophagus, stomach and  duodenal sweep are unremarkable. No small bowel wall thickening or dilatation. No evidence of obstruction. A normal appendix is visualized. No colonic dilatation or wall thickening. Moderate stool burden. Vascular/Lymphatic: Extensive atherosclerotic calcification of the aorta and branch vessels. Luminal evaluation limited in the absence of contrast media. Focal region of mid mesenteric hazy stranding with numerous reactive appearing clustered mid mesenteric lymph nodes compatible with mesenteritis (2/47). Reproductive: Enlarged prostate. Question a keyhole defect in the central prostate which may reflect prior TURP. Seminal vesicles are symmetric. Other: No abdominopelvic free fluid or free gas. No bowel containing hernias. Fat containing left inguinal hernia and small fat containing umbilical hernia. Musculoskeletal: Multilevel degenerative changes are present in the imaged portions of the spine. 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 There is a new inferior endplate compression deformity at L2 with approximately 40% height loss centrally. No other acute or suspicious osseous lesion IMPRESSION: 1. New inferior endplate compression deformity at L2 not present on comparison exam from August 07, 2018. Approximately 40% height loss centrally. Correlate with point tenderness. 2. Circumferential bladder wall thickening and perivesicular hazy stranding greater than expected for underdistention. Correlate with urinalysis to exclude cystitis. 3. Focal region of mid mesenteric hazy stranding with numerous reactive appearing clustered mid mesenteric lymph nodes compatible with  mesenteritis, similar to comparison. 4. Moderate right pleural effusion with adjacent volume loss and opacity favoring atelectasis though infection is not fully excluded. 5. Aortic Atherosclerosis (ICD10-I70.0). Electronically Signed   By: Lovena Le M.D.   On: 10/27/2018 18:46      IMPRESSION AND PLAN:   1.  UTI  with subsequent early sepsis.  The patient will be admitted to medical monitored bed.  We will continue antibiotic therapy with IV Rocephin.  Will follow urine culture and sensitivity as well as blood cultures..  2.  Hypertension we will continue amlodipine.  3.  Type 2 diabetes mellitus.  We will place the patient on supplemental coverage with NovoLog and continue basal coverage.  4.  GERD.  PPI therapy will be resumed.  5.  BPH.  Flomax will be resumed.  6.  COPD.  He will be continued on his albuterol.  7.  DVT prophylaxis.  Subcutaneous Lovenox.  All the records are reviewed and case discussed with ED provider. The plan of care was discussed in details with the patient (and family). I answered all questions. The patient agreed to proceed with the above mentioned plan. Further management will depend upon hospital course.   CODE STATUS: Full code  TOTAL TIME TAKING CARE OF THIS PATIENT: Approximately 50 minutes.    Christel Mormon M.D on 10/28/2018 at 1:41 AM  Pager - [phone]  After 6pm go to [email or web address] - Proofreader  [facility]  Office  [phone]  CC: Primary care physician; Baxter Hire, MD   Note: This dictation was prepared with Dragon dictation along with smaller phrase technology. Any transcriptional errors that result from this process are unintentional.

## 2018-10-28 NOTE — Progress Notes (Signed)
[place] [place] NAME: Arthur [name]    MR#:  [id number]  DATE OF BIRTH:  Jan 27, 1933  SUBJECTIVE:  CHIEF COMPLAINT:   Chief Complaint  Patient presents with  . Back Pain   Continues to have severe low back pain with minimal movement. Afebrile.  REVIEW OF SYSTEMS:    Review of Systems  Constitutional: Positive for chills and malaise/fatigue. Negative for fever.  HENT: Negative for sore throat.   Eyes: Negative for blurred vision, double vision and pain.  Respiratory: Negative for cough, hemoptysis, shortness of breath and wheezing.   Cardiovascular: Negative for chest pain, palpitations, orthopnea and leg swelling.  Gastrointestinal: Negative for abdominal pain, constipation, diarrhea, heartburn, nausea and vomiting.  Genitourinary: Negative for dysuria and hematuria.  Musculoskeletal: Positive for back pain. Negative for joint pain.  Skin: Negative for rash.  Neurological: Negative for sensory change, speech change, focal weakness and headaches.  Endo/Heme/Allergies: Does not bruise/bleed easily.  Psychiatric/Behavioral: Negative for depression. The patient is not nervous/anxious.     DRUG ALLERGIES:   Allergies  Allergen Reactions  . Codeine Other (See Comments)    Constipation    VITALS:  Blood pressure (!) 114/54, pulse 62, temperature 98.2 F (36.8 C), temperature source Oral, resp. rate 20, height 5\' 8"  (1.727 m), weight 78.4 kg, SpO2 93 %.  PHYSICAL EXAMINATION:   Physical Exam  GENERAL:  85 y.o.-year-old patient lying in the bed with no acute distress.  EYES: Pupils equal, round, reactive to light and accommodation. No scleral icterus. Extraocular muscles intact.  HEENT: Head atraumatic, normocephalic. Oropharynx and nasopharynx clear.  NECK:  Supple, no jugular venous distention. No thyroid enlargement, no tenderness.  LUNGS: Normal breath sounds bilaterally, no wheezing, rales, rhonchi. No use of accessory muscles  of respiration.  CARDIOVASCULAR: S1, S2 normal. No murmurs, rubs, or gallops.  ABDOMEN: Soft, nontender, nondistended. Bowel sounds present. No organomegaly or mass.  EXTREMITIES: No cyanosis, clubbing or edema b/l.    NEUROLOGIC: Cranial nerves II through XII are intact. No focal Motor or sensory deficits b/l.   PSYCHIATRIC: The patient is alert and oriented x 3.  SKIN: No obvious rash, lesion, or ulcer.   LABORATORY PANEL:   CBC Recent Labs  Lab 10/28/18 0500  WBC 15.7*  HGB 7.8*  HCT 24.6*  PLT 300   ------------------------------------------------------------------------------------------------------------------ Chemistries  Recent Labs  Lab 10/27/18 1550 10/28/18 0500  NA 135 135  K 4.3 4.0  CL 100 104  CO2 22 24  GLUCOSE 160* 130*  BUN 33* 30*  CREATININE 2.41* 2.36*  CALCIUM 8.7* 8.1*  AST 15  --   ALT 13  --   ALKPHOS 69  --   BILITOT 1.0  --    ------------------------------------------------------------------------------------------------------------------  Cardiac Enzymes No results for input(s): TROPONINI in the last 168 hours. ------------------------------------------------------------------------------------------------------------------  RADIOLOGY:  Dg Chest 2 View  Result Date: 10/27/2018 CLINICAL DATA:  Cough and pain EXAM: CHEST - 2 VIEW COMPARISON:  August 08, 2018 FINDINGS: There is a fairly small pleural effusion with atelectatic change in the right base. Lungs elsewhere are clear. Heart size and pulmonary vascularity are normal. No adenopathy. Bones are osteoporotic. IMPRESSION: Fairly small right pleural effusion with right base atelectasis. No edema or consolidation. Cardiac silhouette stable from prior study. No adenopathy evident. Electronically Signed   By: Lowella Grip III M.D.   On: 10/27/2018 15:46   Ct Renal Stone Study  Result Date: 10/27/2018 CLINICAL DATA:  Lower back pain  for several days, stone disease suspected EXAM: CT  ABDOMEN AND PELVIS WITHOUT CONTRAST TECHNIQUE: Multidetector CT imaging of the abdomen and pelvis was performed following the standard protocol without IV contrast. COMPARISON:  CT chest 10/07/2018, CT abdomen pelvis August 07, 2018 FINDINGS: Lower chest: Moderate right pleural effusion with adjacent volume loss and opacity favoring atelectasis though infection is not fully excluded. Mild left basilar atelectasis. Upper limits normal size heart. No pericardial effusion. Coronary artery calcification is seen. Calcifications are present on the aortic leaflets. Hepatobiliary: Redemonstration of 1.9 cm peripheral E calcified gallstone in the gallbladder. No pericholecystic inflammation or biliary ductal dilatation. No focal hepatic lesions or hepatic surface contour irregularity. Pancreas: Unremarkable. No pancreatic ductal dilatation or surrounding inflammatory changes. Spleen: Normal in size without focal abnormality. Adrenals/Urinary Tract: Nodular thickening of the adrenal glands is similar to prior and may reflect senescent change. No visible or contour deforming renal lesions. No urolithiasis. No hydronephrosis. Mild nonspecific bilateral perinephric stranding is similar to comparison exam. There is circumferential bladder wall thickening and perivesicular hazy stranding greater than expected for underdistention. Stomach/Bowel: Distal esophagus, stomach and duodenal sweep are unremarkable. No small bowel wall thickening or dilatation. No evidence of obstruction. A normal appendix is visualized. No colonic dilatation or wall thickening. Moderate stool burden. Vascular/Lymphatic: Extensive atherosclerotic calcification of the aorta and branch vessels. Luminal evaluation limited in the absence of contrast media. Focal region of mid mesenteric hazy stranding with numerous reactive appearing clustered mid mesenteric lymph nodes compatible with mesenteritis (2/47). Reproductive: Enlarged prostate. Question a keyhole  defect in the central prostate which may reflect prior TURP. Seminal vesicles are symmetric. Other: No abdominopelvic free fluid or free gas. No bowel containing hernias. Fat containing left inguinal hernia and small fat containing umbilical hernia. Musculoskeletal: Multilevel degenerative changes are present in the imaged portions of the spine. 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 There is a new inferior endplate compression deformity at L2 with approximately 40% height loss centrally. No other acute or suspicious osseous lesion IMPRESSION: 1. New inferior endplate compression deformity at L2 not present on comparison exam from August 07, 2018. Approximately 40% height loss centrally. Correlate with point tenderness. 2. Circumferential bladder wall thickening and perivesicular hazy stranding greater than expected for underdistention. Correlate with urinalysis to exclude cystitis. 3. Focal region of mid mesenteric hazy stranding with numerous reactive appearing clustered mid mesenteric lymph nodes compatible with mesenteritis, similar to comparison. 4. Moderate right pleural effusion with adjacent volume loss and opacity favoring atelectasis though infection is not fully excluded. 5. Aortic Atherosclerosis (ICD10-I70.0). Electronically Signed   By: Lovena Le M.D.   On: 10/27/2018 18:46     ASSESSMENT AND PLAN:   * UTI with  sepsis.  Urine cultures pending.  Started on IV ceftriaxone. Sepsis has resolved. WBC improving  * L2 compression fracture with intractable pain Consulted Dr. Rudene Christians.  Message sent.  * Hypertension  continue amlodipine.  *  Type 2 diabetes Basal insulin coverage and sliding scale insulin  *  BPH.  Flomax will be resumed.  *  COPD.    Nebulizers as needed  *  DVT prophylaxis.  Subcutaneous Lovenox.  All the records are reviewed and case discussed with Care Management/Social Worker Management plans discussed with the patient, family  and they are in agreement.  CODE STATUS: DNR/DNI  TOTAL TIME TAKING CARE OF THIS PATIENT: 30 minutes.   POSSIBLE D/C IN 1-2 DAYS, DEPENDING ON CLINICAL CONDITION.  Neita Carp M.D on 10/28/2018  at 12:10 PM  Between 7am to 6pm - Pager - [phone]  After 6pm go to [email or web address] - password [email or web address] [facility]  Office  [phone]  CC: Primary care physician; Baxter Hire, MD  Note: This dictation was prepared with Dragon dictation along with smaller phrase technology. Any transcriptional errors that result from this process are unintentional.

## 2018-10-28 NOTE — ED Notes (Signed)
ED TO INPATIENT HANDOFF REPORT  ED Nurse Name and Phone #:  Quillian Quince [phone]  S Name/Age/Gender Arthur [name] 85 y.o. male Room/Bed: ED09A/ED09A  Code Status   Code Status: DNR  Home/SNF/Other Home Patient oriented to: self, place, time and situation Is this baseline? Yes   Triage Complete: Triage complete  Chief Complaint back pain  Triage Note Pt to ED via POV c/o lower back pain for the past few days. Pt denies any other symptoms at this time.    Allergies Allergies  Allergen Reactions  . Codeine Other (See Comments)    Constipation    Level of Care/Admitting Diagnosis ED Disposition    ED Disposition Condition [facility]: [place] [[id number]]  Level of Care: Med-Surg [16]  Covid Evaluation: Asymptomatic Screening Protocol (No Symptoms)  Diagnosis: Sepsis [facility]) [[id number]]  Admitting Physician: Christel Mormon [[id number]]  Attending Physician: Christel Mormon [[id number]]  Estimated length of stay: past midnight tomorrow  Certification:: I certify this patient will need inpatient services for at least 2 midnights  PT Class (Do Not Modify): Inpatient [101]  PT Acc Code (Do Not Modify): Private [1]       B Medical/Surgery History Past Medical History:  Diagnosis Date  . Anemia of chronic kidney failure 08/21/2018  . CHF (congestive heart failure) ([place])   . CKD (chronic kidney disease), stage III   . COPD (chronic obstructive pulmonary disease) ([place])   . Diabetes mellitus without complication ([place])   . Hyperlipidemia   . Hypertension    Past Surgical History:  Procedure Laterality Date  . COLONOSCOPY WITH PROPOFOL N/A 06/11/2018   Procedure: COLONOSCOPY WITH PROPOFOL;  Surgeon: Jonathon Bellows, MD;  Location: [facility] ENDOSCOPY;  Service: Gastroenterology;  Laterality: N/A;  . COLONOSCOPY WITH PROPOFOL N/A 06/13/2018   Procedure: COLONOSCOPY WITH PROPOFOL;  Surgeon: Jonathon Bellows, MD;  Location: [facility] ENDOSCOPY;  Service:  Gastroenterology;  Laterality: N/A;  . ESOPHAGOGASTRODUODENOSCOPY Left 06/10/2018   Procedure: ESOPHAGOGASTRODUODENOSCOPY (EGD);  Surgeon: Jonathon Bellows, MD;  Location: [facility] ENDOSCOPY;  Service: Gastroenterology;  Laterality: Left;  . GIVENS CAPSULE STUDY N/A 06/28/2018   Procedure: GIVENS CAPSULE STUDY;  Surgeon: Lucilla Lame, MD;  Location: [facility] ENDOSCOPY;  Service: Endoscopy;  Laterality: N/A;  . GIVENS CAPSULE STUDY N/A 06/30/2018   Procedure: GIVENS CAPSULE STUDY;  Surgeon: Jonathon Bellows, MD;  Location: [facility] ENDOSCOPY;  Service: Gastroenterology;  Laterality: N/A;     A IV Location/Drains/Wounds Patient Lines/Drains/Airways Status   Active Line/Drains/Airways    Name:   Placement date:   Placement time:   Site:   Days:   Peripheral IV 10/27/18 Left Antecubital   10/27/18    1554    Antecubital   1   Airway   06/10/18    0944     140          Intake/Output Last 24 hours  Intake/Output Summary (Last 24 hours) at 10/28/2018 0031 Last data filed at 10/27/2018 2222 Gross per 24 hour  Intake 500 ml  Output -  Net 500 ml    Labs/Imaging Results for orders placed or performed during the hospital encounter of 10/27/18 (from the past 48 hour(s))  CBC with Differential     Status: Abnormal   Collection Time: 10/27/18  3:50 PM  Result Value Ref Range   WBC 18.9 (H) 4.0 - 10.5 K/uL   RBC 3.01 (L) 4.22 - 5.81 MIL/uL   Hemoglobin 9.1 (L) 13.0 - 17.0 g/dL  HCT 29.1 (L) 39.0 - 52.0 %   MCV 96.7 80.0 - 100.0 fL   MCH 30.2 26.0 - 34.0 pg   MCHC 31.3 30.0 - 36.0 g/dL   RDW 19.8 (H) 11.5 - 15.5 %   Platelets 390 150 - 400 K/uL   nRBC 0.4 (H) 0.0 - 0.2 %   Neutrophils Relative % 87 %   Neutro Abs 16.6 (H) 1.7 - 7.7 K/uL   Lymphocytes Relative 5 %   Lymphs Abs 0.9 0.7 - 4.0 K/uL   Monocytes Relative 6 %   Monocytes Absolute 1.1 (H) 0.1 - 1.0 K/uL   Eosinophils Relative 0 %   Eosinophils Absolute 0.0 0.0 - 0.5 K/uL   Basophils Relative 1 %   Basophils Absolute 0.1 0.0 - 0.1 K/uL    Immature Granulocytes 1 %   Abs Immature Granulocytes 0.12 (H) 0.00 - 0.07 K/uL    Comment: Performed at [facility], [address] [place], [place] [address]  Comprehensive metabolic panel     Status: Abnormal   Collection Time: 10/27/18  3:50 PM  Result Value Ref Range   Sodium 135 135 - 145 mmol/L   Potassium 4.3 3.5 - 5.1 mmol/L   Chloride 100 98 - 111 mmol/L   CO2 22 22 - 32 mmol/L   Glucose, Bld 160 (H) 70 - 99 mg/dL   BUN 33 (H) 8 - 23 mg/dL   Creatinine, Ser 2.41 (H) 0.61 - 1.24 mg/dL   Calcium 8.7 (L) 8.9 - 10.3 mg/dL   Total Protein 7.4 6.5 - 8.1 g/dL   Albumin 3.9 3.5 - 5.0 g/dL   AST 15 15 - 41 U/L   ALT 13 0 - 44 U/L   Alkaline Phosphatase 69 38 - 126 U/L   Total Bilirubin 1.0 0.3 - 1.2 mg/dL   GFR calc non Af Amer 24 (L) >60 mL/min   GFR calc Af Amer 28 (L) >60 mL/min   Anion gap 13 5 - 15    Comment: Performed at [facility], [address] [place], [place] [address]  Urinalysis, Complete w Microscopic     Status: Abnormal   Collection Time: 10/27/18  3:50 PM  Result Value Ref Range   Color, Urine YELLOW (A) YELLOW   APPearance CLOUDY (A) CLEAR   Specific Gravity, Urine 1.012 1.005 - 1.030   pH 5.0 5.0 - 8.0   Glucose, UA NEGATIVE NEGATIVE mg/dL   Hgb urine dipstick MODERATE (A) NEGATIVE   Bilirubin Urine NEGATIVE NEGATIVE   Ketones, ur 5 (A) NEGATIVE mg/dL   Protein, ur 100 (A) NEGATIVE mg/dL   Nitrite POSITIVE (A) NEGATIVE   Leukocytes,Ua LARGE (A) NEGATIVE   RBC / HPF 21-50 0 - 5 RBC/hpf   WBC, UA >50 (H) 0 - 5 WBC/hpf   Bacteria, UA NONE SEEN NONE SEEN   Squamous Epithelial / LPF NONE SEEN 0 - 5   WBC Clumps PRESENT     Comment: Performed at [facility], [address] [place], [place] [address]  Lipase, blood     Status: None   Collection Time: 10/27/18  3:50 PM  Result Value Ref Range   Lipase 17 11 - 51 U/L    Comment: Performed at [facility], [address] [place], [place] [address]  Lactic  acid, plasma     Status: None   Collection Time: 10/27/18  3:50 PM  Result Value Ref Range   Lactic Acid, Venous 1.2 0.5 - 1.9  mmol/L    Comment: Performed at [facility], [address] [place], [place] [address]   Dg Chest 2 View  Result Date: 10/27/2018 CLINICAL DATA:  Cough and pain EXAM: CHEST - 2 VIEW COMPARISON:  August 08, 2018 FINDINGS: There is a fairly small pleural effusion with atelectatic change in the right base. Lungs elsewhere are clear. Heart size and pulmonary vascularity are normal. No adenopathy. Bones are osteoporotic. IMPRESSION: Fairly small right pleural effusion with right base atelectasis. No edema or consolidation. Cardiac silhouette stable from prior study. No adenopathy evident. Electronically Signed   By: Lowella Grip III M.D.   On: 10/27/2018 15:46   Ct Renal Stone Study  Result Date: 10/27/2018 CLINICAL DATA:  Lower back pain for several days, stone disease suspected EXAM: CT ABDOMEN AND PELVIS WITHOUT CONTRAST TECHNIQUE: Multidetector CT imaging of the abdomen and pelvis was performed following the standard protocol without IV contrast. COMPARISON:  CT chest 10/07/2018, CT abdomen pelvis August 07, 2018 FINDINGS: Lower chest: Moderate right pleural effusion with adjacent volume loss and opacity favoring atelectasis though infection is not fully excluded. Mild left basilar atelectasis. Upper limits normal size heart. No pericardial effusion. Coronary artery calcification is seen. Calcifications are present on the aortic leaflets. Hepatobiliary: Redemonstration of 1.9 cm peripheral E calcified gallstone in the gallbladder. No pericholecystic inflammation or biliary ductal dilatation. No focal hepatic lesions or hepatic surface contour irregularity. Pancreas: Unremarkable. No pancreatic ductal dilatation or surrounding inflammatory changes. Spleen: Normal in size without focal abnormality. Adrenals/Urinary Tract: Nodular thickening of the adrenal glands is  similar to prior and may reflect senescent change. No visible or contour deforming renal lesions. No urolithiasis. No hydronephrosis. Mild nonspecific bilateral perinephric stranding is similar to comparison exam. There is circumferential bladder wall thickening and perivesicular hazy stranding greater than expected for underdistention. Stomach/Bowel: Distal esophagus, stomach and duodenal sweep are unremarkable. No small bowel wall thickening or dilatation. No evidence of obstruction. A normal appendix is visualized. No colonic dilatation or wall thickening. Moderate stool burden. Vascular/Lymphatic: Extensive atherosclerotic calcification of the aorta and branch vessels. Luminal evaluation limited in the absence of contrast media. Focal region of mid mesenteric hazy stranding with numerous reactive appearing clustered mid mesenteric lymph nodes compatible with mesenteritis (2/47). Reproductive: Enlarged prostate. Question a keyhole defect in the central prostate which may reflect prior TURP. Seminal vesicles are symmetric. Other: No abdominopelvic free fluid or free gas. No bowel containing hernias. Fat containing left inguinal hernia and small fat containing umbilical hernia. Musculoskeletal: Multilevel degenerative changes are present in the imaged portions of the spine. 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 There is a new inferior endplate compression deformity at L2 with approximately 40% height loss centrally. No other acute or suspicious osseous lesion IMPRESSION: 1. New inferior endplate compression deformity at L2 not present on comparison exam from August 07, 2018. Approximately 40% height loss centrally. Correlate with point tenderness. 2. Circumferential bladder wall thickening and perivesicular hazy stranding greater than expected for underdistention. Correlate with urinalysis to exclude cystitis. 3. Focal region of mid mesenteric hazy stranding with numerous reactive  appearing clustered mid mesenteric lymph nodes compatible with mesenteritis, similar to comparison. 4. Moderate right pleural effusion with adjacent volume loss and opacity favoring atelectasis though infection is not fully excluded. 5. Aortic Atherosclerosis (ICD10-I70.0). Electronically Signed   By: Lovena Le M.D.   On: 10/27/2018 18:46    Pending Labs [employer] (From admission, onward)    Start  Ordered   10/28/18 8786  Basic metabolic panel  Tomorrow morning,   STAT     10/27/18 2342   10/28/18 0500  CBC  Tomorrow morning,   STAT     10/27/18 2342   10/27/18 2342  Urine Culture  Once,   STAT     10/27/18 2342   10/27/18 2151  Urine culture  ONCE - STAT,   STAT    Question:  Patient immune status  Answer:  Normal   10/27/18 2150   10/27/18 1921  SARS CORONAVIRUS 2 (TAT 6-24 HRS) Nasopharyngeal Nasopharyngeal Swab  (Asymptomatic/Tier 2)  Once,   STAT    Question Answer Comment  Is this test for diagnosis or screening Screening   Symptomatic for COVID-19 as defined by CDC No   Hospitalized for COVID-19 No   Admitted to ICU for COVID-19 No   Previously tested for COVID-19 Yes   Resident in a congregate (group) care setting No   Employed in healthcare setting No      10/27/18 1920          Vitals/Pain Today's Vitals   10/27/18 1344 10/27/18 1357 10/27/18 2138  BP: (!) 142/40  (!) 131/42  Pulse: 93  68  Resp: 16  18  Temp: 98.6 F (37 C)    TempSrc: Oral    SpO2: 92%  93%  Weight: 102.5 kg 102.5 kg   Height: 5\' 8"  (1.727 m)    PainSc:  3  8     Isolation Precautions No active isolations  Medications Medications  amLODipine (NORVASC) tablet 2.5 mg (has no administration in time range)  pravastatin (PRAVACHOL) tablet 10 mg (has no administration in time range)  torsemide (DEMADEX) tablet 20 mg (has no administration in time range)  insulin aspart (novoLOG) injection 15 Units (has no administration in time range)  insulin detemir (LEVEMIR) injection 12  Units (has no administration in time range)  tamsulosin (FLOMAX) capsule 0.4 mg (has no administration in time range)  pantoprazole (PROTONIX) EC tablet 40 mg (has no administration in time range)  iron polysaccharides (NIFEREX) capsule 150 mg (has no administration in time range)  vitamin C (ASCORBIC ACID) tablet 500 mg (has no administration in time range)  albuterol (PROVENTIL) (2.5 MG/3ML) 0.083% nebulizer solution 2.5 mg (has no administration in time range)  ipratropium-albuterol (DUONEB) 0.5-2.5 (3) MG/3ML nebulizer solution 3 mL (has no administration in time range)  enoxaparin (LOVENOX) injection 30 mg (30 mg Subcutaneous Given 10/28/18 0029)  0.9 %  sodium chloride infusion (has no administration in time range)  acetaminophen (TYLENOL) tablet 650 mg (has no administration in time range)    Or  acetaminophen (TYLENOL) suppository 650 mg (has no administration in time range)  traZODone (DESYREL) tablet 25 mg (has no administration in time range)  magnesium hydroxide (MILK OF MAGNESIA) suspension 30 mL (has no administration in time range)  ondansetron (ZOFRAN) tablet 4 mg (has no administration in time range)    Or  ondansetron (ZOFRAN) injection 4 mg (has no administration in time range)  cefTRIAXone (ROCEPHIN) 1 g in sodium chloride 0.9 % 100 mL IVPB (has no administration in time range)  levofloxacin (LEVAQUIN) IVPB 750 mg (0 mg Intravenous Stopped 10/27/18 1937)  ondansetron (ZOFRAN) injection 4 mg (4 mg Intravenous Given 10/27/18 1801)  sodium chloride 0.9 % bolus 500 mL (0 mLs Intravenous Stopped 10/27/18 2222)    Mobility walks with device Low fall risk   Focused Assessments Musculoskeletal   R Recommendations: See Admitting  Provider Note  Report given to:   Additional Notes:

## 2018-10-29 LAB — BASIC METABOLIC PANEL
Anion gap: 8 (ref 5–15)
BUN: 31 mg/dL — ABNORMAL HIGH (ref 8–23)
CO2: 26 mmol/L (ref 22–32)
Calcium: 8.4 mg/dL — ABNORMAL LOW (ref 8.9–10.3)
Chloride: 102 mmol/L (ref 98–111)
Creatinine, Ser: 2.58 mg/dL — ABNORMAL HIGH (ref 0.61–1.24)
GFR calc Af Amer: 26 mL/min — ABNORMAL LOW (ref >60)
GFR calc non Af Amer: 22 mL/min — ABNORMAL LOW (ref >60)
Glucose, Bld: 109 mg/dL — ABNORMAL HIGH (ref 70–99)
Potassium: 4.2 mmol/L (ref 3.5–5.1)
Sodium: 136 mmol/L (ref 135–145)

## 2018-10-29 LAB — CBC WITH DIFFERENTIAL/PLATELET
Abs Immature Granulocytes: 0.17 10*3/uL — ABNORMAL HIGH (ref 0.00–0.07)
Basophils Absolute: 0.1 10*3/uL (ref 0.0–0.1)
Basophils Relative: 1 %
Eosinophils Absolute: 0.2 10*3/uL (ref 0.0–0.5)
Eosinophils Relative: 2 %
HCT: 27.3 % — ABNORMAL LOW (ref 39.0–52.0)
Hemoglobin: 8.7 g/dL — ABNORMAL LOW (ref 13.0–17.0)
Immature Granulocytes: 2 %
Lymphocytes Relative: 19 %
Lymphs Abs: 2.1 10*3/uL (ref 0.7–4.0)
MCH: 30.5 pg (ref 26.0–34.0)
MCHC: 31.9 g/dL (ref 30.0–36.0)
MCV: 95.8 fL (ref 80.0–100.0)
Monocytes Absolute: 1.1 10*3/uL — ABNORMAL HIGH (ref 0.1–1.0)
Monocytes Relative: 9 %
Neutro Abs: 7.7 10*3/uL (ref 1.7–7.7)
Neutrophils Relative %: 67 %
Platelets: 350 10*3/uL (ref 150–400)
RBC: 2.85 MIL/uL — ABNORMAL LOW (ref 4.22–5.81)
RDW: 19.9 % — ABNORMAL HIGH (ref 11.5–15.5)
WBC: 11.4 10*3/uL — ABNORMAL HIGH (ref 4.0–10.5)
nRBC: 0.6 % — ABNORMAL HIGH (ref 0.0–0.2)

## 2018-10-29 LAB — URINE CULTURE: Culture: >=100000 — AB

## 2018-10-29 LAB — GLUCOSE, CAPILLARY
Glucose-Capillary: 111 mg/dL — ABNORMAL HIGH (ref 70–99)
Glucose-Capillary: 175 mg/dL — ABNORMAL HIGH (ref 70–99)
Glucose-Capillary: 151 mg/dL — ABNORMAL HIGH (ref 70–99)
Glucose-Capillary: 149 mg/dL — ABNORMAL HIGH (ref 70–99)

## 2018-10-29 MED ORDER — CIPROFLOXACIN HCL 500 MG PO TABS
250.0000 mg | ORAL_TABLET | Freq: Two times a day (BID) | ORAL | Status: DC
  Administered 2018-10-30 – 2018-11-02 (×7): 250 mg via ORAL
  Filled 2018-10-29 (×7): qty 1

## 2018-10-29 MED ORDER — PROCHLORPERAZINE EDISYLATE 10 MG/2ML IJ SOLN
5.0000 mg | INTRAMUSCULAR | Status: DC | PRN
  Administered 2018-10-29: 5 mg via INTRAVENOUS
  Filled 2018-10-29 (×2): qty 1

## 2018-10-29 NOTE — Progress Notes (Signed)
PT Cancellation Note  Patient Details Name: Arthur [name] MRN: [id number] DOB: 10-26-1933   Cancelled Treatment:    Reason Eval/Treat Not Completed: Pain limiting ability to participate.  Pt unable to participate during attempt at PT evaluation secondary to severe back pain with even the slightest movement in the bed.  Will continue to follow patient and attempt PT evaluation at a future date following likely kyphoplasty procedure upon receipt of new or continue at transfer PT orders.    Linus Salmons PT, DPT 10/29/18, 9:55 AM

## 2018-10-29 NOTE — TOC Initial Note (Addendum)
Transition of Care [facility]) - Initial/Assessment Note    Patient Details  Name: Arthur [name] MRN: [id number] Date of Birth: [birth year]  [facility] CM/SW Contact:    Beverly Sessions, RN Phone Number: 10/29/2018, 10:47 AM  Clinical Narrative:                  Patient admitted from home with sepsis  Patient states he lives at home alone. Daughter lives locally for support and provides transportation  PCP [place].  Patient denies issues obtaining medications  Patient previously open with [facility].  Per Corene Cornea with [facility] patient was closed 09/23/18 Patient states he has never been to a nursing facility  Patient was previously followed by outpatient palliative.  RNCM reached out to to Santiago Glad with [employer] to check and see if patient is still active  Patient followed by outpatient [facility] care manager   Patient states she has a RW, cane, and WC in the home.  Chronic O2 through [employer].  States he wears it at 2 liters when needed, patient states he has portable tanks  PT eval pending    Expected Discharge Plan: [place] Barriers to Discharge: Continued Medical Work up   Patient Goals and CMS Choice Patient states their goals for this hospitalization and ongoing recovery are:: to get better and get back home      Expected Discharge Plan and Services Expected Discharge Plan: [place]   Discharge Planning Services: CM Consult   Living arrangements for the past 2 months: Single Family Home                                      Prior Living Arrangements/Services Living arrangements for the past 2 months: Single Family Home Lives with:: Self Patient language and need for interpreter reviewed:: Yes Do you feel safe going back to the place where you live?: Yes      Need for Family Participation in Patient Care: Yes (Comment) Care giver support system in  place?: Yes (comment) Current home services: DME Criminal Activity/Legal Involvement Pertinent to Current Situation/Hospitalization: No - Comment as needed  Activities of Daily Living Home Assistive Devices/Equipment: Cane (specify quad or straight), Dentures (specify type), Eyeglasses, Shower chair with back, Wheelchair, Environmental consultant (specify type), Raised toilet seat with rails ADL Screening (condition at time of admission) Patient's cognitive ability adequate to safely complete daily activities?: Yes Is the patient deaf or have difficulty hearing?: No Does the patient have difficulty seeing, even when wearing glasses/contacts?: No Does the patient have difficulty concentrating, remembering, or making decisions?: Yes Patient able to express need for assistance with ADLs?: Yes Does the patient have difficulty dressing or bathing?: Yes Independently performs ADLs?: No Communication: Independent Dressing (OT): Needs assistance Is this a change from baseline?: Pre-admission baseline Grooming: Needs assistance Is this a change from baseline?: Pre-admission baseline Feeding: Independent Bathing: Needs assistance Is this a change from baseline?: Pre-admission baseline Toileting: Independent In/Out Bed: Independent Walks in Home: Independent with device (comment) Does the patient have difficulty walking or climbing stairs?: Yes Weakness of Legs: None Weakness of Arms/Hands: None  Permission Sought/Granted                  Emotional Assessment Appearance:: Appears stated age Attitude/Demeanor/Rapport: Gracious Affect (typically observed): Accepting, Appropriate Orientation: : Oriented to Self,  Oriented to Place, Oriented to  Time, Oriented to Situation Alcohol / Substance Use: Tobacco Use Psych Involvement: No (comment)  Admission diagnosis:  Pyelonephritis [N12] Patient Active Problem List   Diagnosis Date Noted  . Anemia of chronic kidney failure 08/21/2018  . UTI (urinary tract  infection) 08/07/2018  . Acute on chronic renal failure ([place]) 08/07/2018  . HLD (hyperlipidemia) 07/24/2018  . Acute on chronic respiratory failure with hypoxia ([place]) 07/06/2018  . Severe anemia 06/27/2018  . GI bleeding 06/09/2018  . Sepsis ([place]) 05/23/2018  . Cellulitis of right lower extremity 05/22/2018  . Diabetes ([place]) 10/24/2006  . OBESITY 10/24/2006  . Essential hypertension 10/24/2006  . ALLERGIC RHINITIS 10/24/2006  . COPD (chronic obstructive pulmonary disease) ([place]) 10/24/2006  . TOBACCO ABUSE, HX OF 10/24/2006   PCP:  Baxter Hire, MD Pharmacy:   [facility] [address] [place] - [place] [address] [place] [address] Phone: [phone] Fax: [phone]     Social Determinants of Health (SDOH) Interventions    Readmission Risk Interventions Readmission Risk Prevention Plan 10/29/2018 08/09/2018 07/26/2018  Transportation Screening Complete Complete Complete  PCP or Specialist Appt within 5-7 Days - - -  PCP or Specialist Appt within 3-5 Days - - Complete  Home Care Screening - - -  Medication Review (RN CM) - - -  [place] or [place] - - Complete  Social Work Consult for [place] Planning/Counseling - - Complete  Palliative Care Screening - - Not Applicable  Medication Review Press photographer) Complete Complete Complete  PCP or Specialist appointment within 3-5 days of discharge - Complete -  [place] or [place] - Complete -  SW Recovery Care/Counseling Consult - Complete -  [place] - Not Applicable -  Some recent data might be hidden

## 2018-10-29 NOTE — Progress Notes (Signed)
O2 sat 89% on RA before and after C/DB. O2 @ 2L [place] applied. Sat increased to 96%. Barbaraann Faster, RN; 6:59 AM; 10/29/2018

## 2018-10-29 NOTE — Progress Notes (Addendum)
Patient is currently followed by [employer] community Palliative program at home. CMRN Isaias Cowman aware. Flo Shanks BSN, RN, [facility] [employer] [phone]

## 2018-10-29 NOTE — Progress Notes (Signed)
[place] [facility] [place] NAME: Arthur [name]    MR#:  [id number]  DATE OF BIRTH:  17-Aug-1933  SUBJECTIVE:  CHIEF COMPLAINT:   Chief Complaint  Patient presents with  . Back Pain   Continues to have severe low back pain with minimal movement. Afebrile.  REVIEW OF SYSTEMS:    Review of Systems  Constitutional: Positive for malaise/fatigue. Negative for fever.  HENT: Negative for sore throat.   Eyes: Negative for blurred vision, double vision and pain.  Respiratory: Negative for cough, hemoptysis, shortness of breath and wheezing.   Cardiovascular: Negative for chest pain, palpitations, orthopnea and leg swelling.  Gastrointestinal: Negative for abdominal pain, constipation, diarrhea, heartburn, nausea and vomiting.  Genitourinary: Negative for dysuria and hematuria.  Musculoskeletal: Positive for back pain. Negative for joint pain.  Skin: Negative for rash.  Neurological: Negative for sensory change, speech change, focal weakness and headaches.  Endo/Heme/Allergies: Does not bruise/bleed easily.  Psychiatric/Behavioral: Negative for depression. The patient is not nervous/anxious.     DRUG ALLERGIES:   Allergies  Allergen Reactions  . Codeine Other (See Comments)    Constipation    VITALS:  Blood pressure (!) 146/61, pulse 73, temperature 97.9 F (36.6 C), temperature source Oral, resp. rate 18, height 5\' 8"  (1.727 m), weight 78.4 kg, SpO2 98 %.  PHYSICAL EXAMINATION:   Physical Exam  GENERAL:  85 y.o.-year-old patient lying in the bed with no acute distress.  EYES: Pupils equal, round, reactive to light and accommodation. No scleral icterus. Extraocular muscles intact.  HEENT: Head atraumatic, normocephalic. Oropharynx and nasopharynx clear.  NECK:  Supple, no jugular venous distention. No thyroid enlargement, no tenderness.  LUNGS: Normal breath sounds bilaterally, no wheezing, rales, rhonchi. No use of accessory muscles of  respiration.  CARDIOVASCULAR: S1, S2 normal. No murmurs, rubs, or gallops.  ABDOMEN: Soft, nontender, nondistended. Bowel sounds present. No organomegaly or mass.  EXTREMITIES: No cyanosis, clubbing or edema b/l.    NEUROLOGIC: Cranial nerves II through XII are intact. No focal Motor or sensory deficits b/l.   PSYCHIATRIC: The patient is alert and oriented x 3.  SKIN: No obvious rash, lesion, or ulcer.   LABORATORY PANEL:   CBC Recent Labs  Lab 10/29/18 0547  WBC 11.4*  HGB 8.7*  HCT 27.3*  PLT 350   ------------------------------------------------------------------------------------------------------------------ Chemistries  Recent Labs  Lab 10/27/18 1550  10/29/18 0547  NA 135   < > 136  K 4.3   < > 4.2  CL 100   < > 102  CO2 22   < > 26  GLUCOSE 160*   < > 109*  BUN 33*   < > 31*  CREATININE 2.41*   < > 2.58*  CALCIUM 8.7*   < > 8.4*  AST 15  --   --   ALT 13  --   --   ALKPHOS 69  --   --   BILITOT 1.0  --   --    < > = values in this interval not displayed.   ------------------------------------------------------------------------------------------------------------------  Cardiac Enzymes No results for input(s): TROPONINI in the last 168 hours. ------------------------------------------------------------------------------------------------------------------  RADIOLOGY:  Dg Orbits  Result Date: 10/28/2018 CLINICAL DATA:  Metal working/exposure; clearance prior to MRI EXAM: ORBITS - COMPLETE 4+ VIEW COMPARISON:  None. FINDINGS: There is no evidence of metallic foreign body within the orbits. No significant bone abnormality identified. IMPRESSION: No evidence of metallic foreign body within the orbits. Electronically Signed  By: Marijo Conception M.D.   On: 10/28/2018 15:27   Dg Chest 2 View  Result Date: 10/27/2018 CLINICAL DATA:  Cough and pain EXAM: CHEST - 2 VIEW COMPARISON:  August 08, 2018 FINDINGS: There is a fairly small pleural effusion with atelectatic  change in the right base. Lungs elsewhere are clear. Heart size and pulmonary vascularity are normal. No adenopathy. Bones are osteoporotic. IMPRESSION: Fairly small right pleural effusion with right base atelectasis. No edema or consolidation. Cardiac silhouette stable from prior study. No adenopathy evident. Electronically Signed   By: Lowella Grip III M.D.   On: 10/27/2018 15:46   Mr Lumbar Spine Wo Contrast  Result Date: 10/28/2018 CLINICAL DATA:  Acute bilateral low back pain for 2-3 days, nausea, and fatigue. L2 compression fracture on CT. EXAM: MRI LUMBAR SPINE WITHOUT CONTRAST TECHNIQUE: Multiplanar, multisequence MR imaging of the lumbar spine was performed. No intravenous contrast was administered. COMPARISON:  CT abdomen and pelvis 10/27/2018 FINDINGS: Segmentation: There is transitional lumbosacral anatomy. For consistency with the recent CT report, the transitional segment will be considered a partially sacralized L5. Alignment: Slight right convex curvature of the lumbar spine. No significant listhesis. Vertebrae: L2 inferior endplate compression fracture with 30% vertebral body height loss centrally and mild marrow edema along the inferior endplate without retropulsion. Degenerative endplate marrow changes at multiple levels, most notably L1-2 and L4-5. Nonspecific diffuse bone marrow heterogeneity without suspicious focal lesion identified. Conus medullaris and cauda equina: Conus extends to the upper T12 level. Conus and cauda equina appear normal. Paraspinal and other soft tissues: Mild left-sided paravertebral soft tissue edema at L2-3 adjacent to the compression fracture. Disc levels: Disc desiccation from L1-2 to L4-5. Mild disc space narrowing at L1-2 and moderate to severe narrowing at L4-5. T12-L1: Minimal facet arthrosis without disc herniation or stenosis. L1-2: Mild circumferential disc bulging and mild facet and ligamentum flavum hypertrophy without significant stenosis. L2-3:  Mild disc bulging and moderate facet and ligamentum flavum hypertrophy without significant stenosis. L3-4: Mild disc bulging, moderate facet and ligamentum flavum hypertrophy, and congenitally short pedicles result in moderate spinal stenosis and minimal left neural foraminal stenosis. L4-5: Prominent right-sided ventral epidural fat extending into the right neural foramen with remodeling of the posterior L4 vertebral body and pedicle. Disc bulging, congenitally short pedicles, and moderate to severe facet and ligamentum flavum hypertrophy result in mild-to-moderate spinal stenosis and mild left neural foraminal stenosis. L5-S1: Transitional anatomy. No stenosis. IMPRESSION: 1. Transitional lumbosacral anatomy as above. 2. Acute to subacute L2 compression fracture with 30% height loss. 3. Moderate multifactorial spinal stenosis at L3-4. 4. Mild-to-moderate spinal stenosis at L4-5. Electronically Signed   By: Logan Bores M.D.   On: 10/28/2018 16:54   Ct Renal Stone Study  Result Date: 10/27/2018 CLINICAL DATA:  Lower back pain for several days, stone disease suspected EXAM: CT ABDOMEN AND PELVIS WITHOUT CONTRAST TECHNIQUE: Multidetector CT imaging of the abdomen and pelvis was performed following the standard protocol without IV contrast. COMPARISON:  CT chest 10/07/2018, CT abdomen pelvis August 07, 2018 FINDINGS: Lower chest: Moderate right pleural effusion with adjacent volume loss and opacity favoring atelectasis though infection is not fully excluded. Mild left basilar atelectasis. Upper limits normal size heart. No pericardial effusion. Coronary artery calcification is seen. Calcifications are present on the aortic leaflets. Hepatobiliary: Redemonstration of 1.9 cm peripheral E calcified gallstone in the gallbladder. No pericholecystic inflammation or biliary ductal dilatation. No focal hepatic lesions or hepatic surface contour irregularity. Pancreas: Unremarkable. No pancreatic  ductal dilatation or  surrounding inflammatory changes. Spleen: Normal in size without focal abnormality. Adrenals/Urinary Tract: Nodular thickening of the adrenal glands is similar to prior and may reflect senescent change. No visible or contour deforming renal lesions. No urolithiasis. No hydronephrosis. Mild nonspecific bilateral perinephric stranding is similar to comparison exam. There is circumferential bladder wall thickening and perivesicular hazy stranding greater than expected for underdistention. Stomach/Bowel: Distal esophagus, stomach and duodenal sweep are unremarkable. No small bowel wall thickening or dilatation. No evidence of obstruction. A normal appendix is visualized. No colonic dilatation or wall thickening. Moderate stool burden. Vascular/Lymphatic: Extensive atherosclerotic calcification of the aorta and branch vessels. Luminal evaluation limited in the absence of contrast media. Focal region of mid mesenteric hazy stranding with numerous reactive appearing clustered mid mesenteric lymph nodes compatible with mesenteritis (2/47). Reproductive: Enlarged prostate. Question a keyhole defect in the central prostate which may reflect prior TURP. Seminal vesicles are symmetric. Other: No abdominopelvic free fluid or free gas. No bowel containing hernias. Fat containing left inguinal hernia and small fat containing umbilical hernia. Musculoskeletal: Multilevel degenerative changes are present in the imaged portions of the spine. 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 There is a new inferior endplate compression deformity at L2 with approximately 40% height loss centrally. No other acute or suspicious osseous lesion IMPRESSION: 1. New inferior endplate compression deformity at L2 not present on comparison exam from August 07, 2018. Approximately 40% height loss centrally. Correlate with point tenderness. 2. Circumferential bladder wall thickening and perivesicular hazy stranding greater than  expected for underdistention. Correlate with urinalysis to exclude cystitis. 3. Focal region of mid mesenteric hazy stranding with numerous reactive appearing clustered mid mesenteric lymph nodes compatible with mesenteritis, similar to comparison. 4. Moderate right pleural effusion with adjacent volume loss and opacity favoring atelectasis though infection is not fully excluded. 5. Aortic Atherosclerosis (ICD10-I70.0). Electronically Signed   By: Lovena Le M.D.   On: 10/27/2018 18:46     ASSESSMENT AND PLAN:   * UTI with  sepsis.  Urine cultures pending.  Started on IV ceftriaxone--change to po cipro Sepsis has resolved. WBC improving  * L2 compression fracture with intractable pain Consulted Dr. Rudene Christians-- hoping to get Kyphoplasty tomorrow  * Hypertension  continue amlodipine.  *  Type 2 diabetes Basal insulin coverage and sliding scale insulin  *  BPH.  Flomax will be resumed.  *  COPD.    Nebulizers as needed  *  DVT prophylaxis.  Subcutaneous Lovenox.  All the records are reviewed and case discussed with Care Management/Social Worker Management plans discussed with the patient and they are in agreement.  CODE STATUS: DNR/DNI  TOTAL TIME TAKING CARE OF THIS PATIENT: 30 minutes.   POSSIBLE D/C IN 1-2 DAYS, DEPENDING ON CLINICAL CONDITION.  Fritzi Mandes M.D on 10/29/2018 at 3:15 PM  Between 7am to 6pm - Pager - [phone]  After 6pm go to [email or web address] - password [email or web address] [facility]  Office  [phone]  CC: Primary care physician; Baxter Hire, MD  Note: This dictation was prepared with Dragon dictation along with smaller phrase technology. Any transcriptional errors that result from this process are unintentional.

## 2018-10-30 ENCOUNTER — Encounter: Payer: Self-pay | Admitting: Anesthesiology

## 2018-10-30 LAB — GLUCOSE, CAPILLARY
Glucose-Capillary: 104 mg/dL — ABNORMAL HIGH (ref 70–99)
Glucose-Capillary: 169 mg/dL — ABNORMAL HIGH (ref 70–99)
Glucose-Capillary: 143 mg/dL — ABNORMAL HIGH (ref 70–99)
Glucose-Capillary: 128 mg/dL — ABNORMAL HIGH (ref 70–99)

## 2018-10-30 MED ORDER — CEFAZOLIN SODIUM-DEXTROSE 1-4 GM/50ML-% IV SOLN
1.0000 g | Freq: Once | INTRAVENOUS | Status: DC
  Filled 2018-10-30: qty 50

## 2018-10-30 NOTE — Progress Notes (Signed)
PT Cancellation Note  Patient Details Name: Arthur [name] MRN: [id number] DOB: November 29, 1933   Cancelled Treatment:    Reason Eval/Treat Not Completed: Patient not medically ready; Per MD, ok to hold PT eval until after kyphoplasty tentatively scheduled for 10/31/18.     D. Scott Yusif Gnau PT, DPT 10/30/18, 9:12 AM

## 2018-10-30 NOTE — Care Management Important Message (Signed)
Important Message  Patient Details  Name: Arthur [name] MRN: [id number] Date of Birth: 1933/07/16   Medicare Important Message Given:  Yes     Juliann Pulse A Arthur [name] 10/30/2018, 10:49 AM

## 2018-10-30 NOTE — Progress Notes (Addendum)
[facility] [place] NAME: Arthur [name]    MR#:  [id number]  DATE OF BIRTH:  1933-11-24  SUBJECTIVE:  CHIEF COMPLAINT:   Chief Complaint  Patient presents with  . Back Pain   Continues to have severe low back pain with minimal movement. Afebrile. Appears to be in good spirits REVIEW OF SYSTEMS:    Review of Systems  Constitutional: Positive for malaise/fatigue. Negative for fever.  HENT: Negative for sore throat.   Eyes: Negative for blurred vision, double vision and pain.  Respiratory: Negative for cough, hemoptysis, shortness of breath and wheezing.   Cardiovascular: Negative for chest pain, palpitations, orthopnea and leg swelling.  Gastrointestinal: Negative for abdominal pain, constipation, diarrhea, heartburn, nausea and vomiting.  Genitourinary: Negative for dysuria and hematuria.  Musculoskeletal: Positive for back pain. Negative for joint pain.  Skin: Negative for rash.  Neurological: Negative for sensory change, speech change, focal weakness and headaches.  Endo/Heme/Allergies: Does not bruise/bleed easily.  Psychiatric/Behavioral: Negative for depression. The patient is not nervous/anxious.     DRUG ALLERGIES:   Allergies  Allergen Reactions  . Codeine Other (See Comments)    Constipation    VITALS:  Blood pressure (!) 130/56, pulse 65, temperature 98.3 F (36.8 C), temperature source Oral, resp. rate 20, height 5\' 8"  (1.727 m), weight 78.4 kg, SpO2 94 %.  PHYSICAL EXAMINATION:   Physical Exam  GENERAL:  85 y.o.-year-old patient lying in the bed with no acute distress.  EYES: Pupils equal, round, reactive to light and accommodation. No scleral icterus. Extraocular muscles intact.  HEENT: Head atraumatic, normocephalic. Oropharynx and nasopharynx clear.  NECK:  Supple, no jugular venous distention. No thyroid enlargement, no tenderness.  LUNGS: Normal breath sounds bilaterally, no wheezing, rales, rhonchi. No use of  accessory muscles of respiration.  CARDIOVASCULAR: S1, S2 normal. No murmurs, rubs, or gallops.  ABDOMEN: Soft, nontender, nondistended. Bowel sounds present. No organomegaly or mass.  EXTREMITIES: No cyanosis, clubbing or edema b/l.    NEUROLOGIC: Cranial nerves II through XII are intact. No focal Motor or sensory deficits b/l.   PSYCHIATRIC: The patient is alert and oriented x 3.  SKIN: No obvious rash, lesion, or ulcer.   LABORATORY PANEL:   CBC Recent Labs  Lab 10/29/18 0547  WBC 11.4*  HGB 8.7*  HCT 27.3*  PLT 350   ------------------------------------------------------------------------------------------------------------------ Chemistries  Recent Labs  Lab 10/27/18 1550  10/29/18 0547  NA 135   < > 136  K 4.3   < > 4.2  CL 100   < > 102  CO2 22   < > 26  GLUCOSE 160*   < > 109*  BUN 33*   < > 31*  CREATININE 2.41*   < > 2.58*  CALCIUM 8.7*   < > 8.4*  AST 15  --   --   ALT 13  --   --   ALKPHOS 69  --   --   BILITOT 1.0  --   --    < > = values in this interval not displayed.   ------------------------------------------------------------------------------------------------------------------  Cardiac Enzymes No results for input(s): TROPONINI in the last 168 hours. ------------------------------------------------------------------------------------------------------------------  RADIOLOGY:  Dg Orbits  Result Date: 10/28/2018 CLINICAL DATA:  Metal working/exposure; clearance prior to MRI EXAM: ORBITS - COMPLETE 4+ VIEW COMPARISON:  None. FINDINGS: There is no evidence of metallic foreign body within the orbits. No significant bone abnormality identified. IMPRESSION: No evidence of metallic foreign body within the  orbits. Electronically Signed   By: Marijo Conception M.D.   On: 10/28/2018 15:27   Mr Lumbar Spine Wo Contrast  Result Date: 10/28/2018 CLINICAL DATA:  Acute bilateral low back pain for 2-3 days, nausea, and fatigue. L2 compression fracture on CT.  EXAM: MRI LUMBAR SPINE WITHOUT CONTRAST TECHNIQUE: Multiplanar, multisequence MR imaging of the lumbar spine was performed. No intravenous contrast was administered. COMPARISON:  CT abdomen and pelvis 10/27/2018 FINDINGS: Segmentation: There is transitional lumbosacral anatomy. For consistency with the recent CT report, the transitional segment will be considered a partially sacralized L5. Alignment: Slight right convex curvature of the lumbar spine. No significant listhesis. Vertebrae: L2 inferior endplate compression fracture with 30% vertebral body height loss centrally and mild marrow edema along the inferior endplate without retropulsion. Degenerative endplate marrow changes at multiple levels, most notably L1-2 and L4-5. Nonspecific diffuse bone marrow heterogeneity without suspicious focal lesion identified. Conus medullaris and cauda equina: Conus extends to the upper T12 level. Conus and cauda equina appear normal. Paraspinal and other soft tissues: Mild left-sided paravertebral soft tissue edema at L2-3 adjacent to the compression fracture. Disc levels: Disc desiccation from L1-2 to L4-5. Mild disc space narrowing at L1-2 and moderate to severe narrowing at L4-5. T12-L1: Minimal facet arthrosis without disc herniation or stenosis. L1-2: Mild circumferential disc bulging and mild facet and ligamentum flavum hypertrophy without significant stenosis. L2-3: Mild disc bulging and moderate facet and ligamentum flavum hypertrophy without significant stenosis. L3-4: Mild disc bulging, moderate facet and ligamentum flavum hypertrophy, and congenitally short pedicles result in moderate spinal stenosis and minimal left neural foraminal stenosis. L4-5: Prominent right-sided ventral epidural fat extending into the right neural foramen with remodeling of the posterior L4 vertebral body and pedicle. Disc bulging, congenitally short pedicles, and moderate to severe facet and ligamentum flavum hypertrophy result in  mild-to-moderate spinal stenosis and mild left neural foraminal stenosis. L5-S1: Transitional anatomy. No stenosis. IMPRESSION: 1. Transitional lumbosacral anatomy as above. 2. Acute to subacute L2 compression fracture with 30% height loss. 3. Moderate multifactorial spinal stenosis at L3-4. 4. Mild-to-moderate spinal stenosis at L4-5. Electronically Signed   By: Logan Bores M.D.   On: 10/28/2018 16:54     ASSESSMENT AND PLAN:   * UTI with  sepsis on admission now resolved Urine cultures enterobacter Cloacae - was on IV ceftriaxone--change to po cipro Sepsis has resolved. WBC improving  * L2 compression fracture with intractable pain Consulted Dr. Rudene Christians-- hoping to get Kyphoplasty tomorrow (Thursday)  * Hypertension  continue amlodipine.  *  Type 2 diabetes Basal insulin coverage and sliding scale insulin  *  BPH.  Flomax will be resumed.  *  COPD.    Nebulizers as needed  *  DVT prophylaxis.  Subcutaneous Lovenox.  Spoke with dter [name] on the phone today and updated  All the records are reviewed and case discussed with Care Management/Social Worker Management plans discussed with the patient and they are in agreement.  CODE STATUS: DNR/DNI  TOTAL TIME TAKING CARE OF THIS PATIENT: 30 minutes.   POSSIBLE D/C IN 1-2 DAYS, DEPENDING ON CLINICAL CONDITION.  Fritzi Mandes M.D on 10/30/2018 at 8:57 AM  Between 7am to 6pm - Pager - [phone]  After 6pm go to [email or web address] - password [email or web address] [facility]  Office  [phone]  CC: Primary care physician; Baxter Hire, MD  Note: This dictation was prepared with Dragon dictation along with smaller phrase technology. Any transcriptional errors that result from this process  are unintentional.

## 2018-10-30 NOTE — Anesthesia Preprocedure Evaluation (Addendum)
Anesthesia Evaluation  Patient identified by MRN, date of birth, ID band Patient awake    Reviewed: Allergy & Precautions, H&P , NPO status , Patient's Chart, lab work & pertinent test results, reviewed documented beta blocker date and time   History of Anesthesia Complications Negative for: history of anesthetic complications  Airway Mallampati: III  TM Distance: >3 FB Neck ROM: full    Dental  (+) Edentulous Upper, Edentulous Lower   Pulmonary neg sleep apnea, COPD (uses O2 PRN),  oxygen dependent, former smoker,           Cardiovascular hypertension, Pt. on medications +CHF  (-) Past MI (-) dysrhythmias (-) Valvular Problems/Murmurs     Neuro/Psych neg Seizures    GI/Hepatic Neg liver ROS, neg GERD  ,  Endo/Other  diabetes, Type 2, Oral Hypoglycemic Agents  Renal/GU Renal InsufficiencyRenal disease     Musculoskeletal   Abdominal   Peds  Hematology  (+) anemia , GI Bleed   Anesthesia Other Findings      Reproductive/Obstetrics                            Anesthesia Physical  Anesthesia Plan  ASA: III  Anesthesia Plan: General   Post-op Pain Management:    Induction: Intravenous  PONV Risk Score and Plan: 2 and TIVA and Propofol infusion  Airway Management Planned: Nasal Cannula  Additional Equipment:   Intra-op Plan:   Post-operative Plan:   Informed Consent: I have reviewed the patients History and Physical, chart, labs and discussed the procedure including the risks, benefits and alternatives for the proposed anesthesia with the patient or authorized representative who has indicated his/her understanding and acceptance.       Plan Discussed with:   Anesthesia Plan Comments:        Anesthesia Quick Evaluation

## 2018-10-31 ENCOUNTER — Inpatient Hospital Stay: Payer: Medicare HMO | Admitting: Anesthesiology

## 2018-10-31 ENCOUNTER — Encounter
Admission: EM | Disposition: A | Discharge status: Skilled Nursing Facility | Payer: Self-pay | Source: Home / Self Care | Attending: Internal Medicine

## 2018-10-31 ENCOUNTER — Inpatient Hospital Stay: Payer: Medicare HMO

## 2018-10-31 HISTORY — PX: KYPHOPLASTY: SHX5884

## 2018-10-31 LAB — CBC
HCT: 30.9 % — ABNORMAL LOW (ref 39.0–52.0)
Hemoglobin: 9.8 g/dL — ABNORMAL LOW (ref 13.0–17.0)
MCH: 30.6 pg (ref 26.0–34.0)
MCHC: 31.7 g/dL (ref 30.0–36.0)
MCV: 96.6 fL (ref 80.0–100.0)
Platelets: 315 10*3/uL (ref 150–400)
RBC: 3.2 MIL/uL — ABNORMAL LOW (ref 4.22–5.81)
RDW: 19.4 % — ABNORMAL HIGH (ref 11.5–15.5)
WBC: 8.6 10*3/uL (ref 4.0–10.5)
nRBC: 0.8 % — ABNORMAL HIGH (ref 0.0–0.2)

## 2018-10-31 LAB — CREATININE, SERUM
Creatinine, Ser: 2.26 mg/dL — ABNORMAL HIGH (ref 0.61–1.24)
GFR calc Af Amer: 30 mL/min — ABNORMAL LOW (ref >60)
GFR calc non Af Amer: 26 mL/min — ABNORMAL LOW (ref >60)

## 2018-10-31 LAB — GLUCOSE, CAPILLARY
Glucose-Capillary: 115 mg/dL — ABNORMAL HIGH (ref 70–99)
Glucose-Capillary: 141 mg/dL — ABNORMAL HIGH (ref 70–99)
Glucose-Capillary: 194 mg/dL — ABNORMAL HIGH (ref 70–99)
Glucose-Capillary: 239 mg/dL — ABNORMAL HIGH (ref 70–99)

## 2018-10-31 SURGERY — KYPHOPLASTY
Anesthesia: General

## 2018-10-31 MED ORDER — ESMOLOL HCL 100 MG/10ML IV SOLN
INTRAVENOUS | Status: DC | PRN
  Administered 2018-10-31: 10 mg via INTRAVENOUS

## 2018-10-31 MED ORDER — PROPOFOL 500 MG/50ML IV EMUL
INTRAVENOUS | Status: DC | PRN
  Administered 2018-10-31: 50 ug/kg/min via INTRAVENOUS

## 2018-10-31 MED ORDER — BUPIVACAINE-EPINEPHRINE (PF) 0.5% -1:200000 IJ SOLN
INTRAMUSCULAR | Status: DC | PRN
  Administered 2018-10-31: 20 mL

## 2018-10-31 MED ORDER — MAGNESIUM CITRATE PO SOLN: 1.0000 | Freq: Once | ORAL | Status: DC | PRN

## 2018-10-31 MED ORDER — METOCLOPRAMIDE HCL 5 MG PO TABS: 5.0000 mg | ORAL_TABLET | Freq: Three times a day (TID) | ORAL | Status: DC | PRN

## 2018-10-31 MED ORDER — IOHEXOL 180 MG/ML  SOLN
INTRAMUSCULAR | Status: DC | PRN
  Administered 2018-10-31: 20 mL

## 2018-10-31 MED ORDER — AMLODIPINE BESYLATE 5 MG PO TABS
5.0000 mg | ORAL_TABLET | Freq: Every day | ORAL | Status: DC
  Administered 2018-11-01 – 2018-11-05 (×4): 5 mg via ORAL
  Filled 2018-10-31 (×5): qty 1

## 2018-10-31 MED ORDER — METOPROLOL TARTRATE 5 MG/5ML IV SOLN: 5.0000 mg | INTRAVENOUS | Status: DC | PRN

## 2018-10-31 MED ORDER — FENTANYL CITRATE (PF) 100 MCG/2ML IJ SOLN
INTRAMUSCULAR | Status: DC | PRN
  Administered 2018-10-31 (×4): 25 ug via INTRAVENOUS

## 2018-10-31 MED ORDER — ENOXAPARIN SODIUM 30 MG/0.3ML ~~LOC~~ SOLN
30.0000 mg | SUBCUTANEOUS | Status: DC
  Administered 2018-11-01 – 2018-11-05 (×5): 30 mg via SUBCUTANEOUS
  Filled 2018-10-31 (×5): qty 0.3

## 2018-10-31 MED ORDER — GLYCOPYRROLATE 0.2 MG/ML IJ SOLN
INTRAMUSCULAR | Status: DC | PRN
  Administered 2018-10-31: 0.2 mg via INTRAVENOUS

## 2018-10-31 MED ORDER — SODIUM CHLORIDE 0.9 % IV SOLN
INTRAVENOUS | Status: DC | PRN
  Administered 2018-10-31: 10:00:00 via INTRAVENOUS

## 2018-10-31 MED ORDER — FENTANYL CITRATE (PF) 100 MCG/2ML IJ SOLN: 25.0000 ug | INTRAMUSCULAR | Status: DC | PRN

## 2018-10-31 MED ORDER — PROPOFOL 10 MG/ML IV BOLUS
INTRAVENOUS | Status: AC
  Filled 2018-10-31: qty 20

## 2018-10-31 MED ORDER — ONDANSETRON HCL 4 MG/2ML IJ SOLN
INTRAMUSCULAR | Status: DC | PRN
  Administered 2018-10-31: 4 mg via INTRAVENOUS

## 2018-10-31 MED ORDER — KETAMINE HCL 50 MG/ML IJ SOLN
INTRAMUSCULAR | Status: DC | PRN
  Administered 2018-10-31: 50 mg via INTRAMUSCULAR

## 2018-10-31 MED ORDER — METOCLOPRAMIDE HCL 5 MG/ML IJ SOLN: 5.0000 mg | Freq: Three times a day (TID) | INTRAMUSCULAR | Status: DC | PRN

## 2018-10-31 MED ORDER — ONDANSETRON HCL 4 MG/2ML IJ SOLN: 4.0000 mg | Freq: Once | INTRAMUSCULAR | Status: DC | PRN

## 2018-10-31 MED ORDER — BISACODYL 10 MG RE SUPP: 10.0000 mg | Freq: Every day | RECTAL | Status: DC | PRN

## 2018-10-31 MED ORDER — FENTANYL CITRATE (PF) 100 MCG/2ML IJ SOLN
INTRAMUSCULAR | Status: AC
  Filled 2018-10-31: qty 2

## 2018-10-31 MED ORDER — LIDOCAINE HCL 1 % IJ SOLN
INTRAMUSCULAR | Status: DC | PRN
  Administered 2018-10-31: 5 mL
  Administered 2018-10-31: 25 mL

## 2018-10-31 MED ORDER — DOCUSATE SODIUM 100 MG PO CAPS
100.0000 mg | ORAL_CAPSULE | Freq: Two times a day (BID) | ORAL | Status: DC
  Administered 2018-11-01 – 2018-11-05 (×9): 100 mg via ORAL
  Filled 2018-10-31 (×10): qty 1

## 2018-10-31 SURGICAL SUPPLY — 21 items
ADH SKN CLS APL DERMABOND .7 (GAUZE/BANDAGES/DRESSINGS) ×1
CEMENT KYPHON CX01A KIT/MIXER (Cement) ×3 IMPLANT
COVER WAND RF STERILE (DRAPES) ×3 IMPLANT
DERMABOND ADVANCED (GAUZE/BANDAGES/DRESSINGS) ×2
DERMABOND ADVANCED .7 DNX12 (GAUZE/BANDAGES/DRESSINGS) ×1 IMPLANT
DEVICE BIOPSY BONE KYPHX (INSTRUMENTS) ×3 IMPLANT
DRAPE C-ARM XRAY 36X54 (DRAPES) ×3 IMPLANT
DURAPREP 26ML APPLICATOR (WOUND CARE) ×3 IMPLANT
FEE RENTAL RFA GENERATOR (MISCELLANEOUS) IMPLANT
GLOVE SURG SYN 9.0  PF PI (GLOVE) ×2
GLOVE SURG SYN 9.0 PF PI (GLOVE) ×1 IMPLANT
GOWN SRG 2XL LVL 4 RGLN SLV (GOWNS) ×1 IMPLANT
GOWN STRL NON-REIN 2XL LVL4 (GOWNS) ×3
GOWN STRL REUS W/ TWL LRG LVL3 (GOWN DISPOSABLE) ×1 IMPLANT
GOWN STRL REUS W/TWL LRG LVL3 (GOWN DISPOSABLE) ×3
PACK KYPHOPLASTY (MISCELLANEOUS) ×3 IMPLANT
RENTAL RFA  GENERATOR (MISCELLANEOUS)
RENTAL RFA GENERATOR (MISCELLANEOUS) IMPLANT
STRAP SAFETY 5IN WIDE (MISCELLANEOUS) ×3 IMPLANT
TRAY KYPHOPAK 15/3 EXPRESS 1ST (MISCELLANEOUS) ×3 IMPLANT
TRAY KYPHOPAK 20/3 EXPRESS 1ST (MISCELLANEOUS) ×1 IMPLANT

## 2018-10-31 NOTE — Transfer of Care (Signed)
Immediate Anesthesia Transfer of Care Note  Patient: Arthur [name]  Procedure(s) Performed: KYPHOPLASTY L2 (N/A )  Patient Location: PACU  Anesthesia Type:General  Level of Consciousness: awake, alert  and oriented  Airway & Oxygen Therapy: Patient connected to nasal cannula oxygen  Post-op Assessment: Post -op Vital signs reviewed and stable  Post vital signs: stable  Last Vitals:  Vitals Value Taken Time  BP 122/52 10/31/18 1104  Temp    Pulse 97 10/31/18 1104  Resp 19 10/31/18 1104  SpO2 100 % 10/31/18 1104    Last Pain:  Vitals:   10/31/18 0838  TempSrc: Temporal  PainSc: 0-No pain      Patients Stated Pain Goal: 0 (48/18/59 0931)  Complications: No apparent anesthesia complications

## 2018-10-31 NOTE — Op Note (Signed)
Date 10/31/2018  Time  11:00 am   PATIENT: Arthur [name]   PRE-OPERATIVE DIAGNOSIS:  closed wedge compression fracture of L2   POST-OPERATIVE DIAGNOSIS:  closed wedge compression fracture of L2   PROCEDURE:  Procedure(s): KYPHOPLASTY L2  SURGEON: Laurene Footman, MD   ASSISTANTS: None   ANESTHESIA:   local and MAC   EBL:  No intake/output data recorded.   BLOOD ADMINISTERED:none   DRAINS: none    LOCAL MEDICATIONS USED:  MARCAINE    and XYLOCAINE    SPECIMEN:   L2 vertebral body   DISPOSITION OF SPECIMEN:  Pathology   COUNTS:  YES   TOURNIQUET:  * No tourniquets in log *   IMPLANTS: Bone cement   DICTATION: .Dragon Dictation  patient was brought to the operating room and after adequate anesthesia was obtained the patient was placed prone.  C arm was brought in in good visualization of the affected level obtained on both AP and lateral projections.  After patient identification and timeout procedures were completed, local anesthetic was infiltrated with 10 cc 1% Xylocaine infiltrated subcutaneously.  This is done the area on the each side of the planned approach.  The back was then prepped and draped in the usual sterile manner and repeat timeout procedure carried out.  A spinal needle was brought down to the pedicle on the each side of  L2 and a 50-50 mix of 1% Xylocaine half percent Sensorcaine with epinephrine total of 20 cc injected.  After allowing this to set a small incision was made and the trocar was advanced into the vertebral body in an extrapedicular fashion.  Biopsy was obtained Drilling was carried out balloon inserted with inflation to  total of 6-1/2 cc.  When the cement was appropriate consistency 7-1/2 cc were injected into the vertebral body without extravasation, good fill superior to inferior endplates and from right to left sides along the inferior endplate.  After the cement had set the trochar was removed and permanent C-arm views obtained.  The wound was  closed with Dermabond followed by Band-Aid   PLAN OF CARE:  Continue as inpatient   PATIENT DISPOSITION:  PACU - hemodynamically stable.

## 2018-10-31 NOTE — Progress Notes (Signed)
[facility] [place] NAME: Arthur [name]    MR#:  [id number]  DATE OF BIRTH:  1933-08-18  SUBJECTIVE:  CHIEF COMPLAINT:   Chief Complaint  Patient presents with  . Back Pain   Patient was seen after kyphoplasty out of bed to chair with physical therapy Daughter at bedside afebrile. Appears to be in good spirits REVIEW OF SYSTEMS:    Review of Systems  Constitutional: Positive for malaise/fatigue. Negative for fever.  HENT: Negative for sore throat.   Eyes: Negative for blurred vision, double vision and pain.  Respiratory: Negative for cough, hemoptysis, shortness of breath and wheezing.   Cardiovascular: Negative for chest pain, palpitations, orthopnea and leg swelling.  Gastrointestinal: Negative for abdominal pain, constipation, diarrhea, heartburn, nausea and vomiting.  Genitourinary: Negative for dysuria and hematuria.  Musculoskeletal: Positive for back pain. Negative for joint pain.  Skin: Negative for rash.  Neurological: Negative for sensory change, speech change, focal weakness and headaches.  Endo/Heme/Allergies: Does not bruise/bleed easily.  Psychiatric/Behavioral: Negative for depression. The patient is not nervous/anxious.     DRUG ALLERGIES:   Allergies  Allergen Reactions  . Codeine Other (See Comments)    Constipation    VITALS:  Blood pressure (!) 180/72, pulse 80, temperature 98.6 F (37 C), temperature source Oral, resp. rate 18, height 5\' 8"  (1.727 m), weight 78.4 kg, SpO2 98 %.  PHYSICAL EXAMINATION:   Physical Exam  GENERAL:  85 y.o.-year-old patient lying in the bed with no acute distress.  EYES: Pupils equal, round, reactive to light and accommodation. No scleral icterus. Extraocular muscles intact.  HEENT: Head atraumatic, normocephalic. Oropharynx and nasopharynx clear.  NECK:  Supple, no jugular venous distention. No thyroid enlargement, no tenderness.  LUNGS: Normal breath sounds bilaterally, no  wheezing, rales, rhonchi. No use of accessory muscles of respiration.  CARDIOVASCULAR: S1, S2 normal. No murmurs, rubs, or gallops.  ABDOMEN: Soft, nontender, nondistended. Bowel sounds present. No organomegaly or mass.  EXTREMITIES: No cyanosis, clubbing or edema b/l.    NEUROLOGIC: Cranial nerves II through XII are intact. No focal Motor or sensory deficits b/l.   PSYCHIATRIC: The patient is alert and oriented x 3.  SKIN: No obvious rash, lesion, or ulcer.   LABORATORY PANEL:   CBC Recent Labs  Lab 10/31/18 1244  WBC 8.6  HGB 9.8*  HCT 30.9*  PLT 315   ------------------------------------------------------------------------------------------------------------------ Chemistries  Recent Labs  Lab 10/27/18 1550  10/29/18 0547 10/31/18 1244  NA 135   < > 136  --   K 4.3   < > 4.2  --   CL 100   < > 102  --   CO2 22   < > 26  --   GLUCOSE 160*   < > 109*  --   BUN 33*   < > 31*  --   CREATININE 2.41*   < > 2.58* 2.26*  CALCIUM 8.7*   < > 8.4*  --   AST 15  --   --   --   ALT 13  --   --   --   ALKPHOS 69  --   --   --   BILITOT 1.0  --   --   --    < > = values in this interval not displayed.   ------------------------------------------------------------------------------------------------------------------  Cardiac Enzymes No results for input(s): TROPONINI in the last 168 hours. ------------------------------------------------------------------------------------------------------------------  RADIOLOGY:  Dg Lumbar Spine 2-3 Views  Result Date:  10/31/2018 CLINICAL DATA:  L2 compression fracture. Status post kyphoplasty. EXAM: LUMBAR SPINE - 2-3 VIEW; DG C-ARM 1-60 MIN COMPARISON:  MRI dated 10/28/2018 FINDINGS: AP and lateral C-arm images demonstrate the patient has undergone kyphoplasty of L2. IMPRESSION: Kyphoplasty of L2. FLUOROSCOPY TIME:  2 minutes 26 seconds C-arm fluoroscopic images were obtained intraoperatively and submitted for post operative interpretation.  Electronically Signed   By: Lorriane Shire M.D.   On: 10/31/2018 11:14   Dg C-arm 1-60 Min  Result Date: 10/31/2018 CLINICAL DATA:  L2 compression fracture. Status post kyphoplasty. EXAM: LUMBAR SPINE - 2-3 VIEW; DG C-ARM 1-60 MIN COMPARISON:  MRI dated 10/28/2018 FINDINGS: AP and lateral C-arm images demonstrate the patient has undergone kyphoplasty of L2. IMPRESSION: Kyphoplasty of L2. FLUOROSCOPY TIME:  2 minutes 26 seconds C-arm fluoroscopic images were obtained intraoperatively and submitted for post operative interpretation. Electronically Signed   By: Lorriane Shire M.D.   On: 10/31/2018 11:14     ASSESSMENT AND PLAN:   * UTI with  sepsis on admission now resolved Urine cultures enterobacter Cloacae greater than 100,000 colonies - was on IV ceftriaxone--change to po cipro Sepsis has resolved.  Leukocytosis resolved  * L2 compression fracture with intractable pain Consulted Dr. Marylynn Pearson had kyphoplasty 10/31/2018 patient tolerated procedure well Recommend is recommending out of bed to chair only today and PT evaluation tomorrow   * Hypertension -blood pressure continue amlodipine, dose increased, extra dose of amlodipine given IV Lopressor as needed   *  Type 2 diabetes Basal insulin coverage and sliding scale insulin  *  BPH.  Flomax will be resumed.  *  COPD.    Nebulizers as needed  *  DVT prophylaxis.  Subcutaneous Lovenox.  Spoke with dter [name] on the phone today and updated  All the records are reviewed and case discussed with Care Management/Social Worker Management plans discussed with the patient and daughter at bedside they are in agreement.  CODE STATUS: DNR/DNI  TOTAL TIME TAKING CARE OF THIS PATIENT: 34 minutes.   POSSIBLE D/C IN 1-2 DAYS, DEPENDING ON CLINICAL CONDITION.  Nicholes Mango M.D on 10/31/2018 at 4:24 PM  Between 7am to 6pm - Pager - [phone]  After 6pm go to [email or web address] - password [email or web address] [facility]   Office  [phone]  CC: Primary care physician; Baxter Hire, MD  Note: This dictation was prepared with Dragon dictation along with smaller phrase technology. Any transcriptional errors that result from this process are unintentional.

## 2018-10-31 NOTE — Anesthesia Post-op Follow-up Note (Signed)
Anesthesia QCDR form completed.        

## 2018-10-31 NOTE — Anesthesia Postprocedure Evaluation (Signed)
Anesthesia Post Note  Patient: Arthur [name]  Procedure(s) Performed: KYPHOPLASTY L2 (N/A )  Patient location during evaluation: PACU Anesthesia Type: General Level of consciousness: awake and alert Pain management: pain level controlled Vital Signs Assessment: post-procedure vital signs reviewed and stable Respiratory status: respiratory function stable and spontaneous breathing Cardiovascular status: stable Anesthetic complications: no     Last Vitals:  Vitals:   10/31/18 1104 10/31/18 1116  BP: (!) 122/52 123/62  Pulse: 97 92  Resp: 19 (!) 24  Temp:    SpO2: 100% 100%    Last Pain:  Vitals:   10/31/18 1116  TempSrc:   PainSc: 0-No pain                 Tyniya Kuyper K

## 2018-10-31 NOTE — Progress Notes (Signed)
Patient continues to have low back pain, not too bad when lying still, unable to get out of bed though.  Plan L2 kyphoplasty this am.

## 2018-10-31 NOTE — Evaluation (Signed)
Physical Therapy Evaluation Patient Details Name: Arthur [name] MRN: [id number] DOB: 12/05/1933 Today's Date: 10/31/2018   History of Present Illness  Per MD:  Pt  is an 85 y.o. male with a known history of CHF, stage III-IV chronic kidney disease, diabetes mellitus, COPD and hypertension, who presented to the emergency room with acute onset of back pain with urinary urgency and foul-smelling urine. CT scan revealed L2 compression fracture and signs of cystitis and mesenteritis as well as right pleural effusion with atelectasis as mentioned below. S/P L2 kyphoplasty 10/29.  Clinical Impression  Pt presented with deficits in strength, transfers, mobility, gait, balance, and activity tolerance. Pt's pain was 0/10 at rest, and grimaced and groaned with log rolling and anterior leaning at EOB, but did not quantify pain. Pt required some active assistance/tactile cuing to start bed exercises, but was able to complete them Ind. Pt followed verbal and tactile cues for bed mobility, and only required mod A to sit up to EOB. Pt completed sit<>stand transfer with min A and forward and backward steps to get from EOB to recliner with CGA and verbal cuing throughout. Pt will benefit from PT services in a SNF setting upon discharge to safely address above deficits for decreased caregiver assistance and eventual return to PLOF.     Follow Up Recommendations SNF    Equipment Recommendations  None recommended by PT    Recommendations for Other Services       Precautions / Restrictions Precautions Precaution Comments: High fall risk Restrictions Weight Bearing Restrictions: No Other Position/Activity Restrictions: Advise pt to slowly lower into sitting to minimize impact on L-spine.      Mobility  Bed Mobility Overal bed mobility: Needs Assistance Bed Mobility: Rolling;Supine to Sit;Sit to Supine Rolling: Min assist   Supine to sit: Mod assist     General bed mobility comments: Pt movements were  slow and effortful, required min to mod verbal and tactile cuing to come to EOB.  Transfers Overall transfer level: Needs assistance Equipment used: Rolling walker (2 wheeled) Transfers: Sit to/from Stand Sit to Stand: Min assist         General transfer comment: Pt required mod cuing to setup for sit<>stand transfers and used good concentric and eccentric control during movement.  Ambulation/Gait Ambulation/Gait assistance: Min guard Gait Distance (Feet): 6 Feet Assistive device: Rolling walker (2 wheeled) Gait Pattern/deviations: Step-to pattern;Decreased step length - right;Decreased step length - left;Shuffle Gait velocity: Decreased   General Gait Details: Pt took a few short, low-clearance steps forward away from bed and backward to recliner with CGA and mod verbal cuing.  Stairs            Wheelchair Mobility    Modified Rankin (Stroke Patients Only)       Balance Overall balance assessment: Needs assistance Sitting-balance support: No upper extremity supported;Feet unsupported Sitting balance-Leahy Scale: Good Sitting balance - Comments: Pt initially held on to R bed rail "out of habit", but with verbal cuing sat unsupported in dynamic sitting with multidirectional challenge.     Standing balance-Leahy Scale: Fair Standing balance comment: Heavy lean and flexed trunk into RW, required min to mod verbal cuing to correct posture.                             Pertinent Vitals/Pain Pain Assessment: Faces Faces Pain Scale: Hurts a little bit Pain Location: Low back Pain Descriptors / Indicators: Aching;Guarding Pain Intervention(s): Limited activity  within patient's tolerance;Monitored during session;Repositioned    Home Living Family/patient expects to be discharged to:: Private residence Living Arrangements: Alone Available Help at Discharge: Family;Available PRN/intermittently Type of Home: House Home Access: Stairs to enter Entrance  Stairs-Rails: Can reach both;Left;Right Entrance Stairs-Number of Steps: 8 Home Layout: One level Home Equipment: Cane - single point;Walker - 2 wheels;Walker - 4 wheels;Bedside commode;Tub bench      Prior Function Level of Independence: Independent with assistive device(s)         Comments: Limited household ambulation with RW; Ind with most ADLs, daughter provides daily check in and assist with groceries, laundry, errands.  Daughter, [name], states that he's fallen twice in the house in the last 6 months.     Hand Dominance        Extremity/Trunk Assessment   Upper Extremity Assessment Upper Extremity Assessment: Generalized weakness    Lower Extremity Assessment Lower Extremity Assessment: Generalized weakness    Cervical / Trunk Assessment Cervical / Trunk Assessment: Normal  Communication   Communication: No difficulties  Cognition Arousal/Alertness: Lethargic Behavior During Therapy: Flat affect Overall Cognitive Status: Within Functional Limits for tasks assessed                                 General Comments: Pt was initially lethargic, requiring frequent verbal cues to keep pt eyes open and keep him involved in therapy, but upon sitting up to EOB through to end of session pt was more engaged and responsive.      General Comments      Exercises Total Joint Exercises Ankle Circles/Pumps: AROM;Strengthening;Both;10 reps Quad Sets: AROM;Strengthening;Both;10 reps Gluteal Sets: AROM;Strengthening;Both;10 reps Heel Slides: AROM;Strengthening;Both;5 reps Hip ABduction/ADduction: AROM;Strengthening;Both;10 reps Long Arc Quad: AROM;Strengthening;Both;10 reps Knee Flexion: AROM;Strengthening;Both;10 reps Marching in Standing: AROM;Strengthening;Both;5 reps;10 reps Other Exercises Other Exercises: Pt educated on log rolling to get to EOB.   Assessment/Plan    PT Assessment Patient needs continued PT services  PT Problem List Decreased  strength;Decreased range of motion;Decreased activity tolerance;Decreased balance;Decreased mobility       PT Treatment Interventions DME instruction;Gait training;Stair training;Functional mobility training;Therapeutic exercise;Balance training;Patient/family education;Therapeutic activities    PT Goals (Current goals can be found in the Care Plan section)  Acute Rehab PT Goals Patient Stated Goal: Get back to walking PT Goal Formulation: With patient/family Time For Goal Achievement: 11/13/18 Potential to Achieve Goals: Good    Frequency 7X/week   Barriers to discharge        Co-evaluation               AM-PAC PT "6 Clicks" Mobility  Outcome Measure Help needed turning from your back to your side while in a flat bed without using bedrails?: A Little Help needed moving from lying on your back to sitting on the side of a flat bed without using bedrails?: A Lot Help needed moving to and from a bed to a chair (including a wheelchair)?: A Little Help needed standing up from a chair using your arms (e.g., wheelchair or bedside chair)?: A Little Help needed to walk in hospital room?: A Lot Help needed climbing 3-5 steps with a railing? : Total 6 Click Score: 14    End of Session Equipment Utilized During Treatment: Gait belt Activity Tolerance: Patient limited by pain Patient left: in chair;with call bell/phone within reach;with chair alarm set;with family/visitor present Nurse Communication: Mobility status PT Visit Diagnosis: Unsteadiness on feet (R26.81);Other abnormalities of  gait and mobility (R26.89);Muscle weakness (generalized) (M62.81)    Time: 7680-8811 PT Time Calculation (min) (ACUTE ONLY): 38 min   Charges:            Juanda Crumble "Gus" Ovide Dusek, SPT  10/31/18, 5:29 PM

## 2018-11-01 ENCOUNTER — Encounter: Payer: Self-pay | Admitting: Orthopedic Surgery

## 2018-11-01 LAB — GLUCOSE, CAPILLARY
Glucose-Capillary: 141 mg/dL — ABNORMAL HIGH (ref 70–99)
Glucose-Capillary: 168 mg/dL — ABNORMAL HIGH (ref 70–99)
Glucose-Capillary: 79 mg/dL (ref 70–99)
Glucose-Capillary: 169 mg/dL — ABNORMAL HIGH (ref 70–99)

## 2018-11-01 MED ORDER — BISACODYL 10 MG RE SUPP
10.0000 mg | Freq: Every day | RECTAL | Status: DC | PRN
  Administered 2018-11-01: 10 mg via RECTAL
  Filled 2018-11-01: qty 1

## 2018-11-01 MED ORDER — FLEET ENEMA 7-19 GM/118ML RE ENEM: 1.0000 | ENEMA | Freq: Every day | RECTAL | Status: DC | PRN

## 2018-11-01 NOTE — Progress Notes (Signed)
PT Cancellation Note  Patient Details Name: Arthur [name] MRN: [id number] DOB: 1933/06/22   Cancelled Treatment:    Reason Eval/Treat Not Completed: Fatigue/lethargy limiting ability to participate   Offered and encouraged therapy participation but pt declined stating he was tired.  Reports improved back pain.  Pt had removed O2 and sats 86% on room air.  Replaced O2 and tech aware.  Education provided regarding importance of mobility but he continued to decline.  Will attempt later as time allows.   Chesley Noon 11/01/2018, 12:39 PM

## 2018-11-01 NOTE — Progress Notes (Signed)
[facility] [place] NAME: Arthur [name]    MR#:  [id number]  DATE OF BIRTH:  Nov 18, 1933  SUBJECTIVE:  CHIEF COMPLAINT:   Chief Complaint  Patient presents with  . Back Pain   Patient is resting comfortably Patient is agreeable to go to rehabilitation center as recommended with physical therapy  REVIEW OF SYSTEMS:    Review of Systems  Constitutional: Positive for malaise/fatigue. Negative for fever.  HENT: Negative for sore throat.   Eyes: Negative for blurred vision, double vision and pain.  Respiratory: Negative for cough, hemoptysis, shortness of breath and wheezing.   Cardiovascular: Negative for chest pain, palpitations, orthopnea and leg swelling.  Gastrointestinal: Negative for abdominal pain, constipation, diarrhea, heartburn, nausea and vomiting.  Genitourinary: Negative for dysuria and hematuria.  Musculoskeletal: Positive for back pain. Negative for joint pain.  Skin: Negative for rash.  Neurological: Negative for sensory change, speech change, focal weakness and headaches.  Endo/Heme/Allergies: Does not bruise/bleed easily.  Psychiatric/Behavioral: Negative for depression. The patient is not nervous/anxious.     DRUG ALLERGIES:   Allergies  Allergen Reactions  . Codeine Other (See Comments)    Constipation    VITALS:  Blood pressure (!) 165/62, pulse 82, temperature 97.7 F (36.5 C), temperature source Oral, resp. rate 20, height 5\' 8"  (1.727 m), weight 78.4 kg, SpO2 98 %.  PHYSICAL EXAMINATION:   Physical Exam  GENERAL:  85 y.o.-year-old patient lying in the bed with no acute distress.  EYES: Pupils equal, round, reactive to light and accommodation. No scleral icterus. Extraocular muscles intact.  HEENT: Head atraumatic, normocephalic. Oropharynx and nasopharynx clear.  NECK:  Supple, no jugular venous distention. No thyroid enlargement, no tenderness.  LUNGS: Normal breath sounds bilaterally, no wheezing, rales,  rhonchi. No use of accessory muscles of respiration.  CARDIOVASCULAR: S1, S2 normal. No murmurs, rubs, or gallops.  ABDOMEN: Soft, nontender, nondistended. Bowel sounds present. No organomegaly or mass.  EXTREMITIES: No cyanosis, clubbing or edema b/l.    NEUROLOGIC: Cranial nerves II through XII are intact. No focal Motor or sensory deficits b/l.   PSYCHIATRIC: The patient is alert and oriented x 3.  SKIN: No obvious rash, lesion, or ulcer.   LABORATORY PANEL:   CBC Recent Labs  Lab 10/31/18 1244  WBC 8.6  HGB 9.8*  HCT 30.9*  PLT 315   ------------------------------------------------------------------------------------------------------------------ Chemistries  Recent Labs  Lab 10/27/18 1550  10/29/18 0547 10/31/18 1244  NA 135   < > 136  --   K 4.3   < > 4.2  --   CL 100   < > 102  --   CO2 22   < > 26  --   GLUCOSE 160*   < > 109*  --   BUN 33*   < > 31*  --   CREATININE 2.41*   < > 2.58* 2.26*  CALCIUM 8.7*   < > 8.4*  --   AST 15  --   --   --   ALT 13  --   --   --   ALKPHOS 69  --   --   --   BILITOT 1.0  --   --   --    < > = values in this interval not displayed.   ------------------------------------------------------------------------------------------------------------------  Cardiac Enzymes No results for input(s): TROPONINI in the last 168 hours. ------------------------------------------------------------------------------------------------------------------  RADIOLOGY:  Dg Lumbar Spine 2-3 Views  Result Date: 10/31/2018 CLINICAL DATA:  L2  compression fracture. Status post kyphoplasty. EXAM: LUMBAR SPINE - 2-3 VIEW; DG C-ARM 1-60 MIN COMPARISON:  MRI dated 10/28/2018 FINDINGS: AP and lateral C-arm images demonstrate the patient has undergone kyphoplasty of L2. IMPRESSION: Kyphoplasty of L2. FLUOROSCOPY TIME:  2 minutes 26 seconds C-arm fluoroscopic images were obtained intraoperatively and submitted for post operative interpretation. Electronically  Signed   By: Lorriane Shire M.D.   On: 10/31/2018 11:14   Dg C-arm 1-60 Min  Result Date: 10/31/2018 CLINICAL DATA:  L2 compression fracture. Status post kyphoplasty. EXAM: LUMBAR SPINE - 2-3 VIEW; DG C-ARM 1-60 MIN COMPARISON:  MRI dated 10/28/2018 FINDINGS: AP and lateral C-arm images demonstrate the patient has undergone kyphoplasty of L2. IMPRESSION: Kyphoplasty of L2. FLUOROSCOPY TIME:  2 minutes 26 seconds C-arm fluoroscopic images were obtained intraoperatively and submitted for post operative interpretation. Electronically Signed   By: Lorriane Shire M.D.   On: 10/31/2018 11:14     ASSESSMENT AND PLAN:   * UTI with  sepsis on admission now resolved Urine cultures enterobacter Cloacae greater than 100,000 colonies - was on IV ceftriaxone--change to po cipro Sepsis has resolved.  Leukocytosis resolved  * L2 compression fracture with intractable pain Consulted Dr. Marylynn Pearson had kyphoplasty 10/31/2018 patient tolerated procedure well PT is recommending skilled nursing facility and patient is agreeable insurance authorization is pending  * Hypertension -blood pressure continue amlodipine, dose increased, extra dose of amlodipine given IV Lopressor as needed  *Chronic kidney disease stage III Patient seems to be at his baseline continue close monitoring and avoid nephrotoxins  *  Type 2 diabetes Basal insulin coverage and sliding scale insulin  *  BPH.  Flomax will be resumed.  *  COPD.    Nebulizers as needed  *  DVT prophylaxis.  Subcutaneous Lovenox.  Disposition skilled nursing facility when bed is available, insurance authorization is pending, does not get approval during the weekend, please follow-up with case manager on Monday  All the records are reviewed and case discussed with Care Management/Social Worker Management plans discussed with the patient and daughter at bedside they are in agreement.  CODE STATUS: DNR/DNI  TOTAL TIME TAKING CARE OF THIS  PATIENT: 33 minutes.   POSSIBLE D/C IN 1-2 DAYS, DEPENDING ON CLINICAL CONDITION.  Nicholes Mango M.D on 11/01/2018 at 6:38 PM  Between 7am to 6pm - Pager - [phone]  After 6pm go to [email or web address] - password [email or web address] [facility]  Office  [phone]  CC: Primary care physician; Baxter Hire, MD  Note: This dictation was prepared with Dragon dictation along with smaller phrase technology. Any transcriptional errors that result from this process are unintentional.

## 2018-11-01 NOTE — Care Management Important Message (Signed)
Important Message  Patient Details  Name: Arthur [name] MRN: [id number] Date of Birth: February 03, 1933   Medicare Important Message Given:  Yes     [name] 11/01/2018, 12:50 PM

## 2018-11-01 NOTE — Progress Notes (Signed)
Physical Therapy Treatment Patient Details Name: Arthur [name] MRN: [id number] DOB: 14-May-1933 Today's Date: 11/01/2018    History of Present Illness Per MD:  Pt  is an 85 y.o. male with a known history of CHF, stage III-IV chronic kidney disease, diabetes mellitus, COPD and hypertension, who presented to the emergency room with acute onset of back pain with urinary urgency and foul-smelling urine. CT scan revealed L2 compression fracture and signs of cystitis and mesenteritis as well as right pleural effusion with atelectasis as mentioned below. S/P L2 kyphoplasty 10/29.    PT Comments    On 3rd attempt, pt on commode at bedside.  Stood with min a x 1 with vc's for hand placements.  Stood for care by nursing tech.  Transferred to bed with min guard/assist +1.  Sat for a brief period while commode was removed and he resisted sitting in recliner due to back pain but agreed with encouragement.  He was able to transfer to recliner with similar assist.  Pt stated back pain was better in chair.  Encouraged standing exercises but he declined.     Follow Up Recommendations  SNF     Equipment Recommendations  None recommended by PT    Recommendations for Other Services       Precautions / Restrictions Precautions Precaution Comments: High fall risk Restrictions Weight Bearing Restrictions: No Other Position/Activity Restrictions: Advise pt to slowly lower into sitting to minimize impact on L-spine.    Mobility  Bed Mobility               General bed mobility comments: pt on commode upon arrival and remained in recliner after session  Transfers Overall transfer level: Needs assistance Equipment used: Rolling walker (2 wheeled) Transfers: Sit to/from Stand Sit to Stand: Min assist         General transfer comment: Pt required mod cuing to setup for sit<>stand transfers and used good concentric and eccentric control during movement.  Ambulation/Gait Ambulation/Gait  assistance: Min guard Gait Distance (Feet): 6 Feet Assistive device: Rolling walker (2 wheeled) Gait Pattern/deviations: Decreased step length - right;Decreased step length - left;Step-through pattern;Trunk flexed Gait velocity: Decreased       Stairs             Wheelchair Mobility    Modified Rankin (Stroke Patients Only)       Balance Overall balance assessment: Needs assistance Sitting-balance support: Feet supported Sitting balance-Leahy Scale: Good     Standing balance support: Bilateral upper extremity supported Standing balance-Leahy Scale: Fair Standing balance comment: Heavy lean and flexed trunk into RW, required min to mod verbal cuing to correct posture.                            Cognition Arousal/Alertness: Awake/alert   Overall Cognitive Status: Within Functional Limits for tasks assessed                                        Exercises Other Exercises Other Exercises: comode for BM.  care by tech provided    General Comments        Pertinent Vitals/Pain Pain Assessment: Faces Faces Pain Scale: Hurts little more Pain Descriptors / Indicators: Aching;Guarding Pain Intervention(s): Limited activity within patient's tolerance;Monitored during session;Repositioned    Home Living  Prior Function            PT Goals (current goals can now be found in the care plan section) Progress towards PT goals: Progressing toward goals    Frequency    7X/week      PT Plan Current plan remains appropriate    Co-evaluation              AM-PAC PT "6 Clicks" Mobility   Outcome Measure  Help needed turning from your back to your side while in a flat bed without using bedrails?: A Little Help needed moving from lying on your back to sitting on the side of a flat bed without using bedrails?: A Little Help needed moving to and from a bed to a chair (including a wheelchair)?: A  Little Help needed standing up from a chair using your arms (e.g., wheelchair or bedside chair)?: A Little Help needed to walk in hospital room?: A Little Help needed climbing 3-5 steps with a railing? : A Lot 6 Click Score: 17    End of Session Equipment Utilized During Treatment: Gait belt Activity Tolerance: Patient limited by pain Patient left: in chair;with call bell/phone within reach;with chair alarm set Nurse Communication: Mobility status       Time: 1410-1418 PT Time Calculation (min) (ACUTE ONLY): 8 min  Charges:  $Therapeutic Activity: 8-22 mins                     Chesley Noon, PTA 11/01/18, 3:12 PM

## 2018-11-01 NOTE — NC FL2 (Signed)
[place] LEVEL OF CARE SCREENING TOOL     IDENTIFICATION  Patient Name: Arthur [name] Birthdate: 1933-08-21 Sex: male Admission Date (Current Location): 10/27/2018  [facility] and [place] Number:  Engineering geologist and Address:         Provider Number: [id number]  Attending Physician Name and Address:  Nicholes Mango, MD  Relative Name and Phone Number:       Current Level of Care: Hospital Recommended Level of Care: [place] Prior Approval Number:    Date Approved/Denied:   PASRR Number: [id number]  Discharge Plan: SNF    Current Diagnoses: Patient Active Problem List   Diagnosis Date Noted  . Anemia of chronic kidney failure 08/21/2018  . UTI (urinary tract infection) 08/07/2018  . Acute on chronic renal failure ([place]) 08/07/2018  . HLD (hyperlipidemia) 07/24/2018  . Acute on chronic respiratory failure with hypoxia ([place]) 07/06/2018  . Severe anemia 06/27/2018  . GI bleeding 06/09/2018  . Sepsis ([place]) 05/23/2018  . Cellulitis of right lower extremity 05/22/2018  . Diabetes ([place]) 10/24/2006  . OBESITY 10/24/2006  . Essential hypertension 10/24/2006  . ALLERGIC RHINITIS 10/24/2006  . COPD (chronic obstructive pulmonary disease) ([place]) 10/24/2006  . TOBACCO ABUSE, HX OF 10/24/2006    Orientation RESPIRATION BLADDER Height & Weight     Self, Time, Situation  O2(2L [place]) Continent Weight: 78.4 kg Height:  5\' 8"  (172.7 cm)  BEHAVIORAL SYMPTOMS/MOOD NEUROLOGICAL BOWEL NUTRITION STATUS      Continent Diet(carb modified)  AMBULATORY STATUS COMMUNICATION OF NEEDS Skin   Extensive Assist Verbally Surgical wounds                       Personal Care Assistance Level of Assistance              Functional Limitations Info             SPECIAL CARE FACTORS FREQUENCY  PT (By licensed PT), OT (By licensed OT)                    Contractures Contractures Info: Not present    Additional Factors Info  Code Status,  Allergies Code Status Info: full Allergies Info: codeine           Current Medications (11/01/2018):  This is the current hospital active medication list Current Facility-Administered Medications  Medication Dose Route Frequency Provider Last Rate Last Dose  . acetaminophen (TYLENOL) tablet 650 mg  650 mg Oral Q6H PRN [name], MD   650 mg at 10/28/18 2112   Or  . acetaminophen (TYLENOL) suppository 650 mg  650 mg Rectal Q6H PRN [name], MD      . amLODipine (NORVASC) tablet 5 mg  5 mg Oral Daily Gouru, Aruna, MD   5 mg at 11/01/18 0853  . bisacodyl (DULCOLAX) suppository 10 mg  10 mg Rectal Daily PRN [name], MD      . bisacodyl (DULCOLAX) suppository 10 mg  10 mg Rectal Daily PRN Gouru, Aruna, MD      . ceFAZolin (ANCEF) IVPB 1 g/50 mL premix  1 g Intravenous Once [name], MD      . ciprofloxacin (CIPRO) tablet 250 mg  250 mg Oral BID [name], MD   250 mg at 11/01/18 7035  . docusate sodium (COLACE) capsule 100 mg  100 mg Oral BID [name], MD      . enoxaparin (LOVENOX) injection 30 mg  30 mg  Subcutaneous Q24H [name], MD   30 mg at 11/01/18 0856  . insulin aspart (novoLOG) injection 0-15 Units  0-15 Units Subcutaneous TID WC [name], MD   5 Units at 11/01/18 [phone]  . insulin aspart (novoLOG) injection 5 Units  5 Units Subcutaneous TID WC [name], MD   5 Units at 11/01/18 [phone]  . insulin detemir (LEVEMIR) injection 12 Units  12 Units Subcutaneous Daily [name], MD   12 Units at 11/01/18 [phone]  . ipratropium-albuterol (DUONEB) 0.5-2.5 (3) MG/3ML nebulizer solution 3 mL  3 mL Nebulization Q6H PRN [name], MD      . iron polysaccharides (NIFEREX) capsule 150 mg  150 mg Oral Daily [name], MD   150 mg at 11/01/18 2841  . magnesium citrate solution 1 Bottle  1 Bottle Oral Once PRN [name], MD      . magnesium hydroxide (MILK OF MAGNESIA) suspension 30 mL  30 mL Oral Daily PRN [name], MD      . metoCLOPramide  (REGLAN) tablet 5-10 mg  5-10 mg Oral Q8H PRN [name], MD       Or  . metoCLOPramide (REGLAN) injection 5-10 mg  5-10 mg Intravenous Q8H PRN [name], MD      . metoprolol tartrate (LOPRESSOR) injection 5 mg  5 mg Intravenous Q4H PRN Gouru, Aruna, MD      . ondansetron (ZOFRAN) tablet 4 mg  4 mg Oral Q6H PRN [name], MD       Or  . ondansetron [facility]) injection 4 mg  4 mg Intravenous Q6H PRN [name], MD   4 mg at 10/30/18 1641  . pantoprazole (PROTONIX) EC tablet 40 mg  40 mg Oral BID [name], MD   40 mg at 11/01/18 0853  . pravastatin (PRAVACHOL) tablet 10 mg  10 mg Oral QHS [name], MD   10 mg at 10/31/18 2100  . prochlorperazine (COMPAZINE) injection 5 mg  5 mg Intravenous Q4H PRN [name], MD   5 mg at 10/29/18 2139  . sodium phosphate (FLEET) 7-19 GM/118ML enema 1 enema  1 enema Rectal Daily PRN Gouru, Aruna, MD      . tamsulosin (FLOMAX) capsule 0.4 mg  0.4 mg Oral Daily [name], MD   0.4 mg at 11/01/18 0853  . torsemide (DEMADEX) tablet 20 mg  20 mg Oral Ceasar Lund, MD   20 mg at 11/01/18 0851  . traZODone (DESYREL) tablet 25 mg  25 mg Oral QHS PRN [name], MD   25 mg at 10/31/18 2055  . vitamin C (ASCORBIC ACID) tablet 500 mg  500 mg Oral Daily [name], MD   500 mg at 11/01/18 3244     Discharge Medications: Please see discharge summary for a list of discharge medications.  Relevant Imaging Results:  Relevant Lab Results:   Additional Information SSN: [id number]  Beverly Sessions, RN

## 2018-11-01 NOTE — TOC Progression Note (Signed)
Transition of Care [facility]) - Progression Note    Patient Details  Name: Arthur [name] MRN: [id number] Date of Birth: August 16, 1933  Transition of Care [facility]) CM/SW Contact  Beverly Sessions, RN Phone Number: 11/01/2018, 4:44 PM  Clinical Narrative:    Presented bed offers [facility] selected.  Magda Paganini at [facility].  She is aware that they will have to get authorization.  They do not accept patients over the weekend.    Expected Discharge Plan: Skilled Nursing Facility Barriers to Discharge: Continued Medical Work up  Expected Discharge Plan and Services Expected Discharge Plan: [place]   Discharge Planning Services: CM Consult   Living arrangements for the past 2 months: Single Family Home                                       Social Determinants of Health (SDOH) Interventions    Readmission Risk Interventions Readmission Risk Prevention Plan 10/29/2018 08/09/2018 07/26/2018  Transportation Screening Complete Complete Complete  PCP or Specialist Appt within 5-7 Days - - -  PCP or Specialist Appt within 3-5 Days - - Complete  Home Care Screening - - -  Medication Review (RN CM) - - -  [place] or [place] - - Complete  Social Work Consult for [place] Planning/Counseling - - Complete  Palliative Care Screening - - Not Applicable  Medication Review Press photographer) Complete Complete Complete  PCP or Specialist appointment within 3-5 days of discharge - Complete -  [place] or [place] - Complete -  SW Recovery Care/Counseling Consult - Complete -  [place] - Not Applicable -  Some recent data might be hidden

## 2018-11-01 NOTE — TOC Progression Note (Signed)
Transition of Care [facility]) - Progression Note    Patient Details  Name: Arthur [name] MRN: [id number] Date of Birth: 11-Mar-1933  [facility]) CM/SW Contact  Beverly Sessions, RN Phone Number: 11/01/2018, 10:51 AM  Clinical Narrative:     PT has assessed patient and recommends SNF.  Patient in agreement and request I speak with his daughter.  She is also in agreement.   Existing PASRR FL2 sent for signature Bed Search initiated  RNCM contact [facility], and confirmed that patient is NOT managed by [facility] Expected Discharge Plan: Skilled Nursing Facility Barriers to Discharge: Continued Medical Work up  Expected Discharge Plan and Services Expected Discharge Plan: [place]   Discharge Planning Services: CM Consult   Living arrangements for the past 2 months: Single Family Home                                       Social Determinants of Health (SDOH) Interventions    Readmission Risk Interventions Readmission Risk Prevention Plan 10/29/2018 08/09/2018 07/26/2018  Transportation Screening Complete Complete Complete  PCP or Specialist Appt within 5-7 Days - - -  PCP or Specialist Appt within 3-5 Days - - Complete  Home Care Screening - - -  Medication Review (RN CM) - - -  [place] or [place] - - Complete  Social Work Consult for [place] Planning/Counseling - - Complete  Palliative Care Screening - - Not Applicable  Medication Review Press photographer) Complete Complete Complete  PCP or Specialist appointment within 3-5 days of discharge - Complete -  [place] or [place] - Complete -  SW Recovery Care/Counseling Consult - Complete -  [place] - Not Applicable -  Some recent data might be hidden

## 2018-11-02 LAB — GLUCOSE, CAPILLARY
Glucose-Capillary: 127 mg/dL — ABNORMAL HIGH (ref 70–99)
Glucose-Capillary: 137 mg/dL — ABNORMAL HIGH (ref 70–99)
Glucose-Capillary: 120 mg/dL — ABNORMAL HIGH (ref 70–99)
Glucose-Capillary: 179 mg/dL — ABNORMAL HIGH (ref 70–99)

## 2018-11-02 NOTE — Progress Notes (Signed)
PT Cancellation Note  Patient Details Name: ALEXUS MICHAEL MRN: [id number] DOB: 12/09/1933   Cancelled Treatment:    Reason Eval/Treat Not Completed: Other (comment)  Upon entering room pt stated "I'm not walking now."  Re-introduced myself from yesterday and he again refused stating he has experiencing nausea and had just taken "a pill" for it.  Encouraged out of bed but he again declined "I'm comfortable where I am."  Will attempt again later as time allows.  Chesley Noon 11/02/2018, 11:15 AM

## 2018-11-02 NOTE — Progress Notes (Signed)
[place] [facility] [place] NAME: Arthur [name]    MR#:  [id number]  DATE OF BIRTH:  1933-08-31  SUBJECTIVE:  CHIEF COMPLAINT:   Chief Complaint  Patient presents with  . Back Pain   Patient is resting comfortably, back pain significantly improved Patient is agreeable to go to rehabilitation center as recommended on Monday no new complaints  REVIEW OF SYSTEMS:    Review of Systems  Constitutional: Positive for malaise/fatigue. Negative for fever.  HENT: Negative for sore throat.   Eyes: Negative for blurred vision, double vision and pain.  Respiratory: Negative for cough, hemoptysis, shortness of breath and wheezing.   Cardiovascular: Negative for chest pain, palpitations, orthopnea and leg swelling.  Gastrointestinal: Negative for abdominal pain, constipation, diarrhea, heartburn, nausea and vomiting.  Genitourinary: Negative for dysuria and hematuria.  Musculoskeletal: Positive for back pain. Negative for joint pain.  Skin: Negative for rash.  Neurological: Negative for sensory change, speech change, focal weakness and headaches.  Endo/Heme/Allergies: Does not bruise/bleed easily.  Psychiatric/Behavioral: Negative for depression. The patient is not nervous/anxious.     DRUG ALLERGIES:   Allergies  Allergen Reactions  . Codeine Other (See Comments)    Constipation    VITALS:  Blood pressure (!) 151/55, pulse 74, temperature 98.4 F (36.9 C), temperature source Oral, resp. rate 16, height 5\' 8"  (1.727 m), weight 78.4 kg, SpO2 93 %.  PHYSICAL EXAMINATION:   Physical Exam  GENERAL:  85 y.o.-year-old patient lying in the bed with no acute distress.  EYES: Pupils equal, round, reactive to light and accommodation. No scleral icterus. Extraocular muscles intact.  HEENT: Head atraumatic, normocephalic. Oropharynx and nasopharynx clear.  NECK:  Supple, no jugular venous distention. No thyroid enlargement, no tenderness.  LUNGS: Normal breath  sounds bilaterally, no wheezing, rales, rhonchi. No use of accessory muscles of respiration.  CARDIOVASCULAR: S1, S2 normal. No murmurs, rubs, or gallops.  ABDOMEN: Soft, nontender, nondistended. Bowel sounds present. No organomegaly or mass.  EXTREMITIES: No cyanosis, clubbing or edema b/l.    NEUROLOGIC: Cranial nerves II through XII are intact. No focal Motor or sensory deficits b/l.   PSYCHIATRIC: The patient is alert and oriented x 3.  SKIN: No obvious rash, lesion, or ulcer.   LABORATORY PANEL:   CBC Recent Labs  Lab 10/31/18 1244  WBC 8.6  HGB 9.8*  HCT 30.9*  PLT 315   ------------------------------------------------------------------------------------------------------------------ Chemistries  Recent Labs  Lab 10/27/18 1550  10/29/18 0547 10/31/18 1244  NA 135   < > 136  --   K 4.3   < > 4.2  --   CL 100   < > 102  --   CO2 22   < > 26  --   GLUCOSE 160*   < > 109*  --   BUN 33*   < > 31*  --   CREATININE 2.41*   < > 2.58* 2.26*  CALCIUM 8.7*   < > 8.4*  --   AST 15  --   --   --   ALT 13  --   --   --   ALKPHOS 69  --   --   --   BILITOT 1.0  --   --   --    < > = values in this interval not displayed.   ------------------------------------------------------------------------------------------------------------------  Cardiac Enzymes No results for input(s): TROPONINI in the last 168 hours. ------------------------------------------------------------------------------------------------------------------  RADIOLOGY:  No results found.   ASSESSMENT AND  PLAN:   * UTI with  sepsis on admission now resolved Urine cultures enterobacter Cloacae greater than 100,000 colonies - was on IV ceftriaxone--change to po cipro, stop antibiotics after completing the complete antibiotic course for total of 7 days Sepsis has resolved.  Leukocytosis resolved  * L2 compression fracture with intractable pain Consulted Dr. Marylynn Pearson had kyphoplasty 10/31/2018 patient  tolerated procedure well PT is recommending skilled nursing facility and patient is agreeable insurance authorization is pending anticipating on Monday  * Hypertension -blood pressure continue amlodipine, dose increased, extra dose of amlodipine given IV Lopressor as needed  *Chronic kidney disease stage III Patient seems to be at his baseline continue close monitoring and avoid nephrotoxins Check repeat labs on Monday  *  Type 2 diabetes Basal insulin coverage and sliding scale insulin Accu-Cheks are reasonably controlled-at 120- 150  *  BPH.  Flomax will be resumed.  *  COPD.    Nebulizers as needed  *  DVT prophylaxis.  Subcutaneous Lovenox.  Disposition skilled nursing facility when bed is available, insurance authorization is pending, does not get approval during the weekend, please follow-up with case manager on Monday  All the records are reviewed and case discussed with Care Management/Social Worker Management plans discussed with the patient and daughter at bedside they are in agreement.  CODE STATUS: DNR/DNI  TOTAL TIME TAKING CARE OF THIS PATIENT: 25  minutes.   POSSIBLE D/C IN 1-2 DAYS, DEPENDING ON CLINICAL CONDITION.  Nicholes Mango M.D on 11/02/2018 at 1:26 PM  Between 7am to 6pm - Pager - [phone]  After 6pm go to [email or web address] - password [email or web address] [place]  Office  [phone]  CC: Primary care physician; Baxter Hire, MD  Note: This dictation was prepared with Dragon dictation along with smaller phrase technology. Any transcriptional errors that result from this process are unintentional.

## 2018-11-03 LAB — GLUCOSE, CAPILLARY
Glucose-Capillary: 134 mg/dL — ABNORMAL HIGH (ref 70–99)
Glucose-Capillary: 190 mg/dL — ABNORMAL HIGH (ref 70–99)
Glucose-Capillary: 78 mg/dL (ref 70–99)
Glucose-Capillary: 107 mg/dL — ABNORMAL HIGH (ref 70–99)

## 2018-11-03 MED ORDER — IPRATROPIUM-ALBUTEROL 0.5-2.5 (3) MG/3ML IN SOLN
3.0000 mL | Freq: Four times a day (QID) | RESPIRATORY_TRACT | Status: DC
  Administered 2018-11-03 – 2018-11-04 (×4): 3 mL via RESPIRATORY_TRACT
  Filled 2018-11-03 (×4): qty 3

## 2018-11-03 MED ORDER — MOMETASONE FURO-FORMOTEROL FUM 100-5 MCG/ACT IN AERO
2.0000 | INHALATION_SPRAY | Freq: Two times a day (BID) | RESPIRATORY_TRACT | Status: DC
  Administered 2018-11-03 – 2018-11-05 (×5): 2 via RESPIRATORY_TRACT
  Filled 2018-11-03: qty 8.8

## 2018-11-03 NOTE — Progress Notes (Signed)
Physical Therapy Treatment Patient Details Name: Arthur [name] MRN: [id number] DOB: 06-13-1933 Today's Date: 11/03/2018    History of Present Illness Per MD:  Pt  is an 85 y.o. male with a known history of CHF, stage III-IV chronic kidney disease, diabetes mellitus, COPD and hypertension, who presented to the emergency room with acute onset of back pain with urinary urgency and foul-smelling urine. CT scan revealed L2 compression fracture and signs of cystitis and mesenteritis as well as right pleural effusion with atelectasis as mentioned below. S/P L2 kyphoplasty 10/29.    PT Comments    Pt able to progress to walking 100 ft with RW today.  Pt required min A for STS with RW and for bed mobility.    PT provided cues during ambulation for upright posture and monitored O2.  Pt did desat to 88% on room air and recovered with rest.  He reported LBP and fatigue at 75 ft, leaning on counter in hall.  PT educated regarding log roll technique but pt did not follow directions and went from sit directly to supine.   He desat again during bed mobility, pt able to bridge and use UE's to scoot up in bed with min A, so placed back on 2L of O2.  Pt will continue to benefit from skilled PT with focus on pain management, strength, tolerance to activity and safe functional mobility.   Follow Up Recommendations  SNF     Equipment Recommendations  None recommended by PT    Recommendations for Other Services       Precautions / Restrictions Precautions Precautions: Back Precaution Comments: High fall risk Restrictions Weight Bearing Restrictions: No Other Position/Activity Restrictions: Advise pt to slowly lower into sitting to minimize impact on L-spine.    Mobility  Bed Mobility Overal bed mobility: Needs Assistance Bed Mobility: Sit to Supine;Sit to Sidelying         Sit to sidelying: Min assist General bed mobility comments: Assisted with LE's and cued to pt lay on side, though he performed  more of a pivot to lay down in bed.  Assisted with scooting up in bed.  Pt is able to bridge and use UE's.  Transfers Overall transfer level: Needs assistance Equipment used: Rolling walker (2 wheeled) Transfers: Sit to/from Stand Sit to Stand: Min assist         General transfer comment: Slow to stand; min A needed to assist in initiation of rising from bedside.  Ambulation/Gait Ambulation/Gait assistance: Min guard Gait Distance (Feet): 100 Feet Assistive device: Rolling walker (2 wheeled)     Gait velocity interpretation: 1.31 - 2.62 ft/sec, indicative of limited community ambulator General Gait Details: Pt eager to walk but refusing to wear O2.  Cleared with RN since he doesn't wear it at home.  Pt very kyphotic and leaning over RW, corrects only briefly when cued.  Low foot clearance and decreased step length.  Stopped and leaned on counter, stating that his back was "hurting".  Pt O2 desat to 88% so PT cued pt to return to bed where he recovered to 92%.  Pt also desat during bed mobility so pt placed on 2L.   Stairs             Wheelchair Mobility    Modified Rankin (Stroke Patients Only)       Balance Overall balance assessment: Needs assistance Sitting-balance support: Feet supported Sitting balance-Leahy Scale: Good     Standing balance support: Bilateral upper extremity supported Standing  balance-Leahy Scale: Fair Standing balance comment: Very kyphotic with flexed posture and heavy reliance on RW.                            Cognition Arousal/Alertness: Awake/alert Behavior During Therapy: WFL for tasks assessed/performed;Restless Overall Cognitive Status: Within Functional Limits for tasks assessed                                 General Comments: Follows VC's, though sometimes hesitant.      Exercises Other Exercises Other Exercises: Time to monitor vitals x4 min    General Comments        Pertinent Vitals/Pain  Pain Assessment: No/denies pain    Home Living                      Prior Function            PT Goals (current goals can now be found in the care plan section) Acute Rehab PT Goals Patient Stated Goal: Get back to walking PT Goal Formulation: With patient/family Time For Goal Achievement: 11/13/18 Potential to Achieve Goals: Good Progress towards PT goals: Progressing toward goals    Frequency    7X/week      PT Plan Current plan remains appropriate    Co-evaluation              AM-PAC PT "6 Clicks" Mobility   Outcome Measure  Help needed turning from your back to your side while in a flat bed without using bedrails?: A Little Help needed moving from lying on your back to sitting on the side of a flat bed without using bedrails?: A Little Help needed moving to and from a bed to a chair (including a wheelchair)?: A Little Help needed standing up from a chair using your arms (e.g., wheelchair or bedside chair)?: A Little Help needed to walk in hospital room?: A Little Help needed climbing 3-5 steps with a railing? : A Lot 6 Click Score: 17    End of Session Equipment Utilized During Treatment: Gait belt Activity Tolerance: Patient limited by fatigue;Patient limited by pain Patient left: in chair;in bed;with bed alarm set Nurse Communication: Mobility status PT Visit Diagnosis: Unsteadiness on feet (R26.81);Muscle weakness (generalized) (M62.81);Difficulty in walking, not elsewhere classified (R26.2)     Time: 3295-1884 PT Time Calculation (min) (ACUTE ONLY): 16 min  Charges:  $Therapeutic Exercise: 8-22 mins                     Roxanne Gates, PT, DPT    Roxanne Gates 11/03/2018, 4:42 PM

## 2018-11-03 NOTE — Progress Notes (Signed)
Patient ID: Arthur [name], male   DOB: 10/04/1933, 85 y.o.   MRN: [id number] [facility] PROGRESS NOTE  Arthur [name] [id number]:[id number] DOB: Oct 11, 1933 DOA: 10/27/2018 PCP: Baxter Hire, MD  HPI/Subjective: Patient states that he has a slight cough.  He states had this for many many years.  He states he wears oxygen at home.  No back pain.  Still feels weak.  Objective: Vitals:   11/03/18 1131 11/03/18 1205  BP: (!) 131/48 (!) 131/46  Pulse:  71  Resp:    Temp:  98 F (36.7 C)  SpO2:  94%    Intake/Output Summary (Last 24 hours) at 11/03/2018 1554 Last data filed at 11/03/2018 0527 Gross per 24 hour  Intake -  Output 1350 ml  Net -1350 ml   Filed Weights   10/27/18 1344 10/27/18 1357 10/28/18 0157  Weight: 102.5 kg 102.5 kg 78.4 kg    ROS: Review of Systems  Constitutional: Positive for malaise/fatigue. Negative for chills and fever.  Eyes: Negative for blurred vision.  Respiratory: Negative for cough and shortness of breath.   Cardiovascular: Negative for chest pain.  Gastrointestinal: Negative for abdominal pain, constipation, diarrhea, nausea and vomiting.  Genitourinary: Negative for dysuria.  Musculoskeletal: Negative for joint pain.  Neurological: Negative for dizziness and headaches.   Exam: Physical Exam  Constitutional: He is oriented to person, place, and time.  HENT:  Nose: No mucosal edema.  Mouth/Throat: No oropharyngeal exudate or posterior oropharyngeal edema.  Eyes: Pupils are equal, round, and reactive to light. Conjunctivae, EOM and lids are normal.  Neck: No JVD present. Carotid bruit is not present. No edema present. No thyroid mass and no thyromegaly present.  Cardiovascular: S1 normal and S2 normal. Exam reveals no gallop.  No murmur heard. Pulses:      Dorsalis pedis pulses are 2+ on the right side and 2+ on the left side.  Respiratory: No respiratory distress. He has decreased breath sounds in the right lower field and the left lower  field. He has no wheezes. He has no rhonchi. He has no rales.  GI: Soft. Bowel sounds are normal. There is no abdominal tenderness.  Musculoskeletal:     Right ankle: He exhibits swelling.     Left ankle: He exhibits swelling.  Lymphadenopathy:    He has no cervical adenopathy.  Neurological: He is alert and oriented to person, place, and time. No cranial nerve deficit.  Skin: Skin is warm. No rash noted. Nails show no clubbing.  Psychiatric: He has a normal mood and affect.      Data Reviewed: Basic Metabolic Panel: Recent Labs  Lab 10/28/18 0500 10/29/18 0547 10/31/18 1244  NA 135 136  --   K 4.0 4.2  --   CL 104 102  --   CO2 24 26  --   GLUCOSE 130* 109*  --   BUN 30* 31*  --   CREATININE 2.36* 2.58* 2.26*  CALCIUM 8.1* 8.4*  --    CBC: Recent Labs  Lab 10/28/18 0500 10/29/18 0547 10/31/18 1244  WBC 15.7* 11.4* 8.6  NEUTROABS  --  7.7  --   HGB 7.8* 8.7* 9.8*  HCT 24.6* 27.3* 30.9*  MCV 95.3 95.8 96.6  PLT 300 350 315   BNP (last 3 results) Recent Labs    07/06/18 1613  BNP 132.0*     CBG: Recent Labs  Lab 11/02/18 1152 11/02/18 1701 11/02/18 2129 11/03/18 0801 11/03/18 1204  GLUCAP 137* 120* 179* 134*  190*    Recent Results (from the past 240 hour(s))  Urine culture     Status: Abnormal   Collection Time: 10/27/18  3:50 PM   Specimen: Urine, Random  Result Value Ref Range Status   Specimen Description   Final    URINE, RANDOM Performed at [facility], [address], [place], [place] [address]    Special Requests   Final    NONE Performed at [facility], [place] [address], [place], [place] [address]    Culture >=100,000 COLONIES/mL ENTEROBACTER CLOACAE (A)  Final   Report Status 10/29/2018 FINAL  Final   Organism ID, Bacteria ENTEROBACTER CLOACAE (A)  Final      Susceptibility   Enterobacter cloacae - MIC*    CEFAZOLIN RESISTANT Resistant     CEFTRIAXONE <=1 SENSITIVE Sensitive     CIPROFLOXACIN 1 SENSITIVE  Sensitive     GENTAMICIN <=1 SENSITIVE Sensitive     IMIPENEM <=0.25 SENSITIVE Sensitive     NITROFURANTOIN 32 SENSITIVE Sensitive     TRIMETH/SULFA <=20 SENSITIVE Sensitive     PIP/TAZO <=4 SENSITIVE Sensitive     * >=100,000 COLONIES/mL ENTEROBACTER CLOACAE  SARS CORONAVIRUS 2 (TAT 6-24 HRS) Nasopharyngeal Nasopharyngeal Swab     Status: None   Collection Time: 10/27/18  8:27 PM   Specimen: Nasopharyngeal Swab  Result Value Ref Range Status   SARS Coronavirus 2 NEGATIVE NEGATIVE Final    Comment: (NOTE) SARS-CoV-2 target nucleic acids are NOT DETECTED. The SARS-CoV-2 RNA is generally detectable in upper and lower respiratory specimens during the acute phase of infection. Negative results do not preclude SARS-CoV-2 infection, do not rule out co-infections with other pathogens, and should not be used as the sole basis for treatment or other patient management decisions. Negative results must be combined with clinical observations, patient history, and epidemiological information. The expected result is Negative. Fact Sheet for Patients: SugarRoll.be Fact Sheet for Healthcare Providers: https://www.woods-mathews.com/ This test is not yet approved or cleared by the [place] FDA and  has been authorized for detection and/or diagnosis of SARS-CoV-2 by FDA under an Emergency Use Authorization (EUA). This EUA will remain  in effect (meaning this test can be used) for the duration of the COVID-19 declaration under Section 56 4(b)(1) of the Act, 21 U.S.C. section 360bbb-3(b)(1), unless the authorization is terminated or revoked sooner. Performed at [facility], [place] [address], [place], [place] [address]      Scheduled Meds: . amLODipine  5 mg Oral Daily  . docusate sodium  100 mg Oral BID  . enoxaparin (LOVENOX) injection  30 mg Subcutaneous Q24H  . insulin aspart  0-15 Units Subcutaneous TID WC  . insulin aspart  5 Units  Subcutaneous TID WC  . insulin detemir  12 Units Subcutaneous Daily  . ipratropium-albuterol  3 mL Nebulization Q6H  . iron polysaccharides  150 mg Oral Daily  . mometasone-formoterol  2 puff Inhalation BID  . pantoprazole  40 mg Oral BID  . pravastatin  10 mg Oral QHS  . tamsulosin  0.4 mg Oral Daily  . torsemide  20 mg Oral QODAY  . vitamin C  500 mg Oral Daily   Continuous Infusions:  Assessment/Plan:  1. Sepsis present on admission with urinary tract infection with Enterobacter cloacae.  Finished treatment with Rocephin and Cipro. 2. L2 compression fracture.  Status post kyphoplasty 10/31/2018.  No back pain currently. 3. Weakness.  Physical therapy recommending rehab.  Insurance authorization still pending.  Repeat Covid test today so we have results for tomorrow. 4. 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 Chronic respiratory failure cough we will put on standing dose nebulizers and inhalers 6. BPH on Flomax  Code Status:     Code Status Orders  (From admission, onward)         Start     Ordered   10/27/18 2342  Do not attempt resuscitation (DNR)  Continuous    Question Answer Comment  In the event of cardiac or respiratory ARREST Do not call a "code blue"   In the event of cardiac or respiratory ARREST Do not perform Intubation, CPR, defibrillation or ACLS   In the event of cardiac or respiratory ARREST Use medication by any route, position, wound care, and other measures to relive pain and suffering. May use oxygen, suction and manual treatment of airway obstruction as needed for comfort.      10/27/18 2342        Code Status History    Date Active Date Inactive Code Status Order ID Comments User Context   10/27/2018 2342 10/27/2018 2342 Full Code [id number]  Christel Mormon, MD ED   08/07/2018 2329 08/09/2018 1634 DNR [id number]  Lance Coon, MD Inpatient   07/25/2018 0955 07/26/2018 1908 DNR [id number]  Bettey Costa, MD Inpatient   07/25/2018 0138 07/25/2018 0954 DNR [id number]  Lance Coon, MD Inpatient   07/06/2018 2054 07/10/2018 1959 DNR [id number]  Henreitta Leber, MD Inpatient   06/27/2018 1150 07/05/2018 1757 DNR [id number]  Lang Snow, NP Inpatient   06/09/2018 1350 06/14/2018 1730 DNR [id number]  Lang Snow, NP ED   05/23/2018 0919 05/28/2018 2024 DNR [id number]  Demetrios Loll, MD Inpatient   05/23/2018 0104 05/23/2018 0918 Full Code [id number]  Mayer Camel, NP Inpatient   Advance Care Planning Activity    Advance Directive Documentation     Most Recent Value  Type of Advance Directive  Living will  Pre-existing out of facility DNR order (yellow form or pink MOST form)  -  "MOST" Form in Place?  -     Family Communication: Spoke with daughter on the phone Disposition Plan: To be determined  Time spent: 28 minutes  [place]

## 2018-11-03 NOTE — Progress Notes (Signed)
Notified Dr. Leslye Peer that BP was 131/48. Was told to hold Amlodipine.

## 2018-11-04 DIAGNOSIS — S32020A Wedge compression fracture of second lumbar vertebra, initial encounter for closed fracture: Secondary | ICD-10-CM

## 2018-11-04 DIAGNOSIS — Z862 Personal history of diseases of the blood and blood-forming organs and certain disorders involving the immune mechanism: Secondary | ICD-10-CM

## 2018-11-04 DIAGNOSIS — A419 Sepsis, unspecified organism: Principal | ICD-10-CM

## 2018-11-04 DIAGNOSIS — N189 Chronic kidney disease, unspecified: Secondary | ICD-10-CM

## 2018-11-04 DIAGNOSIS — E1122 Type 2 diabetes mellitus with diabetic chronic kidney disease: Secondary | ICD-10-CM

## 2018-11-04 DIAGNOSIS — N183 Chronic kidney disease, stage 3 unspecified: Secondary | ICD-10-CM

## 2018-11-04 DIAGNOSIS — N400 Enlarged prostate without lower urinary tract symptoms: Secondary | ICD-10-CM

## 2018-11-04 DIAGNOSIS — S32020D Wedge compression fracture of second lumbar vertebra, subsequent encounter for fracture with routine healing: Secondary | ICD-10-CM

## 2018-11-04 DIAGNOSIS — J9611 Chronic respiratory failure with hypoxia: Secondary | ICD-10-CM

## 2018-11-04 DIAGNOSIS — N390 Urinary tract infection, site not specified: Secondary | ICD-10-CM

## 2018-11-04 LAB — BASIC METABOLIC PANEL
Anion gap: 9 (ref 5–15)
BUN: 47 mg/dL — ABNORMAL HIGH (ref 8–23)
CO2: 28 mmol/L (ref 22–32)
Calcium: 8 mg/dL — ABNORMAL LOW (ref 8.9–10.3)
Chloride: 97 mmol/L — ABNORMAL LOW (ref 98–111)
Creatinine, Ser: 2.28 mg/dL — ABNORMAL HIGH (ref 0.61–1.24)
GFR calc Af Amer: 30 mL/min — ABNORMAL LOW (ref >60)
GFR calc non Af Amer: 26 mL/min — ABNORMAL LOW (ref >60)
Glucose, Bld: 107 mg/dL — ABNORMAL HIGH (ref 70–99)
Potassium: 3.7 mmol/L (ref 3.5–5.1)
Sodium: 134 mmol/L — ABNORMAL LOW (ref 135–145)

## 2018-11-04 LAB — CBC
HCT: 25.3 % — ABNORMAL LOW (ref 39.0–52.0)
Hemoglobin: 8.2 g/dL — ABNORMAL LOW (ref 13.0–17.0)
MCH: 30 pg (ref 26.0–34.0)
MCHC: 32.4 g/dL (ref 30.0–36.0)
MCV: 92.7 fL (ref 80.0–100.0)
Platelets: 211 10*3/uL (ref 150–400)
RBC: 2.73 MIL/uL — ABNORMAL LOW (ref 4.22–5.81)
RDW: 19.1 % — ABNORMAL HIGH (ref 11.5–15.5)
WBC: 6.8 10*3/uL (ref 4.0–10.5)
nRBC: 0.4 % — ABNORMAL HIGH (ref 0.0–0.2)

## 2018-11-04 LAB — GLUCOSE, CAPILLARY
Glucose-Capillary: 123 mg/dL — ABNORMAL HIGH (ref 70–99)
Glucose-Capillary: 143 mg/dL — ABNORMAL HIGH (ref 70–99)
Glucose-Capillary: 187 mg/dL — ABNORMAL HIGH (ref 70–99)
Glucose-Capillary: 98 mg/dL (ref 70–99)

## 2018-11-04 LAB — SARS CORONAVIRUS 2 (TAT 6-24 HRS): SARS Coronavirus 2: NEGATIVE

## 2018-11-04 LAB — SURGICAL PATHOLOGY

## 2018-11-04 MED ORDER — IPRATROPIUM-ALBUTEROL 0.5-2.5 (3) MG/3ML IN SOLN
3.0000 mL | Freq: Three times a day (TID) | RESPIRATORY_TRACT | Status: DC
  Administered 2018-11-04 – 2018-11-05 (×2): 3 mL via RESPIRATORY_TRACT
  Filled 2018-11-04 (×3): qty 3

## 2018-11-04 NOTE — Progress Notes (Signed)
Patient ID: Arthur [name], male   DOB: December 19, 1933, 85 y.o.   MRN: [id number] [facility] PROGRESS NOTE  Arthur [name] [name] DOB: [birth year] DOA: 10/27/2018 PCP: Baxter Hire, MD  HPI/Subjective: Patient feels okay.  Has a chronic cough.  States he wears oxygen as needed and at night.  Objective: Vitals:   11/04/18 1148 11/04/18 1207  BP: (!) 128/46   Pulse: 70   Resp: 17   Temp: 97.6 F (36.4 C)   SpO2: 93% 96%    Filed Weights   10/27/18 1344 10/27/18 1357 10/28/18 0157  Weight: 102.5 kg 102.5 kg 78.4 kg    ROS: Review of Systems  Constitutional: Positive for malaise/fatigue. Negative for chills and fever.  Eyes: Negative for blurred vision.  Respiratory: Positive for cough. Negative for shortness of breath.   Cardiovascular: Negative for chest pain.  Gastrointestinal: Negative for abdominal pain, constipation, diarrhea, nausea and vomiting.  Genitourinary: Negative for dysuria.  Musculoskeletal: Negative for joint pain.  Neurological: Negative for dizziness and headaches.   Exam: Physical Exam  Constitutional: He is oriented to person, place, and time.  HENT:  Nose: No mucosal edema.  Mouth/Throat: No oropharyngeal exudate or posterior oropharyngeal edema.  Eyes: Pupils are equal, round, and reactive to light. Conjunctivae, EOM and lids are normal.  Neck: No JVD present. Carotid bruit is not present. No edema present. No thyroid mass and no thyromegaly present.  Cardiovascular: S1 normal and S2 normal. Exam reveals no gallop.  No murmur heard. Pulses:      Dorsalis pedis pulses are 2+ on the right side and 2+ on the left side.  Respiratory: No respiratory distress. He has decreased breath sounds in the right lower field and the left lower field. He has no wheezes. He has no rhonchi. He has no rales.  GI: Soft. Bowel sounds are normal. There is no abdominal tenderness.  Musculoskeletal:     Right ankle: He exhibits swelling.     Left ankle: He  exhibits swelling.  Lymphadenopathy:    He has no cervical adenopathy.  Neurological: He is alert and oriented to person, place, and time. No cranial nerve deficit.  Skin: Skin is warm. No rash noted. Nails show no clubbing.  Psychiatric: He has a normal mood and affect.      Data Reviewed: Basic Metabolic Panel: Recent Labs  Lab 10/29/18 0547 10/31/18 1244 11/04/18 0547  NA 136  --  134*  K 4.2  --  3.7  CL 102  --  97*  CO2 26  --  28  GLUCOSE 109*  --  107*  BUN 31*  --  47*  CREATININE 2.58* 2.26* 2.28*  CALCIUM 8.4*  --  8.0*   CBC: Recent Labs  Lab 10/29/18 0547 10/31/18 1244 11/04/18 0547  WBC 11.4* 8.6 6.8  NEUTROABS 7.7  --   --   HGB 8.7* 9.8* 8.2*  HCT 27.3* 30.9* 25.3*  MCV 95.8 96.6 92.7  PLT 350 315 211   BNP (last 3 results) Recent Labs    07/06/18 1613  BNP 132.0*     CBG: Recent Labs  Lab 11/03/18 1204 11/03/18 1714 11/03/18 2142 11/04/18 0738 11/04/18 1148  GLUCAP 190* 78 107* 123* 143*    Recent Results (from the past 240 hour(s))  Urine culture     Status: Abnormal   Collection Time: 10/27/18  3:50 PM   Specimen: Urine, Random  Result Value Ref Range Status   Specimen Description   Final  URINE, RANDOM Performed at [facility], [address], [place], [place] [address]    Special Requests   Final    NONE Performed at [facility], [place] [address] [place], [place] [address]    Culture >=100,000 COLONIES/mL ENTEROBACTER CLOACAE (A)  Final   Report Status 10/29/2018 FINAL  Final   Organism ID, Bacteria ENTEROBACTER CLOACAE (A)  Final      Susceptibility   Enterobacter cloacae - MIC*    CEFAZOLIN RESISTANT Resistant     CEFTRIAXONE <=1 SENSITIVE Sensitive     CIPROFLOXACIN 1 SENSITIVE Sensitive     GENTAMICIN <=1 SENSITIVE Sensitive     IMIPENEM <=0.25 SENSITIVE Sensitive     NITROFURANTOIN 32 SENSITIVE Sensitive     TRIMETH/SULFA <=20 SENSITIVE Sensitive     PIP/TAZO <=4 SENSITIVE Sensitive     *  >=100,000 COLONIES/mL ENTEROBACTER CLOACAE  SARS CORONAVIRUS 2 (TAT 6-24 HRS) Nasopharyngeal Nasopharyngeal Swab     Status: None   Collection Time: 10/27/18  8:27 PM   Specimen: Nasopharyngeal Swab  Result Value Ref Range Status   SARS Coronavirus 2 NEGATIVE NEGATIVE Final    Comment: (NOTE) SARS-CoV-2 target nucleic acids are NOT DETECTED. The SARS-CoV-2 RNA is generally detectable in upper and lower respiratory specimens during the acute phase of infection. Negative results do not preclude SARS-CoV-2 infection, do not rule out co-infections with other pathogens, and should not be used as the sole basis for treatment or other patient management decisions. Negative results must be combined with clinical observations, patient history, and epidemiological information. The expected result is Negative. Fact Sheet for Patients: SugarRoll.be Fact Sheet for Healthcare Providers: https://www.woods-mathews.com/ This test is not yet approved or cleared by the [place] FDA and  has been authorized for detection and/or diagnosis of SARS-CoV-2 by FDA under an Emergency Use Authorization (EUA). This EUA will remain  in effect (meaning this test can be used) for the duration of the COVID-19 declaration under Section 56 4(b)(1) of the Act, 21 U.S.C. section 360bbb-3(b)(1), unless the authorization is terminated or revoked sooner. Performed at [facility], [place] [address] [place], [place] [address]   SARS CORONAVIRUS 2 (TAT 6-24 HRS) Nasopharyngeal Nasopharyngeal Swab     Status: None   Collection Time: 11/03/18  8:07 PM   Specimen: Nasopharyngeal Swab  Result Value Ref Range Status   SARS Coronavirus 2 NEGATIVE NEGATIVE Final    Comment: (NOTE) SARS-CoV-2 target nucleic acids are NOT DETECTED. The SARS-CoV-2 RNA is generally detectable in upper and lower respiratory specimens during the acute phase of infection. Negative results do not  preclude SARS-CoV-2 infection, do not rule out co-infections with other pathogens, and should not be used as the sole basis for treatment or other patient management decisions. Negative results must be combined with clinical observations, patient history, and epidemiological information. The expected result is Negative. Fact Sheet for Patients: SugarRoll.be Fact Sheet for Healthcare Providers: https://www.woods-mathews.com/ This test is not yet approved or cleared by the [place] FDA and  has been authorized for detection and/or diagnosis of SARS-CoV-2 by FDA under an Emergency Use Authorization (EUA). This EUA will remain  in effect (meaning this test can be used) for the duration of the COVID-19 declaration under Section 56 4(b)(1) of the Act, 21 U.S.C. section 360bbb-3(b)(1), unless the authorization is terminated or revoked sooner. Performed at [facility], [place] [address], [place], [place] [address]      Scheduled Meds: . amLODipine  5 mg  Oral Daily  . docusate sodium  100 mg Oral BID  . enoxaparin (LOVENOX) injection  30 mg Subcutaneous Q24H  . insulin aspart  0-15 Units Subcutaneous TID WC  . insulin aspart  5 Units Subcutaneous TID WC  . insulin detemir  12 Units Subcutaneous Daily  . ipratropium-albuterol  3 mL Nebulization Q6H  . iron polysaccharides  150 mg Oral Daily  . mometasone-formoterol  2 puff Inhalation BID  . pantoprazole  40 mg Oral BID  . pravastatin  10 mg Oral QHS  . tamsulosin  0.4 mg Oral Daily  . torsemide  20 mg Oral QODAY  . vitamin C  500 mg Oral Daily   Continuous Infusions:  Assessment/Plan:  1. Sepsis present on admission with urinary tract infection with Enterobacter cloacae.  Finished treatment with Rocephin and Cipro. 2. L2 compression fracture.  Status post kyphoplasty 10/31/2018.  No back pain currently. 3. Weakness.  Insurance authorization still pending for rehab.  Repeat Covid test  negative. 4. 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 Chronic respiratory failure cough we will put on standing dose nebulizers and inhalers 6. BPH on Flomax 7. Anemia of chronic disease.  Hemoglobin variable during hospital course.  Code Status:     Code Status Orders  (From admission, onward)         Start     Ordered   10/27/18 2342  Do not attempt resuscitation (DNR)  Continuous    Question Answer Comment  In the event of cardiac or respiratory ARREST Do not call a "code blue"   In the event of cardiac or respiratory ARREST Do not perform Intubation, CPR, defibrillation or ACLS   In the event of cardiac or respiratory ARREST Use medication by any route, position, wound care, and other measures to relive pain and suffering. May use oxygen, suction and manual treatment of airway obstruction as needed for comfort.      10/27/18 2342        Code Status History    Date Active Date Inactive Code Status Order ID Comments User Context   10/27/2018 2342 10/27/2018 2342 Full Code [id number]  Christel Mormon, MD ED   08/07/2018 2329 08/09/2018 1634 DNR [id number]  Lance Coon, MD Inpatient   07/25/2018 0955 07/26/2018 1908 DNR [id number]  Bettey Costa, MD Inpatient   07/25/2018 0138 07/25/2018 0954 DNR [id number]  Lance Coon, MD Inpatient   07/06/2018 2054 07/10/2018 1959 DNR [id number]  Henreitta Leber, MD Inpatient   06/27/2018 1150 07/05/2018 1757 DNR [id number]  Lang Snow, NP Inpatient   06/09/2018 1350 06/14/2018 1730 DNR [id number]  Lang Snow, NP ED   05/23/2018 0919 05/28/2018 2024 DNR [id number]  Demetrios Loll, MD Inpatient   05/23/2018 0104 05/23/2018 0918 Full Code [id number]  Mayer Camel, NP Inpatient   Advance Care Planning Activity    Advance Directive Documentation     Most Recent Value  Type of Advance Directive  Living will  Pre-existing out of facility DNR order (yellow form or pink MOST form)  -  "MOST"  Form in Place?  -     Family Communication: Spoke with daughter on the phone Disposition Plan: Awaiting insurance authorization to go out to rehab.  Time spent: 27 minutes  [place]

## 2018-11-04 NOTE — Progress Notes (Signed)
Patient is sitting up comfortably and looks much better today.  He reports just a small amount of back pain.  I want to have him follow-up in 2 weeks order placed.

## 2018-11-04 NOTE — Progress Notes (Signed)
Physical Therapy Treatment Patient Details Name: RAYYAN BURLEY MRN: [id number] DOB: 08-04-1933 Today's Date: 11/04/2018    History of Present Illness Per MD:  Pt  is an 85 y.o. male with a known history of CHF, stage III-IV chronic kidney disease, diabetes mellitus, COPD and hypertension, who presented to the emergency room with acute onset of back pain with urinary urgency and foul-smelling urine. CT scan revealed L2 compression fracture and signs of cystitis and mesenteritis as well as right pleural effusion with atelectasis as mentioned below. S/P L2 kyphoplasty 10/29.    PT Comments    Pt able to progress walking distance today with RW, 1 rest break and reporting increased LBP.  Pt's O2 sats on 2L improved with ambulation to 96%.  He remained in the low 90's range with seated there ex on room air.  Pt still requires multiple cues for use of RW for safe ambulation and education concerning fall risk.  PT introduced pt to seated Tra strengthening exercises and educated regarding benefit related to back pain.  Pt able to follow directions but required several cues for encouragement.   He reporting pain in crease with minimal pelvic tilt and PT cued pt to modify with simple Tra contraction instead.  Pt hesitant with movement but able to complete all there ex with encouragement.  Pt will continue to benefit from skilled PT with focus on strength, safe mobility, gait mechanics, posture and pain management.  Due to fall risk and decreased caregiver support, pt is still appropriate for discharge to SNF.  Follow Up Recommendations  SNF     Equipment Recommendations  None recommended by PT    Recommendations for Other Services       Precautions / Restrictions Precautions Precautions: Back Precaution Comments: High fall risk Restrictions Weight Bearing Restrictions: No Other Position/Activity Restrictions: Advise pt to slowly lower into sitting to minimize impact on L-spine.    Mobility  Bed  Mobility Overal bed mobility: (In chair)                Transfers Overall transfer level: Needs assistance Equipment used: Rolling walker (2 wheeled) Transfers: Sit to/from Stand Sit to Stand: Min guard         General transfer comment: Multiple attempts to stand but able to rise without PT assist.  Heavy use of RW and VC's for body mechanics.  Ambulation/Gait Ambulation/Gait assistance: Min guard Gait Distance (Feet): 200 Feet Assistive device: Rolling walker (2 wheeled)     Gait velocity interpretation: 1.31 - 2.62 ft/sec, indicative of limited community ambulator General Gait Details: Fair foot clearance and step length, better use of RW but still requires VC's for placement.  Pt took one standing rest break reporting LBP increase  O2 remained WNL.   Stairs             Wheelchair Mobility    Modified Rankin (Stroke Patients Only)       Balance Overall balance assessment: Needs assistance Sitting-balance support: Feet supported Sitting balance-Leahy Scale: Good     Standing balance support: Bilateral upper extremity supported Standing balance-Leahy Scale: Fair Standing balance comment: Very kyphotic with flexed posture and heavy reliance on RW.                            Cognition Arousal/Alertness: Awake/alert Behavior During Therapy: WFL for tasks assessed/performed;Restless Overall Cognitive Status: Within Functional Limits for tasks assessed  General Comments: Follows VC's, though sometimes hesitant.      Exercises Other Exercises Other Exercises: Monitor vitals x4 min Other Exercises: Ther ex: Tra contractions 10x 3 sec, pelvic tilt 10x3 sec, seated LAQ x10 bilat, seated leanback x10 with report of pain increase.    General Comments        Pertinent Vitals/Pain Faces Pain Scale: Hurts a little bit Pain Location: Low back Pain Descriptors / Indicators: Aching Pain  Intervention(s): Monitored during session    Home Living                      Prior Function            PT Goals (current goals can now be found in the care plan section) Acute Rehab PT Goals Patient Stated Goal: Get back to walking PT Goal Formulation: With patient/family Time For Goal Achievement: 11/13/18 Potential to Achieve Goals: Good    Frequency    7X/week      PT Plan Current plan remains appropriate    Co-evaluation              AM-PAC PT "6 Clicks" Mobility   Outcome Measure  Help needed turning from your back to your side while in a flat bed without using bedrails?: A Little Help needed moving from lying on your back to sitting on the side of a flat bed without using bedrails?: A Little Help needed moving to and from a bed to a chair (including a wheelchair)?: A Little Help needed standing up from a chair using your arms (e.g., wheelchair or bedside chair)?: A Little Help needed to walk in hospital room?: A Little Help needed climbing 3-5 steps with a railing? : A Lot 6 Click Score: 17    End of Session Equipment Utilized During Treatment: Gait belt Activity Tolerance: Patient limited by fatigue;Patient limited by pain Patient left: in chair;in bed;with bed alarm set Nurse Communication: Mobility status PT Visit Diagnosis: Unsteadiness on feet (R26.81);Muscle weakness (generalized) (M62.81);Difficulty in walking, not elsewhere classified (R26.2)     Time: 0937-1000 PT Time Calculation (min) (ACUTE ONLY): 23 min  Charges:  $Therapeutic Exercise: 23-37 mins                     Roxanne Gates, PT, DPT    Roxanne Gates 11/04/2018, 1:05 PM

## 2018-11-04 NOTE — TOC Progression Note (Addendum)
Transition of Care [facility]) - Progression Note    Patient Details  Name: Arthur [name] MRN: [id number] Date of Birth: 1933-02-16  [facility] CM/SW Contact  Beverly Sessions, RN Phone Number: 11/04/2018, 3:46 PM  Clinical Narrative:    Message left with Magda Paganini with [place] to check on status of auth  Per leslie updated clinical sent at 2pm  Expected Discharge Plan: [place] Barriers to Discharge: Continued Medical Work up  Expected Discharge Plan and Services Expected Discharge Plan: [place]   Discharge Planning Services: CM Consult   Living arrangements for the past 2 months: Single Family Home                                       Social Determinants of Health (SDOH) Interventions    Readmission Risk Interventions Readmission Risk Prevention Plan 10/29/2018 08/09/2018 07/26/2018  Transportation Screening Complete Complete Complete  PCP or Specialist Appt within 5-7 Days - - -  PCP or Specialist Appt within 3-5 Days - - Complete  Home Care Screening - - -  Medication Review (RN CM) - - -  [place] or [place] - - Complete  Social Work Consult for [place] Planning/Counseling - - Complete  Palliative Care Screening - - Not Applicable  Medication Review [employer]) Complete Complete Complete  PCP or Specialist appointment within 3-5 days of discharge - Complete -  [place] or [place] - Complete -  SW Recovery Care/Counseling Consult - Complete -  [place] - Not Applicable -  Some recent data might be hidden

## 2018-11-04 NOTE — Consult Note (Signed)
   [facility] CM Inpatient Consult   11/04/2018  Arthur [name] 1933-02-15 [id number]   Patient chart reviewed for potential [facility] CM services due to extreme (47%) unplanned readmission risk score and multiple hospitalizations within past 6 months.  Per chart review, current disposition plan is for SNF. No [facility] Care Management needs at this time.  Netta Cedars, MSN, [facility] Liaison Nurse Mobile Phone [phone]  Toll free office [phone]

## 2018-11-04 NOTE — Care Management Important Message (Signed)
Important Message  Patient Details  Name: Arthur [name] MRN: [id number] Date of Birth: 1933/11/13   Medicare Important Message Given:  Yes     Dannette Barbara 11/04/2018, 11:28 AM

## 2018-11-05 DIAGNOSIS — N390 Urinary tract infection, site not specified: Secondary | ICD-10-CM | POA: Diagnosis not present

## 2018-11-05 DIAGNOSIS — R0989 Other specified symptoms and signs involving the circulatory and respiratory systems: Secondary | ICD-10-CM | POA: Diagnosis not present

## 2018-11-05 DIAGNOSIS — N184 Chronic kidney disease, stage 4 (severe): Secondary | ICD-10-CM | POA: Diagnosis not present

## 2018-11-05 DIAGNOSIS — A419 Sepsis, unspecified organism: Secondary | ICD-10-CM | POA: Diagnosis not present

## 2018-11-05 DIAGNOSIS — Z79899 Other long term (current) drug therapy: Secondary | ICD-10-CM | POA: Diagnosis not present

## 2018-11-05 DIAGNOSIS — I129 Hypertensive chronic kidney disease with stage 1 through stage 4 chronic kidney disease, or unspecified chronic kidney disease: Secondary | ICD-10-CM | POA: Diagnosis not present

## 2018-11-05 DIAGNOSIS — J9611 Chronic respiratory failure with hypoxia: Secondary | ICD-10-CM | POA: Diagnosis not present

## 2018-11-05 DIAGNOSIS — R109 Unspecified abdominal pain: Secondary | ICD-10-CM | POA: Diagnosis not present

## 2018-11-05 DIAGNOSIS — E169 Disorder of pancreatic internal secretion, unspecified: Secondary | ICD-10-CM | POA: Diagnosis not present

## 2018-11-05 DIAGNOSIS — D631 Anemia in chronic kidney disease: Secondary | ICD-10-CM | POA: Diagnosis not present

## 2018-11-05 DIAGNOSIS — E039 Hypothyroidism, unspecified: Secondary | ICD-10-CM | POA: Diagnosis not present

## 2018-11-05 DIAGNOSIS — I959 Hypotension, unspecified: Secondary | ICD-10-CM | POA: Diagnosis not present

## 2018-11-05 DIAGNOSIS — N400 Enlarged prostate without lower urinary tract symptoms: Secondary | ICD-10-CM | POA: Diagnosis not present

## 2018-11-05 DIAGNOSIS — I10 Essential (primary) hypertension: Secondary | ICD-10-CM | POA: Diagnosis not present

## 2018-11-05 DIAGNOSIS — I130 Hypertensive heart and chronic kidney disease with heart failure and stage 1 through stage 4 chronic kidney disease, or unspecified chronic kidney disease: Secondary | ICD-10-CM | POA: Diagnosis not present

## 2018-11-05 DIAGNOSIS — J449 Chronic obstructive pulmonary disease, unspecified: Secondary | ICD-10-CM | POA: Diagnosis not present

## 2018-11-05 DIAGNOSIS — M2550 Pain in unspecified joint: Secondary | ICD-10-CM | POA: Diagnosis not present

## 2018-11-05 DIAGNOSIS — J439 Emphysema, unspecified: Secondary | ICD-10-CM | POA: Diagnosis not present

## 2018-11-05 DIAGNOSIS — Z9889 Other specified postprocedural states: Secondary | ICD-10-CM | POA: Diagnosis not present

## 2018-11-05 DIAGNOSIS — D649 Anemia, unspecified: Secondary | ICD-10-CM | POA: Diagnosis not present

## 2018-11-05 DIAGNOSIS — E119 Type 2 diabetes mellitus without complications: Secondary | ICD-10-CM | POA: Diagnosis not present

## 2018-11-05 DIAGNOSIS — E1122 Type 2 diabetes mellitus with diabetic chronic kidney disease: Secondary | ICD-10-CM | POA: Diagnosis not present

## 2018-11-05 DIAGNOSIS — N189 Chronic kidney disease, unspecified: Secondary | ICD-10-CM | POA: Diagnosis not present

## 2018-11-05 DIAGNOSIS — R531 Weakness: Secondary | ICD-10-CM | POA: Diagnosis not present

## 2018-11-05 DIAGNOSIS — I509 Heart failure, unspecified: Secondary | ICD-10-CM | POA: Diagnosis not present

## 2018-11-05 DIAGNOSIS — E0590 Thyrotoxicosis, unspecified without thyrotoxic crisis or storm: Secondary | ICD-10-CM | POA: Diagnosis not present

## 2018-11-05 DIAGNOSIS — J90 Pleural effusion, not elsewhere classified: Secondary | ICD-10-CM | POA: Diagnosis not present

## 2018-11-05 DIAGNOSIS — Z7401 Bed confinement status: Secondary | ICD-10-CM | POA: Diagnosis not present

## 2018-11-05 DIAGNOSIS — N183 Chronic kidney disease, stage 3 unspecified: Secondary | ICD-10-CM | POA: Diagnosis not present

## 2018-11-05 DIAGNOSIS — J431 Panlobular emphysema: Secondary | ICD-10-CM | POA: Diagnosis not present

## 2018-11-05 DIAGNOSIS — E785 Hyperlipidemia, unspecified: Secondary | ICD-10-CM | POA: Diagnosis not present

## 2018-11-05 DIAGNOSIS — E1121 Type 2 diabetes mellitus with diabetic nephropathy: Secondary | ICD-10-CM | POA: Diagnosis not present

## 2018-11-05 DIAGNOSIS — N3000 Acute cystitis without hematuria: Secondary | ICD-10-CM | POA: Diagnosis not present

## 2018-11-05 DIAGNOSIS — S32020D Wedge compression fracture of second lumbar vertebra, subsequent encounter for fracture with routine healing: Secondary | ICD-10-CM | POA: Diagnosis not present

## 2018-11-05 DIAGNOSIS — I700 Atherosclerosis of aorta: Secondary | ICD-10-CM | POA: Diagnosis not present

## 2018-11-05 DIAGNOSIS — Z794 Long term (current) use of insulin: Secondary | ICD-10-CM | POA: Diagnosis not present

## 2018-11-05 DIAGNOSIS — K5900 Constipation, unspecified: Secondary | ICD-10-CM | POA: Diagnosis not present

## 2018-11-05 LAB — GLUCOSE, CAPILLARY
Glucose-Capillary: 133 mg/dL — ABNORMAL HIGH (ref 70–99)
Glucose-Capillary: 189 mg/dL — ABNORMAL HIGH (ref 70–99)

## 2018-11-05 MED ORDER — DOCUSATE SODIUM 100 MG PO CAPS: 100.0000 mg | ORAL_CAPSULE | Freq: Two times a day (BID) | ORAL | 0 refills | Status: DC

## 2018-11-05 MED ORDER — MOMETASONE FURO-FORMOTEROL FUM 100-5 MCG/ACT IN AERO: 2.0000 | INHALATION_SPRAY | Freq: Two times a day (BID) | RESPIRATORY_TRACT | 0 refills | Status: DC

## 2018-11-05 MED ORDER — ACETAMINOPHEN 325 MG PO TABS: 650.0000 mg | ORAL_TABLET | Freq: Four times a day (QID) | ORAL | Status: DC | PRN

## 2018-11-05 MED ORDER — INSULIN ASPART 100 UNIT/ML ~~LOC~~ SOLN: 5.0000 [IU] | Freq: Three times a day (TID) | SUBCUTANEOUS | 11 refills | Status: DC

## 2018-11-05 MED ORDER — AMLODIPINE BESYLATE 5 MG PO TABS: 5.0000 mg | ORAL_TABLET | Freq: Every day | ORAL | 0 refills | Status: DC

## 2018-11-05 NOTE — Progress Notes (Signed)
Nsg Discharge Note  Admit Date:  10/27/2018 Discharge date: 11/05/2018   Arthur [name] to be D/C'd Skilled nursing facility per MD order.  AVS completed.  Copy for chart, and copy for patient signed, and dated. Patient/caregiver able to verbalize understanding.  Discharge Medication: Allergies as of 11/05/2018      Reactions   Codeine Other (See Comments)   Constipation      Medication List    STOP taking these medications   ipratropium-albuterol 0.5-2.5 (3) MG/3ML Soln Commonly known as: DUONEB   nystatin cream Commonly known as: MYCOSTATIN   potassium chloride 10 MEQ tablet Commonly known as: KLOR-CON     TAKE these medications   acetaminophen 325 MG tablet Commonly known as: TYLENOL Take 2 tablets (650 mg total) by mouth every 6 (six) hours as needed for mild pain (or Fever >/= 101).   albuterol 108 (90 Base) MCG/ACT inhaler Commonly known as: VENTOLIN HFA Inhale 2 puffs into the lungs every 6 (six) hours as needed for wheezing or shortness of breath.   amLODipine 5 MG tablet Commonly known as: NORVASC Take 1 tablet (5 mg total) by mouth daily. Start taking on: November 06, 2018 What changed:   medication strength  how much to take   docusate sodium 100 MG capsule Commonly known as: COLACE Take 1 capsule (100 mg total) by mouth 2 (two) times daily.   insulin aspart 100 UNIT/ML injection Commonly known as: novoLOG Inject 5 Units into the skin 3 (three) times daily with meals. What changed: how much to take   iron polysaccharides 150 MG capsule Commonly known as: NIFEREX Take 1 capsule (150 mg total) by mouth daily.   Levemir 100 UNIT/ML injection Generic drug: insulin detemir Inject 0.12 mLs (12 Units total) into the skin daily. What changed: how much to take   mometasone-formoterol 100-5 MCG/ACT Aero Commonly known as: DULERA Inhale 2 puffs into the lungs 2 (two) times daily.   pantoprazole 40 MG tablet Commonly known as: Protonix Take 1 tablet  (40 mg total) by mouth 2 (two) times daily.   pravastatin 10 MG tablet Commonly known as: PRAVACHOL Take 10 mg by mouth at bedtime.   tamsulosin 0.4 MG Caps capsule Commonly known as: FLOMAX Take 0.4 mg by mouth daily.   torsemide 20 MG tablet Commonly known as: DEMADEX Take 20 mg by mouth every other day.   vitamin C 500 MG tablet Commonly known as: ASCORBIC ACID Take 1 tablet (500 mg total) by mouth daily.       Discharge Assessment: Vitals:   11/05/18 0502 11/05/18 0757  BP: (!) 127/54   Pulse: 66   Resp: 20   Temp: 98.8 F (37.1 C)   SpO2: 90% 94%   Skin clean, dry and intact without evidence of skin break down, no evidence of skin tears noted. IV catheter discontinued intact. Site without signs and symptoms of complications - no redness or edema noted at insertion site, patient denies c/o pain - only slight tenderness at site.  Dressing with slight pressure applied.  D/c Instructions-Education: Discharge instructions given to patient/family with verbalized understanding. D/c education completed with patient/family including follow up instructions, medication list, d/c activities limitations if indicated, with other d/c instructions as indicated by MD - patient able to verbalize understanding, all questions fully answered. Patient instructed to return to ED, call 911, or call MD for any changes in condition.  Patient escorted via [place], and D/C home via private auto.  Eda Keys, RN  11/05/2018 12:41 PM

## 2018-11-05 NOTE — Discharge Summary (Addendum)
[facility] [place] NAME: Arthur [name]    MR#:  [id number]  DATE OF BIRTH:  May 21, 1933  DATE OF ADMISSION:  10/27/2018 ADMITTING PHYSICIAN: Christel Mormon, MD  DATE OF DISCHARGE: 11/05/2018  PRIMARY CARE PHYSICIAN: Baxter Hire, MD    ADMISSION DIAGNOSIS:  Pyelonephritis [N12]  DISCHARGE DIAGNOSIS:  Active Problems:   Sepsis secondary to UTI [facility])   Closed compression fracture of second lumbar vertebra (HCC)   CKD stage 3 due to type 2 diabetes mellitus (HCC)   Chronic respiratory failure with hypoxia (HCC)   Benign prostatic hyperplasia without lower urinary tract symptoms   History of anemia due to chronic kidney disease   CKD stage 4 due to type 2 diabetes mellitus ([place])   SECONDARY DIAGNOSIS:   Past Medical History:  Diagnosis Date  . Anemia of chronic kidney failure 08/21/2018  . CHF (congestive heart failure) ([place])   . CKD (chronic kidney disease), stage III   . COPD (chronic obstructive pulmonary disease) ([place])   . Diabetes mellitus without complication ([place])   . Hyperlipidemia   . Hypertension     HOSPITAL COURSE:   1.  Clinical sepsis present on admission.  Patient had a urinary tract infection with Enterobacter cloacae.  Patient completed the entire course of antibiotics here in the hospital with Rocephin initially and then switched over to oral Cipro. 2.  L2 compression fracture status post kyphoplasty on 10/31/2018.  Just some soreness in the back. 3.  Weakness.  Insurance authorized rehab.  Patient will be discharged out to rehab today.  Repeat Covid test negative. 4.  Type 2 diabetes mellitus with chronic kidney disease stage IV.  On Levemir insulin and short acting insulin prior to meals.  Check fingersticks before every meal and nightly. 5.  Acute respiratory failure with cough.  Continue inhalers.  Patient wears oxygen 2 L at night and as needed during the day if his pulse ox is less than 88%. 6.  BPH on  Flomax 7.  Anemia of chronic kidney disease  Recommend checking a CBC and BMP weekly  DISCHARGE CONDITIONS:   Satisfactory  CONSULTS OBTAINED:  Treatment Team:  [name], MD  DRUG ALLERGIES:   Allergies  Allergen Reactions  . Codeine Other (See Comments)    Constipation    DISCHARGE MEDICATIONS:   Allergies as of 11/05/2018      Reactions   Codeine Other (See Comments)   Constipation      Medication List    STOP taking these medications   ipratropium-albuterol 0.5-2.5 (3) MG/3ML Soln Commonly known as: DUONEB   nystatin cream Commonly known as: MYCOSTATIN   potassium chloride 10 MEQ tablet Commonly known as: KLOR-CON     TAKE these medications   acetaminophen 325 MG tablet Commonly known as: TYLENOL Take 2 tablets (650 mg total) by mouth every 6 (six) hours as needed for mild pain (or Fever >/= 101).   albuterol 108 (90 Base) MCG/ACT inhaler Commonly known as: VENTOLIN HFA Inhale 2 puffs into the lungs every 6 (six) hours as needed for wheezing or shortness of breath.   amLODipine 5 MG tablet Commonly known as: NORVASC Take 1 tablet (5 mg total) by mouth daily. Start taking on: November 06, 2018 What changed:   medication strength  how much to take   docusate sodium 100 MG capsule Commonly known as: COLACE Take 1 capsule (100 mg total) by mouth 2 (two) times daily.   insulin  aspart 100 UNIT/ML injection Commonly known as: novoLOG Inject 5 Units into the skin 3 (three) times daily with meals. What changed: how much to take   iron polysaccharides 150 MG capsule Commonly known as: NIFEREX Take 1 capsule (150 mg total) by mouth daily.   Levemir 100 UNIT/ML injection Generic drug: insulin detemir Inject 0.12 mLs (12 Units total) into the skin daily. What changed: how much to take   mometasone-formoterol 100-5 MCG/ACT Aero Commonly known as: DULERA Inhale 2 puffs into the lungs 2 (two) times daily.   pantoprazole 40 MG tablet Commonly  known as: Protonix Take 1 tablet (40 mg total) by mouth 2 (two) times daily.   pravastatin 10 MG tablet Commonly known as: PRAVACHOL Take 10 mg by mouth at bedtime.   tamsulosin 0.4 MG Caps capsule Commonly known as: FLOMAX Take 0.4 mg by mouth daily.   torsemide 20 MG tablet Commonly known as: DEMADEX Take 20 mg by mouth every other day.   vitamin C 500 MG tablet Commonly known as: ASCORBIC ACID Take 1 tablet (500 mg total) by mouth daily.        DISCHARGE INSTRUCTIONS:   Follow-up with Dr. At rehab in 1 day Follow-up Dr. Rudene Christians orthopedic surgery  If you experience worsening of your admission symptoms, develop shortness of breath, life threatening emergency, suicidal or homicidal thoughts you must seek medical attention immediately by calling 911 or calling your MD immediately  if symptoms less severe.  You Must read complete instructions/literature along with all the possible adverse reactions/side effects for all the Medicines you take and that have been prescribed to you. Take any new Medicines after you have completely understood and accept all the possible adverse reactions/side effects.   Please note  You were cared for by a hospitalist during your hospital stay. If you have any questions about your discharge medications or the care you received while you were in the hospital after you are discharged, you can call the unit and asked to speak with the hospitalist on call if the hospitalist that took care of you is not available. Once you are discharged, your primary care physician will handle any further medical issues. Please note that NO REFILLS for any discharge medications will be authorized once you are discharged, as it is imperative that you return to your primary care physician (or establish a relationship with a primary care physician if you do not have one) for your aftercare needs so that they can reassess your need for medications and monitor your lab  values.    Today   CHIEF COMPLAINT:   Chief Complaint  Patient presents with  . Back Pain    HISTORY OF PRESENT ILLNESS:  Arthur [name]  is a 85 y.o. male came in with back pain   VITAL SIGNS:  Blood pressure (!) 127/54, pulse 66, temperature 98.8 F (37.1 C), temperature source Oral, resp. rate 20, height 5\' 8"  (1.727 m), weight 78.4 kg, SpO2 94 %.   PHYSICAL EXAMINATION:  GENERAL:  85 y.o.-year-old patient lying in the bed with no acute distress.  EYES: Pupils equal, round, reactive to light and accommodation. No scleral icterus. Extraocular muscles intact.  HEENT: Head atraumatic, normocephalic. Oropharynx and nasopharynx clear.  NECK:  Supple, no jugular venous distention. No thyroid enlargement, no tenderness.  LUNGS: Normal breath sounds bilaterally, no wheezing, rales,rhonchi or crepitation. No use of accessory muscles of respiration.  CARDIOVASCULAR: S1, S2 normal. No murmurs, rubs, or gallops.  ABDOMEN: Soft, non-tender, non-distended. Bowel  sounds present. No organomegaly or mass.  EXTREMITIES: Trace pedal edema, no cyanosis, or clubbing.  NEUROLOGIC: Cranial nerves II through XII are intact. Muscle strength 5/5 in all extremities. Sensation intact. Gait not checked.  PSYCHIATRIC: The patient is alert and oriented x 3.  SKIN: No obvious rash, lesion, or ulcer.   DATA REVIEW:   CBC Recent Labs  Lab 11/04/18 0547  WBC 6.8  HGB 8.2*  HCT 25.3*  PLT 211    Chemistries  Recent Labs  Lab 11/04/18 0547  NA 134*  K 3.7  CL 97*  CO2 28  GLUCOSE 107*  BUN 47*  CREATININE 2.28*  CALCIUM 8.0*      Microbiology Results  Results for orders placed or performed during the hospital encounter of 10/27/18  Urine culture     Status: Abnormal   Collection Time: 10/27/18  3:50 PM   Specimen: Urine, Random  Result Value Ref Range Status   Specimen Description   Final    URINE, RANDOM Performed at [facility], [address] [place], [place]  [address]    Special Requests   Final    NONE Performed at [facility], [place] [address], [place], [place] [address]    Culture >=100,000 COLONIES/mL ENTEROBACTER CLOACAE (A)  Final   Report Status 10/29/2018 FINAL  Final   Organism ID, Bacteria ENTEROBACTER CLOACAE (A)  Final      Susceptibility   Enterobacter cloacae - MIC*    CEFAZOLIN RESISTANT Resistant     CEFTRIAXONE <=1 SENSITIVE Sensitive     CIPROFLOXACIN 1 SENSITIVE Sensitive     GENTAMICIN <=1 SENSITIVE Sensitive     IMIPENEM <=0.25 SENSITIVE Sensitive     NITROFURANTOIN 32 SENSITIVE Sensitive     TRIMETH/SULFA <=20 SENSITIVE Sensitive     PIP/TAZO <=4 SENSITIVE Sensitive     * >=100,000 COLONIES/mL ENTEROBACTER CLOACAE  SARS CORONAVIRUS 2 (TAT 6-24 HRS) Nasopharyngeal Nasopharyngeal Swab     Status: None   Collection Time: 10/27/18  8:27 PM   Specimen: Nasopharyngeal Swab  Result Value Ref Range Status   SARS Coronavirus 2 NEGATIVE NEGATIVE Final    Comment: (NOTE) SARS-CoV-2 target nucleic acids are NOT DETECTED. The SARS-CoV-2 RNA is generally detectable in upper and lower respiratory specimens during the acute phase of infection. Negative results do not preclude SARS-CoV-2 infection, do not rule out co-infections with other pathogens, and should not be used as the sole basis for treatment or other patient management decisions. Negative results must be combined with clinical observations, patient history, and epidemiological information. The expected result is Negative. Fact Sheet for Patients: SugarRoll.be Fact Sheet for Healthcare Providers: https://www.woods-mathews.com/ This test is not yet approved or cleared by the [place] FDA and  has been authorized for detection and/or diagnosis of SARS-CoV-2 by FDA under an Emergency Use Authorization (EUA). This EUA will remain  in effect (meaning this test can be used) for the duration of the COVID-19 declaration  under Section 56 4(b)(1) of the Act, 21 U.S.C. section 360bbb-3(b)(1), unless the authorization is terminated or revoked sooner. Performed at [facility], [place] [address] [place], [place] [address]   SARS CORONAVIRUS 2 (TAT 6-24 HRS) Nasopharyngeal Nasopharyngeal Swab     Status: None   Collection Time: 11/03/18  8:07 PM   Specimen: Nasopharyngeal Swab  Result Value Ref Range Status   SARS Coronavirus 2 NEGATIVE NEGATIVE Final    Comment: (NOTE) SARS-CoV-2 target nucleic acids are NOT DETECTED. The SARS-CoV-2 RNA  is generally detectable in upper and lower respiratory specimens during the acute phase of infection. Negative results do not preclude SARS-CoV-2 infection, do not rule out co-infections with other pathogens, and should not be used as the sole basis for treatment or other patient management decisions. Negative results must be combined with clinical observations, patient history, and epidemiological information. The expected result is Negative. Fact Sheet for Patients: SugarRoll.be Fact Sheet for Healthcare Providers: https://www.woods-mathews.com/ This test is not yet approved or cleared by the [place] FDA and  has been authorized for detection and/or diagnosis of SARS-CoV-2 by FDA under an Emergency Use Authorization (EUA). This EUA will remain  in effect (meaning this test can be used) for the duration of the COVID-19 declaration under Section 56 4(b)(1) of the Act, 21 U.S.C. section 360bbb-3(b)(1), unless the authorization is terminated or revoked sooner. Performed at [facility], [place] [address] [place], [place] [address]       Management plans discussed with the patient, family and they are in agreement.  CODE STATUS:     Code Status Orders  (From admission, onward)         Start     Ordered   10/27/18 2342  Do not attempt resuscitation (DNR)  Continuous    Question Answer Comment  In the  event of cardiac or respiratory ARREST Do not call a "code blue"   In the event of cardiac or respiratory ARREST Do not perform Intubation, CPR, defibrillation or ACLS   In the event of cardiac or respiratory ARREST Use medication by any route, position, wound care, and other measures to relive pain and suffering. May use oxygen, suction and manual treatment of airway obstruction as needed for comfort.      10/27/18 2342        Code Status History    Date Active Date Inactive Code Status Order ID Comments User Context   10/27/2018 2342 10/27/2018 2342 Full Code [id number]  Christel Mormon, MD ED   08/07/2018 2329 08/09/2018 1634 DNR [id number]  Lance Coon, MD Inpatient   07/25/2018 0955 07/26/2018 1908 DNR [id number]  Bettey Costa, MD Inpatient   07/25/2018 0138 07/25/2018 0954 DNR [id number]  Lance Coon, MD Inpatient   07/06/2018 2054 07/10/2018 1959 DNR [id number]  Henreitta Leber, MD Inpatient   06/27/2018 1150 07/05/2018 1757 DNR [id number]  Lang Snow, NP Inpatient   06/09/2018 1350 06/14/2018 1730 DNR [id number]  Lang Snow, NP ED   05/23/2018 0919 05/28/2018 2024 DNR [id number]  Demetrios Loll, MD Inpatient   05/23/2018 0104 05/23/2018 0918 Full Code [id number]  Mayer Camel, NP Inpatient   Advance Care Planning Activity    Advance Directive Documentation     Most Recent Value  Type of Advance Directive  Living will  Pre-existing out of facility DNR order (yellow form or pink MOST form)  -  "MOST" Form in Place?  -      TOTAL TIME TAKING CARE OF THIS PATIENT: 35 minutes.    Loletha Grayer M.D on 11/05/2018 at 10:46 AM  Between 7am to 6pm - Pager - [phone]  After 6pm go to [email or web address] - password [email or web address]  [facility]  CC: Primary care physician; Baxter Hire, MD

## 2018-11-05 NOTE — Progress Notes (Signed)
Called [facility] to give repot. Spoke to [place]. Questions asked and answered. EMS called for non emergent transport.

## 2018-11-05 NOTE — TOC Transition Note (Signed)
Transition of Care [facility]) - CM/SW Discharge Note   Patient Details  Name: Arthur [name] MRN: [id number] Date of Birth: Jun 09, 1933  [facility]) CM/SW Contact:  Beverly Sessions, RN Phone Number: 11/05/2018, 4:07 PM   Clinical Narrative:     Late Entry 88 Notified by Magda Paganini at [facility] that Josem Kaufmann has been obtained  Patient to discharge today  MD notified patients daughter  Patient to transport by EMS  EMS packet on chart Beside RN notified   Final next level of care: Skilled Nursing Facility Barriers to Discharge: Barriers Resolved   Patient Goals and CMS Choice Patient states their goals for this hospitalization and ongoing recovery are:: to get better and get back home      Discharge Placement              Patient chooses bed at: [facility] Patient to be transferred to facility by: EMS Name of family member notified: MD notified daughter Patient and family notified of of transfer: 11/05/18  Discharge Plan and Services   Discharge Planning Services: CM Consult                                 Social Determinants of Health ([place]) Interventions     Readmission Risk Interventions Readmission Risk Prevention Plan 11/05/2018 10/29/2018 08/09/2018  Transportation Screening Complete Complete Complete  PCP or Specialist Appt within 5-7 Days - - -  PCP or Specialist Appt within 3-5 Days - - -  Home Care Screening - - -  Medication Review (RN CM) - - -  HRI or [place] Work Consult for [place] Planning/Counseling - - -  [place] - - -  Medication Review Press photographer) Complete Complete Complete  PCP or Specialist appointment within 3-5 days of discharge (No Data) - Complete  HRI or Home Care Consult - - Complete  SW Recovery Care/Counseling Consult - - Complete  Palliative Care Screening Not Applicable - Not Applicable  Skilled Nursing Facility Complete - Not Applicable   Some recent data might be hidden

## 2018-11-05 NOTE — Plan of Care (Signed)
  Problem: Education: Goal: Knowledge of General Education information will improve Description: Including pain rating scale, medication(s)/side effects and non-pharmacologic comfort measures Outcome: Adequate for Discharge   Problem: Health Behavior/Discharge Planning: Goal: Ability to manage health-related needs will improve Outcome: Adequate for Discharge   Problem: Clinical Measurements: Goal: Ability to maintain clinical measurements within normal limits will improve Outcome: Adequate for Discharge Goal: Will remain free from infection Outcome: Adequate for Discharge Goal: Diagnostic test results will improve Outcome: Adequate for Discharge Goal: Respiratory complications will improve Outcome: Adequate for Discharge Goal: Cardiovascular complication will be avoided Outcome: Adequate for Discharge   Problem: Activity: Goal: Risk for activity intolerance will decrease Outcome: Adequate for Discharge   Problem: Nutrition: Goal: Adequate nutrition will be maintained Outcome: Adequate for Discharge   Problem: Coping: Goal: Level of anxiety will decrease Outcome: Adequate for Discharge   Problem: Elimination: Goal: Will not experience complications related to bowel motility Outcome: Adequate for Discharge Goal: Will not experience complications related to urinary retention Outcome: Adequate for Discharge   Problem: Pain Managment: Goal: General experience of comfort will improve Outcome: Adequate for Discharge   Problem: Safety: Goal: Ability to remain free from injury will improve Outcome: Adequate for Discharge   Problem: Skin Integrity: Goal: Risk for impaired skin integrity will decrease Outcome: Adequate for Discharge   Problem: Acute Rehab PT Goals(only PT should resolve) Goal: Pt Will Perform Standing Balance Or Pre-Gait Outcome: Adequate for Discharge Goal: Pt Will Ambulate Outcome: Adequate for Discharge Goal: Pt Will Go Up/Down Stairs Outcome:  Adequate for Discharge   

## 2018-11-06 DIAGNOSIS — E119 Type 2 diabetes mellitus without complications: Secondary | ICD-10-CM | POA: Diagnosis not present

## 2018-11-06 DIAGNOSIS — E1121 Type 2 diabetes mellitus with diabetic nephropathy: Secondary | ICD-10-CM | POA: Diagnosis not present

## 2018-11-06 DIAGNOSIS — Z794 Long term (current) use of insulin: Secondary | ICD-10-CM | POA: Diagnosis not present

## 2018-11-06 DIAGNOSIS — R531 Weakness: Secondary | ICD-10-CM | POA: Diagnosis not present

## 2018-11-06 DIAGNOSIS — D649 Anemia, unspecified: Secondary | ICD-10-CM | POA: Diagnosis not present

## 2018-11-06 DIAGNOSIS — J9611 Chronic respiratory failure with hypoxia: Secondary | ICD-10-CM | POA: Diagnosis not present

## 2018-11-06 DIAGNOSIS — E039 Hypothyroidism, unspecified: Secondary | ICD-10-CM | POA: Diagnosis not present

## 2018-11-06 DIAGNOSIS — Z9889 Other specified postprocedural states: Secondary | ICD-10-CM | POA: Diagnosis not present

## 2018-11-06 DIAGNOSIS — N3000 Acute cystitis without hematuria: Secondary | ICD-10-CM | POA: Diagnosis not present

## 2018-11-06 DIAGNOSIS — E169 Disorder of pancreatic internal secretion, unspecified: Secondary | ICD-10-CM | POA: Diagnosis not present

## 2018-11-06 DIAGNOSIS — E0590 Thyrotoxicosis, unspecified without thyrotoxic crisis or storm: Secondary | ICD-10-CM | POA: Diagnosis not present

## 2018-11-06 DIAGNOSIS — I10 Essential (primary) hypertension: Secondary | ICD-10-CM | POA: Diagnosis not present

## 2018-11-12 ENCOUNTER — Telehealth: Payer: Self-pay | Admitting: Nurse Practitioner

## 2018-11-12 NOTE — Telephone Encounter (Signed)
I called Mr. Pilger daughter, [name] as he has been followed by home palliative care. We talked about his recent hospitalization and now discharge to short-term rehab at [facility]. [name] endorses plan is to discharge home with Home Health. [name] talked briefly about how Mr. Enderle is doing. We talked about option of continuing in-home palliative care and [name] in agreement. Discuss with [name] that palliative care is available at [facility] while in short-term rehab should she wish to request services. Questions answered, therapeutic listening and emotional support, contact information provided.

## 2018-11-15 ENCOUNTER — Other Ambulatory Visit: Payer: Self-pay

## 2018-11-15 DIAGNOSIS — S32020D Wedge compression fracture of second lumbar vertebra, subsequent encounter for fracture with routine healing: Secondary | ICD-10-CM | POA: Diagnosis not present

## 2018-11-18 ENCOUNTER — Inpatient Hospital Stay: Payer: Medicare HMO | Attending: Oncology

## 2018-11-18 DIAGNOSIS — E785 Hyperlipidemia, unspecified: Secondary | ICD-10-CM | POA: Insufficient documentation

## 2018-11-18 DIAGNOSIS — I509 Heart failure, unspecified: Secondary | ICD-10-CM | POA: Insufficient documentation

## 2018-11-18 DIAGNOSIS — J90 Pleural effusion, not elsewhere classified: Secondary | ICD-10-CM | POA: Insufficient documentation

## 2018-11-18 DIAGNOSIS — Z794 Long term (current) use of insulin: Secondary | ICD-10-CM | POA: Insufficient documentation

## 2018-11-18 DIAGNOSIS — E119 Type 2 diabetes mellitus without complications: Secondary | ICD-10-CM | POA: Insufficient documentation

## 2018-11-18 DIAGNOSIS — N400 Enlarged prostate without lower urinary tract symptoms: Secondary | ICD-10-CM | POA: Insufficient documentation

## 2018-11-18 DIAGNOSIS — N184 Chronic kidney disease, stage 4 (severe): Secondary | ICD-10-CM | POA: Insufficient documentation

## 2018-11-18 DIAGNOSIS — D631 Anemia in chronic kidney disease: Secondary | ICD-10-CM | POA: Insufficient documentation

## 2018-11-18 DIAGNOSIS — Z79899 Other long term (current) drug therapy: Secondary | ICD-10-CM | POA: Insufficient documentation

## 2018-11-18 DIAGNOSIS — J439 Emphysema, unspecified: Secondary | ICD-10-CM | POA: Insufficient documentation

## 2018-11-18 DIAGNOSIS — I700 Atherosclerosis of aorta: Secondary | ICD-10-CM | POA: Insufficient documentation

## 2018-11-18 DIAGNOSIS — I129 Hypertensive chronic kidney disease with stage 1 through stage 4 chronic kidney disease, or unspecified chronic kidney disease: Secondary | ICD-10-CM | POA: Insufficient documentation

## 2018-11-19 ENCOUNTER — Encounter: Payer: Self-pay | Admitting: Oncology

## 2018-11-19 ENCOUNTER — Inpatient Hospital Stay: Payer: Medicare HMO

## 2018-11-19 ENCOUNTER — Inpatient Hospital Stay (HOSPITAL_BASED_OUTPATIENT_CLINIC_OR_DEPARTMENT_OTHER): Payer: Medicare HMO | Admitting: Oncology

## 2018-11-19 ENCOUNTER — Other Ambulatory Visit: Payer: Self-pay

## 2018-11-19 VITALS — BP 145/70 | HR 71 | Temp 98.5°F | Resp 18

## 2018-11-19 DIAGNOSIS — I509 Heart failure, unspecified: Secondary | ICD-10-CM | POA: Diagnosis not present

## 2018-11-19 DIAGNOSIS — E119 Type 2 diabetes mellitus without complications: Secondary | ICD-10-CM | POA: Diagnosis not present

## 2018-11-19 DIAGNOSIS — I129 Hypertensive chronic kidney disease with stage 1 through stage 4 chronic kidney disease, or unspecified chronic kidney disease: Secondary | ICD-10-CM | POA: Diagnosis not present

## 2018-11-19 DIAGNOSIS — N400 Enlarged prostate without lower urinary tract symptoms: Secondary | ICD-10-CM | POA: Diagnosis not present

## 2018-11-19 DIAGNOSIS — N184 Chronic kidney disease, stage 4 (severe): Secondary | ICD-10-CM

## 2018-11-19 DIAGNOSIS — I700 Atherosclerosis of aorta: Secondary | ICD-10-CM | POA: Diagnosis not present

## 2018-11-19 DIAGNOSIS — Z79899 Other long term (current) drug therapy: Secondary | ICD-10-CM | POA: Diagnosis not present

## 2018-11-19 DIAGNOSIS — Z794 Long term (current) use of insulin: Secondary | ICD-10-CM | POA: Diagnosis not present

## 2018-11-19 DIAGNOSIS — D631 Anemia in chronic kidney disease: Secondary | ICD-10-CM

## 2018-11-19 DIAGNOSIS — N189 Chronic kidney disease, unspecified: Secondary | ICD-10-CM | POA: Diagnosis not present

## 2018-11-19 DIAGNOSIS — E785 Hyperlipidemia, unspecified: Secondary | ICD-10-CM | POA: Diagnosis not present

## 2018-11-19 DIAGNOSIS — J90 Pleural effusion, not elsewhere classified: Secondary | ICD-10-CM | POA: Diagnosis not present

## 2018-11-19 DIAGNOSIS — J439 Emphysema, unspecified: Secondary | ICD-10-CM | POA: Diagnosis not present

## 2018-11-19 MED ORDER — EPOETIN ALFA-EPBX 10000 UNIT/ML IJ SOLN
10000.0000 [IU] | Freq: Once | INTRAMUSCULAR | Status: AC
  Administered 2018-11-19: 15:00:00 10000 [IU] via SUBCUTANEOUS
  Filled 2018-11-19: qty 1

## 2018-11-19 MED ORDER — EPOETIN ALFA-EPBX 40000 UNIT/ML IJ SOLN
40000.0000 [IU] | Freq: Once | INTRAMUSCULAR | Status: AC
  Administered 2018-11-19: 40000 [IU] via SUBCUTANEOUS
  Filled 2018-11-19: qty 1

## 2018-11-19 NOTE — Progress Notes (Signed)
Patient does not offer any problems today.  

## 2018-11-19 NOTE — Progress Notes (Signed)
Hematology/Oncology  [facility] [phone] [phone]   Patient Care Team: Baxter Hire, MD as PCP - General (Internal Medicine)  REFERRING PROVIDER: Baxter Hire, MD  CHIEF COMPLAINTS/REASON FOR VISIT:  Follow-up for anemia secondary to chronic kidney disease.  HISTORY OF PRESENTING ILLNESS:   Arthur [name] is a  85 y.o.  male with PMH listed below was seen in consultation at the request of  Baxter Hire, MD  for evaluation of anemia Patient has chronic comorbidities including diabetes, COPD/emphysema, chronic respiratory failure on home oxygen, CKD stage III, CHF, hypertension and hyperlipidemia. Reviewed patient's previous lab records. Extensive medical records review was performed by me. Anemia has been chronic, dated back to at least 2016.  At that time his hemoglobin was around 11-12. Hemoglobin started to trended down below 10 starting February 2020. 02/11/2018 hemoglobin 9, hematocrit 28.6, 06/17/2018 hemoglobin 7.3 hematocrit of 24, 06/27/2018 hemoglobin 5.8 07/15/2018 hemoglobin 7.5 07/29/2018 hemoglobin 8.2, 08/12/2018, hemoglobin 9.2  Patient reports feeling tired and weak.  Chronic shortness of breath at baseline today.  Daughter [name] was called during the clinical encounter and she was able to listen to the discussion and participate as well.   INTERVAL HISTORY Arthur [name] is a 85 y.o. male who has above history reviewed by me today presents for follow up visit for management of anemia Problems and complaints are listed below: Patient currently lives in a facility. He had blood work done at the facility and faxed to [place]. Patient is a poor historian.  Medical records were reviewed. Recently admitted from 10/27/2018-12/02/2018.  Due to pyelonephritis. He denies any new complaints today.  Chronic shortness of breath at baseline.  Denies any dysuria, fever or chills.    Review of Systems  Constitutional: Positive  for fatigue. Negative for appetite change, chills, fever and unexpected weight change.  HENT:   Negative for hearing loss and voice change.   Eyes: Negative for eye problems and icterus.  Respiratory: Positive for shortness of breath. Negative for chest tightness and cough.   Cardiovascular: Negative for chest pain and leg swelling.  Gastrointestinal: Negative for abdominal distention and abdominal pain.  Endocrine: Negative for hot flashes.  Genitourinary: Negative for difficulty urinating, dysuria and frequency.   Musculoskeletal: Negative for arthralgias.  Skin: Negative for itching and rash.  Neurological: Negative for light-headedness and numbness.  Hematological: Negative for adenopathy. Does not bruise/bleed easily.  Psychiatric/Behavioral: Negative for confusion.    MEDICAL HISTORY:  Past Medical History:  Diagnosis Date   Anemia of chronic kidney failure 08/21/2018   CHF (congestive heart failure) (HCC)    CKD (chronic kidney disease), stage III    COPD (chronic obstructive pulmonary disease) ([place])    Diabetes mellitus without complication ([place])    Hyperlipidemia    Hypertension     SURGICAL HISTORY: Past Surgical History:  Procedure Laterality Date   COLONOSCOPY WITH PROPOFOL N/A 06/11/2018   Procedure: COLONOSCOPY WITH PROPOFOL;  Surgeon: Jonathon Bellows, MD;  Location: [facility] ENDOSCOPY;  Service: Gastroenterology;  Laterality: N/A;   COLONOSCOPY WITH PROPOFOL N/A 06/13/2018   Procedure: COLONOSCOPY WITH PROPOFOL;  Surgeon: Jonathon Bellows, MD;  Location: [facility] ENDOSCOPY;  Service: Gastroenterology;  Laterality: N/A;   ESOPHAGOGASTRODUODENOSCOPY Left 06/10/2018   Procedure: ESOPHAGOGASTRODUODENOSCOPY (EGD);  Surgeon: Jonathon Bellows, MD;  Location: [facility] ENDOSCOPY;  Service: Gastroenterology;  Laterality: Left;   GIVENS CAPSULE STUDY N/A 06/28/2018   Procedure: GIVENS CAPSULE STUDY;  Surgeon: Lucilla Lame, MD;  Location: [facility] ENDOSCOPY;  Service:  Endoscopy;  Laterality: N/A;    GIVENS CAPSULE STUDY N/A 06/30/2018   Procedure: GIVENS CAPSULE STUDY;  Surgeon: Jonathon Bellows, MD;  Location: [facility] ENDOSCOPY;  Service: Gastroenterology;  Laterality: N/A;   KYPHOPLASTY N/A 10/31/2018   Procedure: KYPHOPLASTY L2;  Surgeon: [name], MD;  Location: [facility] ORS;  Service: Orthopedics;  Laterality: N/A;    SOCIAL HISTORY: Social History   Socioeconomic History   Marital status: Widowed    Spouse name: Not on file   Number of children: Not on file   Years of education: Not on file   Highest education level: Not on file  Occupational History   Not on file  Social Needs   Financial resource strain: Not hard at all   Food insecurity    Worry: Never true    Inability: Never true   Transportation needs    Medical: No    Non-medical: No  Tobacco Use   Smoking status: Former Smoker    Packs/day: 2.00    Years: 35.00    Pack years: 70.00    Types: Cigarettes    Quit date: 11/04/1982    Years since quitting: 36.0   Smokeless tobacco: Current User  Substance and Sexual Activity   Alcohol use: Not Currently   Drug use: No   Sexual activity: Not Currently  Lifestyle   Physical activity    Days per week: 0 days    Minutes per session: Not on file   Stress: Not at all  [place] on phone: Not on file    Gets together: Not on file    Attends religious service: Not on file    Active member of club or organization: Not on file    Attends meetings of clubs or organizations: Not on file    Relationship status: Not on file   Intimate partner violence    Fear of current or ex partner: Patient refused    Emotionally abused: Patient refused    Physically abused: Patient refused    Forced sexual activity: Patient refused  Other Topics Concern   Not on file  Social History Narrative   Not on file    FAMILY HISTORY: Family History  Problem Relation Age of Onset   COPD Mother    Cancer Father     ALLERGIES:   is allergic to codeine.  MEDICATIONS:  Current Outpatient Medications  Medication Sig Dispense Refill   acetaminophen (TYLENOL) 325 MG tablet Take 2 tablets (650 mg total) by mouth every 6 (six) hours as needed for mild pain (or Fever >/= 101).     albuterol (VENTOLIN HFA) 108 (90 Base) MCG/ACT inhaler Inhale 2 puffs into the lungs every 6 (six) hours as needed for wheezing or shortness of breath. 1 Inhaler 2   amLODipine (NORVASC) 5 MG tablet Take 1 tablet (5 mg total) by mouth daily. 30 tablet 0   docusate sodium (COLACE) 100 MG capsule Take 1 capsule (100 mg total) by mouth 2 (two) times daily. 60 capsule 0   insulin aspart (NOVOLOG) 100 UNIT/ML injection Inject 5 Units into the skin 3 (three) times daily with meals. 10 mL 11   iron polysaccharides (NIFEREX) 150 MG capsule Take 1 capsule (150 mg total) by mouth daily. 30 capsule 0   LEVEMIR 100 UNIT/ML injection Inject 0.12 mLs (12 Units total) into the skin daily. (Patient taking differently: Inject 30 Units into the skin daily. ) 10 mL 0   mometasone-formoterol (DULERA)  100-5 MCG/ACT AERO Inhale 2 puffs into the lungs 2 (two) times daily. 13 g 0   pantoprazole (PROTONIX) 40 MG tablet Take 1 tablet (40 mg total) by mouth 2 (two) times daily. 60 tablet 0   pravastatin (PRAVACHOL) 10 MG tablet Take 10 mg by mouth at bedtime.     tamsulosin (FLOMAX) 0.4 MG CAPS capsule Take 0.4 mg by mouth daily.     torsemide (DEMADEX) 20 MG tablet Take 20 mg by mouth every other day.      vitamin C (ASCORBIC ACID) 500 MG tablet Take 1 tablet (500 mg total) by mouth daily. 30 tablet 2   No current facility-administered medications for this visit.      PHYSICAL EXAMINATION: ECOG PERFORMANCE STATUS: 2 - Symptomatic, <50% confined to bed Vitals:   11/19/18 1421  BP: (!) 145/70  Pulse: 71  Resp: 18  Temp: 98.5 F (36.9 C)   There were no vitals filed for this visit.  Physical Exam Constitutional:      General: He is not in acute  distress.    Appearance: He is ill-appearing.     Comments: Sits in wheel chair  HENT:     Head: Normocephalic and atraumatic.  Eyes:     General: No scleral icterus.    Pupils: Pupils are equal, round, and reactive to light.  Neck:     Musculoskeletal: Normal range of motion and neck supple.  Cardiovascular:     Rate and Rhythm: Normal rate and regular rhythm.     Heart sounds: Normal heart sounds.  Pulmonary:     Effort: Pulmonary effort is normal. No respiratory distress.     Breath sounds: No wheezing.     Comments: Breathes via nasal cannula oxygen. Bibasilar crackles. Abdominal:     General: Bowel sounds are normal. There is no distension.     Palpations: Abdomen is soft. There is no mass.     Tenderness: There is no abdominal tenderness.  Musculoskeletal: Normal range of motion.        General: No deformity.  Skin:    General: Skin is warm and dry.     Findings: No erythema or rash.  Neurological:     Mental Status: He is alert and oriented to person, place, and time.     Cranial Nerves: No cranial nerve deficit.     Coordination: Coordination normal.  Psychiatric:        Mood and Affect: Mood normal.        Behavior: Behavior normal.        Thought Content: Thought content normal.     LABORATORY DATA:  I have reviewed the data as listed Lab Results  Component Value Date   WBC 6.8 11/04/2018   HGB 8.2 (L) 11/04/2018   HCT 25.3 (L) 11/04/2018   MCV 92.7 11/04/2018   PLT 211 11/04/2018   Recent Labs    07/15/18 1544  08/07/18 1818  10/27/18 1550 10/28/18 0500 10/29/18 0547 10/31/18 1244 11/04/18 0547  NA 140   < > 133*   < > 135 135 136  --  134*  K 3.7   < > 3.3*   < > 4.3 4.0 4.2  --  3.7  CL 95*   < > 89*   < > 100 104 102  --  97*  CO2 33*   < > 32   < > 22 24 26   --  28  GLUCOSE 137*   < > 199*   < >  160* 130* 109*  --  107*  BUN 30*   < > 45*   < > 33* 30* 31*  --  47*  CREATININE 2.03*   < > 2.48*   < > 2.41* 2.36* 2.58* 2.26* 2.28*  CALCIUM  7.6*   < > 8.7*   < > 8.7* 8.1* 8.4*  --  8.0*  GFRNONAA 29*   < > 23*   < > 24* 25* 22* 26* 26*  GFRAA 34*   < > 27*   < > 28* 28* 26* 30* 30*  PROT 5.7*  --  8.0  --  7.4  --   --   --   --   ALBUMIN 3.0*  --  3.8  --  3.9  --   --   --   --   AST 20  --  14*  --  15  --   --   --   --   ALT 21  --  12  --  13  --   --   --   --   ALKPHOS 57  --  65  --  69  --   --   --   --   BILITOT 0.3  --  0.9  --  1.0  --   --   --   --    < > = values in this interval not displayed.   Iron/TIBC/Ferritin/ %Sat    Component Value Date/Time   IRON 36 (L) 07/15/2018 1544   TIBC 172 (L) 07/15/2018 1544   FERRITIN 535 (H) 07/15/2018 1544   IRONPCTSAT 21 07/15/2018 1544      RADIOGRAPHIC STUDIES: I have personally reviewed the radiological images as listed and agreed with the findings in the report.  Dg Orbits  Result Date: 10/28/2018 CLINICAL DATA:  Metal working/exposure; clearance prior to MRI EXAM: ORBITS - COMPLETE 4+ VIEW COMPARISON:  None. FINDINGS: There is no evidence of metallic foreign body within the orbits. No significant bone abnormality identified. IMPRESSION: No evidence of metallic foreign body within the orbits. Electronically Signed   By: Marijo Conception M.D.   On: 10/28/2018 15:27   Dg Chest 2 View  Result Date: 10/27/2018 CLINICAL DATA:  Cough and pain EXAM: CHEST - 2 VIEW COMPARISON:  August 08, 2018 FINDINGS: There is a fairly small pleural effusion with atelectatic change in the right base. Lungs elsewhere are clear. Heart size and pulmonary vascularity are normal. No adenopathy. Bones are osteoporotic. IMPRESSION: Fairly small right pleural effusion with right base atelectasis. No edema or consolidation. Cardiac silhouette stable from prior study. No adenopathy evident. Electronically Signed   By: Lowella Grip III M.D.   On: 10/27/2018 15:46   Dg Lumbar Spine 2-3 Views  Result Date: 10/31/2018 CLINICAL DATA:  L2 compression fracture. Status post kyphoplasty. EXAM:  LUMBAR SPINE - 2-3 VIEW; DG C-ARM 1-60 MIN COMPARISON:  MRI dated 10/28/2018 FINDINGS: AP and lateral C-arm images demonstrate the patient has undergone kyphoplasty of L2. IMPRESSION: Kyphoplasty of L2. FLUOROSCOPY TIME:  2 minutes 26 seconds C-arm fluoroscopic images were obtained intraoperatively and submitted for post operative interpretation. Electronically Signed   By: Lorriane Shire M.D.   On: 10/31/2018 11:14   Mr Lumbar Spine Wo Contrast  Result Date: 10/28/2018 CLINICAL DATA:  Acute bilateral low back pain for 2-3 days, nausea, and fatigue. L2 compression fracture on CT. EXAM: MRI LUMBAR SPINE WITHOUT CONTRAST TECHNIQUE: Multiplanar, multisequence MR imaging of the lumbar  spine was performed. No intravenous contrast was administered. COMPARISON:  CT abdomen and pelvis 10/27/2018 FINDINGS: Segmentation: There is transitional lumbosacral anatomy. For consistency with the recent CT report, the transitional segment will be considered a partially sacralized L5. Alignment: Slight right convex curvature of the lumbar spine. No significant listhesis. Vertebrae: L2 inferior endplate compression fracture with 30% vertebral body height loss centrally and mild marrow edema along the inferior endplate without retropulsion. Degenerative endplate marrow changes at multiple levels, most notably L1-2 and L4-5. Nonspecific diffuse bone marrow heterogeneity without suspicious focal lesion identified. Conus medullaris and cauda equina: Conus extends to the upper T12 level. Conus and cauda equina appear normal. Paraspinal and other soft tissues: Mild left-sided paravertebral soft tissue edema at L2-3 adjacent to the compression fracture. Disc levels: Disc desiccation from L1-2 to L4-5. Mild disc space narrowing at L1-2 and moderate to severe narrowing at L4-5. T12-L1: Minimal facet arthrosis without disc herniation or stenosis. L1-2: Mild circumferential disc bulging and mild facet and ligamentum flavum hypertrophy without  significant stenosis. L2-3: Mild disc bulging and moderate facet and ligamentum flavum hypertrophy without significant stenosis. L3-4: Mild disc bulging, moderate facet and ligamentum flavum hypertrophy, and congenitally short pedicles result in moderate spinal stenosis and minimal left neural foraminal stenosis. L4-5: Prominent right-sided ventral epidural fat extending into the right neural foramen with remodeling of the posterior L4 vertebral body and pedicle. Disc bulging, congenitally short pedicles, and moderate to severe facet and ligamentum flavum hypertrophy result in mild-to-moderate spinal stenosis and mild left neural foraminal stenosis. L5-S1: Transitional anatomy. No stenosis. IMPRESSION: 1. Transitional lumbosacral anatomy as above. 2. Acute to subacute L2 compression fracture with 30% height loss. 3. Moderate multifactorial spinal stenosis at L3-4. 4. Mild-to-moderate spinal stenosis at L4-5. Electronically Signed   By: Logan Bores M.D.   On: 10/28/2018 16:54   Dg C-arm 1-60 Min  Result Date: 10/31/2018 CLINICAL DATA:  L2 compression fracture. Status post kyphoplasty. EXAM: LUMBAR SPINE - 2-3 VIEW; DG C-ARM 1-60 MIN COMPARISON:  MRI dated 10/28/2018 FINDINGS: AP and lateral C-arm images demonstrate the patient has undergone kyphoplasty of L2. IMPRESSION: Kyphoplasty of L2. FLUOROSCOPY TIME:  2 minutes 26 seconds C-arm fluoroscopic images were obtained intraoperatively and submitted for post operative interpretation. Electronically Signed   By: Lorriane Shire M.D.   On: 10/31/2018 11:14   Ct Renal Stone Study  Result Date: 10/27/2018 CLINICAL DATA:  Lower back pain for several days, stone disease suspected EXAM: CT ABDOMEN AND PELVIS WITHOUT CONTRAST TECHNIQUE: Multidetector CT imaging of the abdomen and pelvis was performed following the standard protocol without IV contrast. COMPARISON:  CT chest 10/07/2018, CT abdomen pelvis August 07, 2018 FINDINGS: Lower chest: Moderate right pleural  effusion with adjacent volume loss and opacity favoring atelectasis though infection is not fully excluded. Mild left basilar atelectasis. Upper limits normal size heart. No pericardial effusion. Coronary artery calcification is seen. Calcifications are present on the aortic leaflets. Hepatobiliary: Redemonstration of 1.9 cm peripheral E calcified gallstone in the gallbladder. No pericholecystic inflammation or biliary ductal dilatation. No focal hepatic lesions or hepatic surface contour irregularity. Pancreas: Unremarkable. No pancreatic ductal dilatation or surrounding inflammatory changes. Spleen: Normal in size without focal abnormality. Adrenals/Urinary Tract: Nodular thickening of the adrenal glands is similar to prior and may reflect senescent change. No visible or contour deforming renal lesions. No urolithiasis. No hydronephrosis. Mild nonspecific bilateral perinephric stranding is similar to comparison exam. There is circumferential bladder wall thickening and perivesicular hazy stranding greater than expected for  underdistention. Stomach/Bowel: Distal esophagus, stomach and duodenal sweep are unremarkable. No small bowel wall thickening or dilatation. No evidence of obstruction. A normal appendix is visualized. No colonic dilatation or wall thickening. Moderate stool burden. Vascular/Lymphatic: Extensive atherosclerotic calcification of the aorta and branch vessels. Luminal evaluation limited in the absence of contrast media. Focal region of mid mesenteric hazy stranding with numerous reactive appearing clustered mid mesenteric lymph nodes compatible with mesenteritis (2/47). Reproductive: Enlarged prostate. Question a keyhole defect in the central prostate which may reflect prior TURP. Seminal vesicles are symmetric. Other: No abdominopelvic free fluid or free gas. No bowel containing hernias. Fat containing left inguinal hernia and small fat containing umbilical hernia. Musculoskeletal: Multilevel  degenerative changes are present in the imaged portions of the spine. 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 There is a new inferior endplate compression deformity at L2 with approximately 40% height loss centrally. No other acute or suspicious osseous lesion IMPRESSION: 1. New inferior endplate compression deformity at L2 not present on comparison exam from August 07, 2018. Approximately 40% height loss centrally. Correlate with point tenderness. 2. Circumferential bladder wall thickening and perivesicular hazy stranding greater than expected for underdistention. Correlate with urinalysis to exclude cystitis. 3. Focal region of mid mesenteric hazy stranding with numerous reactive appearing clustered mid mesenteric lymph nodes compatible with mesenteritis, similar to comparison. 4. Moderate right pleural effusion with adjacent volume loss and opacity favoring atelectasis though infection is not fully excluded. 5. Aortic Atherosclerosis (ICD10-I70.0). Electronically Signed   By: Lovena Le M.D.   On: 10/27/2018 18:46      ASSESSMENT & PLAN:  1. Anemia of chronic renal failure, unspecified CKD stage   2. CKD (chronic kidney disease) stage 4, GFR 15-29 ml/min (HCC)    Labs are reviewed and discussed with patient. Hemoglobin 8.2. Anemia in CKD.  Recommend continue erythropoietin therapy. Proceed with Retacrit 50,000 units today.   If hemoglobin continues to drop, and he if he becomes symptomatic, can consider blood transfusion. CKD, avoid nephrotoxins. No orders of the defined types were placed in this encounter.   All questions were answered. The patient knows to call the clinic with any problems questions or concerns.  Return of visit: 4 weeks.   Earlie Server, MD, PhD Hematology Oncology [facility] Pager- [phone] 11/19/2018

## 2018-11-20 ENCOUNTER — Ambulatory Visit: Payer: Medicare HMO | Admitting: Oncology

## 2018-11-20 ENCOUNTER — Ambulatory Visit: Payer: Medicare HMO

## 2018-11-20 DIAGNOSIS — Z794 Long term (current) use of insulin: Secondary | ICD-10-CM | POA: Diagnosis not present

## 2018-11-20 DIAGNOSIS — E1121 Type 2 diabetes mellitus with diabetic nephropathy: Secondary | ICD-10-CM | POA: Diagnosis not present

## 2018-11-20 DIAGNOSIS — J431 Panlobular emphysema: Secondary | ICD-10-CM | POA: Diagnosis not present

## 2018-11-26 DIAGNOSIS — I509 Heart failure, unspecified: Secondary | ICD-10-CM | POA: Diagnosis not present

## 2018-11-26 DIAGNOSIS — J181 Lobar pneumonia, unspecified organism: Secondary | ICD-10-CM | POA: Diagnosis not present

## 2018-11-26 DIAGNOSIS — N3000 Acute cystitis without hematuria: Secondary | ICD-10-CM | POA: Diagnosis not present

## 2018-11-26 DIAGNOSIS — N183 Chronic kidney disease, stage 3 unspecified: Secondary | ICD-10-CM | POA: Diagnosis not present

## 2018-11-26 DIAGNOSIS — J440 Chronic obstructive pulmonary disease with acute lower respiratory infection: Secondary | ICD-10-CM | POA: Diagnosis not present

## 2018-11-26 DIAGNOSIS — I130 Hypertensive heart and chronic kidney disease with heart failure and stage 1 through stage 4 chronic kidney disease, or unspecified chronic kidney disease: Secondary | ICD-10-CM | POA: Diagnosis not present

## 2018-11-26 DIAGNOSIS — J9611 Chronic respiratory failure with hypoxia: Secondary | ICD-10-CM | POA: Diagnosis not present

## 2018-11-26 DIAGNOSIS — E1122 Type 2 diabetes mellitus with diabetic chronic kidney disease: Secondary | ICD-10-CM | POA: Diagnosis not present

## 2018-11-26 DIAGNOSIS — S32020D Wedge compression fracture of second lumbar vertebra, subsequent encounter for fracture with routine healing: Secondary | ICD-10-CM | POA: Diagnosis not present

## 2018-11-28 DIAGNOSIS — J441 Chronic obstructive pulmonary disease with (acute) exacerbation: Secondary | ICD-10-CM | POA: Diagnosis not present

## 2018-12-03 DIAGNOSIS — R972 Elevated prostate specific antigen [PSA]: Secondary | ICD-10-CM | POA: Diagnosis not present

## 2018-12-03 DIAGNOSIS — N400 Enlarged prostate without lower urinary tract symptoms: Secondary | ICD-10-CM | POA: Diagnosis not present

## 2018-12-03 DIAGNOSIS — J441 Chronic obstructive pulmonary disease with (acute) exacerbation: Secondary | ICD-10-CM | POA: Diagnosis not present

## 2018-12-03 DIAGNOSIS — N184 Chronic kidney disease, stage 4 (severe): Secondary | ICD-10-CM | POA: Diagnosis not present

## 2018-12-03 DIAGNOSIS — E876 Hypokalemia: Secondary | ICD-10-CM | POA: Diagnosis not present

## 2018-12-03 DIAGNOSIS — R600 Localized edema: Secondary | ICD-10-CM | POA: Diagnosis not present

## 2018-12-03 DIAGNOSIS — N3941 Urge incontinence: Secondary | ICD-10-CM | POA: Diagnosis not present

## 2018-12-03 DIAGNOSIS — N401 Enlarged prostate with lower urinary tract symptoms: Secondary | ICD-10-CM | POA: Diagnosis not present

## 2018-12-03 DIAGNOSIS — Z794 Long term (current) use of insulin: Secondary | ICD-10-CM | POA: Diagnosis not present

## 2018-12-03 DIAGNOSIS — E119 Type 2 diabetes mellitus without complications: Secondary | ICD-10-CM | POA: Diagnosis not present

## 2018-12-04 DIAGNOSIS — E1122 Type 2 diabetes mellitus with diabetic chronic kidney disease: Secondary | ICD-10-CM | POA: Diagnosis not present

## 2018-12-04 DIAGNOSIS — S32020D Wedge compression fracture of second lumbar vertebra, subsequent encounter for fracture with routine healing: Secondary | ICD-10-CM | POA: Diagnosis not present

## 2018-12-04 DIAGNOSIS — J440 Chronic obstructive pulmonary disease with acute lower respiratory infection: Secondary | ICD-10-CM | POA: Diagnosis not present

## 2018-12-04 DIAGNOSIS — N3000 Acute cystitis without hematuria: Secondary | ICD-10-CM | POA: Diagnosis not present

## 2018-12-04 DIAGNOSIS — J181 Lobar pneumonia, unspecified organism: Secondary | ICD-10-CM | POA: Diagnosis not present

## 2018-12-04 DIAGNOSIS — I509 Heart failure, unspecified: Secondary | ICD-10-CM | POA: Diagnosis not present

## 2018-12-04 DIAGNOSIS — N183 Chronic kidney disease, stage 3 unspecified: Secondary | ICD-10-CM | POA: Diagnosis not present

## 2018-12-04 DIAGNOSIS — J9611 Chronic respiratory failure with hypoxia: Secondary | ICD-10-CM | POA: Diagnosis not present

## 2018-12-04 DIAGNOSIS — I130 Hypertensive heart and chronic kidney disease with heart failure and stage 1 through stage 4 chronic kidney disease, or unspecified chronic kidney disease: Secondary | ICD-10-CM | POA: Diagnosis not present

## 2018-12-06 DIAGNOSIS — S32020D Wedge compression fracture of second lumbar vertebra, subsequent encounter for fracture with routine healing: Secondary | ICD-10-CM | POA: Diagnosis not present

## 2018-12-06 DIAGNOSIS — I130 Hypertensive heart and chronic kidney disease with heart failure and stage 1 through stage 4 chronic kidney disease, or unspecified chronic kidney disease: Secondary | ICD-10-CM | POA: Diagnosis not present

## 2018-12-06 DIAGNOSIS — N3000 Acute cystitis without hematuria: Secondary | ICD-10-CM | POA: Diagnosis not present

## 2018-12-06 DIAGNOSIS — J9611 Chronic respiratory failure with hypoxia: Secondary | ICD-10-CM | POA: Diagnosis not present

## 2018-12-06 DIAGNOSIS — E1122 Type 2 diabetes mellitus with diabetic chronic kidney disease: Secondary | ICD-10-CM | POA: Diagnosis not present

## 2018-12-06 DIAGNOSIS — N183 Chronic kidney disease, stage 3 unspecified: Secondary | ICD-10-CM | POA: Diagnosis not present

## 2018-12-06 DIAGNOSIS — J440 Chronic obstructive pulmonary disease with acute lower respiratory infection: Secondary | ICD-10-CM | POA: Diagnosis not present

## 2018-12-06 DIAGNOSIS — I509 Heart failure, unspecified: Secondary | ICD-10-CM | POA: Diagnosis not present

## 2018-12-06 DIAGNOSIS — J181 Lobar pneumonia, unspecified organism: Secondary | ICD-10-CM | POA: Diagnosis not present

## 2018-12-09 DIAGNOSIS — J181 Lobar pneumonia, unspecified organism: Secondary | ICD-10-CM | POA: Diagnosis not present

## 2018-12-09 DIAGNOSIS — I509 Heart failure, unspecified: Secondary | ICD-10-CM | POA: Diagnosis not present

## 2018-12-09 DIAGNOSIS — N3000 Acute cystitis without hematuria: Secondary | ICD-10-CM | POA: Diagnosis not present

## 2018-12-09 DIAGNOSIS — I130 Hypertensive heart and chronic kidney disease with heart failure and stage 1 through stage 4 chronic kidney disease, or unspecified chronic kidney disease: Secondary | ICD-10-CM | POA: Diagnosis not present

## 2018-12-09 DIAGNOSIS — J440 Chronic obstructive pulmonary disease with acute lower respiratory infection: Secondary | ICD-10-CM | POA: Diagnosis not present

## 2018-12-09 DIAGNOSIS — N183 Chronic kidney disease, stage 3 unspecified: Secondary | ICD-10-CM | POA: Diagnosis not present

## 2018-12-09 DIAGNOSIS — J9611 Chronic respiratory failure with hypoxia: Secondary | ICD-10-CM | POA: Diagnosis not present

## 2018-12-09 DIAGNOSIS — E1122 Type 2 diabetes mellitus with diabetic chronic kidney disease: Secondary | ICD-10-CM | POA: Diagnosis not present

## 2018-12-09 DIAGNOSIS — S32020D Wedge compression fracture of second lumbar vertebra, subsequent encounter for fracture with routine healing: Secondary | ICD-10-CM | POA: Diagnosis not present

## 2018-12-12 DIAGNOSIS — J440 Chronic obstructive pulmonary disease with acute lower respiratory infection: Secondary | ICD-10-CM | POA: Diagnosis not present

## 2018-12-12 DIAGNOSIS — S32020D Wedge compression fracture of second lumbar vertebra, subsequent encounter for fracture with routine healing: Secondary | ICD-10-CM | POA: Diagnosis not present

## 2018-12-12 DIAGNOSIS — I130 Hypertensive heart and chronic kidney disease with heart failure and stage 1 through stage 4 chronic kidney disease, or unspecified chronic kidney disease: Secondary | ICD-10-CM | POA: Diagnosis not present

## 2018-12-12 DIAGNOSIS — N3000 Acute cystitis without hematuria: Secondary | ICD-10-CM | POA: Diagnosis not present

## 2018-12-12 DIAGNOSIS — J181 Lobar pneumonia, unspecified organism: Secondary | ICD-10-CM | POA: Diagnosis not present

## 2018-12-12 DIAGNOSIS — N183 Chronic kidney disease, stage 3 unspecified: Secondary | ICD-10-CM | POA: Diagnosis not present

## 2018-12-12 DIAGNOSIS — J9611 Chronic respiratory failure with hypoxia: Secondary | ICD-10-CM | POA: Diagnosis not present

## 2018-12-12 DIAGNOSIS — E1122 Type 2 diabetes mellitus with diabetic chronic kidney disease: Secondary | ICD-10-CM | POA: Diagnosis not present

## 2018-12-12 DIAGNOSIS — I509 Heart failure, unspecified: Secondary | ICD-10-CM | POA: Diagnosis not present

## 2018-12-13 ENCOUNTER — Other Ambulatory Visit
Admission: RE | Admit: 2018-12-13 | Discharge: 2018-12-13 | Disposition: A | Discharge status: Home / Self Care | Payer: Medicare HMO | Source: Ambulatory Visit | Attending: Urology | Admitting: Urology

## 2018-12-13 DIAGNOSIS — N401 Enlarged prostate with lower urinary tract symptoms: Secondary | ICD-10-CM | POA: Diagnosis not present

## 2018-12-13 DIAGNOSIS — R972 Elevated prostate specific antigen [PSA]: Secondary | ICD-10-CM | POA: Diagnosis not present

## 2018-12-13 LAB — PSA: Prostatic Specific Antigen: 1.71 ng/mL (ref 0.00–4.00)

## 2018-12-17 ENCOUNTER — Other Ambulatory Visit: Payer: Self-pay

## 2018-12-17 ENCOUNTER — Telehealth: Payer: Self-pay | Admitting: Nurse Practitioner

## 2018-12-17 DIAGNOSIS — E1122 Type 2 diabetes mellitus with diabetic chronic kidney disease: Secondary | ICD-10-CM | POA: Diagnosis not present

## 2018-12-17 DIAGNOSIS — E7800 Pure hypercholesterolemia, unspecified: Secondary | ICD-10-CM | POA: Diagnosis not present

## 2018-12-17 DIAGNOSIS — E1165 Type 2 diabetes mellitus with hyperglycemia: Secondary | ICD-10-CM | POA: Diagnosis not present

## 2018-12-17 DIAGNOSIS — I130 Hypertensive heart and chronic kidney disease with heart failure and stage 1 through stage 4 chronic kidney disease, or unspecified chronic kidney disease: Secondary | ICD-10-CM | POA: Diagnosis not present

## 2018-12-17 DIAGNOSIS — J9611 Chronic respiratory failure with hypoxia: Secondary | ICD-10-CM | POA: Diagnosis not present

## 2018-12-17 DIAGNOSIS — N3000 Acute cystitis without hematuria: Secondary | ICD-10-CM | POA: Diagnosis not present

## 2018-12-17 DIAGNOSIS — J440 Chronic obstructive pulmonary disease with acute lower respiratory infection: Secondary | ICD-10-CM | POA: Diagnosis not present

## 2018-12-17 DIAGNOSIS — I509 Heart failure, unspecified: Secondary | ICD-10-CM | POA: Diagnosis not present

## 2018-12-17 DIAGNOSIS — N183 Chronic kidney disease, stage 3 unspecified: Secondary | ICD-10-CM | POA: Diagnosis not present

## 2018-12-17 DIAGNOSIS — S32020D Wedge compression fracture of second lumbar vertebra, subsequent encounter for fracture with routine healing: Secondary | ICD-10-CM | POA: Diagnosis not present

## 2018-12-17 DIAGNOSIS — J181 Lobar pneumonia, unspecified organism: Secondary | ICD-10-CM | POA: Diagnosis not present

## 2018-12-17 DIAGNOSIS — J431 Panlobular emphysema: Secondary | ICD-10-CM | POA: Diagnosis not present

## 2018-12-17 DIAGNOSIS — M545 Low back pain: Secondary | ICD-10-CM | POA: Diagnosis not present

## 2018-12-17 DIAGNOSIS — N1832 Chronic kidney disease, stage 3b: Secondary | ICD-10-CM | POA: Diagnosis not present

## 2018-12-17 DIAGNOSIS — I129 Hypertensive chronic kidney disease with stage 1 through stage 4 chronic kidney disease, or unspecified chronic kidney disease: Secondary | ICD-10-CM | POA: Diagnosis not present

## 2018-12-17 NOTE — Progress Notes (Signed)
Patient pre screened for office appointment, no questions or concerns today. Patient reminded of upcoming appointment time and date. Daughter supplied information for pre-screening.

## 2018-12-17 NOTE — Telephone Encounter (Signed)
Scheduled followup telemedicine palliative care f/u appointment for 01/06/2019 at 4:30pm

## 2018-12-18 ENCOUNTER — Inpatient Hospital Stay: Payer: Medicare HMO

## 2018-12-18 ENCOUNTER — Encounter: Payer: Self-pay | Admitting: Oncology

## 2018-12-18 ENCOUNTER — Inpatient Hospital Stay: Payer: Medicare HMO | Attending: Oncology

## 2018-12-18 ENCOUNTER — Inpatient Hospital Stay (HOSPITAL_BASED_OUTPATIENT_CLINIC_OR_DEPARTMENT_OTHER): Payer: Medicare HMO | Admitting: Oncology

## 2018-12-18 ENCOUNTER — Other Ambulatory Visit: Payer: Self-pay

## 2018-12-18 VITALS — BP 151/62 | HR 82 | Temp 98.1°F | Resp 16

## 2018-12-18 DIAGNOSIS — N189 Chronic kidney disease, unspecified: Secondary | ICD-10-CM | POA: Diagnosis not present

## 2018-12-18 DIAGNOSIS — D631 Anemia in chronic kidney disease: Secondary | ICD-10-CM | POA: Insufficient documentation

## 2018-12-18 DIAGNOSIS — Z794 Long term (current) use of insulin: Secondary | ICD-10-CM | POA: Insufficient documentation

## 2018-12-18 DIAGNOSIS — I509 Heart failure, unspecified: Secondary | ICD-10-CM | POA: Diagnosis not present

## 2018-12-18 DIAGNOSIS — Z87891 Personal history of nicotine dependence: Secondary | ICD-10-CM | POA: Diagnosis not present

## 2018-12-18 DIAGNOSIS — Z79899 Other long term (current) drug therapy: Secondary | ICD-10-CM | POA: Insufficient documentation

## 2018-12-18 DIAGNOSIS — I10 Essential (primary) hypertension: Secondary | ICD-10-CM | POA: Insufficient documentation

## 2018-12-18 DIAGNOSIS — N183 Chronic kidney disease, stage 3 unspecified: Secondary | ICD-10-CM | POA: Insufficient documentation

## 2018-12-18 DIAGNOSIS — N184 Chronic kidney disease, stage 4 (severe): Secondary | ICD-10-CM

## 2018-12-18 DIAGNOSIS — J449 Chronic obstructive pulmonary disease, unspecified: Secondary | ICD-10-CM | POA: Insufficient documentation

## 2018-12-18 DIAGNOSIS — E785 Hyperlipidemia, unspecified: Secondary | ICD-10-CM | POA: Insufficient documentation

## 2018-12-18 DIAGNOSIS — E119 Type 2 diabetes mellitus without complications: Secondary | ICD-10-CM | POA: Insufficient documentation

## 2018-12-18 DIAGNOSIS — I129 Hypertensive chronic kidney disease with stage 1 through stage 4 chronic kidney disease, or unspecified chronic kidney disease: Secondary | ICD-10-CM | POA: Diagnosis not present

## 2018-12-18 LAB — CBC WITH DIFFERENTIAL/PLATELET
Abs Immature Granulocytes: 0.08 10*3/uL — ABNORMAL HIGH (ref 0.00–0.07)
Basophils Absolute: 0.1 10*3/uL (ref 0.0–0.1)
Basophils Relative: 1 %
Eosinophils Absolute: 0.1 10*3/uL (ref 0.0–0.5)
Eosinophils Relative: 1 %
HCT: 27 % — ABNORMAL LOW (ref 39.0–52.0)
Hemoglobin: 8 g/dL — ABNORMAL LOW (ref 13.0–17.0)
Immature Granulocytes: 1 %
Lymphocytes Relative: 15 %
Lymphs Abs: 1.6 10*3/uL (ref 0.7–4.0)
MCH: 30 pg (ref 26.0–34.0)
MCHC: 29.6 g/dL — ABNORMAL LOW (ref 30.0–36.0)
MCV: 101.1 fL — ABNORMAL HIGH (ref 80.0–100.0)
Monocytes Absolute: 0.8 10*3/uL (ref 0.1–1.0)
Monocytes Relative: 8 %
Neutro Abs: 8.1 10*3/uL — ABNORMAL HIGH (ref 1.7–7.7)
Neutrophils Relative %: 74 %
Platelets: 344 10*3/uL (ref 150–400)
RBC: 2.67 MIL/uL — ABNORMAL LOW (ref 4.22–5.81)
RDW: 18.3 % — ABNORMAL HIGH (ref 11.5–15.5)
WBC: 10.7 10*3/uL — ABNORMAL HIGH (ref 4.0–10.5)
nRBC: 0.2 % (ref 0.0–0.2)

## 2018-12-18 MED ORDER — EPOETIN ALFA-EPBX 10000 UNIT/ML IJ SOLN
20000.0000 [IU] | Freq: Once | INTRAMUSCULAR | Status: AC
  Administered 2018-12-18: 20000 [IU] via SUBCUTANEOUS
  Filled 2018-12-18: qty 2

## 2018-12-18 MED ORDER — EPOETIN ALFA-EPBX 40000 UNIT/ML IJ SOLN
40000.0000 [IU] | Freq: Once | INTRAMUSCULAR | Status: AC
  Administered 2018-12-18: 40000 [IU] via SUBCUTANEOUS
  Filled 2018-12-18: qty 1

## 2018-12-18 MED ORDER — EPOETIN ALFA-EPBX 40000 UNIT/ML IJ SOLN: 60000.0000 [IU] | Freq: Once | INTRAMUSCULAR | Status: DC

## 2018-12-18 NOTE — Progress Notes (Signed)
Pre-assessment done yesterday.

## 2018-12-18 NOTE — Progress Notes (Signed)
Hematology/Oncology  [facility] Telephone:([phone] [phone]   Patient Care Team: Baxter Hire, MD as PCP - General (Internal Medicine) Earlie Server, MD as Consulting Physician (Oncology)  REFERRING PROVIDER: Baxter Hire, MD  CHIEF COMPLAINTS/REASON FOR VISIT:  Follow-up for anemia secondary to chronic kidney disease.  HISTORY OF PRESENTING ILLNESS:   Arthur [name] is a  85 y.o.  male with PMH listed below was seen in consultation at the request of  Baxter Hire, MD  for evaluation of anemia Patient has chronic comorbidities including diabetes, COPD/emphysema, chronic respiratory failure on home oxygen, CKD stage III, CHF, hypertension and hyperlipidemia. Reviewed patient's previous lab records. Extensive medical records review was performed by me. Anemia has been chronic, dated back to at least 2016.  At that time his hemoglobin was around 11-12. Hemoglobin started to trended down below 10 starting February 2020. 02/11/2018 hemoglobin 9, hematocrit 28.6, 06/17/2018 hemoglobin 7.3 hematocrit of 24, 06/27/2018 hemoglobin 5.8 07/15/2018 hemoglobin 7.5 07/29/2018 hemoglobin 8.2, 08/12/2018, hemoglobin 9.2  Patient reports feeling tired and weak.  Chronic shortness of breath at baseline today.  Daughter [name] was called during the clinical encounter and she was able to listen to the discussion and participate as well.   INTERVAL HISTORY Arthur [name] is a 85 y.o. male who has above history reviewed by me today presents for follow up visit for management of anemia Problems and complaints are listed below: Patient currently lives in a facility. I called patient's daughter [name]. Patient says " I can be feeling better".  Chronic cough unchanged. Is a poor historian. Denies any new complaints.  Chronic fatigue and shortness of breath.  Review of Systems  Constitutional: Positive for fatigue. Negative for appetite change, chills, fever and  unexpected weight change.  HENT:   Negative for hearing loss and voice change.   Eyes: Negative for eye problems and icterus.  Respiratory: Positive for shortness of breath. Negative for chest tightness and cough.   Cardiovascular: Positive for leg swelling. Negative for chest pain.  Gastrointestinal: Negative for abdominal distention and abdominal pain.  Endocrine: Negative for hot flashes.  Genitourinary: Negative for difficulty urinating and dysuria.   Musculoskeletal: Positive for back pain.       Chronic back pain  Skin: Negative for itching and rash.  Neurological: Negative for light-headedness and numbness.  Hematological: Negative for adenopathy. Does not bruise/bleed easily.  Psychiatric/Behavioral: Negative for confusion.    MEDICAL HISTORY:  Past Medical History:  Diagnosis Date  . Anemia of chronic kidney failure 08/21/2018  . CHF (congestive heart failure) ([place])   . CKD (chronic kidney disease), stage III   . COPD (chronic obstructive pulmonary disease) ([place])   . Diabetes mellitus without complication ([place])   . Hyperlipidemia   . Hypertension     SURGICAL HISTORY: Past Surgical History:  Procedure Laterality Date  . COLONOSCOPY WITH PROPOFOL N/A 06/11/2018   Procedure: COLONOSCOPY WITH PROPOFOL;  Surgeon: Jonathon Bellows, MD;  Location: [facility] ENDOSCOPY;  Service: Gastroenterology;  Laterality: N/A;  . COLONOSCOPY WITH PROPOFOL N/A 06/13/2018   Procedure: COLONOSCOPY WITH PROPOFOL;  Surgeon: Jonathon Bellows, MD;  Location: [facility] ENDOSCOPY;  Service: Gastroenterology;  Laterality: N/A;  . ESOPHAGOGASTRODUODENOSCOPY Left 06/10/2018   Procedure: ESOPHAGOGASTRODUODENOSCOPY (EGD);  Surgeon: Jonathon Bellows, MD;  Location: [facility] ENDOSCOPY;  Service: Gastroenterology;  Laterality: Left;  . GIVENS CAPSULE STUDY N/A 06/28/2018   Procedure: GIVENS CAPSULE STUDY;  Surgeon: Lucilla Lame, MD;  Location: [facility] ENDOSCOPY;  Service: Endoscopy;  Laterality:  N/A;  . GIVENS CAPSULE STUDY N/A 06/30/2018    Procedure: GIVENS CAPSULE STUDY;  Surgeon: Jonathon Bellows, MD;  Location: [facility] ENDOSCOPY;  Service: Gastroenterology;  Laterality: N/A;  . KYPHOPLASTY N/A 10/31/2018   Procedure: KYPHOPLASTY L2;  Surgeon: [name], MD;  Location: [facility] ORS;  Service: Orthopedics;  Laterality: N/A;    SOCIAL HISTORY: Social History   Socioeconomic History  . Marital status: Widowed    Spouse name: Not on file  . Number of children: Not on file  . Years of education: Not on file  . Highest education level: Not on file  Occupational History  . Not on file  Tobacco Use  . Smoking status: Former Smoker    Packs/day: 2.00    Years: 35.00    Pack years: 70.00    Types: Cigarettes    Quit date: 11/04/1982    Years since quitting: 36.1  . Smokeless tobacco: Current User  Substance and Sexual Activity  . Alcohol use: Not Currently  . Drug use: No  . Sexual activity: Not Currently  Other Topics Concern  . Not on file  Social History Narrative  . Not on file   Social Determinants of Health   Financial Resource Strain: Low Risk   . Difficulty of Paying Living Expenses: Not hard at all  Food Insecurity: No Food Insecurity  . Worried About Charity fundraiser in the Last Year: Never true  . Ran Out of Food in the Last Year: Never true  Transportation Needs: No Transportation Needs  . Lack of Transportation (Medical): No  . Lack of Transportation (Non-Medical): No  Physical Activity: Unknown  . Days of Exercise per Week: 0 days  . Minutes of Exercise per Session: Not on file  Stress: No Stress Concern Present  . Feeling of Stress : Not at all  Social Connections:   . Frequency of Communication with Friends and Family: Not on file  . Frequency of Social Gatherings with Friends and Family: Not on file  . Attends Religious Services: Not on file  . Active Member of Clubs or Organizations: Not on file  . Attends Archivist Meetings: Not on file  . Marital Status: Not on file  Intimate  Partner Violence: Unknown  . Fear of Current or Ex-Partner: Patient refused  . Emotionally Abused: Patient refused  . Physically Abused: Patient refused  . Sexually Abused: Patient refused    FAMILY HISTORY: Family History  Problem Relation Age of Onset  . COPD Mother   . Cancer Father     ALLERGIES:  is allergic to codeine.  MEDICATIONS:  Current Outpatient Medications  Medication Sig Dispense Refill  . acetaminophen (TYLENOL) 325 MG tablet Take 2 tablets (650 mg total) by mouth every 6 (six) hours as needed for mild pain (or Fever >/= 101).    Marland Kitchen albuterol (VENTOLIN HFA) 108 (90 Base) MCG/ACT inhaler Inhale 2 puffs into the lungs every 6 (six) hours as needed for wheezing or shortness of breath. 1 Inhaler 2  . amLODipine (NORVASC) 5 MG tablet Take 1 tablet (5 mg total) by mouth daily. 30 tablet 0  . docusate sodium (COLACE) 100 MG capsule Take 1 capsule (100 mg total) by mouth 2 (two) times daily. 60 capsule 0  . insulin aspart (NOVOLOG) 100 UNIT/ML injection Inject 5 Units into the skin 3 (three) times daily with meals. 10 mL 11  . iron polysaccharides (NIFEREX) 150 MG capsule Take 1 capsule (150 mg total) by mouth daily. [place]  capsule 0  . LEVEMIR 100 UNIT/ML injection Inject 0.12 mLs (12 Units total) into the skin daily. (Patient taking differently: Inject 30 Units into the skin daily. ) 10 mL 0  . mometasone-formoterol (DULERA) 100-5 MCG/ACT AERO Inhale 2 puffs into the lungs 2 (two) times daily. 13 g 0  . pantoprazole (PROTONIX) 40 MG tablet Take 1 tablet (40 mg total) by mouth 2 (two) times daily. 60 tablet 0  . pravastatin (PRAVACHOL) 10 MG tablet Take 10 mg by mouth at bedtime.    . tamsulosin (FLOMAX) 0.4 MG CAPS capsule Take 0.4 mg by mouth daily.    Marland Kitchen torsemide (DEMADEX) 20 MG tablet Take 20 mg by mouth every other day.     . vitamin C (ASCORBIC ACID) 500 MG tablet Take 1 tablet (500 mg total) by mouth daily. 30 tablet 2   No current facility-administered medications for  this visit.     PHYSICAL EXAMINATION: ECOG PERFORMANCE STATUS: 2 - Symptomatic, <50% confined to bed Vitals:   12/17/18 1123 12/18/18 1450  BP: (!) 151/62 (!) 151/62  Pulse: 82 82  Resp: 16 16  Temp: 98.1 F (36.7 C) 98.1 F (36.7 C)   There were no vitals filed for this visit.  Physical Exam Constitutional:      General: He is not in acute distress.    Appearance: He is ill-appearing.     Comments: Sits in wheel chair  HENT:     Head: Normocephalic and atraumatic.  Eyes:     General: No scleral icterus.    Pupils: Pupils are equal, round, and reactive to light.  Cardiovascular:     Rate and Rhythm: Normal rate and regular rhythm.     Heart sounds: Normal heart sounds.  Pulmonary:     Effort: Pulmonary effort is normal. No respiratory distress.     Breath sounds: No wheezing.     Comments: Breathes via nasal cannula oxygen. Bibasilar crackles. Abdominal:     General: Bowel sounds are normal. There is no distension.     Palpations: Abdomen is soft. There is no mass.     Tenderness: There is no abdominal tenderness.  Musculoskeletal:        General: No deformity. Normal range of motion.     Cervical back: Normal range of motion and neck supple.  Skin:    General: Skin is warm and dry.     Coloration: Skin is pale.     Findings: No erythema or rash.  Neurological:     Mental Status: He is alert and oriented to person, place, and time.     Cranial Nerves: No cranial nerve deficit.     Coordination: Coordination normal.  Psychiatric:        Mood and Affect: Mood normal.        Behavior: Behavior normal.        Thought Content: Thought content normal.     LABORATORY DATA:  I have reviewed the data as listed Lab Results  Component Value Date   WBC 10.7 (H) 12/18/2018   HGB 8.0 (L) 12/18/2018   HCT 27.0 (L) 12/18/2018   MCV 101.1 (H) 12/18/2018   PLT 344 12/18/2018   Recent Labs    07/15/18 1544 08/07/18 1818 10/27/18 1550 10/28/18 0500 10/29/18 0547  10/31/18 1244 11/04/18 0547  NA 140 133* 135 135 136  --  134*  K 3.7 3.3* 4.3 4.0 4.2  --  3.7  CL 95* 89* 100 104 102  --  97*  CO2 33* 32 22 24 26   --  28  GLUCOSE 137* 199* 160* 130* 109*  --  107*  BUN 30* 45* 33* 30* 31*  --  47*  CREATININE 2.03* 2.48* 2.41* 2.36* 2.58* 2.26* 2.28*  CALCIUM 7.6* 8.7* 8.7* 8.1* 8.4*  --  8.0*  GFRNONAA 29* 23* 24* 25* 22* 26* 26*  GFRAA 34* 27* 28* 28* 26* 30* 30*  PROT 5.7* 8.0 7.4  --   --   --   --   ALBUMIN 3.0* 3.8 3.9  --   --   --   --   AST 20 14* 15  --   --   --   --   ALT 21 12 13   --   --   --   --   ALKPHOS 57 65 69  --   --   --   --   BILITOT 0.3 0.9 1.0  --   --   --   --    Iron/TIBC/Ferritin/ %Sat    Component Value Date/Time   IRON 36 (L) 07/15/2018 1544   TIBC 172 (L) 07/15/2018 1544   FERRITIN 535 (H) 07/15/2018 1544   IRONPCTSAT 21 07/15/2018 1544      RADIOGRAPHIC STUDIES: I have personally reviewed the radiological images as listed and agreed with the findings in the report.  No results found.    ASSESSMENT & PLAN:  1. Anemia of chronic renal failure, unspecified CKD stage    Labs reviewed and discussed with patient Hemoglobin 8 Anemia in CKD.  Patient received Retacrit 50,000 units  I will increase Retacrit dose to 60,000 units. Check CBC, folate, B12, iron, TIBC, ferritin in 2 weeks to evaluate response and rule out other etiologies  #Daughter [name] was called.  Plan was discussed with both [name] and patient.  Both agree with the plan. Orders Placed This Encounter  Procedures  . CBC with Differential    Standing Status:   Future    Standing Expiration Date:   12/18/2019  . Ferritin    Standing Status:   Future    Standing Expiration Date:   12/18/2019  . Iron and TIBC    Standing Status:   Future    Standing Expiration Date:   12/18/2019  . Folate    Standing Status:   Future    Standing Expiration Date:   12/18/2019  . Vitamin B12    Standing Status:   Future    Standing Expiration Date:    12/18/2019  . CBC with Differential    Standing Status:   Future    Standing Expiration Date:   12/18/2019  . Hold Tube- Blood Bank    Standing Status:   Future    Standing Expiration Date:   12/18/2019    All questions were answered. The patient knows to call the clinic with any problems questions or concerns.  Return of visit: 4 weeks.   Earlie Server, MD, PhD Hematology Oncology [facility] Pager- [phone] 12/18/2018

## 2018-12-19 ENCOUNTER — Other Ambulatory Visit: Payer: Medicare HMO | Admitting: Nurse Practitioner

## 2018-12-20 DIAGNOSIS — I130 Hypertensive heart and chronic kidney disease with heart failure and stage 1 through stage 4 chronic kidney disease, or unspecified chronic kidney disease: Secondary | ICD-10-CM | POA: Diagnosis not present

## 2018-12-20 DIAGNOSIS — J181 Lobar pneumonia, unspecified organism: Secondary | ICD-10-CM | POA: Diagnosis not present

## 2018-12-20 DIAGNOSIS — J440 Chronic obstructive pulmonary disease with acute lower respiratory infection: Secondary | ICD-10-CM | POA: Diagnosis not present

## 2018-12-20 DIAGNOSIS — N3000 Acute cystitis without hematuria: Secondary | ICD-10-CM | POA: Diagnosis not present

## 2018-12-20 DIAGNOSIS — E1122 Type 2 diabetes mellitus with diabetic chronic kidney disease: Secondary | ICD-10-CM | POA: Diagnosis not present

## 2018-12-20 DIAGNOSIS — J9611 Chronic respiratory failure with hypoxia: Secondary | ICD-10-CM | POA: Diagnosis not present

## 2018-12-20 DIAGNOSIS — S32020D Wedge compression fracture of second lumbar vertebra, subsequent encounter for fracture with routine healing: Secondary | ICD-10-CM | POA: Diagnosis not present

## 2018-12-20 DIAGNOSIS — N183 Chronic kidney disease, stage 3 unspecified: Secondary | ICD-10-CM | POA: Diagnosis not present

## 2018-12-20 DIAGNOSIS — I509 Heart failure, unspecified: Secondary | ICD-10-CM | POA: Diagnosis not present

## 2018-12-23 ENCOUNTER — Other Ambulatory Visit
Admission: RE | Admit: 2018-12-23 | Discharge: 2018-12-23 | Disposition: A | Discharge status: Home / Self Care | Payer: Medicare HMO | Source: Ambulatory Visit | Attending: Orthopedic Surgery | Admitting: Orthopedic Surgery

## 2018-12-23 ENCOUNTER — Other Ambulatory Visit: Payer: Self-pay | Admitting: Orthopedic Surgery

## 2018-12-23 DIAGNOSIS — J9611 Chronic respiratory failure with hypoxia: Secondary | ICD-10-CM | POA: Diagnosis not present

## 2018-12-23 DIAGNOSIS — Z9889 Other specified postprocedural states: Secondary | ICD-10-CM | POA: Diagnosis not present

## 2018-12-23 DIAGNOSIS — I130 Hypertensive heart and chronic kidney disease with heart failure and stage 1 through stage 4 chronic kidney disease, or unspecified chronic kidney disease: Secondary | ICD-10-CM | POA: Diagnosis not present

## 2018-12-23 DIAGNOSIS — Z20828 Contact with and (suspected) exposure to other viral communicable diseases: Secondary | ICD-10-CM | POA: Diagnosis not present

## 2018-12-23 DIAGNOSIS — Z01812 Encounter for preprocedural laboratory examination: Secondary | ICD-10-CM | POA: Insufficient documentation

## 2018-12-23 DIAGNOSIS — E1122 Type 2 diabetes mellitus with diabetic chronic kidney disease: Secondary | ICD-10-CM | POA: Diagnosis not present

## 2018-12-23 DIAGNOSIS — N183 Chronic kidney disease, stage 3 unspecified: Secondary | ICD-10-CM | POA: Diagnosis not present

## 2018-12-23 DIAGNOSIS — I509 Heart failure, unspecified: Secondary | ICD-10-CM | POA: Diagnosis not present

## 2018-12-23 DIAGNOSIS — N3000 Acute cystitis without hematuria: Secondary | ICD-10-CM | POA: Diagnosis not present

## 2018-12-23 DIAGNOSIS — J181 Lobar pneumonia, unspecified organism: Secondary | ICD-10-CM | POA: Diagnosis not present

## 2018-12-23 DIAGNOSIS — S32020D Wedge compression fracture of second lumbar vertebra, subsequent encounter for fracture with routine healing: Secondary | ICD-10-CM | POA: Diagnosis not present

## 2018-12-23 DIAGNOSIS — S32030A Wedge compression fracture of third lumbar vertebra, initial encounter for closed fracture: Secondary | ICD-10-CM | POA: Diagnosis not present

## 2018-12-23 DIAGNOSIS — J440 Chronic obstructive pulmonary disease with acute lower respiratory infection: Secondary | ICD-10-CM | POA: Diagnosis not present

## 2018-12-23 NOTE — OR Nursing (Signed)
CBC results sent to Dr. Rudene Christians for review.

## 2018-12-24 ENCOUNTER — Ambulatory Visit
Admission: RE | Admit: 2018-12-24 | Discharge: 2018-12-24 | Disposition: A | Discharge status: Home / Self Care | Payer: Medicare HMO | Attending: Orthopedic Surgery | Admitting: Orthopedic Surgery

## 2018-12-24 ENCOUNTER — Ambulatory Visit: Payer: Medicare HMO | Admitting: Certified Registered Nurse Anesthetist

## 2018-12-24 ENCOUNTER — Ambulatory Visit: Payer: Medicare HMO

## 2018-12-24 ENCOUNTER — Other Ambulatory Visit: Payer: Self-pay

## 2018-12-24 ENCOUNTER — Encounter
Admission: RE | Disposition: A | Discharge status: Home / Self Care | Payer: Self-pay | Source: Home / Self Care | Attending: Orthopedic Surgery

## 2018-12-24 ENCOUNTER — Encounter: Payer: Self-pay | Admitting: Orthopedic Surgery

## 2018-12-24 DIAGNOSIS — Z794 Long term (current) use of insulin: Secondary | ICD-10-CM | POA: Insufficient documentation

## 2018-12-24 DIAGNOSIS — G4733 Obstructive sleep apnea (adult) (pediatric): Secondary | ICD-10-CM | POA: Diagnosis not present

## 2018-12-24 DIAGNOSIS — I509 Heart failure, unspecified: Secondary | ICD-10-CM | POA: Insufficient documentation

## 2018-12-24 DIAGNOSIS — X58XXXA Exposure to other specified factors, initial encounter: Secondary | ICD-10-CM | POA: Insufficient documentation

## 2018-12-24 DIAGNOSIS — I129 Hypertensive chronic kidney disease with stage 1 through stage 4 chronic kidney disease, or unspecified chronic kidney disease: Secondary | ICD-10-CM | POA: Diagnosis not present

## 2018-12-24 DIAGNOSIS — S32030A Wedge compression fracture of third lumbar vertebra, initial encounter for closed fracture: Secondary | ICD-10-CM | POA: Diagnosis not present

## 2018-12-24 DIAGNOSIS — Z9981 Dependence on supplemental oxygen: Secondary | ICD-10-CM | POA: Diagnosis not present

## 2018-12-24 DIAGNOSIS — E1122 Type 2 diabetes mellitus with diabetic chronic kidney disease: Secondary | ICD-10-CM | POA: Diagnosis not present

## 2018-12-24 DIAGNOSIS — I130 Hypertensive heart and chronic kidney disease with heart failure and stage 1 through stage 4 chronic kidney disease, or unspecified chronic kidney disease: Secondary | ICD-10-CM | POA: Diagnosis not present

## 2018-12-24 DIAGNOSIS — Z87891 Personal history of nicotine dependence: Secondary | ICD-10-CM | POA: Diagnosis not present

## 2018-12-24 DIAGNOSIS — J449 Chronic obstructive pulmonary disease, unspecified: Secondary | ICD-10-CM | POA: Diagnosis not present

## 2018-12-24 DIAGNOSIS — Z79899 Other long term (current) drug therapy: Secondary | ICD-10-CM | POA: Diagnosis not present

## 2018-12-24 DIAGNOSIS — Z419 Encounter for procedure for purposes other than remedying health state, unspecified: Secondary | ICD-10-CM

## 2018-12-24 DIAGNOSIS — N400 Enlarged prostate without lower urinary tract symptoms: Secondary | ICD-10-CM | POA: Insufficient documentation

## 2018-12-24 DIAGNOSIS — N183 Chronic kidney disease, stage 3 unspecified: Secondary | ICD-10-CM | POA: Insufficient documentation

## 2018-12-24 DIAGNOSIS — Z6833 Body mass index (BMI) 33.0-33.9, adult: Secondary | ICD-10-CM | POA: Insufficient documentation

## 2018-12-24 DIAGNOSIS — D649 Anemia, unspecified: Secondary | ICD-10-CM | POA: Diagnosis not present

## 2018-12-24 DIAGNOSIS — S3219XA Other fracture of sacrum, initial encounter for closed fracture: Secondary | ICD-10-CM | POA: Diagnosis not present

## 2018-12-24 DIAGNOSIS — E785 Hyperlipidemia, unspecified: Secondary | ICD-10-CM | POA: Diagnosis not present

## 2018-12-24 DIAGNOSIS — S32020A Wedge compression fracture of second lumbar vertebra, initial encounter for closed fracture: Secondary | ICD-10-CM | POA: Diagnosis not present

## 2018-12-24 DIAGNOSIS — Z981 Arthrodesis status: Secondary | ICD-10-CM | POA: Diagnosis not present

## 2018-12-24 DIAGNOSIS — N184 Chronic kidney disease, stage 4 (severe): Secondary | ICD-10-CM | POA: Diagnosis not present

## 2018-12-24 HISTORY — PX: KYPHOPLASTY: SHX5884

## 2018-12-24 LAB — GLUCOSE, CAPILLARY
Glucose-Capillary: 138 mg/dL — ABNORMAL HIGH (ref 70–99)
Glucose-Capillary: 133 mg/dL — ABNORMAL HIGH (ref 70–99)

## 2018-12-24 LAB — SARS CORONAVIRUS 2 (TAT 6-24 HRS): SARS Coronavirus 2: NEGATIVE

## 2018-12-24 SURGERY — KYPHOPLASTY
Anesthesia: General | Site: Spine Lumbar

## 2018-12-24 MED ORDER — FAMOTIDINE 20 MG PO TABS: 20.0000 mg | ORAL_TABLET | Freq: Once | ORAL | Status: DC

## 2018-12-24 MED ORDER — CEFAZOLIN SODIUM-DEXTROSE 2-4 GM/100ML-% IV SOLN
2.0000 g | INTRAVENOUS | Status: AC
  Administered 2018-12-24: 2 g via INTRAVENOUS

## 2018-12-24 MED ORDER — CEFAZOLIN SODIUM-DEXTROSE 2-4 GM/100ML-% IV SOLN
INTRAVENOUS | Status: AC
  Filled 2018-12-24: qty 100

## 2018-12-24 MED ORDER — FENTANYL CITRATE (PF) 100 MCG/2ML IJ SOLN
INTRAMUSCULAR | Status: AC
  Filled 2018-12-24: qty 2

## 2018-12-24 MED ORDER — FENTANYL CITRATE (PF) 100 MCG/2ML IJ SOLN: 25.0000 ug | INTRAMUSCULAR | Status: DC | PRN

## 2018-12-24 MED ORDER — LIDOCAINE HCL 1 % IJ SOLN
INTRAMUSCULAR | Status: DC | PRN
  Administered 2018-12-24: 20 mL

## 2018-12-24 MED ORDER — MIDAZOLAM HCL 2 MG/2ML IJ SOLN
INTRAMUSCULAR | Status: AC
  Filled 2018-12-24: qty 2

## 2018-12-24 MED ORDER — PROPOFOL 500 MG/50ML IV EMUL
INTRAVENOUS | Status: DC | PRN
  Administered 2018-12-24: 50 ug/kg/min via INTRAVENOUS

## 2018-12-24 MED ORDER — BUPIVACAINE-EPINEPHRINE (PF) 0.5% -1:200000 IJ SOLN
INTRAMUSCULAR | Status: DC | PRN
  Administered 2018-12-24: 30 mL

## 2018-12-24 MED ORDER — NEOMYCIN-POLYMYXIN B GU 40-200000 IR SOLN
Status: AC
  Filled 2018-12-24: qty 20

## 2018-12-24 MED ORDER — ONDANSETRON HCL 4 MG/2ML IJ SOLN
INTRAMUSCULAR | Status: DC | PRN
  Administered 2018-12-24: 4 mg via INTRAVENOUS

## 2018-12-24 MED ORDER — METOCLOPRAMIDE HCL 10 MG PO TABS: 5.0000 mg | ORAL_TABLET | Freq: Three times a day (TID) | ORAL | Status: DC | PRN

## 2018-12-24 MED ORDER — HYDROCODONE-ACETAMINOPHEN 5-325 MG PO TABS: 1.0000 | ORAL_TABLET | Freq: Four times a day (QID) | ORAL | 0 refills | Status: DC | PRN

## 2018-12-24 MED ORDER — ONDANSETRON HCL 4 MG PO TABS: 4.0000 mg | ORAL_TABLET | Freq: Four times a day (QID) | ORAL | Status: DC | PRN

## 2018-12-24 MED ORDER — ONDANSETRON HCL 4 MG/2ML IJ SOLN: 4.0000 mg | Freq: Four times a day (QID) | INTRAMUSCULAR | Status: DC | PRN

## 2018-12-24 MED ORDER — METOCLOPRAMIDE HCL 5 MG/ML IJ SOLN: 5.0000 mg | Freq: Three times a day (TID) | INTRAMUSCULAR | Status: DC | PRN

## 2018-12-24 MED ORDER — CHLORHEXIDINE GLUCONATE 4 % EX LIQD: 60.0000 mL | Freq: Once | CUTANEOUS | Status: DC

## 2018-12-24 MED ORDER — IOHEXOL 180 MG/ML  SOLN
INTRAMUSCULAR | Status: DC | PRN
  Administered 2018-12-24: 40 mL

## 2018-12-24 MED ORDER — SODIUM CHLORIDE 0.9 % IV SOLN: INTRAVENOUS | Status: DC

## 2018-12-24 MED ORDER — PHENYLEPHRINE HCL (PRESSORS) 10 MG/ML IV SOLN
INTRAVENOUS | Status: AC
  Filled 2018-12-24: qty 1

## 2018-12-24 MED ORDER — MIDAZOLAM HCL 2 MG/2ML IJ SOLN
INTRAMUSCULAR | Status: DC | PRN
  Administered 2018-12-24 (×2): 1 mg via INTRAVENOUS

## 2018-12-24 MED ORDER — LACTATED RINGERS IV SOLN: INTRAVENOUS | Status: DC | PRN

## 2018-12-24 MED ORDER — FENTANYL CITRATE (PF) 100 MCG/2ML IJ SOLN
INTRAMUSCULAR | Status: DC | PRN
  Administered 2018-12-24 (×2): 50 ug via INTRAVENOUS

## 2018-12-24 MED ORDER — FENTANYL CITRATE (PF) 100 MCG/2ML IJ SOLN
INTRAMUSCULAR | Status: AC
  Administered 2018-12-24: 25 ug via INTRAVENOUS
  Filled 2018-12-24: qty 2

## 2018-12-24 SURGICAL SUPPLY — 21 items
ADH SKN CLS APL DERMABOND .7 (GAUZE/BANDAGES/DRESSINGS) ×1
CEMENT KYPHON CX01A KIT/MIXER (Cement) ×3 IMPLANT
COVER WAND RF STERILE (DRAPES) ×3 IMPLANT
DERMABOND ADVANCED (GAUZE/BANDAGES/DRESSINGS) ×2
DERMABOND ADVANCED .7 DNX12 (GAUZE/BANDAGES/DRESSINGS) ×1 IMPLANT
DEVICE BIOPSY BONE KYPHX (INSTRUMENTS) ×3 IMPLANT
DRAPE C-ARM XRAY 36X54 (DRAPES) ×3 IMPLANT
DURAPREP 26ML APPLICATOR (WOUND CARE) ×3 IMPLANT
FEE RENTAL RFA GENERATOR (MISCELLANEOUS) IMPLANT
GLOVE SURG SYN 9.0  PF PI (GLOVE) ×2
GLOVE SURG SYN 9.0 PF PI (GLOVE) ×1 IMPLANT
GOWN SRG 2XL LVL 4 RGLN SLV (GOWNS) ×1 IMPLANT
GOWN STRL NON-REIN 2XL LVL4 (GOWNS) ×6
GOWN STRL REUS W/ TWL LRG LVL3 (GOWN DISPOSABLE) ×1 IMPLANT
GOWN STRL REUS W/TWL LRG LVL3 (GOWN DISPOSABLE) ×3
PACK KYPHOPLASTY (MISCELLANEOUS) ×3 IMPLANT
RENTAL RFA  GENERATOR (MISCELLANEOUS)
RENTAL RFA GENERATOR (MISCELLANEOUS) IMPLANT
STRAP SAFETY 5IN WIDE (MISCELLANEOUS) ×3 IMPLANT
TRAY KYPHOPAK 15/3 EXPRESS 1ST (MISCELLANEOUS) ×1 IMPLANT
TRAY KYPHOPAK 20/3 EXPRESS 1ST (MISCELLANEOUS) ×3 IMPLANT

## 2018-12-24 NOTE — OR Nursing (Signed)
Per  Dr. Rosey Bath no new orders for am pre-op at this time

## 2018-12-24 NOTE — H&P (Signed)
Date 12/24/2018  Time 4:18pm  PATIENT: Arthur [name]  PRE-OPERATIVE DIAGNOSIS: closed wedge compression fracture of L3  POST-OPERATIVE DIAGNOSIS: closed wedge compression fracture of L3  PROCEDURE:Procedure(s): KYPHOPLASTY L3  SURGEON: Sven Pinheiro J Archibald Marchetta, MD  ASSISTANTS:None  ANESTHESIA:local and MAC  EBL:No intake/output data recorded.  BLOOD ADMINISTERED:none  DRAINS:none  LOCAL MEDICATIONS USED:MARCAINE and XYLOCAINE   SPECIMEN: L3 vertebral body biopsy  DISPOSITION OF SPECIMEN: Pathology  COUNTS:YES  TOURNIQUET:* No tourniquets in log *  IMPLANTS:Bone cement  DICTATION: .Dragon Dictationpatient was brought to the operating room and after adequate anesthesia was obtained the patient was placed prone. C arm was brought in in good visualization of the affected level obtained on both AP and lateral projections. After patient identification and timeout procedures were completed, local anesthetic was infiltrated with 10 cc 1% Xylocaine infiltrated subcutaneously. This is done the area on the each side of the planned approach. The back was then prepped and draped in the usual sterile manner and repeat timeout procedure carried out. A spinal needle was brought down to the pedicle on the each side of L3 and a 50-50 mix of 1% Xylocaine half percent Sensorcaine with epinephrine total of 20 cc injected. After allowing this to set a small incision was made and the trocar was advanced into the vertebral body in an extrapedicular fashion on each side. Biopsy was obtainedDrilling was carried out balloon inserted with inflation to 3 cc on each side.. When the cement was appropriate consistency a total of 9cc were injected into the vertebral body without extravasation, good fill superior to inferior endplates and from right to left sides along the inferior endplate. After the cement had set the trochar was removed and permanent C-arm views  obtained. The wound was closed with Dermabond followed by Band-Aid  PLAN OF CARE:Discharge to home after PACU  PATIENT DISPOSITION:PACU - hemodynamically stable.     

## 2018-12-24 NOTE — Discharge Instructions (Signed)
Take it easy today and tomorrow.  Thursday take off Band-Aid then okay to shower.  Try not to lift anything over 5 pounds for the next 2 weeks.  Call office if you are having increased pain.  Pain medicine as directed.

## 2018-12-24 NOTE — Anesthesia Procedure Notes (Signed)
Date/Time: 12/24/2018 3:56 PM Performed by: Nelda Marseille, CRNA Pre-anesthesia Checklist: Patient identified, Emergency Drugs available, Suction available, Patient being monitored and Timeout performed Oxygen Delivery Method: Nasal cannula

## 2018-12-24 NOTE — OR Nursing (Signed)
This RN educated pt on use of incentive spirometer and importance, post surgery. Pt returned demonstration through teach back.

## 2018-12-24 NOTE — OR Nursing (Signed)
Dr. Rosey Bath reviewed previous lab results and ekg, no new orders given at this time for pre-op

## 2018-12-24 NOTE — OR Nursing (Signed)
Pt incontinent multiple times in pre-op , pt gown changed and linen changed multiple times. PT is clean and dry

## 2018-12-24 NOTE — Op Note (Signed)
Date 12/24/2018  Time 4:18pm  PATIENT: Arthur [name]  PRE-OPERATIVE DIAGNOSIS: closed wedge compression fracture of L3  POST-OPERATIVE DIAGNOSIS: closed wedge compression fracture of L3  PROCEDURE:Procedure(s): KYPHOPLASTY L3  SURGEON: Laurene Footman, MD  ASSISTANTS:None  ANESTHESIA:local and MAC  EBL:No intake/output data recorded.  BLOOD ADMINISTERED:none  DRAINS:none  LOCAL MEDICATIONS USED:MARCAINE and XYLOCAINE   SPECIMEN: L3 vertebral body biopsy  DISPOSITION OF SPECIMEN: Pathology  COUNTS:YES  TOURNIQUET:* No tourniquets in log *  IMPLANTS:Bone cement  DICTATION: .Dragon Dictationpatient was brought to the operating room and after adequate anesthesia was obtained the patient was placed prone. C arm was brought in in good visualization of the affected level obtained on both AP and lateral projections. After patient identification and timeout procedures were completed, local anesthetic was infiltrated with 10 cc 1% Xylocaine infiltrated subcutaneously. This is done the area on the each side of the planned approach. The back was then prepped and draped in the usual sterile manner and repeat timeout procedure carried out. A spinal needle was brought down to the pedicle on the each side of L3 and a 50-50 mix of 1% Xylocaine half percent Sensorcaine with epinephrine total of 20 cc injected. After allowing this to set a small incision was made and the trocar was advanced into the vertebral body in an extrapedicular fashion on each side. Biopsy was obtainedDrilling was carried out balloon inserted with inflation to 3 cc on each side.. When the cement was appropriate consistency a total of 9cc were injected into the vertebral body without extravasation, good fill superior to inferior endplates and from right to left sides along the inferior endplate. After the cement had set the trochar was removed and permanent C-arm views  obtained. The wound was closed with Dermabond followed by Band-Aid  PLAN OF CARE:Discharge to home after PACU  PATIENT DISPOSITION:PACU - hemodynamically stable.

## 2018-12-24 NOTE — Anesthesia Preprocedure Evaluation (Signed)
Anesthesia Evaluation  Patient identified by MRN, date of birth, ID band Patient awake    Reviewed: Allergy & Precautions, H&P , NPO status , Patient's Chart, lab work & pertinent test results, reviewed documented beta blocker date and time   History of Anesthesia Complications Negative for: history of anesthetic complications  Airway Mallampati: III  TM Distance: >3 FB Neck ROM: full    Dental  (+) Edentulous Upper, Edentulous Lower   Pulmonary neg sleep apnea, COPD (uses O2 PRN),  oxygen dependent, former smoker,           Cardiovascular hypertension, Pt. on medications +CHF  (-) Past MI (-) dysrhythmias (-) Valvular Problems/Murmurs     Neuro/Psych neg Seizures    GI/Hepatic Neg liver ROS, neg GERD  ,  Endo/Other  diabetes, Type 2, Oral Hypoglycemic Agents  Renal/GU Renal InsufficiencyRenal disease     Musculoskeletal   Abdominal   Peds  Hematology  (+) Blood dyscrasia, anemia , GI Bleed   Anesthesia Other Findings      Reproductive/Obstetrics                             Anesthesia Physical  Anesthesia Plan  ASA: III  Anesthesia Plan: General   Post-op Pain Management:    Induction: Intravenous  PONV Risk Score and Plan: 2 and TIVA and Propofol infusion  Airway Management Planned: Nasal Cannula  Additional Equipment:   Intra-op Plan:   Post-operative Plan:   Informed Consent: I have reviewed the patients History and Physical, chart, labs and discussed the procedure including the risks, benefits and alternatives for the proposed anesthesia with the patient or authorized representative who has indicated his/her understanding and acceptance.       Plan Discussed with:   Anesthesia Plan Comments:         Anesthesia Quick Evaluation

## 2018-12-24 NOTE — H&P (Signed)
Reviewed paper H+P, will be scanned into chart. No changes noted.  

## 2018-12-25 DIAGNOSIS — N3000 Acute cystitis without hematuria: Secondary | ICD-10-CM | POA: Diagnosis not present

## 2018-12-25 DIAGNOSIS — J9611 Chronic respiratory failure with hypoxia: Secondary | ICD-10-CM | POA: Diagnosis not present

## 2018-12-25 DIAGNOSIS — I130 Hypertensive heart and chronic kidney disease with heart failure and stage 1 through stage 4 chronic kidney disease, or unspecified chronic kidney disease: Secondary | ICD-10-CM | POA: Diagnosis not present

## 2018-12-25 DIAGNOSIS — J181 Lobar pneumonia, unspecified organism: Secondary | ICD-10-CM | POA: Diagnosis not present

## 2018-12-25 DIAGNOSIS — E1122 Type 2 diabetes mellitus with diabetic chronic kidney disease: Secondary | ICD-10-CM | POA: Diagnosis not present

## 2018-12-25 DIAGNOSIS — I509 Heart failure, unspecified: Secondary | ICD-10-CM | POA: Diagnosis not present

## 2018-12-25 DIAGNOSIS — N183 Chronic kidney disease, stage 3 unspecified: Secondary | ICD-10-CM | POA: Diagnosis not present

## 2018-12-25 DIAGNOSIS — S32020D Wedge compression fracture of second lumbar vertebra, subsequent encounter for fracture with routine healing: Secondary | ICD-10-CM | POA: Diagnosis not present

## 2018-12-25 DIAGNOSIS — J440 Chronic obstructive pulmonary disease with acute lower respiratory infection: Secondary | ICD-10-CM | POA: Diagnosis not present

## 2018-12-25 NOTE — Anesthesia Post-op Follow-up Note (Signed)
Anesthesia QCDR form completed.        

## 2018-12-25 NOTE — Transfer of Care (Signed)
Immediate Anesthesia Transfer of Care Note  Patient: Arthur [name]  Procedure(s) Performed: KYPHOPLASTY L3 (N/A Spine Lumbar)  Patient Location: PACU  Anesthesia Type:General  Level of Consciousness: awake, alert  and oriented  Airway & Oxygen Therapy: Patient Spontanous Breathing and Patient connected to nasal cannula oxygen  Post-op Assessment: Report given to RN and Post -op Vital signs reviewed and stable  Post vital signs: Reviewed and stable  Last Vitals:  Vitals Value Taken Time  BP 148/49 12/24/18 1727  Temp 36.6 C 12/24/18 1727  Pulse 89 12/24/18 1727  Resp 18 12/24/18 1727  SpO2 91 % 12/24/18 1727    Last Pain:  Vitals:   12/24/18 1800  TempSrc:   PainSc: 4          Complications: No apparent anesthesia complications

## 2018-12-26 LAB — SURGICAL PATHOLOGY

## 2018-12-28 DIAGNOSIS — J441 Chronic obstructive pulmonary disease with (acute) exacerbation: Secondary | ICD-10-CM | POA: Diagnosis not present

## 2018-12-28 NOTE — Anesthesia Postprocedure Evaluation (Signed)
Anesthesia Post Note  Patient: Arthur [name]  Procedure(s) Performed: KYPHOPLASTY L3 (N/A Spine Lumbar)  Patient location during evaluation: PACU Anesthesia Type: General Level of consciousness: awake and alert Pain management: pain level controlled Vital Signs Assessment: post-procedure vital signs reviewed and stable Respiratory status: spontaneous breathing, nonlabored ventilation, respiratory function stable and patient connected to nasal cannula oxygen Cardiovascular status: blood pressure returned to baseline and stable Postop Assessment: no apparent nausea or vomiting Anesthetic complications: no     Last Vitals:  Vitals:   12/24/18 1727 12/24/18 1800  BP: (!) 148/49   Pulse: 89   Resp: 18   Temp: 36.6 C   SpO2: 91% 92%    Last Pain:  Vitals:   12/25/18 0838  TempSrc:   PainSc: 3                  Martha Clan

## 2018-12-30 DIAGNOSIS — M545 Low back pain: Secondary | ICD-10-CM | POA: Diagnosis not present

## 2019-01-01 ENCOUNTER — Other Ambulatory Visit: Payer: Self-pay

## 2019-01-01 ENCOUNTER — Inpatient Hospital Stay: Payer: Medicare HMO

## 2019-01-01 DIAGNOSIS — I129 Hypertensive chronic kidney disease with stage 1 through stage 4 chronic kidney disease, or unspecified chronic kidney disease: Secondary | ICD-10-CM | POA: Diagnosis not present

## 2019-01-01 DIAGNOSIS — I509 Heart failure, unspecified: Secondary | ICD-10-CM | POA: Diagnosis not present

## 2019-01-01 DIAGNOSIS — N183 Chronic kidney disease, stage 3 unspecified: Secondary | ICD-10-CM | POA: Diagnosis not present

## 2019-01-01 DIAGNOSIS — N189 Chronic kidney disease, unspecified: Secondary | ICD-10-CM

## 2019-01-01 DIAGNOSIS — I10 Essential (primary) hypertension: Secondary | ICD-10-CM | POA: Diagnosis not present

## 2019-01-01 DIAGNOSIS — E119 Type 2 diabetes mellitus without complications: Secondary | ICD-10-CM | POA: Diagnosis not present

## 2019-01-01 DIAGNOSIS — D631 Anemia in chronic kidney disease: Secondary | ICD-10-CM | POA: Diagnosis not present

## 2019-01-01 DIAGNOSIS — E785 Hyperlipidemia, unspecified: Secondary | ICD-10-CM | POA: Diagnosis not present

## 2019-01-01 DIAGNOSIS — J449 Chronic obstructive pulmonary disease, unspecified: Secondary | ICD-10-CM | POA: Diagnosis not present

## 2019-01-01 DIAGNOSIS — Z79899 Other long term (current) drug therapy: Secondary | ICD-10-CM | POA: Diagnosis not present

## 2019-01-01 LAB — CBC WITH DIFFERENTIAL/PLATELET
Abs Immature Granulocytes: 0.06 10*3/uL (ref 0.00–0.07)
Basophils Absolute: 0.1 10*3/uL (ref 0.0–0.1)
Basophils Relative: 1 %
Eosinophils Absolute: 0.2 10*3/uL (ref 0.0–0.5)
Eosinophils Relative: 3 %
HCT: 25.3 % — ABNORMAL LOW (ref 39.0–52.0)
Hemoglobin: 7.5 g/dL — ABNORMAL LOW (ref 13.0–17.0)
Immature Granulocytes: 1 %
Lymphocytes Relative: 17 %
Lymphs Abs: 1.2 10*3/uL (ref 0.7–4.0)
MCH: 29.5 pg (ref 26.0–34.0)
MCHC: 29.6 g/dL — ABNORMAL LOW (ref 30.0–36.0)
MCV: 99.6 fL (ref 80.0–100.0)
Monocytes Absolute: 0.8 10*3/uL (ref 0.1–1.0)
Monocytes Relative: 11 %
Neutro Abs: 4.6 10*3/uL (ref 1.7–7.7)
Neutrophils Relative %: 67 %
Platelets: 333 10*3/uL (ref 150–400)
RBC: 2.54 MIL/uL — ABNORMAL LOW (ref 4.22–5.81)
RDW: 19.7 % — ABNORMAL HIGH (ref 11.5–15.5)
WBC: 6.8 10*3/uL (ref 4.0–10.5)
nRBC: 0.4 % — ABNORMAL HIGH (ref 0.0–0.2)

## 2019-01-01 LAB — VITAMIN B12: Vitamin B-12: 390 pg/mL (ref 180–914)

## 2019-01-01 LAB — SAMPLE TO BLOOD BANK

## 2019-01-01 LAB — FOLATE: Folate: 6 ng/mL (ref >5.9)

## 2019-01-01 LAB — IRON AND TIBC
Iron: 47 ug/dL (ref 45–182)
Saturation Ratios: 27 % (ref 17.9–39.5)
TIBC: 175 ug/dL — ABNORMAL LOW (ref 250–450)
UIBC: 128 ug/dL

## 2019-01-01 LAB — FERRITIN: Ferritin: 410 ng/mL — ABNORMAL HIGH (ref 24–336)

## 2019-01-03 DIAGNOSIS — J441 Chronic obstructive pulmonary disease with (acute) exacerbation: Secondary | ICD-10-CM | POA: Diagnosis not present

## 2019-01-06 ENCOUNTER — Other Ambulatory Visit: Payer: Self-pay

## 2019-01-06 ENCOUNTER — Encounter: Payer: Self-pay | Admitting: Nurse Practitioner

## 2019-01-06 ENCOUNTER — Other Ambulatory Visit: Payer: Medicare HMO | Admitting: Nurse Practitioner

## 2019-01-06 DIAGNOSIS — I509 Heart failure, unspecified: Secondary | ICD-10-CM | POA: Diagnosis not present

## 2019-01-06 DIAGNOSIS — Z515 Encounter for palliative care: Secondary | ICD-10-CM | POA: Diagnosis not present

## 2019-01-06 NOTE — Progress Notes (Signed)
[place] Consult Note Telephone: [phone]  Fax: [phone]  PATIENT NAME: Arthur [name] DOB: 1933-12-16 MRN: [id number]  PRIMARY CARE PROVIDER:   Baxter Hire, MD  REFERRING PROVIDER:  Baxter Hire, MD [place],  [place] [address] RESPONSIBLE PARTY:Arthur [name] Mock daughter [phone]  Due to the COVID-19 crisis, this visit was done via telemedicine from my office and it was initiated and consent by this patient and or family.  RECOMMENDATIONS and PLAN: 1.ACP: DNR MOST form to be discussed at next [facility] visit.   2.Dyspneic secondary toCOPD/CHF;remain stable at present time. ContinueO2, inhalers, inhalation therapy as needed.   3. Pain, continue with hydrocodone, add senokot for constipation. Discussed pain management, regimen.   4.Palliative care encounter Palliative medicine team will continue to support patient, patient's family, and medical team. Visit consisted of counseling and education dealing with the complex and emotionally intense issues of symptom management and palliative care in the setting of serious and potentially life-threatening illness   I spent 40 minutes providing this consultation,  from 4:15pm to 5:00pm. More than 50% of the time in this consultation was spent coordinating communication.   HISTORY OF PRESENT ILLNESS:  Arthur [name] is a 86 y.o. year old male with multiple medical problems including COPD, congestive heart failure, anemia of chronic disease, chronic kidney disease stage 3, diabetes, hypertension, hyperlipidemia, history of GI bleed, allergies, tobacco use, obesity. Palliative care visit for Arthur [name]. Last palliative care visit 10 / 22 / 2020 since then Arthur. [name] had a hospitalization for pyelonephritis 10 / 25 / 2020 to 11 / 3 / 2020 sepsis secondary to UTI, closed compression fracture second lumbar vertebra, chronic kidney disease stage 3 secondary to diabetes,  chronic respiratory failure with hypoxia. Arthur. [name] was discharged to short-term rehab. Arthur. [name] also underwent kyphoplasty 10 / 29 / 2020 L2. Arthur. [name] was diagnosed with acute cystitis 11 / 4 / 2020 for which he took Cipro for. He discharged home. Arthur. [name] was seen by Dr Tasia Catchings 11 / 17 / 2020 for anemia of chronic disease and chronic kidney disease stage 4 with GFR 15 to 29. It was recommended to continue erythropoietin therapy, he did receive an infusion at this visit. Also recommended transfusions if hemoglobin declines. Arthur [name] was seen by Dr Candiss Norse [facility] 12 / 1 / 2020 for chronic kidney disease stage 4 her which torsemide was continued to three times a week. He was seen by dr. Edwina Barth for Primary Care 12 / 15 / 2020 lower back pain and x-rays recommended. Diabetes check hemoglobin A1c, COPD with no recent exacerbations. Hypertension slightly elevated, in light of increase in pain. Arthur [name] did have a follow-up visit with [name] on 12 / 16 / 2020 what's hemoglobin 8, received retacrit. He had a fall with hospitalization 12 / 22 / 2020, kyphoplasty L-3. He has since had a follow-up fall with increasing back pain, seen by Dr Rudene Christians Orthopedic. Plain xrays done, no notes fracture though increase pain in region. Dr Rudene Christians ordered an MRI for which he went to today. He was unable to complete the MRI in the mobile do you not due to inability to lay flat. Arthur [name] has a follow-up appointment with Orthopedics in 2 days. I called Jeanie Sewer daughter for scheduled palliative care telemedicine telephonic visit as video not available. [name] and I talked about what has transpired since the last palliative care visit. We talked about chronic  disease progression. We talked about multiple hospitalization. We talked about most recent fall and current situation of increasing back pain. Arthur. Placencia does have hydrocodone order though he does not take it due to concern for constipation. [name] endorses that they have been  using Colace to try to help but he has not had pain medication since he was in rehab. We talked about option of using senokot-s over the counter in addition to taking his hydrocodone when he has more severe pain. Please send or says that at present time he appears to be comfortable but she will get some said he changed his mind. Encouraged Arthur. Zingale to take the hydrocodone which will help with pain relief. We talked about his functional abilities. Arthur Arthur [name] is unable to lie flat and has been sleeping in a recliner for years. We talked about Arthur [name]'s concerns for unable to obtain MRI and concern for more fractures. Home health is currently on hold until MRI results are completed. [name] endorses she will contact ackerman's office tomorrow to see if she could reschedule MRI. We talked about his appetite which is good. We talked about family staying with Arthur [name] and not leaving him unattended. We talked about the importance of him having somebody with him as he is a high fall risk. [name] endorses that it is becoming more cumbersome with family members as all have jobs having to try to work and balance. We talked about option of palliative care social worker to look into Insurance to see if any further resources are available. Arthur [name] in agreement. Will do referral for palliative social worker. [name] endorses is on the list for Meals on Wheels. We talked about medical goals of care. We talked about role of palliative care and plan of care. We talked about follow-up visit in one week to further assist with symptom management, follow-up results, Dr Rudene Christians recommendations. Arthur [name] in agreement, appointment schedule. Therapeutic listening and emotional support provided. Questions answered to satisfaction. Contact information.  Palliative Care was asked to help to continue to address goals of care.   CODE STATUS: DNR  PPS: 40% HOSPICE ELIGIBILITY/DIAGNOSIS: TBD  PAST MEDICAL HISTORY:  Past Medical History:  Diagnosis Date  .  Anemia of chronic kidney failure 08/21/2018  . CHF (congestive heart failure) ([place])   . CKD (chronic kidney disease), stage III   . COPD (chronic obstructive pulmonary disease) ([place])   . Diabetes mellitus without complication ([place])   . Hyperlipidemia   . Hypertension     SOCIAL HX:  Social History   Tobacco Use  . Smoking status: Former Smoker    Packs/day: 2.00    Years: 35.00    Pack years: 70.00    Types: Cigarettes    Quit date: 11/04/1982    Years since quitting: 36.1  . Smokeless tobacco: Current User  Substance Use Topics  . Alcohol use: Not Currently    ALLERGIES:  Allergies  Allergen Reactions  . Codeine Other (See Comments)    Constipation     PERTINENT MEDICATIONS:  Outpatient Encounter Medications as of 01/06/2019  Medication Sig  . albuterol (VENTOLIN HFA) 108 (90 Base) MCG/ACT inhaler Inhale 2 puffs into the lungs every 6 (six) hours as needed for wheezing or shortness of breath.  Marland Kitchen amLODipine (NORVASC) 2.5 MG tablet Take 2.5 mg by mouth daily.  Marland Kitchen docusate sodium (COLACE) 100 MG capsule Take 1 capsule (100 mg total) by mouth 2 (two) times daily. (Patient taking differently: Take 200 mg by mouth daily as  needed for mild constipation. )  . HYDROcodone-acetaminophen (NORCO) 5-325 MG tablet Take 1 tablet by mouth every 6 (six) hours as needed.  Marland Kitchen HYDROcodone-acetaminophen (NORCO/VICODIN) 5-325 MG tablet Take 1 tablet by mouth every 12 (twelve) hours as needed for moderate pain.   Marland Kitchen insulin aspart (NOVOLOG) 100 UNIT/ML injection Inject 5 Units into the skin 3 (three) times daily with meals. (Patient taking differently: Inject 7-15 Units into the skin 3 (three) times daily with meals. Skip dose if blood sugar is under 100)  . ipratropium-albuterol (DUONEB) 0.5-2.5 (3) MG/3ML SOLN Take 3 mLs by nebulization every 6 (six) hours as needed (shortness of breath).  . iron polysaccharides (NIFEREX) 150 MG capsule Take 1 capsule (150 mg total) by mouth daily. (Patient taking  differently: Take 150 mg by mouth every evening. )  . LEVEMIR 100 UNIT/ML injection Inject 0.12 mLs (12 Units total) into the skin daily. (Patient taking differently: Inject 30 Units into the skin at bedtime. )  . pantoprazole (PROTONIX) 40 MG tablet Take 1 tablet (40 mg total) by mouth 2 (two) times daily.  . potassium chloride (KLOR-CON) 10 MEQ tablet Take 10 mEq by mouth every evening.  . pravastatin (PRAVACHOL) 10 MG tablet Take 10 mg by mouth at bedtime.  . tamsulosin (FLOMAX) 0.4 MG CAPS capsule Take 0.4 mg by mouth daily.  Marland Kitchen torsemide (DEMADEX) 20 MG tablet Take 20 mg by mouth every Monday, Wednesday, and Friday.   No facility-administered encounter medications on file as of 01/06/2019.    PHYSICAL EXAM:  Deferred  Priscillia Fouch Z Laquinta Hazell, NP

## 2019-01-08 ENCOUNTER — Other Ambulatory Visit: Payer: Self-pay | Admitting: Orthopedic Surgery

## 2019-01-08 ENCOUNTER — Other Ambulatory Visit: Payer: Medicare HMO

## 2019-01-08 DIAGNOSIS — Z9889 Other specified postprocedural states: Secondary | ICD-10-CM | POA: Diagnosis not present

## 2019-01-08 DIAGNOSIS — M545 Low back pain, unspecified: Secondary | ICD-10-CM

## 2019-01-08 DIAGNOSIS — S32050A Wedge compression fracture of fifth lumbar vertebra, initial encounter for closed fracture: Secondary | ICD-10-CM | POA: Diagnosis not present

## 2019-01-08 DIAGNOSIS — S32040A Wedge compression fracture of fourth lumbar vertebra, initial encounter for closed fracture: Secondary | ICD-10-CM | POA: Diagnosis not present

## 2019-01-09 ENCOUNTER — Other Ambulatory Visit: Payer: Self-pay

## 2019-01-09 ENCOUNTER — Ambulatory Visit
Admission: RE | Admit: 2019-01-09 | Discharge: 2019-01-09 | Disposition: A | Discharge status: Home / Self Care | Payer: Medicare HMO | Source: Ambulatory Visit | Attending: Orthopedic Surgery | Admitting: Orthopedic Surgery

## 2019-01-09 ENCOUNTER — Encounter: Payer: Self-pay | Admitting: Radiology

## 2019-01-09 ENCOUNTER — Other Ambulatory Visit (HOSPITAL_COMMUNITY): Payer: Self-pay | Admitting: Orthopedic Surgery

## 2019-01-09 DIAGNOSIS — M545 Low back pain, unspecified: Secondary | ICD-10-CM

## 2019-01-10 ENCOUNTER — Telehealth: Payer: Self-pay

## 2019-01-10 NOTE — Telephone Encounter (Addendum)
SW received referral from [place], NP to follow-up with [name] (patient's daughter) regarding caregiver support. [name] provided brief history and overview. Patient is needing more care at home. [name] said her sister-in-law and she are rotating nights staying with patient. [name] noted that patient may have a possible fracture in his back again but has to wait to have a MRI at [facility] on 1/26. Due to positioning and pain concerns, patient is scheduled to be sedated for the MRI. [name] said that patient has limited movement due to pain. Patient is weaker overall. Patient wants to remain at home but there are safety concerns. [name] said patient would like to improve and is hopeful that following MRI, they will have a plan for treatment. [name] noted limited social support and financial resources. Discussed in-home care options with private caregivers or agencies. Discussed facility care options. [name] does not feel that patient will be agreeable to facility care and is concerned with COVID restrictions. [name] noted that she is having increased anxiety herself in the caregiving role. SW encouraged [name] to talk with her PCP and employee assistance counseling with her employer. [name] agreed. SW also encouraged [name] to call palliative care team back with any changes, questions or concerns. [name] was appreciative of phone call and support.

## 2019-01-13 DIAGNOSIS — E1122 Type 2 diabetes mellitus with diabetic chronic kidney disease: Secondary | ICD-10-CM | POA: Diagnosis not present

## 2019-01-13 DIAGNOSIS — I509 Heart failure, unspecified: Secondary | ICD-10-CM | POA: Diagnosis not present

## 2019-01-13 DIAGNOSIS — J440 Chronic obstructive pulmonary disease with acute lower respiratory infection: Secondary | ICD-10-CM | POA: Diagnosis not present

## 2019-01-13 DIAGNOSIS — N3000 Acute cystitis without hematuria: Secondary | ICD-10-CM | POA: Diagnosis not present

## 2019-01-13 DIAGNOSIS — J9611 Chronic respiratory failure with hypoxia: Secondary | ICD-10-CM | POA: Diagnosis not present

## 2019-01-13 DIAGNOSIS — N183 Chronic kidney disease, stage 3 unspecified: Secondary | ICD-10-CM | POA: Diagnosis not present

## 2019-01-13 DIAGNOSIS — J181 Lobar pneumonia, unspecified organism: Secondary | ICD-10-CM | POA: Diagnosis not present

## 2019-01-13 DIAGNOSIS — S32020D Wedge compression fracture of second lumbar vertebra, subsequent encounter for fracture with routine healing: Secondary | ICD-10-CM | POA: Diagnosis not present

## 2019-01-13 DIAGNOSIS — I130 Hypertensive heart and chronic kidney disease with heart failure and stage 1 through stage 4 chronic kidney disease, or unspecified chronic kidney disease: Secondary | ICD-10-CM | POA: Diagnosis not present

## 2019-01-14 ENCOUNTER — Other Ambulatory Visit: Payer: Medicare HMO | Admitting: Nurse Practitioner

## 2019-01-14 ENCOUNTER — Encounter: Payer: Self-pay | Admitting: Nurse Practitioner

## 2019-01-14 DIAGNOSIS — I509 Heart failure, unspecified: Secondary | ICD-10-CM

## 2019-01-14 DIAGNOSIS — Z515 Encounter for palliative care: Secondary | ICD-10-CM | POA: Diagnosis not present

## 2019-01-14 NOTE — Progress Notes (Signed)
[place] Consult Note Telephone: [phone]  Fax: [phone]  PATIENT NAME: Arthur [name] DOB: [birth year] MRN: [id number]  PRIMARY CARE PROVIDER:   Baxter Hire, MD  REFERRING PROVIDER:  Baxter Hire, MD [place],  [place]  RESPONSIBLE PARTY:Arthur [name] daughter [phone]  Due to the COVID-19 crisis, this visit was done via telemedicine from my office and it was initiated and consent by this patient and or family.  RECOMMENDATIONS and PLAN: 1.ACP: DNR MOST form to be discussed at next [facility] visit.   2.Dyspneic secondary toCOPD/CHF;remain stable at present time. ContinueO2, inhalers, inhalation therapy as needed.   3. Pain, continue with tramadol, continue senokot for constipation. Discussed pain management, regimen.   4.Palliative care encounter Palliative medicine team will continue to support patient, patient's family, and medical team. Visit consisted of counseling and education dealing with the complex and emotionally intense issues of symptom management and palliative care in the setting of serious and potentially life-threatening illness  I spent 65 minutes providing this consultation,  from 4:05pm to 5:10pm. More than 50% of the time in this consultation was spent coordinating communication.   HISTORY OF PRESENT ILLNESS:  Arthur [name] is a 86 y.o. year old male with multiple medical problems including COPD, congestive heart failure, anemia of chronic disease, chronic kidney disease stage 3, diabetes, hypertension, hyperlipidemia, history of GI bleed, allergies, tobacco use, obesity. Schedule palliative care follow-up visit telephonic, telemedicine is video not available with Arthur [name] and her father Arthur [name]. Arthur [name] ain agreement. We talked about how Arthur [name] has been feeling. Arthur [name] endorses that this morning early he had a blood sugar of 30. This is new for him. Last night Arthur.  [name] blood sugars were in the 200.  Arthur [name] endorses Arthur. [name] did not have a snack before bed but he did have a good dinner. This is the lowest Arthur. [name] been and she was very concerned. We talked about his diabetes. We talked about her regiment for his blood sugars and checking. We talked about that episode of hypoglycemia this morning she went ahead and gave him orange juice and a honey bun. We talked about the importance of protein and keeping blood sugars that. Arthur [name] endorses that she did have her grandson feed him egg for protein after the episode also. We talked about diabetes. We talked about upcoming MRI scan. Arthur [name] endorses they tried to do the MRI scan and gave him hydrocodone though he was not able to tolerate and they had to reschedule. Arthur [name] endorses that their plan is to do sedation for the MRI. Arthur [name] says continue to have pain in his lower back which has been making him less mobile. Physical Therapy came out in discussesd that they will return after MRI to see if he would be of benefit to continue therapy though at present time reproduces pain. We talked about upcoming MRI the end of the month. We talked about pain management with Tramadol. Arthur [name] endorses she is not using the hydrocodone but the Tramadol and it does really stomach pain. We send ourselves more difficulty with constipation. Arthur [name] endorses she has been giving him Senokot over the counter which has been effective and he did have a bowel movement today. We talks about importance of Mobility. Arthur [name] endorses she feels like Arthur [name] has given up. Arthur [name] endorses Arthur [name] has reported to her that he's tired of all of what's been going on. Arthur. [name]  is tired of the loss of [place], increase in pain, difficulty with constipation and now difficulty with blood sugar. We talked about following up. Dr. Wynetta Emery concerning the blood sugar. Also we talked about continuing pain management Spell regimen. We talked about medical goals of care with  aggressive versus conservative versus Comfort Care. Arthur's mom, Arthur. [name] wife was under hospice care. We talked about aggressive interventions versus comfort care. We talked about not having enough information with MRI attending to see if he has a fracture or how to better manage. Arthur [name] endorses it is important for her to have MRI results. We talked about option of focusing more on Comfort, quality of life. We did talk about Hospice Services for Arthur [name] though at present time with the MRI pending would be best to wait until after the MRI. The stats that will wait until MRI resulted and then further evaluate if Arthur [name] would be beneficial for a better plan would be to switch to Arthur [name] under Hospice Services. Arthur in agreement. We talked about role of palliative care and plan of care. Will follow up palliative care visit in one week. Discussed with Arthur [name] will keep close following with palliative with ongoing symptoms of pain, constipation and overall decline. Arthur [name] in agreement and scheduled appointment. Therapeutic listening and emotional support provided. Contact information provided. Questions answered to satisfaction. Actually send Dr. Wynetta Emery concerning the blood sugars and also will send a message as well to see if he which is further management  Palliative Care was asked to help to continue to  address goals of care.   CODE STATUS: DNR  PPS: 40% HOSPICE ELIGIBILITY/DIAGNOSIS: TBD  PAST MEDICAL HISTORY:  Past Medical History:  Diagnosis Date  . Anemia of chronic kidney failure 08/21/2018  . CHF (congestive heart failure) ([place])   . CKD (chronic kidney disease), stage III   . COPD (chronic obstructive pulmonary disease) ([place])   . Diabetes mellitus without complication ([place])   . Hyperlipidemia   . Hypertension     SOCIAL HX:  Social History   Tobacco Use  . Smoking status: Former Smoker    Packs/day: 2.00    Years: 35.00    Pack years: 70.00    Types: Cigarettes     Quit date: 11/04/1982    Years since quitting: 36.2  . Smokeless tobacco: Current User  Substance Use Topics  . Alcohol use: Not Currently    ALLERGIES:  Allergies  Allergen Reactions  . Codeine Other (See Comments)    Constipation     PERTINENT MEDICATIONS:  Outpatient Encounter Medications as of 01/14/2019  Medication Sig  . albuterol (VENTOLIN HFA) 108 (90 Base) MCG/ACT inhaler Inhale 2 puffs into the lungs every 6 (six) hours as needed for wheezing or shortness of breath.  Marland Kitchen amLODipine (NORVASC) 2.5 MG tablet Take 2.5 mg by mouth daily.  Marland Kitchen docusate sodium (COLACE) 100 MG capsule Take 1 capsule (100 mg total) by mouth 2 (two) times daily. (Patient taking differently: Take 200 mg by mouth daily as needed for mild constipation. )  . HYDROcodone-acetaminophen (NORCO) 5-325 MG tablet Take 1 tablet by mouth every 6 (six) hours as needed.  Marland Kitchen HYDROcodone-acetaminophen (NORCO/VICODIN) 5-325 MG tablet Take 1 tablet by mouth every 12 (twelve) hours as needed for moderate pain.   Marland Kitchen insulin aspart (NOVOLOG) 100 UNIT/ML injection Inject 5 Units into the skin 3 (three) times daily with meals. (Patient taking differently: Inject 7-15 Units into the skin 3 (three) times daily with  meals. Skip dose if blood sugar is under 100)  . ipratropium-albuterol (DUONEB) 0.5-2.5 (3) MG/3ML SOLN Take 3 mLs by nebulization every 6 (six) hours as needed (shortness of breath).  . iron polysaccharides (NIFEREX) 150 MG capsule Take 1 capsule (150 mg total) by mouth daily. (Patient taking differently: Take 150 mg by mouth every evening. )  . LEVEMIR 100 UNIT/ML injection Inject 0.12 mLs (12 Units total) into the skin daily. (Patient taking differently: Inject 30 Units into the skin at bedtime. )  . pantoprazole (PROTONIX) 40 MG tablet Take 1 tablet (40 mg total) by mouth 2 (two) times daily.  . potassium chloride (KLOR-CON) 10 MEQ tablet Take 10 mEq by mouth every evening.  . pravastatin (PRAVACHOL) 10 MG tablet Take 10 mg  by mouth at bedtime.  . tamsulosin (FLOMAX) 0.4 MG CAPS capsule Take 0.4 mg by mouth daily.  Marland Kitchen torsemide (DEMADEX) 20 MG tablet Take 20 mg by mouth every Monday, Wednesday, and Friday.   No facility-administered encounter medications on file as of 01/14/2019.    PHYSICAL EXAM:   Deferred  Ramie Erman Z Romell [place], NP

## 2019-01-14 NOTE — Progress Notes (Signed)
Called Arthur [name] left message to call back

## 2019-01-15 ENCOUNTER — Inpatient Hospital Stay: Payer: Medicare HMO

## 2019-01-15 ENCOUNTER — Inpatient Hospital Stay (HOSPITAL_BASED_OUTPATIENT_CLINIC_OR_DEPARTMENT_OTHER): Payer: Medicare HMO | Admitting: Oncology

## 2019-01-15 ENCOUNTER — Other Ambulatory Visit: Payer: Self-pay | Admitting: Oncology

## 2019-01-15 ENCOUNTER — Inpatient Hospital Stay: Payer: Medicare HMO | Attending: Oncology

## 2019-01-15 ENCOUNTER — Encounter: Payer: Self-pay | Admitting: Oncology

## 2019-01-15 ENCOUNTER — Other Ambulatory Visit: Payer: Self-pay

## 2019-01-15 VITALS — BP 150/65 | HR 73 | Temp 98.9°F | Resp 18 | Wt 208.0 lb

## 2019-01-15 DIAGNOSIS — D649 Anemia, unspecified: Secondary | ICD-10-CM

## 2019-01-15 DIAGNOSIS — Z79899 Other long term (current) drug therapy: Secondary | ICD-10-CM | POA: Insufficient documentation

## 2019-01-15 DIAGNOSIS — D631 Anemia in chronic kidney disease: Secondary | ICD-10-CM | POA: Insufficient documentation

## 2019-01-15 DIAGNOSIS — Z87891 Personal history of nicotine dependence: Secondary | ICD-10-CM | POA: Diagnosis not present

## 2019-01-15 DIAGNOSIS — E1122 Type 2 diabetes mellitus with diabetic chronic kidney disease: Secondary | ICD-10-CM | POA: Insufficient documentation

## 2019-01-15 DIAGNOSIS — I129 Hypertensive chronic kidney disease with stage 1 through stage 4 chronic kidney disease, or unspecified chronic kidney disease: Secondary | ICD-10-CM | POA: Diagnosis not present

## 2019-01-15 DIAGNOSIS — Z794 Long term (current) use of insulin: Secondary | ICD-10-CM | POA: Insufficient documentation

## 2019-01-15 DIAGNOSIS — N189 Chronic kidney disease, unspecified: Secondary | ICD-10-CM | POA: Diagnosis not present

## 2019-01-15 DIAGNOSIS — Z7189 Other specified counseling: Secondary | ICD-10-CM | POA: Diagnosis not present

## 2019-01-15 DIAGNOSIS — E785 Hyperlipidemia, unspecified: Secondary | ICD-10-CM | POA: Insufficient documentation

## 2019-01-15 DIAGNOSIS — Z9981 Dependence on supplemental oxygen: Secondary | ICD-10-CM | POA: Diagnosis not present

## 2019-01-15 DIAGNOSIS — N183 Chronic kidney disease, stage 3 unspecified: Secondary | ICD-10-CM | POA: Diagnosis not present

## 2019-01-15 DIAGNOSIS — I509 Heart failure, unspecified: Secondary | ICD-10-CM | POA: Insufficient documentation

## 2019-01-15 DIAGNOSIS — B351 Tinea unguium: Secondary | ICD-10-CM | POA: Diagnosis not present

## 2019-01-15 DIAGNOSIS — J439 Emphysema, unspecified: Secondary | ICD-10-CM | POA: Insufficient documentation

## 2019-01-15 DIAGNOSIS — E1142 Type 2 diabetes mellitus with diabetic polyneuropathy: Secondary | ICD-10-CM | POA: Diagnosis not present

## 2019-01-15 DIAGNOSIS — R531 Weakness: Secondary | ICD-10-CM | POA: Insufficient documentation

## 2019-01-15 DIAGNOSIS — I878 Other specified disorders of veins: Secondary | ICD-10-CM | POA: Diagnosis not present

## 2019-01-15 DIAGNOSIS — L851 Acquired keratosis [keratoderma] palmaris et plantaris: Secondary | ICD-10-CM | POA: Diagnosis not present

## 2019-01-15 DIAGNOSIS — J9610 Chronic respiratory failure, unspecified whether with hypoxia or hypercapnia: Secondary | ICD-10-CM | POA: Diagnosis not present

## 2019-01-15 LAB — CBC WITH DIFFERENTIAL/PLATELET
Abs Immature Granulocytes: 0.02 10*3/uL (ref 0.00–0.07)
Basophils Absolute: 0 10*3/uL (ref 0.0–0.1)
Basophils Relative: 1 %
Eosinophils Absolute: 0.4 10*3/uL (ref 0.0–0.5)
Eosinophils Relative: 7 %
HCT: 22.3 % — ABNORMAL LOW (ref 39.0–52.0)
Hemoglobin: 6.6 g/dL — ABNORMAL LOW (ref 13.0–17.0)
Immature Granulocytes: 0 %
Lymphocytes Relative: 27 %
Lymphs Abs: 1.6 10*3/uL (ref 0.7–4.0)
MCH: 29.3 pg (ref 26.0–34.0)
MCHC: 29.6 g/dL — ABNORMAL LOW (ref 30.0–36.0)
MCV: 99.1 fL (ref 80.0–100.0)
Monocytes Absolute: 0.7 10*3/uL (ref 0.1–1.0)
Monocytes Relative: 12 %
Neutro Abs: 3 10*3/uL (ref 1.7–7.7)
Neutrophils Relative %: 53 %
Platelets: 322 10*3/uL (ref 150–400)
RBC: 2.25 MIL/uL — ABNORMAL LOW (ref 4.22–5.81)
RDW: 19 % — ABNORMAL HIGH (ref 11.5–15.5)
WBC: 5.8 10*3/uL (ref 4.0–10.5)
nRBC: 0 % (ref 0.0–0.2)

## 2019-01-15 LAB — TECHNOLOGIST SMEAR REVIEW: Plt Morphology: ADEQUATE

## 2019-01-15 LAB — SAMPLE TO BLOOD BANK

## 2019-01-15 LAB — PREPARE RBC (CROSSMATCH)

## 2019-01-15 NOTE — Progress Notes (Signed)
Patient here for follow up.  Pt's daughter states that his sugar dropped to 33 yesterday morning, but was able to be brought up. I advised her to contact PCP. She states she will stop by PCP office today.

## 2019-01-16 ENCOUNTER — Other Ambulatory Visit: Payer: Self-pay

## 2019-01-16 ENCOUNTER — Inpatient Hospital Stay: Payer: Medicare HMO

## 2019-01-16 DIAGNOSIS — Z7189 Other specified counseling: Secondary | ICD-10-CM | POA: Insufficient documentation

## 2019-01-16 DIAGNOSIS — D649 Anemia, unspecified: Secondary | ICD-10-CM

## 2019-01-16 DIAGNOSIS — J9610 Chronic respiratory failure, unspecified whether with hypoxia or hypercapnia: Secondary | ICD-10-CM | POA: Diagnosis not present

## 2019-01-16 DIAGNOSIS — E785 Hyperlipidemia, unspecified: Secondary | ICD-10-CM | POA: Diagnosis not present

## 2019-01-16 DIAGNOSIS — N183 Chronic kidney disease, stage 3 unspecified: Secondary | ICD-10-CM | POA: Diagnosis not present

## 2019-01-16 DIAGNOSIS — J439 Emphysema, unspecified: Secondary | ICD-10-CM | POA: Diagnosis not present

## 2019-01-16 DIAGNOSIS — E1122 Type 2 diabetes mellitus with diabetic chronic kidney disease: Secondary | ICD-10-CM | POA: Diagnosis not present

## 2019-01-16 DIAGNOSIS — I509 Heart failure, unspecified: Secondary | ICD-10-CM | POA: Diagnosis not present

## 2019-01-16 DIAGNOSIS — I129 Hypertensive chronic kidney disease with stage 1 through stage 4 chronic kidney disease, or unspecified chronic kidney disease: Secondary | ICD-10-CM | POA: Diagnosis not present

## 2019-01-16 DIAGNOSIS — D631 Anemia in chronic kidney disease: Secondary | ICD-10-CM | POA: Diagnosis not present

## 2019-01-16 DIAGNOSIS — N184 Chronic kidney disease, stage 4 (severe): Secondary | ICD-10-CM | POA: Insufficient documentation

## 2019-01-16 DIAGNOSIS — Z79899 Other long term (current) drug therapy: Secondary | ICD-10-CM | POA: Diagnosis not present

## 2019-01-16 MED ORDER — DIPHENHYDRAMINE HCL 25 MG PO CAPS
25.0000 mg | ORAL_CAPSULE | Freq: Once | ORAL | Status: AC
  Administered 2019-01-16: 25 mg via ORAL
  Filled 2019-01-16: qty 1

## 2019-01-16 MED ORDER — FUROSEMIDE 10 MG/ML IJ SOLN
20.0000 mg | Freq: Once | INTRAMUSCULAR | Status: AC
  Administered 2019-01-16: 20 mg via INTRAVENOUS
  Filled 2019-01-16: qty 2

## 2019-01-16 MED ORDER — SODIUM CHLORIDE 0.9% IV SOLUTION
250.0000 mL | Freq: Once | INTRAVENOUS | Status: AC
  Administered 2019-01-16: 250 mL via INTRAVENOUS
  Filled 2019-01-16: qty 250

## 2019-01-16 MED ORDER — ACETAMINOPHEN 325 MG PO TABS
650.0000 mg | ORAL_TABLET | Freq: Once | ORAL | Status: AC
  Administered 2019-01-16: 650 mg via ORAL
  Filled 2019-01-16: qty 2

## 2019-01-16 NOTE — Progress Notes (Signed)
Hematology/Oncology  [facility] Telephone:([phone] [phone]   Patient Care Team: Baxter Hire, MD as PCP - General (Internal Medicine) Earlie Server, MD as Consulting Physician (Oncology)  REFERRING PROVIDER: Baxter Hire, MD  CHIEF COMPLAINTS/REASON FOR VISIT:  Follow-up for anemia secondary to chronic kidney disease.  HISTORY OF PRESENTING ILLNESS:   Arthur [name] is a  86 y.o.  male with PMH listed below was seen in consultation at the request of  Baxter Hire, MD  for evaluation of anemia Patient has chronic comorbidities including diabetes, COPD/emphysema, chronic respiratory failure on home oxygen, CKD stage III, CHF, hypertension and hyperlipidemia. Reviewed patient's previous lab records. Extensive medical records review was performed by me. Anemia has been chronic, dated back to at least 2016.  At that time his hemoglobin was around 11-12. Hemoglobin started to trended down below 10 starting February 2020. 02/11/2018 hemoglobin 9, hematocrit 28.6, 06/17/2018 hemoglobin 7.3 hematocrit of 24, 06/27/2018 hemoglobin 5.8 07/15/2018 hemoglobin 7.5 07/29/2018 hemoglobin 8.2, 08/12/2018, hemoglobin 9.2  Patient reports feeling tired and weak.  Chronic shortness of breath at baseline today.  Daughter [name] was called during the clinical encounter and she was able to listen to the discussion and participate as well.   INTERVAL HISTORY Arthur [name] is a 86 y.o. male who has above history reviewed by me today presents for follow up visit for management of anemia Problems and complaints are listed below: Patient was accompanied by her daughter [name] today. Patient says " I can be better".  Chronic fatigue, worsened.  He also has chronic cough.  Chronic respiratory failure on oxygen. Denies any blood in the stool.  Patient takes oral iron supplementation.  Stool is black after taking iron. Patient follows up with palliative care and was seen by  palliative home care visit on 01/14/2019.  Review of Systems  Constitutional: Positive for fatigue. Negative for appetite change, chills, fever and unexpected weight change.  HENT:   Negative for hearing loss and voice change.   Eyes: Negative for eye problems and icterus.  Respiratory: Positive for shortness of breath. Negative for chest tightness and cough.   Cardiovascular: Positive for leg swelling. Negative for chest pain.  Gastrointestinal: Negative for abdominal distention and abdominal pain.  Endocrine: Negative for hot flashes.  Genitourinary: Negative for difficulty urinating, dysuria and frequency.   Musculoskeletal: Positive for back pain. Negative for arthralgias.       Chronic back pain  Skin: Negative for itching and rash.  Neurological: Negative for light-headedness and numbness.  Hematological: Negative for adenopathy. Does not bruise/bleed easily.  Psychiatric/Behavioral: Negative for confusion.    MEDICAL HISTORY:  Past Medical History:  Diagnosis Date  . Anemia of chronic kidney failure 08/21/2018  . CHF (congestive heart failure) ([place])   . CKD (chronic kidney disease), stage III   . COPD (chronic obstructive pulmonary disease) ([place])   . Diabetes mellitus without complication ([place])   . Hyperlipidemia   . Hypertension     SURGICAL HISTORY: Past Surgical History:  Procedure Laterality Date  . COLONOSCOPY WITH PROPOFOL N/A 06/11/2018   Procedure: COLONOSCOPY WITH PROPOFOL;  Surgeon: Jonathon Bellows, MD;  Location: [facility] ENDOSCOPY;  Service: Gastroenterology;  Laterality: N/A;  . COLONOSCOPY WITH PROPOFOL N/A 06/13/2018   Procedure: COLONOSCOPY WITH PROPOFOL;  Surgeon: Jonathon Bellows, MD;  Location: [facility] ENDOSCOPY;  Service: Gastroenterology;  Laterality: N/A;  . ESOPHAGOGASTRODUODENOSCOPY Left 06/10/2018   Procedure: ESOPHAGOGASTRODUODENOSCOPY (EGD);  Surgeon: Jonathon Bellows, MD;  Location: [place];  Service: Gastroenterology;  Laterality: Left;  . GIVENS CAPSULE STUDY  N/A 06/28/2018   Procedure: GIVENS CAPSULE STUDY;  Surgeon: Lucilla Lame, MD;  Location: [facility] ENDOSCOPY;  Service: Endoscopy;  Laterality: N/A;  . GIVENS CAPSULE STUDY N/A 06/30/2018   Procedure: GIVENS CAPSULE STUDY;  Surgeon: Jonathon Bellows, MD;  Location: [facility] ENDOSCOPY;  Service: Gastroenterology;  Laterality: N/A;  . KYPHOPLASTY N/A 10/31/2018   Procedure: KYPHOPLASTY L2;  Surgeon: [name], MD;  Location: [facility] ORS;  Service: Orthopedics;  Laterality: N/A;  . KYPHOPLASTY N/A 12/24/2018   Procedure: KYPHOPLASTY L3;  Surgeon: [name], MD;  Location: [facility] ORS;  Service: Orthopedics;  Laterality: N/A;    SOCIAL HISTORY: Social History   Socioeconomic History  . Marital status: Widowed    Spouse name: Not on file  . Number of children: Not on file  . Years of education: Not on file  . Highest education level: Not on file  Occupational History  . Not on file  Tobacco Use  . Smoking status: Former Smoker    Packs/day: 2.00    Years: 35.00    Pack years: 70.00    Types: Cigarettes    Quit date: 11/04/1982    Years since quitting: 36.2  . Smokeless tobacco: Current User  Substance and Sexual Activity  . Alcohol use: Not Currently  . Drug use: No  . Sexual activity: Not Currently  Other Topics Concern  . Not on file  Social History Narrative  . Not on file   Social Determinants of Health   Financial Resource Strain: Low Risk   . Difficulty of Paying Living Expenses: Not hard at all  Food Insecurity: No Food Insecurity  . Worried About Charity fundraiser in the Last Year: Never true  . Ran Out of Food in the Last Year: Never true  Transportation Needs: No Transportation Needs  . Lack of Transportation (Medical): No  . Lack of Transportation (Non-Medical): No  Physical Activity: Unknown  . Days of Exercise per Week: 0 days  . Minutes of Exercise per Session: Not on file  Stress: No Stress Concern Present  . Feeling of Stress : Not at all  Social Connections:   .  Frequency of Communication with Friends and Family: Not on file  . Frequency of Social Gatherings with Friends and Family: Not on file  . Attends Religious Services: Not on file  . Active Member of Clubs or Organizations: Not on file  . Attends Archivist Meetings: Not on file  . Marital Status: Not on file  Intimate Partner Violence: Unknown  . Fear of Current or Ex-Partner: Patient refused  . Emotionally Abused: Patient refused  . Physically Abused: Patient refused  . Sexually Abused: Patient refused    FAMILY HISTORY: Family History  Problem Relation Age of Onset  . COPD Mother   . Cancer Father     ALLERGIES:  is allergic to codeine.  MEDICATIONS:  Current Outpatient Medications  Medication Sig Dispense Refill  . albuterol (VENTOLIN HFA) 108 (90 Base) MCG/ACT inhaler Inhale 2 puffs into the lungs every 6 (six) hours as needed for wheezing or shortness of breath. 1 Inhaler 2  . amLODipine (NORVASC) 2.5 MG tablet Take 2.5 mg by mouth daily.    . Ascorbic Acid (VITAMIN C PO) Take 500 mg by mouth daily.    Marland Kitchen docusate sodium (COLACE) 100 MG capsule Take 1 capsule (100 mg total) by mouth 2 (two) times daily. (Patient taking differently: Take 200 mg  by mouth daily as needed for mild constipation. ) 60 capsule 0  . insulin aspart (NOVOLOG) 100 UNIT/ML injection Inject 5 Units into the skin 3 (three) times daily with meals. (Patient taking differently: Inject 7-15 Units into the skin 3 (three) times daily with meals. Skip dose if blood sugar is under 100) 10 mL 11  . ipratropium-albuterol (DUONEB) 0.5-2.5 (3) MG/3ML SOLN Take 3 mLs by nebulization every 6 (six) hours as needed (shortness of breath).    . iron polysaccharides (NIFEREX) 150 MG capsule Take 1 capsule (150 mg total) by mouth daily. (Patient taking differently: Take 150 mg by mouth every evening. ) 30 capsule 0  . LEVEMIR 100 UNIT/ML injection Inject 0.12 mLs (12 Units total) into the skin daily. (Patient taking  differently: Inject 30 Units into the skin at bedtime. ) 10 mL 0  . pantoprazole (PROTONIX) 40 MG tablet Take 1 tablet (40 mg total) by mouth 2 (two) times daily. 60 tablet 0  . polyethylene glycol powder (GLYCOLAX/MIRALAX) 17 GM/SCOOP powder Take 17 g by mouth daily.    . potassium chloride (KLOR-CON) 10 MEQ tablet Take 10 mEq by mouth every evening.    . pravastatin (PRAVACHOL) 10 MG tablet Take 10 mg by mouth at bedtime.    . senna-docusate (SENOKOT-S) 8.6-50 MG tablet Take by mouth. Take 2-4 tablets by mouth nightly as needed for Constipation    . tamsulosin (FLOMAX) 0.4 MG CAPS capsule Take 0.4 mg by mouth daily.    Marland Kitchen torsemide (DEMADEX) 20 MG tablet Take 20 mg by mouth every Monday, Wednesday, and Friday.    . traMADol (ULTRAM) 50 MG tablet Take by mouth. Take 0.5-1 tablets (25-50 mg total) by mouth every 6 (six) hours as needed for Pain    . HYDROcodone-acetaminophen (NORCO) 5-325 MG tablet Take 1 tablet by mouth every 6 (six) hours as needed. (Patient not taking: Reported on 01/15/2019) 20 tablet 0  . HYDROcodone-acetaminophen (NORCO/VICODIN) 5-325 MG tablet Take 1 tablet by mouth every 12 (twelve) hours as needed for moderate pain.      No current facility-administered medications for this visit.     PHYSICAL EXAMINATION: ECOG PERFORMANCE STATUS: 3 - Symptomatic, >50% confined to bed Vitals:   01/15/19 1432  BP: (!) 150/65  Pulse: 73  Resp: 18  Temp: 98.9 F (37.2 C)   Filed Weights   01/15/19 1432  Weight: 208 lb (94.3 kg)    Physical Exam Constitutional:      General: He is not in acute distress.    Appearance: He is ill-appearing.     Comments: Sits in wheel chair  HENT:     Head: Normocephalic and atraumatic.  Eyes:     General: No scleral icterus.    Pupils: Pupils are equal, round, and reactive to light.  Cardiovascular:     Rate and Rhythm: Normal rate and regular rhythm.     Heart sounds: Normal heart sounds.  Pulmonary:     Effort: Pulmonary effort is  normal. No respiratory distress.     Breath sounds: No wheezing.     Comments: Breathes via nasal cannula oxygen. Bibasilar crackles. Abdominal:     General: Bowel sounds are normal. There is no distension.     Palpations: Abdomen is soft. There is no mass.     Tenderness: There is no abdominal tenderness.  Musculoskeletal:        General: No deformity. Normal range of motion.     Cervical back: Normal range of motion and  neck supple.  Skin:    General: Skin is warm and dry.     Coloration: Skin is pale.     Findings: No erythema or rash.  Neurological:     Mental Status: He is alert and oriented to person, place, and time.     Cranial Nerves: No cranial nerve deficit.     Coordination: Coordination normal.  Psychiatric:        Mood and Affect: Mood normal.        Behavior: Behavior normal.        Thought Content: Thought content normal.     LABORATORY DATA:  I have reviewed the data as listed Lab Results  Component Value Date   WBC 5.8 01/15/2019   HGB 6.6 (L) 01/15/2019   HCT 22.3 (L) 01/15/2019   MCV 99.1 01/15/2019   PLT 322 01/15/2019   Recent Labs    07/15/18 1544 07/24/18 1820 08/07/18 1818 08/08/18 0542 10/27/18 1550 10/27/18 1550 10/28/18 0500 10/28/18 0500 10/29/18 0547 10/31/18 1244 11/04/18 0547  NA 140   < > 133*   < > 135   < > 135  --  136  --  134*  K 3.7   < > 3.3*   < > 4.3   < > 4.0  --  4.2  --  3.7  CL 95*   < > 89*   < > 100   < > 104  --  102  --  97*  CO2 33*   < > 32   < > 22   < > 24  --  26  --  28  GLUCOSE 137*   < > 199*   < > 160*   < > 130*  --  109*  --  107*  BUN 30*   < > 45*   < > 33*   < > 30*  --  31*  --  47*  CREATININE 2.03*   < > 2.48*   < > 2.41*   < > 2.36*   < > 2.58* 2.26* 2.28*  CALCIUM 7.6*   < > 8.7*   < > 8.7*   < > 8.1*  --  8.4*  --  8.0*  GFRNONAA 29*   < > 23*   < > 24*   < > 25*   < > 22* 26* 26*  GFRAA 34*   < > 27*   < > 28*   < > 28*   < > 26* 30* 30*  PROT 5.7*  --  8.0  --  7.4  --   --   --   --    --   --   ALBUMIN 3.0*  --  3.8  --  3.9  --   --   --   --   --   --   AST 20  --  14*  --  15  --   --   --   --   --   --   ALT 21  --  12  --  13  --   --   --   --   --   --   ALKPHOS 57  --  65  --  69  --   --   --   --   --   --   BILITOT 0.3  --  0.9  --  1.0  --   --   --   --   --   --    < > =  values in this interval not displayed.   Iron/TIBC/Ferritin/ %Sat    Component Value Date/Time   IRON 47 01/01/2019 1317   IRON 36 (L) 07/15/2018 1544   TIBC 175 (L) 01/01/2019 1317   TIBC 172 (L) 07/15/2018 1544   FERRITIN 410 (H) 01/01/2019 1317   FERRITIN 535 (H) 07/15/2018 1544   IRONPCTSAT 27 01/01/2019 1317   IRONPCTSAT 21 07/15/2018 1544      RADIOGRAPHIC STUDIES: I have personally reviewed the radiological images as listed and agreed with the findings in the report.  DG Lumbar Spine 2-3 Views  Result Date: 12/24/2018 CLINICAL DATA:  Kyphoplasty EXAM: LUMBAR SPINE - 2-3 VIEW; DG C-ARM 1-60 MIN COMPARISON:  Lumbar spine 10/31/2018 FINDINGS: Changes of 2 level kyphoplasty noted in the lumbar spine. No visible complicating feature. IMPRESSION: Two level lumbar kyphoplasty changes. No visible complicating feature. Electronically Signed   By: Rolm Baptise M.D.   On: 12/24/2018 17:30   DG C-Arm 1-60 Min  Result Date: 12/24/2018 CLINICAL DATA:  Kyphoplasty EXAM: LUMBAR SPINE - 2-3 VIEW; DG C-ARM 1-60 MIN COMPARISON:  Lumbar spine 10/31/2018 FINDINGS: Changes of 2 level kyphoplasty noted in the lumbar spine. No visible complicating feature. IMPRESSION: Two level lumbar kyphoplasty changes. No visible complicating feature. Electronically Signed   By: Rolm Baptise M.D.   On: 12/24/2018 17:30      ASSESSMENT & PLAN:  1. Anemia of chronic renal failure, unspecified CKD stage   2. Symptomatic anemia   3. Goals of care, counseling/discussion    Labs reviewed and discussed with patient.  Patient has acute drop of hemoglobin to 6.6. There is no [facility] at [facility] infusion [facility]  for blood transfusion and I recommend patient to go to emergency room for blood transfusion.  Patient declines. Patient will return on 01/16/2019 morning for 1 unit of PRBC transfusion.  Patient received Retacrit monthly since August 2020, and most recently  60,000 units on 12/18/2018 He continues to drop hemoglobin.  Previous work up includes normal MM work up, normal light chain ratio. Normal folate and vitamin b12  Discussed with patient and daughter that I would recommend bone marrow biopsy to rule out MDS.  I explained about the bone marrow biopsy procedure.  Patient has multiple comorbidities.  And has already been followed up by palliative service. Goal of care was discussed.  Patient would like to consider and update me. Repeat blood work 01/20/2019. Patient will follow up in 1 week  Orders Placed This Encounter  Procedures  . Technologist smear review    Standing Status:   Future    Number of Occurrences:   1    Standing Expiration Date:   01/15/2020  . CBC with Differential    Standing Status:   Future    Standing Expiration Date:   01/15/2020  . Hold Tube- Blood Bank    Standing Status:   Future    Number of Occurrences:   1    Standing Expiration Date:   01/15/2020  . Hold Tube- Blood Bank    Standing Status:   Future    Standing Expiration Date:   01/15/2020    All questions were answered. The patient knows to call the clinic with any problems questions or concerns.   Earlie Server, MD, PhD Hematology Oncology [facility] Pager- [phone] 01/16/2019

## 2019-01-17 LAB — BPAM RBC
Blood Product Expiration Date: 202102112359
ISSUE DATE / TIME: 202101140907
Unit Type and Rh: 6200

## 2019-01-17 LAB — TYPE AND SCREEN
ABO/RH(D): A POS
Antibody Screen: NEGATIVE
Unit division: 0

## 2019-01-20 ENCOUNTER — Inpatient Hospital Stay: Payer: Medicare HMO

## 2019-01-20 ENCOUNTER — Telehealth: Payer: Self-pay | Admitting: *Deleted

## 2019-01-20 NOTE — Telephone Encounter (Signed)
Call returned to [place] and apt for today has been cancelled and she will call us with test results expected today

## 2019-01-20 NOTE — Telephone Encounter (Signed)
Discussed with MD.  He will not be able to enter the building with pending COVID test.  He does have an appt on 1/21 for lab/MD/infusion.  Hopefully he will have the COVID test results for this appt.  Ask them to please keep [place] updated with results.

## 2019-01-20 NOTE — Telephone Encounter (Signed)
Patient has been exposed to someone who is positive for COVID and is awaiting results of his COVID test done Saturday. He has a lab appointment today and is asking if he can keep this appointment or not. He has no fever or symptoms of virus. Please advise if he needs to reschedule or if he can come.

## 2019-01-21 ENCOUNTER — Telehealth: Payer: Self-pay | Admitting: *Deleted

## 2019-01-21 NOTE — Telephone Encounter (Signed)
Daughter called to report that patient COVID test is negative and he will be at appointment Thursday as scheduled

## 2019-01-23 ENCOUNTER — Inpatient Hospital Stay: Payer: Medicare HMO

## 2019-01-23 ENCOUNTER — Encounter: Payer: Self-pay | Admitting: Oncology

## 2019-01-23 ENCOUNTER — Other Ambulatory Visit: Payer: Self-pay

## 2019-01-23 ENCOUNTER — Inpatient Hospital Stay (HOSPITAL_BASED_OUTPATIENT_CLINIC_OR_DEPARTMENT_OTHER): Payer: Medicare HMO | Admitting: Oncology

## 2019-01-23 ENCOUNTER — Encounter: Payer: Self-pay | Admitting: Nurse Practitioner

## 2019-01-23 ENCOUNTER — Other Ambulatory Visit: Payer: Medicare HMO | Admitting: Nurse Practitioner

## 2019-01-23 VITALS — BP 150/69 | HR 88 | Temp 98.9°F | Resp 16

## 2019-01-23 DIAGNOSIS — N184 Chronic kidney disease, stage 4 (severe): Secondary | ICD-10-CM | POA: Diagnosis not present

## 2019-01-23 DIAGNOSIS — N183 Chronic kidney disease, stage 3 unspecified: Secondary | ICD-10-CM | POA: Diagnosis not present

## 2019-01-23 DIAGNOSIS — I509 Heart failure, unspecified: Secondary | ICD-10-CM | POA: Diagnosis not present

## 2019-01-23 DIAGNOSIS — J9610 Chronic respiratory failure, unspecified whether with hypoxia or hypercapnia: Secondary | ICD-10-CM | POA: Diagnosis not present

## 2019-01-23 DIAGNOSIS — Z515 Encounter for palliative care: Secondary | ICD-10-CM | POA: Diagnosis not present

## 2019-01-23 DIAGNOSIS — Z7189 Other specified counseling: Secondary | ICD-10-CM | POA: Diagnosis not present

## 2019-01-23 DIAGNOSIS — E1122 Type 2 diabetes mellitus with diabetic chronic kidney disease: Secondary | ICD-10-CM | POA: Diagnosis not present

## 2019-01-23 DIAGNOSIS — D631 Anemia in chronic kidney disease: Secondary | ICD-10-CM

## 2019-01-23 DIAGNOSIS — Z79899 Other long term (current) drug therapy: Secondary | ICD-10-CM | POA: Diagnosis not present

## 2019-01-23 DIAGNOSIS — J439 Emphysema, unspecified: Secondary | ICD-10-CM | POA: Diagnosis not present

## 2019-01-23 DIAGNOSIS — E785 Hyperlipidemia, unspecified: Secondary | ICD-10-CM | POA: Diagnosis not present

## 2019-01-23 DIAGNOSIS — I129 Hypertensive chronic kidney disease with stage 1 through stage 4 chronic kidney disease, or unspecified chronic kidney disease: Secondary | ICD-10-CM | POA: Diagnosis not present

## 2019-01-23 LAB — HEPATIC FUNCTION PANEL
ALT: 12 U/L (ref 0–44)
AST: 13 U/L — ABNORMAL LOW (ref 15–41)
Albumin: 3.2 g/dL — ABNORMAL LOW (ref 3.5–5.0)
Alkaline Phosphatase: 77 U/L (ref 38–126)
Bilirubin, Direct: <0.1 mg/dL (ref 0.0–0.2)
Total Bilirubin: 0.6 mg/dL (ref 0.3–1.2)
Total Protein: 7.8 g/dL (ref 6.5–8.1)

## 2019-01-23 LAB — CBC WITH DIFFERENTIAL/PLATELET
Abs Immature Granulocytes: 0.02 10*3/uL (ref 0.00–0.07)
Basophils Absolute: 0 10*3/uL (ref 0.0–0.1)
Basophils Relative: 1 %
Eosinophils Absolute: 0.4 10*3/uL (ref 0.0–0.5)
Eosinophils Relative: 7 %
HCT: 26.3 % — ABNORMAL LOW (ref 39.0–52.0)
Hemoglobin: 8 g/dL — ABNORMAL LOW (ref 13.0–17.0)
Immature Granulocytes: 0 %
Lymphocytes Relative: 18 %
Lymphs Abs: 1.1 10*3/uL (ref 0.7–4.0)
MCH: 29.5 pg (ref 26.0–34.0)
MCHC: 30.4 g/dL (ref 30.0–36.0)
MCV: 97 fL (ref 80.0–100.0)
Monocytes Absolute: 0.8 10*3/uL (ref 0.1–1.0)
Monocytes Relative: 14 %
Neutro Abs: 3.5 10*3/uL (ref 1.7–7.7)
Neutrophils Relative %: 60 %
Platelets: 294 10*3/uL (ref 150–400)
RBC: 2.71 MIL/uL — ABNORMAL LOW (ref 4.22–5.81)
RDW: 17.8 % — ABNORMAL HIGH (ref 11.5–15.5)
WBC: 5.8 10*3/uL (ref 4.0–10.5)
nRBC: 0 % (ref 0.0–0.2)

## 2019-01-23 LAB — SAMPLE TO BLOOD BANK

## 2019-01-23 MED ORDER — EPOETIN ALFA-EPBX 10000 UNIT/ML IJ SOLN
20000.0000 [IU] | Freq: Once | INTRAMUSCULAR | Status: AC
  Administered 2019-01-23: 20000 [IU] via SUBCUTANEOUS
  Filled 2019-01-23: qty 2

## 2019-01-23 MED ORDER — EPOETIN ALFA-EPBX 40000 UNIT/ML IJ SOLN: 60000.0000 [IU] | Freq: Once | INTRAMUSCULAR | Status: DC

## 2019-01-23 MED ORDER — EPOETIN ALFA-EPBX 40000 UNIT/ML IJ SOLN
40000.0000 [IU] | Freq: Once | INTRAMUSCULAR | Status: AC
  Administered 2019-01-23: 10:00:00 40000 [IU] via SUBCUTANEOUS
  Filled 2019-01-23: qty 1

## 2019-01-23 NOTE — Progress Notes (Signed)
Hematology/Oncology  [facility] [phone] [phone]   Patient Care Team: Baxter Hire, MD as PCP - General (Internal Medicine) Earlie Server, MD as Consulting Physician (Oncology)  REFERRING PROVIDER: Baxter Hire, MD  CHIEF COMPLAINTS/REASON FOR VISIT:  Follow-up for anemia secondary to chronic kidney disease.  HISTORY OF PRESENTING ILLNESS:   Arthur [name] is a  86 y.o.  male with PMH listed below was seen in consultation at the request of  Baxter Hire, MD  for evaluation of anemia Patient has chronic comorbidities including diabetes, COPD/emphysema, chronic respiratory failure on home oxygen, CKD stage III, CHF, hypertension and hyperlipidemia. Reviewed patient's previous lab records. Extensive medical records review was performed by me. Anemia has been chronic, dated back to at least 2016.  At that time his hemoglobin was around 11-12. Hemoglobin started to trended down below 10 starting February 2020. 02/11/2018 hemoglobin 9, hematocrit 28.6, 06/17/2018 hemoglobin 7.3 hematocrit of 24, 06/27/2018 hemoglobin 5.8 07/15/2018 hemoglobin 7.5 07/29/2018 hemoglobin 8.2, 08/12/2018, hemoglobin 9.2   INTERVAL HISTORY Arthur [name] is a 86 y.o. male who has above history reviewed by me today presents for follow up visit for management of anemia Problems and complaints are listed below: Status post 1 unit of blood transfusion last week. He feels better.  Fatigue has improved.  Shortness of breath has improved. Accompanied by daughter.   Review of Systems  Constitutional: Positive for fatigue. Negative for appetite change, chills, fever and unexpected weight change.  HENT:   Negative for hearing loss and voice change.   Eyes: Negative for eye problems and icterus.  Respiratory: Negative for chest tightness, cough and shortness of breath.   Cardiovascular: Positive for leg swelling. Negative for chest pain.  Gastrointestinal:  Negative for abdominal distention and abdominal pain.  Endocrine: Negative for hot flashes.  Genitourinary: Negative for difficulty urinating, dysuria and frequency.   Musculoskeletal: Positive for back pain. Negative for arthralgias.       Chronic back pain  Skin: Negative for itching and rash.  Neurological: Negative for light-headedness and numbness.  Hematological: Negative for adenopathy. Does not bruise/bleed easily.  Psychiatric/Behavioral: Negative for confusion.    MEDICAL HISTORY:  Past Medical History:  Diagnosis Date  . Anemia of chronic kidney failure 08/21/2018  . CHF (congestive heart failure) ([place])   . CKD (chronic kidney disease), stage III   . COPD (chronic obstructive pulmonary disease) ([place])   . Diabetes mellitus without complication ([place])   . Hyperlipidemia   . Hypertension     SURGICAL HISTORY: Past Surgical History:  Procedure Laterality Date  . COLONOSCOPY WITH PROPOFOL N/A 06/11/2018   Procedure: COLONOSCOPY WITH PROPOFOL;  Surgeon: Jonathon Bellows, MD;  Location: [facility] ENDOSCOPY;  Service: Gastroenterology;  Laterality: N/A;  . COLONOSCOPY WITH PROPOFOL N/A 06/13/2018   Procedure: COLONOSCOPY WITH PROPOFOL;  Surgeon: Jonathon Bellows, MD;  Location: [facility] ENDOSCOPY;  Service: Gastroenterology;  Laterality: N/A;  . ESOPHAGOGASTRODUODENOSCOPY Left 06/10/2018   Procedure: ESOPHAGOGASTRODUODENOSCOPY (EGD);  Surgeon: Jonathon Bellows, MD;  Location: [facility] ENDOSCOPY;  Service: Gastroenterology;  Laterality: Left;  . GIVENS CAPSULE STUDY N/A 06/28/2018   Procedure: GIVENS CAPSULE STUDY;  Surgeon: Lucilla Lame, MD;  Location: [facility] ENDOSCOPY;  Service: Endoscopy;  Laterality: N/A;  . GIVENS CAPSULE STUDY N/A 06/30/2018   Procedure: GIVENS CAPSULE STUDY;  Surgeon: Jonathon Bellows, MD;  Location: [facility] ENDOSCOPY;  Service: Gastroenterology;  Laterality: N/A;  . KYPHOPLASTY N/A 10/31/2018   Procedure: KYPHOPLASTY L2;  Surgeon: [name], MD;  Location: [facility]  ORS;  Service: Orthopedics;   Laterality: N/A;  . KYPHOPLASTY N/A 12/24/2018   Procedure: KYPHOPLASTY L3;  Surgeon: [name], MD;  Location: [facility] ORS;  Service: Orthopedics;  Laterality: N/A;    SOCIAL HISTORY: Social History   Socioeconomic History  . Marital status: Widowed    Spouse name: Not on file  . Number of children: Not on file  . Years of education: Not on file  . Highest education level: Not on file  Occupational History  . Not on file  Tobacco Use  . Smoking status: Former Smoker    Packs/day: 2.00    Years: 35.00    Pack years: 70.00    Types: Cigarettes    Quit date: 11/04/1982    Years since quitting: 36.2  . Smokeless tobacco: Current User  Substance and Sexual Activity  . Alcohol use: Not Currently  . Drug use: No  . Sexual activity: Not Currently  Other Topics Concern  . Not on file  Social History Narrative  . Not on file   Social Determinants of Health   Financial Resource Strain: Low Risk   . Difficulty of Paying Living Expenses: Not hard at all  Food Insecurity: No Food Insecurity  . Worried About Charity fundraiser in the Last Year: Never true  . Ran Out of Food in the Last Year: Never true  Transportation Needs: No Transportation Needs  . Lack of Transportation (Medical): No  . Lack of Transportation (Non-Medical): No  Physical Activity: Unknown  . Days of Exercise per Week: 0 days  . Minutes of Exercise per Session: Not on file  Stress: No Stress Concern Present  . Feeling of Stress : Not at all  Social Connections:   . Frequency of Communication with Friends and Family: Not on file  . Frequency of Social Gatherings with Friends and Family: Not on file  . Attends Religious Services: Not on file  . Active Member of Clubs or Organizations: Not on file  . Attends Archivist Meetings: Not on file  . Marital Status: Not on file  Intimate Partner Violence: Unknown  . Fear of Current or Ex-Partner: Patient refused  . Emotionally Abused: Patient refused    . Physically Abused: Patient refused  . Sexually Abused: Patient refused    FAMILY HISTORY: Family History  Problem Relation Age of Onset  . COPD Mother   . Cancer Father     ALLERGIES:  is allergic to codeine.  MEDICATIONS:  Current Outpatient Medications  Medication Sig Dispense Refill  . albuterol (VENTOLIN HFA) 108 (90 Base) MCG/ACT inhaler Inhale 2 puffs into the lungs every 6 (six) hours as needed for wheezing or shortness of breath. 1 Inhaler 2  . amLODipine (NORVASC) 2.5 MG tablet Take 2.5 mg by mouth daily.    Marland Kitchen ascorbic acid (VITAMIN C) 500 MG tablet Take 500 mg by mouth at bedtime.    . docusate sodium (COLACE) 100 MG capsule Take 1 capsule (100 mg total) by mouth 2 (two) times daily. (Patient not taking: Reported on 01/21/2019) 60 capsule 0  . HYDROcodone-acetaminophen (NORCO) 5-325 MG tablet Take 1 tablet by mouth every 6 (six) hours as needed. (Patient not taking: Reported on 01/15/2019) 20 tablet 0  . insulin aspart (NOVOLOG) 100 UNIT/ML injection Inject 5 Units into the skin 3 (three) times daily with meals. (Patient taking differently: Inject 15 Units into the skin 3 (three) times daily with meals. Skip dose if blood sugar is under 100) 10 mL  11  . ipratropium-albuterol (DUONEB) 0.5-2.5 (3) MG/3ML SOLN Take 3 mLs by nebulization 2 (two) times daily.     . iron polysaccharides (NIFEREX) 150 MG capsule Take 1 capsule (150 mg total) by mouth daily. (Patient taking differently: Take 150 mg by mouth every evening. ) 30 capsule 0  . LEVEMIR 100 UNIT/ML injection Inject 0.12 mLs (12 Units total) into the skin daily. (Patient taking differently: Inject 30 Units into the skin at bedtime. ) 10 mL 0  . pantoprazole (PROTONIX) 40 MG tablet Take 1 tablet (40 mg total) by mouth 2 (two) times daily. 60 tablet 0  . polyethylene glycol powder (GLYCOLAX/MIRALAX) 17 GM/SCOOP powder Take 17 g by mouth daily.    . potassium chloride (KLOR-CON) 10 MEQ tablet Take 10 mEq by mouth every evening.     . pravastatin (PRAVACHOL) 10 MG tablet Take 10 mg by mouth at bedtime.    . senna-docusate (SENOKOT-S) 8.6-50 MG tablet Take 2 tablets by mouth at bedtime.     . tamsulosin (FLOMAX) 0.4 MG CAPS capsule Take 0.4 mg by mouth daily.    Marland Kitchen torsemide (DEMADEX) 20 MG tablet Take 20 mg by mouth every Monday, Wednesday, and Friday.    . traMADol (ULTRAM) 50 MG tablet Take 25 mg by mouth 2 (two) times daily.      No current facility-administered medications for this visit.     PHYSICAL EXAMINATION: ECOG PERFORMANCE STATUS: 3 - Symptomatic, >50% confined to bed Vitals:   01/23/19 0841  BP: (!) 150/69  Pulse: 88  Resp: 16  Temp: 98.9 F (37.2 C)   There were no vitals filed for this visit.  Physical Exam Constitutional:      General: He is not in acute distress.    Appearance: He is ill-appearing.     Comments: Sits in wheel chair  HENT:     Head: Normocephalic and atraumatic.  Eyes:     General: No scleral icterus.    Pupils: Pupils are equal, round, and reactive to light.  Cardiovascular:     Rate and Rhythm: Normal rate and regular rhythm.     Heart sounds: Normal heart sounds.  Pulmonary:     Effort: Pulmonary effort is normal. No respiratory distress.     Breath sounds: No wheezing.     Comments: On nasal cannula oxygen. Bibasilar crackles. Abdominal:     General: Bowel sounds are normal. There is no distension.     Palpations: Abdomen is soft. There is no mass.     Tenderness: There is no abdominal tenderness.  Musculoskeletal:        General: No deformity. Normal range of motion.     Cervical back: Normal range of motion and neck supple.  Skin:    General: Skin is warm and dry.     Coloration: Skin is pale.     Findings: No erythema or rash.  Neurological:     Mental Status: He is alert and oriented to person, place, and time.     Cranial Nerves: No cranial nerve deficit.     Coordination: Coordination normal.  Psychiatric:        Mood and Affect: Mood normal.         Behavior: Behavior normal.        Thought Content: Thought content normal.     LABORATORY DATA:  I have reviewed the data as listed Lab Results  Component Value Date   WBC 5.8 01/15/2019   HGB 6.6 (L) 01/15/2019  HCT 22.3 (L) 01/15/2019   MCV 99.1 01/15/2019   PLT 322 01/15/2019   Recent Labs    07/15/18 1544 07/24/18 1820 08/07/18 1818 08/08/18 0542 10/27/18 1550 10/27/18 1550 10/28/18 0500 10/28/18 0500 10/29/18 0547 10/31/18 1244 11/04/18 0547  NA 140   < > 133*   < > 135   < > 135  --  136  --  134*  K 3.7   < > 3.3*   < > 4.3   < > 4.0  --  4.2  --  3.7  CL 95*   < > 89*   < > 100   < > 104  --  102  --  97*  CO2 33*   < > 32   < > 22   < > 24  --  26  --  28  GLUCOSE 137*   < > 199*   < > 160*   < > 130*  --  109*  --  107*  BUN 30*   < > 45*   < > 33*   < > 30*  --  31*  --  47*  CREATININE 2.03*   < > 2.48*   < > 2.41*   < > 2.36*   < > 2.58* 2.26* 2.28*  CALCIUM 7.6*   < > 8.7*   < > 8.7*   < > 8.1*  --  8.4*  --  8.0*  GFRNONAA 29*   < > 23*   < > 24*   < > 25*   < > 22* 26* 26*  GFRAA 34*   < > 27*   < > 28*   < > 28*   < > 26* 30* 30*  PROT 5.7*  --  8.0  --  7.4  --   --   --   --   --   --   ALBUMIN 3.0*  --  3.8  --  3.9  --   --   --   --   --   --   AST 20  --  14*  --  15  --   --   --   --   --   --   ALT 21  --  12  --  13  --   --   --   --   --   --   ALKPHOS 57  --  65  --  69  --   --   --   --   --   --   BILITOT 0.3  --  0.9  --  1.0  --   --   --   --   --   --    < > = values in this interval not displayed.   Iron/TIBC/Ferritin/ %Sat    Component Value Date/Time   IRON 47 01/01/2019 1317   IRON 36 (L) 07/15/2018 1544   TIBC 175 (L) 01/01/2019 1317   TIBC 172 (L) 07/15/2018 1544   FERRITIN 410 (H) 01/01/2019 1317   FERRITIN 535 (H) 07/15/2018 1544   IRONPCTSAT 27 01/01/2019 1317   IRONPCTSAT 21 07/15/2018 1544      RADIOGRAPHIC STUDIES: I have personally reviewed the radiological images as listed and agreed with the  findings in the report.  DG Lumbar Spine 2-3 Views  Result Date: 12/24/2018 CLINICAL DATA:  Kyphoplasty EXAM: LUMBAR SPINE - 2-3 VIEW; DG C-ARM 1-60 MIN COMPARISON:  Lumbar  spine 10/31/2018 FINDINGS: Changes of 2 level kyphoplasty noted in the lumbar spine. No visible complicating feature. IMPRESSION: Two level lumbar kyphoplasty changes. No visible complicating feature. Electronically Signed   By: Rolm Baptise M.D.   On: 12/24/2018 17:30   DG C-Arm 1-60 Min  Result Date: 12/24/2018 CLINICAL DATA:  Kyphoplasty EXAM: LUMBAR SPINE - 2-3 VIEW; DG C-ARM 1-60 MIN COMPARISON:  Lumbar spine 10/31/2018 FINDINGS: Changes of 2 level kyphoplasty noted in the lumbar spine. No visible complicating feature. IMPRESSION: Two level lumbar kyphoplasty changes. No visible complicating feature. Electronically Signed   By: Rolm Baptise M.D.   On: 12/24/2018 17:30      ASSESSMENT & PLAN:  1. Anemia of chronic renal failure, stage 4 (severe) ([place])   2. CKD (chronic kidney disease) stage 4, GFR 15-29 ml/min (HCC)   3. Goals of care, counseling/discussion    #Anemia of chronic kidney disease. Labs are reviewed and discussed with patient and daughter. Hemoglobin has improved to 8. Hold transfusion today. Proceed with Retacrit 60,000 units today. Bone marrow biopsy was discussed with patient and daughter again. Given patient's multiple comorbidities, likelihood of not a candidate for aggressive chemotherapy treatments, patient and daughter decided not to proceed with aggressive diagnostic procedure including bone marrow biopsy. They agree with supportive care with palliative intent. Goal of care, patient has already established care with palliative service for multiple medical problems.  Repeat CBC in 2 weeks, hold tube for possible blood transfusion. Monthly Retacrit. Patient and daughter agree with the plan.  # CKD, avoid nephro toxins. All questions were answered. The patient knows to call the clinic with  any problems questions or concerns.   Earlie Server, MD, PhD Hematology Oncology [facility] at [facility] Pager- [phone] 01/23/2019

## 2019-01-23 NOTE — Progress Notes (Signed)
Patient couldn't tell any improvement after blood transfusion last week.

## 2019-01-23 NOTE — Progress Notes (Signed)
[facility] Consult Note Telephone: [phone]  Fax: [phone]  PATIENT NAME: Arthur [name] DOB: 1933-03-26 MRN: [id number]  PRIMARY CARE PROVIDER:   Baxter Hire, MD  REFERRING PROVIDER:  Baxter Hire, MD [place],  [place] [address] RESPONSIBLE PARTY:[name] daughter [phone]  Due to the COVID-19 crisis, this visit was done via telemedicine from my office and it was initiated and consent by this patient and or family.  RECOMMENDATIONS and PLAN: 1.ACP: DNR MOST form to be discussed at next [facility] visit.   2.Dyspneic secondary toCOPD/CHF;remain stable at present time. ContinueO2, inhalers, inhalation therapy as needed.   3.Pain, continue with tramadol, continue senokot for constipation. Discussed pain management, regimen.  4.Palliative care encounter Palliative medicine team will continue to support patient, patient's family, and medical team. Visit consisted of counseling and education dealing with the complex and emotionally intense issues of symptom management and palliative care in the setting of serious and potentially life-threatening illness  I spent 45  minutes providing this consultation,  from 4:30pm to 5:15pm. More than 50% of the time in this consultation was spent coordinating communication.   HISTORY OF PRESENT ILLNESS:  Arthur [name] is a 86 y.o. year old male with multiple medical problems including COPD, congestive heart failure, anemia of chronic disease, chronic kidney disease stage 3, diabetes, hypertension, hyperlipidemia, history of GI bleed, allergies, tobacco use, obesity. I called Arthur [name] for scheduled telemedicine telephonic gets video not available palliative care follow-up visit for Arthur [name]. [name] and I talked about purpose palliative care visit and an agreement. We talked about how Arthur [name] was feeling today. Arthur [name] has recently returned for blood transfusion at  the [facility] as his hemoglobin has dropped to 6, received 1 unit PRBC with repeat Hgb to 8. We talked about symptoms of pain and Arthur [name] has been taking 25 mg of Tramadol twice a day in addition to the Senokot. This is working well for him with pain control and decreasing constipation. [name] endorses visit to the cancer center they discuss the possible bone marrow biopsy as to determine the cause of anemia though after discussion Arthur [name] declined. [name] and I talked about medical goals of care. We talked about chronic disease progression. We talked about recent events including having to be recently Covid exposure as she and Arthur. [name] were negative. We talked about upcoming MRI in hopes that it could be completed with sedation. Discuss that at this point will wait for MRI results to see if direction will be more Physical Therapy if Arthur [name] is willing to do that for switching to more Comfort Care goals. We talked about role of palliative care and plan of care. We talk about Hospice Services. Discussed if continued transfusions he would not be eligible for hospice until decided to stop transfusions. We discussed at length will see if he was able to maintain his hemoglobin an MRI results. We talked about role of palliative care and palliative care. We talked about next palliative visit in 2 weeks after MRI. Lisa in agreement.  Appointments scheduled. [name] talked about family dynamics. We talked about caregiver fatigue. I talked about coping strategies. Therapeutic listening and emotional support provided. Questions answered to satisfaction. Contact information Palliative Care was asked to help address goals of care.   CODE STATUS: DNR  PPS: 40% HOSPICE ELIGIBILITY/DIAGNOSIS: TBD  PAST MEDICAL HISTORY:  Past Medical History:  Diagnosis Date  . Anemia of  chronic kidney failure 08/21/2018  . CHF (congestive heart failure) ([place])   . CKD (chronic kidney disease), stage III   . COPD (chronic  obstructive pulmonary disease) ([place])   . Diabetes mellitus without complication ([place])   . Hyperlipidemia   . Hypertension     SOCIAL HX:  Social History   Tobacco Use  . Smoking status: Former Smoker    Packs/day: 2.00    Years: 35.00    Pack years: 70.00    Types: Cigarettes    Quit date: 11/04/1982    Years since quitting: 36.2  . Smokeless tobacco: Current User  Substance Use Topics  . Alcohol use: Not Currently    ALLERGIES:  Allergies  Allergen Reactions  . Codeine Other (See Comments)    Constipation     PERTINENT MEDICATIONS:  Outpatient Encounter Medications as of 01/23/2019  Medication Sig  . albuterol (VENTOLIN HFA) 108 (90 Base) MCG/ACT inhaler Inhale 2 puffs into the lungs every 6 (six) hours as needed for wheezing or shortness of breath.  Marland Kitchen amLODipine (NORVASC) 2.5 MG tablet Take 2.5 mg by mouth daily.  Marland Kitchen ascorbic acid (VITAMIN C) 500 MG tablet Take 500 mg by mouth at bedtime.  . docusate sodium (COLACE) 100 MG capsule Take 1 capsule (100 mg total) by mouth 2 (two) times daily. (Patient not taking: Reported on 01/21/2019)  . HYDROcodone-acetaminophen (NORCO) 5-325 MG tablet Take 1 tablet by mouth every 6 (six) hours as needed. (Patient not taking: Reported on 01/15/2019)  . insulin aspart (NOVOLOG) 100 UNIT/ML injection Inject 5 Units into the skin 3 (three) times daily with meals. (Patient taking differently: Inject 15 Units into the skin 3 (three) times daily with meals. Skip dose if blood sugar is under 100)  . ipratropium-albuterol (DUONEB) 0.5-2.5 (3) MG/3ML SOLN Take 3 mLs by nebulization 2 (two) times daily.   . iron polysaccharides (NIFEREX) 150 MG capsule Take 1 capsule (150 mg total) by mouth daily. (Patient taking differently: Take 150 mg by mouth every evening. )  . LEVEMIR 100 UNIT/ML injection Inject 0.12 mLs (12 Units total) into the skin daily. (Patient taking differently: Inject 30 Units into the skin at bedtime. )  . pantoprazole (PROTONIX) 40 MG  tablet Take 1 tablet (40 mg total) by mouth 2 (two) times daily.  . polyethylene glycol powder (GLYCOLAX/MIRALAX) 17 GM/SCOOP powder Take 17 g by mouth daily.  . potassium chloride (KLOR-CON) 10 MEQ tablet Take 10 mEq by mouth every evening.  . pravastatin (PRAVACHOL) 10 MG tablet Take 10 mg by mouth at bedtime.  . senna-docusate (SENOKOT-S) 8.6-50 MG tablet Take 2 tablets by mouth at bedtime.   . tamsulosin (FLOMAX) 0.4 MG CAPS capsule Take 0.4 mg by mouth daily.  Marland Kitchen torsemide (DEMADEX) 20 MG tablet Take 20 mg by mouth every Monday, Wednesday, and Friday.  . traMADol (ULTRAM) 50 MG tablet Take 25 mg by mouth 2 (two) times daily.   . [EXPIRED] epoetin alfa-epbx (RETACRIT) injection 20,000 Units   . [EXPIRED] epoetin alfa-epbx (RETACRIT) injection 40,000 Units   . [DISCONTINUED] epoetin alfa-epbx (RETACRIT) injection 60,000 Units    No facility-administered encounter medications on file as of 01/23/2019.    PHYSICAL EXAM:  Deferred  Cohl Behrens Z Shawnese Magner, NP

## 2019-01-24 ENCOUNTER — Ambulatory Visit
Admission: RE | Admit: 2019-01-24 | Discharge: 2019-01-24 | Disposition: A | Discharge status: Home / Self Care | Payer: Medicare HMO | Source: Ambulatory Visit | Attending: Orthopedic Surgery | Admitting: Orthopedic Surgery

## 2019-01-24 ENCOUNTER — Encounter (HOSPITAL_COMMUNITY): Payer: Self-pay | Admitting: Orthopedic Surgery

## 2019-01-24 ENCOUNTER — Other Ambulatory Visit: Payer: Self-pay

## 2019-01-24 DIAGNOSIS — Z20822 Contact with and (suspected) exposure to covid-19: Secondary | ICD-10-CM | POA: Diagnosis not present

## 2019-01-24 DIAGNOSIS — Z01812 Encounter for preprocedural laboratory examination: Secondary | ICD-10-CM | POA: Diagnosis not present

## 2019-01-24 NOTE — Anesthesia Preprocedure Evaluation (Addendum)
Anesthesia Evaluation  Patient identified by MRN, date of birth, ID band Patient awake    Reviewed: Allergy & Precautions, NPO status , Patient's Chart, lab work & pertinent test results  Airway Mallampati: II  TM Distance: >3 FB Neck ROM: Full    Dental  (+) Dental Advisory Given, Upper Dentures, Lower Dentures   Pulmonary COPD,  COPD inhaler and oxygen dependent, former smoker,    Pulmonary exam normal breath sounds clear to auscultation       Cardiovascular hypertension, Pt. on medications +CHF  negative cardio ROS Normal cardiovascular exam Rhythm:Regular Rate:Normal     Neuro/Psych negative neurological ROS  negative psych ROS   GI/Hepatic negative GI ROS, Neg liver ROS,   Endo/Other  diabetes, Type 2, Insulin DependentObesity   Renal/GU Renal InsufficiencyRenal disease     Musculoskeletal  (+) Arthritis ,   Abdominal   Peds  Hematology  (+) Blood dyscrasia, anemia ,   Anesthesia Other Findings Day of surgery medications reviewed with the patient.  Reproductive/Obstetrics                            Anesthesia Physical Anesthesia Plan  ASA: IV  Anesthesia Plan: General   Post-op Pain Management:    Induction: Intravenous  PONV Risk Score and Plan: 2 and Dexamethasone and Ondansetron  Airway Management Planned: LMA  Additional Equipment:   Intra-op Plan:   Post-operative Plan: Extubation in OR  Informed Consent: I have reviewed the patients History and Physical, chart, labs and discussed the procedure including the risks, benefits and alternatives for the proposed anesthesia with the patient or authorized representative who has indicated his/her understanding and acceptance.     Dental advisory given  Plan Discussed with: CRNA  Anesthesia Plan Comments: (PAT note written 01/24/2019 by Myra Gianotti, PA-C. )       Anesthesia Quick Evaluation

## 2019-01-24 NOTE — Progress Notes (Signed)
Anesthesia Chart Review: Arthur [name]   Case: [id number] Date/Time: 01/28/19 1030   Procedure: MRI LUMBER SPINE WITHOUT CONTRAST (N/A )   Anesthesia type: General   Pre-op diagnosis: ACUTE MIDLINE LOW BACK PAIN   Location: [facility] OR RADIOLOGY ROOM / [place] [facility]   Surgeons: Radiologist, Medication, MD      DISCUSSION: Patient is an 86 year old male scheduled for the above procedure under anesthesia. MRI was ordered by Arthur Knows, MD due to concern for new compression fractures. H&P 01/08/19 from Arthur [name], [place] (see [place]).   History includes former smoker (quit 11/04/82), COPD/dyspnea (continuous O2 2-3L), HTN, HLlD, CKD (stage IV), chronic diastolic CHF, right BBB, anemia of chronic renal disease, DM2, kyphoplasty (L3 on 12/24/18 and L2 on 10/31/18), right pleural effusion (s/p right thoracentesis 07/10/18 and 08/08/18).  Seven admissions to [facility] since May 2020 and received anesthesia at least 4 times for either ENDO case or kyphoplasty:  - Admission 10/27/18-11/05/18 for pyelonephritis with sepsis. S/p kyphoplasty L2 10/31/18 for compression fracture. Discharged to rehab. (S/p out-patient L3 kyphoplasty 12/24/18). - Admission 08/07/18-08/09/18 for RUQ abdominal pain. Noted to have UTI and recurrent right pleural effusion. S/p thoracentesis 8/60/20. UTI treated with Rocephin and then oral Keflex.  - Admission 07/24/18-07/26/18 for GI bleed with HGB 6.6. S/p 1 unit PRBC.  - Admission 07/06/18-07/10/18 for hypoxia with acute on chronic diastolic HF and right sided pleural effusion, s/p right thoracentesis 07/10/18 and IV Lasix. EF 55-60%. Also treated with cefepime for HCAP. - Admission 06/27/18-07/05/18 for severe anemia (HGB 5.8), BLE cellulitis, acute on chronic respiratory faiulre. S/p PRBC. Capsule study on 06/30/18 showed no source of anemia and GI bleed. If worseing anemia consider CTA or RBC scan. He received wound care dressings for BLE wounds. - Admission 06/06/18-06/09/18 for weakness and anemia (HGB  3.6) due to GI bleed. S/p 4 units PRBC. By notes, 06/10/18 EGD showed no significant source of bleeding, and 06/13/18 colonoscopy showed no active bleeding but inadequate bowel prep.   - Admission 05/22/18-05/28/18 for RLE cellulitis with sepsis and acute hypoxic respiratory failure in setting of PNA/COPD exacerbation.    Per daughter Arthur [name], he has not had cough, fever, or chest pain. 01/23/10 pre-procedure COVID-19 test is in process. Anesthesia team to evaluate on the day of procedure.    VS: Ht 5' 8"  (1.727 m)   Wt 93 kg   BMI 31.17 kg/m   BP Readings from Last 3 Encounters:  01/23/19 (!) 150/69  01/16/19 (!) 148/82  01/15/19 (!) 150/65   Pulse Readings from Last 3 Encounters:  01/23/19 88  01/16/19 77  01/15/19 73    PROVIDERS: Baxter Hire, MD is PCP (see [facility] Everywhere) - Jonathon Bellows, MD is GI - Murlean Iba, MD is nephrologist ([place]; see [facility] Everywhere). Last evaluation 12/03/18. Three month follow-up recommended. Earlie Server, MD is hematologist (for anemia). Last evaluation 01/23/19. No transfusion recommended given improvement of HGB to 8.0. Retacrit given. Bone marrow biopsy discussed, but overall not felt to be a good candidate for aggressive chemotherapy if needed, so patient and daughter opted against bone marrow biopsy. - Palliative Care is through [employer]. Last evaluation 01/23/19 by [name], NP.    LABS: Most recent lab results include: Lab Results  Component Value Date   WBC 5.8 01/23/2019   HGB 8.0 (L) 01/23/2019   HCT 26.3 (L) 01/23/2019   PLT 294 01/23/2019   GLUCOSE 107 (H) 11/04/2018   ALT 12  01/23/2019   AST 13 (L) 01/23/2019   NA 134 (L) 11/04/2018   K 3.7 11/04/2018   CL 97 (L) 11/04/2018   CREATININE 2.28 (H) 11/04/2018   BUN 47 (H) 11/04/2018   CO2 28 11/04/2018   TSH 1.951 08/13/2018   INR 1.1 08/07/2018   HGBA1C 6.4 (H) 10/28/2018   A1c 6.9% on 12/17/18 ([place]).  HGB trends: 01/23/19 8.0 01/15/19 6.6 01/01/19 7.5 07/15/18 7.3  Lab Results  Component Value Date   CREATININE 2.28 (H) 11/04/2018   CREATININE 2.26 (H) 10/31/2018   CREATININE 2.58 (H) 10/29/2018     IMAGES: CHEST - 2 VIEW 10/27/18 COMPARISON:  August 08, 2018 FINDINGS: There is a fairly small pleural effusion with atelectatic change in the right base. Lungs elsewhere are clear. Heart size and pulmonary vascularity are normal. No adenopathy. Bones are osteoporotic. IMPRESSION: Fairly small right pleural effusion with right base atelectasis. No edema or consolidation. Cardiac silhouette stable from prior study. No adenopathy evident.   EKG: 06/27/18: Sinus rhythm with Premature atrial complexes Right bundle branch block Abnormal ECG When compared with ECG of 09-Jun-2018 11:21, PREVIOUS ECG IS PRESENT Confirmed by Bartholome Bill [phone]) on 06/28/2018 9:46:28 AM   CV: Echo 07/02/18: IMPRESSIONS  1. The left ventricle has normal systolic function, with an ejection fraction of 55-60%. The cavity size was normal. Left ventricular diastolic parameters were normal.  2. The right ventricle has normal systolic function. The cavity was normal. There is no increase in right ventricular wall thickness.  3. The aortic valve is tricuspid. Aortic valve regurgitation was not assessed by color flow Doppler.   Past Medical History:  Diagnosis Date  . Anemia of chronic kidney failure 08/21/2018  . Arthritis    lower back  . CHF (congestive heart failure) ([place])   . CKD (chronic kidney disease), stage III   . COPD (chronic obstructive pulmonary disease) ([place])   . Diabetes mellitus without complication ([place])    type 2  . Dyspnea    when active -wears oxygen 2L via [place]  . History of blood transfusion   . Hyperlipidemia   . Hypertension   . Requires continuous at home supplemental oxygen    2L via [place]    Past Surgical History:  Procedure Laterality Date  . COLONOSCOPY  WITH PROPOFOL N/A 06/11/2018   Procedure: COLONOSCOPY WITH PROPOFOL;  Surgeon: Jonathon Bellows, MD;  Location: [facility] ENDOSCOPY;  Service: Gastroenterology;  Laterality: N/A;  . COLONOSCOPY WITH PROPOFOL N/A 06/13/2018   Procedure: COLONOSCOPY WITH PROPOFOL;  Surgeon: Jonathon Bellows, MD;  Location: [facility] ENDOSCOPY;  Service: Gastroenterology;  Laterality: N/A;  . ESOPHAGOGASTRODUODENOSCOPY Left 06/10/2018   Procedure: ESOPHAGOGASTRODUODENOSCOPY (EGD);  Surgeon: Jonathon Bellows, MD;  Location: [facility] ENDOSCOPY;  Service: Gastroenterology;  Laterality: Left;  . EYE SURGERY     removed cataracts  . GIVENS CAPSULE STUDY N/A 06/28/2018   Procedure: GIVENS CAPSULE STUDY;  Surgeon: Lucilla Lame, MD;  Location: [facility] ENDOSCOPY;  Service: Endoscopy;  Laterality: N/A;  . GIVENS CAPSULE STUDY N/A 06/30/2018   Procedure: GIVENS CAPSULE STUDY;  Surgeon: Jonathon Bellows, MD;  Location: [facility] ENDOSCOPY;  Service: Gastroenterology;  Laterality: N/A;  . KYPHOPLASTY N/A 10/31/2018   Procedure: KYPHOPLASTY L2;  Surgeon: Arthur Knows, MD;  Location: [facility] ORS;  Service: Orthopedics;  Laterality: N/A;  . KYPHOPLASTY N/A 12/24/2018   Procedure: KYPHOPLASTY L3;  Surgeon: Arthur Knows, MD;  Location: [facility] ORS;  Service: Orthopedics;  Laterality: N/A;    MEDICATIONS: No current facility-administered medications for  this encounter.   Marland Kitchen albuterol (VENTOLIN HFA) 108 (90 Base) MCG/ACT inhaler  . amLODipine (NORVASC) 2.5 MG tablet  . ascorbic acid (VITAMIN C) 500 MG tablet  . insulin aspart (NOVOLOG) 100 UNIT/ML injection  . ipratropium-albuterol (DUONEB) 0.5-2.5 (3) MG/3ML SOLN  . iron polysaccharides (NIFEREX) 150 MG capsule  . LEVEMIR 100 UNIT/ML injection  . pantoprazole (PROTONIX) 40 MG tablet  . polyethylene glycol powder (GLYCOLAX/MIRALAX) 17 GM/SCOOP powder  . potassium chloride (KLOR-CON) 10 MEQ tablet  . pravastatin (PRAVACHOL) 10 MG tablet  . senna-docusate (SENOKOT-S) 8.6-50 MG tablet  . tamsulosin (FLOMAX) 0.4 MG CAPS capsule   . torsemide (DEMADEX) 20 MG tablet  . traMADol (ULTRAM) 50 MG tablet  . docusate sodium (COLACE) 100 MG capsule  . HYDROcodone-acetaminophen (NORCO) 5-325 MG tablet     Myra Gianotti, PA-C Surgical Short Stay/Anesthesiology [facility] Phone [phone] [facility] Phone [phone] 01/24/2019 5:07 PM

## 2019-01-24 NOTE — Progress Notes (Signed)
Spoke with Daughter [name] who states patient denies fever, cough or chest pain.  PCP - Dr Harrel Lemon Cardiologist - denies Gastro - Dr Jonathon Bellows Nephrology-Dr Harmeet Candiss [place] [facility]-Dr Earlie Server  Chest x-ray - 10/27/18 EKG - 06/27/18 Stress Test - denies ECHO - 07/02/18 Cardiac Cath - denies  Fasting Blood Sugar - unknown  Does not check morning sugars.  Checks with meals and qhs.  Checks Blood Sugar _4- times a day  . THE NIGHT BEFORE SURGERY, take 1/2 of your normal dose of Levemir insulin.    THE MORNING OF SURGERY, do not take any Novolog insulin unless your CBG is greater than 220.  Then you may take  of your sliding scale (correction) dose of insulin.  . If your blood sugar is less than 70 mg/dL, you will need to treat for low blood sugar: o Treat a low blood sugar (less than 70 mg/dL) with  cup of clear juice (cranberry or apple), 4 glucose tablets.  Recheck blood sugar in 15 minutes after treatment (to make sure it is greater than 70 mg/dL). If your blood sugar is not greater than 70 mg/dL on recheck, call [phone] for further instructions.  Anesthesia review: Yes  STOP now taking any Aspirin (unless otherwise instructed by your surgeon), Aleve, Naproxen, Ibuprofen, Motrin, Advil, Goody's, BC's, all herbal medications, fish oil, and all vitamins.   Coronavirus Screening Have you experienced the following symptoms:  Cough yes/no: No Fever (>100.25F)  yes/no: No Runny nose yes/no: No Sore throat yes/no: No Difficulty breathing/shortness of breath  yes/no: No  Have you traveled in the last 14 days and where? yes/no: No  Daughter [name] verbalized understanding of instructions that were given via phone.

## 2019-01-25 LAB — SARS CORONAVIRUS 2 (TAT 6-24 HRS): SARS Coronavirus 2: NEGATIVE

## 2019-01-28 ENCOUNTER — Encounter (HOSPITAL_COMMUNITY): Payer: Self-pay

## 2019-01-28 ENCOUNTER — Ambulatory Visit (HOSPITAL_COMMUNITY)
Admission: RE | Admit: 2019-01-28 | Discharge: 2019-01-28 | Disposition: A | Discharge status: Home / Self Care | Payer: Medicare HMO | Source: Ambulatory Visit | Attending: Orthopedic Surgery | Admitting: Orthopedic Surgery

## 2019-01-28 ENCOUNTER — Other Ambulatory Visit (HOSPITAL_COMMUNITY): Payer: Self-pay | Admitting: Orthopedic Surgery

## 2019-01-28 ENCOUNTER — Encounter (HOSPITAL_COMMUNITY): Payer: Self-pay | Admitting: Orthopedic Surgery

## 2019-01-28 ENCOUNTER — Ambulatory Visit (HOSPITAL_COMMUNITY)
Admission: RE | Admit: 2019-01-28 | Discharge: 2019-01-28 | Disposition: A | Discharge status: Home / Self Care | Payer: Medicare HMO | Attending: Orthopedic Surgery | Admitting: Orthopedic Surgery

## 2019-01-28 ENCOUNTER — Encounter (HOSPITAL_COMMUNITY)
Admission: RE | Disposition: A | Discharge status: Home / Self Care | Payer: Self-pay | Source: Home / Self Care | Attending: Orthopedic Surgery

## 2019-01-28 ENCOUNTER — Other Ambulatory Visit: Payer: Self-pay

## 2019-01-28 ENCOUNTER — Ambulatory Visit (HOSPITAL_COMMUNITY): Payer: Medicare HMO | Admitting: Vascular Surgery

## 2019-01-28 DIAGNOSIS — M48061 Spinal stenosis, lumbar region without neurogenic claudication: Secondary | ICD-10-CM | POA: Diagnosis not present

## 2019-01-28 DIAGNOSIS — Z794 Long term (current) use of insulin: Secondary | ICD-10-CM | POA: Insufficient documentation

## 2019-01-28 DIAGNOSIS — M47816 Spondylosis without myelopathy or radiculopathy, lumbar region: Secondary | ICD-10-CM | POA: Insufficient documentation

## 2019-01-28 DIAGNOSIS — M545 Low back pain, unspecified: Secondary | ICD-10-CM

## 2019-01-28 DIAGNOSIS — N184 Chronic kidney disease, stage 4 (severe): Secondary | ICD-10-CM | POA: Insufficient documentation

## 2019-01-28 DIAGNOSIS — E1122 Type 2 diabetes mellitus with diabetic chronic kidney disease: Secondary | ICD-10-CM | POA: Insufficient documentation

## 2019-01-28 DIAGNOSIS — I5032 Chronic diastolic (congestive) heart failure: Secondary | ICD-10-CM | POA: Diagnosis not present

## 2019-01-28 DIAGNOSIS — Z9981 Dependence on supplemental oxygen: Secondary | ICD-10-CM | POA: Insufficient documentation

## 2019-01-28 DIAGNOSIS — J449 Chronic obstructive pulmonary disease, unspecified: Secondary | ICD-10-CM | POA: Insufficient documentation

## 2019-01-28 DIAGNOSIS — Z87891 Personal history of nicotine dependence: Secondary | ICD-10-CM | POA: Diagnosis not present

## 2019-01-28 DIAGNOSIS — M4856XA Collapsed vertebra, not elsewhere classified, lumbar region, initial encounter for fracture: Secondary | ICD-10-CM | POA: Diagnosis not present

## 2019-01-28 DIAGNOSIS — I130 Hypertensive heart and chronic kidney disease with heart failure and stage 1 through stage 4 chronic kidney disease, or unspecified chronic kidney disease: Secondary | ICD-10-CM | POA: Diagnosis not present

## 2019-01-28 DIAGNOSIS — E785 Hyperlipidemia, unspecified: Secondary | ICD-10-CM | POA: Insufficient documentation

## 2019-01-28 DIAGNOSIS — Z79899 Other long term (current) drug therapy: Secondary | ICD-10-CM | POA: Diagnosis not present

## 2019-01-28 DIAGNOSIS — M1990 Unspecified osteoarthritis, unspecified site: Secondary | ICD-10-CM | POA: Diagnosis not present

## 2019-01-28 DIAGNOSIS — Z885 Allergy status to narcotic agent status: Secondary | ICD-10-CM | POA: Insufficient documentation

## 2019-01-28 DIAGNOSIS — J441 Chronic obstructive pulmonary disease with (acute) exacerbation: Secondary | ICD-10-CM | POA: Diagnosis not present

## 2019-01-28 HISTORY — DX: Unspecified osteoarthritis, unspecified site: M19.90

## 2019-01-28 HISTORY — PX: RADIOLOGY WITH ANESTHESIA: SHX6223

## 2019-01-28 HISTORY — DX: Personal history of other medical treatment: Z92.89

## 2019-01-28 HISTORY — DX: Dyspnea, unspecified: R06.00

## 2019-01-28 HISTORY — DX: Dependence on supplemental oxygen: Z99.81

## 2019-01-28 LAB — CBC
HCT: 25.7 % — ABNORMAL LOW (ref 39.0–52.0)
Hemoglobin: 7.8 g/dL — ABNORMAL LOW (ref 13.0–17.0)
MCH: 29.8 pg (ref 26.0–34.0)
MCHC: 30.4 g/dL (ref 30.0–36.0)
MCV: 98.1 fL (ref 80.0–100.0)
Platelets: 403 10*3/uL — ABNORMAL HIGH (ref 150–400)
RBC: 2.62 MIL/uL — ABNORMAL LOW (ref 4.22–5.81)
RDW: 17.9 % — ABNORMAL HIGH (ref 11.5–15.5)
WBC: 6.6 10*3/uL (ref 4.0–10.5)
nRBC: 0.6 % — ABNORMAL HIGH (ref 0.0–0.2)

## 2019-01-28 LAB — GLUCOSE, CAPILLARY
Glucose-Capillary: 139 mg/dL — ABNORMAL HIGH (ref 70–99)
Glucose-Capillary: 136 mg/dL — ABNORMAL HIGH (ref 70–99)

## 2019-01-28 LAB — BASIC METABOLIC PANEL
Anion gap: 11 (ref 5–15)
BUN: 35 mg/dL — ABNORMAL HIGH (ref 8–23)
CO2: 25 mmol/L (ref 22–32)
Calcium: 8.6 mg/dL — ABNORMAL LOW (ref 8.9–10.3)
Chloride: 100 mmol/L (ref 98–111)
Creatinine, Ser: 2.2 mg/dL — ABNORMAL HIGH (ref 0.61–1.24)
GFR calc Af Amer: 31 mL/min — ABNORMAL LOW (ref >60)
GFR calc non Af Amer: 27 mL/min — ABNORMAL LOW (ref >60)
Glucose, Bld: 165 mg/dL — ABNORMAL HIGH (ref 70–99)
Potassium: 4.2 mmol/L (ref 3.5–5.1)
Sodium: 136 mmol/L (ref 135–145)

## 2019-01-28 SURGERY — MRI WITH ANESTHESIA
Anesthesia: General

## 2019-01-28 MED ORDER — ONDANSETRON HCL 4 MG/2ML IJ SOLN
INTRAMUSCULAR | Status: DC | PRN
  Administered 2019-01-28: 4 mg via INTRAVENOUS

## 2019-01-28 MED ORDER — LIDOCAINE 2% (20 MG/ML) 5 ML SYRINGE
INTRAMUSCULAR | Status: DC | PRN
  Administered 2019-01-28: 80 mg via INTRAVENOUS

## 2019-01-28 MED ORDER — SODIUM CHLORIDE 0.9 % IV SOLN: INTRAVENOUS | Status: DC | PRN

## 2019-01-28 MED ORDER — PROPOFOL 10 MG/ML IV BOLUS
INTRAVENOUS | Status: DC | PRN
  Administered 2019-01-28: 70 mg via INTRAVENOUS
  Administered 2019-01-28: 30 mg via INTRAVENOUS

## 2019-01-28 NOTE — Transfer of Care (Signed)
Immediate Anesthesia Transfer of Care Note  Patient: Arthur [name]  Procedure(s) Performed: MRI LUMBER SPINE WITHOUT CONTRAST (N/A )  Patient Location: PACU  Anesthesia Type:General  Level of Consciousness: awake, alert  and patient cooperative  Airway & Oxygen Therapy: Patient Spontanous Breathing and Patient connected to nasal cannula oxygen  Post-op Assessment: Report given to RN and Post -op Vital signs reviewed and stable  Post vital signs: Reviewed and stable  Last Vitals:  Vitals Value Taken Time  BP 140/55 01/28/19 1040  Temp    Pulse 73 01/28/19 1041  Resp 11 01/28/19 1041  SpO2 100 % 01/28/19 1041  Vitals shown include unvalidated device data.  Last Pain:  Vitals:   01/28/19 0838  TempSrc:   PainSc: 0-No pain         Complications: No apparent anesthesia complications

## 2019-01-28 NOTE — Anesthesia Procedure Notes (Signed)
Procedure Name: LMA Insertion Date/Time: 01/28/2019 10:09 AM Performed by: Janace Litten, CRNA Pre-anesthesia Checklist: Patient identified, Emergency Drugs available, Suction available and Patient being monitored Patient Re-evaluated:Patient Re-evaluated prior to induction Oxygen Delivery Method: Circle System Utilized Preoxygenation: Pre-oxygenation with 100% oxygen Induction Type: IV induction Ventilation: Mask ventilation without difficulty LMA: LMA inserted LMA Size: 5.0 Number of attempts: 1 Placement Confirmation: positive ETCO2 Tube secured with: Tape Dental Injury: Teeth and Oropharynx as per pre-operative assessment

## 2019-01-28 NOTE — H&P (Signed)
Anesthesia H&P Update: History and Physical Exam reviewed; patient is OK for planned anesthetic and procedure. ? ?

## 2019-01-28 NOTE — Anesthesia Postprocedure Evaluation (Signed)
Anesthesia Post Note  Patient: Arthur [name]  Procedure(s) Performed: MRI LUMBER SPINE WITHOUT CONTRAST (N/A )     Patient location during evaluation: PACU Anesthesia Type: General Level of consciousness: awake and alert Pain management: pain level controlled Vital Signs Assessment: post-procedure vital signs reviewed and stable Respiratory status: spontaneous breathing, nonlabored ventilation and respiratory function stable Cardiovascular status: blood pressure returned to baseline and stable Postop Assessment: no apparent nausea or vomiting Anesthetic complications: no    Last Vitals:  Vitals:   01/28/19 1040 01/28/19 1055  BP: (!) 140/55 (!) 138/52  Pulse: 72 74  Resp: 14 13  Temp: 36.7 C 36.7 C  SpO2: 100% 98%    Last Pain:  Vitals:   01/28/19 1055  TempSrc:   PainSc: 0-No pain                 Catalina Gravel

## 2019-01-28 NOTE — Progress Notes (Signed)
Informed Dr. Gifford Shave of patient's blood pressure 147/42.  No orders received.

## 2019-02-03 ENCOUNTER — Other Ambulatory Visit: Payer: Self-pay

## 2019-02-03 ENCOUNTER — Encounter: Payer: Self-pay | Admitting: Nurse Practitioner

## 2019-02-03 ENCOUNTER — Other Ambulatory Visit: Payer: Medicare HMO | Admitting: Nurse Practitioner

## 2019-02-03 DIAGNOSIS — I509 Heart failure, unspecified: Secondary | ICD-10-CM

## 2019-02-03 DIAGNOSIS — J441 Chronic obstructive pulmonary disease with (acute) exacerbation: Secondary | ICD-10-CM | POA: Diagnosis not present

## 2019-02-03 DIAGNOSIS — Z515 Encounter for palliative care: Secondary | ICD-10-CM

## 2019-02-03 NOTE — Progress Notes (Signed)
[place] Consult Note Telephone: [phone]  Fax: [phone]  PATIENT NAME: Arthur [name] DOB: 1933/04/08 MRN: [id number]  PRIMARY CARE PROVIDER:   Baxter Hire, MD  REFERRING PROVIDER:  Baxter Hire, MD [address] [place],  [place] [address] PRIMARY CARE PROVIDER:   Baxter Hire, MD  REFERRING PROVIDER:  Baxter Hire, MD [place],  [place] [address] RESPONSIBLE PARTY:[name] daughter [phone]  Due to the COVID-19 crisis, this visit was done via telemedicine from my office and it was initiated and consent by this patient and or family.  RECOMMENDATIONS and PLAN: 1.ACP: DNR MOST form to be discussed at next [facility] visit.   2.Dyspneic secondary toCOPD/CHF;remain stable at present time. ContinueO2, inhalers, inhalation therapy as needed.   3.Pain, continue withtylenol,continuesenokot for constipation. Discussed pain management, regimen.  4.Palliative care encounter Palliative medicine team will continue to support patient, patient's family, and medical team. Visit consisted of counseling and education dealing with the complex and emotionally intense issues of symptom management and palliative care in the setting of serious and potentially life-threatening illness  I spent 45 minutes providing this consultation,  From 4:00pm to 4:45pm. More than 50% of the time in this consultation was spent coordinating communication.   HISTORY OF PRESENT ILLNESS:  Arthur [name] is a 86 y.o. year old male with multiple medical problems including COPD, congestive heart failure, anemia of chronic disease, chronic kidney disease stage 3, diabetes, hypertension, hyperlipidemia, history of GI bleed, allergies, tobacco use, obesity. I called [name],  Mr. Jovin daughter for schedule palliative care telemedicine telephonic as video not available follow-up visit. [name] and I talked about purpose or  palliative care visit and [name] was in agreement. We talked about how Mr Brevan has been feeling. Mr. Mel has been improving slowly. Mr. Seaton has not had any Tramadol as he's ran out since 2 days ago in Tylenol has been managing his pain. We talked about Mr. Alexi apoetite which has been fair. We talked about recent MRI and results continue to be pending as of present time. [name] endorses they are waiting for their appointment with Dr Rudene Christians to figure out the next step or treatment options. We talked about upcoming appointment with [name] with repeat lab work and possible infusion. We talked about functional-level. We talked about medical goals of care. We talked about role of palliative care and plan of care. Schedule follow-up appointment with palliative care in 3 weeks for further discussion of medical goals. Lisa in agreement with appointment schedule. Therapeutic listening and emotional support provided. Contact information. Questions answered to satisfaction.  Palliative Care was asked to help to continue to address goals of care.    CODE STATUS: DNR  PPS: 40% HOSPICE ELIGIBILITY/DIAGNOSIS: TBD  PAST MEDICAL HISTORY:  Past Medical History:  Diagnosis Date  . Anemia of chronic kidney failure 08/21/2018  . Arthritis    lower back  . CHF (congestive heart failure) ([place])   . CKD (chronic kidney disease), stage III   . COPD (chronic obstructive pulmonary disease) ([place])   . Diabetes mellitus without complication ([place])    type 2  . Dyspnea    when active -wears oxygen 2L via [place]  . History of blood transfusion   . Hyperlipidemia   . Hypertension   . Requires continuous at home supplemental oxygen    2L via [place]    SOCIAL HX:  Social History   Tobacco  Use  . Smoking status: Former Smoker    Packs/day: 2.00    Years: 35.00    Pack years: 70.00    Types: Cigarettes    Quit date: 11/04/1982    Years since quitting: 36.2  . Smokeless tobacco: Current User    Types: Chew  Substance Use Topics    . Alcohol use: Not Currently    ALLERGIES:  Allergies  Allergen Reactions  . Codeine Other (See Comments)    Constipation     PERTINENT MEDICATIONS:  Outpatient Encounter Medications as of 02/03/2019  Medication Sig  . albuterol (VENTOLIN HFA) 108 (90 Base) MCG/ACT inhaler Inhale 2 puffs into the lungs every 6 (six) hours as needed for wheezing or shortness of breath.  Marland Kitchen amLODipine (NORVASC) 2.5 MG tablet Take 2.5 mg by mouth daily.  Marland Kitchen ascorbic acid (VITAMIN C) 500 MG tablet Take 500 mg by mouth at bedtime.  . docusate sodium (COLACE) 100 MG capsule Take 1 capsule (100 mg total) by mouth 2 (two) times daily. (Patient not taking: Reported on 01/21/2019)  . HYDROcodone-acetaminophen (NORCO) 5-325 MG tablet Take 1 tablet by mouth every 6 (six) hours as needed. (Patient not taking: Reported on 01/15/2019)  . insulin aspart (NOVOLOG) 100 UNIT/ML injection Inject 5 Units into the skin 3 (three) times daily with meals. (Patient taking differently: Inject 15 Units into the skin 3 (three) times daily with meals. Skip dose if blood sugar is under 100)  . ipratropium-albuterol (DUONEB) 0.5-2.5 (3) MG/3ML SOLN Take 3 mLs by nebulization 2 (two) times daily.   . iron polysaccharides (NIFEREX) 150 MG capsule Take 1 capsule (150 mg total) by mouth daily. (Patient taking differently: Take 150 mg by mouth every evening. )  . LEVEMIR 100 UNIT/ML injection Inject 0.12 mLs (12 Units total) into the skin daily. (Patient taking differently: Inject 30 Units into the skin at bedtime. )  . pantoprazole (PROTONIX) 40 MG tablet Take 1 tablet (40 mg total) by mouth 2 (two) times daily.  . polyethylene glycol powder (GLYCOLAX/MIRALAX) 17 GM/SCOOP powder Take 17 g by mouth daily.  . potassium chloride (KLOR-CON) 10 MEQ tablet Take 10 mEq by mouth every evening.  . pravastatin (PRAVACHOL) 10 MG tablet Take 10 mg by mouth at bedtime.  . senna-docusate (SENOKOT-S) 8.6-50 MG tablet Take 2 tablets by mouth at bedtime.   .  tamsulosin (FLOMAX) 0.4 MG CAPS capsule Take 0.4 mg by mouth daily.  Marland Kitchen torsemide (DEMADEX) 20 MG tablet Take 20 mg by mouth every Monday, Wednesday, and Friday.  . traMADol (ULTRAM) 50 MG tablet Take 25 mg by mouth 2 (two) times daily.    No facility-administered encounter medications on file as of 02/03/2019.    PHYSICAL EXAM:  deferred  Maddison Kilner Ihor Gully, NP

## 2019-02-06 ENCOUNTER — Inpatient Hospital Stay: Payer: Medicare HMO | Admitting: Oncology

## 2019-02-06 ENCOUNTER — Other Ambulatory Visit: Payer: Self-pay

## 2019-02-06 ENCOUNTER — Encounter: Payer: Self-pay | Admitting: Oncology

## 2019-02-06 ENCOUNTER — Inpatient Hospital Stay: Payer: Medicare HMO

## 2019-02-06 ENCOUNTER — Inpatient Hospital Stay: Payer: Medicare HMO | Attending: Oncology

## 2019-02-06 VITALS — BP 172/67 | HR 76 | Temp 98.2°F | Resp 18 | Wt 204.4 lb

## 2019-02-06 DIAGNOSIS — D631 Anemia in chronic kidney disease: Secondary | ICD-10-CM

## 2019-02-06 DIAGNOSIS — E785 Hyperlipidemia, unspecified: Secondary | ICD-10-CM | POA: Insufficient documentation

## 2019-02-06 DIAGNOSIS — Z79899 Other long term (current) drug therapy: Secondary | ICD-10-CM | POA: Insufficient documentation

## 2019-02-06 DIAGNOSIS — N184 Chronic kidney disease, stage 4 (severe): Secondary | ICD-10-CM

## 2019-02-06 DIAGNOSIS — I509 Heart failure, unspecified: Secondary | ICD-10-CM | POA: Insufficient documentation

## 2019-02-06 DIAGNOSIS — E1122 Type 2 diabetes mellitus with diabetic chronic kidney disease: Secondary | ICD-10-CM | POA: Insufficient documentation

## 2019-02-06 DIAGNOSIS — J9610 Chronic respiratory failure, unspecified whether with hypoxia or hypercapnia: Secondary | ICD-10-CM | POA: Diagnosis not present

## 2019-02-06 DIAGNOSIS — J439 Emphysema, unspecified: Secondary | ICD-10-CM | POA: Diagnosis not present

## 2019-02-06 DIAGNOSIS — Z87891 Personal history of nicotine dependence: Secondary | ICD-10-CM | POA: Insufficient documentation

## 2019-02-06 DIAGNOSIS — E1136 Type 2 diabetes mellitus with diabetic cataract: Secondary | ICD-10-CM | POA: Diagnosis not present

## 2019-02-06 DIAGNOSIS — Z794 Long term (current) use of insulin: Secondary | ICD-10-CM | POA: Insufficient documentation

## 2019-02-06 DIAGNOSIS — I130 Hypertensive heart and chronic kidney disease with heart failure and stage 1 through stage 4 chronic kidney disease, or unspecified chronic kidney disease: Secondary | ICD-10-CM | POA: Insufficient documentation

## 2019-02-06 DIAGNOSIS — Z7189 Other specified counseling: Secondary | ICD-10-CM

## 2019-02-06 LAB — RETIC PANEL
Immature Retic Fract: 11 % (ref 2.3–15.9)
RBC.: 2.64 MIL/uL — ABNORMAL LOW (ref 4.22–5.81)
Retic Count, Absolute: 21.9 10*3/uL (ref 19.0–186.0)
Retic Ct Pct: 0.8 % (ref 0.4–3.1)
Reticulocyte Hemoglobin: 26.1 pg — ABNORMAL LOW (ref >27.9)

## 2019-02-06 LAB — CBC WITH DIFFERENTIAL/PLATELET
Abs Immature Granulocytes: 0.05 10*3/uL (ref 0.00–0.07)
Basophils Absolute: 0.1 10*3/uL (ref 0.0–0.1)
Basophils Relative: 1 %
Eosinophils Absolute: 0.3 10*3/uL (ref 0.0–0.5)
Eosinophils Relative: 6 %
HCT: 26.1 % — ABNORMAL LOW (ref 39.0–52.0)
Hemoglobin: 7.7 g/dL — ABNORMAL LOW (ref 13.0–17.0)
Immature Granulocytes: 1 %
Lymphocytes Relative: 20 %
Lymphs Abs: 1.1 10*3/uL (ref 0.7–4.0)
MCH: 28.7 pg (ref 26.0–34.0)
MCHC: 29.5 g/dL — ABNORMAL LOW (ref 30.0–36.0)
MCV: 97.4 fL (ref 80.0–100.0)
Monocytes Absolute: 0.6 10*3/uL (ref 0.1–1.0)
Monocytes Relative: 12 %
Neutro Abs: 3.4 10*3/uL (ref 1.7–7.7)
Neutrophils Relative %: 60 %
Platelets: 376 10*3/uL (ref 150–400)
RBC: 2.68 MIL/uL — ABNORMAL LOW (ref 4.22–5.81)
RDW: 18.6 % — ABNORMAL HIGH (ref 11.5–15.5)
WBC: 5.5 10*3/uL (ref 4.0–10.5)
nRBC: 0.4 % — ABNORMAL HIGH (ref 0.0–0.2)

## 2019-02-06 LAB — SAMPLE TO BLOOD BANK

## 2019-02-06 NOTE — Progress Notes (Signed)
Patient here for follow up. Pt voices no concerns.

## 2019-02-06 NOTE — Progress Notes (Signed)
Hematology/Oncology  [facility] Telephone:([phone] [phone]   Patient Care Team: Baxter Hire, MD as PCP - General (Internal Medicine) Earlie Server, MD as Consulting Physician (Oncology)  REFERRING PROVIDER: Baxter Hire, MD  CHIEF COMPLAINTS/REASON FOR VISIT:  Follow-up for anemia secondary to chronic kidney disease.  HISTORY OF PRESENTING ILLNESS:   Arthur [name] is a  86 y.o.  male with PMH listed below was seen in consultation at the request of  Baxter Hire, MD  for evaluation of anemia Patient has chronic comorbidities including diabetes, COPD/emphysema, chronic respiratory failure on home oxygen, CKD stage III, CHF, hypertension and hyperlipidemia. Reviewed patient's previous lab records. Extensive medical records review was performed by me. Anemia has been chronic, dated back to at least 2016.  At that time his hemoglobin was around 11-12. Hemoglobin started to trended down below 10 starting February 2020. 02/11/2018 hemoglobin 9, hematocrit 28.6, 06/17/2018 hemoglobin 7.3 hematocrit of 24, 06/27/2018 hemoglobin 5.8 07/15/2018 hemoglobin 7.5 07/29/2018 hemoglobin 8.2, 08/12/2018, hemoglobin 9.2   INTERVAL HISTORY Arthur [name] is a 86 y.o. male who has above history reviewed by me today presents for follow up visit for management of anemia Problems and complaints are listed below: Patient was accompanied by his daughter. He reports feeling pretty well today.  Chronic fatigue at baseline.  Not worse. Shortness of breath at baseline, on oxygen chronically.   Review of Systems  Constitutional: Positive for fatigue. Negative for appetite change, chills, fever and unexpected weight change.  HENT:   Negative for hearing loss and voice change.   Eyes: Negative for eye problems and icterus.  Respiratory: Negative for chest tightness, cough and shortness of breath.   Cardiovascular: Positive for leg swelling. Negative for chest pain.    Gastrointestinal: Negative for abdominal distention and abdominal pain.  Endocrine: Negative for hot flashes.  Genitourinary: Negative for difficulty urinating, dysuria and frequency.   Musculoskeletal: Positive for back pain. Negative for arthralgias.       Chronic back pain  Skin: Negative for itching and rash.  Neurological: Negative for light-headedness and numbness.  Hematological: Negative for adenopathy. Does not bruise/bleed easily.  Psychiatric/Behavioral: Negative for confusion.    MEDICAL HISTORY:  Past Medical History:  Diagnosis Date  . Anemia of chronic kidney failure 08/21/2018  . Arthritis    lower back  . CHF (congestive heart failure) ([place])   . CKD (chronic kidney disease), stage III   . COPD (chronic obstructive pulmonary disease) ([place])   . Diabetes mellitus without complication ([place])    type 2  . Dyspnea    when active -wears oxygen 2L via [place]  . History of blood transfusion   . Hyperlipidemia   . Hypertension   . Requires continuous at home supplemental oxygen    2L via [place]    SURGICAL HISTORY: Past Surgical History:  Procedure Laterality Date  . COLONOSCOPY WITH PROPOFOL N/A 06/11/2018   Procedure: COLONOSCOPY WITH PROPOFOL;  Surgeon: Jonathon Bellows, MD;  Location: [facility] ENDOSCOPY;  Service: Gastroenterology;  Laterality: N/A;  . COLONOSCOPY WITH PROPOFOL N/A 06/13/2018   Procedure: COLONOSCOPY WITH PROPOFOL;  Surgeon: Jonathon Bellows, MD;  Location: [facility] ENDOSCOPY;  Service: Gastroenterology;  Laterality: N/A;  . ESOPHAGOGASTRODUODENOSCOPY Left 06/10/2018   Procedure: ESOPHAGOGASTRODUODENOSCOPY (EGD);  Surgeon: Jonathon Bellows, MD;  Location: [facility] ENDOSCOPY;  Service: Gastroenterology;  Laterality: Left;  . EYE SURGERY     removed cataracts  . GIVENS CAPSULE STUDY N/A 06/28/2018   Procedure: GIVENS CAPSULE STUDY;  Surgeon: Lucilla Lame, MD;  Location: [facility] ENDOSCOPY;  Service: Endoscopy;  Laterality: N/A;  . GIVENS CAPSULE STUDY N/A 06/30/2018   Procedure: GIVENS  CAPSULE STUDY;  Surgeon: Jonathon Bellows, MD;  Location: [facility] ENDOSCOPY;  Service: Gastroenterology;  Laterality: N/A;  . KYPHOPLASTY N/A 10/31/2018   Procedure: KYPHOPLASTY L2;  Surgeon: [name], MD;  Location: [facility] ORS;  Service: Orthopedics;  Laterality: N/A;  . KYPHOPLASTY N/A 12/24/2018   Procedure: KYPHOPLASTY L3;  Surgeon: [name], MD;  Location: [facility] ORS;  Service: Orthopedics;  Laterality: N/A;  . RADIOLOGY WITH ANESTHESIA N/A 01/28/2019   Procedure: MRI LUMBER SPINE WITHOUT CONTRAST;  Surgeon: Radiologist, Medication, MD;  Location: [place];  Service: Radiology;  Laterality: N/A;    SOCIAL HISTORY: Social History   Socioeconomic History  . Marital status: Widowed    Spouse name: Not on file  . Number of children: Not on file  . Years of education: Not on file  . Highest education level: Not on file  Occupational History  . Not on file  Tobacco Use  . Smoking status: Former Smoker    Packs/day: 2.00    Years: 35.00    Pack years: 70.00    Types: Cigarettes    Quit date: 11/04/1982    Years since quitting: 36.2  . Smokeless tobacco: Current User    Types: Chew  Substance and Sexual Activity  . Alcohol use: Not Currently  . Drug use: No  . Sexual activity: Not Currently  Other Topics Concern  . Not on file  Social History Narrative  . Not on file   Social Determinants of Health   Financial Resource Strain: Low Risk   . Difficulty of Paying Living Expenses: Not hard at all  Food Insecurity: No Food Insecurity  . Worried About Charity fundraiser in the Last Year: Never true  . Ran Out of Food in the Last Year: Never true  Transportation Needs: No Transportation Needs  . Lack of Transportation (Medical): No  . Lack of Transportation (Non-Medical): No  Physical Activity: Unknown  . Days of Exercise per Week: 0 days  . Minutes of Exercise per Session: Not on file  Stress: No Stress Concern Present  . Feeling of Stress : Not at all  Social Connections:   .  Frequency of Communication with Friends and Family: Not on file  . Frequency of Social Gatherings with Friends and Family: Not on file  . Attends Religious Services: Not on file  . Active Member of Clubs or Organizations: Not on file  . Attends Archivist Meetings: Not on file  . Marital Status: Not on file  Intimate Partner Violence: Unknown  . Fear of Current or Ex-Partner: Patient refused  . Emotionally Abused: Patient refused  . Physically Abused: Patient refused  . Sexually Abused: Patient refused    FAMILY HISTORY: Family History  Problem Relation Age of Onset  . COPD Mother   . Cancer Father     ALLERGIES:  is allergic to codeine.  MEDICATIONS:  Current Outpatient Medications  Medication Sig Dispense Refill  . albuterol (VENTOLIN HFA) 108 (90 Base) MCG/ACT inhaler Inhale 2 puffs into the lungs every 6 (six) hours as needed for wheezing or shortness of breath. 1 Inhaler 2  . amLODipine (NORVASC) 2.5 MG tablet Take 2.5 mg by mouth daily.    Marland Kitchen ascorbic acid (VITAMIN C) 500 MG tablet Take 500 mg by mouth at bedtime.    . docusate sodium (COLACE) 100 MG  capsule Take 1 capsule (100 mg total) by mouth 2 (two) times daily. 60 capsule 0  . insulin aspart (NOVOLOG) 100 UNIT/ML injection Inject 5 Units into the skin 3 (three) times daily with meals. (Patient taking differently: Inject 15 Units into the skin 3 (three) times daily with meals. Skip dose if blood sugar is under 100) 10 mL 11  . ipratropium-albuterol (DUONEB) 0.5-2.5 (3) MG/3ML SOLN Take 3 mLs by nebulization 2 (two) times daily.     . iron polysaccharides (NIFEREX) 150 MG capsule Take 1 capsule (150 mg total) by mouth daily. (Patient taking differently: Take 150 mg by mouth every evening. ) 30 capsule 0  . LEVEMIR 100 UNIT/ML injection Inject 0.12 mLs (12 Units total) into the skin daily. (Patient taking differently: Inject 30 Units into the skin at bedtime. ) 10 mL 0  . pantoprazole (PROTONIX) 40 MG tablet Take 1  tablet (40 mg total) by mouth 2 (two) times daily. 60 tablet 0  . polyethylene glycol powder (GLYCOLAX/MIRALAX) 17 GM/SCOOP powder Take 17 g by mouth daily.    . potassium chloride (KLOR-CON) 10 MEQ tablet Take 10 mEq by mouth every evening.    . pravastatin (PRAVACHOL) 10 MG tablet Take 10 mg by mouth at bedtime.    . senna-docusate (SENOKOT-S) 8.6-50 MG tablet Take 2 tablets by mouth at bedtime.     . tamsulosin (FLOMAX) 0.4 MG CAPS capsule Take 0.4 mg by mouth daily.    Marland Kitchen torsemide (DEMADEX) 20 MG tablet Take 20 mg by mouth every Monday, Wednesday, and Friday.    Marland Kitchen HYDROcodone-acetaminophen (NORCO) 5-325 MG tablet Take 1 tablet by mouth every 6 (six) hours as needed. (Patient not taking: Reported on 01/15/2019) 20 tablet 0  . traMADol (ULTRAM) 50 MG tablet Take 25 mg by mouth 2 (two) times daily.      No current facility-administered medications for this visit.     PHYSICAL EXAMINATION: ECOG PERFORMANCE STATUS: 3 - Symptomatic, >50% confined to bed Vitals:   02/06/19 0845  BP: (!) 172/67  Pulse: 76  Resp: 18  Temp: 98.2 F (36.8 C)  SpO2: 99%   Filed Weights   02/06/19 0845  Weight: 204 lb 6.4 oz (92.7 kg)    Physical Exam Constitutional:      General: He is not in acute distress.    Appearance: He is ill-appearing.     Comments: Sits in wheel chair  HENT:     Head: Normocephalic and atraumatic.  Eyes:     General: No scleral icterus.    Pupils: Pupils are equal, round, and reactive to light.  Cardiovascular:     Rate and Rhythm: Normal rate and regular rhythm.     Heart sounds: Normal heart sounds.  Pulmonary:     Effort: Pulmonary effort is normal. No respiratory distress.     Breath sounds: No wheezing.     Comments: On nasal cannula oxygen. Bibasilar crackles. Abdominal:     General: Bowel sounds are normal. There is no distension.     Palpations: Abdomen is soft. There is no mass.     Tenderness: There is no abdominal tenderness.  Musculoskeletal:         General: No deformity. Normal range of motion.     Cervical back: Normal range of motion and neck supple.  Skin:    General: Skin is warm and dry.     Coloration: Skin is pale.     Findings: No erythema or rash.  Neurological:  Mental Status: He is alert and oriented to person, place, and time. Mental status is at baseline.     Cranial Nerves: No cranial nerve deficit.     Coordination: Coordination normal.  Psychiatric:        Mood and Affect: Mood normal.        Behavior: Behavior normal.        Thought Content: Thought content normal.     LABORATORY DATA:  I have reviewed the data as listed Lab Results  Component Value Date   WBC 5.5 02/06/2019   HGB 7.7 (L) 02/06/2019   HCT 26.1 (L) 02/06/2019   MCV 97.4 02/06/2019   PLT 376 02/06/2019   Recent Labs    08/07/18 1818 08/08/18 0542 10/27/18 1550 10/28/18 0500 10/29/18 0547 10/29/18 0547 10/31/18 1244 11/04/18 0547 01/23/19 0826 01/28/19 0828  NA 133*   < > 135   < > 136  --   --  134*  --  136  K 3.3*   < > 4.3   < > 4.2  --   --  3.7  --  4.2  CL 89*   < > 100   < > 102  --   --  97*  --  100  CO2 32   < > 22   < > 26  --   --  28  --  25  GLUCOSE 199*   < > 160*   < > 109*  --   --  107*  --  165*  BUN 45*   < > 33*   < > 31*  --   --  47*  --  35*  CREATININE 2.48*   < > 2.41*   < > 2.58*   < > 2.26* 2.28*  --  2.20*  CALCIUM 8.7*   < > 8.7*   < > 8.4*  --   --  8.0*  --  8.6*  GFRNONAA 23*   < > 24*   < > 22*   < > 26* 26*  --  27*  GFRAA 27*   < > 28*   < > 26*   < > 30* 30*  --  31*  PROT 8.0  --  7.4  --   --   --   --   --  7.8  --   ALBUMIN 3.8  --  3.9  --   --   --   --   --  3.2*  --   AST 14*  --  15  --   --   --   --   --  13*  --   ALT 12  --  13  --   --   --   --   --  12  --   ALKPHOS 65  --  69  --   --   --   --   --  77  --   BILITOT 0.9  --  1.0  --   --   --   --   --  0.6  --   BILIDIR  --   --   --   --   --   --   --   --  <0.1  --   IBILI  --   --   --   --   --   --   --   --   NOT CALCULATED  --    < > =  values in this interval not displayed.   Iron/TIBC/Ferritin/ %Sat    Component Value Date/Time   IRON 47 01/01/2019 1317   IRON 36 (L) 07/15/2018 1544   TIBC 175 (L) 01/01/2019 1317   TIBC 172 (L) 07/15/2018 1544   FERRITIN 410 (H) 01/01/2019 1317   FERRITIN 535 (H) 07/15/2018 1544   IRONPCTSAT 27 01/01/2019 1317   IRONPCTSAT 21 07/15/2018 1544      RADIOGRAPHIC STUDIES: I have personally reviewed the radiological images as listed and agreed with the findings in the report.  MR LUMBAR SPINE WO CONTRAST  Result Date: 01/28/2019 CLINICAL DATA:  Continue severe low back pain. Vertebroplasty 12/24/2018 EXAM: MRI LUMBAR SPINE WITHOUT CONTRAST TECHNIQUE: Multiplanar, multisequence MR imaging of the lumbar spine was performed. No intravenous contrast was administered. COMPARISON:  Lumbar MRI 12/28/2018 Anesthesia assisted study.  A diagnostic quality study was obtained. FINDINGS: Segmentation:  Normal Alignment:  Mild retrolisthesis L3-4, unchanged. Vertebrae: Kyphoplasty L2 for fracture. Fluid between the L2-3 disc and inferior endplate of L2. Cement shows adequate distribution. Mild bone marrow edema. No further fracture compared to the prior MRI Moderate compression fracture L3 with kyphoplasty. Mild bone marrow edema around the cement. Mild retropulsion of bone into the canal causing mild spinal stenosis. No fracture seen on the prior MRI. No other fractures in the lumbar spine. Negative for metastatic disease. Conus medullaris and cauda equina: Conus extends to the T12-L1 level. Conus and cauda equina appear normal. Paraspinal and other soft tissues: Cholelithiasis. No paraspinous mass or adenopathy. Disc levels: T12-L1: Negative L1-2: Diffuse disc bulging and facet degeneration causing mild spinal stenosis. No significant interval change. L2-3: Disc bulging and endplate spurring with moderate facet hypertrophy. Mild spinal stenosis, unchanged. L3-4: Diffuse disc  bulging with endplate spurring. Bilateral facet hypertrophy. Moderate spinal stenosis unchanged. Moderate subarticular stenosis bilaterally. L3 fracture with retropulsion of bone into the canal causing mild spinal stenosis at the upper L3 level. L4-5: Disc degeneration and spondylosis. Diffuse endplate spurring and bilateral facet degeneration. Mild spinal stenosis. Mild subarticular stenosis bilaterally L5-S1: Negative IMPRESSION: Kyphoplasty for fracture at L2. No further fracture since the prior MRI Moderate compression fracture L3 with kyphoplasty. Fracture not present on the prior MRI. Retropulsion of bone into the canal with mild spinal stenosis Multilevel spinal stenosis in the lumbar spine due to degenerative changes as described above. Electronically Signed   By: Franchot Gallo M.D.   On: 01/28/2019 16:38      ASSESSMENT & PLAN:  1. Anemia of chronic renal failure, stage 4 (severe) ([place])   2. CKD (chronic kidney disease) stage 4, GFR 15-29 ml/min ([facility])    #Anemia of chronic kidney disease, underlying MDS cannot be ruled out.  Patient declines bone marrow biopsy due to multiple other medical problems. Labs are reviewed and discussed with patient and daughter. His hemoglobin remained stable at 7.7 today.  Symptomatically he is doing well. Hold blood transfusion today. Patient will return in 2 weeks for repeat blood work and next Retacrit treatments.  Continue oral iron supplementation.  His reticulocyte panel showed mildly decreased reticulocyte hemoglobin.  I will repeat iron test at the next visit. # CKD, avoid nephrotoxins. All questions were answered. The patient knows to call the clinic with any problems questions or concerns.   Earlie Server, MD, PhD Hematology Oncology [facility] Pager- [phone] 02/06/2019

## 2019-02-10 DIAGNOSIS — S32030D Wedge compression fracture of third lumbar vertebra, subsequent encounter for fracture with routine healing: Secondary | ICD-10-CM | POA: Diagnosis not present

## 2019-02-10 DIAGNOSIS — S32020D Wedge compression fracture of second lumbar vertebra, subsequent encounter for fracture with routine healing: Secondary | ICD-10-CM | POA: Diagnosis not present

## 2019-02-11 ENCOUNTER — Other Ambulatory Visit: Payer: Self-pay | Admitting: Orthopedic Surgery

## 2019-02-13 ENCOUNTER — Encounter
Admission: RE | Admit: 2019-02-13 | Discharge: 2019-02-13 | Disposition: A | Discharge status: Home / Self Care | Payer: Medicare HMO | Source: Ambulatory Visit | Attending: Orthopedic Surgery | Admitting: Orthopedic Surgery

## 2019-02-13 NOTE — Pre-Procedure Instructions (Signed)
Earlie Server, MD  Physician  Oncology     Progress Notes     Signed     Encounter Date:  02/06/2019                  Signed          Expand AllCollapse All            Expand widget buttonCollapse widget button    Show:Clear all   ManualTemplateCopied  Added by:     Earlie Server, MD   Hover for detailscustomization button                                                                                                                        untitled image  Hematology/Oncology   [facility]  [phone] [phone]        Patient Care Team:  Baxter Hire, MD as PCP - General (Internal Medicine)  Earlie Server, MD as Consulting Physician (Oncology)     REFERRING PROVIDER:  Baxter Hire, MD   CHIEF COMPLAINTS/REASON FOR VISIT:   Follow-up for anemia secondary to chronic kidney disease.     HISTORY OF PRESENTING ILLNESS:      Arthur [name] is a  86 y.o.  male with PMH listed below was seen in consultation at the request of  Baxter Hire, MD  for evaluation of anemia  Patient has chronic comorbidities including diabetes, COPD/emphysema, chronic respiratory failure on home oxygen, CKD stage III, CHF, hypertension and hyperlipidemia.  Reviewed patient's previous lab records.  Extensive medical records review was performed by me.  Anemia has been chronic, dated back to at least 2016.  At that time his hemoglobin was around 11-12.  Hemoglobin started to trended down below 10 starting February 2020.  02/11/2018 hemoglobin 9, hematocrit 28.6,  06/17/2018 hemoglobin 7.3 hematocrit of 24,  06/27/2018 hemoglobin 5.8  07/15/2018 hemoglobin 7.5  07/29/2018 hemoglobin 8.2,  08/12/2018, hemoglobin 9.2        INTERVAL HISTORY  Arthur [name] is a 86 y.o. male who has  above history reviewed by me today presents for follow up visit for management of anemia  Problems and complaints are listed below:  Patient was accompanied by his daughter.  He reports feeling pretty well today.  Chronic fatigue at baseline.  Not worse.  Shortness of breath at baseline, on oxygen chronically.        Review of Systems   Constitutional: Positive for fatigue. Negative for appetite change, chills, fever and unexpected weight change.   HENT:   Negative for hearing loss and voice change.    Eyes: Negative for eye problems and icterus.   Respiratory: Negative for chest tightness, cough and shortness of breath.    Cardiovascular: Positive for leg swelling. Negative for chest pain.   Gastrointestinal: Negative for abdominal distention and abdominal pain.   Endocrine: Negative for hot flashes.   Genitourinary: Negative for difficulty urinating, dysuria and  frequency.    Musculoskeletal: Positive for back pain. Negative for arthralgias.        Chronic back pain   Skin: Negative for itching and rash.   Neurological: Negative for light-headedness and numbness.   Hematological: Negative for adenopathy. Does not bruise/bleed easily.   Psychiatric/Behavioral: Negative for confusion.         MEDICAL HISTORY:        Past Medical History:    Diagnosis   Date    .   Anemia of chronic kidney failure   08/21/2018    .   Arthritis            lower back    .   CHF (congestive heart failure) ([place])        .   CKD (chronic kidney disease), stage III        .   COPD (chronic obstructive pulmonary disease) ([place])        .   Diabetes mellitus without complication ([place])            type 2    .   Dyspnea            when active -wears oxygen 2L via [place]    .   History of blood transfusion        .   Hyperlipidemia        .   Hypertension        .   Requires continuous at home supplemental  oxygen            2L via [place]          SURGICAL HISTORY:        Past Surgical History:    Procedure   Laterality   Date    .   COLONOSCOPY WITH PROPOFOL   N/A   06/11/2018        Procedure: COLONOSCOPY WITH PROPOFOL;  Surgeon: Jonathon Bellows, MD;  Location: [facility] ENDOSCOPY;  Service: Gastroenterology;  Laterality: N/A;    .   COLONOSCOPY WITH PROPOFOL   N/A   06/13/2018        Procedure: COLONOSCOPY WITH PROPOFOL;  Surgeon: Jonathon Bellows, MD;  Location: [facility] ENDOSCOPY;  Service: Gastroenterology;  Laterality: N/A;    .   ESOPHAGOGASTRODUODENOSCOPY   Left   06/10/2018        Procedure: ESOPHAGOGASTRODUODENOSCOPY (EGD);  Surgeon: Jonathon Bellows, MD;  Location[facility] ENDOSCOPY;  Service: Gastroenterology;  Laterality: Left;    .   EYE SURGERY                removed cataracts    .   GIVENS CAPSULE STUDY   N/A   06/28/2018        Procedure: GIVENS CAPSULE STUDY;  Surgeon: Lucilla Lame, MD;  Location: [facility] ENDOSCOPY;  Service: Endoscopy;  Laterality: N/A;    .   GIVENS CAPSULE STUDY   N/A   06/30/2018        Procedure: GIVENS CAPSULE STUDY;  Surgeon: Jonathon Bellows, MD;  Location: [facility] ENDOSCOPY;  Service: Gastroenterology;  Laterality: N/A;    .   KYPHOPLASTY   N/A   10/31/2018        Procedure: KYPHOPLASTY L2;  Surgeon: [name], MD;  Location: [facility] ORS;  Service: Orthopedics;  Laterality: N/A;    .   KYPHOPLASTY   N/A   12/24/2018        Procedure: KYPHOPLASTY L3;  Surgeon: [name], MD;  Location:  [facility] ORS;  Service: Orthopedics;  Laterality: N/A;    .   RADIOLOGY WITH ANESTHESIA   N/A   01/28/2019        Procedure: MRI LUMBER SPINE WITHOUT CONTRAST;  Surgeon: Radiologist, Medication, MD;  Location: [place];  Service: Radiology;  Laterality: N/A;          SOCIAL HISTORY:   Social History             Socioeconomic History    .   Marital status:   Widowed             Spouse name:   Not on file    .   Number of children:   Not on file    .   Years of education:   Not on file    .   Highest education level:   Not on file    Occupational History    .   Not on file    Tobacco Use    .   Smoking status:   Former Smoker            Packs/day:   2.00            Years:   35.00            Pack years:   70.00            Types:   Cigarettes            Quit date:   11/04/1982            Years since quitting:   36.2    .   Smokeless tobacco:   Current User            Types:   Chew    Substance and Sexual Activity    .   Alcohol use:   Not Currently    .   Drug use:   No    .   Sexual activity:   Not Currently    Other Topics   Concern    .   Not on file    Social History Narrative    .   Not on file        Social Determinants of Health           Financial Resource Strain: Low Risk     .   Difficulty of Paying Living Expenses: Not hard at all    Food Insecurity: No Food Insecurity    .   Worried About Charity fundraiser in the Last Year: Never true    .   Ran Out of Food in the Last Year: Never true    Transportation Needs: No Transportation Needs    .   Lack of Transportation (Medical): No    .   Lack of Transportation (Non-Medical): No    Physical Activity: Unknown    .   Days of Exercise per Week: 0 days    .   Minutes of Exercise per Session: Not on file    Stress: No Stress Concern Present    .   Feeling of Stress : Not at all    Social Connections:     .   Frequency of Communication with Friends and Family: Not on file    .   Frequency of Social Gatherings with Friends and Family: Not on file    .   Attends Religious Services: Not on file    .  Active Member of Clubs or Organizations: Not on file    .   Attends Archivist  Meetings: Not on file    .   Marital Status: Not on file    Intimate Partner Violence: Unknown    .   Fear of Current or Ex-Partner: Patient refused    .   Emotionally Abused: Patient refused    .   Physically Abused: Patient refused    .   Sexually Abused: Patient refused          FAMILY HISTORY:        Family History    Problem   Relation   Age of Onset    .   COPD   Mother        .   Cancer   Father              ALLERGIES:  is allergic to codeine.     MEDICATIONS:          Current Outpatient Medications    Medication   Sig   Dispense   Refill    .   albuterol (VENTOLIN HFA) 108 (90 Base) MCG/ACT inhaler   Inhale 2 puffs into the lungs every 6 (six) hours as needed for wheezing or shortness of breath.   1 Inhaler   2    .   amLODipine (NORVASC) 2.5 MG tablet   Take 2.5 mg by mouth daily.            Marland Kitchen   ascorbic acid (VITAMIN C) 500 MG tablet   Take 500 mg by mouth at bedtime.            .   docusate sodium (COLACE) 100 MG capsule   Take 1 capsule (100 mg total) by mouth 2 (two) times daily.   60 capsule   0    .   insulin aspart (NOVOLOG) 100 UNIT/ML injection   Inject 5 Units into the skin 3 (three) times daily with meals. (Patient taking differently: Inject 15 Units into the skin 3 (three) times daily with meals. Skip dose if blood sugar is under 100)   10 mL   11    .   ipratropium-albuterol (DUONEB) 0.5-2.5 (3) MG/3ML SOLN   Take 3 mLs by nebulization 2 (two) times daily.             .   iron polysaccharides (NIFEREX) 150 MG capsule   Take 1 capsule (150 mg total) by mouth daily. (Patient taking differently: Take 150 mg by mouth every evening. )   30 capsule   0    .   LEVEMIR 100 UNIT/ML injection   Inject 0.12 mLs (12 Units total) into the skin daily. (Patient taking differently: Inject 30 Units into the skin at bedtime. )   10 mL   0     .   pantoprazole (PROTONIX) 40 MG tablet   Take 1 tablet (40 mg total) by mouth 2 (two) times daily.   60 tablet   0    .   polyethylene glycol powder (GLYCOLAX/MIRALAX) 17 GM/SCOOP powder   Take 17 g by mouth daily.            .   potassium chloride (KLOR-CON) 10 MEQ tablet   Take 10 mEq by mouth every evening.            .   pravastatin (PRAVACHOL) 10 MG tablet   Take 10 mg by mouth  at bedtime.            .   senna-docusate (SENOKOT-S) 8.6-50 MG tablet   Take 2 tablets by mouth at bedtime.             .   tamsulosin (FLOMAX) 0.4 MG CAPS capsule   Take 0.4 mg by mouth daily.            Marland Kitchen   torsemide (DEMADEX) 20 MG tablet   Take 20 mg by mouth every Monday, Wednesday, and Friday.            Marland Kitchen   HYDROcodone-acetaminophen (NORCO) 5-325 MG tablet   Take 1 tablet by mouth every 6 (six) hours as needed. (Patient not taking: Reported on 01/15/2019)   20 tablet   0    .   traMADol (ULTRAM) 50 MG tablet   Take 25 mg by mouth 2 (two) times daily.                 No current facility-administered medications for this visit.             PHYSICAL EXAMINATION:  ECOG PERFORMANCE STATUS: 3 - Symptomatic, >50% confined to bed      Vitals:        02/06/19 0845    BP:   (!) 172/67    Pulse:   76    Resp:   18    Temp:   98.2 F (36.8 C)    SpO2:   99%           Filed Weights        02/06/19 0845    Weight:   204 lb 6.4 oz (92.7 kg)          Physical Exam  Constitutional:       General: He is not in acute distress.    Appearance: He is ill-appearing.     Comments: Sits in wheel chair  HENT:      Head: Normocephalic and atraumatic.  Eyes:      General: No scleral icterus.    Pupils: Pupils are equal, round, and reactive to light.  Cardiovascular:      Rate and Rhythm: Normal rate and regular rhythm.     Heart sounds: Normal heart sounds.   Pulmonary:      Effort: Pulmonary effort is normal. No respiratory distress.     Breath sounds: No wheezing.     Comments: On nasal cannula oxygen. Bibasilar crackles. Abdominal:     General: Bowel sounds are normal. There is no distension.     Palpations: Abdomen is soft. There is no mass.     Tenderness: There is no abdominal tenderness.   Musculoskeletal:         General: No deformity. Normal range of motion.     Cervical back: Normal range of motion and neck supple.   Skin:     General: Skin is warm and dry.     Coloration: Skin is pale.     Findings: No erythema or rash.  Neurological:      Mental Status: He is alert and oriented to person, place, and time. Mental status is at baseline.     Cranial Nerves: No cranial nerve deficit.     Coordination: Coordination normal.  Psychiatric:        Mood and Affect: Mood normal.        Behavior: Behavior normal.        Thought Content: Thought content  normal.            LABORATORY DATA:   I have reviewed the data as listed  Recent Labs                                                                                              Recent Labs (within last 365 days)  Iron/TIBC/Ferritin/ %Sat  Labs (Brief)                                                                                                                                           RADIOGRAPHIC STUDIES:  I have personally reviewed the radiological images as listed and agreed with the findings in the report.       Imaging Results                          ASSESSMENT & PLAN:    1.   Anemia of chronic renal failure, stage 4 (severe) ([place])     2.   CKD (chronic kidney disease) stage 4, GFR 15-29 ml/min (HCC)        #Anemia of chronic kidney disease, underlying MDS cannot be ruled out.  Patient declines bone marrow biopsy due to multiple other medical problems.  Labs are reviewed and discussed with patient and daughter.  His hemoglobin remained stable at 7.7 today.  Symptomatically he is doing well.  Hold blood transfusion today.  Patient will return in 2 weeks for repeat blood work and next Retacrit treatments.     Continue oral iron supplementation.  His reticulocyte panel showed mildly decreased reticulocyte hemoglobin.  I will repeat iron test at the next visit.  # CKD, avoid nephrotoxins.  All questions were answered. The patient knows to call the clinic with any problems questions or concerns.        Earlie Server, MD, PhD  Hematology Oncology  [facility] [place]-  [phone]  02/06/2019             Electronically signed by Earlie Server, MD at 02/06/2019 12:22 PM             Office Visit on 02/06/2019               Detailed Report             Note shared with patient

## 2019-02-14 ENCOUNTER — Encounter
Admission: RE | Admit: 2019-02-14 | Discharge: 2019-02-14 | Disposition: A | Discharge status: Home / Self Care | Payer: Medicare HMO | Source: Ambulatory Visit | Attending: Orthopedic Surgery | Admitting: Orthopedic Surgery

## 2019-02-14 ENCOUNTER — Other Ambulatory Visit: Payer: Self-pay

## 2019-02-14 DIAGNOSIS — Z01818 Encounter for other preprocedural examination: Secondary | ICD-10-CM | POA: Diagnosis not present

## 2019-02-14 DIAGNOSIS — S32020D Wedge compression fracture of second lumbar vertebra, subsequent encounter for fracture with routine healing: Secondary | ICD-10-CM | POA: Diagnosis not present

## 2019-02-14 DIAGNOSIS — Z20822 Contact with and (suspected) exposure to covid-19: Secondary | ICD-10-CM | POA: Insufficient documentation

## 2019-02-14 DIAGNOSIS — I10 Essential (primary) hypertension: Secondary | ICD-10-CM | POA: Diagnosis not present

## 2019-02-14 NOTE — Patient Instructions (Signed)
Your procedure is scheduled on: Feb 18, 2019 Tuesday Report to Day Surgery on the 2nd floor of the [facility]. To find out your arrival time, please call [phone] between 1PM - 3PM on: Monday Feb 17, 2019  REMEMBER: Instructions that are not followed completely may result in serious medical risk, up to and including death; or upon the discretion of your surgeon and anesthesiologist your surgery may need to be rescheduled.  Do not eat food after midnight the night before surgery.  No gum chewing, lozengers or hard candies.  You may however, drink CLEAR liquids up to 2 hours before you are scheduled to arrive for your surgery. Do not drink anything within 2 hours of the start of your surgery.  Clear liquids include: - water     Do NOT drink anything that is not on this list.  Type 1 and Type 2 diabetics should only drink water.  Gatorade PRE-SURGERY  DRINK:  Complete drinking 2 hours prior to arrival time at the hospital.  No Alcohol for 24 hours before or after surgery.  No Smoking including e-cigarettes for 24 hours prior to surgery.  No chewable tobacco products for at least 6 hours prior to surgery.  No nicotine patches on the day of surgery.  On the morning of surgery brush your teeth with toothpaste and water, you may rinse your mouth with mouthwash if you wish. Do not swallow any toothpaste or mouthwash.  Notify your doctor if there is any change in your medical condition (cold, fever, infection).  Do not wear jewelry, make-up, hairpins, clips or nail polish.  Do not wear lotions, powders, or perfumes.   Do not shave 48 hours prior to surgery.   Contacts and dentures may not be worn into surgery.  Do not bring valuables to the hospital, including drivers license, insurance or credit cards.  [facility] is not responsible for any belongings or valuables.   TAKE THESE MEDICATIONS THE MORNING OF SURGERY: Amlodipine tamsulosin pantoprazole (take one the night  before and one on the morning of surgery - helps to prevent nausea after surgery.)  Use CHG Soap  as directed on instruction sheet.  Use inhalers on the day of surgery   Take 1/2 of usual insulin dose the night before surgery and none on the morning of surgery.  Follow recommendations from Cardiologist, Pulmonologist or PCP regarding stopping Aspirin, Coumadin, Plavix, Eliquis, Pradaxa, or Pletal.  Stop Anti-inflammatories (NSAIDS) such as Advil, Aleve, Ibuprofen, Motrin, Naproxen, Naprosyn and Aspirin based products such as Excedrin, Goodys Powder, BC Powder. (May take Tylenol or Acetaminophen if needed.)  Stop ANY OVER THE COUNTER supplements until after surgery. (May continue Vitamin D, Vitamin B, and multivitamin.)  Wear comfortable clothing (specific to your surgery type) to the hospital.  Plan for stool softeners for home use.  If you are being discharged the day of surgery, you will not be allowed to drive home. You will need a responsible adult to drive you home and stay with you that night.   If you are taking public transportation, you will need to have a responsible adult with you. Please confirm with your physician that it is acceptable to use public transportation.   Please call [phone] if you have any questions about these instructions.

## 2019-02-15 LAB — SARS CORONAVIRUS 2 (TAT 6-24 HRS): SARS Coronavirus 2: NEGATIVE

## 2019-02-18 ENCOUNTER — Ambulatory Visit: Payer: Medicare HMO | Admitting: Anesthesiology

## 2019-02-18 ENCOUNTER — Other Ambulatory Visit: Payer: Self-pay

## 2019-02-18 ENCOUNTER — Observation Stay
Admission: RE | Admit: 2019-02-18 | Discharge: 2019-02-20 | Disposition: A | Discharge status: Skilled Nursing Facility | Payer: Medicare HMO | Attending: Orthopedic Surgery | Admitting: Orthopedic Surgery

## 2019-02-18 ENCOUNTER — Ambulatory Visit: Payer: Medicare HMO

## 2019-02-18 ENCOUNTER — Encounter: Payer: Self-pay | Admitting: Orthopedic Surgery

## 2019-02-18 ENCOUNTER — Encounter
Admission: RE | Disposition: A | Discharge status: Skilled Nursing Facility | Payer: Self-pay | Source: Home / Self Care | Attending: Orthopedic Surgery

## 2019-02-18 DIAGNOSIS — Z89021 Acquired absence of right finger(s): Secondary | ICD-10-CM | POA: Insufficient documentation

## 2019-02-18 DIAGNOSIS — E785 Hyperlipidemia, unspecified: Secondary | ICD-10-CM | POA: Diagnosis not present

## 2019-02-18 DIAGNOSIS — E1122 Type 2 diabetes mellitus with diabetic chronic kidney disease: Secondary | ICD-10-CM | POA: Insufficient documentation

## 2019-02-18 DIAGNOSIS — Z20822 Contact with and (suspected) exposure to covid-19: Secondary | ICD-10-CM | POA: Diagnosis not present

## 2019-02-18 DIAGNOSIS — I509 Heart failure, unspecified: Secondary | ICD-10-CM | POA: Diagnosis not present

## 2019-02-18 DIAGNOSIS — Z87891 Personal history of nicotine dependence: Secondary | ICD-10-CM | POA: Insufficient documentation

## 2019-02-18 DIAGNOSIS — Z9981 Dependence on supplemental oxygen: Secondary | ICD-10-CM | POA: Insufficient documentation

## 2019-02-18 DIAGNOSIS — Z794 Long term (current) use of insulin: Secondary | ICD-10-CM | POA: Diagnosis not present

## 2019-02-18 DIAGNOSIS — N400 Enlarged prostate without lower urinary tract symptoms: Secondary | ICD-10-CM | POA: Insufficient documentation

## 2019-02-18 DIAGNOSIS — Z6831 Body mass index (BMI) 31.0-31.9, adult: Secondary | ICD-10-CM | POA: Insufficient documentation

## 2019-02-18 DIAGNOSIS — Z79899 Other long term (current) drug therapy: Secondary | ICD-10-CM | POA: Insufficient documentation

## 2019-02-18 DIAGNOSIS — S32030A Wedge compression fracture of third lumbar vertebra, initial encounter for closed fracture: Secondary | ICD-10-CM | POA: Diagnosis not present

## 2019-02-18 DIAGNOSIS — J449 Chronic obstructive pulmonary disease, unspecified: Secondary | ICD-10-CM | POA: Diagnosis not present

## 2019-02-18 DIAGNOSIS — Z4789 Encounter for other orthopedic aftercare: Secondary | ICD-10-CM | POA: Diagnosis not present

## 2019-02-18 DIAGNOSIS — D631 Anemia in chronic kidney disease: Secondary | ICD-10-CM | POA: Insufficient documentation

## 2019-02-18 DIAGNOSIS — Z419 Encounter for procedure for purposes other than remedying health state, unspecified: Secondary | ICD-10-CM

## 2019-02-18 DIAGNOSIS — S32020A Wedge compression fracture of second lumbar vertebra, initial encounter for closed fracture: Principal | ICD-10-CM | POA: Insufficient documentation

## 2019-02-18 DIAGNOSIS — I130 Hypertensive heart and chronic kidney disease with heart failure and stage 1 through stage 4 chronic kidney disease, or unspecified chronic kidney disease: Secondary | ICD-10-CM | POA: Insufficient documentation

## 2019-02-18 DIAGNOSIS — X58XXXA Exposure to other specified factors, initial encounter: Secondary | ICD-10-CM | POA: Diagnosis not present

## 2019-02-18 DIAGNOSIS — G4733 Obstructive sleep apnea (adult) (pediatric): Secondary | ICD-10-CM | POA: Insufficient documentation

## 2019-02-18 DIAGNOSIS — Z9889 Other specified postprocedural states: Secondary | ICD-10-CM

## 2019-02-18 DIAGNOSIS — N184 Chronic kidney disease, stage 4 (severe): Secondary | ICD-10-CM | POA: Insufficient documentation

## 2019-02-18 HISTORY — PX: KYPHOPLASTY: SHX5884

## 2019-02-18 LAB — GLUCOSE, CAPILLARY
Glucose-Capillary: 133 mg/dL — ABNORMAL HIGH (ref 70–99)
Glucose-Capillary: 143 mg/dL — ABNORMAL HIGH (ref 70–99)
Glucose-Capillary: 223 mg/dL — ABNORMAL HIGH (ref 70–99)

## 2019-02-18 LAB — HEMOGLOBIN A1C
Hgb A1c MFr Bld: 6.7 % — ABNORMAL HIGH (ref 4.8–5.6)
Mean Plasma Glucose: 145.59 mg/dL

## 2019-02-18 SURGERY — KYPHOPLASTY
Anesthesia: General

## 2019-02-18 MED ORDER — FENTANYL CITRATE (PF) 100 MCG/2ML IJ SOLN
INTRAMUSCULAR | Status: AC
  Filled 2019-02-18: qty 2

## 2019-02-18 MED ORDER — ONDANSETRON HCL 4 MG PO TABS: 4.0000 mg | ORAL_TABLET | Freq: Four times a day (QID) | ORAL | Status: DC | PRN

## 2019-02-18 MED ORDER — MAGNESIUM CITRATE PO SOLN
1.0000 | Freq: Once | ORAL | Status: DC | PRN
  Filled 2019-02-18: qty 296

## 2019-02-18 MED ORDER — SODIUM CHLORIDE 0.9 % IV SOLN: INTRAVENOUS | Status: DC

## 2019-02-18 MED ORDER — INSULIN DETEMIR 100 UNIT/ML ~~LOC~~ SOLN
25.0000 [IU] | Freq: Every day | SUBCUTANEOUS | Status: DC
  Administered 2019-02-18 – 2019-02-19 (×2): 25 [IU] via SUBCUTANEOUS
  Filled 2019-02-18 (×3): qty 0.25

## 2019-02-18 MED ORDER — ALBUTEROL SULFATE (2.5 MG/3ML) 0.083% IN NEBU: 2.5000 mg | INHALATION_SOLUTION | Freq: Four times a day (QID) | RESPIRATORY_TRACT | Status: DC | PRN

## 2019-02-18 MED ORDER — POLYSACCHARIDE IRON COMPLEX 150 MG PO CAPS
150.0000 mg | ORAL_CAPSULE | Freq: Every evening | ORAL | Status: DC
  Administered 2019-02-18 – 2019-02-19 (×2): 150 mg via ORAL
  Filled 2019-02-18 (×3): qty 1

## 2019-02-18 MED ORDER — IPRATROPIUM-ALBUTEROL 0.5-2.5 (3) MG/3ML IN SOLN
3.0000 mL | Freq: Two times a day (BID) | RESPIRATORY_TRACT | Status: DC
  Administered 2019-02-18 – 2019-02-20 (×4): 3 mL via RESPIRATORY_TRACT
  Filled 2019-02-18 (×4): qty 3

## 2019-02-18 MED ORDER — FENTANYL CITRATE (PF) 100 MCG/2ML IJ SOLN
25.0000 ug | INTRAMUSCULAR | Status: DC | PRN
  Administered 2019-02-18: 25 ug via INTRAVENOUS

## 2019-02-18 MED ORDER — PROPOFOL 10 MG/ML IV BOLUS
INTRAVENOUS | Status: AC
  Filled 2019-02-18: qty 20

## 2019-02-18 MED ORDER — BISACODYL 10 MG RE SUPP: 10.0000 mg | Freq: Every day | RECTAL | Status: DC | PRN

## 2019-02-18 MED ORDER — METOCLOPRAMIDE HCL 5 MG/ML IJ SOLN: 5.0000 mg | Freq: Three times a day (TID) | INTRAMUSCULAR | Status: DC | PRN

## 2019-02-18 MED ORDER — METHYLPREDNISOLONE SODIUM SUCC 40 MG IJ SOLR
40.0000 mg | Freq: Once | INTRAMUSCULAR | Status: AC
  Administered 2019-02-18: 40 mg via INTRAVENOUS
  Filled 2019-02-18: qty 1

## 2019-02-18 MED ORDER — PANTOPRAZOLE SODIUM 40 MG PO TBEC
40.0000 mg | DELAYED_RELEASE_TABLET | Freq: Two times a day (BID) | ORAL | Status: DC
  Administered 2019-02-18 – 2019-02-20 (×4): 40 mg via ORAL
  Filled 2019-02-18 (×4): qty 1

## 2019-02-18 MED ORDER — ONDANSETRON HCL 4 MG/2ML IJ SOLN: 4.0000 mg | Freq: Once | INTRAMUSCULAR | Status: DC | PRN

## 2019-02-18 MED ORDER — TAMSULOSIN HCL 0.4 MG PO CAPS
0.4000 mg | ORAL_CAPSULE | Freq: Every day | ORAL | Status: DC
  Administered 2019-02-19 – 2019-02-20 (×2): 0.4 mg via ORAL
  Filled 2019-02-18 (×2): qty 1

## 2019-02-18 MED ORDER — ONDANSETRON HCL 4 MG/2ML IJ SOLN: 4.0000 mg | Freq: Four times a day (QID) | INTRAMUSCULAR | Status: DC | PRN

## 2019-02-18 MED ORDER — HYDROCODONE-ACETAMINOPHEN 7.5-325 MG PO TABS
1.0000 | ORAL_TABLET | ORAL | Status: DC | PRN
  Administered 2019-02-20: 2 via ORAL
  Filled 2019-02-18: qty 2

## 2019-02-18 MED ORDER — TORSEMIDE 20 MG PO TABS
20.0000 mg | ORAL_TABLET | ORAL | Status: DC
  Administered 2019-02-19: 09:00:00 20 mg via ORAL
  Filled 2019-02-18: qty 1

## 2019-02-18 MED ORDER — CEFAZOLIN SODIUM-DEXTROSE 2-4 GM/100ML-% IV SOLN
INTRAVENOUS | Status: AC
  Filled 2019-02-18: qty 100

## 2019-02-18 MED ORDER — FENTANYL CITRATE (PF) 100 MCG/2ML IJ SOLN
INTRAMUSCULAR | Status: DC | PRN
  Administered 2019-02-18: 25 ug via INTRAVENOUS

## 2019-02-18 MED ORDER — PROPOFOL 10 MG/ML IV BOLUS
INTRAVENOUS | Status: DC | PRN
  Administered 2019-02-18: 20 ug via INTRAVENOUS
  Administered 2019-02-18 (×2): 10 ug via INTRAVENOUS

## 2019-02-18 MED ORDER — ASCORBIC ACID 500 MG PO TABS
500.0000 mg | ORAL_TABLET | Freq: Every day | ORAL | Status: DC
  Administered 2019-02-18 – 2019-02-19 (×2): 500 mg via ORAL
  Filled 2019-02-18 (×2): qty 1

## 2019-02-18 MED ORDER — ACETAMINOPHEN 500 MG PO TABS
500.0000 mg | ORAL_TABLET | Freq: Four times a day (QID) | ORAL | Status: AC
  Administered 2019-02-18 – 2019-02-19 (×4): 500 mg via ORAL
  Filled 2019-02-18 (×4): qty 1

## 2019-02-18 MED ORDER — ACETAMINOPHEN 325 MG PO TABS: 325.0000 mg | ORAL_TABLET | Freq: Four times a day (QID) | ORAL | Status: DC | PRN

## 2019-02-18 MED ORDER — BUPIVACAINE-EPINEPHRINE (PF) 0.5% -1:200000 IJ SOLN
INTRAMUSCULAR | Status: DC | PRN
  Administered 2019-02-18: 10 mL via PERINEURAL

## 2019-02-18 MED ORDER — DOCUSATE SODIUM 100 MG PO CAPS
100.0000 mg | ORAL_CAPSULE | Freq: Two times a day (BID) | ORAL | Status: DC
  Administered 2019-02-18 – 2019-02-20 (×3): 100 mg via ORAL
  Filled 2019-02-18 (×5): qty 1

## 2019-02-18 MED ORDER — AMLODIPINE BESYLATE 5 MG PO TABS
2.5000 mg | ORAL_TABLET | Freq: Every day | ORAL | Status: DC
  Administered 2019-02-19 – 2019-02-20 (×2): 2.5 mg via ORAL
  Filled 2019-02-18 (×2): qty 1

## 2019-02-18 MED ORDER — METOCLOPRAMIDE HCL 10 MG PO TABS: 5.0000 mg | ORAL_TABLET | Freq: Three times a day (TID) | ORAL | Status: DC | PRN

## 2019-02-18 MED ORDER — CEFAZOLIN SODIUM-DEXTROSE 2-4 GM/100ML-% IV SOLN
2.0000 g | INTRAVENOUS | Status: AC
  Administered 2019-02-18: 2 g via INTRAVENOUS

## 2019-02-18 MED ORDER — INSULIN ASPART 100 UNIT/ML ~~LOC~~ SOLN: 0.0000 [IU] | Freq: Three times a day (TID) | SUBCUTANEOUS | Status: DC

## 2019-02-18 MED ORDER — INSULIN ASPART 100 UNIT/ML ~~LOC~~ SOLN
0.0000 [IU] | Freq: Three times a day (TID) | SUBCUTANEOUS | Status: DC
  Administered 2019-02-19: 5 [IU] via SUBCUTANEOUS
  Administered 2019-02-19 (×2): 3 [IU] via SUBCUTANEOUS
  Filled 2019-02-18 (×3): qty 1

## 2019-02-18 MED ORDER — MAGNESIUM HYDROXIDE 400 MG/5ML PO SUSP: 30.0000 mL | Freq: Every day | ORAL | Status: DC | PRN

## 2019-02-18 MED ORDER — DOCUSATE SODIUM 100 MG PO CAPS
100.0000 mg | ORAL_CAPSULE | ORAL | Status: DC
  Administered 2019-02-18 – 2019-02-20 (×2): 100 mg via ORAL
  Filled 2019-02-18 (×2): qty 1

## 2019-02-18 MED ORDER — MORPHINE SULFATE (PF) 2 MG/ML IV SOLN: 0.5000 mg | INTRAVENOUS | Status: DC | PRN

## 2019-02-18 MED ORDER — METHOCARBAMOL 500 MG PO TABS: 500.0000 mg | ORAL_TABLET | Freq: Four times a day (QID) | ORAL | Status: DC | PRN

## 2019-02-18 MED ORDER — PRAVASTATIN SODIUM 20 MG PO TABS
10.0000 mg | ORAL_TABLET | Freq: Every day | ORAL | Status: DC
  Administered 2019-02-18 – 2019-02-19 (×2): 10 mg via ORAL
  Filled 2019-02-18 (×2): qty 1

## 2019-02-18 MED ORDER — LIDOCAINE HCL 1 % IJ SOLN
INTRAMUSCULAR | Status: DC | PRN
  Administered 2019-02-18: 10 mL

## 2019-02-18 MED ORDER — IOPAMIDOL (ISOVUE-M 200) INJECTION 41%
INTRAMUSCULAR | Status: DC | PRN
  Administered 2019-02-18: 16:00:00 10 mL

## 2019-02-18 MED ORDER — HYDROCODONE-ACETAMINOPHEN 5-325 MG PO TABS: 1.0000 | ORAL_TABLET | ORAL | Status: DC | PRN

## 2019-02-18 MED ORDER — MIDAZOLAM HCL 2 MG/2ML IJ SOLN
INTRAMUSCULAR | Status: AC
  Filled 2019-02-18: qty 2

## 2019-02-18 MED ORDER — POLYETHYLENE GLYCOL 3350 17 G PO PACK
17.0000 g | PACK | ORAL | Status: DC
  Administered 2019-02-18 – 2019-02-20 (×2): 17 g via ORAL
  Filled 2019-02-18 (×3): qty 1

## 2019-02-18 MED ORDER — MIDAZOLAM HCL 2 MG/2ML IJ SOLN
INTRAMUSCULAR | Status: DC | PRN
  Administered 2019-02-18: .5 mg via INTRAVENOUS

## 2019-02-18 MED ORDER — CHLORHEXIDINE GLUCONATE 4 % EX LIQD: 60.0000 mL | Freq: Once | CUTANEOUS | Status: DC

## 2019-02-18 MED ORDER — METHOCARBAMOL 1000 MG/10ML IJ SOLN
500.0000 mg | Freq: Four times a day (QID) | INTRAVENOUS | Status: DC | PRN
  Filled 2019-02-18: qty 5

## 2019-02-18 MED ORDER — POTASSIUM CHLORIDE CRYS ER 10 MEQ PO TBCR
10.0000 meq | EXTENDED_RELEASE_TABLET | Freq: Every evening | ORAL | Status: DC
  Administered 2019-02-18 – 2019-02-19 (×2): 10 meq via ORAL
  Filled 2019-02-18 (×2): qty 1

## 2019-02-18 SURGICAL SUPPLY — 22 items
ADH SKN CLS APL DERMABOND .7 (GAUZE/BANDAGES/DRESSINGS) ×1
CEMENT KYPHON CX01A KIT/MIXER (Cement) ×3 IMPLANT
COVER WAND RF STERILE (DRAPES) ×3 IMPLANT
DERMABOND ADVANCED (GAUZE/BANDAGES/DRESSINGS) ×2
DERMABOND ADVANCED .7 DNX12 (GAUZE/BANDAGES/DRESSINGS) ×1 IMPLANT
DEVICE BIOPSY BONE KYPHX (INSTRUMENTS) ×1 IMPLANT
DRAPE C-ARM XRAY 36X54 (DRAPES) ×3 IMPLANT
DURAPREP 26ML APPLICATOR (WOUND CARE) ×3 IMPLANT
FEE RENTAL RFA GENERATOR (MISCELLANEOUS) IMPLANT
GLOVE SURG SYN 9.0  PF PI (GLOVE) ×2
GLOVE SURG SYN 9.0 PF PI (GLOVE) ×1 IMPLANT
GOWN SRG 2XL LVL 4 RGLN SLV (GOWNS) ×1 IMPLANT
GOWN STRL NON-REIN 2XL LVL4 (GOWNS) ×3
GOWN STRL REUS W/ TWL LRG LVL3 (GOWN DISPOSABLE) ×1 IMPLANT
GOWN STRL REUS W/TWL LRG LVL3 (GOWN DISPOSABLE) ×3
PACK KYPHOPLASTY (MISCELLANEOUS) ×3 IMPLANT
RENTAL RFA  GENERATOR (MISCELLANEOUS)
RENTAL RFA GENERATOR (MISCELLANEOUS) IMPLANT
STRAP SAFETY 5IN WIDE (MISCELLANEOUS) ×3 IMPLANT
TRAY KYPHOPAK 15/2 EXPRESS (KITS) ×2 IMPLANT
TRAY KYPHOPAK 15/3 EXPRESS 1ST (MISCELLANEOUS) ×1 IMPLANT
TRAY KYPHOPAK 20/3 EXPRESS 1ST (MISCELLANEOUS) ×1 IMPLANT

## 2019-02-18 NOTE — H&P (Signed)
Reviewed paper H+P, will be scanned into chart. No changes noted.  

## 2019-02-18 NOTE — Anesthesia Preprocedure Evaluation (Addendum)
Anesthesia Evaluation  Patient identified by MRN, date of birth, ID band Patient awake    Reviewed: Allergy & Precautions, NPO status , Patient's Chart, lab work & pertinent test results  History of Anesthesia Complications Negative for: history of anesthetic complications  Airway Mallampati: III  TM Distance: >3 FB Neck ROM: Full    Dental  (+) Edentulous Upper, Edentulous Lower   Pulmonary neg sleep apnea, COPD,  oxygen dependent, former smoker,    breath sounds clear to auscultation- rhonchi (-) wheezing      Cardiovascular hypertension, Pt. on medications +CHF  (-) CAD, (-) Past MI, (-) Cardiac Stents and (-) CABG  Rhythm:Regular Rate:Normal - Systolic murmurs and - Diastolic murmurs Echo 6/62/94: Left Ventricle: The left ventricle has normal systolic function, with an  ejection fraction of 55-60%. The cavity size was normal. There is no  increase in left ventricular wall thickness. Left ventricular diastolic  parameters were normal.   Neuro/Psych neg Seizures negative neurological ROS  negative psych ROS   GI/Hepatic negative GI ROS, Neg liver ROS,   Endo/Other  diabetes, Insulin Dependent  Renal/GU Renal InsufficiencyRenal disease     Musculoskeletal  (+) Arthritis ,   Abdominal (+) + obese,   Peds  Hematology  (+) anemia ,   Anesthesia Other Findings Past Medical History: 08/21/2018: Anemia of chronic kidney failure No date: Arthritis     Comment:  lower back No date: CHF (congestive heart failure) (HCC) No date: CKD (chronic kidney disease), stage III No date: COPD (chronic obstructive pulmonary disease) (HCC) No date: Diabetes mellitus without complication (HCC)     Comment:  type 2 No date: Dyspnea     Comment:  when active -wears oxygen 2L via [place] No date: History of blood transfusion No date: Hyperlipidemia No date: Hypertension No date: Requires continuous at home supplemental oxygen  Comment:  2L via [place]   Reproductive/Obstetrics                            Anesthesia Physical Anesthesia Plan  ASA: III  Anesthesia Plan: General   Post-op Pain Management:    Induction: Intravenous  PONV Risk Score and Plan: 1 and Propofol infusion  Airway Management Planned: Natural Airway  Additional Equipment:   Intra-op Plan:   Post-operative Plan:   Informed Consent: I have reviewed the patients History and Physical, chart, labs and discussed the procedure including the risks, benefits and alternatives for the proposed anesthesia with the patient or authorized representative who has indicated his/her understanding and acceptance.     Dental advisory given  Plan Discussed with: CRNA and Anesthesiologist  Anesthesia Plan Comments:         Anesthesia Quick Evaluation

## 2019-02-18 NOTE — Anesthesia Postprocedure Evaluation (Signed)
Anesthesia Post Note  Patient: Arthur [name]  Procedure(s) Performed: L2 KYPHOPLASTY (N/A )  Patient location during evaluation: PACU Anesthesia Type: General Level of consciousness: awake and alert Pain management: pain level controlled Vital Signs Assessment: post-procedure vital signs reviewed and stable Respiratory status: spontaneous breathing, nonlabored ventilation, respiratory function stable and patient connected to nasal cannula oxygen Cardiovascular status: blood pressure returned to baseline and stable Postop Assessment: no apparent nausea or vomiting Anesthetic complications: no     Last Vitals:  Vitals:   02/18/19 1551 02/18/19 1606  BP: (!) 128/46 (!) 119/48  Pulse: 74 73  Resp: 18 15  Temp: 36.4 C   SpO2: 100% 100%    Last Pain:  Vitals:   02/18/19 1606  TempSrc:   PainSc: 0-No pain                 Molli Barrows

## 2019-02-18 NOTE — Transfer of Care (Signed)
Immediate Anesthesia Transfer of Care Note  Patient: Arthur [name]  Procedure(s) Performed: L2 KYPHOPLASTY (N/A )  Patient Location: PACU  Anesthesia Type:MAC  Level of Consciousness: sedated  Airway & Oxygen Therapy: Patient Spontanous Breathing and Patient connected to face mask  Post-op Assessment: Report given to RN and Post -op Vital signs reviewed and stable  Post vital signs: Reviewed and stable  Last Vitals:  Vitals Value Taken Time  BP 128/46 02/18/19 1551  Temp 36.4 C 02/18/19 1551  Pulse 73 02/18/19 1605  Resp 0 02/18/19 1605  SpO2 100 % 02/18/19 1605  Vitals shown include unvalidated device data.  Last Pain:  Vitals:   02/18/19 1551  TempSrc:   PainSc: 0-No pain         Complications: No apparent anesthesia complications

## 2019-02-18 NOTE — Op Note (Signed)
02/18/2019  3:47 PM  PATIENT:  Arthur [name]  86 y.o. male  PRE-OPERATIVE DIAGNOSIS:  CLOSED WEDGE COMPRESSION FRACTURE OF L2  POST-OPERATIVE DIAGNOSIS:  CLOSED WEDGE COMPRESSION FRACTURE OF L2  PROCEDURE: L2 repeat kyphoplasty  SURGEON: Laurene Footman, MD  ASSISTANTS: None  ANESTHESIA:   local and MAC  EBL:  No intake/output data recorded.  BLOOD ADMINISTERED:none  DRAINS: none   LOCAL MEDICATIONS USED:  MARCAINE    and XYLOCAINE   SPECIMEN:  No Specimen  DISPOSITION OF SPECIMEN:  N/A  COUNTS:  YES  TOURNIQUET:  * No tourniquets in log *  IMPLANTS: Bone cement  DICTATION: .Dragon Dictation patient was brought to the operating room and after adequate sedation was obtained the patient was placed prone.  C arm was brought in in both AP and lateral projections and good visualization of L2 was obtained.  After appropriate patient identification and timeout procedures were completed 10 cc of 1% Xylocaine was infiltrated subcutaneously in the area of the prior incisions for L2.  After prepping and draping the back in the usual sterile fashion, repeat timeout procedure carried out.  Injection was made on the left side as that was the more involved side on MRI and could be better and was well visualized on the intraoperative images with fluoroscopy and extrapedicular approach made.  When the trocar was down to the level of the prior cement and inferior to it cement was mixed drilling was carried out in approximately 2-3/4 cc of cement was placed and some of this did go across the inferior aspect somewhat more posteriorly after the cemented set trochars were removed there appeared to be fill in the areas of edema on the prior MRI.  Dermabond was used to close the skin followed by Band-Aid.  Patient sent to recovery in stable condition  PLAN OF CARE: Admit for overnight observation  PATIENT DISPOSITION:  PACU - hemodynamically stable.

## 2019-02-18 NOTE — Progress Notes (Signed)
Helene Kelp, RN, states daughter [name] in lobby requesting to take pt's portable oxygen tank home. Helene Kelp took tank to daughter who understands to bring it back at discharge.

## 2019-02-18 NOTE — Anesthesia Procedure Notes (Signed)
Procedure Name: MAC Date/Time: 02/18/2019 3:15 PM Performed by: Allean Found, CRNA Pre-anesthesia Checklist: Patient identified, Emergency Drugs available, Suction available, Patient being monitored and Timeout performed Oxygen Delivery Method: Nasal cannula Induction Type: IV induction Placement Confirmation: positive ETCO2 Comments: Came with 2l/ minute [place] O2, later switched to facemask at  4l/min

## 2019-02-19 ENCOUNTER — Telehealth: Payer: Self-pay

## 2019-02-19 DIAGNOSIS — N184 Chronic kidney disease, stage 4 (severe): Secondary | ICD-10-CM | POA: Diagnosis not present

## 2019-02-19 DIAGNOSIS — I130 Hypertensive heart and chronic kidney disease with heart failure and stage 1 through stage 4 chronic kidney disease, or unspecified chronic kidney disease: Secondary | ICD-10-CM | POA: Diagnosis not present

## 2019-02-19 DIAGNOSIS — S32020A Wedge compression fracture of second lumbar vertebra, initial encounter for closed fracture: Secondary | ICD-10-CM | POA: Diagnosis not present

## 2019-02-19 DIAGNOSIS — I509 Heart failure, unspecified: Secondary | ICD-10-CM | POA: Diagnosis not present

## 2019-02-19 DIAGNOSIS — S32030A Wedge compression fracture of third lumbar vertebra, initial encounter for closed fracture: Secondary | ICD-10-CM | POA: Diagnosis not present

## 2019-02-19 DIAGNOSIS — E1122 Type 2 diabetes mellitus with diabetic chronic kidney disease: Secondary | ICD-10-CM | POA: Diagnosis not present

## 2019-02-19 DIAGNOSIS — Z20822 Contact with and (suspected) exposure to covid-19: Secondary | ICD-10-CM | POA: Diagnosis not present

## 2019-02-19 DIAGNOSIS — J449 Chronic obstructive pulmonary disease, unspecified: Secondary | ICD-10-CM | POA: Diagnosis not present

## 2019-02-19 LAB — GLUCOSE, CAPILLARY
Glucose-Capillary: 199 mg/dL — ABNORMAL HIGH (ref 70–99)
Glucose-Capillary: 218 mg/dL — ABNORMAL HIGH (ref 70–99)
Glucose-Capillary: 176 mg/dL — ABNORMAL HIGH (ref 70–99)
Glucose-Capillary: 142 mg/dL — ABNORMAL HIGH (ref 70–99)

## 2019-02-19 MED ORDER — HYDROCODONE-ACETAMINOPHEN 5-325 MG PO TABS: 1.0000 | ORAL_TABLET | Freq: Four times a day (QID) | ORAL | 0 refills | Status: DC | PRN

## 2019-02-19 MED ORDER — ALBUTEROL SULFATE HFA 108 (90 BASE) MCG/ACT IN AERS: 4.0000 | INHALATION_SPRAY | Freq: Two times a day (BID) | RESPIRATORY_TRACT | 0 refills | Status: DC

## 2019-02-19 NOTE — TOC Progression Note (Signed)
Transition of Care [facility]) - Progression Note    Patient Details  Name: Arthur [name] MRN: [id number] Date of Birth: 1933-03-29  Transition of Care [facility]) CM/SW Contact  Su Hilt, RN Phone Number: 02/19/2019, 2:20 PM  Clinical Narrative:   Magda Paganini with [facility] called and said they will only accept the patient if he has no cancer appointments while there and if his nebulizer will be changed to hand held inhalers, I notified the physician         Expected Discharge Plan and Services                                                 Social Determinants of Health (SDOH) Interventions    Readmission Risk Interventions Readmission Risk Prevention Plan 11/05/2018 10/29/2018 08/09/2018  Transportation Screening Complete Complete Complete  PCP or Specialist Appt within 5-7 Days - - -  PCP or Specialist Appt within 3-5 Days - - -  Home Care Screening - - -  Medication Review (RN CM) - - -  [place] or [place] for [place] - - -  Medication Review Press photographer) Complete Complete Complete  PCP or Specialist appointment within 3-5 days of discharge (No Data) - Complete  HRI or Home Care Consult - - Complete  SW Recovery Care/Counseling Consult - - Complete  Palliative Care Screening Not Applicable - Not Applicable  Skilled Nursing Facility Complete - Not Applicable  Some recent data might be hidden

## 2019-02-19 NOTE — Evaluation (Signed)
Occupational Therapy Evaluation Patient Details Name: Arthur [name] MRN: [id number] DOB: 1933-11-14 Today's Date: 02/19/2019    History of Present Illness Pt is an 86 y.o. male s/p L2 repeat kyphoplasty 2/16 secondary closed wedge fx of L2.  PMH includes anemia, CHF, CKD, COPD, DM, 2 L home O2, htn, L2 and L3 kyphoplasty, dyspnea when active.   Clinical Impression   Arthur [name] was seen for OT evaluation this date, POD#1 from from above proceedure. Prior to hospital admission, pt endorses he was required assistance for all ADL/IADL management and uses a RW to ambulate short household distances. Per chart, pt daughter provides near 24/7 assist, however, pt informs this Pryor Curia that his "wife" provides assistance for bathing, dressing, and IADL management in the home. Suspect pt to be unreliable historian as, per chart, pt is widowed. RN notified pt may be having change in cognitive status. Will continue to assess cognition as it pertains to functional independence, safety, and ADL management. Currently pt requires +2 min to CGA assist for functional transfers as well as moderate assistance for all LB ADL management. Pt educated in back precautions, self care skills, bed mobility and functional transfer training, AE/DME for bathing, dressing, and toileting needs, and home/routines modifications and falls prevention strategies to maximize safety and functional independence while minimizing falls risk and maintaining precautions. Pt return verbalizes understanding of back precautions, but would benefit from additional skilled OT services to support recall and carryover of education provided. Upon hospital DC recommend STR to maximize pt safety and functional independence.     Follow Up Recommendations  SNF    Equipment Recommendations  3 in 1 bedside commode    Recommendations for Other Services       Precautions / Restrictions Precautions Precautions: Fall;Back Precaution Comments: s/p L2  kyphoplasty Restrictions Weight Bearing Restrictions: No      Mobility Bed Mobility Overal bed mobility: Needs Assistance Bed Mobility: Rolling;Sidelying to Sit Rolling: Mod assist Sidelying to sit: Mod assist;+2 for physical assistance       General bed mobility comments: Deferred. Pt up in recliner at start/end of session.  Transfers Overall transfer level: Needs assistance Equipment used: Rolling walker (2 wheeled) Transfers: Sit to/from Stand Sit to Stand: Min guard;Min assist;+2 physical assistance;From elevated surface         General transfer comment: CGA to min assist x2 to stand from mildly elevated bed up to RW and then assist to control descent sitting down in recliner; vc's for technique    Balance Overall balance assessment: Needs assistance Sitting-balance support: No upper extremity supported;Feet supported Sitting balance-Leahy Scale: Good Sitting balance - Comments: steady sitting reaching within BOS   Standing balance support: Single extremity supported Standing balance-Leahy Scale: Poor Standing balance comment: pt requiring at least single UE support on walker for static standing balance                           ADL either performed or assessed with clinical judgement   ADL Overall ADL's : Needs assistance/impaired                                       General ADL Comments: Pt functionally limited by low-back pain s/p kyphoplasty, as well as decreased activity tolerance and generalized weakness. Requires +2 min assist for functional mobility/transfers. Moderate assist for LB ADL management in seated position  with maxcueing for safety and adherence to back precautions.     Vision Baseline Vision/History: Wears glasses Wears Glasses: Reading only Patient Visual Report: No change from baseline       Perception     Praxis      Pertinent Vitals/Pain Pain Assessment: Faces Faces Pain Scale: No hurt Pain Location: No  hurt while at rest. Pt endorses LBP and soreness with mobility. Pain Descriptors / Indicators: Sore Pain Intervention(s): Limited activity within patient's tolerance;Repositioned;Monitored during session     Hand Dominance Right   Extremity/Trunk Assessment Upper Extremity Assessment Upper Extremity Assessment: Generalized weakness   Lower Extremity Assessment Lower Extremity Assessment: Generalized weakness   Cervical / Trunk Assessment Cervical / Trunk Assessment: (forward head/shoulders)   Communication Communication Communication: No difficulties   Cognition Arousal/Alertness: Awake/alert Behavior During Therapy: WFL for tasks assessed/performed Overall Cognitive Status: Impaired/Different from baseline Area of Impairment: Memory                     Memory: Decreased recall of precautions;Decreased short-term memory         General Comments: Pt appears A&O t/o session however he provides elaborate information on ways his wife supports his ADL/IADL management. Per chart, pt is widowed and has children who assist him in the home. RN notified pt may be experiencing change in cognitive status.   General Comments  Pt spO2 monitored t/o session. Remains WFLs with pt on 2L [place].    Exercises Other Exercises Other Exercises: Pt educated in falls prevention strategies, safe use of AE for LB ADL management, and back precuations this date. Pt would benefit from review/opporutnity to trial AE for LB ADL management.   Shoulder Instructions      Home Living Family/patient expects to be discharged to:: Private residence Living Arrangements: Children Available Help at Discharge: Family;Available PRN/intermittently;Available 24 hours/day Type of Home: House Home Access: Stairs to enter [employer] of Steps: 6 (6 inch steps) Entrance Stairs-Rails: Right;Left;Can reach both Home Layout: One level     Bathroom Shower/Tub: Teacher, early years/pre:  Standard Bathroom Accessibility: Yes   Home Equipment: Clinical cytogeneticist - 2 wheels;Cane - single point   Additional Comments: Pt reports to this Pryor Curia that his wife assists him with all ADL/IADL management at home, however per chart pt is widowed and has help from his duaghter at home.      Prior Functioning/Environment Level of Independence: Needs assistance  Gait / Transfers Assistance Needed: Pt ambulates with RW in home.  Pt reports no recent falls.     Comments: Per chart, Limited household ambulation with RW; Ind with most ADLs, daughter provides daily check in and assist with groceries, laundry, errands.  Daughter, [name], states that he's fallen twice in the house in the last 6 months.        OT Problem List: Decreased strength;Decreased coordination;Pain;Decreased activity tolerance;Decreased safety awareness;Decreased knowledge of use of DME or AE;Impaired balance (sitting and/or standing);Decreased cognition;Decreased knowledge of precautions      OT Treatment/Interventions: Self-care/ADL training;Therapeutic exercise;Therapeutic activities;DME and/or AE instruction;Patient/family education;Balance training    OT Goals(Current goals can be found in the care plan section) Acute Rehab OT Goals Patient Stated Goal: to improve pain and mobility OT Goal Formulation: With patient Time For Goal Achievement: 03/05/19 Potential to Achieve Goals: Good ADL Goals Pt Will Perform Lower Body Dressing: with adaptive equipment;with supervision;with set-up(With LRAD PRN for improved safety and functional independence.) Pt Will Transfer to Toilet: bedside commode;ambulating;with supervision;with  set-up(With LRAD PRN for improved safety and functional independence.) Pt Will Perform Toileting - Clothing Manipulation and hygiene: with supervision;with set-up;with adaptive equipment;sit to/from stand(With LRAD PRN for improved safety and functional independence.)  OT Frequency: Min 1X/week    Barriers to D/C: Inaccessible home environment  Pt with 6 STE the home.       Co-evaluation              AM-PAC OT "6 Clicks" Daily Activity     Outcome Measure Help from another person eating meals?: None Help from another person taking care of personal grooming?: A Little Help from another person toileting, which includes using toliet, bedpan, or urinal?: A Lot Help from another person bathing (including washing, rinsing, drying)?: A Lot Help from another person to put on and taking off regular upper body clothing?: A Little Help from another person to put on and taking off regular lower body clothing?: A Lot 6 Click Score: 16   End of Session Nurse Communication: Mobility status  Activity Tolerance: Patient limited by fatigue;Patient limited by pain Patient left: in chair;with call bell/phone within reach;with chair alarm set;with SCD's reapplied  OT Visit Diagnosis: Other abnormalities of gait and mobility (R26.89);Muscle weakness (generalized) (M62.81);Pain Pain - Right/Left: (Both) Pain - part of body: (back)                Time: 7510-2585 OT Time Calculation (min): 18 min Charges:  OT General Charges $OT Visit: 1 Visit OT Evaluation $OT Eval Moderate Complexity: 1 Mod OT Treatments $Self Care/Home Management : 8-22 mins  Shara Blazing, M.S., OTR/L Ascom: [phone] 02/19/19, 4:08 PM

## 2019-02-19 NOTE — NC FL2 (Signed)
[place] LEVEL OF CARE SCREENING TOOL     IDENTIFICATION  Patient Name: Arthur [name] Birthdate: [birth year] Sex: male Admission Date (Current Location): 02/18/2019  [place] and [place] Number:  Engineering geologist and Address:  [facility], [address], [place], [place] [address]      Provider Number: [id number]  Attending Physician Name and Address:  [name], MD  Relative Name and Phone Number:  Terald Sleeper [phone]    Current Level of Care: Hospital Recommended Level of Care: [place] Prior Approval Number:    Date Approved/Denied:   PASRR Number: [id number]  Discharge Plan: SNF    Current Diagnoses: Patient Active Problem List   Diagnosis Date Noted  . S/P kyphoplasty 02/18/2019  . Goals of care, counseling/discussion 01/16/2019  . CKD (chronic kidney disease) stage 4, GFR 15-29 ml/min (HCC) 01/16/2019  . CKD stage 4 due to type 2 diabetes mellitus ([place])   . Closed compression fracture of second lumbar vertebra ([place])   . CKD stage 3 due to type 2 diabetes mellitus ([place])   . Chronic respiratory failure with hypoxia ([place])   . Benign prostatic hyperplasia without lower urinary tract symptoms   . History of anemia due to chronic kidney disease   . Anemia of chronic kidney failure 08/21/2018  . UTI (urinary tract infection) 08/07/2018  . Acute on chronic renal failure ([place]) 08/07/2018  . HLD (hyperlipidemia) 07/24/2018  . Acute on chronic respiratory failure with hypoxia ([place]) 07/06/2018  . Symptomatic anemia 06/27/2018  . GI bleeding 06/09/2018  . Sepsis secondary to UTI ([place]) 05/23/2018  . Cellulitis of right lower extremity 05/22/2018  . Diabetes ([place]) 10/24/2006  . OBESITY 10/24/2006  . Essential hypertension 10/24/2006  . ALLERGIC RHINITIS 10/24/2006  . COPD (chronic obstructive pulmonary disease) ([place]) 10/24/2006  . TOBACCO ABUSE, HX OF 10/24/2006    Orientation RESPIRATION BLADDER  Height & Weight     Self, Time, Situation, Place  Normal Continent Weight:   Height:     BEHAVIORAL SYMPTOMS/MOOD NEUROLOGICAL BOWEL NUTRITION STATUS      Continent    AMBULATORY STATUS COMMUNICATION OF NEEDS Skin   Extensive Assist Verbally Surgical wounds                       Personal Care Assistance Level of Assistance  Dressing     Dressing Assistance: Limited assistance     Functional Limitations Info             SPECIAL CARE FACTORS FREQUENCY  PT (By licensed PT), OT (By licensed OT)     PT Frequency: daily OT Frequency: daily            Contractures Contractures Info: Not present    Additional Factors Info  Code Status, Allergies Code Status Info: DNR Allergies Info: Codeine           Current Medications (02/19/2019):  This is the current hospital active medication list Current Facility-Administered Medications  Medication Dose Route Frequency Provider Last Rate Last Admin  . 0.9 %  sodium chloride infusion   Intravenous Continuous [name], MD 75 mL/hr at 02/19/19 0417 Rate Verify at 02/19/19 0417  . acetaminophen (TYLENOL) tablet 325-650 mg  325-650 mg Oral Q6H PRN [name], MD      . acetaminophen (TYLENOL) tablet 500 mg  500 mg Oral Q6H [name], MD   500 mg at 02/19/19 0558  . albuterol (PROVENTIL) (2.5 MG/3ML) 0.083%  nebulizer solution 2.5 mg  2.5 mg Inhalation Q6H PRN [name], MD      . amLODipine (NORVASC) tablet 2.5 mg  2.5 mg Oral Daily [name], MD   2.5 mg at 02/19/19 0839  . ascorbic acid (VITAMIN C) tablet 500 mg  500 mg Oral QHS [name], MD   500 mg at 02/18/19 2120  . bisacodyl (DULCOLAX) suppository 10 mg  10 mg Rectal Daily PRN [name], MD      . docusate sodium (COLACE) capsule 100 mg  100 mg Oral Ceasar Lund, MD   100 mg at 02/18/19 1859  . docusate sodium (COLACE) capsule 100 mg  100 mg Oral BID [name], MD   100 mg at 02/19/19 0840  . HYDROcodone-acetaminophen (NORCO)  7.5-325 MG per tablet 1-2 tablet  1-2 tablet Oral Q4H PRN [name], MD      . HYDROcodone-acetaminophen (NORCO/VICODIN) 5-325 MG per tablet 1-2 tablet  1-2 tablet Oral Q4H PRN [name], MD      . insulin aspart (novoLOG) injection 0-15 Units  0-15 Units Subcutaneous TID WC [name], MD   3 Units at 02/19/19 0840  . insulin detemir (LEVEMIR) injection 25 Units  25 Units Subcutaneous QHS [name], MD   25 Units at 02/18/19 2124  . ipratropium-albuterol (DUONEB) 0.5-2.5 (3) MG/3ML nebulizer solution 3 mL  3 mL Nebulization BID [name], MD   3 mL at 02/19/19 0747  . iron polysaccharides (NIFEREX) capsule 150 mg  150 mg Oral QPM [name], MD   150 mg at 02/18/19 2120  . magnesium citrate solution 1 Bottle  1 Bottle Oral Once PRN [name], MD      . magnesium hydroxide (MILK OF MAGNESIA) suspension 30 mL  30 mL Oral Daily PRN [name], MD      . methocarbamol (ROBAXIN) tablet 500 mg  500 mg Oral Q6H PRN [name], MD       Or  . methocarbamol (ROBAXIN) 500 mg in dextrose 5 % 50 mL IVPB  500 mg Intravenous Q6H PRN [name], MD      . metoCLOPramide (REGLAN) tablet 5-10 mg  5-10 mg Oral Q8H PRN [name], MD       Or  . metoCLOPramide (REGLAN) injection 5-10 mg  5-10 mg Intravenous Q8H PRN [name], MD      . morphine 2 MG/ML injection 0.5-1 mg  0.5-1 mg Intravenous Q2H PRN [name], MD      . ondansetron [facility]) tablet 4 mg  4 mg Oral Q6H PRN [name], MD       Or  . ondansetron [facility]) injection 4 mg  4 mg Intravenous Q6H PRN [name], MD      . pantoprazole (PROTONIX) EC tablet 40 mg  40 mg Oral BID [name], MD   40 mg at 02/19/19 0839  . polyethylene glycol (MIRALAX / GLYCOLAX) packet 17 g  17 g Oral Ceasar Lund, MD   17 g at 02/18/19 1859  . potassium chloride (KLOR-CON) CR tablet 10 mEq  10 mEq Oral QPM [name], MD   10 mEq at 02/18/19 1859  . pravastatin (PRAVACHOL) tablet 10 mg  10 mg Oral QHS [name], MD   10 mg at 02/18/19 2121  . tamsulosin (FLOMAX) capsule 0.4 mg  0.4 mg Oral Daily [name], MD   0.4 mg at 02/19/19 0839  . torsemide (DEMADEX) tablet 20 mg  20 mg Oral Q  M,W,F [name], MD   20 mg at 02/19/19 1165     Discharge Medications: Please see discharge summary for a list of discharge medications.  Relevant Imaging Results:  Relevant Lab Results:   Additional Information SSN: [id number]  Su Hilt, RN

## 2019-02-19 NOTE — Evaluation (Signed)
Physical Therapy Evaluation Patient Details Name: Arthur [name] MRN: [id number] DOB: 1933-09-21 Today's Date: 02/19/2019   History of Present Illness  Pt is an 86 y.o. male s/p L2 repeat kyphoplasty 2/16 secondary closed wedge fx of L2.  PMH includes anemia, CHF, CKD, COPD, DM, 2 L home O2, htn, L2 and L3 kyphoplasty, dyspnea when active.  Clinical Impression  Prior to hospital admission, pt was ambulatory; used 2 L home O2.  Currently pt is 2 assist with bed mobility via logrolling (limited d/t increased back pain with activity but minimal pain at rest); CGA to min assist x2 with transfers; and CGA x2 to walk a few feet with RW bed to recliner.  SOB noted with activity (O2 sats 95% or greater on 2 L O2 via nasal cannula during sessions activities).  Pt would benefit from skilled PT to address noted impairments and functional limitations (see below for any additional details).  Upon hospital discharge, pt would benefit from STR.    Follow Up Recommendations SNF    Equipment Recommendations  Rolling walker with 5" wheels;3in1 (PT)    Recommendations for Other Services OT consult     Precautions / Restrictions Precautions Precautions: Fall;Back Precaution Comments: s/p L2 kyphoplasty Restrictions Weight Bearing Restrictions: No      Mobility  Bed Mobility Overal bed mobility: Needs Assistance Bed Mobility: Rolling;Sidelying to Sit Rolling: Mod assist Sidelying to sit: Mod assist;+2 for physical assistance       General bed mobility comments: mod assist to logroll to L side with vc's for technique; mod assist x2 to perform L sidelying to sitting (assist for trunk and B LE's)  Transfers Overall transfer level: Needs assistance Equipment used: Rolling walker (2 wheeled) Transfers: Sit to/from Stand Sit to Stand: Min guard;Min assist;+2 physical assistance;From elevated surface         General transfer comment: CGA to min assist x2 to stand from mildly elevated bed up to RW  and then assist to control descent sitting down in recliner; vc's for technique  Ambulation/Gait Ambulation/Gait assistance: Min guard;+2 safety/equipment Gait Distance (Feet): 3 Feet(bed to recliner) Assistive device: Rolling walker (2 wheeled)   Gait velocity: decreased   General Gait Details: decreased B LE step length/foot clearance/heelstrike; vc's to stay closer to walker  Stairs            Wheelchair Mobility    Modified Rankin (Stroke Patients Only)       Balance Overall balance assessment: Needs assistance Sitting-balance support: No upper extremity supported;Feet supported Sitting balance-Leahy Scale: Good Sitting balance - Comments: steady sitting reaching within BOS   Standing balance support: Single extremity supported Standing balance-Leahy Scale: Poor Standing balance comment: pt requiring at least single UE support on walker for static standing balance                             Pertinent Vitals/Pain Pain Assessment: Faces Pain Location: low back Pain Descriptors / Indicators: Sore Pain Intervention(s): Limited activity within patient's tolerance;Monitored during session;Repositioned  HR WFL during sessions activities.    Home Living Family/patient expects to be discharged to:: Private residence Living Arrangements: Children Available Help at Discharge: Family;Available PRN/intermittently Type of Home: House Home Access: Stairs to enter Entrance Stairs-Rails: Right;Left;Can reach both Entrance Stairs-Number of Steps: 6 (6 inch steps) Home Layout: One level Home Equipment: Clinical cytogeneticist - 2 wheels;Cane - single point Additional Comments: Pt reports daughter/family stay with him at night and has  almost 24/7 assist    Prior Function Level of Independence: Needs assistance   Gait / Transfers Assistance Needed: Pt ambulates with RW in home.  Pt reports no recent falls.           Hand Dominance        Extremity/Trunk  Assessment   Upper Extremity Assessment Upper Extremity Assessment: Generalized weakness    Lower Extremity Assessment Lower Extremity Assessment: Generalized weakness    Cervical / Trunk Assessment Cervical / Trunk Assessment: (forward head/shoulders)  Communication   Communication: No difficulties  Cognition Arousal/Alertness: Awake/alert Behavior During Therapy: WFL for tasks assessed/performed Overall Cognitive Status: Within Functional Limits for tasks assessed                                        General Comments   Nursing cleared pt for participation in physical therapy.  Pt agreeable to PT session.    Exercises     Assessment/Plan    PT Assessment Patient needs continued PT services  PT Problem List Decreased strength;Decreased activity tolerance;Decreased balance;Decreased mobility;Decreased knowledge of use of DME;Decreased knowledge of precautions;Pain       PT Treatment Interventions DME instruction;Gait training;Stair training;Functional mobility training;Therapeutic activities;Therapeutic exercise;Balance training;Patient/family education    PT Goals (Current goals can be found in the Care Plan section)  Acute Rehab PT Goals Patient Stated Goal: to improve pain and mobility PT Goal Formulation: With patient Time For Goal Achievement: 03/12/19 Potential to Achieve Goals: Fair    Frequency 7X/week   Barriers to discharge Decreased caregiver support      Co-evaluation               AM-PAC PT "6 Clicks" Mobility  Outcome Measure Help needed turning from your back to your side while in a flat bed without using bedrails?: A Lot Help needed moving from lying on your back to sitting on the side of a flat bed without using bedrails?: Total Help needed moving to and from a bed to a chair (including a wheelchair)?: A Lot Help needed standing up from a chair using your arms (e.g., wheelchair or bedside chair)?: A Lot Help needed to walk  in hospital room?: A Lot Help needed climbing 3-5 steps with a railing? : A Lot 6 Click Score: 11    End of Session Equipment Utilized During Treatment: Gait belt Activity Tolerance: Patient tolerated treatment well Patient left: in chair;with call bell/phone within reach;with chair alarm set;with SCD's reapplied;Other (comment)(B heels floating via pillow) Nurse Communication: Mobility status;Precautions PT Visit Diagnosis: Other abnormalities of gait and mobility (R26.89);Muscle weakness (generalized) (M62.81);Difficulty in walking, not elsewhere classified (R26.2)    Time: 1020-1045 PT Time Calculation (min) (ACUTE ONLY): 25 min   Charges:   PT Evaluation $PT Eval Low Complexity: 1 Low PT Treatments $Therapeutic Activity: 8-22 mins        Leitha Bleak, PT 02/19/19, 1:06 PM

## 2019-02-19 NOTE — Telephone Encounter (Signed)
Patient currently admitted and will be discharged to SNF. Patient's appts for tomorrow (lab/MD/poss blood) will need to be cancelled / rescheduled due to hazardous weather. Spoke to patient's daughther [name] who voiced understanding and will call back to reschedule once she finds out what facility patient will be going to.

## 2019-02-19 NOTE — Progress Notes (Signed)
Nutrition Brief Note RD working remotely.  Patient identified on the Malnutrition Screening Tool (MST) Report  Wt Readings from Last 15 Encounters:  02/14/19 91.2 kg  02/06/19 92.7 kg  01/28/19 91.6 kg  01/15/19 94.3 kg  10/28/18 78.4 kg  09/24/18 101.5 kg  08/27/18 102.8 kg  08/13/18 103 kg  08/07/18 100.6 kg  07/25/18 114.4 kg  07/15/18 115.7 kg  07/10/18 120 kg  07/04/18 114.8 kg  06/19/18 119.1 kg  06/09/18 115.7 kg   Spoke with patient over the phone. He reports he has a good appetite and intake that is unchanged from baseline. He is eating 100% of his meals here. He denies any weight loss and reports he is weight-stable. Noted weights in chart are variable but this is likely due to changes in fluid status. Patient denies any education needs at this time.  There is no height or weight on file to calculate BMI. Patient meets criteria for obesity class I based on previously filed height of 170.2 cm and weight of 91.2 kg.  Current diet order is carbohydrate modified, patient is consuming approximately 100% of meals at this time. Labs and medications reviewed.   No nutrition interventions warranted at this time. If nutrition issues arise, please consult RD.   Jacklynn Barnacle, MS, RD, LDN Pager number available on Amion

## 2019-02-19 NOTE — Discharge Summary (Addendum)
Physician Discharge Summary  Patient ID: Arthur [name] MRN: [id number] DOB/AGE: June 24, 1933 86 y.o.  Admit date: 02/18/2019 Discharge date: 02/20/2019 Admission Diagnoses:  S/P kyphoplasty [C78.938]   Discharge Diagnoses: Patient Active Problem List   Diagnosis Date Noted  . S/P kyphoplasty 02/18/2019  . Goals of care, counseling/discussion 01/16/2019  . CKD (chronic kidney disease) stage 4, GFR 15-29 ml/min (HCC) 01/16/2019  . CKD stage 4 due to type 2 diabetes mellitus ([place])   . Closed compression fracture of second lumbar vertebra ([place])   . CKD stage 3 due to type 2 diabetes mellitus ([place])   . Chronic respiratory failure with hypoxia ([place])   . Benign prostatic hyperplasia without lower urinary tract symptoms   . History of anemia due to chronic kidney disease   . Anemia of chronic kidney failure 08/21/2018  . UTI (urinary tract infection) 08/07/2018  . Acute on chronic renal failure ([place]) 08/07/2018  . HLD (hyperlipidemia) 07/24/2018  . Acute on chronic respiratory failure with hypoxia ([place]) 07/06/2018  . Symptomatic anemia 06/27/2018  . GI bleeding 06/09/2018  . Sepsis secondary to UTI ([place]) 05/23/2018  . Cellulitis of right lower extremity 05/22/2018  . Diabetes ([place]) 10/24/2006  . OBESITY 10/24/2006  . Essential hypertension 10/24/2006  . ALLERGIC RHINITIS 10/24/2006  . COPD (chronic obstructive pulmonary disease) ([place]) 10/24/2006  . TOBACCO ABUSE, HX OF 10/24/2006    Past Medical History:  Diagnosis Date  . Anemia of chronic kidney failure 08/21/2018  . Arthritis    lower back  . CHF (congestive heart failure) ([place])   . CKD (chronic kidney disease), stage III   . COPD (chronic obstructive pulmonary disease) ([place])   . Diabetes mellitus without complication ([place])    type 2  . Dyspnea    when active -wears oxygen 2L via [place]  . History of blood transfusion   . Hyperlipidemia   . Hypertension   . Requires continuous at home supplemental oxygen    2L via [place]      Transfusion: None   Consultants (if any):   Discharged Condition: Improved  Hospital Course: Arthur [name] is an 86 y.o. male who was admitted 02/18/2019 with a diagnosis of L2 kyphoplasty and went to the operating room on 02/18/2019 and underwent the above named procedures.    Surgeries: Procedure(s): L2 KYPHOPLASTY on 02/18/2019 Patient tolerated the surgery well. Taken to PACU where she was stabilized and then transferred to the orthopedic floor.  On postop day 1, patient noted significant improvement in back pain.  Still has some pain with rolling over in bed but less pain than what he was having prior to surgery.  Patient's vital signs are stable.  Pain well controlled.  On postop day 1, patient stable and ready for discharge to skilled nursing facility.  Patient unable to get authorization from facility on postop day 1.  On postop day 2, patient's pain improving.  Vital signs stable.  Patient stable and ready for discharge to skilled nursing facility   Patient typically receives Duoneb BID but due to SNF restrictions albuterol inhaler 4 puffs BID is ordered.   He was given perioperative antibiotics:  Anti-infectives (From admission, onward)   Start     Dose/Rate Route Frequency Ordered Stop   02/19/19 0600  ceFAZolin (ANCEF) IVPB 2g/100 mL premix     2 g 200 mL/hr over 30 Minutes Intravenous On call to O.R. 02/18/19 1256 02/18/19 1525   02/18/19 1313  ceFAZolin (ANCEF) 2-4 GM/100ML-% IVPB  Note to Pharmacy: [place], [place]   : cabinet override      02/18/19 1313 02/18/19 1553    .   He benefited maximally from the hospital stay and there were no complications.    Recent vital signs:  Vitals:   02/20/19 0037 02/20/19 0821  BP: (!) 119/52 (!) 155/52  Pulse: 78 65  Resp: 18 18  Temp: 98 F (36.7 C) 98.6 F (37 C)  SpO2: 99% 97%    Recent laboratory studies:  Lab Results  Component Value Date   HGB 7.7 (L) 02/06/2019   HGB 7.8 (L) 01/28/2019   HGB 8.0 (L)  01/23/2019   Lab Results  Component Value Date   WBC 5.5 02/06/2019   PLT 376 02/06/2019   Lab Results  Component Value Date   INR 1.1 08/07/2018   Lab Results  Component Value Date   NA 136 01/28/2019   K 4.2 01/28/2019   CL 100 01/28/2019   CO2 25 01/28/2019   BUN 35 (H) 01/28/2019   CREATININE 2.20 (H) 01/28/2019   GLUCOSE 165 (H) 01/28/2019    Discharge Medications:   Allergies as of 02/20/2019      Reactions   Codeine Other (See Comments)   Constipation      Medication List    STOP taking these medications   ipratropium-albuterol 0.5-2.5 (3) MG/3ML Soln Commonly known as: DUONEB     TAKE these medications   albuterol 108 (90 Base) MCG/ACT inhaler Commonly known as: VENTOLIN HFA Inhale 2 puffs into the lungs every 6 (six) hours as needed for wheezing or shortness of breath. What changed: Another medication with the same name was added. Make sure you understand how and when to take each.   albuterol 108 (90 Base) MCG/ACT inhaler Commonly known as: VENTOLIN HFA Inhale 4 puffs into the lungs every 12 (twelve) hours. What changed: You were already taking a medication with the same name, and this prescription was added. Make sure you understand how and when to take each.   amLODipine 2.5 MG tablet Commonly known as: NORVASC Take 2.5 mg by mouth daily.   ascorbic acid 500 MG tablet Commonly known as: VITAMIN C Take 500 mg by mouth at bedtime.   docusate sodium 100 MG capsule Commonly known as: COLACE Take 1 capsule (100 mg total) by mouth 2 (two) times daily. What changed: when to take this   HYDROcodone-acetaminophen 5-325 MG tablet Commonly known as: Norco Take 1 tablet by mouth every 6 (six) hours as needed.   insulin aspart 100 UNIT/ML injection Commonly known as: novoLOG Inject 5 Units into the skin 3 (three) times daily with meals. What changed:   how much to take  when to take this  additional instructions   iron polysaccharides 150 MG  capsule Commonly known as: NIFEREX Take 1 capsule (150 mg total) by mouth daily. What changed: when to take this   Levemir 100 UNIT/ML injection Generic drug: insulin detemir Inject 0.12 mLs (12 Units total) into the skin daily. What changed:   how much to take  when to take this   pantoprazole 40 MG tablet Commonly known as: Protonix Take 1 tablet (40 mg total) by mouth 2 (two) times daily.   polyethylene glycol powder 17 GM/SCOOP powder Commonly known as: GLYCOLAX/MIRALAX Take 17 g by mouth every other day.   potassium chloride 10 MEQ tablet Commonly known as: KLOR-CON Take 10 mEq by mouth every evening.   pravastatin 10 MG tablet Commonly known as:  PRAVACHOL Take 10 mg by mouth at bedtime.   tamsulosin 0.4 MG Caps capsule Commonly known as: FLOMAX Take 0.4 mg by mouth daily.   torsemide 20 MG tablet Commonly known as: DEMADEX Take 20 mg by mouth every Monday, Wednesday, and Friday.       Diagnostic Studies: DG Lumbar Spine 2-3 Views  Result Date: 02/18/2019 CLINICAL DATA:  Kyphoplasty EXAM: LUMBAR SPINE - 2-3 VIEW; DG C-ARM 1-60 MIN COMPARISON:  12/24/2018, MRI 01/28/2019 FINDINGS: Two lateral and 2 AP spot fluoroscopic images of the lumbar spine demonstrate 2 level kyphoplasty changes. Total fluoroscopy time was 2 minutes. IMPRESSION: Intraoperative fluoroscopic images of the lumbar spine for kyphoplasty. Electronically Signed   By: Donavan Foil M.D.   On: 02/18/2019 16:20   MR LUMBAR SPINE WO CONTRAST  Result Date: 01/28/2019 CLINICAL DATA:  Continue severe low back pain. Vertebroplasty 12/24/2018 EXAM: MRI LUMBAR SPINE WITHOUT CONTRAST TECHNIQUE: Multiplanar, multisequence MR imaging of the lumbar spine was performed. No intravenous contrast was administered. COMPARISON:  Lumbar MRI 12/28/2018 Anesthesia assisted study.  A diagnostic quality study was obtained. FINDINGS: Segmentation:  Normal Alignment:  Mild retrolisthesis L3-4, unchanged. Vertebrae: Kyphoplasty  L2 for fracture. Fluid between the L2-3 disc and inferior endplate of L2. Cement shows adequate distribution. Mild bone marrow edema. No further fracture compared to the prior MRI Moderate compression fracture L3 with kyphoplasty. Mild bone marrow edema around the cement. Mild retropulsion of bone into the canal causing mild spinal stenosis. No fracture seen on the prior MRI. No other fractures in the lumbar spine. Negative for metastatic disease. Conus medullaris and cauda equina: Conus extends to the T12-L1 level. Conus and cauda equina appear normal. Paraspinal and other soft tissues: Cholelithiasis. No paraspinous mass or adenopathy. Disc levels: T12-L1: Negative L1-2: Diffuse disc bulging and facet degeneration causing mild spinal stenosis. No significant interval change. L2-3: Disc bulging and endplate spurring with moderate facet hypertrophy. Mild spinal stenosis, unchanged. L3-4: Diffuse disc bulging with endplate spurring. Bilateral facet hypertrophy. Moderate spinal stenosis unchanged. Moderate subarticular stenosis bilaterally. L3 fracture with retropulsion of bone into the canal causing mild spinal stenosis at the upper L3 level. L4-5: Disc degeneration and spondylosis. Diffuse endplate spurring and bilateral facet degeneration. Mild spinal stenosis. Mild subarticular stenosis bilaterally L5-S1: Negative IMPRESSION: Kyphoplasty for fracture at L2. No further fracture since the prior MRI Moderate compression fracture L3 with kyphoplasty. Fracture not present on the prior MRI. Retropulsion of bone into the canal with mild spinal stenosis Multilevel spinal stenosis in the lumbar spine due to degenerative changes as described above. Electronically Signed   By: Franchot Gallo M.D.   On: 01/28/2019 16:38   DG C-Arm 1-60 Min  Result Date: 02/18/2019 CLINICAL DATA:  Kyphoplasty EXAM: LUMBAR SPINE - 2-3 VIEW; DG C-ARM 1-60 MIN COMPARISON:  12/24/2018, MRI 01/28/2019 FINDINGS: Two lateral and 2 AP spot  fluoroscopic images of the lumbar spine demonstrate 2 level kyphoplasty changes. Total fluoroscopy time was 2 minutes. IMPRESSION: Intraoperative fluoroscopic images of the lumbar spine for kyphoplasty. Electronically Signed   By: Donavan Foil M.D.   On: 02/18/2019 16:20    Disposition: Discharge disposition: 03-Skilled [place] information for follow-up providers    [name], MD Follow up.   Specialty: Orthopedic Surgery Why: March 05, 2019 @ 3:15 PM  Contact information: [address] [facility] [place] [address] [phone]  Contact information for after-discharge care    [facility] .   Service: Skilled Nursing Contact information: [place] [phone]                   Signed: Feliberto Gottron 02/20/2019, 8:47 AM

## 2019-02-19 NOTE — TOC Progression Note (Signed)
Transition of Care [facility]) - Progression Note    Patient Details  Name: Arthur [name] MRN: [id number] Date of Birth: Oct 31, 1933  Transition of Care [facility]) CM/SW Contact  Su Hilt, RN Phone Number: 02/19/2019, 12:03 PM  Clinical Narrative:   Patient wants to go to [facility], I called Magda Paganini and she wanted to take a look and let me know, FL2, PASSR completed, information sent to [facility] thru the [employer]         Expected Discharge Plan and Services                                                 Social Determinants of Health (SDOH) Interventions    Readmission Risk Interventions Readmission Risk Prevention Plan 11/05/2018 10/29/2018 08/09/2018  Transportation Screening Complete Complete Complete  PCP or Specialist Appt within 5-7 Days - - -  PCP or Specialist Appt within 3-5 Days - - -  Home Care Screening - - -  Medication Review (RN CM) - - -  [place]RI or [place] Work Consult for [place] - - -  Medication Review Press photographer) Complete Complete Complete  PCP or Specialist appointment within 3-5 days of discharge (No Data) - Complete  HRI or Home Care Consult - - Complete  SW Recovery Care/Counseling Consult - - Complete  Palliative Care Screening Not Applicable - Not Applicable  Skilled Nursing Facility Complete - Not Applicable  Some recent data might be hidden

## 2019-02-19 NOTE — TOC Progression Note (Signed)
Transition of Care [facility]) - Progression Note    Patient Details  Name: Arthur [name] MRN: [id number] Date of Birth: 01-04-1933  Transition of Care [facility]) CM/SW Contact  Su Hilt, RN Phone Number: 02/19/2019, 12:12 PM  Clinical Narrative:   [place] to get authorzation, the patient is not [facility] and would need regular humana, facility to obtain auth         Expected Discharge Plan and Services                                                 Social Determinants of Health (SDOH) Interventions    Readmission Risk Interventions Readmission Risk Prevention Plan 11/05/2018 10/29/2018 08/09/2018  Transportation Screening Complete Complete Complete  PCP or Specialist Appt within 5-7 Days - - -  PCP or Specialist Appt within 3-5 Days - - -  Home Care Screening - - -  Medication Review (RN CM) - - -  [place] or [place] for [place] - - -  Medication Review Press photographer) Complete Complete Complete  PCP or Specialist appointment within 3-5 days of discharge (No Data) - Complete  HRI or Home Care Consult - - Complete  SW Recovery Care/Counseling Consult - - Complete  Palliative Care Screening Not Applicable - Not Applicable  Skilled Nursing Facility Complete - Not Applicable  Some recent data might be hidden

## 2019-02-19 NOTE — Progress Notes (Signed)
   Subjective: 1 Day Post-Op Procedure(s) (LRB): L2 KYPHOPLASTY (N/A) Patient reports pain as mild.  Overall back pain improved, still have some pain with trying to turn over.  Has not tried ambulating yet.  Prior to surgery pain was severe and debilitating and limited his mobility Patient is well, and has had no acute complaints or problems Denies any CP, SOB, ABD pain. We will start physical therapy today.  Objective: Vital signs in last 24 hours: Temp:  [97.6 F (36.4 C)-99 F (37.2 C)] 98.4 F (36.9 C) (02/17 0413) Pulse Rate:  [73-93] 73 (02/17 0413) Resp:  [15-20] 18 (02/17 0413) BP: (119-151)/(46-59) 128/56 (02/17 0413) SpO2:  [97 %-100 %] 100 % (02/17 0413) FiO2 (%):  [28 %] 28 % (02/16 1832)  Intake/Output from previous day: 02/16 0701 - 02/17 0700 In: 1354.9 [P.O.:340; I.V.:914.9; IV Piggyback:100] Out: 575 [Urine:575] Intake/Output this shift: No intake/output data recorded.  No results for input(s): HGB in the last 72 hours. No results for input(s): WBC, RBC, HCT, PLT in the last 72 hours. No results for input(s): NA, K, CL, CO2, BUN, CREATININE, GLUCOSE, CALCIUM in the last 72 hours. No results for input(s): LABPT, INR in the last 72 hours.  EXAM General - Patient is Alert, Appropriate and Oriented Extremity - Neurovascular intact Sensation intact distally Intact pulses distally Dressing - dressing C/D/I and no drainage Motor Function - intact, moving foot and toes well on exam.     Past Medical History:  Diagnosis Date  . Anemia of chronic kidney failure 08/21/2018  . Arthritis    lower back  . CHF (congestive heart failure) ([place])   . CKD (chronic kidney disease), stage III   . COPD (chronic obstructive pulmonary disease) ([place])   . Diabetes mellitus without complication ([place])    type 2  . Dyspnea    when active -wears oxygen 2L via [place]  . History of blood transfusion   . Hyperlipidemia   . Hypertension   . Requires continuous at home supplemental  oxygen    2L via [place]    Assessment/Plan:   1 Day Post-Op Procedure(s) (LRB): L2 KYPHOPLASTY (N/A) Active Problems:   S/P kyphoplasty  Estimated body mass index is 31.48 kg/m as calculated from the following:   Height as of 02/14/19: 5\' 7"  (1.702 m).   Weight as of 02/14/19: 91.2 kg. Advance diet Up with therapy Plan on discharge to [facility] pending authorization.   Ronney Asters, PA-C [place] 02/19/2019, 8:16 AM

## 2019-02-19 NOTE — TOC Progression Note (Signed)
Transition of Care [facility]) - Progression Note    Patient Details  Name: Arthur [name] MRN: [id number] Date of Birth: 03-17-1933  Transition of Care [facility]) CM/SW [place], RN Phone Number: 02/19/2019, 2:55 PM  Clinical Narrative:   Damaris Schooner with Magda Paganini at [facility], I sent DC summary and DC order to her thru the [facility], the patient does not have any cancer appointments pending and the Nebulizers were changed to hand held inhalers, I asked if she thought that she would get auth today, she stated that she is just now starting it, I requested her to start it at 1212 this afternoon.  And she agreed to do so.  I let her know at 1308  That the PT notes were in for Insurance purposes         Expected Discharge Plan and Services           Expected Discharge Date: 02/19/19                                     Social Determinants of Health (SDOH) Interventions    Readmission Risk Interventions Readmission Risk Prevention Plan 11/05/2018 10/29/2018 08/09/2018  Transportation Screening Complete Complete Complete  PCP or Specialist Appt within 5-7 Days - - -  PCP or Specialist Appt within 3-5 Days - - -  Home Care Screening - - -  Medication Review (RN CM) - - -  [place] or [place] Work Consult for [place] - - -  Medication Review Press photographer) Complete Complete Complete  PCP or Specialist appointment within 3-5 days of discharge (No Data) - Complete  HRI or Home Care Consult - - Complete  SW Recovery Care/Counseling Consult - - Complete  Palliative Care Screening Not Applicable - Not Applicable  Skilled Nursing Facility Complete - Not Applicable  Some recent data might be hidden

## 2019-02-20 ENCOUNTER — Inpatient Hospital Stay: Payer: Medicare HMO

## 2019-02-20 ENCOUNTER — Inpatient Hospital Stay: Payer: Medicare HMO | Admitting: Oncology

## 2019-02-20 DIAGNOSIS — W19XXXA Unspecified fall, initial encounter: Secondary | ICD-10-CM | POA: Diagnosis not present

## 2019-02-20 DIAGNOSIS — I959 Hypotension, unspecified: Secondary | ICD-10-CM | POA: Diagnosis not present

## 2019-02-20 DIAGNOSIS — Z9181 History of falling: Secondary | ICD-10-CM | POA: Diagnosis not present

## 2019-02-20 DIAGNOSIS — Z20822 Contact with and (suspected) exposure to covid-19: Secondary | ICD-10-CM | POA: Diagnosis not present

## 2019-02-20 DIAGNOSIS — S32020A Wedge compression fracture of second lumbar vertebra, initial encounter for closed fracture: Secondary | ICD-10-CM | POA: Diagnosis not present

## 2019-02-20 DIAGNOSIS — E876 Hypokalemia: Secondary | ICD-10-CM | POA: Diagnosis present

## 2019-02-20 DIAGNOSIS — J90 Pleural effusion, not elsewhere classified: Secondary | ICD-10-CM | POA: Diagnosis not present

## 2019-02-20 DIAGNOSIS — Z515 Encounter for palliative care: Secondary | ICD-10-CM | POA: Diagnosis not present

## 2019-02-20 DIAGNOSIS — R29898 Other symptoms and signs involving the musculoskeletal system: Secondary | ICD-10-CM | POA: Diagnosis not present

## 2019-02-20 DIAGNOSIS — D62 Acute posthemorrhagic anemia: Secondary | ICD-10-CM | POA: Diagnosis not present

## 2019-02-20 DIAGNOSIS — J948 Other specified pleural conditions: Secondary | ICD-10-CM | POA: Diagnosis not present

## 2019-02-20 DIAGNOSIS — R0602 Shortness of breath: Secondary | ICD-10-CM | POA: Diagnosis not present

## 2019-02-20 DIAGNOSIS — D649 Anemia, unspecified: Secondary | ICD-10-CM | POA: Diagnosis not present

## 2019-02-20 DIAGNOSIS — J9621 Acute and chronic respiratory failure with hypoxia: Secondary | ICD-10-CM | POA: Diagnosis not present

## 2019-02-20 DIAGNOSIS — J9611 Chronic respiratory failure with hypoxia: Secondary | ICD-10-CM | POA: Diagnosis not present

## 2019-02-20 DIAGNOSIS — D631 Anemia in chronic kidney disease: Secondary | ICD-10-CM | POA: Diagnosis not present

## 2019-02-20 DIAGNOSIS — R069 Unspecified abnormalities of breathing: Secondary | ICD-10-CM | POA: Diagnosis not present

## 2019-02-20 DIAGNOSIS — J189 Pneumonia, unspecified organism: Secondary | ICD-10-CM | POA: Diagnosis not present

## 2019-02-20 DIAGNOSIS — M4856XD Collapsed vertebra, not elsewhere classified, lumbar region, subsequent encounter for fracture with routine healing: Secondary | ICD-10-CM | POA: Diagnosis not present

## 2019-02-20 DIAGNOSIS — E119 Type 2 diabetes mellitus without complications: Secondary | ICD-10-CM | POA: Diagnosis not present

## 2019-02-20 DIAGNOSIS — R1084 Generalized abdominal pain: Secondary | ICD-10-CM | POA: Diagnosis not present

## 2019-02-20 DIAGNOSIS — K219 Gastro-esophageal reflux disease without esophagitis: Secondary | ICD-10-CM | POA: Diagnosis present

## 2019-02-20 DIAGNOSIS — E785 Hyperlipidemia, unspecified: Secondary | ICD-10-CM | POA: Diagnosis present

## 2019-02-20 DIAGNOSIS — M2550 Pain in unspecified joint: Secondary | ICD-10-CM | POA: Diagnosis not present

## 2019-02-20 DIAGNOSIS — N183 Chronic kidney disease, stage 3 unspecified: Secondary | ICD-10-CM | POA: Diagnosis not present

## 2019-02-20 DIAGNOSIS — M47819 Spondylosis without myelopathy or radiculopathy, site unspecified: Secondary | ICD-10-CM | POA: Diagnosis present

## 2019-02-20 DIAGNOSIS — R7309 Other abnormal glucose: Secondary | ICD-10-CM | POA: Diagnosis not present

## 2019-02-20 DIAGNOSIS — I5032 Chronic diastolic (congestive) heart failure: Secondary | ICD-10-CM | POA: Diagnosis not present

## 2019-02-20 DIAGNOSIS — N1831 Chronic kidney disease, stage 3a: Secondary | ICD-10-CM | POA: Diagnosis not present

## 2019-02-20 DIAGNOSIS — Z66 Do not resuscitate: Secondary | ICD-10-CM | POA: Diagnosis not present

## 2019-02-20 DIAGNOSIS — J449 Chronic obstructive pulmonary disease, unspecified: Secondary | ICD-10-CM | POA: Diagnosis not present

## 2019-02-20 DIAGNOSIS — N1832 Chronic kidney disease, stage 3b: Secondary | ICD-10-CM | POA: Diagnosis not present

## 2019-02-20 DIAGNOSIS — Z885 Allergy status to narcotic agent status: Secondary | ICD-10-CM | POA: Diagnosis not present

## 2019-02-20 DIAGNOSIS — Z720 Tobacco use: Secondary | ICD-10-CM | POA: Diagnosis not present

## 2019-02-20 DIAGNOSIS — I10 Essential (primary) hypertension: Secondary | ICD-10-CM | POA: Diagnosis not present

## 2019-02-20 DIAGNOSIS — J441 Chronic obstructive pulmonary disease with (acute) exacerbation: Secondary | ICD-10-CM | POA: Diagnosis not present

## 2019-02-20 DIAGNOSIS — R5381 Other malaise: Secondary | ICD-10-CM | POA: Diagnosis not present

## 2019-02-20 DIAGNOSIS — R110 Nausea: Secondary | ICD-10-CM | POA: Diagnosis not present

## 2019-02-20 DIAGNOSIS — E039 Hypothyroidism, unspecified: Secondary | ICD-10-CM | POA: Diagnosis not present

## 2019-02-20 DIAGNOSIS — R1111 Vomiting without nausea: Secondary | ICD-10-CM | POA: Diagnosis not present

## 2019-02-20 DIAGNOSIS — K5900 Constipation, unspecified: Secondary | ICD-10-CM | POA: Diagnosis present

## 2019-02-20 DIAGNOSIS — R58 Hemorrhage, not elsewhere classified: Secondary | ICD-10-CM | POA: Diagnosis not present

## 2019-02-20 DIAGNOSIS — Z87440 Personal history of urinary (tract) infections: Secondary | ICD-10-CM | POA: Diagnosis not present

## 2019-02-20 DIAGNOSIS — M47816 Spondylosis without myelopathy or radiculopathy, lumbar region: Secondary | ICD-10-CM | POA: Diagnosis not present

## 2019-02-20 DIAGNOSIS — R109 Unspecified abdominal pain: Secondary | ICD-10-CM | POA: Diagnosis not present

## 2019-02-20 DIAGNOSIS — R1110 Vomiting, unspecified: Secondary | ICD-10-CM | POA: Diagnosis not present

## 2019-02-20 DIAGNOSIS — J690 Pneumonitis due to inhalation of food and vomit: Secondary | ICD-10-CM | POA: Diagnosis not present

## 2019-02-20 DIAGNOSIS — N184 Chronic kidney disease, stage 4 (severe): Secondary | ICD-10-CM | POA: Diagnosis not present

## 2019-02-20 DIAGNOSIS — J431 Panlobular emphysema: Secondary | ICD-10-CM | POA: Diagnosis not present

## 2019-02-20 DIAGNOSIS — R531 Weakness: Secondary | ICD-10-CM | POA: Diagnosis not present

## 2019-02-20 DIAGNOSIS — R112 Nausea with vomiting, unspecified: Secondary | ICD-10-CM | POA: Diagnosis not present

## 2019-02-20 DIAGNOSIS — Z7189 Other specified counseling: Secondary | ICD-10-CM | POA: Diagnosis not present

## 2019-02-20 DIAGNOSIS — F17220 Nicotine dependence, chewing tobacco, uncomplicated: Secondary | ICD-10-CM | POA: Diagnosis not present

## 2019-02-20 DIAGNOSIS — I130 Hypertensive heart and chronic kidney disease with heart failure and stage 1 through stage 4 chronic kidney disease, or unspecified chronic kidney disease: Secondary | ICD-10-CM | POA: Diagnosis not present

## 2019-02-20 DIAGNOSIS — E1122 Type 2 diabetes mellitus with diabetic chronic kidney disease: Secondary | ICD-10-CM | POA: Diagnosis not present

## 2019-02-20 DIAGNOSIS — E1121 Type 2 diabetes mellitus with diabetic nephropathy: Secondary | ICD-10-CM | POA: Diagnosis not present

## 2019-02-20 DIAGNOSIS — Z79899 Other long term (current) drug therapy: Secondary | ICD-10-CM | POA: Diagnosis not present

## 2019-02-20 DIAGNOSIS — N400 Enlarged prostate without lower urinary tract symptoms: Secondary | ICD-10-CM | POA: Diagnosis not present

## 2019-02-20 DIAGNOSIS — S32020D Wedge compression fracture of second lumbar vertebra, subsequent encounter for fracture with routine healing: Secondary | ICD-10-CM | POA: Diagnosis not present

## 2019-02-20 DIAGNOSIS — Z9889 Other specified postprocedural states: Secondary | ICD-10-CM | POA: Diagnosis not present

## 2019-02-20 DIAGNOSIS — Z794 Long term (current) use of insulin: Secondary | ICD-10-CM | POA: Diagnosis not present

## 2019-02-20 DIAGNOSIS — I509 Heart failure, unspecified: Secondary | ICD-10-CM | POA: Diagnosis not present

## 2019-02-20 DIAGNOSIS — Z7401 Bed confinement status: Secondary | ICD-10-CM | POA: Diagnosis not present

## 2019-02-20 DIAGNOSIS — N189 Chronic kidney disease, unspecified: Secondary | ICD-10-CM | POA: Diagnosis not present

## 2019-02-20 DIAGNOSIS — S32030A Wedge compression fracture of third lumbar vertebra, initial encounter for closed fracture: Secondary | ICD-10-CM | POA: Diagnosis not present

## 2019-02-20 LAB — RESPIRATORY PANEL BY RT PCR (FLU A&B, COVID)
Influenza A by PCR: NEGATIVE
Influenza B by PCR: NEGATIVE
SARS Coronavirus 2 by RT PCR: NEGATIVE

## 2019-02-20 LAB — GLUCOSE, CAPILLARY
Glucose-Capillary: 59 mg/dL — ABNORMAL LOW (ref 70–99)
Glucose-Capillary: 102 mg/dL — ABNORMAL HIGH (ref 70–99)
Glucose-Capillary: 126 mg/dL — ABNORMAL HIGH (ref 70–99)

## 2019-02-20 NOTE — Progress Notes (Signed)
Physical Therapy Treatment Patient Details Name: Arthur [name] MRN: [id number] DOB: 1933/10/07 Today's Date: 02/20/2019    History of Present Illness Pt is an 86 y.o. male s/p L2 repeat kyphoplasty 2/16 secondary closed wedge fx of L2.  PMH includes anemia, CHF, CKD, COPD, DM, 2 L home O2, htn, L2 and L3 kyphoplasty, dyspnea when active.    PT Comments    Pt was long sitting in bed upon arriving. He agrees to PT session and is anxious to get OOB. On 2 L O2 throughout. Pt is alert but has some baseline cognition deficits. He was able to follow commands throughout session and was very pleasant. Pt was able to exit L side of bed via log roll + max assist to transition from side-lying to short sit. Increased time to perform with vcs throughout. He was able to stand from recliner and bed to RW with gait belt for safety + Min assist. Pt then ambulated 20 ft with RW CGA but fatigued quickly and was returned to recliner. Lunch tray arrived. Pt tolerated session well and is progressing with PT. Continued recommendation for d/c to SNF.    Follow Up Recommendations  SNF     Equipment Recommendations  Rolling walker with 5" wheels;3in1 (PT)    Recommendations for Other Services       Precautions / Restrictions Precautions Precautions: Fall;Back Precaution Booklet Issued: No Precaution Comments: s/p L2 kyphoplasty Restrictions Weight Bearing Restrictions: No    Mobility  Bed Mobility Overal bed mobility: Needs Assistance Bed Mobility: Rolling;Sidelying to Sit Rolling: Min assist Sidelying to sit: Max assist       General bed mobility comments: Pt was able to log roll L to short sit with min assist + increased time to roll but required max assist to achieve EOB sitting from side-lying. Vcs throughout for technique, sequencing, and safety.   Transfers Overall transfer level: Needs assistance Equipment used: Rolling walker (2 wheeled) Transfers: Sit to/from Stand Sit to Stand: Min  assist         General transfer comment: Min assist to stand from lower bed height and from recliner with vcs for handplacement and technique improvements. He tolerated well but has poor carry-over between trials.  Ambulation/Gait Ambulation/Gait assistance: Min guard Gait Distance (Feet): 20 Feet Assistive device: Rolling walker (2 wheeled) Gait Pattern/deviations: WFL(Within Functional Limits) Gait velocity: decreased   General Gait Details: pt was able to ambulate with RW 20 ft prior to fatigue and requesting seated rest. He demonstarted no LOB or unsteadiness however required constant vcs for posture correction and increased BOS   Stairs             Wheelchair Mobility    Modified Rankin (Stroke Patients Only)       Balance                                            Cognition Arousal/Alertness: Awake/alert Behavior During Therapy: WFL for tasks assessed/performed Overall Cognitive Status: Impaired/Different from baseline Area of Impairment: Memory                     Memory: Decreased recall of precautions;Decreased short-term memory         General Comments: Pt was A and O x 2. Unable to state spinal precautions however was able to adhere throughout session. Pt was able to follow commands  throughout.      Exercises      General Comments        Pertinent Vitals/Pain Pain Assessment: No/denies pain Faces Pain Scale: No hurt Pain Descriptors / Indicators: Sore Pain Intervention(s): Monitored during session    Home Living                      Prior Function            PT Goals (current goals can now be found in the care plan section) Acute Rehab PT Goals Patient Stated Goal: " To get better so I can return home" Progress towards PT goals: Progressing toward goals    Frequency    7X/week      PT Plan Current plan remains appropriate    Co-evaluation              AM-PAC PT "6 Clicks"  Mobility   Outcome Measure  Help needed turning from your back to your side while in a flat bed without using bedrails?: A Lot Help needed moving from lying on your back to sitting on the side of a flat bed without using bedrails?: A Lot Help needed moving to and from a bed to a chair (including a wheelchair)?: A Lot Help needed standing up from a chair using your arms (e.g., wheelchair or bedside chair)?: A Lot Help needed to walk in hospital room?: A Lot Help needed climbing 3-5 steps with a railing? : A Lot 6 Click Score: 12    End of Session Equipment Utilized During Treatment: Gait belt Activity Tolerance: Patient tolerated treatment well Patient left: in chair;with call bell/phone within reach;with chair alarm set Nurse Communication: Mobility status;Precautions PT Visit Diagnosis: Other abnormalities of gait and mobility (R26.89);Muscle weakness (generalized) (M62.81);Difficulty in walking, not elsewhere classified (R26.2)     Time: 9675-9163 PT Time Calculation (min) (ACUTE ONLY): 21 min  Charges:  $Therapeutic Activity: 8-22 mins                     Julaine Fusi PTA 02/20/19, 12:39 PM

## 2019-02-20 NOTE — TOC Progression Note (Signed)
Transition of Care [facility]) - Progression Note    Patient Details  Name: Arthur [name] MRN: [id number] Date of Birth: 1933-06-24  Transition of Care [facility]) CM/SW Contact  Su Hilt, RN Phone Number: 02/20/2019, 1:19 PM  Clinical Narrative:   Almyra Free to check on insurance auth, they have approved auth.  I notified Magda Paganini and will send the patient, the DC summary and orders have been sent thru the Hub, Magda Paganini said the only thing they were waiting oin it auth.  Will call EMS to transport         Expected Discharge Plan and Services           Expected Discharge Date: 02/20/19                                     Social Determinants of Health (SDOH) Interventions    Readmission Risk Interventions Readmission Risk Prevention Plan 11/05/2018 10/29/2018 08/09/2018  Transportation Screening Complete Complete Complete  PCP or Specialist Appt within 5-7 Days - - -  PCP or Specialist Appt within 3-5 Days - - -  Home Care Screening - - -  Medication Review (RN CM) - - -  [place] or [place] Work Consult for [place] - - -  Medication Review Press photographer) Complete Complete Complete  PCP or Specialist appointment within 3-5 days of discharge (No Data) - Complete  HRI or Home Care Consult - - Complete  SW Recovery Care/Counseling Consult - - Complete  Palliative Care Screening Not Applicable - Not Applicable  Skilled Nursing Facility Complete - Not Applicable  Some recent data might be hidden

## 2019-02-20 NOTE — TOC Progression Note (Signed)
Transition of Care [facility]) - Progression Note    Patient Details  Name: Arthur [name] MRN: [id number] Date of Birth: 1933/01/19  Transition of Care [facility]) CM/SW [place], RN Phone Number: 02/20/2019, 11:40 AM  Clinical Narrative:   Damaris Schooner to Magda Paganini at [facility], I notified her that the notes from OT from yesterday are in [place], a rapid covid has been ordered and will be completed today She has sent inforamtion to [facility] and will let me know once received and he can DC there today         Expected Discharge Plan and Services           Expected Discharge Date: 02/20/19                                     Social Determinants of Health (SDOH) Interventions    Readmission Risk Interventions Readmission Risk Prevention Plan 11/05/2018 10/29/2018 08/09/2018  Transportation Screening Complete Complete Complete  PCP or Specialist Appt within 5-7 Days - - -  PCP or Specialist Appt within 3-5 Days - - -  Home Care Screening - - -  Medication Review (RN CM) - - -  [place] or [place] for [place] - - -  Medication Review Press photographer) Complete Complete Complete  PCP or Specialist appointment within 3-5 days of discharge (No Data) - Complete  HRI or Home Care Consult - - Complete  SW Recovery Care/Counseling Consult - - Complete  Palliative Care Screening Not Applicable - Not Applicable  Skilled Nursing Facility Complete - Not Applicable  Some recent data might be hidden

## 2019-02-20 NOTE — Progress Notes (Signed)
Inpatient Diabetes Program Recommendations  AACE/ADA: New Consensus Statement on Inpatient Glycemic Control   Target Ranges:  Prepandial:   less than 140 mg/dL      Peak postprandial:   less than 180 mg/dL (1-2 hours)      Critically ill patients:  140 - 180 mg/dL   Results for Arthur [name], Arthur [name] (MRN [id number]) as of 02/20/2019 10:53  Ref. Range 02/19/2019 08:01 02/19/2019 11:44 02/19/2019 16:49 02/19/2019 20:50 02/20/2019 08:21  Glucose-Capillary Latest Ref Range: 70 - 99 mg/dL 199 (H) 218 (H) 176 (H) 142 (H) 59 (L)    Review of Glycemic Control  Diabetes history: DM2 Outpatient Diabetes medications: Levemir 5-25 units QHS, Novolog 0-15 units TID with meals (hold if CBG less than 100 mg/dl) Current orders for Inpatient glycemic control: Levemir 25 units QHS, Novolog 0-15 units TID with tmeals  Inpatient Diabetes Program Recommendations:   Insulin - Basal: Fasting glucose 59 mg/dl today. Please consider decreasing Levemir to 20 units QHS.  Thanks, Barnie Alderman, RN, MSN, CDE Diabetes Coordinator Inpatient Diabetes Program [phone] (Team Pager from 8am to 5pm)

## 2019-02-20 NOTE — TOC Transition Note (Signed)
Transition of Care [facility]) - CM/SW Discharge Note   Patient Details  Name: Arthur [name] MRN: [id number] Date of Birth: Oct 17, 1933  Transition of [facility]) CM/SW Contact:  Su Hilt, RN Phone Number: 02/20/2019, 1:54 PM   Clinical Narrative:     The patient to DC to [facility] via EMS transport, The bedside Nurse to call report to [facility], I called the patient's daughter and let her know about the DC, EMS has been called and The DC sent thru the Hub   Final next level of care: Skilled Nursing Facility Barriers to Discharge: Barriers Resolved   Patient Goals and CMS Choice        Discharge Placement              Patient chooses bed at: [facility] Patient to be transferred to facility by: EMS Name of family member notified: [name] Daughter Patient and family notified of of transfer: 02/20/19  Discharge Plan and Services                                     Social Determinants of Health (SDOH) Interventions     Readmission Risk Interventions Readmission Risk Prevention Plan 11/05/2018 10/29/2018 08/09/2018  Transportation Screening Complete Complete Complete  PCP or Specialist Appt within 5-7 Days - - -  PCP or Specialist Appt within 3-5 Days - - -  Home Care Screening - - -  Medication Review (RN CM) - - -  [place] or [place] Work Consult for [place] - - -  Medication Review Press photographer) Complete Complete Complete  PCP or Specialist appointment within 3-5 days of discharge (No Data) - Complete  HRI or Home Care Consult - - Complete  SW Recovery Care/Counseling Consult - - Complete  Palliative Care Screening Not Applicable - Not Applicable  Skilled Nursing Facility Complete - Not Applicable  Some recent data might be hidden

## 2019-02-20 NOTE — Progress Notes (Signed)
   Subjective: 2 Days Post-Op Procedure(s) (LRB): L2 KYPHOPLASTY (N/A) Patient reports pain as mild.  Overall back pain improved.  Less pain with sitting in bed, standing and movement. Patient is well, and has had no acute complaints or problems Denies any CP, SOB, ABD pain. We will continue with physical therapy today.  Objective: Vital signs in last 24 hours: Temp:  [97.8 F (36.6 C)-98.6 F (37 C)] 98.6 F (37 C) (02/18 0821) Pulse Rate:  [65-78] 65 (02/18 0821) Resp:  [18] 18 (02/18 0821) BP: (119-155)/(51-52) 155/52 (02/18 0821) SpO2:  [96 %-100 %] 97 % (02/18 0821)  Intake/Output from previous day: 02/17 0701 - 02/18 0700 In: 480 [P.O.:480] Out: 1850 [Urine:1850] Intake/Output this shift: No intake/output data recorded.  No results for input(s): HGB in the last 72 hours. No results for input(s): WBC, RBC, HCT, PLT in the last 72 hours. No results for input(s): NA, K, CL, CO2, BUN, CREATININE, GLUCOSE, CALCIUM in the last 72 hours. No results for input(s): LABPT, INR in the last 72 hours.  EXAM General - Patient is Alert, Appropriate and Oriented Extremity - Neurovascular intact Sensation intact distally Intact pulses distally Dressing - dressing C/D/I and no drainage Motor Function - intact, moving foot and toes well on exam.     Past Medical History:  Diagnosis Date  . Anemia of chronic kidney failure 08/21/2018  . Arthritis    lower back  . CHF (congestive heart failure) ([place])   . CKD (chronic kidney disease), stage III   . COPD (chronic obstructive pulmonary disease) ([place])   . Diabetes mellitus without complication ([place])    type 2  . Dyspnea    when active -wears oxygen 2L via [place]  . History of blood transfusion   . Hyperlipidemia   . Hypertension   . Requires continuous at home supplemental oxygen    2L via [place]    Assessment/Plan:   2 Days Post-Op Procedure(s) (LRB): L2 KYPHOPLASTY (N/A) Active Problems:   S/P kyphoplasty  Estimated body mass  index is 31.48 kg/m as calculated from the following:   Height as of 02/14/19: 5\' 7"  (1.702 m).   Weight as of 02/14/19: 91.2 kg. Advance diet Up with therapy Plan on discharge to [facility] today. Follow-up with [facility] orthopedics in 2 weeks for x-rays.   Ronney Asters, PA-C [place] 02/20/2019, 8:45 AM

## 2019-02-21 ENCOUNTER — Emergency Department
Admission: EM | Admit: 2019-02-21 | Discharge: 2019-02-21 | Disposition: A | Discharge status: Home / Self Care | Payer: Medicare HMO | Attending: Emergency Medicine | Admitting: Emergency Medicine

## 2019-02-21 ENCOUNTER — Other Ambulatory Visit: Payer: Self-pay

## 2019-02-21 ENCOUNTER — Encounter: Payer: Self-pay | Admitting: Emergency Medicine

## 2019-02-21 DIAGNOSIS — D649 Anemia, unspecified: Secondary | ICD-10-CM | POA: Insufficient documentation

## 2019-02-21 DIAGNOSIS — E1122 Type 2 diabetes mellitus with diabetic chronic kidney disease: Secondary | ICD-10-CM | POA: Insufficient documentation

## 2019-02-21 DIAGNOSIS — Z794 Long term (current) use of insulin: Secondary | ICD-10-CM | POA: Insufficient documentation

## 2019-02-21 DIAGNOSIS — I509 Heart failure, unspecified: Secondary | ICD-10-CM | POA: Insufficient documentation

## 2019-02-21 DIAGNOSIS — F17220 Nicotine dependence, chewing tobacco, uncomplicated: Secondary | ICD-10-CM | POA: Diagnosis not present

## 2019-02-21 DIAGNOSIS — Z79899 Other long term (current) drug therapy: Secondary | ICD-10-CM | POA: Insufficient documentation

## 2019-02-21 DIAGNOSIS — N184 Chronic kidney disease, stage 4 (severe): Secondary | ICD-10-CM | POA: Diagnosis not present

## 2019-02-21 DIAGNOSIS — J449 Chronic obstructive pulmonary disease, unspecified: Secondary | ICD-10-CM | POA: Diagnosis not present

## 2019-02-21 DIAGNOSIS — I130 Hypertensive heart and chronic kidney disease with heart failure and stage 1 through stage 4 chronic kidney disease, or unspecified chronic kidney disease: Secondary | ICD-10-CM | POA: Insufficient documentation

## 2019-02-21 LAB — COMPREHENSIVE METABOLIC PANEL
ALT: 6 U/L (ref 0–44)
AST: 11 U/L — ABNORMAL LOW (ref 15–41)
Albumin: 3.2 g/dL — ABNORMAL LOW (ref 3.5–5.0)
Alkaline Phosphatase: 67 U/L (ref 38–126)
Anion gap: 12 (ref 5–15)
BUN: 30 mg/dL — ABNORMAL HIGH (ref 8–23)
CO2: 29 mmol/L (ref 22–32)
Calcium: 8.6 mg/dL — ABNORMAL LOW (ref 8.9–10.3)
Chloride: 94 mmol/L — ABNORMAL LOW (ref 98–111)
Creatinine, Ser: 1.74 mg/dL — ABNORMAL HIGH (ref 0.61–1.24)
GFR calc Af Amer: 41 mL/min — ABNORMAL LOW (ref >60)
GFR calc non Af Amer: 35 mL/min — ABNORMAL LOW (ref >60)
Glucose, Bld: 120 mg/dL — ABNORMAL HIGH (ref 70–99)
Potassium: 4.2 mmol/L (ref 3.5–5.1)
Sodium: 135 mmol/L (ref 135–145)
Total Bilirubin: 0.6 mg/dL (ref 0.3–1.2)
Total Protein: 7.2 g/dL (ref 6.5–8.1)

## 2019-02-21 LAB — CBC
HCT: 23.3 % — ABNORMAL LOW (ref 39.0–52.0)
Hemoglobin: 7.2 g/dL — ABNORMAL LOW (ref 13.0–17.0)
MCH: 29 pg (ref 26.0–34.0)
MCHC: 30.9 g/dL (ref 30.0–36.0)
MCV: 94 fL (ref 80.0–100.0)
Platelets: 343 10*3/uL (ref 150–400)
RBC: 2.48 MIL/uL — ABNORMAL LOW (ref 4.22–5.81)
RDW: 18.5 % — ABNORMAL HIGH (ref 11.5–15.5)
WBC: 7.1 10*3/uL (ref 4.0–10.5)
nRBC: 0.4 % — ABNORMAL HIGH (ref 0.0–0.2)

## 2019-02-21 LAB — TYPE AND SCREEN
ABO/RH(D): A POS
Antibody Screen: NEGATIVE

## 2019-02-21 MED ORDER — ONDANSETRON 4 MG PO TBDP
4.0000 mg | ORAL_TABLET | Freq: Once | ORAL | Status: AC | PRN
  Administered 2019-02-21: 4 mg via ORAL
  Filled 2019-02-21: qty 1

## 2019-02-21 NOTE — ED Notes (Signed)
Pt DNR left at ER upon discharge.  This RN spoke to Bryant, daughter, who states will pick it up tomorrow 2/20 to take back to [facility].  Left in packet with patient sticker with first nurse and instructions for daughter where to pick up.

## 2019-02-21 NOTE — ED Provider Notes (Signed)
[facility] Emergency Department Provider Note  ____________________________________________   I have reviewed the triage vital signs and the nursing notes.   HISTORY  Chief Complaint Anemia  History limited by: Not Limited   HPI Arthur [name] is a 86 y.o. male who presents to the emergency department today because of concerns for low hemoglobin found on outpatient blood work.  Patient states that he has not noticed any bleeding. The patient denies any weakness or shortness of breath. States he has black stools but he says this is usual for him since he is on iron.   Records reviewed. Per medical record review patient has a history of anemia, recent surgery.   Past Medical History:  Diagnosis Date  . Anemia of chronic kidney failure 08/21/2018  . Arthritis    lower back  . CHF (congestive heart failure) ([place])   . CKD (chronic kidney disease), stage III   . COPD (chronic obstructive pulmonary disease) ([place])   . Diabetes mellitus without complication ([place])    type 2  . Dyspnea    when active -wears oxygen 2L via [place]  . History of blood transfusion   . Hyperlipidemia   . Hypertension   . Requires continuous at home supplemental oxygen    2L via [place]    Patient Active Problem List   Diagnosis Date Noted  . S/P kyphoplasty 02/18/2019  . Goals of care, counseling/discussion 01/16/2019  . CKD (chronic kidney disease) stage 4, GFR 15-29 ml/min (HCC) 01/16/2019  . CKD stage 4 due to type 2 diabetes mellitus ([place])   . Closed compression fracture of second lumbar vertebra ([place])   . CKD stage 3 due to type 2 diabetes mellitus ([place])   . Chronic respiratory failure with hypoxia ([place])   . Benign prostatic hyperplasia without lower urinary tract symptoms   . History of anemia due to chronic kidney disease   . Anemia of chronic kidney failure 08/21/2018  . UTI (urinary tract infection) 08/07/2018  . Acute on chronic renal failure ([place]) 08/07/2018  . HLD  (hyperlipidemia) 07/24/2018  . Acute on chronic respiratory failure with hypoxia ([place]) 07/06/2018  . Symptomatic anemia 06/27/2018  . GI bleeding 06/09/2018  . Sepsis secondary to UTI ([place]) 05/23/2018  . Cellulitis of right lower extremity 05/22/2018  . Diabetes ([place]) 10/24/2006  . OBESITY 10/24/2006  . Essential hypertension 10/24/2006  . ALLERGIC RHINITIS 10/24/2006  . COPD (chronic obstructive pulmonary disease) ([place]) 10/24/2006  . TOBACCO ABUSE, HX OF 10/24/2006    Past Surgical History:  Procedure Laterality Date  . COLONOSCOPY WITH PROPOFOL N/A 06/11/2018   Procedure: COLONOSCOPY WITH PROPOFOL;  Surgeon: Jonathon Bellows, MD;  [facility] END[facility]COPY;  Service: Gastroenterology;  Laterality: N/A;  . COLONOSCOPY WITH PROPOFOL N/A 06/13/2018   Procedure: COLONOSCOPY WITH PROPOFOL;  Surgeon: Jonathon Bellows, MD;  Location: [facility] ENDOSCOPY;  Service: Gastroenterology;  Laterality: N/A;  . ESOPHAGOGASTRODUODENOSCOPY Left 06/10/2018   Procedure: ESOPHAGOGASTRODUODENOSCOPY (EGD);  Surgeon: Jonathon Bellows, MD;  Location: [facility] ENDOSCOPY;  Service: Gastroenterology;  Laterality: Left;  . EYE SURGERY     removed cataracts  . GIVENS CAPSULE STUDY N/A 06/28/2018   Procedure: GIVENS CAPSULE STUDY;  Surgeon: Lucilla Lame, MD;  Location: [facility] ENDOSCOPY;  Service: Endoscopy;  Laterality: N/A;  . GIVENS CAPSULE STUDY N/A 06/30/2018   Procedure: GIVENS CAPSULE STUDY;  Surgeon: Jonathon Bellows, MD;  Location: [facility] ENDOSCOPY;  Service: Gastroenterology;  Laterality: N/A;  . KYPHOPLASTY N/A 10/31/2018   Procedure: KYPHOPLASTY L2;  Surgeon: [name],  MD;  Location: [facility] ORS;  Service: Orthopedics;  Laterality: N/A;  . KYPHOPLASTY N/A 12/24/2018   Procedure: KYPHOPLASTY L3;  Surgeon: [name], MD;  Location: [facility] ORS;  Service: Orthopedics;  Laterality: N/A;  . KYPHOPLASTY N/A 02/18/2019   Procedure: L2 KYPHOPLASTY;  Surgeon: [name], MD;  Location: [facility] ORS;  Service: Orthopedics;  Laterality: N/A;  .  RADIOLOGY WITH ANESTHESIA N/A 01/28/2019   Procedure: MRI LUMBER SPINE WITHOUT CONTRAST;  Surgeon: Radiologist, Medication, MD;  Location: [place];  Service: Radiology;  Laterality: N/A;    Prior to Admission medications   Medication Sig Start Date End Date Taking? Authorizing Provider  albuterol (VENTOLIN HFA) 108 (90 Base) MCG/ACT inhaler Inhale 2 puffs into the lungs every 6 (six) hours as needed for wheezing or shortness of breath. 05/27/18   Demetrios Loll, MD  albuterol (VENTOLIN HFA) 108 (90 Base) MCG/ACT inhaler Inhale 4 puffs into the lungs every 12 (twelve) hours. 02/19/19   Duanne Guess, PA-C  amLODipine (NORVASC) 2.5 MG tablet Take 2.5 mg by mouth daily.    [provider]  ascorbic acid (VITAMIN C) 500 MG tablet Take 500 mg by mouth at bedtime.    [provider]  docusate sodium (COLACE) 100 MG capsule Take 1 capsule (100 mg total) by mouth 2 (two) times daily. Patient taking differently: Take 100 mg by mouth every other day.  11/05/18   Loletha Grayer, MD  HYDROcodone-acetaminophen (NORCO) 5-325 MG tablet Take 1 tablet by mouth every 6 (six) hours as needed. 02/19/19   Duanne Guess, PA-C  insulin aspart (NOVOLOG) 100 UNIT/ML injection Inject 5 Units into the skin 3 (three) times daily with meals. Patient taking differently: Inject 0-15 Units into the skin 3 (three) times daily before meals. Skip dose if blood sugar is under 100 11/05/18   Loletha Grayer, MD  iron polysaccharides (NIFEREX) 150 MG capsule Take 1 capsule (150 mg total) by mouth daily. Patient taking differently: Take 150 mg by mouth every evening.  07/11/18   Fritzi Mandes, MD  LEVEMIR 100 UNIT/ML injection Inject 0.12 mLs (12 Units total) into the skin daily. Patient taking differently: Inject 5-25 Units into the skin at bedtime.  07/10/18   Fritzi Mandes, MD  pantoprazole (PROTONIX) 40 MG tablet Take 1 tablet (40 mg total) by mouth 2 (two) times daily. 06/14/18   Lang Snow, NP  polyethylene  glycol powder (GLYCOLAX/MIRALAX) 17 GM/SCOOP powder Take 17 g by mouth every other day.  01/08/19   [provider]  potassium chloride (KLOR-CON) 10 MEQ tablet Take 10 mEq by mouth every evening.    [provider]  pravastatin (PRAVACHOL) 10 MG tablet Take 10 mg by mouth at bedtime. 04/25/18   [provider]  tamsulosin (FLOMAX) 0.4 MG CAPS capsule Take 0.4 mg by mouth daily. 04/25/18   [provider]  torsemide (DEMADEX) 20 MG tablet Take 20 mg by mouth every Monday, Wednesday, and Friday.    [provider]    Allergies Codeine  Family History  Problem Relation Age of Onset  . COPD Mother   . Cancer Father     Social History Social History   Tobacco Use  . Smoking status: Former Smoker    Packs/day: 2.00    Years: 35.00    Pack years: 70.00    Types: Cigarettes    Quit date: 11/04/1982    Years since quitting: 36.3  . Smokeless tobacco: Current User    Types: Chew  Substance  Use Topics  . Alcohol use: Not Currently  . Drug use: No    Review of Systems Constitutional: No fever/chills Eyes: No visual changes. ENT: No sore throat. Cardiovascular: Denies chest pain. Respiratory: Denies shortness of breath. Gastrointestinal: No abdominal pain.  No nausea, no vomiting.  No diarrhea.   Genitourinary: Negative for dysuria. Musculoskeletal: Negative for back pain. Skin: Negative for rash. Neurological: Negative for headaches, focal weakness or numbness.  ____________________________________________   PHYSICAL EXAM:  VITAL SIGNS: ED Triage Vitals  Enc Vitals Group     BP 02/21/19 1844 (!) 151/54     Pulse Rate 02/21/19 1844 73     Resp 02/21/19 1844 18     Temp 02/21/19 1844 98.3 F (36.8 C)     Temp Source 02/21/19 1844 Oral     SpO2 02/21/19 1844 98 %     Weight 02/21/19 1841 201 lb (91.2 kg)     Height 02/21/19 1841 5\' 7"  (1.702 m)     Head Circumference --      Peak Flow --      Pain Score 02/21/19 1840 0    Constitutional: Alert and oriented.  Eyes: Conjunctivae are normal.  ENT      Head: Normocephalic and atraumatic.      Nose: No congestion/rhinnorhea.      Mouth/Throat: Mucous membranes are moist.      Neck: No stridor. Hematological/Lymphatic/Immunilogical: No cervical lymphadenopathy. Cardiovascular: Normal rate, regular rhythm.  No murmurs, rubs, or gallops.  Respiratory: Normal respiratory effort without tachypnea nor retractions. Breath sounds are clear and equal bilaterally. No wheezes/rales/rhonchi. Gastrointestinal: Soft and non tender. No rebound. No guarding.  Rectal: GUIAC negative Musculoskeletal: Normal range of motion in all extremities. No lower extremity edema. Neurologic:  Normal speech and language. No gross focal neurologic deficits are appreciated.  Skin:  Skin is warm, dry and intact. No rash noted. Psychiatric: Mood and affect are normal. Speech and behavior are normal. Patient exhibits appropriate insight and judgment.  ____________________________________________    LABS (pertinent positives/negatives)  CMP na 135, k 4.2, glu 120, cr 1.74 CBC wbc 7.1, hgb 7.2, plt 343  ____________________________________________   EKG  None  ____________________________________________    RADIOLOGY  None  ____________________________________________   PROCEDURES  Procedures  ____________________________________________   INITIAL IMPRESSION / ASSESSMENT AND PLAN / ED COURSE  Pertinent labs & imaging results that were available during my care of the patient were reviewed by me and considered in my medical decision making (see chart for details).   Patient sent to the emergency department from living facility because of concerns for anemia.  Patient's hemoglobin here is 7.2.  While this is low this is not dramatically different from the patient's baseline.  Patient states he is asymptomatic.  He did recently have a surgery and I wonder if it could have  dropped after his surgery.  Guaiac negative.  The patient is asymptomatic and he is not far from his baseline I do not think patient he requires any emergent blood or treatment.  I do think it is reasonable for patient to be discharged back to living facility for continued observation. He is on iron currently.  ____________________________________________   FINAL CLINICAL IMPRESSION(S) / ED DIAGNOSES  Final diagnoses:  Anemia, unspecified type     Note: This dictation was prepared with Dragon dictation. Any transcriptional errors that result from this process are unintentional     Nance Pear, MD 02/21/19 2133

## 2019-02-21 NOTE — ED Notes (Signed)
Bedside rectal exam done by Dr. Archie Balboa and this RN.  No rectal bleeding noted, patient denies new bloody stools or changes, states always has darker stools due to Iron he takes.  Patient denies complaints at this time.

## 2019-02-21 NOTE — ED Notes (Signed)
Pt gave verbal consent of discharge instructions but having difficulty signing signature pad at this time.

## 2019-02-21 NOTE — ED Triage Notes (Signed)
Pt presents to ED via [facility] from [facility] c/o hemoglobin 6.5, recheck at SNF at 6.7.

## 2019-02-21 NOTE — ED Notes (Signed)
Pt's daughter called to ck in pt Arthur [name], informed waiting on room to be seen by MD

## 2019-02-21 NOTE — ED Notes (Signed)
Report called to Museum/gallery conservator at [facility]. She stated that there was no Lucianne Lei available to take him back and he should be transported by EMS.

## 2019-02-21 NOTE — Discharge Instructions (Addendum)
Please seek medical attention for any high fevers, chest pain, shortness of breath, change in behavior, persistent vomiting, bloody stool or any other new or concerning symptoms.  

## 2019-02-21 NOTE — ED Triage Notes (Signed)
First nuse note: arrived by EMS from [facility]. Reports abnormal hemoglobin 6.5. Tested for covid today and being monitored. DNR present. Vital signs WNL

## 2019-02-24 ENCOUNTER — Telehealth: Payer: Self-pay | Admitting: Nurse Practitioner

## 2019-02-24 ENCOUNTER — Telehealth: Payer: Self-pay

## 2019-02-24 ENCOUNTER — Other Ambulatory Visit: Payer: Medicare HMO | Admitting: Nurse Practitioner

## 2019-02-24 ENCOUNTER — Other Ambulatory Visit: Payer: Self-pay

## 2019-02-24 DIAGNOSIS — J9611 Chronic respiratory failure with hypoxia: Secondary | ICD-10-CM | POA: Diagnosis not present

## 2019-02-24 DIAGNOSIS — S32020D Wedge compression fracture of second lumbar vertebra, subsequent encounter for fracture with routine healing: Secondary | ICD-10-CM | POA: Diagnosis not present

## 2019-02-24 DIAGNOSIS — Z794 Long term (current) use of insulin: Secondary | ICD-10-CM | POA: Diagnosis not present

## 2019-02-24 DIAGNOSIS — J431 Panlobular emphysema: Secondary | ICD-10-CM | POA: Diagnosis not present

## 2019-02-24 DIAGNOSIS — E1121 Type 2 diabetes mellitus with diabetic nephropathy: Secondary | ICD-10-CM | POA: Diagnosis not present

## 2019-02-24 NOTE — Telephone Encounter (Signed)
I called Arthur [name], Arthur [name] daughter for scheduled PC telemedicine f/u visit. Currently Arthur [name] at [facility] for [facility]. Will request PC consult at [place] to continue to follow per Lisa's request.

## 2019-02-24 NOTE — Telephone Encounter (Signed)
Per message from [employer]: pt is at [facility] with a fracture L 2 daughter wanted [place] to know when do we need to see him for his venofer he was down for it along with lab md on 2/19  Per [name] ok to reschedule: lab/MD/poss blood or retacrit. Scheduling notified to contact pts daughter for appt set up.

## 2019-02-24 NOTE — Telephone Encounter (Signed)
Contacted patient's daughter and she is concerned about her father's conditoin given his history of low Hgb. She is wanting for him to follow up with [name] even if he is at [facility]. Scheduling will set up appt and contact [name] (daughter) wit appt details.

## 2019-02-28 ENCOUNTER — Telehealth: Payer: Self-pay

## 2019-02-28 NOTE — Telephone Encounter (Signed)
Per [place] message from Liana Crocker : "atient is now at [facility]. Latia from [facility] phoned and asked if there was any way that his appts could be rescheduled for after he leaves the facility. no date given as to when he might discharge. please advise and please call her back at [phone]. thanks!  Contacted Latia at [facility] and notified her that [name] doesn't recommend delaying appts due to history of low hemoglobin & blood tranfusions. Per Holli Humbles, transportation will be set up for pts appt on 3/4. I asked about getting labwork at the facility and she said that labs are drawn on thursdays but they do not administer retacrit or blood.

## 2019-03-03 DIAGNOSIS — D649 Anemia, unspecified: Secondary | ICD-10-CM | POA: Diagnosis not present

## 2019-03-03 DIAGNOSIS — J441 Chronic obstructive pulmonary disease with (acute) exacerbation: Secondary | ICD-10-CM | POA: Diagnosis not present

## 2019-03-04 ENCOUNTER — Encounter: Payer: Self-pay | Admitting: Emergency Medicine

## 2019-03-04 ENCOUNTER — Emergency Department: Payer: Medicare HMO

## 2019-03-04 ENCOUNTER — Other Ambulatory Visit: Payer: Self-pay

## 2019-03-04 ENCOUNTER — Inpatient Hospital Stay
Admission: EM | Admit: 2019-03-04 | Discharge: 2019-03-10 | DRG: 177 | Disposition: A | Discharge status: Skilled Nursing Facility | Payer: Medicare HMO | Source: Skilled Nursing Facility | Attending: Internal Medicine | Admitting: Internal Medicine

## 2019-03-04 DIAGNOSIS — Z20822 Contact with and (suspected) exposure to covid-19: Secondary | ICD-10-CM | POA: Diagnosis not present

## 2019-03-04 DIAGNOSIS — Z7189 Other specified counseling: Secondary | ICD-10-CM | POA: Diagnosis not present

## 2019-03-04 DIAGNOSIS — D631 Anemia in chronic kidney disease: Secondary | ICD-10-CM | POA: Diagnosis present

## 2019-03-04 DIAGNOSIS — N400 Enlarged prostate without lower urinary tract symptoms: Secondary | ICD-10-CM | POA: Diagnosis not present

## 2019-03-04 DIAGNOSIS — D62 Acute posthemorrhagic anemia: Secondary | ICD-10-CM | POA: Diagnosis not present

## 2019-03-04 DIAGNOSIS — I509 Heart failure, unspecified: Secondary | ICD-10-CM | POA: Diagnosis not present

## 2019-03-04 DIAGNOSIS — Z885 Allergy status to narcotic agent status: Secondary | ICD-10-CM | POA: Diagnosis not present

## 2019-03-04 DIAGNOSIS — J948 Other specified pleural conditions: Secondary | ICD-10-CM | POA: Diagnosis not present

## 2019-03-04 DIAGNOSIS — I130 Hypertensive heart and chronic kidney disease with heart failure and stage 1 through stage 4 chronic kidney disease, or unspecified chronic kidney disease: Secondary | ICD-10-CM | POA: Diagnosis not present

## 2019-03-04 DIAGNOSIS — M47819 Spondylosis without myelopathy or radiculopathy, site unspecified: Secondary | ICD-10-CM | POA: Diagnosis present

## 2019-03-04 DIAGNOSIS — J90 Pleural effusion, not elsewhere classified: Secondary | ICD-10-CM | POA: Diagnosis not present

## 2019-03-04 DIAGNOSIS — Z515 Encounter for palliative care: Secondary | ICD-10-CM | POA: Diagnosis not present

## 2019-03-04 DIAGNOSIS — Z9181 History of falling: Secondary | ICD-10-CM

## 2019-03-04 DIAGNOSIS — Z7401 Bed confinement status: Secondary | ICD-10-CM | POA: Diagnosis not present

## 2019-03-04 DIAGNOSIS — R1110 Vomiting, unspecified: Secondary | ICD-10-CM | POA: Diagnosis not present

## 2019-03-04 DIAGNOSIS — N1831 Chronic kidney disease, stage 3a: Secondary | ICD-10-CM

## 2019-03-04 DIAGNOSIS — Z720 Tobacco use: Secondary | ICD-10-CM | POA: Diagnosis not present

## 2019-03-04 DIAGNOSIS — Z9889 Other specified postprocedural states: Secondary | ICD-10-CM | POA: Diagnosis not present

## 2019-03-04 DIAGNOSIS — J690 Pneumonitis due to inhalation of food and vomit: Principal | ICD-10-CM | POA: Diagnosis present

## 2019-03-04 DIAGNOSIS — R531 Weakness: Secondary | ICD-10-CM

## 2019-03-04 DIAGNOSIS — N1832 Chronic kidney disease, stage 3b: Secondary | ICD-10-CM | POA: Diagnosis present

## 2019-03-04 DIAGNOSIS — I5032 Chronic diastolic (congestive) heart failure: Secondary | ICD-10-CM | POA: Diagnosis present

## 2019-03-04 DIAGNOSIS — E1122 Type 2 diabetes mellitus with diabetic chronic kidney disease: Secondary | ICD-10-CM | POA: Diagnosis not present

## 2019-03-04 DIAGNOSIS — D649 Anemia, unspecified: Secondary | ICD-10-CM | POA: Diagnosis not present

## 2019-03-04 DIAGNOSIS — N184 Chronic kidney disease, stage 4 (severe): Secondary | ICD-10-CM

## 2019-03-04 DIAGNOSIS — J189 Pneumonia, unspecified organism: Secondary | ICD-10-CM | POA: Diagnosis not present

## 2019-03-04 DIAGNOSIS — Z794 Long term (current) use of insulin: Secondary | ICD-10-CM

## 2019-03-04 DIAGNOSIS — R0602 Shortness of breath: Secondary | ICD-10-CM | POA: Diagnosis not present

## 2019-03-04 DIAGNOSIS — Z66 Do not resuscitate: Secondary | ICD-10-CM

## 2019-03-04 DIAGNOSIS — K5900 Constipation, unspecified: Secondary | ICD-10-CM

## 2019-03-04 DIAGNOSIS — Z825 Family history of asthma and other chronic lower respiratory diseases: Secondary | ICD-10-CM

## 2019-03-04 DIAGNOSIS — E785 Hyperlipidemia, unspecified: Secondary | ICD-10-CM | POA: Diagnosis present

## 2019-03-04 DIAGNOSIS — R1084 Generalized abdominal pain: Secondary | ICD-10-CM | POA: Diagnosis not present

## 2019-03-04 DIAGNOSIS — R1111 Vomiting without nausea: Secondary | ICD-10-CM | POA: Diagnosis not present

## 2019-03-04 DIAGNOSIS — K219 Gastro-esophageal reflux disease without esophagitis: Secondary | ICD-10-CM

## 2019-03-04 DIAGNOSIS — J9621 Acute and chronic respiratory failure with hypoxia: Secondary | ICD-10-CM | POA: Diagnosis present

## 2019-03-04 DIAGNOSIS — R29898 Other symptoms and signs involving the musculoskeletal system: Secondary | ICD-10-CM | POA: Diagnosis not present

## 2019-03-04 DIAGNOSIS — Z87440 Personal history of urinary (tract) infections: Secondary | ICD-10-CM

## 2019-03-04 DIAGNOSIS — M4856XD Collapsed vertebra, not elsewhere classified, lumbar region, subsequent encounter for fracture with routine healing: Secondary | ICD-10-CM | POA: Diagnosis not present

## 2019-03-04 DIAGNOSIS — Z79899 Other long term (current) drug therapy: Secondary | ICD-10-CM

## 2019-03-04 DIAGNOSIS — R58 Hemorrhage, not elsewhere classified: Secondary | ICD-10-CM | POA: Diagnosis not present

## 2019-03-04 DIAGNOSIS — R112 Nausea with vomiting, unspecified: Secondary | ICD-10-CM | POA: Diagnosis not present

## 2019-03-04 DIAGNOSIS — E876 Hypokalemia: Secondary | ICD-10-CM | POA: Diagnosis present

## 2019-03-04 DIAGNOSIS — I10 Essential (primary) hypertension: Secondary | ICD-10-CM | POA: Diagnosis not present

## 2019-03-04 DIAGNOSIS — R110 Nausea: Secondary | ICD-10-CM | POA: Diagnosis not present

## 2019-03-04 DIAGNOSIS — W19XXXA Unspecified fall, initial encounter: Secondary | ICD-10-CM | POA: Diagnosis not present

## 2019-03-04 DIAGNOSIS — J449 Chronic obstructive pulmonary disease, unspecified: Secondary | ICD-10-CM | POA: Diagnosis present

## 2019-03-04 DIAGNOSIS — M47816 Spondylosis without myelopathy or radiculopathy, lumbar region: Secondary | ICD-10-CM | POA: Diagnosis not present

## 2019-03-04 DIAGNOSIS — M2550 Pain in unspecified joint: Secondary | ICD-10-CM | POA: Diagnosis not present

## 2019-03-04 DIAGNOSIS — I959 Hypotension, unspecified: Secondary | ICD-10-CM | POA: Diagnosis not present

## 2019-03-04 DIAGNOSIS — N183 Chronic kidney disease, stage 3 unspecified: Secondary | ICD-10-CM | POA: Diagnosis not present

## 2019-03-04 LAB — CBC WITH DIFFERENTIAL/PLATELET
Abs Immature Granulocytes: 0.05 10*3/uL (ref 0.00–0.07)
Basophils Absolute: 0.1 10*3/uL (ref 0.0–0.1)
Basophils Relative: 1 %
Eosinophils Absolute: 0.1 10*3/uL (ref 0.0–0.5)
Eosinophils Relative: 1 %
HCT: 23.3 % — ABNORMAL LOW (ref 39.0–52.0)
Hemoglobin: 7.4 g/dL — ABNORMAL LOW (ref 13.0–17.0)
Immature Granulocytes: 1 %
Lymphocytes Relative: 16 %
Lymphs Abs: 1.2 10*3/uL (ref 0.7–4.0)
MCH: 29 pg (ref 26.0–34.0)
MCHC: 31.8 g/dL (ref 30.0–36.0)
MCV: 91.4 fL (ref 80.0–100.0)
Monocytes Absolute: 0.8 10*3/uL (ref 0.1–1.0)
Monocytes Relative: 11 %
Neutro Abs: 5.4 10*3/uL (ref 1.7–7.7)
Neutrophils Relative %: 70 %
Platelets: 366 10*3/uL (ref 150–400)
RBC: 2.55 MIL/uL — ABNORMAL LOW (ref 4.22–5.81)
RDW: 17.6 % — ABNORMAL HIGH (ref 11.5–15.5)
WBC: 7.6 10*3/uL (ref 4.0–10.5)
nRBC: 0 % (ref 0.0–0.2)

## 2019-03-04 LAB — COMPREHENSIVE METABOLIC PANEL
ALT: 8 U/L (ref 0–44)
AST: 9 U/L — ABNORMAL LOW (ref 15–41)
Albumin: 3.3 g/dL — ABNORMAL LOW (ref 3.5–5.0)
Alkaline Phosphatase: 73 U/L (ref 38–126)
Anion gap: 10 (ref 5–15)
BUN: 18 mg/dL (ref 8–23)
CO2: 28 mmol/L (ref 22–32)
Calcium: 8.3 mg/dL — ABNORMAL LOW (ref 8.9–10.3)
Chloride: 95 mmol/L — ABNORMAL LOW (ref 98–111)
Creatinine, Ser: 1.54 mg/dL — ABNORMAL HIGH (ref 0.61–1.24)
GFR calc Af Amer: 48 mL/min — ABNORMAL LOW (ref >60)
GFR calc non Af Amer: 41 mL/min — ABNORMAL LOW (ref >60)
Glucose, Bld: 161 mg/dL — ABNORMAL HIGH (ref 70–99)
Potassium: 3.3 mmol/L — ABNORMAL LOW (ref 3.5–5.1)
Sodium: 133 mmol/L — ABNORMAL LOW (ref 135–145)
Total Bilirubin: 0.8 mg/dL (ref 0.3–1.2)
Total Protein: 6.9 g/dL (ref 6.5–8.1)

## 2019-03-04 LAB — URINALYSIS, COMPLETE (UACMP) WITH MICROSCOPIC
Bacteria, UA: NONE SEEN
Bilirubin Urine: NEGATIVE
Glucose, UA: NEGATIVE mg/dL
Hgb urine dipstick: NEGATIVE
Ketones, ur: 5 mg/dL — AB
Leukocytes,Ua: NEGATIVE
Nitrite: NEGATIVE
Protein, ur: 100 mg/dL — AB
Specific Gravity, Urine: 1.02 (ref 1.005–1.030)
pH: 6 (ref 5.0–8.0)

## 2019-03-04 LAB — TROPONIN I (HIGH SENSITIVITY): Troponin I (High Sensitivity): 21 ng/L — ABNORMAL HIGH (ref <18)

## 2019-03-04 LAB — PROTIME-INR
INR: 1.1 (ref 0.8–1.2)
Prothrombin Time: 14.5 seconds (ref 11.4–15.2)

## 2019-03-04 LAB — GLUCOSE, CAPILLARY
Glucose-Capillary: 111 mg/dL — ABNORMAL HIGH (ref 70–99)
Glucose-Capillary: 108 mg/dL — ABNORMAL HIGH (ref 70–99)

## 2019-03-04 LAB — TSH: TSH: 1.586 u[IU]/mL (ref 0.350–4.500)

## 2019-03-04 LAB — LIPASE, BLOOD: Lipase: 24 U/L (ref 11–51)

## 2019-03-04 MED ORDER — AMLODIPINE BESYLATE 5 MG PO TABS
2.5000 mg | ORAL_TABLET | Freq: Every day | ORAL | Status: DC
  Administered 2019-03-04 – 2019-03-10 (×7): 2.5 mg via ORAL
  Filled 2019-03-04 (×7): qty 1

## 2019-03-04 MED ORDER — DOCUSATE SODIUM 100 MG PO CAPS
100.0000 mg | ORAL_CAPSULE | Freq: Two times a day (BID) | ORAL | Status: DC
  Administered 2019-03-04 – 2019-03-10 (×12): 100 mg via ORAL
  Filled 2019-03-04 (×12): qty 1

## 2019-03-04 MED ORDER — SODIUM CHLORIDE 0.9 % IV SOLN
3.0000 g | Freq: Once | INTRAVENOUS | Status: DC
  Administered 2019-03-04: 3 g via INTRAVENOUS
  Filled 2019-03-04: qty 8

## 2019-03-04 MED ORDER — ALBUTEROL SULFATE (2.5 MG/3ML) 0.083% IN NEBU: 3.0000 mL | INHALATION_SOLUTION | Freq: Four times a day (QID) | RESPIRATORY_TRACT | Status: DC | PRN

## 2019-03-04 MED ORDER — ASCORBIC ACID 500 MG PO TABS
500.0000 mg | ORAL_TABLET | Freq: Every day | ORAL | Status: DC
  Administered 2019-03-04 – 2019-03-09 (×6): 500 mg via ORAL
  Filled 2019-03-04 (×6): qty 1

## 2019-03-04 MED ORDER — TIOTROPIUM BROMIDE MONOHYDRATE 18 MCG IN CAPS
18.0000 ug | ORAL_CAPSULE | Freq: Every day | RESPIRATORY_TRACT | Status: DC
  Administered 2019-03-04 – 2019-03-09 (×6): 18 ug via RESPIRATORY_TRACT
  Filled 2019-03-04 (×2): qty 5

## 2019-03-04 MED ORDER — SODIUM CHLORIDE 0.9% FLUSH
3.0000 mL | INTRAVENOUS | Status: DC | PRN
  Administered 2019-03-04: 3 mL via INTRAVENOUS

## 2019-03-04 MED ORDER — ONDANSETRON HCL 4 MG/2ML IJ SOLN
4.0000 mg | Freq: Once | INTRAMUSCULAR | Status: AC
  Administered 2019-03-04: 4 mg via INTRAVENOUS
  Filled 2019-03-04: qty 2

## 2019-03-04 MED ORDER — PRAVASTATIN SODIUM 10 MG PO TABS
10.0000 mg | ORAL_TABLET | Freq: Every day | ORAL | Status: DC
  Administered 2019-03-04 – 2019-03-09 (×6): 10 mg via ORAL
  Filled 2019-03-04 (×7): qty 1

## 2019-03-04 MED ORDER — SODIUM CHLORIDE 0.9% FLUSH
3.0000 mL | Freq: Two times a day (BID) | INTRAVENOUS | Status: DC
  Administered 2019-03-04 – 2019-03-10 (×12): 3 mL via INTRAVENOUS

## 2019-03-04 MED ORDER — INSULIN ASPART 100 UNIT/ML ~~LOC~~ SOLN
0.0000 [IU] | Freq: Every day | SUBCUTANEOUS | Status: DC
  Administered 2019-03-07: 2 [IU] via SUBCUTANEOUS
  Filled 2019-03-04: qty 1

## 2019-03-04 MED ORDER — ACETAMINOPHEN 650 MG RE SUPP: 650.0000 mg | Freq: Four times a day (QID) | RECTAL | Status: DC | PRN

## 2019-03-04 MED ORDER — ACETAMINOPHEN 325 MG PO TABS
650.0000 mg | ORAL_TABLET | Freq: Four times a day (QID) | ORAL | Status: DC | PRN
  Administered 2019-03-06 – 2019-03-09 (×2): 650 mg via ORAL
  Filled 2019-03-04 (×3): qty 2

## 2019-03-04 MED ORDER — PROMETHAZINE HCL 25 MG PO TABS: 25.0000 mg | ORAL_TABLET | ORAL | Status: DC | PRN

## 2019-03-04 MED ORDER — LACTATED RINGERS IV BOLUS
1000.0000 mL | Freq: Once | INTRAVENOUS | Status: AC
  Administered 2019-03-04: 13:00:00 1000 mL via INTRAVENOUS

## 2019-03-04 MED ORDER — BISACODYL 5 MG PO TBEC: 5.0000 mg | DELAYED_RELEASE_TABLET | Freq: Every day | ORAL | Status: DC | PRN

## 2019-03-04 MED ORDER — TORSEMIDE 20 MG PO TABS
20.0000 mg | ORAL_TABLET | ORAL | Status: DC
  Administered 2019-03-05 – 2019-03-10 (×3): 20 mg via ORAL
  Filled 2019-03-04 (×3): qty 1

## 2019-03-04 MED ORDER — INSULIN ASPART 100 UNIT/ML ~~LOC~~ SOLN
0.0000 [IU] | Freq: Three times a day (TID) | SUBCUTANEOUS | Status: DC
  Administered 2019-03-05 – 2019-03-06 (×3): 2 [IU] via SUBCUTANEOUS
  Administered 2019-03-06: 1 [IU] via SUBCUTANEOUS
  Administered 2019-03-07: 2 [IU] via SUBCUTANEOUS
  Administered 2019-03-07: 3 [IU] via SUBCUTANEOUS
  Administered 2019-03-07: 1 [IU] via SUBCUTANEOUS
  Administered 2019-03-08: 5 [IU] via SUBCUTANEOUS
  Administered 2019-03-08: 2 [IU] via SUBCUTANEOUS
  Administered 2019-03-08: 1 [IU] via SUBCUTANEOUS
  Administered 2019-03-09: 2 [IU] via SUBCUTANEOUS
  Administered 2019-03-09 (×2): 1 [IU] via SUBCUTANEOUS
  Filled 2019-03-04 (×13): qty 1

## 2019-03-04 MED ORDER — TRAZODONE HCL 50 MG PO TABS
25.0000 mg | ORAL_TABLET | Freq: Every evening | ORAL | Status: DC | PRN
  Administered 2019-03-04: 25 mg via ORAL
  Filled 2019-03-04: qty 1

## 2019-03-04 MED ORDER — SODIUM CHLORIDE 0.9 % IV SOLN: 250.0000 mL | INTRAVENOUS | Status: DC | PRN

## 2019-03-04 MED ORDER — POTASSIUM CHLORIDE CRYS ER 10 MEQ PO TBCR
10.0000 meq | EXTENDED_RELEASE_TABLET | Freq: Every evening | ORAL | Status: DC
  Administered 2019-03-04 – 2019-03-09 (×6): 10 meq via ORAL
  Filled 2019-03-04 (×7): qty 1

## 2019-03-04 MED ORDER — DARBEPOETIN ALFA 200 MCG/0.4ML IJ SOSY
150.0000 ug | PREFILLED_SYRINGE | Freq: Once | INTRAMUSCULAR | Status: AC
  Administered 2019-03-04: 150 ug via SUBCUTANEOUS
  Filled 2019-03-04: qty 0.4

## 2019-03-04 MED ORDER — METOCLOPRAMIDE HCL 5 MG PO TABS
5.0000 mg | ORAL_TABLET | Freq: Three times a day (TID) | ORAL | Status: DC
  Administered 2019-03-04 – 2019-03-10 (×23): 5 mg via ORAL
  Filled 2019-03-04 (×23): qty 1

## 2019-03-04 MED ORDER — POLYETHYLENE GLYCOL 3350 17 G PO PACK
17.0000 g | PACK | ORAL | Status: DC
  Administered 2019-03-05 – 2019-03-09 (×3): 17 g via ORAL
  Filled 2019-03-04 (×4): qty 1

## 2019-03-04 MED ORDER — TAMSULOSIN HCL 0.4 MG PO CAPS
0.4000 mg | ORAL_CAPSULE | Freq: Every day | ORAL | Status: DC
  Administered 2019-03-04 – 2019-03-10 (×7): 0.4 mg via ORAL
  Filled 2019-03-04 (×7): qty 1

## 2019-03-04 MED ORDER — ONDANSETRON HCL 4 MG/2ML IJ SOLN: 4.0000 mg | Freq: Four times a day (QID) | INTRAMUSCULAR | Status: DC | PRN

## 2019-03-04 MED ORDER — ONDANSETRON HCL 4 MG PO TABS: 4.0000 mg | ORAL_TABLET | Freq: Four times a day (QID) | ORAL | Status: DC | PRN

## 2019-03-04 MED ORDER — IOHEXOL 300 MG/ML  SOLN
75.0000 mL | Freq: Once | INTRAMUSCULAR | Status: AC | PRN
  Administered 2019-03-04: 75 mL via INTRAVENOUS

## 2019-03-04 NOTE — Consult Note (Addendum)
Hematology/Oncology Consult note [facility] [phone] [phone]  Patient Care Team: Baxter Hire, MD as PCP - General (Internal Medicine) Earlie Server, MD as Consulting Physician (Oncology)   Name of the patient: Arthur [name]  [id number]  12-Feb-1933   Date of visit: 03/04/19 REASON FOR COSULTATION:  Anemia  History of presenting illness-  86 y.o. male with PMH listed at below who presents to ER due to abdominal pain. Patient reports intermittent bilateral lower quadrant pain associated with nausea.  He is a poor historian. Denies any bleeding events, fever, chills, shortness of breath..  When I saw him, he states that abdominal pain has eased up.  CT abdomen pelvis with contrast was done in the emergency room which showed no acute abdominal, pelvis findings, mass lesions or lymphadenopathy.  It was noted that he has persistent right pleural effusion with progressive right lower lobe atelectasis.  Other chronic findings. Patient reports feeling very weak.  He has a longstanding history of chronic kidney disease and anemia secondary to CKD.  He follows up outpatient with me for management of his anemia.  Hematology was consulted for further evaluation of his anemia during this admission. Patient gets erythropoietin treatments outpatient for anemia treatments.  Recently patient had L3 kyphoplasty surgery in December 2021, L2 kyphoplasty in February 2021 Last Retacrit was in January 2021.  February dose was interrupted due to patient's surgery.  Previously discussed about possibility of MDS and the patient declines bone marrow biopsy for further evaluation due to multiple comorbidities.Patient has establish care with  [facility] for multiple comorbidities.   Review of Systems  Unable to perform ROS: Age  Constitutional: Positive for fatigue.  Respiratory: Negative for shortness of breath.   Gastrointestinal: Positive for abdominal  pain and nausea.    Allergies  Allergen Reactions  . Codeine Other (See Comments)    Constipation    Patient Active Problem List   Diagnosis Date Noted  . Pleural effusion 03/04/2019  . S/P kyphoplasty 02/18/2019  . Goals of care, counseling/discussion 01/16/2019  . CKD (chronic kidney disease) stage 4, GFR 15-29 ml/min (HCC) 01/16/2019  . CKD stage 4 due to type 2 diabetes mellitus ([place])   . Closed compression fracture of second lumbar vertebra ([place])   . CKD stage 3 due to type 2 diabetes mellitus ([place])   . Chronic respiratory failure with hypoxia ([place])   . Benign prostatic hyperplasia without lower urinary tract symptoms   . History of anemia due to chronic kidney disease   . Anemia of chronic kidney failure 08/21/2018  . UTI (urinary tract infection) 08/07/2018  . Acute on chronic renal failure ([place]) 08/07/2018  . HLD (hyperlipidemia) 07/24/2018  . Acute on chronic respiratory failure with hypoxia ([place]) 07/06/2018  . Symptomatic anemia 06/27/2018  . GI bleeding 06/09/2018  . Sepsis secondary to UTI ([place]) 05/23/2018  . Cellulitis of right lower extremity 05/22/2018  . Diabetes ([place]) 10/24/2006  . OBESITY 10/24/2006  . Essential hypertension 10/24/2006  . ALLERGIC RHINITIS 10/24/2006  . COPD (chronic obstructive pulmonary disease) ([place]) 10/24/2006  . TOBACCO ABUSE, HX OF 10/24/2006     Past Medical History:  Diagnosis Date  . Anemia of chronic kidney failure 08/21/2018  . Arthritis    lower back  . CHF (congestive heart failure) ([place])   . CKD (chronic kidney disease), stage III   . COPD (chronic obstructive pulmonary disease) ([employer])   . Diabetes mellitus without complication ([place])    type  2  . Dyspnea    when active -wears oxygen 2L via [place]  . History of blood transfusion   . Hyperlipidemia   . Hypertension   . Requires continuous at home supplemental oxygen    2L via [place]     Past Surgical History:  Procedure Laterality Date  . COLONOSCOPY WITH PROPOFOL N/A  06/11/2018   Procedure: COLONOSCOPY WITH PROPOFOL;  Surgeon: Jonathon Bellows, MD;  Location: [facility] ENDOSCOPY;  Service: Gastroenterology;  Laterality: N/A;  . COLONOSCOPY WITH PROPOFOL N/A 06/13/2018   Procedure: COLONOSCOPY WITH PROPOFOL;  Surgeon: Jonathon Bellows, MD;  Location: [facility] ENDOSCOPY;  Service: Gastroenterology;  Laterality: N/A;  . ESOPHAGOGASTRODUODENOSCOPY Left 06/10/2018   Procedure: ESOPHAGOGASTRODUODENOSCOPY (EGD);  Surgeon: Jonathon Bellows, MD;  Location: [facility] ENDOSCOPY;  Service: Gastroenterology;  Laterality: Left;  . EYE SURGERY     removed cataracts  . GIVENS CAPSULE STUDY N/A 06/28/2018   Procedure: GIVENS CAPSULE STUDY;  Surgeon: Lucilla Lame, MD;  Location: [facility] ENDOSCOPY;  Service: Endoscopy;  Laterality: N/A;  . GIVENS CAPSULE STUDY N/A 06/30/2018   Procedure: GIVENS CAPSULE STUDY;  Surgeon: Jonathon Bellows, MD;  Location: [facility] ENDOSCOPY;  Service: Gastroenterology;  Laterality: N/A;  . KYPHOPLASTY N/A 10/31/2018   Procedure: KYPHOPLASTY L2;  Surgeon: [name], MD;  Location: [facility] ORS;  Service: Orthopedics;  Laterality: N/A;  . KYPHOPLASTY N/A 12/24/2018   Procedure: KYPHOPLASTY L3;  Surgeon: [name], MD;  Location: [facility] ORS;  Service: Orthopedics;  Laterality: N/A;  . KYPHOPLASTY N/A 02/18/2019   Procedure: L2 KYPHOPLASTY;  Surgeon: [name], MD;  Location: [facility] ORS;  Service: Orthopedics;  Laterality: N/A;  . RADIOLOGY WITH ANESTHESIA N/A 01/28/2019   Procedure: MRI LUMBER SPINE WITHOUT CONTRAST;  Surgeon: Radiologist, Medication, MD;  Location: [place];  Service: Radiology;  Laterality: N/A;    Social History   Socioeconomic History  . Marital status: Widowed    Spouse name: Not on file  . Number of children: Not on file  . Years of education: Not on file  . Highest education level: Not on file  Occupational History  . Not on file  Tobacco Use  . Smoking status: Former Smoker    Packs/day: 2.00    Years: 35.00    Pack years: 70.00    Types: Cigarettes    Quit  date: 11/04/1982    Years since quitting: 36.3  . Smokeless tobacco: Current User    Types: Chew  Substance and Sexual Activity  . Alcohol use: Not Currently  . Drug use: No  . Sexual activity: Not Currently  Other Topics Concern  . Not on file  Social History Narrative  . Not on file   Social Determinants of Health   Financial Resource Strain: Low Risk   . Difficulty of Paying Living Expenses: Not hard at all  Food Insecurity: No Food Insecurity  . Worried About Charity fundraiser in the Last Year: Never true  . Ran Out of Food in the Last Year: Never true  Transportation Needs: No Transportation Needs  . Lack of Transportation (Medical): No  . Lack of Transportation (Non-Medical): No  Physical Activity: Unknown  . Days of Exercise per Week: 0 days  . Minutes of Exercise per Session: Not on file  Stress: No Stress Concern Present  . Feeling of Stress : Not at all  Social Connections:   . Frequency of Communication with Friends and Family: Not on file  . Frequency of Social Gatherings with Friends and Family: Not on file  .  Attends Religious Services: Not on file  . Active Member of Clubs or Organizations: Not on file  . Attends Archivist Meetings: Not on file  . Marital Status: Not on file  Intimate Partner Violence: Unknown  . Fear of Current or Ex-Partner: Patient refused  . Emotionally Abused: Patient refused  . Physically Abused: Patient refused  . Sexually Abused: Patient refused     Family History  Problem Relation Age of Onset  . COPD Mother   . Cancer Father      Current Facility-Administered Medications:  .  0.9 %  sodium chloride infusion, 250 mL, Intravenous, PRN, Max Sane, MD .  acetaminophen (TYLENOL) tablet 650 mg, 650 mg, Oral, Q6H PRN **OR** acetaminophen (TYLENOL) suppository 650 mg, 650 mg, Rectal, Q6H PRN, Manuella Ghazi, Vipul, MD .  albuterol (PROVENTIL) (2.5 MG/3ML) 0.083% nebulizer solution 3 mL, 3 mL, Inhalation, Q6H PRN, Manuella Ghazi, Vipul,  MD .  amLODipine (NORVASC) tablet 2.5 mg, 2.5 mg, Oral, Daily, Manuella Ghazi, Vipul, MD, 2.5 mg at 03/04/19 1701 .  ascorbic acid (VITAMIN C) tablet 500 mg, 500 mg, Oral, QHS, Shah, Vipul, MD .  bisacodyl (DULCOLAX) EC tablet 5 mg, 5 mg, Oral, Daily PRN, Manuella Ghazi, Vipul, MD .  docusate sodium (COLACE) capsule 100 mg, 100 mg, Oral, BID, Manuella Ghazi, Vipul, MD .  insulin aspart (novoLOG) injection 0-5 Units, 0-5 Units, Subcutaneous, QHS, Shah, Vipul, MD .  insulin aspart (novoLOG) injection 0-9 Units, 0-9 Units, Subcutaneous, TID WC, Shah, Vipul, MD .  metoCLOPramide (REGLAN) tablet 5 mg, 5 mg, Oral, TID AC & HS, Manuella Ghazi, Vipul, MD, 5 mg at 03/04/19 1701 .  ondansetron (ZOFRAN) tablet 4 mg, 4 mg, Oral, Q6H PRN **OR** ondansetron (ZOFRAN) injection 4 mg, 4 mg, Intravenous, Q6H PRN, Max Sane, MD .  Derrill Memo ON 03/05/2019] polyethylene glycol (MIRALAX / GLYCOLAX) packet 17 g, 17 g, Oral, QODAY, Shah, Vipul, MD .  potassium chloride (KLOR-CON) CR tablet 10 mEq, 10 mEq, Oral, QPM, Max Sane, MD, 10 mEq at 03/04/19 1848 .  pravastatin (PRAVACHOL) tablet 10 mg, 10 mg, Oral, QHS, Shah, Vipul, MD .  promethazine (PHENERGAN) tablet 25 mg, 25 mg, Oral, Q4H PRN, Manuella Ghazi, Vipul, MD .  sodium chloride flush (NS) 0.9 % injection 3 mL, 3 mL, Intravenous, Q12H, Manuella Ghazi, Vipul, MD, 3 mL at 03/04/19 1703 .  sodium chloride flush (NS) 0.9 % injection 3 mL, 3 mL, Intravenous, PRN, Max Sane, MD, 3 mL at 03/04/19 1703 .  tamsulosin (FLOMAX) capsule 0.4 mg, 0.4 mg, Oral, Daily, Manuella Ghazi, Vipul, MD, 0.4 mg at 03/04/19 1701 .  tiotropium (SPIRIVA) inhalation capsule ([facility] use ONLY) 18 mcg, 18 mcg, Inhalation, Daily, Max Sane, MD .  Derrill Memo ON 03/05/2019] torsemide (DEMADEX) tablet 20 mg, 20 mg, Oral, Q M,W,F, Manuella Ghazi, Vipul, MD .  traZODone (DESYREL) tablet 25 mg, 25 mg, Oral, QHS PRN, Max Sane, MD   Physical exam: ECOG  Vitals:   03/04/19 1300 03/04/19 1700 03/04/19 1701 03/04/19 1748  BP: (!) 185/70  (!) 157/68 (!) 160/61  Pulse: 74 83  81    Resp: (!) '21 20  16  '$ Temp:    98.1 F (36.7 C)  TempSrc:    Axillary  SpO2: 100% 98%  100%  Weight:      Height:       Physical Exam  Constitutional: No distress.  HENT:  Head: Normocephalic.  Eyes: No scleral icterus.  Cardiovascular: Normal rate.  Pulmonary/Chest: Effort normal.  Breathing via nasal cannula oxygen. Decrease breath sounds bilaterally, R worse  than left.   Abdominal: Soft. He exhibits no distension.  Musculoskeletal:     Cervical back: Neck supple.  Neurological: He is alert.  Skin: Skin is warm and dry.  Psychiatric: Affect normal.        CMP Latest Ref Rng & Units 03/04/2019  Glucose 70 - 99 mg/dL 161(H)  BUN 8 - 23 mg/dL 18  Creatinine 0.61 - 1.24 mg/dL 1.54(H)  Sodium 135 - 145 mmol/L 133(L)  Potassium 3.5 - 5.1 mmol/L 3.3(L)  Chloride 98 - 111 mmol/L 95(L)  CO2 22 - 32 mmol/L 28  Calcium 8.9 - 10.3 mg/dL 8.3(L)  Total Protein 6.5 - 8.1 g/dL 6.9  Total Bilirubin 0.3 - 1.2 mg/dL 0.8  Alkaline Phos 38 - 126 U/L 73  AST 15 - 41 U/L 9(L)  ALT 0 - 44 U/L 8   CBC Latest Ref Rng & Units 03/04/2019  WBC 4.0 - 10.5 K/uL 7.6  Hemoglobin 13.0 - 17.0 g/dL 7.4(L)  Hematocrit 39.0 - 52.0 % 23.3(L)  Platelets 150 - 400 K/uL 366    RADIOGRAPHIC STUDIES: I have personally reviewed the radiological images as listed and agreed with the findings in the report.   DG Chest 2 View  Result Date: 03/04/2019 CLINICAL DATA:  Pleural effusion EXAM: CHEST - 2 VIEW COMPARISON:  10/27/2018 FINDINGS: Moderate right pleural effusion with interval progression. Progressive right lower lobe infiltrate. Pulmonary hyperinflation with COPD. No infiltrate or mass on the left. Heart size normal. Negative for heart failure. Atherosclerotic aortic arch. IMPRESSION: COPD. Progression of right pleural effusion and right lower lobe airspace disease. Possible chronic pneumonia however underlying neoplasm possible. Correlate with prior thoracentesis resolved. Consider follow-up CT chest with  contrast. Electronically Signed   By: Franchot Gallo M.D.   On: 03/04/2019 14:54   DG Lumbar Spine 2-3 Views  Result Date: 02/18/2019 CLINICAL DATA:  Kyphoplasty EXAM: LUMBAR SPINE - 2-3 VIEW; DG C-ARM 1-60 MIN COMPARISON:  12/24/2018, MRI 01/28/2019 FINDINGS: Two lateral and 2 AP spot fluoroscopic images of the lumbar spine demonstrate 2 level kyphoplasty changes. Total fluoroscopy time was 2 minutes. IMPRESSION: Intraoperative fluoroscopic images of the lumbar spine for kyphoplasty. Electronically Signed   By: Donavan Foil M.D.   On: 02/18/2019 16:20   CT Abdomen Pelvis W Contrast  Result Date: 03/04/2019 CLINICAL DATA:  Lower abdominal pain, nausea and vomiting for 1 week. EXAM: CT ABDOMEN AND PELVIS WITH CONTRAST TECHNIQUE: Multidetector CT imaging of the abdomen and pelvis was performed using the standard protocol following bolus administration of intravenous contrast. CONTRAST:  54m OMNIPAQUE IOHEXOL 300 MG/ML  SOLN COMPARISON:  10/27/2018 FINDINGS: Lower chest: Persistent right pleural effusion with progressive right lower lobe atelectasis. Scattered calcifications are noted in the atelectatic lung. These appear new and could be due to chronic inflammation or aspiration. I do not see any obvious pleural lesions possible areas of vague pleural enhancement. Thoracentesis may be helpful for diagnostic purposes. The left lung base is grossly clear. Minimal dependent subpleural atelectasis. The heart is normal in size. No pericardial effusion. Stable coronary artery and aortic calcifications. Borderline enlarged subcarinal node measuring 9 mm. Hepatobiliary: No focal hepatic lesions or intrahepatic biliary dilatation. The gallbladder is mildly dilated and contains a 18 mm rim calcified calculus. No gallbladder wall thickening or pericholecystic inflammatory changes to suggest acute cholecystitis. No common bile duct dilatation. Pancreas: No mass, inflammation or ductal dilatation. Small duodenal  diverticulum noted near the pancreatic head. Spleen: Normal size. No focal lesions. Adrenals/Urinary Tract: Stable low-attenuation nodule  involving the medial limb of the left adrenal gland consistent with a benign adenoma. No worrisome renal lesions or hydronephrosis. No renal or obstructing ureteral calculi. The bladder is unremarkable. No bladder mass or asymmetric bladder wall thickening. Mild prostate gland enlargement with median lobe hypertrophy impressing on the base of the bladder. Stomach/Bowel: The stomach, duodenum, small bowel and colon are grossly normal without oral contrast. No acute inflammatory changes, mass lesions or obstructive findings. The terminal ileum and appendix are normal. Vascular/Lymphatic: Advanced atherosclerotic calcifications involving the aorta, iliac arteries and branch vessels. No focal aneurysm or dissection. The major venous structures are patent. No mesenteric or retroperitoneal mass or adenopathy. Reproductive: Mild prostate gland enlargement with median lobe hypertrophy impressing on the base of the bladder. The seminal vesicles appear normal. Other: Small periumbilical abdominal wall hernia containing fat is stable. No pelvic mass or pelvic adenopathy. No free pelvic fluid collections. Musculoskeletal: Vertebral augmentation changes are noted at L2 and L3, new since prior study. No new spine fractures are identified. The hips are normally located. No worrisome bone lesions. IMPRESSION: 1. No acute abdominal/pelvic findings, mass lesions or lymphadenopathy. 2. Persistent right pleural effusion with progressive right lower lobe atelectasis. There are also calcifications in the atelectatic lung which could be due to chronic inflammation or aspiration. Thoracentesis may be helpful for diagnostic purposes. Vague areas of pleural enhancement are suspected. 3. Cholelithiasis.  No CT findings for acute cholecystitis. 4. Advanced atherosclerotic calcifications involving the aorta  and branch vessels. 5. Stable left adrenal gland adenoma. 6. Vertebral augmentation changes at L2 and L3. Aortic Atherosclerosis (ICD10-I70.0). Electronically Signed   By: Marijo Sanes M.D.   On: 03/04/2019 13:46   DG C-Arm 1-60 Min  Result Date: 02/18/2019 CLINICAL DATA:  Kyphoplasty EXAM: LUMBAR SPINE - 2-3 VIEW; DG C-ARM 1-60 MIN COMPARISON:  12/24/2018, MRI 01/28/2019 FINDINGS: Two lateral and 2 AP spot fluoroscopic images of the lumbar spine demonstrate 2 level kyphoplasty changes. Total fluoroscopy time was 2 minutes. IMPRESSION: Intraoperative fluoroscopic images of the lumbar spine for kyphoplasty. Electronically Signed   By: Donavan Foil M.D.   On: 02/18/2019 16:20    Assessment and plan- Patient is a 86 y.o. male with multiple comorbidities including COPD/chronic respiratory failure, hypertension, hyperlipidemia, chronic anemia, recent kyphoplasty admissions presented to the hospital due to abdominal pain.  Nausea vomiting.  #Abdominal pain: Symptoms have improved spontaneously.  CT abdomen pelvis did not show any acute abdominal/pelvic mass or lymphadenopathy.  Patient has acute on chronic respiratory failure with hypoxia, multifactorial, COPD/emphysema, and possible secondary to the increase of persistent right pleural effusion.  Patient also had 12 mm irregular groundglass opacity in the right upper lobe. Continue oxygen supplementation with gentle diuresis..  IR has been consulted for diagnostic thoracentesis.  Thoracentesis had been done in the past and no malignancy identified.   #Anemia secondary to chronic kidney disease. Previously received palliative erythropoietin replacement therapy outpatient and is overdue for another dose. Hemoglobin 7.4.  Fatigue likely secondary to his anemia. Reticulocyte panel was checked on 02/06/2019 which shows reduced reticulocyte hemoglobin. I will repeat iron panel.   Recommend proceed with palliative Aranesp x 1 today.  I called patient's  daughter and updated her about the plan and she agrees. Patient may need blood transfusion for symptomatic anemia.Will follow up on him tomorrow.   Dr.Shah,thank you for allowing me to participate in the care of this patient.   Earlie Server, MD, PhD Hematology Oncology [facility] Pager- [phone]  03/04/2019 

## 2019-03-04 NOTE — ED Notes (Signed)
Awaiting on pharmacy to verify and send Spiriva med.

## 2019-03-04 NOTE — ED Provider Notes (Signed)
[facility] Emergency Department Provider Note   ____________________________________________   First MD Initiated Contact with Patient 03/04/19 1209     (approximate)  I have reviewed the triage vital signs and the nursing notes.   HISTORY  Chief Complaint Abdominal Pain    HPI Arthur [name] is a 86 y.o. male with past medical history of COPD on 3 L nasal cannula, CHF, CKD, hypertension, hyperlipidemia, diabetes, and anemia who presents to the ED for abdominal pain.  Patient reports that he has been dealing with persistent abdominal pain over the past 3 weeks.  It primarily affects his bilateral lower quadrants and has been associated with nausea as well as 1-2 episodes of vomiting per day.  He currently resides at [facility] and staff there was concerned that he may have a GI bleed given dark stool and emesis, but patient denies noticing any blood in his stool or vomit.  He states that abdominal pain has eased up currently and he denies any fevers, chills, or shortness of breath.  He does endorse occasional pain moving into his lower chest, but does not have this currently.        Past Medical History:  Diagnosis Date  . Anemia of chronic kidney failure 08/21/2018  . Arthritis    lower back  . CHF (congestive heart failure) ([place])   . CKD (chronic kidney disease), stage III   . COPD (chronic obstructive pulmonary disease) ([place])   . Diabetes mellitus without complication ([place])    type 2  . Dyspnea    when active -wears oxygen 2L via [place]  . History of blood transfusion   . Hyperlipidemia   . Hypertension   . Requires continuous at home supplemental oxygen    2L via [place]    Patient Active Problem List   Diagnosis Date Noted  . Pleural effusion 03/04/2019  . S/P kyphoplasty 02/18/2019  . Goals of care, counseling/discussion 01/16/2019  . CKD (chronic kidney disease) stage 4, GFR 15-29 ml/min (HCC) 01/16/2019  . CKD stage 4 due to type 2  diabetes mellitus ([place])   . Closed compression fracture of second lumbar vertebra ([place])   . CKD stage 3 due to type 2 diabetes mellitus ([place])   . Chronic respiratory failure with hypoxia ([place])   . Benign prostatic hyperplasia without lower urinary tract symptoms   . History of anemia due to chronic kidney disease   . Anemia of chronic kidney failure 08/21/2018  . UTI (urinary tract infection) 08/07/2018  . Acute on chronic renal failure ([place]) 08/07/2018  . HLD (hyperlipidemia) 07/24/2018  . Acute on chronic respiratory failure with hypoxia ([place]) 07/06/2018  . Symptomatic anemia 06/27/2018  . GI bleeding 06/09/2018  . Sepsis secondary to UTI ([place]) 05/23/2018  . Cellulitis of right lower extremity 05/22/2018  . Diabetes ([place]) 10/24/2006  . OBESITY 10/24/2006  . Essential hypertension 10/24/2006  . ALLERGIC RHINITIS 10/24/2006  . COPD (chronic obstructive pulmonary disease) ([place]) 10/24/2006  . TOBACCO ABUSE, HX OF 10/24/2006    Past Surgical History:  Procedure Laterality Date  . COLONOSCOPY WITH PROPOFOL N/A 06/11/2018   Procedure: COLONOSCOPY WITH PROPOFOL;  Surgeon: Jonathon Bellows, MD;  Location: [facility] ENDOSCOPY;  Service: Gastroenterology;  Laterality: N/A;  . COLONOSCOPY WITH PROPOFOL N/A 06/13/2018   Procedure: COLONOSCOPY WITH PROPOFOL;  Surgeon: Jonathon Bellows, MD;  Location: [facility] ENDOSCOPY;  Service: Gastroenterology;  Laterality: N/A;  . ESOPHAGOGASTRODUODENOSCOPY Left 06/10/2018   Procedure: ESOPHAGOGASTRODUODENOSCOPY (EGD);  Surgeon: Jonathon Bellows, MD;  Location: [facility] ENDOSCOPY;  Service: Gastroenterology;  Laterality: Left;  . EYE SURGERY     removed cataracts  . GIVENS CAPSULE STUDY N/A 06/28/2018   Procedure: GIVENS CAPSULE STUDY;  Surgeon: Lucilla Lame, MD;  Location: [facility] ENDOSCOPY;  Service: Endoscopy;  Laterality: N/A;  . GIVENS CAPSULE STUDY N/A 06/30/2018   Procedure: GIVENS CAPSULE STUDY;  Surgeon: Jonathon Bellows, MD;  Location: [facility] ENDOSCOPY;  Service: Gastroenterology;   Laterality: N/A;  . KYPHOPLASTY N/A 10/31/2018   Procedure: KYPHOPLASTY L2;  Surgeon: [name], MD;  Location: [facility] ORS;  Service: Orthopedics;  Laterality: N/A;  . KYPHOPLASTY N/A 12/24/2018   Procedure: KYPHOPLASTY L3;  Surgeon: [name], MD;  Location: [facility] ORS;  Service: Orthopedics;  Laterality: N/A;  . KYPHOPLASTY N/A 02/18/2019   Procedure: L2 KYPHOPLASTY;  Surgeon: [name], MD;  Location: [facility] ORS;  Service: Orthopedics;  Laterality: N/A;  . RADIOLOGY WITH ANESTHESIA N/A 01/28/2019   Procedure: MRI LUMBER SPINE WITHOUT CONTRAST;  Surgeon: Radiologist, Medication, MD;  Location: [facility];  Service: Radiology;  Laterality: N/A;    Prior to Admission medications   Medication Sig Start Date End Date Taking? Authorizing Provider  albuterol (VENTOLIN HFA) 108 (90 Base) MCG/ACT inhaler Inhale 2 puffs into the lungs every 6 (six) hours as needed for wheezing or shortness of breath. 05/27/18  Yes Demetrios Loll, MD  amLODipine (NORVASC) 2.5 MG tablet Take 2.5 mg by mouth daily.   Yes [provider]  ascorbic acid (VITAMIN C) 500 MG tablet Take 500 mg by mouth at bedtime.   Yes [provider]  docusate sodium (COLACE) 100 MG capsule Take 1 capsule (100 mg total) by mouth 2 (two) times daily. 11/05/18  Yes Wieting, Richard, MD  HYDROcodone-acetaminophen (NORCO) 5-325 MG tablet Take 1 tablet by mouth every 6 (six) hours as needed. Patient taking differently: Take 1 tablet by mouth every 6 (six) hours as needed for moderate pain or severe pain.  02/19/19  Yes Duanne Guess, PA-C  insulin aspart (NOVOLOG) 100 UNIT/ML injection Inject 5 Units into the skin 3 (three) times daily with meals. 11/05/18  Yes Wieting, Richard, MD  LEVEMIR 100 UNIT/ML injection Inject 0.12 mLs (12 Units total) into the skin daily. Patient taking differently: Inject 12 Units into the skin at bedtime.  07/10/18  Yes Fritzi Mandes, MD  metoCLOPramide (REGLAN) 5 MG tablet Take 5 mg by mouth 4 (four) times  daily -  before meals and at bedtime.   Yes [provider]  pantoprazole (PROTONIX) 40 MG tablet Take 1 tablet (40 mg total) by mouth 2 (two) times daily. Patient taking differently: Take 40 mg by mouth daily.  06/14/18  Yes Lang Snow, NP  polyethylene glycol powder (GLYCOLAX/MIRALAX) 17 GM/SCOOP powder Take 17 g by mouth every other day.  01/08/19  Yes [provider]  potassium chloride (KLOR-CON) 10 MEQ tablet Take 10 mEq by mouth every evening.   Yes [provider]  pravastatin (PRAVACHOL) 10 MG tablet Take 10 mg by mouth at bedtime. 04/25/18  Yes [provider]  promethazine (PHENERGAN) 25 MG tablet Take 25 mg by mouth every 4 (four) hours as needed for nausea or vomiting.   Yes [provider]  tamsulosin (FLOMAX) 0.4 MG CAPS capsule Take 0.4 mg by mouth daily. 04/25/18  Yes [provider]  tiotropium (SPIRIVA) 18 MCG inhalation capsule Place 18 mcg into inhaler and inhale daily.   Yes [provider]  torsemide (DEMADEX) 20 MG tablet Take 20  mg by mouth every Monday, Wednesday, and Friday.   Yes [provider]    Allergies Codeine  Family History  Problem Relation Age of Onset  . COPD Mother   . Cancer Father     Social History Social History   Tobacco Use  . Smoking status: Former Smoker    Packs/day: 2.00    Years: 35.00    Pack years: 70.00    Types: Cigarettes    Quit date: 11/04/1982    Years since quitting: 36.3  . Smokeless tobacco: Current User    Types: Chew  Substance Use Topics  . Alcohol use: Not Currently  . Drug use: No    Review of Systems  Constitutional: No fever/chills Eyes: No visual changes. ENT: No sore throat. Cardiovascular: Positive for chest pain. Respiratory: Denies shortness of breath. Gastrointestinal: Positive for abdominal pain.  Positive for nausea and vomiting.  No diarrhea.  No constipation. Genitourinary: Negative for dysuria. Musculoskeletal:  Negative for back pain. Skin: Negative for rash. Neurological: Negative for headaches, focal weakness or numbness.  ____________________________________________   PHYSICAL EXAM:  VITAL SIGNS: ED Triage Vitals  Enc Vitals Group     BP 03/04/19 1209 (!) 179/72     Pulse Rate 03/04/19 1209 83     Resp 03/04/19 1209 19     Temp 03/04/19 1209 98.7 F (37.1 C)     Temp Source 03/04/19 1209 Oral     SpO2 03/04/19 1208 100 %     Weight --      Height --      Head Circumference --      Peak Flow --      Pain Score 03/04/19 1210 6     Pain Loc --      Pain Edu? --      Excl. in [place]? --     Constitutional: Alert and oriented. Eyes: Conjunctivae are normal. Head: Atraumatic. Nose: No congestion/rhinnorhea. Mouth/Throat: Mucous membranes are moist. Neck: Normal ROM Cardiovascular: Normal rate, regular rhythm. Grossly normal heart sounds. Respiratory: Normal respiratory effort.  No retractions. Lungs CTAB. Gastrointestinal: Soft and tender to palpation in bilateral lower quadrants with no rebound or guarding. No distention.  Dark brown guaiac negative stool on exam. Genitourinary: deferred Musculoskeletal: No lower extremity tenderness nor edema. Neurologic:  Normal speech and language. No gross focal neurologic deficits are appreciated. Skin:  Skin is warm, dry and intact. No rash noted. Psychiatric: Mood and affect are normal. Speech and behavior are normal.  ____________________________________________   LABS (all labs ordered are listed, but only abnormal results are displayed)  Labs Reviewed  CBC WITH DIFFERENTIAL/PLATELET - Abnormal; Notable for the following components:      Result Value   RBC 2.55 (*)    Hemoglobin 7.4 (*)    HCT 23.3 (*)    RDW 17.6 (*)    All other components within normal limits  COMPREHENSIVE METABOLIC PANEL - Abnormal; Notable for the following components:   Sodium 133 (*)    Potassium 3.3 (*)    Chloride 95 (*)    Glucose, Bld 161 (*)     Creatinine, Ser 1.54 (*)    Calcium 8.3 (*)    Albumin 3.3 (*)    AST 9 (*)    GFR calc non Af Amer 41 (*)    GFR calc Af Amer 48 (*)    All other components within normal limits  URINALYSIS, COMPLETE (UACMP) WITH MICROSCOPIC - Abnormal; Notable for the following components:  Color, Urine YELLOW (*)    APPearance CLEAR (*)    Ketones, ur 5 (*)    Protein, ur 100 (*)    All other components within normal limits  TROPONIN I (HIGH SENSITIVITY) - Abnormal; Notable for the following components:   Troponin I (High Sensitivity) 21 (*)    All other components within normal limits  SARS CORONAVIRUS 2 (TAT 6-24 HRS)  LIPASE, BLOOD  TSH  PROTIME-INR  OCCULT BLOOD X 1 CARD TO LAB, STOOL   ____________________________________________  EKG  ED ECG REPORT I, Blake Divine, the attending physician, personally viewed and interpreted this ECG.   Date: 03/04/2019  EKG Time: 12:22  Rate: 79  Rhythm: normal sinus rhythm  Axis: Normal  Intervals:right bundle branch block  ST&T Change: None   PROCEDURES  Procedure(s) performed (including Critical Care):  Procedures   ____________________________________________   INITIAL IMPRESSION / ASSESSMENT AND PLAN / ED COURSE       86 year old male with history of COPD on 3 L nasal cannula chronically, CHF, CKD, hypertension, hyperlipidemia, diabetes, and anemia presents to the ED with 3 weeks of persistent lower abdominal pain as well as nausea and occasional vomiting.  He is comfortable here in the ED and states that pain has eased up, however he has some tenderness to his bilateral lower quadrants.  We will further assess with CT scan and labs, treat nausea with IV Zofran but patient declines pain medication at this time.  Lower suspicion for cardiac etiology, although patient does endorse pain moving up into his lower chest, will screen EKG and troponin.  EKG and troponin are unremarkable.  CT scan is negative for acute abdominal process,  but does show enlarging right-sided pleural effusion with associated infiltrate, concerning for aspiration pneumonia.  Patient does endorse worsening shortness of breath over the past few days and he has also had persistent vomiting.  We will treat with initial dose of Unasyn in case discussed with hospitalist, who accepts patient for admission.      ____________________________________________   FINAL CLINICAL IMPRESSION(S) / ED DIAGNOSES  Final diagnoses:  Pleural effusion  Aspiration pneumonia of right lower lobe, unspecified aspiration pneumonia type ([place])  Shortness of breath     ED Discharge Orders    None       Note:  This document was prepared using Dragon voice recognition software and may include unintentional dictation errors.   Blake Divine, MD 03/04/19 [phone]

## 2019-03-04 NOTE — ED Triage Notes (Signed)
Pt from [facility]. Per EMS, staff reported pt c/o lower abd pain, N/V, dark stools and dark emesis for 1 week. Per pt he is not on blood thinners. Pt on  3L [place] chronically. NAd noted upon arrival. EDP [place] at bedside.

## 2019-03-04 NOTE — H&P (Addendum)
[facility] [place] [facility] NAME: Arthur [name]    MR#:  [id number]  DATE OF BIRTH:  Jul 08, 1933  DATE OF ADMISSION:  03/04/2019  PRIMARY CARE PHYSICIAN: Baxter Hire, MD   REQUESTING/REFERRING PHYSICIAN: Blake Divine, MD  Patient came from [place]:   Chief Complaint  Patient presents with  . Abdominal Pain   HISTORY OF PRESENT ILLNESS:  Arthur [name]  is a 86 y.o. male with a known history of COPD on 3 L nasal cannula, CHF, CKD, hypertension, hyperlipidemia, diabetes, and anemia is being admitted for worsening pleural effusion.  Patient had a L2 KYPHOPLASTY on 02/18/2019 after which he was discharged on February 18 to [facility]. Patient reports that he has been dealing with persistent abdominal pain over the past 3 weeks.  It primarily affects his bilateral lower quadrants and has been associated with nausea as well as 1-2 episodes of vomiting per day.  He currently resides at [facility] and staff there was concerned that he may have a GI bleed given dark stool and emesis, but patient denies noticing any blood in his stool or vomit.  He states that abdominal pain has eased up currently and has no nausea or vomiting.    In the ED he underwent CT scan of the abdomen and pelvis which shows worsening right-sided pleural effusion.  No acute abdominal pathology.  He is being admitted for further evaluation and management. PAST MEDICAL HISTORY:   Past Medical History:  Diagnosis Date  . Anemia of chronic kidney failure 08/21/2018  . Arthritis    lower back  . CHF (congestive heart failure) ([place])   . CKD (chronic kidney disease), stage III   . COPD (chronic obstructive pulmonary disease) ([place])   . Diabetes mellitus without complication ([place])    type 2  . Dyspnea    when active -wears oxygen 2L via [place]  . History of blood transfusion   . Hyperlipidemia   . Hypertension   . Requires continuous at home supplemental oxygen    2L via  [place]   PAST SURGICAL HISTORY:   Past Surgical History:  Procedure Laterality Date  . COLONOSCOPY WITH PROPOFOL N/A 06/11/2018   Procedure: COLONOSCOPY WITH PROPOFOL;  Surgeon: Jonathon Bellows, MD;  Location: [facility] ENDOSCOPY;  Service: Gastroenterology;  Laterality: N/A;  . COLONOSCOPY WITH PROPOFOL N/A 06/13/2018   Procedure: COLONOSCOPY WITH PROPOFOL;  Surgeon: Jonathon Bellows, MD;  Location: [facility] ENDOSCOPY;  Service: Gastroenterology;  Laterality: N/A;  . ESOPHAGOGASTRODUODENOSCOPY Left 06/10/2018   Procedure: ESOPHAGOGASTRODUODENOSCOPY (EGD);  Surgeon: Jonathon Bellows, MD;  Location[facility] END[facility]COPY;  Service: Gastroenterology;  Laterality: Left;  . EYE SURGERY     removed cataracts  . GIVENS CAPSULE STUDY N/A 06/28/2018   Procedure: GIVENS CAPSULE STUDY;  Surgeon: Lucilla Lame, MD;  Location: [facility] ENDOSCOPY;  Service: Endoscopy;  Laterality: N/A;  . GIVENS CAPSULE STUDY N/A 06/30/2018   Procedure: GIVENS CAPSULE STUDY;  Surgeon: Jonathon Bellows, MD;  Location: [facility] ENDOSCOPY;  Service: Gastroenterology;  Laterality: N/A;  . KYPHOPLASTY N/A 10/31/2018   Procedure: KYPHOPLASTY L2;  Surgeon: [name], MD;  Location: [facility] ORS;  Service: Orthopedics;  Laterality: N/A;  . KYPHOPLASTY N/A 12/24/2018   Procedure: KYPHOPLASTY L3;  Surgeon: [name], MD;  Location: [facility] ORS;  Service: Orthopedics;  Laterality: N/A;  . KYPHOPLASTY N/A 02/18/2019   Procedure: L2 KYPHOPLASTY;  Surgeon: [name], MD;  Location: [facility] ORS;  Service: Orthopedics;  Laterality: N/A;  . RADIOLOGY WITH ANESTHESIA  N/A 01/28/2019   Procedure: MRI LUMBER SPINE WITHOUT CONTRAST;  Surgeon: Radiologist, Medication, MD;  Location: [facility];  Service: Radiology;  Laterality: N/A;   SOCIAL HISTORY:   Social History   Tobacco Use  . Smoking status: Former Smoker    Packs/day: 2.00    Years: 35.00    Pack years: 70.00    Types: Cigarettes    Quit date: 11/04/1982    Years since quitting: 36.3  . Smokeless tobacco: Current User    Types:  Chew  Substance Use Topics  . Alcohol use: Not Currently   FAMILY HISTORY:   Family History  Problem Relation Age of Onset  . COPD Mother   . Cancer Father    DRUG ALLERGIES:   Allergies  Allergen Reactions  . Codeine Other (See Comments)    Constipation   REVIEW OF SYSTEMS:  Review of Systems  Constitutional: Positive for malaise/fatigue. Negative for diaphoresis, fever and weight loss.  HENT: Negative for ear discharge, ear pain, hearing loss, nosebleeds, sore throat and tinnitus.   Eyes: Negative for blurred vision and pain.  Respiratory: Positive for shortness of breath. Negative for cough, hemoptysis and wheezing.   Cardiovascular: Negative for chest pain, palpitations, orthopnea and leg swelling.  Gastrointestinal: Positive for abdominal pain, nausea and vomiting. Negative for blood in stool, constipation, diarrhea and heartburn.  Genitourinary: Negative for dysuria, frequency and urgency.  Musculoskeletal: Negative for back pain and myalgias.  Skin: Negative for itching and rash.  Neurological: Negative for dizziness, tingling, tremors, focal weakness, seizures, weakness and headaches.  Psychiatric/Behavioral: Negative for depression. The patient is not nervous/anxious.    MEDICATIONS AT HOME:   Prior to Admission medications   Medication Sig Start Date End Date Taking? Authorizing Provider  albuterol (VENTOLIN HFA) 108 (90 Base) MCG/ACT inhaler Inhale 2 puffs into the lungs every 6 (six) hours as needed for wheezing or shortness of breath. 05/27/18  Yes Demetrios Loll, MD  amLODipine (NORVASC) 2.5 MG tablet Take 2.5 mg by mouth daily.   Yes [provider]  ascorbic acid (VITAMIN C) 500 MG tablet Take 500 mg by mouth at bedtime.   Yes [provider]  docusate sodium (COLACE) 100 MG capsule Take 1 capsule (100 mg total) by mouth 2 (two) times daily. 11/05/18  Yes Wieting, Richard, MD  HYDROcodone-acetaminophen (NORCO) 5-325 MG tablet Take 1 tablet by mouth  every 6 (six) hours as needed. Patient taking differently: Take 1 tablet by mouth every 6 (six) hours as needed for moderate pain or severe pain.  02/19/19  Yes Duanne Guess, PA-C  insulin aspart (NOVOLOG) 100 UNIT/ML injection Inject 5 Units into the skin 3 (three) times daily with meals. 11/05/18  Yes Wieting, Richard, MD  LEVEMIR 100 UNIT/ML injection Inject 0.12 mLs (12 Units total) into the skin daily. Patient taking differently: Inject 12 Units into the skin at bedtime.  07/10/18  Yes Fritzi Mandes, MD  metoCLOPramide (REGLAN) 5 MG tablet Take 5 mg by mouth 4 (four) times daily -  before meals and at bedtime.   Yes [provider]  pantoprazole (PROTONIX) 40 MG tablet Take 1 tablet (40 mg total) by mouth 2 (two) times daily. Patient taking differently: Take 40 mg by mouth daily.  06/14/18  Yes Lang Snow, NP  polyethylene glycol powder (GLYCOLAX/MIRALAX) 17 GM/SCOOP powder Take 17 g by mouth every other day.  01/08/19  Yes [provider]  potassium chloride (KLOR-CON) 10 MEQ tablet Take 10 mEq by mouth every  evening.   Yes [provider]  pravastatin (PRAVACHOL) 10 MG tablet Take 10 mg by mouth at bedtime. 04/25/18  Yes [provider]  promethazine (PHENERGAN) 25 MG tablet Take 25 mg by mouth every 4 (four) hours as needed for nausea or vomiting.   Yes [provider]  tamsulosin (FLOMAX) 0.4 MG CAPS capsule Take 0.4 mg by mouth daily. 04/25/18  Yes [provider]  tiotropium (SPIRIVA) 18 MCG inhalation capsule Place 18 mcg into inhaler and inhale daily.   Yes [provider]  torsemide (DEMADEX) 20 MG tablet Take 20 mg by mouth every Monday, Wednesday, and Friday.   Yes [provider]    VITAL SIGNS:  Blood pressure (!) 185/70, pulse 74, temperature 98.7 F (37.1 C), temperature source Oral, resp. rate (!) 21, height 5\' 8"  (1.727 m), weight 93 kg, SpO2 100 %. PHYSICAL EXAMINATION:  Physical Exam  GENERAL:   86 y.o.-year-old patient lying in the bed with no acute distress.  EYES: Pupils equal, round, reactive to light and accommodation. No scleral icterus. Extraocular muscles intact.  HEENT: Head atraumatic, normocephalic. Oropharynx and nasopharynx clear.  NECK:  Supple, no jugular venous distention. No thyroid enlargement, no tenderness.  LUNGS: Normal breath sounds bilaterally, no wheezing, rales,rhonchi or crepitation. No use of accessory muscles of respiration.  CARDIOVASCULAR: S1, S2 normal. No murmurs, rubs, or gallops.  ABDOMEN: Soft, nontender, nondistended. Bowel sounds present. No organomegaly or mass.  EXTREMITIES: No pedal edema, cyanosis, or clubbing.  NEUROLOGIC: Cranial nerves II through XII are intact. Muscle strength 5/5 in all extremities. Sensation intact. Gait not checked.  PSYCHIATRIC: The patient is alert and oriented x 3.  SKIN: No obvious rash, lesion, or ulcer.  LABORATORY PANEL:   CBC Recent Labs  Lab 03/04/19 1220  WBC 7.6  HGB 7.4*  HCT 23.3*  PLT 366   ------------------------------------------------------------------------------------------------------------------  Chemistries  Recent Labs  Lab 03/04/19 1220  NA 133*  K 3.3*  CL 95*  CO2 28  GLUCOSE 161*  BUN 18  CREATININE 1.54*  CALCIUM 8.3*  AST 9*  ALT 8  ALKPHOS 73  BILITOT 0.8   ------------------------------------------------------------------------------------------------------------------  Cardiac Enzymes No results for input(s): TROPONINI in the last 168 hours. ------------------------------------------------------------------------------------------------------------------  RADIOLOGY:  DG Chest 2 View  Result Date: 03/04/2019 CLINICAL DATA:  Pleural effusion EXAM: CHEST - 2 VIEW COMPARISON:  10/27/2018 FINDINGS: Moderate right pleural effusion with interval progression. Progressive right lower lobe infiltrate. Pulmonary hyperinflation with COPD. No infiltrate or mass on the left.  Heart size normal. Negative for heart failure. Atherosclerotic aortic arch. IMPRESSION: COPD. Progression of right pleural effusion and right lower lobe airspace disease. Possible chronic pneumonia however underlying neoplasm possible. Correlate with prior thoracentesis resolved. Consider follow-up CT chest with contrast. Electronically Signed   By: Franchot Gallo M.D.   On: 03/04/2019 14:54   CT Abdomen Pelvis W Contrast  Result Date: 03/04/2019 CLINICAL DATA:  Lower abdominal pain, nausea and vomiting for 1 week. EXAM: CT ABDOMEN AND PELVIS WITH CONTRAST TECHNIQUE: Multidetector CT imaging of the abdomen and pelvis was performed using the standard protocol following bolus administration of intravenous contrast. CONTRAST:  53mL OMNIPAQUE IOHEXOL 300 MG/ML  SOLN COMPARISON:  10/27/2018 FINDINGS: Lower chest: Persistent right pleural effusion with progressive right lower lobe atelectasis. Scattered calcifications are noted in the atelectatic lung. These appear new and could be due to chronic inflammation or aspiration. I do not see any obvious pleural lesions possible areas of vague pleural enhancement. Thoracentesis may be  helpful for diagnostic purposes. The left lung base is grossly clear. Minimal dependent subpleural atelectasis. The heart is normal in size. No pericardial effusion. Stable coronary artery and aortic calcifications. Borderline enlarged subcarinal node measuring 9 mm. Hepatobiliary: No focal hepatic lesions or intrahepatic biliary dilatation. The gallbladder is mildly dilated and contains a 18 mm rim calcified calculus. No gallbladder wall thickening or pericholecystic inflammatory changes to suggest acute cholecystitis. No common bile duct dilatation. Pancreas: No mass, inflammation or ductal dilatation. Small duodenal diverticulum noted near the pancreatic head. Spleen: Normal size. No focal lesions. Adrenals/Urinary Tract: Stable low-attenuation nodule involving the medial limb of the left  adrenal gland consistent with a benign adenoma. No worrisome renal lesions or hydronephrosis. No renal or obstructing ureteral calculi. The bladder is unremarkable. No bladder mass or asymmetric bladder wall thickening. Mild prostate gland enlargement with median lobe hypertrophy impressing on the base of the bladder. Stomach/Bowel: The stomach, duodenum, small bowel and colon are grossly normal without oral contrast. No acute inflammatory changes, mass lesions or obstructive findings. The terminal ileum and appendix are normal. Vascular/Lymphatic: Advanced atherosclerotic calcifications involving the aorta, iliac arteries and branch vessels. No focal aneurysm or dissection. The major venous structures are patent. No mesenteric or retroperitoneal mass or adenopathy. Reproductive: Mild prostate gland enlargement with median lobe hypertrophy impressing on the base of the bladder. The seminal vesicles appear normal. Other: Small periumbilical abdominal wall hernia containing fat is stable. No pelvic mass or pelvic adenopathy. No free pelvic fluid collections. Musculoskeletal: Vertebral augmentation changes are noted at L2 and L3, new since prior study. No new spine fractures are identified. The hips are normally located. No worrisome bone lesions. IMPRESSION: 1. No acute abdominal/pelvic findings, mass lesions or lymphadenopathy. 2. Persistent right pleural effusion with progressive right lower lobe atelectasis. There are also calcifications in the atelectatic lung which could be due to chronic inflammation or aspiration. Thoracentesis may be helpful for diagnostic purposes. Vague areas of pleural enhancement are suspected. 3. Cholelithiasis.  No CT findings for acute cholecystitis. 4. Advanced atherosclerotic calcifications involving the aorta and branch vessels. 5. Stable left adrenal gland adenoma. 6. Vertebral augmentation changes at L2 and L3. Aortic Atherosclerosis (ICD10-I70.0). Electronically Signed   By: Marijo Sanes M.D.   On: 03/04/2019 13:46   IMPRESSION AND PLAN:  86 y.o. male with a known history of COPD, diabetes, hypertension, hyperlipidemia, chronic anemia, recent admission for kyphoplasty and also chronic lymphedema on the lower extremities who returns back to the hospital today due to shortness of breath/hypoxia.  1.  Acute on chronic respiratory failure with hypoxia-secondary to worsening right-sided pleural effusion -He is requiring about 3 L oxygen at this time.  I have ordered IR-guided thoracentesis for tomorrow - Continue O2 supplementation, will gently diurese him  -Previous echocardiogram showed EF of 55 to 60%.  2. Diabetes type 2 with CKD stage III Sliding scale insulin for now patient's creatinine close to baseline we will continue to follow with diuresis.  3. Anemia of chronic kidney disease Hemoglobin at 7.4 followed by [name] at [facility] and transfuse if hemoglobin drops less than 7.  No obvious GI bleed  4. Hypertension - continue amlodipine.  5.  GERD-continue Protonix.  6.  COPD-no acute exacerbation, continue home inhalers.  7.  BPH-continue Flomax.  8. Hyperlipidemia-continue Pravachol.  9. Hypokalemia - replete and recheck   Consult palliative care.  Patient was scheduled to see palliative care at [facility].  He was also under hospice  care sometimes in 2020.   All the records are reviewed and case discussed with ED provider. Management plans discussed with the patient, nursing and they are in agreement.  CODE STATUS: DNR  TOTAL TIME TAKING CARE OF THIS PATIENT: 45 minutes.    Max Sane M.D on 03/04/2019 at 3:51 PM  [facility]   CC: Primary care physician; Baxter Hire, MD   Note: This dictation was prepared with Dragon dictation along with smaller phrase technology. Any transcriptional errors that result from this process are unintentional.

## 2019-03-04 NOTE — ED Notes (Signed)
Transport request placed by Network engineer, [name].

## 2019-03-05 ENCOUNTER — Inpatient Hospital Stay: Payer: Medicare HMO

## 2019-03-05 DIAGNOSIS — N183 Chronic kidney disease, stage 3 unspecified: Secondary | ICD-10-CM

## 2019-03-05 DIAGNOSIS — Z66 Do not resuscitate: Secondary | ICD-10-CM

## 2019-03-05 DIAGNOSIS — N1832 Chronic kidney disease, stage 3b: Secondary | ICD-10-CM

## 2019-03-05 DIAGNOSIS — E1122 Type 2 diabetes mellitus with diabetic chronic kidney disease: Secondary | ICD-10-CM

## 2019-03-05 DIAGNOSIS — R1084 Generalized abdominal pain: Secondary | ICD-10-CM

## 2019-03-05 DIAGNOSIS — D649 Anemia, unspecified: Secondary | ICD-10-CM

## 2019-03-05 DIAGNOSIS — Z9889 Other specified postprocedural states: Secondary | ICD-10-CM

## 2019-03-05 DIAGNOSIS — K219 Gastro-esophageal reflux disease without esophagitis: Secondary | ICD-10-CM

## 2019-03-05 LAB — PREPARE RBC (CROSSMATCH)

## 2019-03-05 LAB — HEMOGLOBIN AND HEMATOCRIT, BLOOD
HCT: 22.3 % — ABNORMAL LOW (ref 39.0–52.0)
Hemoglobin: 7.1 g/dL — ABNORMAL LOW (ref 13.0–17.0)

## 2019-03-05 LAB — CBC
HCT: 18.3 % — ABNORMAL LOW (ref 39.0–52.0)
Hemoglobin: 5.9 g/dL — ABNORMAL LOW (ref 13.0–17.0)
MCH: 29.5 pg (ref 26.0–34.0)
MCHC: 32.2 g/dL (ref 30.0–36.0)
MCV: 91.5 fL (ref 80.0–100.0)
Platelets: 274 10*3/uL (ref 150–400)
RBC: 2 MIL/uL — ABNORMAL LOW (ref 4.22–5.81)
RDW: 17.9 % — ABNORMAL HIGH (ref 11.5–15.5)
WBC: 6.5 10*3/uL (ref 4.0–10.5)
nRBC: 0 % (ref 0.0–0.2)

## 2019-03-05 LAB — GLUCOSE, CAPILLARY
Glucose-Capillary: 98 mg/dL (ref 70–99)
Glucose-Capillary: 175 mg/dL — ABNORMAL HIGH (ref 70–99)
Glucose-Capillary: 156 mg/dL — ABNORMAL HIGH (ref 70–99)
Glucose-Capillary: 149 mg/dL — ABNORMAL HIGH (ref 70–99)

## 2019-03-05 LAB — FERRITIN: Ferritin: 464 ng/mL — ABNORMAL HIGH (ref 24–336)

## 2019-03-05 LAB — LACTATE DEHYDROGENASE, PLEURAL OR PERITONEAL FLUID: LD, Fluid: 179 U/L — ABNORMAL HIGH (ref 3–23)

## 2019-03-05 LAB — BASIC METABOLIC PANEL
Anion gap: 12 (ref 5–15)
BUN: 18 mg/dL (ref 8–23)
CO2: 27 mmol/L (ref 22–32)
Calcium: 8.2 mg/dL — ABNORMAL LOW (ref 8.9–10.3)
Chloride: 94 mmol/L — ABNORMAL LOW (ref 98–111)
Creatinine, Ser: 1.57 mg/dL — ABNORMAL HIGH (ref 0.61–1.24)
GFR calc Af Amer: 47 mL/min — ABNORMAL LOW (ref >60)
GFR calc non Af Amer: 40 mL/min — ABNORMAL LOW (ref >60)
Glucose, Bld: 104 mg/dL — ABNORMAL HIGH (ref 70–99)
Potassium: 3.5 mmol/L (ref 3.5–5.1)
Sodium: 133 mmol/L — ABNORMAL LOW (ref 135–145)

## 2019-03-05 LAB — BODY FLUID CELL COUNT WITH DIFFERENTIAL
Eos, Fluid: 0 %
Lymphs, Fluid: 99 %
Monocyte-Macrophage-Serous Fluid: 1 %
Neutrophil Count, Fluid: 0 %
Other Cells, Fluid: 0 %
Total Nucleated Cell Count, Fluid: 69 cu mm

## 2019-03-05 LAB — SARS CORONAVIRUS 2 (TAT 6-24 HRS): SARS Coronavirus 2: NEGATIVE

## 2019-03-05 LAB — IRON AND TIBC
Iron: 78 ug/dL (ref 45–182)
Saturation Ratios: 55 % — ABNORMAL HIGH (ref 17.9–39.5)
TIBC: 141 ug/dL — ABNORMAL LOW (ref 250–450)
UIBC: 63 ug/dL

## 2019-03-05 LAB — PROTEIN, PLEURAL OR PERITONEAL FLUID: Total protein, fluid: <3 g/dL

## 2019-03-05 LAB — ALBUMIN, PLEURAL OR PERITONEAL FLUID: Albumin, Fluid: 1.6 g/dL

## 2019-03-05 LAB — AMYLASE, PLEURAL OR PERITONEAL FLUID: Amylase, Fluid: 30 U/L

## 2019-03-05 LAB — LACTATE DEHYDROGENASE: LDH: 117 U/L (ref 98–192)

## 2019-03-05 LAB — GLUCOSE, PLEURAL OR PERITONEAL FLUID: Glucose, Fluid: 87 mg/dL

## 2019-03-05 MED ORDER — SODIUM CHLORIDE 0.9% IV SOLUTION: Freq: Once | INTRAVENOUS | Status: AC

## 2019-03-05 MED ORDER — LACTULOSE 10 GM/15ML PO SOLN
30.0000 g | Freq: Once | ORAL | Status: AC
  Administered 2019-03-05: 30 g via ORAL
  Filled 2019-03-05: qty 60

## 2019-03-05 MED ORDER — ACETAMINOPHEN 325 MG PO TABS
650.0000 mg | ORAL_TABLET | Freq: Once | ORAL | Status: AC
  Administered 2019-03-05: 650 mg via ORAL
  Filled 2019-03-05: qty 2

## 2019-03-05 MED ORDER — FUROSEMIDE 10 MG/ML IJ SOLN
40.0000 mg | Freq: Once | INTRAMUSCULAR | Status: AC
  Administered 2019-03-05: 40 mg via INTRAVENOUS
  Filled 2019-03-05: qty 4

## 2019-03-05 NOTE — Progress Notes (Signed)
Patient ID: Arthur [name], male   DOB: 1933-10-15, 86 y.o.   MRN: [id number] [facility] PROGRESS NOTE  PHU RECORD YTK:[id number] DOB: 02-08-1933 DOA: 03/04/2019 PCP: Baxter Hire, MD  HPI/Subjective: Patient feeling okay.  States he does have some constipation.  The abdominal pain that he had yesterday seems to have gotten better.  Occasionally has some nausea and vomiting.  Hemoglobin this morning dropped down to 5.9.  Sometimes has some shortness of breath and a little cough.  Objective: Vitals:   03/05/19 1113 03/05/19 1124  BP: (!) 173/55 (!) 180/58  Pulse: 87 89  Resp: 20 18  Temp: 98.1 F (36.7 C) 97.8 F (36.6 C)  SpO2: 97% 97%    Intake/Output Summary (Last 24 hours) at 03/05/2019 1150 Last data filed at 03/05/2019 0654 Gross per 24 hour  Intake --  Output 100 ml  Net -100 ml   Filed Weights   03/04/19 1210  Weight: 93 kg    ROS: Review of Systems  Constitutional: Positive for malaise/fatigue. Negative for chills and fever.  Eyes: Negative for blurred vision.  Respiratory: Positive for cough and shortness of breath.   Cardiovascular: Negative for chest pain.  Gastrointestinal: Positive for abdominal pain, constipation and nausea. Negative for diarrhea and vomiting.  Genitourinary: Negative for dysuria.  Musculoskeletal: Negative for joint pain.  Neurological: Negative for dizziness and headaches.   Exam: Physical Exam  Constitutional: He is oriented to person, place, and time.  HENT:  Nose: No mucosal edema.  Mouth/Throat: No oropharyngeal exudate or posterior oropharyngeal edema.  Eyes: Conjunctivae and lids are normal.  Neck: Carotid bruit is not present.  Cardiovascular: S1 normal and S2 normal. Exam reveals no gallop.  No murmur heard. Respiratory: No respiratory distress. He has decreased breath sounds in the right middle field, the right lower field and the left lower field. He has no wheezes. He has no rhonchi. He has no rales.  GI: Soft. Bowel  sounds are normal. There is no abdominal tenderness.  Musculoskeletal:     Right ankle: Swelling present.     Left ankle: Swelling present.  Lymphadenopathy:    He has no cervical adenopathy.  Neurological: He is alert and oriented to person, place, and time. No cranial nerve deficit.  Skin: Skin is warm. No rash noted. Nails show no clubbing.  Psychiatric: He has a normal mood and affect.      Data Reviewed: Basic Metabolic Panel: Recent Labs  Lab 03/04/19 1220 03/05/19 0820  NA 133* 133*  K 3.3* 3.5  CL 95* 94*  CO2 28 27  GLUCOSE 161* 104*  BUN 18 18  CREATININE 1.54* 1.57*  CALCIUM 8.3* 8.2*   Liver Function Tests: Recent Labs  Lab 03/04/19 1220  AST 9*  ALT 8  ALKPHOS 73  BILITOT 0.8  PROT 6.9  ALBUMIN 3.3*   Recent Labs  Lab 03/04/19 1220  LIPASE 24   CBC: Recent Labs  Lab 03/04/19 1220 03/05/19 0331  WBC 7.6 6.5  NEUTROABS 5.4  --   HGB 7.4* 5.9*  HCT 23.3* 18.3*  MCV 91.4 91.5  PLT 366 274   BNP (last 3 results) Recent Labs    07/06/18 1613  BNP 132.0*     CBG: Recent Labs  Lab 03/04/19 1818 03/04/19 2108 03/05/19 0748  GLUCAP 111* 108* 98    Recent Results (from the past 240 hour(s))  SARS CORONAVIRUS 2 (TAT 6-24 HRS) Nasopharyngeal Nasopharyngeal Swab     Status: None  Collection Time: 03/04/19  3:40 PM   Specimen: Nasopharyngeal Swab  Result Value Ref Range Status   SARS Coronavirus 2 NEGATIVE NEGATIVE Final    Comment: (NOTE) SARS-CoV-2 target nucleic acids are NOT DETECTED. The SARS-CoV-2 RNA is generally detectable in upper and lower respiratory specimens during the acute phase of infection. Negative results do not preclude SARS-CoV-2 infection, do not rule out co-infections with other pathogens, and should not be used as the sole basis for treatment or other patient management decisions. Negative results must be combined with clinical observations, patient history, and epidemiological information. The  expected result is Negative. Fact Sheet for Patients: SugarRoll.be Fact Sheet for Healthcare Providers: https://www.woods-mathews.com/ This test is not yet approved or cleared by the [place] FDA and  has been authorized for detection and/or diagnosis of SARS-CoV-2 by FDA under an Emergency Use Authorization (EUA). This EUA will remain  in effect (meaning this test can be used) for the duration of the COVID-19 declaration under Section 56 4(b)(1) of the Act, 21 U.S.C. section 360bbb-3(b)(1), unless the authorization is terminated or revoked sooner. Performed at [facility], [place] [address], [place], [place] [address]      Studies: DG Chest 2 View  Result Date: 03/04/2019 CLINICAL DATA:  Pleural effusion EXAM: CHEST - 2 VIEW COMPARISON:  10/27/2018 FINDINGS: Moderate right pleural effusion with interval progression. Progressive right lower lobe infiltrate. Pulmonary hyperinflation with COPD. No infiltrate or mass on the left. Heart size normal. Negative for heart failure. Atherosclerotic aortic arch. IMPRESSION: COPD. Progression of right pleural effusion and right lower lobe airspace disease. Possible chronic pneumonia however underlying neoplasm possible. Correlate with prior thoracentesis resolved. Consider follow-up CT chest with contrast. Electronically Signed   By: Franchot Gallo M.D.   On: 03/04/2019 14:54   CT Abdomen Pelvis W Contrast  Result Date: 03/04/2019 CLINICAL DATA:  Lower abdominal pain, nausea and vomiting for 1 week. EXAM: CT ABDOMEN AND PELVIS WITH CONTRAST TECHNIQUE: Multidetector CT imaging of the abdomen and pelvis was performed using the standard protocol following bolus administration of intravenous contrast. CONTRAST:  48mL OMNIPAQUE IOHEXOL 300 MG/ML  SOLN COMPARISON:  10/27/2018 FINDINGS: Lower chest: Persistent right pleural effusion with progressive right lower lobe atelectasis. Scattered calcifications are noted  in the atelectatic lung. These appear new and could be due to chronic inflammation or aspiration. I do not see any obvious pleural lesions possible areas of vague pleural enhancement. Thoracentesis may be helpful for diagnostic purposes. The left lung base is grossly clear. Minimal dependent subpleural atelectasis. The heart is normal in size. No pericardial effusion. Stable coronary artery and aortic calcifications. Borderline enlarged subcarinal node measuring 9 mm. Hepatobiliary: No focal hepatic lesions or intrahepatic biliary dilatation. The gallbladder is mildly dilated and contains a 18 mm rim calcified calculus. No gallbladder wall thickening or pericholecystic inflammatory changes to suggest acute cholecystitis. No common bile duct dilatation. Pancreas: No mass, inflammation or ductal dilatation. Small duodenal diverticulum noted near the pancreatic head. Spleen: Normal size. No focal lesions. Adrenals/Urinary Tract: Stable low-attenuation nodule involving the medial limb of the left adrenal gland consistent with a benign adenoma. No worrisome renal lesions or hydronephrosis. No renal or obstructing ureteral calculi. The bladder is unremarkable. No bladder mass or asymmetric bladder wall thickening. Mild prostate gland enlargement with median lobe hypertrophy impressing on the base of the bladder. Stomach/Bowel: The stomach, duodenum, small bowel and colon are grossly normal without oral contrast. No acute inflammatory changes, mass lesions or obstructive findings. The terminal  ileum and appendix are normal. Vascular/Lymphatic: Advanced atherosclerotic calcifications involving the aorta, iliac arteries and branch vessels. No focal aneurysm or dissection. The major venous structures are patent. No mesenteric or retroperitoneal mass or adenopathy. Reproductive: Mild prostate gland enlargement with median lobe hypertrophy impressing on the base of the bladder. The seminal vesicles appear normal. Other: Small  periumbilical abdominal wall hernia containing fat is stable. No pelvic mass or pelvic adenopathy. No free pelvic fluid collections. Musculoskeletal: Vertebral augmentation changes are noted at L2 and L3, new since prior study. No new spine fractures are identified. The hips are normally located. No worrisome bone lesions. IMPRESSION: 1. No acute abdominal/pelvic findings, mass lesions or lymphadenopathy. 2. Persistent right pleural effusion with progressive right lower lobe atelectasis. There are also calcifications in the atelectatic lung which could be due to chronic inflammation or aspiration. Thoracentesis may be helpful for diagnostic purposes. Vague areas of pleural enhancement are suspected. 3. Cholelithiasis.  No CT findings for acute cholecystitis. 4. Advanced atherosclerotic calcifications involving the aorta and branch vessels. 5. Stable left adrenal gland adenoma. 6. Vertebral augmentation changes at L2 and L3. Aortic Atherosclerosis (ICD10-I70.0). Electronically Signed   By: Marijo Sanes M.D.   On: 03/04/2019 13:46   DG Chest Port 1 View  Result Date: 03/05/2019 CLINICAL DATA:  Post right-sided thoracentesis EXAM: PORTABLE CHEST 1 VIEW COMPARISON:  Chest radiograph-03/04/2019; chest CT-10/07/2018; CT abdomen pelvis-03/04/2019 FINDINGS: Grossly unchanged cardiac silhouette and mediastinal contours with partial obscuration of the right heart border secondary to heterogeneous/consolidative opacities, as demonstrated on preceding abdominal CT. Atherosclerotic plaque within thoracic aorta. Interval reduction/resolution of right-sided pleural effusion post thoracentesis with development of a loculated right basilar ex vacuo hydropneumothorax. There is no definitive apical component of this ex vacuo pneumothorax. No mediastinal shift. The left hemithorax remains well aerated. No left-sided pleural effusion. No evidence of edema. No acute osseous abnormalities. IMPRESSION: 1. Interval reduction/resolution  of right-sided pleural effusion post thoracentesis with development of a loculated right basilar ex vacuo hydropneumothorax. 2. Partial obscuration of right heart border secondary to right perihilar heterogeneous/consolidative opacities as demonstrated on preceding abdominal CT. Electronically Signed   By: Sandi Mariscal M.D.   On: 03/05/2019 10:23   US THORACENTESIS ASP PLEURAL SPACE W/IMG GUIDE  Result Date: 03/05/2019 INDICATION: Symptomatic right-sided pleural effusion. Please perform ultrasound-guided thoracentesis for diagnostic and therapeutic purposes. EXAM: US THORACENTESIS ASP PLEURAL SPACE W/IMG GUIDE COMPARISON:  Chest radiograph-earlier same day; CT abdomen pelvis-03/04/2019 Ultrasound-guided right-sided thoracentesis-08/08/2018; 07/10/2018 MEDICATIONS: None. COMPLICATIONS: SIR Level A - No therapy, no consequence. Procedure complicated by development of an asymptomatic right basilar ex vacuo hydropneumothorax. TECHNIQUE: Informed written consent was obtained from the patient after a discussion of the risks, benefits and alternatives to treatment. A timeout was performed prior to the initiation of the procedure. Initial ultrasound scanning demonstrates a moderate sized anechoic right-sided pleural effusion. The lower chest was prepped and draped in the usual sterile fashion. 1% lidocaine was used for local anesthesia. An ultrasound image was saved for documentation purposes. An 8 Fr Safe-T-Centesis catheter was introduced. The thoracentesis was performed. The catheter was removed and a dressing was applied. The patient tolerated the procedure well without immediate post procedural complication. The patient was escorted to have an upright chest radiograph. FINDINGS: A total of approximately 700 cc of serous, slightly blood tinged fluid was removed. Requested samples were sent to the laboratory. IMPRESSION: Successful ultrasound-guided right sided thoracentesis yielding 700 cc of pleural fluid.  Electronically Signed   By: [name].D.  On: 03/05/2019 11:45    Scheduled Meds: . amLODipine  2.5 mg Oral Daily  . ascorbic acid  500 mg Oral QHS  . docusate sodium  100 mg Oral BID  . insulin aspart  0-5 Units Subcutaneous QHS  . insulin aspart  0-9 Units Subcutaneous TID WC  . metoCLOPramide  5 mg Oral TID AC & HS  . polyethylene glycol  17 g Oral QODAY  . potassium chloride  10 mEq Oral QPM  . pravastatin  10 mg Oral QHS  . sodium chloride flush  3 mL Intravenous Q12H  . tamsulosin  0.4 mg Oral Daily  . tiotropium  18 mcg Inhalation Daily  . torsemide  20 mg Oral Q M,W,F   Continuous Infusions: . sodium chloride      Assessment/Plan:  1. Symptomatic anemia with hemoglobin dropping from 7.4 down to 5.9.  Ferritin elevated which goes along with anemia of chronic disease.  Patient had endoscopy and colonoscopy in 2020.  Will transfuse 1 unit of packed red blood cells and check hemoglobin afterwards and tomorrow morning.  LDH normal range.  Haptoglobin sent off.  Case discussed with [name] hematology.  I ordered a stool guaiac. 2. Right pleural effusion status post thoracentesis today 600 mL taken off. 3. Abdominal pain with nausea vomiting.  This seems to have resolved 4. Chronic kidney disease stage IIIb 5. Acute on chronic respiratory failure with hypoxia.  Patient was off oxygen this morning and states that he wears it as needed.  Continue to monitor. 6. Type 2 diabetes mellitus with chronic kidney disease stage IIIb.  Last hemoglobin A1c 6.7.  Patient currently on sliding scale insulin here in the hospital. 7. Essential hypertension on amlodipine 8. GERD on Protonix 9. COPD.  Continue inhalers 10. BPH on Flomax 11. Hyperlipidemia unspecified on Pravachol 12. Hypokalemia.  Replaced.  Code Status:     Code Status Orders  (From admission, onward)         Start     Ordered   03/04/19 1544  Do not attempt resuscitation (DNR)  Continuous    Question Answer Comment   In the event of cardiac or respiratory ARREST Do not call a "code blue"   In the event of cardiac or respiratory ARREST Do not perform Intubation, CPR, defibrillation or ACLS   In the event of cardiac or respiratory ARREST Use medication by any route, position, wound care, and other measures to relive pain and suffering. May use oxygen, suction and manual treatment of airway obstruction as needed for comfort.      03/04/19 1543        Code Status History    Date Active Date Inactive Code Status Order ID Comments User Context   02/18/2019 1832 02/20/2019 2120 DNR [id number]  [name], MD Inpatient   12/24/2018 1730 12/24/2018 2132 Full Code [id number]  [name], MD Inpatient   10/27/2018 2342 11/05/2018 1711 DNR [id number]  Christel Mormon, MD ED   10/27/2018 2342 10/27/2018 2342 Full Code [id number]  Mansy, Arvella Merles, MD ED   08/07/2018 2329 08/09/2018 1634 DNR [id number]  Lance Coon, MD Inpatient   07/25/2018 0955 07/26/2018 1908 DNR [id number]  Bettey Costa, MD Inpatient   07/25/2018 0138 07/25/2018 0954 DNR [id number]  Lance Coon, MD Inpatient   07/06/2018 2054 07/10/2018 1959 DNR [id number]  Henreitta Leber, MD Inpatient   06/27/2018 1150 07/05/2018 1757 DNR [id number]  Lang Snow, [place] Inpatient   06/09/2018 1350 06/14/2018 1730 DNR [id number]  Lang Snow, NP ED   05/23/2018 0919 05/28/2018 2024 DNR [id number]  Demetrios Loll, MD Inpatient   05/23/2018 0104 05/23/2018 0918 Full Code [id number]  Mayer Camel, NP Inpatient   [facility] Planning Activity     Family Communication: Spoke with patient's daughter on the phone and given update Disposition Plan: With drop in hemoglobin today, need to make sure hemoglobin is stable and patient not bleeding prior to any disposition.  Consultants:  Hematology  Time spent: 28 minutes, case discussed with hematology  [place]

## 2019-03-05 NOTE — Progress Notes (Signed)
Patient ID: Arthur [name], male   DOB: [birth year], 86 y.o.   MRN: [id number]  Hemoglobin did come up to 7.1 but since it still low I will give another unit of blood. Dr Loletha Grayer

## 2019-03-05 NOTE — Progress Notes (Signed)
Inpatient Diabetes Program Recommendations  AACE/ADA: New Consensus Statement on Inpatient Glycemic Control   Target Ranges:  Prepandial:   less than 140 mg/dL      Peak postprandial:   less than 180 mg/dL (1-2 hours)      Critically ill patients:  140 - 180 mg/dL   Results for BLASE, BECKNER (MRN [id number]) as of 03/05/2019 12:23  Ref. Range 03/04/2019 18:18 03/04/2019 21:08 03/05/2019 07:48 03/05/2019 12:09  Glucose-Capillary Latest Ref Range: 70 - 99 mg/dL 111 (H) 108 (H) 98 175 (H)  Results for WILGUS, DEYTON (MRN [id number]) as of 03/05/2019 12:23  Ref. Range 10/28/2018 05:00 02/18/2019 19:09  Hemoglobin A1C Latest Ref Range: 4.8 - 5.6 % 6.4 (H) 6.7 (H)   Review of Glycemic Control  Diabetes history: DM2 Outpatient Diabetes medications: Levemir 12 units QHS, Novolog 5 units TID with meals Current orders for Inpatient glycemic control: Novolog 0-9 units TID with meals, Novolog 0-5 units QHS  NOTE: Noted consult. Chart reviewed. Agree with current orders. Will follow along while inpatient.  Thanks, Barnie Alderman, RN, MSN, CDE Diabetes Coordinator Inpatient Diabetes Program [phone] (Team Pager from 8am to 5pm)

## 2019-03-05 NOTE — Plan of Care (Addendum)
PMT note:  Patient is DNR and would not want intubation. He would never want a feeding tube. Patient/family would like to treat the treatable. He is followed by outpatient palliative. Full note to follow.

## 2019-03-05 NOTE — Progress Notes (Signed)
OT Cancellation Note  Patient Details Name: Arthur [name] MRN: [id number] DOB: [birth year]   Cancelled Treatment:    Reason Eval/Treat Not Completed: Medical issues which prohibited therapy  OT consult received and chart reviewed. Upon chart review this AM, pt's most recent lab results noted with Hgb of 5.9 at 0331 and trending downward versus yesterday (7.4). OT will hold evaluation at this time and f/u as pt becomes more medically appropriate for activity. Thank you.  Gerrianne Scale, [place], OTR/L ascom [phone] 03/05/19, 10:56 AM

## 2019-03-05 NOTE — TOC Initial Note (Signed)
Transition of Care [facility]) - Initial/Assessment Note    Patient Details  Name: Arthur [name] MRN: [id number] Date of Birth: November 12, 1933  Transition of Care [facility]) CM/SW Contact:    Magnus Ivan, LCSW Phone Number: 03/05/2019, 2:07 PM  Clinical Narrative:              CSW met with patient at bedside. Explained CSW role. Patient reported he came from [facility], but he said he would like to go home upon discharge if possible. Patient reported that he lives alone and his daughter provides support and transportation to appointments. Patient could not remember name of PCP,  PCP is Dr. Edwina Barth per medical record. CSW submitted request for Nurse Secretary to make PCP appointment within 3-5 days of discharge date. Patient reported he uses [facility]process [facility] on [address] [employer] in [place] and denied any issues with obtaining his medications. Patient reported he has a walker and cane at home. Patient reported he had home health services in the past, but could not recall the name of the agency. Per records, [facility] with [facility]. Patient denied needs at this time. CSW will continue to follow up.   Expected Discharge Plan: [place] Barriers to Discharge: Continued Medical Work up   Patient Goals and CMS Choice        Expected Discharge Plan and Services Expected Discharge Plan: [place]       Living arrangements for the past 2 months: Skilled Nursing Facility(was at [facility])                                      Prior Living Arrangements/Services Living arrangements for the past 2 months: Skilled Nursing Facility(was at [facility]) Lives with:: Self Patient language and need for interpreter reviewed:: Yes Do you feel safe going back to the place where you live?: Yes      Need for Family Participation in Patient Care: Yes (Comment) Care giver support system in place?: Yes (comment) Current home services: DME,  Home PT Criminal Activity/Legal Involvement Pertinent to Current Situation/Hospitalization: No - Comment as needed  Activities of Daily Living Home Assistive Devices/Equipment: Walker (specify type) ADL Screening (condition at time of admission) Patient's cognitive ability adequate to safely complete daily activities?: Yes Is the patient deaf or have difficulty hearing?: Yes Does the patient have difficulty seeing, even when wearing glasses/contacts?: No Does the patient have difficulty concentrating, remembering, or making decisions?: No Patient able to express need for assistance with ADLs?: Yes Does the patient have difficulty dressing or bathing?: Yes Independently performs ADLs?: No Communication: Independent Dressing (OT): Needs assistance Is this a change from baseline?: Pre-admission baseline Grooming: Needs assistance Is this a change from baseline?: Pre-admission baseline Feeding: Independent Bathing: Needs assistance Is this a change from baseline?: Pre-admission baseline Toileting: Needs assistance Is this a change from baseline?: Pre-admission baseline In/Out Bed: Needs assistance Is this a change from baseline?: Pre-admission baseline Walks in Home: Needs assistance Is this a change from baseline?: Pre-admission baseline Does the patient have difficulty walking or climbing stairs?: Yes Weakness of Legs: Both Weakness of Arms/Hands: None  Permission Sought/Granted         Permission granted to share info w AGENCY: [facility]        Emotional Assessment Appearance:: Appears stated age Attitude/Demeanor/Rapport: Engaged Affect (typically observed): Calm Orientation: :  Oriented to Self, Oriented to Place, Oriented to  Time, Oriented to Situation Alcohol / Substance Use: Not Applicable Psych Involvement: No (comment)  Admission diagnosis:  Shortness of breath [R06.02] Pleural effusion [J90] Aspiration pneumonia of right lower lobe, unspecified  aspiration pneumonia type (HCC) [J69.0] Patient Active Problem List   Diagnosis Date Noted  . DNR (do not resuscitate) 03/05/2019  . Generalized abdominal pain   . Gastroesophageal reflux disease without esophagitis   . Pleural effusion on right 03/04/2019  . S/P kyphoplasty 02/18/2019  . Goals of care, counseling/discussion 01/16/2019  . CKD (chronic kidney disease) stage 4, GFR 15-29 ml/min (HCC) 01/16/2019  . CKD stage 4 due to type 2 diabetes mellitus (HCC)   . Closed compression fracture of second lumbar vertebra (HCC)   . CKD stage 3 due to type 2 diabetes mellitus (HCC)   . Chronic respiratory failure with hypoxia (HCC)   . Benign prostatic hyperplasia without lower urinary tract symptoms   . History of anemia due to chronic kidney disease   . Anemia of chronic kidney failure 08/21/2018  . UTI (urinary tract infection) 08/07/2018  . Acute on chronic renal failure (HCC) 08/07/2018  . HLD (hyperlipidemia) 07/24/2018  . Acute on chronic respiratory failure with hypoxia (HCC) 07/06/2018  . Symptomatic anemia 06/27/2018  . GI bleeding 06/09/2018  . Sepsis secondary to UTI (HCC) 05/23/2018  . Cellulitis of right lower extremity 05/22/2018  . Diabetes (HCC) 10/24/2006  . OBESITY 10/24/2006  . Essential hypertension 10/24/2006  . ALLERGIC RHINITIS 10/24/2006  . COPD (chronic obstructive pulmonary disease) (HCC) 10/24/2006  . TOBACCO ABUSE, HX OF 10/24/2006   PCP:  Johnston, John D, MD Pharmacy:   [id number] - [place], [place] - [address] [place] [address] Phone: [phone] Fax: [phone]     Social Determinants of Health (SDOH) Interventions    Readmission Risk Interventions Readmission Risk Prevention Plan 03/05/2019 11/05/2018 10/29/2018  Transportation Screening Complete Complete Complete  PCP or Specialist Appt within 5-7 Days - - -  PCP or Specialist Appt within 3-5 Days - - -  Home Care Screening - - -  Medication Review (RN  CM) - - -  HRI or Home Care Consult - - -  Social Work Consult for Recovery Care Planning/Counseling - - -  Palliative Care Screening - - -  Medication Review (RN Care Manager) Complete Complete Complete  PCP or Specialist appointment within 3-5 days of discharge Complete (No Data) -  HRI or Home Care Consult Complete - -  SW Recovery Care/Counseling Consult - - -  Palliative Care Screening - Not Applicable -  Skilled Nursing Facility - Complete -  Some recent data might be hidden    

## 2019-03-05 NOTE — Progress Notes (Signed)
Hematology/Oncology Progress Note [facility] Telephone:([phone] [phone]  Patient Care Team: Baxter Hire, MD as PCP - General (Internal Medicine) Earlie Server, MD as Consulting Physician (Oncology)   Name of the patient: Arthur [name]  [id number]  October 07, 1933  Date of visit: 03/05/19   INTERVAL HISTORY-  No acute overnight events.  Status post thoracentesis.  Patient has no new complaints.  Continues to feel very fatigued and tired.  He received Aranesp Hemoglobin decreased to 5.9 today and he is receiving 1 unit of PRBC blood transfusion when I stopped by to see him. Denies any abdominal pain, nausea vomiting episodes.      Review of systems- Review of Systems  Constitutional: Positive for fatigue.  HENT:   Negative for sore throat.   Respiratory: Negative for cough.   Gastrointestinal: Negative for abdominal pain.  Musculoskeletal: Negative for arthralgias.  Skin: Negative for rash.  Hematological: Does not bruise/bleed easily.    Allergies  Allergen Reactions  . Codeine Other (See Comments)    Constipation    Patient Active Problem List   Diagnosis Date Noted  . DNR (do not resuscitate) 03/05/2019  . Generalized abdominal pain   . Gastroesophageal reflux disease without esophagitis   . Pleural effusion on right 03/04/2019  . S/P kyphoplasty 02/18/2019  . Goals of care, counseling/discussion 01/16/2019  . CKD (chronic kidney disease) stage 4, GFR 15-29 ml/min (HCC) 01/16/2019  . CKD stage 4 due to type 2 diabetes mellitus ([place])   . Closed compression fracture of second lumbar vertebra ([place])   . CKD stage 3 due to type 2 diabetes mellitus ([place])   . Chronic respiratory failure with hypoxia ([place])   . Benign prostatic hyperplasia without lower urinary tract symptoms   . History of anemia due to chronic kidney disease   . Anemia of chronic kidney failure 08/21/2018  . UTI (urinary tract infection) 08/07/2018  . Acute on chronic  renal failure ([place]) 08/07/2018  . HLD (hyperlipidemia) 07/24/2018  . Acute on chronic respiratory failure with hypoxia ([place]) 07/06/2018  . Symptomatic anemia 06/27/2018  . GI bleeding 06/09/2018  . Sepsis secondary to UTI ([place]) 05/23/2018  . Cellulitis of right lower extremity 05/22/2018  . Diabetes ([place]) 10/24/2006  . OBESITY 10/24/2006  . Essential hypertension 10/24/2006  . ALLERGIC RHINITIS 10/24/2006  . COPD (chronic obstructive pulmonary disease) ([place]) 10/24/2006  . TOBACCO ABUSE, HX OF 10/24/2006     Past Medical History:  Diagnosis Date  . Anemia of chronic kidney failure 08/21/2018  . Arthritis    lower back  . CHF (congestive heart failure) ([place])   . CKD (chronic kidney disease), stage III   . COPD (chronic obstructive pulmonary disease) ([place])   . Diabetes mellitus without complication ([place])    type 2  . Dyspnea    when active -wears oxygen 2L via [place]  . History of blood transfusion   . Hyperlipidemia   . Hypertension   . Requires continuous at home supplemental oxygen    2L via [place]     Past Surgical History:  Procedure Laterality Date  . COLONOSCOPY WITH PROPOFOL N/A 06/11/2018   Procedure: COLONOSCOPY WITH PROPOFOL;  Surgeon: Jonathon Bellows, MD;  Location: [facility] ENDOSCOPY;  Service: Gastroenterology;  Laterality: N/A;  . COLONOSCOPY WITH PROPOFOL N/A 06/13/2018   Procedure: COLONOSCOPY WITH PROPOFOL;  Surgeon: Jonathon Bellows, MD;  Location[facility] ENDOSCOPY;  Service: Gastroenterology;  Laterality: N/A;  . ESOPHAGOGASTRODUODENOSCOPY Left 06/10/2018   Procedure: ESOPHAGOGASTRODUODENOSCOPY (EGD);  Surgeon:  Jonathon Bellows, MD;  Location: [facility] ENDOSCOPY;  Service: Gastroenterology;  Laterality: Left;  . EYE SURGERY     removed cataracts  . GIVENS CAPSULE STUDY N/A 06/28/2018   Procedure: GIVENS CAPSULE STUDY;  Surgeon: Lucilla Lame, MD;  Location: [facility] ENDOSCOPY;  Service: Endoscopy;  Laterality: N/A;  . GIVENS CAPSULE STUDY N/A 06/30/2018   Procedure: GIVENS CAPSULE STUDY;   Surgeon: Jonathon Bellows, MD;  Location: [facility] ENDOSCOPY;  Service: Gastroenterology;  Laterality: N/A;  . KYPHOPLASTY N/A 10/31/2018   Procedure: KYPHOPLASTY L2;  Surgeon: [name], MD;  Location: [facility] ORS;  Service: Orthopedics;  Laterality: N/A;  . KYPHOPLASTY N/A 12/24/2018   Procedure: KYPHOPLASTY L3;  Surgeon: [name], MD;  Location: [facility] ORS;  Service: Orthopedics;  Laterality: N/A;  . KYPHOPLASTY N/A 02/18/2019   Procedure: L2 KYPHOPLASTY;  Surgeon: [name], MD;  Location: [facility] ORS;  Service: Orthopedics;  Laterality: N/A;  . RADIOLOGY WITH ANESTHESIA N/A 01/28/2019   Procedure: MRI LUMBER SPINE WITHOUT CONTRAST;  Surgeon: Radiologist, Medication, MD;  Location: [place];  Service: Radiology;  Laterality: N/A;    Social History   Socioeconomic History  . Marital status: Widowed    Spouse name: Not on file  . Number of children: Not on file  . Years of education: Not on file  . Highest education level: Not on file  Occupational History  . Not on file  Tobacco Use  . Smoking status: Former Smoker    Packs/day: 2.00    Years: 35.00    Pack years: 70.00    Types: Cigarettes    Quit date: 11/04/1982    Years since quitting: 36.3  . Smokeless tobacco: Current User    Types: Chew  Substance and Sexual Activity  . Alcohol use: Not Currently  . Drug use: No  . Sexual activity: Not Currently  Other Topics Concern  . Not on file  Social History Narrative  . Not on file   Social Determinants of Health   Financial Resource Strain: Low Risk   . Difficulty of Paying Living Expenses: Not hard at all  Food Insecurity: No Food Insecurity  . Worried About Charity fundraiser in the Last Year: Never true  . Ran Out of Food in the Last Year: Never true  Transportation Needs: No Transportation Needs  . Lack of Transportation (Medical): No  . Lack of Transportation (Non-Medical): No  Physical Activity: Unknown  . Days of Exercise per Week: 0 days  . Minutes of Exercise per  Session: Not on file  Stress: No Stress Concern Present  . Feeling of Stress : Not at all  Social Connections:   . Frequency of Communication with Friends and Family: Not on file  . Frequency of Social Gatherings with Friends and Family: Not on file  . Attends Religious Services: Not on file  . Active Member of Clubs or Organizations: Not on file  . Attends Archivist Meetings: Not on file  . Marital Status: Not on file  Intimate Partner Violence: Unknown  . Fear of Current or Ex-Partner: Patient refused  . Emotionally Abused: Patient refused  . Physically Abused: Patient refused  . Sexually Abused: Patient refused     Family History  Problem Relation Age of Onset  . COPD Mother   . Cancer Father      Current Facility-Administered Medications:  .  0.9 %  sodium chloride infusion, 250 mL, Intravenous, PRN, Max Sane, MD .  acetaminophen (TYLENOL) tablet 650 mg, 650 mg,  Oral, Q6H PRN **OR** acetaminophen (TYLENOL) suppository 650 mg, 650 mg, Rectal, Q6H PRN, Manuella Ghazi, Vipul, MD .  albuterol (PROVENTIL) (2.5 MG/3ML) 0.083% nebulizer solution 3 mL, 3 mL, Inhalation, Q6H PRN, Manuella Ghazi, Vipul, MD .  amLODipine (NORVASC) tablet 2.5 mg, 2.5 mg, Oral, Daily, Manuella Ghazi, Vipul, MD, 2.5 mg at 03/05/19 1031 .  ascorbic acid (VITAMIN C) tablet 500 mg, 500 mg, Oral, QHS, Manuella Ghazi, Vipul, MD, 500 mg at 03/04/19 2214 .  bisacodyl (DULCOLAX) EC tablet 5 mg, 5 mg, Oral, Daily PRN, Manuella Ghazi, Vipul, MD .  docusate sodium (COLACE) capsule 100 mg, 100 mg, Oral, BID, Manuella Ghazi, Vipul, MD, 100 mg at 03/05/19 1032 .  insulin aspart (novoLOG) injection 0-5 Units, 0-5 Units, Subcutaneous, QHS, Shah, Vipul, MD .  insulin aspart (novoLOG) injection 0-9 Units, 0-9 Units, Subcutaneous, TID WC, Max Sane, MD, 2 Units at 03/05/19 1256 .  metoCLOPramide (REGLAN) tablet 5 mg, 5 mg, Oral, TID AC & HS, Manuella Ghazi, Vipul, MD, 5 mg at 03/05/19 1256 .  ondansetron (ZOFRAN) tablet 4 mg, 4 mg, Oral, Q6H PRN **OR** ondansetron (ZOFRAN)  injection 4 mg, 4 mg, Intravenous, Q6H PRN, Manuella Ghazi, Vipul, MD .  polyethylene glycol (MIRALAX / GLYCOLAX) packet 17 g, 17 g, Oral, QODAY, Manuella Ghazi, Vipul, MD, 17 g at 03/05/19 1033 .  potassium chloride (KLOR-CON) CR tablet 10 mEq, 10 mEq, Oral, QPM, Max Sane, MD, 10 mEq at 03/04/19 1848 .  pravastatin (PRAVACHOL) tablet 10 mg, 10 mg, Oral, QHS, Max Sane, MD, 10 mg at 03/04/19 2214 .  promethazine (PHENERGAN) tablet 25 mg, 25 mg, Oral, Q4H PRN, Manuella Ghazi, Vipul, MD .  sodium chloride flush (NS) 0.9 % injection 3 mL, 3 mL, Intravenous, Q12H, Manuella Ghazi, Vipul, MD, 3 mL at 03/05/19 1041 .  sodium chloride flush (NS) 0.9 % injection 3 mL, 3 mL, Intravenous, PRN, Max Sane, MD, 3 mL at 03/04/19 1703 .  tamsulosin (FLOMAX) capsule 0.4 mg, 0.4 mg, Oral, Daily, Manuella Ghazi, Vipul, MD, 0.4 mg at 03/05/19 1031 .  tiotropium (SPIRIVA) inhalation capsule ([facility] use ONLY) 18 mcg, 18 mcg, Inhalation, Daily, Max Sane, MD, 18 mcg at 03/05/19 1033 .  torsemide (DEMADEX) tablet 20 mg, 20 mg, Oral, Q M,W,F, Manuella Ghazi, Vipul, MD, 20 mg at 03/05/19 1032 .  traZODone (DESYREL) tablet 25 mg, 25 mg, Oral, QHS PRN, Max Sane, MD, 25 mg at 03/04/19 2214   Physical exam:  Vitals:   03/05/19 1023 03/05/19 1103 03/05/19 1113 03/05/19 1124  BP: (!) 180/65 (!) 175/60 (!) 173/55 (!) 180/58  Pulse: 89 88 87 89  Resp: 16 16 20 18   Temp: 97.8 F (36.6 C) 98 F (36.7 C) 98.1 F (36.7 C) 97.8 F (36.6 C)  TempSrc: Oral Oral Oral Oral  SpO2: 100% 100% 97% 97%  Weight:      Height:       Physical Exam  Constitutional: No distress.  Eyes: No scleral icterus.  Pulmonary/Chest: Effort normal.  Decreased breath sound bilaterally.  He breath  comfortably via nasal cannula oxygen  Abdominal: Soft. He exhibits no distension.  Musculoskeletal:        General: No deformity.  Neurological: He is alert.  Skin: There is pallor.       CMP Latest Ref Rng & Units 03/05/2019  Glucose 70 - 99 mg/dL 104(H)  BUN 8 - 23 mg/dL 18  Creatinine 0.61  - 1.24 mg/dL 1.57(H)  Sodium 135 - 145 mmol/L 133(L)  Potassium 3.5 - 5.1 mmol/L 3.5  Chloride 98 - 111 mmol/L 94(L)  CO2  22 - 32 mmol/L 27  Calcium 8.9 - 10.3 mg/dL 8.2(L)  Total Protein 6.5 - 8.1 g/dL -  Total Bilirubin 0.3 - 1.2 mg/dL -  Alkaline Phos 38 - 126 U/L -  AST 15 - 41 U/L -  ALT 0 - 44 U/L -   CBC Latest Ref Rng & Units 03/05/2019  WBC 4.0 - 10.5 K/uL 6.5  Hemoglobin 13.0 - 17.0 g/dL 5.9(L)  Hematocrit 39.0 - 52.0 % 18.3(L)  Platelets 150 - 400 K/uL 274    RADIOGRAPHIC STUDIES: I have personally reviewed the radiological images as listed and agreed with the findings in the report.  DG Chest 2 View  Result Date: 03/04/2019 CLINICAL DATA:  Pleural effusion EXAM: CHEST - 2 VIEW COMPARISON:  10/27/2018 FINDINGS: Moderate right pleural effusion with interval progression. Progressive right lower lobe infiltrate. Pulmonary hyperinflation with COPD. No infiltrate or mass on the left. Heart size normal. Negative for heart failure. Atherosclerotic aortic arch. IMPRESSION: COPD. Progression of right pleural effusion and right lower lobe airspace disease. Possible chronic pneumonia however underlying neoplasm possible. Correlate with prior thoracentesis resolved. Consider follow-up CT chest with contrast. Electronically Signed   By: Franchot Gallo M.D.   On: 03/04/2019 14:54   DG Lumbar Spine 2-3 Views  Result Date: 02/18/2019 CLINICAL DATA:  Kyphoplasty EXAM: LUMBAR SPINE - 2-3 VIEW; DG C-ARM 1-60 MIN COMPARISON:  12/24/2018, MRI 01/28/2019 FINDINGS: Two lateral and 2 AP spot fluoroscopic images of the lumbar spine demonstrate 2 level kyphoplasty changes. Total fluoroscopy time was 2 minutes. IMPRESSION: Intraoperative fluoroscopic images of the lumbar spine for kyphoplasty. Electronically Signed   By: Donavan Foil M.D.   On: 02/18/2019 16:20   CT Abdomen Pelvis W Contrast  Result Date: 03/04/2019 CLINICAL DATA:  Lower abdominal pain, nausea and vomiting for 1 week. EXAM: CT ABDOMEN  AND PELVIS WITH CONTRAST TECHNIQUE: Multidetector CT imaging of the abdomen and pelvis was performed using the standard protocol following bolus administration of intravenous contrast. CONTRAST:  27mL OMNIPAQUE IOHEXOL 300 MG/ML  SOLN COMPARISON:  10/27/2018 FINDINGS: Lower chest: Persistent right pleural effusion with progressive right lower lobe atelectasis. Scattered calcifications are noted in the atelectatic lung. These appear new and could be due to chronic inflammation or aspiration. I do not see any obvious pleural lesions possible areas of vague pleural enhancement. Thoracentesis may be helpful for diagnostic purposes. The left lung base is grossly clear. Minimal dependent subpleural atelectasis. The heart is normal in size. No pericardial effusion. Stable coronary artery and aortic calcifications. Borderline enlarged subcarinal node measuring 9 mm. Hepatobiliary: No focal hepatic lesions or intrahepatic biliary dilatation. The gallbladder is mildly dilated and contains a 18 mm rim calcified calculus. No gallbladder wall thickening or pericholecystic inflammatory changes to suggest acute cholecystitis. No common bile duct dilatation. Pancreas: No mass, inflammation or ductal dilatation. Small duodenal diverticulum noted near the pancreatic head. Spleen: Normal size. No focal lesions. Adrenals/Urinary Tract: Stable low-attenuation nodule involving the medial limb of the left adrenal gland consistent with a benign adenoma. No worrisome renal lesions or hydronephrosis. No renal or obstructing ureteral calculi. The bladder is unremarkable. No bladder mass or asymmetric bladder wall thickening. Mild prostate gland enlargement with median lobe hypertrophy impressing on the base of the bladder. Stomach/Bowel: The stomach, duodenum, small bowel and colon are grossly normal without oral contrast. No acute inflammatory changes, mass lesions or obstructive findings. The terminal ileum and appendix are normal.  Vascular/Lymphatic: Advanced atherosclerotic calcifications involving the aorta, iliac arteries and branch vessels.  No focal aneurysm or dissection. The major venous structures are patent. No mesenteric or retroperitoneal mass or adenopathy. Reproductive: Mild prostate gland enlargement with median lobe hypertrophy impressing on the base of the bladder. The seminal vesicles appear normal. Other: Small periumbilical abdominal wall hernia containing fat is stable. No pelvic mass or pelvic adenopathy. No free pelvic fluid collections. Musculoskeletal: Vertebral augmentation changes are noted at L2 and L3, new since prior study. No new spine fractures are identified. The hips are normally located. No worrisome bone lesions. IMPRESSION: 1. No acute abdominal/pelvic findings, mass lesions or lymphadenopathy. 2. Persistent right pleural effusion with progressive right lower lobe atelectasis. There are also calcifications in the atelectatic lung which could be due to chronic inflammation or aspiration. Thoracentesis may be helpful for diagnostic purposes. Vague areas of pleural enhancement are suspected. 3. Cholelithiasis.  No CT findings for acute cholecystitis. 4. Advanced atherosclerotic calcifications involving the aorta and branch vessels. 5. Stable left adrenal gland adenoma. 6. Vertebral augmentation changes at L2 and L3. Aortic Atherosclerosis (ICD10-I70.0). Electronically Signed   By: Marijo Sanes M.D.   On: 03/04/2019 13:46   DG Chest Port 1 View  Result Date: 03/05/2019 CLINICAL DATA:  Post right-sided thoracentesis EXAM: PORTABLE CHEST 1 VIEW COMPARISON:  Chest radiograph-03/04/2019; chest CT-10/07/2018; CT abdomen pelvis-03/04/2019 FINDINGS: Grossly unchanged cardiac silhouette and mediastinal contours with partial obscuration of the right heart border secondary to heterogeneous/consolidative opacities, as demonstrated on preceding abdominal CT. Atherosclerotic plaque within thoracic aorta. Interval  reduction/resolution of right-sided pleural effusion post thoracentesis with development of a loculated right basilar ex vacuo hydropneumothorax. There is no definitive apical component of this ex vacuo pneumothorax. No mediastinal shift. The left hemithorax remains well aerated. No left-sided pleural effusion. No evidence of edema. No acute osseous abnormalities. IMPRESSION: 1. Interval reduction/resolution of right-sided pleural effusion post thoracentesis with development of a loculated right basilar ex vacuo hydropneumothorax. 2. Partial obscuration of right heart border secondary to right perihilar heterogeneous/consolidative opacities as demonstrated on preceding abdominal CT. Electronically Signed   By: Sandi Mariscal M.D.   On: 03/05/2019 10:23   DG C-Arm 1-60 Min  Result Date: 02/18/2019 CLINICAL DATA:  Kyphoplasty EXAM: LUMBAR SPINE - 2-3 VIEW; DG C-ARM 1-60 MIN COMPARISON:  12/24/2018, MRI 01/28/2019 FINDINGS: Two lateral and 2 AP spot fluoroscopic images of the lumbar spine demonstrate 2 level kyphoplasty changes. Total fluoroscopy time was 2 minutes. IMPRESSION: Intraoperative fluoroscopic images of the lumbar spine for kyphoplasty. Electronically Signed   By: Donavan Foil M.D.   On: 02/18/2019 16:20   US THORACENTESIS ASP PLEURAL SPACE W/IMG GUIDE  Result Date: 03/05/2019 INDICATION: Symptomatic right-sided pleural effusion. Please perform ultrasound-guided thoracentesis for diagnostic and therapeutic purposes. EXAM: US THORACENTESIS ASP PLEURAL SPACE W/IMG GUIDE COMPARISON:  Chest radiograph-earlier same day; CT abdomen pelvis-03/04/2019 Ultrasound-guided right-sided thoracentesis-08/08/2018; 07/10/2018 MEDICATIONS: None. COMPLICATIONS: SIR Level A - No therapy, no consequence. Procedure complicated by development of an asymptomatic right basilar ex vacuo hydropneumothorax. TECHNIQUE: Informed written consent was obtained from the patient after a discussion of the risks, benefits and alternatives  to treatment. A timeout was performed prior to the initiation of the procedure. Initial ultrasound scanning demonstrates a moderate sized anechoic right-sided pleural effusion. The lower chest was prepped and draped in the usual sterile fashion. 1% lidocaine was used for local anesthesia. An ultrasound image was saved for documentation purposes. An 8 Fr Safe-T-Centesis catheter was introduced. The thoracentesis was performed. The catheter was removed and a dressing was applied. The patient tolerated the procedure  well without immediate post procedural complication. The patient was escorted to have an upright chest radiograph. FINDINGS: A total of approximately 700 cc of serous, slightly blood tinged fluid was removed. Requested samples were sent to the laboratory. IMPRESSION: Successful ultrasound-guided right sided thoracentesis yielding 700 cc of pleural fluid. Electronically Signed   By: Sandi Mariscal M.D.   On: 03/05/2019 11:45    Assessment and plan-   #Symptomatic anemia, hemoglobin 5.9, no acute bleeding events.  Iron panel showed ferritin 464, saturation 55.  Not consistent with iron deficiency. Patient is currently receiving 1 unit of PRBC transfusion at the bedside.  I discussed with nurse and ask H&H to be done 1 hour after transfusion.    #Anemia secondary to chronic kidney disease, received 1 dose of Aranesp 150 MCG yesterday. #Right pleural effusion, status post diagnostic and therapeutic thoracentesis, removed 0.7 L of serous likely blood stained fluid.  Fluid analysis is pending.  Previous pleural effusion study showed negative for malignant cells. Goal of care, palliative.  Patient follows up with palliative care service outpatient.   Thank you for allowing me to participate in the care of this patient.   Earlie Server, MD, PhD Hematology Oncology [facility] Pager- [phone] 03/05/2019

## 2019-03-05 NOTE — Procedures (Signed)
Pre procedural Dx: Symptomatic pleural effusion Post procedural Dx: Same  Successful US guided right sided thoracentesis yielding 600 cc of serous, slightly blood tinged pleural fluid.   Samples sent to lab for analysis.  EBL: None Complications: Procedure complicated by development of an asymptomatic right basilar ex vacuo hydropneumothorax.   Ronny Bacon, MD Pager #: [phone]

## 2019-03-05 NOTE — Progress Notes (Signed)
PT Cancellation Note  Patient Details Name: Arthur [name] MRN: [id number] DOB: April 06, 1933   Cancelled Treatment:    Reason Eval/Treat Not Completed: Medical issues which prohibited therapy: Pt's Hgb 5.9 and trending down falling outside guidelines for participation with PT services.  Will attempt to see pt at a future date/time as medically appropriate.     Linus Salmons PT, DPT 03/05/19, 9:15 AM

## 2019-03-06 ENCOUNTER — Inpatient Hospital Stay: Payer: Medicare HMO | Admitting: Oncology

## 2019-03-06 ENCOUNTER — Inpatient Hospital Stay: Payer: Medicare HMO

## 2019-03-06 DIAGNOSIS — Z515 Encounter for palliative care: Secondary | ICD-10-CM

## 2019-03-06 DIAGNOSIS — Z7189 Other specified counseling: Secondary | ICD-10-CM

## 2019-03-06 DIAGNOSIS — K5900 Constipation, unspecified: Secondary | ICD-10-CM

## 2019-03-06 LAB — CBC
HCT: 23.7 % — ABNORMAL LOW (ref 39.0–52.0)
Hemoglobin: 7.7 g/dL — ABNORMAL LOW (ref 13.0–17.0)
MCH: 28.8 pg (ref 26.0–34.0)
MCHC: 32.5 g/dL (ref 30.0–36.0)
MCV: 88.8 fL (ref 80.0–100.0)
Platelets: 288 10*3/uL (ref 150–400)
RBC: 2.67 MIL/uL — ABNORMAL LOW (ref 4.22–5.81)
RDW: 17.9 % — ABNORMAL HIGH (ref 11.5–15.5)
WBC: 8.5 10*3/uL (ref 4.0–10.5)
nRBC: 0.4 % — ABNORMAL HIGH (ref 0.0–0.2)

## 2019-03-06 LAB — PROTEIN, BODY FLUID (OTHER): Total Protein, Body Fluid Other: 3 g/dL

## 2019-03-06 LAB — BASIC METABOLIC PANEL
Anion gap: 10 (ref 5–15)
BUN: 29 mg/dL — ABNORMAL HIGH (ref 8–23)
CO2: 29 mmol/L (ref 22–32)
Calcium: 7.8 mg/dL — ABNORMAL LOW (ref 8.9–10.3)
Chloride: 92 mmol/L — ABNORMAL LOW (ref 98–111)
Creatinine, Ser: 1.84 mg/dL — ABNORMAL HIGH (ref 0.61–1.24)
GFR calc Af Amer: 38 mL/min — ABNORMAL LOW (ref >60)
GFR calc non Af Amer: 33 mL/min — ABNORMAL LOW (ref >60)
Glucose, Bld: 135 mg/dL — ABNORMAL HIGH (ref 70–99)
Potassium: 3.3 mmol/L — ABNORMAL LOW (ref 3.5–5.1)
Sodium: 131 mmol/L — ABNORMAL LOW (ref 135–145)

## 2019-03-06 LAB — GLUCOSE, CAPILLARY
Glucose-Capillary: 129 mg/dL — ABNORMAL HIGH (ref 70–99)
Glucose-Capillary: 247 mg/dL — ABNORMAL HIGH (ref 70–99)
Glucose-Capillary: 185 mg/dL — ABNORMAL HIGH (ref 70–99)
Glucose-Capillary: 133 mg/dL — ABNORMAL HIGH (ref 70–99)

## 2019-03-06 LAB — HAPTOGLOBIN: Haptoglobin: 180 mg/dL (ref 38–329)

## 2019-03-06 LAB — CYTOLOGY - NON PAP

## 2019-03-06 MED ORDER — POTASSIUM CHLORIDE CRYS ER 20 MEQ PO TBCR
40.0000 meq | EXTENDED_RELEASE_TABLET | Freq: Once | ORAL | Status: AC
  Administered 2019-03-06: 40 meq via ORAL
  Filled 2019-03-06: qty 2

## 2019-03-06 MED ORDER — LACTULOSE 10 GM/15ML PO SOLN
30.0000 g | Freq: Three times a day (TID) | ORAL | Status: DC
  Administered 2019-03-06 (×2): 30 g via ORAL
  Filled 2019-03-06 (×3): qty 60

## 2019-03-06 NOTE — Progress Notes (Addendum)
Daily Progress Note   Patient Name: Arthur [name]       Date: 03/06/2019 DOB: 1933-12-22  Age: 86 y.o. MRN#: [id number] Attending Physician: Loletha Grayer, MD Primary Care Physician: Baxter Hire, MD Admit Date: 03/04/2019  Reason for Consultation/Follow-up: Establishing goals of care  Subjective: Patient sitting in bed. He states he is hoping he will feel better as he does not want to be in the hospital. He discusses his frustration with the ongoing work up, and no understanding of why his HGB keeps dropping. He states he just wants to go home and stay there until he leaves this earth. He states he does not want additional healthcare, he would like to focus on comfort based care and go home with hospice.   He states he does not want to die, that he wants to be here for his great-grandson, but does not want to live with his current health concerns, and with having to return to see healthcare providers and hospitals.  Discussed me calling his daughter, and him speaking with her this evening about this prior to changing course. He is in agreement with this plan.  Update:Spoke with daughter. She states she understands what he wants, but is unsure about his ability to come home, and discusses her role strain, along with other family members with trying to provide care. She states she will talk with her father this evening. Discussed home with hospice vs  facility with hospice.      Length of Stay: 2  Current Medications: Scheduled Meds:  . amLODipine  2.5 mg Oral Daily  . ascorbic acid  500 mg Oral QHS  . docusate sodium  100 mg Oral BID  . insulin aspart  0-5 Units Subcutaneous QHS  . insulin aspart  0-9 Units Subcutaneous TID WC  . lactulose  30 g Oral TID  . metoCLOPramide  5 mg Oral  TID AC & HS  . polyethylene glycol  17 g Oral QODAY  . potassium chloride  10 mEq Oral QPM  . pravastatin  10 mg Oral QHS  . sodium chloride flush  3 mL Intravenous Q12H  . tamsulosin  0.4 mg Oral Daily  . tiotropium  18 mcg Inhalation Daily  . torsemide  20 mg Oral Q M,W,F    Continuous Infusions: . sodium chloride  PRN Meds: sodium chloride, acetaminophen **OR** acetaminophen, albuterol, bisacodyl, ondansetron **OR** ondansetron (ZOFRAN) IV, promethazine, sodium chloride flush, traZODone  Physical Exam Pulmonary:     Effort: Pulmonary effort is normal.  Neurological:     Mental Status: He is alert.             Vital Signs: BP (!) 130/50   Pulse 73   Temp 98.6 F (37 C) (Oral)   Resp 16   Ht 5\' 8"  (1.727 m)   Wt 88.4 kg   SpO2 93%   BMI 29.63 kg/m  SpO2: SpO2: 93 % O2 Device: O2 Device: Room Air O2 Flow Rate: O2 Flow Rate (L/min): 3 L/min  Intake/output summary:   Intake/Output Summary (Last 24 hours) at 03/06/2019 1244 Last data filed at 03/06/2019 1033 Gross per 24 hour  Intake 938 ml  Output 1800 ml  Net -862 ml   LBM: Last BM Date: 03/03/19 Baseline Weight: Weight: 93 kg Most recent weight: Weight: 88.4 kg       Palliative Assessment/Data:      Patient Active Problem List   Diagnosis Date Noted  . DNR (do not resuscitate) 03/05/2019  . Generalized abdominal pain   . Gastroesophageal reflux disease without esophagitis   . Status post thoracentesis   . Pleural effusion 03/04/2019  . S/P kyphoplasty 02/18/2019  . Goals of care, counseling/discussion 01/16/2019  . CKD (chronic kidney disease) stage 4, GFR 15-29 ml/min (HCC) 01/16/2019  . CKD stage 4 due to type 2 diabetes mellitus ([place])   . Closed compression fracture of second lumbar vertebra ([place])   . CKD stage 3 due to type 2 diabetes mellitus ([place])   . Chronic respiratory failure with hypoxia ([place])   . Benign prostatic hyperplasia without lower urinary tract symptoms   . History of anemia  due to chronic kidney disease   . Anemia of chronic kidney failure 08/21/2018  . UTI (urinary tract infection) 08/07/2018  . Acute on chronic renal failure ([place]) 08/07/2018  . HLD (hyperlipidemia) 07/24/2018  . Acute on chronic respiratory failure with hypoxia ([place]) 07/06/2018  . Symptomatic anemia 06/27/2018  . GI bleeding 06/09/2018  . Sepsis secondary to UTI ([place]) 05/23/2018  . Cellulitis of right lower extremity 05/22/2018  . Diabetes ([place]) 10/24/2006  . OBESITY 10/24/2006  . Essential hypertension 10/24/2006  . ALLERGIC RHINITIS 10/24/2006  . COPD (chronic obstructive pulmonary disease) ([place]) 10/24/2006  . TOBACCO ABUSE, HX OF 10/24/2006    Palliative Care Assessment & Plan    Recommendations/Plan:  Patient would like to speak with his daughter about transition to comfort care and d/c home with hospice. Will continue to treat the treatable, and will remain DNR,DNI, no feeding tube at this time until that conversation.   Code Status:    Code Status Orders  (From admission, onward)         Start     Ordered   03/04/19 1544  Do not attempt resuscitation (DNR)  Continuous    Question Answer Comment  In the event of cardiac or respiratory ARREST Do not call a "code blue"   In the event of cardiac or respiratory ARREST Do not perform Intubation, CPR, defibrillation or ACLS   In the event of cardiac or respiratory ARREST Use medication by any route, position, wound care, and other measures to relive pain and suffering. May use oxygen, suction and manual treatment of airway obstruction as needed for comfort.      03/04/19 1543  Code Status History    Date Active Date Inactive Code Status Order ID Comments User Context   02/18/2019 1832 02/20/2019 2120 DNR [id number]  [name], MD Inpatient   12/24/2018 1730 12/24/2018 2132 Full Code [id number]  [name], MD Inpatient   10/27/2018 2342 11/05/2018 1711 DNR [id number]  Sidney Ace, Arvella Merles, MD ED   10/27/2018 2342  10/27/2018 2342 Full Code [id number]  Mansy, Arvella Merles, MD ED   08/07/2018 2329 08/09/2018 1634 DNR [id number]  Lance Coon, MD Inpatient   07/25/2018 0955 07/26/2018 1908 DNR [id number]  Bettey Costa, MD Inpatient   07/25/2018 0138 07/25/2018 0954 DNR [id number]  Lance Coon, MD Inpatient   07/06/2018 2054 07/10/2018 1959 DNR [id number]  Henreitta Leber, MD Inpatient   06/27/2018 1150 07/05/2018 1757 DNR [id number]  Lang Snow, NP Inpatient   06/09/2018 1350 06/14/2018 1730 DNR [id number]  Lang Snow, NP ED   05/23/2018 0919 05/28/2018 2024 DNR [id number]  Demetrios Loll, MD Inpatient   05/23/2018 0104 05/23/2018 0918 Full Code [id number]  Mayer Camel, NP Inpatient   Advance Care Planning Activity       Prognosis:  < 6 months, poor PO intake, anemia.     Care plan was discussed with RN  Thank you for allowing the Palliative Medicine Team to assist in the care of this patient.   Total Time 35 min Prolonged Time Billed no      Greater than 50%  of this time was spent counseling and coordinating care related to the above assessment and plan.  Asencion Gowda, NP  Please contact Palliative Medicine Team phone at [phone] for questions and concerns.

## 2019-03-06 NOTE — Consult Note (Addendum)
Consultation Note Date: 03/06/2019   Patient Name: Arthur [name]  DOB: 22-May-1933  MRN: [id number]  Age / Sex: 86 y.o., male  PCP: Baxter Hire, MD Referring Physician: Loletha Grayer, MD  Reason for Consultation: Establishing goals of care  HPI/Patient Profile: Arthur [name]  is a 86 y.o. male with a known history of COPD on 3 L nasal cannula, CHF, CKD, hypertension, hyperlipidemia, diabetes, and anemia is being admitted for worsening pleural effusion.  Patient had a L2 KYPHOPLASTY on2/16/2021 after which he was discharged on February 18 to [facility]. Patient reports that he has been dealing with persistent abdominal pain over the past 3 weeks.   Clinical Assessment and Goals of Care: Patient is resting in bed, daughter is at bedside. She discusses 3 back surgeries, and 1 fall in December. Prior to his admission to rehab following this most recent surgery, he lived at home. Since his second surgery, his family has been coming to see him so that someone is there for most of the day. He is widowed.  He uses home O2 at 2 lpm. Functionally, he uses a walker, and requires assistance with bathing, dressing, and his family cooks for him. Daughter states his appetite has decreased since his most recent surgery. She advises at the rehab facility, he has not been eating well. She states [name] is currently followed by a palliative team outpatient.      We discussed his diagnoses, prognosis, and GOC.  A detailed discussion was had today regarding advanced directives.  Concepts specific to code status, artifical feeding and hydration, IV antibiotics and rehospitalization were discussed.  The difference between an aggressive medical intervention path and a comfort care path was discussed.  Values and goals of care important to patient and family were attempted to be elicited.  Discussed limitations of medical  interventions to prolong quality of life in some situations and discussed the concept of human mortality.  Patient confirms he would not want CPR, and would want "to go naturally." He states he would never want to be placed on a ventilator, and would never want a feeding tube. He would like to continue to treat the treatable. Patient and family would like to determine cause of anemia if possible.       SUMMARY OF RECOMMENDATIONS   DNR/DNI, no feeding tube. Family would like to find the cause of anemia if possible. Treat the treatable.    Prognosis:   Unable to determine      Primary Diagnoses: Present on Admission: **None**   I have reviewed the medical record, interviewed the patient and family, and examined the patient. The following aspects are pertinent.  Past Medical History:  Diagnosis Date  . Anemia of chronic kidney failure 08/21/2018  . Arthritis    lower back  . CHF (congestive heart failure) ([place])   . CKD (chronic kidney disease), stage III   . COPD (chronic obstructive pulmonary disease) ([place])   . Diabetes mellitus without complication ([place])    type 2  .  Dyspnea    when active -wears oxygen 2L via [place]  . History of blood transfusion   . Hyperlipidemia   . Hypertension   . Requires continuous at home supplemental oxygen    2L via [place]   Social History   Socioeconomic History  . Marital status: Widowed    Spouse name: Not on file  . Number of children: Not on file  . Years of education: Not on file  . Highest education level: Not on file  Occupational History  . Not on file  Tobacco Use  . Smoking status: Former Smoker    Packs/day: 2.00    Years: 35.00    Pack years: 70.00    Types: Cigarettes    Quit date: 11/04/1982    Years since quitting: 36.3  . Smokeless tobacco: Current User    Types: Chew  Substance and Sexual Activity  . Alcohol use: Not Currently  . Drug use: No  . Sexual activity: Not Currently  Other Topics Concern  . Not on file    Social History Narrative  . Not on file   Social Determinants of Health   Financial Resource Strain: Low Risk   . Difficulty of Paying Living Expenses: Not hard at all  Food Insecurity: No Food Insecurity  . Worried About Charity fundraiser in the Last Year: Never true  . Ran Out of Food in the Last Year: Never true  Transportation Needs: No Transportation Needs  . Lack of Transportation (Medical): No  . Lack of Transportation (Non-Medical): No  Physical Activity: Unknown  . Days of Exercise per Week: 0 days  . Minutes of Exercise per Session: Not on file  Stress: No Stress Concern Present  . Feeling of Stress : Not at all  Social Connections:   . Frequency of Communication with Friends and Family: Not on file  . Frequency of Social Gatherings with Friends and Family: Not on file  . Attends Religious Services: Not on file  . Active Member of Clubs or Organizations: Not on file  . Attends Archivist Meetings: Not on file  . Marital Status: Not on file   Family History  Problem Relation Age of Onset  . COPD Mother   . Cancer Father    Scheduled Meds: . amLODipine  2.5 mg Oral Daily  . ascorbic acid  500 mg Oral QHS  . docusate sodium  100 mg Oral BID  . insulin aspart  0-5 Units Subcutaneous QHS  . insulin aspart  0-9 Units Subcutaneous TID WC  . metoCLOPramide  5 mg Oral TID AC & HS  . polyethylene glycol  17 g Oral QODAY  . potassium chloride  10 mEq Oral QPM  . pravastatin  10 mg Oral QHS  . sodium chloride flush  3 mL Intravenous Q12H  . tamsulosin  0.4 mg Oral Daily  . tiotropium  18 mcg Inhalation Daily  . torsemide  20 mg Oral Q M,W,F   Continuous Infusions: . sodium chloride     PRN Meds:.sodium chloride, acetaminophen **OR** acetaminophen, albuterol, bisacodyl, ondansetron **OR** ondansetron (ZOFRAN) IV, promethazine, sodium chloride flush, traZODone Medications Prior to Admission:  Prior to Admission medications   Medication Sig Start Date End  Date Taking? Authorizing Provider  albuterol (VENTOLIN HFA) 108 (90 Base) MCG/ACT inhaler Inhale 2 puffs into the lungs every 6 (six) hours as needed for wheezing or shortness of breath. 05/27/18  Yes Demetrios Loll, MD  amLODipine (NORVASC) 2.5 MG tablet Take 2.5  mg by mouth daily.   Yes [provider]  ascorbic acid (VITAMIN C) 500 MG tablet Take 500 mg by mouth at bedtime.   Yes [provider]  docusate sodium (COLACE) 100 MG capsule Take 1 capsule (100 mg total) by mouth 2 (two) times daily. 11/05/18  Yes Wieting, Richard, MD  HYDROcodone-acetaminophen (NORCO) 5-325 MG tablet Take 1 tablet by mouth every 6 (six) hours as needed. Patient taking differently: Take 1 tablet by mouth every 6 (six) hours as needed for moderate pain or severe pain.  02/19/19  Yes Duanne Guess, PA-C  insulin aspart (NOVOLOG) 100 UNIT/ML injection Inject 5 Units into the skin 3 (three) times daily with meals. 11/05/18  Yes Wieting, Richard, MD  LEVEMIR 100 UNIT/ML injection Inject 0.12 mLs (12 Units total) into the skin daily. Patient taking differently: Inject 12 Units into the skin at bedtime.  07/10/18  Yes Fritzi Mandes, MD  metoCLOPramide (REGLAN) 5 MG tablet Take 5 mg by mouth 4 (four) times daily -  before meals and at bedtime.   Yes [provider]  pantoprazole (PROTONIX) 40 MG tablet Take 1 tablet (40 mg total) by mouth 2 (two) times daily. Patient taking differently: Take 40 mg by mouth daily.  06/14/18  Yes Lang Snow, NP  polyethylene glycol powder (GLYCOLAX/MIRALAX) 17 GM/SCOOP powder Take 17 g by mouth every other day.  01/08/19  Yes [provider]  potassium chloride (KLOR-CON) 10 MEQ tablet Take 10 mEq by mouth every evening.   Yes [provider]  pravastatin (PRAVACHOL) 10 MG tablet Take 10 mg by mouth at bedtime. 04/25/18  Yes [provider]  promethazine (PHENERGAN) 25 MG tablet Take 25 mg by mouth every 4 (four) hours as needed for nausea or  vomiting.   Yes [provider]  tamsulosin (FLOMAX) 0.4 MG CAPS capsule Take 0.4 mg by mouth daily. 04/25/18  Yes [provider]  tiotropium (SPIRIVA) 18 MCG inhalation capsule Place 18 mcg into inhaler and inhale daily.   Yes [provider]  torsemide (DEMADEX) 20 MG tablet Take 20 mg by mouth every Monday, Wednesday, and Friday.   Yes [provider]   Allergies  Allergen Reactions  . Codeine Other (See Comments)    Constipation   Review of Systems  All other systems reviewed and are negative.   Physical Exam Pulmonary:     Effort: Pulmonary effort is normal.  Skin:    General: Skin is warm and dry.  Neurological:     Mental Status: He is alert.     Vital Signs: BP (!) 140/56 (BP Location: Right Arm)   Pulse 71   Temp 98.3 F (36.8 C) (Oral)   Resp 18   Ht 5\' 8"  (1.727 m)   Wt 88.4 kg   SpO2 95%   BMI 29.63 kg/m  Pain Scale: 0-10   Pain Score: 0-No pain   SpO2: SpO2: 95 % O2 Device:SpO2: 95 % O2 Flow Rate: .O2 Flow Rate (L/min): 3 L/min  IO: Intake/output summary:   Intake/Output Summary (Last 24 hours) at 03/06/2019 0915 Last data filed at 03/06/2019 0658 Gross per 24 hour  Intake 698 ml  Output 1800 ml  Net -1102 ml    LBM: Last BM Date: 03/03/19 Baseline Weight: Weight: 93 kg Most recent weight: Weight: 88.4 kg     Palliative Assessment/Data:     Time In: 4:15 Time Out: 5:05 Time Total: 50 min Greater than 50%  of this time  was spent counseling and coordinating care related to the above assessment and plan.  Signed by: Asencion Gowda, NP   Please contact Palliative Medicine Team phone at [phone] for questions and concerns.  For individual provider: See Shea Evans

## 2019-03-06 NOTE — Progress Notes (Signed)
Patient ID: Arthur [name], male   DOB: 1933/02/16, 86 y.o.   MRN: [id number] [facility] PROGRESS NOTE  TEMITAYO COVALT IFO:[id number] DOB: 03-07-1933 DOA: 03/04/2019 PCP: Baxter Hire, MD  HPI/Subjective: Patient feeling better today with regards to his breathing.  Only slight shortness of breath.  Still feels very weak.  Still has not had a bowel movement.  No further nausea vomiting or abdominal pain.  Objective: Vitals:   03/06/19 0456 03/06/19 1145  BP: (!) 140/56 (!) 130/50  Pulse: 71 73  Resp: 18 16  Temp: 98.3 F (36.8 C) 98.6 F (37 C)  SpO2: 95% 93%    Intake/Output Summary (Last 24 hours) at 03/06/2019 1248 Last data filed at 03/06/2019 1033 Gross per 24 hour  Intake 938 ml  Output 1800 ml  Net -862 ml   Filed Weights   03/04/19 1210 03/06/19 0500  Weight: 93 kg 88.4 kg    ROS: Review of Systems  Constitutional: Positive for malaise/fatigue. Negative for chills and fever.  Eyes: Negative for blurred vision.  Respiratory: Positive for shortness of breath. Negative for cough.   Cardiovascular: Negative for chest pain.  Gastrointestinal: Positive for constipation. Negative for abdominal pain, diarrhea, nausea and vomiting.  Genitourinary: Negative for dysuria.  Musculoskeletal: Negative for joint pain.  Neurological: Negative for dizziness and headaches.   Exam: Physical Exam  Constitutional: He is oriented to person, place, and time.  HENT:  Nose: No mucosal edema.  Mouth/Throat: No oropharyngeal exudate or posterior oropharyngeal edema.  Eyes: Conjunctivae and lids are normal.  Neck: Carotid bruit is not present.  Cardiovascular: S1 normal and S2 normal. Exam reveals no gallop.  No murmur heard. Respiratory: No respiratory distress. He has decreased breath sounds in the right lower field and the left lower field. He has no wheezes. He has no rhonchi. He has no rales.  GI: Soft. Bowel sounds are normal. There is no abdominal tenderness.  Musculoskeletal:      Right ankle: Swelling present.     Left ankle: Swelling present.  Lymphadenopathy:    He has no cervical adenopathy.  Neurological: He is alert and oriented to person, place, and time. No cranial nerve deficit.  Skin: Skin is warm. No rash noted. Nails show no clubbing.  Psychiatric: He has a normal mood and affect.      Data Reviewed: Basic Metabolic Panel: Recent Labs  Lab 03/04/19 1220 03/05/19 0820 03/06/19 0542  NA 133* 133* 131*  K 3.3* 3.5 3.3*  CL 95* 94* 92*  CO2 28 27 29   GLUCOSE 161* 104* 135*  BUN 18 18 29*  CREATININE 1.54* 1.57* 1.84*  CALCIUM 8.3* 8.2* 7.8*   Liver Function Tests: Recent Labs  Lab 03/04/19 1220  AST 9*  ALT 8  ALKPHOS 73  BILITOT 0.8  PROT 6.9  ALBUMIN 3.3*   Recent Labs  Lab 03/04/19 1220  LIPASE 24   CBC: Recent Labs  Lab 03/04/19 1220 03/05/19 0331 03/05/19 1701 03/06/19 0542  WBC 7.6 6.5  --  8.5  NEUTROABS 5.4  --   --   --   HGB 7.4* 5.9* 7.1* 7.7*  HCT 23.3* 18.3* 22.3* 23.7*  MCV 91.4 91.5  --  88.8  PLT 366 274  --  288   BNP (last 3 results) Recent Labs    07/06/18 1613  BNP 132.0*     CBG: Recent Labs  Lab 03/05/19 1209 03/05/19 1721 03/05/19 2106 03/06/19 0747 03/06/19 1144  GLUCAP 175* 156*  149* 129* 247*    Recent Results (from the past 240 hour(s))  SARS CORONAVIRUS 2 (TAT 6-24 HRS) Nasopharyngeal Nasopharyngeal Swab     Status: None   Collection Time: 03/04/19  3:40 PM   Specimen: Nasopharyngeal Swab  Result Value Ref Range Status   SARS Coronavirus 2 NEGATIVE NEGATIVE Final    Comment: (NOTE) SARS-CoV-2 target nucleic acids are NOT DETECTED. The SARS-CoV-2 RNA is generally detectable in upper and lower respiratory specimens during the acute phase of infection. Negative results do not preclude SARS-CoV-2 infection, do not rule out co-infections with other pathogens, and should not be used as the sole basis for treatment or other patient management decisions. Negative results  must be combined with clinical observations, patient history, and epidemiological information. The expected result is Negative. Fact Sheet for Patients: SugarRoll.be Fact Sheet for Healthcare Providers: https://www.woods-mathews.com/ This test is not yet approved or cleared by the [place] FDA and  has been authorized for detection and/or diagnosis of SARS-CoV-2 by FDA under an Emergency Use Authorization (EUA). This EUA will remain  in effect (meaning this test can be used) for the duration of the COVID-19 declaration under Section 56 4(b)(1) of the Act, 21 U.S.C. section 360bbb-3(b)(1), unless the authorization is terminated or revoked sooner. Performed at [facility], [place] [address], [place], [place] [address]   Body fluid culture     Status: None (Preliminary result)   Collection Time: 03/05/19 10:12 AM   Specimen: PATH Cytology Pleural fluid  Result Value Ref Range Status   Specimen Description   Final    PLEURAL Performed at [facility], [address] [place], [place] [address]    Special Requests   Final    NONE Performed at [facility], [address] [place], [place] [address]    Gram Stain   Final    RARE WBC PRESENT, PREDOMINANTLY MONONUCLEAR NO ORGANISMS SEEN    Culture   Final    NO GROWTH < 24 HOURS Performed at [facility], [place] [address] [place], [place] [address]    Report Status PENDING  Incomplete     Studies: DG Chest 2 View  Result Date: 03/04/2019 CLINICAL DATA:  Pleural effusion EXAM: CHEST - 2 VIEW COMPARISON:  10/27/2018 FINDINGS: Moderate right pleural effusion with interval progression. Progressive right lower lobe infiltrate. Pulmonary hyperinflation with COPD. No infiltrate or mass on the left. Heart size normal. Negative for heart failure. Atherosclerotic aortic arch. IMPRESSION: COPD. Progression of right pleural effusion and right lower lobe airspace disease.  Possible chronic pneumonia however underlying neoplasm possible. Correlate with prior thoracentesis resolved. Consider follow-up CT chest with contrast. Electronically Signed   By: Franchot Gallo M.D.   On: 03/04/2019 14:54   CT Abdomen Pelvis W Contrast  Result Date: 03/04/2019 CLINICAL DATA:  Lower abdominal pain, nausea and vomiting for 1 week. EXAM: CT ABDOMEN AND PELVIS WITH CONTRAST TECHNIQUE: Multidetector CT imaging of the abdomen and pelvis was performed using the standard protocol following bolus administration of intravenous contrast. CONTRAST:  87mL OMNIPAQUE IOHEXOL 300 MG/ML  SOLN COMPARISON:  10/27/2018 FINDINGS: Lower chest: Persistent right pleural effusion with progressive right lower lobe atelectasis. Scattered calcifications are noted in the atelectatic lung. These appear new and could be due to chronic inflammation or aspiration. I do not see any obvious pleural lesions possible areas of vague pleural enhancement. Thoracentesis may be helpful for diagnostic purposes. The left lung base is grossly clear. Minimal dependent subpleural atelectasis.  The heart is normal in size. No pericardial effusion. Stable coronary artery and aortic calcifications. Borderline enlarged subcarinal node measuring 9 mm. Hepatobiliary: No focal hepatic lesions or intrahepatic biliary dilatation. The gallbladder is mildly dilated and contains a 18 mm rim calcified calculus. No gallbladder wall thickening or pericholecystic inflammatory changes to suggest acute cholecystitis. No common bile duct dilatation. Pancreas: No mass, inflammation or ductal dilatation. Small duodenal diverticulum noted near the pancreatic head. Spleen: Normal size. No focal lesions. Adrenals/Urinary Tract: Stable low-attenuation nodule involving the medial limb of the left adrenal gland consistent with a benign adenoma. No worrisome renal lesions or hydronephrosis. No renal or obstructing ureteral calculi. The bladder is unremarkable. No  bladder mass or asymmetric bladder wall thickening. Mild prostate gland enlargement with median lobe hypertrophy impressing on the base of the bladder. Stomach/Bowel: The stomach, duodenum, small bowel and colon are grossly normal without oral contrast. No acute inflammatory changes, mass lesions or obstructive findings. The terminal ileum and appendix are normal. Vascular/Lymphatic: Advanced atherosclerotic calcifications involving the aorta, iliac arteries and branch vessels. No focal aneurysm or dissection. The major venous structures are patent. No mesenteric or retroperitoneal mass or adenopathy. Reproductive: Mild prostate gland enlargement with median lobe hypertrophy impressing on the base of the bladder. The seminal vesicles appear normal. Other: Small periumbilical abdominal wall hernia containing fat is stable. No pelvic mass or pelvic adenopathy. No free pelvic fluid collections. Musculoskeletal: Vertebral augmentation changes are noted at L2 and L3, new since prior study. No new spine fractures are identified. The hips are normally located. No worrisome bone lesions. IMPRESSION: 1. No acute abdominal/pelvic findings, mass lesions or lymphadenopathy. 2. Persistent right pleural effusion with progressive right lower lobe atelectasis. There are also calcifications in the atelectatic lung which could be due to chronic inflammation or aspiration. Thoracentesis may be helpful for diagnostic purposes. Vague areas of pleural enhancement are suspected. 3. Cholelithiasis.  No CT findings for acute cholecystitis. 4. Advanced atherosclerotic calcifications involving the aorta and branch vessels. 5. Stable left adrenal gland adenoma. 6. Vertebral augmentation changes at L2 and L3. Aortic Atherosclerosis (ICD10-I70.0). Electronically Signed   By: Marijo Sanes M.D.   On: 03/04/2019 13:46   DG Chest Port 1 View  Result Date: 03/05/2019 CLINICAL DATA:  Post right-sided thoracentesis EXAM: PORTABLE CHEST 1 VIEW  COMPARISON:  Chest radiograph-03/04/2019; chest CT-10/07/2018; CT abdomen pelvis-03/04/2019 FINDINGS: Grossly unchanged cardiac silhouette and mediastinal contours with partial obscuration of the right heart border secondary to heterogeneous/consolidative opacities, as demonstrated on preceding abdominal CT. Atherosclerotic plaque within thoracic aorta. Interval reduction/resolution of right-sided pleural effusion post thoracentesis with development of a loculated right basilar ex vacuo hydropneumothorax. There is no definitive apical component of this ex vacuo pneumothorax. No mediastinal shift. The left hemithorax remains well aerated. No left-sided pleural effusion. No evidence of edema. No acute osseous abnormalities. IMPRESSION: 1. Interval reduction/resolution of right-sided pleural effusion post thoracentesis with development of a loculated right basilar ex vacuo hydropneumothorax. 2. Partial obscuration of right heart border secondary to right perihilar heterogeneous/consolidative opacities as demonstrated on preceding abdominal CT. Electronically Signed   By: Sandi Mariscal M.D.   On: 03/05/2019 10:23   US THORACENTESIS ASP PLEURAL SPACE W/IMG GUIDE  Result Date: 03/05/2019 INDICATION: Symptomatic right-sided pleural effusion. Please perform ultrasound-guided thoracentesis for diagnostic and therapeutic purposes. EXAM: US THORACENTESIS ASP PLEURAL SPACE W/IMG GUIDE COMPARISON:  Chest radiograph-earlier same day; CT abdomen pelvis-03/04/2019 Ultrasound-guided right-sided thoracentesis-08/08/2018; 07/10/2018 MEDICATIONS: None. COMPLICATIONS: SIR Level A - No therapy, no consequence. Procedure  complicated by development of an asymptomatic right basilar ex vacuo hydropneumothorax. TECHNIQUE: Informed written consent was obtained from the patient after a discussion of the risks, benefits and alternatives to treatment. A timeout was performed prior to the initiation of the procedure. Initial ultrasound scanning  demonstrates a moderate sized anechoic right-sided pleural effusion. The lower chest was prepped and draped in the usual sterile fashion. 1% lidocaine was used for local anesthesia. An ultrasound image was saved for documentation purposes. An 8 Fr Safe-T-Centesis catheter was introduced. The thoracentesis was performed. The catheter was removed and a dressing was applied. The patient tolerated the procedure well without immediate post procedural complication. The patient was escorted to have an upright chest radiograph. FINDINGS: A total of approximately 700 cc of serous, slightly blood tinged fluid was removed. Requested samples were sent to the laboratory. IMPRESSION: Successful ultrasound-guided right sided thoracentesis yielding 700 cc of pleural fluid. Electronically Signed   By: Sandi Mariscal M.D.   On: 03/05/2019 11:45    Scheduled Meds: . amLODipine  2.5 mg Oral Daily  . ascorbic acid  500 mg Oral QHS  . docusate sodium  100 mg Oral BID  . insulin aspart  0-5 Units Subcutaneous QHS  . insulin aspart  0-9 Units Subcutaneous TID WC  . lactulose  30 g Oral TID  . metoCLOPramide  5 mg Oral TID AC & HS  . polyethylene glycol  17 g Oral QODAY  . potassium chloride  10 mEq Oral QPM  . pravastatin  10 mg Oral QHS  . sodium chloride flush  3 mL Intravenous Q12H  . tamsulosin  0.4 mg Oral Daily  . tiotropium  18 mcg Inhalation Daily  . torsemide  20 mg Oral Q M,W,F   Continuous Infusions: . sodium chloride      Assessment/Plan:  1. Symptomatic anemia with hemoglobin dropping from 7.4 down to 5.9.  Patient given 2 units of packed red blood cells yesterday and hemoglobin did come up to 7.7.  I ordered a stool guaiac.  Recheck hemoglobin tomorrow morning. 2. Right pleural effusion status post thoracentesis 03/05/2019 and 600 mL taken off.  Patient states he is breathing better but still has decreased breath sounds bilaterally. 3. Constipation.   Patient still has not had a bowel movement and is  constipated I will give the lactulose 3 times daily until bowel movement.  The patient's abdominal pain nausea vomiting seems to have resolved. 4. Chronic kidney disease stage IIIb 5. Acute on chronic respiratory failure with hypoxia.  Patient was off oxygen this morning and states that he wears it as needed.  Continue to monitor. 6. Type 2 diabetes mellitus with chronic kidney disease stage IIIb.  Last hemoglobin A1c 6.7.  Patient currently on sliding scale insulin here in the hospital. 7. Essential hypertension on amlodipine 8. GERD on Protonix 9. COPD.  Continue inhalers 10. BPH on Flomax 11. Hyperlipidemia unspecified on Pravachol 12. Hypokalemia.  Replaced. 13. Weakness.  Awaiting physical therapy evaluation.  Potentially back to [facility] tomorrow  Code Status:     Code Status Orders  (From admission, onward)         Start     Ordered   03/04/19 1544  Do not attempt resuscitation (DNR)  Continuous    Question Answer Comment  In the event of cardiac or respiratory ARREST Do not call a "code blue"   In the event of cardiac or respiratory ARREST Do not perform Intubation, CPR, defibrillation or ACLS  In the event of cardiac or respiratory ARREST Use medication by any route, position, wound care, and other measures to relive pain and suffering. May use oxygen, suction and manual treatment of airway obstruction as needed for comfort.      03/04/19 1543        Code Status History    Date Active Date Inactive Code Status Order ID Comments User Context   02/18/2019 1832 02/20/2019 2120 DNR [id number]  [name], MD Inpatient   12/24/2018 1730 12/24/2018 2132 Full Code [id number]  [name], MD Inpatient   10/27/2018 2342 11/05/2018 1711 DNR [id number]  Sidney Ace, Arvella Merles, MD ED   10/27/2018 2342 10/27/2018 2342 Full Code [id number]  Mansy, Arvella Merles, MD ED   08/07/2018 2329 08/09/2018 1634 DNR [id number]  Lance Coon, MD Inpatient   07/25/2018 0955 07/26/2018 1908 DNR [id number]   Bettey Costa, MD Inpatient   07/25/2018 0138 07/25/2018 0954 DNR [id number]  Lance Coon, MD Inpatient   07/06/2018 2054 07/10/2018 1959 DNR [id number]  Henreitta Leber, MD Inpatient   06/27/2018 1150 07/05/2018 1757 DNR [id number]  Lang Snow, NP Inpatient   06/09/2018 1350 06/14/2018 1730 DNR [id number]  Lang Snow, NP ED   05/23/2018 0919 05/28/2018 2024 DNR [id number]  Demetrios Loll, MD Inpatient   05/23/2018 0104 05/23/2018 0918 Full Code [id number]  Mayer Camel, NP Inpatient   Advance Care Planning Activity     Family Communication: Spoke with patient's daughter on the phone and given update Disposition Plan: Awaiting physical therapy reevaluation today.  Potentially back to [facility].  Would like to check hemoglobin again tomorrow morning to make sure he is not dropping down.  Consultants:  Hematology  Time spent: 28 minutes  [place]

## 2019-03-06 NOTE — Evaluation (Signed)
Occupational Therapy Evaluation Patient Details Name: Arthur [name] MRN: [id number] DOB: 06-Feb-1933 Today's Date: 03/06/2019    History of Present Illness Pt is an 86 y/o male with PMH including anemia, CHF, CKD, COPD, DM, 2 L home O2, htn, L2 and L3 kyphoplasty, dyspnea when active. Recent stay in which pt underwent L2 repeat kyphoplasty 2/16 secondary closed wedge fx of L2. Pt presented to ED with abdominal pain on 03/04/2019. Pt found to have pleural effusion now s/p thorcentesis 3/3 and symptomatic anemia, now s/p 2 transfusions, and stool guaiac pending.   Clinical Impression   Pt was seen for OT evaluation this date. Prior to hospital admission, pt was staying at [facility] following recent stay with above named sx. Typically pt reports living in two level home and stay on main level with daughter assisting with IADLs such as meals, groceries and transportation and some higher level balance BADLs such as bathing/dressing. Pt is somewhat questionable historian, unsure of efficacy of his PLOF/home layout report. Currently pt demonstrates impairments as described below (See OT problem list) which functionally limit his ability to perform ADL/self-care tasks. Pt currently requires MOD/MAX A for bed mobility to transition to/from EOB sitting. Very little tolerance (~30 seconds) in EOB static sitting with MOD A for support. Unable to assess ADLs in sitting d/t limited sitting tolerance 2/2 c/o low back pain. At bed level, pt able to perform UB grooming, feeding with setup with HOB elevated and while he makes attempt to bend LEs to participate in LB dressing, he ultimately requires MAX A.  Pt somewhat confused in terms of orientation, but is mostly appropriate conversationally and with following commands throughout session. Pt would benefit from skilled OT to address noted impairments and functional limitations (see below for any additional details) in order to maximize safety and independence while  minimizing falls risk and caregiver burden. Upon hospital discharge, recommend SNF to maximize pt safety.     Follow Up Recommendations  SNF    Equipment Recommendations  Other (comment)(defer to next level of care. If pt does not return to SNF, anticipate need for hospital bed)    Recommendations for Other Services       Precautions / Restrictions Precautions Precautions: Fall;Back Precaution Comments: s/p L2 kyphoplasty on 2/16 Restrictions Weight Bearing Restrictions: No      Mobility Bed Mobility Overal bed mobility: Needs Assistance Bed Mobility: Sidelying to Sit;Sit to Sidelying;Supine to Sit;Sit to Supine     Supine to sit: Max assist;HOB elevated Sit to supine: Mod assist;Max assist;HOB elevated   General bed mobility comments: OT attempted to facilitate log roll technique with pt, but ultimately becomes sup<>sit d/t pt with difficulty sequencing task with OT's cues, HOB left elevated for this transition to assist with prevention of twisting lumbar spine  Transfers                 General transfer comment: unable to assess transfers at this time, pt with limited sitting tolerance related to pain, requests to return to lying in bed shortly after transitioning to sitting.    Balance Overall balance assessment: Needs assistance Sitting-balance support: Bilateral upper extremity supported;Feet supported Sitting balance-Leahy Scale: Poor Sitting balance - Comments: Requires MOD A to sustain static sit       Standing balance comment: unable to assess on OT evaluation                           ADL either performed  or assessed with clinical judgement   ADL Overall ADL's : Needs assistance/impaired                                       General ADL Comments: Pt functionally limited by low-back pain, as well as decreased activity tolerance and generalized weakness. Pt requires setup for UB ADLs at bed level with HOB elevated ~60-70  degrees, MAX A for LB ADLs at bed level, MOD/MAX A for sidelying<>sit, MOD A for static sitting balance, does not tolerate sitting long enough to assess seated ADLs performance at this time.     Vision Baseline Vision/History: Wears glasses Wears Glasses: Reading only Patient Visual Report: No change from baseline       Perception     Praxis      Pertinent Vitals/Pain Pain Assessment: 0-10 Pain Score: 6  Pain Location: no hurt while at rest in bed, but pt c/o pain at 6/10 as soon as he participates in sup to sit with OT. difficulty describing pain, states "feels dead" and "it's agony" Pain Intervention(s): Limited activity within patient's tolerance;Monitored during session;Repositioned     Hand Dominance Right   Extremity/Trunk Assessment Upper Extremity Assessment Upper Extremity Assessment: Generalized weakness(shld 3+/5, elbow & grip 4-/5)   Lower Extremity Assessment Lower Extremity Assessment: Defer to PT evaluation;Generalized weakness       Communication Communication Communication: No difficulties   Cognition Arousal/Alertness: Awake/alert Behavior During Therapy: WFL for tasks assessed/performed Overall Cognitive Status: No family/caregiver present to determine baseline cognitive functioning                                 General Comments: pt A&O x2-self and location. Unaware of most temporal concepts (does state correct year, somewhat uncertainly), does state that Trump is no longer president, but can't remember current POTUS. Pt appropriate conversationally and able to follow commands.   General Comments       Exercises Other Exercises Other Exercises: OT facilitaes education with pt re: role of OT in acute setting. Pt with MIN/MOD reception of education detected. Other Exercises: OT facilitates education with pt re: importance of OOB activity to encourage pt participation in therapy this date and pt agreeable.   Shoulder Instructions       Home Living Family/patient expects to be discharged to:: Private residence(pt presented from [facility], but pt and dtr are working with CM on potential d/c home with hospice versus facility.) Living Arrangements: Children Available Help at Discharge: Family;Available PRN/intermittently(pt reports that dtr is a techer and is available "some of the time") Type of Home: House Home Access: Stairs to enter [employer] of Steps: 6 in front per chart, 1 step in backentrance per pt Entrance Stairs-Rails: Right;Left;Can reach both Home Layout: Two level;Able to live on main level with bedroom/bathroom Alternate Level Stairs-Number of Steps: pt reports never going up to his second floor. Unsure efficacy of pt's reporting, chart review suggests pt lives in 1 level home.   Bathroom Shower/Tub: Advertising copywriter: Yes   Home Equipment: Clinical cytogeneticist - 2 wheels;Cane - single point   Additional Comments: Pt is poor historian with some confusion. Pt reports that his daughter "lives with me some".      Prior Functioning/Environment Level of Independence: Needs assistance  Gait / Transfers Assistance Needed: Pt  reports ambulating in home with RW.  Endorses >5 falls in a year ADL's / Homemaking Assistance Needed: Pt reports that his daughter helps with bathing & dressing BADLs as well as meals, meds, and transportation (or her kids sometimes transport pt) IADLs.   Comments: Per chart, Limited household ambulation with RW; Ind with most ADLs, daughter provides daily check in and assist with groceries, laundry, errands.  Daughter, [name], states that he's fallen twice in the house in the last 6 months.        OT Problem List: Decreased strength;Decreased coordination;Pain;Decreased activity tolerance;Decreased safety awareness;Decreased knowledge of use of DME or AE;Impaired balance (sitting and/or standing);Decreased  cognition;Decreased knowledge of precautions      OT Treatment/Interventions: Self-care/ADL training;Therapeutic exercise;Therapeutic activities;DME and/or AE instruction;Patient/family education;Balance training    OT Goals(Current goals can be found in the care plan section) Acute Rehab OT Goals Patient Stated Goal: to return home and not have to always be in the hospital OT Goal Formulation: With patient Time For Goal Achievement: 03/20/19 Potential to Achieve Goals: Good  OT Frequency: Min 2X/week   Barriers to D/C: Inaccessible home environment          Co-evaluation              AM-PAC OT "6 Clicks" Daily Activity     Outcome Measure Help from another person eating meals?: A Little Help from another person taking care of personal grooming?: A Little Help from another person toileting, which includes using toliet, bedpan, or urinal?: A Lot Help from another person bathing (including washing, rinsing, drying)?: A Lot Help from another person to put on and taking off regular upper body clothing?: A Little Help from another person to put on and taking off regular lower body clothing?: A Lot 6 Click Score: 15   End of Session Nurse Communication: Mobility status  Activity Tolerance: Patient limited by fatigue;Patient limited by pain Patient left: in bed;with call bell/phone within reach;with bed alarm set  OT Visit Diagnosis: Other abnormalities of gait and mobility (R26.89);Muscle weakness (generalized) (M62.81);Pain Pain - part of body: (low back)                Time: 1336-1401 OT Time Calculation (min): 25 min Charges:  OT General Charges $OT Visit: 1 Visit OT Evaluation $OT Eval Moderate Complexity: 1 Mod OT Treatments $Self Care/Home Management : 8-22 mins  Gerrianne Scale, MS, OTR/L ascom [phone] 03/06/19, 2:57 PM

## 2019-03-06 NOTE — Evaluation (Signed)
Physical Therapy Evaluation Patient Details Name: Arthur [name] MRN: [id number] DOB: 04/29/1933 Today's Date: 03/06/2019   History of Present Illness  Pt is an 86 y/o male with PMH including anemia, CHF, CKD, COPD, DM, 2 L home O2, HTN, L2 and L3 kyphoplasty, dyspnea when active. Pt underwent L2 repeat kyphoplasty 02/18/19 secondary closed wedge fx of L2. Pt presented to ED with abdominal pain on 03/04/2019. Pt found to have pleural effusion now s/p thorcentesis on 03/05/19 and symptomatic anemia, now s/p 2 transfusions, and stool guaiac pending.    Clinical Impression  Pt pleasant and motivated to participate with PT  but ultimately was limited by back pain during session.  Pt required Mod A with bed mobility tasks with log roll training provided for pain control.  Pt leaned laterally to the R in sitting at the EOB that the pt reported was due to back pain.  Pt required min A to stand from an elevated EOB but was fairly steady once in standing but with standing tolerance again limited by back pain.  Pt was able to take several small steps with mod lean on the RW prior to requiring to return to sitting.  Pt will benefit from PT services in a SNF setting upon discharge to safely address deficits listed in patient problem list for decreased caregiver assistance and eventual return to PLOF.       Follow Up Recommendations SNF    Equipment Recommendations  None recommended by PT    Recommendations for Other Services       Precautions / Restrictions Precautions Precautions: Fall;Back Precaution Comments: s/p L2 kyphoplasty on 2/16 Restrictions Weight Bearing Restrictions: No      Mobility  Bed Mobility Overal bed mobility: Needs Assistance Bed Mobility: Rolling;Sidelying to Sit Rolling: Mod assist Sidelying to sit: Mod assist   General bed mobility comments: Mod A with log rolling from supine to sit for BLE and trunk control  Transfers Overall transfer level: Needs assistance Equipment  used: Rolling walker (2 wheeled) Transfers: Sit to/from Stand Sit to Stand: Min assist;From elevated surface         General transfer comment: Pt able to stand from an elevated surface x 2 with min-mod A and min verbal cues for sequencing  Ambulation/Gait Ambulation/Gait assistance: Min guard Gait Distance (Feet): 3 Feet Assistive device: Rolling walker (2 wheeled) Gait Pattern/deviations: Step-through pattern;Decreased step length - right;Decreased step length - left;Trunk flexed Gait velocity: decreased   General Gait Details: Pt able to take several smal steps to the recliner with mod lean on the RW but was steady without LOB and reported being limited primarily by back pain  Stairs            Wheelchair Mobility    Modified Rankin (Stroke Patients Only)       Balance Overall balance assessment: Needs assistance Sitting-balance support: Bilateral upper extremity supported;Feet supported Sitting balance-Leahy Scale: Poor Sitting balance - Comments: Min to Mod A to maintain upright sitting position secondary to back pain per pt Postural control: Right lateral lean Standing balance support: Single extremity supported Standing balance-Leahy Scale: Fair Standing balance comment: Mod lean on the RW for support                             Pertinent Vitals/Pain Pain Assessment: 0-10 Pain Score: 7  Pain Location: 0/10 pain at rest, 7/10 back pain with mobility Pain Descriptors / Indicators: Sore;Aching Pain Intervention(s): Premedicated  before session;Monitored during session    [place] expects to be discharged to:: Private residence Living Arrangements: Children Available Help at Discharge: Family;Available PRN/intermittently Type of Home: House Home Access: Stairs to enter Entrance Stairs-Rails: Right;Left;Can reach both Entrance Stairs-Number of Steps: 6 in front per chart, 1 step in backentrance per pt Home Layout: Two level;Able  to live on main level with bedroom/bathroom Home Equipment: Shower seat;Walker - 2 wheels;Cane - single point Additional Comments: Pt is poor historian with some confusion and difficulty with details regarding home living situation and PLOF; history obtained from combination of pt and chart review    Prior Function Level of Independence: Needs assistance   Gait / Transfers Assistance Needed: Pt reports ambulating in home with RW but unsure of distances  ADL's / Homemaking Assistance Needed: Pt reports that his daughter helps with ADLs  Comments: Per chart, Limited household ambulation with RW; Ind with most ADLs, daughter provides daily check in and assist with groceries, laundry, errands.  Daughter, [name], states that he's fallen twice in the house in the last 6 months.     Hand Dominance   Dominant Hand: Right    Extremity/Trunk Assessment   Upper Extremity Assessment Upper Extremity Assessment: Defer to OT evaluation;Generalized weakness    Lower Extremity Assessment Lower Extremity Assessment: Generalized weakness       Communication   Communication: No difficulties  Cognition Arousal/Alertness: Awake/alert Behavior During Therapy: WFL for tasks assessed/performed Overall Cognitive Status: No family/caregiver present to determine baseline cognitive functioning                                 General Comments: pt A&O x2-self and location. Unaware of most temporal concepts (does state correct year, somewhat uncertainly), does state that Trump is no longer president, but can't remember current POTUS. Pt appropriate conversationally and able to follow commands.      General Comments      Exercises Total Joint Exercises Ankle Circles/Pumps: Strengthening;Both;10 reps Quad Sets: Strengthening;Both;10 reps Gluteal Sets: Strengthening;Both;10 reps Heel Slides: AAROM;Both;10 reps Hip ABduction/ADduction: AAROM;Both;10 reps Straight Leg Raises: AAROM;Both;10  reps Long Arc Quad: Strengthening;Both;10 reps Knee Flexion: Both;Strengthening;10 reps Marching in Standing: AROM;Both;5 reps;Standing Other Exercises Other Exercises: OT facilitaes education with pt re: role of OT in acute setting. Pt with MIN/MOD reception of education detected. Other Exercises: OT facilitates education with pt re: importance of OOB activity to encourage pt participation in therapy this date and pt agreeable.   Assessment/Plan    PT Assessment Patient needs continued PT services  PT Problem List Decreased strength;Decreased activity tolerance;Decreased balance;Decreased mobility;Decreased knowledge of use of DME;Pain       PT Treatment Interventions DME instruction;Gait training;Stair training;Functional mobility training;Therapeutic activities;Therapeutic exercise;Balance training;Patient/family education    PT Goals (Current goals can be found in the Care Plan section)  Acute Rehab PT Goals Patient Stated Goal: To get stronger PT Goal Formulation: With patient Time For Goal Achievement: 03/19/19 Potential to Achieve Goals: Fair    Frequency Min 2X/week   Barriers to discharge Decreased caregiver support;Inaccessible home environment      Co-evaluation               AM-PAC PT "6 Clicks" Mobility  Outcome Measure Help needed turning from your back to your side while in a flat bed without using bedrails?: A Lot Help needed moving from lying on your back to sitting on the side of a  flat bed without using bedrails?: A Lot Help needed moving to and from a bed to a chair (including a wheelchair)?: A Lot Help needed standing up from a chair using your arms (e.g., wheelchair or bedside chair)?: A Lot Help needed to walk in hospital room?: A Lot Help needed climbing 3-5 steps with a railing? : Total 6 Click Score: 11    End of Session Equipment Utilized During Treatment: Gait belt Activity Tolerance: Patient limited by pain Patient left: in chair;with  call bell/phone within reach;with chair alarm set Nurse Communication: Mobility status PT Visit Diagnosis: Muscle weakness (generalized) (M62.81);Difficulty in walking, not elsewhere classified (R26.2);Pain Pain - part of body: (Low back pain)    Time: 8338-2505 PT Time Calculation (min) (ACUTE ONLY): 27 min   Charges:   PT Evaluation $PT Eval Moderate Complexity: 1 Mod PT Treatments $Therapeutic Exercise: 8-22 mins        D. Royetta Asal PT, DPT 03/06/19, 4:13 PM

## 2019-03-07 ENCOUNTER — Other Ambulatory Visit: Payer: Self-pay

## 2019-03-07 DIAGNOSIS — E785 Hyperlipidemia, unspecified: Secondary | ICD-10-CM

## 2019-03-07 DIAGNOSIS — J449 Chronic obstructive pulmonary disease, unspecified: Secondary | ICD-10-CM

## 2019-03-07 LAB — TRIGLYCERIDES, BODY FLUIDS: Triglycerides, Fluid: 13 mg/dL

## 2019-03-07 LAB — HEMOGLOBIN: Hemoglobin: 7.6 g/dL — ABNORMAL LOW (ref 13.0–17.0)

## 2019-03-07 LAB — BASIC METABOLIC PANEL
Anion gap: 8 (ref 5–15)
BUN: 34 mg/dL — ABNORMAL HIGH (ref 8–23)
CO2: 29 mmol/L (ref 22–32)
Calcium: 8.1 mg/dL — ABNORMAL LOW (ref 8.9–10.3)
Chloride: 95 mmol/L — ABNORMAL LOW (ref 98–111)
Creatinine, Ser: 1.94 mg/dL — ABNORMAL HIGH (ref 0.61–1.24)
GFR calc Af Amer: 36 mL/min — ABNORMAL LOW (ref >60)
GFR calc non Af Amer: 31 mL/min — ABNORMAL LOW (ref >60)
Glucose, Bld: 151 mg/dL — ABNORMAL HIGH (ref 70–99)
Potassium: 3.8 mmol/L (ref 3.5–5.1)
Sodium: 132 mmol/L — ABNORMAL LOW (ref 135–145)

## 2019-03-07 LAB — GLUCOSE, CAPILLARY
Glucose-Capillary: 147 mg/dL — ABNORMAL HIGH (ref 70–99)
Glucose-Capillary: 155 mg/dL — ABNORMAL HIGH (ref 70–99)
Glucose-Capillary: 225 mg/dL — ABNORMAL HIGH (ref 70–99)
Glucose-Capillary: 210 mg/dL — ABNORMAL HIGH (ref 70–99)

## 2019-03-07 LAB — PH, BODY FLUID: pH, Body Fluid: 7.6

## 2019-03-07 MED ORDER — HYDROCODONE-ACETAMINOPHEN 5-325 MG PO TABS: 1.0000 | ORAL_TABLET | Freq: Four times a day (QID) | ORAL | 0 refills | Status: DC | PRN

## 2019-03-07 MED ORDER — LEVEMIR 100 UNIT/ML ~~LOC~~ SOLN: 4.0000 [IU] | Freq: Every day | SUBCUTANEOUS | 0 refills | Status: AC

## 2019-03-07 MED ORDER — PANTOPRAZOLE SODIUM 40 MG PO TBEC: 40.0000 mg | DELAYED_RELEASE_TABLET | Freq: Every day | ORAL | Status: DC

## 2019-03-07 MED ORDER — INSULIN ASPART 100 UNIT/ML ~~LOC~~ SOLN: SUBCUTANEOUS | 11 refills | Status: AC

## 2019-03-07 MED ORDER — ACETAMINOPHEN 325 MG PO TABS: 650.0000 mg | ORAL_TABLET | Freq: Four times a day (QID) | ORAL | Status: AC | PRN

## 2019-03-07 NOTE — Care Management Important Message (Signed)
Important Message  Patient Details  Name: KILLIAN SCHWER MRN: [id number] Date of Birth: 11-21-1933   Medicare Important Message Given:  Yes     Dannette Barbara 03/07/2019, 1:07 PM

## 2019-03-07 NOTE — Patient Outreach (Signed)
[facility]) Care Management  03/07/2019  HAWKINS SEAMAN 1933/03/29 [id number]   Referral received of patient discharged from [facility] on 03-04-19.  Patient currently hospitalized.  No outreach needed at this time.  Plan: RN CM will close case.  Jone Baseman, RN, MSN [place] Management Care Management Coordinator Direct Line [phone] Cell [phone] Toll Free: [phone]  Fax: [phone]

## 2019-03-07 NOTE — Progress Notes (Signed)
Patient ID: Arthur [name], male   DOB: Dec 06, 1933, 86 y.o.   MRN: [id number] [facility] PROGRESS NOTE  ORAL REMACHE BPZ:[id number] DOB: 01/10/1933 DOA: 03/04/2019 PCP: Baxter Hire, MD  HPI/Subjective: Patient feeling better today.  Interested in getting out of the hospital.  Always has a little shortness of breath but feeling better than when he came in.  Objective: Vitals:   03/07/19 0150 03/07/19 0547  BP:  (!) 142/47  Pulse: 75 71  Resp:  18  Temp:  99.2 F (37.3 C)  SpO2: 97% 95%    Intake/Output Summary (Last 24 hours) at 03/07/2019 1429 Last data filed at 03/07/2019 0900 Gross per 24 hour  Intake 530 ml  Output 450 ml  Net 80 ml   Filed Weights   03/04/19 1210 03/06/19 0500 03/07/19 0500  Weight: 93 kg 88.4 kg 89.1 kg    ROS: Review of Systems  Constitutional: Positive for malaise/fatigue.  Eyes: Negative for blurred vision.  Respiratory: Positive for shortness of breath. Negative for cough.   Cardiovascular: Negative for chest pain.  Gastrointestinal: Negative for abdominal pain, constipation and diarrhea.  Genitourinary: Negative for dysuria.  Musculoskeletal: Negative for joint pain.  Neurological: Negative for dizziness.   Exam: Physical Exam  Constitutional: He is oriented to person, place, and time.  HENT:  Nose: No mucosal edema.  Mouth/Throat: No oropharyngeal exudate or posterior oropharyngeal edema.  Eyes: Conjunctivae and lids are normal.  Neck: Carotid bruit is not present.  Cardiovascular: S1 normal and S2 normal. Exam reveals no gallop.  No murmur heard. Respiratory: No respiratory distress. He has decreased breath sounds in the right lower field and the left lower field. He has no wheezes. He has no rhonchi. He has no rales.  GI: Soft. Bowel sounds are normal. There is no abdominal tenderness.  Musculoskeletal:     Right ankle: Swelling present.     Left ankle: Swelling present.  Lymphadenopathy:    He has no cervical adenopathy.   Neurological: He is alert and oriented to person, place, and time. No cranial nerve deficit.  Skin: Skin is warm. No rash noted. Nails show no clubbing.  Psychiatric: He has a normal mood and affect.      Data Reviewed: Basic Metabolic Panel: Recent Labs  Lab 03/04/19 1220 03/05/19 0820 03/06/19 0542 03/07/19 0722  NA 133* 133* 131* 132*  K 3.3* 3.5 3.3* 3.8  CL 95* 94* 92* 95*  CO2 28 27 29 29   GLUCOSE 161* 104* 135* 151*  BUN 18 18 29* 34*  CREATININE 1.54* 1.57* 1.84* 1.94*  CALCIUM 8.3* 8.2* 7.8* 8.1*   Liver Function Tests: Recent Labs  Lab 03/04/19 1220  AST 9*  ALT 8  ALKPHOS 73  BILITOT 0.8  PROT 6.9  ALBUMIN 3.3*   Recent Labs  Lab 03/04/19 1220  LIPASE 24   CBC: Recent Labs  Lab 03/04/19 1220 03/05/19 0331 03/05/19 1701 03/06/19 0542 03/07/19 0722  WBC 7.6 6.5  --  8.5  --   NEUTROABS 5.4  --   --   --   --   HGB 7.4* 5.9* 7.1* 7.7* 7.6*  HCT 23.3* 18.3* 22.3* 23.7*  --   MCV 91.4 91.5  --  88.8  --   PLT 366 274  --  288  --    BNP (last 3 results) Recent Labs    07/06/18 1613  BNP 132.0*     CBG: Recent Labs  Lab 03/06/19 1144 03/06/19 1646 03/06/19  2215 03/07/19 0733 03/07/19 1211  GLUCAP 247* 185* 133* 147* 155*    Recent Results (from the past 240 hour(s))  SARS CORONAVIRUS 2 (TAT 6-24 HRS) Nasopharyngeal Nasopharyngeal Swab     Status: None   Collection Time: 03/04/19  3:40 PM   Specimen: Nasopharyngeal Swab  Result Value Ref Range Status   SARS Coronavirus 2 NEGATIVE NEGATIVE Final    Comment: (NOTE) SARS-CoV-2 target nucleic acids are NOT DETECTED. The SARS-CoV-2 RNA is generally detectable in upper and lower respiratory specimens during the acute phase of infection. Negative results do not preclude SARS-CoV-2 infection, do not rule out co-infections with other pathogens, and should not be used as the sole basis for treatment or other patient management decisions. Negative results must be combined with clinical  observations, patient history, and epidemiological information. The expected result is Negative. Fact Sheet for Patients: SugarRoll.be Fact Sheet for Healthcare Providers: https://www.woods-mathews.com/ This test is not yet approved or cleared by the [place] FDA and  has been authorized for detection and/or diagnosis of SARS-CoV-2 by FDA under an Emergency Use Authorization (EUA). This EUA will remain  in effect (meaning this test can be used) for the duration of the COVID-19 declaration under Section 56 4(b)(1) of the Act, 21 U.S.C. section 360bbb-3(b)(1), unless the authorization is terminated or revoked sooner. Performed at [facility], [place] [address] [place], [place] [address]   Body fluid culture     Status: None (Preliminary result)   Collection Time: 03/05/19 10:12 AM   Specimen: PATH Cytology Pleural fluid  Result Value Ref Range Status   Specimen Description   Final    PLEURAL Performed at [facility], [address] [place], [place] [address]    Special Requests   Final    NONE Performed at [facility], [address] [place], [place] [address]    Gram Stain   Final    RARE WBC PRESENT, PREDOMINANTLY MONONUCLEAR NO ORGANISMS SEEN    Culture   Final    NO GROWTH 2 DAYS Performed at [facility], [place] [address], [place], [place] [address]    Report Status PENDING  Incomplete     Studies: No results found.  Scheduled Meds: . amLODipine  2.5 mg Oral Daily  . ascorbic acid  500 mg Oral QHS  . docusate sodium  100 mg Oral BID  . insulin aspart  0-5 Units Subcutaneous QHS  . insulin aspart  0-9 Units Subcutaneous TID WC  . metoCLOPramide  5 mg Oral TID AC & HS  . polyethylene glycol  17 g Oral QODAY  . potassium chloride  10 mEq Oral QPM  . pravastatin  10 mg Oral QHS  . sodium chloride flush  3 mL Intravenous Q12H  . tamsulosin  0.4 mg Oral Daily  . tiotropium  18 mcg  Inhalation Daily  . torsemide  20 mg Oral Q M,W,F   Continuous Infusions: . sodium chloride      Assessment/Plan:  1. Symptomatic anemia with acute blood loss anemia.  Hemoglobin dropping from 7.4 down to 5.9.  Patient responded to 2 units of packed red blood cells.  Hemoglobin up to 7.6.  Received Aranesp while here.  Follow-up with [name] as outpatient. 2. Right pleural effusion status post thoracentesis 03/05/2019 and 600 mL taken off.  3. Constipation.   Resolved with lactulose 4. Chronic kidney disease stage IIIb 5. Acute on chronic respiratory failure with hypoxia.  Patient was off oxygen  this morning and states that he wears it as needed.  Continue to monitor. 6. Type 2 diabetes mellitus with chronic kidney disease stage IIIb.  Last hemoglobin A1c 6.7.  Patient currently on sliding scale insulin here in the hospital. 7. Essential hypertension on amlodipine 8. GERD on Protonix 9. COPD.  Continue inhalers 10. BPH on Flomax 11. Hyperlipidemia unspecified on Pravachol 12. Hypokalemia.  Replaced. 13. Weakness.  Physical therapy recommended rehab.  Patient will go back to [facility] once insurance authorizes.  Code Status:     Code Status Orders  (From admission, onward)         Start     Ordered   03/04/19 1544  Do not attempt resuscitation (DNR)  Continuous    Question Answer Comment  In the event of cardiac or respiratory ARREST Do not call a "code blue"   In the event of cardiac or respiratory ARREST Do not perform Intubation, CPR, defibrillation or ACLS   In the event of cardiac or respiratory ARREST Use medication by any route, position, wound care, and other measures to relive pain and suffering. May use oxygen, suction and manual treatment of airway obstruction as needed for comfort.      03/04/19 1543        Code Status History    Date Active Date Inactive Code Status Order ID Comments User Context   02/18/2019 1832 02/20/2019 2120 DNR [id number]  [name],  MD Inpatient   12/24/2018 1730 12/24/2018 2132 Full Code [id number]  [name], MD Inpatient   10/27/2018 2342 11/05/2018 1711 DNR [id number]  Sidney Ace, Arvella Merles, MD ED   10/27/2018 2342 10/27/2018 2342 Full Code [id number]  Mansy, Arvella Merles, MD ED   08/07/2018 2329 08/09/2018 1634 DNR [id number]  Lance Coon, MD Inpatient   07/25/2018 0955 07/26/2018 1908 DNR [id number]  Bettey Costa, MD Inpatient   07/25/2018 0138 07/25/2018 0954 DNR [id number]  Lance Coon, MD Inpatient   07/06/2018 2054 07/10/2018 1959 DNR [id number]  Henreitta Leber, MD Inpatient   06/27/2018 1150 07/05/2018 1757 DNR [id number]  Lang Snow, NP Inpatient   06/09/2018 1350 06/14/2018 1730 DNR [id number]  Lang Snow, NP ED   05/23/2018 0919 05/28/2018 2024 DNR [id number]  Demetrios Loll, MD Inpatient   05/23/2018 0104 05/23/2018 0918 Full Code [id number]  Mayer Camel, NP Inpatient   Advance Care Planning Activity     Family Communication: Spoke with patient's daughter on the phone and given update Disposition Plan: Medically stable to discharge to rehab.  Awaiting insurance authorization.  Consultants:  Hematology  Time spent: 35 minutes  [place]

## 2019-03-07 NOTE — Progress Notes (Signed)
Patient was 97% on room air. Will continue to evaluate.  Arthur [name]

## 2019-03-07 NOTE — Discharge Summary (Signed)
Arthur [name] at [place] NAME: Arthur [name]    MR#:  [id number]  DATE OF BIRTH:  1933/08/05  DATE OF ADMISSION:  03/04/2019 ADMITTING PHYSICIAN: Max Sane, MD  DATE OF DISCHARGE: 03/08/2019  PRIMARY CARE PHYSICIAN: Baxter Hire, MD    ADMISSION DIAGNOSIS:  Shortness of breath [R06.02] Pleural effusion [J90] Aspiration pneumonia of right lower lobe, unspecified aspiration pneumonia type ([place]) [J69.0]  DISCHARGE DIAGNOSIS:  Active Problems:   Pleural effusion   DNR (do not resuscitate)   Generalized abdominal pain   Gastroesophageal reflux disease without esophagitis   Status post thoracentesis   Constipation   SECONDARY DIAGNOSIS:   Past Medical History:  Diagnosis Date  . Anemia of chronic kidney failure 08/21/2018  . Arthritis    lower back  . CHF (congestive heart failure) ([place])   . CKD (chronic kidney disease), stage III   . COPD (chronic obstructive pulmonary disease) ([place])   . Diabetes mellitus without complication ([place])    type 2  . Dyspnea    when active -wears oxygen 2L via [place]  . History of blood transfusion   . Hyperlipidemia   . Hypertension   . Requires continuous at home supplemental oxygen    2L via [place]:   1.  Symptomatic anemia.  Acute blood loss anemia patient received 2 units of packed red blood cells.  Hemoglobin was as low as 5.9.  Hemoglobin is stable 7.6 after transfusions.  Patient received a dose of Aranesp.  Follow-up with [name] as outpatient. 2.  Right pleural effusion status post thoracentesis on 03/05/2019.  600 mL taken off.  Patient is breathing better than when he came in. 3.  Constipation.  Patient had large bowel movement last night after lactulose given. 4.  Chronic kidney disease stage IIIb 5.  Acute on chronic respiratory failure with hypoxia.  Each time I see the patient he has his oxygen off. 6.  Type 2 diabetes mellitus with chronic kidney disease stage IIIb.  Last  hemoglobin A1c 6.7.  Patient on sliding scale insulin while here.  Decrease his dose of Levemir to 4 units nightly upon discharge. 7.  Essential hypertension on amlodipine 8.  GERD on Protonix 9.  COPD.  Continue inhalers 10.  BPH on Flomax 11.  Hyperlipidemia unspecified on Pravachol 12.  Hypokalemia replaced during the hospital course 13.  Weakness.  Physical therapy recommends rehab.  We are currently awaiting insurance authorization to go back to rehab. 14.  Chronic diastolic congestive heart failure.  No signs of heart failure on this hospital stay 15.  Palliative care to follow at facility.  Patient is a DNR.  DISCHARGE CONDITIONS:  Satisfactory  CONSULTS OBTAINED:  Treatment Team:  [name], MD  DRUG ALLERGIES:   Allergies  Allergen Reactions  . Codeine Other (See Comments)    Constipation    DISCHARGE MEDICATIONS:   Allergies as of 03/07/2019      Reactions   Codeine Other (See Comments)   Constipation      Medication List    TAKE these medications   acetaminophen 325 MG tablet Commonly known as: TYLENOL Take 2 tablets (650 mg total) by mouth every 6 (six) hours as needed for mild pain (or Fever >/= 101).   albuterol 108 (90 Base) MCG/ACT inhaler Commonly known as: VENTOLIN HFA Inhale 2 puffs into the lungs every 6 (six) hours as needed for wheezing or shortness of breath.  amLODipine 2.5 MG tablet Commonly known as: NORVASC Take 2.5 mg by mouth daily.   ascorbic acid 500 MG tablet Commonly known as: VITAMIN C Take 500 mg by mouth at bedtime.   docusate sodium 100 MG capsule Commonly known as: COLACE Take 1 capsule (100 mg total) by mouth 2 (two) times daily.   HYDROcodone-acetaminophen 5-325 MG tablet Commonly known as: Norco Take 1 tablet by mouth every 6 (six) hours as needed for moderate pain or severe pain.   insulin aspart 100 UNIT/ML injection Commonly known as: novoLOG 4 units prior to meals if sugars above 200 What changed:   how  much to take  how to take this  when to take this  additional instructions   Levemir 100 UNIT/ML injection Generic drug: insulin detemir Inject 0.04 mLs (4 Units total) into the skin at bedtime. What changed:   how much to take  when to take this   metoCLOPramide 5 MG tablet Commonly known as: REGLAN Take 5 mg by mouth 4 (four) times daily -  before meals and at bedtime.   pantoprazole 40 MG tablet Commonly known as: Protonix Take 1 tablet (40 mg total) by mouth daily.   polyethylene glycol powder 17 GM/SCOOP powder Commonly known as: GLYCOLAX/MIRALAX Take 17 g by mouth every other day.   potassium chloride 10 MEQ tablet Commonly known as: KLOR-CON Take 10 mEq by mouth every evening.   pravastatin 10 MG tablet Commonly known as: PRAVACHOL Take 10 mg by mouth at bedtime.   promethazine 25 MG tablet Commonly known as: PHENERGAN Take 25 mg by mouth every 4 (four) hours as needed for nausea or vomiting.   tamsulosin 0.4 MG Caps capsule Commonly known as: FLOMAX Take 0.4 mg by mouth daily.   tiotropium 18 MCG inhalation capsule Commonly known as: SPIRIVA Place 18 mcg into inhaler and inhale daily.   torsemide 20 MG tablet Commonly known as: DEMADEX Take 20 mg by mouth every Monday, Wednesday, and Friday.        DISCHARGE INSTRUCTIONS:   Follow-up with team at rehab 1 day  If you experience worsening of your admission symptoms, develop shortness of breath, life threatening emergency, suicidal or homicidal thoughts you must seek medical attention immediately by calling 911 or calling your MD immediately  if symptoms less severe.  You Must read complete instructions/literature along with all the possible adverse reactions/side effects for all the Medicines you take and that have been prescribed to you. Take any new Medicines after you have completely understood and accept all the possible adverse reactions/side effects.   Please note  You were cared for by a  hospitalist during your hospital stay. If you have any questions about your discharge medications or the care you received while you were in the hospital after you are discharged, you can call the unit and asked to speak with the hospitalist on call if the hospitalist that took care of you is not available. Once you are discharged, your primary care physician will handle any further medical issues. Please note that NO REFILLS for any discharge medications will be authorized once you are discharged, as it is imperative that you return to your primary care physician (or establish a relationship with a primary care physician if you do not have one) for your aftercare needs so that they can reassess your need for medications and monitor your lab values.    Today   CHIEF COMPLAINT:   Chief Complaint  Patient presents with  .  Abdominal Pain    HISTORY OF PRESENT ILLNESS:  Arthur [name]  is a 86 y.o. male came in initially with abdominal pain   VITAL SIGNS:  Blood pressure (!) 142/47, pulse 71, temperature 99.2 F (37.3 C), temperature source Oral, resp. rate 18, height 5\' 8"  (1.727 m), weight 89.1 kg, SpO2 95 %.  I/O:    Intake/Output Summary (Last 24 hours) at 03/07/2019 1417 Last data filed at 03/07/2019 0900 Gross per 24 hour  Intake 530 ml  Output 450 ml  Net 80 ml    PHYSICAL EXAMINATION:  GENERAL:  86 y.o.-year-old patient lying in the bed with no acute distress.  EYES: Pupils equal, round, reactive to light and accommodation. No scleral icterus. Extraocular m HEENT: Head atraumatic, normocephalic. Oropharynx and nasopharynx clear.  NECK:  Supple LUNGS: Decreased breath sounds bilaterally, no wheezing, rales,rhonchi or crepitation. No use of accessory muscles of respiration.  CARDIOVASCULAR: S1, S2 normal. No murmurs, rubs, or gallops.  ABDOMEN: Soft, non-tender, non-distended. EXTREMITIES: Trace pedal edema.  NEUROLOGIC: Cranial nerves II through XII are intact. PSYCHIATRIC: The  patient is alert and oriented x 3.  SKIN: No obvious rash, lesion, or ulcer.   DATA REVIEW:   CBC Recent Labs  Lab 03/06/19 0542 03/06/19 0542 03/07/19 0722  WBC 8.5  --   --   HGB 7.7*   < > 7.6*  HCT 23.7*  --   --   PLT 288  --   --    < > = values in this interval not displayed.    Chemistries  Recent Labs  Lab 03/04/19 1220 03/05/19 0820 03/07/19 0722  NA 133*   < > 132*  K 3.3*   < > 3.8  CL 95*   < > 95*  CO2 28   < > 29  GLUCOSE 161*   < > 151*  BUN 18   < > 34*  CREATININE 1.54*   < > 1.94*  CALCIUM 8.3*   < > 8.1*  AST 9*  --   --   ALT 8  --   --   ALKPHOS 73  --   --   BILITOT 0.8  --   --    < > = values in this interval not displayed.    Microbiology Results  Results for orders placed or performed during the hospital encounter of 03/04/19  SARS CORONAVIRUS 2 (TAT 6-24 HRS) Nasopharyngeal Nasopharyngeal Swab     Status: None   Collection Time: 03/04/19  3:40 PM   Specimen: Nasopharyngeal Swab  Result Value Ref Range Status   SARS Coronavirus 2 NEGATIVE NEGATIVE Final    Comment: (NOTE) SARS-CoV-2 target nucleic acids are NOT DETECTED. The SARS-CoV-2 RNA is generally detectable in upper and lower respiratory specimens during the acute phase of infection. Negative results do not preclude SARS-CoV-2 infection, do not rule out co-infections with other pathogens, and should not be used as the sole basis for treatment or other patient management decisions. Negative results must be combined with clinical observations, patient history, and epidemiological information. The expected result is Negative. Fact Sheet for Patients: SugarRoll.be Fact Sheet for Healthcare Providers: https://www.woods-mathews.com/ This test is not yet approved or cleared by the [place] FDA and  has been authorized for detection and/or diagnosis of SARS-CoV-2 by FDA under an Emergency Use Authorization (EUA). This EUA will remain   in effect (meaning this test can be used) for the duration of the COVID-19 declaration under Section 56 4(b)(1) of the  Act, 21 U.S.C. section 360bbb-3(b)(1), unless the authorization is terminated or revoked sooner. Performed at [facility], [place] [address] [place], [place] [address]   Body fluid culture     Status: None (Preliminary result)   Collection Time: 03/05/19 10:12 AM   Specimen: PATH Cytology Pleural fluid  Result Value Ref Range Status   Specimen Description   Final    PLEURAL Performed at [facility], [address] [place], [place] [address]    Special Requests   Final    NONE Performed at [facility], [place] [place], [place] [address]    Gram Stain   Final    RARE WBC PRESENT, PREDOMINANTLY MONONUCLEAR NO ORGANISMS SEEN    Culture   Final    NO GROWTH 2 DAYS Performed at [facility], [place] [address], [place], [place] [address]    Report Status PENDING  Incomplete      Management plans discussed with the patient, family and they are in agreement.  CODE STATUS:     Code Status Orders  (From admission, onward)         Start     Ordered   03/04/19 1544  Do not attempt resuscitation (DNR)  Continuous    Question Answer Comment  In the event of cardiac or respiratory ARREST Do not call a "code blue"   In the event of cardiac or respiratory ARREST Do not perform Intubation, CPR, defibrillation or ACLS   In the event of cardiac or respiratory ARREST Use medication by any route, position, wound care, and other measures to relive pain and suffering. May use oxygen, suction and manual treatment of airway obstruction as needed for comfort.      03/04/19 1543        Code Status History    Date Active Date Inactive Code Status Order ID Comments User Context   02/18/2019 1832 02/20/2019 2120 DNR [id number]  [name], MD Inpatient   12/24/2018 1730 12/24/2018 2132 Full Code [id number]  [name], MD Inpatient    10/27/2018 2342 11/05/2018 1711 DNR [id number]  Sidney Ace, Arvella Merles, MD ED   10/27/2018 2342 10/27/2018 2342 Full Code [id number]  Mansy, Arvella Merles, MD ED   08/07/2018 2329 08/09/2018 1634 DNR [id number]  Lance Coon, MD Inpatient   07/25/2018 0955 07/26/2018 1908 DNR [id number]  Bettey Costa, MD Inpatient   07/25/2018 0138 07/25/2018 0954 DNR [id number]  Lance Coon, MD Inpatient   07/06/2018 2054 07/10/2018 1959 DNR [id number]  Henreitta Leber, MD Inpatient   06/27/2018 1150 07/05/2018 1757 DNR [id number]  Lang Snow, NP Inpatient   06/09/2018 1350 06/14/2018 1730 DNR [id number]  Lang Snow, NP ED   05/23/2018 0919 05/28/2018 2024 DNR [id number]  Demetrios Loll, MD Inpatient   05/23/2018 0104 05/23/2018 0918 Full Code [id number]  Mayer Camel, NP Inpatient   Advance Care Planning Activity      TOTAL TIME TAKING CARE OF THIS PATIENT: 35 minutes.    Loletha Grayer M.D on 03/07/2019 at 2:17 PM  Between 7am to 6pm - Pager - [phone]  After 6pm go to [email or web address] - password [email or web address]  [facility]  CC: Primary care physician; Baxter Hire, MD

## 2019-03-07 NOTE — NC FL2 (Signed)
[place] LEVEL OF CARE SCREENING TOOL     IDENTIFICATION  Patient Name: Arthur [name] Birthdate: 1933/05/28 Sex: male Admission Date (Current Location): 03/04/2019  [place] and [place] Number:  Engineering geologist and Address:  [facility], [address], [place], [place] [address]      Provider Number: [id number]  Attending Physician Name and Address:  Loletha Grayer, MD  Relative Name and Phone Number:  [name]- Daughter [phone]    Current Level of Care: Hospital Recommended Level of Care: [place] Prior Approval Number:    Date Approved/Denied:   PASRR Number: [id number]  Discharge Plan: SNF    Current Diagnoses: Patient Active Problem List   Diagnosis Date Noted  . Constipation   . DNR (do not resuscitate) 03/05/2019  . Generalized abdominal pain   . Gastroesophageal reflux disease without esophagitis   . Status post thoracentesis   . Pleural effusion on right 03/04/2019  . S/P kyphoplasty 02/18/2019  . Goals of care, counseling/discussion 01/16/2019  . CKD (chronic kidney disease) stage 4, GFR 15-29 ml/min (HCC) 01/16/2019  . CKD stage 4 due to type 2 diabetes mellitus ([place])   . Closed compression fracture of second lumbar vertebra ([place])   . CKD stage 3 due to type 2 diabetes mellitus ([place])   . Chronic respiratory failure with hypoxia ([place])   . Benign prostatic hyperplasia without lower urinary tract symptoms   . History of anemia due to chronic kidney disease   . Anemia of chronic kidney failure 08/21/2018  . UTI (urinary tract infection) 08/07/2018  . Acute on chronic renal failure ([place]) 08/07/2018  . HLD (hyperlipidemia) 07/24/2018  . Acute on chronic respiratory failure with hypoxia ([place]) 07/06/2018  . Symptomatic anemia 06/27/2018  . GI bleeding 06/09/2018  . Sepsis secondary to UTI ([place]) 05/23/2018  . Cellulitis of right lower extremity 05/22/2018  . Diabetes ([place]) 10/24/2006  .  OBESITY 10/24/2006  . Essential hypertension 10/24/2006  . ALLERGIC RHINITIS 10/24/2006  . COPD (chronic obstructive pulmonary disease) ([place]) 10/24/2006  . TOBACCO ABUSE, HX OF 10/24/2006    Orientation RESPIRATION BLADDER Height & Weight     Self, Time, Situation, Place  Normal Incontinent Weight: 196 lb 6.4 oz (89.1 kg) Height:  5\' 8"  (172.7 cm)  BEHAVIORAL SYMPTOMS/MOOD NEUROLOGICAL BOWEL NUTRITION STATUS  (None)   Incontinent Diet(carb modified diet, thin liquids)  AMBULATORY STATUS COMMUNICATION OF NEEDS Skin   Extensive Assist Verbally Bruising(bruising on R hand)                       Personal Care Assistance Level of Assistance  Bathing, Feeding, Dressing Bathing Assistance: Maximum assistance Feeding assistance: Maximum assistance Dressing Assistance: Maximum assistance     Functional Limitations Info  Sight, Hearing, Speech Sight Info: Adequate Hearing Info: Adequate Speech Info: Adequate    SPECIAL CARE FACTORS FREQUENCY  PT (By licensed PT), OT (By licensed OT)     PT Frequency: 5x/week OT Frequency: 5x/week            Contractures Contractures Info: Not present    Additional Factors Info  Allergies, Code Status Code Status Info: DNR Allergies Info: codeine           Current Medications (03/07/2019):  This is the current hospital active medication list Current Facility-Administered Medications  Medication Dose Route Frequency Provider Last Rate Last Admin  . 0.9 %  sodium chloride infusion  250 mL Intravenous PRN Manuella Ghazi, [place],  MD      . acetaminophen (TYLENOL) tablet 650 mg  650 mg Oral Q6H PRN Max Sane, MD   650 mg at 03/06/19 1415   Or  . acetaminophen (TYLENOL) suppository 650 mg  650 mg Rectal Q6H PRN Max Sane, MD      . albuterol (PROVENTIL) (2.5 MG/3ML) 0.083% nebulizer solution 3 mL  3 mL Inhalation Q6H PRN Max Sane, MD      . amLODipine (NORVASC) tablet 2.5 mg  2.5 mg Oral Daily Max Sane, MD   2.5 mg at 03/07/19 0847  .  ascorbic acid (VITAMIN C) tablet 500 mg  500 mg Oral QHS Max Sane, MD   500 mg at 03/06/19 2217  . bisacodyl (DULCOLAX) EC tablet 5 mg  5 mg Oral Daily PRN Max Sane, MD      . docusate sodium (COLACE) capsule 100 mg  100 mg Oral BID Max Sane, MD   100 mg at 03/07/19 0848  . insulin aspart (novoLOG) injection 0-5 Units  0-5 Units Subcutaneous QHS Manuella Ghazi, [place]ip[place], MD      . insulin aspart (novoLOG) injection 0-9 Units  0-9 Units Subcutaneous TID WC Max Sane, MD   1 Units at 03/07/19 0849  . lactulose (CHRONULAC) 10 GM/15ML solution 30 g  30 g Oral TID Loletha Grayer, MD   30 g at 03/07/19 0846  . metoCLOPramide (REGLAN) tablet 5 mg  5 mg Oral TID AC & HS Max Sane, MD   5 mg at 03/06/19 2221  . ondansetron (ZOFRAN) tablet 4 mg  4 mg Oral Q6H PRN Max Sane, MD       Or  . ondansetron (ZOFRAN) injection 4 mg  4 mg Intravenous Q6H PRN Manuella Ghazi, Vip[place], MD      . polyethylene glycol (MIRALAX / GLYCOLAX) packet 17 g  17 g Oral Magdalene Patricia, Vipul, MD   17 g at 03/07/19 0853  . potassium chloride (KLOR-CON) CR tablet 10 mEq  10 mEq Oral QPM Max Sane, MD   10 mEq at 03/06/19 1706  . pravastatin (PRAVACHOL) tablet 10 mg  10 mg Oral QHS Max Sane, MD   10 mg at 03/06/19 2217  . promethazine (PHENERGAN) tablet 25 mg  25 mg Oral Q4H PRN Manuella Ghazi, Vipul, MD      . sodium chloride flush (NS) 0.9 % injection 3 mL  3 mL Intravenous Q12H Max Sane, MD   3 mL at 03/07/19 0853  . sodium chloride flush (NS) 0.9 % injection 3 mL  3 mL Intravenous PRN Max Sane, MD   3 mL at 03/04/19 1703  . tamsulosin (FLOMAX) capsule 0.4 mg  0.4 mg Oral Daily Max Sane, MD   0.4 mg at 03/07/19 0848  . tiotropium (SPIRIVA) inhalation capsule ([facility] use ONLY) 18 mcg  18 mcg Inhalation Daily Max Sane, MD   18 mcg at 03/07/19 0850  . torsemide (DEMADEX) tablet 20 mg  20 mg Oral Q M,W,F Max Sane, MD   20 mg at 03/07/19 0847  . traZODone (DESYREL) tablet 25 mg  25 mg Oral QHS PRN Max Sane, MD   25 mg at 03/04/19 2214      Discharge Medications: Please see discharge summary for a list of discharge medications.  Relevant Imaging Results:  Relevant Lab Results:   Additional Information SSN: [id number]  [place], LCSW

## 2019-03-07 NOTE — Progress Notes (Signed)
Hematology/Oncology Progress Note [facility] [phone] [phone]  Patient Care Team: Baxter Hire, MD as PCP - General (Internal Medicine) Earlie Server, MD as Consulting Physician (Oncology) Jon Billings, RN as [place] Management   Name of the patient: Arthur [name]  [id number]  04/08/1933  Date of visit: 03/07/19   INTERVAL HISTORY-  Patient is eating lunch.  No new complaints.    Review of systems- Review of Systems  Constitutional: Positive for fatigue.  HENT:   Negative for sore throat.   Respiratory: Negative for cough.   Gastrointestinal: Negative for abdominal pain.  Musculoskeletal: Negative for arthralgias.  Skin: Negative for rash.  Hematological: Does not bruise/bleed easily.    Allergies  Allergen Reactions  . Codeine Other (See Comments)    Constipation    Patient Active Problem List   Diagnosis Date Noted  . Constipation   . DNR (do not resuscitate) 03/05/2019  . Generalized abdominal pain   . Gastroesophageal reflux disease without esophagitis   . Status post thoracentesis   . Pleural effusion on right 03/04/2019  . S/P kyphoplasty 02/18/2019  . Goals of care, counseling/discussion 01/16/2019  . CKD (chronic kidney disease) stage 4, GFR 15-29 ml/min (HCC) 01/16/2019  . CKD stage 4 due to type 2 diabetes mellitus ([place])   . Closed compression fracture of second lumbar vertebra ([place])   . CKD stage 3 due to type 2 diabetes mellitus ([place])   . Chronic respiratory failure with hypoxia ([place])   . Benign prostatic hyperplasia without lower urinary tract symptoms   . History of anemia due to chronic kidney disease   . Anemia of chronic kidney failure 08/21/2018  . UTI (urinary tract infection) 08/07/2018  . Acute on chronic renal failure ([place]) 08/07/2018  . HLD (hyperlipidemia) 07/24/2018  . Acute on chronic respiratory failure with hypoxia ([place]) 07/06/2018  . Symptomatic anemia 06/27/2018   . GI bleeding 06/09/2018  . Sepsis secondary to UTI ([place]) 05/23/2018  . Cellulitis of right lower extremity 05/22/2018  . Diabetes ([place]) 10/24/2006  . OBESITY 10/24/2006  . Essential hypertension 10/24/2006  . ALLERGIC RHINITIS 10/24/2006  . COPD (chronic obstructive pulmonary disease) ([place]) 10/24/2006  . TOBACCO ABUSE, HX OF 10/24/2006     Past Medical History:  Diagnosis Date  . Anemia of chronic kidney failure 08/21/2018  . Arthritis    lower back  . CHF (congestive heart failure) ([place])   . CKD (chronic kidney disease), stage III   . COPD (chronic obstructive pulmonary disease) ([place])   . Diabetes mellitus without complication ([place])    type 2  . Dyspnea    when active -wears oxygen 2L via [place]  . History of blood transfusion   . Hyperlipidemia   . Hypertension   . Requires continuous at home supplemental oxygen    2L via [place]     Past Surgical History:  Procedure Laterality Date  . COLONOSCOPY WITH PROPOFOL N/A 06/11/2018   Procedure: COLONOSCOPY WITH PROPOFOL;  Surgeon: Jonathon Bellows, MD;  Location: [facility] ENDOSCOPY;  Service: Gastroenterology;  Laterality: N/A;  . COLONOSCOPY WITH PROPOFOL N/A 06/13/2018   Procedure: COLONOSCOPY WITH PROPOFOL;  Surgeon: Jonathon Bellows, MD;  Location: [facility] ENDOSCOPY;  Service: Gastroenterology;  Laterality: N/A;  . ESOPHAGOGASTRODUODENOSCOPY Left 06/10/2018   Procedure: ESOPHAGOGASTRODUODENOSCOPY (EGD);  Surgeon: Jonathon Bellows, MD;  Location: [facility] ENDOSCOPY;  Service: Gastroenterology;  Laterality: Left;  . EYE SURGERY     removed cataracts  . GIVENS CAPSULE STUDY N/A 06/28/2018  Procedure: GIVENS CAPSULE STUDY;  Surgeon: Lucilla Lame, MD;  Location: [facility] ENDOSCOPY;  Service: Endoscopy;  Laterality: N/A;  . GIVENS CAPSULE STUDY N/A 06/30/2018   Procedure: GIVENS CAPSULE STUDY;  Surgeon: Jonathon Bellows, MD;  Location: [facility] ENDOSCOPY;  Service: Gastroenterology;  Laterality: N/A;  . KYPHOPLASTY N/A 10/31/2018   Procedure: KYPHOPLASTY L2;  Surgeon: [name], MD;  Location: [facility] ORS;  Service: Orthopedics;  Laterality: N/A;  . KYPHOPLASTY N/A 12/24/2018   Procedure: KYPHOPLASTY L3;  Surgeon: [name], MD;  Location: [facility] ORS;  Service: Orthopedics;  Laterality: N/A;  . KYPHOPLASTY N/A 02/18/2019   Procedure: L2 KYPHOPLASTY;  Surgeon: [name], MD;  Location: [facility] ORS;  Service: Orthopedics;  Laterality: N/A;  . RADIOLOGY WITH ANESTHESIA N/A 01/28/2019   Procedure: MRI LUMBER SPINE WITHOUT CONTRAST;  Surgeon: Radiologist, Medication, MD;  Location: [place];  Service: Radiology;  Laterality: N/A;    Social History   Socioeconomic History  . Marital status: Widowed    Spouse name: Not on file  . Number of children: Not on file  . Years of education: Not on file  . Highest education level: Not on file  Occupational History  . Not on file  Tobacco Use  . Smoking status: Former Smoker    Packs/day: 2.00    Years: 35.00    Pack years: 70.00    Types: Cigarettes    Quit date: 11/04/1982    Years since quitting: 36.3  . Smokeless tobacco: Current User    Types: Chew  Substance and Sexual Activity  . Alcohol use: Not Currently  . Drug use: No  . Sexual activity: Not Currently  Other Topics Concern  . Not on file  Social History Narrative  . Not on file   Social Determinants of Health   Financial Resource Strain: Low Risk   . Difficulty of Paying Living Expenses: Not hard at all  Food Insecurity: No Food Insecurity  . Worried About Charity fundraiser in the Last Year: Never true  . Ran Out of Food in the Last Year: Never true  Transportation Needs: No Transportation Needs  . Lack of Transportation (Medical): No  . Lack of Transportation (Non-Medical): No  Physical Activity: Unknown  . Days of Exercise per Week: 0 days  . Minutes of Exercise per Session: Not on file  Stress: No Stress Concern Present  . Feeling of Stress : Not at all  Social Connections:   . Frequency of Communication with Friends and Family: Not  on file  . Frequency of Social Gatherings with Friends and Family: Not on file  . Attends Religious Services: Not on file  . Active Member of Clubs or Organizations: Not on file  . Attends Archivist Meetings: Not on file  . Marital Status: Not on file  Intimate Partner Violence: Unknown  . Fear of Current or Ex-Partner: Patient refused  . Emotionally Abused: Patient refused  . Physically Abused: Patient refused  . Sexually Abused: Patient refused     Family History  Problem Relation Age of Onset  . COPD Mother   . Cancer Father      Current Facility-Administered Medications:  .  0.9 %  sodium chloride infusion, 250 mL, Intravenous, PRN, Max Sane, MD .  acetaminophen (TYLENOL) tablet 650 mg, 650 mg, Oral, Q6H PRN, 650 mg at 03/06/19 1415 **OR** acetaminophen (TYLENOL) suppository 650 mg, 650 mg, Rectal, Q6H PRN, Manuella Ghazi, Vipul, MD .  albuterol (PROVENTIL) (2.5 MG/3ML) 0.083% nebulizer solution 3  mL, 3 mL, Inhalation, Q6H PRN, Manuella Ghazi, Vipul, MD .  amLODipine (NORVASC) tablet 2.5 mg, 2.5 mg, Oral, Daily, Manuella Ghazi, Vipul, MD, 2.5 mg at 03/07/19 0847 .  ascorbic acid (VITAMIN C) tablet 500 mg, 500 mg, Oral, QHS, Manuella Ghazi, Vipul, MD, 500 mg at 03/06/19 2217 .  bisacodyl (DULCOLAX) EC tablet 5 mg, 5 mg, Oral, Daily PRN, Manuella Ghazi, Vipul, MD .  docusate sodium (COLACE) capsule 100 mg, 100 mg, Oral, BID, Max Sane, MD, 100 mg at 03/07/19 0848 .  insulin aspart (novoLOG) injection 0-5 Units, 0-5 Units, Subcutaneous, QHS, Shah, Vipul, MD .  insulin aspart (novoLOG) injection 0-9 Units, 0-9 Units, Subcutaneous, TID WC, Max Sane, MD, 2 Units at 03/07/19 1222 .  lactulose (CHRONULAC) 10 GM/15ML solution 30 g, 30 g, Oral, TID, Leslye Peer, Richard, MD, 30 g at 03/06/19 2216 .  metoCLOPramide (REGLAN) tablet 5 mg, 5 mg, Oral, TID AC & HS, Manuella Ghazi, Vipul, MD, 5 mg at 03/07/19 1223 .  ondansetron (ZOFRAN) tablet 4 mg, 4 mg, Oral, Q6H PRN **OR** ondansetron (ZOFRAN) injection 4 mg, 4 mg, Intravenous, Q6H PRN,  Manuella Ghazi, Vipul, MD .  polyethylene glycol (MIRALAX / GLYCOLAX) packet 17 g, 17 g, Oral, QODAY, Manuella Ghazi, Vipul, MD, 17 g at 03/07/19 0853 .  potassium chloride (KLOR-CON) CR tablet 10 mEq, 10 mEq, Oral, QPM, Manuella Ghazi, Vipul, MD, 10 mEq at 03/06/19 1706 .  pravastatin (PRAVACHOL) tablet 10 mg, 10 mg, Oral, QHS, Max Sane, MD, 10 mg at 03/06/19 2217 .  promethazine (PHENERGAN) tablet 25 mg, 25 mg, Oral, Q4H PRN, Manuella Ghazi, Vipul, MD .  sodium chloride flush (NS) 0.9 % injection 3 mL, 3 mL, Intravenous, Q12H, Max Sane, MD, 3 mL at 03/07/19 0853 .  sodium chloride flush (NS) 0.9 % injection 3 mL, 3 mL, Intravenous, PRN, Max Sane, MD, 3 mL at 03/04/19 1703 .  tamsulosin (FLOMAX) capsule 0.4 mg, 0.4 mg, Oral, Daily, Manuella Ghazi, Vipul, MD, 0.4 mg at 03/07/19 0848 .  tiotropium (SPIRIVA) inhalation capsule ([facility] use ONLY) 18 mcg, 18 mcg, Inhalation, Daily, Max Sane, MD, 18 mcg at 03/07/19 0850 .  torsemide (DEMADEX) tablet 20 mg, 20 mg, Oral, Q M,W,F, Manuella Ghazi, Vipul, MD, 20 mg at 03/07/19 0847 .  traZODone (DESYREL) tablet 25 mg, 25 mg, Oral, QHS PRN, Max Sane, MD, 25 mg at 03/04/19 2214   Physical exam:  Vitals:   03/06/19 2228 03/07/19 0150 03/07/19 0500 03/07/19 0547  BP: (!) 149/61   (!) 142/47  Pulse: 72 75  71  Resp:    18  Temp: 97.7 F (36.5 C)   99.2 F (37.3 C)  TempSrc: Oral   Oral  SpO2: 100% 97%  95%  Weight:   196 lb 6.4 oz (89.1 kg)   Height:       Physical Exam  Constitutional: No distress.  Eyes: No scleral icterus.  Pulmonary/Chest: Effort normal.  Decreased breath sound bilaterally.  He breath  comfortably via nasal cannula oxygen  Abdominal: Soft. He exhibits no distension.  Musculoskeletal:        General: No deformity.  Neurological: He is alert.  Skin: There is pallor.       CMP Latest Ref Rng & Units 03/07/2019  Glucose 70 - 99 mg/dL 151(H)  BUN 8 - 23 mg/dL 34(H)  Creatinine 0.61 - 1.24 mg/dL 1.94(H)  Sodium 135 - 145 mmol/L 132(L)  Potassium 3.5 - 5.1 mmol/L 3.8    Chloride 98 - 111 mmol/L 95(L)  CO2 22 - 32 mmol/L 29  Calcium 8.9 -  10.3 mg/dL 8.1(L)  Total Protein 6.5 - 8.1 g/dL -  Total Bilirubin 0.3 - 1.2 mg/dL -  Alkaline Phos 38 - 126 U/L -  AST 15 - 41 U/L -  ALT 0 - 44 U/L -   CBC Latest Ref Rng & Units 03/07/2019  WBC 4.0 - 10.5 K/uL -  Hemoglobin 13.0 - 17.0 g/dL 7.6(L)  Hematocrit 39.0 - 52.0 % -  Platelets 150 - 400 K/uL -    RADIOGRAPHIC STUDIES: I have personally reviewed the radiological images as listed and agreed with the findings in the report.  DG Chest 2 View  Result Date: 03/04/2019 CLINICAL DATA:  Pleural effusion EXAM: CHEST - 2 VIEW COMPARISON:  10/27/2018 FINDINGS: Moderate right pleural effusion with interval progression. Progressive right lower lobe infiltrate. Pulmonary hyperinflation with COPD. No infiltrate or mass on the left. Heart size normal. Negative for heart failure. Atherosclerotic aortic arch. IMPRESSION: COPD. Progression of right pleural effusion and right lower lobe airspace disease. Possible chronic pneumonia however underlying neoplasm possible. Correlate with prior thoracentesis resolved. Consider follow-up CT chest with contrast. Electronically Signed   By: Franchot Gallo M.D.   On: 03/04/2019 14:54   DG Lumbar Spine 2-3 Views  Result Date: 02/18/2019 CLINICAL DATA:  Kyphoplasty EXAM: LUMBAR SPINE - 2-3 VIEW; DG C-ARM 1-60 MIN COMPARISON:  12/24/2018, MRI 01/28/2019 FINDINGS: Two lateral and 2 AP spot fluoroscopic images of the lumbar spine demonstrate 2 level kyphoplasty changes. Total fluoroscopy time was 2 minutes. IMPRESSION: Intraoperative fluoroscopic images of the lumbar spine for kyphoplasty. Electronically Signed   By: Donavan Foil M.D.   On: 02/18/2019 16:20   CT Abdomen Pelvis W Contrast  Result Date: 03/04/2019 CLINICAL DATA:  Lower abdominal pain, nausea and vomiting for 1 week. EXAM: CT ABDOMEN AND PELVIS WITH CONTRAST TECHNIQUE: Multidetector CT imaging of the abdomen and pelvis was  performed using the standard protocol following bolus administration of intravenous contrast. CONTRAST:  60mL OMNIPAQUE IOHEXOL 300 MG/ML  SOLN COMPARISON:  10/27/2018 FINDINGS: Lower chest: Persistent right pleural effusion with progressive right lower lobe atelectasis. Scattered calcifications are noted in the atelectatic lung. These appear new and could be due to chronic inflammation or aspiration. I do not see any obvious pleural lesions possible areas of vague pleural enhancement. Thoracentesis may be helpful for diagnostic purposes. The left lung base is grossly clear. Minimal dependent subpleural atelectasis. The heart is normal in size. No pericardial effusion. Stable coronary artery and aortic calcifications. Borderline enlarged subcarinal node measuring 9 mm. Hepatobiliary: No focal hepatic lesions or intrahepatic biliary dilatation. The gallbladder is mildly dilated and contains a 18 mm rim calcified calculus. No gallbladder wall thickening or pericholecystic inflammatory changes to suggest acute cholecystitis. No common bile duct dilatation. Pancreas: No mass, inflammation or ductal dilatation. Small duodenal diverticulum noted near the pancreatic head. Spleen: Normal size. No focal lesions. Adrenals/Urinary Tract: Stable low-attenuation nodule involving the medial limb of the left adrenal gland consistent with a benign adenoma. No worrisome renal lesions or hydronephrosis. No renal or obstructing ureteral calculi. The bladder is unremarkable. No bladder mass or asymmetric bladder wall thickening. Mild prostate gland enlargement with median lobe hypertrophy impressing on the base of the bladder. Stomach/Bowel: The stomach, duodenum, small bowel and colon are grossly normal without oral contrast. No acute inflammatory changes, mass lesions or obstructive findings. The terminal ileum and appendix are normal. Vascular/Lymphatic: Advanced atherosclerotic calcifications involving the aorta, iliac arteries and  branch vessels. No focal aneurysm or dissection. The major venous structures  are patent. No mesenteric or retroperitoneal mass or adenopathy. Reproductive: Mild prostate gland enlargement with median lobe hypertrophy impressing on the base of the bladder. The seminal vesicles appear normal. Other: Small periumbilical abdominal wall hernia containing fat is stable. No pelvic mass or pelvic adenopathy. No free pelvic fluid collections. Musculoskeletal: Vertebral augmentation changes are noted at L2 and L3, new since prior study. No new spine fractures are identified. The hips are normally located. No worrisome bone lesions. IMPRESSION: 1. No acute abdominal/pelvic findings, mass lesions or lymphadenopathy. 2. Persistent right pleural effusion with progressive right lower lobe atelectasis. There are also calcifications in the atelectatic lung which could be due to chronic inflammation or aspiration. Thoracentesis may be helpful for diagnostic purposes. Vague areas of pleural enhancement are suspected. 3. Cholelithiasis.  No CT findings for acute cholecystitis. 4. Advanced atherosclerotic calcifications involving the aorta and branch vessels. 5. Stable left adrenal gland adenoma. 6. Vertebral augmentation changes at L2 and L3. Aortic Atherosclerosis (ICD10-I70.0). Electronically Signed   By: Marijo Sanes M.D.   On: 03/04/2019 13:46   DG Chest Port 1 View  Result Date: 03/05/2019 CLINICAL DATA:  Post right-sided thoracentesis EXAM: PORTABLE CHEST 1 VIEW COMPARISON:  Chest radiograph-03/04/2019; chest CT-10/07/2018; CT abdomen pelvis-03/04/2019 FINDINGS: Grossly unchanged cardiac silhouette and mediastinal contours with partial obscuration of the right heart border secondary to heterogeneous/consolidative opacities, as demonstrated on preceding abdominal CT. Atherosclerotic plaque within thoracic aorta. Interval reduction/resolution of right-sided pleural effusion post thoracentesis with development of a loculated  right basilar ex vacuo hydropneumothorax. There is no definitive apical component of this ex vacuo pneumothorax. No mediastinal shift. The left hemithorax remains well aerated. No left-sided pleural effusion. No evidence of edema. No acute osseous abnormalities. IMPRESSION: 1. Interval reduction/resolution of right-sided pleural effusion post thoracentesis with development of a loculated right basilar ex vacuo hydropneumothorax. 2. Partial obscuration of right heart border secondary to right perihilar heterogeneous/consolidative opacities as demonstrated on preceding abdominal CT. Electronically Signed   By: Sandi Mariscal M.D.   On: 03/05/2019 10:23   DG C-Arm 1-60 Min  Result Date: 02/18/2019 CLINICAL DATA:  Kyphoplasty EXAM: LUMBAR SPINE - 2-3 VIEW; DG C-ARM 1-60 MIN COMPARISON:  12/24/2018, MRI 01/28/2019 FINDINGS: Two lateral and 2 AP spot fluoroscopic images of the lumbar spine demonstrate 2 level kyphoplasty changes. Total fluoroscopy time was 2 minutes. IMPRESSION: Intraoperative fluoroscopic images of the lumbar spine for kyphoplasty. Electronically Signed   By: Donavan Foil M.D.   On: 02/18/2019 16:20   US THORACENTESIS ASP PLEURAL SPACE W/IMG GUIDE  Result Date: 03/05/2019 INDICATION: Symptomatic right-sided pleural effusion. Please perform ultrasound-guided thoracentesis for diagnostic and therapeutic purposes. EXAM: US THORACENTESIS ASP PLEURAL SPACE W/IMG GUIDE COMPARISON:  Chest radiograph-earlier same day; CT abdomen pelvis-03/04/2019 Ultrasound-guided right-sided thoracentesis-08/08/2018; 07/10/2018 MEDICATIONS: None. COMPLICATIONS: SIR Level A - No therapy, no consequence. Procedure complicated by development of an asymptomatic right basilar ex vacuo hydropneumothorax. TECHNIQUE: Informed written consent was obtained from the patient after a discussion of the risks, benefits and alternatives to treatment. A timeout was performed prior to the initiation of the procedure. Initial ultrasound  scanning demonstrates a moderate sized anechoic right-sided pleural effusion. The lower chest was prepped and draped in the usual sterile fashion. 1% lidocaine was used for local anesthesia. An ultrasound image was saved for documentation purposes. An 8 Fr Safe-T-Centesis catheter was introduced. The thoracentesis was performed. The catheter was removed and a dressing was applied. The patient tolerated the procedure well without immediate post procedural complication. The patient was  escorted to have an upright chest radiograph. FINDINGS: A total of approximately 700 cc of serous, slightly blood tinged fluid was removed. Requested samples were sent to the laboratory. IMPRESSION: Successful ultrasound-guided right sided thoracentesis yielding 700 cc of pleural fluid. Electronically Signed   By: Sandi Mariscal M.D.   On: 03/05/2019 11:45    Assessment and plan-   #Anemia secondary to chronic kidney disease, received 1 dose of Aranesp 150 MCG Also received 1 unit of PRBC transfusion during this admission. Today hemoglobin has trended up to 7.6. He can continue follow-up with me outpatient for additional erythropoietin treatment.  #Right pleural effusion, status post diagnostic and therapeutic thoracentesis, removed 0.7 L of serous likely blood stained fluid.  Fluid cytology was negative for malignant cells.  Thank you for allowing me to participate in the care of this patient.   Earlie Server, MD, PhD Hematology Oncology [facility] at [facility] Pager- [phone] 03/07/2019

## 2019-03-07 NOTE — TOC Progression Note (Addendum)
Transition of Care [facility]) - Progression Note    Patient Details  Name: Arthur [name] MRN: [id number] Date of Birth: [birth year]  Transition of Care [facility]) CM/SW [place], LCSW Phone Number: 03/07/2019, 11:08 AM  Clinical Narrative:   CSW was informed by MD that the plan is now for patient to return to [facility] for rehab if possible. CSW spoke with patient who confirmed he would like to do this. Sent FL2 to [place] at [facility], left a voicemail for her requesting a return call.  11:50- Return call from [place] with [facility]. Magda Paganini reported they can take patient back for rehab, she will start insurance authorization which could take up to 1-2 days. Magda Paganini reported patient will need another COVID test if he is not discharged by Sunday. Informed Magda Paganini of need for a palliative consult at the SNF, she reported she will have this set up. Updated MD.     Expected Discharge Plan: [place] Barriers to Discharge: Continued Medical Work up  Expected Discharge Plan and Services Expected Discharge Plan: [place] arrangements for the past 2 months: Skilled Nursing Facility(was at [facility]) Expected Discharge Date: 03/07/19                                     Social Determinants of Health (SDOH) Interventions    Readmission Risk Interventions Readmission Risk Prevention Plan 03/05/2019 11/05/2018 10/29/2018  Transportation Screening Complete Complete Complete  PCP or Specialist Appt within 5-7 Days - - -  PCP or Specialist Appt within 3-5 Days - - -  Home Care Screening - - -  Medication Review (RN CM) - - -   [place] or [place] Work Consult for [place] - - -  Medication Review Press photographer) Complete Complete Complete  PCP or Specialist appointment within 3-5 days of discharge Complete (No Data) -  [place] or Home  Care Consult Complete - -  SW Recovery Care/Counseling Consult - - -  Palliative Care Screening - Not Applicable -  [place] - Complete -  Some recent data might be hidden

## 2019-03-07 NOTE — Progress Notes (Addendum)
Daily Progress Note   Patient Name: Arthur [name]       Date: 03/07/2019 DOB: 1933-07-16  Age: 86 y.o. MRN#: [id number] Attending Physician: Loletha Grayer, MD Primary Care Physician: Baxter Hire, MD Admit Date: 03/04/2019  Reason for Consultation/Follow-up: Establishing goals of care  Subjective: Patient is resting in bed with eyes closed. He awakens to answer questions with short answers. He states "yes" when asked if he is felling okay today. He states "yes" when asked if he was able to speak with his daughter about his wishes. He said "I don't know" when attempting to clarify plans per their conversation. He said "yes" when asked if he wanted to continue his current care as he has been.   Discussed yesterday with daughter plans to speak this morning after their conversation last evening. Attempted to call her unsuccessfully.   Length of Stay: 3  Current Medications: Scheduled Meds:  . amLODipine  2.5 mg Oral Daily  . ascorbic acid  500 mg Oral QHS  . docusate sodium  100 mg Oral BID  . insulin aspart  0-5 Units Subcutaneous QHS  . insulin aspart  0-9 Units Subcutaneous TID WC  . lactulose  30 g Oral TID  . metoCLOPramide  5 mg Oral TID AC & HS  . polyethylene glycol  17 g Oral QODAY  . potassium chloride  10 mEq Oral QPM  . pravastatin  10 mg Oral QHS  . sodium chloride flush  3 mL Intravenous Q12H  . tamsulosin  0.4 mg Oral Daily  . tiotropium  18 mcg Inhalation Daily  . torsemide  20 mg Oral Q M,W,F    Continuous Infusions: . sodium chloride      PRN Meds: sodium chloride, acetaminophen **OR** acetaminophen, albuterol, bisacodyl, ondansetron **OR** ondansetron (ZOFRAN) IV, promethazine, sodium chloride flush, traZODone  Physical Exam Pulmonary:     Effort:  Pulmonary effort is normal.  Neurological:     Mental Status: He is alert.             Vital Signs: BP (!) 142/47 (BP Location: Left Arm)   Pulse 71   Temp 99.2 F (37.3 C) (Oral)   Resp 18   Ht 5\' 8"  (1.727 m)   Wt 89.1 kg   SpO2 95%   BMI 29.86 kg/m  SpO2:  SpO2: 95 % O2 Device: O2 Device: Room Air O2 Flow Rate: O2 Flow Rate (L/min): 3 L/min  Intake/output summary:   Intake/Output Summary (Last 24 hours) at 03/07/2019 1104 Last data filed at 03/07/2019 0900 Gross per 24 hour  Intake 530 ml  Output 450 ml  Net 80 ml   LBM: Last BM Date: 03/07/19 Baseline Weight: Weight: 93 kg Most recent weight: Weight: 89.1 kg       Palliative Assessment/Data:      Patient Active Problem List   Diagnosis Date Noted  . Constipation   . DNR (do not resuscitate) 03/05/2019  . Generalized abdominal pain   . Gastroesophageal reflux disease without esophagitis   . Status post thoracentesis   . Pleural effusion on right 03/04/2019  . S/P kyphoplasty 02/18/2019  . Goals of care, counseling/discussion 01/16/2019  . CKD (chronic kidney disease) stage 4, GFR 15-29 ml/min (HCC) 01/16/2019  . CKD stage 4 due to type 2 diabetes mellitus ([place])   . Closed compression fracture of second lumbar vertebra ([place])   . CKD stage 3 due to type 2 diabetes mellitus ([place])   . Chronic respiratory failure with hypoxia ([place])   . Benign prostatic hyperplasia without lower urinary tract symptoms   . History of anemia due to chronic kidney disease   . Anemia of chronic kidney failure 08/21/2018  . UTI (urinary tract infection) 08/07/2018  . Acute on chronic renal failure ([place]) 08/07/2018  . HLD (hyperlipidemia) 07/24/2018  . Acute on chronic respiratory failure with hypoxia ([place]) 07/06/2018  . Symptomatic anemia 06/27/2018  . GI bleeding 06/09/2018  . Sepsis secondary to UTI ([place]) 05/23/2018  . Cellulitis of right lower extremity 05/22/2018  . Diabetes ([place]) 10/24/2006  . OBESITY 10/24/2006  .  Essential hypertension 10/24/2006  . ALLERGIC RHINITIS 10/24/2006  . COPD (chronic obstructive pulmonary disease) ([place]) 10/24/2006  . TOBACCO ABUSE, HX OF 10/24/2006    Palliative Care Assessment & Plan    Recommendations/Plan:  Patient  says "I don't know" when attempting to discuss his conversation with his daughter last evening on care moving forward. Unable to reach daughter this morning.  Recommend continuing current care.  If patient D/C's today or over weekend, recommend palliative to follow at D/C for further [place] conversations.    Code Status:    Code Status Orders  (From admission, onward)         Start     Ordered   03/04/19 1544  Do not attempt resuscitation (DNR)  Continuous    Question Answer Comment  In the event of cardiac or respiratory ARREST Do not call a "code blue"   In the event of cardiac or respiratory ARREST Do not perform Intubation, CPR, defibrillation or ACLS   In the event of cardiac or respiratory ARREST Use medication by any route, position, wound care, and other measures to relive pain and suffering. May use oxygen, suction and manual treatment of airway obstruction as needed for comfort.      03/04/19 1543        Code Status History    Date Active Date Inactive Code Status Order ID Comments User Context   02/18/2019 1832 02/20/2019 2120 DNR [id number]  [name], MD Inpatient   12/24/2018 1730 12/24/2018 2132 Full Code [id number]  [name], MD Inpatient   10/27/2018 2342 11/05/2018 1711 DNR [id number]  Christel Mormon, MD ED   10/27/2018 2342 10/27/2018 2342 Full Code [id number]  Mansy, Arvella Merles, MD ED   08/07/2018 2329  08/09/2018 1634 DNR [id number]  Lance Coon, MD Inpatient   07/25/2018 0955 07/26/2018 1908 DNR [id number]  Bettey Costa, MD Inpatient   07/25/2018 0138 07/25/2018 0954 DNR [id number]  Lance Coon, MD Inpatient   07/06/2018 2054 07/10/2018 1959 DNR [id number]  Henreitta Leber, MD Inpatient   06/27/2018 1150 07/05/2018 1757 DNR [id number]   Lang Snow, NP Inpatient   06/09/2018 1350 06/14/2018 1730 DNR [id number]  Lang Snow, NP ED   05/23/2018 0919 05/28/2018 2024 DNR [id number]  Demetrios Loll, MD Inpatient   05/23/2018 0104 05/23/2018 0918 Full Code [id number]  Mayer Camel, NP Inpatient   Advance Care Planning Activity       Prognosis:  < 6 months, poor PO intake, anemia.     Thank you for allowing the Palliative Medicine Team to assist in the care of this patient.   Total Time 15 min Prolonged Time Billed no      Greater than 50%  of this time was spent counseling and coordinating care related to the above assessment and plan.  Asencion Gowda, NP  Please contact Palliative Medicine Team phone at [phone] for questions and concerns.

## 2019-03-08 DIAGNOSIS — N1831 Chronic kidney disease, stage 3a: Secondary | ICD-10-CM

## 2019-03-08 LAB — BODY FLUID CULTURE: Culture: NO GROWTH

## 2019-03-08 LAB — BASIC METABOLIC PANEL
Anion gap: 10 (ref 5–15)
BUN: 36 mg/dL — ABNORMAL HIGH (ref 8–23)
CO2: 30 mmol/L (ref 22–32)
Calcium: 8.1 mg/dL — ABNORMAL LOW (ref 8.9–10.3)
Chloride: 91 mmol/L — ABNORMAL LOW (ref 98–111)
Creatinine, Ser: 1.73 mg/dL — ABNORMAL HIGH (ref 0.61–1.24)
GFR calc Af Amer: 41 mL/min — ABNORMAL LOW (ref >60)
GFR calc non Af Amer: 36 mL/min — ABNORMAL LOW (ref >60)
Glucose, Bld: 195 mg/dL — ABNORMAL HIGH (ref 70–99)
Potassium: 3.7 mmol/L (ref 3.5–5.1)
Sodium: 131 mmol/L — ABNORMAL LOW (ref 135–145)

## 2019-03-08 LAB — GLUCOSE, CAPILLARY
Glucose-Capillary: 148 mg/dL — ABNORMAL HIGH (ref 70–99)
Glucose-Capillary: 191 mg/dL — ABNORMAL HIGH (ref 70–99)
Glucose-Capillary: 297 mg/dL — ABNORMAL HIGH (ref 70–99)
Glucose-Capillary: 183 mg/dL — ABNORMAL HIGH (ref 70–99)

## 2019-03-08 LAB — CHOLESTEROL, BODY FLUID: Cholesterol, Fluid: 37 mg/dL

## 2019-03-08 LAB — PREPARE RBC (CROSSMATCH)

## 2019-03-08 LAB — HEMOGLOBIN: Hemoglobin: 6.9 g/dL — ABNORMAL LOW (ref 13.0–17.0)

## 2019-03-08 MED ORDER — SODIUM CHLORIDE 0.9% IV SOLUTION: Freq: Once | INTRAVENOUS | Status: AC

## 2019-03-08 MED ORDER — ACETAMINOPHEN 325 MG PO TABS
650.0000 mg | ORAL_TABLET | Freq: Once | ORAL | Status: AC
  Administered 2019-03-08: 650 mg via ORAL
  Filled 2019-03-08: qty 2

## 2019-03-08 MED ORDER — INSULIN DETEMIR 100 UNIT/ML ~~LOC~~ SOLN
4.0000 [IU] | Freq: Every day | SUBCUTANEOUS | Status: DC
  Administered 2019-03-08 – 2019-03-09 (×2): 4 [IU] via SUBCUTANEOUS
  Filled 2019-03-08 (×3): qty 0.04

## 2019-03-08 MED ORDER — FUROSEMIDE 10 MG/ML IJ SOLN
20.0000 mg | Freq: Once | INTRAMUSCULAR | Status: AC
  Administered 2019-03-08: 20 mg via INTRAVENOUS
  Filled 2019-03-08: qty 4

## 2019-03-08 NOTE — Progress Notes (Signed)
Patient ID: Arthur [name], male   DOB: Dec 16, 1933, 86 y.o.   MRN: [id number] [facility] PROGRESS NOTE  Arthur [name] MPN:[id number] DOB: [birth year] DOA: 03/04/2019 PCP: Baxter Hire, MD  HPI/Subjective: Patient feeling okay.  Still feels little weak.  States his breathing is better than when he came in.  Objective: Vitals:   03/08/19 0805 03/08/19 1201  BP: (!) 141/50 (!) 133/48  Pulse: 67 68  Resp: 18 16  Temp: 97.7 F (36.5 C) 98.2 F (36.8 C)  SpO2: 92% 100%    Filed Weights   03/06/19 0500 03/07/19 0500 03/08/19 0526  Weight: 88.4 kg 89.1 kg 89.7 kg    ROS: Review of Systems  Constitutional: Positive for malaise/fatigue.  Eyes: Negative for blurred vision.  Respiratory: Negative for cough and shortness of breath.   Cardiovascular: Negative for chest pain.  Gastrointestinal: Negative for abdominal pain.  Genitourinary: Negative for dysuria.  Musculoskeletal: Negative for joint pain.  Neurological: Negative for dizziness.   Exam: Physical Exam  Constitutional: He is oriented to person, place, and time.  HENT:  Nose: No mucosal edema.  Mouth/Throat: No oropharyngeal exudate or posterior oropharyngeal edema.  Eyes: Conjunctivae and lids are normal.  Neck: Carotid bruit is not present.  Cardiovascular: S1 normal and S2 normal. Exam reveals no gallop.  No murmur heard. Respiratory: No respiratory distress. He has decreased breath sounds in the right lower field and the left lower field. He has no wheezes. He has no rhonchi. He has no rales.  GI: Soft. Bowel sounds are normal. There is no abdominal tenderness.  Musculoskeletal:     Right ankle: Swelling present.     Left ankle: Swelling present.  Lymphadenopathy:    He has no cervical adenopathy.  Neurological: He is alert and oriented to person, place, and time. No cranial nerve deficit.  Skin: Skin is warm. No rash noted. Nails show no clubbing.  Psychiatric: He has a normal mood and affect.      Data  Reviewed: Basic Metabolic Panel: Recent Labs  Lab 03/04/19 1220 03/05/19 0820 03/06/19 0542 03/07/19 0722 03/08/19 0959  NA 133* 133* 131* 132* 131*  K 3.3* 3.5 3.3* 3.8 3.7  CL 95* 94* 92* 95* 91*  CO2 28 27 29 29 30   GLUCOSE 161* 104* 135* 151* 195*  BUN 18 18 29* 34* 36*  CREATININE 1.54* 1.57* 1.84* 1.94* 1.73*  CALCIUM 8.3* 8.2* 7.8* 8.1* 8.1*   Liver Function Tests: Recent Labs  Lab 03/04/19 1220  AST 9*  ALT 8  ALKPHOS 73  BILITOT 0.8  PROT 6.9  ALBUMIN 3.3*   Recent Labs  Lab 03/04/19 1220  LIPASE 24   CBC: Recent Labs  Lab 03/04/19 1220 03/04/19 1220 03/05/19 0331 03/05/19 1701 03/06/19 0542 03/07/19 0722 03/08/19 0959  WBC 7.6  --  6.5  --  8.5  --   --   NEUTROABS 5.4  --   --   --   --   --   --   HGB 7.4*   < > 5.9* 7.1* 7.7* 7.6* 6.9*  HCT 23.3*  --  18.3* 22.3* 23.7*  --   --   MCV 91.4  --  91.5  --  88.8  --   --   PLT 366  --  274  --  288  --   --    < > = values in this interval not displayed.   BNP (last 3 results) Recent Labs  07/06/18 1613  BNP 132.0*     CBG: Recent Labs  Lab 03/07/19 1211 03/07/19 1627 03/07/19 2127 03/08/19 0755 03/08/19 1200  GLUCAP 155* 225* 210* 148* 191*    Recent Results (from the past 240 hour(s))  SARS CORONAVIRUS 2 (TAT 6-24 HRS) Nasopharyngeal Nasopharyngeal Swab     Status: None   Collection Time: 03/04/19  3:40 PM   Specimen: Nasopharyngeal Swab  Result Value Ref Range Status   SARS Coronavirus 2 NEGATIVE NEGATIVE Final    Comment: (NOTE) SARS-CoV-2 target nucleic acids are NOT DETECTED. The SARS-CoV-2 RNA is generally detectable in upper and lower respiratory specimens during the acute phase of infection. Negative results do not preclude SARS-CoV-2 infection, do not rule out co-infections with other pathogens, and should not be used as the sole basis for treatment or other patient management decisions. Negative results must be combined with clinical observations, patient  history, and epidemiological information. The expected result is Negative. Fact Sheet for Patients: SugarRoll.be Fact Sheet for Healthcare Providers: https://www.woods-mathews.com/ This test is not yet approved or cleared by the [place] FDA and  has been authorized for detection and/or diagnosis of SARS-CoV-2 by FDA under an Emergency Use Authorization (EUA). This EUA will remain  in effect (meaning this test can be used) for the duration of the COVID-19 declaration under Section 56 4(b)(1) of the Act, 21 U.S.C. section 360bbb-3(b)(1), unless the authorization is terminated or revoked sooner. Performed at [facility], [place] [address] [place], [place] [address]   Body fluid culture     Status: None   Collection Time: 03/05/19 10:12 AM   Specimen: PATH Cytology Pleural fluid  Result Value Ref Range Status   Specimen Description   Final    PLEURAL Performed at [facility], [address], [place], [place] [address]    Special Requests   Final    NONE Performed at [facility], [address] [place], [place] [address]    Gram Stain   Final    RARE WBC PRESENT, PREDOMINANTLY MONONUCLEAR NO ORGANISMS SEEN    Culture   Final    NO GROWTH 3 DAYS Performed at [facility], [place] [address], [place], [place] [address]    Report Status 03/08/2019 FINAL  Final     Scheduled Meds: . amLODipine  2.5 mg Oral Daily  . ascorbic acid  500 mg Oral QHS  . docusate sodium  100 mg Oral BID  . insulin aspart  0-5 Units Subcutaneous QHS  . insulin aspart  0-9 Units Subcutaneous TID WC  . metoCLOPramide  5 mg Oral TID AC & HS  . polyethylene glycol  17 g Oral QODAY  . potassium chloride  10 mEq Oral QPM  . pravastatin  10 mg Oral QHS  . sodium chloride flush  3 mL Intravenous Q12H  . tamsulosin  0.4 mg Oral Daily  . tiotropium  18 mcg Inhalation Daily  . torsemide  20 mg Oral Q M,W,F   Continuous  Infusions: . sodium chloride      Assessment/Plan:  1. Symptomatic anemia with acute blood loss anemia.  Hemoglobin dropped to 6.9 today.  Will transfuse 1 unit of packed red blood cells.  (This will be the third unit of this hospitalization).  Guaiac stools.  Patient hesitant on GI work-up.  Received Aranesp while here.  Follow-up with [name] as outpatient. 2. Right pleural effusion status post thoracentesis 03/05/2019 and 600 mL taken off.  3. Chronic  kidney disease stage IIIa 4. Acute on chronic respiratory failure with hypoxia.  Patient had his oxygen on this morning. 5. Type 2 diabetes mellitus with chronic kidney disease stage IIIa.  Last hemoglobin A1c 6.7.  Patient currently on sliding scale insulin here in the hospital. 6. Essential hypertension on amlodipine 7. GERD on Protonix 8. COPD.  Continue inhalers 9. BPH on Flomax 10. Hyperlipidemia unspecified on Pravachol 11. Hypokalemia.  Replaced. 12. Weakness.  Physical therapy recommended rehab.  Patient will go back to [facility] once insurance authorizes.  Code Status:     Code Status Orders  (From admission, onward)         Start     Ordered   03/04/19 1544  Do not attempt resuscitation (DNR)  Continuous    Question Answer Comment  In the event of cardiac or respiratory ARREST Do not call a "code blue"   In the event of cardiac or respiratory ARREST Do not perform Intubation, CPR, defibrillation or ACLS   In the event of cardiac or respiratory ARREST Use medication by any route, position, wound care, and other measures to relive pain and suffering. May use oxygen, suction and manual treatment of airway obstruction as needed for comfort.      03/04/19 1543        Code Status History    Date Active Date Inactive Code Status Order ID Comments User Context   02/18/2019 1832 02/20/2019 2120 DNR [id number]  [name], MD Inpatient   12/24/2018 1730 12/24/2018 2132 Full Code [id number]  [name], MD Inpatient    10/27/2018 2342 11/05/2018 1711 DNR [id number]  Christel Mormon, MD ED   10/27/2018 2342 10/27/2018 2342 Full Code [id number]  Mansy, Arvella Merles, MD ED   08/07/2018 2329 08/09/2018 1634 DNR [id number]  Lance Coon, MD Inpatient   07/25/2018 0955 07/26/2018 1908 DNR [id number]  Bettey Costa, MD Inpatient   07/25/2018 0138 07/25/2018 0954 DNR [id number]  Lance Coon, MD Inpatient   07/06/2018 2054 07/10/2018 1959 DNR [id number]  Henreitta Leber, MD Inpatient   06/27/2018 1150 07/05/2018 1757 DNR [id number]  Lang Snow, NP Inpatient   06/09/2018 1350 06/14/2018 1730 DNR [id number]  Lang Snow, NP ED   05/23/2018 0919 05/28/2018 2024 DNR [id number]  Demetrios Loll, MD Inpatient   05/23/2018 0104 05/23/2018 0918 Full Code [id number]  Mayer Camel, NP Inpatient   Advance Care Planning Activity     Family Communication: Spoke with patient's daughter on the phone and gave update. Disposition Plan: Insurance authorization did not happen on Friday and likely will not happen until Monday.  Patient will go out to rehab once insurance authorizes.  Consultants:  Hematology  Time spent: 27 minutes  [place]

## 2019-03-08 NOTE — Progress Notes (Signed)
OT Cancellation Note  Patient Details Name: DAVAUN QUINTELA MRN: [id number] DOB: 1933-08-10   Cancelled Treatment:     Attempted to see patient this date, Patient with Hgb of 6.9 and is planning to receive transfusion, will reattempt OT tx sessions after transfusion and when patient can actively participate.    Carmichael Burdette T Salomon Ganser, OTR/L, CLT   Deem Marmol 03/08/2019, 1:39 PM

## 2019-03-08 NOTE — TOC Progression Note (Signed)
Transition of Care [facility]) - Progression Note    Patient Details  Name: Arthur [name] MRN: [id number] Date of Birth: 07-12-1933  Transition of Care [facility]) CM/SW Contact  Truitt Merle, LCSW Phone Number: 03/08/2019, 10:15 AM  Clinical Narrative:    LCSW called and spoke with Renaee Munda Director at [facility] [phone]) who stated she had not yet received insurance auth. Magda Paganini stated it will likely not be received until Monday, but will call us back if received this weekend. TOC continuing to follow for transitions of care/discharge planning.    Expected Discharge Plan: [place] Barriers to Discharge: Continued Medical Work up  Expected Discharge Plan and Services Expected Discharge Plan: [place] arrangements for the past 2 months: Skilled Nursing Facility(was at [facility]) Expected Discharge Date: 03/07/19                                     Social Determinants of Health (SDOH) Interventions    Readmission Risk Interventions Readmission Risk Prevention Plan 03/05/2019 11/05/2018 10/29/2018  Transportation Screening Complete Complete Complete  PCP or Specialist Appt within 5-7 Days - - -  PCP or Specialist Appt within 3-5 Days - - -  Home Care Screening - - -  Medication Review (RN CM) - - -  [place] or [place] Work Consult for [place] - - -  Medication Review [employer]) Complete Complete Complete  PCP or Specialist appointment within 3-5 days of discharge Complete (No Data) -  [place] or Home Care Consult Complete - -  SW Recovery Care/Counseling Consult - - -  Palliative Care Screening - Not Applicable -  [place] - Complete -  Some recent data might be hidden

## 2019-03-09 DIAGNOSIS — R531 Weakness: Secondary | ICD-10-CM

## 2019-03-09 LAB — TYPE AND SCREEN
ABO/RH(D): A POS
Antibody Screen: NEGATIVE
Unit division: 0
Unit division: 0
Unit division: 0

## 2019-03-09 LAB — BPAM RBC
Blood Product Expiration Date: 202103092359
Blood Product Expiration Date: 202103092359
Blood Product Expiration Date: 202104042359
ISSUE DATE / TIME: 202103031057
ISSUE DATE / TIME: 202103031853
ISSUE DATE / TIME: 202103061303
Unit Type and Rh: 600
Unit Type and Rh: 6200
Unit Type and Rh: 6200

## 2019-03-09 LAB — GLUCOSE, CAPILLARY
Glucose-Capillary: 142 mg/dL — ABNORMAL HIGH (ref 70–99)
Glucose-Capillary: 148 mg/dL — ABNORMAL HIGH (ref 70–99)
Glucose-Capillary: 187 mg/dL — ABNORMAL HIGH (ref 70–99)
Glucose-Capillary: 124 mg/dL — ABNORMAL HIGH (ref 70–99)

## 2019-03-09 LAB — HEMOGLOBIN: Hemoglobin: 7.6 g/dL — ABNORMAL LOW (ref 13.0–17.0)

## 2019-03-09 LAB — PREPARE RBC (CROSSMATCH)

## 2019-03-09 LAB — SARS CORONAVIRUS 2 (TAT 6-24 HRS): SARS Coronavirus 2: NEGATIVE

## 2019-03-09 MED ORDER — FUROSEMIDE 10 MG/ML IJ SOLN
20.0000 mg | Freq: Once | INTRAMUSCULAR | Status: AC
  Administered 2019-03-09: 20 mg via INTRAVENOUS
  Filled 2019-03-09: qty 4

## 2019-03-09 MED ORDER — SODIUM CHLORIDE 0.9% IV SOLUTION: Freq: Once | INTRAVENOUS | Status: AC

## 2019-03-09 NOTE — Progress Notes (Signed)
Patient ID: Arthur [name], male   DOB: 12-16-1933, 86 y.o.   MRN: [id number] [facility] PROGRESS NOTE  Arthur [name] JJO:[id number] DOB: 06-30-1933 DOA: 03/04/2019 PCP: Baxter Hire, MD  HPI/Subjective: Patient states he always has a cough and shortness of breath.  Feels fatigued.  Objective: Vitals:   03/09/19 0438 03/09/19 1136  BP: (!) 142/52 (!) 130/55  Pulse: 72 67  Resp: 16 20  Temp: 98 F (36.7 C) 98.1 F (36.7 C)  SpO2: 99% 95%    Filed Weights   03/07/19 0500 03/08/19 0526 03/09/19 0438  Weight: 89.1 kg 89.7 kg 90.6 kg    ROS: Review of Systems  Constitutional: Positive for malaise/fatigue.  Eyes: Negative for blurred vision.  Respiratory: Positive for cough and shortness of breath.   Cardiovascular: Negative for chest pain.  Gastrointestinal: Negative for abdominal pain.  Genitourinary: Negative for dysuria.  Musculoskeletal: Negative for joint pain.  Neurological: Negative for dizziness.   Exam: Physical Exam  Constitutional: He is oriented to person, place, and time.  HENT:  Nose: No mucosal edema.  Mouth/Throat: No oropharyngeal exudate or posterior oropharyngeal edema.  Eyes: Conjunctivae and lids are normal.  Neck: Carotid bruit is not present.  Cardiovascular: S1 normal and S2 normal. Exam reveals no gallop.  No murmur heard. Respiratory: No respiratory distress. He has decreased breath sounds in the right lower field and the left lower field. He has no wheezes. He has no rhonchi. He has no rales.  GI: Soft. Bowel sounds are normal. There is no abdominal tenderness.  Musculoskeletal:     Right ankle: Swelling present.     Left ankle: Swelling present.  Lymphadenopathy:    He has no cervical adenopathy.  Neurological: He is alert and oriented to person, place, and time. No cranial nerve deficit.  Skin: Skin is warm. No rash noted. Nails show no clubbing.  Psychiatric: He has a normal mood and affect.      Data Reviewed: Basic Metabolic  Panel: Recent Labs  Lab 03/04/19 1220 03/05/19 0820 03/06/19 0542 03/07/19 0722 03/08/19 0959  NA 133* 133* 131* 132* 131*  K 3.3* 3.5 3.3* 3.8 3.7  CL 95* 94* 92* 95* 91*  CO2 28 27 29 29 30   GLUCOSE 161* 104* 135* 151* 195*  BUN 18 18 29* 34* 36*  CREATININE 1.54* 1.57* 1.84* 1.94* 1.73*  CALCIUM 8.3* 8.2* 7.8* 8.1* 8.1*   Liver Function Tests: Recent Labs  Lab 03/04/19 1220  AST 9*  ALT 8  ALKPHOS 73  BILITOT 0.8  PROT 6.9  ALBUMIN 3.3*   Recent Labs  Lab 03/04/19 1220  LIPASE 24   CBC: Recent Labs  Lab 03/04/19 1220 03/04/19 1220 03/05/19 0331 03/05/19 0331 03/05/19 1701 03/06/19 0542 03/07/19 0722 03/08/19 0959 03/09/19 0513  WBC 7.6  --  6.5  --   --  8.5  --   --   --   NEUTROABS 5.4  --   --   --   --   --   --   --   --   HGB 7.4*   < > 5.9*   < > 7.1* 7.7* 7.6* 6.9* 7.6*  HCT 23.3*  --  18.3*  --  22.3* 23.7*  --   --   --   MCV 91.4  --  91.5  --   --  88.8  --   --   --   PLT 366  --  274  --   --  288  --   --   --    < > = values in this interval not displayed.   BNP (last 3 results) Recent Labs    07/06/18 1613  BNP 132.0*     CBG: Recent Labs  Lab 03/08/19 1200 03/08/19 1651 03/08/19 2156 03/09/19 0749 03/09/19 1137  GLUCAP 191* 297* 183* 142* 148*    Recent Results (from the past 240 hour(s))  SARS CORONAVIRUS 2 (TAT 6-24 HRS) Nasopharyngeal Nasopharyngeal Swab     Status: None   Collection Time: 03/04/19  3:40 PM   Specimen: Nasopharyngeal Swab  Result Value Ref Range Status   SARS Coronavirus 2 NEGATIVE NEGATIVE Final    Comment: (NOTE) SARS-CoV-2 target nucleic acids are NOT DETECTED. The SARS-CoV-2 RNA is generally detectable in upper and lower respiratory specimens during the acute phase of infection. Negative results do not preclude SARS-CoV-2 infection, do not rule out co-infections with other pathogens, and should not be used as the sole basis for treatment or other patient management decisions. Negative  results must be combined with clinical observations, patient history, and epidemiological information. The expected result is Negative. Fact Sheet for Patients: SugarRoll.be Fact Sheet for Healthcare Providers: https://www.woods-mathews.com/ This test is not yet approved or cleared by the [place] FDA and  has been authorized for detection and/or diagnosis of SARS-CoV-2 by FDA under an Emergency Use Authorization (EUA). This EUA will remain  in effect (meaning this test can be used) for the duration of the COVID-19 declaration under Section 56 4(b)(1) of the Act, 21 U.S.C. section 360bbb-3(b)(1), unless the authorization is terminated or revoked sooner. Performed at [facility], [place] [address] [place], [place] [address]   Body fluid culture     Status: None   Collection Time: 03/05/19 10:12 AM   Specimen: PATH Cytology Pleural fluid  Result Value Ref Range Status   Specimen Description   Final    PLEURAL Performed at [facility], [address] [place], [place] [address]    Special Requests   Final    NONE Performed at [facility], [address] [place], [place] [address]    Gram Stain   Final    RARE WBC PRESENT, PREDOMINANTLY MONONUCLEAR NO ORGANISMS SEEN    Culture   Final    NO GROWTH 3 DAYS Performed at [facility], [place] [address], [place], [place] [address]    Report Status 03/08/2019 FINAL  Final     Scheduled Meds: . sodium chloride   Intravenous Once  . amLODipine  2.5 mg Oral Daily  . ascorbic acid  500 mg Oral QHS  . docusate sodium  100 mg Oral BID  . furosemide  20 mg Intravenous Once  . insulin aspart  0-5 Units Subcutaneous QHS  . insulin aspart  0-9 Units Subcutaneous TID WC  . insulin detemir  4 Units Subcutaneous QHS  . metoCLOPramide  5 mg Oral TID AC & HS  . polyethylene glycol  17 g Oral QODAY  . potassium chloride  10 mEq Oral QPM  . pravastatin  10 mg Oral  QHS  . sodium chloride flush  3 mL Intravenous Q12H  . tamsulosin  0.4 mg Oral Daily  . tiotropium  18 mcg Inhalation Daily  . torsemide  20 mg Oral Q M,W,F   Continuous Infusions: . sodium chloride      Assessment/Plan:  1. Anemia of chronic kidney disease.  I did a rectal exam 03/09/2019 and  guaiac and the stool was brown and guaiac negative.  Will transfuse 1 unit of packed red blood cells today on a hemoglobin of 7.6 to try to get his counts as high as possible prior to discharge.  Patient hesitant on GI work-up.  Received Aranesp while here.  Follow-up with [name] as outpatient. 2. Right pleural effusion status post thoracentesis 03/05/2019 and 600 mL taken off.  3. Chronic kidney disease stage IIIa 4. Acute on chronic respiratory failure with hypoxia.  Patient had his oxygen on this morning. 5. Type 2 diabetes mellitus with chronic kidney disease stage IIIa.  Last hemoglobin A1c 6.7.  Patient on low-dose Levemir at night with sliding scale 6. Essential hypertension on amlodipine 7. GERD on Protonix 8. COPD.  Continue inhalers 9. BPH on Flomax 10. Hyperlipidemia unspecified on Pravachol 11. Hypokalemia.  Replaced. 12. Weakness.  Physical therapy recommended rehab.   Code Status:     Code Status Orders  (From admission, onward)         Start     Ordered   03/04/19 1544  Do not attempt resuscitation (DNR)  Continuous    Question Answer Comment  In the event of cardiac or respiratory ARREST Do not call a "code blue"   In the event of cardiac or respiratory ARREST Do not perform Intubation, CPR, defibrillation or ACLS   In the event of cardiac or respiratory ARREST Use medication by any route, position, wound care, and other measures to relive pain and suffering. May use oxygen, suction and manual treatment of airway obstruction as needed for comfort.      03/04/19 1543        Code Status History    Date Active Date Inactive Code Status Order ID Comments User Context    02/18/2019 1832 02/20/2019 2120 DNR [id number]  [name], MD Inpatient   12/24/2018 1730 12/24/2018 2132 Full Code [id number]  [name], MD Inpatient   10/27/2018 2342 11/05/2018 1711 DNR [id number]  Christel Mormon, MD ED   10/27/2018 2342 10/27/2018 2342 Full Code [id number]  Mansy, Arvella Merles, MD ED   08/07/2018 2329 08/09/2018 1634 DNR [id number]  Lance Coon, MD Inpatient   07/25/2018 0955 07/26/2018 1908 DNR [id number]  Bettey Costa, MD Inpatient   07/25/2018 0138 07/25/2018 0954 DNR [id number]  Lance Coon, MD Inpatient   07/06/2018 2054 07/10/2018 1959 DNR [id number]  Henreitta Leber, MD Inpatient   06/27/2018 1150 07/05/2018 1757 DNR [id number]  Lang Snow, NP Inpatient   06/09/2018 1350 06/14/2018 1730 DNR [id number]  Lang Snow, NP ED   05/23/2018 0919 05/28/2018 2024 DNR [id number]  Demetrios Loll, MD Inpatient   05/23/2018 0104 05/23/2018 0918 Full Code [id number]  Mayer Camel, NP Inpatient   Advance Care Planning Activity     Family Communication: Spoke with patient's daughter on the phone and gave update. Disposition Plan: Insurance authorization needs to occur prior to discharge to rehab.  We will repeat Covid test today so I will have results for discharge to rehab tomorrow.  Consultants:  Hematology  Time spent: 26 minutes  [place]

## 2019-03-09 NOTE — Progress Notes (Signed)
Physical Therapy Treatment Patient Details Name: Arthur [name] MRN: [id number] DOB: 1933/05/15 Today's Date: 03/09/2019    History of Present Illness Pt is an 86 y/o male with PMH including anemia, CHF, CKD, COPD, DM, 2 L home O2, HTN, L2 and L3 kyphoplasty, dyspnea when active. Pt underwent L2 repeat kyphoplasty 02/18/19 secondary closed wedge fx of L2. Pt presented to ED with abdominal pain on 03/04/2019. Pt found to have pleural effusion now s/p thorcentesis on 03/05/19 and symptomatic anemia, now s/p 2 transfusions, and stool guaiac pending.    PT Comments    Patient received in bed, agrees to PT session. Patient requires mod assist with all bed mobility. Limited initiation of movement. Performed bed exercises first to assist with limbering up in preparation to get OOB. Patient requires mod/max assist to get positioned and balanced in sitting on edge of bed. Requires min assist to perform sit to stand transfers from elevated surface. He is able to take a few steps from bed to recliner with cues for safe use of DME,  Patient will continue to benefit from skilled PT to improve strength and independence with mobility.     Follow Up Recommendations  SNF     Equipment Recommendations  None recommended by PT    Recommendations for Other Services       Precautions / Restrictions Precautions Precautions: Fall Restrictions Weight Bearing Restrictions: No    Mobility  Bed Mobility Overal bed mobility: Needs Assistance Bed Mobility: Rolling;Sidelying to Sit;Supine to Sit Rolling: Mod assist Sidelying to sit: Mod assist Supine to sit: Mod assist;HOB elevated     General bed mobility comments: patient does not really attempt to move without assistance.  Transfers Overall transfer level: Needs assistance Equipment used: Rolling walker (2 wheeled) Transfers: Sit to/from Stand Sit to Stand: Min assist;From elevated surface            Ambulation/Gait Ambulation/Gait assistance: Min  guard Gait Distance (Feet): 5 Feet Assistive device: Rolling walker (2 wheeled) Gait Pattern/deviations: Step-to pattern;Decreased step length - right;Decreased step length - left;Trunk flexed Gait velocity: decreased   General Gait Details: patient reports back pain with mobility. Trying to sit prematurely requiring cues to continue using walker until he gets closer to the recliner.   Stairs             Wheelchair Mobility    Modified Rankin (Stroke Patients Only)       Balance Overall balance assessment: Needs assistance Sitting-balance support: Feet supported;Bilateral upper extremity supported Sitting balance-Leahy Scale: Poor Sitting balance - Comments: Min to Mod A to maintain upright sitting position secondary to back pain per pt Postural control: Right lateral lean Standing balance support: Bilateral upper extremity supported;During functional activity Standing balance-Leahy Scale: Fair Standing balance comment: reliant on RW for support with standing activities                            Cognition Arousal/Alertness: Awake/alert Behavior During Therapy: WFL for tasks assessed/performed Overall Cognitive Status: No family/caregiver present to determine baseline cognitive functioning Area of Impairment: Memory;Safety/judgement                     Memory: Decreased short-term memory;Decreased recall of precautions   Safety/Judgement: Decreased awareness of safety            Exercises Total Joint Exercises Ankle Circles/Pumps: AROM;10 reps;Both;Supine Heel Slides: AROM;Both;5 reps;Supine Hip ABduction/ADduction: AAROM;5 reps;Both;Supine    General Comments  Pertinent Vitals/Pain Faces Pain Scale: Hurts little more Pain Location: reports no pain at rest, moaning with mobility Pain Descriptors / Indicators: Discomfort Pain Intervention(s): Monitored during session;Repositioned    Home Living                       Prior Function            PT Goals (current goals can now be found in the care plan section) Acute Rehab PT Goals Patient Stated Goal: To get stronger PT Goal Formulation: With patient Time For Goal Achievement: 03/19/19 Potential to Achieve Goals: Fair Progress towards PT goals: Progressing toward goals    Frequency    Min 2X/week      PT Plan Current plan remains appropriate    Co-evaluation              AM-PAC PT "6 Clicks" Mobility   Outcome Measure  Help needed turning from your back to your side while in a flat bed without using bedrails?: A Lot Help needed moving from lying on your back to sitting on the side of a flat bed without using bedrails?: A Lot Help needed moving to and from a bed to a chair (including a wheelchair)?: A Lot Help needed standing up from a chair using your arms (e.g., wheelchair or bedside chair)?: A Lot Help needed to walk in hospital room?: A Lot Help needed climbing 3-5 steps with a railing? : Total 6 Click Score: 11    End of Session Equipment Utilized During Treatment: Gait belt Activity Tolerance: Patient limited by pain Patient left: in chair;with call bell/phone within reach;with chair alarm set Nurse Communication: Mobility status PT Visit Diagnosis: Muscle weakness (generalized) (M62.81);Difficulty in walking, not elsewhere classified (R26.2);Pain Pain - part of body: (back)     Time: 2993-7169 PT Time Calculation (min) (ACUTE ONLY): 17 min  Charges:  $Therapeutic Activity: 8-22 mins                     Lennart Gladish, PT, GCS 03/09/19,2:04 PM

## 2019-03-10 DIAGNOSIS — J690 Pneumonitis due to inhalation of food and vomit: Secondary | ICD-10-CM | POA: Diagnosis not present

## 2019-03-10 DIAGNOSIS — M4856XD Collapsed vertebra, not elsewhere classified, lumbar region, subsequent encounter for fracture with routine healing: Secondary | ICD-10-CM | POA: Diagnosis not present

## 2019-03-10 DIAGNOSIS — I509 Heart failure, unspecified: Secondary | ICD-10-CM | POA: Diagnosis not present

## 2019-03-10 DIAGNOSIS — R531 Weakness: Secondary | ICD-10-CM | POA: Diagnosis not present

## 2019-03-10 DIAGNOSIS — N184 Chronic kidney disease, stage 4 (severe): Secondary | ICD-10-CM | POA: Diagnosis not present

## 2019-03-10 DIAGNOSIS — E119 Type 2 diabetes mellitus without complications: Secondary | ICD-10-CM | POA: Diagnosis not present

## 2019-03-10 DIAGNOSIS — J441 Chronic obstructive pulmonary disease with (acute) exacerbation: Secondary | ICD-10-CM | POA: Diagnosis not present

## 2019-03-10 DIAGNOSIS — E785 Hyperlipidemia, unspecified: Secondary | ICD-10-CM | POA: Diagnosis not present

## 2019-03-10 DIAGNOSIS — N189 Chronic kidney disease, unspecified: Secondary | ICD-10-CM | POA: Diagnosis not present

## 2019-03-10 DIAGNOSIS — Z515 Encounter for palliative care: Secondary | ICD-10-CM | POA: Diagnosis not present

## 2019-03-10 DIAGNOSIS — J439 Emphysema, unspecified: Secondary | ICD-10-CM | POA: Diagnosis not present

## 2019-03-10 DIAGNOSIS — Z794 Long term (current) use of insulin: Secondary | ICD-10-CM | POA: Diagnosis not present

## 2019-03-10 DIAGNOSIS — M545 Low back pain: Secondary | ICD-10-CM | POA: Diagnosis not present

## 2019-03-10 DIAGNOSIS — R112 Nausea with vomiting, unspecified: Secondary | ICD-10-CM | POA: Diagnosis not present

## 2019-03-10 DIAGNOSIS — I10 Essential (primary) hypertension: Secondary | ICD-10-CM | POA: Diagnosis not present

## 2019-03-10 DIAGNOSIS — E1122 Type 2 diabetes mellitus with diabetic chronic kidney disease: Secondary | ICD-10-CM | POA: Diagnosis not present

## 2019-03-10 DIAGNOSIS — J9611 Chronic respiratory failure with hypoxia: Secondary | ICD-10-CM | POA: Diagnosis not present

## 2019-03-10 DIAGNOSIS — M2550 Pain in unspecified joint: Secondary | ICD-10-CM | POA: Diagnosis not present

## 2019-03-10 DIAGNOSIS — N183 Chronic kidney disease, stage 3 unspecified: Secondary | ICD-10-CM | POA: Diagnosis not present

## 2019-03-10 DIAGNOSIS — M47816 Spondylosis without myelopathy or radiculopathy, lumbar region: Secondary | ICD-10-CM | POA: Diagnosis not present

## 2019-03-10 DIAGNOSIS — Z79899 Other long term (current) drug therapy: Secondary | ICD-10-CM | POA: Diagnosis not present

## 2019-03-10 DIAGNOSIS — J449 Chronic obstructive pulmonary disease, unspecified: Secondary | ICD-10-CM | POA: Diagnosis not present

## 2019-03-10 DIAGNOSIS — I959 Hypotension, unspecified: Secondary | ICD-10-CM | POA: Diagnosis not present

## 2019-03-10 DIAGNOSIS — N1831 Chronic kidney disease, stage 3a: Secondary | ICD-10-CM | POA: Diagnosis not present

## 2019-03-10 DIAGNOSIS — Z7401 Bed confinement status: Secondary | ICD-10-CM | POA: Diagnosis not present

## 2019-03-10 DIAGNOSIS — K219 Gastro-esophageal reflux disease without esophagitis: Secondary | ICD-10-CM | POA: Diagnosis not present

## 2019-03-10 DIAGNOSIS — R109 Unspecified abdominal pain: Secondary | ICD-10-CM | POA: Diagnosis not present

## 2019-03-10 DIAGNOSIS — E1121 Type 2 diabetes mellitus with diabetic nephropathy: Secondary | ICD-10-CM | POA: Diagnosis not present

## 2019-03-10 DIAGNOSIS — Z87891 Personal history of nicotine dependence: Secondary | ICD-10-CM | POA: Diagnosis not present

## 2019-03-10 DIAGNOSIS — I130 Hypertensive heart and chronic kidney disease with heart failure and stage 1 through stage 4 chronic kidney disease, or unspecified chronic kidney disease: Secondary | ICD-10-CM | POA: Diagnosis not present

## 2019-03-10 DIAGNOSIS — D649 Anemia, unspecified: Secondary | ICD-10-CM | POA: Diagnosis not present

## 2019-03-10 DIAGNOSIS — J90 Pleural effusion, not elsewhere classified: Secondary | ICD-10-CM | POA: Diagnosis not present

## 2019-03-10 DIAGNOSIS — D631 Anemia in chronic kidney disease: Secondary | ICD-10-CM | POA: Diagnosis not present

## 2019-03-10 DIAGNOSIS — W19XXXA Unspecified fall, initial encounter: Secondary | ICD-10-CM | POA: Diagnosis not present

## 2019-03-10 DIAGNOSIS — J431 Panlobular emphysema: Secondary | ICD-10-CM | POA: Diagnosis not present

## 2019-03-10 DIAGNOSIS — G8929 Other chronic pain: Secondary | ICD-10-CM | POA: Diagnosis not present

## 2019-03-10 DIAGNOSIS — N400 Enlarged prostate without lower urinary tract symptoms: Secondary | ICD-10-CM | POA: Diagnosis not present

## 2019-03-10 LAB — BASIC METABOLIC PANEL
Anion gap: 5 (ref 5–15)
BUN: 27 mg/dL — ABNORMAL HIGH (ref 8–23)
CO2: 32 mmol/L (ref 22–32)
Calcium: 8.3 mg/dL — ABNORMAL LOW (ref 8.9–10.3)
Chloride: 93 mmol/L — ABNORMAL LOW (ref 98–111)
Creatinine, Ser: 1.59 mg/dL — ABNORMAL HIGH (ref 0.61–1.24)
GFR calc Af Amer: 46 mL/min — ABNORMAL LOW (ref >60)
GFR calc non Af Amer: 40 mL/min — ABNORMAL LOW (ref >60)
Glucose, Bld: 116 mg/dL — ABNORMAL HIGH (ref 70–99)
Potassium: 3.9 mmol/L (ref 3.5–5.1)
Sodium: 130 mmol/L — ABNORMAL LOW (ref 135–145)

## 2019-03-10 LAB — RESPIRATORY PANEL BY RT PCR (FLU A&B, COVID)
Influenza A by PCR: NEGATIVE
Influenza B by PCR: NEGATIVE
SARS Coronavirus 2 by RT PCR: NEGATIVE

## 2019-03-10 LAB — HEMOGLOBIN: Hemoglobin: 8.9 g/dL — ABNORMAL LOW (ref 13.0–17.0)

## 2019-03-10 LAB — BPAM RBC
Blood Product Expiration Date: 202103302359
ISSUE DATE / TIME: 202103071544
Unit Type and Rh: 6200

## 2019-03-10 LAB — TYPE AND SCREEN
ABO/RH(D): A POS
Antibody Screen: NEGATIVE
Unit division: 0

## 2019-03-10 LAB — GLUCOSE, CAPILLARY
Glucose-Capillary: 118 mg/dL — ABNORMAL HIGH (ref 70–99)
Glucose-Capillary: 173 mg/dL — ABNORMAL HIGH (ref 70–99)

## 2019-03-10 NOTE — Discharge Instructions (Signed)
Anemia  Anemia is a condition in which you do not have enough red blood cells or hemoglobin. Hemoglobin is a substance in red blood cells that carries oxygen. When you do not have enough red blood cells or hemoglobin (are anemic), your body cannot get enough oxygen and your organs may not work properly. As a result, you may feel very tired or have other problems. What are the causes? Common causes of anemia include:  Excessive bleeding. Anemia can be caused by excessive bleeding inside or outside the body, including bleeding from the intestine or from periods in women.  Poor nutrition.  Long-lasting (chronic) kidney, thyroid, and liver disease.  Bone marrow disorders.  Cancer and treatments for cancer.  HIV (human immunodeficiency virus) and AIDS (acquired immunodeficiency syndrome).  Treatments for HIV and AIDS.  Spleen problems.  Blood disorders.  Infections, medicines, and autoimmune disorders that destroy red blood cells. What are the signs or symptoms? Symptoms of this condition include:  Minor weakness.  Dizziness.  Headache.  Feeling heartbeats that are irregular or faster than normal (palpitations).  Shortness of breath, especially with exercise.  Paleness.  Cold sensitivity.  Indigestion.  Nausea.  Difficulty sleeping.  Difficulty concentrating. Symptoms may occur suddenly or develop slowly. If your anemia is mild, you may not have symptoms. How is this diagnosed? This condition is diagnosed based on:  Blood tests.  Your medical history.  A physical exam.  Bone marrow biopsy. Your health care provider may also check your stool (feces) for blood and may do additional testing to look for the cause of your bleeding. You may also have other tests, including:  Imaging tests, such as a CT scan or MRI.  Endoscopy.  Colonoscopy. How is this treated? Treatment for this condition depends on the cause. If you continue to lose a lot of blood, you may  need to be treated at a hospital. Treatment may include:  Taking supplements of iron, vitamin M08, or folic acid.  Taking a hormone medicine (erythropoietin) that can help to stimulate red blood cell growth.  Having a blood transfusion. This may be needed if you lose a lot of blood.  Making changes to your diet.  Having surgery to remove your spleen. Follow these instructions at home:  Take over-the-counter and prescription medicines only as told by your health care provider.  Take supplements only as told by your health care provider.  Follow any diet instructions that you were given.  Keep all follow-up visits as told by your health care provider. This is important. Contact a health care provider if:  You develop new bleeding anywhere in the body. Get help right away if:  You are very weak.  You are short of breath.  You have pain in your abdomen or chest.  You are dizzy or feel faint.  You have trouble concentrating.  You have bloody or black, tarry stools.  You vomit repeatedly or you vomit up blood. Summary  Anemia is a condition in which you do not have enough red blood cells or enough of a substance in your red blood cells that carries oxygen (hemoglobin).  Symptoms may occur suddenly or develop slowly.  If your anemia is mild, you may not have symptoms.  This condition is diagnosed with blood tests as well as a medical history and physical exam. Other tests may be needed.  Treatment for this condition depends on the cause of the anemia. This information is not intended to replace advice given to you by  your health care provider. Make sure you discuss any questions you have with your health care provider. Document Revised: 12/01/2016 Document Reviewed: 01/21/2016 Elsevier Patient Education  [place].

## 2019-03-10 NOTE — Progress Notes (Signed)
03/10/2019  Arthur [name] to be D/C'd Skilled nursing facility per MD order.  Discussed prescriptions and follow up appointments with the patient. Prescriptions given to patient, medication list explained in detail. Pt verbalized understanding.  Allergies as of 03/10/2019      Reactions   Codeine Other (See Comments)   Constipation      Medication List    TAKE these medications   acetaminophen 325 MG tablet Commonly known as: TYLENOL Take 2 tablets (650 mg total) by mouth every 6 (six) hours as needed for mild pain (or Fever >/= 101).   albuterol 108 (90 Base) MCG/ACT inhaler Commonly known as: VENTOLIN HFA Inhale 2 puffs into the lungs every 6 (six) hours as needed for wheezing or shortness of breath.   amLODipine 2.5 MG tablet Commonly known as: NORVASC Take 2.5 mg by mouth daily.   ascorbic acid 500 MG tablet Commonly known as: VITAMIN C Take 500 mg by mouth at bedtime.   docusate sodium 100 MG capsule Commonly known as: COLACE Take 1 capsule (100 mg total) by mouth 2 (two) times daily.   HYDROcodone-acetaminophen 5-325 MG tablet Commonly known as: Norco Take 1 tablet by mouth every 6 (six) hours as needed for moderate pain or severe pain.   insulin aspart 100 UNIT/ML injection Commonly known as: novoLOG 4 units prior to meals if sugars above 200 What changed:   how much to take  how to take this  when to take this  additional instructions   Levemir 100 UNIT/ML injection Generic drug: insulin detemir Inject 0.04 mLs (4 Units total) into the skin at bedtime. What changed:   how much to take  when to take this   metoCLOPramide 5 MG tablet Commonly known as: REGLAN Take 5 mg by mouth 4 (four) times daily -  before meals and at bedtime.   pantoprazole 40 MG tablet Commonly known as: Protonix Take 1 tablet (40 mg total) by mouth daily.   polyethylene glycol powder 17 GM/SCOOP powder Commonly known as: GLYCOLAX/MIRALAX Take 17 g by mouth every other  day.   potassium chloride 10 MEQ tablet Commonly known as: KLOR-CON Take 10 mEq by mouth every evening.   pravastatin 10 MG tablet Commonly known as: PRAVACHOL Take 10 mg by mouth at bedtime.   promethazine 25 MG tablet Commonly known as: PHENERGAN Take 25 mg by mouth every 4 (four) hours as needed for nausea or vomiting.   tamsulosin 0.4 MG Caps capsule Commonly known as: FLOMAX Take 0.4 mg by mouth daily.   tiotropium 18 MCG inhalation capsule Commonly known as: SPIRIVA Place 18 mcg into inhaler and inhale daily.   torsemide 20 MG tablet Commonly known as: DEMADEX Take 20 mg by mouth every Monday, Wednesday, and Friday.       Vitals:   03/10/19 1105 03/10/19 1147  BP: (!) 155/56 (!) 159/57  Pulse: 65 66  Resp: (!) 24 18  Temp: 98 F (36.7 C) 98.3 F (36.8 C)  SpO2: 100% 100%    Skin clean, dry and intact without evidence of skin break down, no evidence of skin tears noted. IV catheter discontinued intact. Site without signs and symptoms of complications. Dressing and pressure applied. Pt denies pain at this time. No complaints noted.  An After Visit Summary was printed and given to the patient. Patient escorted and D/C to [facility] via EMS [facility] [place]

## 2019-03-10 NOTE — Progress Notes (Addendum)
Daily Progress Note   Patient Name: Arthur [name]       Date: 03/10/2019 DOB: [birth year]  Age: 86 y.o. MRN#: [id number] Attending Physician: Loletha Grayer, MD Primary Care Physician: Baxter Hire, MD Admit Date: 03/04/2019  Reason for Consultation/Follow-up: Establishing goals of care  Subjective: Patient is resting in bed with eyes closed. He awakens answering questions with brief responses. He denies pain or needs. He is amenable with having palliative follow at his facility with further [place] conversations.   Length of Stay: 6  Current Medications: Scheduled Meds:  . amLODipine  2.5 mg Oral Daily  . ascorbic acid  500 mg Oral QHS  . docusate sodium  100 mg Oral BID  . insulin aspart  0-5 Units Subcutaneous QHS  . insulin aspart  0-9 Units Subcutaneous TID WC  . insulin detemir  4 Units Subcutaneous QHS  . metoCLOPramide  5 mg Oral TID AC & HS  . polyethylene glycol  17 g Oral QODAY  . potassium chloride  10 mEq Oral QPM  . pravastatin  10 mg Oral QHS  . sodium chloride flush  3 mL Intravenous Q12H  . tamsulosin  0.4 mg Oral Daily  . tiotropium  18 mcg Inhalation Daily  . torsemide  20 mg Oral Q M,W,F    Continuous Infusions: . sodium chloride      PRN Meds: sodium chloride, acetaminophen **OR** acetaminophen, albuterol, bisacodyl, ondansetron **OR** ondansetron (ZOFRAN) IV, promethazine, sodium chloride flush, traZODone  Physical Exam Pulmonary:     Effort: Pulmonary effort is normal.  Neurological:     Mental Status: He is alert.             Vital Signs: BP (!) 147/51 (BP Location: Left Arm)   Pulse 66   Temp 98 F (36.7 C) (Oral)   Resp 16   Ht 5\' 8"  (1.727 m)   Wt 88.8 kg   SpO2 96%   BMI 29.77 kg/m  SpO2: SpO2: 96 % O2 Device: O2 Device: Nasal  Cannula O2 Flow Rate: O2 Flow Rate (L/min): 2 L/min  Intake/output summary:   Intake/Output Summary (Last 24 hours) at 03/10/2019 1018 Last data filed at 03/10/2019 1009 Gross per 24 hour  Intake 635 ml  Output 650 ml  Net -15 ml   LBM: Last BM Date: 03/07/19  Baseline Weight: Weight: 93 kg Most recent weight: Weight: 88.8 kg       Palliative Assessment/Data:       Patient Active Problem List   Diagnosis Date Noted  . Weakness   . Stage 3a chronic kidney disease   . Constipation   . DNR (do not resuscitate) 03/05/2019  . Generalized abdominal pain   . Gastroesophageal reflux disease without esophagitis   . Status post thoracentesis   . Pleural effusion on right 03/04/2019  . S/P kyphoplasty 02/18/2019  . Goals of care, counseling/discussion 01/16/2019  . CKD (chronic kidney disease) stage 4, GFR 15-29 ml/min (HCC) 01/16/2019  . CKD stage 4 due to type 2 diabetes mellitus ([place])   . Closed compression fracture of second lumbar vertebra ([place])   . CKD stage 3 due to type 2 diabetes mellitus ([place])   . Chronic respiratory failure with hypoxia ([place])   . Benign prostatic hyperplasia without lower urinary tract symptoms   . History of anemia due to chronic kidney disease   . Anemia of chronic kidney failure 08/21/2018  . UTI (urinary tract infection) 08/07/2018  . Acute on chronic renal failure ([place]) 08/07/2018  . HLD (hyperlipidemia) 07/24/2018  . Acute on chronic respiratory failure with hypoxia ([place]) 07/06/2018  . Symptomatic anemia 06/27/2018  . GI bleeding 06/09/2018  . Sepsis secondary to UTI ([place]) 05/23/2018  . Cellulitis of right lower extremity 05/22/2018  . Diabetes ([place]) 10/24/2006  . OBESITY 10/24/2006  . Essential hypertension 10/24/2006  . ALLERGIC RHINITIS 10/24/2006  . COPD (chronic obstructive pulmonary disease) ([place]) 10/24/2006  . TOBACCO ABUSE, HX OF 10/24/2006    Palliative Care Assessment & Plan    Recommendations/Plan:  Continue to treat the  treatable. Recommend palliative to follow at D/C for further [place] conversations.    Code Status:    Code Status Orders  (From admission, onward)         Start     Ordered   03/04/19 1544  Do not attempt resuscitation (DNR)  Continuous    Question Answer Comment  In the event of cardiac or respiratory ARREST Do not call a "code blue"   In the event of cardiac or respiratory ARREST Do not perform Intubation, CPR, defibrillation or ACLS   In the event of cardiac or respiratory ARREST Use medication by any route, position, wound care, and other measures to relive pain and suffering. May use oxygen, suction and manual treatment of airway obstruction as needed for comfort.      03/04/19 1543        Code Status History    Date Active Date Inactive Code Status Order ID Comments User Context   02/18/2019 1832 02/20/2019 2120 DNR [id number]  [name], MD Inpatient   12/24/2018 1730 12/24/2018 2132 Full Code [id number]  [name], MD Inpatient   10/27/2018 2342 11/05/2018 1711 DNR [id number]  Christel Mormon, MD ED   10/27/2018 2342 10/27/2018 2342 Full Code [id number]  Mansy, Arvella Merles, MD ED   08/07/2018 2329 08/09/2018 1634 DNR [id number]  Lance Coon, MD Inpatient   07/25/2018 0955 07/26/2018 1908 DNR [id number]  Bettey Costa, MD Inpatient   07/25/2018 0138 07/25/2018 0954 DNR [id number]  Lance Coon, MD Inpatient   07/06/2018 2054 07/10/2018 1959 DNR [id number]  Henreitta Leber, MD Inpatient   06/27/2018 1150 07/05/2018 1757 DNR [id number]  Lang Snow, NP Inpatient   06/09/2018 1350 06/14/2018 1730 DNR [id number]  Lang Snow, NP ED   05/23/2018 [phone] 05/28/2018  2024 DNR [id number]  Demetrios Loll, MD Inpatient   05/23/2018 0104 05/23/2018 0918 Full Code [id number]  Mayer Camel, NP Inpatient   Advance Care Planning Activity       Prognosis:  < 6 months, poor PO intake, anemia.     Thank you for allowing the Palliative Medicine Team to assist in the care of this patient.   Total  Time 15 min Prolonged Time Billed no      Greater than 50%  of this time was spent counseling and coordinating care related to the above assessment and plan.  Asencion Gowda, NP  Please contact Palliative Medicine Team phone at [phone] for questions and concerns.

## 2019-03-10 NOTE — Progress Notes (Signed)
03/10/2019 12:37 PM  Called report to receiving nurse Levada Dy at [facility], where patient is discharging.

## 2019-03-10 NOTE — TOC Transition Note (Signed)
Transition of Care [facility]) - CM/SW Discharge Note   Patient Details  Name: Arthur [name] MRN: [id number] Date of Birth: [birth year]  [facility]) CM/SW Contact:  Magnus Ivan, LCSW Phone Number: 03/10/2019, 12:13 PM   Clinical Narrative:   Patient has orders to discharge today. Patient will go to [facility]. Insurance auth obtained by facility. COVID was negative. CSW called non-emergent EMS to arrange transfer. Informed Magda Paganini at [facility], RN, MD, and patient's daughter (left voicemail). No additional needs identified. CSW signing off.    Final next level of care: Skilled Nursing Facility Barriers to Discharge: Barriers Resolved   Patient Goals and CMS Choice        Discharge Placement              Patient chooses bed at: [facility] Patient to be transferred to facility by: EMS Name of family member notified: [name]- daughter Patient and family notified of of transfer: 03/10/19  Discharge Plan and Services                                     Social Determinants of Health (SDOH) Interventions     Readmission Risk Interventions Readmission Risk Prevention Plan 03/05/2019 11/05/2018 10/29/2018  Transportation Screening Complete Complete Complete  PCP or Specialist Appt within 5-7 Days - - -  PCP or Specialist Appt within 3-5 Days - - -  Home Care Screening - - -  Medication Review (RN CM) - - -  [place] or [place] Work Consult for [place] - - -  Medication Review Press photographer) Complete Complete Complete  PCP or Specialist appointment within 3-5 days of discharge Complete (No Data) -  [place] or Home Care Consult Complete - -  SW Recovery Care/Counseling Consult - - -  Palliative Care Screening - Not Applicable -  [place] - Complete -  Some recent data might be hidden

## 2019-03-10 NOTE — Discharge Summary (Addendum)
[name] NAME: Arthur [name]    MR#:  [id number]  DATE OF BIRTH:  Jul 15, 1933  DATE OF ADMISSION:  03/04/2019 ADMITTING PHYSICIAN: Max Sane, MD  DATE OF DISCHARGE: 03/10/2019  PRIMARY CARE PHYSICIAN: Baxter Hire, MD    ADMISSION DIAGNOSIS:  Shortness of breath [R06.02] Pleural effusion [J90] Aspiration pneumonia of right lower lobe, unspecified aspiration pneumonia type ([place]) [J69.0]  DISCHARGE DIAGNOSIS:  Active Problems:   Pleural effusion on right   DNR (do not resuscitate)   Generalized abdominal pain   Gastroesophageal reflux disease without esophagitis   Status post thoracentesis   Constipation   Stage 3a chronic kidney disease   Weakness   SECONDARY DIAGNOSIS:   Past Medical History:  Diagnosis Date  . Anemia of chronic kidney failure 08/21/2018  . Arthritis    lower back  . CHF (congestive heart failure) ([place])   . CKD (chronic kidney disease), stage III   . COPD (chronic obstructive pulmonary disease) ([place])   . Diabetes mellitus without complication ([place])    type 2  . Dyspnea    when active -wears oxygen 2L via [place]  . History of blood transfusion   . Hyperlipidemia   . Hypertension   . Requires continuous at home supplemental oxygen    2L via [place]:   1.  Anemia of chronic disease.  Patient was given a total of 4 units of packed red blood cells during the hospital course for symptomatic anemia.  Hemoglobin did come up to 8.9.  Patient was given a dose of Aranesp by [name] oncology.  Follow-up with [name] as outpatient for follow-up of counts and Aranesp injections.  The patient was guaiac negative.  The patient was also hesitant on any GI work-up. 2.  Right pleural effusion status post thoracentesis on 03/05/2019 and 600 mL taken off.  Culture is negative. 3.  Acute on chronic respiratory failure with hypoxia.  Patient on 2 L of oxygen chronically 4.  Type 2 diabetes mellitus with chronic  kidney disease stage IIIa.  Last hemoglobin A1c of 6.7.  Patient on low-dose Levemir and short acting insulin if sugars are high. 5.  Essential hypertension on amlodipine 6.  GERD on Protonix 7.  COPD.  Continue inhalers 8.  BPH on Flomax 9.  Hyperlipidemia unspecified on Pravachol 10.  Hypokalemia this was replaced during the hospital course 11.  Weakness.  Physical therapy recommended rehab 12.  Patient is a DO NOT RESUSCITATE 35. Palliative care to follow as outpatient.  DISCHARGE CONDITIONS:   Satisfactory  CONSULTS OBTAINED:  Treatment Team:  [name], MD  DRUG ALLERGIES:   Allergies  Allergen Reactions  . Codeine Other (See Comments)    Constipation    DISCHARGE MEDICATIONS:   Allergies as of 03/10/2019      Reactions   Codeine Other (See Comments)   Constipation      Medication List    TAKE these medications   acetaminophen 325 MG tablet Commonly known as: TYLENOL Take 2 tablets (650 mg total) by mouth every 6 (six) hours as needed for mild pain (or Fever >/= 101).   albuterol 108 (90 Base) MCG/ACT inhaler Commonly known as: VENTOLIN HFA Inhale 2 puffs into the lungs every 6 (six) hours as needed for wheezing or shortness of breath.   amLODipine 2.5 MG tablet Commonly known as: NORVASC Take 2.5 mg by mouth daily.   ascorbic acid 500  MG tablet Commonly known as: VITAMIN C Take 500 mg by mouth at bedtime.   docusate sodium 100 MG capsule Commonly known as: COLACE Take 1 capsule (100 mg total) by mouth 2 (two) times daily.   HYDROcodone-acetaminophen 5-325 MG tablet Commonly known as: Norco Take 1 tablet by mouth every 6 (six) hours as needed for moderate pain or severe pain.   insulin aspart 100 UNIT/ML injection Commonly known as: novoLOG 4 units prior to meals if sugars above 200 What changed:   how much to take  how to take this  when to take this  additional instructions   Levemir 100 UNIT/ML injection Generic drug: insulin  detemir Inject 0.04 mLs (4 Units total) into the skin at bedtime. What changed:   how much to take  when to take this   metoCLOPramide 5 MG tablet Commonly known as: REGLAN Take 5 mg by mouth 4 (four) times daily -  before meals and at bedtime.   pantoprazole 40 MG tablet Commonly known as: Protonix Take 1 tablet (40 mg total) by mouth daily.   polyethylene glycol powder 17 GM/SCOOP powder Commonly known as: GLYCOLAX/MIRALAX Take 17 g by mouth every other day.   potassium chloride 10 MEQ tablet Commonly known as: KLOR-CON Take 10 mEq by mouth every evening.   pravastatin 10 MG tablet Commonly known as: PRAVACHOL Take 10 mg by mouth at bedtime.   promethazine 25 MG tablet Commonly known as: PHENERGAN Take 25 mg by mouth every 4 (four) hours as needed for nausea or vomiting.   tamsulosin 0.4 MG Caps capsule Commonly known as: FLOMAX Take 0.4 mg by mouth daily.   tiotropium 18 MCG inhalation capsule Commonly known as: SPIRIVA Place 18 mcg into inhaler and inhale daily.   torsemide 20 MG tablet Commonly known as: DEMADEX Take 20 mg by mouth every Monday, Wednesday, and Friday.        DISCHARGE INSTRUCTIONS:   Follow-up with team at rehab 1 day Follow-up [name] hematology 1 week  If you experience worsening of your admission symptoms, develop shortness of breath, life threatening emergency, suicidal or homicidal thoughts you must seek medical attention immediately by calling 911 or calling your MD immediately  if symptoms less severe.  You Must read complete instructions/literature along with all the possible adverse reactions/side effects for all the Medicines you take and that have been prescribed to you. Take any new Medicines after you have completely understood and accept all the possible adverse reactions/side effects.   Please note  You were cared for by a hospitalist during your hospital stay. If you have any questions about your discharge medications or  the care you received while you were in the hospital after you are discharged, you can call the unit and asked to speak with the hospitalist on call if the hospitalist that took care of you is not available. Once you are discharged, your primary care physician will handle any further medical issues. Please note that NO REFILLS for any discharge medications will be authorized once you are discharged, as it is imperative that you return to your primary care physician (or establish a relationship with a primary care physician if you do not have one) for your aftercare needs so that they can reassess your need for medications and monitor your lab values.    Today   CHIEF COMPLAINT:   Chief Complaint  Patient presents with  . Abdominal Pain    HISTORY OF PRESENT ILLNESS:  [name]  is a 86 y.o. male came in with abdominal pain   VITAL SIGNS:  Blood pressure (!) 147/51, pulse 66, temperature 98 F (36.7 C), temperature source Oral, resp. rate 16, height 5\' 8"  (1.727 m), weight 88.8 kg, SpO2 96 %.   PHYSICAL EXAMINATION:  GENERAL:  86 y.o.-year-old patient lying in the bed with no acute distress.  EYES: Pupils equal, round, reactive to light and accommodation. No scleral icterus. Extraocular muscles intact.  HEENT: Head atraumatic, normocephalic. Oropharynx and nasopharynx clear.  NECK:  Supple, no jugular venous distention. No thyroid enlargement, no tenderness.  LUNGS: Decreased breath sounds bilaterally, no wheezing, rales,rhonchi or crepitation. No use of accessory muscles of respiration.  CARDIOVASCULAR: S1, S2 normal. No murmurs, rubs, or gallops.  ABDOMEN: Soft, non-tender, non-distended.   EXTREMITIES: Trace pedal edema.no cyanosis, or clubbing.  NEUROLOGIC: Cranial nerves II through XII are intact.  Sensation intact. Gait not checked.  PSYCHIATRIC: The patient is alert and answers questions appropriately.  SKIN: No obvious rash, lesion, or ulcer.   DATA REVIEW:   CBC Recent  Labs  Lab 03/06/19 0542 03/07/19 0722 03/10/19 0538  WBC 8.5  --   --   HGB 7.7*   < > 8.9*  HCT 23.7*  --   --   PLT 288  --   --    < > = values in this interval not displayed.    Chemistries  Recent Labs  Lab 03/04/19 1220 03/05/19 0820 03/10/19 0538  NA 133*   < > 130*  K 3.3*   < > 3.9  CL 95*   < > 93*  CO2 28   < > 32  GLUCOSE 161*   < > 116*  BUN 18   < > 27*  CREATININE 1.54*   < > 1.59*  CALCIUM 8.3*   < > 8.3*  AST 9*  --   --   ALT 8  --   --   ALKPHOS 73  --   --   BILITOT 0.8  --   --    < > = values in this interval not displayed.    Microbiology Results  Results for orders placed or performed during the hospital encounter of 03/04/19  SARS CORONAVIRUS 2 (TAT 6-24 HRS) Nasopharyngeal Nasopharyngeal Swab     Status: None   Collection Time: 03/04/19  3:40 PM   Specimen: Nasopharyngeal Swab  Result Value Ref Range Status   SARS Coronavirus 2 NEGATIVE NEGATIVE Final    Comment: (NOTE) SARS-CoV-2 target nucleic acids are NOT DETECTED. The SARS-CoV-2 RNA is generally detectable in upper and lower respiratory specimens during the acute phase of infection. Negative results do not preclude SARS-CoV-2 infection, do not rule out co-infections with other pathogens, and should not be used as the sole basis for treatment or other patient management decisions. Negative results must be combined with clinical observations, patient history, and epidemiological information. The expected result is Negative. Fact Sheet for Patients: SugarRoll.be Fact Sheet for Healthcare Providers: https://www.woods-mathews.com/ This test is not yet approved or cleared by the [place] FDA and  has been authorized for detection and/or diagnosis of SARS-CoV-2 by FDA under an Emergency Use Authorization (EUA). This EUA will remain  in effect (meaning this test can be used) for the duration of the COVID-19 declaration under Section 56  4(b)(1) of the Act, 21 U.S.C. section 360bbb-3(b)(1), unless the authorization is terminated or revoked sooner. Performed at [facility], [place] [address] [place], [place] [address]  Body fluid culture     Status: None   Collection Time: 03/05/19 10:12 AM   Specimen: PATH Cytology Pleural fluid  Result Value Ref Range Status   Specimen Description   Final    PLEURAL Performed at [facility], [address] [place], [place] [address]    Special Requests   Final    NONE Performed at [facility], [address] [place], [place] [address]    Gram Stain   Final    RARE WBC PRESENT, PREDOMINANTLY MONONUCLEAR NO ORGANISMS SEEN    Culture   Final    NO GROWTH 3 DAYS Performed at [facility], [place] [address], [place], [place] [address]    Report Status 03/08/2019 FINAL  Final  SARS CORONAVIRUS 2 (TAT 6-24 HRS) Nasopharyngeal Nasopharyngeal Swab     Status: None   Collection Time: 03/09/19 12:00 PM   Specimen: Nasopharyngeal Swab  Result Value Ref Range Status   SARS Coronavirus 2 NEGATIVE NEGATIVE Final    Comment: (NOTE) SARS-CoV-2 target nucleic acids are NOT DETECTED. The SARS-CoV-2 RNA is generally detectable in upper and lower respiratory specimens during the acute phase of infection. Negative results do not preclude SARS-CoV-2 infection, do not rule out co-infections with other pathogens, and should not be used as the sole basis for treatment or other patient management decisions. Negative results must be combined with clinical observations, patient history, and epidemiological information. The expected result is Negative. Fact Sheet for Patients: SugarRoll.be Fact Sheet for Healthcare Providers: https://www.woods-mathews.com/ This test is not yet approved or cleared by the [place] FDA and  has been authorized for detection and/or diagnosis of SARS-CoV-2 by FDA under an Emergency Use  Authorization (EUA). This EUA will remain  in effect (meaning this test can be used) for the duration of the COVID-19 declaration under Section 56 4(b)(1) of the Act, 21 U.S.C. section 360bbb-3(b)(1), unless the authorization is terminated or revoked sooner. Performed at [facility], [place] [address], [place], [place] [address]       Management plans discussed with the patient, family and they are in agreement.  CODE STATUS:     Code Status Orders  (From admission, onward)         Start     Ordered   03/04/19 1544  Do not attempt resuscitation (DNR)  Continuous    Question Answer Comment  In the event of cardiac or respiratory ARREST Do not call a "code blue"   In the event of cardiac or respiratory ARREST Do not perform Intubation, CPR, defibrillation or ACLS   In the event of cardiac or respiratory ARREST Use medication by any route, position, wound care, and other measures to relive pain and suffering. May use oxygen, suction and manual treatment of airway obstruction as needed for comfort.      03/04/19 1543        Code Status History    Date Active Date Inactive Code Status Order ID Comments User Context   02/18/2019 1832 02/20/2019 2120 DNR [id number]  [name], MD Inpatient   12/24/2018 1730 12/24/2018 2132 Full Code [id number]  [name], MD Inpatient   10/27/2018 2342 11/05/2018 1711 DNR [id number]  Christel Mormon, MD ED   10/27/2018 2342 10/27/2018 2342 Full Code [id number]  Mansy, Arvella Merles, MD ED   08/07/2018 2329 08/09/2018 1634 DNR [id number]  Lance Coon, MD Inpatient   07/25/2018 0955 07/26/2018 1908 DNR [id number]  Bettey Costa, MD Inpatient  07/25/2018 0138 07/25/2018 0954 DNR [id number]  Lance Coon, MD Inpatient   07/06/2018 2054 07/10/2018 1959 DNR [id number]  Henreitta Leber, MD Inpatient   06/27/2018 1150 07/05/2018 1757 DNR [id number]  Lang Snow, NP Inpatient   06/09/2018 1350 06/14/2018 1730 DNR [id number]  Lang Snow, NP ED   05/23/2018  0919 05/28/2018 2024 DNR [id number]  Demetrios Loll, MD Inpatient   05/23/2018 0104 05/23/2018 0918 Full Code [id number]  Mayer Camel, NP Inpatient   Advance Care Planning Activity    Advance Directive Documentation     Most Recent Value  Type of Advance Directive  Out of facility DNR (pink MOST or yellow form)  Pre-existing out of facility DNR order (yellow form or pink MOST form)  --  "MOST" Form in Place?  --      TOTAL TIME TAKING CARE OF THIS PATIENT: 32 minutes.    Loletha Grayer M.D on 03/10/2019 at 10:06 AM  Between 7am to 6pm - Pager - [phone]  After 6pm go to [email or web address] - password [email or web address] [place]  [facility]  CC: Primary care physician; Baxter Hire, MD

## 2019-03-10 NOTE — TOC Progression Note (Addendum)
Transition of Care [facility]) - Progression Note    Patient Details  Name: Arthur [name] MRN: [id number] Date of Birth: 1933/02/07  [facility] CM/SW [place], LCSW Phone Number: 03/10/2019, 8:25 AM  Clinical Narrative:   8:20- CSW sent message to [place] at [facility] to follow-up on obtaining insurance authorization for patient. Also informed patient's RN that patient will need another rapid COVID swab today.   9:30- Was informed by Magda Paganini that insurance Josem Kaufmann was received.   10:50- COVID results were negative. Spoke with Magda Paganini who reported patient can come back any time today. Magda Paganini reported she has a copy of the Discharge Summary already. Informed MD and RN. EMS paperwork completed.    Expected Discharge Plan: [place] Barriers to Discharge: Continued Medical Work up  Expected Discharge Plan and Services Expected Discharge Plan: [place] arrangements for the past 2 months: Skilled Nursing Facility(was at [facility]) Expected Discharge Date: 03/07/19                                     Social Determinants of Health (SDOH) Interventions    Readmission Risk Interventions Readmission Risk Prevention Plan 03/05/2019 11/05/2018 10/29/2018  Transportation Screening Complete Complete Complete  PCP or Specialist Appt within 5-7 Days - - -  PCP or Specialist Appt within 3-5 Days - - -  Home Care Screening - - -  Medication Review (RN CM) - - -  [place] or [place] Work Consult for [place] - - -  Medication Review Press photographer) Complete Complete Complete  PCP or Specialist appointment within 3-5 days of discharge Complete (No Data) -  [place] or Home Care Consult Complete - -  SW Recovery Care/Counseling Consult - - -  Palliative Care Screening - Not Applicable -  [place] - Complete -  Some  recent data might be hidden

## 2019-03-10 NOTE — Care Management Important Message (Signed)
Important Message  Patient Details  Name: Arthur [name] MRN: [id number] Date of Birth: 14-Jul-1933   Medicare Important Message Given:  Yes     Dannette Barbara 03/10/2019, 11:26 AM

## 2019-03-11 ENCOUNTER — Telehealth: Payer: Self-pay

## 2019-03-11 DIAGNOSIS — D631 Anemia in chronic kidney disease: Secondary | ICD-10-CM | POA: Diagnosis not present

## 2019-03-11 DIAGNOSIS — J431 Panlobular emphysema: Secondary | ICD-10-CM | POA: Diagnosis not present

## 2019-03-11 DIAGNOSIS — E1121 Type 2 diabetes mellitus with diabetic nephropathy: Secondary | ICD-10-CM | POA: Diagnosis not present

## 2019-03-11 DIAGNOSIS — Z794 Long term (current) use of insulin: Secondary | ICD-10-CM | POA: Diagnosis not present

## 2019-03-11 DIAGNOSIS — N189 Chronic kidney disease, unspecified: Secondary | ICD-10-CM | POA: Diagnosis not present

## 2019-03-11 DIAGNOSIS — J9611 Chronic respiratory failure with hypoxia: Secondary | ICD-10-CM | POA: Diagnosis not present

## 2019-03-11 NOTE — Telephone Encounter (Signed)
I spoke to pt daughter and facility they was fine with appts. But I can move them all to 3/17.. But MD schedule will be overbooked in the am

## 2019-03-11 NOTE — Telephone Encounter (Signed)
Please move to 3/17 so it can all be a one trip visit, since he is in the facility.

## 2019-03-11 NOTE — Telephone Encounter (Signed)
Pt is already sched to see MD on 03/18/19 as a 67min Hosp f/u  however Infusion has no space for   poss blood until 3/17. So, I'll have to move his MD appt from 3/16 to 3/17 or keep it as sched and have him return on 3/17 for +/- blood.

## 2019-03-11 NOTE — Telephone Encounter (Signed)
Please schedule patient for lab/MD/ retacrit or poss blood towards the end of this week or early next week. I have notified patient's daughter , [name], but patient is at [employer]. Please call his nurse at [facility] to relay appt details. Thank you.   Frederic main number: [phone]

## 2019-03-14 LAB — COMP PANEL: LEUKEMIA/LYMPHOMA

## 2019-03-18 ENCOUNTER — Inpatient Hospital Stay: Payer: Medicare HMO

## 2019-03-18 ENCOUNTER — Inpatient Hospital Stay: Payer: Medicare HMO | Attending: Oncology | Admitting: Oncology

## 2019-03-18 ENCOUNTER — Other Ambulatory Visit: Payer: Self-pay

## 2019-03-18 ENCOUNTER — Encounter: Payer: Self-pay | Admitting: Oncology

## 2019-03-18 VITALS — BP 190/97 | HR 97 | Temp 97.8°F | Resp 16 | Wt 204.0 lb

## 2019-03-18 DIAGNOSIS — D631 Anemia in chronic kidney disease: Secondary | ICD-10-CM

## 2019-03-18 DIAGNOSIS — R112 Nausea with vomiting, unspecified: Secondary | ICD-10-CM | POA: Diagnosis not present

## 2019-03-18 DIAGNOSIS — E785 Hyperlipidemia, unspecified: Secondary | ICD-10-CM | POA: Insufficient documentation

## 2019-03-18 DIAGNOSIS — Z794 Long term (current) use of insulin: Secondary | ICD-10-CM | POA: Diagnosis not present

## 2019-03-18 DIAGNOSIS — G8929 Other chronic pain: Secondary | ICD-10-CM | POA: Diagnosis not present

## 2019-03-18 DIAGNOSIS — E119 Type 2 diabetes mellitus without complications: Secondary | ICD-10-CM | POA: Insufficient documentation

## 2019-03-18 DIAGNOSIS — R109 Unspecified abdominal pain: Secondary | ICD-10-CM | POA: Insufficient documentation

## 2019-03-18 DIAGNOSIS — I509 Heart failure, unspecified: Secondary | ICD-10-CM | POA: Diagnosis not present

## 2019-03-18 DIAGNOSIS — Z87891 Personal history of nicotine dependence: Secondary | ICD-10-CM | POA: Diagnosis not present

## 2019-03-18 DIAGNOSIS — M545 Low back pain: Secondary | ICD-10-CM | POA: Diagnosis not present

## 2019-03-18 DIAGNOSIS — Z79899 Other long term (current) drug therapy: Secondary | ICD-10-CM | POA: Diagnosis not present

## 2019-03-18 DIAGNOSIS — J439 Emphysema, unspecified: Secondary | ICD-10-CM | POA: Diagnosis not present

## 2019-03-18 DIAGNOSIS — I10 Essential (primary) hypertension: Secondary | ICD-10-CM | POA: Diagnosis not present

## 2019-03-18 DIAGNOSIS — I130 Hypertensive heart and chronic kidney disease with heart failure and stage 1 through stage 4 chronic kidney disease, or unspecified chronic kidney disease: Secondary | ICD-10-CM | POA: Insufficient documentation

## 2019-03-18 DIAGNOSIS — J90 Pleural effusion, not elsewhere classified: Secondary | ICD-10-CM | POA: Insufficient documentation

## 2019-03-18 DIAGNOSIS — N184 Chronic kidney disease, stage 4 (severe): Secondary | ICD-10-CM | POA: Diagnosis not present

## 2019-03-18 DIAGNOSIS — N400 Enlarged prostate without lower urinary tract symptoms: Secondary | ICD-10-CM | POA: Insufficient documentation

## 2019-03-18 LAB — CBC WITH DIFFERENTIAL/PLATELET
Abs Immature Granulocytes: 0.02 10*3/uL (ref 0.00–0.07)
Basophils Absolute: 0.1 10*3/uL (ref 0.0–0.1)
Basophils Relative: 1 %
Eosinophils Absolute: 0.1 10*3/uL (ref 0.0–0.5)
Eosinophils Relative: 2 %
HCT: 29.2 % — ABNORMAL LOW (ref 39.0–52.0)
Hemoglobin: 9.6 g/dL — ABNORMAL LOW (ref 13.0–17.0)
Immature Granulocytes: 0 %
Lymphocytes Relative: 26 %
Lymphs Abs: 1.8 10*3/uL (ref 0.7–4.0)
MCH: 29.6 pg (ref 26.0–34.0)
MCHC: 32.9 g/dL (ref 30.0–36.0)
MCV: 90.1 fL (ref 80.0–100.0)
Monocytes Absolute: 0.7 10*3/uL (ref 0.1–1.0)
Monocytes Relative: 11 %
Neutro Abs: 4 10*3/uL (ref 1.7–7.7)
Neutrophils Relative %: 60 %
Platelets: 322 10*3/uL (ref 150–400)
RBC: 3.24 MIL/uL — ABNORMAL LOW (ref 4.22–5.81)
RDW: 15.2 % (ref 11.5–15.5)
WBC: 6.7 10*3/uL (ref 4.0–10.5)
nRBC: 0 % (ref 0.0–0.2)

## 2019-03-18 LAB — FERRITIN: Ferritin: 759 ng/mL — ABNORMAL HIGH (ref 24–336)

## 2019-03-18 LAB — RETIC PANEL
Immature Retic Fract: 8.4 % (ref 2.3–15.9)
RBC.: 3.29 MIL/uL — ABNORMAL LOW (ref 4.22–5.81)
Retic Count, Absolute: 32.2 10*3/uL (ref 19.0–186.0)
Retic Ct Pct: 1 % (ref 0.4–3.1)
Reticulocyte Hemoglobin: 27.9 pg — ABNORMAL LOW (ref >27.9)

## 2019-03-18 LAB — IRON AND TIBC
Iron: 29 ug/dL — ABNORMAL LOW (ref 45–182)
Saturation Ratios: 18 % (ref 17.9–39.5)
TIBC: 158 ug/dL — ABNORMAL LOW (ref 250–450)
UIBC: 129 ug/dL

## 2019-03-18 LAB — SAMPLE TO BLOOD BANK

## 2019-03-18 NOTE — Progress Notes (Signed)
Hematology/Oncology  [facility] Telephone:([phone] [phone]   Patient Care Team: Baxter Hire, MD as PCP - General (Internal Medicine) Earlie Server, MD as Consulting Physician (Oncology)  REFERRING PROVIDER: Baxter Hire, MD  CHIEF COMPLAINTS/REASON FOR VISIT:  Follow-up for anemia secondary to chronic kidney disease.  HISTORY OF PRESENTING ILLNESS:   Arthur [name] is a  86 y.o.  male with PMH listed below was seen in consultation at the request of  Baxter Hire, MD  for evaluation of anemia Patient has chronic comorbidities including diabetes, COPD/emphysema, chronic respiratory failure on home oxygen, CKD stage III, CHF, hypertension and hyperlipidemia. Reviewed patient's previous lab records. Extensive medical records review was performed by me. Anemia has been chronic, dated back to at least 2016.  At that time his hemoglobin was around 11-12. Hemoglobin started to trended down below 10 starting February 2020. 02/11/2018 hemoglobin 9, hematocrit 28.6, 06/17/2018 hemoglobin 7.3 hematocrit of 24, 06/27/2018 hemoglobin 5.8 07/15/2018 hemoglobin 7.5 07/29/2018 hemoglobin 8.2, 08/12/2018, hemoglobin 9.2   INTERVAL HISTORY Arthur [name] is a 86 y.o. male who has above history reviewed by me today presents for follow up visit for management of anemia Problems and complaints are listed below: Patient was here by himself. He currently resides in [facility].  He reports having back pain, which is a chronic issue for him. Hydrocodone is on his med list and was last administrated on 03/16/2019 1700. His BP is high in the clinic, probabaly due to his pain. He denies headache, focal weakness.  He is a poor historian. No other complaints.  Shortness of breath at baseline, on oxygen chronically.   Review of Systems  Constitutional: Positive for fatigue. Negative for appetite change, chills, fever and unexpected weight change.  HENT:   Negative  for hearing loss and voice change.   Eyes: Negative for eye problems and icterus.  Respiratory: Negative for chest tightness, cough and shortness of breath.   Cardiovascular: Positive for leg swelling. Negative for chest pain.  Gastrointestinal: Negative for abdominal distention and abdominal pain.  Endocrine: Negative for hot flashes.  Genitourinary: Negative for difficulty urinating, dysuria and frequency.   Musculoskeletal: Positive for back pain. Negative for arthralgias.       Chronic back pain  Skin: Negative for itching and rash.  Neurological: Negative for light-headedness and numbness.  Hematological: Negative for adenopathy. Does not bruise/bleed easily.  Psychiatric/Behavioral: Negative for confusion.    MEDICAL HISTORY:  Past Medical History:  Diagnosis Date  . Anemia of chronic kidney failure 08/21/2018  . Arthritis    lower back  . CHF (congestive heart failure) ([place])   . CKD (chronic kidney disease), stage III   . COPD (chronic obstructive pulmonary disease) ([place])   . Diabetes mellitus without complication ([place])    type 2  . Dyspnea    when active -wears oxygen 2L via [place]  . History of blood transfusion   . Hyperlipidemia   . Hypertension   . Requires continuous at home supplemental oxygen    2L via [place]    SURGICAL HISTORY: Past Surgical History:  Procedure Laterality Date  . COLONOSCOPY WITH PROPOFOL N/A 06/11/2018   Procedure: COLONOSCOPY WITH PROPOFOL;  Surgeon: Jonathon Bellows, MD;  Location: [facility] ENDOSCOPY;  Service: Gastroenterology;  Laterality: N/A;  . COLONOSCOPY WITH PROPOFOL N/A 06/13/2018   Procedure: COLONOSCOPY WITH PROPOFOL;  Surgeon: Jonathon Bellows, MD;  Location: [facility] ENDOSCOPY;  Service: Gastroenterology;  Laterality: N/A;  . ESOPHAGOGASTRODUODENOSCOPY Left 06/10/2018  Procedure: ESOPHAGOGASTRODUODENOSCOPY (EGD);  Surgeon: Jonathon Bellows, MD;  Location: [facility] ENDOSCOPY;  Service: Gastroenterology;  Laterality: Left;  . EYE SURGERY     removed cataracts  .  GIVENS CAPSULE STUDY N/A 06/28/2018   Procedure: GIVENS CAPSULE STUDY;  Surgeon: Lucilla Lame, MD;  Location: [facility] ENDOSCOPY;  Service: Endoscopy;  Laterality: N/A;  . GIVENS CAPSULE STUDY N/A 06/30/2018   Procedure: GIVENS CAPSULE STUDY;  Surgeon: Jonathon Bellows, MD;  Location: [facility] ENDOSCOPY;  Service: Gastroenterology;  Laterality: N/A;  . KYPHOPLASTY N/A 10/31/2018   Procedure: KYPHOPLASTY L2;  Surgeon: [name], MD;  Location: [facility] ORS;  Service: Orthopedics;  Laterality: N/A;  . KYPHOPLASTY N/A 12/24/2018   Procedure: KYPHOPLASTY L3;  Surgeon: [name], MD;  Location: [facility] ORS;  Service: Orthopedics;  Laterality: N/A;  . KYPHOPLASTY N/A 02/18/2019   Procedure: L2 KYPHOPLASTY;  Surgeon: [name], MD;  Location: [facility] ORS;  Service: Orthopedics;  Laterality: N/A;  . RADIOLOGY WITH ANESTHESIA N/A 01/28/2019   Procedure: MRI LUMBER SPINE WITHOUT CONTRAST;  Surgeon: Radiologist, Medication, MD;  Location: [facility];  Service: Radiology;  Laterality: N/A;    SOCIAL HISTORY: Social History   Socioeconomic History  . Marital status: Widowed    Spouse name: Not on file  . Number of children: Not on file  . Years of education: Not on file  . Highest education level: Not on file  Occupational History  . Not on file  Tobacco Use  . Smoking status: Former Smoker    Packs/day: 2.00    Years: 35.00    Pack years: 70.00    Types: Cigarettes    Quit date: 11/04/1982    Years since quitting: 36.3  . Smokeless tobacco: Current User    Types: Chew  Substance and Sexual Activity  . Alcohol use: Not Currently  . Drug use: No  . Sexual activity: Not Currently  Other Topics Concern  . Not on file  Social History Narrative  . Not on file   Social Determinants of Health   Financial Resource Strain: Low Risk   . Difficulty of Paying Living Expenses: Not hard at all  Food Insecurity: No Food Insecurity  . Worried About Charity fundraiser in the Last Year: Never true  . Ran Out of Food  in the Last Year: Never true  Transportation Needs: No Transportation Needs  . Lack of Transportation (Medical): No  . Lack of Transportation (Non-Medical): No  Physical Activity: Unknown  . Days of Exercise per Week: 0 days  . Minutes of Exercise per Session: Not on file  Stress: No Stress Concern Present  . Feeling of Stress : Not at all  Social Connections:   . Frequency of Communication with Friends and Family:   . Frequency of Social Gatherings with Friends and Family:   . Attends Religious Services:   . Active Member of Clubs or Organizations:   . Attends Archivist Meetings:   Marland Kitchen Marital Status:   Intimate Partner Violence: Unknown  . Fear of Current or Ex-Partner: Patient refused  . Emotionally Abused: Patient refused  . Physically Abused: Patient refused  . Sexually Abused: Patient refused    FAMILY HISTORY: Family History  Problem Relation Age of Onset  . COPD Mother   . Cancer Father     ALLERGIES:  is allergic to codeine.  MEDICATIONS:  Current Outpatient Medications  Medication Sig Dispense Refill  . acetaminophen (TYLENOL) 325 MG tablet Take 2 tablets (650 mg total) by mouth every  6 (six) hours as needed for mild pain (or Fever >/= 101).    Marland Kitchen albuterol (VENTOLIN HFA) 108 (90 Base) MCG/ACT inhaler Inhale 2 puffs into the lungs every 6 (six) hours as needed for wheezing or shortness of breath. 1 Inhaler 2  . amLODipine (NORVASC) 2.5 MG tablet Take 2.5 mg by mouth daily.    Marland Kitchen ascorbic acid (VITAMIN C) 500 MG tablet Take 500 mg by mouth at bedtime.    . docusate sodium (COLACE) 100 MG capsule Take 1 capsule (100 mg total) by mouth 2 (two) times daily. 60 capsule 0  . HYDROcodone-acetaminophen (NORCO) 5-325 MG tablet Take 1 tablet by mouth every 6 (six) hours as needed for moderate pain or severe pain. 9 tablet 0  . insulin aspart (NOVOLOG) 100 UNIT/ML injection 4 units prior to meals if sugars above 200 10 mL 11  . LEVEMIR 100 UNIT/ML injection Inject 0.04  mLs (4 Units total) into the skin at bedtime. 10 mL 0  . metoCLOPramide (REGLAN) 5 MG tablet Take 5 mg by mouth 4 (four) times daily -  before meals and at bedtime.    . pantoprazole (PROTONIX) 40 MG tablet Take 1 tablet (40 mg total) by mouth daily.    . polyethylene glycol powder (GLYCOLAX/MIRALAX) 17 GM/SCOOP powder Take 17 g by mouth every other day.     . potassium chloride (KLOR-CON) 10 MEQ tablet Take 10 mEq by mouth every evening.    . pravastatin (PRAVACHOL) 10 MG tablet Take 10 mg by mouth at bedtime.    . promethazine (PHENERGAN) 25 MG tablet Take 25 mg by mouth every 4 (four) hours as needed for nausea or vomiting.    . tamsulosin (FLOMAX) 0.4 MG CAPS capsule Take 0.4 mg by mouth daily.    Marland Kitchen tiotropium (SPIRIVA) 18 MCG inhalation capsule Place 18 mcg into inhaler and inhale daily.    Marland Kitchen torsemide (DEMADEX) 20 MG tablet Take 20 mg by mouth every Monday, Wednesday, and Friday.     No current facility-administered medications for this visit.     PHYSICAL EXAMINATION: ECOG PERFORMANCE STATUS: 3 - Symptomatic, >50% confined to bed Vitals:   03/18/19 0936  BP: (!) 190/97  Pulse: 97  Resp: 16  Temp: 97.8 F (36.6 C)  SpO2: 97%   Filed Weights   03/18/19 0936  Weight: 204 lb (92.5 kg)    Physical Exam Constitutional:      General: He is not in acute distress.    Appearance: He is ill-appearing.     Comments: Sits in wheel chair  HENT:     Head: Normocephalic and atraumatic.  Eyes:     General: No scleral icterus.    Pupils: Pupils are equal, round, and reactive to light.  Cardiovascular:     Rate and Rhythm: Normal rate and regular rhythm.     Heart sounds: Normal heart sounds.  Pulmonary:     Effort: Pulmonary effort is normal. No respiratory distress.     Breath sounds: No wheezing.     Comments: On nasal cannula oxygen. Bibasilar crackles. Abdominal:     General: Bowel sounds are normal. There is no distension.     Palpations: Abdomen is soft. There is no  mass.     Tenderness: There is no abdominal tenderness.  Musculoskeletal:        General: No deformity. Normal range of motion.     Cervical back: Normal range of motion and neck supple.     Comments: Chronic bilateral  lower extremity edema.   Skin:    General: Skin is warm and dry.     Coloration: Skin is not pale.  Neurological:     Mental Status: He is alert and oriented to person, place, and time. Mental status is at baseline.     Cranial Nerves: No cranial nerve deficit.     Coordination: Coordination normal.  Psychiatric:        Mood and Affect: Mood normal.        Behavior: Behavior normal.        Thought Content: Thought content normal.     LABORATORY DATA:  I have reviewed the data as listed Lab Results  Component Value Date   WBC 6.7 03/18/2019   HGB 9.6 (L) 03/18/2019   HCT 29.2 (L) 03/18/2019   MCV 90.1 03/18/2019   PLT 322 03/18/2019   Recent Labs    01/23/19 0826 01/28/19 0828 02/21/19 1847 02/21/19 1847 03/04/19 1220 03/05/19 0820 03/07/19 0722 03/08/19 0959 03/10/19 0538  NA  --    < > 135   < > 133*   < > 132* 131* 130*  K  --    < > 4.2   < > 3.3*   < > 3.8 3.7 3.9  CL  --    < > 94*   < > 95*   < > 95* 91* 93*  CO2  --    < > 29   < > 28   < > 29 30 32  GLUCOSE  --    < > 120*   < > 161*   < > 151* 195* 116*  BUN  --    < > 30*   < > 18   < > 34* 36* 27*  CREATININE  --    < > 1.74*   < > 1.54*   < > 1.94* 1.73* 1.59*  CALCIUM  --    < > 8.6*   < > 8.3*   < > 8.1* 8.1* 8.3*  GFRNONAA  --    < > 35*   < > 41*   < > 31* 36* 40*  GFRAA  --    < > 41*   < > 48*   < > 36* 41* 46*  PROT 7.8  --  7.2  --  6.9  --   --   --   --   ALBUMIN 3.2*  --  3.2*  --  3.3*  --   --   --   --   AST 13*  --  11*  --  9*  --   --   --   --   ALT 12  --  6  --  8  --   --   --   --   ALKPHOS 77  --  67  --  73  --   --   --   --   BILITOT 0.6  --  0.6  --  0.8  --   --   --   --   BILIDIR <0.1  --   --   --   --   --   --   --   --   IBILI NOT CALCULATED  --    --   --   --   --   --   --   --    < > = values in this interval not displayed.   Iron/TIBC/Ferritin/ %  Sat    Component Value Date/Time   IRON 29 (L) 03/18/2019 0910   IRON 36 (L) 07/15/2018 1544   TIBC 158 (L) 03/18/2019 0910   TIBC 172 (L) 07/15/2018 1544   FERRITIN 759 (H) 03/18/2019 0910   FERRITIN 535 (H) 07/15/2018 1544   IRONPCTSAT 18 03/18/2019 0910   IRONPCTSAT 21 07/15/2018 1544      RADIOGRAPHIC STUDIES: I have personally reviewed the radiological images as listed and agreed with the findings in the report.  DG Chest 2 View  Result Date: 03/04/2019 CLINICAL DATA:  Pleural effusion EXAM: CHEST - 2 VIEW COMPARISON:  10/27/2018 FINDINGS: Moderate right pleural effusion with interval progression. Progressive right lower lobe infiltrate. Pulmonary hyperinflation with COPD. No infiltrate or mass on the left. Heart size normal. Negative for heart failure. Atherosclerotic aortic arch. IMPRESSION: COPD. Progression of right pleural effusion and right lower lobe airspace disease. Possible chronic pneumonia however underlying neoplasm possible. Correlate with prior thoracentesis resolved. Consider follow-up CT chest with contrast. Electronically Signed   By: Franchot Gallo M.D.   On: 03/04/2019 14:54   DG Lumbar Spine 2-3 Views  Result Date: 02/18/2019 CLINICAL DATA:  Kyphoplasty EXAM: LUMBAR SPINE - 2-3 VIEW; DG C-ARM 1-60 MIN COMPARISON:  12/24/2018, MRI 01/28/2019 FINDINGS: Two lateral and 2 AP spot fluoroscopic images of the lumbar spine demonstrate 2 level kyphoplasty changes. Total fluoroscopy time was 2 minutes. IMPRESSION: Intraoperative fluoroscopic images of the lumbar spine for kyphoplasty. Electronically Signed   By: Donavan Foil M.D.   On: 02/18/2019 16:20   CT Abdomen Pelvis W Contrast  Result Date: 03/04/2019 CLINICAL DATA:  Lower abdominal pain, nausea and vomiting for 1 week. EXAM: CT ABDOMEN AND PELVIS WITH CONTRAST TECHNIQUE: Multidetector CT imaging of the abdomen  and pelvis was performed using the standard protocol following bolus administration of intravenous contrast. CONTRAST:  35m OMNIPAQUE IOHEXOL 300 MG/ML  SOLN COMPARISON:  10/27/2018 FINDINGS: Lower chest: Persistent right pleural effusion with progressive right lower lobe atelectasis. Scattered calcifications are noted in the atelectatic lung. These appear new and could be due to chronic inflammation or aspiration. I do not see any obvious pleural lesions possible areas of vague pleural enhancement. Thoracentesis may be helpful for diagnostic purposes. The left lung base is grossly clear. Minimal dependent subpleural atelectasis. The heart is normal in size. No pericardial effusion. Stable coronary artery and aortic calcifications. Borderline enlarged subcarinal node measuring 9 mm. Hepatobiliary: No focal hepatic lesions or intrahepatic biliary dilatation. The gallbladder is mildly dilated and contains a 18 mm rim calcified calculus. No gallbladder wall thickening or pericholecystic inflammatory changes to suggest acute cholecystitis. No common bile duct dilatation. Pancreas: No mass, inflammation or ductal dilatation. Small duodenal diverticulum noted near the pancreatic head. Spleen: Normal size. No focal lesions. Adrenals/Urinary Tract: Stable low-attenuation nodule involving the medial limb of the left adrenal gland consistent with a benign adenoma. No worrisome renal lesions or hydronephrosis. No renal or obstructing ureteral calculi. The bladder is unremarkable. No bladder mass or asymmetric bladder wall thickening. Mild prostate gland enlargement with median lobe hypertrophy impressing on the base of the bladder. Stomach/Bowel: The stomach, duodenum, small bowel and colon are grossly normal without oral contrast. No acute inflammatory changes, mass lesions or obstructive findings. The terminal ileum and appendix are normal. Vascular/Lymphatic: Advanced atherosclerotic calcifications involving the aorta,  iliac arteries and branch vessels. No focal aneurysm or dissection. The major venous structures are patent. No mesenteric or retroperitoneal mass or adenopathy. Reproductive: Mild prostate gland enlargement with  median lobe hypertrophy impressing on the base of the bladder. The seminal vesicles appear normal. Other: Small periumbilical abdominal wall hernia containing fat is stable. No pelvic mass or pelvic adenopathy. No free pelvic fluid collections. Musculoskeletal: Vertebral augmentation changes are noted at L2 and L3, new since prior study. No new spine fractures are identified. The hips are normally located. No worrisome bone lesions. IMPRESSION: 1. No acute abdominal/pelvic findings, mass lesions or lymphadenopathy. 2. Persistent right pleural effusion with progressive right lower lobe atelectasis. There are also calcifications in the atelectatic lung which could be due to chronic inflammation or aspiration. Thoracentesis may be helpful for diagnostic purposes. Vague areas of pleural enhancement are suspected. 3. Cholelithiasis.  No CT findings for acute cholecystitis. 4. Advanced atherosclerotic calcifications involving the aorta and branch vessels. 5. Stable left adrenal gland adenoma. 6. Vertebral augmentation changes at L2 and L3. Aortic Atherosclerosis (ICD10-I70.0). Electronically Signed   By: Marijo Sanes M.D.   On: 03/04/2019 13:46   DG Chest Port 1 View  Result Date: 03/05/2019 CLINICAL DATA:  Post right-sided thoracentesis EXAM: PORTABLE CHEST 1 VIEW COMPARISON:  Chest radiograph-03/04/2019; chest CT-10/07/2018; CT abdomen pelvis-03/04/2019 FINDINGS: Grossly unchanged cardiac silhouette and mediastinal contours with partial obscuration of the right heart border secondary to heterogeneous/consolidative opacities, as demonstrated on preceding abdominal CT. Atherosclerotic plaque within thoracic aorta. Interval reduction/resolution of right-sided pleural effusion post thoracentesis with development  of a loculated right basilar ex vacuo hydropneumothorax. There is no definitive apical component of this ex vacuo pneumothorax. No mediastinal shift. The left hemithorax remains well aerated. No left-sided pleural effusion. No evidence of edema. No acute osseous abnormalities. IMPRESSION: 1. Interval reduction/resolution of right-sided pleural effusion post thoracentesis with development of a loculated right basilar ex vacuo hydropneumothorax. 2. Partial obscuration of right heart border secondary to right perihilar heterogeneous/consolidative opacities as demonstrated on preceding abdominal CT. Electronically Signed   By: Sandi Mariscal M.D.   On: 03/05/2019 10:23   DG C-Arm 1-60 Min  Result Date: 02/18/2019 CLINICAL DATA:  Kyphoplasty EXAM: LUMBAR SPINE - 2-3 VIEW; DG C-ARM 1-60 MIN COMPARISON:  12/24/2018, MRI 01/28/2019 FINDINGS: Two lateral and 2 AP spot fluoroscopic images of the lumbar spine demonstrate 2 level kyphoplasty changes. Total fluoroscopy time was 2 minutes. IMPRESSION: Intraoperative fluoroscopic images of the lumbar spine for kyphoplasty. Electronically Signed   By: Donavan Foil M.D.   On: 02/18/2019 16:20   US THORACENTESIS ASP PLEURAL SPACE W/IMG GUIDE  Result Date: 03/05/2019 INDICATION: Symptomatic right-sided pleural effusion. Please perform ultrasound-guided thoracentesis for diagnostic and therapeutic purposes. EXAM: US THORACENTESIS ASP PLEURAL SPACE W/IMG GUIDE COMPARISON:  Chest radiograph-earlier same day; CT abdomen pelvis-03/04/2019 Ultrasound-guided right-sided thoracentesis-08/08/2018; 07/10/2018 MEDICATIONS: None. COMPLICATIONS: SIR Level A - No therapy, no consequence. Procedure complicated by development of an asymptomatic right basilar ex vacuo hydropneumothorax. TECHNIQUE: Informed written consent was obtained from the patient after a discussion of the risks, benefits and alternatives to treatment. A timeout was performed prior to the initiation of the procedure. Initial  ultrasound scanning demonstrates a moderate sized anechoic right-sided pleural effusion. The lower chest was prepped and draped in the usual sterile fashion. 1% lidocaine was used for local anesthesia. An ultrasound image was saved for documentation purposes. An 8 Fr Safe-T-Centesis catheter was introduced. The thoracentesis was performed. The catheter was removed and a dressing was applied. The patient tolerated the procedure well without immediate post procedural complication. The patient was escorted to have an upright chest radiograph. FINDINGS: A total of approximately 700 cc of  serous, slightly blood tinged fluid was removed. Requested samples were sent to the laboratory. IMPRESSION: Successful ultrasound-guided right sided thoracentesis yielding 700 cc of pleural fluid. Electronically Signed   By: Sandi Mariscal M.D.   On: 03/05/2019 11:45      ASSESSMENT & PLAN:  1. Anemia of chronic renal failure, stage 4 (severe) ([place])   2. CKD (chronic kidney disease) stage 4, GFR 15-29 ml/min (HCC)   3. Uncontrolled hypertension   4. Chronic low back pain, unspecified back pain laterality, unspecified whether sciatica present    #Anemia of chronic kidney disease, underlying MDS cannot be ruled out.  Patient declines bone marrow biopsy due to multiple other medical problems. Labs are reviewed and discussed with patient. He received Aranesp 173mg during his recent admission.  His hemoglobin has improved at 9.6. No need for transfusion. Uncontrolled hypertension, BP is elevated at 190/97, likely due to his back pain. He refuses to go to  ER. He has no new focal weakness or headache.  Discussed with him that due to the blood pressure is high, I will hold Retacrit at this point.  Repeat blood work in 2 weeks for evaluation prior to retacrit.  I called daughter LLattie Hawand update her.  # chronic back pain, continue pain regimen.  # CKD, avoid nephrotoxins. All questions were answered. The patient knows to call  the clinic with any problems questions or concerns.   ZEarlie Server MD, PhD Hematology Oncology CPioneer Memorial Hospital And Health Servicesat AAirport Endoscopy CenterPager- [phone]/16/2021

## 2019-03-18 NOTE — Progress Notes (Signed)
Called [facility] to confirm pain medication (they are going to fax a complete med list).  He does have Hydrocodone on his med list and was last administered on 03/16/19 @ 1700.

## 2019-03-18 NOTE — Progress Notes (Signed)
Patient mentions back pain

## 2019-03-19 ENCOUNTER — Inpatient Hospital Stay: Payer: Medicare HMO

## 2019-03-21 DIAGNOSIS — D649 Anemia, unspecified: Secondary | ICD-10-CM | POA: Diagnosis not present

## 2019-03-28 DIAGNOSIS — J441 Chronic obstructive pulmonary disease with (acute) exacerbation: Secondary | ICD-10-CM | POA: Diagnosis not present

## 2019-04-02 ENCOUNTER — Inpatient Hospital Stay (HOSPITAL_BASED_OUTPATIENT_CLINIC_OR_DEPARTMENT_OTHER): Payer: Medicare HMO | Admitting: Oncology

## 2019-04-02 ENCOUNTER — Inpatient Hospital Stay: Payer: Medicare HMO

## 2019-04-02 ENCOUNTER — Encounter: Payer: Self-pay | Admitting: Oncology

## 2019-04-02 ENCOUNTER — Other Ambulatory Visit: Payer: Self-pay

## 2019-04-02 VITALS — BP 151/75 | HR 68 | Temp 97.1°F | Resp 18

## 2019-04-02 DIAGNOSIS — N184 Chronic kidney disease, stage 4 (severe): Secondary | ICD-10-CM

## 2019-04-02 DIAGNOSIS — J439 Emphysema, unspecified: Secondary | ICD-10-CM | POA: Diagnosis not present

## 2019-04-02 DIAGNOSIS — I130 Hypertensive heart and chronic kidney disease with heart failure and stage 1 through stage 4 chronic kidney disease, or unspecified chronic kidney disease: Secondary | ICD-10-CM | POA: Diagnosis not present

## 2019-04-02 DIAGNOSIS — D631 Anemia in chronic kidney disease: Secondary | ICD-10-CM

## 2019-04-02 DIAGNOSIS — J90 Pleural effusion, not elsewhere classified: Secondary | ICD-10-CM | POA: Diagnosis not present

## 2019-04-02 DIAGNOSIS — N189 Chronic kidney disease, unspecified: Secondary | ICD-10-CM

## 2019-04-02 DIAGNOSIS — E119 Type 2 diabetes mellitus without complications: Secondary | ICD-10-CM | POA: Diagnosis not present

## 2019-04-02 DIAGNOSIS — Z79899 Other long term (current) drug therapy: Secondary | ICD-10-CM | POA: Diagnosis not present

## 2019-04-02 DIAGNOSIS — E785 Hyperlipidemia, unspecified: Secondary | ICD-10-CM | POA: Diagnosis not present

## 2019-04-02 DIAGNOSIS — I509 Heart failure, unspecified: Secondary | ICD-10-CM | POA: Diagnosis not present

## 2019-04-02 LAB — CBC WITH DIFFERENTIAL/PLATELET
Abs Immature Granulocytes: 0.03 10*3/uL (ref 0.00–0.07)
Basophils Absolute: 0 10*3/uL (ref 0.0–0.1)
Basophils Relative: 1 %
Eosinophils Absolute: 0 10*3/uL (ref 0.0–0.5)
Eosinophils Relative: 1 %
HCT: 29.1 % — ABNORMAL LOW (ref 39.0–52.0)
Hemoglobin: 9.5 g/dL — ABNORMAL LOW (ref 13.0–17.0)
Immature Granulocytes: 1 %
Lymphocytes Relative: 20 %
Lymphs Abs: 1.3 10*3/uL (ref 0.7–4.0)
MCH: 29.1 pg (ref 26.0–34.0)
MCHC: 32.6 g/dL (ref 30.0–36.0)
MCV: 89.3 fL (ref 80.0–100.0)
Monocytes Absolute: 0.7 10*3/uL (ref 0.1–1.0)
Monocytes Relative: 11 %
Neutro Abs: 4.5 10*3/uL (ref 1.7–7.7)
Neutrophils Relative %: 66 %
Platelets: 338 10*3/uL (ref 150–400)
RBC: 3.26 MIL/uL — ABNORMAL LOW (ref 4.22–5.81)
RDW: 15.3 % (ref 11.5–15.5)
WBC: 6.7 10*3/uL (ref 4.0–10.5)
nRBC: 0 % (ref 0.0–0.2)

## 2019-04-02 LAB — SAMPLE TO BLOOD BANK

## 2019-04-02 MED ORDER — EPOETIN ALFA-EPBX 10000 UNIT/ML IJ SOLN
20000.0000 [IU] | Freq: Once | INTRAMUSCULAR | Status: AC
  Administered 2019-04-02: 20000 [IU] via SUBCUTANEOUS
  Filled 2019-04-02: qty 2

## 2019-04-02 MED ORDER — EPOETIN ALFA-EPBX 40000 UNIT/ML IJ SOLN
40000.0000 [IU] | Freq: Once | INTRAMUSCULAR | Status: AC
  Administered 2019-04-02: 40000 [IU] via SUBCUTANEOUS
  Filled 2019-04-02: qty 1

## 2019-04-02 MED ORDER — EPOETIN ALFA-EPBX 40000 UNIT/ML IJ SOLN: 60000.0000 [IU] | Freq: Once | INTRAMUSCULAR | Status: DC

## 2019-04-02 NOTE — Progress Notes (Signed)
Patient is a resident at [facility].  He reports feeling fatigued and sleepy.

## 2019-04-02 NOTE — Progress Notes (Signed)
Hematology/Oncology  [facility] Telephone:([phone] [phone]   Patient Care Team: Baxter Hire, MD as PCP - General (Internal Medicine) Earlie Server, MD as Consulting Physician (Oncology)  REFERRING PROVIDER: Baxter Hire, MD  CHIEF COMPLAINTS/REASON FOR VISIT:  Follow-up for anemia secondary to chronic kidney disease.  HISTORY OF PRESENTING ILLNESS:   Arthur [name] is a  86 y.o.  male with PMH listed below was seen in consultation at the request of  Baxter Hire, MD  for evaluation of anemia Patient has chronic comorbidities including diabetes, COPD/emphysema, chronic respiratory failure on home oxygen, CKD stage III, CHF, hypertension and hyperlipidemia. Reviewed patient's previous lab records. Extensive medical records review was performed by me. Anemia has been chronic, dated back to at least 2016.  At that time his hemoglobin was around 11-12. Hemoglobin started to trended down below 10 starting February 2020. 02/11/2018 hemoglobin 9, hematocrit 28.6, 06/17/2018 hemoglobin 7.3 hematocrit of 24, 06/27/2018 hemoglobin 5.8 07/15/2018 hemoglobin 7.5 07/29/2018 hemoglobin 8.2, 08/12/2018, hemoglobin 9.2   INTERVAL HISTORY Arthur [name] is a 86 y.o. male who has above history reviewed by me today presents for follow up visit for management of anemia Problems and complaints are listed below: Patient was here by himself. He currently resides in [place].  Patient has no new complaints today.  Denies any pain today. Blood pressure has improved.  Chronic shortness of breath on baseline.  On oxygen.  Review of Systems  Constitutional: Positive for fatigue. Negative for appetite change, chills, fever and unexpected weight change.  HENT:   Negative for hearing loss and voice change.   Eyes: Negative for eye problems and icterus.  Respiratory: Negative for chest tightness, cough and shortness of breath.   Cardiovascular: Negative for  chest pain and leg swelling.  Gastrointestinal: Negative for abdominal distention and abdominal pain.  Endocrine: Negative for hot flashes.  Genitourinary: Negative for difficulty urinating, dysuria and frequency.   Musculoskeletal: Negative for arthralgias.       Chronic back pain  Skin: Negative for itching and rash.  Neurological: Negative for light-headedness and numbness.  Hematological: Negative for adenopathy. Does not bruise/bleed easily.  Psychiatric/Behavioral: Negative for confusion.    MEDICAL HISTORY:  Past Medical History:  Diagnosis Date  . Anemia of chronic kidney failure 08/21/2018  . Arthritis    lower back  . CHF (congestive heart failure) ([place])   . CKD (chronic kidney disease), stage III   . COPD (chronic obstructive pulmonary disease) ([place])   . Diabetes mellitus without complication ([place])    type 2  . Dyspnea    when active -wears oxygen 2L via [place]  . History of blood transfusion   . Hyperlipidemia   . Hypertension   . Requires continuous at home supplemental oxygen    2L via [place]    SURGICAL HISTORY: Past Surgical History:  Procedure Laterality Date  . COLONOSCOPY WITH PROPOFOL N/A 06/11/2018   Procedure: COLONOSCOPY WITH PROPOFOL;  Surgeon: Jonathon Bellows, MD;  Location: [facility] ENDOSCOPY;  Service: Gastroenterology;  Laterality: N/A;  . COLONOSCOPY WITH PROPOFOL N/A 06/13/2018   Procedure: COLONOSCOPY WITH PROPOFOL;  Surgeon: Jonathon Bellows, MD;  Location: [facility] ENDOSCOPY;  Service: Gastroenterology;  Laterality: N/A;  . ESOPHAGOGASTRODUODENOSCOPY Left 06/10/2018   Procedure: ESOPHAGOGASTRODUODENOSCOPY (EGD);  Surgeon: Jonathon Bellows, MD;  Location: [facility] ENDOSCOPY;  Service: Gastroenterology;  Laterality: Left;  . EYE SURGERY     removed cataracts  . GIVENS CAPSULE STUDY N/A 06/28/2018   Procedure: GIVENS CAPSULE  STUDY;  Surgeon: Lucilla Lame, MD;  Location: [facility] ENDOSCOPY;  Service: Endoscopy;  Laterality: N/A;  . GIVENS CAPSULE STUDY N/A 06/30/2018   Procedure: GIVENS  CAPSULE STUDY;  Surgeon: Jonathon Bellows, MD;  Location: [facility] ENDOSCOPY;  Service: Gastroenterology;  Laterality: N/A;  . KYPHOPLASTY N/A 10/31/2018   Procedure: KYPHOPLASTY L2;  Surgeon: [name], MD;  Location: [facility] ORS;  Service: Orthopedics;  Laterality: N/A;  . KYPHOPLASTY N/A 12/24/2018   Procedure: KYPHOPLASTY L3;  Surgeon: [name], MD;  Location: [facility] ORS;  Service: Orthopedics;  Laterality: N/A;  . KYPHOPLASTY N/A 02/18/2019   Procedure: L2 KYPHOPLASTY;  Surgeon: [name], MD;  Location: [facility] ORS;  Service: Orthopedics;  Laterality: N/A;  . RADIOLOGY WITH ANESTHESIA N/A 01/28/2019   Procedure: MRI LUMBER SPINE WITHOUT CONTRAST;  Surgeon: Radiologist, Medication, MD;  Location: [place];  Service: Radiology;  Laterality: N/A;    SOCIAL HISTORY: Social History   Socioeconomic History  . Marital status: Widowed    Spouse name: Not on file  . Number of children: Not on file  . Years of education: Not on file  . Highest education level: Not on file  Occupational History  . Not on file  Tobacco Use  . Smoking status: Former Smoker    Packs/day: 2.00    Years: 35.00    Pack years: 70.00    Types: Cigarettes    Quit date: 11/04/1982    Years since quitting: 36.4  . Smokeless tobacco: Current User    Types: Chew  Substance and Sexual Activity  . Alcohol use: Not Currently  . Drug use: No  . Sexual activity: Not Currently  Other Topics Concern  . Not on file  Social History Narrative  . Not on file   Social Determinants of Health   Financial Resource Strain: Low Risk   . Difficulty of Paying Living Expenses: Not hard at all  Food Insecurity: No Food Insecurity  . Worried About Charity fundraiser in the Last Year: Never true  . Ran Out of Food in the Last Year: Never true  Transportation Needs: No Transportation Needs  . Lack of Transportation (Medical): No  . Lack of Transportation (Non-Medical): No  Physical Activity: Unknown  . Days of Exercise per Week: 0  days  . Minutes of Exercise per Session: Not on file  Stress: No Stress Concern Present  . Feeling of Stress : Not at all  Social Connections:   . Frequency of Communication with Friends and Family:   . Frequency of Social Gatherings with Friends and Family:   . Attends Religious Services:   . Active Member of Clubs or Organizations:   . Attends Archivist Meetings:   Marland Kitchen Marital Status:   Intimate Partner Violence: Unknown  . Fear of Current or Ex-Partner: Patient refused  . Emotionally Abused: Patient refused  . Physically Abused: Patient refused  . Sexually Abused: Patient refused    FAMILY HISTORY: Family History  Problem Relation Age of Onset  . COPD Mother   . Cancer Father     ALLERGIES:  is allergic to codeine.  MEDICATIONS:  Current Outpatient Medications  Medication Sig Dispense Refill  . acetaminophen (TYLENOL) 325 MG tablet Take 2 tablets (650 mg total) by mouth every 6 (six) hours as needed for mild pain (or Fever >/= 101).    Marland Kitchen albuterol (VENTOLIN HFA) 108 (90 Base) MCG/ACT inhaler Inhale 2 puffs into the lungs every 6 (six) hours as needed for wheezing or shortness  of breath. 1 Inhaler 2  . amLODipine (NORVASC) 2.5 MG tablet Take 2.5 mg by mouth daily.    Marland Kitchen docusate sodium (COLACE) 100 MG capsule Take 1 capsule (100 mg total) by mouth 2 (two) times daily. 60 capsule 0  . HYDROcodone-acetaminophen (NORCO) 5-325 MG tablet Take 1 tablet by mouth every 6 (six) hours as needed for moderate pain or severe pain. 9 tablet 0  . insulin aspart (NOVOLOG) 100 UNIT/ML injection 4 units prior to meals if sugars above 200 10 mL 11  . LEVEMIR 100 UNIT/ML injection Inject 0.04 mLs (4 Units total) into the skin at bedtime. 10 mL 0  . metoCLOPramide (REGLAN) 5 MG tablet Take 5 mg by mouth 4 (four) times daily -  before meals and at bedtime.    . polyethylene glycol powder (GLYCOLAX/MIRALAX) 17 GM/SCOOP powder Take 17 g by mouth every other day.     . potassium chloride  (KLOR-CON) 10 MEQ tablet Take 10 mEq by mouth every evening.    . tiotropium (SPIRIVA) 18 MCG inhalation capsule Place 18 mcg into inhaler and inhale daily.    Marland Kitchen torsemide (DEMADEX) 20 MG tablet Take 20 mg by mouth every Monday, Wednesday, and Friday.    Marland Kitchen ascorbic acid (VITAMIN C) 500 MG tablet Take 500 mg by mouth at bedtime.    . pantoprazole (PROTONIX) 40 MG tablet Take 1 tablet (40 mg total) by mouth daily. (Patient not taking: Reported on 04/02/2019)    . pravastatin (PRAVACHOL) 10 MG tablet Take 10 mg by mouth at bedtime.    . promethazine (PHENERGAN) 25 MG tablet Take 25 mg by mouth every 4 (four) hours as needed for nausea or vomiting.    . tamsulosin (FLOMAX) 0.4 MG CAPS capsule Take 0.4 mg by mouth daily.     No current facility-administered medications for this visit.     PHYSICAL EXAMINATION: ECOG PERFORMANCE STATUS: 3 - Symptomatic, >50% confined to bed Vitals:   04/02/19 1014  BP: (!) 151/75  Pulse: 68  Resp: 18  Temp: (!) 97.1 F (36.2 C)   There were no vitals filed for this visit.  Physical Exam Constitutional:      General: He is not in acute distress.    Appearance: He is ill-appearing.     Comments: Sits in wheel chair  HENT:     Head: Normocephalic and atraumatic.  Eyes:     General: No scleral icterus.    Pupils: Pupils are equal, round, and reactive to light.  Cardiovascular:     Rate and Rhythm: Normal rate and regular rhythm.     Heart sounds: Normal heart sounds.  Pulmonary:     Effort: Pulmonary effort is normal. No respiratory distress.     Breath sounds: No wheezing.     Comments: On nasal cannula oxygen. Bibasilar crackles. Abdominal:     General: Bowel sounds are normal. There is no distension.     Palpations: Abdomen is soft. There is no mass.     Tenderness: There is no abdominal tenderness.  Musculoskeletal:        General: No deformity. Normal range of motion.     Cervical back: Normal range of motion and neck supple.     Comments:  Chronic bilateral lower extremity edema.   Skin:    General: Skin is warm and dry.     Coloration: Skin is not pale.  Neurological:     Mental Status: He is alert and oriented to person, place, and time.  Mental status is at baseline.     Cranial Nerves: No cranial nerve deficit.     Coordination: Coordination normal.  Psychiatric:        Mood and Affect: Mood normal.        Behavior: Behavior normal.        Thought Content: Thought content normal.     LABORATORY DATA:  I have reviewed the data as listed Lab Results  Component Value Date   WBC 6.7 04/02/2019   HGB 9.5 (L) 04/02/2019   HCT 29.1 (L) 04/02/2019   MCV 89.3 04/02/2019   PLT 338 04/02/2019   Recent Labs    01/23/19 0826 01/28/19 0828 02/21/19 1847 02/21/19 1847 03/04/19 1220 03/05/19 0820 03/07/19 0722 03/08/19 0959 03/10/19 0538  NA  --    < > 135   < > 133*   < > 132* 131* 130*  K  --    < > 4.2   < > 3.3*   < > 3.8 3.7 3.9  CL  --    < > 94*   < > 95*   < > 95* 91* 93*  CO2  --    < > 29   < > 28   < > 29 30 32  GLUCOSE  --    < > 120*   < > 161*   < > 151* 195* 116*  BUN  --    < > 30*   < > 18   < > 34* 36* 27*  CREATININE  --    < > 1.74*   < > 1.54*   < > 1.94* 1.73* 1.59*  CALCIUM  --    < > 8.6*   < > 8.3*   < > 8.1* 8.1* 8.3*  GFRNONAA  --    < > 35*   < > 41*   < > 31* 36* 40*  GFRAA  --    < > 41*   < > 48*   < > 36* 41* 46*  PROT 7.8  --  7.2  --  6.9  --   --   --   --   ALBUMIN 3.2*  --  3.2*  --  3.3*  --   --   --   --   AST 13*  --  11*  --  9*  --   --   --   --   ALT 12  --  6  --  8  --   --   --   --   ALKPHOS 77  --  67  --  73  --   --   --   --   BILITOT 0.6  --  0.6  --  0.8  --   --   --   --   BILIDIR <0.1  --   --   --   --   --   --   --   --   IBILI NOT CALCULATED  --   --   --   --   --   --   --   --    < > = values in this interval not displayed.   Iron/TIBC/Ferritin/ %Sat    Component Value Date/Time   IRON 29 (L) 03/18/2019 0910   IRON 36 (L) 07/15/2018 1544    TIBC 158 (L) 03/18/2019 0910   TIBC 172 (L) 07/15/2018 1544   FERRITIN 759 (H) 03/18/2019 4401  FERRITIN 535 (H) 07/15/2018 1544   IRONPCTSAT 18 03/18/2019 0910   IRONPCTSAT 21 07/15/2018 1544      RADIOGRAPHIC STUDIES: I have personally reviewed the radiological images as listed and agreed with the findings in the report.  DG Chest 2 View  Result Date: 03/04/2019 CLINICAL DATA:  Pleural effusion EXAM: CHEST - 2 VIEW COMPARISON:  10/27/2018 FINDINGS: Moderate right pleural effusion with interval progression. Progressive right lower lobe infiltrate. Pulmonary hyperinflation with COPD. No infiltrate or mass on the left. Heart size normal. Negative for heart failure. Atherosclerotic aortic arch. IMPRESSION: COPD. Progression of right pleural effusion and right lower lobe airspace disease. Possible chronic pneumonia however underlying neoplasm possible. Correlate with prior thoracentesis resolved. Consider follow-up CT chest with contrast. Electronically Signed   By: Franchot Gallo M.D.   On: 03/04/2019 14:54   CT Abdomen Pelvis W Contrast  Result Date: 03/04/2019 CLINICAL DATA:  Lower abdominal pain, nausea and vomiting for 1 week. EXAM: CT ABDOMEN AND PELVIS WITH CONTRAST TECHNIQUE: Multidetector CT imaging of the abdomen and pelvis was performed using the standard protocol following bolus administration of intravenous contrast. CONTRAST:  87m OMNIPAQUE IOHEXOL 300 MG/ML  SOLN COMPARISON:  10/27/2018 FINDINGS: Lower chest: Persistent right pleural effusion with progressive right lower lobe atelectasis. Scattered calcifications are noted in the atelectatic lung. These appear new and could be due to chronic inflammation or aspiration. I do not see any obvious pleural lesions possible areas of vague pleural enhancement. Thoracentesis may be helpful for diagnostic purposes. The left lung base is grossly clear. Minimal dependent subpleural atelectasis. The heart is normal in size. No pericardial effusion.  Stable coronary artery and aortic calcifications. Borderline enlarged subcarinal node measuring 9 mm. Hepatobiliary: No focal hepatic lesions or intrahepatic biliary dilatation. The gallbladder is mildly dilated and contains a 18 mm rim calcified calculus. No gallbladder wall thickening or pericholecystic inflammatory changes to suggest acute cholecystitis. No common bile duct dilatation. Pancreas: No mass, inflammation or ductal dilatation. Small duodenal diverticulum noted near the pancreatic head. Spleen: Normal size. No focal lesions. Adrenals/Urinary Tract: Stable low-attenuation nodule involving the medial limb of the left adrenal gland consistent with a benign adenoma. No worrisome renal lesions or hydronephrosis. No renal or obstructing ureteral calculi. The bladder is unremarkable. No bladder mass or asymmetric bladder wall thickening. Mild prostate gland enlargement with median lobe hypertrophy impressing on the base of the bladder. Stomach/Bowel: The stomach, duodenum, small bowel and colon are grossly normal without oral contrast. No acute inflammatory changes, mass lesions or obstructive findings. The terminal ileum and appendix are normal. Vascular/Lymphatic: Advanced atherosclerotic calcifications involving the aorta, iliac arteries and branch vessels. No focal aneurysm or dissection. The major venous structures are patent. No mesenteric or retroperitoneal mass or adenopathy. Reproductive: Mild prostate gland enlargement with median lobe hypertrophy impressing on the base of the bladder. The seminal vesicles appear normal. Other: Small periumbilical abdominal wall hernia containing fat is stable. No pelvic mass or pelvic adenopathy. No free pelvic fluid collections. Musculoskeletal: Vertebral augmentation changes are noted at L2 and L3, new since prior study. No new spine fractures are identified. The hips are normally located. No worrisome bone lesions. IMPRESSION: 1. No acute abdominal/pelvic  findings, mass lesions or lymphadenopathy. 2. Persistent right pleural effusion with progressive right lower lobe atelectasis. There are also calcifications in the atelectatic lung which could be due to chronic inflammation or aspiration. Thoracentesis may be helpful for diagnostic purposes. Vague areas of pleural enhancement are suspected. 3. Cholelithiasis.  No CT  findings for acute cholecystitis. 4. Advanced atherosclerotic calcifications involving the aorta and branch vessels. 5. Stable left adrenal gland adenoma. 6. Vertebral augmentation changes at L2 and L3. Aortic Atherosclerosis (ICD10-I70.0). Electronically Signed   By: Marijo Sanes M.D.   On: 03/04/2019 13:46   DG Chest Port 1 View  Result Date: 03/05/2019 CLINICAL DATA:  Post right-sided thoracentesis EXAM: PORTABLE CHEST 1 VIEW COMPARISON:  Chest radiograph-03/04/2019; chest CT-10/07/2018; CT abdomen pelvis-03/04/2019 FINDINGS: Grossly unchanged cardiac silhouette and mediastinal contours with partial obscuration of the right heart border secondary to heterogeneous/consolidative opacities, as demonstrated on preceding abdominal CT. Atherosclerotic plaque within thoracic aorta. Interval reduction/resolution of right-sided pleural effusion post thoracentesis with development of a loculated right basilar ex vacuo hydropneumothorax. There is no definitive apical component of this ex vacuo pneumothorax. No mediastinal shift. The left hemithorax remains well aerated. No left-sided pleural effusion. No evidence of edema. No acute osseous abnormalities. IMPRESSION: 1. Interval reduction/resolution of right-sided pleural effusion post thoracentesis with development of a loculated right basilar ex vacuo hydropneumothorax. 2. Partial obscuration of right heart border secondary to right perihilar heterogeneous/consolidative opacities as demonstrated on preceding abdominal CT. Electronically Signed   By: Sandi Mariscal M.D.   On: 03/05/2019 10:23   US  THORACENTESIS ASP PLEURAL SPACE W/IMG GUIDE  Result Date: 03/05/2019 INDICATION: Symptomatic right-sided pleural effusion. Please perform ultrasound-guided thoracentesis for diagnostic and therapeutic purposes. EXAM: US THORACENTESIS ASP PLEURAL SPACE W/IMG GUIDE COMPARISON:  Chest radiograph-earlier same day; CT abdomen pelvis-03/04/2019 Ultrasound-guided right-sided thoracentesis-08/08/2018; 07/10/2018 MEDICATIONS: None. COMPLICATIONS: SIR Level A - No therapy, no consequence. Procedure complicated by development of an asymptomatic right basilar ex vacuo hydropneumothorax. TECHNIQUE: Informed written consent was obtained from the patient after a discussion of the risks, benefits and alternatives to treatment. A timeout was performed prior to the initiation of the procedure. Initial ultrasound scanning demonstrates a moderate sized anechoic right-sided pleural effusion. The lower chest was prepped and draped in the usual sterile fashion. 1% lidocaine was used for local anesthesia. An ultrasound image was saved for documentation purposes. An 8 Fr Safe-T-Centesis catheter was introduced. The thoracentesis was performed. The catheter was removed and a dressing was applied. The patient tolerated the procedure well without immediate post procedural complication. The patient was escorted to have an upright chest radiograph. FINDINGS: A total of approximately 700 cc of serous, slightly blood tinged fluid was removed. Requested samples were sent to the laboratory. IMPRESSION: Successful ultrasound-guided right sided thoracentesis yielding 700 cc of pleural fluid. Electronically Signed   By: Sandi Mariscal M.D.   On: 03/05/2019 11:45      ASSESSMENT & PLAN:  1. Anemia of chronic renal failure, stage 4 (severe) (HCC)    #Anemia of chronic kidney disease, underlying MDS cannot be ruled out.  Patient declines bone marrow biopsy due to multiple other medical problems. Labs reviewed and discussed with  patient. Hypertension is better controlled. Proceed with Retacrit 30,000 units today. Continue follow-up every 4 weeks for Retacrit injections.  All questions were answered. The patient knows to call the clinic with any problems questions or concerns.   Earlie Server, MD, PhD Hematology Oncology [facility] Pager- [phone] 04/02/2019

## 2019-04-03 DIAGNOSIS — J441 Chronic obstructive pulmonary disease with (acute) exacerbation: Secondary | ICD-10-CM | POA: Diagnosis not present

## 2019-04-03 LAB — FUNGUS CULTURE RESULT

## 2019-04-03 LAB — FUNGAL ORGANISM REFLEX

## 2019-04-03 LAB — FUNGUS CULTURE WITH STAIN

## 2019-04-09 DIAGNOSIS — N183 Chronic kidney disease, stage 3 unspecified: Secondary | ICD-10-CM | POA: Diagnosis not present

## 2019-04-09 DIAGNOSIS — K5900 Constipation, unspecified: Secondary | ICD-10-CM | POA: Diagnosis not present

## 2019-04-09 DIAGNOSIS — J449 Chronic obstructive pulmonary disease, unspecified: Secondary | ICD-10-CM | POA: Diagnosis not present

## 2019-04-09 DIAGNOSIS — I130 Hypertensive heart and chronic kidney disease with heart failure and stage 1 through stage 4 chronic kidney disease, or unspecified chronic kidney disease: Secondary | ICD-10-CM | POA: Diagnosis not present

## 2019-04-09 DIAGNOSIS — E785 Hyperlipidemia, unspecified: Secondary | ICD-10-CM | POA: Diagnosis not present

## 2019-04-09 DIAGNOSIS — Z4789 Encounter for other orthopedic aftercare: Secondary | ICD-10-CM | POA: Diagnosis not present

## 2019-04-09 DIAGNOSIS — I509 Heart failure, unspecified: Secondary | ICD-10-CM | POA: Diagnosis not present

## 2019-04-09 DIAGNOSIS — E1122 Type 2 diabetes mellitus with diabetic chronic kidney disease: Secondary | ICD-10-CM | POA: Diagnosis not present

## 2019-04-09 DIAGNOSIS — D631 Anemia in chronic kidney disease: Secondary | ICD-10-CM | POA: Diagnosis not present

## 2019-04-10 DIAGNOSIS — E1165 Type 2 diabetes mellitus with hyperglycemia: Secondary | ICD-10-CM | POA: Diagnosis not present

## 2019-04-11 DIAGNOSIS — Z4789 Encounter for other orthopedic aftercare: Secondary | ICD-10-CM | POA: Diagnosis not present

## 2019-04-11 DIAGNOSIS — I130 Hypertensive heart and chronic kidney disease with heart failure and stage 1 through stage 4 chronic kidney disease, or unspecified chronic kidney disease: Secondary | ICD-10-CM | POA: Diagnosis not present

## 2019-04-11 DIAGNOSIS — I509 Heart failure, unspecified: Secondary | ICD-10-CM | POA: Diagnosis not present

## 2019-04-11 DIAGNOSIS — K5900 Constipation, unspecified: Secondary | ICD-10-CM | POA: Diagnosis not present

## 2019-04-11 DIAGNOSIS — E1122 Type 2 diabetes mellitus with diabetic chronic kidney disease: Secondary | ICD-10-CM | POA: Diagnosis not present

## 2019-04-11 DIAGNOSIS — J449 Chronic obstructive pulmonary disease, unspecified: Secondary | ICD-10-CM | POA: Diagnosis not present

## 2019-04-11 DIAGNOSIS — N183 Chronic kidney disease, stage 3 unspecified: Secondary | ICD-10-CM | POA: Diagnosis not present

## 2019-04-11 DIAGNOSIS — D631 Anemia in chronic kidney disease: Secondary | ICD-10-CM | POA: Diagnosis not present

## 2019-04-11 DIAGNOSIS — E785 Hyperlipidemia, unspecified: Secondary | ICD-10-CM | POA: Diagnosis not present

## 2019-04-14 DIAGNOSIS — E1122 Type 2 diabetes mellitus with diabetic chronic kidney disease: Secondary | ICD-10-CM | POA: Diagnosis not present

## 2019-04-14 DIAGNOSIS — Z4789 Encounter for other orthopedic aftercare: Secondary | ICD-10-CM | POA: Diagnosis not present

## 2019-04-14 DIAGNOSIS — D631 Anemia in chronic kidney disease: Secondary | ICD-10-CM | POA: Diagnosis not present

## 2019-04-14 DIAGNOSIS — N183 Chronic kidney disease, stage 3 unspecified: Secondary | ICD-10-CM | POA: Diagnosis not present

## 2019-04-14 DIAGNOSIS — I130 Hypertensive heart and chronic kidney disease with heart failure and stage 1 through stage 4 chronic kidney disease, or unspecified chronic kidney disease: Secondary | ICD-10-CM | POA: Diagnosis not present

## 2019-04-14 DIAGNOSIS — E785 Hyperlipidemia, unspecified: Secondary | ICD-10-CM | POA: Diagnosis not present

## 2019-04-14 DIAGNOSIS — I509 Heart failure, unspecified: Secondary | ICD-10-CM | POA: Diagnosis not present

## 2019-04-14 DIAGNOSIS — K5900 Constipation, unspecified: Secondary | ICD-10-CM | POA: Diagnosis not present

## 2019-04-14 DIAGNOSIS — J449 Chronic obstructive pulmonary disease, unspecified: Secondary | ICD-10-CM | POA: Diagnosis not present

## 2019-04-16 DIAGNOSIS — N183 Chronic kidney disease, stage 3 unspecified: Secondary | ICD-10-CM | POA: Diagnosis not present

## 2019-04-16 DIAGNOSIS — Z4789 Encounter for other orthopedic aftercare: Secondary | ICD-10-CM | POA: Diagnosis not present

## 2019-04-16 DIAGNOSIS — E1122 Type 2 diabetes mellitus with diabetic chronic kidney disease: Secondary | ICD-10-CM | POA: Diagnosis not present

## 2019-04-16 DIAGNOSIS — K5900 Constipation, unspecified: Secondary | ICD-10-CM | POA: Diagnosis not present

## 2019-04-16 DIAGNOSIS — I509 Heart failure, unspecified: Secondary | ICD-10-CM | POA: Diagnosis not present

## 2019-04-16 DIAGNOSIS — D631 Anemia in chronic kidney disease: Secondary | ICD-10-CM | POA: Diagnosis not present

## 2019-04-16 DIAGNOSIS — J449 Chronic obstructive pulmonary disease, unspecified: Secondary | ICD-10-CM | POA: Diagnosis not present

## 2019-04-16 DIAGNOSIS — I130 Hypertensive heart and chronic kidney disease with heart failure and stage 1 through stage 4 chronic kidney disease, or unspecified chronic kidney disease: Secondary | ICD-10-CM | POA: Diagnosis not present

## 2019-04-16 DIAGNOSIS — E785 Hyperlipidemia, unspecified: Secondary | ICD-10-CM | POA: Diagnosis not present

## 2019-04-17 DIAGNOSIS — I129 Hypertensive chronic kidney disease with stage 1 through stage 4 chronic kidney disease, or unspecified chronic kidney disease: Secondary | ICD-10-CM | POA: Diagnosis not present

## 2019-04-17 DIAGNOSIS — J9611 Chronic respiratory failure with hypoxia: Secondary | ICD-10-CM | POA: Diagnosis not present

## 2019-04-17 DIAGNOSIS — E1122 Type 2 diabetes mellitus with diabetic chronic kidney disease: Secondary | ICD-10-CM | POA: Diagnosis not present

## 2019-04-17 DIAGNOSIS — N1832 Chronic kidney disease, stage 3b: Secondary | ICD-10-CM | POA: Diagnosis not present

## 2019-04-17 DIAGNOSIS — Z0001 Encounter for general adult medical examination with abnormal findings: Secondary | ICD-10-CM | POA: Diagnosis not present

## 2019-04-17 DIAGNOSIS — Z9989 Dependence on other enabling machines and devices: Secondary | ICD-10-CM | POA: Diagnosis not present

## 2019-04-17 DIAGNOSIS — Z87891 Personal history of nicotine dependence: Secondary | ICD-10-CM | POA: Diagnosis not present

## 2019-04-17 DIAGNOSIS — Z0000 Encounter for general adult medical examination without abnormal findings: Secondary | ICD-10-CM | POA: Diagnosis not present

## 2019-04-17 DIAGNOSIS — J431 Panlobular emphysema: Secondary | ICD-10-CM | POA: Diagnosis not present

## 2019-04-22 DIAGNOSIS — D631 Anemia in chronic kidney disease: Secondary | ICD-10-CM | POA: Diagnosis not present

## 2019-04-22 DIAGNOSIS — N183 Chronic kidney disease, stage 3 unspecified: Secondary | ICD-10-CM | POA: Diagnosis not present

## 2019-04-22 DIAGNOSIS — J449 Chronic obstructive pulmonary disease, unspecified: Secondary | ICD-10-CM | POA: Diagnosis not present

## 2019-04-22 DIAGNOSIS — Z4789 Encounter for other orthopedic aftercare: Secondary | ICD-10-CM | POA: Diagnosis not present

## 2019-04-22 DIAGNOSIS — E785 Hyperlipidemia, unspecified: Secondary | ICD-10-CM | POA: Diagnosis not present

## 2019-04-22 DIAGNOSIS — I130 Hypertensive heart and chronic kidney disease with heart failure and stage 1 through stage 4 chronic kidney disease, or unspecified chronic kidney disease: Secondary | ICD-10-CM | POA: Diagnosis not present

## 2019-04-22 DIAGNOSIS — E1122 Type 2 diabetes mellitus with diabetic chronic kidney disease: Secondary | ICD-10-CM | POA: Diagnosis not present

## 2019-04-22 DIAGNOSIS — K5900 Constipation, unspecified: Secondary | ICD-10-CM | POA: Diagnosis not present

## 2019-04-22 DIAGNOSIS — I509 Heart failure, unspecified: Secondary | ICD-10-CM | POA: Diagnosis not present

## 2019-04-23 DIAGNOSIS — B351 Tinea unguium: Secondary | ICD-10-CM | POA: Diagnosis not present

## 2019-04-23 DIAGNOSIS — E1142 Type 2 diabetes mellitus with diabetic polyneuropathy: Secondary | ICD-10-CM | POA: Diagnosis not present

## 2019-04-24 DIAGNOSIS — I509 Heart failure, unspecified: Secondary | ICD-10-CM | POA: Diagnosis not present

## 2019-04-24 DIAGNOSIS — K5900 Constipation, unspecified: Secondary | ICD-10-CM | POA: Diagnosis not present

## 2019-04-24 DIAGNOSIS — Z4789 Encounter for other orthopedic aftercare: Secondary | ICD-10-CM | POA: Diagnosis not present

## 2019-04-24 DIAGNOSIS — D631 Anemia in chronic kidney disease: Secondary | ICD-10-CM | POA: Diagnosis not present

## 2019-04-24 DIAGNOSIS — N183 Chronic kidney disease, stage 3 unspecified: Secondary | ICD-10-CM | POA: Diagnosis not present

## 2019-04-24 DIAGNOSIS — I130 Hypertensive heart and chronic kidney disease with heart failure and stage 1 through stage 4 chronic kidney disease, or unspecified chronic kidney disease: Secondary | ICD-10-CM | POA: Diagnosis not present

## 2019-04-24 DIAGNOSIS — J449 Chronic obstructive pulmonary disease, unspecified: Secondary | ICD-10-CM | POA: Diagnosis not present

## 2019-04-24 DIAGNOSIS — E1122 Type 2 diabetes mellitus with diabetic chronic kidney disease: Secondary | ICD-10-CM | POA: Diagnosis not present

## 2019-04-24 DIAGNOSIS — E785 Hyperlipidemia, unspecified: Secondary | ICD-10-CM | POA: Diagnosis not present

## 2019-04-28 DIAGNOSIS — J441 Chronic obstructive pulmonary disease with (acute) exacerbation: Secondary | ICD-10-CM | POA: Diagnosis not present

## 2019-04-29 DIAGNOSIS — D631 Anemia in chronic kidney disease: Secondary | ICD-10-CM | POA: Diagnosis not present

## 2019-04-29 DIAGNOSIS — I509 Heart failure, unspecified: Secondary | ICD-10-CM | POA: Diagnosis not present

## 2019-04-29 DIAGNOSIS — K5900 Constipation, unspecified: Secondary | ICD-10-CM | POA: Diagnosis not present

## 2019-04-29 DIAGNOSIS — N183 Chronic kidney disease, stage 3 unspecified: Secondary | ICD-10-CM | POA: Diagnosis not present

## 2019-04-29 DIAGNOSIS — I130 Hypertensive heart and chronic kidney disease with heart failure and stage 1 through stage 4 chronic kidney disease, or unspecified chronic kidney disease: Secondary | ICD-10-CM | POA: Diagnosis not present

## 2019-04-29 DIAGNOSIS — E1122 Type 2 diabetes mellitus with diabetic chronic kidney disease: Secondary | ICD-10-CM | POA: Diagnosis not present

## 2019-04-29 DIAGNOSIS — Z4789 Encounter for other orthopedic aftercare: Secondary | ICD-10-CM | POA: Diagnosis not present

## 2019-04-29 DIAGNOSIS — E785 Hyperlipidemia, unspecified: Secondary | ICD-10-CM | POA: Diagnosis not present

## 2019-04-29 DIAGNOSIS — J449 Chronic obstructive pulmonary disease, unspecified: Secondary | ICD-10-CM | POA: Diagnosis not present

## 2019-04-30 ENCOUNTER — Other Ambulatory Visit: Payer: Self-pay

## 2019-04-30 ENCOUNTER — Inpatient Hospital Stay (HOSPITAL_BASED_OUTPATIENT_CLINIC_OR_DEPARTMENT_OTHER): Payer: Medicare HMO | Admitting: Oncology

## 2019-04-30 ENCOUNTER — Inpatient Hospital Stay: Payer: Medicare HMO

## 2019-04-30 ENCOUNTER — Inpatient Hospital Stay: Payer: Medicare HMO | Attending: Oncology

## 2019-04-30 ENCOUNTER — Encounter: Payer: Self-pay | Admitting: Oncology

## 2019-04-30 VITALS — BP 112/56 | HR 97 | Temp 97.6°F | Resp 18

## 2019-04-30 DIAGNOSIS — N184 Chronic kidney disease, stage 4 (severe): Secondary | ICD-10-CM

## 2019-04-30 DIAGNOSIS — Z794 Long term (current) use of insulin: Secondary | ICD-10-CM | POA: Diagnosis not present

## 2019-04-30 DIAGNOSIS — Z79899 Other long term (current) drug therapy: Secondary | ICD-10-CM | POA: Insufficient documentation

## 2019-04-30 DIAGNOSIS — J9610 Chronic respiratory failure, unspecified whether with hypoxia or hypercapnia: Secondary | ICD-10-CM | POA: Insufficient documentation

## 2019-04-30 DIAGNOSIS — N189 Chronic kidney disease, unspecified: Secondary | ICD-10-CM

## 2019-04-30 DIAGNOSIS — E785 Hyperlipidemia, unspecified: Secondary | ICD-10-CM | POA: Insufficient documentation

## 2019-04-30 DIAGNOSIS — I129 Hypertensive chronic kidney disease with stage 1 through stage 4 chronic kidney disease, or unspecified chronic kidney disease: Secondary | ICD-10-CM | POA: Diagnosis not present

## 2019-04-30 DIAGNOSIS — R7989 Other specified abnormal findings of blood chemistry: Secondary | ICD-10-CM | POA: Insufficient documentation

## 2019-04-30 DIAGNOSIS — D631 Anemia in chronic kidney disease: Secondary | ICD-10-CM

## 2019-04-30 DIAGNOSIS — J449 Chronic obstructive pulmonary disease, unspecified: Secondary | ICD-10-CM | POA: Insufficient documentation

## 2019-04-30 DIAGNOSIS — Z87891 Personal history of nicotine dependence: Secondary | ICD-10-CM | POA: Diagnosis not present

## 2019-04-30 DIAGNOSIS — I130 Hypertensive heart and chronic kidney disease with heart failure and stage 1 through stage 4 chronic kidney disease, or unspecified chronic kidney disease: Secondary | ICD-10-CM | POA: Diagnosis not present

## 2019-04-30 DIAGNOSIS — I509 Heart failure, unspecified: Secondary | ICD-10-CM | POA: Insufficient documentation

## 2019-04-30 DIAGNOSIS — E1122 Type 2 diabetes mellitus with diabetic chronic kidney disease: Secondary | ICD-10-CM | POA: Diagnosis not present

## 2019-04-30 DIAGNOSIS — D509 Iron deficiency anemia, unspecified: Secondary | ICD-10-CM | POA: Diagnosis not present

## 2019-04-30 DIAGNOSIS — E1136 Type 2 diabetes mellitus with diabetic cataract: Secondary | ICD-10-CM | POA: Insufficient documentation

## 2019-04-30 LAB — CBC WITH DIFFERENTIAL/PLATELET
Abs Immature Granulocytes: 0.01 10*3/uL (ref 0.00–0.07)
Basophils Absolute: 0 10*3/uL (ref 0.0–0.1)
Basophils Relative: 1 %
Eosinophils Absolute: 0.1 10*3/uL (ref 0.0–0.5)
Eosinophils Relative: 2 %
HCT: 26.4 % — ABNORMAL LOW (ref 39.0–52.0)
Hemoglobin: 8.2 g/dL — ABNORMAL LOW (ref 13.0–17.0)
Immature Granulocytes: 0 %
Lymphocytes Relative: 27 %
Lymphs Abs: 1.4 10*3/uL (ref 0.7–4.0)
MCH: 29 pg (ref 26.0–34.0)
MCHC: 31.1 g/dL (ref 30.0–36.0)
MCV: 93.3 fL (ref 80.0–100.0)
Monocytes Absolute: 0.5 10*3/uL (ref 0.1–1.0)
Monocytes Relative: 9 %
Neutro Abs: 3.1 10*3/uL (ref 1.7–7.7)
Neutrophils Relative %: 61 %
Platelets: 286 10*3/uL (ref 150–400)
RBC: 2.83 MIL/uL — ABNORMAL LOW (ref 4.22–5.81)
RDW: 17.7 % — ABNORMAL HIGH (ref 11.5–15.5)
WBC: 5.1 10*3/uL (ref 4.0–10.5)
nRBC: 0.4 % — ABNORMAL HIGH (ref 0.0–0.2)

## 2019-04-30 LAB — TECHNOLOGIST SMEAR REVIEW: Plt Morphology: NORMAL

## 2019-04-30 LAB — RETIC PANEL
Immature Retic Fract: 9.8 % (ref 2.3–15.9)
RBC.: 2.84 MIL/uL — ABNORMAL LOW (ref 4.22–5.81)
Retic Count, Absolute: 29.1 10*3/uL (ref 19.0–186.0)
Retic Ct Pct: 1 % (ref 0.4–3.1)
Reticulocyte Hemoglobin: 26.7 pg — ABNORMAL LOW (ref >27.9)

## 2019-04-30 MED ORDER — EPOETIN ALFA-EPBX 10000 UNIT/ML IJ SOLN
20000.0000 [IU] | Freq: Once | INTRAMUSCULAR | Status: AC
  Administered 2019-04-30: 11:00:00 20000 [IU] via SUBCUTANEOUS
  Filled 2019-04-30: qty 2

## 2019-04-30 MED ORDER — EPOETIN ALFA-EPBX 40000 UNIT/ML IJ SOLN
40000.0000 [IU] | Freq: Once | INTRAMUSCULAR | Status: AC
  Administered 2019-04-30: 40000 [IU] via SUBCUTANEOUS
  Filled 2019-04-30: qty 1

## 2019-04-30 MED ORDER — EPOETIN ALFA-EPBX 40000 UNIT/ML IJ SOLN: 60000.0000 [IU] | Freq: Once | INTRAMUSCULAR | Status: DC

## 2019-04-30 NOTE — Progress Notes (Signed)
Hematology/Oncology  [facility] [phone] [phone]   Patient Care Team: Baxter Hire, MD as PCP - General (Internal Medicine) Earlie Server, MD as Consulting Physician (Oncology)  REFERRING PROVIDER: Baxter Hire, MD  CHIEF COMPLAINTS/REASON FOR VISIT:  Follow-up for anemia secondary to chronic kidney disease.  HISTORY OF PRESENTING ILLNESS:   Arthur [name] is a  86 y.o.  male with PMH listed below was seen in consultation at the request of  Baxter Hire, MD  for evaluation of anemia Patient has chronic comorbidities including diabetes, COPD/emphysema, chronic respiratory failure on home oxygen, CKD stage III, CHF, hypertension and hyperlipidemia. Reviewed patient's previous lab records. Extensive medical records review was performed by me. Anemia has been chronic, dated back to at least 2016.  At that time his hemoglobin was around 11-12. Hemoglobin started to trended down below 10 starting February 2020. 02/11/2018 hemoglobin 9, hematocrit 28.6, 06/17/2018 hemoglobin 7.3 hematocrit of 24, 06/27/2018 hemoglobin 5.8 07/15/2018 hemoglobin 7.5 07/29/2018 hemoglobin 8.2, 08/12/2018, hemoglobin 9.2   INTERVAL HISTORY Arthur [name] is a 86 y.o. male who has above history reviewed by me today presents for follow up visit for management of anemia Problems and complaints are listed below: Patient was here by himself. He currently resides in [place].  Patient was accompanied by his daughter. Patient reports continued to have chronic back pain. No new complaints today.  Per daughter.  Patient has stopped oral iron supplementation.  Review of Systems  Constitutional: Positive for fatigue. Negative for appetite change, chills, fever and unexpected weight change.  HENT:   Negative for hearing loss and voice change.   Eyes: Negative for eye problems and icterus.  Respiratory: Negative for chest tightness, cough and shortness of  breath.   Cardiovascular: Negative for chest pain and leg swelling.  Gastrointestinal: Negative for abdominal distention and abdominal pain.  Endocrine: Negative for hot flashes.  Genitourinary: Negative for difficulty urinating, dysuria and frequency.   Musculoskeletal: Negative for arthralgias.       Chronic back pain  Skin: Negative for itching and rash.  Neurological: Negative for light-headedness and numbness.  Hematological: Negative for adenopathy. Does not bruise/bleed easily.  Psychiatric/Behavioral: Negative for confusion.    MEDICAL HISTORY:  Past Medical History:  Diagnosis Date  . Anemia of chronic kidney failure 08/21/2018  . Arthritis    lower back  . CHF (congestive heart failure) ([place])   . CKD (chronic kidney disease), stage III   . COPD (chronic obstructive pulmonary disease) ([place])   . Diabetes mellitus without complication ([place])    type 2  . Dyspnea    when active -wears oxygen 2L via [place]  . History of blood transfusion   . Hyperlipidemia   . Hypertension   . Requires continuous at home supplemental oxygen    2L via [place]    SURGICAL HISTORY: Past Surgical History:  Procedure Laterality Date  . COLONOSCOPY WITH PROPOFOL N/A 06/11/2018   Procedure: COLONOSCOPY WITH PROPOFOL;  Surgeon: Jonathon Bellows, MD;  Location: [facility] ENDOSCOPY;  Service: Gastroenterology;  Laterality: N/A;  . COLONOSCOPY WITH PROPOFOL N/A 06/13/2018   Procedure: COLONOSCOPY WITH PROPOFOL;  Surgeon: Jonathon Bellows, MD;  Location: [facility] ENDOSCOPY;  Service: Gastroenterology;  Laterality: N/A;  . ESOPHAGOGASTRODUODENOSCOPY Left 06/10/2018   Procedure: ESOPHAGOGASTRODUODENOSCOPY (EGD);  Surgeon: Jonathon Bellows, MD;  Location: [facility] ENDOSCOPY;  Service: Gastroenterology;  Laterality: Left;  . EYE SURGERY     removed cataracts  . GIVENS CAPSULE STUDY N/A 06/28/2018  Procedure: GIVENS CAPSULE STUDY;  Surgeon: Lucilla Lame, MD;  Location: [facility] ENDOSCOPY;  Service: Endoscopy;  Laterality: N/A;  . GIVENS CAPSULE  STUDY N/A 06/30/2018   Procedure: GIVENS CAPSULE STUDY;  Surgeon: Jonathon Bellows, MD;  Location: [facility] ENDOSCOPY;  Service: Gastroenterology;  Laterality: N/A;  . KYPHOPLASTY N/A 10/31/2018   Procedure: KYPHOPLASTY L2;  Surgeon: [name], MD;  Location: [facility] ORS;  Service: Orthopedics;  Laterality: N/A;  . KYPHOPLASTY N/A 12/24/2018   Procedure: KYPHOPLASTY L3;  Surgeon: [name], MD;  Location: [facility] ORS;  Service: Orthopedics;  Laterality: N/A;  . KYPHOPLASTY N/A 02/18/2019   Procedure: L2 KYPHOPLASTY;  Surgeon: [name], MD;  Location: [facility] ORS;  Service: Orthopedics;  Laterality: N/A;  . RADIOLOGY WITH ANESTHESIA N/A 01/28/2019   Procedure: MRI LUMBER SPINE WITHOUT CONTRAST;  Surgeon: Radiologist, Medication, MD;  Location: [place];  Service: Radiology;  Laterality: N/A;    SOCIAL HISTORY: Social History   Socioeconomic History  . Marital status: Widowed    Spouse name: Not on file  . Number of children: Not on file  . Years of education: Not on file  . Highest education level: Not on file  Occupational History  . Not on file  Tobacco Use  . Smoking status: Former Smoker    Packs/day: 2.00    Years: 35.00    Pack years: 70.00    Types: Cigarettes    Quit date: 11/04/1982    Years since quitting: 36.5  . Smokeless tobacco: Current User    Types: Chew  Substance and Sexual Activity  . Alcohol use: Not Currently  . Drug use: No  . Sexual activity: Not Currently  Other Topics Concern  . Not on file  Social History Narrative  . Not on file   Social Determinants of Health   Financial Resource Strain: Low Risk   . Difficulty of Paying Living Expenses: Not hard at all  Food Insecurity: No Food Insecurity  . Worried About Charity fundraiser in the Last Year: Never true  . Ran Out of Food in the Last Year: Never true  Transportation Needs: No Transportation Needs  . Lack of Transportation (Medical): No  . Lack of Transportation (Non-Medical): No  Physical Activity:  Unknown  . Days of Exercise per Week: 0 days  . Minutes of Exercise per Session: Not on file  Stress: No Stress Concern Present  . Feeling of Stress : Not at all  Social Connections:   . Frequency of Communication with Friends and Family:   . Frequency of Social Gatherings with Friends and Family:   . Attends Religious Services:   . Active Member of Clubs or Organizations:   . Attends Archivist Meetings:   Marland Kitchen Marital Status:   Intimate Partner Violence: Unknown  . Fear of Current or Ex-Partner: Patient refused  . Emotionally Abused: Patient refused  . Physically Abused: Patient refused  . Sexually Abused: Patient refused    FAMILY HISTORY: Family History  Problem Relation Age of Onset  . COPD Mother   . Cancer Father     ALLERGIES:  is allergic to codeine.  MEDICATIONS:  Current Outpatient Medications  Medication Sig Dispense Refill  . acetaminophen (TYLENOL) 325 MG tablet Take 2 tablets (650 mg total) by mouth every 6 (six) hours as needed for mild pain (or Fever >/= 101).    Marland Kitchen albuterol (VENTOLIN HFA) 108 (90 Base) MCG/ACT inhaler Inhale 2 puffs into the lungs every 6 (six) hours as needed for  wheezing or shortness of breath. 1 Inhaler 2  . amLODipine (NORVASC) 2.5 MG tablet Take 2.5 mg by mouth daily.    Marland Kitchen ascorbic acid (VITAMIN C) 500 MG tablet Take 500 mg by mouth at bedtime.    . docusate sodium (COLACE) 100 MG capsule Take 1 capsule (100 mg total) by mouth 2 (two) times daily. 60 capsule 0  . insulin aspart (NOVOLOG) 100 UNIT/ML injection 4 units prior to meals if sugars above 200 10 mL 11  . LEVEMIR 100 UNIT/ML injection Inject 0.04 mLs (4 Units total) into the skin at bedtime. 10 mL 0  . pantoprazole (PROTONIX) 40 MG tablet Take 1 tablet (40 mg total) by mouth daily.    . polyethylene glycol powder (GLYCOLAX/MIRALAX) 17 GM/SCOOP powder Take 17 g by mouth every other day.     . potassium chloride (KLOR-CON) 10 MEQ tablet Take 10 mEq by mouth every evening.      . pravastatin (PRAVACHOL) 10 MG tablet Take 10 mg by mouth at bedtime.    . tamsulosin (FLOMAX) 0.4 MG CAPS capsule Take 0.4 mg by mouth daily.    Marland Kitchen tiotropium (SPIRIVA) 18 MCG inhalation capsule Place 18 mcg into inhaler and inhale daily.    Marland Kitchen torsemide (DEMADEX) 20 MG tablet Take 20 mg by mouth every Monday, Wednesday, and Friday.    Marland Kitchen HYDROcodone-acetaminophen (NORCO) 5-325 MG tablet Take 1 tablet by mouth every 6 (six) hours as needed for moderate pain or severe pain. (Patient not taking: Reported on 04/30/2019) 9 tablet 0  . metoCLOPramide (REGLAN) 5 MG tablet Take 5 mg by mouth 4 (four) times daily -  before meals and at bedtime.    . promethazine (PHENERGAN) 25 MG tablet Take 25 mg by mouth every 4 (four) hours as needed for nausea or vomiting.     No current facility-administered medications for this visit.     PHYSICAL EXAMINATION: ECOG PERFORMANCE STATUS: 3 - Symptomatic, >50% confined to bed Vitals:   04/30/19 0949  BP: (!) 112/56  Pulse: 97  Resp: 18  Temp: 97.6 F (36.4 C)   There were no vitals filed for this visit.  Physical Exam Constitutional:      General: He is not in acute distress.    Appearance: He is ill-appearing.     Comments: Sits in wheel chair  HENT:     Head: Normocephalic and atraumatic.  Eyes:     General: No scleral icterus.    Pupils: Pupils are equal, round, and reactive to light.  Cardiovascular:     Rate and Rhythm: Normal rate and regular rhythm.     Heart sounds: Normal heart sounds.  Pulmonary:     Effort: Pulmonary effort is normal. No respiratory distress.     Breath sounds: No wheezing.     Comments: On nasal cannula oxygen. Bibasilar crackles. Abdominal:     General: Bowel sounds are normal. There is no distension.     Palpations: Abdomen is soft. There is no mass.     Tenderness: There is no abdominal tenderness.  Musculoskeletal:        General: No deformity. Normal range of motion.     Cervical back: Normal range of motion  and neck supple.     Comments: Chronic bilateral lower extremity edema.   Skin:    General: Skin is warm and dry.     Coloration: Skin is not pale.     Findings: No erythema or rash.  Neurological:  Mental Status: He is alert and oriented to person, place, and time. Mental status is at baseline.     Cranial Nerves: No cranial nerve deficit.     Coordination: Coordination normal.  Psychiatric:        Mood and Affect: Mood normal.     LABORATORY DATA:  I have reviewed the data as listed Lab Results  Component Value Date   WBC 5.1 04/30/2019   HGB 8.2 (L) 04/30/2019   HCT 26.4 (L) 04/30/2019   MCV 93.3 04/30/2019   PLT 286 04/30/2019   Recent Labs    01/23/19 0826 01/28/19 0828 02/21/19 1847 02/21/19 1847 03/04/19 1220 03/05/19 0820 03/07/19 0722 03/08/19 0959 03/10/19 0538  NA  --    < > 135   < > 133*   < > 132* 131* 130*  K  --    < > 4.2   < > 3.3*   < > 3.8 3.7 3.9  CL  --    < > 94*   < > 95*   < > 95* 91* 93*  CO2  --    < > 29   < > 28   < > 29 30 32  GLUCOSE  --    < > 120*   < > 161*   < > 151* 195* 116*  BUN  --    < > 30*   < > 18   < > 34* 36* 27*  CREATININE  --    < > 1.74*   < > 1.54*   < > 1.94* 1.73* 1.59*  CALCIUM  --    < > 8.6*   < > 8.3*   < > 8.1* 8.1* 8.3*  GFRNONAA  --    < > 35*   < > 41*   < > 31* 36* 40*  GFRAA  --    < > 41*   < > 48*   < > 36* 41* 46*  PROT 7.8  --  7.2  --  6.9  --   --   --   --   ALBUMIN 3.2*  --  3.2*  --  3.3*  --   --   --   --   AST 13*  --  11*  --  9*  --   --   --   --   ALT 12  --  6  --  8  --   --   --   --   ALKPHOS 77  --  67  --  73  --   --   --   --   BILITOT 0.6  --  0.6  --  0.8  --   --   --   --   BILIDIR <0.1  --   --   --   --   --   --   --   --   IBILI NOT CALCULATED  --   --   --   --   --   --   --   --    < > = values in this interval not displayed.   Iron/TIBC/Ferritin/ %Sat    Component Value Date/Time   IRON 29 (L) 03/18/2019 0910   IRON 36 (L) 07/15/2018 1544   TIBC 158 (L)  03/18/2019 0910   TIBC 172 (L) 07/15/2018 1544   FERRITIN 759 (H) 03/18/2019 0910   FERRITIN 535 (H) 07/15/2018 1544   IRONPCTSAT  18 03/18/2019 0910   IRONPCTSAT 21 07/15/2018 1544      RADIOGRAPHIC STUDIES: I have personally reviewed the radiological images as listed and agreed with the findings in the report.  No results found.    ASSESSMENT & PLAN:  1. Anemia of chronic renal failure, unspecified CKD stage   2. CKD (chronic kidney disease) stage 4, GFR 15-29 ml/min (HCC)   3. Iron deficiency anemia, unspecified iron deficiency anemia type    #Anemia of chronic kidney disease, underlying MDS cannot be ruled out.  Patient declines bone marrow biopsy due to multiple other medical problems. Labs reviewed and discussed with patient. Hemoglobin has dropped to 8.2 since last visit. Patient will proceed with Retacrit 60,000 unit today. We will check her iron level at the next visit.  Reticulocyte panel showed decreased reticulocyte hemoglobin, indicating underlying iron deficiency. Elevated ferritin is likely due to chronic inflammatory process. I advised patient to resume oral iron supplementation. Continue follow-up every 4 weeks for Retacrit injections.  All questions were answered. The patient knows to call the clinic with any problems questions or concerns.   Earlie Server, MD, PhD Hematology Oncology [facility] Pager- [phone] 04/30/2019

## 2019-04-30 NOTE — Progress Notes (Signed)
Patient is home for assisted living facility and reports he is doing much better at home.

## 2019-05-01 DIAGNOSIS — E1122 Type 2 diabetes mellitus with diabetic chronic kidney disease: Secondary | ICD-10-CM | POA: Diagnosis not present

## 2019-05-01 DIAGNOSIS — I130 Hypertensive heart and chronic kidney disease with heart failure and stage 1 through stage 4 chronic kidney disease, or unspecified chronic kidney disease: Secondary | ICD-10-CM | POA: Diagnosis not present

## 2019-05-01 DIAGNOSIS — K5900 Constipation, unspecified: Secondary | ICD-10-CM | POA: Diagnosis not present

## 2019-05-01 DIAGNOSIS — D631 Anemia in chronic kidney disease: Secondary | ICD-10-CM | POA: Diagnosis not present

## 2019-05-01 DIAGNOSIS — N183 Chronic kidney disease, stage 3 unspecified: Secondary | ICD-10-CM | POA: Diagnosis not present

## 2019-05-01 DIAGNOSIS — Z4789 Encounter for other orthopedic aftercare: Secondary | ICD-10-CM | POA: Diagnosis not present

## 2019-05-01 DIAGNOSIS — J449 Chronic obstructive pulmonary disease, unspecified: Secondary | ICD-10-CM | POA: Diagnosis not present

## 2019-05-01 DIAGNOSIS — E785 Hyperlipidemia, unspecified: Secondary | ICD-10-CM | POA: Diagnosis not present

## 2019-05-01 DIAGNOSIS — I509 Heart failure, unspecified: Secondary | ICD-10-CM | POA: Diagnosis not present

## 2019-05-03 DIAGNOSIS — J441 Chronic obstructive pulmonary disease with (acute) exacerbation: Secondary | ICD-10-CM | POA: Diagnosis not present

## 2019-05-06 DIAGNOSIS — D631 Anemia in chronic kidney disease: Secondary | ICD-10-CM | POA: Diagnosis not present

## 2019-05-06 DIAGNOSIS — I130 Hypertensive heart and chronic kidney disease with heart failure and stage 1 through stage 4 chronic kidney disease, or unspecified chronic kidney disease: Secondary | ICD-10-CM | POA: Diagnosis not present

## 2019-05-06 DIAGNOSIS — I509 Heart failure, unspecified: Secondary | ICD-10-CM | POA: Diagnosis not present

## 2019-05-06 DIAGNOSIS — E785 Hyperlipidemia, unspecified: Secondary | ICD-10-CM | POA: Diagnosis not present

## 2019-05-06 DIAGNOSIS — N183 Chronic kidney disease, stage 3 unspecified: Secondary | ICD-10-CM | POA: Diagnosis not present

## 2019-05-06 DIAGNOSIS — J449 Chronic obstructive pulmonary disease, unspecified: Secondary | ICD-10-CM | POA: Diagnosis not present

## 2019-05-06 DIAGNOSIS — K5900 Constipation, unspecified: Secondary | ICD-10-CM | POA: Diagnosis not present

## 2019-05-06 DIAGNOSIS — E1122 Type 2 diabetes mellitus with diabetic chronic kidney disease: Secondary | ICD-10-CM | POA: Diagnosis not present

## 2019-05-06 DIAGNOSIS — Z4789 Encounter for other orthopedic aftercare: Secondary | ICD-10-CM | POA: Diagnosis not present

## 2019-05-14 DIAGNOSIS — J449 Chronic obstructive pulmonary disease, unspecified: Secondary | ICD-10-CM | POA: Diagnosis not present

## 2019-05-14 DIAGNOSIS — D631 Anemia in chronic kidney disease: Secondary | ICD-10-CM | POA: Diagnosis not present

## 2019-05-14 DIAGNOSIS — N183 Chronic kidney disease, stage 3 unspecified: Secondary | ICD-10-CM | POA: Diagnosis not present

## 2019-05-14 DIAGNOSIS — E785 Hyperlipidemia, unspecified: Secondary | ICD-10-CM | POA: Diagnosis not present

## 2019-05-14 DIAGNOSIS — E1122 Type 2 diabetes mellitus with diabetic chronic kidney disease: Secondary | ICD-10-CM | POA: Diagnosis not present

## 2019-05-14 DIAGNOSIS — K5900 Constipation, unspecified: Secondary | ICD-10-CM | POA: Diagnosis not present

## 2019-05-14 DIAGNOSIS — I509 Heart failure, unspecified: Secondary | ICD-10-CM | POA: Diagnosis not present

## 2019-05-14 DIAGNOSIS — I130 Hypertensive heart and chronic kidney disease with heart failure and stage 1 through stage 4 chronic kidney disease, or unspecified chronic kidney disease: Secondary | ICD-10-CM | POA: Diagnosis not present

## 2019-05-14 DIAGNOSIS — Z4789 Encounter for other orthopedic aftercare: Secondary | ICD-10-CM | POA: Diagnosis not present

## 2019-05-16 ENCOUNTER — Telehealth: Payer: Self-pay | Admitting: Nurse Practitioner

## 2019-05-16 NOTE — Telephone Encounter (Signed)
I called [name], Mr. Skibicki daughter to schedule f/u PC visit, no answer, message left with contact information

## 2019-05-21 DIAGNOSIS — I130 Hypertensive heart and chronic kidney disease with heart failure and stage 1 through stage 4 chronic kidney disease, or unspecified chronic kidney disease: Secondary | ICD-10-CM | POA: Diagnosis not present

## 2019-05-21 DIAGNOSIS — I509 Heart failure, unspecified: Secondary | ICD-10-CM | POA: Diagnosis not present

## 2019-05-21 DIAGNOSIS — E785 Hyperlipidemia, unspecified: Secondary | ICD-10-CM | POA: Diagnosis not present

## 2019-05-21 DIAGNOSIS — K5900 Constipation, unspecified: Secondary | ICD-10-CM | POA: Diagnosis not present

## 2019-05-21 DIAGNOSIS — N183 Chronic kidney disease, stage 3 unspecified: Secondary | ICD-10-CM | POA: Diagnosis not present

## 2019-05-21 DIAGNOSIS — D631 Anemia in chronic kidney disease: Secondary | ICD-10-CM | POA: Diagnosis not present

## 2019-05-21 DIAGNOSIS — E1122 Type 2 diabetes mellitus with diabetic chronic kidney disease: Secondary | ICD-10-CM | POA: Diagnosis not present

## 2019-05-21 DIAGNOSIS — Z4789 Encounter for other orthopedic aftercare: Secondary | ICD-10-CM | POA: Diagnosis not present

## 2019-05-21 DIAGNOSIS — J449 Chronic obstructive pulmonary disease, unspecified: Secondary | ICD-10-CM | POA: Diagnosis not present

## 2019-05-22 DIAGNOSIS — I130 Hypertensive heart and chronic kidney disease with heart failure and stage 1 through stage 4 chronic kidney disease, or unspecified chronic kidney disease: Secondary | ICD-10-CM | POA: Diagnosis not present

## 2019-05-22 DIAGNOSIS — D631 Anemia in chronic kidney disease: Secondary | ICD-10-CM | POA: Diagnosis not present

## 2019-05-22 DIAGNOSIS — N183 Chronic kidney disease, stage 3 unspecified: Secondary | ICD-10-CM | POA: Diagnosis not present

## 2019-05-22 DIAGNOSIS — K5900 Constipation, unspecified: Secondary | ICD-10-CM | POA: Diagnosis not present

## 2019-05-22 DIAGNOSIS — E1122 Type 2 diabetes mellitus with diabetic chronic kidney disease: Secondary | ICD-10-CM | POA: Diagnosis not present

## 2019-05-22 DIAGNOSIS — I509 Heart failure, unspecified: Secondary | ICD-10-CM | POA: Diagnosis not present

## 2019-05-22 DIAGNOSIS — E785 Hyperlipidemia, unspecified: Secondary | ICD-10-CM | POA: Diagnosis not present

## 2019-05-22 DIAGNOSIS — J449 Chronic obstructive pulmonary disease, unspecified: Secondary | ICD-10-CM | POA: Diagnosis not present

## 2019-05-27 ENCOUNTER — Encounter: Payer: Self-pay | Admitting: Oncology

## 2019-05-27 NOTE — Progress Notes (Signed)
Spoke to patient's daughter and went over chart. No new concerns voiced.

## 2019-05-28 ENCOUNTER — Inpatient Hospital Stay: Payer: Medicare HMO

## 2019-05-28 ENCOUNTER — Inpatient Hospital Stay (HOSPITAL_BASED_OUTPATIENT_CLINIC_OR_DEPARTMENT_OTHER): Payer: Medicare HMO | Admitting: Oncology

## 2019-05-28 ENCOUNTER — Other Ambulatory Visit: Payer: Self-pay

## 2019-05-28 ENCOUNTER — Inpatient Hospital Stay: Payer: Medicare HMO | Attending: Oncology

## 2019-05-28 VITALS — BP 124/67 | HR 96 | Temp 98.6°F | Resp 18 | Wt 189.2 lb

## 2019-05-28 DIAGNOSIS — E785 Hyperlipidemia, unspecified: Secondary | ICD-10-CM | POA: Insufficient documentation

## 2019-05-28 DIAGNOSIS — N189 Chronic kidney disease, unspecified: Secondary | ICD-10-CM

## 2019-05-28 DIAGNOSIS — N183 Chronic kidney disease, stage 3 unspecified: Secondary | ICD-10-CM | POA: Diagnosis not present

## 2019-05-28 DIAGNOSIS — D631 Anemia in chronic kidney disease: Secondary | ICD-10-CM | POA: Diagnosis not present

## 2019-05-28 DIAGNOSIS — E1122 Type 2 diabetes mellitus with diabetic chronic kidney disease: Secondary | ICD-10-CM | POA: Insufficient documentation

## 2019-05-28 DIAGNOSIS — J439 Emphysema, unspecified: Secondary | ICD-10-CM | POA: Diagnosis not present

## 2019-05-28 DIAGNOSIS — M549 Dorsalgia, unspecified: Secondary | ICD-10-CM | POA: Diagnosis not present

## 2019-05-28 DIAGNOSIS — Z87891 Personal history of nicotine dependence: Secondary | ICD-10-CM | POA: Insufficient documentation

## 2019-05-28 DIAGNOSIS — G8929 Other chronic pain: Secondary | ICD-10-CM | POA: Insufficient documentation

## 2019-05-28 DIAGNOSIS — D509 Iron deficiency anemia, unspecified: Secondary | ICD-10-CM | POA: Diagnosis not present

## 2019-05-28 DIAGNOSIS — I129 Hypertensive chronic kidney disease with stage 1 through stage 4 chronic kidney disease, or unspecified chronic kidney disease: Secondary | ICD-10-CM | POA: Insufficient documentation

## 2019-05-28 DIAGNOSIS — J441 Chronic obstructive pulmonary disease with (acute) exacerbation: Secondary | ICD-10-CM | POA: Diagnosis not present

## 2019-05-28 DIAGNOSIS — E119 Type 2 diabetes mellitus without complications: Secondary | ICD-10-CM | POA: Insufficient documentation

## 2019-05-28 DIAGNOSIS — J9610 Chronic respiratory failure, unspecified whether with hypoxia or hypercapnia: Secondary | ICD-10-CM | POA: Insufficient documentation

## 2019-05-28 DIAGNOSIS — Z79899 Other long term (current) drug therapy: Secondary | ICD-10-CM | POA: Diagnosis not present

## 2019-05-28 DIAGNOSIS — Z794 Long term (current) use of insulin: Secondary | ICD-10-CM | POA: Insufficient documentation

## 2019-05-28 LAB — CBC WITH DIFFERENTIAL/PLATELET
Abs Immature Granulocytes: 0.05 10*3/uL (ref 0.00–0.07)
Basophils Absolute: 0 10*3/uL (ref 0.0–0.1)
Basophils Relative: 0 %
Eosinophils Absolute: 0.1 10*3/uL (ref 0.0–0.5)
Eosinophils Relative: 1 %
HCT: 25.5 % — ABNORMAL LOW (ref 39.0–52.0)
Hemoglobin: 8.2 g/dL — ABNORMAL LOW (ref 13.0–17.0)
Immature Granulocytes: 0 %
Lymphocytes Relative: 10 %
Lymphs Abs: 1.5 10*3/uL (ref 0.7–4.0)
MCH: 29.7 pg (ref 26.0–34.0)
MCHC: 32.2 g/dL (ref 30.0–36.0)
MCV: 92.4 fL (ref 80.0–100.0)
Monocytes Absolute: 0.9 10*3/uL (ref 0.1–1.0)
Monocytes Relative: 6 %
Neutro Abs: 11.9 10*3/uL — ABNORMAL HIGH (ref 1.7–7.7)
Neutrophils Relative %: 83 %
Platelets: 244 10*3/uL (ref 150–400)
RBC: 2.76 MIL/uL — ABNORMAL LOW (ref 4.22–5.81)
RDW: 18.7 % — ABNORMAL HIGH (ref 11.5–15.5)
WBC: 14.4 10*3/uL — ABNORMAL HIGH (ref 4.0–10.5)
nRBC: 0.1 % (ref 0.0–0.2)

## 2019-05-28 LAB — IRON AND TIBC
Iron: 18 ug/dL — ABNORMAL LOW (ref 45–182)
Saturation Ratios: 11 % — ABNORMAL LOW (ref 17.9–39.5)
TIBC: 158 ug/dL — ABNORMAL LOW (ref 250–450)
UIBC: 140 ug/dL

## 2019-05-28 LAB — FERRITIN: Ferritin: 705 ng/mL — ABNORMAL HIGH (ref 24–336)

## 2019-05-28 MED ORDER — EPOETIN ALFA-EPBX 40000 UNIT/ML IJ SOLN: 60000.0000 [IU] | Freq: Once | INTRAMUSCULAR | Status: DC

## 2019-05-28 MED ORDER — EPOETIN ALFA-EPBX 40000 UNIT/ML IJ SOLN
40000.0000 [IU] | Freq: Once | INTRAMUSCULAR | Status: AC
  Administered 2019-05-28: 40000 [IU] via SUBCUTANEOUS
  Filled 2019-05-28: qty 1

## 2019-05-28 MED ORDER — EPOETIN ALFA-EPBX 10000 UNIT/ML IJ SOLN
20000.0000 [IU] | Freq: Once | INTRAMUSCULAR | Status: AC
  Administered 2019-05-28: 20000 [IU] via SUBCUTANEOUS
  Filled 2019-05-28: qty 2

## 2019-05-28 NOTE — Progress Notes (Signed)
Hematology/Oncology  [facility] Telephone:([phone] [phone]   Patient Care Team: Baxter Hire, MD as PCP - General (Internal Medicine) Earlie Server, MD as Consulting Physician (Oncology)  REFERRING PROVIDER: Baxter Hire, MD  CHIEF COMPLAINTS/REASON FOR VISIT:  Follow-up for anemia secondary to chronic kidney disease.  HISTORY OF PRESENTING ILLNESS:   Arthur [name] is a  86 y.o.  male with PMH listed below was seen in consultation at the request of  Baxter Hire, MD  for evaluation of anemia Patient has chronic comorbidities including diabetes, COPD/emphysema, chronic respiratory failure on home oxygen, CKD stage III, CHF, hypertension and hyperlipidemia. Reviewed patient's previous lab records. Extensive medical records review was performed by me. Anemia has been chronic, dated back to at least 2016.  At that time his hemoglobin was around 11-12. Hemoglobin started to trended down below 10 starting February 2020. 02/11/2018 hemoglobin 9, hematocrit 28.6, 06/17/2018 hemoglobin 7.3 hematocrit of 24, 06/27/2018 hemoglobin 5.8 07/15/2018 hemoglobin 7.5 07/29/2018 hemoglobin 8.2, 08/12/2018, hemoglobin 9.2   INTERVAL HISTORY Arthur [name] is a 86 y.o. male who has above history reviewed by me today presents for follow up visit for management of anemia Problems and complaints are listed below: Patient was accompanied by her daughter. Patient is a poor historian.  He continues to have chronic back pain. Patient has resumed on oral iron supplementation.  Tolerates well.  Review of Systems  Constitutional: Positive for fatigue. Negative for appetite change, chills, fever and unexpected weight change.  HENT:   Negative for hearing loss and voice change.   Eyes: Negative for eye problems and icterus.  Respiratory: Negative for chest tightness, cough and shortness of breath.   Cardiovascular: Negative for chest pain and leg swelling.    Gastrointestinal: Negative for abdominal distention and abdominal pain.  Endocrine: Negative for hot flashes.  Genitourinary: Negative for difficulty urinating, dysuria and frequency.   Musculoskeletal: Negative for arthralgias.       Chronic back pain  Skin: Negative for itching and rash.  Neurological: Negative for light-headedness and numbness.  Hematological: Negative for adenopathy. Does not bruise/bleed easily.  Psychiatric/Behavioral: Negative for confusion.    MEDICAL HISTORY:  Past Medical History:  Diagnosis Date  . Anemia of chronic kidney failure 08/21/2018  . Arthritis    lower back  . CHF (congestive heart failure) ([place])   . CKD (chronic kidney disease), stage III   . COPD (chronic obstructive pulmonary disease) ([place])   . Diabetes mellitus without complication ([place])    type 2  . Dyspnea    when active -wears oxygen 2L via [place]  . History of blood transfusion   . Hyperlipidemia   . Hypertension   . Requires continuous at home supplemental oxygen    2L via [place] [facility] [place]    SURGICAL HISTORY: Past Surgical History:  Procedure Laterality Date  . COLONOSCOPY WITH PROPOFOL N/A 06/11/2018   Procedure: COLONOSCOPY WITH PROPOFOL;  Surgeon: Jonathon Bellows, MD;  Location: [facility] ENDOSCOPY;  Service: Gastroenterology;  Laterality: N/A;  . COLONOSCOPY WITH PROPOFOL N/A 06/13/2018   Procedure: COLONOSCOPY WITH PROPOFOL;  Surgeon: Jonathon Bellows, MD;  Location: [facility] ENDOSCOPY;  Service: Gastroenterology;  Laterality: N/A;  . ESOPHAGOGASTRODUODENOSCOPY Left 06/10/2018   Procedure: ESOPHAGOGASTRODUODENOSCOPY (EGD);  Surgeon: Jonathon Bellows, MD;  Location: [facility] ENDOSCOPY;  Service: Gastroenterology;  Laterality: Left;  . EYE SURGERY     removed cataracts  . GIVENS CAPSULE STUDY N/A 06/28/2018   Procedure: GIVENS CAPSULE STUDY;  Surgeon: Lucilla Lame, MD;  Location: [facility] ENDOSCOPY;  Service: Endoscopy;  Laterality: N/A;  . GIVENS CAPSULE STUDY N/A 06/30/2018   Procedure: GIVENS CAPSULE STUDY;  Surgeon:  Jonathon Bellows, MD;  Location: [facility] ENDOSCOPY;  Service: Gastroenterology;  Laterality: N/A;  . KYPHOPLASTY N/A 10/31/2018   Procedure: KYPHOPLASTY L2;  Surgeon: [name], MD;  Location: [facility] ORS;  Service: Orthopedics;  Laterality: N/A;  . KYPHOPLASTY N/A 12/24/2018   Procedure: KYPHOPLASTY L3;  Surgeon: [name], MD;  Location: [facility] ORS;  Service: Orthopedics;  Laterality: N/A;  . KYPHOPLASTY N/A 02/18/2019   Procedure: L2 KYPHOPLASTY;  Surgeon: [name], MD;  Location: [facility] ORS;  Service: Orthopedics;  Laterality: N/A;  . RADIOLOGY WITH ANESTHESIA N/A 01/28/2019   Procedure: MRI LUMBER SPINE WITHOUT CONTRAST;  Surgeon: Radiologist, Medication, MD;  Location: [place];  Service: Radiology;  Laterality: N/A;    SOCIAL HISTORY: Social History   Socioeconomic History  . Marital status: Widowed    Spouse name: Not on file  . Number of children: Not on file  . Years of education: Not on file  . Highest education level: Not on file  Occupational History  . Not on file  Tobacco Use  . Smoking status: Former Smoker    Packs/day: 2.00    Years: 35.00    Pack years: 70.00    Types: Cigarettes    Quit date: 11/04/1982    Years since quitting: 36.5  . Smokeless tobacco: Current User    Types: Chew  Substance and Sexual Activity  . Alcohol use: Not Currently  . Drug use: No  . Sexual activity: Not Currently  Other Topics Concern  . Not on file  Social History Narrative  . Not on file   Social Determinants of Health   Financial Resource Strain: Low Risk   . Difficulty of Paying Living Expenses: Not hard at all  Food Insecurity: No Food Insecurity  . Worried About Charity fundraiser in the Last Year: Never true  . Ran Out of Food in the Last Year: Never true  Transportation Needs: No Transportation Needs  . Lack of Transportation (Medical): No  . Lack of Transportation (Non-Medical): No  Physical Activity: Unknown  . Days of Exercise per Week: 0 days  . Minutes of  Exercise per Session: Not on file  Stress: No Stress Concern Present  . Feeling of Stress : Not at all  Social Connections:   . Frequency of Communication with Friends and Family:   . Frequency of Social Gatherings with Friends and Family:   . Attends Religious Services:   . Active Member of Clubs or Organizations:   . Attends Archivist Meetings:   Marland Kitchen Marital Status:   Intimate Partner Violence: Unknown  . Fear of Current or Ex-Partner: Patient refused  . Emotionally Abused: Patient refused  . Physically Abused: Patient refused  . Sexually Abused: Patient refused    FAMILY HISTORY: Family History  Problem Relation Age of Onset  . COPD Mother   . Cancer Father     ALLERGIES:  is allergic to codeine.  MEDICATIONS:  Current Outpatient Medications  Medication Sig Dispense Refill  . acetaminophen (TYLENOL) 325 MG tablet Take 2 tablets (650 mg total) by mouth every 6 (six) hours as needed for mild pain (or Fever >/= 101).    Marland Kitchen albuterol (VENTOLIN HFA) 108 (90 Base) MCG/ACT inhaler Inhale 2 puffs into the lungs every 6 (six) hours as needed for wheezing or shortness of breath. 1 Inhaler 2  .  amLODipine (NORVASC) 2.5 MG tablet Take 2.5 mg by mouth daily.    Marland Kitchen ascorbic acid (VITAMIN C) 500 MG tablet Take 500 mg by mouth at bedtime.    Marland Kitchen FERREX 150 150 MG capsule     . insulin aspart (NOVOLOG) 100 UNIT/ML injection 4 units prior to meals if sugars above 200 10 mL 11  . LEVEMIR 100 UNIT/ML injection Inject 0.04 mLs (4 Units total) into the skin at bedtime. 10 mL 0  . metoCLOPramide (REGLAN) 5 MG tablet Take 5 mg by mouth 4 (four) times daily -  before meals and at bedtime.    . pantoprazole (PROTONIX) 40 MG tablet Take 1 tablet (40 mg total) by mouth daily.    . potassium chloride (KLOR-CON) 10 MEQ tablet Take 10 mEq by mouth every evening.    . pravastatin (PRAVACHOL) 10 MG tablet Take 10 mg by mouth at bedtime.    . tamsulosin (FLOMAX) 0.4 MG CAPS capsule Take 0.4 mg by mouth  daily.    Marland Kitchen tiotropium (SPIRIVA) 18 MCG inhalation capsule Place 18 mcg into inhaler and inhale daily.    Marland Kitchen torsemide (DEMADEX) 20 MG tablet Take 20 mg by mouth every Monday, Wednesday, and Friday.    . docusate sodium (COLACE) 100 MG capsule Take 1 capsule (100 mg total) by mouth 2 (two) times daily. (Patient not taking: Reported on 05/27/2019) 60 capsule 0  . HYDROcodone-acetaminophen (NORCO) 5-325 MG tablet Take 1 tablet by mouth every 6 (six) hours as needed for moderate pain or severe pain. (Patient not taking: Reported on 04/30/2019) 9 tablet 0  . polyethylene glycol powder (GLYCOLAX/MIRALAX) 17 GM/SCOOP powder Take 17 g by mouth every other day.     . promethazine (PHENERGAN) 25 MG tablet Take 25 mg by mouth every 4 (four) hours as needed for nausea or vomiting.     No current facility-administered medications for this visit.     PHYSICAL EXAMINATION: ECOG PERFORMANCE STATUS: 3 - Symptomatic, >50% confined to bed Vitals:   05/28/19 1016  BP: 124/67  Pulse: 96  Resp: 18  Temp: 98.6 F (37 C)   Filed Weights   05/28/19 1016  Weight: 189 lb 3.2 oz (85.8 kg)    Physical Exam Constitutional:      General: He is not in acute distress.    Appearance: He is ill-appearing.     Comments: Sits in wheel chair  HENT:     Head: Normocephalic and atraumatic.  Eyes:     General: No scleral icterus.    Pupils: Pupils are equal, round, and reactive to light.  Cardiovascular:     Rate and Rhythm: Normal rate and regular rhythm.     Heart sounds: Normal heart sounds.  Pulmonary:     Effort: Pulmonary effort is normal. No respiratory distress.     Breath sounds: Normal breath sounds. No wheezing.     Comments: On nasal cannula oxygen. Decreased breath sounds at the bases. Abdominal:     General: Bowel sounds are normal. There is no distension.     Palpations: Abdomen is soft. There is no mass.     Tenderness: There is no abdominal tenderness.  Musculoskeletal:        General: No  deformity. Normal range of motion.     Cervical back: Normal range of motion and neck supple.     Comments: Chronic bilateral lower extremity edema.   Skin:    General: Skin is warm and dry.     Coloration:  Skin is not pale.     Findings: No erythema or rash.  Neurological:     Mental Status: He is alert and oriented to person, place, and time. Mental status is at baseline.     Cranial Nerves: No cranial nerve deficit.     Coordination: Coordination normal.  Psychiatric:        Mood and Affect: Mood normal.     LABORATORY DATA:  I have reviewed the data as listed Lab Results  Component Value Date   WBC 14.4 (H) 05/28/2019   HGB 8.2 (L) 05/28/2019   HCT 25.5 (L) 05/28/2019   MCV 92.4 05/28/2019   PLT 244 05/28/2019   Recent Labs    01/23/19 0826 01/28/19 0828 02/21/19 1847 02/21/19 1847 03/04/19 1220 03/05/19 0820 03/07/19 0722 03/08/19 0959 03/10/19 0538  NA  --    < > 135   < > 133*   < > 132* 131* 130*  K  --    < > 4.2   < > 3.3*   < > 3.8 3.7 3.9  CL  --    < > 94*   < > 95*   < > 95* 91* 93*  CO2  --    < > 29   < > 28   < > 29 30 32  GLUCOSE  --    < > 120*   < > 161*   < > 151* 195* 116*  BUN  --    < > 30*   < > 18   < > 34* 36* 27*  CREATININE  --    < > 1.74*   < > 1.54*   < > 1.94* 1.73* 1.59*  CALCIUM  --    < > 8.6*   < > 8.3*   < > 8.1* 8.1* 8.3*  GFRNONAA  --    < > 35*   < > 41*   < > 31* 36* 40*  GFRAA  --    < > 41*   < > 48*   < > 36* 41* 46*  PROT 7.8  --  7.2  --  6.9  --   --   --   --   ALBUMIN 3.2*  --  3.2*  --  3.3*  --   --   --   --   AST 13*  --  11*  --  9*  --   --   --   --   ALT 12  --  6  --  8  --   --   --   --   ALKPHOS 77  --  67  --  73  --   --   --   --   BILITOT 0.6  --  0.6  --  0.8  --   --   --   --   BILIDIR <0.1  --   --   --   --   --   --   --   --   IBILI NOT CALCULATED  --   --   --   --   --   --   --   --    < > = values in this interval not displayed.   Iron/TIBC/Ferritin/ %Sat    Component Value  Date/Time   IRON 18 (L) 05/28/2019 0955   IRON 36 (L) 07/15/2018 1544   TIBC 158 (L) 05/28/2019 0955   TIBC 172 (  L) 07/15/2018 1544   FERRITIN 705 (H) 05/28/2019 0955   FERRITIN 535 (H) 07/15/2018 1544   IRONPCTSAT 11 (L) 05/28/2019 0955   IRONPCTSAT 21 07/15/2018 1544      RADIOGRAPHIC STUDIES: I have personally reviewed the radiological images as listed and agreed with the findings in the report.  No results found.    ASSESSMENT & PLAN:  1. Anemia of chronic renal failure, unspecified CKD stage   2. Iron deficiency anemia, unspecified iron deficiency anemia type    #Anemia of chronic kidney disease, underlying MDS cannot be ruled out.  Patient declines bone marrow biopsy due to multiple other medical problems. Has been on monthly Retacrit 60,000 units  Hemoglobin has not dramatically improved since last visit.  Today's hemoglobin is 8.2. Iron panel shows ferritin of 705, iron saturation 11, TIBC 158. Previously reticulocyte hemoglobin is reduced at 26.7. I had a lengthy discussion with patient and her daughter. Despite ferritin being chronically elevated, patient has decreased iron saturation and decreased reticulocyte hemoglobin, both are consistent with iron deficiency. Erythropoietin therapy would not be fully effective until iron store being fully replete.  Patient has been on oral iron supplementation with no significant improvement.  I suspect that he may have chronic blood loss.  I recommend treatments.  IV Venofer Plan IV iron with Venofer 289m weekly x 2 doses. Allergy reactions/infusion reaction including anaphylactic reaction discussed with patient. Other side effects include but not limited to high blood pressure, skin rash, weight gain, leg swelling, etc. Patient and daughter voice understanding and willing to proceed. Proceed with Retacrit 60,000 units today. Continue follow-up every 4 weeks for Retacrit injections.  All questions were answered. The patient knows  to call the clinic with any problems questions or concerns.   ZEarlie Server MD, PhD Hematology Oncology CSt. Peter'S Hospitalat AC[facility] Surgery Center LLCPager- [phone]/26/2021

## 2019-05-29 ENCOUNTER — Telehealth: Payer: Self-pay

## 2019-05-29 DIAGNOSIS — Z4789 Encounter for other orthopedic aftercare: Secondary | ICD-10-CM | POA: Diagnosis not present

## 2019-05-29 DIAGNOSIS — I130 Hypertensive heart and chronic kidney disease with heart failure and stage 1 through stage 4 chronic kidney disease, or unspecified chronic kidney disease: Secondary | ICD-10-CM | POA: Diagnosis not present

## 2019-05-29 DIAGNOSIS — N183 Chronic kidney disease, stage 3 unspecified: Secondary | ICD-10-CM | POA: Diagnosis not present

## 2019-05-29 DIAGNOSIS — D631 Anemia in chronic kidney disease: Secondary | ICD-10-CM | POA: Diagnosis not present

## 2019-05-29 DIAGNOSIS — E1122 Type 2 diabetes mellitus with diabetic chronic kidney disease: Secondary | ICD-10-CM | POA: Diagnosis not present

## 2019-05-29 DIAGNOSIS — I509 Heart failure, unspecified: Secondary | ICD-10-CM | POA: Diagnosis not present

## 2019-05-29 DIAGNOSIS — E785 Hyperlipidemia, unspecified: Secondary | ICD-10-CM | POA: Diagnosis not present

## 2019-05-29 DIAGNOSIS — K5900 Constipation, unspecified: Secondary | ICD-10-CM | POA: Diagnosis not present

## 2019-05-29 DIAGNOSIS — J449 Chronic obstructive pulmonary disease, unspecified: Secondary | ICD-10-CM | POA: Diagnosis not present

## 2019-05-29 NOTE — Telephone Encounter (Signed)
-----   Message from Earlie Server, MD sent at 05/28/2019  5:09 PM EDT ----- Please arrange patient to have IV Venofer treatments weekly x2.  Daughter knows the plan. Follow-up as planned.

## 2019-05-29 NOTE — Telephone Encounter (Signed)
Done...  IV Venofer weekly x2 appts has been scheduled as requested. Per pts daughter [name] request to have appts scheduled on Friday's 06/13/19 and 06/20/19.

## 2019-05-29 NOTE — Telephone Encounter (Signed)
Please schedule and inform patient's daughter of appt details.  Thanks

## 2019-06-03 DIAGNOSIS — J441 Chronic obstructive pulmonary disease with (acute) exacerbation: Secondary | ICD-10-CM | POA: Diagnosis not present

## 2019-06-05 ENCOUNTER — Ambulatory Visit: Payer: Medicare HMO

## 2019-06-12 ENCOUNTER — Ambulatory Visit: Payer: Medicare HMO

## 2019-06-13 ENCOUNTER — Other Ambulatory Visit: Payer: Self-pay

## 2019-06-13 ENCOUNTER — Inpatient Hospital Stay: Payer: Medicare HMO | Attending: Oncology

## 2019-06-13 VITALS — BP 127/47 | HR 68 | Temp 98.8°F | Resp 18

## 2019-06-13 DIAGNOSIS — J439 Emphysema, unspecified: Secondary | ICD-10-CM | POA: Insufficient documentation

## 2019-06-13 DIAGNOSIS — Z87891 Personal history of nicotine dependence: Secondary | ICD-10-CM | POA: Diagnosis not present

## 2019-06-13 DIAGNOSIS — E119 Type 2 diabetes mellitus without complications: Secondary | ICD-10-CM | POA: Insufficient documentation

## 2019-06-13 DIAGNOSIS — N183 Chronic kidney disease, stage 3 unspecified: Secondary | ICD-10-CM | POA: Diagnosis not present

## 2019-06-13 DIAGNOSIS — Z794 Long term (current) use of insulin: Secondary | ICD-10-CM | POA: Diagnosis not present

## 2019-06-13 DIAGNOSIS — I129 Hypertensive chronic kidney disease with stage 1 through stage 4 chronic kidney disease, or unspecified chronic kidney disease: Secondary | ICD-10-CM | POA: Insufficient documentation

## 2019-06-13 DIAGNOSIS — Z79899 Other long term (current) drug therapy: Secondary | ICD-10-CM | POA: Diagnosis not present

## 2019-06-13 DIAGNOSIS — E785 Hyperlipidemia, unspecified: Secondary | ICD-10-CM | POA: Diagnosis not present

## 2019-06-13 DIAGNOSIS — D631 Anemia in chronic kidney disease: Secondary | ICD-10-CM | POA: Diagnosis not present

## 2019-06-13 DIAGNOSIS — D509 Iron deficiency anemia, unspecified: Secondary | ICD-10-CM | POA: Insufficient documentation

## 2019-06-13 DIAGNOSIS — I509 Heart failure, unspecified: Secondary | ICD-10-CM | POA: Diagnosis not present

## 2019-06-13 MED ORDER — IRON SUCROSE 20 MG/ML IV SOLN
200.0000 mg | Freq: Once | INTRAVENOUS | Status: AC
  Administered 2019-06-13: 200 mg via INTRAVENOUS
  Filled 2019-06-13: qty 10

## 2019-06-13 MED ORDER — SODIUM CHLORIDE 0.9 % IV SOLN: 200.0000 mg | Freq: Once | INTRAVENOUS | Status: DC

## 2019-06-13 MED ORDER — SODIUM CHLORIDE 0.9 % IV SOLN
Freq: Once | INTRAVENOUS | Status: AC
  Filled 2019-06-13: qty 250

## 2019-06-20 ENCOUNTER — Inpatient Hospital Stay: Payer: Medicare HMO

## 2019-06-20 ENCOUNTER — Other Ambulatory Visit: Payer: Self-pay

## 2019-06-20 VITALS — BP 145/56 | HR 68 | Temp 98.2°F | Resp 18

## 2019-06-20 DIAGNOSIS — I129 Hypertensive chronic kidney disease with stage 1 through stage 4 chronic kidney disease, or unspecified chronic kidney disease: Secondary | ICD-10-CM | POA: Diagnosis not present

## 2019-06-20 DIAGNOSIS — E785 Hyperlipidemia, unspecified: Secondary | ICD-10-CM | POA: Diagnosis not present

## 2019-06-20 DIAGNOSIS — D509 Iron deficiency anemia, unspecified: Secondary | ICD-10-CM | POA: Diagnosis not present

## 2019-06-20 DIAGNOSIS — D631 Anemia in chronic kidney disease: Secondary | ICD-10-CM | POA: Diagnosis not present

## 2019-06-20 DIAGNOSIS — N183 Chronic kidney disease, stage 3 unspecified: Secondary | ICD-10-CM | POA: Diagnosis not present

## 2019-06-20 DIAGNOSIS — N189 Chronic kidney disease, unspecified: Secondary | ICD-10-CM

## 2019-06-20 DIAGNOSIS — Z79899 Other long term (current) drug therapy: Secondary | ICD-10-CM | POA: Diagnosis not present

## 2019-06-20 DIAGNOSIS — J439 Emphysema, unspecified: Secondary | ICD-10-CM | POA: Diagnosis not present

## 2019-06-20 DIAGNOSIS — I509 Heart failure, unspecified: Secondary | ICD-10-CM | POA: Diagnosis not present

## 2019-06-20 DIAGNOSIS — E119 Type 2 diabetes mellitus without complications: Secondary | ICD-10-CM | POA: Diagnosis not present

## 2019-06-20 MED ORDER — SODIUM CHLORIDE 0.9 % IV SOLN: 200.0000 mg | Freq: Once | INTRAVENOUS | Status: DC

## 2019-06-20 MED ORDER — IRON SUCROSE 20 MG/ML IV SOLN
200.0000 mg | Freq: Once | INTRAVENOUS | Status: AC
  Administered 2019-06-20: 200 mg via INTRAVENOUS
  Filled 2019-06-20: qty 10

## 2019-06-20 MED ORDER — SODIUM CHLORIDE 0.9 % IV SOLN
Freq: Once | INTRAVENOUS | Status: AC
  Filled 2019-06-20: qty 250

## 2019-06-26 ENCOUNTER — Inpatient Hospital Stay: Payer: Medicare HMO | Admitting: Oncology

## 2019-06-26 ENCOUNTER — Inpatient Hospital Stay: Payer: Medicare HMO

## 2019-06-27 ENCOUNTER — Encounter: Payer: Self-pay | Admitting: Oncology

## 2019-06-27 ENCOUNTER — Other Ambulatory Visit: Payer: Self-pay

## 2019-06-27 ENCOUNTER — Inpatient Hospital Stay: Payer: Medicare HMO

## 2019-06-27 ENCOUNTER — Inpatient Hospital Stay (HOSPITAL_BASED_OUTPATIENT_CLINIC_OR_DEPARTMENT_OTHER): Payer: Medicare HMO | Admitting: Oncology

## 2019-06-27 VITALS — BP 126/51 | HR 76 | Temp 98.0°F | Resp 20 | Wt 188.8 lb

## 2019-06-27 DIAGNOSIS — Z79899 Other long term (current) drug therapy: Secondary | ICD-10-CM | POA: Diagnosis not present

## 2019-06-27 DIAGNOSIS — I509 Heart failure, unspecified: Secondary | ICD-10-CM | POA: Diagnosis not present

## 2019-06-27 DIAGNOSIS — D631 Anemia in chronic kidney disease: Secondary | ICD-10-CM | POA: Diagnosis not present

## 2019-06-27 DIAGNOSIS — N1832 Chronic kidney disease, stage 3b: Secondary | ICD-10-CM | POA: Diagnosis not present

## 2019-06-27 DIAGNOSIS — E785 Hyperlipidemia, unspecified: Secondary | ICD-10-CM | POA: Diagnosis not present

## 2019-06-27 DIAGNOSIS — D509 Iron deficiency anemia, unspecified: Secondary | ICD-10-CM

## 2019-06-27 DIAGNOSIS — N183 Chronic kidney disease, stage 3 unspecified: Secondary | ICD-10-CM | POA: Diagnosis not present

## 2019-06-27 DIAGNOSIS — I129 Hypertensive chronic kidney disease with stage 1 through stage 4 chronic kidney disease, or unspecified chronic kidney disease: Secondary | ICD-10-CM | POA: Diagnosis not present

## 2019-06-27 DIAGNOSIS — N189 Chronic kidney disease, unspecified: Secondary | ICD-10-CM

## 2019-06-27 DIAGNOSIS — R7989 Other specified abnormal findings of blood chemistry: Secondary | ICD-10-CM | POA: Insufficient documentation

## 2019-06-27 DIAGNOSIS — J439 Emphysema, unspecified: Secondary | ICD-10-CM | POA: Diagnosis not present

## 2019-06-27 DIAGNOSIS — E119 Type 2 diabetes mellitus without complications: Secondary | ICD-10-CM | POA: Diagnosis not present

## 2019-06-27 LAB — CBC WITH DIFFERENTIAL/PLATELET
Abs Immature Granulocytes: 0.03 10*3/uL (ref 0.00–0.07)
Basophils Absolute: 0.1 10*3/uL (ref 0.0–0.1)
Basophils Relative: 1 %
Eosinophils Absolute: 0.3 10*3/uL (ref 0.0–0.5)
Eosinophils Relative: 5 %
HCT: 22.9 % — ABNORMAL LOW (ref 39.0–52.0)
Hemoglobin: 7.2 g/dL — ABNORMAL LOW (ref 13.0–17.0)
Immature Granulocytes: 1 %
Lymphocytes Relative: 23 %
Lymphs Abs: 1.4 10*3/uL (ref 0.7–4.0)
MCH: 29.3 pg (ref 26.0–34.0)
MCHC: 31.4 g/dL (ref 30.0–36.0)
MCV: 93.1 fL (ref 80.0–100.0)
Monocytes Absolute: 0.5 10*3/uL (ref 0.1–1.0)
Monocytes Relative: 9 %
Neutro Abs: 3.9 10*3/uL (ref 1.7–7.7)
Neutrophils Relative %: 61 %
Platelets: 277 10*3/uL (ref 150–400)
RBC: 2.46 MIL/uL — ABNORMAL LOW (ref 4.22–5.81)
RDW: 19.6 % — ABNORMAL HIGH (ref 11.5–15.5)
WBC: 6.2 10*3/uL (ref 4.0–10.5)
nRBC: 0 % (ref 0.0–0.2)

## 2019-06-27 LAB — RETIC PANEL
Immature Retic Fract: 12.3 % (ref 2.3–15.9)
RBC.: 2.46 MIL/uL — ABNORMAL LOW (ref 4.22–5.81)
Retic Count, Absolute: 19.7 10*3/uL (ref 19.0–186.0)
Retic Ct Pct: 0.8 % (ref 0.4–3.1)
Reticulocyte Hemoglobin: 27.3 pg — ABNORMAL LOW (ref >27.9)

## 2019-06-27 MED ORDER — EPOETIN ALFA-EPBX 40000 UNIT/ML IJ SOLN: 60000.0000 [IU] | Freq: Once | INTRAMUSCULAR | Status: DC

## 2019-06-27 MED ORDER — EPOETIN ALFA-EPBX 10000 UNIT/ML IJ SOLN
20000.0000 [IU] | Freq: Once | INTRAMUSCULAR | Status: AC
  Administered 2019-06-27: 20000 [IU] via SUBCUTANEOUS
  Filled 2019-06-27: qty 2

## 2019-06-27 MED ORDER — EPOETIN ALFA-EPBX 40000 UNIT/ML IJ SOLN
40000.0000 [IU] | Freq: Once | INTRAMUSCULAR | Status: AC
  Administered 2019-06-27: 40000 [IU] via SUBCUTANEOUS
  Filled 2019-06-27: qty 1

## 2019-06-27 NOTE — Progress Notes (Signed)
Patient states that he has no concerns or problems at the moment.

## 2019-06-27 NOTE — Progress Notes (Signed)
Hematology/Oncology  [facility] Telephone:([phone] [phone]   Patient Care Team: Baxter Hire, MD as PCP - General (Internal Medicine) Earlie Server, MD as Consulting Physician (Oncology)  REFERRING PROVIDER: Baxter Hire, MD  CHIEF COMPLAINTS/REASON FOR VISIT:  Follow-up for anemia secondary to chronic kidney disease.  HISTORY OF PRESENTING ILLNESS:   Arthur [name] is a  86 y.o.  male with PMH listed below was seen in consultation at the request of  Baxter Hire, MD  for evaluation of anemia Patient has chronic comorbidities including diabetes, COPD/emphysema, chronic respiratory failure on home oxygen, CKD stage III, CHF, hypertension and hyperlipidemia. Reviewed patient's previous lab records. Extensive medical records review was performed by me. Anemia has been chronic, dated back to at least 2016.  At that time his hemoglobin was around 11-12. Hemoglobin started to trended down below 10 starting February 2020. 02/11/2018 hemoglobin 9, hematocrit 28.6, 06/17/2018 hemoglobin 7.3 hematocrit of 24, 06/27/2018 hemoglobin 5.8 07/15/2018 hemoglobin 7.5 07/29/2018 hemoglobin 8.2, 08/12/2018, hemoglobin 9.2   INTERVAL HISTORY Arthur [name] is a 86 y.o. male who has above history reviewed by me today presents for follow up visit for management of anemia Problems and complaints are listed below: Patient was accompanied by her daughter.   Patient is a poor historian.  He reports feeling "fine".  Stool is dark but patient is on oral iron supplementation.  Review of Systems  Constitutional: Positive for fatigue. Negative for appetite change, chills, fever and unexpected weight change.  HENT:   Negative for hearing loss and voice change.   Eyes: Negative for eye problems and icterus.  Respiratory: Negative for chest tightness, cough and shortness of breath.   Cardiovascular: Negative for chest pain and leg swelling.  Gastrointestinal: Negative  for abdominal distention and abdominal pain.  Endocrine: Negative for hot flashes.  Genitourinary: Negative for difficulty urinating, dysuria and frequency.   Musculoskeletal: Negative for arthralgias.       Chronic back pain  Skin: Negative for itching and rash.  Neurological: Negative for light-headedness and numbness.  Hematological: Negative for adenopathy. Does not bruise/bleed easily.  Psychiatric/Behavioral: Negative for confusion.    MEDICAL HISTORY:  Past Medical History:  Diagnosis Date  . Anemia of chronic kidney failure 08/21/2018  . Arthritis    lower back  . CHF (congestive heart failure) ([place])   . CKD (chronic kidney disease), stage III   . COPD (chronic obstructive pulmonary disease) ([place])   . Diabetes mellitus without complication ([place])    type 2  . Dyspnea    when active -wears oxygen 2L via [place]  . History of blood transfusion   . Hyperlipidemia   . Hypertension   . Requires continuous at home supplemental oxygen    2L via [place]    SURGICAL HISTORY: Past Surgical History:  Procedure Laterality Date  . COLONOSCOPY WITH PROPOFOL N/A 06/11/2018   Procedure: COLONOSCOPY WITH PROPOFOL;  Surgeon: Jonathon Bellows, MD;  Location: [facility] ENDOSCOPY;  Service: Gastroenterology;  Laterality: N/A;  . COLONOSCOPY WITH PROPOFOL N/A 06/13/2018   Procedure: COLONOSCOPY WITH PROPOFOL;  Surgeon: Jonathon Bellows, MD;  Location: [facility] ENDOSCOPY;  Service: Gastroenterology;  Laterality: N/A;  . ESOPHAGOGASTRODUODENOSCOPY Left 06/10/2018   Procedure: ESOPHAGOGASTRODUODENOSCOPY (EGD);  Surgeon: Jonathon Bellows, MD;  Location: [facility] ENDOSCOPY;  Service: Gastroenterology;  Laterality: Left;  . EYE SURGERY     removed cataracts  . GIVENS CAPSULE STUDY N/A 06/28/2018   Procedure: GIVENS CAPSULE STUDY;  Surgeon: Lucilla Lame, MD;  Location:  [place] ENDOSCOPY;  Service: Endoscopy;  Laterality: N/A;  . GIVENS CAPSULE STUDY N/A 06/30/2018   Procedure: GIVENS CAPSULE STUDY;  Surgeon: Jonathon Bellows, MD;  Location: [facility]  ENDOSCOPY;  Service: Gastroenterology;  Laterality: N/A;  . KYPHOPLASTY N/A 10/31/2018   Procedure: KYPHOPLASTY L2;  Surgeon: [name], MD;  Location: [facility] ORS;  Service: Orthopedics;  Laterality: N/A;  . KYPHOPLASTY N/A 12/24/2018   Procedure: KYPHOPLASTY L3;  Surgeon: [name], MD;  Location: [facility] ORS;  Service: Orthopedics;  Laterality: N/A;  . KYPHOPLASTY N/A 02/18/2019   Procedure: L2 KYPHOPLASTY;  Surgeon: [name], MD;  Location: [facility] ORS;  Service: Orthopedics;  Laterality: N/A;  . RADIOLOGY WITH ANESTHESIA N/A 01/28/2019   Procedure: MRI LUMBER SPINE WITHOUT CONTRAST;  Surgeon: Radiologist, Medication, MD;  Location: [place];  Service: Radiology;  Laterality: N/A;    SOCIAL HISTORY: Social History   Socioeconomic History  . Marital status: Widowed    Spouse name: Not on file  . Number of children: Not on file  . Years of education: Not on file  . Highest education level: Not on file  Occupational History  . Not on file  Tobacco Use  . Smoking status: Former Smoker    Packs/day: 2.00    Years: 35.00    Pack years: 70.00    Types: Cigarettes    Quit date: 11/04/1982    Years since quitting: 36.6  . Smokeless tobacco: Current User    Types: Chew  Vaping Use  . Vaping Use: Never used  Substance and Sexual Activity  . Alcohol use: Not Currently  . Drug use: No  . Sexual activity: Not Currently  Other Topics Concern  . Not on file  Social History Narrative  . Not on file   Social Determinants of Health   Financial Resource Strain: Low Risk   . Difficulty of Paying Living Expenses: Not hard at all  Food Insecurity: No Food Insecurity  . Worried About Charity fundraiser in the Last Year: Never true  . Ran Out of Food in the Last Year: Never true  Transportation Needs: No Transportation Needs  . Lack of Transportation (Medical): No  . Lack of Transportation (Non-Medical): No  Physical Activity: Unknown  . Days of Exercise per Week: 0 days  . Minutes  of Exercise per Session: Not on file  Stress: No Stress Concern Present  . Feeling of Stress : Not at all  Social Connections:   . Frequency of Communication with Friends and Family:   . Frequency of Social Gatherings with Friends and Family:   . Attends Religious Services:   . Active Member of Clubs or Organizations:   . Attends Archivist Meetings:   Marland Kitchen Marital Status:   Intimate Partner Violence: Unknown  . Fear of Current or Ex-Partner: Patient refused  . Emotionally Abused: Patient refused  . Physically Abused: Patient refused  . Sexually Abused: Patient refused    FAMILY HISTORY: Family History  Problem Relation Age of Onset  . COPD Mother   . Cancer Father     ALLERGIES:  is allergic to codeine.  MEDICATIONS:  Current Outpatient Medications  Medication Sig Dispense Refill  . acetaminophen (TYLENOL) 325 MG tablet Take 2 tablets (650 mg total) by mouth every 6 (six) hours as needed for mild pain (or Fever >/= 101).    Marland Kitchen albuterol (VENTOLIN HFA) 108 (90 Base) MCG/ACT inhaler Inhale 2 puffs into the lungs every 6 (six) hours as needed for wheezing or  shortness of breath. 1 Inhaler 2  . amLODipine (NORVASC) 2.5 MG tablet Take 2.5 mg by mouth daily.    Marland Kitchen ascorbic acid (VITAMIN C) 500 MG tablet Take 500 mg by mouth at bedtime.    . docusate sodium (COLACE) 100 MG capsule Take 1 capsule (100 mg total) by mouth 2 (two) times daily. (Patient not taking: Reported on 05/27/2019) 60 capsule 0  . FERREX 150 150 MG capsule     . HYDROcodone-acetaminophen (NORCO) 5-325 MG tablet Take 1 tablet by mouth every 6 (six) hours as needed for moderate pain or severe pain. (Patient not taking: Reported on 04/30/2019) 9 tablet 0  . insulin aspart (NOVOLOG) 100 UNIT/ML injection 4 units prior to meals if sugars above 200 10 mL 11  . LEVEMIR 100 UNIT/ML injection Inject 0.04 mLs (4 Units total) into the skin at bedtime. 10 mL 0  . metoCLOPramide (REGLAN) 5 MG tablet Take 5 mg by mouth 4  (four) times daily -  before meals and at bedtime.    . pantoprazole (PROTONIX) 40 MG tablet Take 1 tablet (40 mg total) by mouth daily.    . polyethylene glycol powder (GLYCOLAX/MIRALAX) 17 GM/SCOOP powder Take 17 g by mouth every other day.     . potassium chloride (KLOR-CON) 10 MEQ tablet Take 10 mEq by mouth every evening.    . pravastatin (PRAVACHOL) 10 MG tablet Take 10 mg by mouth at bedtime.    . promethazine (PHENERGAN) 25 MG tablet Take 25 mg by mouth every 4 (four) hours as needed for nausea or vomiting.    . tamsulosin (FLOMAX) 0.4 MG CAPS capsule Take 0.4 mg by mouth daily.    Marland Kitchen tiotropium (SPIRIVA) 18 MCG inhalation capsule Place 18 mcg into inhaler and inhale daily.    Marland Kitchen torsemide (DEMADEX) 20 MG tablet Take 20 mg by mouth every Monday, Wednesday, and Friday.     No current facility-administered medications for this visit.     PHYSICAL EXAMINATION: ECOG PERFORMANCE STATUS: 3 - Symptomatic, >50% confined to bed Vitals:   06/27/19 0856  BP: (!) 126/51  Pulse: 76  Resp: 20  Temp: 98 F (36.7 C)  SpO2: 100%   Filed Weights   06/27/19 0856  Weight: 188 lb 12.8 oz (85.6 kg)    Physical Exam Constitutional:      General: He is not in acute distress.    Appearance: He is ill-appearing.     Comments: Sits in wheel chair  HENT:     Head: Normocephalic and atraumatic.  Eyes:     General: No scleral icterus.    Pupils: Pupils are equal, round, and reactive to light.  Cardiovascular:     Rate and Rhythm: Normal rate and regular rhythm.     Heart sounds: Normal heart sounds.  Pulmonary:     Effort: Pulmonary effort is normal. No respiratory distress.     Breath sounds: Normal breath sounds. No wheezing.     Comments: On nasal cannula oxygen. Decreased breath sounds at the bases. Abdominal:     General: Bowel sounds are normal. There is no distension.     Palpations: Abdomen is soft. There is no mass.     Tenderness: There is no abdominal tenderness.    Musculoskeletal:        General: No deformity. Normal range of motion.     Cervical back: Normal range of motion and neck supple.     Comments: Chronic bilateral lower extremity edema.   Skin:  General: Skin is warm and dry.     Coloration: Skin is not pale.     Findings: No erythema or rash.  Neurological:     Mental Status: He is alert and oriented to person, place, and time. Mental status is at baseline.     Cranial Nerves: No cranial nerve deficit.     Coordination: Coordination normal.  Psychiatric:        Mood and Affect: Mood normal.     LABORATORY DATA:  I have reviewed the data as listed Lab Results  Component Value Date   WBC 6.2 06/27/2019   HGB 7.2 (L) 06/27/2019   HCT 22.9 (L) 06/27/2019   MCV 93.1 06/27/2019   PLT 277 06/27/2019   Recent Labs    01/23/19 0826 01/28/19 0828 02/21/19 1847 02/21/19 1847 03/04/19 1220 03/05/19 0820 03/07/19 0722 03/08/19 0959 03/10/19 0538  NA  --    < > 135   < > 133*   < > 132* 131* 130*  K  --    < > 4.2   < > 3.3*   < > 3.8 3.7 3.9  CL  --    < > 94*   < > 95*   < > 95* 91* 93*  CO2  --    < > 29   < > 28   < > 29 30 32  GLUCOSE  --    < > 120*   < > 161*   < > 151* 195* 116*  BUN  --    < > 30*   < > 18   < > 34* 36* 27*  CREATININE  --    < > 1.74*   < > 1.54*   < > 1.94* 1.73* 1.59*  CALCIUM  --    < > 8.6*   < > 8.3*   < > 8.1* 8.1* 8.3*  GFRNONAA  --    < > 35*   < > 41*   < > 31* 36* 40*  GFRAA  --    < > 41*   < > 48*   < > 36* 41* 46*  PROT 7.8  --  7.2  --  6.9  --   --   --   --   ALBUMIN 3.2*  --  3.2*  --  3.3*  --   --   --   --   AST 13*  --  11*  --  9*  --   --   --   --   ALT 12  --  6  --  8  --   --   --   --   ALKPHOS 77  --  67  --  73  --   --   --   --   BILITOT 0.6  --  0.6  --  0.8  --   --   --   --   BILIDIR <0.1  --   --   --   --   --   --   --   --   IBILI NOT CALCULATED  --   --   --   --   --   --   --   --    < > = values in this interval not displayed.   Iron/TIBC/Ferritin/  %Sat    Component Value Date/Time   IRON 18 (L) 05/28/2019 0955   IRON 36 (L) 07/15/2018 1544  TIBC 158 (L) 05/28/2019 0955   TIBC 172 (L) 07/15/2018 1544   FERRITIN 705 (H) 05/28/2019 0955   FERRITIN 535 (H) 07/15/2018 1544   IRONPCTSAT 11 (L) 05/28/2019 0955   IRONPCTSAT 21 07/15/2018 1544      RADIOGRAPHIC STUDIES: I have personally reviewed the radiological images as listed and agreed with the findings in the report. No results found.    ASSESSMENT & PLAN:  1. Anemia of chronic renal failure, stage 3b   2. Iron deficiency anemia, unspecified iron deficiency anemia type    #Anemia of chronic kidney disease, underlying MDS cannot be ruled out.  Patient declines bone marrow biopsy due to multiple other medical problems. Has been on monthly Retacrit 60,000 units  Today's hemoglobin has further decreased to 7.2.  Reticulocyte hemoglobin 27.3.  I suspect that patient has ongoing blood loss .  Patient has had extensive GI work-up in the past with no obvious bleeding source that was discovered. Recommend patient to proceed with Retacrit today.  Also I will recommend additional IV Venofer treatments to increase reticulocyte hemoglobin to be above 30. I had a lengthy discussion with patient's daughter and explained to her that erythropoietin therapy would not be fully effective until iron store being fully replete. Underlying bone marrow process cannot be ruled out and patient declined bone marrow biopsy for further evaluation. I recommend treatments.  IV Venofer Plan IV iron with Venofer 261m weekly x 2 doses. Continue follow-up every 4 weeks for Retacrit injections.  All questions were answered. The patient knows to call the clinic with any problems questions or concerns.   ZEarlie Server MD, PhD Hematology Oncology [facility] LPPager- [phone]/25/2021

## 2019-06-28 DIAGNOSIS — J441 Chronic obstructive pulmonary disease with (acute) exacerbation: Secondary | ICD-10-CM | POA: Diagnosis not present

## 2019-07-03 DIAGNOSIS — J441 Chronic obstructive pulmonary disease with (acute) exacerbation: Secondary | ICD-10-CM | POA: Diagnosis not present

## 2019-07-04 ENCOUNTER — Other Ambulatory Visit: Payer: Self-pay

## 2019-07-04 ENCOUNTER — Inpatient Hospital Stay: Payer: Medicare HMO | Attending: Oncology

## 2019-07-04 VITALS — BP 138/51 | HR 64 | Temp 98.1°F | Resp 19

## 2019-07-04 DIAGNOSIS — E119 Type 2 diabetes mellitus without complications: Secondary | ICD-10-CM | POA: Insufficient documentation

## 2019-07-04 DIAGNOSIS — J439 Emphysema, unspecified: Secondary | ICD-10-CM | POA: Insufficient documentation

## 2019-07-04 DIAGNOSIS — I129 Hypertensive chronic kidney disease with stage 1 through stage 4 chronic kidney disease, or unspecified chronic kidney disease: Secondary | ICD-10-CM | POA: Insufficient documentation

## 2019-07-04 DIAGNOSIS — Z79899 Other long term (current) drug therapy: Secondary | ICD-10-CM | POA: Insufficient documentation

## 2019-07-04 DIAGNOSIS — M549 Dorsalgia, unspecified: Secondary | ICD-10-CM | POA: Insufficient documentation

## 2019-07-04 DIAGNOSIS — D631 Anemia in chronic kidney disease: Secondary | ICD-10-CM | POA: Diagnosis not present

## 2019-07-04 DIAGNOSIS — I509 Heart failure, unspecified: Secondary | ICD-10-CM | POA: Insufficient documentation

## 2019-07-04 DIAGNOSIS — G8929 Other chronic pain: Secondary | ICD-10-CM | POA: Insufficient documentation

## 2019-07-04 DIAGNOSIS — J9610 Chronic respiratory failure, unspecified whether with hypoxia or hypercapnia: Secondary | ICD-10-CM | POA: Insufficient documentation

## 2019-07-04 DIAGNOSIS — E785 Hyperlipidemia, unspecified: Secondary | ICD-10-CM | POA: Insufficient documentation

## 2019-07-04 DIAGNOSIS — Z794 Long term (current) use of insulin: Secondary | ICD-10-CM | POA: Insufficient documentation

## 2019-07-04 DIAGNOSIS — Z87891 Personal history of nicotine dependence: Secondary | ICD-10-CM | POA: Diagnosis not present

## 2019-07-04 DIAGNOSIS — M129 Arthropathy, unspecified: Secondary | ICD-10-CM | POA: Insufficient documentation

## 2019-07-04 DIAGNOSIS — N183 Chronic kidney disease, stage 3 unspecified: Secondary | ICD-10-CM | POA: Insufficient documentation

## 2019-07-04 DIAGNOSIS — Z9981 Dependence on supplemental oxygen: Secondary | ICD-10-CM | POA: Diagnosis not present

## 2019-07-04 MED ORDER — IRON SUCROSE 20 MG/ML IV SOLN
200.0000 mg | Freq: Once | INTRAVENOUS | Status: AC
  Administered 2019-07-04: 200 mg via INTRAVENOUS
  Filled 2019-07-04: qty 10

## 2019-07-04 MED ORDER — SODIUM CHLORIDE 0.9 % IV SOLN: 200.0000 mg | Freq: Once | INTRAVENOUS | Status: DC

## 2019-07-04 MED ORDER — SODIUM CHLORIDE 0.9 % IV SOLN
Freq: Once | INTRAVENOUS | Status: AC
  Filled 2019-07-04: qty 250

## 2019-07-11 ENCOUNTER — Inpatient Hospital Stay: Payer: Medicare HMO

## 2019-07-11 ENCOUNTER — Other Ambulatory Visit: Payer: Self-pay

## 2019-07-11 VITALS — BP 146/61 | HR 62

## 2019-07-11 DIAGNOSIS — D631 Anemia in chronic kidney disease: Secondary | ICD-10-CM

## 2019-07-11 DIAGNOSIS — N183 Chronic kidney disease, stage 3 unspecified: Secondary | ICD-10-CM | POA: Diagnosis not present

## 2019-07-11 DIAGNOSIS — Z79899 Other long term (current) drug therapy: Secondary | ICD-10-CM | POA: Diagnosis not present

## 2019-07-11 DIAGNOSIS — I129 Hypertensive chronic kidney disease with stage 1 through stage 4 chronic kidney disease, or unspecified chronic kidney disease: Secondary | ICD-10-CM | POA: Diagnosis not present

## 2019-07-11 DIAGNOSIS — J439 Emphysema, unspecified: Secondary | ICD-10-CM | POA: Diagnosis not present

## 2019-07-11 DIAGNOSIS — I509 Heart failure, unspecified: Secondary | ICD-10-CM | POA: Diagnosis not present

## 2019-07-11 DIAGNOSIS — E785 Hyperlipidemia, unspecified: Secondary | ICD-10-CM | POA: Diagnosis not present

## 2019-07-11 DIAGNOSIS — E119 Type 2 diabetes mellitus without complications: Secondary | ICD-10-CM | POA: Diagnosis not present

## 2019-07-11 DIAGNOSIS — J9610 Chronic respiratory failure, unspecified whether with hypoxia or hypercapnia: Secondary | ICD-10-CM | POA: Diagnosis not present

## 2019-07-11 MED ORDER — SODIUM CHLORIDE 0.9 % IV SOLN: 200.0000 mg | Freq: Once | INTRAVENOUS | Status: DC

## 2019-07-11 MED ORDER — SODIUM CHLORIDE 0.9 % IV SOLN
Freq: Once | INTRAVENOUS | Status: AC
  Filled 2019-07-11: qty 250

## 2019-07-11 MED ORDER — IRON SUCROSE 20 MG/ML IV SOLN
200.0000 mg | Freq: Once | INTRAVENOUS | Status: AC
  Administered 2019-07-11: 200 mg via INTRAVENOUS
  Filled 2019-07-11: qty 10

## 2019-07-11 NOTE — Progress Notes (Signed)
Pt tolerated infusion without issues, vital signs and patient stable.

## 2019-07-23 ENCOUNTER — Other Ambulatory Visit: Payer: Self-pay

## 2019-07-23 ENCOUNTER — Inpatient Hospital Stay: Payer: Medicare HMO

## 2019-07-23 DIAGNOSIS — N183 Chronic kidney disease, stage 3 unspecified: Secondary | ICD-10-CM | POA: Diagnosis not present

## 2019-07-23 DIAGNOSIS — I129 Hypertensive chronic kidney disease with stage 1 through stage 4 chronic kidney disease, or unspecified chronic kidney disease: Secondary | ICD-10-CM | POA: Diagnosis not present

## 2019-07-23 DIAGNOSIS — D631 Anemia in chronic kidney disease: Secondary | ICD-10-CM

## 2019-07-23 DIAGNOSIS — E119 Type 2 diabetes mellitus without complications: Secondary | ICD-10-CM | POA: Diagnosis not present

## 2019-07-23 DIAGNOSIS — D509 Iron deficiency anemia, unspecified: Secondary | ICD-10-CM

## 2019-07-23 DIAGNOSIS — E785 Hyperlipidemia, unspecified: Secondary | ICD-10-CM | POA: Diagnosis not present

## 2019-07-23 DIAGNOSIS — Z79899 Other long term (current) drug therapy: Secondary | ICD-10-CM | POA: Diagnosis not present

## 2019-07-23 DIAGNOSIS — I509 Heart failure, unspecified: Secondary | ICD-10-CM | POA: Diagnosis not present

## 2019-07-23 DIAGNOSIS — J439 Emphysema, unspecified: Secondary | ICD-10-CM | POA: Diagnosis not present

## 2019-07-23 DIAGNOSIS — J9610 Chronic respiratory failure, unspecified whether with hypoxia or hypercapnia: Secondary | ICD-10-CM | POA: Diagnosis not present

## 2019-07-23 LAB — COMPREHENSIVE METABOLIC PANEL
ALT: 8 U/L (ref 0–44)
AST: 9 U/L — ABNORMAL LOW (ref 15–41)
Albumin: 3.7 g/dL (ref 3.5–5.0)
Alkaline Phosphatase: 65 U/L (ref 38–126)
Anion gap: 10 (ref 5–15)
BUN: 41 mg/dL — ABNORMAL HIGH (ref 8–23)
CO2: 25 mmol/L (ref 22–32)
Calcium: 8.4 mg/dL — ABNORMAL LOW (ref 8.9–10.3)
Chloride: 99 mmol/L (ref 98–111)
Creatinine, Ser: 2.22 mg/dL — ABNORMAL HIGH (ref 0.61–1.24)
GFR calc Af Amer: 30 mL/min — ABNORMAL LOW (ref >60)
GFR calc non Af Amer: 26 mL/min — ABNORMAL LOW (ref >60)
Glucose, Bld: 143 mg/dL — ABNORMAL HIGH (ref 70–99)
Potassium: 4 mmol/L (ref 3.5–5.1)
Sodium: 134 mmol/L — ABNORMAL LOW (ref 135–145)
Total Bilirubin: 0.6 mg/dL (ref 0.3–1.2)
Total Protein: 7.7 g/dL (ref 6.5–8.1)

## 2019-07-23 LAB — CBC WITH DIFFERENTIAL/PLATELET
Abs Immature Granulocytes: 0.03 10*3/uL (ref 0.00–0.07)
Basophils Absolute: 0.1 10*3/uL (ref 0.0–0.1)
Basophils Relative: 1 %
Eosinophils Absolute: 0.2 10*3/uL (ref 0.0–0.5)
Eosinophils Relative: 4 %
HCT: 21.9 % — ABNORMAL LOW (ref 39.0–52.0)
Hemoglobin: 6.8 g/dL — ABNORMAL LOW (ref 13.0–17.0)
Immature Granulocytes: 1 %
Lymphocytes Relative: 27 %
Lymphs Abs: 1.5 10*3/uL (ref 0.7–4.0)
MCH: 28.6 pg (ref 26.0–34.0)
MCHC: 31.1 g/dL (ref 30.0–36.0)
MCV: 92 fL (ref 80.0–100.0)
Monocytes Absolute: 0.6 10*3/uL (ref 0.1–1.0)
Monocytes Relative: 10 %
Neutro Abs: 3.2 10*3/uL (ref 1.7–7.7)
Neutrophils Relative %: 57 %
Platelets: 272 10*3/uL (ref 150–400)
RBC: 2.38 MIL/uL — ABNORMAL LOW (ref 4.22–5.81)
RDW: 20.4 % — ABNORMAL HIGH (ref 11.5–15.5)
WBC: 5.7 10*3/uL (ref 4.0–10.5)
nRBC: 0 % (ref 0.0–0.2)

## 2019-07-23 LAB — IRON AND TIBC
Iron: 57 ug/dL (ref 45–182)
Saturation Ratios: 34 % (ref 17.9–39.5)
TIBC: 168 ug/dL — ABNORMAL LOW (ref 250–450)
UIBC: 111 ug/dL

## 2019-07-23 LAB — RETIC PANEL
Immature Retic Fract: 12.3 % (ref 2.3–15.9)
RBC.: 2.4 MIL/uL — ABNORMAL LOW (ref 4.22–5.81)
Retic Count, Absolute: 19 10*3/uL (ref 19.0–186.0)
Retic Ct Pct: 0.8 % (ref 0.4–3.1)
Reticulocyte Hemoglobin: 26.9 pg — ABNORMAL LOW (ref >27.9)

## 2019-07-23 LAB — FERRITIN: Ferritin: 914 ng/mL — ABNORMAL HIGH (ref 24–336)

## 2019-07-24 ENCOUNTER — Encounter: Payer: Self-pay | Admitting: Oncology

## 2019-07-24 ENCOUNTER — Telehealth: Payer: Self-pay

## 2019-07-24 DIAGNOSIS — N189 Chronic kidney disease, unspecified: Secondary | ICD-10-CM

## 2019-07-24 LAB — KAPPA/LAMBDA LIGHT CHAINS
Kappa free light chain: 171 mg/L — ABNORMAL HIGH (ref 3.3–19.4)
Kappa, lambda light chain ratio: 1.6 (ref 0.26–1.65)
Lambda free light chains: 106.7 mg/L — ABNORMAL HIGH (ref 5.7–26.3)

## 2019-07-24 NOTE — Telephone Encounter (Signed)
Please move appts on 7/23 form PM to AM for lab/MD/blood tranfusion. Daughter, [name], is aware that appts will be changed to the morning due to blood transfusion. Please call her and give her appt details and tell her he will need to have a lab tomorrow before he see's [name]. We need a type and screen before the transfusion. Thanks.

## 2019-07-24 NOTE — Telephone Encounter (Signed)
-----   Message from Earlie Server, MD sent at 07/24/2019  8:26 AM EDT ----- Hemoglobin 6.8, please check if patient can get blood transfusion today.  If no availability, please advise patient to go to emergency room for blood transfusion.  He may keep his appointment on 07/25/2019.  Thank you

## 2019-07-24 NOTE — Telephone Encounter (Signed)
Done... Pts daughter "[name]" is aware of his sched 07/25/19 appts time

## 2019-07-25 ENCOUNTER — Inpatient Hospital Stay: Payer: Medicare HMO

## 2019-07-25 ENCOUNTER — Inpatient Hospital Stay (HOSPITAL_BASED_OUTPATIENT_CLINIC_OR_DEPARTMENT_OTHER): Payer: Medicare HMO | Admitting: Oncology

## 2019-07-25 ENCOUNTER — Other Ambulatory Visit: Payer: Self-pay | Admitting: Oncology

## 2019-07-25 ENCOUNTER — Other Ambulatory Visit: Payer: Self-pay

## 2019-07-25 ENCOUNTER — Encounter: Payer: Self-pay | Admitting: Oncology

## 2019-07-25 VITALS — BP 142/78 | HR 72 | Temp 97.7°F | Resp 18 | Ht 68.0 in | Wt 188.7 lb

## 2019-07-25 DIAGNOSIS — N184 Chronic kidney disease, stage 4 (severe): Secondary | ICD-10-CM

## 2019-07-25 DIAGNOSIS — D631 Anemia in chronic kidney disease: Secondary | ICD-10-CM

## 2019-07-25 DIAGNOSIS — I509 Heart failure, unspecified: Secondary | ICD-10-CM | POA: Diagnosis not present

## 2019-07-25 DIAGNOSIS — N183 Chronic kidney disease, stage 3 unspecified: Secondary | ICD-10-CM | POA: Diagnosis not present

## 2019-07-25 DIAGNOSIS — I129 Hypertensive chronic kidney disease with stage 1 through stage 4 chronic kidney disease, or unspecified chronic kidney disease: Secondary | ICD-10-CM | POA: Diagnosis not present

## 2019-07-25 DIAGNOSIS — E119 Type 2 diabetes mellitus without complications: Secondary | ICD-10-CM | POA: Diagnosis not present

## 2019-07-25 DIAGNOSIS — J439 Emphysema, unspecified: Secondary | ICD-10-CM | POA: Diagnosis not present

## 2019-07-25 DIAGNOSIS — E785 Hyperlipidemia, unspecified: Secondary | ICD-10-CM | POA: Diagnosis not present

## 2019-07-25 DIAGNOSIS — J9610 Chronic respiratory failure, unspecified whether with hypoxia or hypercapnia: Secondary | ICD-10-CM | POA: Diagnosis not present

## 2019-07-25 DIAGNOSIS — D649 Anemia, unspecified: Secondary | ICD-10-CM

## 2019-07-25 DIAGNOSIS — Z79899 Other long term (current) drug therapy: Secondary | ICD-10-CM | POA: Diagnosis not present

## 2019-07-25 LAB — PREPARE RBC (CROSSMATCH)

## 2019-07-25 MED ORDER — DIPHENHYDRAMINE HCL 25 MG PO CAPS
25.0000 mg | ORAL_CAPSULE | Freq: Once | ORAL | Status: AC
  Administered 2019-07-25: 25 mg via ORAL
  Filled 2019-07-25: qty 1

## 2019-07-25 MED ORDER — FUROSEMIDE 10 MG/ML IJ SOLN
20.0000 mg | Freq: Once | INTRAMUSCULAR | Status: AC
  Administered 2019-07-25: 20 mg via INTRAVENOUS
  Filled 2019-07-25: qty 2

## 2019-07-25 MED ORDER — SODIUM CHLORIDE 0.9% IV SOLUTION
250.0000 mL | Freq: Once | INTRAVENOUS | Status: AC
  Administered 2019-07-25: 250 mL via INTRAVENOUS
  Filled 2019-07-25: qty 250

## 2019-07-25 MED ORDER — ACETAMINOPHEN 325 MG PO TABS
650.0000 mg | ORAL_TABLET | Freq: Once | ORAL | Status: AC
  Administered 2019-07-25: 650 mg via ORAL
  Filled 2019-07-25: qty 2

## 2019-07-25 NOTE — Progress Notes (Signed)
Hematology/Oncology  [facility] [phone] [phone]   Patient Care Team: Baxter Hire, MD as PCP - General (Internal Medicine) Earlie Server, MD as Consulting Physician (Oncology)  REFERRING PROVIDER: Baxter Hire, MD  CHIEF COMPLAINTS/REASON FOR VISIT:  Follow-up for anemia secondary to chronic kidney disease.  HISTORY OF PRESENTING ILLNESS:   Arthur [name] is a  86 y.o.  male with PMH listed below was seen in consultation at the request of  Baxter Hire, MD  for evaluation of anemia Patient has chronic comorbidities including diabetes, COPD/emphysema, chronic respiratory failure on home oxygen, CKD stage III, CHF, hypertension and hyperlipidemia. Reviewed patient's previous lab records. Extensive medical records review was performed by me. Anemia has been chronic, dated back to at least 2016.  At that time his hemoglobin was around 11-12. Hemoglobin started to trended down below 10 starting February 2020. 02/11/2018 hemoglobin 9, hematocrit 28.6, 06/17/2018 hemoglobin 7.3 hematocrit of 24, 06/27/2018 hemoglobin 5.8 07/15/2018 hemoglobin 7.5 07/29/2018 hemoglobin 8.2, 08/12/2018, hemoglobin 9.2   INTERVAL HISTORY Arthur [name] is a 86 y.o. male who has above history reviewed by me today presents for follow up visit for management of anemia Problems and complaints are listed below: Patient is here by himself.  Daughter has another appointment and cannot be here with him.  I called her daughter and updated her Patient reports feeling " okay".  He is a poor historian. Chronic back pain.  Review of Systems  Constitutional: Positive for fatigue. Negative for appetite change, chills, fever and unexpected weight change.  HENT:   Negative for hearing loss and voice change.   Eyes: Negative for eye problems and icterus.  Respiratory: Negative for chest tightness, cough and shortness of breath.   Cardiovascular: Negative for chest pain  and leg swelling.  Gastrointestinal: Negative for abdominal distention and abdominal pain.  Endocrine: Negative for hot flashes.  Genitourinary: Negative for difficulty urinating, dysuria and frequency.   Musculoskeletal: Negative for arthralgias.       Chronic back pain  Skin: Negative for itching and rash.  Neurological: Negative for light-headedness and numbness.  Hematological: Negative for adenopathy. Does not bruise/bleed easily.  Psychiatric/Behavioral: Negative for confusion.    MEDICAL HISTORY:  Past Medical History:  Diagnosis Date  . Anemia of chronic kidney failure 08/21/2018  . Arthritis    lower back  . CHF (congestive heart failure) ([place])   . CKD (chronic kidney disease), stage III   . COPD (chronic obstructive pulmonary disease) ([place])   . Diabetes mellitus without complication ([place])    type 2  . Dyspnea    when active -wears oxygen 2L via [place]  . History of blood transfusion   . Hyperlipidemia   . Hypertension   . Requires continuous at home supplemental oxygen    2L via [place]    SURGICAL HISTORY: Past Surgical History:  Procedure Laterality Date  . COLONOSCOPY WITH PROPOFOL N/A 06/11/2018   Procedure: COLONOSCOPY WITH PROPOFOL;  Surgeon: Jonathon Bellows, MD;  Location: [facility] ENDOSCOPY;  Service: Gastroenterology;  Laterality: N/A;  . COLONOSCOPY WITH PROPOFOL N/A 06/13/2018   Procedure: COLONOSCOPY WITH PROPOFOL;  Surgeon: Jonathon Bellows, MD;  Location: [facility] ENDOSCOPY;  Service: Gastroenterology;  Laterality: N/A;  . ESOPHAGOGASTRODUODENOSCOPY Left 06/10/2018   Procedure: ESOPHAGOGASTRODUODENOSCOPY (EGD);  Surgeon: Jonathon Bellows, MD;  Location: [facility] ENDOSCOPY;  Service: Gastroenterology;  Laterality: Left;  . EYE SURGERY     removed cataracts  . GIVENS CAPSULE STUDY N/A 06/28/2018   Procedure: GIVENS  CAPSULE STUDY;  Surgeon: Lucilla Lame, MD;  Location: [facility] ENDOSCOPY;  Service: Endoscopy;  Laterality: N/A;  . GIVENS CAPSULE STUDY N/A 06/30/2018   Procedure: GIVENS CAPSULE  STUDY;  Surgeon: Jonathon Bellows, MD;  Location: [facility] ENDOSCOPY;  Service: Gastroenterology;  Laterality: N/A;  . KYPHOPLASTY N/A 10/31/2018   Procedure: KYPHOPLASTY L2;  Surgeon: [name], MD;  Location: [facility] ORS;  Service: Orthopedics;  Laterality: N/A;  . KYPHOPLASTY N/A 12/24/2018   Procedure: KYPHOPLASTY L3;  Surgeon: [name], MD;  Location: [facility] ORS;  Service: Orthopedics;  Laterality: N/A;  . KYPHOPLASTY N/A 02/18/2019   Procedure: L2 KYPHOPLASTY;  Surgeon: [name], MD;  Location: [facility] ORS;  Service: Orthopedics;  Laterality: N/A;  . RADIOLOGY WITH ANESTHESIA N/A 01/28/2019   Procedure: MRI LUMBER SPINE WITHOUT CONTRAST;  Surgeon: Radiologist, Medication, MD;  Location: [place];  Service: Radiology;  Laterality: N/A;    SOCIAL HISTORY: Social History   Socioeconomic History  . Marital status: Widowed    Spouse name: Not on file  . Number of children: Not on file  . Years of education: Not on file  . Highest education level: Not on file  Occupational History  . Not on file  Tobacco Use  . Smoking status: Former Smoker    Packs/day: 2.00    Years: 35.00    Pack years: 70.00    Types: Cigarettes    Quit date: 11/04/1982    Years since quitting: 36.7  . Smokeless tobacco: Current User    Types: Chew  Vaping Use  . Vaping Use: Never used  Substance and Sexual Activity  . Alcohol use: Not Currently  . Drug use: No  . Sexual activity: Not Currently  Other Topics Concern  . Not on file  Social History Narrative  . Not on file   Social Determinants of Health   Financial Resource Strain:   . Difficulty of Paying Living Expenses:   Food Insecurity:   . Worried About Charity fundraiser in the Last Year:   . Arboriculturist in the Last Year:   Transportation Needs:   . Film/video editor (Medical):   Marland Kitchen Lack of Transportation (Non-Medical):   Physical Activity:   . Days of Exercise per Week:   . Minutes of Exercise per Session:   Stress:   . Feeling of  Stress :   Social Connections:   . Frequency of Communication with Friends and Family:   . Frequency of Social Gatherings with Friends and Family:   . Attends Religious Services:   . Active Member of Clubs or Organizations:   . Attends Archivist Meetings:   Marland Kitchen Marital Status:   Intimate Partner Violence:   . Fear of Current or Ex-Partner:   . Emotionally Abused:   Marland Kitchen Physically Abused:   . Sexually Abused:     FAMILY HISTORY: Family History  Problem Relation Age of Onset  . COPD Mother   . Cancer Father     ALLERGIES:  is allergic to codeine.  MEDICATIONS:  Current Outpatient Medications  Medication Sig Dispense Refill  . acetaminophen (TYLENOL) 325 MG tablet Take 2 tablets (650 mg total) by mouth every 6 (six) hours as needed for mild pain (or Fever >/= 101).    Marland Kitchen albuterol (VENTOLIN HFA) 108 (90 Base) MCG/ACT inhaler Inhale 2 puffs into the lungs every 6 (six) hours as needed for wheezing or shortness of breath. 1 Inhaler 2  . amLODipine (NORVASC) 2.5 MG tablet Take  2.5 mg by mouth daily.    Marland Kitchen ascorbic acid (VITAMIN C) 500 MG tablet Take 500 mg by mouth at bedtime.    . docusate sodium (COLACE) 100 MG capsule Take 1 capsule (100 mg total) by mouth 2 (two) times daily. 60 capsule 0  . FERREX 150 150 MG capsule     . HYDROcodone-acetaminophen (NORCO) 5-325 MG tablet Take 1 tablet by mouth every 6 (six) hours as needed for moderate pain or severe pain. 9 tablet 0  . insulin aspart (NOVOLOG) 100 UNIT/ML injection 4 units prior to meals if sugars above 200 10 mL 11  . LEVEMIR 100 UNIT/ML injection Inject 0.04 mLs (4 Units total) into the skin at bedtime. 10 mL 0  . metoCLOPramide (REGLAN) 5 MG tablet Take 5 mg by mouth 4 (four) times daily -  before meals and at bedtime.    . pantoprazole (PROTONIX) 40 MG tablet Take 1 tablet (40 mg total) by mouth daily.    . polyethylene glycol powder (GLYCOLAX/MIRALAX) 17 GM/SCOOP powder Take 17 g by mouth every other day.     .  potassium chloride (KLOR-CON) 10 MEQ tablet Take 10 mEq by mouth every evening.    . pravastatin (PRAVACHOL) 10 MG tablet Take 10 mg by mouth at bedtime.    . promethazine (PHENERGAN) 25 MG tablet Take 25 mg by mouth every 4 (four) hours as needed for nausea or vomiting.    . tamsulosin (FLOMAX) 0.4 MG CAPS capsule Take 0.4 mg by mouth daily.    Marland Kitchen tiotropium (SPIRIVA) 18 MCG inhalation capsule Place 18 mcg into inhaler and inhale daily.    Marland Kitchen torsemide (DEMADEX) 20 MG tablet Take 20 mg by mouth every Monday, Wednesday, and Friday.    . TRUE METRIX BLOOD GLUCOSE TEST test strip 1 each by Other route every morning.     No current facility-administered medications for this visit.     PHYSICAL EXAMINATION: ECOG PERFORMANCE STATUS: 3 - Symptomatic, >50% confined to bed There were no vitals filed for this visit. There were no vitals filed for this visit.  Physical Exam Constitutional:      General: He is not in acute distress.    Appearance: He is ill-appearing.     Comments: Sits in wheel chair  HENT:     Head: Normocephalic and atraumatic.  Eyes:     General: No scleral icterus.    Pupils: Pupils are equal, round, and reactive to light.  Cardiovascular:     Rate and Rhythm: Normal rate and regular rhythm.     Heart sounds: Normal heart sounds.  Pulmonary:     Effort: Pulmonary effort is normal. No respiratory distress.     Breath sounds: No wheezing.     Comments:  Decreased breath sounds at the bases. Abdominal:     General: Bowel sounds are normal. There is no distension.     Palpations: Abdomen is soft. There is no mass.     Tenderness: There is no abdominal tenderness.  Musculoskeletal:        General: No deformity. Normal range of motion.     Cervical back: Normal range of motion and neck supple.     Comments: Chronic bilateral lower extremity edema.   Skin:    General: Skin is warm and dry.     Coloration: Skin is not pale.     Findings: No erythema or rash.    Neurological:     Mental Status: He is alert and oriented to  person, place, and time. Mental status is at baseline.     Cranial Nerves: No cranial nerve deficit.     Coordination: Coordination normal.  Psychiatric:        Mood and Affect: Mood normal.     LABORATORY DATA:  I have reviewed the data as listed Lab Results  Component Value Date   WBC 5.7 07/23/2019   HGB 6.8 (L) 07/23/2019   HCT 21.9 (L) 07/23/2019   MCV 92.0 07/23/2019   PLT 272 07/23/2019   Recent Labs    01/23/19 0826 01/28/19 0828 02/21/19 1847 02/21/19 1847 03/04/19 1220 03/05/19 0820 03/08/19 0959 03/10/19 0538 07/23/19 1535  NA  --    < > 135   < > 133*   < > 131* 130* 134*  K  --    < > 4.2   < > 3.3*   < > 3.7 3.9 4.0  CL  --    < > 94*   < > 95*   < > 91* 93* 99  CO2  --    < > 29   < > 28   < > 30 32 25  GLUCOSE  --    < > 120*   < > 161*   < > 195* 116* 143*  BUN  --    < > 30*   < > 18   < > 36* 27* 41*  CREATININE  --    < > 1.74*   < > 1.54*   < > 1.73* 1.59* 2.22*  CALCIUM  --    < > 8.6*   < > 8.3*   < > 8.1* 8.3* 8.4*  GFRNONAA  --    < > 35*   < > 41*   < > 36* 40* 26*  GFRAA  --    < > 41*   < > 48*   < > 41* 46* 30*  PROT 7.8  --  7.2  --  6.9  --   --   --  7.7  ALBUMIN 3.2*  --  3.2*  --  3.3*  --   --   --  3.7  AST 13*  --  11*  --  9*  --   --   --  9*  ALT 12  --  6  --  8  --   --   --  8  ALKPHOS 77  --  67  --  73  --   --   --  65  BILITOT 0.6  --  0.6  --  0.8  --   --   --  0.6  BILIDIR <0.1  --   --   --   --   --   --   --   --   IBILI NOT CALCULATED  --   --   --   --   --   --   --   --    < > = values in this interval not displayed.   Iron/TIBC/Ferritin/ %Sat    Component Value Date/Time   IRON 57 07/23/2019 1535   IRON 36 (L) 07/15/2018 1544   TIBC 168 (L) 07/23/2019 1535   TIBC 172 (L) 07/15/2018 1544   FERRITIN 914 (H) 07/23/2019 1535   FERRITIN 535 (H) 07/15/2018 1544   IRONPCTSAT 34 07/23/2019 1535   IRONPCTSAT 21 07/15/2018 1544       RADIOGRAPHIC STUDIES: I have personally reviewed the  radiological images as listed and agreed with the findings in the report. No results found.    ASSESSMENT & PLAN:  1. CKD (chronic kidney disease) stage 4, GFR 15-29 ml/min (HCC)   2. Symptomatic anemia     #Anemia of chronic kidney disease,  Suspect MDS cannot be ruled out.  Patient declines bone marrow biopsy due to multiple other medical problems. Labs are reviewed and discussed with patient. Hemoglobin dropped to 6.8, iron panel showed improved iron level.  Patient has been on erythropoietin replacement therapy 60,000 units monthly.  I suspect that he has underlying MDS. -Proceed with 1 unit of blood transfusion today. -Schedule retacrit next week.  All questions were answered. The patient knows to call the clinic with any problems questions or concerns.   Earlie Server, MD, PhD Hematology Oncology [facility] Pager- [phone] 07/25/2019

## 2019-07-25 NOTE — Progress Notes (Signed)
No new changes noted today 

## 2019-07-26 LAB — TYPE AND SCREEN
ABO/RH(D): A POS
Antibody Screen: NEGATIVE
Unit division: 0

## 2019-07-26 LAB — BPAM RBC
Blood Product Expiration Date: 202108162359
ISSUE DATE / TIME: 202107230949
Unit Type and Rh: 6200

## 2019-07-28 DIAGNOSIS — J441 Chronic obstructive pulmonary disease with (acute) exacerbation: Secondary | ICD-10-CM | POA: Diagnosis not present

## 2019-07-28 LAB — MULTIPLE MYELOMA PANEL, SERUM
Albumin SerPl Elph-Mcnc: 3.7 g/dL (ref 2.9–4.4)
Albumin/Glob SerPl: 1.1 (ref 0.7–1.7)
Alpha 1: 0.2 g/dL (ref 0.0–0.4)
Alpha2 Glob SerPl Elph-Mcnc: 0.6 g/dL (ref 0.4–1.0)
B-Globulin SerPl Elph-Mcnc: 0.9 g/dL (ref 0.7–1.3)
Gamma Glob SerPl Elph-Mcnc: 1.6 g/dL (ref 0.4–1.8)
Globulin, Total: 3.4 g/dL (ref 2.2–3.9)
IgA: 493 mg/dL — ABNORMAL HIGH (ref 61–437)
IgG (Immunoglobin G), Serum: 1631 mg/dL — ABNORMAL HIGH (ref 603–1613)
IgM (Immunoglobulin M), Srm: 55 mg/dL (ref 15–143)
Total Protein ELP: 7.1 g/dL (ref 6.0–8.5)

## 2019-08-01 ENCOUNTER — Inpatient Hospital Stay: Payer: Medicare HMO

## 2019-08-01 ENCOUNTER — Other Ambulatory Visit: Payer: Self-pay

## 2019-08-01 ENCOUNTER — Other Ambulatory Visit: Payer: Medicare HMO | Admitting: Nurse Practitioner

## 2019-08-01 VITALS — BP 123/67 | HR 67

## 2019-08-01 DIAGNOSIS — Z79899 Other long term (current) drug therapy: Secondary | ICD-10-CM | POA: Diagnosis not present

## 2019-08-01 DIAGNOSIS — J9610 Chronic respiratory failure, unspecified whether with hypoxia or hypercapnia: Secondary | ICD-10-CM | POA: Diagnosis not present

## 2019-08-01 DIAGNOSIS — N183 Chronic kidney disease, stage 3 unspecified: Secondary | ICD-10-CM | POA: Diagnosis not present

## 2019-08-01 DIAGNOSIS — I129 Hypertensive chronic kidney disease with stage 1 through stage 4 chronic kidney disease, or unspecified chronic kidney disease: Secondary | ICD-10-CM | POA: Diagnosis not present

## 2019-08-01 DIAGNOSIS — I509 Heart failure, unspecified: Secondary | ICD-10-CM | POA: Diagnosis not present

## 2019-08-01 DIAGNOSIS — N189 Chronic kidney disease, unspecified: Secondary | ICD-10-CM

## 2019-08-01 DIAGNOSIS — J439 Emphysema, unspecified: Secondary | ICD-10-CM | POA: Diagnosis not present

## 2019-08-01 DIAGNOSIS — E119 Type 2 diabetes mellitus without complications: Secondary | ICD-10-CM | POA: Diagnosis not present

## 2019-08-01 DIAGNOSIS — E785 Hyperlipidemia, unspecified: Secondary | ICD-10-CM | POA: Diagnosis not present

## 2019-08-01 DIAGNOSIS — D631 Anemia in chronic kidney disease: Secondary | ICD-10-CM | POA: Diagnosis not present

## 2019-08-01 DIAGNOSIS — N184 Chronic kidney disease, stage 4 (severe): Secondary | ICD-10-CM

## 2019-08-01 LAB — CBC WITH DIFFERENTIAL/PLATELET
Abs Immature Granulocytes: 0.01 10*3/uL (ref 0.00–0.07)
Basophils Absolute: 0.1 10*3/uL (ref 0.0–0.1)
Basophils Relative: 1 %
Eosinophils Absolute: 0.3 10*3/uL (ref 0.0–0.5)
Eosinophils Relative: 5 %
HCT: 25.4 % — ABNORMAL LOW (ref 39.0–52.0)
Hemoglobin: 7.9 g/dL — ABNORMAL LOW (ref 13.0–17.0)
Immature Granulocytes: 0 %
Lymphocytes Relative: 26 %
Lymphs Abs: 1.5 10*3/uL (ref 0.7–4.0)
MCH: 28.9 pg (ref 26.0–34.0)
MCHC: 31.1 g/dL (ref 30.0–36.0)
MCV: 93 fL (ref 80.0–100.0)
Monocytes Absolute: 0.6 10*3/uL (ref 0.1–1.0)
Monocytes Relative: 11 %
Neutro Abs: 3.3 10*3/uL (ref 1.7–7.7)
Neutrophils Relative %: 57 %
Platelets: 260 10*3/uL (ref 150–400)
RBC: 2.73 MIL/uL — ABNORMAL LOW (ref 4.22–5.81)
RDW: 18.9 % — ABNORMAL HIGH (ref 11.5–15.5)
WBC: 5.8 10*3/uL (ref 4.0–10.5)
nRBC: 0 % (ref 0.0–0.2)

## 2019-08-01 MED ORDER — EPOETIN ALFA-EPBX 40000 UNIT/ML IJ SOLN: 60000.0000 [IU] | Freq: Once | INTRAMUSCULAR | Status: DC

## 2019-08-01 MED ORDER — EPOETIN ALFA-EPBX 40000 UNIT/ML IJ SOLN
40000.0000 [IU] | Freq: Once | INTRAMUSCULAR | Status: DC
  Filled 2019-08-01: qty 1

## 2019-08-01 MED ORDER — EPOETIN ALFA-EPBX 10000 UNIT/ML IJ SOLN
20000.0000 [IU] | Freq: Once | INTRAMUSCULAR | Status: AC
  Administered 2019-08-01: 20000 [IU] via SUBCUTANEOUS

## 2019-08-01 MED ORDER — EPOETIN ALFA-EPBX 40000 UNIT/ML IJ SOLN
40000.0000 [IU] | Freq: Once | INTRAMUSCULAR | Status: AC
  Administered 2019-08-01: 40000 [IU] via SUBCUTANEOUS

## 2019-08-01 MED ORDER — EPOETIN ALFA-EPBX 40000 UNIT/ML IJ SOLN
20000.0000 [IU] | Freq: Once | INTRAMUSCULAR | Status: DC
  Filled 2019-08-01: qty 1

## 2019-08-03 DIAGNOSIS — J441 Chronic obstructive pulmonary disease with (acute) exacerbation: Secondary | ICD-10-CM | POA: Diagnosis not present

## 2019-08-11 DIAGNOSIS — E1165 Type 2 diabetes mellitus with hyperglycemia: Secondary | ICD-10-CM | POA: Diagnosis not present

## 2019-08-14 ENCOUNTER — Inpatient Hospital Stay: Payer: Medicare HMO | Attending: Oncology

## 2019-08-14 ENCOUNTER — Other Ambulatory Visit: Payer: Self-pay

## 2019-08-14 DIAGNOSIS — N183 Chronic kidney disease, stage 3 unspecified: Secondary | ICD-10-CM | POA: Diagnosis not present

## 2019-08-14 DIAGNOSIS — Z87891 Personal history of nicotine dependence: Secondary | ICD-10-CM | POA: Diagnosis not present

## 2019-08-14 DIAGNOSIS — D631 Anemia in chronic kidney disease: Secondary | ICD-10-CM | POA: Diagnosis not present

## 2019-08-14 DIAGNOSIS — I10 Essential (primary) hypertension: Secondary | ICD-10-CM | POA: Insufficient documentation

## 2019-08-14 DIAGNOSIS — R0602 Shortness of breath: Secondary | ICD-10-CM | POA: Diagnosis not present

## 2019-08-14 DIAGNOSIS — J449 Chronic obstructive pulmonary disease, unspecified: Secondary | ICD-10-CM | POA: Diagnosis not present

## 2019-08-14 DIAGNOSIS — E785 Hyperlipidemia, unspecified: Secondary | ICD-10-CM | POA: Diagnosis not present

## 2019-08-14 DIAGNOSIS — M129 Arthropathy, unspecified: Secondary | ICD-10-CM | POA: Insufficient documentation

## 2019-08-14 DIAGNOSIS — N184 Chronic kidney disease, stage 4 (severe): Secondary | ICD-10-CM

## 2019-08-14 DIAGNOSIS — I509 Heart failure, unspecified: Secondary | ICD-10-CM | POA: Diagnosis not present

## 2019-08-14 DIAGNOSIS — Z79899 Other long term (current) drug therapy: Secondary | ICD-10-CM | POA: Insufficient documentation

## 2019-08-14 DIAGNOSIS — I129 Hypertensive chronic kidney disease with stage 1 through stage 4 chronic kidney disease, or unspecified chronic kidney disease: Secondary | ICD-10-CM | POA: Diagnosis not present

## 2019-08-14 LAB — COMPREHENSIVE METABOLIC PANEL
ALT: 8 U/L (ref 0–44)
AST: 10 U/L — ABNORMAL LOW (ref 15–41)
Albumin: 3.7 g/dL (ref 3.5–5.0)
Alkaline Phosphatase: 59 U/L (ref 38–126)
Anion gap: 5 (ref 5–15)
BUN: 46 mg/dL — ABNORMAL HIGH (ref 8–23)
CO2: 26 mmol/L (ref 22–32)
Calcium: 8.2 mg/dL — ABNORMAL LOW (ref 8.9–10.3)
Chloride: 107 mmol/L (ref 98–111)
Creatinine, Ser: 1.98 mg/dL — ABNORMAL HIGH (ref 0.61–1.24)
GFR calc Af Amer: 35 mL/min — ABNORMAL LOW (ref >60)
GFR calc non Af Amer: 30 mL/min — ABNORMAL LOW (ref >60)
Glucose, Bld: 154 mg/dL — ABNORMAL HIGH (ref 70–99)
Potassium: 4.9 mmol/L (ref 3.5–5.1)
Sodium: 138 mmol/L (ref 135–145)
Total Bilirubin: 0.6 mg/dL (ref 0.3–1.2)
Total Protein: 7.4 g/dL (ref 6.5–8.1)

## 2019-08-14 LAB — CBC WITH DIFFERENTIAL/PLATELET
Abs Immature Granulocytes: 0.03 10*3/uL (ref 0.00–0.07)
Basophils Absolute: 0.1 10*3/uL (ref 0.0–0.1)
Basophils Relative: 1 %
Eosinophils Absolute: 0.4 10*3/uL (ref 0.0–0.5)
Eosinophils Relative: 6 %
HCT: 26.4 % — ABNORMAL LOW (ref 39.0–52.0)
Hemoglobin: 8.4 g/dL — ABNORMAL LOW (ref 13.0–17.0)
Immature Granulocytes: 1 %
Lymphocytes Relative: 24 %
Lymphs Abs: 1.5 10*3/uL (ref 0.7–4.0)
MCH: 29.8 pg (ref 26.0–34.0)
MCHC: 31.8 g/dL (ref 30.0–36.0)
MCV: 93.6 fL (ref 80.0–100.0)
Monocytes Absolute: 0.7 10*3/uL (ref 0.1–1.0)
Monocytes Relative: 11 %
Neutro Abs: 3.5 10*3/uL (ref 1.7–7.7)
Neutrophils Relative %: 57 %
Platelets: 240 10*3/uL (ref 150–400)
RBC: 2.82 MIL/uL — ABNORMAL LOW (ref 4.22–5.81)
RDW: 19.9 % — ABNORMAL HIGH (ref 11.5–15.5)
WBC: 6.2 10*3/uL (ref 4.0–10.5)
nRBC: 0.3 % — ABNORMAL HIGH (ref 0.0–0.2)

## 2019-08-14 LAB — SAMPLE TO BLOOD BANK

## 2019-08-14 LAB — PATHOLOGIST SMEAR REVIEW

## 2019-08-15 ENCOUNTER — Encounter: Payer: Self-pay | Admitting: Oncology

## 2019-08-15 ENCOUNTER — Other Ambulatory Visit: Payer: Medicare HMO

## 2019-08-15 ENCOUNTER — Inpatient Hospital Stay: Payer: Medicare HMO

## 2019-08-15 ENCOUNTER — Inpatient Hospital Stay (HOSPITAL_BASED_OUTPATIENT_CLINIC_OR_DEPARTMENT_OTHER): Payer: Medicare HMO | Admitting: Oncology

## 2019-08-15 VITALS — BP 136/67 | HR 65 | Temp 98.3°F | Resp 16 | Wt 195.5 lb

## 2019-08-15 DIAGNOSIS — D631 Anemia in chronic kidney disease: Secondary | ICD-10-CM

## 2019-08-15 DIAGNOSIS — N189 Chronic kidney disease, unspecified: Secondary | ICD-10-CM

## 2019-08-15 DIAGNOSIS — E785 Hyperlipidemia, unspecified: Secondary | ICD-10-CM | POA: Diagnosis not present

## 2019-08-15 DIAGNOSIS — I129 Hypertensive chronic kidney disease with stage 1 through stage 4 chronic kidney disease, or unspecified chronic kidney disease: Secondary | ICD-10-CM | POA: Diagnosis not present

## 2019-08-15 DIAGNOSIS — J449 Chronic obstructive pulmonary disease, unspecified: Secondary | ICD-10-CM | POA: Diagnosis not present

## 2019-08-15 DIAGNOSIS — I509 Heart failure, unspecified: Secondary | ICD-10-CM | POA: Diagnosis not present

## 2019-08-15 DIAGNOSIS — N183 Chronic kidney disease, stage 3 unspecified: Secondary | ICD-10-CM | POA: Diagnosis not present

## 2019-08-15 DIAGNOSIS — M129 Arthropathy, unspecified: Secondary | ICD-10-CM | POA: Diagnosis not present

## 2019-08-15 DIAGNOSIS — R0602 Shortness of breath: Secondary | ICD-10-CM | POA: Diagnosis not present

## 2019-08-15 DIAGNOSIS — Z79899 Other long term (current) drug therapy: Secondary | ICD-10-CM | POA: Diagnosis not present

## 2019-08-15 MED ORDER — EPOETIN ALFA-EPBX 10000 UNIT/ML IJ SOLN
20000.0000 [IU] | Freq: Once | INTRAMUSCULAR | Status: AC
  Administered 2019-08-15: 20000 [IU] via SUBCUTANEOUS
  Filled 2019-08-15: qty 2

## 2019-08-15 MED ORDER — EPOETIN ALFA-EPBX 40000 UNIT/ML IJ SOLN
40000.0000 [IU] | Freq: Once | INTRAMUSCULAR | Status: AC
  Administered 2019-08-15: 40000 [IU] via SUBCUTANEOUS
  Filled 2019-08-15: qty 1

## 2019-08-15 MED ORDER — EPOETIN ALFA-EPBX 40000 UNIT/ML IJ SOLN: 60000.0000 [IU] | Freq: Once | INTRAMUSCULAR | Status: DC

## 2019-08-15 NOTE — Progress Notes (Signed)
Patient denies new problems/concerns today.   °

## 2019-08-15 NOTE — Progress Notes (Signed)
Hematology/Oncology  [facility] [phone] [phone]   Patient Care Team: Baxter Hire, MD as PCP - General (Internal Medicine) Earlie Server, MD as Consulting Physician (Oncology)  REFERRING PROVIDER: Baxter Hire, MD  CHIEF COMPLAINTS/REASON FOR VISIT:  Follow-up for anemia secondary to chronic kidney disease.  Possible MDS   INTERVAL HISTORY Arthur [name] is a 86 y.o. male who has above history reviewed by me today presents for follow up visit for management of anemia secondary to chronic kidney disease, possible underlying MDS Problems and complaints are listed below: Patient was accompanied by his daughter.  Patient received 1 unit of PRBC transfusion 4 weeks ago.  Has been on erythropoietin replacement therapy monthly.  Today he feels better.  No new complaints.   Review of Systems  Constitutional: Positive for fatigue. Negative for appetite change, chills, fever and unexpected weight change.  HENT:   Negative for hearing loss and voice change.   Eyes: Negative for eye problems and icterus.  Respiratory: Negative for chest tightness, cough and shortness of breath.   Cardiovascular: Negative for chest pain and leg swelling.  Gastrointestinal: Negative for abdominal distention and abdominal pain.  Endocrine: Negative for hot flashes.  Genitourinary: Negative for difficulty urinating, dysuria and frequency.   Musculoskeletal: Negative for arthralgias.       Chronic back pain  Skin: Negative for itching and rash.  Neurological: Negative for light-headedness and numbness.  Hematological: Negative for adenopathy. Does not bruise/bleed easily.  Psychiatric/Behavioral: Negative for confusion.    MEDICAL HISTORY:  Past Medical History:  Diagnosis Date  . Anemia of chronic kidney failure 08/21/2018  . Arthritis    lower back  . CHF (congestive heart failure) ([place])   . CKD (chronic kidney disease), stage III   . COPD (chronic  obstructive pulmonary disease) ([place])   . Diabetes mellitus without complication ([place])    type 2  . Dyspnea    when active -wears oxygen 2L via [place]  . History of blood transfusion   . Hyperlipidemia   . Hypertension   . Requires continuous at home supplemental oxygen    2L via     SURGICAL HISTORY: Past Surgical History:  Procedure Laterality Date  . COLONOSCOPY WITH PROPOFOL N/A 06/11/2018   Procedure: COLONOSCOPY WITH PROPOFOL;  Surgeon: Jonathon Bellows, MD;  Location: [facility] ENDOSCOPY;  Service: Gastroenterology;  Laterality: N/A;  . COLONOSCOPY WITH PROPOFOL N/A 06/13/2018   Procedure: COLONOSCOPY WITH PROPOFOL;  Surgeon: Jonathon Bellows, MD;  Location: [facility] ENDOSCOPY;  Service: Gastroenterology;  Laterality: N/A;  . ESOPHAGOGASTRODUODENOSCOPY Left 06/10/2018   Procedure: ESOPHAGOGASTRODUODENOSCOPY (EGD);  Surgeon: Jonathon Bellows, MD;  Location: [facility] ENDOSCOPY;  Service: Gastroenterology;  Laterality: Left;  . EYE SURGERY     removed cataracts  . GIVENS CAPSULE STUDY N/A 06/28/2018   Procedure: GIVENS CAPSULE STUDY;  Surgeon: Lucilla Lame, MD;  Location: [facility] ENDOSCOPY;  Service: Endoscopy;  Laterality: N/A;  . GIVENS CAPSULE STUDY N/A 06/30/2018   Procedure: GIVENS CAPSULE STUDY;  Surgeon: Jonathon Bellows, MD;  Location: [facility] ENDOSCOPY;  Service: Gastroenterology;  Laterality: N/A;  . KYPHOPLASTY N/A 10/31/2018   Procedure: KYPHOPLASTY L2;  Surgeon: [name], MD;  Location: [facility] ORS;  Service: Orthopedics;  Laterality: N/A;  . KYPHOPLASTY N/A 12/24/2018   Procedure: KYPHOPLASTY L3;  Surgeon: [name], MD;  Location: [facility] ORS;  Service: Orthopedics;  Laterality: N/A;  . KYPHOPLASTY N/A 02/18/2019   Procedure: L2 KYPHOPLASTY;  Surgeon: [name], MD;  Location: [facility] ORS;  Service:  Orthopedics;  Laterality: N/A;  . RADIOLOGY WITH ANESTHESIA N/A 01/28/2019   Procedure: MRI LUMBER SPINE WITHOUT CONTRAST;  Surgeon: Radiologist, Medication, MD;  Location: [facility];  Service: Radiology;  Laterality:  N/A;    SOCIAL HISTORY: Social History   Socioeconomic History  . Marital status: Widowed    Spouse name: Not on file  . Number of children: Not on file  . Years of education: Not on file  . Highest education level: Not on file  Occupational History  . Not on file  Tobacco Use  . Smoking status: Former Smoker    Packs/day: 2.00    Years: 35.00    Pack years: 70.00    Types: Cigarettes    Quit date: 11/04/1982    Years since quitting: 36.8  . Smokeless tobacco: Current User    Types: Chew  Vaping Use  . Vaping Use: Never used  Substance and Sexual Activity  . Alcohol use: Not Currently  . Drug use: No  . Sexual activity: Not Currently  Other Topics Concern  . Not on file  Social History Narrative  . Not on file   Social Determinants of Health   Financial Resource Strain:   . Difficulty of Paying Living Expenses:   Food Insecurity:   . Worried About Charity fundraiser in the Last Year:   . Arboriculturist in the Last Year:   Transportation Needs:   . Film/video editor (Medical):   Marland Kitchen Lack of Transportation (Non-Medical):   Physical Activity:   . Days of Exercise per Week:   . Minutes of Exercise per Session:   Stress:   . Feeling of Stress :   Social Connections:   . Frequency of Communication with Friends and Family:   . Frequency of Social Gatherings with Friends and Family:   . Attends Religious Services:   . Active Member of Clubs or Organizations:   . Attends Archivist Meetings:   Marland Kitchen Marital Status:   Intimate Partner Violence:   . Fear of Current or Ex-Partner:   . Emotionally Abused:   Marland Kitchen Physically Abused:   . Sexually Abused:     FAMILY HISTORY: Family History  Problem Relation Age of Onset  . COPD Mother   . Cancer Father     ALLERGIES:  is allergic to codeine.  MEDICATIONS:  Current Outpatient Medications  Medication Sig Dispense Refill  . acetaminophen (TYLENOL) 325 MG tablet Take 2 tablets (650 mg total) by mouth  every 6 (six) hours as needed for mild pain (or Fever >/= 101).    Marland Kitchen albuterol (VENTOLIN HFA) 108 (90 Base) MCG/ACT inhaler Inhale 2 puffs into the lungs every 6 (six) hours as needed for wheezing or shortness of breath. 1 Inhaler 2  . amLODipine (NORVASC) 2.5 MG tablet Take 2.5 mg by mouth daily.    Marland Kitchen ascorbic acid (VITAMIN C) 500 MG tablet Take 500 mg by mouth at bedtime.    . docusate sodium (COLACE) 100 MG capsule Take 1 capsule (100 mg total) by mouth 2 (two) times daily. 60 capsule 0  . insulin aspart (NOVOLOG) 100 UNIT/ML injection 4 units prior to meals if sugars above 200 10 mL 11  . LEVEMIR 100 UNIT/ML injection Inject 0.04 mLs (4 Units total) into the skin at bedtime. 10 mL 0  . metoCLOPramide (REGLAN) 5 MG tablet Take 5 mg by mouth 4 (four) times daily -  before meals and at bedtime.    . pantoprazole (  PROTONIX) 40 MG tablet Take 1 tablet (40 mg total) by mouth daily.    . polyethylene glycol powder (GLYCOLAX/MIRALAX) 17 GM/SCOOP powder Take 17 g by mouth every other day.     . potassium chloride (KLOR-CON) 10 MEQ tablet Take 10 mEq by mouth every evening.    . pravastatin (PRAVACHOL) 10 MG tablet Take 10 mg by mouth at bedtime.    . promethazine (PHENERGAN) 25 MG tablet Take 25 mg by mouth every 4 (four) hours as needed for nausea or vomiting.    . tamsulosin (FLOMAX) 0.4 MG CAPS capsule Take 0.4 mg by mouth daily.    Marland Kitchen tiotropium (SPIRIVA) 18 MCG inhalation capsule Place 18 mcg into inhaler and inhale daily.    Marland Kitchen torsemide (DEMADEX) 20 MG tablet Take 20 mg by mouth every Monday, Wednesday, and Friday.    . TRUE METRIX BLOOD GLUCOSE TEST test strip 1 each by Other route every morning.    Marland Kitchen FERREX 150 150 MG capsule  (Patient not taking: Reported on 08/15/2019)    . HYDROcodone-acetaminophen (NORCO) 5-325 MG tablet Take 1 tablet by mouth every 6 (six) hours as needed for moderate pain or severe pain. (Patient not taking: Reported on 08/15/2019) 9 tablet 0   No current  facility-administered medications for this visit.   Facility-Administered Medications Ordered in Other Visits  Medication Dose Route Frequency Provider Last Rate Last Admin  . epoetin alfa-epbx (RETACRIT) injection 40,000 Units  40,000 Units Subcutaneous Once Earlie Server, MD       And  . epoetin alfa-epbx (RETACRIT) injection 20,000 Units  20,000 Units Subcutaneous Once Earlie Server, MD         PHYSICAL EXAMINATION: ECOG PERFORMANCE STATUS: 3 - Symptomatic, >50% confined to bed Vitals:   08/15/19 0929  BP: 136/67  Pulse: 65  Resp: 16  Temp: 98.3 F (36.8 C)   Filed Weights   08/15/19 0929  Weight: 195 lb 8 oz (88.7 kg)    Physical Exam Constitutional:      General: He is not in acute distress.    Appearance: He is ill-appearing.     Comments: Sits in wheel chair  HENT:     Head: Normocephalic and atraumatic.  Eyes:     General: No scleral icterus.    Pupils: Pupils are equal, round, and reactive to light.  Cardiovascular:     Rate and Rhythm: Normal rate and regular rhythm.     Heart sounds: Normal heart sounds.  Pulmonary:     Effort: Pulmonary effort is normal. No respiratory distress.     Breath sounds: No wheezing.     Comments:  Decreased breath sounds at right base Abdominal:     General: Bowel sounds are normal. There is no distension.     Palpations: Abdomen is soft. There is no mass.     Tenderness: There is no abdominal tenderness.  Musculoskeletal:        General: No deformity. Normal range of motion.     Cervical back: Normal range of motion and neck supple.     Comments: Chronic bilateral lower extremity edema.   Skin:    General: Skin is warm and dry.     Coloration: Skin is not pale.     Findings: No erythema or rash.  Neurological:     Mental Status: He is alert. Mental status is at baseline.     Cranial Nerves: No cranial nerve deficit.     Coordination: Coordination normal.  Psychiatric:  Mood and Affect: Mood normal.     LABORATORY  DATA:  I have reviewed the data as listed Lab Results  Component Value Date   WBC 6.2 08/14/2019   HGB 8.4 (L) 08/14/2019   HCT 26.4 (L) 08/14/2019   MCV 93.6 08/14/2019   PLT 240 08/14/2019   Recent Labs    01/23/19 0826 01/28/19 0828 03/04/19 1220 03/05/19 0820 03/10/19 0538 07/23/19 1535 08/14/19 1528  NA  --    < > 133*   < > 130* 134* 138  K  --    < > 3.3*   < > 3.9 4.0 4.9  CL  --    < > 95*   < > 93* 99 107  CO2  --    < > 28   < > 32 25 26  GLUCOSE  --    < > 161*   < > 116* 143* 154*  BUN  --    < > 18   < > 27* 41* 46*  CREATININE  --    < > 1.54*   < > 1.59* 2.22* 1.98*  CALCIUM  --    < > 8.3*   < > 8.3* 8.4* 8.2*  GFRNONAA  --    < > 41*   < > 40* 26* 30*  GFRAA  --    < > 48*   < > 46* 30* 35*  PROT 7.8   < > 6.9  --   --  7.7 7.4  ALBUMIN 3.2*   < > 3.3*  --   --  3.7 3.7  AST 13*   < > 9*  --   --  9* 10*  ALT 12   < > 8  --   --  8 8  ALKPHOS 77   < > 73  --   --  65 59  BILITOT 0.6   < > 0.8  --   --  0.6 0.6  BILIDIR <0.1  --   --   --   --   --   --   IBILI NOT CALCULATED  --   --   --   --   --   --    < > = values in this interval not displayed.   Iron/TIBC/Ferritin/ %Sat    Component Value Date/Time   IRON 57 07/23/2019 1535   IRON 36 (L) 07/15/2018 1544   TIBC 168 (L) 07/23/2019 1535   TIBC 172 (L) 07/15/2018 1544   FERRITIN 914 (H) 07/23/2019 1535   FERRITIN 535 (H) 07/15/2018 1544   IRONPCTSAT 34 07/23/2019 1535   IRONPCTSAT 21 07/15/2018 1544      RADIOGRAPHIC STUDIES: I have personally reviewed the radiological images as listed and agreed with the findings in the report. No results found.    ASSESSMENT & PLAN:  1. Anemia of chronic renal failure, unspecified CKD stage     #Anemia of chronic kidney disease,  Suspect MDS cannot be ruled out.  Patient declines bone marrow biopsy due to multiple other medical problems. Labs are reviewed and discussed with patient.  Hemoglobin is 8.4, improved. Continue erythropoietin  replacement therapy 60,000 units monthly.  Proceed with 1 dose today. Supportive care for possible underlying MDS. H&H monthly +/- Retacrit, follow-up in 3 months lab MD +/- Retacrit, All questions were answered. The patient knows to call the clinic with any problems questions or concerns.   Earlie Server, MD, PhD Hematology Oncology [facility]- [phone] 08/15/2019

## 2019-08-20 DIAGNOSIS — J9611 Chronic respiratory failure with hypoxia: Secondary | ICD-10-CM | POA: Diagnosis not present

## 2019-08-20 DIAGNOSIS — J431 Panlobular emphysema: Secondary | ICD-10-CM | POA: Diagnosis not present

## 2019-08-20 DIAGNOSIS — E7800 Pure hypercholesterolemia, unspecified: Secondary | ICD-10-CM | POA: Diagnosis not present

## 2019-08-20 DIAGNOSIS — E1122 Type 2 diabetes mellitus with diabetic chronic kidney disease: Secondary | ICD-10-CM | POA: Diagnosis not present

## 2019-08-20 DIAGNOSIS — E1165 Type 2 diabetes mellitus with hyperglycemia: Secondary | ICD-10-CM | POA: Diagnosis not present

## 2019-08-20 DIAGNOSIS — N1832 Chronic kidney disease, stage 3b: Secondary | ICD-10-CM | POA: Diagnosis not present

## 2019-08-20 DIAGNOSIS — Z794 Long term (current) use of insulin: Secondary | ICD-10-CM | POA: Diagnosis not present

## 2019-08-27 DIAGNOSIS — L309 Dermatitis, unspecified: Secondary | ICD-10-CM | POA: Diagnosis not present

## 2019-08-27 DIAGNOSIS — B351 Tinea unguium: Secondary | ICD-10-CM | POA: Diagnosis not present

## 2019-08-27 DIAGNOSIS — E1142 Type 2 diabetes mellitus with diabetic polyneuropathy: Secondary | ICD-10-CM | POA: Diagnosis not present

## 2019-08-28 ENCOUNTER — Encounter: Payer: Self-pay | Admitting: Nurse Practitioner

## 2019-08-28 ENCOUNTER — Other Ambulatory Visit: Payer: Medicare HMO | Admitting: Nurse Practitioner

## 2019-08-28 ENCOUNTER — Other Ambulatory Visit: Payer: Self-pay

## 2019-08-28 DIAGNOSIS — J441 Chronic obstructive pulmonary disease with (acute) exacerbation: Secondary | ICD-10-CM | POA: Diagnosis not present

## 2019-08-28 DIAGNOSIS — Z515 Encounter for palliative care: Secondary | ICD-10-CM | POA: Diagnosis not present

## 2019-08-28 DIAGNOSIS — I509 Heart failure, unspecified: Secondary | ICD-10-CM | POA: Diagnosis not present

## 2019-08-28 NOTE — Progress Notes (Signed)
[place] Consult Note Telephone: [phone]  Fax: [phone]  PATIENT NAME: Arthur [name] DOB: 02-01-1933 MRN: [id number]  PRIMARY CARE PROVIDER:   Baxter Hire, MD  REFERRING PROVIDER:  Baxter Hire, MD [place],  [place] [address] RESPONSIBLE PARTY:[name] daughter [phone]  Due to the COVID-19 crisis, this visit was done via telemedicine from my office and it was initiated and consent by this patient and or family.  RECOMMENDATIONS and PLAN: 1.ACP: DNR  2.Dyspneic secondary toCOPD/CHF;remain stable at present time. ContinueO2, inhalers, inhalation therapy as needed.   3.Palliative care encounter Palliative medicine team will continue to support patient, patient's family, and medical team. Visit consisted of counseling and education dealing with the complex and emotionally intense issues of symptom management and palliative care in the setting of serious and potentially life-threatening illness  4. F/u 2 months for follow-up for chronic disease, symptom management as he continues to receive erythropoietin replacement  I spent 35 minutes providing this consultation,  from 3:45 pm to 4:15pm. More than 50% of the time in this consultation was spent coordinating communication.   HISTORY OF PRESENT ILLNESS:  Arthur [name] is a 86 y.o. year old male with multiple medical problems including COPD, congestive heart failure, anemia of chronic disease, chronic kidney disease stage 3, diabetes, hypertension, hyperlipidemia, history of GI bleed, allergies, tobacco use, obesity. Follow up palliative care visit for Mr Clear change to telemedicine telephonic as video not available due to increasing race of covid. [name] Mr Dorko daughter and I talked about purpose of palliative, in agreement. We talked about how [name] has been doing. [name] endorses Mr. Fauver is doing good. Mr. Seabrooks had a recent visit  with Dr. Wynetta Emery for Primary Care with hemoglobin A1c 6.6. Mr Kawahara also saw [name] Oncology on 08/15/2019 received erythropoietin replacement, supportive care for possible underlying MDS. We talked about no recent falls, wounds, hospitalizations come infections. We talked about Mr. Burkemper's functional level as he is ambulatory but most of his daily routine consists of sitting in the recliner and watching TV. We talked about other alternatives. [name] endorses she works during the day and that is what he chooses to do. [name] endorses she does have a friend that comes and checks on him at lunch time. Please send to her so she makes his lunch and hers that evening before so he gets up and eats when he is there. [name] endorses he has a good appetite. No noted weight loss. No other changes are concerns. Medical goals reviewed. At present [name] appear stable. We discussed follow-up visit in 2 months if needed with ongoing monitoring for decline, debility or sooner should he become symptomatic. Lisa in agreement. Appointment schedule. Therapeutic listening, emotional support provided. Contact information. Questions answered to satisfaction.  Palliative Care was asked to help to continue to address goals of care.   CODE STATUS: DNR  PPS: 50% HOSPICE ELIGIBILITY/DIAGNOSIS: TBD  PAST MEDICAL HISTORY:  Past Medical History:  Diagnosis Date  . Anemia of chronic kidney failure 08/21/2018  . Arthritis    lower back  . CHF (congestive heart failure) ([place])   . CKD (chronic kidney disease), stage III   . COPD (chronic obstructive pulmonary disease) ([place])   . Diabetes mellitus without complication ([place])    type 2  . Dyspnea    when active -wears oxygen 2L via [place]  . History of blood transfusion   . Hyperlipidemia   .  Hypertension   . Requires continuous at home supplemental oxygen    2L via [place]    SOCIAL HX:  Social History   Tobacco Use  . Smoking status: Former Smoker    Packs/day: 2.00    Years:  35.00    Pack years: 70.00    Types: Cigarettes    Quit date: 11/04/1982    Years since quitting: 36.8  . Smokeless tobacco: Current User    Types: Chew  Substance Use Topics  . Alcohol use: Not Currently    ALLERGIES:  Allergies  Allergen Reactions  . Codeine Other (See Comments)    Constipation     PERTINENT MEDICATIONS:  Outpatient Encounter Medications as of 08/28/2019  Medication Sig  . acetaminophen (TYLENOL) 325 MG tablet Take 2 tablets (650 mg total) by mouth every 6 (six) hours as needed for mild pain (or Fever >/= 101).  Marland Kitchen albuterol (VENTOLIN HFA) 108 (90 Base) MCG/ACT inhaler Inhale 2 puffs into the lungs every 6 (six) hours as needed for wheezing or shortness of breath.  Marland Kitchen amLODipine (NORVASC) 2.5 MG tablet Take 2.5 mg by mouth daily.  Marland Kitchen ascorbic acid (VITAMIN C) 500 MG tablet Take 500 mg by mouth at bedtime.  . docusate sodium (COLACE) 100 MG capsule Take 1 capsule (100 mg total) by mouth 2 (two) times daily.  Marland Kitchen FERREX 150 150 MG capsule  (Patient not taking: Reported on 08/15/2019)  . HYDROcodone-acetaminophen (NORCO) 5-325 MG tablet Take 1 tablet by mouth every 6 (six) hours as needed for moderate pain or severe pain. (Patient not taking: Reported on 08/15/2019)  . insulin aspart (NOVOLOG) 100 UNIT/ML injection 4 units prior to meals if sugars above 200  . LEVEMIR 100 UNIT/ML injection Inject 0.04 mLs (4 Units total) into the skin at bedtime.  . metoCLOPramide (REGLAN) 5 MG tablet Take 5 mg by mouth 4 (four) times daily -  before meals and at bedtime.  . pantoprazole (PROTONIX) 40 MG tablet Take 1 tablet (40 mg total) by mouth daily.  . polyethylene glycol powder (GLYCOLAX/MIRALAX) 17 GM/SCOOP powder Take 17 g by mouth every other day.   . potassium chloride (KLOR-CON) 10 MEQ tablet Take 10 mEq by mouth every evening.  . pravastatin (PRAVACHOL) 10 MG tablet Take 10 mg by mouth at bedtime.  . promethazine (PHENERGAN) 25 MG tablet Take 25 mg by mouth every 4 (four) hours as  needed for nausea or vomiting.  . tamsulosin (FLOMAX) 0.4 MG CAPS capsule Take 0.4 mg by mouth daily.  Marland Kitchen tiotropium (SPIRIVA) 18 MCG inhalation capsule Place 18 mcg into inhaler and inhale daily.  Marland Kitchen torsemide (DEMADEX) 20 MG tablet Take 20 mg by mouth every Monday, Wednesday, and Friday.  . TRUE METRIX BLOOD GLUCOSE TEST test strip 1 each by Other route every morning.   No facility-administered encounter medications on file as of 08/28/2019.    PHYSICAL EXAM:   Deferred   Peytin Dechert Z Elian Gloster, NP

## 2019-09-03 DIAGNOSIS — J441 Chronic obstructive pulmonary disease with (acute) exacerbation: Secondary | ICD-10-CM | POA: Diagnosis not present

## 2019-09-12 ENCOUNTER — Inpatient Hospital Stay: Payer: Medicare HMO

## 2019-09-12 ENCOUNTER — Other Ambulatory Visit: Payer: Self-pay

## 2019-09-12 ENCOUNTER — Inpatient Hospital Stay: Payer: Medicare HMO | Attending: Oncology

## 2019-09-12 VITALS — BP 124/52 | HR 80

## 2019-09-12 DIAGNOSIS — D631 Anemia in chronic kidney disease: Secondary | ICD-10-CM | POA: Diagnosis not present

## 2019-09-12 DIAGNOSIS — N183 Chronic kidney disease, stage 3 unspecified: Secondary | ICD-10-CM | POA: Insufficient documentation

## 2019-09-12 LAB — HEMOGLOBIN AND HEMATOCRIT, BLOOD
HCT: 26.3 % — ABNORMAL LOW (ref 39.0–52.0)
Hemoglobin: 8.5 g/dL — ABNORMAL LOW (ref 13.0–17.0)

## 2019-09-12 MED ORDER — EPOETIN ALFA-EPBX 40000 UNIT/ML IJ SOLN
40000.0000 [IU] | Freq: Once | INTRAMUSCULAR | Status: AC
  Administered 2019-09-12: 40000 [IU] via SUBCUTANEOUS
  Filled 2019-09-12: qty 1

## 2019-09-12 MED ORDER — EPOETIN ALFA-EPBX 10000 UNIT/ML IJ SOLN
20000.0000 [IU] | Freq: Once | INTRAMUSCULAR | Status: AC
  Administered 2019-09-12: 20000 [IU] via SUBCUTANEOUS
  Filled 2019-09-12: qty 2

## 2019-09-12 MED ORDER — EPOETIN ALFA-EPBX 40000 UNIT/ML IJ SOLN: 60000.0000 [IU] | Freq: Once | INTRAMUSCULAR | Status: DC

## 2019-09-15 DIAGNOSIS — N401 Enlarged prostate with lower urinary tract symptoms: Secondary | ICD-10-CM | POA: Diagnosis not present

## 2019-09-15 DIAGNOSIS — N399 Disorder of urinary system, unspecified: Secondary | ICD-10-CM | POA: Diagnosis not present

## 2019-09-28 DIAGNOSIS — J441 Chronic obstructive pulmonary disease with (acute) exacerbation: Secondary | ICD-10-CM | POA: Diagnosis not present

## 2019-10-03 DIAGNOSIS — J441 Chronic obstructive pulmonary disease with (acute) exacerbation: Secondary | ICD-10-CM | POA: Diagnosis not present

## 2019-10-10 ENCOUNTER — Inpatient Hospital Stay: Payer: Medicare HMO

## 2019-10-10 ENCOUNTER — Other Ambulatory Visit: Payer: Self-pay

## 2019-10-10 ENCOUNTER — Inpatient Hospital Stay: Payer: Medicare HMO | Attending: Oncology

## 2019-10-10 VITALS — BP 146/65 | HR 80

## 2019-10-10 DIAGNOSIS — N183 Chronic kidney disease, stage 3 unspecified: Secondary | ICD-10-CM | POA: Insufficient documentation

## 2019-10-10 DIAGNOSIS — D631 Anemia in chronic kidney disease: Secondary | ICD-10-CM

## 2019-10-10 DIAGNOSIS — I129 Hypertensive chronic kidney disease with stage 1 through stage 4 chronic kidney disease, or unspecified chronic kidney disease: Secondary | ICD-10-CM | POA: Diagnosis not present

## 2019-10-10 DIAGNOSIS — Z79899 Other long term (current) drug therapy: Secondary | ICD-10-CM | POA: Diagnosis not present

## 2019-10-10 DIAGNOSIS — N189 Chronic kidney disease, unspecified: Secondary | ICD-10-CM

## 2019-10-10 LAB — HEMOGLOBIN AND HEMATOCRIT, BLOOD
HCT: 26.1 % — ABNORMAL LOW (ref 39.0–52.0)
Hemoglobin: 8.1 g/dL — ABNORMAL LOW (ref 13.0–17.0)

## 2019-10-10 MED ORDER — EPOETIN ALFA-EPBX 40000 UNIT/ML IJ SOLN
40000.0000 [IU] | Freq: Once | INTRAMUSCULAR | Status: AC
  Administered 2019-10-10: 40000 [IU] via SUBCUTANEOUS

## 2019-10-10 MED ORDER — EPOETIN ALFA-EPBX 40000 UNIT/ML IJ SOLN
40000.0000 [IU] | Freq: Once | INTRAMUSCULAR | Status: DC
  Filled 2019-10-10: qty 1

## 2019-10-10 MED ORDER — EPOETIN ALFA-EPBX 40000 UNIT/ML IJ SOLN: 60000.0000 [IU] | Freq: Once | INTRAMUSCULAR | Status: DC

## 2019-10-10 MED ORDER — EPOETIN ALFA-EPBX 40000 UNIT/ML IJ SOLN
20000.0000 [IU] | Freq: Once | INTRAMUSCULAR | Status: DC
  Filled 2019-10-10: qty 1

## 2019-10-10 MED ORDER — EPOETIN ALFA-EPBX 10000 UNIT/ML IJ SOLN
20000.0000 [IU] | Freq: Once | INTRAMUSCULAR | Status: AC
  Administered 2019-10-10: 20000 [IU] via SUBCUTANEOUS

## 2019-10-28 ENCOUNTER — Other Ambulatory Visit: Payer: Self-pay

## 2019-10-28 ENCOUNTER — Telehealth: Payer: Self-pay | Admitting: Nurse Practitioner

## 2019-10-28 ENCOUNTER — Other Ambulatory Visit: Payer: Medicare HMO | Admitting: Nurse Practitioner

## 2019-10-28 DIAGNOSIS — J441 Chronic obstructive pulmonary disease with (acute) exacerbation: Secondary | ICD-10-CM | POA: Diagnosis not present

## 2019-10-28 NOTE — Telephone Encounter (Signed)
I called Arthur [name] daughter to confirm f/u in person PC visit, [name] endorses nothing has changed, she is still at school, having bus trouble, wishes to reschedule. Rescheduled per requrest

## 2019-11-03 DIAGNOSIS — J441 Chronic obstructive pulmonary disease with (acute) exacerbation: Secondary | ICD-10-CM | POA: Diagnosis not present

## 2019-11-07 ENCOUNTER — Inpatient Hospital Stay: Payer: Medicare HMO

## 2019-11-07 ENCOUNTER — Other Ambulatory Visit: Payer: Self-pay

## 2019-11-07 ENCOUNTER — Inpatient Hospital Stay: Payer: Medicare HMO | Attending: Oncology | Admitting: Oncology

## 2019-11-07 ENCOUNTER — Encounter: Payer: Self-pay | Admitting: Oncology

## 2019-11-07 VITALS — BP 129/56 | HR 75 | Temp 98.3°F | Wt 181.8 lb

## 2019-11-07 DIAGNOSIS — N183 Chronic kidney disease, stage 3 unspecified: Secondary | ICD-10-CM | POA: Insufficient documentation

## 2019-11-07 DIAGNOSIS — M129 Arthropathy, unspecified: Secondary | ICD-10-CM | POA: Diagnosis not present

## 2019-11-07 DIAGNOSIS — Z23 Encounter for immunization: Secondary | ICD-10-CM | POA: Insufficient documentation

## 2019-11-07 DIAGNOSIS — I129 Hypertensive chronic kidney disease with stage 1 through stage 4 chronic kidney disease, or unspecified chronic kidney disease: Secondary | ICD-10-CM | POA: Diagnosis not present

## 2019-11-07 DIAGNOSIS — N189 Chronic kidney disease, unspecified: Secondary | ICD-10-CM | POA: Diagnosis not present

## 2019-11-07 DIAGNOSIS — Z79899 Other long term (current) drug therapy: Secondary | ICD-10-CM | POA: Insufficient documentation

## 2019-11-07 DIAGNOSIS — E785 Hyperlipidemia, unspecified: Secondary | ICD-10-CM | POA: Insufficient documentation

## 2019-11-07 DIAGNOSIS — Z87891 Personal history of nicotine dependence: Secondary | ICD-10-CM | POA: Insufficient documentation

## 2019-11-07 DIAGNOSIS — D631 Anemia in chronic kidney disease: Secondary | ICD-10-CM

## 2019-11-07 DIAGNOSIS — J449 Chronic obstructive pulmonary disease, unspecified: Secondary | ICD-10-CM | POA: Diagnosis not present

## 2019-11-07 DIAGNOSIS — I509 Heart failure, unspecified: Secondary | ICD-10-CM | POA: Diagnosis not present

## 2019-11-07 LAB — CBC WITH DIFFERENTIAL/PLATELET
Abs Immature Granulocytes: 0.02 10*3/uL (ref 0.00–0.07)
Basophils Absolute: 0.1 10*3/uL (ref 0.0–0.1)
Basophils Relative: 1 %
Eosinophils Absolute: 0.4 10*3/uL (ref 0.0–0.5)
Eosinophils Relative: 6 %
HCT: 25.6 % — ABNORMAL LOW (ref 39.0–52.0)
Hemoglobin: 8 g/dL — ABNORMAL LOW (ref 13.0–17.0)
Immature Granulocytes: 0 %
Lymphocytes Relative: 27 %
Lymphs Abs: 1.8 10*3/uL (ref 0.7–4.0)
MCH: 29.3 pg (ref 26.0–34.0)
MCHC: 31.3 g/dL (ref 30.0–36.0)
MCV: 93.8 fL (ref 80.0–100.0)
Monocytes Absolute: 0.7 10*3/uL (ref 0.1–1.0)
Monocytes Relative: 10 %
Neutro Abs: 3.5 10*3/uL (ref 1.7–7.7)
Neutrophils Relative %: 56 %
Platelets: 211 10*3/uL (ref 150–400)
RBC: 2.73 MIL/uL — ABNORMAL LOW (ref 4.22–5.81)
RDW: 18.9 % — ABNORMAL HIGH (ref 11.5–15.5)
WBC: 6.4 10*3/uL (ref 4.0–10.5)
nRBC: 0 % (ref 0.0–0.2)

## 2019-11-07 MED ORDER — EPOETIN ALFA-EPBX 40000 UNIT/ML IJ SOLN: 60000.0000 [IU] | Freq: Once | INTRAMUSCULAR | Status: DC

## 2019-11-07 MED ORDER — EPOETIN ALFA-EPBX 40000 UNIT/ML IJ SOLN
40000.0000 [IU] | Freq: Once | INTRAMUSCULAR | Status: AC
  Administered 2019-11-07: 40000 [IU] via SUBCUTANEOUS
  Filled 2019-11-07: qty 1

## 2019-11-07 MED ORDER — EPOETIN ALFA-EPBX 10000 UNIT/ML IJ SOLN
20000.0000 [IU] | Freq: Once | INTRAMUSCULAR | Status: AC
  Administered 2019-11-07: 20000 [IU] via SUBCUTANEOUS
  Filled 2019-11-07: qty 2

## 2019-11-07 MED ORDER — INFLUENZA VAC A&B SA ADJ QUAD 0.5 ML IM PRSY
0.5000 mL | PREFILLED_SYRINGE | Freq: Once | INTRAMUSCULAR | Status: AC
  Administered 2019-11-07: 0.5 mL via INTRAMUSCULAR
  Filled 2019-11-07: qty 0.5

## 2019-11-07 NOTE — Progress Notes (Signed)
Patient has been feeling weak.

## 2019-11-07 NOTE — Progress Notes (Signed)
Hematology/Oncology  [facility] Telephone:([phone] [phone]   Patient Care Team: Baxter Hire, MD as PCP - General (Internal Medicine) Earlie Server, MD as Consulting Physician (Oncology)  REFERRING PROVIDER: Baxter Hire, MD  CHIEF COMPLAINTS/REASON FOR VISIT:  Follow-up for anemia secondary to chronic kidney disease.  Possible MDS   INTERVAL HISTORY Arthur [name] is a 86 y.o. male who has above history reviewed by me today presents for follow up visit for management of anemia secondary to chronic kidney disease, possible underlying MDS Problems and complaints are listed below: Accompanied by his daughter. Chronic fatigue, unchanged. He reports feeling ok. Back pain has improved.    Review of Systems  Constitutional: Positive for fatigue. Negative for appetite change, chills, fever and unexpected weight change.  HENT:   Negative for hearing loss and voice change.   Eyes: Negative for eye problems and icterus.  Respiratory: Negative for chest tightness, cough and shortness of breath.   Cardiovascular: Negative for chest pain and leg swelling.  Gastrointestinal: Negative for abdominal distention and abdominal pain.  Endocrine: Negative for hot flashes.  Genitourinary: Negative for difficulty urinating, dysuria and frequency.   Musculoskeletal: Negative for arthralgias.       Chronic back pain  Skin: Negative for itching and rash.  Neurological: Negative for light-headedness and numbness.  Hematological: Negative for adenopathy. Does not bruise/bleed easily.  Psychiatric/Behavioral: Negative for confusion.    MEDICAL HISTORY:  Past Medical History:  Diagnosis Date   Anemia of chronic kidney failure 08/21/2018   Arthritis    lower back   CHF (congestive heart failure) (HCC)    CKD (chronic kidney disease), stage III (HCC)    COPD (chronic obstructive pulmonary disease) (HCC)    Diabetes mellitus without complication (HCC)     type 2   Dyspnea    when active -wears oxygen 2L via [place]   History of blood transfusion    Hyperlipidemia    Hypertension    Requires continuous at home supplemental oxygen    2L via [place]    SURGICAL HISTORY: Past Surgical History:  Procedure Laterality Date   COLONOSCOPY WITH PROPOFOL N/A 06/11/2018   Procedure: COLONOSCOPY WITH PROPOFOL;  Surgeon: Jonathon Bellows, MD;  Location[facility] ENDOSCOPY;  Service: Gastroenterology;  Laterality: N/A;   COLONOSCOPY WITH PROPOFOL N/A 06/13/2018   Procedure: COLONOSCOPY WITH PROPOFOL;  Surgeon: Jonathon Bellows, MD;  Location: [facility] ENDOSCOPY;  Service: Gastroenterology;  Laterality: N/A;   ESOPHAGOGASTRODUODENOSCOPY Left 06/10/2018   Procedure: ESOPHAGOGASTRODUODENOSCOPY (EGD);  Surgeon: Jonathon Bellows, MD;  Location: [facility] ENDOSCOPY;  Service: Gastroenterology;  Laterality: Left;   EYE SURGERY     removed cataracts   GIVENS CAPSULE STUDY N/A 06/28/2018   Procedure: GIVENS CAPSULE STUDY;  Surgeon: Lucilla Lame, MD;  Location: [facility] ENDOSCOPY;  Service: Endoscopy;  Laterality: N/A;   GIVENS CAPSULE STUDY N/A 06/30/2018   Procedure: GIVENS CAPSULE STUDY;  Surgeon: Jonathon Bellows, MD;  Location: [facility] ENDOSCOPY;  Service: Gastroenterology;  Laterality: N/A;   KYPHOPLASTY N/A 10/31/2018   Procedure: KYPHOPLASTY L2;  Surgeon: [name], MD;  Location: [facility] ORS;  Service: Orthopedics;  Laterality: N/A;   KYPHOPLASTY N/A 12/24/2018   Procedure: KYPHOPLASTY L3;  Surgeon: [name], MD;  Location: [facility] ORS;  Service: Orthopedics;  Laterality: N/A;   KYPHOPLASTY N/A 02/18/2019   Procedure: L2 KYPHOPLASTY;  Surgeon: [name], MD;  Location: [facility] ORS;  Service: Orthopedics;  Laterality: N/A;   RADIOLOGY WITH ANESTHESIA N/A 01/28/2019   Procedure: MRI LUMBER SPINE  WITHOUT CONTRAST;  Surgeon: Radiologist, Medication, MD;  Location: [place];  Service: Radiology;  Laterality: N/A;    SOCIAL HISTORY: Social History   Socioeconomic History   Marital status:  Widowed    Spouse name: Not on file   Number of children: Not on file   Years of education: Not on file   Highest education level: Not on file  Occupational History   Not on file  Tobacco Use   Smoking status: Former Smoker    Packs/day: 2.00    Years: 35.00    Pack years: 70.00    Types: Cigarettes    Quit date: 11/04/1982    Years since quitting: 37.0   Smokeless tobacco: Current User    Types: Database administrator Use   Vaping Use: Never used  Substance and Sexual Activity   Alcohol use: Not Currently   Drug use: No   Sexual activity: Not Currently  Other Topics Concern   Not on file  Social History Narrative   Not on file   Social Determinants of Health   Financial Resource Strain:    Difficulty of Paying Living Expenses: Not on file  Food Insecurity:    Worried About Charity fundraiser in the Last Year: Not on file   [employer] of Food in the Last Year: Not on file  Transportation Needs:    Lack of Transportation (Medical): Not on file   Lack of Transportation (Non-Medical): Not on file  Physical Activity:    Days of Exercise per Week: Not on file   Minutes of Exercise per Session: Not on file  Stress:    Feeling of Stress : Not on file  Social Connections:    Frequency of Communication with Friends and Family: Not on file   Frequency of Social Gatherings with Friends and Family: Not on file   Attends Religious Services: Not on file   Active Member of Clubs or Organizations: Not on file   Attends Archivist Meetings: Not on file   Marital Status: Not on file  Intimate Partner Violence:    Fear of Current or Ex-Partner: Not on file   Emotionally Abused: Not on file   Physically Abused: Not on file   Sexually Abused: Not on file    FAMILY HISTORY: Family History  Problem Relation Age of Onset   COPD Mother    Cancer Father     ALLERGIES:  is allergic to codeine.  MEDICATIONS:  Current Outpatient Medications   Medication Sig Dispense Refill   acetaminophen (TYLENOL) 325 MG tablet Take 2 tablets (650 mg total) by mouth every 6 (six) hours as needed for mild pain (or Fever >/= 101).     albuterol (VENTOLIN HFA) 108 (90 Base) MCG/ACT inhaler Inhale 2 puffs into the lungs every 6 (six) hours as needed for wheezing or shortness of breath. 1 Inhaler 2   amLODipine (NORVASC) 2.5 MG tablet Take 2.5 mg by mouth daily.     ascorbic acid (VITAMIN C) 500 MG tablet Take 500 mg by mouth at bedtime.     docusate sodium (COLACE) 100 MG capsule Take 1 capsule (100 mg total) by mouth 2 (two) times daily. 60 capsule 0   insulin aspart (NOVOLOG) 100 UNIT/ML injection 4 units prior to meals if sugars above 200 10 mL 11   LEVEMIR 100 UNIT/ML injection Inject 0.04 mLs (4 Units total) into the skin at bedtime. 10 mL 0   metoCLOPramide (REGLAN) 5 MG tablet Take 5  mg by mouth 4 (four) times daily -  before meals and at bedtime.     pantoprazole (PROTONIX) 40 MG tablet Take 1 tablet (40 mg total) by mouth daily.     polyethylene glycol powder (GLYCOLAX/MIRALAX) 17 GM/SCOOP powder Take 17 g by mouth every other day.      potassium chloride (KLOR-CON) 10 MEQ tablet Take 10 mEq by mouth every evening.     pravastatin (PRAVACHOL) 10 MG tablet Take 10 mg by mouth at bedtime.     promethazine (PHENERGAN) 25 MG tablet Take 25 mg by mouth every 4 (four) hours as needed for nausea or vomiting.     tamsulosin (FLOMAX) 0.4 MG CAPS capsule Take 0.4 mg by mouth daily.     tiotropium (SPIRIVA) 18 MCG inhalation capsule Place 18 mcg into inhaler and inhale daily.     torsemide (DEMADEX) 20 MG tablet Take 20 mg by mouth every Monday, Wednesday, and Friday.     TRUE METRIX BLOOD GLUCOSE TEST test strip 1 each by Other route every morning.     FERREX 150 150 MG capsule  (Patient not taking: Reported on 08/15/2019)     HYDROcodone-acetaminophen (NORCO) 5-325 MG tablet Take 1 tablet by mouth every 6 (six) hours as needed for  moderate pain or severe pain. (Patient not taking: Reported on 08/15/2019) 9 tablet 0   No current facility-administered medications for this visit.     PHYSICAL EXAMINATION: ECOG PERFORMANCE STATUS: 3 - Symptomatic, >50% confined to bed Vitals:   11/07/19 1428  BP: (!) 129/56  Pulse: 75  Temp: 98.3 F (36.8 C)  SpO2: 100%   Filed Weights   11/07/19 1428  Weight: 181 lb 12.8 oz (82.5 kg)    Physical Exam Constitutional:      General: He is not in acute distress.    Appearance: He is ill-appearing.     Comments: Sits in wheel chair  HENT:     Head: Normocephalic and atraumatic.  Eyes:     General: No scleral icterus.    Pupils: Pupils are equal, round, and reactive to light.  Cardiovascular:     Rate and Rhythm: Normal rate and regular rhythm.     Heart sounds: Normal heart sounds.  Pulmonary:     Effort: Pulmonary effort is normal. No respiratory distress.     Breath sounds: No wheezing.     Comments:  Decreased breath sounds at right base Abdominal:     General: Bowel sounds are normal. There is no distension.     Palpations: Abdomen is soft. There is no mass.     Tenderness: There is no abdominal tenderness.  Musculoskeletal:        General: No deformity. Normal range of motion.     Cervical back: Normal range of motion and neck supple.     Comments: Chronic bilateral lower extremity edema.   Skin:    General: Skin is warm and dry.     Coloration: Skin is not pale.     Findings: No erythema or rash.  Neurological:     Mental Status: He is alert. Mental status is at baseline.     Cranial Nerves: No cranial nerve deficit.     Coordination: Coordination normal.  Psychiatric:        Mood and Affect: Mood normal.     LABORATORY DATA:  I have reviewed the data as listed Lab Results  Component Value Date   WBC 6.4 11/07/2019   HGB 8.0 (L) 11/07/2019  HCT 25.6 (L) 11/07/2019   MCV 93.8 11/07/2019   PLT 211 11/07/2019   Recent Labs    01/23/19 0826  01/28/19 0828 03/04/19 1220 03/05/19 0820 03/10/19 0538 07/23/19 1535 08/14/19 1528  NA  --    < > 133*   < > 130* 134* 138  K  --    < > 3.3*   < > 3.9 4.0 4.9  CL  --    < > 95*   < > 93* 99 107  CO2  --    < > 28   < > 32 25 26  GLUCOSE  --    < > 161*   < > 116* 143* 154*  BUN  --    < > 18   < > 27* 41* 46*  CREATININE  --    < > 1.54*   < > 1.59* 2.22* 1.98*  CALCIUM  --    < > 8.3*   < > 8.3* 8.4* 8.2*  GFRNONAA  --    < > 41*   < > 40* 26* 30*  GFRAA  --    < > 48*   < > 46* 30* 35*  PROT 7.8   < > 6.9  --   --  7.7 7.4  ALBUMIN 3.2*   < > 3.3*  --   --  3.7 3.7  AST 13*   < > 9*  --   --  9* 10*  ALT 12   < > 8  --   --  8 8  ALKPHOS 77   < > 73  --   --  65 59  BILITOT 0.6   < > 0.8  --   --  0.6 0.6  BILIDIR <0.1  --   --   --   --   --   --   IBILI NOT CALCULATED  --   --   --   --   --   --    < > = values in this interval not displayed.   Iron/TIBC/Ferritin/ %Sat    Component Value Date/Time   IRON 57 07/23/2019 1535   IRON 36 (L) 07/15/2018 1544   TIBC 168 (L) 07/23/2019 1535   TIBC 172 (L) 07/15/2018 1544   FERRITIN 914 (H) 07/23/2019 1535   FERRITIN 535 (H) 07/15/2018 1544   IRONPCTSAT 34 07/23/2019 1535   IRONPCTSAT 21 07/15/2018 1544      RADIOGRAPHIC STUDIES: I have personally reviewed the radiological images as listed and agreed with the findings in the report. No results found.    ASSESSMENT & PLAN:  1. Anemia of chronic renal failure, unspecified CKD stage     #Anemia of chronic kidney disease,  Suspect MDS cannot be ruled out.  Patient declines bone marrow biopsy due to multiple other medical problems. Hemoglobin is 8 today, stable.  Check retic panel.  Continue retacrit 60,000 units today.  Supportive care for possible underlying MDS. H&H monthly +/- Retacrit, follow-up in 3 months lab MD +/- Retacrit, All questions were answered. The patient knows to call the clinic with any problems questions or concerns.   Earlie Server, MD,  PhD Hematology Oncology [facility] Pager- [phone] 11/07/2019

## 2019-11-28 DIAGNOSIS — J441 Chronic obstructive pulmonary disease with (acute) exacerbation: Secondary | ICD-10-CM | POA: Diagnosis not present

## 2019-12-03 DIAGNOSIS — J441 Chronic obstructive pulmonary disease with (acute) exacerbation: Secondary | ICD-10-CM | POA: Diagnosis not present

## 2019-12-04 ENCOUNTER — Other Ambulatory Visit: Payer: Medicare HMO | Admitting: Nurse Practitioner

## 2019-12-04 ENCOUNTER — Other Ambulatory Visit: Payer: Self-pay

## 2019-12-05 ENCOUNTER — Inpatient Hospital Stay: Payer: Medicare HMO | Attending: Oncology

## 2019-12-05 ENCOUNTER — Inpatient Hospital Stay: Payer: Medicare HMO

## 2019-12-05 ENCOUNTER — Other Ambulatory Visit: Payer: Self-pay

## 2019-12-05 VITALS — BP 133/62 | HR 76

## 2019-12-05 DIAGNOSIS — D631 Anemia in chronic kidney disease: Secondary | ICD-10-CM

## 2019-12-05 DIAGNOSIS — N189 Chronic kidney disease, unspecified: Secondary | ICD-10-CM | POA: Insufficient documentation

## 2019-12-05 DIAGNOSIS — Z79899 Other long term (current) drug therapy: Secondary | ICD-10-CM | POA: Insufficient documentation

## 2019-12-05 DIAGNOSIS — I129 Hypertensive chronic kidney disease with stage 1 through stage 4 chronic kidney disease, or unspecified chronic kidney disease: Secondary | ICD-10-CM | POA: Insufficient documentation

## 2019-12-05 LAB — HEMOGLOBIN AND HEMATOCRIT, BLOOD
HCT: 24.2 % — ABNORMAL LOW (ref 39.0–52.0)
Hemoglobin: 7.5 g/dL — ABNORMAL LOW (ref 13.0–17.0)

## 2019-12-05 LAB — RETIC PANEL
Immature Retic Fract: 7.2 % (ref 2.3–15.9)
RBC.: 2.56 MIL/uL — ABNORMAL LOW (ref 4.22–5.81)
Retic Count, Absolute: 12.3 10*3/uL — ABNORMAL LOW (ref 19.0–186.0)
Retic Ct Pct: 0.5 % (ref 0.4–3.1)
Reticulocyte Hemoglobin: 24.7 pg — ABNORMAL LOW (ref >27.9)

## 2019-12-05 MED ORDER — EPOETIN ALFA-EPBX 40000 UNIT/ML IJ SOLN
20000.0000 [IU] | Freq: Once | INTRAMUSCULAR | Status: DC
  Filled 2019-12-05: qty 1

## 2019-12-05 MED ORDER — EPOETIN ALFA-EPBX 40000 UNIT/ML IJ SOLN
40000.0000 [IU] | Freq: Once | INTRAMUSCULAR | Status: AC
  Administered 2019-12-05: 40000 [IU] via SUBCUTANEOUS
  Filled 2019-12-05: qty 1

## 2019-12-05 MED ORDER — EPOETIN ALFA-EPBX 40000 UNIT/ML IJ SOLN: 60000.0000 [IU] | Freq: Once | INTRAMUSCULAR | Status: DC

## 2019-12-05 MED ORDER — EPOETIN ALFA-EPBX 10000 UNIT/ML IJ SOLN
20000.0000 [IU] | Freq: Once | INTRAMUSCULAR | Status: AC
  Administered 2019-12-05: 20000 [IU] via SUBCUTANEOUS

## 2019-12-05 NOTE — Addendum Note (Signed)
Addended by: Adelina Mings on: 12/05/2019 01:42 PM   Modules accepted: Orders

## 2019-12-05 NOTE — Addendum Note (Signed)
Addended by: Oneida Arenas on: 12/05/2019 02:03 PM   Modules accepted: Orders

## 2019-12-05 NOTE — Addendum Note (Signed)
Addended by: Adelina Mings on: 12/05/2019 01:50 PM   Modules accepted: Orders

## 2019-12-11 ENCOUNTER — Telehealth: Payer: Self-pay

## 2019-12-11 NOTE — Telephone Encounter (Signed)
Palliative Care Volunteer call made to check in on patient on 12/10/19. No answer. 

## 2019-12-15 DIAGNOSIS — Z794 Long term (current) use of insulin: Secondary | ICD-10-CM | POA: Diagnosis not present

## 2019-12-15 DIAGNOSIS — E1121 Type 2 diabetes mellitus with diabetic nephropathy: Secondary | ICD-10-CM | POA: Diagnosis not present

## 2019-12-22 DIAGNOSIS — R059 Cough, unspecified: Secondary | ICD-10-CM | POA: Diagnosis not present

## 2019-12-22 DIAGNOSIS — E1165 Type 2 diabetes mellitus with hyperglycemia: Secondary | ICD-10-CM | POA: Diagnosis not present

## 2019-12-22 DIAGNOSIS — N1832 Chronic kidney disease, stage 3b: Secondary | ICD-10-CM | POA: Diagnosis not present

## 2019-12-22 DIAGNOSIS — J90 Pleural effusion, not elsewhere classified: Secondary | ICD-10-CM | POA: Diagnosis not present

## 2019-12-22 DIAGNOSIS — I129 Hypertensive chronic kidney disease with stage 1 through stage 4 chronic kidney disease, or unspecified chronic kidney disease: Secondary | ICD-10-CM | POA: Diagnosis not present

## 2019-12-22 DIAGNOSIS — Z87891 Personal history of nicotine dependence: Secondary | ICD-10-CM | POA: Diagnosis not present

## 2019-12-22 DIAGNOSIS — J431 Panlobular emphysema: Secondary | ICD-10-CM | POA: Diagnosis not present

## 2019-12-28 DIAGNOSIS — J441 Chronic obstructive pulmonary disease with (acute) exacerbation: Secondary | ICD-10-CM | POA: Diagnosis not present

## 2019-12-29 DIAGNOSIS — E1142 Type 2 diabetes mellitus with diabetic polyneuropathy: Secondary | ICD-10-CM | POA: Diagnosis not present

## 2019-12-29 DIAGNOSIS — B351 Tinea unguium: Secondary | ICD-10-CM | POA: Diagnosis not present

## 2020-01-03 DIAGNOSIS — J441 Chronic obstructive pulmonary disease with (acute) exacerbation: Secondary | ICD-10-CM | POA: Diagnosis not present

## 2020-01-05 ENCOUNTER — Other Ambulatory Visit: Payer: Self-pay

## 2020-01-05 ENCOUNTER — Other Ambulatory Visit: Payer: Medicare HMO

## 2020-01-05 ENCOUNTER — Ambulatory Visit: Payer: Medicare HMO

## 2020-01-05 ENCOUNTER — Telehealth: Payer: Self-pay | Admitting: Nurse Practitioner

## 2020-01-05 ENCOUNTER — Other Ambulatory Visit: Payer: Medicare HMO | Admitting: Nurse Practitioner

## 2020-01-05 NOTE — Telephone Encounter (Signed)
I called Arthur [name], Arthur [name] daughter to prescreen prior to f/u PC visit and confirm appointment today, message left with contact information

## 2020-01-08 ENCOUNTER — Telehealth: Payer: Self-pay

## 2020-01-08 NOTE — Telephone Encounter (Signed)
Done .. Pt daughter [name] was made aware of his NEW appt dates 01/09/20 appts were R/S to 01/16/20 per her request due to her work sched

## 2020-01-08 NOTE — Telephone Encounter (Signed)
Per [place] from [facility]: Can we please reschedule this patient to next week.  His authorization was submitted yesterday and it will not be available when he comes in tomorrow.   Ellison Hughs, please reschedule appt per LuAnn request. Appts in Feb will also need to be moved out to be 4 weeks from new r/s date. Please call pt with appts.

## 2020-01-09 ENCOUNTER — Inpatient Hospital Stay: Payer: Medicare HMO

## 2020-01-12 DIAGNOSIS — J90 Pleural effusion, not elsewhere classified: Secondary | ICD-10-CM | POA: Diagnosis not present

## 2020-01-13 ENCOUNTER — Other Ambulatory Visit: Payer: Self-pay | Admitting: Pulmonary Disease

## 2020-01-13 ENCOUNTER — Other Ambulatory Visit
Admission: RE | Admit: 2020-01-13 | Discharge: 2020-01-13 | Disposition: A | Discharge status: Home / Self Care | Payer: Medicare HMO | Source: Ambulatory Visit | Attending: Pulmonary Disease | Admitting: Pulmonary Disease

## 2020-01-13 ENCOUNTER — Other Ambulatory Visit: Payer: Self-pay

## 2020-01-13 DIAGNOSIS — Z20822 Contact with and (suspected) exposure to covid-19: Secondary | ICD-10-CM | POA: Insufficient documentation

## 2020-01-13 DIAGNOSIS — J90 Pleural effusion, not elsewhere classified: Secondary | ICD-10-CM

## 2020-01-13 LAB — SARS CORONAVIRUS 2 BY RT PCR (HOSPITAL ORDER, PERFORMED IN ~~LOC~~ HOSPITAL LAB): SARS Coronavirus 2: NEGATIVE

## 2020-01-14 ENCOUNTER — Other Ambulatory Visit: Payer: Medicare HMO | Admitting: Nurse Practitioner

## 2020-01-14 ENCOUNTER — Telehealth: Payer: Self-pay | Admitting: Nurse Practitioner

## 2020-01-14 ENCOUNTER — Other Ambulatory Visit: Payer: Self-pay

## 2020-01-14 ENCOUNTER — Ambulatory Visit
Admission: RE | Admit: 2020-01-14 | Discharge: 2020-01-14 | Disposition: A | Discharge status: Home / Self Care | Payer: Medicare HMO | Source: Ambulatory Visit | Attending: Pulmonary Disease | Admitting: Pulmonary Disease

## 2020-01-14 ENCOUNTER — Ambulatory Visit
Admission: RE | Admit: 2020-01-14 | Discharge: 2020-01-14 | Disposition: A | Discharge status: Home / Self Care | Payer: Medicare HMO | Source: Ambulatory Visit | Attending: Diagnostic Radiology | Admitting: Diagnostic Radiology

## 2020-01-14 ENCOUNTER — Other Ambulatory Visit: Payer: Self-pay | Admitting: Diagnostic Radiology

## 2020-01-14 DIAGNOSIS — J90 Pleural effusion, not elsewhere classified: Secondary | ICD-10-CM | POA: Diagnosis not present

## 2020-01-14 DIAGNOSIS — Z9889 Other specified postprocedural states: Secondary | ICD-10-CM | POA: Diagnosis not present

## 2020-01-14 DIAGNOSIS — J9383 Other pneumothorax: Secondary | ICD-10-CM | POA: Insufficient documentation

## 2020-01-14 LAB — AMYLASE, PLEURAL OR PERITONEAL FLUID: Amylase, Fluid: 26 U/L

## 2020-01-14 LAB — LACTATE DEHYDROGENASE, PLEURAL OR PERITONEAL FLUID: LD, Fluid: 160 U/L — ABNORMAL HIGH (ref 3–23)

## 2020-01-14 LAB — BODY FLUID CELL COUNT WITH DIFFERENTIAL
Eos, Fluid: 0 %
Lymphs, Fluid: 99 %
Monocyte-Macrophage-Serous Fluid: 1 %
Neutrophil Count, Fluid: 0 %
Other Cells, Fluid: 0 %
Total Nucleated Cell Count, Fluid: 39 cu mm

## 2020-01-14 LAB — PROTEIN, PLEURAL OR PERITONEAL FLUID: Total protein, fluid: 3.3 g/dL

## 2020-01-14 LAB — GLUCOSE, PLEURAL OR PERITONEAL FLUID: Glucose, Fluid: 110 mg/dL

## 2020-01-14 NOTE — Telephone Encounter (Signed)
I called to confirm PC f/u visit today with [name], [name] wishes to reschedule as he has another appointment today. Rescheduled per [name] request

## 2020-01-14 NOTE — Procedures (Signed)
Interventional Radiology Procedure:   Indications: Right pleural effusion  Procedure: US guided thoracentesis  Findings: Removed 500 ml from right chest.  Loculated right pleural effusion.  Complications: None     EBL: Minimal  Plan: Follow up CXR   Merelyn Klump R. Anselm Pancoast, MD  Pager: [phone]

## 2020-01-15 LAB — ACID FAST SMEAR (AFB, MYCOBACTERIA): Acid Fast Smear: NEGATIVE

## 2020-01-15 LAB — PH, BODY FLUID: pH, Body Fluid: 7.9

## 2020-01-16 ENCOUNTER — Inpatient Hospital Stay: Payer: Medicare HMO

## 2020-01-16 ENCOUNTER — Inpatient Hospital Stay: Payer: Medicare HMO | Attending: Oncology

## 2020-01-16 VITALS — BP 119/52 | HR 77

## 2020-01-16 DIAGNOSIS — I129 Hypertensive chronic kidney disease with stage 1 through stage 4 chronic kidney disease, or unspecified chronic kidney disease: Secondary | ICD-10-CM | POA: Diagnosis not present

## 2020-01-16 DIAGNOSIS — Z79899 Other long term (current) drug therapy: Secondary | ICD-10-CM | POA: Diagnosis not present

## 2020-01-16 DIAGNOSIS — N189 Chronic kidney disease, unspecified: Secondary | ICD-10-CM | POA: Diagnosis not present

## 2020-01-16 DIAGNOSIS — D631 Anemia in chronic kidney disease: Secondary | ICD-10-CM | POA: Diagnosis not present

## 2020-01-16 LAB — COMP PANEL: LEUKEMIA/LYMPHOMA

## 2020-01-16 LAB — HEMOGLOBIN AND HEMATOCRIT, BLOOD
HCT: 22.4 % — ABNORMAL LOW (ref 39.0–52.0)
Hemoglobin: 7 g/dL — ABNORMAL LOW (ref 13.0–17.0)

## 2020-01-16 LAB — CYTOLOGY - NON PAP

## 2020-01-16 MED ORDER — EPOETIN ALFA-EPBX 40000 UNIT/ML IJ SOLN
40000.0000 [IU] | Freq: Once | INTRAMUSCULAR | Status: AC
  Administered 2020-01-16: 40000 [IU] via SUBCUTANEOUS
  Filled 2020-01-16: qty 1

## 2020-01-16 MED ORDER — EPOETIN ALFA-EPBX 10000 UNIT/ML IJ SOLN
20000.0000 [IU] | Freq: Once | INTRAMUSCULAR | Status: AC
  Administered 2020-01-16: 20000 [IU] via SUBCUTANEOUS
  Filled 2020-01-16: qty 2

## 2020-01-16 MED ORDER — EPOETIN ALFA-EPBX 40000 UNIT/ML IJ SOLN: 60000.0000 [IU] | Freq: Once | INTRAMUSCULAR | Status: DC

## 2020-01-17 LAB — CHOLESTEROL, BODY FLUID: Cholesterol, Fluid: 34 mg/dL

## 2020-01-18 LAB — BODY FLUID CULTURE: Culture: NO GROWTH

## 2020-01-21 ENCOUNTER — Other Ambulatory Visit: Payer: Self-pay

## 2020-01-21 ENCOUNTER — Other Ambulatory Visit: Payer: Medicare HMO | Admitting: Nurse Practitioner

## 2020-01-26 DIAGNOSIS — J449 Chronic obstructive pulmonary disease, unspecified: Secondary | ICD-10-CM | POA: Diagnosis not present

## 2020-01-28 DIAGNOSIS — J441 Chronic obstructive pulmonary disease with (acute) exacerbation: Secondary | ICD-10-CM | POA: Diagnosis not present

## 2020-02-03 ENCOUNTER — Other Ambulatory Visit: Payer: Self-pay

## 2020-02-03 ENCOUNTER — Ambulatory Visit: Payer: Medicare HMO | Admitting: Dermatology

## 2020-02-03 DIAGNOSIS — J441 Chronic obstructive pulmonary disease with (acute) exacerbation: Secondary | ICD-10-CM | POA: Diagnosis not present

## 2020-02-03 DIAGNOSIS — L821 Other seborrheic keratosis: Secondary | ICD-10-CM | POA: Diagnosis not present

## 2020-02-03 DIAGNOSIS — C44320 Squamous cell carcinoma of skin of unspecified parts of face: Secondary | ICD-10-CM

## 2020-02-03 DIAGNOSIS — C44329 Squamous cell carcinoma of skin of other parts of face: Secondary | ICD-10-CM | POA: Diagnosis not present

## 2020-02-03 DIAGNOSIS — C4492 Squamous cell carcinoma of skin, unspecified: Secondary | ICD-10-CM

## 2020-02-03 DIAGNOSIS — L578 Other skin changes due to chronic exposure to nonionizing radiation: Secondary | ICD-10-CM

## 2020-02-03 DIAGNOSIS — L570 Actinic keratosis: Secondary | ICD-10-CM

## 2020-02-03 DIAGNOSIS — D485 Neoplasm of uncertain behavior of skin: Secondary | ICD-10-CM

## 2020-02-03 HISTORY — DX: Squamous cell carcinoma of skin, unspecified: C44.92

## 2020-02-03 NOTE — Patient Instructions (Signed)

## 2020-02-03 NOTE — Progress Notes (Deleted)
   New Patient Visit  Subjective  Arthur [name] is a 87 y.o. male who presents for the following: Other (Spots on face x months that are itchy).   The following portions of the chart were reviewed this encounter and updated as appropriate:       Review of Systems:  No other skin or systemic complaints except as noted in HPI or Assessment and Plan.  Objective  Well appearing patient in no apparent distress; mood and affect are within normal limits.  {[id number]::"B full examination was performed including scalp, head, eyes, ears, nose, lips, neck, chest, axillae, abdomen, back, buttocks, bilateral upper extremities, bilateral lower extremities, hands, feet, fingers, toes, fingernails, and toenails. All findings within normal limits unless otherwise noted below."}    Assessment & Plan   No follow-ups on file.

## 2020-02-03 NOTE — Progress Notes (Signed)
   Follow-Up Visit   Subjective  Arthur [name] is a 87 y.o. male who presents for the following: Other (Spots on face x months that are itchy).  One spot has grown.  Accompanied by daughter  The following portions of the chart were reviewed this encounter and updated as appropriate:       Review of Systems:  No other skin or systemic complaints except as noted in HPI or Assessment and Plan.  Objective  Well appearing patient in no apparent distress; mood and affect are within normal limits.  A focused examination was performed including face. Relevant physical exam findings are noted in the Assessment and Plan.  Objective  Right lat cheek: 9 mm keratotic papule     Objective  Right Temple x 1 right ant nasal ala x 1 (2): Hyperkeratotic pink papules   Assessment & Plan    Actinic Damage - chronic, secondary to cumulative UV radiation exposure/sun exposure over time - diffuse scaly erythematous macules with underlying dyspigmentation - Recommend daily broad spectrum sunscreen SPF 30+ to sun-exposed areas, reapply every 2 hours as needed.  - Call for new or changing lesions.  Seborrheic Keratoses - Stuck-on, waxy, tan-brown papules and plaques  - Discussed benign etiology and prognosis. - Observe - Call for any changes   Neoplasm of uncertain behavior of skin Right lat cheek  Epidermal / dermal shaving  Lesion diameter (cm):  0.9 Informed consent: discussed and consent obtained   Timeout: patient name, date of birth, surgical site, and procedure verified   Procedure prep:  Patient was prepped and draped in usual sterile fashion Prep type:  Isopropyl alcohol Anesthesia: the lesion was anesthetized in a standard fashion   Anesthetic:  1% lidocaine w/ epinephrine 1-100,000 buffered w/ 8.4% NaHCO3 Instrument used: flexible razor blade   Hemostasis achieved with: pressure, aluminum chloride and electrodesiccation   Outcome: patient tolerated procedure well     Destruction of lesion  Destruction method: electrodesiccation and curettage   Timeout:  patient name, date of birth, surgical site, and procedure verified Curettage performed in three different directions: Yes   Electrodesiccation performed over the curetted area: Yes   Lesion length (cm):  0.9 Lesion width (cm):  0.9 Margin per side (cm):  0.2 Final wound size (cm):  1.3 Hemostasis achieved with:  pressure, aluminum chloride and electrodesiccation Outcome: patient tolerated procedure well with no complications   Post-procedure details: wound care instructions given    Specimen 1 - Surgical pathology Differential Diagnosis: Inflamed SK R/O SCC Check Margins: No 9 mm keratotic papule EDC today   AK (actinic keratosis) (2) Right Temple x 1 right ant nasal ala x 1  Destruction of lesion - Right Temple x 1 right ant nasal ala x 1  Destruction method: cryotherapy   Informed consent: discussed and consent obtained   Timeout:  patient name, date of birth, surgical site, and procedure verified Lesion destroyed using liquid nitrogen: Yes   Region frozen until ice ball extended beyond lesion: Yes   Outcome: patient tolerated procedure well with no complications   Post-procedure details: wound care instructions given    Return in about 3 months (around 05/02/2020) for AK follow up, Biopsy Follow up.   I, Ashok Cordia, CMA, am acting as scribe for Brendolyn Patty, MD .  Documentation: I have reviewed the above documentation for accuracy and completeness, and I agree with the above.  Brendolyn Patty MD

## 2020-02-04 ENCOUNTER — Other Ambulatory Visit: Payer: Medicare HMO | Admitting: Nurse Practitioner

## 2020-02-06 ENCOUNTER — Ambulatory Visit: Payer: Medicare HMO | Admitting: Oncology

## 2020-02-06 ENCOUNTER — Other Ambulatory Visit: Payer: Medicare HMO

## 2020-02-06 ENCOUNTER — Ambulatory Visit: Payer: Medicare HMO

## 2020-02-09 ENCOUNTER — Telehealth: Payer: Self-pay

## 2020-02-09 NOTE — Telephone Encounter (Signed)
-----   Message from Brendolyn Patty, MD sent at 02/09/2020 10:47 AM EST ----- Skin , right lat cheek WELL DIFFERENTIATED SQUAMOUS CELL CARCINOMA  SCC skin cancer- already treated with EDC.  Will monitor.  If recurs, would need excision

## 2020-02-09 NOTE — Telephone Encounter (Signed)
Advised pt's daughter, [name], of bx results/sh

## 2020-02-12 LAB — FUNGUS CULTURE RESULT

## 2020-02-12 LAB — FUNGUS CULTURE WITH STAIN

## 2020-02-12 LAB — FUNGAL ORGANISM REFLEX

## 2020-02-13 ENCOUNTER — Inpatient Hospital Stay (HOSPITAL_BASED_OUTPATIENT_CLINIC_OR_DEPARTMENT_OTHER): Payer: Medicare HMO | Admitting: Oncology

## 2020-02-13 ENCOUNTER — Encounter: Payer: Self-pay | Admitting: Oncology

## 2020-02-13 ENCOUNTER — Inpatient Hospital Stay: Payer: Medicare HMO | Attending: Oncology

## 2020-02-13 ENCOUNTER — Inpatient Hospital Stay: Payer: Medicare HMO

## 2020-02-13 ENCOUNTER — Other Ambulatory Visit: Payer: Self-pay

## 2020-02-13 ENCOUNTER — Other Ambulatory Visit: Payer: Self-pay | Admitting: Oncology

## 2020-02-13 VITALS — BP 150/55 | HR 54 | Temp 97.6°F | Resp 20

## 2020-02-13 VITALS — BP 141/66 | HR 68 | Temp 98.6°F | Wt 182.2 lb

## 2020-02-13 DIAGNOSIS — Z87891 Personal history of nicotine dependence: Secondary | ICD-10-CM | POA: Insufficient documentation

## 2020-02-13 DIAGNOSIS — N184 Chronic kidney disease, stage 4 (severe): Secondary | ICD-10-CM | POA: Diagnosis not present

## 2020-02-13 DIAGNOSIS — I10 Essential (primary) hypertension: Secondary | ICD-10-CM | POA: Diagnosis not present

## 2020-02-13 DIAGNOSIS — Z79899 Other long term (current) drug therapy: Secondary | ICD-10-CM | POA: Insufficient documentation

## 2020-02-13 DIAGNOSIS — M25511 Pain in right shoulder: Secondary | ICD-10-CM | POA: Diagnosis not present

## 2020-02-13 DIAGNOSIS — E119 Type 2 diabetes mellitus without complications: Secondary | ICD-10-CM | POA: Diagnosis not present

## 2020-02-13 DIAGNOSIS — Z85828 Personal history of other malignant neoplasm of skin: Secondary | ICD-10-CM | POA: Insufficient documentation

## 2020-02-13 DIAGNOSIS — D631 Anemia in chronic kidney disease: Secondary | ICD-10-CM | POA: Diagnosis not present

## 2020-02-13 DIAGNOSIS — N189 Chronic kidney disease, unspecified: Secondary | ICD-10-CM

## 2020-02-13 DIAGNOSIS — J449 Chronic obstructive pulmonary disease, unspecified: Secondary | ICD-10-CM | POA: Diagnosis not present

## 2020-02-13 DIAGNOSIS — E785 Hyperlipidemia, unspecified: Secondary | ICD-10-CM | POA: Diagnosis not present

## 2020-02-13 DIAGNOSIS — D509 Iron deficiency anemia, unspecified: Secondary | ICD-10-CM | POA: Diagnosis not present

## 2020-02-13 DIAGNOSIS — I130 Hypertensive heart and chronic kidney disease with heart failure and stage 1 through stage 4 chronic kidney disease, or unspecified chronic kidney disease: Secondary | ICD-10-CM | POA: Diagnosis not present

## 2020-02-13 DIAGNOSIS — D649 Anemia, unspecified: Secondary | ICD-10-CM | POA: Diagnosis not present

## 2020-02-13 DIAGNOSIS — R0602 Shortness of breath: Secondary | ICD-10-CM | POA: Insufficient documentation

## 2020-02-13 DIAGNOSIS — M129 Arthropathy, unspecified: Secondary | ICD-10-CM | POA: Diagnosis not present

## 2020-02-13 DIAGNOSIS — I509 Heart failure, unspecified: Secondary | ICD-10-CM | POA: Diagnosis not present

## 2020-02-13 LAB — CBC WITH DIFFERENTIAL/PLATELET
Abs Immature Granulocytes: 0.05 10*3/uL (ref 0.00–0.07)
Basophils Absolute: 0.1 10*3/uL (ref 0.0–0.1)
Basophils Relative: 1 %
Eosinophils Absolute: 0.2 10*3/uL (ref 0.0–0.5)
Eosinophils Relative: 4 %
HCT: 22.2 % — ABNORMAL LOW (ref 39.0–52.0)
Hemoglobin: 6.9 g/dL — ABNORMAL LOW (ref 13.0–17.0)
Immature Granulocytes: 1 %
Lymphocytes Relative: 24 %
Lymphs Abs: 1.3 10*3/uL (ref 0.7–4.0)
MCH: 29.1 pg (ref 26.0–34.0)
MCHC: 31.1 g/dL (ref 30.0–36.0)
MCV: 93.7 fL (ref 80.0–100.0)
Monocytes Absolute: 0.4 10*3/uL (ref 0.1–1.0)
Monocytes Relative: 8 %
Neutro Abs: 3.5 10*3/uL (ref 1.7–7.7)
Neutrophils Relative %: 62 %
Platelets: 243 10*3/uL (ref 150–400)
RBC: 2.37 MIL/uL — ABNORMAL LOW (ref 4.22–5.81)
RDW: 20.5 % — ABNORMAL HIGH (ref 11.5–15.5)
WBC: 5.6 10*3/uL (ref 4.0–10.5)
nRBC: 0 % (ref 0.0–0.2)

## 2020-02-13 LAB — COMPREHENSIVE METABOLIC PANEL
ALT: 7 U/L (ref 0–44)
AST: 11 U/L — ABNORMAL LOW (ref 15–41)
Albumin: 3.6 g/dL (ref 3.5–5.0)
Alkaline Phosphatase: 55 U/L (ref 38–126)
Anion gap: 9 (ref 5–15)
BUN: 48 mg/dL — ABNORMAL HIGH (ref 8–23)
CO2: 25 mmol/L (ref 22–32)
Calcium: 8.3 mg/dL — ABNORMAL LOW (ref 8.9–10.3)
Chloride: 103 mmol/L (ref 98–111)
Creatinine, Ser: 2.19 mg/dL — ABNORMAL HIGH (ref 0.61–1.24)
GFR, Estimated: 29 mL/min — ABNORMAL LOW (ref >60)
Glucose, Bld: 215 mg/dL — ABNORMAL HIGH (ref 70–99)
Potassium: 4.3 mmol/L (ref 3.5–5.1)
Sodium: 137 mmol/L (ref 135–145)
Total Bilirubin: 0.7 mg/dL (ref 0.3–1.2)
Total Protein: 6.6 g/dL (ref 6.5–8.1)

## 2020-02-13 LAB — SAMPLE TO BLOOD BANK

## 2020-02-13 LAB — IRON AND TIBC
Iron: 68 ug/dL (ref 45–182)
Saturation Ratios: 43 % — ABNORMAL HIGH (ref 17.9–39.5)
TIBC: 157 ug/dL — ABNORMAL LOW (ref 250–450)
UIBC: 89 ug/dL

## 2020-02-13 LAB — RETIC PANEL
Immature Retic Fract: 14 % (ref 2.3–15.9)
RBC.: 2.37 MIL/uL — ABNORMAL LOW (ref 4.22–5.81)
Retic Count, Absolute: 22.5 10*3/uL (ref 19.0–186.0)
Retic Ct Pct: 1 % (ref 0.4–3.1)
Reticulocyte Hemoglobin: 24.2 pg — ABNORMAL LOW (ref >27.9)

## 2020-02-13 LAB — FERRITIN: Ferritin: 638 ng/mL — ABNORMAL HIGH (ref 24–336)

## 2020-02-13 MED ORDER — SODIUM CHLORIDE 0.9 % IV SOLN: 200.0000 mg | Freq: Once | INTRAVENOUS | Status: DC

## 2020-02-13 MED ORDER — IRON SUCROSE 20 MG/ML IV SOLN
200.0000 mg | Freq: Once | INTRAVENOUS | Status: AC
  Administered 2020-02-13: 200 mg via INTRAVENOUS
  Filled 2020-02-13: qty 10

## 2020-02-13 MED ORDER — SODIUM CHLORIDE 0.9 % IV SOLN
Freq: Once | INTRAVENOUS | Status: AC
  Filled 2020-02-13: qty 250

## 2020-02-13 NOTE — Progress Notes (Signed)
Pt stable at discharge.  

## 2020-02-13 NOTE — Progress Notes (Signed)
Hematology/Oncology  [facility] Telephone:([phone] [phone]   Patient Care Team: Baxter Hire, MD as PCP - General (Internal Medicine) Earlie Server, MD as Consulting Physician (Oncology)  REFERRING PROVIDER: Baxter Hire, MD  CHIEF COMPLAINTS/REASON FOR VISIT:  Follow-up for anemia secondary to chronic kidney disease.  Possible MDS   INTERVAL HISTORY Arthur [name] is a 87 y.o. male who has above history reviewed by me today presents for follow up visit for management of anemia secondary to chronic kidney disease, possible underlying MDS Problems and complaints are listed below: Accompanied by his daughter.  Patient reports feeling tired and fatigued.  Denies any black or bloody stool..    Review of Systems  Constitutional: Positive for fatigue. Negative for appetite change, chills, fever and unexpected weight change.  HENT:   Negative for hearing loss and voice change.   Eyes: Negative for eye problems and icterus.  Respiratory: Negative for chest tightness, cough and shortness of breath.   Cardiovascular: Negative for chest pain and leg swelling.  Gastrointestinal: Negative for abdominal distention and abdominal pain.  Endocrine: Negative for hot flashes.  Genitourinary: Negative for difficulty urinating, dysuria and frequency.   Musculoskeletal: Negative for arthralgias.       Chronic back pain  Skin: Negative for itching and rash.  Neurological: Negative for light-headedness and numbness.  Hematological: Negative for adenopathy. Does not bruise/bleed easily.  Psychiatric/Behavioral: Negative for confusion.    MEDICAL HISTORY:  Past Medical History:  Diagnosis Date  . Anemia of chronic kidney failure 08/21/2018  . Arthritis    lower back  . CHF (congestive heart failure) ([place])   . CKD (chronic kidney disease), stage III ([place])   . COPD (chronic obstructive pulmonary disease) ([place])   . Diabetes mellitus without complication  ([place])    type 2  . Dyspnea    when active -wears oxygen 2L via [place]  . History of blood transfusion   . Hyperlipidemia   . Hypertension   . Requires continuous at home supplemental oxygen    2L via [place]  . Squamous cell carcinoma of skin 02/03/2020   R lat cheek, EDC    SURGICAL HISTORY: Past Surgical History:  Procedure Laterality Date  . COLONOSCOPY WITH PROPOFOL N/A 06/11/2018   Procedure: COLONOSCOPY WITH PROPOFOL;  Surgeon: Jonathon Bellows, MD;  Location: [facility] ENDOSCOPY;  Service: Gastroenterology;  Laterality: N/A;  . COLONOSCOPY WITH PROPOFOL N/A 06/13/2018   Procedure: COLONOSCOPY WITH PROPOFOL;  Surgeon: Jonathon Bellows, MD;  Location: [facility] ENDOSCOPY;  Service: Gastroenterology;  Laterality: N/A;  . ESOPHAGOGASTRODUODENOSCOPY Left 06/10/2018   Procedure: ESOPHAGOGASTRODUODENOSCOPY (EGD);  Surgeon: Jonathon Bellows, MD;  Location: [facility] ENDOSCOPY;  Service: Gastroenterology;  Laterality: Left;  . EYE SURGERY     removed cataracts  . GIVENS CAPSULE STUDY N/A 06/28/2018   Procedure: GIVENS CAPSULE STUDY;  Surgeon: Lucilla Lame, MD;  Location: [facility] ENDOSCOPY;  Service: Endoscopy;  Laterality: N/A;  . GIVENS CAPSULE STUDY N/A 06/30/2018   Procedure: GIVENS CAPSULE STUDY;  Surgeon: Jonathon Bellows, MD;  Location: [facility] ENDOSCOPY;  Service: Gastroenterology;  Laterality: N/A;  . KYPHOPLASTY N/A 10/31/2018   Procedure: KYPHOPLASTY L2;  Surgeon: [name], MD;  Location: [facility] ORS;  Service: Orthopedics;  Laterality: N/A;  . KYPHOPLASTY N/A 12/24/2018   Procedure: KYPHOPLASTY L3;  Surgeon: [name], MD;  Location: [facility] ORS;  Service: Orthopedics;  Laterality: N/A;  . KYPHOPLASTY N/A 02/18/2019   Procedure: L2 KYPHOPLASTY;  Surgeon: [name], MD;  Location: [facility] ORS;  Service:  Orthopedics;  Laterality: N/A;  . RADIOLOGY WITH ANESTHESIA N/A 01/28/2019   Procedure: MRI LUMBER SPINE WITHOUT CONTRAST;  Surgeon: Radiologist, Medication, MD;  Location: [place];  Service: Radiology;  Laterality: N/A;     SOCIAL HISTORY: Social History   Socioeconomic History  . Marital status: Widowed    Spouse name: Not on file  . Number of children: Not on file  . Years of education: Not on file  . Highest education level: Not on file  Occupational History  . Not on file  Tobacco Use  . Smoking status: Former Smoker    Packs/day: 2.00    Years: 35.00    Pack years: 70.00    Types: Cigarettes    Quit date: 11/04/1982    Years since quitting: 37.3  . Smokeless tobacco: Current User    Types: Chew  Vaping Use  . Vaping Use: Never used  Substance and Sexual Activity  . Alcohol use: Not Currently  . Drug use: No  . Sexual activity: Not Currently  Other Topics Concern  . Not on file  Social History Narrative  . Not on file   Social Determinants of Health   Financial Resource Strain: Not on file  Food Insecurity: Not on file  Transportation Needs: Not on file  Physical Activity: Not on file  Stress: Not on file  Social Connections: Not on file  Intimate Partner Violence: Not on file    FAMILY HISTORY: Family History  Problem Relation Age of Onset  . COPD Mother   . Cancer Father     ALLERGIES:  is allergic to codeine.  MEDICATIONS:  Current Outpatient Medications  Medication Sig Dispense Refill  . acetaminophen (TYLENOL) 325 MG tablet Take 2 tablets (650 mg total) by mouth every 6 (six) hours as needed for mild pain (or Fever >/= 101).    Marland Kitchen albuterol (VENTOLIN HFA) 108 (90 Base) MCG/ACT inhaler Inhale 2 puffs into the lungs every 6 (six) hours as needed for wheezing or shortness of breath. 1 Inhaler 2  . amLODipine (NORVASC) 2.5 MG tablet Take 2.5 mg by mouth daily.    Marland Kitchen ascorbic acid (VITAMIN C) 500 MG tablet Take 500 mg by mouth at bedtime.    . docusate sodium (COLACE) 100 MG capsule Take 1 capsule (100 mg total) by mouth 2 (two) times daily. 60 capsule 0  . insulin aspart (NOVOLOG) 100 UNIT/ML injection 4 units prior to meals if sugars above 200 10 mL 11  . LEVEMIR 100  UNIT/ML injection Inject 0.04 mLs (4 Units total) into the skin at bedtime. 10 mL 0  . metoCLOPramide (REGLAN) 5 MG tablet Take 5 mg by mouth 4 (four) times daily -  before meals and at bedtime.    Marland Kitchen nystatin cream (MYCOSTATIN) Apply topically.    . pantoprazole (PROTONIX) 40 MG tablet Take 1 tablet (40 mg total) by mouth daily.    . polyethylene glycol powder (GLYCOLAX/MIRALAX) 17 GM/SCOOP powder Take 17 g by mouth every other day.     . potassium chloride (KLOR-CON) 10 MEQ tablet Take 10 mEq by mouth every evening.    . pravastatin (PRAVACHOL) 10 MG tablet Take 10 mg by mouth at bedtime.    . promethazine (PHENERGAN) 25 MG tablet Take 25 mg by mouth every 4 (four) hours as needed for nausea or vomiting.    . tamsulosin (FLOMAX) 0.4 MG CAPS capsule Take 0.4 mg by mouth daily.    Marland Kitchen tiotropium (SPIRIVA) 18 MCG inhalation capsule Place 18  mcg into inhaler and inhale daily.    Marland Kitchen torsemide (DEMADEX) 20 MG tablet Take 20 mg by mouth every Monday, Wednesday, and Friday.    . triamcinolone cream (KENALOG) 0.5 %     . TRUE METRIX BLOOD GLUCOSE TEST test strip 1 each by Other route every morning.     No current facility-administered medications for this visit.     PHYSICAL EXAMINATION: ECOG PERFORMANCE STATUS: 3 - Symptomatic, >50% confined to bed Vitals:   02/13/20 1049  BP: (!) 141/66  Pulse: 68  Temp: 98.6 F (37 C)  SpO2: 100%   Filed Weights   02/13/20 1049  Weight: 182 lb 4 oz (82.7 kg)    Physical Exam Constitutional:      General: He is not in acute distress.    Appearance: He is ill-appearing.     Comments: Sits in wheel chair  HENT:     Head: Normocephalic and atraumatic.  Eyes:     General: No scleral icterus.    Pupils: Pupils are equal, round, and reactive to light.  Cardiovascular:     Rate and Rhythm: Normal rate and regular rhythm.     Heart sounds: Normal heart sounds.  Pulmonary:     Effort: Pulmonary effort is normal. No respiratory distress.     Breath  sounds: No wheezing.     Comments:  Decreased breath sounds at right base Abdominal:     General: Bowel sounds are normal. There is no distension.     Palpations: Abdomen is soft. There is no mass.     Tenderness: There is no abdominal tenderness.  Musculoskeletal:        General: No deformity. Normal range of motion.     Cervical back: Normal range of motion and neck supple.     Comments: Chronic bilateral lower extremity edema.   Skin:    General: Skin is warm and dry.     Coloration: Skin is not pale.     Findings: No erythema or rash.  Neurological:     Mental Status: He is alert. Mental status is at baseline.     Cranial Nerves: No cranial nerve deficit.     Coordination: Coordination normal.  Psychiatric:        Mood and Affect: Mood normal.     LABORATORY DATA:  I have reviewed the data as listed Lab Results  Component Value Date   WBC 5.6 02/13/2020   HGB 6.9 (L) 02/13/2020   HCT 22.2 (L) 02/13/2020   MCV 93.7 02/13/2020   PLT 243 02/13/2020   Recent Labs    03/10/19 0538 07/23/19 1535 08/14/19 1528 02/13/20 1035  NA 130* 134* 138 137  K 3.9 4.0 4.9 4.3  CL 93* 99 107 103  CO2 32 _0 GLUCOSE 116* 143* 154* 215*  BUN 27* 41* 46* 48*  CREATININE 1.59* 2.22* 1.98* 2.19*  CALCIUM 8.3* 8.4* 8.2* 8.3*  GFRNONAA 40* 26* 30* 29*  GFRAA 46* 30* 35*  --   PROT  --  7.7 7.4 6.6  ALBUMIN  --  3.7 3.7 3.6  AST  --  9* 10* 11*  ALT  --  _1 ALKPHOS  --  65 59 55  BILITOT  --  0.6 0.6 0.7   Iron/TIBC/Ferritin/ %Sat    Component Value Date/Time   IRON 68 02/13/2020 1035   IRON 36 (L) 07/15/2018 1544   TIBC 157 (L) 02/13/2020 1035   TIBC 172 (L)  07/15/2018 1544   FERRITIN 638 (H) 02/13/2020 1035   FERRITIN 535 (H) 07/15/2018 1544   IRONPCTSAT 43 (H) 02/13/2020 1035   IRONPCTSAT 21 07/15/2018 1544      RADIOGRAPHIC STUDIES: I have personally reviewed the radiological images as listed and agreed with the findings in the report. No results  found.    ASSESSMENT & PLAN:  1. Anemia of chronic renal failure, stage 4 (severe) ([place])   2. Symptomatic anemia   3. Iron deficiency anemia, unspecified iron deficiency anemia type     #Anemia of chronic kidney disease,  Suspect MDS cannot be ruled out.  Patient declines bone marrow biopsy due to multiple other medical problems. Patient has been on Retacrit 60,000 units monthly. Progressively worsening of anemia.  Hemoglobin 6.9 today. There is no capacity at the cancer center for blood transfusion today.  Recommend patient to go to emergency room for blood transfusion and he declined. Reticulocyte panel showed persistently low reticulocyte hemoglobin.  Indicating underlying iron deficiency. Iron panel is consistent with chronic anemia.  Ferritin may be falsely elevated due to chronic inflammation. Patient will proceed with IV Venofer 200 mg x 1 today. He will proceed with Retacrit on 02/16/2019.  60,000 units.  I discussed with patient and daughter about the possibility of patient becoming transfusion dependent due to underlying disease progression.  Repeat lab work in 1 week, MD, possible blood transfusion.  All questions were answered. The patient knows to call the clinic with any problems questions or concerns.   Earlie Server, MD, PhD Hematology Oncology [facility] Pager- [phone] 02/13/2020

## 2020-02-16 ENCOUNTER — Inpatient Hospital Stay: Payer: Medicare HMO

## 2020-02-16 VITALS — BP 166/75 | HR 69

## 2020-02-16 DIAGNOSIS — N189 Chronic kidney disease, unspecified: Secondary | ICD-10-CM

## 2020-02-16 DIAGNOSIS — M25511 Pain in right shoulder: Secondary | ICD-10-CM | POA: Diagnosis not present

## 2020-02-16 DIAGNOSIS — D631 Anemia in chronic kidney disease: Secondary | ICD-10-CM | POA: Diagnosis not present

## 2020-02-16 DIAGNOSIS — J449 Chronic obstructive pulmonary disease, unspecified: Secondary | ICD-10-CM | POA: Diagnosis not present

## 2020-02-16 DIAGNOSIS — M129 Arthropathy, unspecified: Secondary | ICD-10-CM | POA: Diagnosis not present

## 2020-02-16 DIAGNOSIS — N184 Chronic kidney disease, stage 4 (severe): Secondary | ICD-10-CM | POA: Diagnosis not present

## 2020-02-16 DIAGNOSIS — I509 Heart failure, unspecified: Secondary | ICD-10-CM | POA: Diagnosis not present

## 2020-02-16 DIAGNOSIS — E119 Type 2 diabetes mellitus without complications: Secondary | ICD-10-CM | POA: Diagnosis not present

## 2020-02-16 DIAGNOSIS — Z79899 Other long term (current) drug therapy: Secondary | ICD-10-CM | POA: Diagnosis not present

## 2020-02-16 DIAGNOSIS — I130 Hypertensive heart and chronic kidney disease with heart failure and stage 1 through stage 4 chronic kidney disease, or unspecified chronic kidney disease: Secondary | ICD-10-CM | POA: Diagnosis not present

## 2020-02-16 MED ORDER — EPOETIN ALFA-EPBX 40000 UNIT/ML IJ SOLN: 60000.0000 [IU] | Freq: Once | INTRAMUSCULAR | Status: DC

## 2020-02-16 MED ORDER — EPOETIN ALFA-EPBX 10000 UNIT/ML IJ SOLN
20000.0000 [IU] | Freq: Once | INTRAMUSCULAR | Status: AC
  Administered 2020-02-16: 20000 [IU] via SUBCUTANEOUS
  Filled 2020-02-16: qty 2

## 2020-02-16 MED ORDER — EPOETIN ALFA-EPBX 40000 UNIT/ML IJ SOLN
40000.0000 [IU] | Freq: Once | INTRAMUSCULAR | Status: AC
  Administered 2020-02-16: 40000 [IU] via SUBCUTANEOUS
  Filled 2020-02-16: qty 1

## 2020-02-20 ENCOUNTER — Inpatient Hospital Stay: Payer: Medicare HMO

## 2020-02-20 ENCOUNTER — Other Ambulatory Visit: Payer: Self-pay | Admitting: Oncology

## 2020-02-20 ENCOUNTER — Telehealth: Payer: Self-pay

## 2020-02-20 DIAGNOSIS — I130 Hypertensive heart and chronic kidney disease with heart failure and stage 1 through stage 4 chronic kidney disease, or unspecified chronic kidney disease: Secondary | ICD-10-CM | POA: Diagnosis not present

## 2020-02-20 DIAGNOSIS — J449 Chronic obstructive pulmonary disease, unspecified: Secondary | ICD-10-CM | POA: Diagnosis not present

## 2020-02-20 DIAGNOSIS — E119 Type 2 diabetes mellitus without complications: Secondary | ICD-10-CM | POA: Diagnosis not present

## 2020-02-20 DIAGNOSIS — I509 Heart failure, unspecified: Secondary | ICD-10-CM | POA: Diagnosis not present

## 2020-02-20 DIAGNOSIS — D649 Anemia, unspecified: Secondary | ICD-10-CM

## 2020-02-20 DIAGNOSIS — Z79899 Other long term (current) drug therapy: Secondary | ICD-10-CM | POA: Diagnosis not present

## 2020-02-20 DIAGNOSIS — M25511 Pain in right shoulder: Secondary | ICD-10-CM | POA: Diagnosis not present

## 2020-02-20 DIAGNOSIS — D631 Anemia in chronic kidney disease: Secondary | ICD-10-CM | POA: Diagnosis not present

## 2020-02-20 DIAGNOSIS — N184 Chronic kidney disease, stage 4 (severe): Secondary | ICD-10-CM | POA: Diagnosis not present

## 2020-02-20 DIAGNOSIS — M129 Arthropathy, unspecified: Secondary | ICD-10-CM | POA: Diagnosis not present

## 2020-02-20 LAB — HEMOGLOBIN AND HEMATOCRIT, BLOOD
HCT: 22.4 % — ABNORMAL LOW (ref 39.0–52.0)
Hemoglobin: 6.9 g/dL — ABNORMAL LOW (ref 13.0–17.0)

## 2020-02-20 LAB — SAMPLE TO BLOOD BANK

## 2020-02-20 LAB — PREPARE RBC (CROSSMATCH)

## 2020-02-20 MED ORDER — SODIUM CHLORIDE 0.9% IV SOLUTION
250.0000 mL | Freq: Once | INTRAVENOUS | Status: AC
  Administered 2020-02-20: 250 mL via INTRAVENOUS
  Filled 2020-02-20: qty 250

## 2020-02-20 MED ORDER — ACETAMINOPHEN 325 MG PO TABS
650.0000 mg | ORAL_TABLET | Freq: Once | ORAL | Status: AC
  Administered 2020-02-20: 650 mg via ORAL
  Filled 2020-02-20: qty 2

## 2020-02-20 MED ORDER — SODIUM CHLORIDE 0.9% FLUSH
10.0000 mL | INTRAVENOUS | Status: DC | PRN
  Filled 2020-02-20: qty 10

## 2020-02-20 MED ORDER — DIPHENHYDRAMINE HCL 25 MG PO CAPS
25.0000 mg | ORAL_CAPSULE | Freq: Once | ORAL | Status: AC
  Administered 2020-02-20: 25 mg via ORAL
  Filled 2020-02-20: qty 1

## 2020-02-20 NOTE — Telephone Encounter (Signed)
Volunteer called patient on behalf of Palliative Care and did not get a answer from patient/family. ° °

## 2020-02-21 LAB — TYPE AND SCREEN
ABO/RH(D): A POS
Antibody Screen: NEGATIVE
Unit division: 0

## 2020-02-21 LAB — BPAM RBC
Blood Product Expiration Date: 202202252359
ISSUE DATE / TIME: 202202181103
Unit Type and Rh: 600

## 2020-02-23 ENCOUNTER — Other Ambulatory Visit: Payer: Medicare HMO | Admitting: Nurse Practitioner

## 2020-02-23 ENCOUNTER — Other Ambulatory Visit: Payer: Self-pay

## 2020-02-23 ENCOUNTER — Encounter: Payer: Self-pay | Admitting: Nurse Practitioner

## 2020-02-23 DIAGNOSIS — D509 Iron deficiency anemia, unspecified: Secondary | ICD-10-CM | POA: Diagnosis not present

## 2020-02-23 DIAGNOSIS — I509 Heart failure, unspecified: Secondary | ICD-10-CM

## 2020-02-23 DIAGNOSIS — Z515 Encounter for palliative care: Secondary | ICD-10-CM

## 2020-02-23 NOTE — Progress Notes (Signed)
[facility] Consult Note Telephone: [phone]  Fax: [phone]  PATIENT NAME: Arthur [name] DOB: August 12, 1933 MRN: [id number]  PRIMARY CARE PROVIDER:   Baxter Hire, MD  REFERRING PROVIDER:  Baxter Hire, MD [place],  [place] [address]  RESPONSIBLE PARTY:[name] daughter [phone]  Due to the COVID-19 crisis, this visit was done via telemedicine from my office and it was initiated and consent by this patient and or family.  RECOMMENDATIONS and PLAN: 1.ACP: DNR  2.Dyspneic secondary toCOPD/CHF;remain stable at present time. ContinueO2, inhalers, inhalation therapy as needed.   3.Palliative care encounter Palliative medicine team will continue to support patient, patient's family, and medical team. Visit consisted of counseling and education dealing with the complex and emotionally intense issues of symptom management and palliative care in the setting of serious and potentially life-threatening illness  4. F/u 2 months for follow-up for chronic disease, symptom management as he continues to receive erythropoietin replacement/transfusions  I spent 40 minutes providing this consultation,  from 5:00pm  to 5:40pm. More than 50% of the time in this consultation was spent coordinating communication.   HISTORY OF PRESENT ILLNESS:  Arthur [name] is a 87 y.o. year old male with multiple medical problems including COPD, congestive heart failure, anemia of chronic disease, chronic kidney disease stage 3, diabetes, hypertension, hyperlipidemia, history of GI bleed, allergies, tobacco use, obesity. In-person visit change to telemedicine telephonic is video not available for Arthur [name]. I called [name], Arthur [name] daughter for follow up Palliative care visit. We talked about how Arthur [name] has been doing. Arthur [name] required blood transfusion. Arthur [name] will return to the cancer center on Friday of this week to  have Labs repeated and a possible second transfusion. We talked about Arthur [name]'s functional level, no recent falls. We talked about increasing weakness as Arthur [name] spends most of his time during the day in the chair. [name] endorses he does get up to get a cookie and realize that after his blood sugars have been running a little bit high and cookies were missing. Cristie Hem endorses she hides the cookies. We talked about pending Physical Therapy. [name] enodrses it has been ordered but they have not heard from anyone as of yet. We talked about medical goals of care. [name] endorses Dr Tasia Catchings Oncologist mention that the blood transfusions Arthur [name] could become dependent on them. We talked about blood transfusions, IV fluids and what the pattern looks like when one becomes dependent on interventions. We talked about overall it seems like Arthur [name] is doing well for his current clinical condition. We talked about family dynamics as her brother live behind Arthur. [name]. [name] endorses her brother does not help with care for Arthur. Chap it is only her. [name] endorses she does have to take off of work to go to doctors appointments do her work is understanding of their situation. Discussed with [name] will follow, monitor labs to see if he does require transfusion this week. We talked about ongoing discussions of medical goals of care. We talked about realistic expectations. Currently Arthur [name] remains asymptomatic. We talked about role of Palliative care and plan of care. Palliative care visit supportive, therapeutic listening, emotional support. Appointments scheduled. Questions answered to satisfaction. Contact information.  Palliative Care was asked to help to continue to address goals of care.   CODE STATUS: DNR  PPS: 40% HOSPICE ELIGIBILITY/DIAGNOSIS: TBD  PAST MEDICAL HISTORY:  Past Medical History:  Diagnosis  Date  . Anemia of chronic kidney failure 08/21/2018  . Arthritis    lower back  . CHF (congestive heart  failure) ([place])   . CKD (chronic kidney disease), stage III ([place])   . COPD (chronic obstructive pulmonary disease) ([place])   . Diabetes mellitus without complication ([place])    type 2  . Dyspnea    when active -wears oxygen 2L via [place]  . History of blood transfusion   . Hyperlipidemia   . Hypertension   . Requires continuous at home supplemental oxygen    2L via [place]  . Squamous cell carcinoma of skin 02/03/2020   R lat cheek, EDC    SOCIAL HX:  Social History   Tobacco Use  . Smoking status: Former Smoker    Packs/day: 2.00    Years: 35.00    Pack years: 70.00    Types: Cigarettes    Quit date: 11/04/1982    Years since quitting: 37.3  . Smokeless tobacco: Current User    Types: Chew  Substance Use Topics  . Alcohol use: Not Currently    ALLERGIES:  Allergies  Allergen Reactions  . Codeine Other (See Comments)    Constipation     PERTINENT MEDICATIONS:  Outpatient Encounter Medications as of 02/23/2020  Medication Sig  . acetaminophen (TYLENOL) 325 MG tablet Take 2 tablets (650 mg total) by mouth every 6 (six) hours as needed for mild pain (or Fever >/= 101).  Marland Kitchen albuterol (VENTOLIN HFA) 108 (90 Base) MCG/ACT inhaler Inhale 2 puffs into the lungs every 6 (six) hours as needed for wheezing or shortness of breath.  Marland Kitchen amLODipine (NORVASC) 2.5 MG tablet Take 2.5 mg by mouth daily.  Marland Kitchen ascorbic acid (VITAMIN C) 500 MG tablet Take 500 mg by mouth at bedtime.  . docusate sodium (COLACE) 100 MG capsule Take 1 capsule (100 mg total) by mouth 2 (two) times daily.  . insulin aspart (NOVOLOG) 100 UNIT/ML injection 4 units prior to meals if sugars above 200  . LEVEMIR 100 UNIT/ML injection Inject 0.04 mLs (4 Units total) into the skin at bedtime.  . metoCLOPramide (REGLAN) 5 MG tablet Take 5 mg by mouth 4 (four) times daily -  before meals and at bedtime.  Marland Kitchen nystatin cream (MYCOSTATIN) Apply topically.  . pantoprazole (PROTONIX) 40 MG tablet Take 1 tablet (40 mg total) by mouth daily.  .  polyethylene glycol powder (GLYCOLAX/MIRALAX) 17 GM/SCOOP powder Take 17 g by mouth every other day.   . potassium chloride (KLOR-CON) 10 MEQ tablet Take 10 mEq by mouth every evening.  . pravastatin (PRAVACHOL) 10 MG tablet Take 10 mg by mouth at bedtime.  . promethazine (PHENERGAN) 25 MG tablet Take 25 mg by mouth every 4 (four) hours as needed for nausea or vomiting.  . tamsulosin (FLOMAX) 0.4 MG CAPS capsule Take 0.4 mg by mouth daily.  Marland Kitchen tiotropium (SPIRIVA) 18 MCG inhalation capsule Place 18 mcg into inhaler and inhale daily.  Marland Kitchen torsemide (DEMADEX) 20 MG tablet Take 20 mg by mouth every Monday, Wednesday, and Friday.  . triamcinolone cream (KENALOG) 0.5 %   . TRUE METRIX BLOOD GLUCOSE TEST test strip 1 each by Other route every morning.   No facility-administered encounter medications on file as of 02/23/2020.    PHYSICAL EXAM:   Deferred  Lodema Parma Z Orean Giarratano, NP

## 2020-02-26 ENCOUNTER — Other Ambulatory Visit: Payer: Self-pay

## 2020-02-26 DIAGNOSIS — D649 Anemia, unspecified: Secondary | ICD-10-CM

## 2020-02-27 ENCOUNTER — Inpatient Hospital Stay: Payer: Medicare HMO

## 2020-02-27 ENCOUNTER — Encounter: Payer: Self-pay | Admitting: Oncology

## 2020-02-27 ENCOUNTER — Inpatient Hospital Stay (HOSPITAL_BASED_OUTPATIENT_CLINIC_OR_DEPARTMENT_OTHER): Payer: Medicare HMO | Admitting: Oncology

## 2020-02-27 ENCOUNTER — Ambulatory Visit
Admission: RE | Admit: 2020-02-27 | Discharge: 2020-02-27 | Disposition: A | Discharge status: Home / Self Care | Payer: Medicare HMO | Source: Ambulatory Visit | Attending: Oncology | Admitting: Oncology

## 2020-02-27 ENCOUNTER — Ambulatory Visit
Admission: RE | Admit: 2020-02-27 | Discharge: 2020-02-27 | Disposition: A | Discharge status: Home / Self Care | Payer: Medicare HMO | Attending: Oncology | Admitting: Oncology

## 2020-02-27 VITALS — BP 129/62 | HR 85 | Temp 98.5°F | Resp 16 | Wt 182.0 lb

## 2020-02-27 DIAGNOSIS — M25511 Pain in right shoulder: Secondary | ICD-10-CM | POA: Diagnosis not present

## 2020-02-27 DIAGNOSIS — D631 Anemia in chronic kidney disease: Secondary | ICD-10-CM | POA: Diagnosis not present

## 2020-02-27 DIAGNOSIS — I130 Hypertensive heart and chronic kidney disease with heart failure and stage 1 through stage 4 chronic kidney disease, or unspecified chronic kidney disease: Secondary | ICD-10-CM | POA: Diagnosis not present

## 2020-02-27 DIAGNOSIS — I509 Heart failure, unspecified: Secondary | ICD-10-CM | POA: Diagnosis not present

## 2020-02-27 DIAGNOSIS — D649 Anemia, unspecified: Secondary | ICD-10-CM | POA: Diagnosis not present

## 2020-02-27 DIAGNOSIS — N184 Chronic kidney disease, stage 4 (severe): Secondary | ICD-10-CM

## 2020-02-27 DIAGNOSIS — E119 Type 2 diabetes mellitus without complications: Secondary | ICD-10-CM | POA: Diagnosis not present

## 2020-02-27 DIAGNOSIS — N189 Chronic kidney disease, unspecified: Secondary | ICD-10-CM

## 2020-02-27 DIAGNOSIS — J449 Chronic obstructive pulmonary disease, unspecified: Secondary | ICD-10-CM | POA: Diagnosis not present

## 2020-02-27 DIAGNOSIS — M129 Arthropathy, unspecified: Secondary | ICD-10-CM | POA: Diagnosis not present

## 2020-02-27 DIAGNOSIS — Z79899 Other long term (current) drug therapy: Secondary | ICD-10-CM | POA: Diagnosis not present

## 2020-02-27 LAB — COMPREHENSIVE METABOLIC PANEL
ALT: 9 U/L (ref 0–44)
AST: 12 U/L — ABNORMAL LOW (ref 15–41)
Albumin: 3.9 g/dL (ref 3.5–5.0)
Alkaline Phosphatase: 61 U/L (ref 38–126)
Anion gap: 9 (ref 5–15)
BUN: 46 mg/dL — ABNORMAL HIGH (ref 8–23)
CO2: 24 mmol/L (ref 22–32)
Calcium: 8.2 mg/dL — ABNORMAL LOW (ref 8.9–10.3)
Chloride: 101 mmol/L (ref 98–111)
Creatinine, Ser: 2.05 mg/dL — ABNORMAL HIGH (ref 0.61–1.24)
GFR, Estimated: 31 mL/min — ABNORMAL LOW (ref >60)
Glucose, Bld: 225 mg/dL — ABNORMAL HIGH (ref 70–99)
Potassium: 4.1 mmol/L (ref 3.5–5.1)
Sodium: 134 mmol/L — ABNORMAL LOW (ref 135–145)
Total Bilirubin: 0.8 mg/dL (ref 0.3–1.2)
Total Protein: 7.2 g/dL (ref 6.5–8.1)

## 2020-02-27 LAB — ACID FAST CULTURE WITH REFLEXED SENSITIVITIES (MYCOBACTERIA): Acid Fast Culture: NEGATIVE

## 2020-02-27 LAB — HEMOGLOBIN AND HEMATOCRIT, BLOOD
HCT: 26.9 % — ABNORMAL LOW (ref 39.0–52.0)
Hemoglobin: 8.3 g/dL — ABNORMAL LOW (ref 13.0–17.0)

## 2020-02-27 LAB — SAMPLE TO BLOOD BANK

## 2020-02-27 MED ORDER — IRON SUCROSE 20 MG/ML IV SOLN
200.0000 mg | Freq: Once | INTRAVENOUS | Status: AC
  Administered 2020-02-27: 200 mg via INTRAVENOUS
  Filled 2020-02-27: qty 10

## 2020-02-27 MED ORDER — SODIUM CHLORIDE 0.9 % IV SOLN
INTRAVENOUS | Status: DC
  Filled 2020-02-27: qty 250

## 2020-02-27 MED ORDER — SODIUM CHLORIDE 0.9 % IV SOLN: 200.0000 mg | Freq: Once | INTRAVENOUS | Status: DC

## 2020-02-27 NOTE — Progress Notes (Signed)
Hematology/Oncology  [facility] Telephone:([phone] [phone]   Patient Care Team: Baxter Hire, MD as PCP - General (Internal Medicine) Earlie Server, MD as Consulting Physician (Oncology)  REFERRING PROVIDER: Baxter Hire, MD  CHIEF COMPLAINTS/REASON FOR VISIT:  Follow-up for anemia secondary to chronic kidney disease.  Possible MDS   INTERVAL HISTORY Arthur [name] is a 87 y.o. male who has above history reviewed by me today presents for follow up visit for management of anemia secondary to chronic kidney disease, possible underlying MDS Problems and complaints are listed below: Accompanied by his daughter.  Patient had a fall this morning and hurt it his right shoulder.  Shoulder pain with movement.  No new complaints.  Status post IV iron treatments and previous PRBC transfusions...    Review of Systems  Constitutional: Positive for fatigue. Negative for appetite change, chills, fever and unexpected weight change.  HENT:   Negative for hearing loss and voice change.   Eyes: Negative for eye problems and icterus.  Respiratory: Negative for chest tightness, cough and shortness of breath.   Cardiovascular: Negative for chest pain and leg swelling.  Gastrointestinal: Negative for abdominal distention and abdominal pain.  Endocrine: Negative for hot flashes.  Genitourinary: Negative for difficulty urinating, dysuria and frequency.   Musculoskeletal: Negative for arthralgias.       Chronic back pain  Skin: Negative for itching and rash.  Neurological: Negative for light-headedness and numbness.  Hematological: Negative for adenopathy. Does not bruise/bleed easily.  Psychiatric/Behavioral: Negative for confusion.    MEDICAL HISTORY:  Past Medical History:  Diagnosis Date  . Anemia of chronic kidney failure 08/21/2018  . Arthritis    lower back  . CHF (congestive heart failure) ([place])   . CKD (chronic kidney disease), stage III ([place])   .  COPD (chronic obstructive pulmonary disease) ([place])   . Diabetes mellitus without complication ([place])    type 2  . Dyspnea    when active -wears oxygen 2L via [place]  . History of blood transfusion   . Hyperlipidemia   . Hypertension   . Requires continuous at home supplemental oxygen    2L via [place]  . Squamous cell carcinoma of skin 02/03/2020   R lat cheek, EDC    SURGICAL HISTORY: Past Surgical History:  Procedure Laterality Date  . COLONOSCOPY WITH PROPOFOL N/A 06/11/2018   Procedure: COLONOSCOPY WITH PROPOFOL;  Surgeon: Jonathon Bellows, MD;  Location: [facility] ENDOSCOPY;  Service: Gastroenterology;  Laterality: N/A;  . COLONOSCOPY WITH PROPOFOL N/A 06/13/2018   Procedure: COLONOSCOPY WITH PROPOFOL;  Surgeon: Jonathon Bellows, MD;  Location: [facility] ENDOSCOPY;  Service: Gastroenterology;  Laterality: N/A;  . ESOPHAGOGASTRODUODENOSCOPY Left 06/10/2018   Procedure: ESOPHAGOGASTRODUODENOSCOPY (EGD);  Surgeon: Jonathon Bellows, MD;  Location: [facility] ENDOSCOPY;  Service: Gastroenterology;  Laterality: Left;  . EYE SURGERY     removed cataracts  . GIVENS CAPSULE STUDY N/A 06/28/2018   Procedure: GIVENS CAPSULE STUDY;  Surgeon: Lucilla Lame, MD;  Location: [facility] ENDOSCOPY;  Service: Endoscopy;  Laterality: N/A;  . GIVENS CAPSULE STUDY N/A 06/30/2018   Procedure: GIVENS CAPSULE STUDY;  Surgeon: Jonathon Bellows, MD;  Location: [facility] ENDOSCOPY;  Service: Gastroenterology;  Laterality: N/A;  . KYPHOPLASTY N/A 10/31/2018   Procedure: KYPHOPLASTY L2;  Surgeon: [name], MD;  Location: [facility] ORS;  Service: Orthopedics;  Laterality: N/A;  . KYPHOPLASTY N/A 12/24/2018   Procedure: KYPHOPLASTY L3;  Surgeon: [name], MD;  Location: [facility] ORS;  Service: Orthopedics;  Laterality: N/A;  . KYPHOPLASTY  N/A 02/18/2019   Procedure: L2 KYPHOPLASTY;  Surgeon: [name], MD;  Location: [facility] ORS;  Service: Orthopedics;  Laterality: N/A;  . RADIOLOGY WITH ANESTHESIA N/A 01/28/2019   Procedure: MRI LUMBER SPINE WITHOUT CONTRAST;  Surgeon:  Radiologist, Medication, MD;  Location: [facility];  Service: Radiology;  Laterality: N/A;    SOCIAL HISTORY: Social History   Socioeconomic History  . Marital status: Widowed    Spouse name: Not on file  . Number of children: Not on file  . Years of education: Not on file  . Highest education level: Not on file  Occupational History  . Not on file  Tobacco Use  . Smoking status: Former Smoker    Packs/day: 2.00    Years: 35.00    Pack years: 70.00    Types: Cigarettes    Quit date: 11/04/1982    Years since quitting: 37.3  . Smokeless tobacco: Current User    Types: Chew  Vaping Use  . Vaping Use: Never used  Substance and Sexual Activity  . Alcohol use: Not Currently  . Drug use: No  . Sexual activity: Not Currently  Other Topics Concern  . Not on file  Social History Narrative  . Not on file   Social Determinants of Health   Financial Resource Strain: Not on file  Food Insecurity: Not on file  Transportation Needs: Not on file  Physical Activity: Not on file  Stress: Not on file  Social Connections: Not on file  Intimate Partner Violence: Not on file    FAMILY HISTORY: Family History  Problem Relation Age of Onset  . COPD Mother   . Cancer Father     ALLERGIES:  is allergic to codeine.  MEDICATIONS:  Current Outpatient Medications  Medication Sig Dispense Refill  . acetaminophen (TYLENOL) 325 MG tablet Take 2 tablets (650 mg total) by mouth every 6 (six) hours as needed for mild pain (or Fever >/= 101).    Marland Kitchen albuterol (VENTOLIN HFA) 108 (90 Base) MCG/ACT inhaler Inhale 2 puffs into the lungs every 6 (six) hours as needed for wheezing or shortness of breath. 1 Inhaler 2  . amLODipine (NORVASC) 2.5 MG tablet Take 2.5 mg by mouth daily.    Marland Kitchen ascorbic acid (VITAMIN C) 500 MG tablet Take 500 mg by mouth at bedtime.    . docusate sodium (COLACE) 100 MG capsule Take 1 capsule (100 mg total) by mouth 2 (two) times daily. 60 capsule 0  . insulin aspart (NOVOLOG) 100  UNIT/ML injection 4 units prior to meals if sugars above 200 10 mL 11  . LEVEMIR 100 UNIT/ML injection Inject 0.04 mLs (4 Units total) into the skin at bedtime. 10 mL 0  . metoCLOPramide (REGLAN) 5 MG tablet Take 5 mg by mouth 4 (four) times daily -  before meals and at bedtime.    Marland Kitchen nystatin cream (MYCOSTATIN) Apply topically.    . pantoprazole (PROTONIX) 40 MG tablet Take 1 tablet (40 mg total) by mouth daily.    . polyethylene glycol powder (GLYCOLAX/MIRALAX) 17 GM/SCOOP powder Take 17 g by mouth every other day.     . potassium chloride (KLOR-CON) 10 MEQ tablet Take 10 mEq by mouth every evening.    . pravastatin (PRAVACHOL) 10 MG tablet Take 10 mg by mouth at bedtime.    . promethazine (PHENERGAN) 25 MG tablet Take 25 mg by mouth every 4 (four) hours as needed for nausea or vomiting.    . tamsulosin (FLOMAX) 0.4 MG CAPS capsule  Take 0.4 mg by mouth daily.    Marland Kitchen tiotropium (SPIRIVA) 18 MCG inhalation capsule Place 18 mcg into inhaler and inhale daily.    Marland Kitchen torsemide (DEMADEX) 20 MG tablet Take 20 mg by mouth every Monday, Wednesday, and Friday.    . triamcinolone cream (KENALOG) 0.5 %     . TRUE METRIX BLOOD GLUCOSE TEST test strip 1 each by Other route every morning.     No current facility-administered medications for this visit.     PHYSICAL EXAMINATION: ECOG PERFORMANCE STATUS: 3 - Symptomatic, >50% confined to bed Vitals:   02/27/20 0832  BP: 129/62  Pulse: 85  Resp: 16  Temp: 98.5 F (36.9 C)   Filed Weights   02/27/20 0832  Weight: 182 lb (82.6 kg)    Physical Exam Constitutional:      General: He is not in acute distress.    Appearance: He is ill-appearing.     Comments: Sits in wheel chair  HENT:     Head: Normocephalic and atraumatic.  Eyes:     General: No scleral icterus.    Pupils: Pupils are equal, round, and reactive to light.  Cardiovascular:     Rate and Rhythm: Normal rate and regular rhythm.     Heart sounds: Normal heart sounds.  Pulmonary:      Effort: Pulmonary effort is normal. No respiratory distress.     Breath sounds: No wheezing.     Comments:  Decreased breath sounds at right base Abdominal:     General: Bowel sounds are normal. There is no distension.     Palpations: Abdomen is soft. There is no mass.     Tenderness: There is no abdominal tenderness.  Musculoskeletal:        General: No deformity.     Cervical back: Normal range of motion and neck supple.     Comments: Chronic bilateral lower extremity edema.  Right shoulder tenderness with movement.  Skin:    General: Skin is warm and dry.     Coloration: Skin is not pale.     Findings: No erythema or rash.  Neurological:     Mental Status: He is alert. Mental status is at baseline.     Cranial Nerves: No cranial nerve deficit.     Coordination: Coordination normal.  Psychiatric:        Mood and Affect: Mood normal.     LABORATORY DATA:  I have reviewed the data as listed Lab Results  Component Value Date   WBC 5.6 02/13/2020   HGB 8.3 (L) 02/27/2020   HCT 26.9 (L) 02/27/2020   MCV 93.7 02/13/2020   PLT 243 02/13/2020   Recent Labs    03/10/19 0538 07/23/19 1535 08/14/19 1528 02/13/20 1035 02/27/20 0807  NA 130* 134* 138 137 134*  K 3.9 4.0 4.9 4.3 4.1  CL 93* 99 107 103 101  CO2 32 25 26 25 24   GLUCOSE 116* 143* 154* 215* 225*  BUN 27* 41* 46* 48* 46*  CREATININE 1.59* 2.22* 1.98* 2.19* 2.05*  CALCIUM 8.3* 8.4* 8.2* 8.3* 8.2*  GFRNONAA 40* 26* 30* 29* 31*  GFRAA 46* 30* 35*  --   --   PROT  --  7.7 7.4 6.6 7.2  ALBUMIN  --  3.7 3.7 3.6 3.9  AST  --  9* 10* 11* 12*  ALT  --  8 8 7 9   ALKPHOS  --  65 59 55 61  BILITOT  --  0.6 0.6 0.7  0.8   Iron/TIBC/Ferritin/ %Sat    Component Value Date/Time   IRON 68 02/13/2020 1035   IRON 36 (L) 07/15/2018 1544   TIBC 157 (L) 02/13/2020 1035   TIBC 172 (L) 07/15/2018 1544   FERRITIN 638 (H) 02/13/2020 1035   FERRITIN 535 (H) 07/15/2018 1544   IRONPCTSAT 43 (H) 02/13/2020 1035   IRONPCTSAT 21  07/15/2018 1544      RADIOGRAPHIC STUDIES: I have personally reviewed the radiological images as listed and agreed with the findings in the report. No results found.    ASSESSMENT & PLAN:  1. Symptomatic anemia   2. Anemia of chronic renal failure, stage 4 (severe) (HCC)   3. Pain in joint of right shoulder     #Anemia of chronic kidney disease,  Suspect MDS cannot be ruled out.  Patient declines bone marrow biopsy due to multiple other medical problems. Patient has been on Retacrit 60,000 units monthly. Status post IV Venofer treatments and PRBC transfusion. Labs are reviewed and discussed with patient and daughter Hemoglobin has improved to 8.3. No need for blood transfusion today. Recommend patient to receive another IV Venofer 200 mg x 1 today. Patient to follow-up in 2 weeks, lab, MD, +/- Retacrit +/- blood transfusion.  I discussed with patient and daughter about the possibility of patient becoming transfusion dependent due to underlying disease progression.    Status post fall, right shoulder pain.  I will obtain x-ray for further evaluation.  All questions were answered. The patient knows to call the clinic with any problems questions or concerns.   Earlie Server, MD, PhD Hematology Oncology [facility] Pager- [phone] 02/27/2020

## 2020-02-27 NOTE — Progress Notes (Signed)
Patient had a fall this morning and has right arm pain.

## 2020-02-28 DIAGNOSIS — J441 Chronic obstructive pulmonary disease with (acute) exacerbation: Secondary | ICD-10-CM | POA: Diagnosis not present

## 2020-03-01 ENCOUNTER — Telehealth: Payer: Self-pay

## 2020-03-01 DIAGNOSIS — M25511 Pain in right shoulder: Secondary | ICD-10-CM

## 2020-03-01 NOTE — Telephone Encounter (Signed)
-----   Message from Earlie Server, MD sent at 03/01/2020  8:38 AM EST ----- Let him and daughter know that xray showed right shoulder fracture. Advise him to contact PCP and be referred to orthopedic surgeon for evaluation.

## 2020-03-01 NOTE — Telephone Encounter (Signed)
Arthur [name] notified of results.  She prefers that [name] go ahead and send for referral.  He is established with Dr. Rudene Christians, will send information to that office.

## 2020-03-02 DIAGNOSIS — J441 Chronic obstructive pulmonary disease with (acute) exacerbation: Secondary | ICD-10-CM | POA: Diagnosis not present

## 2020-03-03 NOTE — Telephone Encounter (Signed)
Confirmed with Dr. Theodore Demark office referral has been received.

## 2020-03-09 DIAGNOSIS — M25511 Pain in right shoulder: Secondary | ICD-10-CM | POA: Diagnosis not present

## 2020-03-09 DIAGNOSIS — E1142 Type 2 diabetes mellitus with diabetic polyneuropathy: Secondary | ICD-10-CM | POA: Diagnosis not present

## 2020-03-12 ENCOUNTER — Ambulatory Visit: Payer: Medicare HMO | Admitting: Oncology

## 2020-03-12 ENCOUNTER — Other Ambulatory Visit: Payer: Medicare HMO

## 2020-03-12 ENCOUNTER — Ambulatory Visit: Payer: Medicare HMO

## 2020-03-16 ENCOUNTER — Inpatient Hospital Stay: Payer: Medicare HMO | Attending: Oncology

## 2020-03-16 ENCOUNTER — Encounter: Payer: Self-pay | Admitting: Nurse Practitioner

## 2020-03-16 ENCOUNTER — Other Ambulatory Visit: Payer: Medicare HMO | Admitting: Nurse Practitioner

## 2020-03-16 ENCOUNTER — Inpatient Hospital Stay: Payer: Medicare HMO

## 2020-03-16 ENCOUNTER — Inpatient Hospital Stay (HOSPITAL_BASED_OUTPATIENT_CLINIC_OR_DEPARTMENT_OTHER): Payer: Medicare HMO | Admitting: Oncology

## 2020-03-16 ENCOUNTER — Other Ambulatory Visit: Payer: Self-pay

## 2020-03-16 ENCOUNTER — Encounter: Payer: Self-pay | Admitting: Oncology

## 2020-03-16 VITALS — BP 134/63 | HR 73 | Temp 98.5°F | Resp 16

## 2020-03-16 DIAGNOSIS — E785 Hyperlipidemia, unspecified: Secondary | ICD-10-CM | POA: Insufficient documentation

## 2020-03-16 DIAGNOSIS — J449 Chronic obstructive pulmonary disease, unspecified: Secondary | ICD-10-CM | POA: Insufficient documentation

## 2020-03-16 DIAGNOSIS — N189 Chronic kidney disease, unspecified: Secondary | ICD-10-CM

## 2020-03-16 DIAGNOSIS — Z85828 Personal history of other malignant neoplasm of skin: Secondary | ICD-10-CM | POA: Diagnosis not present

## 2020-03-16 DIAGNOSIS — I129 Hypertensive chronic kidney disease with stage 1 through stage 4 chronic kidney disease, or unspecified chronic kidney disease: Secondary | ICD-10-CM | POA: Insufficient documentation

## 2020-03-16 DIAGNOSIS — D509 Iron deficiency anemia, unspecified: Secondary | ICD-10-CM | POA: Diagnosis not present

## 2020-03-16 DIAGNOSIS — I509 Heart failure, unspecified: Secondary | ICD-10-CM | POA: Diagnosis not present

## 2020-03-16 DIAGNOSIS — D649 Anemia, unspecified: Secondary | ICD-10-CM

## 2020-03-16 DIAGNOSIS — Z515 Encounter for palliative care: Secondary | ICD-10-CM | POA: Diagnosis not present

## 2020-03-16 DIAGNOSIS — N184 Chronic kidney disease, stage 4 (severe): Secondary | ICD-10-CM

## 2020-03-16 DIAGNOSIS — R609 Edema, unspecified: Secondary | ICD-10-CM | POA: Insufficient documentation

## 2020-03-16 DIAGNOSIS — Z79899 Other long term (current) drug therapy: Secondary | ICD-10-CM | POA: Diagnosis not present

## 2020-03-16 DIAGNOSIS — D631 Anemia in chronic kidney disease: Secondary | ICD-10-CM

## 2020-03-16 DIAGNOSIS — E611 Iron deficiency: Secondary | ICD-10-CM | POA: Diagnosis not present

## 2020-03-16 DIAGNOSIS — Z87891 Personal history of nicotine dependence: Secondary | ICD-10-CM | POA: Diagnosis not present

## 2020-03-16 DIAGNOSIS — N183 Chronic kidney disease, stage 3 unspecified: Secondary | ICD-10-CM | POA: Diagnosis not present

## 2020-03-16 DIAGNOSIS — E119 Type 2 diabetes mellitus without complications: Secondary | ICD-10-CM | POA: Insufficient documentation

## 2020-03-16 LAB — RETIC PANEL
Immature Retic Fract: 17.5 % — ABNORMAL HIGH (ref 2.3–15.9)
RBC.: 2.61 MIL/uL — ABNORMAL LOW (ref 4.22–5.81)
Retic Count, Absolute: 18.8 10*3/uL — ABNORMAL LOW (ref 19.0–186.0)
Retic Ct Pct: 0.7 % (ref 0.4–3.1)
Reticulocyte Hemoglobin: 27.1 pg — ABNORMAL LOW (ref >27.9)

## 2020-03-16 LAB — SAMPLE TO BLOOD BANK

## 2020-03-16 LAB — CBC WITH DIFFERENTIAL/PLATELET
Abs Immature Granulocytes: 0.03 10*3/uL (ref 0.00–0.07)
Basophils Absolute: 0.1 10*3/uL (ref 0.0–0.1)
Basophils Relative: 1 %
Eosinophils Absolute: 0.2 10*3/uL (ref 0.0–0.5)
Eosinophils Relative: 3 %
HCT: 24.6 % — ABNORMAL LOW (ref 39.0–52.0)
Hemoglobin: 7.6 g/dL — ABNORMAL LOW (ref 13.0–17.0)
Immature Granulocytes: 0 %
Lymphocytes Relative: 17 %
Lymphs Abs: 1.3 10*3/uL (ref 0.7–4.0)
MCH: 29.2 pg (ref 26.0–34.0)
MCHC: 30.9 g/dL (ref 30.0–36.0)
MCV: 94.6 fL (ref 80.0–100.0)
Monocytes Absolute: 0.8 10*3/uL (ref 0.1–1.0)
Monocytes Relative: 10 %
Neutro Abs: 5.2 10*3/uL (ref 1.7–7.7)
Neutrophils Relative %: 69 %
Platelets: 295 10*3/uL (ref 150–400)
RBC: 2.6 MIL/uL — ABNORMAL LOW (ref 4.22–5.81)
RDW: 19.6 % — ABNORMAL HIGH (ref 11.5–15.5)
WBC: 7.6 10*3/uL (ref 4.0–10.5)
nRBC: 0 % (ref 0.0–0.2)

## 2020-03-16 MED ORDER — EPOETIN ALFA-EPBX 40000 UNIT/ML IJ SOLN: 60000.0000 [IU] | Freq: Once | INTRAMUSCULAR | Status: DC

## 2020-03-16 MED ORDER — EPOETIN ALFA-EPBX 40000 UNIT/ML IJ SOLN
40000.0000 [IU] | Freq: Once | INTRAMUSCULAR | Status: AC
Start: 2020-03-16 — End: 2020-03-16
  Administered 2020-03-16: 40000 [IU] via SUBCUTANEOUS
  Filled 2020-03-16: qty 1

## 2020-03-16 MED ORDER — EPOETIN ALFA-EPBX 10000 UNIT/ML IJ SOLN
20000.0000 [IU] | Freq: Once | INTRAMUSCULAR | Status: AC
Start: 2020-03-16 — End: 2020-03-16
  Administered 2020-03-16: 20000 [IU] via SUBCUTANEOUS
  Filled 2020-03-16: qty 2

## 2020-03-16 NOTE — Progress Notes (Signed)
Patient denies new problems/concerns today.   °

## 2020-03-16 NOTE — Progress Notes (Signed)
Hematology/Oncology  [facility] Telephone:([phone] Fax:([phone]   Patient Care Team: Baxter Hire, MD as PCP - General (Internal Medicine) Earlie Server, MD as Consulting Physician (Oncology)  REFERRING PROVIDER: Baxter Hire, MD  CHIEF COMPLAINTS/REASON FOR VISIT:  Follow-up for anemia secondary to chronic kidney disease.  Possible MDS   INTERVAL HISTORY Arthur [name] is a 87 y.o. male who has above history reviewed by me today presents for follow up visit for management of anemia secondary to chronic kidney disease, possible underlying MDS Problems and complaints are listed below: Accompanied by his daughter.  Status post blood transfusion 2 weeks ago.  Today hemoglobin is 7.6.  Patient feels okay. Denies any black stool or bloody stool.  Chronic fatigue, unchanged. Right shoulder pain has improved.  He was seen by orthopedic surgeon since last visit for possible fracture.  4 views of the right shoulder x-ray was obtained by orthopedic surgeon and no fracture.  Review of Systems  Constitutional: Positive for fatigue. Negative for appetite change, chills, fever and unexpected weight change.  HENT:   Negative for hearing loss and voice change.   Eyes: Negative for eye problems and icterus.  Respiratory: Negative for chest tightness, cough and shortness of breath.   Cardiovascular: Negative for chest pain and leg swelling.  Gastrointestinal: Negative for abdominal distention and abdominal pain.  Endocrine: Negative for hot flashes.  Genitourinary: Negative for difficulty urinating, dysuria and frequency.   Musculoskeletal: Negative for arthralgias.       Chronic back pain  Skin: Negative for itching and rash.  Neurological: Negative for light-headedness and numbness.  Hematological: Negative for adenopathy. Does not bruise/bleed easily.  Psychiatric/Behavioral: Negative for confusion.    MEDICAL HISTORY:  Past Medical History:  Diagnosis  Date  . Anemia of chronic kidney failure 08/21/2018  . Arthritis    lower back  . CHF (congestive heart failure) ([place])   . CKD (chronic kidney disease), stage III ([place])   . COPD (chronic obstructive pulmonary disease) ([place])   . Diabetes mellitus without complication ([place])    type 2  . Dyspnea    when active -wears oxygen 2L via [place]  . History of blood transfusion   . Hyperlipidemia   . Hypertension   . Requires continuous at home supplemental oxygen    2L via [place]  . Squamous cell carcinoma of skin 02/03/2020   R lat cheek, ED[facility]    SURGICAL HISTORY: Past Surgical History:  Procedure Laterality Date  . COLONOSCOPY WITH PROPOFOL N/A 06/11/2018   Procedure: COLONOSCOPY WITH PROPOFOL;  Surgeon: Jonathon Bellows, MD;  Location: [facility] ENDOSCOPY;  Service: Gastroenterology;  Laterality: N/A;  . COLONOSCOPY WITH PROPOFOL N/A 06/13/2018   Procedure: COLONOSCOPY WITH PROPOFOL;  Surgeon: Jonathon Bellows, MD;  Location: [facility] ENDOSCOPY;  Service: Gastroenterology;  Laterality: N/A;  . ESOPHAGOGASTRODUODENOSCOPY Left 06/10/2018   Procedure: ESOPHAGOGASTRODUODENOSCOPY (EGD);  Surgeon: Jonathon Bellows, MD;  Location: [facility] ENDOSCOPY;  Service: Gastroenterology;  Laterality: Left;  . EYE SURGERY     removed cataracts  . GIVENS CAPSULE STUDY N/A 06/28/2018   Procedure: GIVENS CAPSULE STUDY;  Surgeon: Lucilla Lame, MD;  Location: [facility] ENDOSCOPY;  Service: Endoscopy;  Laterality: N/A;  . GIVENS CAPSULE STUDY N/A 06/30/2018   Procedure: GIVENS CAPSULE STUDY;  Surgeon: Jonathon Bellows, MD;  Location: [facility] ENDOSCOPY;  Service: Gastroenterology;  Laterality: N/A;  . KYPHOPLASTY N/A 10/31/2018   Procedure: KYPHOPLASTY L2;  Surgeon: [name], MD;  Location: [facility] ORS;  Service: Orthopedics;  Laterality: N/A;  .  KYPHOPLASTY N/A 12/24/2018   Procedure: KYPHOPLASTY L3;  Surgeon: [name], MD;  Location: [facility] ORS;  Service: Orthopedics;  Laterality: N/A;  . KYPHOPLASTY N/A 02/18/2019   Procedure: L2 KYPHOPLASTY;  Surgeon: [name], MD;  Location: [facility] ORS;  Service: Orthopedics;  Laterality: N/A;  . RADIOLOGY WITH ANESTHESIA N/A 01/28/2019   Procedure: MRI LUMBER SPINE WITHOUT CONTRAST;  Surgeon: Radiologist, Medication, MD;  Location: [place];  Service: Radiology;  Laterality: N/A;    SOCIAL HISTORY: Social History   Socioeconomic History  . Marital status: Widowed    Spouse name: Not on file  . Number of children: Not on file  . Years of education: Not on file  . Highest education level: Not on file  Occupational History  . Not on file  Tobacco Use  . Smoking status: Former Smoker    Packs/day: 2.00    Years: 35.00    Pack years: 70.00    Types: Cigarettes    Quit date: 11/04/1982    Years since quitting: 37.3  . Smokeless tobacco: Current User    Types: Chew  Vaping Use  . Vaping Use: Never used  Substance and Sexual Activity  . Alcohol use: Not Currently  . Drug use: No  . Sexual activity: Not Currently  Other Topics Concern  . Not on file  Social History Narrative  . Not on file   Social Determinants of Health   Financial Resource Strain: Not on file  Food Insecurity: Not on file  Transportation Needs: Not on file  Physical Activity: Not on file  Stress: Not on file  Social Connections: Not on file  Intimate Partner Violence: Not on file    FAMILY HISTORY: Family History  Problem Relation Age of Onset  . COPD Mother   . Cancer Father     ALLERGIES:  is allergic to codeine.  MEDICATIONS:  Current Outpatient Medications  Medication Sig Dispense Refill  . acetaminophen (TYLENOL) 325 MG tablet Take 2 tablets (650 mg total) by mouth every 6 (six) hours as needed for mild pain (or Fever >/= 101).    Marland Kitchen albuterol (VENTOLIN HFA) 108 (90 Base) MCG/ACT inhaler Inhale 2 puffs into the lungs every 6 (six) hours as needed for wheezing or shortness of breath. 1 Inhaler 2  . amLODipine (NORVASC) 2.5 MG tablet Take 2.5 mg by mouth daily.    Marland Kitchen ascorbic acid (VITAMIN C) 500 MG tablet Take  500 mg by mouth at bedtime.    . docusate sodium (COLACE) 100 MG capsule Take 1 capsule (100 mg total) by mouth 2 (two) times daily. 60 capsule 0  . insulin aspart (NOVOLOG) 100 UNIT/ML injection 4 units prior to meals if sugars above 200 10 mL 11  . LEVEMIR 100 UNIT/ML injection Inject 0.04 mLs (4 Units total) into the skin at bedtime. 10 mL 0  . metoCLOPramide (REGLAN) 5 MG tablet Take 5 mg by mouth 4 (four) times daily -  before meals and at bedtime.    Marland Kitchen nystatin cream (MYCOSTATIN) Apply topically.    . pantoprazole (PROTONIX) 40 MG tablet Take 1 tablet (40 mg total) by mouth daily.    . polyethylene glycol powder (GLYCOLAX/MIRALAX) 17 GM/SCOOP powder Take 17 g by mouth every other day.     . potassium chloride (KLOR-CON) 10 MEQ tablet Take 10 mEq by mouth every evening.    . pravastatin (PRAVACHOL) 10 MG tablet Take 10 mg by mouth at bedtime.    . promethazine (PHENERGAN) 25 MG  tablet Take 25 mg by mouth every 4 (four) hours as needed for nausea or vomiting.    . tamsulosin (FLOMAX) 0.4 MG CAPS capsule Take 0.4 mg by mouth daily.    Marland Kitchen tiotropium (SPIRIVA) 18 MCG inhalation capsule Place 18 mcg into inhaler and inhale daily.    Marland Kitchen torsemide (DEMADEX) 20 MG tablet Take 20 mg by mouth every Monday, Wednesday, and Friday.    . triamcinolone cream (KENALOG) 0.5 %     . TRUE METRIX BLOOD GLUCOSE TEST test strip 1 each by Other route every morning.     No current facility-administered medications for this visit.     PHYSICAL EXAMINATION: ECOG PERFORMANCE STATUS: 3 - Symptomatic, >50% confined to bed Vitals:   03/16/20 0955  BP: 134/63  Pulse: 73  Resp: 16  Temp: 98.5 F (36.9 C)   There were no vitals filed for this visit.  Physical Exam Constitutional:      General: He is not in acute distress.    Appearance: He is ill-appearing.     Comments: Sits in wheel chair  HENT:     Head: Normocephalic and atraumatic.  Eyes:     General: No scleral icterus.    Pupils: Pupils are equal,  round, and reactive to light.  Cardiovascular:     Rate and Rhythm: Normal rate and regular rhythm.     Heart sounds: Normal heart sounds.  Pulmonary:     Effort: Pulmonary effort is normal. No respiratory distress.     Breath sounds: No wheezing.     Comments:  Decreased breath sounds at right base Abdominal:     General: Bowel sounds are normal. There is no distension.     Palpations: Abdomen is soft. There is no mass.     Tenderness: There is no abdominal tenderness.  Musculoskeletal:        General: No deformity.     Cervical back: Normal range of motion and neck supple.     Comments: Chronic bilateral lower extremity edema.  Right shoulder tenderness with movement.  Skin:    General: Skin is warm and dry.     Coloration: Skin is not pale.     Findings: No erythema or rash.  Neurological:     Mental Status: He is alert. Mental status is at baseline.     Cranial Nerves: No cranial nerve deficit.     Coordination: Coordination normal.  Psychiatric:        Mood and Affect: Mood normal.     LABORATORY DATA:  I have reviewed the data as listed Lab Results  Component Value Date   WBC 7.6 03/16/2020   HGB 7.6 (L) 03/16/2020   HCT 24.6 (L) 03/16/2020   MCV 94.6 03/16/2020   PLT 295 03/16/2020   Recent Labs    07/23/19 1535 08/14/19 1528 02/13/20 1035 02/27/20 0807  NA 134* 138 137 134*  K 4.0 4.9 4.3 4.1  CL 99 107 103 101  CO2 25 26 25 24   GLUCOSE 143* 154* 215* 225*  BUN 41* 46* 48* 46*  CREATININE 2.22* 1.98* 2.19* 2.05*  CALCIUM 8.4* 8.2* 8.3* 8.2*  GFRNONAA 26* 30* 29* 31*  GFRAA 30* 35*  --   --   PROT 7.7 7.4 6.6 7.2  ALBUMIN 3.7 3.7 3.6 3.9  AST 9* 10* 11* 12*  ALT 8 8 7 9   ALKPHOS 65 59 55 61  BILITOT 0.6 0.6 0.7 0.8   Iron/TIBC/Ferritin/ %Sat    Component Value  Date/Time   IRON 68 02/13/2020 1035   IRON 36 (L) 07/15/2018 1544   TIBC 157 (L) 02/13/2020 1035   TIBC 172 (L) 07/15/2018 1544   FERRITIN 638 (H) 02/13/2020 1035   FERRITIN 535 (H)  07/15/2018 1544   IRONPCTSAT 43 (H) 02/13/2020 1035   IRONPCTSAT 21 07/15/2018 1544      RADIOGRAPHIC STUDIES: I have personally reviewed the radiological images as listed and agreed with the findings in the report. DG Shoulder Right  Result Date: 02/28/2020 CLINICAL DATA:  Right shoulder pain status post fall. EXAM: RIGHT SHOULDER - 2+ VIEW COMPARISON:  None. FINDINGS: Generalized osteopenia. Cortical irregularity along the periphery of the humeral head near the greater tuberosity concerning for a nondisplaced fracture. No other fracture or dislocation. Moderate arthropathy of the acromioclavicular joint. Soft tissues are unremarkable. IMPRESSION: Cortical irregularity along the periphery of the humeral head near the greater tuberosity concerning for a nondisplaced fracture. If there is further clinical concern, recommend a CT of the right shoulder without intravenous contrast. Electronically Signed   By: Kathreen Devoid   On: 02/28/2020 12:59      ASSESSMENT & PLAN:  1. Anemia of chronic renal failure, stage 4 (severe) ([place])   2. Iron deficiency anemia, unspecified iron deficiency anemia type     #Anemia of chronic kidney disease,  Suspect MDS cannot be ruled out.  Patient declines bone marrow biopsy due to multiple other medical problems. Labs are reviewed and discussed with patient. Proceed with Retacrit 60,000 unit x 1 today. Hemoglobin 7.6 Possible MDS versus ongoing GI blood loss. Patient has had extensive gastroenterology work-up in 2020.  Reticulocyte panel remains low but slightly improved. Recommend patient to proceed IV Venofer weekly x2.  Follow-up in the clinic in 2 weeks for lab MD +/- IV Venofer treatment +/- blood transfusion All questions were answered. The patient knows to call the clinic with any problems questions or concerns.   Earlie Server, MD, PhD Hematology Oncology [facility] Pager- [phone] 03/16/2020

## 2020-03-16 NOTE — Progress Notes (Signed)
[facility] Consult Note Telephone: [phone]  Fax: [phone]  PATIENT NAME: Arthur [name] DOB: [birth year] MRN: [id number]  PRIMARY CARE PROVIDER:   Baxter Hire, MD  REFERRING PROVIDER:  Baxter Hire, MD [place],  [place] [address]  RESPONSIBLE PARTY:   [name] daughter [phone]  RECOMMENDATIONS and PLAN: 1.ACP: DNR  2.Dyspneic secondary toCOPD/CHF;remain stable at present time. Continue inhalers, inhalation therapy as needed.   3.Palliative care encounter Palliative medicine team will continue to support patient, patient's family, and medical team. Visit consisted of counseling and education dealing with the complex and emotionally intense issues of symptom management and palliative care in the setting of serious and potentially life-threatening illness  4. F/u 2 months for follow-up for chronic disease, symptom management as he continues to receiveerythropoietin replacement/transfusions  I spent 50 minutes providing this consultation,  from 4:30pm to 5:20pm. More than 50% of the time in this consultation was spent coordinating communication.   HISTORY OF PRESENT ILLNESS:  SLYVESTER [name] is a 87 y.o. year old male with multiple medical problems including COPD, congestive heart failure, anemia of chronic disease, chronic kidney disease stage 3, diabetes, hypertension, hyperlipidemia, history of GI bleed, allergies, tobacco use, obesity.I called [name], [name] daughter to confirm PC f/u in person visit and negative covid screening. I visited [name] and [name] in their home. [name] was sitting in the recliner with his feet up. [name] appears comfortable. We talked about purpose of palliative care visit. [name] in agreement. We talked about symptoms of pain, dyspnea which [name] denies. [name] endorses he is tired and easily gets worn out. We talked recent visit with Dr Tasia Catchings  Oncologist receiving transfusion, plan for iron infusion the next 2 weeks. We talked about the possibility of MDS with requirement of dx bone marrow biopsy which [name] endorses he did not want to go through that procedure. We talked about the information it would provide. We talked about possibility of GI bleed with hemoglobin dropping and difficulty holding with transfusion. [name] endorses [name] had a extensive GI workup in 2020 without any sign of bleeding. We talked about what it would be putting [name] through a colonoscopy. We talked about appetite, blood sugars. We talked about no recent falls, wounds, hospitalizations, infections. We talked about last visit with Dr Edwina Barth. We talked about medical goals of care. We talked about DNR which is on their refrigerator. We talked about upcoming multiple appointments with Dr Edwina Barth Primary, Dr Tasia Catchings Oncology, Pulmonology. [name] endorses Pulmonary was going to order in home PT/OT but they have not heard from anyone. Discussed will f/u and check to see if order has been sent. We talked about quality of life. We talked about [name] work and retirement. We talked about family dynamics. We talked about role of PC in POC. Discussed will f/u 2 months if needed or sooner if declines for ongoing discussions complex medical decision making.  Palliative Care was asked to help to continue to address goals of care.   CODE STATUS: DNR  PPS: 40% HOSPICE ELIGIBILITY/DIAGNOSIS: TBD  PAST MEDICAL HISTORY:  Past Medical History:  Diagnosis Date  . Anemia of chronic kidney failure 08/21/2018  . Arthritis    lower back  . CHF (congestive heart failure) ([place])   . CKD (chronic kidney disease), stage III ([place])   . COPD (chronic obstructive pulmonary disease) ([place])   . Diabetes  mellitus without complication ([place])    type 2  . Dyspnea    when active -wears oxygen 2L via [place]  . History of blood transfusion   . Hyperlipidemia   . Hypertension   . Requires  continuous at home supplemental oxygen    2L via [place]  . Squamous cell carcinoma of skin 02/03/2020   R lat cheek, ED[facility]    SOCIAL HX:  Social History   Tobacco Use  . Smoking status: Former Smoker    Packs/day: 2.00    Years: 35.00    Pack years: 70.00    Types: Cigarettes    Quit date: 11/04/1982    Years since quitting: 37.3  . Smokeless tobacco: Current User    Types: Chew  Substance Use Topics  . Alcohol use: Not Currently    ALLERGIES:  Allergies  Allergen Reactions  . Codeine Other (See Comments)    Constipation     PERTINENT MEDICATIONS:  Outpatient Encounter Medications as of 03/16/2020  Medication Sig  . acetaminophen (TYLENOL) 325 MG tablet Take 2 tablets (650 mg total) by mouth every 6 (six) hours as needed for mild pain (or Fever >/= 101).  Marland Kitchen albuterol (VENTOLIN HFA) 108 (90 Base) MCG/ACT inhaler Inhale 2 puffs into the lungs every 6 (six) hours as needed for wheezing or shortness of breath.  Marland Kitchen amLODipine (NORVASC) 2.5 MG tablet Take 2.5 mg by mouth daily.  Marland Kitchen ascorbic acid (VITAMIN C) 500 MG tablet Take 500 mg by mouth at bedtime.  . docusate sodium (COLACE) 100 MG capsule Take 1 capsule (100 mg total) by mouth 2 (two) times daily.  . insulin aspart (NOVOLOG) 100 UNIT/ML injection 4 units prior to meals if sugars above 200  . LEVEMIR 100 UNIT/ML injection Inject 0.04 mLs (4 Units total) into the skin at bedtime.  . metoCLOPramide (REGLAN) 5 MG tablet Take 5 mg by mouth 4 (four) times daily -  before meals and at bedtime.  Marland Kitchen nystatin cream (MYCOSTATIN) Apply topically.  . pantoprazole (PROTONIX) 40 MG tablet Take 1 tablet (40 mg total) by mouth daily.  . polyethylene glycol powder (GLYCOLAX/MIRALAX) 17 GM/SCOOP powder Take 17 g by mouth every other day.   . potassium chloride (KLOR-CON) 10 MEQ tablet Take 10 mEq by mouth every evening.  . pravastatin (PRAVACHOL) 10 MG tablet Take 10 mg by mouth at bedtime.  . promethazine (PHENERGAN) 25 MG tablet Take 25 mg by mouth  every 4 (four) hours as needed for nausea or vomiting.  . tamsulosin (FLOMAX) 0.4 MG CAPS capsule Take 0.4 mg by mouth daily.  Marland Kitchen tiotropium (SPIRIVA) 18 MCG inhalation capsule Place 18 mcg into inhaler and inhale daily.  Marland Kitchen torsemide (DEMADEX) 20 MG tablet Take 20 mg by mouth every Monday, Wednesday, and Friday.  . triamcinolone cream (KENALOG) 0.5 %   . TRUE METRIX BLOOD GLUCOSE TEST test strip 1 each by Other route every morning.   No facility-administered encounter medications on file as of 03/16/2020.    PHYSICAL EXAM:   General: NAD, frail appearing, pleasant male Cardiovascular: regular rate and rhythm Pulmonary: clear ant fields Neurological: generalized weakness; walks with walker  Christin Ihor Gully, NP

## 2020-03-19 ENCOUNTER — Inpatient Hospital Stay: Payer: Medicare HMO

## 2020-03-19 DIAGNOSIS — I509 Heart failure, unspecified: Secondary | ICD-10-CM | POA: Diagnosis not present

## 2020-03-19 DIAGNOSIS — Z79899 Other long term (current) drug therapy: Secondary | ICD-10-CM | POA: Diagnosis not present

## 2020-03-19 DIAGNOSIS — D509 Iron deficiency anemia, unspecified: Secondary | ICD-10-CM | POA: Diagnosis not present

## 2020-03-19 DIAGNOSIS — D631 Anemia in chronic kidney disease: Secondary | ICD-10-CM

## 2020-03-19 DIAGNOSIS — J449 Chronic obstructive pulmonary disease, unspecified: Secondary | ICD-10-CM | POA: Diagnosis not present

## 2020-03-19 DIAGNOSIS — E119 Type 2 diabetes mellitus without complications: Secondary | ICD-10-CM | POA: Diagnosis not present

## 2020-03-19 DIAGNOSIS — R609 Edema, unspecified: Secondary | ICD-10-CM | POA: Diagnosis not present

## 2020-03-19 DIAGNOSIS — N184 Chronic kidney disease, stage 4 (severe): Secondary | ICD-10-CM | POA: Diagnosis not present

## 2020-03-19 DIAGNOSIS — N183 Chronic kidney disease, stage 3 unspecified: Secondary | ICD-10-CM | POA: Diagnosis not present

## 2020-03-19 DIAGNOSIS — N189 Chronic kidney disease, unspecified: Secondary | ICD-10-CM

## 2020-03-19 DIAGNOSIS — E611 Iron deficiency: Secondary | ICD-10-CM | POA: Diagnosis not present

## 2020-03-19 DIAGNOSIS — I129 Hypertensive chronic kidney disease with stage 1 through stage 4 chronic kidney disease, or unspecified chronic kidney disease: Secondary | ICD-10-CM | POA: Diagnosis not present

## 2020-03-19 MED ORDER — SODIUM CHLORIDE 0.9 % IV SOLN: 200.0000 mg | Freq: Once | INTRAVENOUS | Status: DC

## 2020-03-19 MED ORDER — IRON SUCROSE 20 MG/ML IV SOLN
200.0000 mg | Freq: Once | INTRAVENOUS | Status: AC
  Administered 2020-03-19: 200 mg via INTRAVENOUS
  Filled 2020-03-19: qty 10

## 2020-03-19 MED ORDER — SODIUM CHLORIDE 0.9 % IV SOLN
INTRAVENOUS | Status: DC
  Filled 2020-03-19: qty 250

## 2020-03-26 ENCOUNTER — Other Ambulatory Visit: Payer: Self-pay

## 2020-03-26 ENCOUNTER — Inpatient Hospital Stay: Payer: Medicare HMO

## 2020-03-26 VITALS — BP 133/56 | HR 66

## 2020-03-26 DIAGNOSIS — I129 Hypertensive chronic kidney disease with stage 1 through stage 4 chronic kidney disease, or unspecified chronic kidney disease: Secondary | ICD-10-CM | POA: Diagnosis not present

## 2020-03-26 DIAGNOSIS — D631 Anemia in chronic kidney disease: Secondary | ICD-10-CM | POA: Diagnosis not present

## 2020-03-26 DIAGNOSIS — J449 Chronic obstructive pulmonary disease, unspecified: Secondary | ICD-10-CM | POA: Diagnosis not present

## 2020-03-26 DIAGNOSIS — D509 Iron deficiency anemia, unspecified: Secondary | ICD-10-CM | POA: Diagnosis not present

## 2020-03-26 DIAGNOSIS — E611 Iron deficiency: Secondary | ICD-10-CM | POA: Diagnosis not present

## 2020-03-26 DIAGNOSIS — N184 Chronic kidney disease, stage 4 (severe): Secondary | ICD-10-CM

## 2020-03-26 DIAGNOSIS — N183 Chronic kidney disease, stage 3 unspecified: Secondary | ICD-10-CM | POA: Diagnosis not present

## 2020-03-26 DIAGNOSIS — E119 Type 2 diabetes mellitus without complications: Secondary | ICD-10-CM | POA: Diagnosis not present

## 2020-03-26 DIAGNOSIS — N189 Chronic kidney disease, unspecified: Secondary | ICD-10-CM

## 2020-03-26 DIAGNOSIS — R609 Edema, unspecified: Secondary | ICD-10-CM | POA: Diagnosis not present

## 2020-03-26 DIAGNOSIS — I509 Heart failure, unspecified: Secondary | ICD-10-CM | POA: Diagnosis not present

## 2020-03-26 DIAGNOSIS — Z79899 Other long term (current) drug therapy: Secondary | ICD-10-CM | POA: Diagnosis not present

## 2020-03-26 MED ORDER — IRON SUCROSE 20 MG/ML IV SOLN
200.0000 mg | Freq: Once | INTRAVENOUS | Status: AC
  Administered 2020-03-26: 200 mg via INTRAVENOUS
  Filled 2020-03-26: qty 10

## 2020-03-26 MED ORDER — SODIUM CHLORIDE 0.9 % IV SOLN: 200.0000 mg | Freq: Once | INTRAVENOUS | Status: DC

## 2020-03-26 MED ORDER — SODIUM CHLORIDE 0.9 % IV SOLN
Freq: Once | INTRAVENOUS | Status: AC
  Filled 2020-03-26: qty 250

## 2020-03-27 DIAGNOSIS — J441 Chronic obstructive pulmonary disease with (acute) exacerbation: Secondary | ICD-10-CM | POA: Diagnosis not present

## 2020-03-29 ENCOUNTER — Inpatient Hospital Stay: Payer: Medicare HMO

## 2020-03-29 DIAGNOSIS — D509 Iron deficiency anemia, unspecified: Secondary | ICD-10-CM | POA: Diagnosis not present

## 2020-03-29 DIAGNOSIS — E1142 Type 2 diabetes mellitus with diabetic polyneuropathy: Secondary | ICD-10-CM | POA: Diagnosis not present

## 2020-03-29 DIAGNOSIS — R609 Edema, unspecified: Secondary | ICD-10-CM | POA: Diagnosis not present

## 2020-03-29 DIAGNOSIS — D631 Anemia in chronic kidney disease: Secondary | ICD-10-CM

## 2020-03-29 DIAGNOSIS — I129 Hypertensive chronic kidney disease with stage 1 through stage 4 chronic kidney disease, or unspecified chronic kidney disease: Secondary | ICD-10-CM | POA: Diagnosis not present

## 2020-03-29 DIAGNOSIS — J449 Chronic obstructive pulmonary disease, unspecified: Secondary | ICD-10-CM | POA: Diagnosis not present

## 2020-03-29 DIAGNOSIS — N184 Chronic kidney disease, stage 4 (severe): Secondary | ICD-10-CM | POA: Diagnosis not present

## 2020-03-29 DIAGNOSIS — N183 Chronic kidney disease, stage 3 unspecified: Secondary | ICD-10-CM | POA: Diagnosis not present

## 2020-03-29 DIAGNOSIS — I509 Heart failure, unspecified: Secondary | ICD-10-CM | POA: Diagnosis not present

## 2020-03-29 DIAGNOSIS — E119 Type 2 diabetes mellitus without complications: Secondary | ICD-10-CM | POA: Diagnosis not present

## 2020-03-29 DIAGNOSIS — Z79899 Other long term (current) drug therapy: Secondary | ICD-10-CM | POA: Diagnosis not present

## 2020-03-29 DIAGNOSIS — E611 Iron deficiency: Secondary | ICD-10-CM | POA: Diagnosis not present

## 2020-03-29 DIAGNOSIS — B351 Tinea unguium: Secondary | ICD-10-CM | POA: Diagnosis not present

## 2020-03-29 LAB — IRON AND TIBC
Iron: 59 ug/dL (ref 45–182)
Saturation Ratios: 37 % (ref 17.9–39.5)
TIBC: 161 ug/dL — ABNORMAL LOW (ref 250–450)
UIBC: 102 ug/dL

## 2020-03-29 LAB — CBC WITH DIFFERENTIAL/PLATELET
Abs Immature Granulocytes: 0.04 10*3/uL (ref 0.00–0.07)
Basophils Absolute: 0.1 10*3/uL (ref 0.0–0.1)
Basophils Relative: 1 %
Eosinophils Absolute: 0.3 10*3/uL (ref 0.0–0.5)
Eosinophils Relative: 4 %
HCT: 25.6 % — ABNORMAL LOW (ref 39.0–52.0)
Hemoglobin: 7.8 g/dL — ABNORMAL LOW (ref 13.0–17.0)
Immature Granulocytes: 1 %
Lymphocytes Relative: 18 %
Lymphs Abs: 1.3 10*3/uL (ref 0.7–4.0)
MCH: 29 pg (ref 26.0–34.0)
MCHC: 30.5 g/dL (ref 30.0–36.0)
MCV: 95.2 fL (ref 80.0–100.0)
Monocytes Absolute: 0.7 10*3/uL (ref 0.1–1.0)
Monocytes Relative: 10 %
Neutro Abs: 5 10*3/uL (ref 1.7–7.7)
Neutrophils Relative %: 66 %
Platelets: 243 10*3/uL (ref 150–400)
RBC: 2.69 MIL/uL — ABNORMAL LOW (ref 4.22–5.81)
RDW: 20.4 % — ABNORMAL HIGH (ref 11.5–15.5)
WBC: 7.4 10*3/uL (ref 4.0–10.5)
nRBC: 0.3 % — ABNORMAL HIGH (ref 0.0–0.2)

## 2020-03-29 LAB — FERRITIN: Ferritin: 890 ng/mL — ABNORMAL HIGH (ref 24–336)

## 2020-03-31 ENCOUNTER — Inpatient Hospital Stay (HOSPITAL_BASED_OUTPATIENT_CLINIC_OR_DEPARTMENT_OTHER): Payer: Medicare HMO | Admitting: Oncology

## 2020-03-31 ENCOUNTER — Encounter: Payer: Self-pay | Admitting: Oncology

## 2020-03-31 ENCOUNTER — Inpatient Hospital Stay: Payer: Medicare HMO

## 2020-03-31 VITALS — BP 147/56 | HR 60 | Resp 16

## 2020-03-31 VITALS — BP 158/69 | HR 71 | Temp 97.8°F | Resp 18 | Wt 180.1 lb

## 2020-03-31 DIAGNOSIS — Z87891 Personal history of nicotine dependence: Secondary | ICD-10-CM | POA: Diagnosis not present

## 2020-03-31 DIAGNOSIS — D509 Iron deficiency anemia, unspecified: Secondary | ICD-10-CM | POA: Diagnosis not present

## 2020-03-31 DIAGNOSIS — J449 Chronic obstructive pulmonary disease, unspecified: Secondary | ICD-10-CM | POA: Diagnosis not present

## 2020-03-31 DIAGNOSIS — Z85828 Personal history of other malignant neoplasm of skin: Secondary | ICD-10-CM | POA: Diagnosis not present

## 2020-03-31 DIAGNOSIS — I129 Hypertensive chronic kidney disease with stage 1 through stage 4 chronic kidney disease, or unspecified chronic kidney disease: Secondary | ICD-10-CM | POA: Diagnosis not present

## 2020-03-31 DIAGNOSIS — E611 Iron deficiency: Secondary | ICD-10-CM | POA: Diagnosis not present

## 2020-03-31 DIAGNOSIS — Z79899 Other long term (current) drug therapy: Secondary | ICD-10-CM | POA: Diagnosis not present

## 2020-03-31 DIAGNOSIS — D631 Anemia in chronic kidney disease: Secondary | ICD-10-CM

## 2020-03-31 DIAGNOSIS — R609 Edema, unspecified: Secondary | ICD-10-CM | POA: Diagnosis not present

## 2020-03-31 DIAGNOSIS — N184 Chronic kidney disease, stage 4 (severe): Secondary | ICD-10-CM | POA: Diagnosis not present

## 2020-03-31 DIAGNOSIS — E119 Type 2 diabetes mellitus without complications: Secondary | ICD-10-CM | POA: Diagnosis not present

## 2020-03-31 DIAGNOSIS — N183 Chronic kidney disease, stage 3 unspecified: Secondary | ICD-10-CM | POA: Diagnosis not present

## 2020-03-31 DIAGNOSIS — E785 Hyperlipidemia, unspecified: Secondary | ICD-10-CM | POA: Diagnosis not present

## 2020-03-31 DIAGNOSIS — I509 Heart failure, unspecified: Secondary | ICD-10-CM | POA: Diagnosis not present

## 2020-03-31 MED ORDER — SODIUM CHLORIDE 0.9 % IV SOLN: 200.0000 mg | Freq: Once | INTRAVENOUS | Status: DC

## 2020-03-31 MED ORDER — IRON SUCROSE 20 MG/ML IV SOLN
200.0000 mg | Freq: Once | INTRAVENOUS | Status: AC
  Administered 2020-03-31: 200 mg via INTRAVENOUS
  Filled 2020-03-31: qty 10

## 2020-03-31 NOTE — Progress Notes (Signed)
Hematology/Oncology  [facility] [phone] Fax:([phone]   Patient Care Team: Baxter Hire, MD as PCP - General (Internal Medicine) Earlie Server, MD as Consulting Physician (Oncology)  REFERRING PROVIDER: Baxter Hire, MD  CHIEF COMPLAINTS/REASON FOR VISIT:  Follow-up for anemia secondary to chronic kidney disease.  Possible MDS   INTERVAL HISTORY Arthur [name] is a 87 y.o. male who has above history reviewed by me today presents for follow up visit for management of anemia secondary to chronic kidney disease, possible underlying MDS Problems and complaints are listed below: Accompanied by his daughter.  No new complaints. Chronic fatigue.   Review of Systems  Constitutional: Positive for fatigue. Negative for appetite change, chills, fever and unexpected weight change.  HENT:   Negative for hearing loss and voice change.   Eyes: Negative for eye problems and icterus.  Respiratory: Negative for chest tightness, cough and shortness of breath.   Cardiovascular: Negative for chest pain and leg swelling.  Gastrointestinal: Negative for abdominal distention and abdominal pain.  Endocrine: Negative for hot flashes.  Genitourinary: Negative for difficulty urinating, dysuria and frequency.   Musculoskeletal: Negative for arthralgias.       Chronic back pain  Skin: Negative for itching and rash.  Neurological: Negative for light-headedness and numbness.  Hematological: Negative for adenopathy. Does not bruise/bleed easily.  Psychiatric/Behavioral: Negative for confusion.    MEDICAL HISTORY:  Past Medical History:  Diagnosis Date  . Anemia of chronic kidney failure 08/21/2018  . Arthritis    lower back  . CHF (congestive heart failure) ([place])   . CKD (chronic kidney disease), stage III ([place])   . COPD (chronic obstructive pulmonary disease) ([place])   . Diabetes mellitus without complication ([place])    type 2  . Dyspnea    when active  -wears oxygen 2L via [place]  . History of blood transfusion   . Hyperlipidemia   . Hypertension   . Requires continuous at home supplemental oxygen    2L via [place]  . Squamous cell carcinoma of skin 02/03/2020   R lat cheek, [facility]    SURGICAL HISTORY: Past Surgical History:  Procedure Laterality Date  . COLONOSCOPY WITH PROPOFOL N/A 06/11/2018   Procedure: COLONOSCOPY WITH PROPOFOL;  Surgeon: Jonathon Bellows, MD;  Location: [facility] ENDOSCOPY;  Service: Gastroenterology;  Laterality: N/A;  . COLONOSCOPY WITH PROPOFOL N/A 06/13/2018   Procedure: COLONOSCOPY WITH PROPOFOL;  Surgeon: Jonathon Bellows, MD;  Location: [facility] ENDOSCOPY;  Service: Gastroenterology;  Laterality: N/A;  . ESOPHAGOGASTRODUODENOSCOPY Left 06/10/2018   Procedure: ESOPHAGOGASTRODUODENOSCOPY (EGD);  Surgeon: Jonathon Bellows, MD;  Location: [facility] ENDOSCOPY;  Service: Gastroenterology;  Laterality: Left;  . EYE SURGERY     removed cataracts  . GIVENS CAPSULE STUDY N/A 06/28/2018   Procedure: GIVENS CAPSULE STUDY;  Surgeon: Lucilla Lame, MD;  Location: [facility] ENDOSCOPY;  Service: Endoscopy;  Laterality: N/A;  . GIVENS CAPSULE STUDY N/A 06/30/2018   Procedure: GIVENS CAPSULE STUDY;  Surgeon: Jonathon Bellows, MD;  Location: [facility] ENDOSCOPY;  Service: Gastroenterology;  Laterality: N/A;  . KYPHOPLASTY N/A 10/31/2018   Procedure: KYPHOPLASTY L2;  Surgeon: [name], MD;  Location: [facility] ORS;  Service: Orthopedics;  Laterality: N/A;  . KYPHOPLASTY N/A 12/24/2018   Procedure: KYPHOPLASTY L3;  Surgeon: [name], MD;  Location: [facility] ORS;  Service: Orthopedics;  Laterality: N/A;  . KYPHOPLASTY N/A 02/18/2019   Procedure: L2 KYPHOPLASTY;  Surgeon: [name], MD;  Location: [facility] ORS;  Service: Orthopedics;  Laterality: N/A;  . RADIOLOGY WITH ANESTHESIA  N/A 01/28/2019   Procedure: MRI LUMBER SPINE WITHOUT CONTRAST;  Surgeon: Radiologist, Medication, MD;  Location: [facility];  Service: Radiology;  Laterality: N/A;    SOCIAL HISTORY: Social History    Socioeconomic History  . Marital status: Widowed    Spouse name: Not on file  . Number of children: Not on file  . Years of education: Not on file  . Highest education level: Not on file  Occupational History  . Not on file  Tobacco Use  . Smoking status: Former Smoker    Packs/day: 2.00    Years: 35.00    Pack years: 70.00    Types: Cigarettes    Quit date: 11/04/1982    Years since quitting: 37.4  . Smokeless tobacco: Current User    Types: Chew  Vaping Use  . Vaping Use: Never used  Substance and Sexual Activity  . Alcohol use: Not Currently  . Drug use: No  . Sexual activity: Not Currently  Other Topics Concern  . Not on file  Social History Narrative  . Not on file   Social Determinants of Health   Financial Resource Strain: Not on file  Food Insecurity: Not on file  Transportation Needs: Not on file  Physical Activity: Not on file  Stress: Not on file  Social Connections: Not on file  Intimate Partner Violence: Not on file    FAMILY HISTORY: Family History  Problem Relation Age of Onset  . COPD Mother   . Cancer Father     ALLERGIES:  is allergic to codeine.  MEDICATIONS:  Current Outpatient Medications  Medication Sig Dispense Refill  . acetaminophen (TYLENOL) 325 MG tablet Take 2 tablets (650 mg total) by mouth every 6 (six) hours as needed for mild pain (or Fever >/= 101).    Marland Kitchen albuterol (VENTOLIN HFA) 108 (90 Base) MCG/ACT inhaler Inhale 2 puffs into the lungs every 6 (six) hours as needed for wheezing or shortness of breath. 1 Inhaler 2  . amLODipine (NORVASC) 2.5 MG tablet Take 2.5 mg by mouth daily.    Marland Kitchen ascorbic acid (VITAMIN C) 500 MG tablet Take 500 mg by mouth at bedtime.    . insulin aspart (NOVOLOG) 100 UNIT/ML injection 4 units prior to meals if sugars above 200 10 mL 11  . LEVEMIR 100 UNIT/ML injection Inject 0.04 mLs (4 Units total) into the skin at bedtime. 10 mL 0  . nystatin cream (MYCOSTATIN) Apply topically.    . pantoprazole  (PROTONIX) 40 MG tablet Take 1 tablet (40 mg total) by mouth daily. (Patient taking differently: Take 40 mg by mouth 2 (two) times daily.)    . potassium chloride (KLOR-CON) 10 MEQ tablet Take 10 mEq by mouth every evening.    . pravastatin (PRAVACHOL) 10 MG tablet Take 10 mg by mouth at bedtime.    . tamsulosin (FLOMAX) 0.4 MG CAPS capsule Take 0.4 mg by mouth daily.    Marland Kitchen tiotropium (SPIRIVA) 18 MCG inhalation capsule Place 18 mcg into inhaler and inhale daily.    Marland Kitchen torsemide (DEMADEX) 20 MG tablet Take 20 mg by mouth every Monday, Wednesday, and Friday.    . triamcinolone cream (KENALOG) 0.5 %     . TRUE METRIX BLOOD GLUCOSE TEST test strip 1 each by Other route every morning.    . docusate sodium (COLACE) 100 MG capsule Take 1 capsule (100 mg total) by mouth 2 (two) times daily. (Patient not taking: Reported on 03/31/2020) 60 capsule 0  . metoCLOPramide (REGLAN) 5  MG tablet Take 5 mg by mouth 4 (four) times daily -  before meals and at bedtime. (Patient not taking: Reported on 03/31/2020)    . polyethylene glycol powder (GLYCOLAX/MIRALAX) 17 GM/SCOOP powder Take 17 g by mouth every other day.  (Patient not taking: Reported on 03/31/2020)    . promethazine (PHENERGAN) 25 MG tablet Take 25 mg by mouth every 4 (four) hours as needed for nausea or vomiting. (Patient not taking: Reported on 03/31/2020)     No current facility-administered medications for this visit.     PHYSICAL EXAMINATION: ECOG PERFORMANCE STATUS: 3 - Symptomatic, >50% confined to bed Vitals:   03/31/20 0910  BP: (!) 158/69  Pulse: 71  Resp: 18  Temp: 97.8 F (36.6 C)   Filed Weights   03/31/20 0910  Weight: 180 lb 1.6 oz (81.7 kg)    Physical Exam Constitutional:      General: He is not in acute distress.    Appearance: He is ill-appearing.     Comments: Sits in wheel chair  HENT:     Head: Normocephalic and atraumatic.  Eyes:     General: No scleral icterus.    Pupils: Pupils are equal, round, and reactive to  light.  Cardiovascular:     Rate and Rhythm: Normal rate and regular rhythm.     Heart sounds: Normal heart sounds.  Pulmonary:     Effort: Pulmonary effort is normal. No respiratory distress.     Breath sounds: No wheezing.     Comments:  Decreased breath sounds at right base Abdominal:     General: Bowel sounds are normal. There is no distension.     Palpations: Abdomen is soft. There is no mass.     Tenderness: There is no abdominal tenderness.  Musculoskeletal:        General: No deformity.     Cervical back: Normal range of motion and neck supple.     Comments: Chronic bilateral lower extremity edema.    Skin:    General: Skin is warm and dry.     Coloration: Skin is not pale.     Findings: No erythema or rash.  Neurological:     Mental Status: He is alert. Mental status is at baseline.     Cranial Nerves: No cranial nerve deficit.     Coordination: Coordination normal.  Psychiatric:        Mood and Affect: Mood normal.     LABORATORY DATA:  I have reviewed the data as listed Lab Results  Component Value Date   WBC 7.4 03/29/2020   HGB 7.8 (L) 03/29/2020   HCT 25.6 (L) 03/29/2020   MCV 95.2 03/29/2020   PLT 243 03/29/2020   Recent Labs    07/23/19 1535 08/14/19 1528 02/13/20 1035 02/27/20 0807  NA 134* 138 137 134*  K 4.0 4.9 4.3 4.1  CL 99 107 103 101  CO2 _0 GLUCOSE 143* 154* 215* 225*  BUN 41* 46* 48* 46*  CREATININE 2.22* 1.98* 2.19* 2.05*  CALCIUM 8.4* 8.2* 8.3* 8.2*  GFRNONAA 26* 30* 29* 31*  GFRAA 30* 35*  --   --   PROT 7.7 7.4 6.6 7.2  ALBUMIN 3.7 3.7 3.6 3.9  AST 9* 10* 11* 12*  ALT _1 ALKPHOS 65 59 55 61  BILITOT 0.6 0.6 0.7 0.8   Iron/TIBC/Ferritin/ %Sat    Component Value Date/Time   IRON 59 03/29/2020 1454   IRON 36 (L)  07/15/2018 1544   TIBC 161 (L) 03/29/2020 1454   TIBC 172 (L) 07/15/2018 1544   FERRITIN 890 (H) 03/29/2020 1454   FERRITIN 535 (H) 07/15/2018 1544   IRONPCTSAT 37 03/29/2020 1454   IRONPCTSAT  21 07/15/2018 1544      RADIOGRAPHIC STUDIES: I have personally reviewed the radiological images as listed and agreed with the findings in the report. No results found.    ASSESSMENT & PLAN:  1. Anemia of chronic renal failure, stage 4 (severe) ([place])   2. Iron deficiency anemia, unspecified iron deficiency anemia type     #Anemia of chronic kidney disease,  Suspect MDS cannot be ruled out.  Patient declines bone marrow biopsy due to multiple other medical problems.- ? MDS versus ongoing GI blood loss Labs are reviewed and discussed with patient. Hemoglobin is stable. Reticulocyte panel remains low but slightly improved. Proceed with Venofer 235m x 1.   Follow up: H&H in 2 weeks, 6 weeks, 10 weeks +/- Retacrit.  Lab MD in 14 weeks.  All questions were answered. The patient knows to call the clinic with any problems questions or concerns.   ZEarlie Server MD, PhD Hematology Oncology CShoreline Surgery Center LLCat AProvidence Newberg Medical CenterPager- [phone]/30/2022

## 2020-03-31 NOTE — Progress Notes (Signed)
Patient here for follow up. No new concerns voiced.  °

## 2020-04-02 DIAGNOSIS — J441 Chronic obstructive pulmonary disease with (acute) exacerbation: Secondary | ICD-10-CM | POA: Diagnosis not present

## 2020-04-14 ENCOUNTER — Other Ambulatory Visit: Payer: Self-pay

## 2020-04-14 ENCOUNTER — Inpatient Hospital Stay: Payer: Medicare HMO

## 2020-04-14 ENCOUNTER — Inpatient Hospital Stay: Payer: Medicare HMO | Attending: Oncology

## 2020-04-14 VITALS — BP 146/52

## 2020-04-14 DIAGNOSIS — I129 Hypertensive chronic kidney disease with stage 1 through stage 4 chronic kidney disease, or unspecified chronic kidney disease: Secondary | ICD-10-CM | POA: Diagnosis not present

## 2020-04-14 DIAGNOSIS — D509 Iron deficiency anemia, unspecified: Secondary | ICD-10-CM

## 2020-04-14 DIAGNOSIS — N184 Chronic kidney disease, stage 4 (severe): Secondary | ICD-10-CM | POA: Insufficient documentation

## 2020-04-14 DIAGNOSIS — D631 Anemia in chronic kidney disease: Secondary | ICD-10-CM | POA: Diagnosis not present

## 2020-04-14 DIAGNOSIS — Z79899 Other long term (current) drug therapy: Secondary | ICD-10-CM | POA: Diagnosis not present

## 2020-04-14 LAB — HEMOGLOBIN AND HEMATOCRIT, BLOOD
HCT: 22.9 % — ABNORMAL LOW (ref 39.0–52.0)
Hemoglobin: 7.1 g/dL — ABNORMAL LOW (ref 13.0–17.0)

## 2020-04-14 MED ORDER — EPOETIN ALFA-EPBX 40000 UNIT/ML IJ SOLN: 60000.0000 [IU] | Freq: Once | INTRAMUSCULAR | Status: DC

## 2020-04-14 MED ORDER — EPOETIN ALFA-EPBX 10000 UNIT/ML IJ SOLN
20000.0000 [IU] | Freq: Once | INTRAMUSCULAR | Status: AC
  Administered 2020-04-14: 20000 [IU] via SUBCUTANEOUS

## 2020-04-14 MED ORDER — EPOETIN ALFA-EPBX 40000 UNIT/ML IJ SOLN
40000.0000 [IU] | Freq: Once | INTRAMUSCULAR | Status: AC
  Administered 2020-04-14: 40000 [IU] via SUBCUTANEOUS
  Filled 2020-04-14: qty 1

## 2020-04-14 MED ORDER — EPOETIN ALFA-EPBX 40000 UNIT/ML IJ SOLN
20000.0000 [IU] | Freq: Once | INTRAMUSCULAR | Status: DC
  Filled 2020-04-14: qty 1

## 2020-04-19 DIAGNOSIS — E1165 Type 2 diabetes mellitus with hyperglycemia: Secondary | ICD-10-CM | POA: Diagnosis not present

## 2020-04-27 DIAGNOSIS — J441 Chronic obstructive pulmonary disease with (acute) exacerbation: Secondary | ICD-10-CM | POA: Diagnosis not present

## 2020-04-27 DIAGNOSIS — J431 Panlobular emphysema: Secondary | ICD-10-CM | POA: Diagnosis not present

## 2020-04-27 DIAGNOSIS — Z01818 Encounter for other preprocedural examination: Secondary | ICD-10-CM | POA: Diagnosis not present

## 2020-04-29 DIAGNOSIS — D631 Anemia in chronic kidney disease: Secondary | ICD-10-CM | POA: Diagnosis not present

## 2020-04-29 DIAGNOSIS — Z0000 Encounter for general adult medical examination without abnormal findings: Secondary | ICD-10-CM | POA: Diagnosis not present

## 2020-04-29 DIAGNOSIS — I129 Hypertensive chronic kidney disease with stage 1 through stage 4 chronic kidney disease, or unspecified chronic kidney disease: Secondary | ICD-10-CM | POA: Diagnosis not present

## 2020-04-29 DIAGNOSIS — Z0001 Encounter for general adult medical examination with abnormal findings: Secondary | ICD-10-CM | POA: Diagnosis not present

## 2020-04-29 DIAGNOSIS — J431 Panlobular emphysema: Secondary | ICD-10-CM | POA: Diagnosis not present

## 2020-04-29 DIAGNOSIS — Z6826 Body mass index (BMI) 26.0-26.9, adult: Secondary | ICD-10-CM | POA: Diagnosis not present

## 2020-04-29 DIAGNOSIS — N1832 Chronic kidney disease, stage 3b: Secondary | ICD-10-CM | POA: Diagnosis not present

## 2020-04-29 DIAGNOSIS — E1122 Type 2 diabetes mellitus with diabetic chronic kidney disease: Secondary | ICD-10-CM | POA: Diagnosis not present

## 2020-05-02 DIAGNOSIS — J441 Chronic obstructive pulmonary disease with (acute) exacerbation: Secondary | ICD-10-CM | POA: Diagnosis not present

## 2020-05-04 ENCOUNTER — Encounter: Payer: Self-pay | Admitting: Nurse Practitioner

## 2020-05-04 ENCOUNTER — Other Ambulatory Visit: Payer: Medicare HMO | Admitting: Nurse Practitioner

## 2020-05-04 ENCOUNTER — Other Ambulatory Visit: Payer: Self-pay

## 2020-05-04 DIAGNOSIS — R0602 Shortness of breath: Secondary | ICD-10-CM | POA: Diagnosis not present

## 2020-05-04 DIAGNOSIS — Z515 Encounter for palliative care: Secondary | ICD-10-CM

## 2020-05-04 NOTE — Progress Notes (Signed)
[name] Palliative [facility] Consult Note Telephone: [phone]  Fax: [phone]    Date of encounter: 05/04/20 PATIENT NAME: Arthur [name] [address] [place] [address]   [phone] (home)  DOB: [birth year] MRN: [id number] PRIMARY CARE PROVIDER:    Baxter Hire, MD,  [place] [address] [phone]  RESPONSIBLE PARTY:    Contact Information    Name Relation Home Work [place], Arthur [name] Daughter   [phone]   [place], [place]   [phone]     Due to the COVID-19 crisis, this visit was done via telemedicine from my office and it was initiated and consent by this patient and or family.   I discussed the limitations of evaluation and management by telemedicine. The patient expressed understanding and agreed to proceed. Palliative Care was asked to follow this patient by consultation request of  Baxter Hire, MD to address advance care planning and complex medical decision making. This is a follow up visit.  ASSESSMENT AND PLAN / RECOMMENDATIONS:   Advance Care Planning/Goals of Care: Goals include to maximize quality of life and symptom management. Our advance care planning conversation included a discussion about:     The value and importance of advance care planning   Experiences with loved ones who have been seriously ill or have died   Exploration of personal, cultural or spiritual beliefs that might influence medical decisions   Exploration of goals of care in the event of a sudden injury or illness   Identification and preparation of a healthcare agent   Review and updating or creation of an  advance directive document .  Decision not to resuscitate or to de-escalate disease focused treatments due to poor prognosis.  CODE STATUS: DNR  Symptom Management/Plan: 1.ACP: DNR  2.shortness of breath secondary toCOPD/CHF;remain stable at present time. Continue inhalers, inhalation therapy  as needed.   3.Palliative care encounter Palliative medicine team will continue to support patient, patient's family, and medical team. Visit consisted of counseling and education dealing with the complex and emotionally intense issues of symptom management and palliative care in the setting of serious and potentially life-threatening illness  4. F/u 2 months for follow-up for chronic disease, symptom management as he continues to receiveerythropoietin replacement/transfusions   Follow up Palliative Care Visit: Palliative care will continue to follow for complex medical decision making, advance care planning, and clarification of goals. Return 8 weeks or prn.  I spent 35 minutes providing this consultation. More than 50% of the time in this consultation was spent in counseling and care coordination.  PPS: 50%  HOSPICE ELIGIBILITY/DIAGNOSIS: TBD  Chief Complaint: Follow-up Palliative consult for complex medical decision making  HISTORY OF PRESENT ILLNESS:  Arthur [name] is a 87 y.o. year old male  with COPD, congestive heart failure, anemia of chronic disease, chronic kidney disease stage 3, diabetes, hypertension, hyperlipidemia, history of GI bleed, allergies, tobacco use, obesity. I called Arthur [name], Arthur [name] for [facility] follow up visit changed to telemedicine telephonic as video not available. Arthur [name] and I talked about how Arthur [name] has been feeling. Arthur [name] endorses he has had annual visit with Dr Edwina Barth which was uneventful as well as Dr Lanney Gins Pulmonology. Dr Tasia Catchings Hematology/Oncology visit, continues to receive epoetin alfa-epbx (RETACRIT) injection. We talked about symptoms, currently he is comfortable free from pain, dyspnea. We talked about nutrition. We talked about disease progression. We talked about medical goals of care. Arthur [name] asked  about respite care for Arthur [name] for her. We talked about caregiver stress, fatigue. Discussed under Hospice services have respite but PC would not under  Hospice program but will check with PC SW to look at insurance to see if there is a respite coverage. Discussed will also ask Hospice Physician about eligibility. We talked about f/u PC visit, Arthur [name] in agreement. Appointment scheduled. Therapeutic listening, emotional support provided.   History obtained from review of EMR, discussion and  interview with daugter, Arthur [name] I reviewed available labs, medications, imaging, studies and related documents from the EMR.  Records reviewed and summarized above.   ROS   Physical Exam: deferred  Questions and concerns were addressed. The patient/family was encouraged to call with questions and/or concerns. My contact information was provided. Provided general support and encouragement, no other unmet needs identified  Thank you for the opportunity to participate in the care of Arthur [name].  The palliative care team will continue to follow. Please call our office at [phone] if we can be of additional assistance.   This chart was dictated using voice recognition software. Despite best efforts to proofread, errors can occur which can change the documentation meaning.  Rogen Porte Ihor Gully, NP , DNP, MPH, AGPCNP-BC, ACHPN

## 2020-05-05 ENCOUNTER — Telehealth: Payer: Self-pay

## 2020-05-05 NOTE — Telephone Encounter (Signed)
05/05/2020 @3 :30 PM: Palliative care SW outreached patients daughter, [name], per Palliative care NP request to assess and discuss respite stay support resources.   Call unsuccessful. SW left voicemail. Awaiting return call.

## 2020-05-11 ENCOUNTER — Ambulatory Visit: Payer: Medicare HMO | Admitting: Dermatology

## 2020-05-11 ENCOUNTER — Other Ambulatory Visit: Payer: Self-pay

## 2020-05-11 DIAGNOSIS — L578 Other skin changes due to chronic exposure to nonionizing radiation: Secondary | ICD-10-CM

## 2020-05-11 DIAGNOSIS — L821 Other seborrheic keratosis: Secondary | ICD-10-CM | POA: Diagnosis not present

## 2020-05-11 DIAGNOSIS — L820 Inflamed seborrheic keratosis: Secondary | ICD-10-CM | POA: Diagnosis not present

## 2020-05-11 DIAGNOSIS — Z85828 Personal history of other malignant neoplasm of skin: Secondary | ICD-10-CM

## 2020-05-11 DIAGNOSIS — Z872 Personal history of diseases of the skin and subcutaneous tissue: Secondary | ICD-10-CM

## 2020-05-11 NOTE — Patient Instructions (Addendum)

## 2020-05-11 NOTE — Progress Notes (Signed)
   Follow-Up Visit   Subjective  Arthur [name] is a 87 y.o. male who presents for the following: Follow-up (Patient here today for 3 month AK and bx follow up. Patient had SCC biopsied and treated with EDC at right lateral cheek. AK's treated with LN2 at right ant nasal ala and right temple. ).    The following portions of the chart were reviewed this encounter and updated as appropriate:       Review of Systems:  No other skin or systemic complaints except as noted in HPI or Assessment and Plan.  Objective  Well appearing patient in no apparent distress; mood and affect are within normal limits.  A focused examination was performed including face, scalp, hands. Relevant physical exam findings are noted in the Assessment and Plan.  Objective  right upper temple: Erythematous keratotic or waxy stuck-on papule   Assessment & Plan  Inflamed seborrheic keratosis right upper temple  Destruction of lesion - right upper temple  Destruction method: cryotherapy   Informed consent: discussed and consent obtained   Lesion destroyed using liquid nitrogen: Yes   Region frozen until ice ball extended beyond lesion: Yes   Outcome: patient tolerated procedure well with no complications   Post-procedure details: wound care instructions given     Actinic Damage - chronic, secondary to cumulative UV radiation exposure/sun exposure over time - diffuse scaly erythematous macules with underlying dyspigmentation - Recommend daily broad spectrum sunscreen SPF 30+ to sun-exposed areas, reapply every 2 hours as needed.  - Recommend staying in the shade or wearing long sleeves, sun glasses (UVA+UVB protection) and wide brim hats (4-inch brim around the entire circumference of the hat). - Call for new or changing lesions.  History of Squamous Cell Carcinoma of the Skin - No evidence of recurrence today at right lateral cheek, EDC 02/2020 - Recommend regular full body skin exams - Recommend daily  broad spectrum sunscreen SPF 30+ to sun-exposed areas, reapply every 2 hours as needed.  - Call if any new or changing lesions are noted between office visits  Seborrheic Keratoses - Stuck-on, waxy, tan-brown papules and/or plaques  - Benign-appearing - Discussed benign etiology and prognosis. - Observe - Call for any changes  History of PreCancerous Actinic Keratosis  - site(s) of PreCancerous Actinic Keratosis clear today. - these may recur and new lesions may form requiring treatment to prevent transformation into skin cancer - observe for new or changing spots and contact [place] for appointment if occur - photoprotection with sun protective clothing; sunglasses and broad spectrum sunscreen with SPF of at least 30 + and frequent self skin exams recommended - yearly exams by a dermatologist recommended for persons with history of PreCancerous Actinic Keratoses  Return in about 6 months (around 11/11/2020) for AK follow up, h/o SCC.  Graciella Belton, RMA, am acting as scribe for Brendolyn Patty, MD . Documentation: I have reviewed the above documentation for accuracy and completeness, and I agree with the above.  Brendolyn Patty MD

## 2020-05-12 ENCOUNTER — Inpatient Hospital Stay: Payer: Medicare HMO | Attending: Oncology

## 2020-05-12 ENCOUNTER — Inpatient Hospital Stay: Payer: Medicare HMO

## 2020-05-12 VITALS — BP 132/57 | HR 72

## 2020-05-12 DIAGNOSIS — D509 Iron deficiency anemia, unspecified: Secondary | ICD-10-CM

## 2020-05-12 DIAGNOSIS — N189 Chronic kidney disease, unspecified: Secondary | ICD-10-CM | POA: Diagnosis not present

## 2020-05-12 DIAGNOSIS — I129 Hypertensive chronic kidney disease with stage 1 through stage 4 chronic kidney disease, or unspecified chronic kidney disease: Secondary | ICD-10-CM | POA: Insufficient documentation

## 2020-05-12 DIAGNOSIS — D631 Anemia in chronic kidney disease: Secondary | ICD-10-CM

## 2020-05-12 DIAGNOSIS — N184 Chronic kidney disease, stage 4 (severe): Secondary | ICD-10-CM

## 2020-05-12 DIAGNOSIS — Z79899 Other long term (current) drug therapy: Secondary | ICD-10-CM | POA: Diagnosis not present

## 2020-05-12 LAB — HEMOGLOBIN AND HEMATOCRIT, BLOOD
HCT: 22.6 % — ABNORMAL LOW (ref 39.0–52.0)
Hemoglobin: 7 g/dL — ABNORMAL LOW (ref 13.0–17.0)

## 2020-05-12 MED ORDER — EPOETIN ALFA-EPBX 40000 UNIT/ML IJ SOLN: 60000.0000 [IU] | Freq: Once | INTRAMUSCULAR | Status: DC

## 2020-05-12 MED ORDER — EPOETIN ALFA-EPBX 40000 UNIT/ML IJ SOLN
40000.0000 [IU] | Freq: Once | INTRAMUSCULAR | Status: AC
  Administered 2020-05-12: 40000 [IU] via SUBCUTANEOUS
  Filled 2020-05-12: qty 1

## 2020-05-12 MED ORDER — EPOETIN ALFA-EPBX 10000 UNIT/ML IJ SOLN
20000.0000 [IU] | Freq: Once | INTRAMUSCULAR | Status: AC
  Administered 2020-05-12: 20000 [IU] via SUBCUTANEOUS
  Filled 2020-05-12: qty 2

## 2020-05-17 ENCOUNTER — Telehealth: Payer: Self-pay

## 2020-05-17 NOTE — Telephone Encounter (Signed)
Patient's daughter notified.

## 2020-05-17 NOTE — Telephone Encounter (Signed)
-----   Message from Earlie Server, MD sent at 05/15/2020  3:55 PM EDT ----- Please schedule him to get venofer weekly x 2. Thanks.

## 2020-05-17 NOTE — Telephone Encounter (Signed)
Patient's daughter comes with him to his appts and someone reviews messages in his 74.  MyChart message sent.  Please contact daughter to schedule appts.

## 2020-05-21 ENCOUNTER — Inpatient Hospital Stay: Payer: Medicare HMO

## 2020-05-21 VITALS — BP 110/47 | HR 79 | Temp 97.8°F | Resp 17

## 2020-05-21 DIAGNOSIS — N189 Chronic kidney disease, unspecified: Secondary | ICD-10-CM | POA: Diagnosis not present

## 2020-05-21 DIAGNOSIS — I129 Hypertensive chronic kidney disease with stage 1 through stage 4 chronic kidney disease, or unspecified chronic kidney disease: Secondary | ICD-10-CM | POA: Diagnosis not present

## 2020-05-21 DIAGNOSIS — D631 Anemia in chronic kidney disease: Secondary | ICD-10-CM

## 2020-05-21 DIAGNOSIS — Z79899 Other long term (current) drug therapy: Secondary | ICD-10-CM | POA: Diagnosis not present

## 2020-05-21 MED ORDER — IRON SUCROSE 20 MG/ML IV SOLN
200.0000 mg | Freq: Once | INTRAVENOUS | Status: AC
  Administered 2020-05-21: 200 mg via INTRAVENOUS
  Filled 2020-05-21: qty 10

## 2020-05-21 MED ORDER — SODIUM CHLORIDE 0.9 % IV SOLN
Freq: Once | INTRAVENOUS | Status: AC
Start: 2020-05-21 — End: 2020-05-21
  Filled 2020-05-21: qty 250

## 2020-05-21 MED ORDER — SODIUM CHLORIDE 0.9 % IV SOLN: 200.0000 mg | Freq: Once | INTRAVENOUS | Status: DC

## 2020-05-21 NOTE — Patient Instructions (Signed)

## 2020-05-27 DIAGNOSIS — J441 Chronic obstructive pulmonary disease with (acute) exacerbation: Secondary | ICD-10-CM | POA: Diagnosis not present

## 2020-05-28 ENCOUNTER — Inpatient Hospital Stay: Payer: Medicare HMO

## 2020-05-28 ENCOUNTER — Other Ambulatory Visit: Payer: Self-pay

## 2020-05-28 VITALS — BP 141/45 | HR 59 | Temp 96.3°F | Resp 17

## 2020-05-28 DIAGNOSIS — D631 Anemia in chronic kidney disease: Secondary | ICD-10-CM | POA: Diagnosis not present

## 2020-05-28 DIAGNOSIS — N189 Chronic kidney disease, unspecified: Secondary | ICD-10-CM | POA: Diagnosis not present

## 2020-05-28 DIAGNOSIS — Z79899 Other long term (current) drug therapy: Secondary | ICD-10-CM | POA: Diagnosis not present

## 2020-05-28 DIAGNOSIS — I129 Hypertensive chronic kidney disease with stage 1 through stage 4 chronic kidney disease, or unspecified chronic kidney disease: Secondary | ICD-10-CM | POA: Diagnosis not present

## 2020-05-28 MED ORDER — SODIUM CHLORIDE 0.9 % IV SOLN
Freq: Once | INTRAVENOUS | Status: AC
  Filled 2020-05-28: qty 250

## 2020-05-28 MED ORDER — SODIUM CHLORIDE 0.9 % IV SOLN: 200.0000 mg | Freq: Once | INTRAVENOUS | Status: DC

## 2020-05-28 MED ORDER — IRON SUCROSE 20 MG/ML IV SOLN
200.0000 mg | Freq: Once | INTRAVENOUS | Status: AC
  Administered 2020-05-28: 200 mg via INTRAVENOUS
  Filled 2020-05-28: qty 10

## 2020-05-28 NOTE — Patient Instructions (Signed)

## 2020-06-02 DIAGNOSIS — J441 Chronic obstructive pulmonary disease with (acute) exacerbation: Secondary | ICD-10-CM | POA: Diagnosis not present

## 2020-06-09 ENCOUNTER — Other Ambulatory Visit: Payer: Medicare HMO

## 2020-06-09 ENCOUNTER — Ambulatory Visit: Payer: Medicare HMO

## 2020-06-10 ENCOUNTER — Inpatient Hospital Stay: Payer: Medicare HMO

## 2020-06-10 ENCOUNTER — Inpatient Hospital Stay: Payer: Medicare HMO | Attending: Oncology

## 2020-06-10 VITALS — BP 145/68 | HR 75

## 2020-06-10 DIAGNOSIS — Z79899 Other long term (current) drug therapy: Secondary | ICD-10-CM | POA: Insufficient documentation

## 2020-06-10 DIAGNOSIS — N184 Chronic kidney disease, stage 4 (severe): Secondary | ICD-10-CM | POA: Diagnosis not present

## 2020-06-10 DIAGNOSIS — N189 Chronic kidney disease, unspecified: Secondary | ICD-10-CM

## 2020-06-10 DIAGNOSIS — I129 Hypertensive chronic kidney disease with stage 1 through stage 4 chronic kidney disease, or unspecified chronic kidney disease: Secondary | ICD-10-CM | POA: Insufficient documentation

## 2020-06-10 DIAGNOSIS — D631 Anemia in chronic kidney disease: Secondary | ICD-10-CM | POA: Insufficient documentation

## 2020-06-10 DIAGNOSIS — D509 Iron deficiency anemia, unspecified: Secondary | ICD-10-CM

## 2020-06-10 LAB — HEMOGLOBIN AND HEMATOCRIT, BLOOD
HCT: 22.5 % — ABNORMAL LOW (ref 39.0–52.0)
Hemoglobin: 6.8 g/dL — ABNORMAL LOW (ref 13.0–17.0)

## 2020-06-10 MED ORDER — EPOETIN ALFA-EPBX 10000 UNIT/ML IJ SOLN
20000.0000 [IU] | Freq: Once | INTRAMUSCULAR | Status: AC
  Administered 2020-06-10: 20000 [IU] via SUBCUTANEOUS
  Filled 2020-06-10: qty 2

## 2020-06-10 MED ORDER — EPOETIN ALFA-EPBX 40000 UNIT/ML IJ SOLN
40000.0000 [IU] | Freq: Once | INTRAMUSCULAR | Status: AC
  Administered 2020-06-10: 40000 [IU] via SUBCUTANEOUS
  Filled 2020-06-10: qty 1

## 2020-06-10 MED ORDER — EPOETIN ALFA-EPBX 40000 UNIT/ML IJ SOLN: 60000.0000 [IU] | Freq: Once | INTRAMUSCULAR | Status: DC

## 2020-06-16 ENCOUNTER — Other Ambulatory Visit: Payer: Self-pay

## 2020-06-16 ENCOUNTER — Telehealth: Payer: Self-pay

## 2020-06-16 DIAGNOSIS — N189 Chronic kidney disease, unspecified: Secondary | ICD-10-CM

## 2020-06-16 DIAGNOSIS — D649 Anemia, unspecified: Secondary | ICD-10-CM

## 2020-06-16 DIAGNOSIS — D631 Anemia in chronic kidney disease: Secondary | ICD-10-CM

## 2020-06-16 NOTE — Progress Notes (Signed)
Ph note created and sent to scheduling

## 2020-06-16 NOTE — Telephone Encounter (Signed)
06/16/2020 Spoke w/ pt's daughter and informed her that he will need lab/transfusion this week per [name]. Appt made for 06/18/20 @ 8:45. Daughter confirmed appt  SRW

## 2020-06-16 NOTE — Telephone Encounter (Signed)
-----   Message from Earlie Server, MD sent at 06/15/2020 11:58 PM EDT ----- Please arrange him to get 1 unit of PRBC transfusion. Let me know date so I can place order

## 2020-06-17 ENCOUNTER — Encounter: Payer: Self-pay | Admitting: Oncology

## 2020-06-18 ENCOUNTER — Ambulatory Visit: Payer: Medicare HMO

## 2020-06-18 ENCOUNTER — Other Ambulatory Visit: Payer: Self-pay | Admitting: Oncology

## 2020-06-18 ENCOUNTER — Inpatient Hospital Stay: Payer: Medicare HMO

## 2020-06-18 DIAGNOSIS — D631 Anemia in chronic kidney disease: Secondary | ICD-10-CM | POA: Diagnosis not present

## 2020-06-18 DIAGNOSIS — I129 Hypertensive chronic kidney disease with stage 1 through stage 4 chronic kidney disease, or unspecified chronic kidney disease: Secondary | ICD-10-CM | POA: Diagnosis not present

## 2020-06-18 DIAGNOSIS — D649 Anemia, unspecified: Secondary | ICD-10-CM

## 2020-06-18 DIAGNOSIS — N184 Chronic kidney disease, stage 4 (severe): Secondary | ICD-10-CM | POA: Diagnosis not present

## 2020-06-18 DIAGNOSIS — Z79899 Other long term (current) drug therapy: Secondary | ICD-10-CM | POA: Diagnosis not present

## 2020-06-18 LAB — PREPARE RBC (CROSSMATCH)

## 2020-06-18 MED ORDER — ACETAMINOPHEN 325 MG PO TABS
650.0000 mg | ORAL_TABLET | Freq: Once | ORAL | Status: AC
  Administered 2020-06-18: 650 mg via ORAL
  Filled 2020-06-18: qty 2

## 2020-06-18 MED ORDER — DIPHENHYDRAMINE HCL 25 MG PO CAPS
25.0000 mg | ORAL_CAPSULE | Freq: Once | ORAL | Status: AC
  Administered 2020-06-18: 25 mg via ORAL
  Filled 2020-06-18: qty 1

## 2020-06-18 MED ORDER — SODIUM CHLORIDE 0.9% IV SOLUTION
250.0000 mL | Freq: Once | INTRAVENOUS | Status: AC
  Administered 2020-06-18: 250 mL via INTRAVENOUS
  Filled 2020-06-18: qty 250

## 2020-06-18 MED ORDER — SODIUM CHLORIDE 0.9% FLUSH
10.0000 mL | INTRAVENOUS | Status: DC | PRN
  Filled 2020-06-18: qty 10

## 2020-06-18 NOTE — Patient Instructions (Signed)
Blood Transfusion, Adult, Care After This sheet gives you information about how to care for yourself after your procedure. Your doctor may also give you more specific instructions. If youhave problems or questions, contact your doctor. What can I expect after the procedure? After the procedure, it is common to have: Bruising and soreness at the IV site. A fever or chills on the day of the procedure. This may be your body's response to the new blood cells received. A headache. Follow these instructions at home: Insertion site care     Follow instructions from your doctor about how to take care of your insertion site. This is where an IV tube was put into your vein. Make sure you: Wash your hands with soap and water before and after you change your bandage (dressing). If you cannot use soap and water, use hand sanitizer. Change your bandage as told by your doctor. Check your insertion site every day for signs of infection. Check for: Redness, swelling, or pain. Bleeding from the site. Warmth. Pus or a bad smell. General instructions Take over-the-counter and prescription medicines only as told by your doctor. Rest as told by your doctor. Go back to your normal activities as told by your doctor. Keep all follow-up visits as told by your doctor. This is important. Contact a doctor if: You have itching or red, swollen areas of skin (hives). You feel worried or nervous (anxious). You feel weak after doing your normal activities. You have redness, swelling, warmth, or pain around the insertion site. You have blood coming from the insertion site, and the blood does not stop with pressure. You have pus or a bad smell coming from the insertion site. Get help right away if: You have signs of a serious reaction. This may be coming from an allergy or the body's defense system (immune system). Signs include: Trouble breathing or shortness of breath. Swelling of the face or feeling warm  (flushed). Fever or chills. Head, chest, or back pain. Dark pee (urine) or blood in the pee. Widespread rash. Fast heartbeat. Feeling dizzy or light-headed. You may receive your blood transfusion in an outpatient setting. If so, youwill be told whom to contact to report any reactions. These symptoms may be an emergency. Do not wait to see if the symptoms will go away. Get medical help right away. Call your local emergency services (911 in the [place] Do not drive yourself to the hospital. Summary Bruising and soreness at the IV site are common. Check your insertion site every day for signs of infection. Rest as told by your doctor. Go back to your normal activities as told by your doctor. Get help right away if you have signs of a serious reaction. This information is not intended to replace advice given to you by your health care provider. Make sure you discuss any questions you have with your healthcare provider. Document Revised: 06/13/2018 Document Reviewed: 06/13/2018 Elsevier Patient Education  2022 Elsevier Inc.  

## 2020-06-19 LAB — BPAM RBC
Blood Product Expiration Date: 202206262359
ISSUE DATE / TIME: 202206171118
Unit Type and Rh: 5100

## 2020-06-19 LAB — TYPE AND SCREEN
ABO/RH(D): A POS
Antibody Screen: NEGATIVE
Unit division: 0

## 2020-06-27 DIAGNOSIS — J441 Chronic obstructive pulmonary disease with (acute) exacerbation: Secondary | ICD-10-CM | POA: Diagnosis not present

## 2020-07-02 DIAGNOSIS — J441 Chronic obstructive pulmonary disease with (acute) exacerbation: Secondary | ICD-10-CM | POA: Diagnosis not present

## 2020-07-06 ENCOUNTER — Other Ambulatory Visit: Payer: Medicare HMO | Admitting: Nurse Practitioner

## 2020-07-07 ENCOUNTER — Inpatient Hospital Stay: Payer: Medicare HMO

## 2020-07-07 ENCOUNTER — Inpatient Hospital Stay: Payer: Medicare HMO | Admitting: Oncology

## 2020-07-07 ENCOUNTER — Inpatient Hospital Stay: Payer: Medicare HMO | Attending: Oncology

## 2020-07-07 ENCOUNTER — Encounter: Payer: Self-pay | Admitting: Oncology

## 2020-07-07 VITALS — BP 136/54 | HR 77 | Temp 99.2°F | Resp 18 | Wt 171.8 lb

## 2020-07-07 DIAGNOSIS — N184 Chronic kidney disease, stage 4 (severe): Secondary | ICD-10-CM | POA: Insufficient documentation

## 2020-07-07 DIAGNOSIS — I509 Heart failure, unspecified: Secondary | ICD-10-CM | POA: Insufficient documentation

## 2020-07-07 DIAGNOSIS — D509 Iron deficiency anemia, unspecified: Secondary | ICD-10-CM

## 2020-07-07 DIAGNOSIS — N189 Chronic kidney disease, unspecified: Secondary | ICD-10-CM

## 2020-07-07 DIAGNOSIS — Z79899 Other long term (current) drug therapy: Secondary | ICD-10-CM | POA: Insufficient documentation

## 2020-07-07 DIAGNOSIS — J449 Chronic obstructive pulmonary disease, unspecified: Secondary | ICD-10-CM | POA: Insufficient documentation

## 2020-07-07 DIAGNOSIS — I129 Hypertensive chronic kidney disease with stage 1 through stage 4 chronic kidney disease, or unspecified chronic kidney disease: Secondary | ICD-10-CM | POA: Diagnosis not present

## 2020-07-07 DIAGNOSIS — D631 Anemia in chronic kidney disease: Secondary | ICD-10-CM | POA: Insufficient documentation

## 2020-07-07 DIAGNOSIS — N183 Chronic kidney disease, stage 3 unspecified: Secondary | ICD-10-CM | POA: Insufficient documentation

## 2020-07-07 DIAGNOSIS — E1142 Type 2 diabetes mellitus with diabetic polyneuropathy: Secondary | ICD-10-CM | POA: Diagnosis not present

## 2020-07-07 DIAGNOSIS — E785 Hyperlipidemia, unspecified: Secondary | ICD-10-CM | POA: Diagnosis not present

## 2020-07-07 DIAGNOSIS — Z85828 Personal history of other malignant neoplasm of skin: Secondary | ICD-10-CM | POA: Diagnosis not present

## 2020-07-07 DIAGNOSIS — B351 Tinea unguium: Secondary | ICD-10-CM | POA: Diagnosis not present

## 2020-07-07 LAB — CBC WITH DIFFERENTIAL/PLATELET
Abs Immature Granulocytes: 0.06 10*3/uL (ref 0.00–0.07)
Basophils Absolute: 0.1 10*3/uL (ref 0.0–0.1)
Basophils Relative: 1 %
Eosinophils Absolute: 0.2 10*3/uL (ref 0.0–0.5)
Eosinophils Relative: 3 %
HCT: 26.3 % — ABNORMAL LOW (ref 39.0–52.0)
Hemoglobin: 7.8 g/dL — ABNORMAL LOW (ref 13.0–17.0)
Immature Granulocytes: 1 %
Lymphocytes Relative: 15 %
Lymphs Abs: 1.1 10*3/uL (ref 0.7–4.0)
MCH: 28.5 pg (ref 26.0–34.0)
MCHC: 29.7 g/dL — ABNORMAL LOW (ref 30.0–36.0)
MCV: 96 fL (ref 80.0–100.0)
Monocytes Absolute: 0.7 10*3/uL (ref 0.1–1.0)
Monocytes Relative: 10 %
Neutro Abs: 5.4 10*3/uL (ref 1.7–7.7)
Neutrophils Relative %: 70 %
Platelets: 265 10*3/uL (ref 150–400)
RBC: 2.74 MIL/uL — ABNORMAL LOW (ref 4.22–5.81)
RDW: 18.5 % — ABNORMAL HIGH (ref 11.5–15.5)
WBC: 7.7 10*3/uL (ref 4.0–10.5)
nRBC: 0 % (ref 0.0–0.2)

## 2020-07-07 LAB — RETIC PANEL
Immature Retic Fract: 8.5 % (ref 2.3–15.9)
RBC.: 2.75 MIL/uL — ABNORMAL LOW (ref 4.22–5.81)
Retic Count, Absolute: 14 10*3/uL — ABNORMAL LOW (ref 19.0–186.0)
Retic Ct Pct: 0.5 % (ref 0.4–3.1)
Reticulocyte Hemoglobin: 25.4 pg — ABNORMAL LOW (ref >27.9)

## 2020-07-07 MED ORDER — EPOETIN ALFA-EPBX 40000 UNIT/ML IJ SOLN: 60000.0000 [IU] | Freq: Once | INTRAMUSCULAR | Status: DC

## 2020-07-07 MED ORDER — EPOETIN ALFA-EPBX 40000 UNIT/ML IJ SOLN
40000.0000 [IU] | Freq: Once | INTRAMUSCULAR | Status: AC
  Administered 2020-07-07: 40000 [IU] via SUBCUTANEOUS

## 2020-07-07 MED ORDER — EPOETIN ALFA-EPBX 20000 UNIT/ML IJ SOLN
20000.0000 [IU] | Freq: Once | INTRAMUSCULAR | Status: DC
  Filled 2020-07-07: qty 1

## 2020-07-07 MED ORDER — EPOETIN ALFA-EPBX 10000 UNIT/ML IJ SOLN
20000.0000 [IU] | Freq: Once | INTRAMUSCULAR | Status: AC
  Administered 2020-07-07: 20000 [IU] via SUBCUTANEOUS

## 2020-07-07 MED ORDER — EPOETIN ALFA-EPBX 40000 UNIT/ML IJ SOLN
40000.0000 [IU] | Freq: Once | INTRAMUSCULAR | Status: DC
  Filled 2020-07-07: qty 1

## 2020-07-07 NOTE — Progress Notes (Signed)
Hematology/Oncology progress note [facility] Telephone:([phone] [phone]   Patient Care Team: Baxter Hire, MD as PCP - General (Internal Medicine) Earlie Server, MD as Consulting Physician (Oncology)  REFERRING PROVIDER: Baxter Hire, MD  CHIEF COMPLAINTS/REASON FOR VISIT:  Follow-up for anemia secondary to chronic kidney disease.  Possible MDS   INTERVAL HISTORY Arthur [name] is a 87 y.o. male who has above history reviewed by me today presents for follow up visit for management of anemia secondary to chronic kidney disease, possible underlying MDS Problems and complaints are listed below: Accompanied by his daughter.  No new complaints. Chronic fatigue.  S/p 1 unit of PRBC transfusion in June 2022.  Review of Systems  Constitutional:  Positive for fatigue. Negative for appetite change, chills, fever and unexpected weight change.  HENT:   Negative for hearing loss and voice change.   Eyes:  Negative for eye problems and icterus.  Respiratory:  Negative for chest tightness, cough and shortness of breath.   Cardiovascular:  Negative for chest pain and leg swelling.  Gastrointestinal:  Negative for abdominal distention and abdominal pain.  Endocrine: Negative for hot flashes.  Genitourinary:  Negative for difficulty urinating, dysuria and frequency.   Musculoskeletal:  Negative for arthralgias.       Chronic back pain  Skin:  Negative for itching and rash.  Neurological:  Negative for light-headedness and numbness.  Hematological:  Negative for adenopathy. Does not bruise/bleed easily.  Psychiatric/Behavioral:  Negative for confusion.    MEDICAL HISTORY:  Past Medical History:  Diagnosis Date   Anemia of chronic kidney failure 08/21/2018   Arthritis    lower back   CHF (congestive heart failure) (HCC)    CKD (chronic kidney disease), stage III (HCC)    COPD (chronic obstructive pulmonary disease) ([place])    Diabetes mellitus without  complication ([place])    type 2   Dyspnea    when active -wears oxygen 2L via [place]   History of blood transfusion    Hyperlipidemia    Hypertension    Requires continuous at home supplemental oxygen    2L via [place]   Squamous cell carcinoma of skin 02/03/2020   R lat cheek, EDC    SURGICAL HISTORY: Past Surgical History:  Procedure Laterality Date   COLONOSCOPY WITH PROPOFOL N/A 06/11/2018   Procedure: COLONOSCOPY WITH PROPOFOL;  Surgeon: Jonathon Bellows, MD;  Location: [facility] ENDOSCOPY;  Service: Gastroenterology;  Laterality: N/A;   COLONOSCOPY WITH PROPOFOL N/A 06/13/2018   Procedure: COLONOSCOPY WITH PROPOFOL;  Surgeon: Jonathon Bellows, MD;  Location: [facility] ENDOSCOPY;  Service: Gastroenterology;  Laterality: N/A;   ESOPHAGOGASTRODUODENOSCOPY Left 06/10/2018   Procedure: ESOPHAGOGASTRODUODENOSCOPY (EGD);  Surgeon: Jonathon Bellows, MD;  Location: [facility] ENDOSCOPY;  Service: Gastroenterology;  Laterality: Left;   EYE SURGERY     removed cataracts   GIVENS CAPSULE STUDY N/A 06/28/2018   Procedure: GIVENS CAPSULE STUDY;  Surgeon: Lucilla Lame, MD;  Location: [facility] ENDOSCOPY;  Service: Endoscopy;  Laterality: N/A;   GIVENS CAPSULE STUDY N/A 06/30/2018   Procedure: GIVENS CAPSULE STUDY;  Surgeon: Jonathon Bellows, MD;  Location: [facility] ENDOSCOPY;  Service: Gastroenterology;  Laterality: N/A;   KYPHOPLASTY N/A 10/31/2018   Procedure: KYPHOPLASTY L2;  Surgeon: [name], MD;  Location: [facility] ORS;  Service: Orthopedics;  Laterality: N/A;   KYPHOPLASTY N/A 12/24/2018   Procedure: KYPHOPLASTY L3;  Surgeon: [name], MD;  Location: [facility] ORS;  Service: Orthopedics;  Laterality: N/A;   KYPHOPLASTY N/A 02/18/2019   Procedure: L2  KYPHOPLASTY;  Surgeon: [name], MD;  Location: [facility] ORS;  Service: Orthopedics;  Laterality: N/A;   RADIOLOGY WITH ANESTHESIA N/A 01/28/2019   Procedure: MRI LUMBER SPINE WITHOUT CONTRAST;  Surgeon: Radiologist, Medication, MD;  Location: [place];  Service: Radiology;  Laterality: N/A;    SOCIAL  HISTORY: Social History   Socioeconomic History   Marital status: Widowed    Spouse name: Not on file   Number of children: Not on file   Years of education: Not on file   Highest education level: Not on file  Occupational History   Not on file  Tobacco Use   Smoking status: Former    Packs/day: 2.00    Years: 35.00    Pack years: 70.00    Types: Cigarettes    Quit date: 11/04/1982    Years since quitting: 37.6   Smokeless tobacco: Current    Types: Chew  Vaping Use   Vaping Use: Never used  Substance and Sexual Activity   Alcohol use: Not Currently   Drug use: No   Sexual activity: Not Currently  Other Topics Concern   Not on file  Social History Narrative   Not on file   Social Determinants of Health   Financial Resource Strain: Not on file  Food Insecurity: Not on file  Transportation Needs: Not on file  Physical Activity: Not on file  Stress: Not on file  Social Connections: Not on file  Intimate Partner Violence: Not on file    FAMILY HISTORY: Family History  Problem Relation Age of Onset   COPD Mother    Cancer Father     ALLERGIES:  is allergic to codeine.  MEDICATIONS:  Current Outpatient Medications  Medication Sig Dispense Refill   acetaminophen (TYLENOL) 325 MG tablet Take 2 tablets (650 mg total) by mouth every 6 (six) hours as needed for mild pain (or Fever >/= 101).     albuterol (VENTOLIN HFA) 108 (90 Base) MCG/ACT inhaler Inhale 2 puffs into the lungs every 6 (six) hours as needed for wheezing or shortness of breath. 1 Inhaler 2   amLODipine (NORVASC) 2.5 MG tablet Take 2.5 mg by mouth daily.     ascorbic acid (VITAMIN C) 500 MG tablet Take 500 mg by mouth at bedtime.     insulin aspart (NOVOLOG) 100 UNIT/ML injection 4 units prior to meals if sugars above 200 10 mL 11   LEVEMIR 100 UNIT/ML injection Inject 0.04 mLs (4 Units total) into the skin at bedtime. 10 mL 0   nystatin cream (MYCOSTATIN) Apply topically.     pantoprazole (PROTONIX)  40 MG tablet Take 1 tablet (40 mg total) by mouth daily. (Patient taking differently: Take 40 mg by mouth 2 (two) times daily.)     potassium chloride (KLOR-CON) 10 MEQ tablet Take 10 mEq by mouth every evening.     pravastatin (PRAVACHOL) 10 MG tablet Take 10 mg by mouth at bedtime.     tamsulosin (FLOMAX) 0.4 MG CAPS capsule Take 0.4 mg by mouth daily.     tiotropium (SPIRIVA) 18 MCG inhalation capsule Place 18 mcg into inhaler and inhale daily.     torsemide (DEMADEX) 20 MG tablet Take 20 mg by mouth every Monday, Wednesday, and Friday.     triamcinolone cream (KENALOG) 0.5 %      TRUE METRIX BLOOD GLUCOSE TEST test strip 1 each by Other route every morning.     docusate sodium (COLACE) 100 MG capsule Take 1 capsule (100 mg total) by  mouth 2 (two) times daily. (Patient not taking: No sig reported) 60 capsule 0   metoCLOPramide (REGLAN) 5 MG tablet Take 5 mg by mouth 4 (four) times daily -  before meals and at bedtime. (Patient not taking: No sig reported)     polyethylene glycol powder (GLYCOLAX/MIRALAX) 17 GM/SCOOP powder Take 17 g by mouth every other day.  (Patient not taking: No sig reported)     promethazine (PHENERGAN) 25 MG tablet Take 25 mg by mouth every 4 (four) hours as needed for nausea or vomiting. (Patient not taking: No sig reported)     No current facility-administered medications for this visit.     PHYSICAL EXAMINATION: ECOG PERFORMANCE STATUS: 3 - Symptomatic, >50% confined to bed Vitals:   07/07/20 1339  BP: (!) 136/54  Pulse: 77  Resp: 18  Temp: 99.2 F (37.3 C)   Filed Weights   07/07/20 1339  Weight: 171 lb 12.8 oz (77.9 kg)    Physical Exam Constitutional:      General: He is not in acute distress.    Appearance: He is ill-appearing.     Comments: Sits in wheel chair  HENT:     Head: Normocephalic and atraumatic.  Eyes:     General: No scleral icterus.    Pupils: Pupils are equal, round, and reactive to light.  Cardiovascular:     Rate and Rhythm:  Normal rate and regular rhythm.     Heart sounds: Normal heart sounds.  Pulmonary:     Effort: Pulmonary effort is normal. No respiratory distress.     Breath sounds: No wheezing.     Comments:  Decreased breath sounds at right base Abdominal:     General: Bowel sounds are normal. There is no distension.     Palpations: Abdomen is soft. There is no mass.     Tenderness: There is no abdominal tenderness.  Musculoskeletal:        General: No deformity.     Cervical back: Normal range of motion and neck supple.     Comments: Chronic bilateral lower extremity edema.    Skin:    General: Skin is warm and dry.     Coloration: Skin is not pale.     Findings: No erythema or rash.  Neurological:     Mental Status: He is alert. Mental status is at baseline.     Cranial Nerves: No cranial nerve deficit.     Coordination: Coordination normal.  Psychiatric:        Mood and Affect: Mood normal.    LABORATORY DATA:  I have reviewed the data as listed Lab Results  Component Value Date   WBC 7.7 07/07/2020   HGB 7.8 (L) 07/07/2020   HCT 26.3 (L) 07/07/2020   MCV 96.0 07/07/2020   PLT 265 07/07/2020   Recent Labs    07/23/19 1535 08/14/19 1528 02/13/20 1035 02/27/20 0807  NA 134* 138 137 134*  K 4.0 4.9 4.3 4.1  CL 99 107 103 101  CO2 25 26 25 24   GLUCOSE 143* 154* 215* 225*  BUN 41* 46* 48* 46*  CREATININE 2.22* 1.98* 2.19* 2.05*  CALCIUM 8.4* 8.2* 8.3* 8.2*  GFRNONAA 26* 30* 29* 31*  GFRAA 30* 35*  --   --   PROT 7.7 7.4 6.6 7.2  ALBUMIN 3.7 3.7 3.6 3.9  AST 9* 10* 11* 12*  ALT 8 8 7 9   ALKPHOS 65 59 55 61  BILITOT 0.6 0.6 0.7 0.8  Iron/TIBC/Ferritin/ %Sat    Component Value Date/Time   IRON 59 03/29/2020 1454   IRON 36 (L) 07/15/2018 1544   TIBC 161 (L) 03/29/2020 1454   TIBC 172 (L) 07/15/2018 1544   FERRITIN 890 (H) 03/29/2020 1454   FERRITIN 535 (H) 07/15/2018 1544   IRONPCTSAT 37 03/29/2020 1454   IRONPCTSAT 21 07/15/2018 1544      RADIOGRAPHIC  STUDIES: I have personally reviewed the radiological images as listed and agreed with the findings in the report. No results found.    ASSESSMENT & PLAN:  1. Anemia of chronic renal failure, unspecified CKD stage   2. CKD (chronic kidney disease) stage 4, GFR 15-29 ml/min (HCC)     #Anemia of chronic kidney disease,  Suspect MDS cannot be ruled out.  Patient declines bone marrow biopsy due to multiple other medical problems.- ? MDS versus ongoing GI blood loss Labs reviewed and discussed with patient. Hemoglobin 7.8. Proceed with Retacrit 60,000 unit x 1 today. I will increase the interval to every 3 weeks . There is discordancy between his iron panel and reticulocyte hemoglobin. Reticulocyte hemoglobin is persistently low indicating underlying iron deficiency however iron panel shows very elevated ferritin, which can be falsely high due to chronic inflammation. Discussed with daughter and patient that we may try additional IV Venofer treatments. If he is truly iron deficient, anticipate hemoglobin to response better with Retacrit. If he is not iron deficient, additional IV Venofer potentially increase his risk of iron overload. If that is the case, most likely anemia is due to underproductive marrow, suspect MDS.  He will be transfusion dependent which will eventually lead to iron overload as well.  Follow up: H&H in 3 weeks, 6 weeks, 9 weeks +/- Retacrit.  Lab CBC, iron TIBC ferritin, vitamin B12, folate, zinc,, MD +/- Retacrit in 12 weeks.  All questions were answered. The patient knows to call the clinic with any problems questions or concerns.   Earlie Server, MD, PhD Hematology Oncology [facility] Pager- [phone] 07/07/2020

## 2020-07-07 NOTE — Progress Notes (Signed)
Pt here for follow up. No new concerns voiced.   

## 2020-07-12 ENCOUNTER — Telehealth: Payer: Self-pay | Admitting: Oncology

## 2020-07-12 NOTE — Telephone Encounter (Signed)
Dr. Yu please advise  

## 2020-07-12 NOTE — Telephone Encounter (Signed)
Patient daughter called requesting to move the lab/injection appointments to as late in the day as possible so that she does not have to miss a day of work.  Looking at the schedules we would have to move his appointments back one day to Thursday.  Is it ok to move those and keep his lab/md/injection appointment on Wednesday 9/28 as she is unable to bring him to [place].  Routing to clinical team for confirmation.

## 2020-07-13 ENCOUNTER — Encounter: Payer: Self-pay | Admitting: Oncology

## 2020-07-13 NOTE — Telephone Encounter (Signed)
Please see MD response.

## 2020-07-13 NOTE — Telephone Encounter (Signed)
Appointments rescheduled, Lisa(daughter) notified and confirmed.

## 2020-07-27 DIAGNOSIS — J441 Chronic obstructive pulmonary disease with (acute) exacerbation: Secondary | ICD-10-CM | POA: Diagnosis not present

## 2020-07-28 ENCOUNTER — Ambulatory Visit: Payer: Medicare HMO

## 2020-07-28 ENCOUNTER — Other Ambulatory Visit: Payer: Medicare HMO

## 2020-07-29 ENCOUNTER — Inpatient Hospital Stay: Payer: Medicare HMO

## 2020-07-29 VITALS — BP 152/66 | HR 77

## 2020-07-29 DIAGNOSIS — N189 Chronic kidney disease, unspecified: Secondary | ICD-10-CM

## 2020-07-29 DIAGNOSIS — D631 Anemia in chronic kidney disease: Secondary | ICD-10-CM

## 2020-07-29 DIAGNOSIS — J449 Chronic obstructive pulmonary disease, unspecified: Secondary | ICD-10-CM | POA: Diagnosis not present

## 2020-07-29 DIAGNOSIS — E785 Hyperlipidemia, unspecified: Secondary | ICD-10-CM | POA: Diagnosis not present

## 2020-07-29 DIAGNOSIS — I509 Heart failure, unspecified: Secondary | ICD-10-CM | POA: Diagnosis not present

## 2020-07-29 DIAGNOSIS — Z85828 Personal history of other malignant neoplasm of skin: Secondary | ICD-10-CM | POA: Diagnosis not present

## 2020-07-29 DIAGNOSIS — I129 Hypertensive chronic kidney disease with stage 1 through stage 4 chronic kidney disease, or unspecified chronic kidney disease: Secondary | ICD-10-CM | POA: Diagnosis not present

## 2020-07-29 DIAGNOSIS — N184 Chronic kidney disease, stage 4 (severe): Secondary | ICD-10-CM | POA: Diagnosis not present

## 2020-07-29 DIAGNOSIS — Z79899 Other long term (current) drug therapy: Secondary | ICD-10-CM | POA: Diagnosis not present

## 2020-07-29 DIAGNOSIS — N183 Chronic kidney disease, stage 3 unspecified: Secondary | ICD-10-CM | POA: Diagnosis not present

## 2020-07-29 LAB — HEMOGLOBIN AND HEMATOCRIT, BLOOD
HCT: 25.2 % — ABNORMAL LOW (ref 39.0–52.0)
Hemoglobin: 7.6 g/dL — ABNORMAL LOW (ref 13.0–17.0)

## 2020-07-29 MED ORDER — EPOETIN ALFA-EPBX 40000 UNIT/ML IJ SOLN
40000.0000 [IU] | Freq: Once | INTRAMUSCULAR | Status: AC
  Administered 2020-07-29: 40000 [IU] via SUBCUTANEOUS
  Filled 2020-07-29: qty 1

## 2020-07-29 MED ORDER — EPOETIN ALFA-EPBX 40000 UNIT/ML IJ SOLN: 60000.0000 [IU] | Freq: Once | INTRAMUSCULAR | Status: DC

## 2020-07-29 MED ORDER — EPOETIN ALFA-EPBX 10000 UNIT/ML IJ SOLN
20000.0000 [IU] | Freq: Once | INTRAMUSCULAR | Status: AC
  Administered 2020-07-29: 20000 [IU] via SUBCUTANEOUS
  Filled 2020-07-29: qty 2

## 2020-08-02 ENCOUNTER — Telehealth: Payer: Self-pay

## 2020-08-02 DIAGNOSIS — J441 Chronic obstructive pulmonary disease with (acute) exacerbation: Secondary | ICD-10-CM | POA: Diagnosis not present

## 2020-08-02 NOTE — Telephone Encounter (Signed)
-----   Message from Earlie Server, MD sent at 08/01/2020  1:18 PM EDT ----- Please schedule him to get IV venofer 200mg  x 1. Not urgent, Next available appt Thanks.

## 2020-08-02 NOTE — Telephone Encounter (Signed)
Spoke with pt's daughter, Arthur [name]. She stated she is out of town this week and would prefer Thursday 8/11 3pm or after. I have them scheduled for 8/11 @ 3pm.

## 2020-08-02 NOTE — Telephone Encounter (Signed)
Please contact pt's daughter, [name], to schedule 1 dose of venofer, as requested by MD.

## 2020-08-12 ENCOUNTER — Inpatient Hospital Stay: Payer: Medicare HMO | Attending: Oncology

## 2020-08-12 VITALS — BP 141/48 | HR 74 | Temp 98.6°F | Resp 20

## 2020-08-12 DIAGNOSIS — N189 Chronic kidney disease, unspecified: Secondary | ICD-10-CM | POA: Diagnosis not present

## 2020-08-12 DIAGNOSIS — I129 Hypertensive chronic kidney disease with stage 1 through stage 4 chronic kidney disease, or unspecified chronic kidney disease: Secondary | ICD-10-CM | POA: Insufficient documentation

## 2020-08-12 DIAGNOSIS — Z79899 Other long term (current) drug therapy: Secondary | ICD-10-CM | POA: Insufficient documentation

## 2020-08-12 DIAGNOSIS — D631 Anemia in chronic kidney disease: Secondary | ICD-10-CM | POA: Insufficient documentation

## 2020-08-12 MED ORDER — SODIUM CHLORIDE 0.9 % IV SOLN
INTRAVENOUS | Status: DC
  Filled 2020-08-12: qty 250

## 2020-08-12 MED ORDER — SODIUM CHLORIDE 0.9 % IV SOLN: 200.0000 mg | Freq: Once | INTRAVENOUS | Status: DC

## 2020-08-12 MED ORDER — IRON SUCROSE 20 MG/ML IV SOLN
200.0000 mg | Freq: Once | INTRAVENOUS | Status: AC
  Administered 2020-08-12: 200 mg via INTRAVENOUS
  Filled 2020-08-12: qty 10

## 2020-08-12 NOTE — Patient Instructions (Signed)

## 2020-08-18 ENCOUNTER — Ambulatory Visit: Payer: Medicare HMO

## 2020-08-18 ENCOUNTER — Other Ambulatory Visit: Payer: Medicare HMO

## 2020-08-19 ENCOUNTER — Inpatient Hospital Stay: Payer: Medicare HMO

## 2020-08-19 ENCOUNTER — Other Ambulatory Visit: Payer: Self-pay

## 2020-08-19 VITALS — BP 158/68 | HR 85

## 2020-08-19 DIAGNOSIS — D631 Anemia in chronic kidney disease: Secondary | ICD-10-CM

## 2020-08-19 DIAGNOSIS — I129 Hypertensive chronic kidney disease with stage 1 through stage 4 chronic kidney disease, or unspecified chronic kidney disease: Secondary | ICD-10-CM | POA: Diagnosis not present

## 2020-08-19 DIAGNOSIS — N189 Chronic kidney disease, unspecified: Secondary | ICD-10-CM

## 2020-08-19 DIAGNOSIS — Z79899 Other long term (current) drug therapy: Secondary | ICD-10-CM | POA: Diagnosis not present

## 2020-08-19 LAB — HEMOGLOBIN AND HEMATOCRIT, BLOOD
HCT: 26.3 % — ABNORMAL LOW (ref 39.0–52.0)
Hemoglobin: 7.7 g/dL — ABNORMAL LOW (ref 13.0–17.0)

## 2020-08-19 MED ORDER — EPOETIN ALFA-EPBX 40000 UNIT/ML IJ SOLN
40000.0000 [IU] | Freq: Once | INTRAMUSCULAR | Status: AC
  Administered 2020-08-19: 40000 [IU] via SUBCUTANEOUS
  Filled 2020-08-19: qty 1

## 2020-08-19 MED ORDER — EPOETIN ALFA-EPBX 40000 UNIT/ML IJ SOLN: 60000.0000 [IU] | Freq: Once | INTRAMUSCULAR | Status: DC

## 2020-08-19 MED ORDER — EPOETIN ALFA-EPBX 10000 UNIT/ML IJ SOLN
20000.0000 [IU] | Freq: Once | INTRAMUSCULAR | Status: AC
  Administered 2020-08-19: 20000 [IU] via SUBCUTANEOUS
  Filled 2020-08-19: qty 2

## 2020-08-27 DIAGNOSIS — J441 Chronic obstructive pulmonary disease with (acute) exacerbation: Secondary | ICD-10-CM | POA: Diagnosis not present

## 2020-09-02 DIAGNOSIS — J441 Chronic obstructive pulmonary disease with (acute) exacerbation: Secondary | ICD-10-CM | POA: Diagnosis not present

## 2020-09-08 ENCOUNTER — Other Ambulatory Visit: Payer: Medicare HMO

## 2020-09-08 ENCOUNTER — Ambulatory Visit: Payer: Medicare HMO

## 2020-09-09 ENCOUNTER — Inpatient Hospital Stay: Payer: Medicare HMO | Attending: Oncology

## 2020-09-09 ENCOUNTER — Inpatient Hospital Stay: Payer: Medicare HMO

## 2020-09-09 DIAGNOSIS — Z79899 Other long term (current) drug therapy: Secondary | ICD-10-CM | POA: Insufficient documentation

## 2020-09-09 DIAGNOSIS — N189 Chronic kidney disease, unspecified: Secondary | ICD-10-CM | POA: Diagnosis not present

## 2020-09-09 DIAGNOSIS — I129 Hypertensive chronic kidney disease with stage 1 through stage 4 chronic kidney disease, or unspecified chronic kidney disease: Secondary | ICD-10-CM | POA: Diagnosis not present

## 2020-09-09 DIAGNOSIS — D631 Anemia in chronic kidney disease: Secondary | ICD-10-CM | POA: Diagnosis not present

## 2020-09-09 LAB — HEMOGLOBIN AND HEMATOCRIT, BLOOD
HCT: 25.5 % — ABNORMAL LOW (ref 39.0–52.0)
Hemoglobin: 7.7 g/dL — ABNORMAL LOW (ref 13.0–17.0)

## 2020-09-09 MED ORDER — EPOETIN ALFA-EPBX 20000 UNIT/ML IJ SOLN
20000.0000 [IU] | Freq: Once | INTRAMUSCULAR | Status: AC
  Administered 2020-09-09: 20000 [IU] via SUBCUTANEOUS

## 2020-09-09 MED ORDER — EPOETIN ALFA-EPBX 20000 UNIT/ML IJ SOLN
60000.0000 [IU] | Freq: Once | INTRAMUSCULAR | Status: DC
  Filled 2020-09-09: qty 3

## 2020-09-09 MED ORDER — EPOETIN ALFA-EPBX 10000 UNIT/ML IJ SOLN
40000.0000 [IU] | Freq: Once | INTRAMUSCULAR | Status: AC
  Administered 2020-09-09: 40000 [IU] via SUBCUTANEOUS

## 2020-09-27 DIAGNOSIS — J441 Chronic obstructive pulmonary disease with (acute) exacerbation: Secondary | ICD-10-CM | POA: Diagnosis not present

## 2020-09-29 ENCOUNTER — Inpatient Hospital Stay: Payer: Medicare HMO

## 2020-09-29 ENCOUNTER — Other Ambulatory Visit: Payer: Self-pay

## 2020-09-29 ENCOUNTER — Encounter: Payer: Self-pay | Admitting: Oncology

## 2020-09-29 ENCOUNTER — Inpatient Hospital Stay (HOSPITAL_BASED_OUTPATIENT_CLINIC_OR_DEPARTMENT_OTHER): Payer: Medicare HMO | Admitting: Oncology

## 2020-09-29 VITALS — BP 155/69 | HR 81 | Temp 97.4°F | Resp 18 | Wt 177.9 lb

## 2020-09-29 DIAGNOSIS — N184 Chronic kidney disease, stage 4 (severe): Secondary | ICD-10-CM

## 2020-09-29 DIAGNOSIS — I129 Hypertensive chronic kidney disease with stage 1 through stage 4 chronic kidney disease, or unspecified chronic kidney disease: Secondary | ICD-10-CM | POA: Diagnosis not present

## 2020-09-29 DIAGNOSIS — Z79899 Other long term (current) drug therapy: Secondary | ICD-10-CM | POA: Diagnosis not present

## 2020-09-29 DIAGNOSIS — D631 Anemia in chronic kidney disease: Secondary | ICD-10-CM

## 2020-09-29 DIAGNOSIS — N189 Chronic kidney disease, unspecified: Secondary | ICD-10-CM | POA: Diagnosis not present

## 2020-09-29 LAB — CBC WITH DIFFERENTIAL/PLATELET
Abs Immature Granulocytes: 0.01 10*3/uL (ref 0.00–0.07)
Basophils Absolute: 0.1 10*3/uL (ref 0.0–0.1)
Basophils Relative: 1 %
Eosinophils Absolute: 0.2 10*3/uL (ref 0.0–0.5)
Eosinophils Relative: 4 %
HCT: 26.3 % — ABNORMAL LOW (ref 39.0–52.0)
Hemoglobin: 7.9 g/dL — ABNORMAL LOW (ref 13.0–17.0)
Immature Granulocytes: 0 %
Lymphocytes Relative: 26 %
Lymphs Abs: 1.4 10*3/uL (ref 0.7–4.0)
MCH: 28.8 pg (ref 26.0–34.0)
MCHC: 30 g/dL (ref 30.0–36.0)
MCV: 96 fL (ref 80.0–100.0)
Monocytes Absolute: 0.7 10*3/uL (ref 0.1–1.0)
Monocytes Relative: 12 %
Neutro Abs: 3.2 10*3/uL (ref 1.7–7.7)
Neutrophils Relative %: 57 %
Platelets: 211 10*3/uL (ref 150–400)
RBC: 2.74 MIL/uL — ABNORMAL LOW (ref 4.22–5.81)
RDW: 21 % — ABNORMAL HIGH (ref 11.5–15.5)
WBC: 5.5 10*3/uL (ref 4.0–10.5)
nRBC: 0 % (ref 0.0–0.2)

## 2020-09-29 LAB — FOLATE: Folate: 7.7 ng/mL (ref >5.9)

## 2020-09-29 LAB — RETIC PANEL
Immature Retic Fract: 9.6 % (ref 2.3–15.9)
RBC.: 2.76 MIL/uL — ABNORMAL LOW (ref 4.22–5.81)
Retic Count, Absolute: 15.7 10*3/uL — ABNORMAL LOW (ref 19.0–186.0)
Retic Ct Pct: 0.6 % (ref 0.4–3.1)
Reticulocyte Hemoglobin: 25 pg — ABNORMAL LOW (ref >27.9)

## 2020-09-29 LAB — VITAMIN B12: Vitamin B-12: 398 pg/mL (ref 180–914)

## 2020-09-29 MED ORDER — EPOETIN ALFA-EPBX 40000 UNIT/ML IJ SOLN: 60000.0000 [IU] | Freq: Once | INTRAMUSCULAR | Status: DC

## 2020-09-29 MED ORDER — EPOETIN ALFA-EPBX 10000 UNIT/ML IJ SOLN
20000.0000 [IU] | Freq: Once | INTRAMUSCULAR | Status: AC
  Administered 2020-09-29: 20000 [IU] via SUBCUTANEOUS
  Filled 2020-09-29: qty 2

## 2020-09-29 MED ORDER — EPOETIN ALFA-EPBX 40000 UNIT/ML IJ SOLN
40000.0000 [IU] | Freq: Once | INTRAMUSCULAR | Status: AC
  Administered 2020-09-29: 40000 [IU] via SUBCUTANEOUS
  Filled 2020-09-29: qty 1

## 2020-09-29 NOTE — Progress Notes (Signed)
Hematology/Oncology progress note [facility] [phone] Fax:([phone]   Patient Care Team: Baxter Hire, MD as PCP - General (Internal Medicine) Earlie Server, MD as Consulting Physician (Oncology) Earlie Server, MD as Consulting Physician (Hematology and Oncology)  REFERRING PROVIDER: Baxter Hire, MD  CHIEF COMPLAINTS/REASON FOR VISIT:  Follow-up for anemia secondary to chronic kidney disease.  Possible MDS   INTERVAL HISTORY Arthur [name] is a 87 y.o. male who has above history reviewed by me today presents for follow up visit for management of anemia secondary to chronic kidney disease, possible underlying MDS Problems and complaints are listed below: Accompanied by his daughter.  Chronic fatigue, at baseline.   Review of Systems  Constitutional:  Positive for fatigue. Negative for appetite change, chills, fever and unexpected weight change.  HENT:   Negative for hearing loss and voice change.   Eyes:  Negative for eye problems and icterus.  Respiratory:  Negative for chest tightness, cough and shortness of breath.   Cardiovascular:  Negative for chest pain and leg swelling.  Gastrointestinal:  Negative for abdominal distention and abdominal pain.  Endocrine: Negative for hot flashes.  Genitourinary:  Negative for difficulty urinating, dysuria and frequency.   Musculoskeletal:  Negative for arthralgias.       Chronic back pain  Skin:  Negative for itching and rash.  Neurological:  Negative for light-headedness and numbness.  Hematological:  Negative for adenopathy. Does not bruise/bleed easily.  Psychiatric/Behavioral:  Negative for confusion.    MEDICAL HISTORY:  Past Medical History:  Diagnosis Date   Anemia of chronic kidney failure 08/21/2018   Arthritis    lower back   CHF (congestive heart failure) (HCC)    CKD (chronic kidney disease), stage III (HCC)    COPD (chronic obstructive pulmonary disease) ([place])    Diabetes  mellitus without complication ([place])    type 2   Dyspnea    when active -wears oxygen 2L via [place]   History of blood transfusion    Hyperlipidemia    Hypertension    Requires continuous at home supplemental oxygen    2L via [place]   Squamous cell carcinoma of skin 02/03/2020   R lat cheek, EDC    SURGICAL HISTORY: Past Surgical History:  Procedure Laterality Date   COLONOSCOPY WITH PROPOFOL N/A 06/11/2018   Procedure: COLONOSCOPY WITH PROPOFOL;  Surgeon: Jonathon Bellows, MD;  Location: [facility] ENDOSCOPY;  Service: Gastroenterology;  Laterality: N/A;   COLONOSCOPY WITH PROPOFOL N/A 06/13/2018   Procedure: COLONOSCOPY WITH PROPOFOL;  Surgeon: Jonathon Bellows, MD;  Location: [facility] ENDOSCOPY;  Service: Gastroenterology;  Laterality: N/A;   ESOPHAGOGASTRODUODENOSCOPY Left 06/10/2018   Procedure: ESOPHAGOGASTRODUODENOSCOPY (EGD);  Surgeon: Jonathon Bellows, MD;  Location: [facility] ENDOSCOPY;  Service: Gastroenterology;  Laterality: Left;   EYE SURGERY     removed cataracts   GIVENS CAPSULE STUDY N/A 06/28/2018   Procedure: GIVENS CAPSULE STUDY;  Surgeon: Lucilla Lame, MD;  Location: [facility] ENDOSCOPY;  Service: Endoscopy;  Laterality: N/A;   GIVENS CAPSULE STUDY N/A 06/30/2018   Procedure: GIVENS CAPSULE STUDY;  Surgeon: Jonathon Bellows, MD;  Location: [facility] ENDOSCOPY;  Service: Gastroenterology;  Laterality: N/A;   KYPHOPLASTY N/A 10/31/2018   Procedure: KYPHOPLASTY L2;  Surgeon: [name], MD;  Location: [facility] ORS;  Service: Orthopedics;  Laterality: N/A;   KYPHOPLASTY N/A 12/24/2018   Procedure: KYPHOPLASTY L3;  Surgeon: [name], MD;  Location: [facility] ORS;  Service: Orthopedics;  Laterality: N/A;   KYPHOPLASTY N/A 02/18/2019   Procedure: L2 KYPHOPLASTY;  Surgeon: [name], MD;  Location: [facility] ORS;  Service: Orthopedics;  Laterality: N/A;   RADIOLOGY WITH ANESTHESIA N/A 01/28/2019   Procedure: MRI LUMBER SPINE WITHOUT CONTRAST;  Surgeon: Radiologist, Medication, MD;  Location: [place];  Service: Radiology;  Laterality:  N/A;    SOCIAL HISTORY: Social History   Socioeconomic History   Marital status: Widowed    Spouse name: Not on file   Number of children: Not on file   Years of education: Not on file   Highest education level: Not on file  Occupational History   Not on file  Tobacco Use   Smoking status: Former    Packs/day: 2.00    Years: 35.00    Pack years: 70.00    Types: Cigarettes    Quit date: 11/04/1982    Years since quitting: 37.9   Smokeless tobacco: Current    Types: Chew  Vaping Use   Vaping Use: Never used  Substance and Sexual Activity   Alcohol use: Not Currently   Drug use: No   Sexual activity: Not Currently  Other Topics Concern   Not on file  Social History Narrative   Not on file   Social Determinants of Health   Financial Resource Strain: Not on file  Food Insecurity: Not on file  Transportation Needs: Not on file  Physical Activity: Not on file  Stress: Not on file  Social Connections: Not on file  Intimate Partner Violence: Not on file    FAMILY HISTORY: Family History  Problem Relation Age of Onset   COPD Mother    Cancer Father     ALLERGIES:  is allergic to codeine.  MEDICATIONS:  Current Outpatient Medications  Medication Sig Dispense Refill   acetaminophen (TYLENOL) 325 MG tablet Take 2 tablets (650 mg total) by mouth every 6 (six) hours as needed for mild pain (or Fever >/= 101).     albuterol (VENTOLIN HFA) 108 (90 Base) MCG/ACT inhaler Inhale 2 puffs into the lungs every 6 (six) hours as needed for wheezing or shortness of breath. 1 Inhaler 2   amLODipine (NORVASC) 2.5 MG tablet Take 2.5 mg by mouth daily.     ascorbic acid (VITAMIN C) 500 MG tablet Take 500 mg by mouth at bedtime.     insulin aspart (NOVOLOG) 100 UNIT/ML injection 4 units prior to meals if sugars above 200 10 mL 11   LEVEMIR 100 UNIT/ML injection Inject 0.04 mLs (4 Units total) into the skin at bedtime. 10 mL 0   pantoprazole (PROTONIX) 40 MG tablet Take 1 tablet (40 mg  total) by mouth daily. (Patient taking differently: Take 40 mg by mouth 2 (two) times daily.)     potassium chloride (KLOR-CON) 10 MEQ tablet Take 10 mEq by mouth every evening.     pravastatin (PRAVACHOL) 10 MG tablet Take 10 mg by mouth at bedtime.     tamsulosin (FLOMAX) 0.4 MG CAPS capsule Take 0.4 mg by mouth daily.     tiotropium (SPIRIVA) 18 MCG inhalation capsule Place 18 mcg into inhaler and inhale daily.     torsemide (DEMADEX) 20 MG tablet Take 20 mg by mouth every Monday, Wednesday, and Friday.     triamcinolone cream (KENALOG) 0.5 %      TRUE METRIX BLOOD GLUCOSE TEST test strip 1 each by Other route every morning.     docusate sodium (COLACE) 100 MG capsule Take 1 capsule (100 mg total) by mouth 2 (two) times daily. (Patient not taking: Reported on 09/29/2020)  60 capsule 0   metoCLOPramide (REGLAN) 5 MG tablet Take 5 mg by mouth 4 (four) times daily -  before meals and at bedtime. (Patient not taking: No sig reported)     polyethylene glycol powder (GLYCOLAX/MIRALAX) 17 GM/SCOOP powder Take 17 g by mouth every other day.  (Patient not taking: No sig reported)     promethazine (PHENERGAN) 25 MG tablet Take 25 mg by mouth every 4 (four) hours as needed for nausea or vomiting. (Patient not taking: No sig reported)     No current facility-administered medications for this visit.     PHYSICAL EXAMINATION: ECOG PERFORMANCE STATUS: 3 - Symptomatic, >50% confined to bed Vitals:   09/29/20 1333  BP: (!) 155/69  Pulse: 81  Resp: 18  Temp: (!) 97.4 F (36.3 C)   Filed Weights   09/29/20 1333  Weight: 177 lb 14.4 oz (80.7 kg)    Physical Exam Constitutional:      General: He is not in acute distress.    Appearance: He is ill-appearing.     Comments: Sits in wheel chair  HENT:     Head: Normocephalic and atraumatic.  Eyes:     General: No scleral icterus.    Pupils: Pupils are equal, round, and reactive to light.  Cardiovascular:     Rate and Rhythm: Normal rate and regular  rhythm.     Heart sounds: Normal heart sounds.  Pulmonary:     Effort: Pulmonary effort is normal. No respiratory distress.     Breath sounds: No wheezing.     Comments:  Decreased breath sounds at right base Abdominal:     General: Bowel sounds are normal. There is no distension.     Palpations: Abdomen is soft. There is no mass.     Tenderness: There is no abdominal tenderness.  Musculoskeletal:        General: No deformity.     Cervical back: Normal range of motion and neck supple.     Comments: Chronic bilateral lower extremity edema.    Skin:    General: Skin is warm and dry.     Coloration: Skin is not pale.     Findings: No erythema or rash.  Neurological:     Mental Status: He is alert. Mental status is at baseline.     Cranial Nerves: No cranial nerve deficit.     Coordination: Coordination normal.  Psychiatric:        Mood and Affect: Mood normal.    LABORATORY DATA:  I have reviewed the data as listed Lab Results  Component Value Date   WBC 5.5 09/29/2020   HGB 7.9 (L) 09/29/2020   HCT 26.3 (L) 09/29/2020   MCV 96.0 09/29/2020   PLT 211 09/29/2020   Recent Labs    02/13/20 1035 02/27/20 0807  NA 137 134*  K 4.3 4.1  CL 103 101  CO2 25 24  GLUCOSE 215* 225*  BUN 48* 46*  CREATININE 2.19* 2.05*  CALCIUM 8.3* 8.2*  GFRNONAA 29* 31*  PROT 6.6 7.2  ALBUMIN 3.6 3.9  AST 11* 12*  ALT 7 9  ALKPHOS 55 61  BILITOT 0.7 0.8    Iron/TIBC/Ferritin/ %Sat    Component Value Date/Time   IRON 59 03/29/2020 1454   IRON 36 (L) 07/15/2018 1544   TIBC 161 (L) 03/29/2020 1454   TIBC 172 (L) 07/15/2018 1544   FERRITIN 890 (H) 03/29/2020 1454   FERRITIN 535 (H) 07/15/2018 1544   IRONPCTSAT 37 03/29/2020  [place] 07/15/2018 1544      RADIOGRAPHIC STUDIES: I have personally reviewed the radiological images as listed and agreed with the findings in the report. No results found.    ASSESSMENT & PLAN:  1. Anemia of chronic renal failure, stage  4 (severe) (HCC)     #Anemia of chronic kidney disease,  Suspect MDS cannot be ruled out.  Patient declines bone marrow biopsy due to multiple other medical problems.- ? MDS versus ongoing GI blood loss Retic panel is low at 25, indicating iron deficiency. His iron panel has not been reliable due to chronic inflammation.  Hb is 7.9, proceed with retacrit 60,000 today.  Continue every 3 weeks IV venofer weekly x 2  Will arrange him to get H&H in 3, 6, 9 weeks +/- Retacrit.  Lab CBC, iron TIBC ferritin,, MD +/- Retacrit in 12 weeks.  All questions were answered. The patient knows to call the clinic with any problems questions or concerns.   Earlie Server, MD, PhD 09/29/2020

## 2020-09-29 NOTE — Progress Notes (Signed)
Pt here for follow up. No new concerns voiced.   

## 2020-09-30 ENCOUNTER — Telehealth: Payer: Self-pay

## 2020-09-30 LAB — ZINC: Zinc: 57 ug/dL (ref 44–115)

## 2020-09-30 NOTE — Telephone Encounter (Signed)
Please adjust appointments as requested by MD and call [name] (daughter) with appts. They prefer late afternoon appts.

## 2020-09-30 NOTE — Telephone Encounter (Signed)
-----   Message from Earlie Server, MD sent at 09/29/2020  9:08 PM EDT ----- My bad. He has been on retacrit Q3 weeks. Please adjust his plan to  get H&H in 3, 6, 9 weeks +/- Retacrit  Same follow up in 12 weeks.

## 2020-10-02 DIAGNOSIS — J441 Chronic obstructive pulmonary disease with (acute) exacerbation: Secondary | ICD-10-CM | POA: Diagnosis not present

## 2020-10-07 ENCOUNTER — Ambulatory Visit: Payer: Medicare HMO

## 2020-10-08 ENCOUNTER — Inpatient Hospital Stay: Payer: Medicare HMO | Attending: Oncology

## 2020-10-08 VITALS — BP 136/68 | HR 62 | Temp 97.8°F | Resp 18

## 2020-10-08 DIAGNOSIS — N189 Chronic kidney disease, unspecified: Secondary | ICD-10-CM | POA: Diagnosis not present

## 2020-10-08 DIAGNOSIS — D631 Anemia in chronic kidney disease: Secondary | ICD-10-CM | POA: Insufficient documentation

## 2020-10-08 DIAGNOSIS — Z79899 Other long term (current) drug therapy: Secondary | ICD-10-CM | POA: Diagnosis not present

## 2020-10-08 DIAGNOSIS — I129 Hypertensive chronic kidney disease with stage 1 through stage 4 chronic kidney disease, or unspecified chronic kidney disease: Secondary | ICD-10-CM | POA: Diagnosis not present

## 2020-10-08 MED ORDER — IRON SUCROSE 20 MG/ML IV SOLN
200.0000 mg | Freq: Once | INTRAVENOUS | Status: AC
  Administered 2020-10-08: 200 mg via INTRAVENOUS
  Filled 2020-10-08: qty 10

## 2020-10-08 MED ORDER — SODIUM CHLORIDE 0.9 % IV SOLN
INTRAVENOUS | Status: DC
  Filled 2020-10-08: qty 250

## 2020-10-08 MED ORDER — SODIUM CHLORIDE 0.9 % IV SOLN: 200.0000 mg | Freq: Once | INTRAVENOUS | Status: DC

## 2020-10-08 NOTE — Patient Instructions (Signed)
[facility] [place] [facility]   Discharge Instructions: Thank you for choosing [facility] to provide your oncology and hematology care.  If you have a lab appointment with the Cancer Center, please go directly to the Cancer Center and check in at the registration area.  We strive to give you quality time with your provider. You may need to reschedule your appointment if you arrive late (15 or more minutes).  Arriving late affects you and other patients whose appointments are after yours.  Also, if you miss three or more appointments without notifying the office, you may be dismissed from the clinic at the provider's discretion.      For prescription refill requests, have your pharmacy contact our office and allow 72 hours for refills to be completed.    Today you received the following: Venofer.      BELOW ARE SYMPTOMS THAT SHOULD BE REPORTED IMMEDIATELY: *FEVER GREATER THAN 100.4 F (38 C) OR HIGHER *CHILLS OR SWEATING *NAUSEA AND VOMITING THAT IS NOT CONTROLLED WITH YOUR NAUSEA MEDICATION *UNUSUAL SHORTNESS OF BREATH *UNUSUAL BRUISING OR BLEEDING *URINARY PROBLEMS (pain or burning when urinating, or frequent urination) *BOWEL PROBLEMS (unusual diarrhea, constipation, pain near the anus) TENDERNESS IN MOUTH AND THROAT WITH OR WITHOUT PRESENCE OF ULCERS (sore throat, sores in mouth, or a toothache) UNUSUAL RASH, SWELLING OR PAIN  UNUSUAL VAGINAL DISCHARGE OR ITCHING   Items with * indicate a potential emergency and should be followed up as soon as possible or go to the Emergency Department if any problems should occur.  Should you have questions after your visit or need to cancel or reschedule your appointment, please contact [facility] [place] [facility]  [phone] and follow the prompts.  Office hours are 8:00 a.m. to 4:30 p.m. Monday - Friday. Please note that voicemails left after 4:00 p.m. may not be returned until the following  business day.  We are closed weekends and major holidays. You have access to a nurse at all times for urgent questions. Please call the main number to the clinic [phone] and follow the prompts.  For any non-urgent questions, you may also contact your provider using MyChart. We now offer e-Visits for anyone 18 and older to request care online for non-urgent symptoms. For details visit [email or web address].   Also download the MyChart app! Go to the app store, search "MyChart", open the app, select [place], and log in with your MyChart username and password.  Due to Covid, a mask is required upon entering the hospital/clinic. If you do not have a mask, one will be given to you upon arrival. For doctor visits, patients may have 1 support person aged 18 or older with them. For treatment visits, patients cannot have anyone with them due to current Covid guidelines and our immunocompromised population.  

## 2020-10-14 ENCOUNTER — Telehealth: Payer: Self-pay | Admitting: Nurse Practitioner

## 2020-10-14 ENCOUNTER — Ambulatory Visit: Payer: Medicare HMO

## 2020-10-14 NOTE — Telephone Encounter (Signed)
Spoke with patient's sister-in-law, Kavan Devan, and she is going to let patient's dtr, [name] know to call us so that we can schedule a Palliative f/u visit with patient.

## 2020-10-15 ENCOUNTER — Other Ambulatory Visit: Payer: Self-pay

## 2020-10-15 ENCOUNTER — Inpatient Hospital Stay: Payer: Medicare HMO

## 2020-10-15 VITALS — BP 138/45 | HR 68 | Temp 98.6°F | Resp 20

## 2020-10-15 DIAGNOSIS — D631 Anemia in chronic kidney disease: Secondary | ICD-10-CM

## 2020-10-15 DIAGNOSIS — N189 Chronic kidney disease, unspecified: Secondary | ICD-10-CM | POA: Diagnosis not present

## 2020-10-15 DIAGNOSIS — I129 Hypertensive chronic kidney disease with stage 1 through stage 4 chronic kidney disease, or unspecified chronic kidney disease: Secondary | ICD-10-CM | POA: Diagnosis not present

## 2020-10-15 DIAGNOSIS — Z79899 Other long term (current) drug therapy: Secondary | ICD-10-CM | POA: Diagnosis not present

## 2020-10-15 MED ORDER — SODIUM CHLORIDE 0.9 % IV SOLN: 200.0000 mg | Freq: Once | INTRAVENOUS | Status: DC

## 2020-10-15 MED ORDER — SODIUM CHLORIDE 0.9 % IV SOLN
INTRAVENOUS | Status: AC
  Filled 2020-10-15 (×2): qty 250

## 2020-10-15 MED ORDER — IRON SUCROSE 20 MG/ML IV SOLN
200.0000 mg | Freq: Once | INTRAVENOUS | Status: AC
  Administered 2020-10-15: 200 mg via INTRAVENOUS
  Filled 2020-10-15: qty 10

## 2020-10-20 ENCOUNTER — Inpatient Hospital Stay: Payer: Medicare HMO

## 2020-10-20 ENCOUNTER — Other Ambulatory Visit: Payer: Self-pay

## 2020-10-20 VITALS — BP 162/57 | HR 67

## 2020-10-20 DIAGNOSIS — D631 Anemia in chronic kidney disease: Secondary | ICD-10-CM

## 2020-10-20 DIAGNOSIS — Z79899 Other long term (current) drug therapy: Secondary | ICD-10-CM | POA: Diagnosis not present

## 2020-10-20 DIAGNOSIS — I129 Hypertensive chronic kidney disease with stage 1 through stage 4 chronic kidney disease, or unspecified chronic kidney disease: Secondary | ICD-10-CM | POA: Diagnosis not present

## 2020-10-20 DIAGNOSIS — N189 Chronic kidney disease, unspecified: Secondary | ICD-10-CM | POA: Diagnosis not present

## 2020-10-20 DIAGNOSIS — Z794 Long term (current) use of insulin: Secondary | ICD-10-CM | POA: Diagnosis not present

## 2020-10-20 DIAGNOSIS — E119 Type 2 diabetes mellitus without complications: Secondary | ICD-10-CM | POA: Diagnosis not present

## 2020-10-20 LAB — HEMOGLOBIN AND HEMATOCRIT, BLOOD
HCT: 23.6 % — ABNORMAL LOW (ref 39.0–52.0)
Hemoglobin: 7.1 g/dL — ABNORMAL LOW (ref 13.0–17.0)

## 2020-10-20 LAB — SAMPLE TO BLOOD BANK

## 2020-10-20 MED ORDER — EPOETIN ALFA-EPBX 10000 UNIT/ML IJ SOLN
20000.0000 [IU] | Freq: Once | INTRAMUSCULAR | Status: AC
  Administered 2020-10-20: 20000 [IU] via SUBCUTANEOUS
  Filled 2020-10-20: qty 2

## 2020-10-20 MED ORDER — EPOETIN ALFA-EPBX 40000 UNIT/ML IJ SOLN
40000.0000 [IU] | Freq: Once | INTRAMUSCULAR | Status: AC
  Administered 2020-10-20: 40000 [IU] via SUBCUTANEOUS
  Filled 2020-10-20: qty 1

## 2020-10-20 MED ORDER — EPOETIN ALFA-EPBX 40000 UNIT/ML IJ SOLN: 60000.0000 [IU] | Freq: Once | INTRAMUSCULAR | Status: DC

## 2020-10-22 ENCOUNTER — Other Ambulatory Visit: Payer: Self-pay

## 2020-10-22 ENCOUNTER — Inpatient Hospital Stay: Payer: Medicare HMO

## 2020-10-22 VITALS — BP 147/69 | HR 68 | Resp 20

## 2020-10-22 DIAGNOSIS — D631 Anemia in chronic kidney disease: Secondary | ICD-10-CM

## 2020-10-22 DIAGNOSIS — I129 Hypertensive chronic kidney disease with stage 1 through stage 4 chronic kidney disease, or unspecified chronic kidney disease: Secondary | ICD-10-CM | POA: Diagnosis not present

## 2020-10-22 DIAGNOSIS — Z79899 Other long term (current) drug therapy: Secondary | ICD-10-CM | POA: Diagnosis not present

## 2020-10-22 DIAGNOSIS — N189 Chronic kidney disease, unspecified: Secondary | ICD-10-CM | POA: Diagnosis not present

## 2020-10-22 MED ORDER — IRON SUCROSE 20 MG/ML IV SOLN
200.0000 mg | Freq: Once | INTRAVENOUS | Status: AC
  Administered 2020-10-22: 200 mg via INTRAVENOUS
  Filled 2020-10-22: qty 10

## 2020-10-22 MED ORDER — SODIUM CHLORIDE 0.9 % IV SOLN
INTRAVENOUS | Status: DC
  Filled 2020-10-22: qty 250

## 2020-10-22 MED ORDER — SODIUM CHLORIDE 0.9 % IV SOLN: 200.0000 mg | Freq: Once | INTRAVENOUS | Status: DC

## 2020-10-25 ENCOUNTER — Inpatient Hospital Stay: Payer: Medicare HMO

## 2020-10-25 ENCOUNTER — Other Ambulatory Visit: Payer: Self-pay

## 2020-10-25 VITALS — BP 144/50 | HR 62 | Temp 97.4°F | Resp 19

## 2020-10-25 DIAGNOSIS — I129 Hypertensive chronic kidney disease with stage 1 through stage 4 chronic kidney disease, or unspecified chronic kidney disease: Secondary | ICD-10-CM | POA: Diagnosis not present

## 2020-10-25 DIAGNOSIS — Z79899 Other long term (current) drug therapy: Secondary | ICD-10-CM | POA: Diagnosis not present

## 2020-10-25 DIAGNOSIS — D631 Anemia in chronic kidney disease: Secondary | ICD-10-CM | POA: Diagnosis not present

## 2020-10-25 DIAGNOSIS — N189 Chronic kidney disease, unspecified: Secondary | ICD-10-CM | POA: Diagnosis not present

## 2020-10-25 DIAGNOSIS — B351 Tinea unguium: Secondary | ICD-10-CM | POA: Diagnosis not present

## 2020-10-25 DIAGNOSIS — E1142 Type 2 diabetes mellitus with diabetic polyneuropathy: Secondary | ICD-10-CM | POA: Diagnosis not present

## 2020-10-25 MED ORDER — IRON SUCROSE 20 MG/ML IV SOLN
200.0000 mg | Freq: Once | INTRAVENOUS | Status: AC
  Administered 2020-10-25: 200 mg via INTRAVENOUS
  Filled 2020-10-25: qty 10

## 2020-10-25 MED ORDER — SODIUM CHLORIDE 0.9 % IV SOLN
Freq: Once | INTRAVENOUS | Status: AC
Start: 2020-10-25 — End: 2020-10-25
  Filled 2020-10-25: qty 250

## 2020-10-25 MED ORDER — SODIUM CHLORIDE 0.9 % IV SOLN: 200.0000 mg | Freq: Once | INTRAVENOUS | Status: DC

## 2020-10-25 NOTE — Patient Instructions (Signed)

## 2020-10-27 ENCOUNTER — Other Ambulatory Visit: Payer: Medicare HMO

## 2020-10-27 ENCOUNTER — Ambulatory Visit: Payer: Medicare HMO

## 2020-10-27 DIAGNOSIS — J431 Panlobular emphysema: Secondary | ICD-10-CM | POA: Diagnosis not present

## 2020-10-27 DIAGNOSIS — N184 Chronic kidney disease, stage 4 (severe): Secondary | ICD-10-CM | POA: Diagnosis not present

## 2020-10-27 DIAGNOSIS — Z23 Encounter for immunization: Secondary | ICD-10-CM | POA: Diagnosis not present

## 2020-10-27 DIAGNOSIS — D631 Anemia in chronic kidney disease: Secondary | ICD-10-CM | POA: Diagnosis not present

## 2020-10-27 DIAGNOSIS — Z794 Long term (current) use of insulin: Secondary | ICD-10-CM | POA: Diagnosis not present

## 2020-10-27 DIAGNOSIS — I129 Hypertensive chronic kidney disease with stage 1 through stage 4 chronic kidney disease, or unspecified chronic kidney disease: Secondary | ICD-10-CM | POA: Diagnosis not present

## 2020-10-27 DIAGNOSIS — J449 Chronic obstructive pulmonary disease, unspecified: Secondary | ICD-10-CM | POA: Diagnosis not present

## 2020-10-27 DIAGNOSIS — J9611 Chronic respiratory failure with hypoxia: Secondary | ICD-10-CM | POA: Diagnosis not present

## 2020-10-27 DIAGNOSIS — E1122 Type 2 diabetes mellitus with diabetic chronic kidney disease: Secondary | ICD-10-CM | POA: Diagnosis not present

## 2020-10-27 DIAGNOSIS — J441 Chronic obstructive pulmonary disease with (acute) exacerbation: Secondary | ICD-10-CM | POA: Diagnosis not present

## 2020-11-01 ENCOUNTER — Other Ambulatory Visit: Payer: Self-pay

## 2020-11-01 ENCOUNTER — Inpatient Hospital Stay: Payer: Medicare HMO

## 2020-11-01 VITALS — BP 145/48 | HR 61 | Temp 96.4°F | Resp 18

## 2020-11-01 DIAGNOSIS — N189 Chronic kidney disease, unspecified: Secondary | ICD-10-CM

## 2020-11-01 DIAGNOSIS — I129 Hypertensive chronic kidney disease with stage 1 through stage 4 chronic kidney disease, or unspecified chronic kidney disease: Secondary | ICD-10-CM | POA: Diagnosis not present

## 2020-11-01 DIAGNOSIS — D631 Anemia in chronic kidney disease: Secondary | ICD-10-CM | POA: Diagnosis not present

## 2020-11-01 DIAGNOSIS — Z79899 Other long term (current) drug therapy: Secondary | ICD-10-CM | POA: Diagnosis not present

## 2020-11-01 MED ORDER — SODIUM CHLORIDE 0.9 % IV SOLN: 200.0000 mg | Freq: Once | INTRAVENOUS | Status: DC

## 2020-11-01 MED ORDER — IRON SUCROSE 20 MG/ML IV SOLN
200.0000 mg | Freq: Once | INTRAVENOUS | Status: AC
  Administered 2020-11-01: 200 mg via INTRAVENOUS
  Filled 2020-11-01: qty 10

## 2020-11-01 MED ORDER — SODIUM CHLORIDE 0.9 % IV SOLN
Freq: Once | INTRAVENOUS | Status: AC
  Filled 2020-11-01: qty 250

## 2020-11-01 NOTE — Patient Instructions (Signed)

## 2020-11-02 DIAGNOSIS — J441 Chronic obstructive pulmonary disease with (acute) exacerbation: Secondary | ICD-10-CM | POA: Diagnosis not present

## 2020-11-10 ENCOUNTER — Other Ambulatory Visit: Payer: Self-pay

## 2020-11-10 ENCOUNTER — Inpatient Hospital Stay: Payer: Medicare HMO

## 2020-11-10 ENCOUNTER — Inpatient Hospital Stay: Payer: Medicare HMO | Attending: Oncology

## 2020-11-10 VITALS — BP 159/65 | HR 69

## 2020-11-10 DIAGNOSIS — I129 Hypertensive chronic kidney disease with stage 1 through stage 4 chronic kidney disease, or unspecified chronic kidney disease: Secondary | ICD-10-CM | POA: Diagnosis not present

## 2020-11-10 DIAGNOSIS — D631 Anemia in chronic kidney disease: Secondary | ICD-10-CM

## 2020-11-10 DIAGNOSIS — Z79899 Other long term (current) drug therapy: Secondary | ICD-10-CM | POA: Diagnosis not present

## 2020-11-10 DIAGNOSIS — N189 Chronic kidney disease, unspecified: Secondary | ICD-10-CM | POA: Diagnosis not present

## 2020-11-10 LAB — HEMOGLOBIN AND HEMATOCRIT, BLOOD
HCT: 23.5 % — ABNORMAL LOW (ref 39.0–52.0)
Hemoglobin: 7.1 g/dL — ABNORMAL LOW (ref 13.0–17.0)

## 2020-11-10 MED ORDER — EPOETIN ALFA-EPBX 40000 UNIT/ML IJ SOLN
40000.0000 [IU] | Freq: Once | INTRAMUSCULAR | Status: AC
  Administered 2020-11-10: 40000 [IU] via SUBCUTANEOUS
  Filled 2020-11-10: qty 1

## 2020-11-10 MED ORDER — EPOETIN ALFA-EPBX 10000 UNIT/ML IJ SOLN
20000.0000 [IU] | Freq: Once | INTRAMUSCULAR | Status: AC
  Administered 2020-11-10: 20000 [IU] via SUBCUTANEOUS
  Filled 2020-11-10: qty 2

## 2020-11-10 MED ORDER — EPOETIN ALFA-EPBX 40000 UNIT/ML IJ SOLN: 60000.0000 [IU] | Freq: Once | INTRAMUSCULAR | Status: DC

## 2020-11-16 ENCOUNTER — Ambulatory Visit: Payer: Medicare HMO | Admitting: Dermatology

## 2020-11-16 ENCOUNTER — Other Ambulatory Visit: Payer: Self-pay

## 2020-11-16 DIAGNOSIS — Z85828 Personal history of other malignant neoplasm of skin: Secondary | ICD-10-CM | POA: Diagnosis not present

## 2020-11-16 DIAGNOSIS — L820 Inflamed seborrheic keratosis: Secondary | ICD-10-CM

## 2020-11-16 DIAGNOSIS — L578 Other skin changes due to chronic exposure to nonionizing radiation: Secondary | ICD-10-CM

## 2020-11-16 DIAGNOSIS — L821 Other seborrheic keratosis: Secondary | ICD-10-CM

## 2020-11-16 NOTE — Patient Instructions (Addendum)

## 2020-11-16 NOTE — Progress Notes (Signed)
   Follow-Up Visit   Subjective  Arthur [name] is a 87 y.o. male who presents for the following: Follow-up.  Patient here for 6 month follow-up Aks and history of SCC of the right lateral cheek. He has a couple of itchy spots on the face and neck.   The following portions of the chart were reviewed this encounter and updated as appropriate:       Review of Systems:  No other skin or systemic complaints except as noted in HPI or Assessment and Plan.  Objective  Well appearing patient in no apparent distress; mood and affect are within normal limits.  A focused examination was performed including face. Relevant physical exam findings are noted in the Assessment and Plan.  R lateral cheek Well healed scar with no evidence of recurrence.   L temple x 2, L neck below jaw x 2 (4) Erythematous keratotic or waxy stuck-on papule or plaque.    Assessment & Plan  Actinic Damage - chronic, secondary to cumulative UV radiation exposure/sun exposure over time - diffuse scaly erythematous macules with underlying dyspigmentation - Recommend daily broad spectrum sunscreen SPF 30+ to sun-exposed areas, reapply every 2 hours as needed.  - Recommend staying in the shade or wearing long sleeves, sun glasses (UVA+UVB protection) and wide brim hats (4-inch brim around the entire circumference of the hat). - Call for new or changing lesions.  Seborrheic Keratoses - Stuck-on, waxy, tan-brown papules and/or plaques  - Benign-appearing - Discussed benign etiology and prognosis. - Observe - Call for any changes   History of SCC (squamous cell carcinoma) of skin R lateral cheek  Clear. Observe for recurrence. Call clinic for new or changing lesions.  Recommend regular skin exams, daily broad-spectrum spf 30+ sunscreen use, and photoprotection.    Inflamed seborrheic keratosis L temple x 2, L neck below jaw x 2  Destruction of lesion - L temple x 2, L neck below jaw x 2  Destruction method:  cryotherapy   Informed consent: discussed and consent obtained   Lesion destroyed using liquid nitrogen: Yes   Region frozen until ice ball extended beyond lesion: Yes   Outcome: patient tolerated procedure well with no complications   Post-procedure details: wound care instructions given   Additional details:  Prior to procedure, discussed risks of blister formation, small wound, skin dyspigmentation, or rare scar following cryotherapy. Recommend Vaseline ointment to treated areas while healing.   Return in about 6 months (around 05/16/2021) for Hx SCC, AKs, UBSE.  IJamesetta Orleans, CMA, am acting as scribe for Brendolyn Patty, MD .  Documentation: I have reviewed the above documentation for accuracy and completeness, and I agree with the above.  Brendolyn Patty MD

## 2020-11-27 DIAGNOSIS — J441 Chronic obstructive pulmonary disease with (acute) exacerbation: Secondary | ICD-10-CM | POA: Diagnosis not present

## 2020-11-30 ENCOUNTER — Other Ambulatory Visit: Payer: Self-pay | Admitting: *Deleted

## 2020-11-30 DIAGNOSIS — D631 Anemia in chronic kidney disease: Secondary | ICD-10-CM

## 2020-12-01 ENCOUNTER — Inpatient Hospital Stay: Payer: Medicare HMO

## 2020-12-01 ENCOUNTER — Other Ambulatory Visit: Payer: Medicare HMO

## 2020-12-01 ENCOUNTER — Ambulatory Visit: Payer: Medicare HMO

## 2020-12-01 ENCOUNTER — Other Ambulatory Visit: Payer: Self-pay

## 2020-12-01 VITALS — BP 150/55 | HR 67

## 2020-12-01 DIAGNOSIS — D631 Anemia in chronic kidney disease: Secondary | ICD-10-CM

## 2020-12-01 DIAGNOSIS — N189 Chronic kidney disease, unspecified: Secondary | ICD-10-CM

## 2020-12-01 DIAGNOSIS — Z79899 Other long term (current) drug therapy: Secondary | ICD-10-CM | POA: Diagnosis not present

## 2020-12-01 DIAGNOSIS — I129 Hypertensive chronic kidney disease with stage 1 through stage 4 chronic kidney disease, or unspecified chronic kidney disease: Secondary | ICD-10-CM | POA: Diagnosis not present

## 2020-12-01 LAB — HEMOGLOBIN: Hemoglobin: 7.4 g/dL — ABNORMAL LOW (ref 13.0–17.0)

## 2020-12-01 LAB — HEMATOCRIT: HCT: 25.2 % — ABNORMAL LOW (ref 39.0–52.0)

## 2020-12-01 MED ORDER — EPOETIN ALFA-EPBX 40000 UNIT/ML IJ SOLN
40000.0000 [IU] | Freq: Once | INTRAMUSCULAR | Status: AC
  Administered 2020-12-01: 40000 [IU] via SUBCUTANEOUS
  Filled 2020-12-01: qty 1

## 2020-12-01 MED ORDER — EPOETIN ALFA-EPBX 10000 UNIT/ML IJ SOLN
20000.0000 [IU] | Freq: Once | INTRAMUSCULAR | Status: AC
  Administered 2020-12-01: 20000 [IU] via SUBCUTANEOUS
  Filled 2020-12-01: qty 2

## 2020-12-01 MED ORDER — EPOETIN ALFA-EPBX 40000 UNIT/ML IJ SOLN: 60000.0000 [IU] | Freq: Once | INTRAMUSCULAR | Status: DC

## 2020-12-09 IMAGING — DX PORTABLE CHEST - 1 VIEW
1 series · 2 of 2 positions shown · non-contrast
Comparison: None.

CLINICAL DATA: Sepsis fever productive cough

EXAM:
PORTABLE CHEST 1 VIEW

[Series 1: chest ap · 0.14mm/px · 2 of 2 slices shown]
[im 1/2]
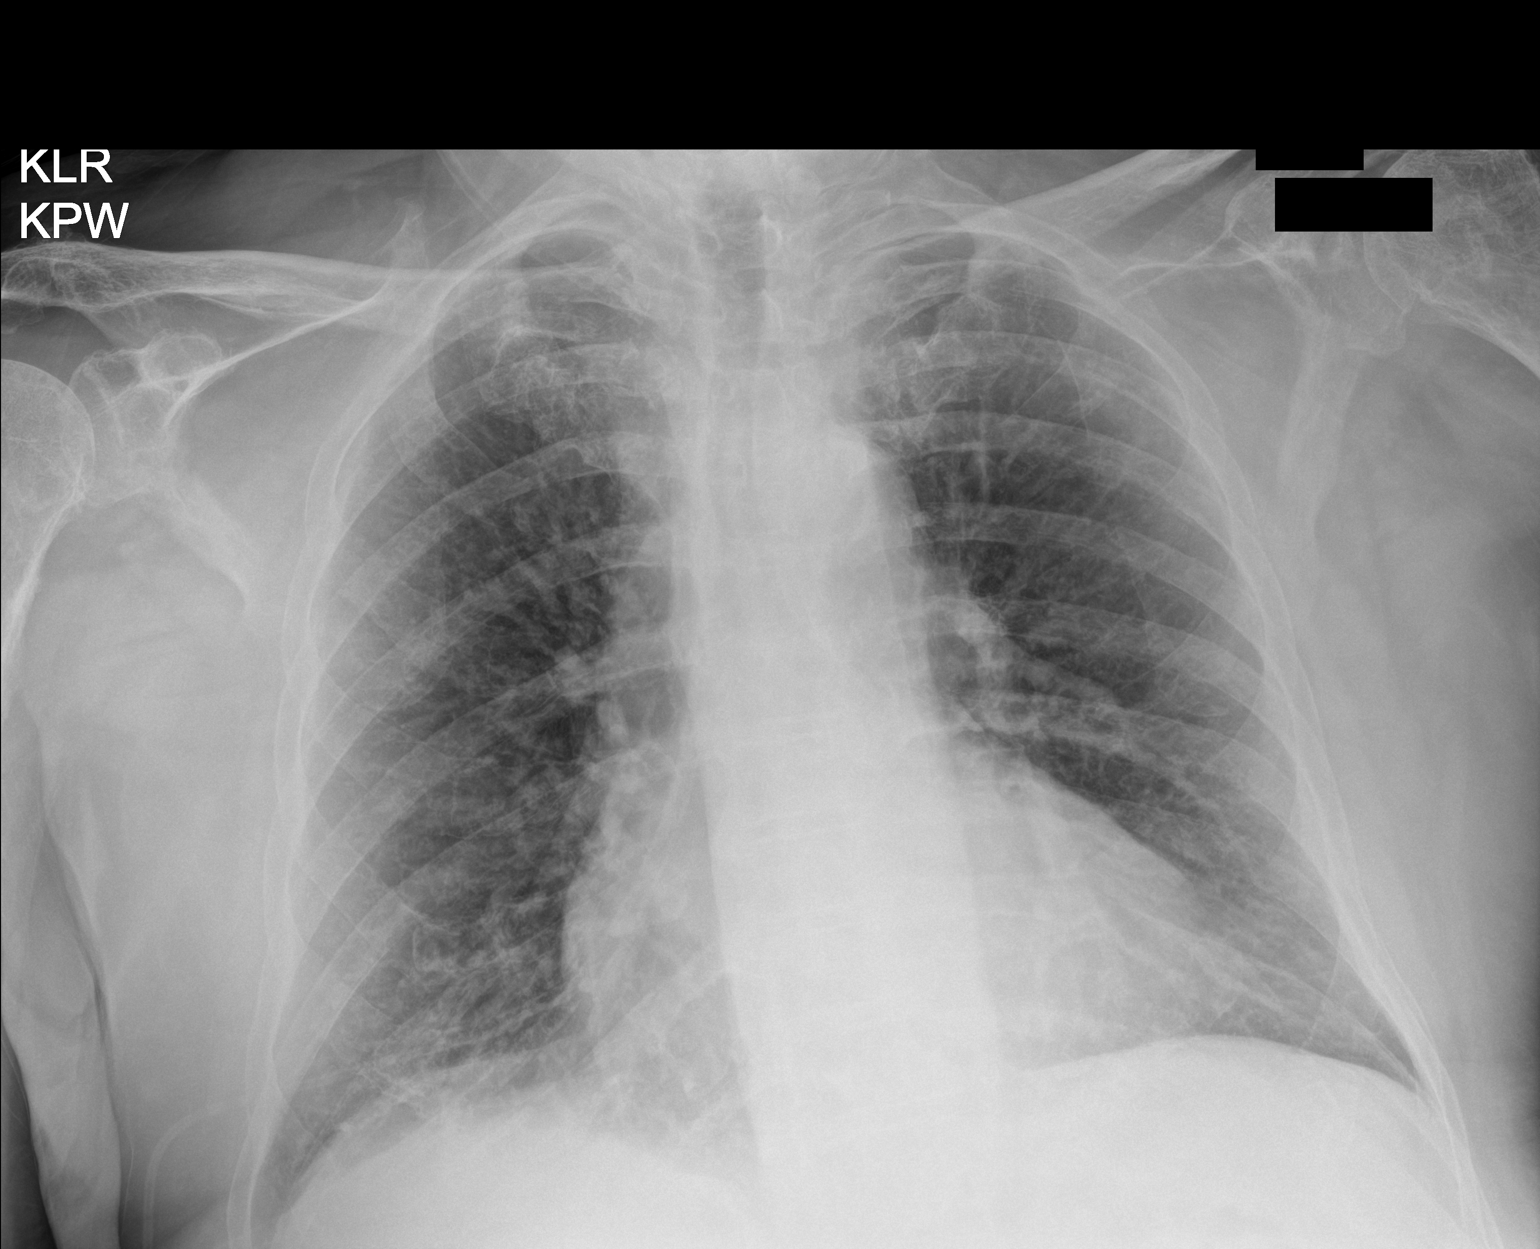
[im 2/2]
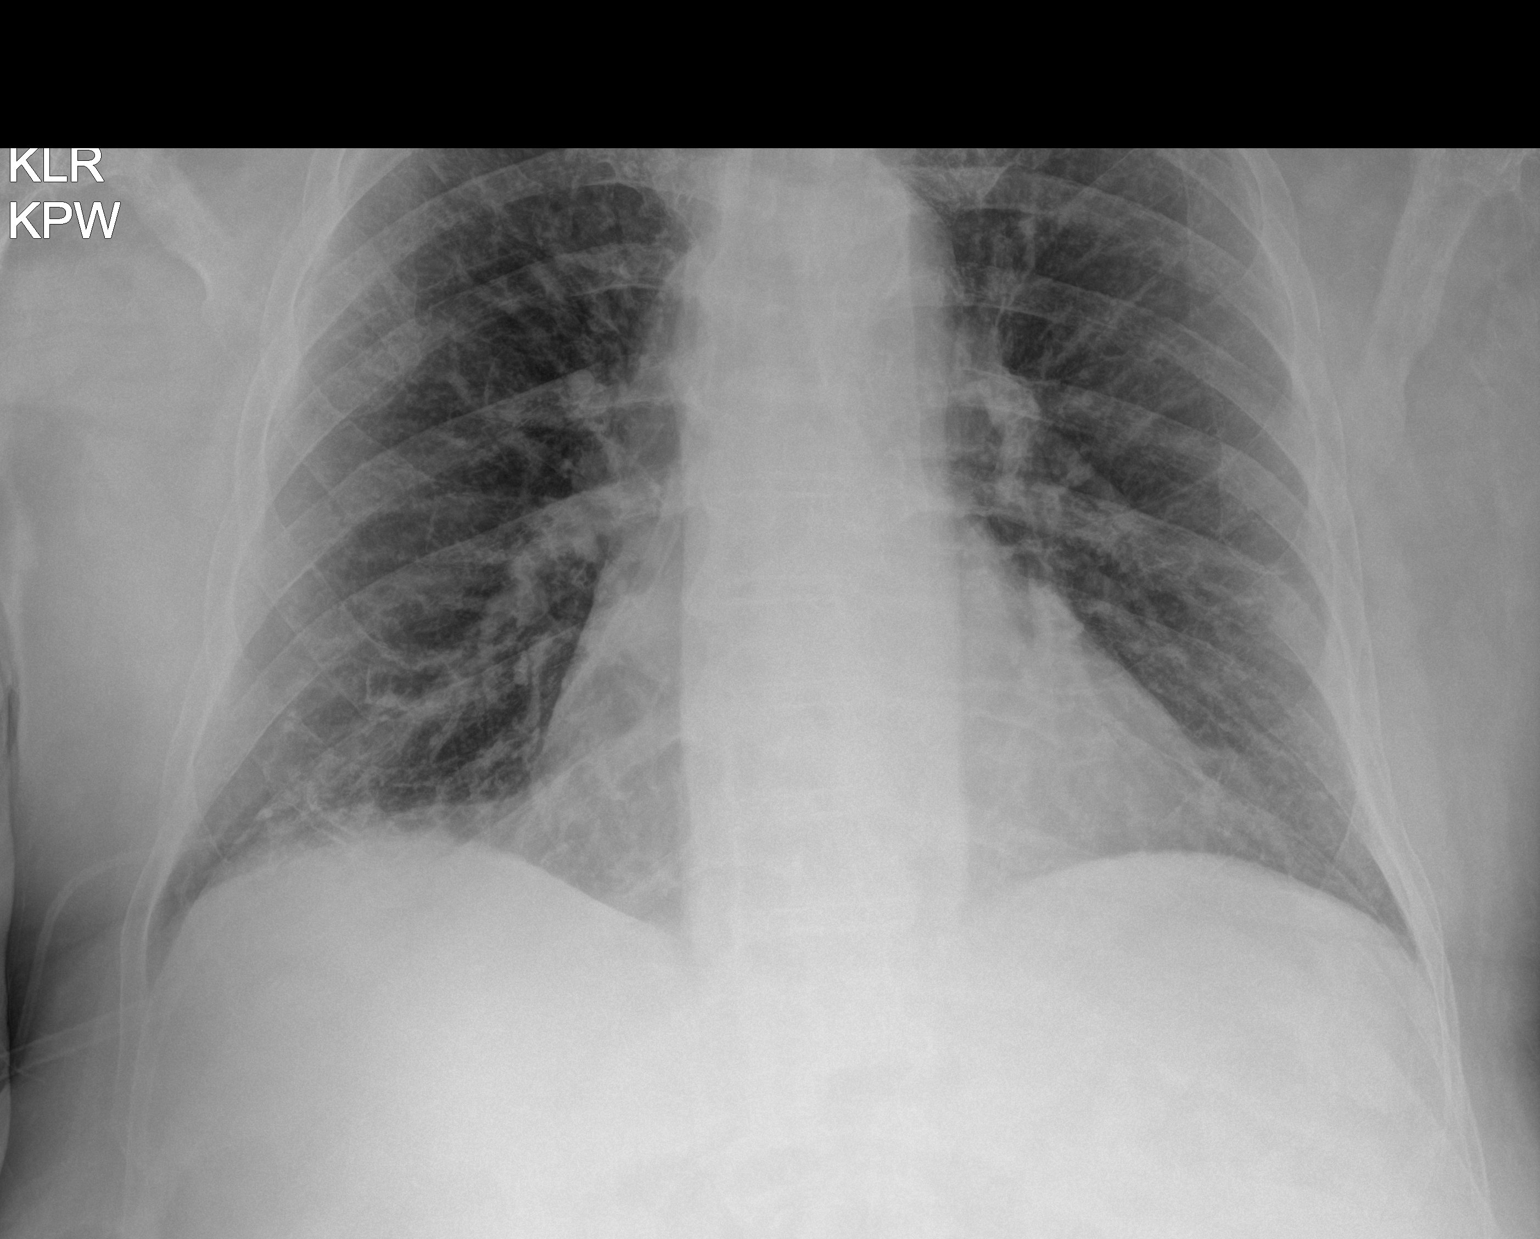

[2 of 2 positions shown; findings below may reference images not displayed]

FINDINGS: Heart size upper limits of normal. No consolidation or effusion.
Diffuse interstitial opacity. No pneumothorax. Minimal atelectasis
right base.
IMPRESSION: Mild diffuse increased interstitial opacity suggesting interstitial
inflammatory process. Mild atelectasis at the right base

## 2020-12-09 IMAGING — US VENOUS DOPPLER ULTRASOUND OF  LOWER EXTREMITIES
1 series · 13 of 24 positions shown · non-contrast
Comparison: None.

CLINICAL DATA: 84-year-old male with lower extremity edema and
color changes with no known injury.



[Series 1: venous doppler ultrasound of lower extremities · 0.08mm/px · 13 of 70 slices shown]
[im 1/70]
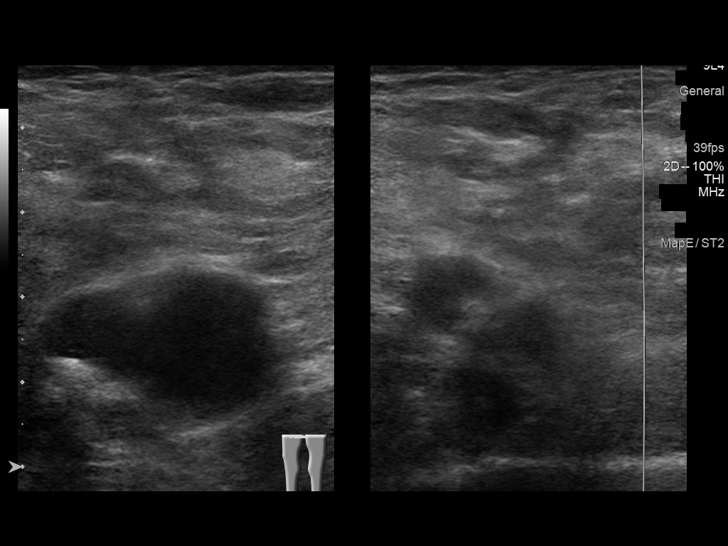
[im 7/70]
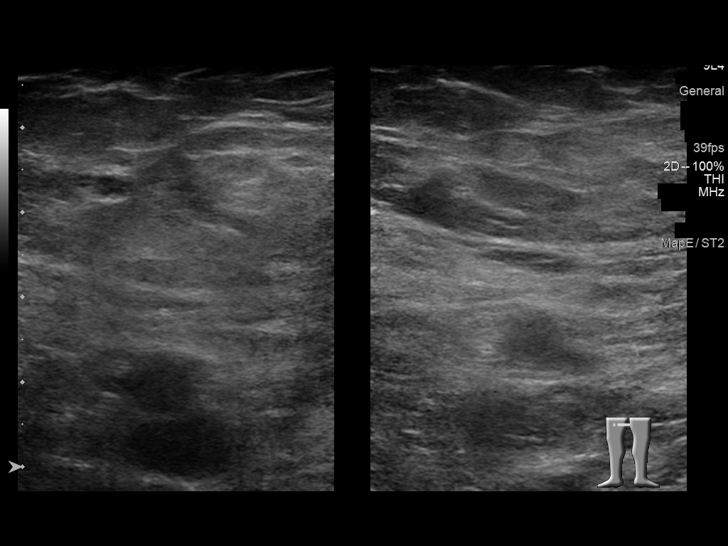
[im 13/70]
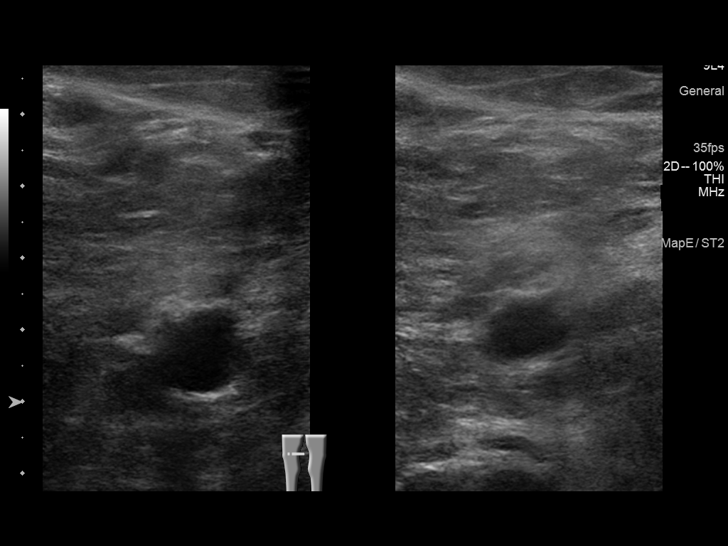
[im 19/70]
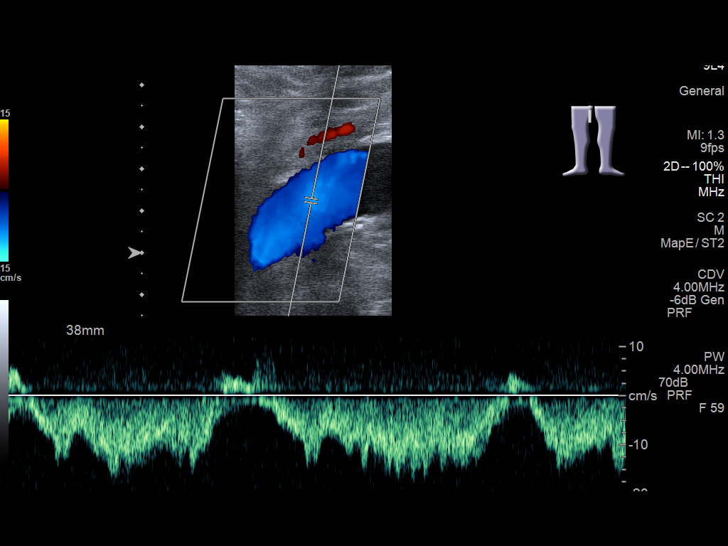
[im 25/70]
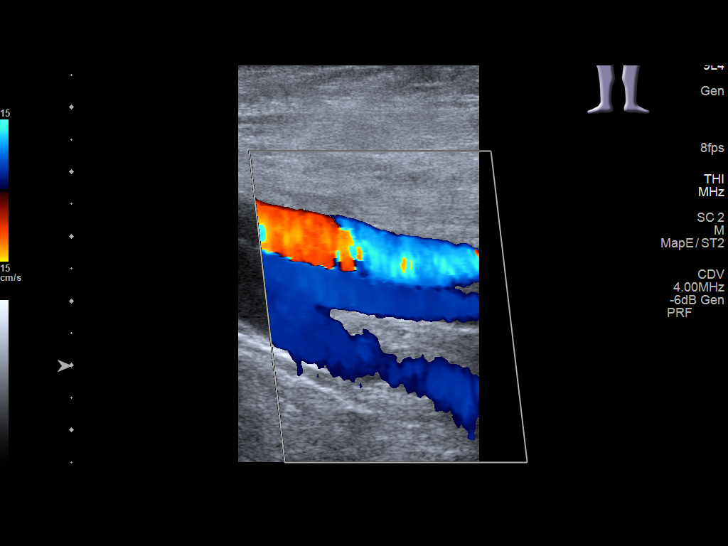
[im 31/70]
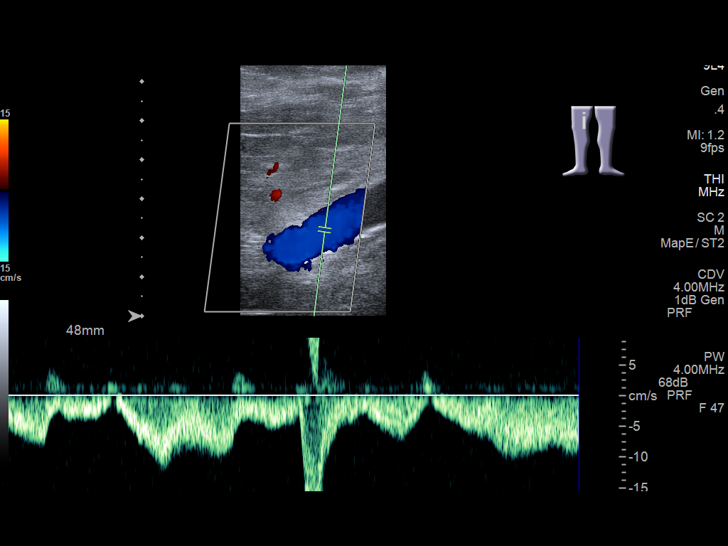
[im 37/70]
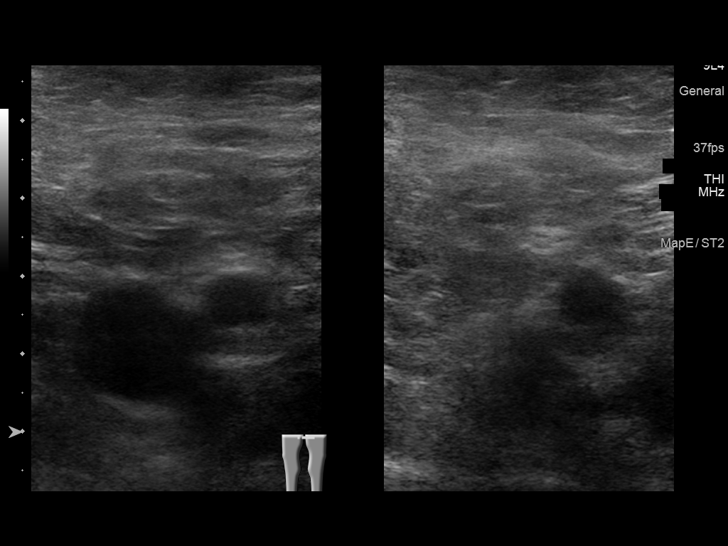
[im 40/70]
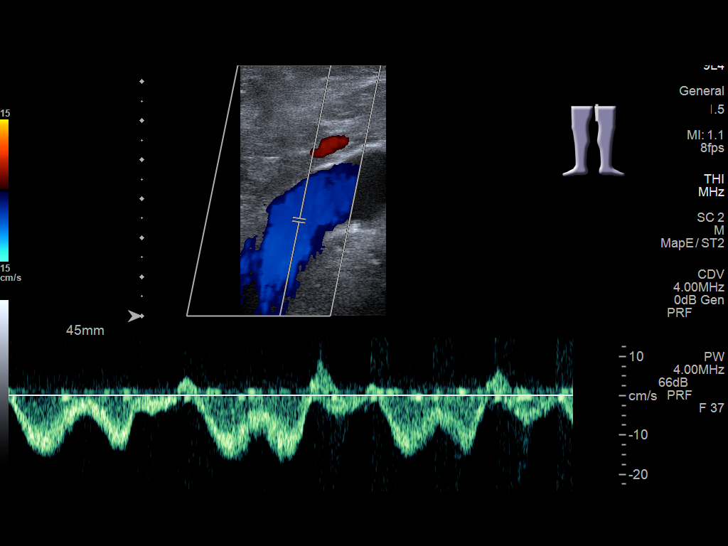
[im 46/70]
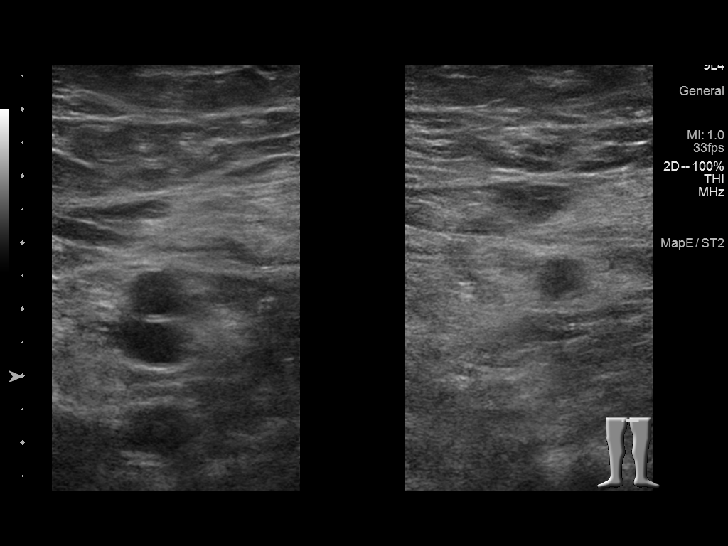
[im 52/70]
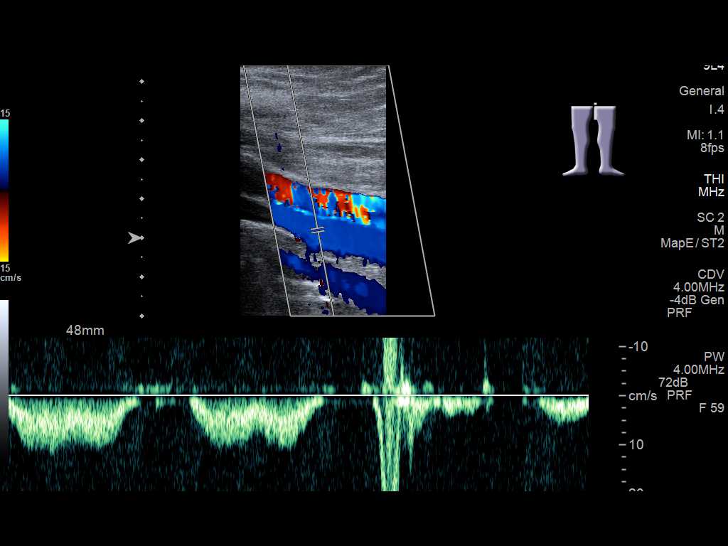
[im 58/70]
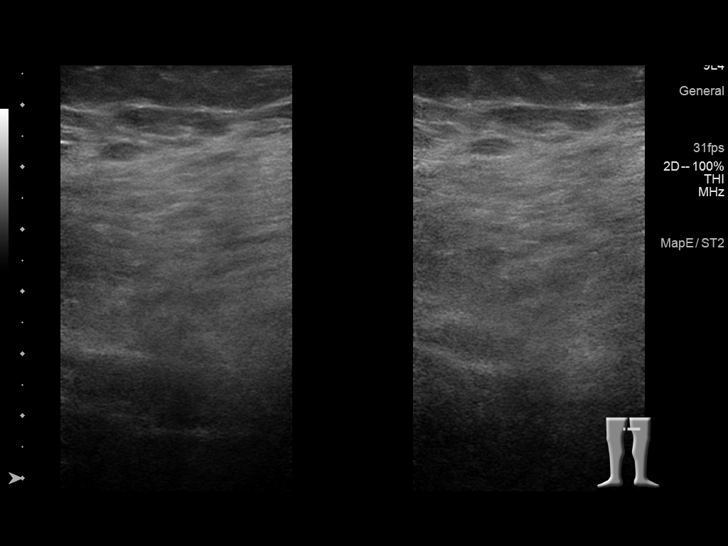
[im 64/70]
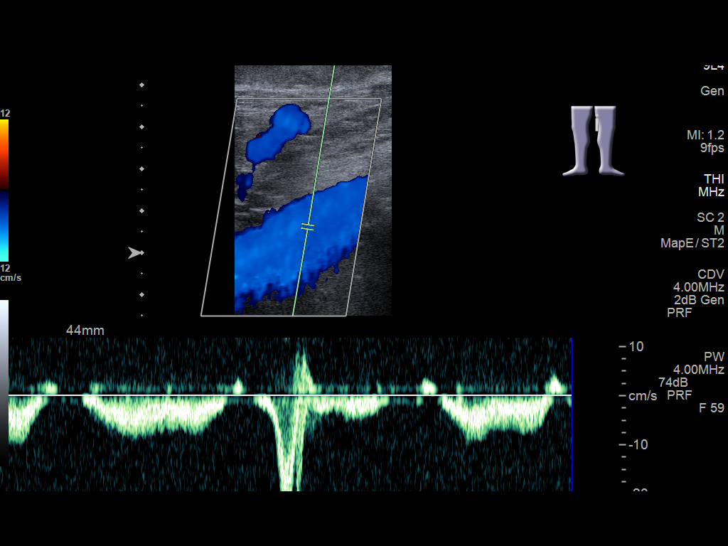
[im 70/70]
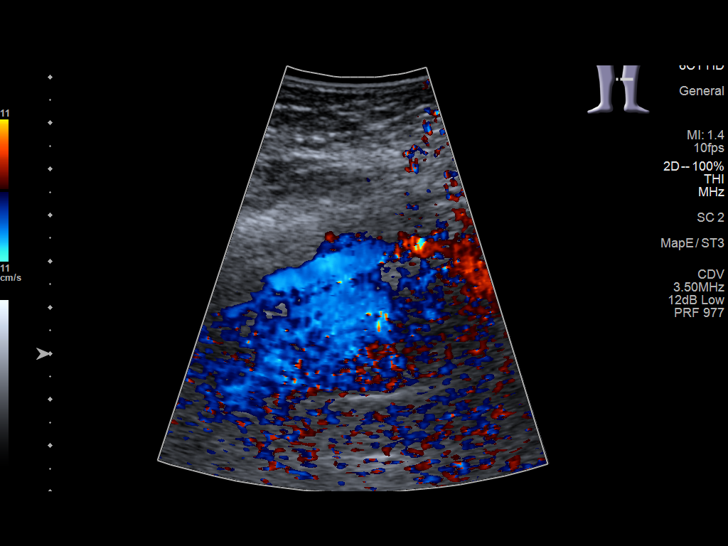

[13 of 24 positions shown; findings below may reference images not displayed]

FINDINGS: RIGHT LOWER EXTREMITY

Common Femoral Vein: No evidence of thrombus. Normal
compressibility, respiratory phasicity and response to augmentation.

Saphenofemoral Junction: No evidence of thrombus. Normal
compressibility and flow on color Doppler imaging.

Profunda Femoral Vein: No evidence of thrombus. Normal
compressibility and flow on color Doppler imaging.

Femoral Vein: No evidence of thrombus. Normal compressibility,
respiratory phasicity and response to augmentation.

Popliteal Vein: No evidence of thrombus. Normal compressibility,
respiratory phasicity and response to augmentation.

Calf Veins: Visible are without evidence of thrombus. Normal
compressibility and flow on color Doppler imaging.

Other Findings:  None.

LEFT LOWER EXTREMITY

Common Femoral Vein: No evidence of thrombus. Normal
compressibility, respiratory phasicity and response to augmentation.

Saphenofemoral Junction: No evidence of thrombus. Normal
compressibility and flow on color Doppler imaging.

Profunda Femoral Vein: No evidence of thrombus. Normal
compressibility and flow on color Doppler imaging.

Femoral Vein: No evidence of thrombus. Normal compressibility,
respiratory phasicity and response to augmentation.

Popliteal Vein: No evidence of thrombus. Normal compressibility,
respiratory phasicity and response to augmentation.

Calf Veins: Visible are without evidence of thrombus. Normal
compressibility and flow on color Doppler imaging.

Other Findings:  None.
IMPRESSION: No evidence of deep venous thrombosis in either lower extremity.

## 2020-12-22 ENCOUNTER — Inpatient Hospital Stay: Payer: Medicare HMO | Attending: Oncology

## 2020-12-22 ENCOUNTER — Inpatient Hospital Stay (HOSPITAL_BASED_OUTPATIENT_CLINIC_OR_DEPARTMENT_OTHER): Payer: Medicare HMO | Admitting: Oncology

## 2020-12-22 ENCOUNTER — Other Ambulatory Visit: Payer: Self-pay

## 2020-12-22 ENCOUNTER — Encounter: Payer: Self-pay | Admitting: Oncology

## 2020-12-22 ENCOUNTER — Inpatient Hospital Stay: Payer: Medicare HMO

## 2020-12-22 VITALS — BP 138/51 | HR 62 | Temp 97.5°F | Resp 16 | Wt 178.7 lb

## 2020-12-22 DIAGNOSIS — E1122 Type 2 diabetes mellitus with diabetic chronic kidney disease: Secondary | ICD-10-CM | POA: Insufficient documentation

## 2020-12-22 DIAGNOSIS — D631 Anemia in chronic kidney disease: Secondary | ICD-10-CM

## 2020-12-22 DIAGNOSIS — Z836 Family history of other diseases of the respiratory system: Secondary | ICD-10-CM | POA: Diagnosis not present

## 2020-12-22 DIAGNOSIS — F17220 Nicotine dependence, chewing tobacco, uncomplicated: Secondary | ICD-10-CM | POA: Insufficient documentation

## 2020-12-22 DIAGNOSIS — Z79899 Other long term (current) drug therapy: Secondary | ICD-10-CM | POA: Insufficient documentation

## 2020-12-22 DIAGNOSIS — E611 Iron deficiency: Secondary | ICD-10-CM | POA: Insufficient documentation

## 2020-12-22 DIAGNOSIS — N184 Chronic kidney disease, stage 4 (severe): Secondary | ICD-10-CM | POA: Insufficient documentation

## 2020-12-22 DIAGNOSIS — Z809 Family history of malignant neoplasm, unspecified: Secondary | ICD-10-CM | POA: Insufficient documentation

## 2020-12-22 DIAGNOSIS — M549 Dorsalgia, unspecified: Secondary | ICD-10-CM | POA: Diagnosis not present

## 2020-12-22 DIAGNOSIS — D509 Iron deficiency anemia, unspecified: Secondary | ICD-10-CM

## 2020-12-22 DIAGNOSIS — R5383 Other fatigue: Secondary | ICD-10-CM | POA: Insufficient documentation

## 2020-12-22 DIAGNOSIS — Z87891 Personal history of nicotine dependence: Secondary | ICD-10-CM | POA: Diagnosis not present

## 2020-12-22 DIAGNOSIS — N189 Chronic kidney disease, unspecified: Secondary | ICD-10-CM

## 2020-12-22 LAB — CBC WITH DIFFERENTIAL/PLATELET
Abs Immature Granulocytes: 0.03 10*3/uL (ref 0.00–0.07)
Basophils Absolute: 0.1 10*3/uL (ref 0.0–0.1)
Basophils Relative: 1 %
Eosinophils Absolute: 0.2 10*3/uL (ref 0.0–0.5)
Eosinophils Relative: 3 %
HCT: 27.1 % — ABNORMAL LOW (ref 39.0–52.0)
Hemoglobin: 7.9 g/dL — ABNORMAL LOW (ref 13.0–17.0)
Immature Granulocytes: 1 %
Lymphocytes Relative: 22 %
Lymphs Abs: 1.3 10*3/uL (ref 0.7–4.0)
MCH: 28.2 pg (ref 26.0–34.0)
MCHC: 29.2 g/dL — ABNORMAL LOW (ref 30.0–36.0)
MCV: 96.8 fL (ref 80.0–100.0)
Monocytes Absolute: 0.6 10*3/uL (ref 0.1–1.0)
Monocytes Relative: 10 %
Neutro Abs: 3.9 10*3/uL (ref 1.7–7.7)
Neutrophils Relative %: 63 %
Platelets: 179 10*3/uL (ref 150–400)
RBC: 2.8 MIL/uL — ABNORMAL LOW (ref 4.22–5.81)
RDW: 20.1 % — ABNORMAL HIGH (ref 11.5–15.5)
WBC: 6.1 10*3/uL (ref 4.0–10.5)
nRBC: 0 % (ref 0.0–0.2)

## 2020-12-22 LAB — COMPREHENSIVE METABOLIC PANEL
ALT: 9 U/L (ref 0–44)
AST: 12 U/L — ABNORMAL LOW (ref 15–41)
Albumin: 3.8 g/dL (ref 3.5–5.0)
Alkaline Phosphatase: 68 U/L (ref 38–126)
Anion gap: 7 (ref 5–15)
BUN: 50 mg/dL — ABNORMAL HIGH (ref 8–23)
CO2: 26 mmol/L (ref 22–32)
Calcium: 8.3 mg/dL — ABNORMAL LOW (ref 8.9–10.3)
Chloride: 102 mmol/L (ref 98–111)
Creatinine, Ser: 2.01 mg/dL — ABNORMAL HIGH (ref 0.61–1.24)
GFR, Estimated: 32 mL/min — ABNORMAL LOW (ref >60)
Glucose, Bld: 78 mg/dL (ref 70–99)
Potassium: 4.2 mmol/L (ref 3.5–5.1)
Sodium: 135 mmol/L (ref 135–145)
Total Bilirubin: 0.3 mg/dL (ref 0.3–1.2)
Total Protein: 7.1 g/dL (ref 6.5–8.1)

## 2020-12-22 LAB — RETIC PANEL
Immature Retic Fract: 9.2 % (ref 2.3–15.9)
RBC.: 2.76 MIL/uL — ABNORMAL LOW (ref 4.22–5.81)
Retic Count, Absolute: 16 10*3/uL — ABNORMAL LOW (ref 19.0–186.0)
Retic Ct Pct: 0.6 % (ref 0.4–3.1)
Reticulocyte Hemoglobin: 26 pg — ABNORMAL LOW (ref >27.9)

## 2020-12-22 MED ORDER — EPOETIN ALFA-EPBX 40000 UNIT/ML IJ SOLN: 60000.0000 [IU] | Freq: Once | INTRAMUSCULAR | Status: DC

## 2020-12-22 MED ORDER — EPOETIN ALFA-EPBX 10000 UNIT/ML IJ SOLN
20000.0000 [IU] | Freq: Once | INTRAMUSCULAR | Status: AC
  Administered 2020-12-22: 15:00:00 20000 [IU] via SUBCUTANEOUS
  Filled 2020-12-22: qty 2

## 2020-12-22 MED ORDER — EPOETIN ALFA-EPBX 40000 UNIT/ML IJ SOLN
40000.0000 [IU] | Freq: Once | INTRAMUSCULAR | Status: AC
  Administered 2020-12-22: 15:00:00 40000 [IU] via SUBCUTANEOUS
  Filled 2020-12-22: qty 1

## 2020-12-23 ENCOUNTER — Encounter: Payer: Self-pay | Admitting: Oncology

## 2020-12-23 NOTE — Progress Notes (Signed)
Hematology/Oncology progress note [phone] [phone]   Patient Care Team: Baxter Hire, MD as PCP - General (Internal Medicine) Earlie Server, MD as Consulting Physician (Oncology) Earlie Server, MD as Consulting Physician (Hematology and Oncology)  REFERRING PROVIDER: Baxter Hire, MD  CHIEF COMPLAINTS/REASON FOR VISIT:  Follow-up for anemia secondary to chronic kidney disease.  Possible MDS   INTERVAL HISTORY Arthur [name] is a 87 y.o. male who has above history reviewed by me today presents for follow up visit for management of anemia secondary to chronic kidney disease, possible underlying MDS Problems and complaints are listed below: Accompanied by his daughter.  Chronic fatigue, at baseline. Back pain is stable.   Review of Systems  Constitutional:  Positive for fatigue. Negative for appetite change, chills, fever and unexpected weight change.  HENT:   Negative for hearing loss and voice change.   Eyes:  Negative for eye problems and icterus.  Respiratory:  Negative for chest tightness, cough and shortness of breath.   Cardiovascular:  Negative for chest pain and leg swelling.  Gastrointestinal:  Negative for abdominal distention and abdominal pain.  Endocrine: Negative for hot flashes.  Genitourinary:  Negative for difficulty urinating, dysuria and frequency.   Musculoskeletal:  Negative for arthralgias.       Chronic back pain  Skin:  Negative for itching and rash.  Neurological:  Negative for light-headedness and numbness.  Hematological:  Negative for adenopathy. Does not bruise/bleed easily.  Psychiatric/Behavioral:  Negative for confusion.    MEDICAL HISTORY:  Past Medical History:  Diagnosis Date   Anemia of chronic kidney failure 08/21/2018   Arthritis    lower back   CHF (congestive heart failure) (HCC)    CKD (chronic kidney disease), stage III (HCC)    COPD (chronic obstructive pulmonary disease) ([place])    Diabetes mellitus without  complication ([place])    type 2   Dyspnea    when active -wears oxygen 2L via [place]   History of blood transfusion    Hyperlipidemia    Hypertension    Requires continuous at home supplemental oxygen    2L via [place]   Squamous cell carcinoma of skin 02/03/2020   R lat cheek, EDC    SURGICAL HISTORY: Past Surgical History:  Procedure Laterality Date   COLONOSCOPY WITH PROPOFOL N/A 06/11/2018   Procedure: COLONOSCOPY WITH PROPOFOL;  Surgeon: Jonathon Bellows, MD;  Location: [facility] ENDOSCOPY;  Service: Gastroenterology;  Laterality: N/A;   COLONOSCOPY WITH PROPOFOL N/A 06/13/2018   Procedure: COLONOSCOPY WITH PROPOFOL;  Surgeon: Jonathon Bellows, MD;  Location: [facility] ENDOSCOPY;  Service: Gastroenterology;  Laterality: N/A;   ESOPHAGOGASTRODUODENOSCOPY Left 06/10/2018   Procedure: ESOPHAGOGASTRODUODENOSCOPY (EGD);  Surgeon: Jonathon Bellows, MD;  Location: [facility] ENDOSCOPY;  Service: Gastroenterology;  Laterality: Left;   EYE SURGERY     removed cataracts   GIVENS CAPSULE STUDY N/A 06/28/2018   Procedure: GIVENS CAPSULE STUDY;  Surgeon: Lucilla Lame, MD;  Location: [facility] ENDOSCOPY;  Service: Endoscopy;  Laterality: N/A;   GIVENS CAPSULE STUDY N/A 06/30/2018   Procedure: GIVENS CAPSULE STUDY;  Surgeon: Jonathon Bellows, MD;  Location: [facility] ENDOSCOPY;  Service: Gastroenterology;  Laterality: N/A;   KYPHOPLASTY N/A 10/31/2018   Procedure: KYPHOPLASTY L2;  Surgeon: [name], MD;  Location: [facility] ORS;  Service: Orthopedics;  Laterality: N/A;   KYPHOPLASTY N/A 12/24/2018   Procedure: KYPHOPLASTY L3;  Surgeon: [name], MD;  Location: [facility] ORS;  Service: Orthopedics;  Laterality: N/A;   KYPHOPLASTY N/A 02/18/2019   Procedure: L2 KYPHOPLASTY;  Surgeon: [name], MD;  Location: [facility] ORS;  Service: Orthopedics;  Laterality: N/A;   RADIOLOGY WITH ANESTHESIA N/A 01/28/2019   Procedure: MRI LUMBER SPINE WITHOUT CONTRAST;  Surgeon: Radiologist, Medication, MD;  Location: [place];  Service: Radiology;  Laterality: N/A;    SOCIAL  HISTORY: Social History   Socioeconomic History   Marital status: Widowed    Spouse name: Not on file   Number of children: Not on file   Years of education: Not on file   Highest education level: Not on file  Occupational History   Not on file  Tobacco Use   Smoking status: Former    Packs/day: 2.00    Years: 35.00    Pack years: 70.00    Types: Cigarettes    Quit date: 11/04/1982    Years since quitting: 38.1   Smokeless tobacco: Current    Types: Chew  Vaping Use   Vaping Use: Never used  Substance and Sexual Activity   Alcohol use: Not Currently   Drug use: No   Sexual activity: Not Currently  Other Topics Concern   Not on file  Social History Narrative   Not on file   Social Determinants of Health   Financial Resource Strain: Not on file  Food Insecurity: Not on file  Transportation Needs: Not on file  Physical Activity: Not on file  Stress: Not on file  Social Connections: Not on file  Intimate Partner Violence: Not on file    FAMILY HISTORY: Family History  Problem Relation Age of Onset   COPD Mother    Cancer Father     ALLERGIES:  is allergic to codeine.  MEDICATIONS:  Current Outpatient Medications  Medication Sig Dispense Refill   acetaminophen (TYLENOL) 325 MG tablet Take 2 tablets (650 mg total) by mouth every 6 (six) hours as needed for mild pain (or Fever >/= 101).     albuterol (VENTOLIN HFA) 108 (90 Base) MCG/ACT inhaler Inhale 2 puffs into the lungs every 6 (six) hours as needed for wheezing or shortness of breath. 1 Inhaler 2   amLODipine (NORVASC) 2.5 MG tablet Take 2.5 mg by mouth daily.     ascorbic acid (VITAMIN C) 500 MG tablet Take 500 mg by mouth at bedtime.     insulin aspart (NOVOLOG) 100 UNIT/ML injection 4 units prior to meals if sugars above 200 10 mL 11   LEVEMIR 100 UNIT/ML injection Inject 0.04 mLs (4 Units total) into the skin at bedtime. 10 mL 0   pantoprazole (PROTONIX) 40 MG tablet Take 1 tablet (40 mg total) by mouth  daily. (Patient taking differently: Take 40 mg by mouth 2 (two) times daily.)     potassium chloride (KLOR-CON) 10 MEQ tablet Take 10 mEq by mouth every evening.     pravastatin (PRAVACHOL) 10 MG tablet Take 10 mg by mouth at bedtime.     tamsulosin (FLOMAX) 0.4 MG CAPS capsule Take 0.4 mg by mouth daily.     tiotropium (SPIRIVA) 18 MCG inhalation capsule Place 18 mcg into inhaler and inhale daily.     torsemide (DEMADEX) 20 MG tablet Take 20 mg by mouth every Monday, Wednesday, and Friday.     triamcinolone cream (KENALOG) 0.5 %      TRUE METRIX BLOOD GLUCOSE TEST test strip 1 each by Other route every morning.     docusate sodium (COLACE) 100 MG capsule Take 1 capsule (100 mg total) by mouth 2 (two) times daily. (Patient not taking: Reported on 09/29/2020)  60 capsule 0   metoCLOPramide (REGLAN) 5 MG tablet Take 5 mg by mouth 4 (four) times daily -  before meals and at bedtime. (Patient not taking: Reported on 03/31/2020)     polyethylene glycol powder (GLYCOLAX/MIRALAX) 17 GM/SCOOP powder Take 17 g by mouth every other day.  (Patient not taking: Reported on 03/31/2020)     promethazine (PHENERGAN) 25 MG tablet Take 25 mg by mouth every 4 (four) hours as needed for nausea or vomiting. (Patient not taking: Reported on 03/31/2020)     No current facility-administered medications for this visit.   Facility-Administered Medications Ordered in Other Visits  Medication Dose Route Frequency Provider Last Rate Last Admin   0.9 %  sodium chloride infusion   Intravenous Continuous Earlie Server, MD 10 mL/hr at 10/15/20 1452 New Bag at 10/15/20 1452     PHYSICAL EXAMINATION: ECOG PERFORMANCE STATUS: 3 - Symptomatic, >50% confined to bed Vitals:   12/22/20 1425  BP: (!) 138/51  Pulse: 62  Resp: 16  Temp: (!) 97.5 F (36.4 C)   Filed Weights   12/22/20 1425  Weight: 178 lb 11.2 oz (81.1 kg)    Physical Exam Constitutional:      General: He is not in acute distress.    Appearance: He is  ill-appearing.     Comments: Sits in wheel chair  HENT:     Head: Normocephalic and atraumatic.  Eyes:     General: No scleral icterus.    Pupils: Pupils are equal, round, and reactive to light.  Cardiovascular:     Rate and Rhythm: Normal rate and regular rhythm.     Heart sounds: Normal heart sounds.  Pulmonary:     Effort: Pulmonary effort is normal. No respiratory distress.     Breath sounds: No wheezing.     Comments:  Decreased breath sounds at right base Abdominal:     General: Bowel sounds are normal. There is no distension.     Palpations: Abdomen is soft. There is no mass.     Tenderness: There is no abdominal tenderness.  Musculoskeletal:        General: No deformity.     Cervical back: Normal range of motion and neck supple.     Comments: Chronic bilateral lower extremity edema.    Skin:    General: Skin is warm and dry.     Coloration: Skin is not pale.     Findings: No erythema or rash.  Neurological:     Mental Status: He is alert. Mental status is at baseline.     Cranial Nerves: No cranial nerve deficit.     Coordination: Coordination normal.  Psychiatric:        Mood and Affect: Mood normal.    LABORATORY DATA:  I have reviewed the data as listed Lab Results  Component Value Date   WBC 6.1 12/22/2020   HGB 7.9 (L) 12/22/2020   HCT 27.1 (L) 12/22/2020   MCV 96.8 12/22/2020   PLT 179 12/22/2020   Recent Labs    02/13/20 1035 02/27/20 0807 12/22/20 1410  NA 137 134* 135  K 4.3 4.1 4.2  CL 103 101 102  CO2 _0 GLUCOSE 215* 225* 78  BUN 48* 46* 50*  CREATININE 2.19* 2.05* 2.01*  CALCIUM 8.3* 8.2* 8.3*  GFRNONAA 29* 31* 32*  PROT 6.6 7.2 7.1  ALBUMIN 3.6 3.9 3.8  AST 11* 12* 12*  ALT _1 ALKPHOS 55 61 68  BILITOT  0.7 0.8 0.3    Iron/TIBC/Ferritin/ %Sat    Component Value Date/Time   IRON 59 03/29/2020 1454   IRON 36 (L) 07/15/2018 1544   TIBC 161 (L) 03/29/2020 1454   TIBC 172 (L) 07/15/2018 1544   FERRITIN 890 (H)  03/29/2020 1454   FERRITIN 535 (H) 07/15/2018 1544   IRONPCTSAT 37 03/29/2020 1454   IRONPCTSAT 21 07/15/2018 1544      RADIOGRAPHIC STUDIES: I have personally reviewed the radiological images as listed and agreed with the findings in the report. No results found.    ASSESSMENT & PLAN:  1. Anemia of chronic renal failure, stage 4 (severe) ([place])   2. CKD (chronic kidney disease) stage 4, GFR 15-29 ml/min (HCC)   3. Iron deficiency anemia, unspecified iron deficiency anemia type     #Anemia of chronic kidney disease,  Suspect MDS cannot be ruled out.  Patient declines bone marrow biopsy due to multiple other medical problems.- ? MDS versus ongoing GI blood loss Retic panel is low at 26, indicating iron deficiency. His iron panel has not been reliable due to chronic inflammation.  Hb has improved, 7.9 today.  Proceed with Retacrit 60,000 units today. Q3 weeks.  IV venofer in 1 week, and 5 weeks,   Will arrange him to get H&H in 3, 6, 9, 12 weeks +/- Retacrit.  Lab CBC, iron TIBC ferritin, MD +/- Retacrit in 15 weeks.  All questions were answered. The patient knows to call the clinic with any problems questions or concerns.   Earlie Server, MD, PhD 12/23/2020

## 2020-12-27 DIAGNOSIS — J441 Chronic obstructive pulmonary disease with (acute) exacerbation: Secondary | ICD-10-CM | POA: Diagnosis not present

## 2020-12-27 IMAGING — DX PORTABLE CHEST - 1 VIEW
1 series · 1 of 1 positions shown · non-contrast
Comparison: May 23, 2008

CLINICAL DATA: Fever

EXAM:
PORTABLE CHEST 1 VIEW

[chest ap]
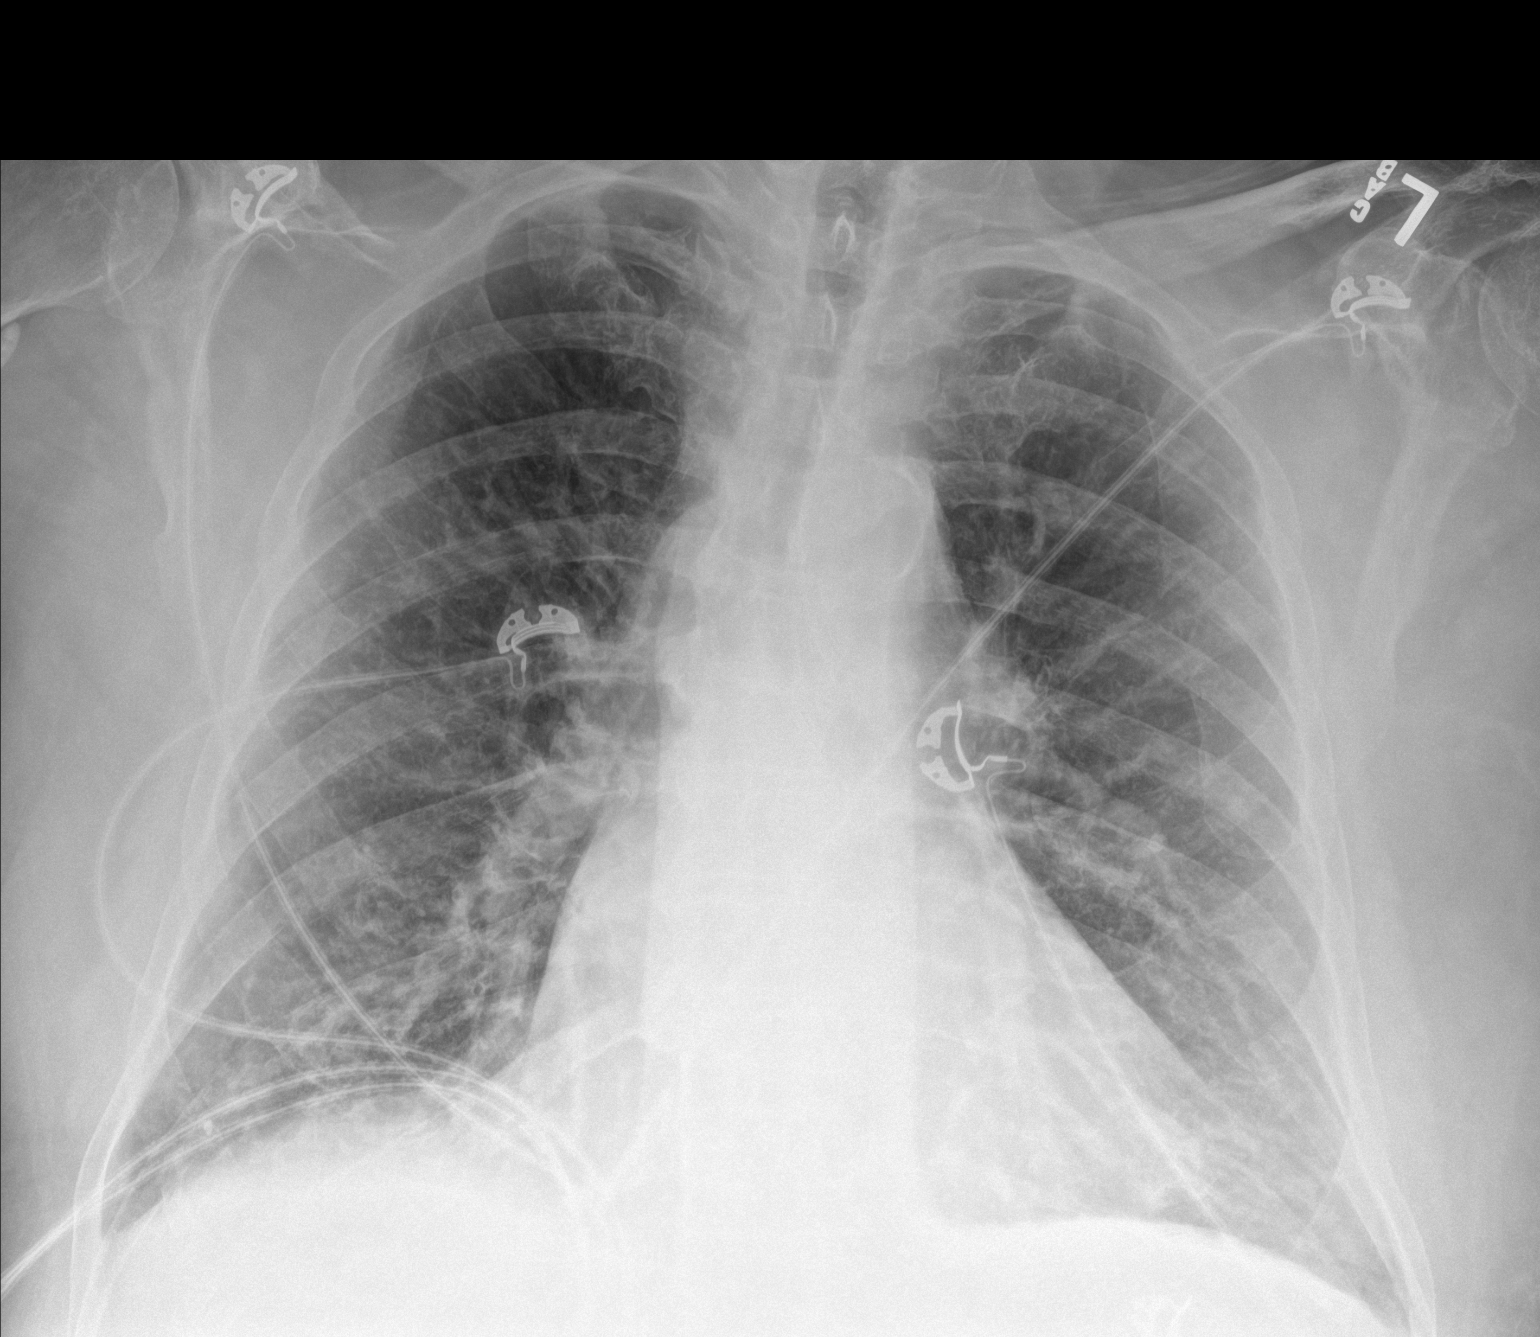

[1 of 1 positions shown; findings below may reference images not displayed]

FINDINGS: There is stable cardiac prominence with pulmonary vascularity
normal. There is interstitial thickening without frank edema or
consolidation. There is aortic atherosclerosis. No adenopathy. There
is degenerative change in each shoulder.
IMPRESSION: Stable cardiac prominence. Interstitial thickening which may reflect
chronic inflammatory type change. No frank edema or consolidation
evident. No adenopathy appreciable. Aortic Atherosclerosis
([id number]).

## 2020-12-28 ENCOUNTER — Other Ambulatory Visit: Payer: Self-pay

## 2020-12-28 ENCOUNTER — Inpatient Hospital Stay: Payer: Medicare HMO

## 2020-12-28 VITALS — BP 125/57 | HR 65 | Temp 98.3°F | Resp 16

## 2020-12-28 DIAGNOSIS — Z87891 Personal history of nicotine dependence: Secondary | ICD-10-CM | POA: Diagnosis not present

## 2020-12-28 DIAGNOSIS — E611 Iron deficiency: Secondary | ICD-10-CM | POA: Diagnosis not present

## 2020-12-28 DIAGNOSIS — F17220 Nicotine dependence, chewing tobacco, uncomplicated: Secondary | ICD-10-CM | POA: Diagnosis not present

## 2020-12-28 DIAGNOSIS — R5383 Other fatigue: Secondary | ICD-10-CM | POA: Diagnosis not present

## 2020-12-28 DIAGNOSIS — Z79899 Other long term (current) drug therapy: Secondary | ICD-10-CM | POA: Diagnosis not present

## 2020-12-28 DIAGNOSIS — D631 Anemia in chronic kidney disease: Secondary | ICD-10-CM | POA: Diagnosis not present

## 2020-12-28 DIAGNOSIS — E1122 Type 2 diabetes mellitus with diabetic chronic kidney disease: Secondary | ICD-10-CM | POA: Diagnosis not present

## 2020-12-28 DIAGNOSIS — M549 Dorsalgia, unspecified: Secondary | ICD-10-CM | POA: Diagnosis not present

## 2020-12-28 DIAGNOSIS — N184 Chronic kidney disease, stage 4 (severe): Secondary | ICD-10-CM | POA: Diagnosis not present

## 2020-12-28 MED ORDER — SODIUM CHLORIDE 0.9 % IV SOLN: 200.0000 mg | Freq: Once | INTRAVENOUS | Status: DC

## 2020-12-28 MED ORDER — IRON SUCROSE 20 MG/ML IV SOLN
200.0000 mg | Freq: Once | INTRAVENOUS | Status: AC
  Administered 2020-12-28: 15:00:00 200 mg via INTRAVENOUS
  Filled 2020-12-28: qty 10

## 2020-12-28 MED ORDER — SODIUM CHLORIDE 0.9 % IV SOLN
INTRAVENOUS | Status: DC
  Filled 2020-12-28: qty 250

## 2020-12-29 ENCOUNTER — Ambulatory Visit: Payer: Medicare HMO

## 2020-12-29 ENCOUNTER — Ambulatory Visit: Payer: Medicare HMO | Admitting: Oncology

## 2020-12-29 ENCOUNTER — Other Ambulatory Visit: Payer: Medicare HMO

## 2020-12-30 IMAGING — US US RENAL
1 series · 14 of 25 positions shown · non-contrast
Comparison: None.

CLINICAL DATA: Acute renal failure

EXAM:
RENAL / URINARY TRACT ULTRASOUND COMPLETE

[Series 1: us renal · 14 of 58 slices shown]
[im 1/58]
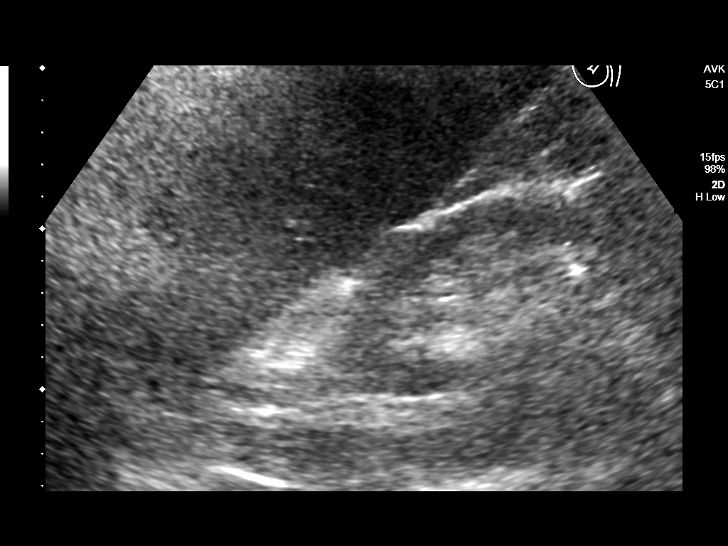
[im 5/58]
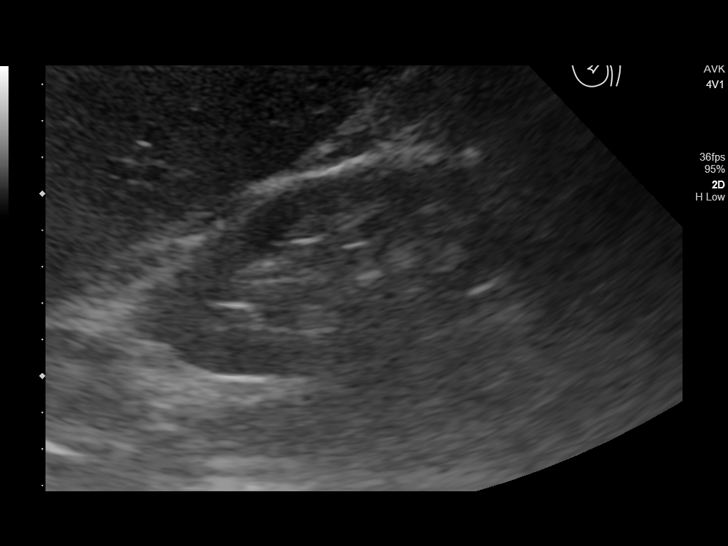
[im 10/58]
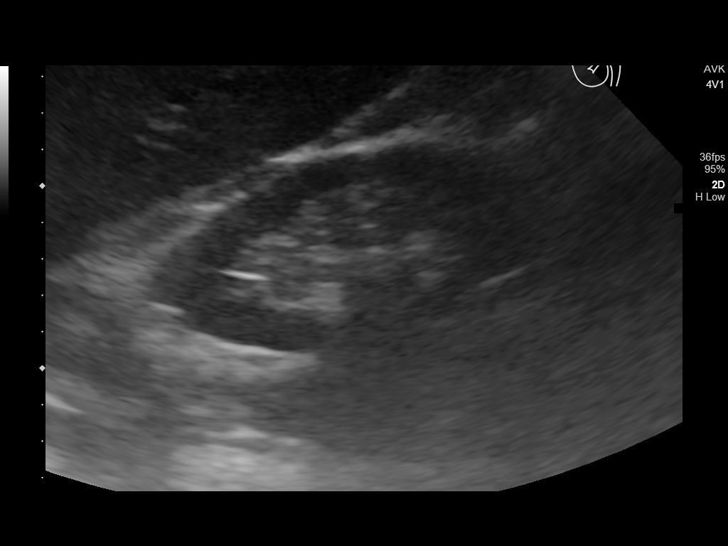
[im 15/58]
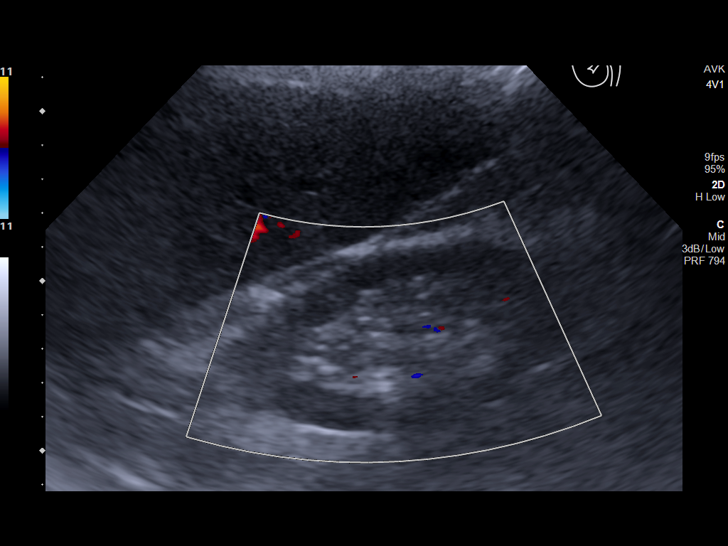
[im 20/58]
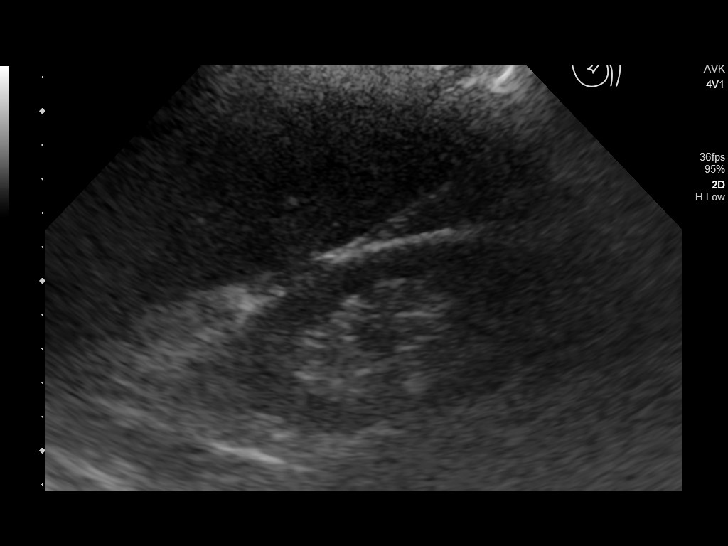
[im 22/58]
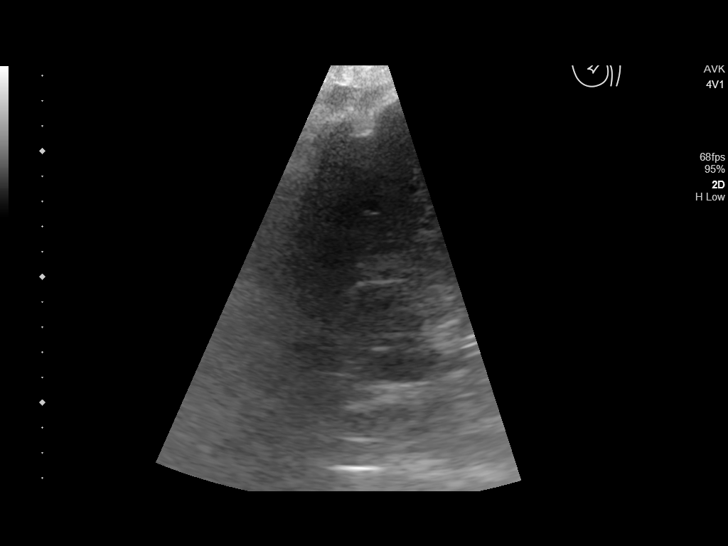
[im 27/58]
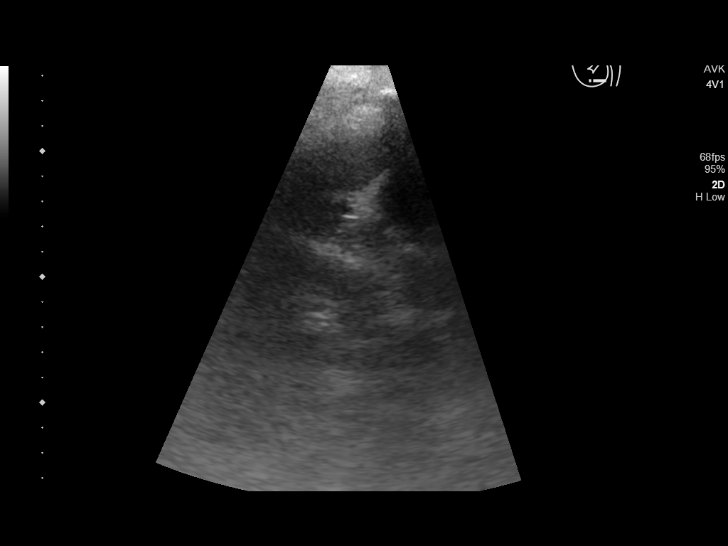
[im 31/58]
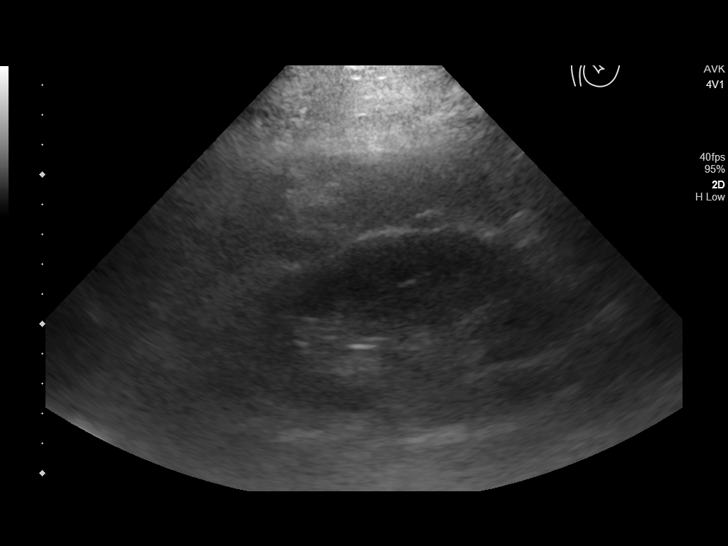
[im 36/58]
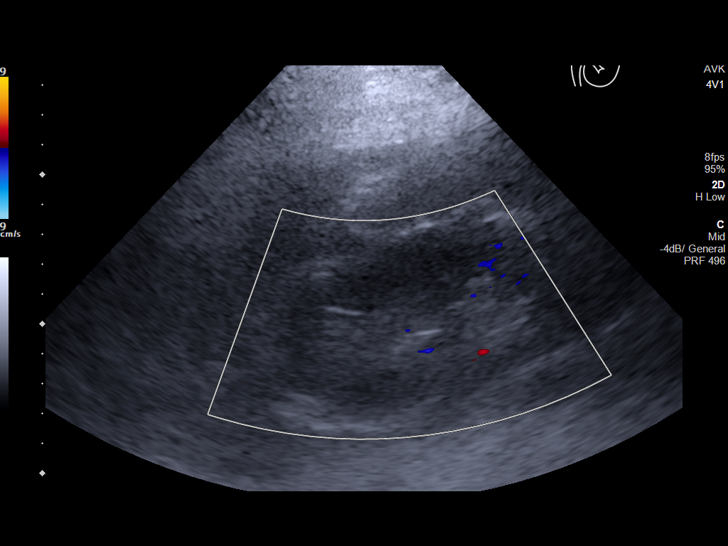
[im 39/58]
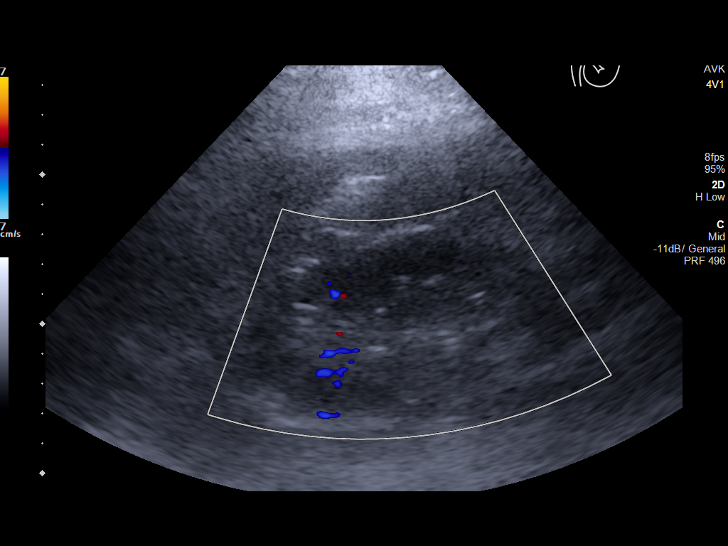
[im 43/58]
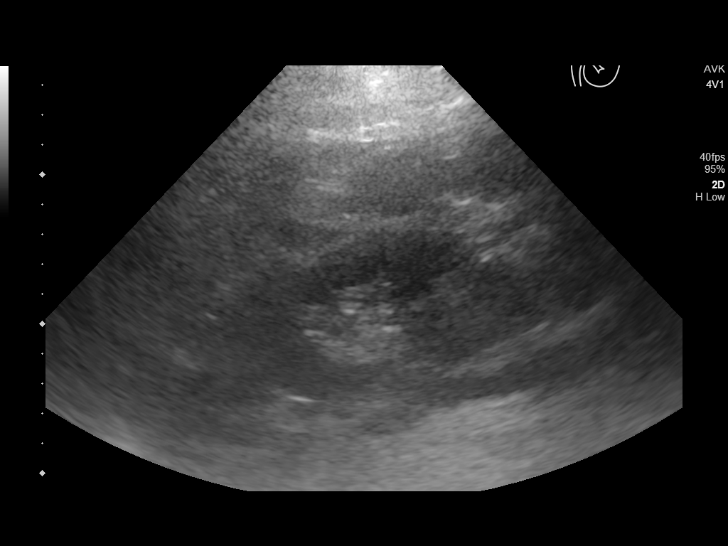
[im 48/58]
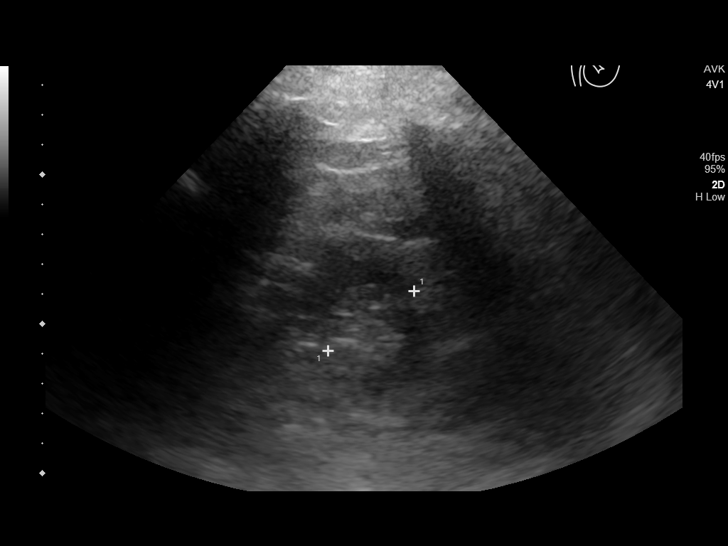
[im 53/58]
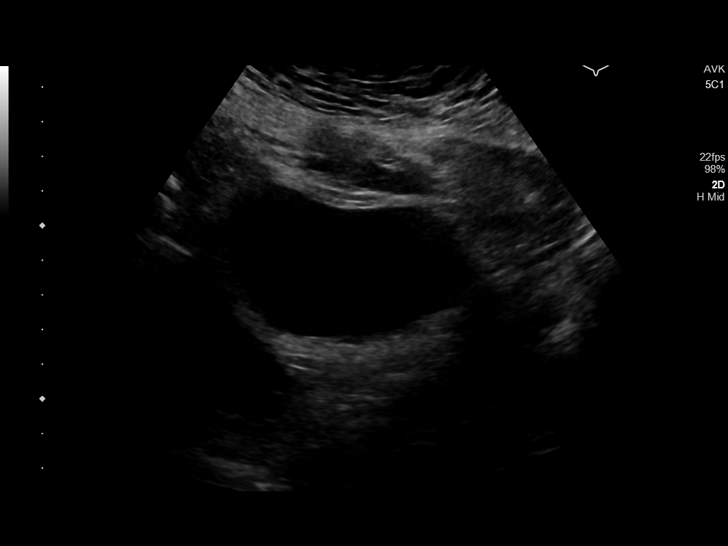
[im 58/58]
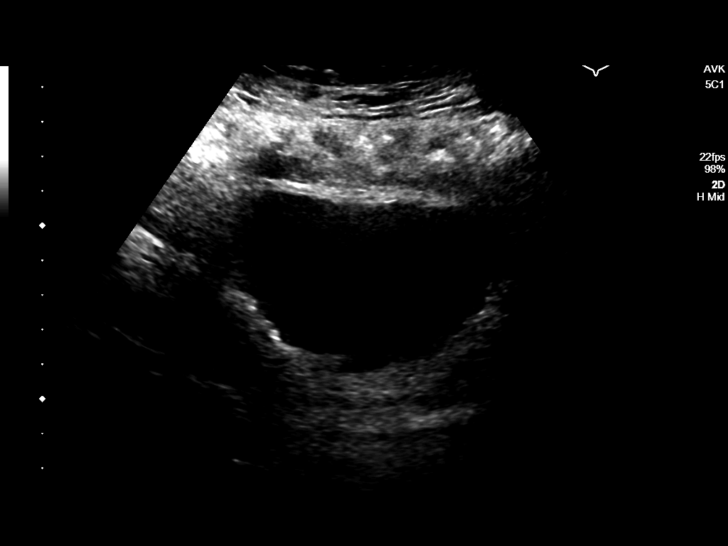

[14 of 25 positions shown; findings below may reference images not displayed]

FINDINGS: Right Kidney:

Renal measurements: 10.7 x 4.9 x 3.7 cm = volume: 99.8 mL .
Echogenicity within normal limits. No mass or hydronephrosis
visualized.

Left Kidney:

Renal measurements: 11.8 x 5.5 x 3.5 cm = volume: 119.0 mL.
Echogenicity within normal limits. No mass or hydronephrosis
visualized.

Bladder:

Appears normal for degree of bladder distention.
IMPRESSION: No cause for renal failure identified.  Normal study.

## 2021-01-06 ENCOUNTER — Other Ambulatory Visit: Payer: Self-pay | Admitting: *Deleted

## 2021-01-06 DIAGNOSIS — D509 Iron deficiency anemia, unspecified: Secondary | ICD-10-CM

## 2021-01-06 DIAGNOSIS — D649 Anemia, unspecified: Secondary | ICD-10-CM

## 2021-01-12 ENCOUNTER — Other Ambulatory Visit: Payer: Self-pay

## 2021-01-12 ENCOUNTER — Inpatient Hospital Stay: Payer: Medicare HMO

## 2021-01-12 ENCOUNTER — Inpatient Hospital Stay: Payer: Medicare HMO | Attending: Oncology

## 2021-01-12 VITALS — BP 156/82 | HR 81

## 2021-01-12 DIAGNOSIS — D631 Anemia in chronic kidney disease: Secondary | ICD-10-CM | POA: Diagnosis not present

## 2021-01-12 DIAGNOSIS — N184 Chronic kidney disease, stage 4 (severe): Secondary | ICD-10-CM | POA: Diagnosis not present

## 2021-01-12 LAB — HEMOGLOBIN AND HEMATOCRIT, BLOOD
HCT: 24.3 % — ABNORMAL LOW (ref 39.0–52.0)
Hemoglobin: 7.3 g/dL — ABNORMAL LOW (ref 13.0–17.0)

## 2021-01-12 MED ORDER — EPOETIN ALFA-EPBX 10000 UNIT/ML IJ SOLN
20000.0000 [IU] | Freq: Once | INTRAMUSCULAR | Status: AC
  Administered 2021-01-12: 20000 [IU] via SUBCUTANEOUS
  Filled 2021-01-12: qty 2

## 2021-01-12 MED ORDER — EPOETIN ALFA-EPBX 40000 UNIT/ML IJ SOLN: 60000.0000 [IU] | Freq: Once | INTRAMUSCULAR | Status: DC

## 2021-01-12 MED ORDER — EPOETIN ALFA-EPBX 40000 UNIT/ML IJ SOLN
40000.0000 [IU] | Freq: Once | INTRAMUSCULAR | Status: AC
  Administered 2021-01-12: 40000 [IU] via SUBCUTANEOUS
  Filled 2021-01-12: qty 1

## 2021-01-21 ENCOUNTER — Other Ambulatory Visit: Payer: Medicare HMO

## 2021-01-21 DIAGNOSIS — Z515 Encounter for palliative care: Secondary | ICD-10-CM

## 2021-01-21 NOTE — Progress Notes (Signed)
PATIENT NAME: Arthur [name] DOB: Apr 10, 1933 MRN: [id number]  PRIMARY CARE PROVIDER: Baxter Hire, MD  RESPONSIBLE PARTY:  Acct ID - Guarantor Home Phone Work Phone Relationship Acct Type  [id number] MERIL, [phone]  Self P/F     [address], [place], [place] [address]    Due to the COVID-19 crisis, this visit was done via telemedicine from my office and it was initiated and consent by this patient and or family.  I connected with  [name] OR [facility]OX[facility] on 01/21/21 by telephone and verified that I am speaking with the correct person using two identifiers.   I discussed the limitations of evaluation and management by telemedicine. The patient expressed understanding and agreed to proceed.   Spoke with daughter [name] to follow up on patient's overall status.  Per daughter, patient is doing very well.  Appetite has been good, weight is stable, and no issues with pain.  Offered a home visit with PC NP but daughter feels phone call check-ins are sufficient at this time.  Palliative Care will continue with phone contact and provide home visits as needed.  Update provided to [name], NP.     Lorenza Burton, RN

## 2021-01-24 ENCOUNTER — Other Ambulatory Visit: Payer: Self-pay

## 2021-01-26 ENCOUNTER — Other Ambulatory Visit: Payer: Self-pay

## 2021-01-26 ENCOUNTER — Inpatient Hospital Stay: Payer: Medicare HMO

## 2021-01-26 ENCOUNTER — Ambulatory Visit: Payer: Medicare HMO

## 2021-01-26 VITALS — BP 147/52 | HR 66 | Temp 97.0°F | Resp 18

## 2021-01-26 DIAGNOSIS — E1142 Type 2 diabetes mellitus with diabetic polyneuropathy: Secondary | ICD-10-CM | POA: Diagnosis not present

## 2021-01-26 DIAGNOSIS — B351 Tinea unguium: Secondary | ICD-10-CM | POA: Diagnosis not present

## 2021-01-26 DIAGNOSIS — D631 Anemia in chronic kidney disease: Secondary | ICD-10-CM | POA: Diagnosis not present

## 2021-01-26 DIAGNOSIS — N184 Chronic kidney disease, stage 4 (severe): Secondary | ICD-10-CM | POA: Diagnosis not present

## 2021-01-26 DIAGNOSIS — N189 Chronic kidney disease, unspecified: Secondary | ICD-10-CM

## 2021-01-26 MED ORDER — SODIUM CHLORIDE 0.9 % IV SOLN: 200.0000 mg | Freq: Once | INTRAVENOUS | Status: DC

## 2021-01-26 MED ORDER — SODIUM CHLORIDE 0.9 % IV SOLN
Freq: Once | INTRAVENOUS | Status: AC
  Filled 2021-01-26: qty 250

## 2021-01-26 MED ORDER — IRON SUCROSE 20 MG/ML IV SOLN
200.0000 mg | Freq: Once | INTRAVENOUS | Status: AC
  Administered 2021-01-26: 14:00:00 200 mg via INTRAVENOUS
  Filled 2021-01-26: qty 10

## 2021-01-26 NOTE — Patient Instructions (Signed)

## 2021-01-27 DIAGNOSIS — J441 Chronic obstructive pulmonary disease with (acute) exacerbation: Secondary | ICD-10-CM | POA: Diagnosis not present

## 2021-01-27 IMAGING — DX PORTABLE CHEST - 1 VIEW
1 series · 1 of 1 positions shown · non-contrast
Comparison: 07/06/2018; chest CT-07/06/2018

CLINICAL DATA: Post right-sided thoracentesis

EXAM:
PORTABLE CHEST 1 VIEW

[chest ap]
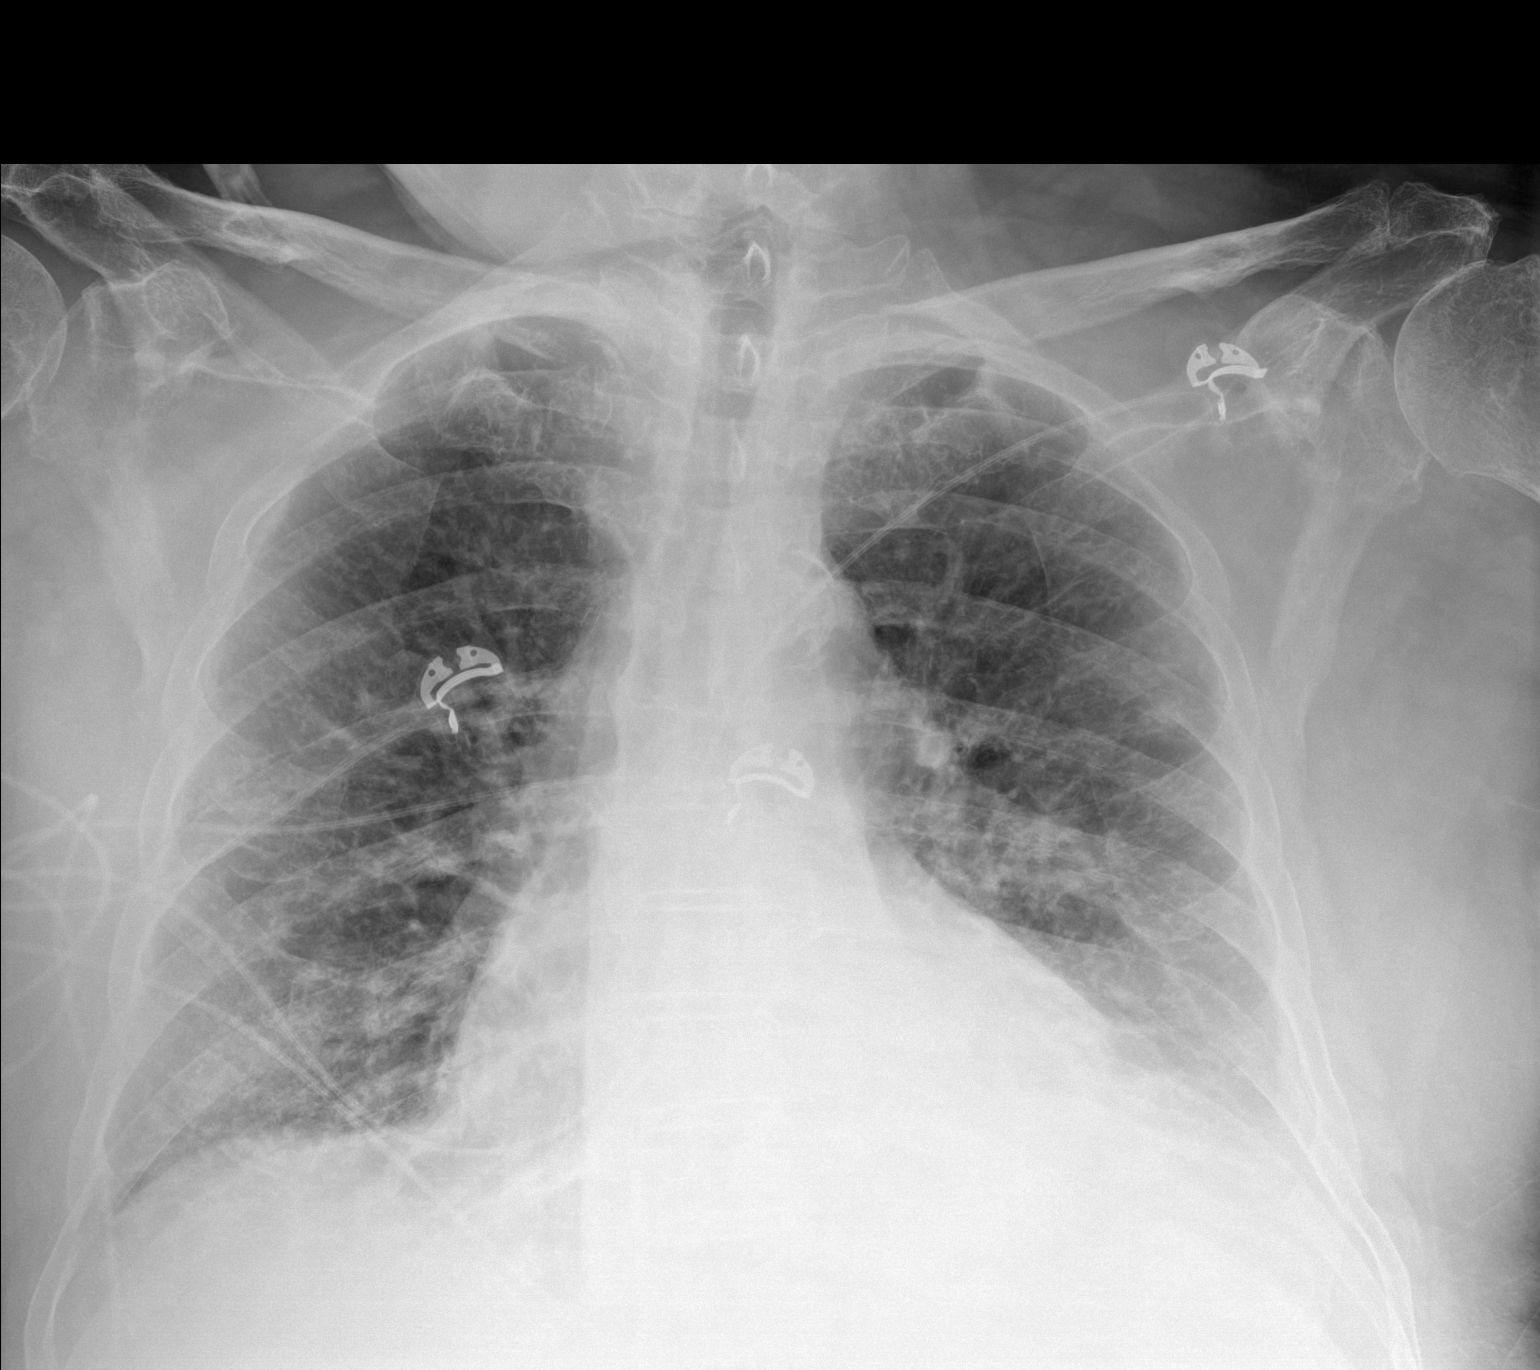

[1 of 1 positions shown; findings below may reference images not displayed]

FINDINGS: Grossly unchanged cardiac silhouette and mediastinal contours with
atherosclerotic plaque within the thoracic aorta. Interval reduction
in persistent trace right-sided effusion post thoracentesis. No
pneumothorax.

Unchanged small left-sided pleural effusion with associated left
basilar opacities.

Improved aeration of the right lung base. The pulmonary vasculature
remains indistinct with cephalization of flow.

No acute osseous abnormalities.
IMPRESSION: 1. Interval reduction in persistent trace right-sided effusion post
thoracentesis. No pneumothorax.
2. Similar findings of cardiomegaly, pulmonary edema, small
left-sided effusion and bibasilar opacities, likely atelectasis.

## 2021-02-02 ENCOUNTER — Inpatient Hospital Stay: Payer: Medicare HMO | Attending: Oncology

## 2021-02-02 ENCOUNTER — Other Ambulatory Visit: Payer: Self-pay

## 2021-02-02 ENCOUNTER — Inpatient Hospital Stay: Payer: Medicare HMO

## 2021-02-02 VITALS — BP 158/56 | HR 78

## 2021-02-02 DIAGNOSIS — N184 Chronic kidney disease, stage 4 (severe): Secondary | ICD-10-CM | POA: Diagnosis not present

## 2021-02-02 DIAGNOSIS — N189 Chronic kidney disease, unspecified: Secondary | ICD-10-CM

## 2021-02-02 DIAGNOSIS — I129 Hypertensive chronic kidney disease with stage 1 through stage 4 chronic kidney disease, or unspecified chronic kidney disease: Secondary | ICD-10-CM | POA: Insufficient documentation

## 2021-02-02 DIAGNOSIS — Z79899 Other long term (current) drug therapy: Secondary | ICD-10-CM | POA: Diagnosis not present

## 2021-02-02 DIAGNOSIS — D631 Anemia in chronic kidney disease: Secondary | ICD-10-CM | POA: Insufficient documentation

## 2021-02-02 LAB — HEMOGLOBIN AND HEMATOCRIT, BLOOD
HCT: 24.4 % — ABNORMAL LOW (ref 39.0–52.0)
Hemoglobin: 7.3 g/dL — ABNORMAL LOW (ref 13.0–17.0)

## 2021-02-02 MED ORDER — EPOETIN ALFA-EPBX 40000 UNIT/ML IJ SOLN: 60000.0000 [IU] | Freq: Once | INTRAMUSCULAR | Status: DC

## 2021-02-02 MED ORDER — EPOETIN ALFA-EPBX 10000 UNIT/ML IJ SOLN
20000.0000 [IU] | Freq: Once | INTRAMUSCULAR | Status: AC
  Administered 2021-02-02: 20000 [IU] via SUBCUTANEOUS
  Filled 2021-02-02: qty 2

## 2021-02-02 MED ORDER — EPOETIN ALFA-EPBX 40000 UNIT/ML IJ SOLN
40000.0000 [IU] | Freq: Once | INTRAMUSCULAR | Status: AC
  Administered 2021-02-02: 40000 [IU] via SUBCUTANEOUS
  Filled 2021-02-02: qty 1

## 2021-02-23 ENCOUNTER — Other Ambulatory Visit: Payer: Self-pay

## 2021-02-23 ENCOUNTER — Inpatient Hospital Stay: Payer: Medicare HMO

## 2021-02-23 VITALS — BP 176/62 | HR 72

## 2021-02-23 DIAGNOSIS — Z79899 Other long term (current) drug therapy: Secondary | ICD-10-CM | POA: Diagnosis not present

## 2021-02-23 DIAGNOSIS — D631 Anemia in chronic kidney disease: Secondary | ICD-10-CM

## 2021-02-23 DIAGNOSIS — I129 Hypertensive chronic kidney disease with stage 1 through stage 4 chronic kidney disease, or unspecified chronic kidney disease: Secondary | ICD-10-CM | POA: Diagnosis not present

## 2021-02-23 DIAGNOSIS — N184 Chronic kidney disease, stage 4 (severe): Secondary | ICD-10-CM | POA: Diagnosis not present

## 2021-02-23 LAB — HEMOGLOBIN AND HEMATOCRIT, BLOOD
HCT: 25.4 % — ABNORMAL LOW (ref 39.0–52.0)
Hemoglobin: 7.5 g/dL — ABNORMAL LOW (ref 13.0–17.0)

## 2021-02-23 MED ORDER — EPOETIN ALFA-EPBX 10000 UNIT/ML IJ SOLN
20000.0000 [IU] | Freq: Once | INTRAMUSCULAR | Status: AC
  Administered 2021-02-23: 20000 [IU] via SUBCUTANEOUS

## 2021-02-23 MED ORDER — EPOETIN ALFA-EPBX 40000 UNIT/ML IJ SOLN
40000.0000 [IU] | Freq: Once | INTRAMUSCULAR | Status: AC
  Administered 2021-02-23: 40000 [IU] via SUBCUTANEOUS
  Filled 2021-02-23: qty 1

## 2021-02-23 MED ORDER — EPOETIN ALFA-EPBX 40000 UNIT/ML IJ SOLN: 60000.0000 [IU] | Freq: Once | INTRAMUSCULAR | Status: DC

## 2021-02-27 DIAGNOSIS — J441 Chronic obstructive pulmonary disease with (acute) exacerbation: Secondary | ICD-10-CM | POA: Diagnosis not present

## 2021-03-16 ENCOUNTER — Inpatient Hospital Stay: Payer: Medicare HMO

## 2021-03-16 ENCOUNTER — Other Ambulatory Visit: Payer: Self-pay

## 2021-03-16 ENCOUNTER — Inpatient Hospital Stay: Payer: Medicare HMO | Attending: Oncology

## 2021-03-16 VITALS — BP 145/49

## 2021-03-16 DIAGNOSIS — N184 Chronic kidney disease, stage 4 (severe): Secondary | ICD-10-CM | POA: Diagnosis not present

## 2021-03-16 DIAGNOSIS — I129 Hypertensive chronic kidney disease with stage 1 through stage 4 chronic kidney disease, or unspecified chronic kidney disease: Secondary | ICD-10-CM | POA: Insufficient documentation

## 2021-03-16 DIAGNOSIS — Z79899 Other long term (current) drug therapy: Secondary | ICD-10-CM | POA: Insufficient documentation

## 2021-03-16 DIAGNOSIS — D631 Anemia in chronic kidney disease: Secondary | ICD-10-CM

## 2021-03-16 LAB — HEMOGLOBIN AND HEMATOCRIT, BLOOD
HCT: 25.1 % — ABNORMAL LOW (ref 39.0–52.0)
Hemoglobin: 7.4 g/dL — ABNORMAL LOW (ref 13.0–17.0)

## 2021-03-16 MED ORDER — EPOETIN ALFA-EPBX 10000 UNIT/ML IJ SOLN
20000.0000 [IU] | Freq: Once | INTRAMUSCULAR | Status: AC
  Administered 2021-03-16: 20000 [IU] via SUBCUTANEOUS
  Filled 2021-03-16: qty 2

## 2021-03-16 MED ORDER — EPOETIN ALFA-EPBX 40000 UNIT/ML IJ SOLN
40000.0000 [IU] | Freq: Once | INTRAMUSCULAR | Status: AC
  Administered 2021-03-16: 40000 [IU] via SUBCUTANEOUS
  Filled 2021-03-16: qty 1

## 2021-03-16 MED ORDER — EPOETIN ALFA-EPBX 40000 UNIT/ML IJ SOLN: 60000.0000 [IU] | Freq: Once | INTRAMUSCULAR | Status: DC

## 2021-03-27 DIAGNOSIS — J441 Chronic obstructive pulmonary disease with (acute) exacerbation: Secondary | ICD-10-CM | POA: Diagnosis not present

## 2021-03-30 ENCOUNTER — Encounter: Payer: Self-pay | Admitting: Dermatology

## 2021-04-04 ENCOUNTER — Encounter: Payer: Self-pay | Admitting: Oncology

## 2021-04-04 ENCOUNTER — Inpatient Hospital Stay: Payer: Medicare HMO | Attending: Oncology

## 2021-04-04 ENCOUNTER — Inpatient Hospital Stay: Payer: Medicare HMO

## 2021-04-04 ENCOUNTER — Other Ambulatory Visit: Payer: Self-pay

## 2021-04-04 ENCOUNTER — Inpatient Hospital Stay (HOSPITAL_BASED_OUTPATIENT_CLINIC_OR_DEPARTMENT_OTHER): Payer: Medicare HMO | Admitting: Oncology

## 2021-04-04 VITALS — BP 154/48 | HR 65 | Temp 98.7°F | Resp 19 | Wt 180.1 lb

## 2021-04-04 DIAGNOSIS — D631 Anemia in chronic kidney disease: Secondary | ICD-10-CM

## 2021-04-04 DIAGNOSIS — E785 Hyperlipidemia, unspecified: Secondary | ICD-10-CM | POA: Diagnosis not present

## 2021-04-04 DIAGNOSIS — I509 Heart failure, unspecified: Secondary | ICD-10-CM | POA: Insufficient documentation

## 2021-04-04 DIAGNOSIS — J449 Chronic obstructive pulmonary disease, unspecified: Secondary | ICD-10-CM | POA: Insufficient documentation

## 2021-04-04 DIAGNOSIS — D649 Anemia, unspecified: Secondary | ICD-10-CM

## 2021-04-04 DIAGNOSIS — Z79899 Other long term (current) drug therapy: Secondary | ICD-10-CM | POA: Diagnosis not present

## 2021-04-04 DIAGNOSIS — I129 Hypertensive chronic kidney disease with stage 1 through stage 4 chronic kidney disease, or unspecified chronic kidney disease: Secondary | ICD-10-CM | POA: Insufficient documentation

## 2021-04-04 DIAGNOSIS — Z87891 Personal history of nicotine dependence: Secondary | ICD-10-CM | POA: Insufficient documentation

## 2021-04-04 DIAGNOSIS — N184 Chronic kidney disease, stage 4 (severe): Secondary | ICD-10-CM

## 2021-04-04 DIAGNOSIS — I10 Essential (primary) hypertension: Secondary | ICD-10-CM | POA: Insufficient documentation

## 2021-04-04 DIAGNOSIS — E119 Type 2 diabetes mellitus without complications: Secondary | ICD-10-CM | POA: Insufficient documentation

## 2021-04-04 DIAGNOSIS — D509 Iron deficiency anemia, unspecified: Secondary | ICD-10-CM

## 2021-04-04 DIAGNOSIS — R5383 Other fatigue: Secondary | ICD-10-CM | POA: Insufficient documentation

## 2021-04-04 LAB — FERRITIN: Ferritin: 1216 ng/mL — ABNORMAL HIGH (ref 24–336)

## 2021-04-04 LAB — IRON AND TIBC
Iron: 79 ug/dL (ref 45–182)
Saturation Ratios: 51 % — ABNORMAL HIGH (ref 17.9–39.5)
TIBC: 155 ug/dL — ABNORMAL LOW (ref 250–450)
UIBC: 76 ug/dL

## 2021-04-04 LAB — CBC WITH DIFFERENTIAL/PLATELET
Abs Immature Granulocytes: 0.02 10*3/uL (ref 0.00–0.07)
Basophils Absolute: 0.1 10*3/uL (ref 0.0–0.1)
Basophils Relative: 1 %
Eosinophils Absolute: 0.2 10*3/uL (ref 0.0–0.5)
Eosinophils Relative: 3 %
HCT: 24.3 % — ABNORMAL LOW (ref 39.0–52.0)
Hemoglobin: 7 g/dL — ABNORMAL LOW (ref 13.0–17.0)
Immature Granulocytes: 0 %
Lymphocytes Relative: 23 %
Lymphs Abs: 1.2 10*3/uL (ref 0.7–4.0)
MCH: 27.2 pg (ref 26.0–34.0)
MCHC: 28.8 g/dL — ABNORMAL LOW (ref 30.0–36.0)
MCV: 94.6 fL (ref 80.0–100.0)
Monocytes Absolute: 0.6 10*3/uL (ref 0.1–1.0)
Monocytes Relative: 12 %
Neutro Abs: 3.3 10*3/uL (ref 1.7–7.7)
Neutrophils Relative %: 61 %
Platelets: 200 10*3/uL (ref 150–400)
RBC: 2.57 MIL/uL — ABNORMAL LOW (ref 4.22–5.81)
RDW: 20.2 % — ABNORMAL HIGH (ref 11.5–15.5)
WBC: 5.3 10*3/uL (ref 4.0–10.5)
nRBC: 0 % (ref 0.0–0.2)

## 2021-04-04 MED ORDER — EPOETIN ALFA-EPBX 40000 UNIT/ML IJ SOLN: 60000.0000 [IU] | Freq: Once | INTRAMUSCULAR | Status: DC

## 2021-04-04 MED ORDER — EPOETIN ALFA-EPBX 10000 UNIT/ML IJ SOLN
20000.0000 [IU] | Freq: Once | INTRAMUSCULAR | Status: AC
  Administered 2021-04-04: 20000 [IU] via SUBCUTANEOUS

## 2021-04-04 MED ORDER — EPOETIN ALFA-EPBX 40000 UNIT/ML IJ SOLN
40000.0000 [IU] | Freq: Once | INTRAMUSCULAR | Status: AC
  Administered 2021-04-04: 40000 [IU] via SUBCUTANEOUS

## 2021-04-04 NOTE — Progress Notes (Signed)
?Hematology/Oncology progress note ?[phone] [phone] ? ? ?Patient Care Team: ?Baxter Hire, MD as PCP - General (Internal Medicine) ?Earlie Server, MD as Consulting Physician (Oncology) ?Earlie Server, MD as Consulting Physician (Hematology and Oncology) ? ?REFERRING PROVIDER: ?Baxter Hire, MD  ?CHIEF COMPLAINTS/REASON FOR VISIT:  ?Follow-up for anemia secondary to chronic kidney disease.  Possible MDS ? ? ?INTERVAL HISTORY ?Arthur [name] is a 88 y.o. male who has above history reviewed by me today presents for follow up visit for management of anemia secondary to chronic kidney disease, possible underlying MDS ? ?Accompanied by his daughter.  ?Chronic fatigue, at baseline. Back pain is stable.  Patient has been on erythropoietin therapy every 3 weeks. ? ?Review of Systems  ?Constitutional:  Positive for fatigue. Negative for appetite change, chills, fever and unexpected weight change.  ?HENT:   Negative for hearing loss and voice change.   ?Eyes:  Negative for eye problems and icterus.  ?Respiratory:  Negative for chest tightness, cough and shortness of breath.   ?Cardiovascular:  Negative for chest pain and leg swelling.  ?Gastrointestinal:  Negative for abdominal distention and abdominal pain.  ?Endocrine: Negative for hot flashes.  ?Genitourinary:  Negative for difficulty urinating, dysuria and frequency.   ?Musculoskeletal:  Negative for arthralgias.  ?     Chronic back pain  ?Skin:  Negative for itching and rash.  ?Neurological:  Negative for light-headedness and numbness.  ?Hematological:  Negative for adenopathy. Does not bruise/bleed easily.  ?Psychiatric/Behavioral:  Negative for confusion.   ? ?MEDICAL HISTORY:  ?Past Medical History:  ?Diagnosis Date  ? Anemia of chronic kidney failure 08/21/2018  ? Arthritis   ? lower back  ? CHF (congestive heart failure) ([place])   ? CKD (chronic kidney disease), stage III ([place])   ? COPD (chronic obstructive pulmonary disease) ([place])   ?  Diabetes mellitus without complication ([place])   ? type 2  ? Dyspnea   ? when active -wears oxygen 2L via [place]  ? History of blood transfusion   ? Hyperlipidemia   ? Hypertension   ? Requires continuous at home supplemental oxygen   ? 2L via [place]  ? Squamous cell carcinoma in situ 09/17/2012  ? Right upper nasal dorsum. SCCis arising in [place]  ? Squamous cell carcinoma of skin 02/03/2020  ? R lat cheek, EDC  ? ? ?SURGICAL HISTORY: ?Past Surgical History:  ?Procedure Laterality Date  ? COLONOSCOPY WITH PROPOFOL N/A 06/11/2018  ? Procedure: COLONOSCOPY WITH PROPOFOL;  Surgeon: Jonathon Bellows, MD;  Location: [facility] ENDOSCOPY;  Service: Gastroenterology;  Laterality: N/A;  ? COLONOSCOPY WITH PROPOFOL N/A 06/13/2018  ? Procedure: COLONOSCOPY WITH PROPOFOL;  Surgeon: Jonathon Bellows, MD;  Location: [facility] ENDOSCOPY;  Service: Gastroenterology;  Laterality: N/A;  ? ESOPHAGOGASTRODUODENOSCOPY Left 06/10/2018  ? Procedure: ESOPHAGOGASTRODUODENOSCOPY (EGD);  Surgeon: Jonathon Bellows, MD;  Location: [facility] ENDOSCOPY;  Service: Gastroenterology;  Laterality: Left;  ? EYE SURGERY    ? removed cataracts  ? GIVENS CAPSULE STUDY N/A 06/28/2018  ? Procedure: GIVENS CAPSULE STUDY;  Surgeon: Lucilla Lame, MD;  Location: [facility] ENDOSCOPY;  Service: Endoscopy;  Laterality: N/A;  ? GIVENS CAPSULE STUDY N/A 06/30/2018  ? Procedure: GIVENS CAPSULE STUDY;  Surgeon: Jonathon Bellows, MD;  Location: [facility] ENDOSCOPY;  Service: Gastroenterology;  Laterality: N/A;  ? KYPHOPLASTY N/A 10/31/2018  ? Procedure: KYPHOPLASTY L2;  Surgeon: [name], MD;  Location: [facility] ORS;  Service: Orthopedics;  Laterality: N/A;  ? KYPHOPLASTY N/A 12/24/2018  ? Procedure: KYPHOPLASTY L3;  Surgeon: Rudene Christians,  Legrand Como, MD;  Location: [facility] ORS;  Service: Orthopedics;  Laterality: N/A;  ? KYPHOPLASTY N/A 02/18/2019  ? Procedure: L2 KYPHOPLASTY;  Surgeon: [name], MD;  Location: [facility] ORS;  Service: Orthopedics;  Laterality: N/A;  ? RADIOLOGY WITH ANESTHESIA N/A 01/28/2019  ? Procedure: MRI LUMBER SPINE  WITHOUT CONTRAST;  Surgeon: Radiologist, Medication, MD;  Location: [place];  Service: Radiology;  Laterality: N/A;  ? ? ?SOCIAL HISTORY: ?Social History  ? ?Socioeconomic History  ? Marital status: Widowed  ?  Spouse name: Not on file  ? Number of children: Not on file  ? Years of education: Not on file  ? Highest education level: Not on file  ?Occupational History  ? Not on file  ?Tobacco Use  ? Smoking status: Former  ?  Packs/day: 2.00  ?  Years: 35.00  ?  Pack years: 70.00  ?  Types: Cigarettes  ?  Quit date: 11/04/1982  ?  Years since quitting: 38.4  ? Smokeless tobacco: Current  ?  Types: Chew  ?Vaping Use  ? Vaping Use: Never used  ?Substance and Sexual Activity  ? Alcohol use: Not Currently  ? Drug use: No  ? Sexual activity: Not Currently  ?Other Topics Concern  ? Not on file  ?Social History Narrative  ? Not on file  ? ?Social Determinants of Health  ? ?Financial Resource Strain: Not on file  ?Food Insecurity: Not on file  ?Transportation Needs: Not on file  ?Physical Activity: Not on file  ?Stress: Not on file  ?Social Connections: Not on file  ?Intimate Partner Violence: Not on file  ? ? ?FAMILY HISTORY: ?Family History  ?Problem Relation Age of Onset  ? COPD Mother   ? Cancer Father   ? ? ?ALLERGIES:  is allergic to codeine. ? ?MEDICATIONS:  ?Current Outpatient Medications  ?Medication Sig Dispense Refill  ? acetaminophen (TYLENOL) 325 MG tablet Take 2 tablets (650 mg total) by mouth every 6 (six) hours as needed for mild pain (or Fever >/= 101).    ? albuterol (VENTOLIN HFA) 108 (90 Base) MCG/ACT inhaler Inhale 2 puffs into the lungs every 6 (six) hours as needed for wheezing or shortness of breath. 1 Inhaler 2  ? amLODipine (NORVASC) 2.5 MG tablet Take 2.5 mg by mouth daily.    ? ascorbic acid (VITAMIN C) 500 MG tablet Take 500 mg by mouth at bedtime.    ? insulin aspart (NOVOLOG) 100 UNIT/ML injection 4 units prior to meals if sugars above 200 10 mL 11  ? LEVEMIR 100 UNIT/ML injection Inject 0.04 mLs  (4 Units total) into the skin at bedtime. 10 mL 0  ? pantoprazole (PROTONIX) 40 MG tablet Take 1 tablet (40 mg total) by mouth daily. (Patient taking differently: Take 40 mg by mouth 2 (two) times daily.)    ? potassium chloride (KLOR-CON) 10 MEQ tablet Take 10 mEq by mouth every evening.    ? pravastatin (PRAVACHOL) 10 MG tablet Take 10 mg by mouth at bedtime.    ? tamsulosin (FLOMAX) 0.4 MG CAPS capsule Take 0.4 mg by mouth daily.    ? tiotropium (SPIRIVA) 18 MCG inhalation capsule Place 18 mcg into inhaler and inhale daily.    ? torsemide (DEMADEX) 20 MG tablet Take 20 mg by mouth every Monday, Wednesday, and Friday.    ? triamcinolone cream (KENALOG) 0.5 %     ? TRUE METRIX BLOOD GLUCOSE TEST test strip 1 each by Other route every morning.    ? docusate  sodium (COLACE) 100 MG capsule Take 1 capsule (100 mg total) by mouth 2 (two) times daily. (Patient not taking: Reported on 09/29/2020) 60 capsule 0  ? metoCLOPramide (REGLAN) 5 MG tablet Take 5 mg by mouth 4 (four) times daily -  before meals and at bedtime. (Patient not taking: Reported on 03/31/2020)    ? polyethylene glycol powder (GLYCOLAX/MIRALAX) 17 GM/SCOOP powder Take 17 g by mouth every other day.  (Patient not taking: Reported on 03/31/2020)    ? promethazine (PHENERGAN) 25 MG tablet Take 25 mg by mouth every 4 (four) hours as needed for nausea or vomiting. (Patient not taking: Reported on 03/31/2020)    ? ?No current facility-administered medications for this visit.  ? ?Facility-Administered Medications Ordered in Other Visits  ?Medication Dose Route Frequency Provider Last Rate Last Admin  ? 0.9 %  sodium chloride infusion   Intravenous Continuous Earlie Server, MD 10 mL/hr at 10/15/20 1452 New Bag at 10/15/20 1452  ? ? ? ?PHYSICAL EXAMINATION: ?ECOG PERFORMANCE STATUS: 3 - Symptomatic, >50% confined to bed ?Vitals:  ? 04/04/21 1424  ?BP: (!) 154/48  ?Pulse: 65  ?Resp: 19  ?Temp: 98.7 ?F (37.1 ?C)  ?SpO2: 99%  ? ?Filed Weights  ? 04/04/21 1424  ?Weight: 180  lb 1.6 oz (81.7 kg)  ? ? ?Physical Exam ?Constitutional:   ?   General: He is not in acute distress. ?   Appearance: He is ill-appearing.  ?   Comments: Sits in wheel chair  ?HENT:  ?   Head: Normocephalic and atra

## 2021-04-05 ENCOUNTER — Telehealth: Payer: Self-pay

## 2021-04-05 ENCOUNTER — Other Ambulatory Visit: Payer: Self-pay | Admitting: Hospice and Palliative Medicine

## 2021-04-05 ENCOUNTER — Other Ambulatory Visit: Payer: Medicare HMO

## 2021-04-05 ENCOUNTER — Other Ambulatory Visit: Payer: Self-pay | Admitting: Oncology

## 2021-04-05 DIAGNOSIS — Z515 Encounter for palliative care: Secondary | ICD-10-CM

## 2021-04-05 DIAGNOSIS — D649 Anemia, unspecified: Secondary | ICD-10-CM

## 2021-04-05 DIAGNOSIS — D631 Anemia in chronic kidney disease: Secondary | ICD-10-CM

## 2021-04-05 LAB — SAMPLE TO BLOOD BANK

## 2021-04-05 LAB — PREPARE RBC (CROSSMATCH)

## 2021-04-05 NOTE — Telephone Encounter (Signed)
357 pm.  Phone call made to Lisa-daughter to schedule a Palliative Care visit. Message left on VM requesting a call back.  ?

## 2021-04-06 ENCOUNTER — Ambulatory Visit: Payer: Medicare HMO

## 2021-04-06 ENCOUNTER — Ambulatory Visit: Payer: Medicare HMO | Admitting: Oncology

## 2021-04-06 NOTE — Progress Notes (Signed)
PATIENT NAME: Arthur [name] ?DOB: 1933-05-05 ?MRN: [id number] ? ?PRIMARY CARE PROVIDER: Baxter Hire, MD ? ?RESPONSIBLE PARTY:  ?Acct ID - Guarantor Home Phone Work Phone Relationship Acct Type  ?[id number] Arthur [name]* [phone]  Self P/F  ?   [place], [place], [place] [address]  ? ? ?I connected with  Arthur [name] on 04/06/21 by telephone and verified that I am speaking with the correct person using two identifiers. ?  ?I discussed the limitations of evaluation and management by telemedicine. The patient expressed understanding and agreed to proceed.  ? ?Received call from daughter Arthur [name].  She voiced her confusion over another Palliative Care referral being sent when patient is already established.  She is asking if there is a difference between [employer] and Palliative Care.  Advised Arthur [name] is the name of the company and Palliative Care and Hospice fall under this.   Explained Palliative Care services.  Daughter voiced understanding.  ? ?Update provided on patient status from daughter.  She does not feel there has been any changes since we spoke in January.  Advised patient has a private caregiver that comes in to ensure he is eating meals.  Daughter continues to weigh patient daily with weights being between 177-180 lbs.   Patient is eating well.  No falls are reported.  Patient is using a walking inside/outside of the home.  Patient is continent of bowel and bladder.  Using a urinal by his chair and will ambulate to the bathroom as needed.  Arthur [name] advised patient does have some issues with insomnia but he naps for long periods of time during the day and she attributes this to insomnia.  She is trying to help patient with sleep hygiene.  Patient has some occasional issues with back pain but pain is managed well at this time.   Per Arthur [name], patient's biggest issue is fatigue related to his anemia. ? ?I have offered a home visit with Arthur Gusler, NP but daughter did not feel this was  necessary.  She will contact us if a home visit is needed but is agreeable to continue with phone check-ins. ? ?Update provided to Arthur Leatherwood, NP.  ? ?HISTORY OF PRESENT ILLNESS:  Arthur [name] is a 88 y.o. year old male  with COPD, congestive heart failure, anemia of chronic disease, chronic kidney disease stage 3, diabetes, hypertension, hyperlipidemia, history of GI bleed, allergies, tobacco use, obesity.  Patient is followed by Palliative Care every 4-8 weeks and PRN.  ? ?CODE STATUS: DNR ?ADVANCED DIRECTIVES: No ?MOST FORM: No ?PPS: 50% ? ? ? ? ? ? ? ? ?Arthur Burton, RN ? ?

## 2021-04-07 ENCOUNTER — Inpatient Hospital Stay: Payer: Medicare HMO

## 2021-04-07 DIAGNOSIS — J449 Chronic obstructive pulmonary disease, unspecified: Secondary | ICD-10-CM | POA: Diagnosis not present

## 2021-04-07 DIAGNOSIS — R5383 Other fatigue: Secondary | ICD-10-CM | POA: Diagnosis not present

## 2021-04-07 DIAGNOSIS — E119 Type 2 diabetes mellitus without complications: Secondary | ICD-10-CM | POA: Diagnosis not present

## 2021-04-07 DIAGNOSIS — D649 Anemia, unspecified: Secondary | ICD-10-CM

## 2021-04-07 DIAGNOSIS — Z79899 Other long term (current) drug therapy: Secondary | ICD-10-CM | POA: Diagnosis not present

## 2021-04-07 DIAGNOSIS — D631 Anemia in chronic kidney disease: Secondary | ICD-10-CM | POA: Diagnosis not present

## 2021-04-07 DIAGNOSIS — I509 Heart failure, unspecified: Secondary | ICD-10-CM | POA: Diagnosis not present

## 2021-04-07 DIAGNOSIS — N184 Chronic kidney disease, stage 4 (severe): Secondary | ICD-10-CM | POA: Diagnosis not present

## 2021-04-07 DIAGNOSIS — I129 Hypertensive chronic kidney disease with stage 1 through stage 4 chronic kidney disease, or unspecified chronic kidney disease: Secondary | ICD-10-CM | POA: Diagnosis not present

## 2021-04-07 DIAGNOSIS — E785 Hyperlipidemia, unspecified: Secondary | ICD-10-CM | POA: Diagnosis not present

## 2021-04-07 MED ORDER — SODIUM CHLORIDE 0.9% IV SOLUTION
250.0000 mL | Freq: Once | INTRAVENOUS | Status: AC
  Administered 2021-04-07: 250 mL via INTRAVENOUS
  Filled 2021-04-07: qty 250

## 2021-04-07 MED ORDER — ACETAMINOPHEN 325 MG PO TABS
650.0000 mg | ORAL_TABLET | Freq: Once | ORAL | Status: AC
  Administered 2021-04-07: 650 mg via ORAL
  Filled 2021-04-07: qty 2

## 2021-04-07 MED ORDER — DIPHENHYDRAMINE HCL 25 MG PO CAPS
25.0000 mg | ORAL_CAPSULE | Freq: Once | ORAL | Status: AC
  Administered 2021-04-07: 25 mg via ORAL
  Filled 2021-04-07: qty 1

## 2021-04-07 NOTE — Patient Instructions (Signed)
[facility]  Discharge Instructions: °Thank you for choosing [facility] to provide your oncology and hematology care.  ° °If you have a lab appointment with the Cancer Center, please go directly to the Cancer Center and check in at the registration area. °  °Wear comfortable clothing and clothing appropriate for easy access to any Portacath or PICC line.  ° °We strive to give you quality time with your provider. You may need to reschedule your appointment if you arrive late (15 or more minutes).  Arriving late affects you and other patients whose appointments are after yours.  Also, if you miss three or more appointments without notifying the office, you may be dismissed from the clinic at the provider’s discretion.    °  °For prescription refill requests, have your pharmacy contact our office and allow 72 hours for refills to be completed.   ° °Today you received the following chemotherapy and/or immunotherapy agents     °  °To help prevent nausea and vomiting after your treatment, we encourage you to take your nausea medication as directed. ° °BELOW ARE SYMPTOMS THAT SHOULD BE REPORTED IMMEDIATELY: °*FEVER GREATER THAN 100.4 F (38 °C) OR HIGHER °*CHILLS OR SWEATING °*NAUSEA AND VOMITING THAT IS NOT CONTROLLED WITH YOUR NAUSEA MEDICATION °*UNUSUAL SHORTNESS OF BREATH °*UNUSUAL BRUISING OR BLEEDING °*URINARY PROBLEMS (pain or burning when urinating, or frequent urination) °*BOWEL PROBLEMS (unusual diarrhea, constipation, pain near the anus) °TENDERNESS IN MOUTH AND THROAT WITH OR WITHOUT PRESENCE OF ULCERS (sore throat, sores in mouth, or a toothache) °UNUSUAL RASH, SWELLING OR PAIN  °UNUSUAL VAGINAL DISCHARGE OR ITCHING  ° °Items with * indicate a potential emergency and should be followed up as soon as possible or go to the Emergency Department if any problems should occur. ° °Please show the CHEMOTHERAPY ALERT CARD or IMMUNOTHERAPY ALERT CARD at check-in to the  Emergency Department and triage nurse. ° °Should you have questions after your visit or need to cancel or reschedule your appointment, please contact [facility]  Dept: [phone]  and follow the prompts.  Office hours are 8:00 a.m. to 4:30 p.m. Monday - Friday. Please note that voicemails left after 4:00 p.m. may not be returned until the following business day.  We are closed weekends and major holidays. You have access to a nurse at all times for urgent questions. Please call the main number to the clinic Dept: [phone] and follow the prompts. ° ° °For any non-urgent questions, you may also contact your provider using MyChart. We now offer e-Visits for anyone 18 and older to request care online for non-urgent symptoms. For details visit [email or web address]. °  °Also download the MyChart app! Go to the app store, search "MyChart", open the app, select [place], and log in with your MyChart username and password. ° °Due to Covid, a mask is required upon entering the hospital/clinic. If you do not have a mask, one will be given to you upon arrival. For doctor visits, patients may have 1 support person aged 18 or older with them. For treatment visits, patients cannot have anyone with them due to current Covid guidelines and our immunocompromised population.  ° °

## 2021-04-08 LAB — TYPE AND SCREEN
ABO/RH(D): A POS
Antibody Screen: NEGATIVE
Unit division: 0

## 2021-04-08 LAB — BPAM RBC
Blood Product Expiration Date: 202304252359
ISSUE DATE / TIME: 202304060949
Unit Type and Rh: 6200

## 2021-04-18 ENCOUNTER — Inpatient Hospital Stay: Payer: Medicare HMO

## 2021-04-18 VITALS — BP 152/56 | HR 65

## 2021-04-18 DIAGNOSIS — D631 Anemia in chronic kidney disease: Secondary | ICD-10-CM | POA: Diagnosis not present

## 2021-04-18 DIAGNOSIS — E119 Type 2 diabetes mellitus without complications: Secondary | ICD-10-CM | POA: Diagnosis not present

## 2021-04-18 DIAGNOSIS — R5383 Other fatigue: Secondary | ICD-10-CM | POA: Diagnosis not present

## 2021-04-18 DIAGNOSIS — J449 Chronic obstructive pulmonary disease, unspecified: Secondary | ICD-10-CM | POA: Diagnosis not present

## 2021-04-18 DIAGNOSIS — Z79899 Other long term (current) drug therapy: Secondary | ICD-10-CM | POA: Diagnosis not present

## 2021-04-18 DIAGNOSIS — E785 Hyperlipidemia, unspecified: Secondary | ICD-10-CM | POA: Diagnosis not present

## 2021-04-18 DIAGNOSIS — I129 Hypertensive chronic kidney disease with stage 1 through stage 4 chronic kidney disease, or unspecified chronic kidney disease: Secondary | ICD-10-CM | POA: Diagnosis not present

## 2021-04-18 DIAGNOSIS — I509 Heart failure, unspecified: Secondary | ICD-10-CM | POA: Diagnosis not present

## 2021-04-18 DIAGNOSIS — N184 Chronic kidney disease, stage 4 (severe): Secondary | ICD-10-CM | POA: Diagnosis not present

## 2021-04-18 LAB — HEMOGLOBIN AND HEMATOCRIT, BLOOD
HCT: 27.8 % — ABNORMAL LOW (ref 39.0–52.0)
Hemoglobin: 8.1 g/dL — ABNORMAL LOW (ref 13.0–17.0)

## 2021-04-18 MED ORDER — EPOETIN ALFA-EPBX 10000 UNIT/ML IJ SOLN
20000.0000 [IU] | Freq: Once | INTRAMUSCULAR | Status: AC
  Administered 2021-04-18: 20000 [IU] via SUBCUTANEOUS

## 2021-04-18 MED ORDER — EPOETIN ALFA-EPBX 40000 UNIT/ML IJ SOLN
60000.0000 [IU] | Freq: Once | INTRAMUSCULAR | Status: DC
  Filled 2021-04-18: qty 2

## 2021-04-18 MED ORDER — EPOETIN ALFA-EPBX 40000 UNIT/ML IJ SOLN
40000.0000 [IU] | Freq: Once | INTRAMUSCULAR | Status: AC
  Administered 2021-04-18: 40000 [IU] via SUBCUTANEOUS

## 2021-04-25 DIAGNOSIS — Z794 Long term (current) use of insulin: Secondary | ICD-10-CM | POA: Diagnosis not present

## 2021-04-25 DIAGNOSIS — E119 Type 2 diabetes mellitus without complications: Secondary | ICD-10-CM | POA: Diagnosis not present

## 2021-04-27 DIAGNOSIS — J449 Chronic obstructive pulmonary disease, unspecified: Secondary | ICD-10-CM | POA: Diagnosis not present

## 2021-04-27 DIAGNOSIS — J441 Chronic obstructive pulmonary disease with (acute) exacerbation: Secondary | ICD-10-CM | POA: Diagnosis not present

## 2021-05-02 DIAGNOSIS — E1142 Type 2 diabetes mellitus with diabetic polyneuropathy: Secondary | ICD-10-CM | POA: Diagnosis not present

## 2021-05-02 DIAGNOSIS — D631 Anemia in chronic kidney disease: Secondary | ICD-10-CM | POA: Diagnosis not present

## 2021-05-02 DIAGNOSIS — J431 Panlobular emphysema: Secondary | ICD-10-CM | POA: Diagnosis not present

## 2021-05-02 DIAGNOSIS — B351 Tinea unguium: Secondary | ICD-10-CM | POA: Diagnosis not present

## 2021-05-02 DIAGNOSIS — J9611 Chronic respiratory failure with hypoxia: Secondary | ICD-10-CM | POA: Diagnosis not present

## 2021-05-02 DIAGNOSIS — E7800 Pure hypercholesterolemia, unspecified: Secondary | ICD-10-CM | POA: Diagnosis not present

## 2021-05-02 DIAGNOSIS — Z0000 Encounter for general adult medical examination without abnormal findings: Secondary | ICD-10-CM | POA: Diagnosis not present

## 2021-05-02 DIAGNOSIS — Z0001 Encounter for general adult medical examination with abnormal findings: Secondary | ICD-10-CM | POA: Diagnosis not present

## 2021-05-02 DIAGNOSIS — N1832 Chronic kidney disease, stage 3b: Secondary | ICD-10-CM | POA: Diagnosis not present

## 2021-05-02 DIAGNOSIS — Z794 Long term (current) use of insulin: Secondary | ICD-10-CM | POA: Diagnosis not present

## 2021-05-02 DIAGNOSIS — E1122 Type 2 diabetes mellitus with diabetic chronic kidney disease: Secondary | ICD-10-CM | POA: Diagnosis not present

## 2021-05-03 ENCOUNTER — Inpatient Hospital Stay: Payer: Medicare HMO

## 2021-05-03 ENCOUNTER — Inpatient Hospital Stay: Payer: Medicare HMO | Attending: Oncology

## 2021-05-03 VITALS — BP 161/62 | HR 63

## 2021-05-03 DIAGNOSIS — Z79899 Other long term (current) drug therapy: Secondary | ICD-10-CM | POA: Diagnosis not present

## 2021-05-03 DIAGNOSIS — N184 Chronic kidney disease, stage 4 (severe): Secondary | ICD-10-CM | POA: Insufficient documentation

## 2021-05-03 DIAGNOSIS — D631 Anemia in chronic kidney disease: Secondary | ICD-10-CM | POA: Diagnosis not present

## 2021-05-03 DIAGNOSIS — I129 Hypertensive chronic kidney disease with stage 1 through stage 4 chronic kidney disease, or unspecified chronic kidney disease: Secondary | ICD-10-CM | POA: Insufficient documentation

## 2021-05-03 LAB — HEMOGLOBIN AND HEMATOCRIT, BLOOD
HCT: 27.5 % — ABNORMAL LOW (ref 39.0–52.0)
Hemoglobin: 8.1 g/dL — ABNORMAL LOW (ref 13.0–17.0)

## 2021-05-03 MED ORDER — EPOETIN ALFA-EPBX 40000 UNIT/ML IJ SOLN
40000.0000 [IU] | Freq: Once | INTRAMUSCULAR | Status: AC
  Administered 2021-05-03: 40000 [IU] via SUBCUTANEOUS
  Filled 2021-05-03: qty 1

## 2021-05-03 MED ORDER — EPOETIN ALFA-EPBX 40000 UNIT/ML IJ SOLN: 60000.0000 [IU] | Freq: Once | INTRAMUSCULAR | Status: DC

## 2021-05-03 MED ORDER — EPOETIN ALFA-EPBX 10000 UNIT/ML IJ SOLN
20000.0000 [IU] | Freq: Once | INTRAMUSCULAR | Status: AC
  Administered 2021-05-03: 20000 [IU] via SUBCUTANEOUS
  Filled 2021-05-03: qty 2

## 2021-05-13 ENCOUNTER — Other Ambulatory Visit: Payer: Self-pay

## 2021-05-13 DIAGNOSIS — J449 Chronic obstructive pulmonary disease, unspecified: Secondary | ICD-10-CM | POA: Diagnosis not present

## 2021-05-13 DIAGNOSIS — D631 Anemia in chronic kidney disease: Secondary | ICD-10-CM

## 2021-05-13 DIAGNOSIS — E785 Hyperlipidemia, unspecified: Secondary | ICD-10-CM | POA: Diagnosis not present

## 2021-05-13 DIAGNOSIS — I129 Hypertensive chronic kidney disease with stage 1 through stage 4 chronic kidney disease, or unspecified chronic kidney disease: Secondary | ICD-10-CM | POA: Diagnosis not present

## 2021-05-13 DIAGNOSIS — N183 Chronic kidney disease, stage 3 unspecified: Secondary | ICD-10-CM | POA: Diagnosis not present

## 2021-05-13 DIAGNOSIS — G4733 Obstructive sleep apnea (adult) (pediatric): Secondary | ICD-10-CM | POA: Diagnosis not present

## 2021-05-13 DIAGNOSIS — E1122 Type 2 diabetes mellitus with diabetic chronic kidney disease: Secondary | ICD-10-CM | POA: Diagnosis not present

## 2021-05-16 ENCOUNTER — Inpatient Hospital Stay: Payer: Medicare HMO

## 2021-05-16 VITALS — BP 153/77 | HR 73

## 2021-05-16 DIAGNOSIS — D631 Anemia in chronic kidney disease: Secondary | ICD-10-CM | POA: Diagnosis not present

## 2021-05-16 DIAGNOSIS — I129 Hypertensive chronic kidney disease with stage 1 through stage 4 chronic kidney disease, or unspecified chronic kidney disease: Secondary | ICD-10-CM | POA: Diagnosis not present

## 2021-05-16 DIAGNOSIS — Z79899 Other long term (current) drug therapy: Secondary | ICD-10-CM | POA: Diagnosis not present

## 2021-05-16 DIAGNOSIS — N184 Chronic kidney disease, stage 4 (severe): Secondary | ICD-10-CM | POA: Diagnosis not present

## 2021-05-16 LAB — HEMOGLOBIN AND HEMATOCRIT, BLOOD
HCT: 29 % — ABNORMAL LOW (ref 39.0–52.0)
Hemoglobin: 8.6 g/dL — ABNORMAL LOW (ref 13.0–17.0)

## 2021-05-16 MED ORDER — EPOETIN ALFA-EPBX 20000 UNIT/ML IJ SOLN
20000.0000 [IU] | Freq: Once | INTRAMUSCULAR | Status: AC
  Administered 2021-05-16: 20000 [IU] via SUBCUTANEOUS
  Filled 2021-05-16: qty 1

## 2021-05-16 MED ORDER — EPOETIN ALFA-EPBX 40000 UNIT/ML IJ SOLN
40000.0000 [IU] | Freq: Once | INTRAMUSCULAR | Status: AC
  Administered 2021-05-16: 40000 [IU] via SUBCUTANEOUS
  Filled 2021-05-16: qty 1

## 2021-05-16 MED ORDER — EPOETIN ALFA-EPBX 20000 UNIT/ML IJ SOLN: 60000.0000 [IU] | Freq: Once | INTRAMUSCULAR | Status: DC

## 2021-05-23 ENCOUNTER — Ambulatory Visit: Payer: Medicare HMO | Admitting: Dermatology

## 2021-05-23 DIAGNOSIS — Z86007 Personal history of in-situ neoplasm of skin: Secondary | ICD-10-CM | POA: Diagnosis not present

## 2021-05-23 DIAGNOSIS — L821 Other seborrheic keratosis: Secondary | ICD-10-CM | POA: Diagnosis not present

## 2021-05-23 DIAGNOSIS — Z1283 Encounter for screening for malignant neoplasm of skin: Secondary | ICD-10-CM | POA: Diagnosis not present

## 2021-05-23 DIAGNOSIS — L814 Other melanin hyperpigmentation: Secondary | ICD-10-CM

## 2021-05-23 DIAGNOSIS — L570 Actinic keratosis: Secondary | ICD-10-CM | POA: Diagnosis not present

## 2021-05-23 DIAGNOSIS — Z85828 Personal history of other malignant neoplasm of skin: Secondary | ICD-10-CM

## 2021-05-23 DIAGNOSIS — L853 Xerosis cutis: Secondary | ICD-10-CM

## 2021-05-23 DIAGNOSIS — D692 Other nonthrombocytopenic purpura: Secondary | ICD-10-CM

## 2021-05-23 DIAGNOSIS — L820 Inflamed seborrheic keratosis: Secondary | ICD-10-CM

## 2021-05-23 DIAGNOSIS — L578 Other skin changes due to chronic exposure to nonionizing radiation: Secondary | ICD-10-CM

## 2021-05-23 NOTE — Patient Instructions (Addendum)
Cryotherapy Aftercare  Wash gently with soap and water everyday.   Apply Vaseline and Band-Aid daily until healed.    Dry Skin Care  What causes dry skin?  Dry skin is common and results from inadequate moisture in the outer skin layers. Dry skin usually results from the excessive loss of moisture from the skin surface. This occurs due to two major factors: Normally the skin's oil glands deposit a layer of oil on the skin's surface. This layer of oil prevents the loss of moisture from the skin. Exposure to soaps, cleaners, solvents, and disinfectants removes this oily film, allowing water to escape. Water loss from the skin increases when the humidity is low. During winter months we spend a lot of time indoors where the air is heated. Heated air has very low humidity. This also contributes to dry skin.  A tendency for dry skin may accompany such disorders as eczema. Also, as people age, the number of functioning oil glands decreases, and the tendency toward dry skin can be a sensation of skin tightness when emerging from the shower.  How do I manage dry skin?  Humidify your environment. This can be accomplished by using a humidifier in your bedroom at night during winter months. Bathing can actually put moisture back into your skin if done right. Take the following steps while bathing to sooth dry skin: Avoid hot water, which only dries the skin and makes itching worse. Use warm water. Avoid washcloths or extensive rubbing or scrubbing. Use mild soaps like unscented Dove, Oil of Olay, Cetaphil, Basis, or CeraVe. If you take baths rather than showers, rinse off soap residue with clean water before getting out of tub. Once out of the shower/tub, pat dry gently with a soft towel. Leave your skin damp. While still damp, apply any medicated ointment/cream you were prescribed to the affected areas. After you apply your medicated ointment/cream, then apply your moisturizer to your whole body.This  is the most important step in dry skin care. If this is omitted, your skin will continue to be dry. The choice of moisturizer is also very important. In general, lotion will not provider enough moisture to severely dry skin because it is water based. You should use an ointment or cream. Moisturizers should also be unscented. Good choices include Vaseline (plain petrolatum), Aquaphor, Cetaphil, CeraVe, Vanicream, DML Forte, Aveeno moisture, or Eucerin Cream. Bath oils can be helpful, but do not replace the application of moisturizer after the bath. In addition, they make the tub slippery causing an increased risk for falls. Therefore, we do not recommend their use.     If You Need Anything After Your Visit  If you have any questions or concerns for your doctor, please call our main line at [phone] and press option 4 to reach your doctor's medical assistant. If no one answers, please leave a voicemail as directed and we will return your call as soon as possible. Messages left after 4 pm will be answered the following business day.   You may also send [place] a message via [place]. We typically respond to MyChart messages within 1-2 business days.  For prescription refills, please ask your pharmacy to contact our office. Our fax number is [phone].  If you have an urgent issue when the clinic is closed that cannot wait until the next business day, you can page your doctor at the number below.    Please note that while we do our best to be available for urgent issues outside  of office hours, we are not available 24/7.   If you have an urgent issue and are unable to reach [place], you may choose to seek medical care at your doctor's office, retail clinic, urgent care center, or emergency room.  If you have a medical emergency, please immediately call 911 or go to the emergency department.  Pager Numbers  - Dr. Nehemiah Massed: [phone]  - Dr. Laurence Ferrari: [phone]  - Dr. Nicole Kindred: [phone]  In  the event of inclement weather, please call our main line at [phone] for an update on the status of any delays or closures.  Dermatology Medication Tips: Please keep the boxes that topical medications come in in order to help keep track of the instructions about where and how to use these. Pharmacies typically print the medication instructions only on the boxes and not directly on the medication tubes.   If your medication is too expensive, please contact our office at [phone] option 4 or send [place] a message through [place].   We are unable to tell what your co-pay for medications will be in advance as this is different depending on your insurance coverage. However, we may be able to find a substitute medication at lower cost or fill out paperwork to get insurance to cover a needed medication.   If a prior authorization is required to get your medication covered by your insurance company, please allow [place] 1-2 business days to complete this process.  Drug prices often vary depending on where the prescription is filled and some pharmacies may offer cheaper prices.  The website www.goodrx.com contains coupons for medications through different pharmacies. The prices here do not account for what the cost may be with help from insurance (it may be cheaper with your insurance), but the website can give you the price if you did not use any insurance.  - You can print the associated coupon and take it with your prescription to the pharmacy.  - You may also stop by our office during regular business hours and pick up a GoodRx coupon card.  - If you need your prescription sent electronically to a different pharmacy, notify our office through [facility] or by phone at [phone] option 4.     Si Usted Necesita Algo Despus de Su Visita  Tambin puede enviarnos un mensaje a travs de Pharmacist, community. Por lo general respondemos a los mensajes de MyChart en el transcurso de 1 a 2 das  hbiles.  Para renovar recetas, por favor pida a su farmacia que se ponga en contacto con nuestra oficina. Harland Dingwall de fax es [place] [phone].  Si tiene un asunto urgente cuando la clnica est cerrada y que no puede esperar hasta el siguiente da hbil, puede llamar/localizar a su doctor(a) al nmero que aparece a continuacin.   Por favor, tenga en cuenta que aunque hacemos todo lo posible para estar disponibles para asuntos urgentes fuera del horario de [place], no estamos disponibles las 24 horas del da, los 7 das de la [place].   Si tiene un problema urgente y no puede comunicarse con nosotros, puede optar por buscar atencin mdica  en el consultorio de su doctor(a), en una clnica privada, en un centro de atencin urgente o en una sala de emergencias.  Si tiene Engineering geologist, por favor llame inmediatamente al 911 o vaya a la sala de emergencias.  Nmeros de bper  - Dr. Nehemiah Massed: [phone]  - Dra. Moye: [phone]  - Dra. Nicole Kindred: [phone]  En caso de  inclemencias del [place], por favor llame a nuestra lnea principal al [phone] para una actualizacin sobre el [place] de cualquier retraso o cierre.  Consejos para la medicacin en dermatologa: Por favor, guarde las cajas en las que vienen los medicamentos de uso tpico para ayudarle a seguir las instrucciones sobre dnde y cmo usarlos. Las farmacias generalmente imprimen las instrucciones del medicamento slo en las cajas y no directamente en los tubos del [place].   Si su medicamento es muy caro, por favor, pngase en contacto con Zigmund Daniel llamando al [phone] y presione la opcin 4 o envenos un mensaje a travs de Pharmacist, community.   No podemos decirle cul ser su copago por los medicamentos por adelantado ya que esto es diferente dependiendo de la cobertura de su seguro. Sin embargo, es posible que podamos encontrar un medicamento sustituto a Electrical engineer un formulario para que el  seguro cubra el medicamento que se considera necesario.   Si se requiere una autorizacin previa para que su compaa de seguros [place] su medicamento, por favor permtanos de 1 a 2 das hbiles para completar este proceso.  Los precios de los medicamentos varan con frecuencia dependiendo del Environmental consultant de dnde se surte la receta y alguna farmacias pueden ofrecer precios ms baratos.  El sitio web www.goodrx.com tiene cupones para medicamentos de Airline pilot. Los precios aqu no tienen en cuenta lo que podra costar con la ayuda del seguro (puede ser ms barato con su seguro), pero el sitio web puede darle el precio si no utiliz Research scientist (physical sciences).  - Puede imprimir el cupn correspondiente y llevarlo con su receta a la farmacia.  - Tambin puede pasar por nuestra oficina durante el horario de atencin regular y Charity fundraiser una tarjeta de cupones de GoodRx.  - Si necesita que su receta se enve electrnicamente a una farmacia diferente, informe a nuestra oficina a travs de MyChart de [place] o por telfono llamando al [phone] y presione la opcin 4.

## 2021-05-23 NOTE — Progress Notes (Signed)
Follow-Up Visit   Subjective  Arthur [name] is a 88 y.o. male who presents for the following: Upper body skin exam (Hx of SCC R lat cheek, hx of SCC IS R upper nasal dorsum, hx of AKs), check bump (Nose, scratches off), and itching (L post auricular, no treatment).  The patient presents for Upper Body Skin Exam (UBSE) for skin cancer screening and mole check.  The patient has spots, moles and lesions to be evaluated, some may be new or changing and the patient has concerns that these could be cancer.   The following portions of the chart were reviewed this encounter and updated as appropriate:       Review of Systems:  No other skin or systemic complaints except as noted in HPI or Assessment and Plan.  Objective  Well appearing patient in no apparent distress; mood and affect are within normal limits.  All skin waist up examined.  R ant alar crease x 1 Pink keratotic macule  L post auricular neck x 5 (5) Stuck on waxy paps with erythema    Assessment & Plan   Lentigines - Scattered tan macules - Due to sun exposure - Benign-appearing, observe - Recommend daily broad spectrum sunscreen SPF 30+ to sun-exposed areas, reapply every 2 hours as needed. - Call for any changes - back  Seborrheic Keratoses - Stuck-on, waxy, tan-brown papules and/or plaques  - Benign-appearing - Discussed benign etiology and prognosis. - Observe - Call for any changes - back  Purpura - Chronic; persistent and recurrent.  Treatable, but not curable. - Violaceous macules and patches - Benign - Related to trauma, age, sun damage and/or use of blood thinners, chronic use of topical and/or oral steroids - Observe - Can use OTC arnica containing moisturizer such as Dermend Bruise Formula if desired - Call for worsening or other concerns  Xerosis - diffuse xerotic patches - recommend gentle, hydrating skin care - gentle skin care handout given - back, hands/arms   Actinic Damage -  Chronic condition, secondary to cumulative UV/sun exposure - diffuse scaly erythematous macules with underlying dyspigmentation - Recommend daily broad spectrum sunscreen SPF 30+ to sun-exposed areas, reapply every 2 hours as needed.  - Staying in the shade or wearing long sleeves, sun glasses (UVA+UVB protection) and wide brim hats (4-inch brim around the entire circumference of the hat) are also recommended for sun protection.  - Call for new or changing lesions.  Skin cancer screening performed today.  History of Squamous Cell Carcinoma of the Skin - No evidence of recurrence today - Recommend regular full body skin exams - Recommend daily broad spectrum sunscreen SPF 30+ to sun-exposed areas, reapply every 2 hours as needed.  - Call if any new or changing lesions are noted between office visits - R lat cheek  History of Squamous Cell Carcinoma in Situ of the Skin - No evidence of recurrence today - Recommend regular full body skin exams - Recommend daily broad spectrum sunscreen SPF 30+ to sun-exposed areas, reapply every 2 hours as needed.  - Call if any new or changing lesions are noted between office visits  - R upper nasal dorsum    AK (actinic keratosis) R ant alar crease x 1  Destruction of lesion - R ant alar crease x 1  Destruction method: cryotherapy   Informed consent: discussed and consent obtained   Lesion destroyed using liquid nitrogen: Yes   Region frozen until ice ball extended beyond lesion: Yes   Outcome:  patient tolerated procedure well with no complications   Post-procedure details: wound care instructions given   Additional details:  Prior to procedure, discussed risks of blister formation, small wound, skin dyspigmentation, or rare scar following cryotherapy. Recommend Vaseline ointment to treated areas while healing.   Inflamed seborrheic keratosis (5) L post auricular neck x 5  Destruction of lesion - L post auricular neck x 5  Destruction method:  cryotherapy   Informed consent: discussed and consent obtained   Lesion destroyed using liquid nitrogen: Yes   Region frozen until ice ball extended beyond lesion: Yes   Outcome: patient tolerated procedure well with no complications   Post-procedure details: wound care instructions given   Additional details:  Prior to procedure, discussed risks of blister formation, small wound, skin dyspigmentation, or rare scar following cryotherapy. Recommend Vaseline ointment to treated areas while healing.    Return in about 6 months (around 11/23/2021) for AK/ISK f/u.  I, Othelia Pulling, RMA, am acting as scribe for Brendolyn Patty, MD .  Documentation: I have reviewed the above documentation for accuracy and completeness, and I agree with the above.  Brendolyn Patty MD

## 2021-05-31 ENCOUNTER — Inpatient Hospital Stay: Payer: Medicare HMO

## 2021-05-31 VITALS — BP 142/55 | HR 67

## 2021-05-31 DIAGNOSIS — D631 Anemia in chronic kidney disease: Secondary | ICD-10-CM

## 2021-05-31 DIAGNOSIS — I129 Hypertensive chronic kidney disease with stage 1 through stage 4 chronic kidney disease, or unspecified chronic kidney disease: Secondary | ICD-10-CM | POA: Diagnosis not present

## 2021-05-31 DIAGNOSIS — N184 Chronic kidney disease, stage 4 (severe): Secondary | ICD-10-CM | POA: Diagnosis not present

## 2021-05-31 DIAGNOSIS — Z79899 Other long term (current) drug therapy: Secondary | ICD-10-CM | POA: Diagnosis not present

## 2021-05-31 LAB — HEMOGLOBIN AND HEMATOCRIT, BLOOD
HCT: 28.2 % — ABNORMAL LOW (ref 39.0–52.0)
Hemoglobin: 8.2 g/dL — ABNORMAL LOW (ref 13.0–17.0)

## 2021-05-31 MED ORDER — EPOETIN ALFA-EPBX 20000 UNIT/ML IJ SOLN
20000.0000 [IU] | Freq: Once | INTRAMUSCULAR | Status: AC
  Administered 2021-05-31: 20000 [IU] via SUBCUTANEOUS
  Filled 2021-05-31: qty 1

## 2021-05-31 MED ORDER — EPOETIN ALFA-EPBX 20000 UNIT/ML IJ SOLN: 60000.0000 [IU] | Freq: Once | INTRAMUSCULAR | Status: DC

## 2021-05-31 MED ORDER — EPOETIN ALFA-EPBX 40000 UNIT/ML IJ SOLN
40000.0000 [IU] | Freq: Once | INTRAMUSCULAR | Status: AC
  Administered 2021-05-31: 40000 [IU] via SUBCUTANEOUS
  Filled 2021-05-31: qty 1

## 2021-06-14 ENCOUNTER — Inpatient Hospital Stay: Payer: Medicare HMO | Attending: Oncology

## 2021-06-14 ENCOUNTER — Other Ambulatory Visit: Payer: Self-pay

## 2021-06-14 ENCOUNTER — Inpatient Hospital Stay: Payer: Medicare HMO

## 2021-06-14 VITALS — BP 129/53 | HR 82

## 2021-06-14 DIAGNOSIS — D631 Anemia in chronic kidney disease: Secondary | ICD-10-CM | POA: Diagnosis not present

## 2021-06-14 DIAGNOSIS — N1832 Chronic kidney disease, stage 3b: Secondary | ICD-10-CM | POA: Diagnosis not present

## 2021-06-14 LAB — HEMOGLOBIN AND HEMATOCRIT, BLOOD
HCT: 29.3 % — ABNORMAL LOW (ref 39.0–52.0)
Hemoglobin: 8.6 g/dL — ABNORMAL LOW (ref 13.0–17.0)

## 2021-06-14 MED ORDER — EPOETIN ALFA-EPBX 20000 UNIT/ML IJ SOLN
20000.0000 [IU] | Freq: Once | INTRAMUSCULAR | Status: AC
  Administered 2021-06-14: 20000 [IU] via SUBCUTANEOUS
  Filled 2021-06-14: qty 1

## 2021-06-14 MED ORDER — EPOETIN ALFA-EPBX 40000 UNIT/ML IJ SOLN
40000.0000 [IU] | Freq: Once | INTRAMUSCULAR | Status: AC
  Administered 2021-06-14: 40000 [IU] via SUBCUTANEOUS
  Filled 2021-06-14: qty 1

## 2021-06-14 MED ORDER — EPOETIN ALFA-EPBX 20000 UNIT/ML IJ SOLN: 60000.0000 [IU] | Freq: Once | INTRAMUSCULAR | Status: DC

## 2021-06-27 ENCOUNTER — Other Ambulatory Visit: Payer: Medicare HMO

## 2021-06-27 ENCOUNTER — Ambulatory Visit: Payer: Medicare HMO | Admitting: Oncology

## 2021-06-27 ENCOUNTER — Ambulatory Visit: Payer: Medicare HMO

## 2021-06-29 ENCOUNTER — Inpatient Hospital Stay: Payer: Medicare HMO

## 2021-06-29 ENCOUNTER — Encounter: Payer: Self-pay | Admitting: Oncology

## 2021-06-29 ENCOUNTER — Inpatient Hospital Stay (HOSPITAL_BASED_OUTPATIENT_CLINIC_OR_DEPARTMENT_OTHER): Payer: Medicare HMO | Admitting: Oncology

## 2021-06-29 VITALS — BP 123/67 | HR 80 | Temp 97.1°F

## 2021-06-29 DIAGNOSIS — N189 Chronic kidney disease, unspecified: Secondary | ICD-10-CM

## 2021-06-29 DIAGNOSIS — N1832 Chronic kidney disease, stage 3b: Secondary | ICD-10-CM

## 2021-06-29 DIAGNOSIS — E1122 Type 2 diabetes mellitus with diabetic chronic kidney disease: Secondary | ICD-10-CM | POA: Diagnosis not present

## 2021-06-29 DIAGNOSIS — N184 Chronic kidney disease, stage 4 (severe): Secondary | ICD-10-CM

## 2021-06-29 DIAGNOSIS — R7989 Other specified abnormal findings of blood chemistry: Secondary | ICD-10-CM | POA: Diagnosis not present

## 2021-06-29 DIAGNOSIS — D631 Anemia in chronic kidney disease: Secondary | ICD-10-CM

## 2021-06-29 LAB — CBC WITH DIFFERENTIAL/PLATELET
Abs Immature Granulocytes: 0.03 10*3/uL (ref 0.00–0.07)
Basophils Absolute: 0.1 10*3/uL (ref 0.0–0.1)
Basophils Relative: 1 %
Eosinophils Absolute: 0.1 10*3/uL (ref 0.0–0.5)
Eosinophils Relative: 2 %
HCT: 28.7 % — ABNORMAL LOW (ref 39.0–52.0)
Hemoglobin: 8.3 g/dL — ABNORMAL LOW (ref 13.0–17.0)
Immature Granulocytes: 1 %
Lymphocytes Relative: 20 %
Lymphs Abs: 1.1 10*3/uL (ref 0.7–4.0)
MCH: 27.7 pg (ref 26.0–34.0)
MCHC: 28.9 g/dL — ABNORMAL LOW (ref 30.0–36.0)
MCV: 95.7 fL (ref 80.0–100.0)
Monocytes Absolute: 0.6 10*3/uL (ref 0.1–1.0)
Monocytes Relative: 10 %
Neutro Abs: 3.8 10*3/uL (ref 1.7–7.7)
Neutrophils Relative %: 66 %
Platelets: 189 10*3/uL (ref 150–400)
RBC: 3 MIL/uL — ABNORMAL LOW (ref 4.22–5.81)
RDW: 19.7 % — ABNORMAL HIGH (ref 11.5–15.5)
WBC: 5.7 10*3/uL (ref 4.0–10.5)
nRBC: 0 % (ref 0.0–0.2)

## 2021-06-29 LAB — IRON AND TIBC
Iron: 154 ug/dL (ref 45–182)
Saturation Ratios: 92 % — ABNORMAL HIGH (ref 17.9–39.5)
TIBC: 167 ug/dL — ABNORMAL LOW (ref 250–450)
UIBC: 13 ug/dL

## 2021-06-29 LAB — RETIC PANEL
Immature Retic Fract: 10.2 % (ref 2.3–15.9)
RBC.: 2.96 MIL/uL — ABNORMAL LOW (ref 4.22–5.81)
Retic Count, Absolute: 20.7 10*3/uL (ref 19.0–186.0)
Retic Ct Pct: 0.7 % (ref 0.4–3.1)
Reticulocyte Hemoglobin: 23.7 pg — ABNORMAL LOW (ref >27.9)

## 2021-06-29 LAB — FERRITIN: Ferritin: 1135 ng/mL — ABNORMAL HIGH (ref 24–336)

## 2021-06-29 MED ORDER — EPOETIN ALFA-EPBX 20000 UNIT/ML IJ SOLN
20000.0000 [IU] | Freq: Once | INTRAMUSCULAR | Status: AC
  Administered 2021-06-29: 20000 [IU] via SUBCUTANEOUS
  Filled 2021-06-29: qty 1

## 2021-06-29 MED ORDER — EPOETIN ALFA-EPBX 20000 UNIT/ML IJ SOLN: 60000.0000 [IU] | Freq: Once | INTRAMUSCULAR | Status: DC

## 2021-06-29 MED ORDER — EPOETIN ALFA-EPBX 40000 UNIT/ML IJ SOLN
40000.0000 [IU] | Freq: Once | INTRAMUSCULAR | Status: AC
  Administered 2021-06-29: 40000 [IU] via SUBCUTANEOUS
  Filled 2021-06-29: qty 1

## 2021-06-29 NOTE — Assessment & Plan Note (Addendum)
Patient's iron panel is not reliable due to chronic inflammation. Patient has persistently decreased reticulocyte hemoglobin, indicating underlying iron deficiency. Ferritin is chronically elevated.  Check hemochromatosis mutation  previous IV Venofer treatment has helped stabilizing hemoglobin.

## 2021-06-29 NOTE — Assessment & Plan Note (Signed)
MDS cannot be ruled out.  Patient declines bone marrow biopsy due to multiple other medical problems Labs reviewed and discussed with patient Hemoglobin has improved.  Stable in 8s  Proceed with Retacrit 60,000 units x 1 today.  Continue Retacrit every 2 weeks.

## 2021-06-29 NOTE — Addendum Note (Signed)
Addended by: Earlie Server on: 06/29/2021 04:20 PM   Modules accepted: Orders

## 2021-06-29 NOTE — Progress Notes (Signed)
Hematology/Oncology progress note [phone] [phone]   Patient Care Team: Baxter Hire, MD as PCP - General (Internal Medicine) Earlie Server, MD as Consulting Physician (Oncology) Earlie Server, MD as Consulting Physician (Hematology and Oncology)  ASSESSMENT & PLAN:   Anemia in chronic kidney disease (CKD) MDS cannot be ruled out.  Patient declines bone marrow biopsy due to multiple other medical problems Labs reviewed and discussed with patient Hemoglobin has improved.  Stable in 8s  Proceed with Retacrit 60,000 units x 1 today.  Continue Retacrit every 2 weeks.   Elevated ferritin level Patient's iron panel is not reliable due to chronic inflammation. Patient has persistently decreased reticulocyte hemoglobin, indicating underlying iron deficiency. Ferritin is chronically elevated.  Check hemochromatosis mutation  previous IV Venofer treatment has helped stabilizing hemoglobin.  Orders Placed This Encounter  Procedures   CBC with Differential/Platelet    Standing Status:   Future    Standing Expiration Date:   06/30/2022   Comprehensive metabolic panel    Standing Status:   Future    Standing Expiration Date:   06/30/2022   Ferritin    Standing Status:   Future    Standing Expiration Date:   06/30/2022   Iron and TIBC    Standing Status:   Future    Standing Expiration Date:   06/30/2022   H&H in 2,4, 6, 8, 10 weeks +/- Retacrit.  Lab CBC, iron TIBC ferritin, CMP, MD +/- Retacrit in 12 weeks.  All questions were answered. The patient knows to call the clinic with any problems, questions or concerns.  Earlie Server, MD, PhD [facility] Hematology Oncology 06/29/2021     CHIEF COMPLAINTS/REASON FOR VISIT:  Follow-up for anemia secondary to chronic kidney disease.  Possible MDS   INTERVAL HISTORY Arthur [name] is a 88 y.o. male who has above history reviewed by me today presents for follow up visit for management of anemia secondary to chronic kidney  disease, possible underlying MDS  Accompanied by his daughter.  Chronic fatigue, at baseline. Back pain is stable.  Patient has been on erythropoietin therapy every 3 weeks.  Review of Systems  Constitutional:  Positive for fatigue. Negative for appetite change, chills, fever and unexpected weight change.  HENT:   Negative for hearing loss and voice change.   Eyes:  Negative for eye problems and icterus.  Respiratory:  Negative for chest tightness, cough and shortness of breath.   Cardiovascular:  Negative for chest pain and leg swelling.  Gastrointestinal:  Negative for abdominal distention and abdominal pain.  Endocrine: Negative for hot flashes.  Genitourinary:  Negative for difficulty urinating, dysuria and frequency.   Musculoskeletal:  Negative for arthralgias.       Chronic back pain  Skin:  Negative for itching and rash.  Neurological:  Negative for light-headedness and numbness.  Hematological:  Negative for adenopathy. Does not bruise/bleed easily.  Psychiatric/Behavioral:  Negative for confusion.     MEDICAL HISTORY:  Past Medical History:  Diagnosis Date   Anemia of chronic kidney failure 08/21/2018   Arthritis    lower back   CHF (congestive heart failure) (HCC)    CKD (chronic kidney disease), stage III (HCC)    COPD (chronic obstructive pulmonary disease) (HCC)    Diabetes mellitus without complication (HCC)    type 2   Dyspnea    when active -wears oxygen 2L via [place]   History of blood transfusion    Hyperlipidemia    Hypertension  Requires continuous at home supplemental oxygen    2L via [place]   Squamous cell carcinoma in situ 09/17/2012   Right upper nasal dorsum. SCCis arising in [place]   Squamous cell carcinoma of skin 02/03/2020   R lat cheek, [facility]    SURGICAL HISTORY: Past Surgical History:  Procedure Laterality Date   COLONOSCOPY WITH PROPOFOL N/A 06/11/2018   Procedure: COLONOSCOPY WITH PROPOFOL;  Surgeon: Jonathon Bellows, MD;  Location: [facility] ENDOSCOPY;   Service: Gastroenterology;  Laterality: N/A;   COLONOSCOPY WITH PROPOFOL N/A 06/13/2018   Procedure: COLONOSCOPY WITH PROPOFOL;  Surgeon: Jonathon Bellows, MD;  Location: [facility] ENDOSCOPY;  Service: Gastroenterology;  Laterality: N/A;   ESOPHAGOGASTRODUODENOSCOPY Left 06/10/2018   Procedure: ESOPHAGOGASTRODUODENOSCOPY (EGD);  Surgeon: Jonathon Bellows, MD;  Location: [facility] ENDOSCOPY;  Service: Gastroenterology;  Laterality: Left;   EYE SURGERY     removed cataracts   GIVENS CAPSULE STUDY N/A 06/28/2018   Procedure: GIVENS CAPSULE STUDY;  Surgeon: Lucilla Lame, MD;  Location: [facility] ENDOSCOPY;  Service: Endoscopy;  Laterality: N/A;   GIVENS CAPSULE STUDY N/A 06/30/2018   Procedure: GIVENS CAPSULE STUDY;  Surgeon: Jonathon Bellows, MD;  Location: [facility] ENDOSCOPY;  Service: Gastroenterology;  Laterality: N/A;   KYPHOPLASTY N/A 10/31/2018   Procedure: KYPHOPLASTY L2;  Surgeon: [name], MD;  Location: [facility] ORS;  Service: Orthopedics;  Laterality: N/A;   KYPHOPLASTY N/A 12/24/2018   Procedure: KYPHOPLASTY L3;  Surgeon: [name], MD;  Location: [facility] ORS;  Service: Orthopedics;  Laterality: N/A;   KYPHOPLASTY N/A 02/18/2019   Procedure: L2 KYPHOPLASTY;  Surgeon: [name], MD;  Location: [facility] ORS;  Service: Orthopedics;  Laterality: N/A;   RADIOLOGY WITH ANESTHESIA N/A 01/28/2019   Procedure: MRI LUMBER SPINE WITHOUT CONTRAST;  Surgeon: Radiologist, Medication, MD;  Location: [place];  Service: Radiology;  Laterality: N/A;    SOCIAL HISTORY: Social History   Socioeconomic History   Marital status: Widowed    Spouse name: Not on file   Number of children: Not on file   Years of education: Not on file   Highest education level: Not on file  Occupational History   Not on file  Tobacco Use   Smoking status: Former    Packs/day: 2.00    Years: 35.00    Total pack years: 70.00    Types: Cigarettes    Quit date: 11/04/1982    Years since quitting: 38.6   Smokeless tobacco: Current    Types: Chew  Vaping  Use   Vaping Use: Never used  Substance and Sexual Activity   Alcohol use: Not Currently   Drug use: No   Sexual activity: Not Currently  Other Topics Concern   Not on file  Social History Narrative   Not on file   Social Determinants of Health   Financial Resource Strain: Low Risk  (07/12/2018)   Overall Financial Resource Strain (CARDIA)    Difficulty of Paying Living Expenses: Not hard at all  Food Insecurity: No Food Insecurity (07/12/2018)   Hunger Vital Sign    Worried About Running Out of Food in the Last Year: Never true    [place] in the Last Year: Never true  Transportation Needs: No Transportation Needs (06/27/2018)   PRAPARE - Hydrologist (Medical): No    Lack of Transportation (Non-Medical): No  Physical Activity: Unknown (06/27/2018)   Exercise Vital Sign    Days of Exercise per Week: 0 days    Minutes of Exercise per Session: Not on  file  Stress: No Stress Concern Present (06/27/2018)   [place]    Feeling of Stress : Not at all  Social Connections: Not on file  Intimate Partner Violence: Unknown (06/27/2018)   Humiliation, Afraid, Rape, and Kick questionnaire    Fear of Current or Ex-Partner: Patient refused    Emotionally Abused: Patient refused    Physically Abused: Patient refused    Sexually Abused: Patient refused    FAMILY HISTORY: Family History  Problem Relation Age of Onset   COPD Mother    Cancer Father     ALLERGIES:  is allergic to codeine.  MEDICATIONS:  Current Outpatient Medications  Medication Sig Dispense Refill   acetaminophen (TYLENOL) 325 MG tablet Take 2 tablets (650 mg total) by mouth every 6 (six) hours as needed for mild pain (or Fever >/= 101).     albuterol (VENTOLIN HFA) 108 (90 Base) MCG/ACT inhaler Inhale 2 puffs into the lungs every 6 (six) hours as needed for wheezing or shortness of breath. 1 Inhaler 2   amLODipine  (NORVASC) 2.5 MG tablet Take 2.5 mg by mouth daily.     insulin aspart (NOVOLOG) 100 UNIT/ML injection 4 units prior to meals if sugars above 200 10 mL 11   LEVEMIR 100 UNIT/ML injection Inject 0.04 mLs (4 Units total) into the skin at bedtime. 10 mL 0   pravastatin (PRAVACHOL) 10 MG tablet Take 10 mg by mouth at bedtime.     spironolactone (ALDACTONE) 25 MG tablet Take 25 mg by mouth daily.     tamsulosin (FLOMAX) 0.4 MG CAPS capsule Take 0.4 mg by mouth daily.     tiotropium (SPIRIVA) 18 MCG inhalation capsule Place 18 mcg into inhaler and inhale daily.     torsemide (DEMADEX) 20 MG tablet Take 20 mg by mouth every Monday, Wednesday, and Friday.     triamcinolone cream (KENALOG) 0.5 %      TRUE METRIX BLOOD GLUCOSE TEST test strip 1 each by Other route every morning.     ascorbic acid (VITAMIN C) 500 MG tablet Take 500 mg by mouth at bedtime. (Patient not taking: Reported on 06/29/2021)     docusate sodium (COLACE) 100 MG capsule Take 1 capsule (100 mg total) by mouth 2 (two) times daily. (Patient not taking: Reported on 09/29/2020) 60 capsule 0   metoCLOPramide (REGLAN) 5 MG tablet Take 5 mg by mouth 4 (four) times daily -  before meals and at bedtime. (Patient not taking: Reported on 03/31/2020)     pantoprazole (PROTONIX) 40 MG tablet Take 1 tablet (40 mg total) by mouth daily. (Patient not taking: Reported on 06/29/2021)     polyethylene glycol powder (GLYCOLAX/MIRALAX) 17 GM/SCOOP powder Take 17 g by mouth every other day.  (Patient not taking: Reported on 03/31/2020)     potassium chloride (KLOR-CON) 10 MEQ tablet Take 10 mEq by mouth every evening. (Patient not taking: Reported on 06/29/2021)     promethazine (PHENERGAN) 25 MG tablet Take 25 mg by mouth every 4 (four) hours as needed for nausea or vomiting. (Patient not taking: Reported on 03/31/2020)     No current facility-administered medications for this visit.   Facility-Administered Medications Ordered in Other Visits  Medication Dose  Route Frequency Provider Last Rate Last Admin   0.9 %  sodium chloride infusion   Intravenous Continuous Earlie Server, MD 10 mL/hr at 10/15/20 1452 New Bag at 10/15/20 1452     PHYSICAL EXAMINATION: ECOG PERFORMANCE  STATUS: 3 - Symptomatic, >50% confined to bed Vitals:   06/29/21 1011  BP: 123/67  Pulse: 80  Temp: (!) 97.1 F (36.2 C)   There were no vitals filed for this visit.   Physical Exam Constitutional:      General: He is not in acute distress.    Appearance: He is ill-appearing.     Comments: Sits in wheel chair  HENT:     Head: Normocephalic and atraumatic.  Eyes:     General: No scleral icterus.    Pupils: Pupils are equal, round, and reactive to light.  Cardiovascular:     Rate and Rhythm: Normal rate and regular rhythm.     Heart sounds: Normal heart sounds.  Pulmonary:     Effort: Pulmonary effort is normal. No respiratory distress.     Breath sounds: No wheezing.     Comments:  Decreased breath sounds at right base Abdominal:     General: Bowel sounds are normal. There is no distension.     Palpations: Abdomen is soft. There is no mass.     Tenderness: There is no abdominal tenderness.  Musculoskeletal:        General: No deformity.     Cervical back: Normal range of motion and neck supple.     Comments: Chronic bilateral lower extremity edema.    Skin:    General: Skin is warm and dry.     Coloration: Skin is not pale.     Findings: No erythema or rash.  Neurological:     Mental Status: He is alert. Mental status is at baseline.     Cranial Nerves: No cranial nerve deficit.     Coordination: Coordination normal.  Psychiatric:        Mood and Affect: Mood normal.     LABORATORY DATA:  I have reviewed the data as listed Lab Results  Component Value Date   WBC 5.7 06/29/2021   HGB 8.3 (L) 06/29/2021   HCT 28.7 (L) 06/29/2021   MCV 95.7 06/29/2021   PLT 189 06/29/2021   Recent Labs    12/22/20 1410  NA 135  K 4.2  CL 102  CO2 26   GLUCOSE 78  BUN 50*  CREATININE 2.01*  CALCIUM 8.3*  GFRNONAA 32*  PROT 7.1  ALBUMIN 3.8  AST 12*  ALT 9  ALKPHOS 68  BILITOT 0.3    Iron/TIBC/Ferritin/ %Sat    Component Value Date/Time   IRON 154 06/29/2021 0947   IRON 36 (L) 07/15/2018 1544   TIBC 167 (L) 06/29/2021 0947   TIBC 172 (L) 07/15/2018 1544   FERRITIN 1,135 (H) 06/29/2021 0947   FERRITIN 535 (H) 07/15/2018 1544   IRONPCTSAT 92 (H) 06/29/2021 0947   IRONPCTSAT 21 07/15/2018 1544      RADIOGRAPHIC STUDIES: I have personally reviewed the radiological images as listed and agreed with the findings in the report. No results found.

## 2021-07-12 ENCOUNTER — Inpatient Hospital Stay: Payer: Medicare HMO

## 2021-07-12 ENCOUNTER — Inpatient Hospital Stay: Payer: Medicare HMO | Attending: Oncology

## 2021-07-12 DIAGNOSIS — N184 Chronic kidney disease, stage 4 (severe): Secondary | ICD-10-CM | POA: Diagnosis not present

## 2021-07-12 DIAGNOSIS — I129 Hypertensive chronic kidney disease with stage 1 through stage 4 chronic kidney disease, or unspecified chronic kidney disease: Secondary | ICD-10-CM | POA: Diagnosis not present

## 2021-07-12 DIAGNOSIS — R7989 Other specified abnormal findings of blood chemistry: Secondary | ICD-10-CM

## 2021-07-12 DIAGNOSIS — D631 Anemia in chronic kidney disease: Secondary | ICD-10-CM

## 2021-07-12 DIAGNOSIS — E1122 Type 2 diabetes mellitus with diabetic chronic kidney disease: Secondary | ICD-10-CM | POA: Insufficient documentation

## 2021-07-12 LAB — HEMOGLOBIN AND HEMATOCRIT, BLOOD
HCT: 29 % — ABNORMAL LOW (ref 39.0–52.0)
Hemoglobin: 8.5 g/dL — ABNORMAL LOW (ref 13.0–17.0)

## 2021-07-12 MED ORDER — EPOETIN ALFA-EPBX 40000 UNIT/ML IJ SOLN
40000.0000 [IU] | Freq: Once | INTRAMUSCULAR | Status: AC
  Administered 2021-07-12: 40000 [IU] via SUBCUTANEOUS
  Filled 2021-07-12: qty 1

## 2021-07-12 MED ORDER — EPOETIN ALFA-EPBX 20000 UNIT/ML IJ SOLN: 60000.0000 [IU] | Freq: Once | INTRAMUSCULAR | Status: DC

## 2021-07-12 MED ORDER — EPOETIN ALFA-EPBX 10000 UNIT/ML IJ SOLN
20000.0000 [IU] | Freq: Once | INTRAMUSCULAR | Status: AC
  Administered 2021-07-12: 20000 [IU] via SUBCUTANEOUS
  Filled 2021-07-12: qty 2

## 2021-07-13 ENCOUNTER — Inpatient Hospital Stay: Payer: Medicare HMO

## 2021-07-18 LAB — HEMOCHROMATOSIS DNA-PCR(C282Y,H63D)

## 2021-07-26 ENCOUNTER — Other Ambulatory Visit: Payer: Self-pay

## 2021-07-26 DIAGNOSIS — N189 Chronic kidney disease, unspecified: Secondary | ICD-10-CM

## 2021-07-27 ENCOUNTER — Inpatient Hospital Stay: Payer: Medicare HMO

## 2021-07-27 VITALS — BP 133/55 | HR 67

## 2021-07-27 DIAGNOSIS — D631 Anemia in chronic kidney disease: Secondary | ICD-10-CM | POA: Diagnosis not present

## 2021-07-27 DIAGNOSIS — N184 Chronic kidney disease, stage 4 (severe): Secondary | ICD-10-CM | POA: Diagnosis not present

## 2021-07-27 DIAGNOSIS — E1122 Type 2 diabetes mellitus with diabetic chronic kidney disease: Secondary | ICD-10-CM | POA: Diagnosis not present

## 2021-07-27 DIAGNOSIS — I129 Hypertensive chronic kidney disease with stage 1 through stage 4 chronic kidney disease, or unspecified chronic kidney disease: Secondary | ICD-10-CM | POA: Diagnosis not present

## 2021-07-27 LAB — HEMOGLOBIN AND HEMATOCRIT, BLOOD
HCT: 27.7 % — ABNORMAL LOW (ref 39.0–52.0)
Hemoglobin: 8.3 g/dL — ABNORMAL LOW (ref 13.0–17.0)

## 2021-07-27 MED ORDER — EPOETIN ALFA-EPBX 20000 UNIT/ML IJ SOLN: 60000.0000 [IU] | Freq: Once | INTRAMUSCULAR | Status: DC

## 2021-07-27 MED ORDER — EPOETIN ALFA-EPBX 40000 UNIT/ML IJ SOLN
40000.0000 [IU] | Freq: Once | INTRAMUSCULAR | Status: AC
  Administered 2021-07-27: 40000 [IU] via SUBCUTANEOUS
  Filled 2021-07-27: qty 1

## 2021-07-27 MED ORDER — EPOETIN ALFA-EPBX 10000 UNIT/ML IJ SOLN
20000.0000 [IU] | Freq: Once | INTRAMUSCULAR | Status: AC
  Administered 2021-07-27: 20000 [IU] via SUBCUTANEOUS
  Filled 2021-07-27: qty 2

## 2021-08-09 DIAGNOSIS — I509 Heart failure, unspecified: Secondary | ICD-10-CM | POA: Diagnosis not present

## 2021-08-09 DIAGNOSIS — E7800 Pure hypercholesterolemia, unspecified: Secondary | ICD-10-CM | POA: Diagnosis not present

## 2021-08-09 DIAGNOSIS — J431 Panlobular emphysema: Secondary | ICD-10-CM | POA: Diagnosis not present

## 2021-08-09 DIAGNOSIS — E119 Type 2 diabetes mellitus without complications: Secondary | ICD-10-CM | POA: Diagnosis not present

## 2021-08-09 DIAGNOSIS — J9611 Chronic respiratory failure with hypoxia: Secondary | ICD-10-CM | POA: Diagnosis not present

## 2021-08-09 DIAGNOSIS — I10 Essential (primary) hypertension: Secondary | ICD-10-CM | POA: Diagnosis not present

## 2021-08-09 DIAGNOSIS — N1832 Chronic kidney disease, stage 3b: Secondary | ICD-10-CM | POA: Diagnosis not present

## 2021-08-10 ENCOUNTER — Inpatient Hospital Stay: Payer: Medicare HMO | Attending: Oncology

## 2021-08-10 ENCOUNTER — Inpatient Hospital Stay: Payer: Medicare HMO

## 2021-08-10 VITALS — BP 133/53 | HR 78

## 2021-08-10 DIAGNOSIS — I129 Hypertensive chronic kidney disease with stage 1 through stage 4 chronic kidney disease, or unspecified chronic kidney disease: Secondary | ICD-10-CM | POA: Diagnosis not present

## 2021-08-10 DIAGNOSIS — N184 Chronic kidney disease, stage 4 (severe): Secondary | ICD-10-CM | POA: Insufficient documentation

## 2021-08-10 DIAGNOSIS — D631 Anemia in chronic kidney disease: Secondary | ICD-10-CM | POA: Insufficient documentation

## 2021-08-10 DIAGNOSIS — Z79899 Other long term (current) drug therapy: Secondary | ICD-10-CM | POA: Insufficient documentation

## 2021-08-10 DIAGNOSIS — N189 Chronic kidney disease, unspecified: Secondary | ICD-10-CM

## 2021-08-10 LAB — HEMOGLOBIN AND HEMATOCRIT, BLOOD
HCT: 27 % — ABNORMAL LOW (ref 39.0–52.0)
Hemoglobin: 8 g/dL — ABNORMAL LOW (ref 13.0–17.0)

## 2021-08-10 MED ORDER — EPOETIN ALFA-EPBX 40000 UNIT/ML IJ SOLN
40000.0000 [IU] | Freq: Once | INTRAMUSCULAR | Status: AC
  Administered 2021-08-10: 40000 [IU] via SUBCUTANEOUS

## 2021-08-10 MED ORDER — EPOETIN ALFA-EPBX 20000 UNIT/ML IJ SOLN: 60000.0000 [IU] | Freq: Once | INTRAMUSCULAR | Status: DC

## 2021-08-10 MED ORDER — EPOETIN ALFA-EPBX 20000 UNIT/ML IJ SOLN
20000.0000 [IU] | Freq: Once | INTRAMUSCULAR | Status: AC
  Administered 2021-08-10: 20000 [IU] via SUBCUTANEOUS

## 2021-08-19 DIAGNOSIS — E1142 Type 2 diabetes mellitus with diabetic polyneuropathy: Secondary | ICD-10-CM | POA: Diagnosis not present

## 2021-08-19 DIAGNOSIS — B351 Tinea unguium: Secondary | ICD-10-CM | POA: Diagnosis not present

## 2021-08-24 ENCOUNTER — Inpatient Hospital Stay: Payer: Medicare HMO

## 2021-08-24 VITALS — BP 171/66 | HR 65

## 2021-08-24 DIAGNOSIS — D631 Anemia in chronic kidney disease: Secondary | ICD-10-CM

## 2021-08-24 DIAGNOSIS — I129 Hypertensive chronic kidney disease with stage 1 through stage 4 chronic kidney disease, or unspecified chronic kidney disease: Secondary | ICD-10-CM | POA: Diagnosis not present

## 2021-08-24 DIAGNOSIS — Z79899 Other long term (current) drug therapy: Secondary | ICD-10-CM | POA: Diagnosis not present

## 2021-08-24 DIAGNOSIS — N184 Chronic kidney disease, stage 4 (severe): Secondary | ICD-10-CM | POA: Diagnosis not present

## 2021-08-24 LAB — HEMOGLOBIN AND HEMATOCRIT, BLOOD
HCT: 27.5 % — ABNORMAL LOW (ref 39.0–52.0)
Hemoglobin: 8.1 g/dL — ABNORMAL LOW (ref 13.0–17.0)

## 2021-08-24 MED ORDER — EPOETIN ALFA-EPBX 20000 UNIT/ML IJ SOLN: 60000.0000 [IU] | Freq: Once | INTRAMUSCULAR | Status: DC

## 2021-08-24 MED ORDER — EPOETIN ALFA-EPBX 40000 UNIT/ML IJ SOLN
40000.0000 [IU] | Freq: Once | INTRAMUSCULAR | Status: AC
  Administered 2021-08-24: 40000 [IU] via SUBCUTANEOUS
  Filled 2021-08-24: qty 1

## 2021-08-24 MED ORDER — EPOETIN ALFA-EPBX 20000 UNIT/ML IJ SOLN
20000.0000 [IU] | Freq: Once | INTRAMUSCULAR | Status: AC
  Administered 2021-08-24: 20000 [IU] via SUBCUTANEOUS
  Filled 2021-08-24: qty 1

## 2021-09-07 ENCOUNTER — Inpatient Hospital Stay: Payer: Medicare HMO

## 2021-09-07 ENCOUNTER — Inpatient Hospital Stay: Payer: Medicare HMO | Attending: Oncology

## 2021-09-07 VITALS — BP 153/64 | HR 89

## 2021-09-07 DIAGNOSIS — N184 Chronic kidney disease, stage 4 (severe): Secondary | ICD-10-CM | POA: Diagnosis not present

## 2021-09-07 DIAGNOSIS — I129 Hypertensive chronic kidney disease with stage 1 through stage 4 chronic kidney disease, or unspecified chronic kidney disease: Secondary | ICD-10-CM | POA: Insufficient documentation

## 2021-09-07 DIAGNOSIS — Z79899 Other long term (current) drug therapy: Secondary | ICD-10-CM | POA: Insufficient documentation

## 2021-09-07 DIAGNOSIS — D631 Anemia in chronic kidney disease: Secondary | ICD-10-CM | POA: Insufficient documentation

## 2021-09-07 LAB — HEMOGLOBIN AND HEMATOCRIT, BLOOD
HCT: 27.4 % — ABNORMAL LOW (ref 39.0–52.0)
Hemoglobin: 8.1 g/dL — ABNORMAL LOW (ref 13.0–17.0)

## 2021-09-07 MED ORDER — EPOETIN ALFA-EPBX 20000 UNIT/ML IJ SOLN: 60000.0000 [IU] | Freq: Once | INTRAMUSCULAR | Status: DC

## 2021-09-07 MED ORDER — EPOETIN ALFA-EPBX 40000 UNIT/ML IJ SOLN
40000.0000 [IU] | Freq: Once | INTRAMUSCULAR | Status: AC
  Administered 2021-09-07: 40000 [IU] via SUBCUTANEOUS
  Filled 2021-09-07: qty 1

## 2021-09-07 MED ORDER — EPOETIN ALFA-EPBX 10000 UNIT/ML IJ SOLN
20000.0000 [IU] | Freq: Once | INTRAMUSCULAR | Status: AC
  Administered 2021-09-07: 20000 [IU] via SUBCUTANEOUS
  Filled 2021-09-07: qty 2

## 2021-09-07 MED ORDER — EPOETIN ALFA-EPBX 20000 UNIT/ML IJ SOLN
20000.0000 [IU] | Freq: Once | INTRAMUSCULAR | Status: DC
  Filled 2021-09-07: qty 1

## 2021-09-21 ENCOUNTER — Other Ambulatory Visit: Payer: Medicare HMO

## 2021-09-21 ENCOUNTER — Inpatient Hospital Stay (HOSPITAL_BASED_OUTPATIENT_CLINIC_OR_DEPARTMENT_OTHER): Payer: Medicare HMO | Admitting: Oncology

## 2021-09-21 ENCOUNTER — Inpatient Hospital Stay: Payer: Medicare HMO

## 2021-09-21 ENCOUNTER — Ambulatory Visit: Payer: Medicare HMO | Admitting: Oncology

## 2021-09-21 ENCOUNTER — Ambulatory Visit: Payer: Medicare HMO

## 2021-09-21 ENCOUNTER — Encounter: Payer: Self-pay | Admitting: Oncology

## 2021-09-21 VITALS — BP 155/54 | HR 82 | Temp 97.2°F | Ht 68.0 in | Wt 173.2 lb

## 2021-09-21 DIAGNOSIS — I129 Hypertensive chronic kidney disease with stage 1 through stage 4 chronic kidney disease, or unspecified chronic kidney disease: Secondary | ICD-10-CM | POA: Diagnosis not present

## 2021-09-21 DIAGNOSIS — R7989 Other specified abnormal findings of blood chemistry: Secondary | ICD-10-CM

## 2021-09-21 DIAGNOSIS — N189 Chronic kidney disease, unspecified: Secondary | ICD-10-CM

## 2021-09-21 DIAGNOSIS — Z79899 Other long term (current) drug therapy: Secondary | ICD-10-CM | POA: Diagnosis not present

## 2021-09-21 DIAGNOSIS — E1122 Type 2 diabetes mellitus with diabetic chronic kidney disease: Secondary | ICD-10-CM | POA: Diagnosis not present

## 2021-09-21 DIAGNOSIS — D631 Anemia in chronic kidney disease: Secondary | ICD-10-CM | POA: Diagnosis not present

## 2021-09-21 DIAGNOSIS — N184 Chronic kidney disease, stage 4 (severe): Secondary | ICD-10-CM

## 2021-09-21 LAB — FERRITIN: Ferritin: 913 ng/mL — ABNORMAL HIGH (ref 24–336)

## 2021-09-21 LAB — CBC WITH DIFFERENTIAL/PLATELET
Abs Immature Granulocytes: 0.03 10*3/uL (ref 0.00–0.07)
Basophils Absolute: 0.1 10*3/uL (ref 0.0–0.1)
Basophils Relative: 1 %
Eosinophils Absolute: 0.1 10*3/uL (ref 0.0–0.5)
Eosinophils Relative: 2 %
HCT: 26.9 % — ABNORMAL LOW (ref 39.0–52.0)
Hemoglobin: 8.1 g/dL — ABNORMAL LOW (ref 13.0–17.0)
Immature Granulocytes: 1 %
Lymphocytes Relative: 22 %
Lymphs Abs: 1.3 10*3/uL (ref 0.7–4.0)
MCH: 28.8 pg (ref 26.0–34.0)
MCHC: 30.1 g/dL (ref 30.0–36.0)
MCV: 95.7 fL (ref 80.0–100.0)
Monocytes Absolute: 0.5 10*3/uL (ref 0.1–1.0)
Monocytes Relative: 9 %
Neutro Abs: 3.6 10*3/uL (ref 1.7–7.7)
Neutrophils Relative %: 65 %
Platelets: 182 10*3/uL (ref 150–400)
RBC: 2.81 MIL/uL — ABNORMAL LOW (ref 4.22–5.81)
RDW: 19.5 % — ABNORMAL HIGH (ref 11.5–15.5)
WBC: 5.6 10*3/uL (ref 4.0–10.5)
nRBC: 0.4 % — ABNORMAL HIGH (ref 0.0–0.2)

## 2021-09-21 LAB — IRON AND TIBC
Iron: 93 ug/dL (ref 45–182)
Saturation Ratios: 54 % — ABNORMAL HIGH (ref 17.9–39.5)
TIBC: 172 ug/dL — ABNORMAL LOW (ref 250–450)
UIBC: 79 ug/dL

## 2021-09-21 LAB — COMPREHENSIVE METABOLIC PANEL
ALT: 8 U/L (ref 0–44)
AST: 11 U/L — ABNORMAL LOW (ref 15–41)
Albumin: 3.5 g/dL (ref 3.5–5.0)
Alkaline Phosphatase: 66 U/L (ref 38–126)
Anion gap: 3 — ABNORMAL LOW (ref 5–15)
BUN: 69 mg/dL — ABNORMAL HIGH (ref 8–23)
CO2: 22 mmol/L (ref 22–32)
Calcium: 7.7 mg/dL — ABNORMAL LOW (ref 8.9–10.3)
Chloride: 106 mmol/L (ref 98–111)
Creatinine, Ser: 2.75 mg/dL — ABNORMAL HIGH (ref 0.61–1.24)
GFR, Estimated: 22 mL/min — ABNORMAL LOW (ref >60)
Glucose, Bld: 219 mg/dL — ABNORMAL HIGH (ref 70–99)
Potassium: 5.7 mmol/L — ABNORMAL HIGH (ref 3.5–5.1)
Sodium: 131 mmol/L — ABNORMAL LOW (ref 135–145)
Total Bilirubin: 0.4 mg/dL (ref 0.3–1.2)
Total Protein: 7 g/dL (ref 6.5–8.1)

## 2021-09-21 MED ORDER — EPOETIN ALFA-EPBX 20000 UNIT/ML IJ SOLN
20000.0000 [IU] | Freq: Once | INTRAMUSCULAR | Status: AC
  Administered 2021-09-21: 20000 [IU] via SUBCUTANEOUS
  Filled 2021-09-21: qty 1

## 2021-09-21 MED ORDER — EPOETIN ALFA-EPBX 20000 UNIT/ML IJ SOLN: 60000.0000 [IU] | Freq: Once | INTRAMUSCULAR | Status: DC

## 2021-09-21 MED ORDER — EPOETIN ALFA-EPBX 40000 UNIT/ML IJ SOLN
40000.0000 [IU] | Freq: Once | INTRAMUSCULAR | Status: AC
  Administered 2021-09-21: 40000 [IU] via SUBCUTANEOUS
  Filled 2021-09-21: qty 1

## 2021-09-21 NOTE — Assessment & Plan Note (Signed)
Creatinine is worse.  Hyperkalemia due to CKD.  Recommend patient to discussed with PCP and nephrology

## 2021-09-21 NOTE — Assessment & Plan Note (Signed)
MDS cannot be ruled out.  Patient declines bone marrow biopsy due to multiple other medical problems Labs reviewed and discussed with patient Hemoglobin is Stable in low 8s  Proceed with Retacrit 60,000 units every 2 weeks.

## 2021-09-21 NOTE — Progress Notes (Signed)
Hematology/Oncology Progress note [phone] [phone]      Patient Care Team: Baxter Hire, MD as PCP - General (Internal Medicine) Earlie Server, MD as Consulting Physician (Oncology) Earlie Server, MD as Consulting Physician (Hematology and Oncology)  ASSESSMENT & PLAN:   Anemia in chronic kidney disease (CKD) MDS cannot be ruled out.  Patient declines bone marrow biopsy due to multiple other medical problems Labs reviewed and discussed with patient Hemoglobin is Stable in low 8s  Proceed with Retacrit 60,000 units every 2 weeks.   CKD stage 4 due to type 2 diabetes mellitus (HCC) Creatinine is worse.  Hyperkalemia due to CKD.  Recommend patient to discussed with PCP and nephrology  Elevated ferritin level Patient's iron panel is not reliable due to chronic inflammation. Patient has persistently decreased reticulocyte hemoglobin, indicating underlying iron deficiency. Ferritin is chronically elevated.  negaive hemochromatosis mutation  Check soluble transferrin receptor previous IV Venofer treatment has helped stabilizing hemoglobin.  Orders Placed This Encounter  Procedures   Miscellaneous [facility] test (send-out)    Standing Status:   Future    Standing Expiration Date:   09/22/2022    Order Specific Question:   Test name / description:    Answer:   Soluble transferrin receptor [facility] [id number]   Hemoglobin and hematocrit, blood    Standing Status:   Standing    Number of Occurrences:   5    Standing Expiration Date:   09/22/2022   CBC with Differential/Platelet    Standing Status:   Future    Standing Expiration Date:   09/22/2022   Ferritin    Standing Status:   Future    Standing Expiration Date:   09/22/2022   Iron and TIBC    Standing Status:   Future    Standing Expiration Date:   09/22/2022   H&H in 2,4, 6, 8, 10 weeks +/- Retacrit.  Lab CBC, iron TIBC ferritin, CMP, MD +/- Retacrit in 12 weeks.  All questions were answered. The patient  knows to call the clinic with any problems, questions or concerns.  Earlie Server, MD, PhD [facility] Hematology Oncology 09/21/2021     CHIEF COMPLAINTS/REASON FOR VISIT:  Follow-up for anemia secondary to chronic kidney disease.  Possible MDS   INTERVAL HISTORY Arthur [name] is a 88 y.o. male who has above history reviewed by me today presents for follow up visit for management of anemia secondary to chronic kidney disease, possible underlying MDS  Accompanied by his daughter.  Chronic fatigue, at baseline. Back pain is stable.  Patient has been on erythropoietin therapy every 2 weeks.  Review of Systems  Constitutional:  Positive for fatigue. Negative for appetite change, chills, fever and unexpected weight change.  HENT:   Negative for hearing loss and voice change.   Eyes:  Negative for eye problems and icterus.  Respiratory:  Negative for chest tightness, cough and shortness of breath.   Cardiovascular:  Negative for chest pain and leg swelling.  Gastrointestinal:  Negative for abdominal distention and abdominal pain.  Endocrine: Negative for hot flashes.  Genitourinary:  Negative for difficulty urinating, dysuria and frequency.   Musculoskeletal:  Negative for arthralgias.       Chronic back pain  Skin:  Negative for itching and rash.  Neurological:  Negative for light-headedness and numbness.  Hematological:  Negative for adenopathy. Does not bruise/bleed easily.  Psychiatric/Behavioral:  Negative for confusion.     MEDICAL HISTORY:  Past Medical History:  Diagnosis Date  Anemia of chronic kidney failure 08/21/2018   Arthritis    lower back   CHF (congestive heart failure) (HCC)    CKD (chronic kidney disease), stage III (HCC)    COPD (chronic obstructive pulmonary disease) (HCC)    Diabetes mellitus without complication (HCC)    type 2   Dyspnea    when active -wears oxygen 2L via [place]   History of blood transfusion    Hyperlipidemia    Hypertension    Requires  continuous at home supplemental oxygen    2L via [place]   Squamous cell carcinoma in situ 09/17/2012   Right upper nasal dorsum. SCCis arising in AK   Squamous cell carcinoma of skin 02/03/2020   R lat cheek, [facility]    SURGICAL HISTORY: Past Surgical History:  Procedure Laterality Date   COLONOSCOPY WITH PROPOFOL N/A 06/11/2018   Procedure: COLONOSCOPY WITH PROPOFOL;  Surgeon: Jonathon Bellows, MD;  Location: [facility] ENDOSCOPY;  Service: Gastroenterology;  Laterality: N/A;   COLONOSCOPY WITH PROPOFOL N/A 06/13/2018   Procedure: COLONOSCOPY WITH PROPOFOL;  Surgeon: Jonathon Bellows, MD;  Location: [facility] ENDOSCOPY;  Service: Gastroenterology;  Laterality: N/A;   ESOPHAGOGASTRODUODENOSCOPY Left 06/10/2018   Procedure: ESOPHAGOGASTRODUODENOSCOPY (EGD);  Surgeon: Jonathon Bellows, MD;  Location: [facility] ENDOSCOPY;  Service: Gastroenterology;  Laterality: Left;   EYE SURGERY     removed cataracts   GIVENS CAPSULE STUDY N/A 06/28/2018   Procedure: GIVENS CAPSULE STUDY;  Surgeon: Lucilla Lame, MD;  Location: [facility] ENDOSCOPY;  Service: Endoscopy;  Laterality: N/A;   GIVENS CAPSULE STUDY N/A 06/30/2018   Procedure: GIVENS CAPSULE STUDY;  Surgeon: Jonathon Bellows, MD;  Location: [facility] ENDOSCOPY;  Service: Gastroenterology;  Laterality: N/A;   KYPHOPLASTY N/A 10/31/2018   Procedure: KYPHOPLASTY L2;  Surgeon: [name], MD;  Location: [facility] ORS;  Service: Orthopedics;  Laterality: N/A;   KYPHOPLASTY N/A 12/24/2018   Procedure: KYPHOPLASTY L3;  Surgeon: [name], MD;  Location: [facility] ORS;  Service: Orthopedics;  Laterality: N/A;   KYPHOPLASTY N/A 02/18/2019   Procedure: L2 KYPHOPLASTY;  Surgeon: [name], MD;  Location: [facility] ORS;  Service: Orthopedics;  Laterality: N/A;   RADIOLOGY WITH ANESTHESIA N/A 01/28/2019   Procedure: MRI LUMBER SPINE WITHOUT CONTRAST;  Surgeon: Radiologist, Medication, MD;  Location: [facility];  Service: Radiology;  Laterality: N/A;    SOCIAL HISTORY: Social History   Socioeconomic History   Marital  status: Widowed    Spouse name: Not on file   Number of children: Not on file   Years of education: Not on file   Highest education level: Not on file  Occupational History   Not on file  Tobacco Use   Smoking status: Former    Packs/day: 2.00    Years: 35.00    Total pack years: 70.00    Types: Cigarettes    Quit date: 11/04/1982    Years since quitting: 38.9   Smokeless tobacco: Current    Types: Chew  Vaping Use   Vaping Use: Never used  Substance and Sexual Activity   Alcohol use: Not Currently   Drug use: No   Sexual activity: Not Currently  Other Topics Concern   Not on file  Social History Narrative   Not on file   Social Determinants of Health   Financial Resource Strain: Low Risk  (07/12/2018)   Overall Financial Resource Strain (CARDIA)    Difficulty of Paying Living Expenses: Not hard at all  Food Insecurity: No Food Insecurity (07/12/2018)   Hunger Vital Sign  Worried About Charity fundraiser in the Last Year: Never true    [place] in the Last Year: Never true  Transportation Needs: No Transportation Needs (06/27/2018)   PRAPARE - Hydrologist (Medical): No    Lack of Transportation (Non-Medical): No  Physical Activity: Unknown (06/27/2018)   Exercise Vital Sign    Days of Exercise per Week: 0 days    Minutes of Exercise per Session: Not on file  Stress: No Stress Concern Present (06/27/2018)   [place]    Feeling of Stress : Not at all  Social Connections: Not on file  Intimate Partner Violence: Unknown (06/27/2018)   Humiliation, Afraid, Rape, and Kick questionnaire    Fear of Current or Ex-Partner: Patient refused    Emotionally Abused: Patient refused    Physically Abused: Patient refused    Sexually Abused: Patient refused    FAMILY HISTORY: Family History  Problem Relation Age of Onset   COPD Mother    Cancer Father     ALLERGIES:  is  allergic to codeine.  MEDICATIONS:  Current Outpatient Medications  Medication Sig Dispense Refill   acetaminophen (TYLENOL) 325 MG tablet Take 2 tablets (650 mg total) by mouth every 6 (six) hours as needed for mild pain (or Fever >/= 101).     albuterol (VENTOLIN HFA) 108 (90 Base) MCG/ACT inhaler Inhale 2 puffs into the lungs every 6 (six) hours as needed for wheezing or shortness of breath. 1 Inhaler 2   amLODipine (NORVASC) 2.5 MG tablet Take 2.5 mg by mouth daily.     insulin aspart (NOVOLOG) 100 UNIT/ML injection 4 units prior to meals if sugars above 200 10 mL 11   LEVEMIR 100 UNIT/ML injection Inject 0.04 mLs (4 Units total) into the skin at bedtime. 10 mL 0   pravastatin (PRAVACHOL) 10 MG tablet Take 10 mg by mouth at bedtime.     promethazine (PHENERGAN) 25 MG tablet Take 25 mg by mouth every 4 (four) hours as needed for nausea or vomiting.     spironolactone (ALDACTONE) 25 MG tablet Take 25 mg by mouth daily.     tamsulosin (FLOMAX) 0.4 MG CAPS capsule Take 0.4 mg by mouth daily.     tiotropium (SPIRIVA) 18 MCG inhalation capsule Place 18 mcg into inhaler and inhale daily.     torsemide (DEMADEX) 20 MG tablet Take 20 mg by mouth every Monday, Wednesday, and Friday.     triamcinolone cream (KENALOG) 0.5 %      TRUE METRIX BLOOD GLUCOSE TEST test strip 1 each by Other route every morning.     ascorbic acid (VITAMIN C) 500 MG tablet Take 500 mg by mouth at bedtime. (Patient not taking: Reported on 06/29/2021)     docusate sodium (COLACE) 100 MG capsule Take 1 capsule (100 mg total) by mouth 2 (two) times daily. (Patient not taking: Reported on 09/29/2020) 60 capsule 0   metoCLOPramide (REGLAN) 5 MG tablet Take 5 mg by mouth 4 (four) times daily -  before meals and at bedtime. (Patient not taking: Reported on 03/31/2020)     pantoprazole (PROTONIX) 40 MG tablet Take 1 tablet (40 mg total) by mouth daily. (Patient not taking: Reported on 06/29/2021)     polyethylene glycol powder  (GLYCOLAX/MIRALAX) 17 GM/SCOOP powder Take 17 g by mouth every other day.  (Patient not taking: Reported on 03/31/2020)     potassium chloride (KLOR-CON) 10  MEQ tablet Take 10 mEq by mouth every evening. (Patient not taking: Reported on 06/29/2021)     No current facility-administered medications for this visit.   Facility-Administered Medications Ordered in Other Visits  Medication Dose Route Frequency Provider Last Rate Last Admin   0.9 %  sodium chloride infusion   Intravenous Continuous Earlie Server, MD 10 mL/hr at 10/15/20 1452 New Bag at 10/15/20 1452     PHYSICAL EXAMINATION: ECOG PERFORMANCE STATUS: 3 - Symptomatic, >50% confined to bed Vitals:   09/21/21 1448  BP: (!) 155/54  Pulse: 82  Temp: (!) 97.2 F (36.2 C)  SpO2: 95%   Filed Weights   09/21/21 1448  Weight: 173 lb 3.2 oz (78.6 kg)     Physical Exam Constitutional:      General: He is not in acute distress.    Appearance: He is ill-appearing.     Comments: Sits in wheel chair  HENT:     Head: Normocephalic and atraumatic.  Eyes:     General: No scleral icterus.    Pupils: Pupils are equal, round, and reactive to light.  Cardiovascular:     Rate and Rhythm: Normal rate.  Pulmonary:     Effort: Pulmonary effort is normal. No respiratory distress.     Breath sounds: No wheezing.     Comments:  Decreased breath sounds at right base Abdominal:     General: There is no distension.     Palpations: Abdomen is soft.  Musculoskeletal:        General: Normal range of motion.     Cervical back: Normal range of motion and neck supple.     Comments: Chronic bilateral lower extremity edema.    Skin:    General: Skin is warm and dry.     Coloration: Skin is not pale.     Findings: No rash.  Neurological:     Mental Status: He is alert. Mental status is at baseline.  Psychiatric:        Mood and Affect: Mood normal.     LABORATORY DATA:  I have reviewed the data as listed    Latest Ref Rng & Units 09/21/2021     2:26 PM 09/07/2021    3:23 PM 08/24/2021    3:27 PM  CBC  WBC 4.0 - 10.5 K/uL 5.6     Hemoglobin 13.0 - 17.0 g/dL 8.1  8.1  8.1   Hematocrit 39.0 - 52.0 % 26.9  27.4  27.5   Platelets 150 - 400 K/uL 182         Latest Ref Rng & Units 09/21/2021    2:26 PM 12/22/2020    2:10 PM 02/27/2020    8:07 AM  CMP  Glucose 70 - 99 mg/dL 219  78  225   BUN 8 - 23 mg/dL 69  50  46   Creatinine 0.61 - 1.24 mg/dL 2.75  2.01  2.05   Sodium 135 - 145 mmol/L 131  135  134   Potassium 3.5 - 5.1 mmol/L 5.7  4.2  4.1   Chloride 98 - 111 mmol/L 106  102  101   CO2 22 - 32 mmol/L _0 Calcium 8.9 - 10.3 mg/dL 7.7  8.3  8.2   Total Protein 6.5 - 8.1 g/dL 7.0  7.1  7.2   Total Bilirubin 0.3 - 1.2 mg/dL 0.4  0.3  0.8   Alkaline Phos 38 - 126 U/L 66  68  61  AST 15 - 41 U/L _0 ALT 0 - 44 U/L _1 Lab Results  Component Value Date   IRON 93 09/21/2021   TIBC 172 (L) 09/21/2021   FERRITIN 913 (H) 09/21/2021        RADIOGRAPHIC STUDIES: I have personally reviewed the radiological images as listed and agreed with the findings in the report. No results found.

## 2021-09-21 NOTE — Assessment & Plan Note (Addendum)
Patient's iron panel is not reliable due to chronic inflammation. Patient has persistently decreased reticulocyte hemoglobin, indicating underlying iron deficiency. Ferritin is chronically elevated.  negaive hemochromatosis mutation  Check soluble transferrin receptor previous IV Venofer treatment has helped stabilizing hemoglobin.

## 2021-09-30 DIAGNOSIS — N289 Disorder of kidney and ureter, unspecified: Secondary | ICD-10-CM | POA: Diagnosis not present

## 2021-10-05 ENCOUNTER — Inpatient Hospital Stay: Payer: Medicare HMO

## 2021-10-05 ENCOUNTER — Inpatient Hospital Stay: Payer: Medicare HMO | Attending: Oncology

## 2021-10-05 VITALS — BP 133/56 | HR 71

## 2021-10-05 DIAGNOSIS — N184 Chronic kidney disease, stage 4 (severe): Secondary | ICD-10-CM | POA: Insufficient documentation

## 2021-10-05 DIAGNOSIS — I129 Hypertensive chronic kidney disease with stage 1 through stage 4 chronic kidney disease, or unspecified chronic kidney disease: Secondary | ICD-10-CM | POA: Insufficient documentation

## 2021-10-05 DIAGNOSIS — R7989 Other specified abnormal findings of blood chemistry: Secondary | ICD-10-CM

## 2021-10-05 DIAGNOSIS — D631 Anemia in chronic kidney disease: Secondary | ICD-10-CM | POA: Diagnosis not present

## 2021-10-05 DIAGNOSIS — Z79899 Other long term (current) drug therapy: Secondary | ICD-10-CM | POA: Diagnosis not present

## 2021-10-05 LAB — HEMOGLOBIN AND HEMATOCRIT, BLOOD
HCT: 26.9 % — ABNORMAL LOW (ref 39.0–52.0)
Hemoglobin: 8.1 g/dL — ABNORMAL LOW (ref 13.0–17.0)

## 2021-10-05 LAB — SEDIMENTATION RATE: Sed Rate: 61 mm/hr — ABNORMAL HIGH (ref 0–20)

## 2021-10-05 LAB — C-REACTIVE PROTEIN: CRP: 0.6 mg/dL (ref <1.0)

## 2021-10-05 MED ORDER — EPOETIN ALFA-EPBX 20000 UNIT/ML IJ SOLN
20000.0000 [IU] | Freq: Once | INTRAMUSCULAR | Status: AC
  Administered 2021-10-05: 20000 [IU] via SUBCUTANEOUS
  Filled 2021-10-05: qty 1

## 2021-10-05 MED ORDER — EPOETIN ALFA-EPBX 40000 UNIT/ML IJ SOLN
40000.0000 [IU] | Freq: Once | INTRAMUSCULAR | Status: AC
  Administered 2021-10-05: 40000 [IU] via SUBCUTANEOUS
  Filled 2021-10-05: qty 1

## 2021-10-05 MED ORDER — EPOETIN ALFA-EPBX 20000 UNIT/ML IJ SOLN: 60000.0000 [IU] | Freq: Once | INTRAMUSCULAR | Status: DC

## 2021-10-06 LAB — MISC LABCORP TEST (SEND OUT): Labcorp test code: 143305

## 2021-10-06 LAB — ANTINUCLEAR ANTIBODIES, IFA: ANA Ab, IFA: NEGATIVE

## 2021-10-19 ENCOUNTER — Inpatient Hospital Stay: Payer: Medicare HMO

## 2021-10-19 VITALS — BP 139/57 | HR 84

## 2021-10-19 DIAGNOSIS — I129 Hypertensive chronic kidney disease with stage 1 through stage 4 chronic kidney disease, or unspecified chronic kidney disease: Secondary | ICD-10-CM | POA: Diagnosis not present

## 2021-10-19 DIAGNOSIS — D631 Anemia in chronic kidney disease: Secondary | ICD-10-CM | POA: Diagnosis not present

## 2021-10-19 DIAGNOSIS — Z79899 Other long term (current) drug therapy: Secondary | ICD-10-CM | POA: Diagnosis not present

## 2021-10-19 DIAGNOSIS — N184 Chronic kidney disease, stage 4 (severe): Secondary | ICD-10-CM | POA: Diagnosis not present

## 2021-10-19 LAB — HEMOGLOBIN AND HEMATOCRIT, BLOOD
HCT: 28.7 % — ABNORMAL LOW (ref 39.0–52.0)
Hemoglobin: 8.2 g/dL — ABNORMAL LOW (ref 13.0–17.0)

## 2021-10-19 MED ORDER — EPOETIN ALFA-EPBX 20000 UNIT/ML IJ SOLN: 60000.0000 [IU] | Freq: Once | INTRAMUSCULAR | Status: DC

## 2021-10-19 MED ORDER — EPOETIN ALFA-EPBX 40000 UNIT/ML IJ SOLN
40000.0000 [IU] | Freq: Once | INTRAMUSCULAR | Status: AC
  Administered 2021-10-19: 40000 [IU] via SUBCUTANEOUS
  Filled 2021-10-19: qty 1

## 2021-10-19 MED ORDER — EPOETIN ALFA-EPBX 20000 UNIT/ML IJ SOLN
20000.0000 [IU] | Freq: Once | INTRAMUSCULAR | Status: AC
  Administered 2021-10-19: 20000 [IU] via SUBCUTANEOUS
  Filled 2021-10-19: qty 1

## 2021-10-27 DIAGNOSIS — J449 Chronic obstructive pulmonary disease, unspecified: Secondary | ICD-10-CM | POA: Diagnosis not present

## 2021-11-02 ENCOUNTER — Inpatient Hospital Stay: Payer: Medicare HMO

## 2021-11-02 ENCOUNTER — Inpatient Hospital Stay: Payer: Medicare HMO | Attending: Oncology

## 2021-11-02 VITALS — BP 150/82 | HR 67 | Resp 20

## 2021-11-02 DIAGNOSIS — D631 Anemia in chronic kidney disease: Secondary | ICD-10-CM | POA: Insufficient documentation

## 2021-11-02 DIAGNOSIS — I129 Hypertensive chronic kidney disease with stage 1 through stage 4 chronic kidney disease, or unspecified chronic kidney disease: Secondary | ICD-10-CM | POA: Insufficient documentation

## 2021-11-02 DIAGNOSIS — N184 Chronic kidney disease, stage 4 (severe): Secondary | ICD-10-CM | POA: Diagnosis not present

## 2021-11-02 DIAGNOSIS — Z79899 Other long term (current) drug therapy: Secondary | ICD-10-CM | POA: Diagnosis not present

## 2021-11-02 LAB — HEMOGLOBIN AND HEMATOCRIT, BLOOD
HCT: 28.2 % — ABNORMAL LOW (ref 39.0–52.0)
Hemoglobin: 8.2 g/dL — ABNORMAL LOW (ref 13.0–17.0)

## 2021-11-02 MED ORDER — EPOETIN ALFA-EPBX 20000 UNIT/ML IJ SOLN
20000.0000 [IU] | Freq: Once | INTRAMUSCULAR | Status: AC
  Administered 2021-11-02: 20000 [IU] via SUBCUTANEOUS
  Filled 2021-11-02: qty 1

## 2021-11-02 MED ORDER — EPOETIN ALFA-EPBX 40000 UNIT/ML IJ SOLN
40000.0000 [IU] | Freq: Once | INTRAMUSCULAR | Status: AC
  Administered 2021-11-02: 40000 [IU] via SUBCUTANEOUS
  Filled 2021-11-02: qty 1

## 2021-11-02 MED ORDER — EPOETIN ALFA-EPBX 20000 UNIT/ML IJ SOLN: 60000.0000 [IU] | Freq: Once | INTRAMUSCULAR | Status: DC

## 2021-11-09 DIAGNOSIS — E119 Type 2 diabetes mellitus without complications: Secondary | ICD-10-CM | POA: Diagnosis not present

## 2021-11-09 DIAGNOSIS — Z794 Long term (current) use of insulin: Secondary | ICD-10-CM | POA: Diagnosis not present

## 2021-11-16 ENCOUNTER — Inpatient Hospital Stay: Payer: Medicare HMO

## 2021-11-16 VITALS — BP 145/63 | HR 76 | Resp 20

## 2021-11-16 DIAGNOSIS — Z23 Encounter for immunization: Secondary | ICD-10-CM | POA: Diagnosis not present

## 2021-11-16 DIAGNOSIS — E1122 Type 2 diabetes mellitus with diabetic chronic kidney disease: Secondary | ICD-10-CM | POA: Diagnosis not present

## 2021-11-16 DIAGNOSIS — N189 Chronic kidney disease, unspecified: Secondary | ICD-10-CM

## 2021-11-16 DIAGNOSIS — J431 Panlobular emphysema: Secondary | ICD-10-CM | POA: Diagnosis not present

## 2021-11-16 DIAGNOSIS — Z79899 Other long term (current) drug therapy: Secondary | ICD-10-CM | POA: Diagnosis not present

## 2021-11-16 DIAGNOSIS — I129 Hypertensive chronic kidney disease with stage 1 through stage 4 chronic kidney disease, or unspecified chronic kidney disease: Secondary | ICD-10-CM | POA: Diagnosis not present

## 2021-11-16 DIAGNOSIS — N1832 Chronic kidney disease, stage 3b: Secondary | ICD-10-CM | POA: Diagnosis not present

## 2021-11-16 DIAGNOSIS — Z794 Long term (current) use of insulin: Secondary | ICD-10-CM | POA: Diagnosis not present

## 2021-11-16 DIAGNOSIS — N184 Chronic kidney disease, stage 4 (severe): Secondary | ICD-10-CM | POA: Diagnosis not present

## 2021-11-16 DIAGNOSIS — Z87891 Personal history of nicotine dependence: Secondary | ICD-10-CM | POA: Diagnosis not present

## 2021-11-16 DIAGNOSIS — D631 Anemia in chronic kidney disease: Secondary | ICD-10-CM | POA: Diagnosis not present

## 2021-11-16 LAB — HEMOGLOBIN AND HEMATOCRIT, BLOOD
HCT: 28.6 % — ABNORMAL LOW (ref 39.0–52.0)
Hemoglobin: 8.4 g/dL — ABNORMAL LOW (ref 13.0–17.0)

## 2021-11-16 MED ORDER — EPOETIN ALFA-EPBX 40000 UNIT/ML IJ SOLN
40000.0000 [IU] | Freq: Once | INTRAMUSCULAR | Status: AC
  Administered 2021-11-16: 40000 [IU] via SUBCUTANEOUS
  Filled 2021-11-16: qty 1

## 2021-11-16 MED ORDER — EPOETIN ALFA-EPBX 20000 UNIT/ML IJ SOLN: 60000.0000 [IU] | Freq: Once | INTRAMUSCULAR | Status: DC

## 2021-11-16 MED ORDER — EPOETIN ALFA-EPBX 20000 UNIT/ML IJ SOLN
20000.0000 [IU] | Freq: Once | INTRAMUSCULAR | Status: AC
  Administered 2021-11-16: 20000 [IU] via SUBCUTANEOUS
  Filled 2021-11-16: qty 1

## 2021-11-28 ENCOUNTER — Ambulatory Visit: Payer: Medicare HMO | Admitting: Dermatology

## 2021-11-28 DIAGNOSIS — L570 Actinic keratosis: Secondary | ICD-10-CM | POA: Diagnosis not present

## 2021-11-28 DIAGNOSIS — Z85828 Personal history of other malignant neoplasm of skin: Secondary | ICD-10-CM

## 2021-11-28 DIAGNOSIS — L578 Other skin changes due to chronic exposure to nonionizing radiation: Secondary | ICD-10-CM

## 2021-11-28 NOTE — Progress Notes (Signed)
   Follow-Up Visit   Subjective  Arthur [name] is a 88 y.o. male who presents for the following: AK f/u (Face, ears, 59mf/u), ISK f/u (L post auricular neck, 629m/u), and check spots (Nose,no symptoms/L temple, tender).  Patient accompanied by daughter who contributes to history.  The following portions of the chart were reviewed this encounter and updated as appropriate:       Review of Systems:  No other skin or systemic complaints except as noted in HPI or Assessment and Plan.  Objective  Well appearing patient in no apparent distress; mood and affect are within normal limits.  A focused examination was performed including face, ears, neck. Relevant physical exam findings are noted in the Assessment and Plan.  nasal dorsum x 1, L temple x 4, L ear helix x 1, R mid cheek x 1, L lat forehead x 1 (8) Pink scaly macules    Assessment & Plan   Actinic Damage - chronic, secondary to cumulative UV radiation exposure/sun exposure over time - diffuse scaly erythematous macules with underlying dyspigmentation - Recommend daily broad spectrum sunscreen SPF 30+ to sun-exposed areas, reapply every 2 hours as needed.  - Recommend staying in the shade or wearing long sleeves, sun glasses (UVA+UVB protection) and wide brim hats (4-inch brim around the entire circumference of the hat). - Call for new or changing lesions.  - face  History of Squamous Cell Carcinoma of the Skin - No evidence of recurrence today - Recommend regular full body skin exams - Recommend daily broad spectrum sunscreen SPF 30+ to sun-exposed areas, reapply every 2 hours as needed.  - Call if any new or changing lesions are noted between office visits -R lat cheek, R upper nasal dorsum   AK (actinic keratosis) (8) nasal dorsum x 1, L temple x 4, L ear helix x 1, R mid cheek x 1, L lat forehead x 1  Destruction of lesion - nasal dorsum x 1, L temple x 4, L ear helix x 1, R mid cheek x 1, L lat forehead x  1  Destruction method: cryotherapy   Informed consent: discussed and consent obtained   Lesion destroyed using liquid nitrogen: Yes   Region frozen until ice ball extended beyond lesion: Yes   Outcome: patient tolerated procedure well with no complications   Post-procedure details: wound care instructions given   Additional details:  Prior to procedure, discussed risks of blister formation, small wound, skin dyspigmentation, or rare scar following cryotherapy. Recommend Vaseline ointment to treated areas while healing.    Return in about 6 months (around 05/29/2022) for AK f/u.  I, SoOthelia PullingRMA, am acting as scribe for TaBrendolyn PattyMD .  Documentation: I have reviewed the above documentation for accuracy and completeness, and I agree with the above.  TaBrendolyn PattyD

## 2021-11-28 NOTE — Patient Instructions (Addendum)
Cryotherapy Aftercare  Wash gently with soap and water everyday.   Apply Vaseline and Band-Aid daily until healed.     Due to recent changes in healthcare laws, you may see results of your pathology and/or laboratory studies on MyChart before the doctors have had a chance to review them. We understand that in some cases there may be results that are confusing or concerning to you. Please understand that not all results are received at the same time and often the doctors may need to interpret multiple results in order to provide you with the best plan of care or course of treatment. Therefore, we ask that you please give us 2 business days to thoroughly review all your results before contacting the office for clarification. Should we see a critical lab result, you will be contacted sooner.   If You Need Anything After Your Visit  If you have any questions or concerns for your doctor, please call our main line at [phone] and press option 4 to reach your doctor's medical assistant. If no one answers, please leave a voicemail as directed and we will return your call as soon as possible. Messages left after 4 pm will be answered the following business day.   You may also send us a message via MyChart. We typically respond to MyChart messages within 1-2 business days.  For prescription refills, please ask your pharmacy to contact our office. Our fax number is [phone].  If you have an urgent issue when the clinic is closed that cannot wait until the next business day, you can page your doctor at the number below.    Please note that while we do our best to be available for urgent issues outside of office hours, we are not available 24/7.   If you have an urgent issue and are unable to reach us, you may choose to seek medical care at your doctor's office, retail clinic, urgent care center, or emergency room.  If you have a medical emergency, please immediately call 911 or go to the  emergency department.  Pager Numbers  - Dr. Kowalski: [phone]  - Dr. Moye: [phone]  - Dr. Stewart: [phone]  In the event of inclement weather, please call our main line at [phone] for an update on the status of any delays or closures.  Dermatology Medication Tips: Please keep the boxes that topical medications come in in order to help keep track of the instructions about where and how to use these. Pharmacies typically print the medication instructions only on the boxes and not directly on the medication tubes.   If your medication is too expensive, please contact our office at [phone] option 4 or send us a message through MyChart.   We are unable to tell what your co-pay for medications will be in advance as this is different depending on your insurance coverage. However, we may be able to find a substitute medication at lower cost or fill out paperwork to get insurance to cover a needed medication.   If a prior authorization is required to get your medication covered by your insurance company, please allow us 1-2 business days to complete this process.  Drug prices often vary depending on where the prescription is filled and some pharmacies may offer cheaper prices.  The website www.goodrx.com contains coupons for medications through different pharmacies. The prices here do not account for what the cost may be with help from insurance (it may be cheaper with your insurance), but the website can   give you the price if you did not use any insurance.  - You can print the associated coupon and take it with your prescription to the pharmacy.  - You may also stop by our office during regular business hours and pick up a GoodRx coupon card.  - If you need your prescription sent electronically to a different pharmacy, notify our office through [facility] MyChart or by phone at [phone] option 4.     Si Usted Necesita Algo Despus de Su Visita  Tambin puede  enviarnos un mensaje a travs de MyChart. Por lo general respondemos a los mensajes de MyChart en el transcurso de 1 a 2 das hbiles.  Para renovar recetas, por favor pida a su farmacia que se ponga en contacto con nuestra oficina. Nuestro nmero de fax es el [phone].  Si tiene un asunto urgente cuando la clnica est cerrada y que no puede esperar hasta el siguiente da hbil, puede llamar/localizar a su doctor(a) al nmero que aparece a continuacin.   Por favor, tenga en cuenta que aunque hacemos todo lo posible para estar disponibles para asuntos urgentes fuera del horario de oficina, no estamos disponibles las 24 horas del da, los 7 das de la semana.   Si tiene un problema urgente y no puede comunicarse con nosotros, puede optar por buscar atencin mdica  en el consultorio de su doctor(a), en una clnica privada, en un centro de atencin urgente o en una sala de emergencias.  Si tiene una emergencia mdica, por favor llame inmediatamente al 911 o vaya a la sala de emergencias.  Nmeros de bper  - Dr. Kowalski: [phone]  - Dra. Moye: [phone]  - Dra. Stewart: [phone]  En caso de inclemencias del tiempo, por favor llame a nuestra lnea principal al [phone] para una actualizacin sobre el estado de cualquier retraso o cierre.  Consejos para la medicacin en dermatologa: Por favor, guarde las cajas en las que vienen los medicamentos de uso tpico para ayudarle a seguir las instrucciones sobre dnde y cmo usarlos. Las farmacias generalmente imprimen las instrucciones del medicamento slo en las cajas y no directamente en los tubos del medicamento.   Si su medicamento es muy caro, por favor, pngase en contacto con nuestra oficina llamando al [phone] y presione la opcin 4 o envenos un mensaje a travs de MyChart.   No podemos decirle cul ser su copago por los medicamentos por adelantado ya que esto es diferente dependiendo de la cobertura de su seguro.  Sin embargo, es posible que podamos encontrar un medicamento sustituto a menor costo o llenar un formulario para que el seguro cubra el medicamento que se considera necesario.   Si se requiere una autorizacin previa para que su compaa de seguros cubra su medicamento, por favor permtanos de 1 a 2 das hbiles para completar este proceso.  Los precios de los medicamentos varan con frecuencia dependiendo del lugar de dnde se surte la receta y alguna farmacias pueden ofrecer precios ms baratos.  El sitio web www.goodrx.com tiene cupones para medicamentos de diferentes farmacias. Los precios aqu no tienen en cuenta lo que podra costar con la ayuda del seguro (puede ser ms barato con su seguro), pero el sitio web puede darle el precio si no utiliz ningn seguro.  - Puede imprimir el cupn correspondiente y llevarlo con su receta a la farmacia.  - Tambin puede pasar por nuestra oficina durante el horario de atencin regular y recoger una tarjeta de cupones de GoodRx.  -   Si necesita que su receta se enve electrnicamente a una farmacia diferente, informe a nuestra oficina a travs de MyChart de [place] o por telfono llamando al [phone] y presione la opcin 4.  

## 2021-11-30 ENCOUNTER — Inpatient Hospital Stay: Payer: Medicare HMO

## 2021-11-30 DIAGNOSIS — D631 Anemia in chronic kidney disease: Secondary | ICD-10-CM

## 2021-11-30 DIAGNOSIS — I129 Hypertensive chronic kidney disease with stage 1 through stage 4 chronic kidney disease, or unspecified chronic kidney disease: Secondary | ICD-10-CM | POA: Diagnosis not present

## 2021-11-30 DIAGNOSIS — Z79899 Other long term (current) drug therapy: Secondary | ICD-10-CM | POA: Diagnosis not present

## 2021-11-30 DIAGNOSIS — B351 Tinea unguium: Secondary | ICD-10-CM | POA: Diagnosis not present

## 2021-11-30 DIAGNOSIS — E1142 Type 2 diabetes mellitus with diabetic polyneuropathy: Secondary | ICD-10-CM | POA: Diagnosis not present

## 2021-11-30 DIAGNOSIS — N184 Chronic kidney disease, stage 4 (severe): Secondary | ICD-10-CM | POA: Diagnosis not present

## 2021-11-30 LAB — HEMOGLOBIN AND HEMATOCRIT, BLOOD
HCT: 28.5 % — ABNORMAL LOW (ref 39.0–52.0)
Hemoglobin: 8.3 g/dL — ABNORMAL LOW (ref 13.0–17.0)

## 2021-11-30 MED ORDER — EPOETIN ALFA-EPBX 20000 UNIT/ML IJ SOLN: 60000.0000 [IU] | Freq: Once | INTRAMUSCULAR | Status: DC

## 2021-11-30 MED ORDER — EPOETIN ALFA-EPBX 10000 UNIT/ML IJ SOLN
20000.0000 [IU] | Freq: Once | INTRAMUSCULAR | Status: AC
  Administered 2021-11-30: 20000 [IU] via SUBCUTANEOUS
  Filled 2021-11-30: qty 2

## 2021-11-30 MED ORDER — EPOETIN ALFA-EPBX 40000 UNIT/ML IJ SOLN
40000.0000 [IU] | Freq: Once | INTRAMUSCULAR | Status: AC
  Administered 2021-11-30: 40000 [IU] via SUBCUTANEOUS
  Filled 2021-11-30: qty 1

## 2021-12-11 ENCOUNTER — Other Ambulatory Visit: Payer: Self-pay

## 2021-12-11 DIAGNOSIS — D696 Thrombocytopenia, unspecified: Secondary | ICD-10-CM | POA: Diagnosis present

## 2021-12-11 DIAGNOSIS — M549 Dorsalgia, unspecified: Secondary | ICD-10-CM | POA: Diagnosis not present

## 2021-12-11 DIAGNOSIS — Z86008 Personal history of in-situ neoplasm of other site: Secondary | ICD-10-CM

## 2021-12-11 DIAGNOSIS — D631 Anemia in chronic kidney disease: Secondary | ICD-10-CM | POA: Diagnosis present

## 2021-12-11 DIAGNOSIS — I959 Hypotension, unspecified: Secondary | ICD-10-CM | POA: Diagnosis not present

## 2021-12-11 DIAGNOSIS — E11649 Type 2 diabetes mellitus with hypoglycemia without coma: Secondary | ICD-10-CM | POA: Diagnosis not present

## 2021-12-11 DIAGNOSIS — N184 Chronic kidney disease, stage 4 (severe): Secondary | ICD-10-CM | POA: Diagnosis present

## 2021-12-11 DIAGNOSIS — Z85828 Personal history of other malignant neoplasm of skin: Secondary | ICD-10-CM

## 2021-12-11 DIAGNOSIS — J439 Emphysema, unspecified: Secondary | ICD-10-CM | POA: Diagnosis not present

## 2021-12-11 DIAGNOSIS — E875 Hyperkalemia: Secondary | ICD-10-CM | POA: Diagnosis not present

## 2021-12-11 DIAGNOSIS — J1282 Pneumonia due to coronavirus disease 2019: Secondary | ICD-10-CM | POA: Diagnosis present

## 2021-12-11 DIAGNOSIS — J441 Chronic obstructive pulmonary disease with (acute) exacerbation: Secondary | ICD-10-CM | POA: Diagnosis not present

## 2021-12-11 DIAGNOSIS — E785 Hyperlipidemia, unspecified: Secondary | ICD-10-CM | POA: Diagnosis present

## 2021-12-11 DIAGNOSIS — Z885 Allergy status to narcotic agent status: Secondary | ICD-10-CM

## 2021-12-11 DIAGNOSIS — Z0389 Encounter for observation for other suspected diseases and conditions ruled out: Secondary | ICD-10-CM | POA: Diagnosis not present

## 2021-12-11 DIAGNOSIS — M47816 Spondylosis without myelopathy or radiculopathy, lumbar region: Secondary | ICD-10-CM | POA: Diagnosis present

## 2021-12-11 DIAGNOSIS — E669 Obesity, unspecified: Secondary | ICD-10-CM | POA: Diagnosis present

## 2021-12-11 DIAGNOSIS — R54 Age-related physical debility: Secondary | ICD-10-CM | POA: Diagnosis present

## 2021-12-11 DIAGNOSIS — Z743 Need for continuous supervision: Secondary | ICD-10-CM | POA: Diagnosis not present

## 2021-12-11 DIAGNOSIS — G4733 Obstructive sleep apnea (adult) (pediatric): Secondary | ICD-10-CM | POA: Diagnosis present

## 2021-12-11 DIAGNOSIS — I130 Hypertensive heart and chronic kidney disease with heart failure and stage 1 through stage 4 chronic kidney disease, or unspecified chronic kidney disease: Secondary | ICD-10-CM | POA: Diagnosis present

## 2021-12-11 DIAGNOSIS — I2510 Atherosclerotic heart disease of native coronary artery without angina pectoris: Secondary | ICD-10-CM | POA: Diagnosis present

## 2021-12-11 DIAGNOSIS — I700 Atherosclerosis of aorta: Secondary | ICD-10-CM | POA: Diagnosis not present

## 2021-12-11 DIAGNOSIS — Z8709 Personal history of other diseases of the respiratory system: Secondary | ICD-10-CM | POA: Diagnosis not present

## 2021-12-11 DIAGNOSIS — R0902 Hypoxemia: Secondary | ICD-10-CM | POA: Diagnosis not present

## 2021-12-11 DIAGNOSIS — Z87891 Personal history of nicotine dependence: Secondary | ICD-10-CM | POA: Diagnosis not present

## 2021-12-11 DIAGNOSIS — J449 Chronic obstructive pulmonary disease, unspecified: Secondary | ICD-10-CM | POA: Diagnosis not present

## 2021-12-11 DIAGNOSIS — J690 Pneumonitis due to inhalation of food and vomit: Secondary | ICD-10-CM | POA: Diagnosis not present

## 2021-12-11 DIAGNOSIS — Z794 Long term (current) use of insulin: Secondary | ICD-10-CM | POA: Diagnosis not present

## 2021-12-11 DIAGNOSIS — Z8616 Personal history of COVID-19: Secondary | ICD-10-CM | POA: Diagnosis not present

## 2021-12-11 DIAGNOSIS — J189 Pneumonia, unspecified organism: Secondary | ICD-10-CM | POA: Diagnosis not present

## 2021-12-11 DIAGNOSIS — Z7189 Other specified counseling: Secondary | ICD-10-CM | POA: Diagnosis not present

## 2021-12-11 DIAGNOSIS — J432 Centrilobular emphysema: Secondary | ICD-10-CM | POA: Diagnosis present

## 2021-12-11 DIAGNOSIS — I10 Essential (primary) hypertension: Secondary | ICD-10-CM | POA: Diagnosis not present

## 2021-12-11 DIAGNOSIS — Z66 Do not resuscitate: Secondary | ICD-10-CM | POA: Diagnosis present

## 2021-12-11 DIAGNOSIS — R262 Difficulty in walking, not elsewhere classified: Secondary | ICD-10-CM | POA: Diagnosis present

## 2021-12-11 DIAGNOSIS — U071 COVID-19: Secondary | ICD-10-CM | POA: Diagnosis present

## 2021-12-11 DIAGNOSIS — J9611 Chronic respiratory failure with hypoxia: Secondary | ICD-10-CM | POA: Diagnosis not present

## 2021-12-11 DIAGNOSIS — W19XXXA Unspecified fall, initial encounter: Secondary | ICD-10-CM | POA: Diagnosis not present

## 2021-12-11 DIAGNOSIS — Z79899 Other long term (current) drug therapy: Secondary | ICD-10-CM

## 2021-12-11 DIAGNOSIS — Z515 Encounter for palliative care: Secondary | ICD-10-CM

## 2021-12-11 DIAGNOSIS — Z9981 Dependence on supplemental oxygen: Secondary | ICD-10-CM

## 2021-12-11 DIAGNOSIS — E1165 Type 2 diabetes mellitus with hyperglycemia: Secondary | ICD-10-CM | POA: Diagnosis present

## 2021-12-11 DIAGNOSIS — R112 Nausea with vomiting, unspecified: Secondary | ICD-10-CM | POA: Diagnosis not present

## 2021-12-11 DIAGNOSIS — J9620 Acute and chronic respiratory failure, unspecified whether with hypoxia or hypercapnia: Secondary | ICD-10-CM | POA: Diagnosis not present

## 2021-12-11 DIAGNOSIS — J9621 Acute and chronic respiratory failure with hypoxia: Secondary | ICD-10-CM | POA: Diagnosis present

## 2021-12-11 DIAGNOSIS — I5032 Chronic diastolic (congestive) heart failure: Secondary | ICD-10-CM | POA: Diagnosis present

## 2021-12-11 DIAGNOSIS — Z825 Family history of asthma and other chronic lower respiratory diseases: Secondary | ICD-10-CM

## 2021-12-11 DIAGNOSIS — E1122 Type 2 diabetes mellitus with diabetic chronic kidney disease: Secondary | ICD-10-CM | POA: Diagnosis present

## 2021-12-11 DIAGNOSIS — Z6828 Body mass index (BMI) 28.0-28.9, adult: Secondary | ICD-10-CM

## 2021-12-11 DIAGNOSIS — N400 Enlarged prostate without lower urinary tract symptoms: Secondary | ICD-10-CM | POA: Diagnosis present

## 2021-12-11 DIAGNOSIS — R531 Weakness: Secondary | ICD-10-CM | POA: Diagnosis not present

## 2021-12-11 DIAGNOSIS — J90 Pleural effusion, not elsewhere classified: Secondary | ICD-10-CM | POA: Diagnosis present

## 2021-12-11 LAB — BASIC METABOLIC PANEL
Anion gap: 7 (ref 5–15)
BUN: 73 mg/dL — ABNORMAL HIGH (ref 8–23)
CO2: 21 mmol/L — ABNORMAL LOW (ref 22–32)
Calcium: 7.4 mg/dL — ABNORMAL LOW (ref 8.9–10.3)
Chloride: 108 mmol/L (ref 98–111)
Creatinine, Ser: 2.88 mg/dL — ABNORMAL HIGH (ref 0.61–1.24)
GFR, Estimated: 21 mL/min — ABNORMAL LOW (ref >60)
Glucose, Bld: 131 mg/dL — ABNORMAL HIGH (ref 70–99)
Potassium: 4.8 mmol/L (ref 3.5–5.1)
Sodium: 136 mmol/L (ref 135–145)

## 2021-12-11 LAB — CBC
HCT: 27.6 % — ABNORMAL LOW (ref 39.0–52.0)
Hemoglobin: 8 g/dL — ABNORMAL LOW (ref 13.0–17.0)
MCH: 28.5 pg (ref 26.0–34.0)
MCHC: 29 g/dL — ABNORMAL LOW (ref 30.0–36.0)
MCV: 98.2 fL (ref 80.0–100.0)
Platelets: 147 10*3/uL — ABNORMAL LOW (ref 150–400)
RBC: 2.81 MIL/uL — ABNORMAL LOW (ref 4.22–5.81)
RDW: 19 % — ABNORMAL HIGH (ref 11.5–15.5)
WBC: 5.1 10*3/uL (ref 4.0–10.5)
nRBC: 0.6 % — ABNORMAL HIGH (ref 0.0–0.2)

## 2021-12-11 NOTE — ED Triage Notes (Signed)
First nurse note:  Pt to ED via EMS from home c/o gen weakness getting worse over 3 days.  Normally ambulatory but now unable.

## 2021-12-11 NOTE — ED Triage Notes (Addendum)
Pt arrived via EMS. Generalized weakness x1 wk. Hx COPD, Pt sts that he is not able to do the stuff he normally does.

## 2021-12-12 ENCOUNTER — Emergency Department: Payer: Medicare HMO

## 2021-12-12 ENCOUNTER — Telehealth: Payer: Self-pay | Admitting: Oncology

## 2021-12-12 ENCOUNTER — Encounter: Payer: Self-pay | Admitting: Internal Medicine

## 2021-12-12 ENCOUNTER — Inpatient Hospital Stay: Payer: Medicare HMO

## 2021-12-12 ENCOUNTER — Inpatient Hospital Stay
Admission: EM | Admit: 2021-12-12 | Discharge: 2021-12-23 | DRG: 177 | Disposition: A | Discharge status: Skilled Nursing Facility | Payer: Medicare HMO | Attending: Hospitalist | Admitting: Hospitalist

## 2021-12-12 DIAGNOSIS — D696 Thrombocytopenia, unspecified: Secondary | ICD-10-CM | POA: Diagnosis present

## 2021-12-12 DIAGNOSIS — U071 COVID-19: Secondary | ICD-10-CM | POA: Diagnosis not present

## 2021-12-12 DIAGNOSIS — Z515 Encounter for palliative care: Secondary | ICD-10-CM | POA: Diagnosis not present

## 2021-12-12 DIAGNOSIS — N184 Chronic kidney disease, stage 4 (severe): Secondary | ICD-10-CM | POA: Diagnosis present

## 2021-12-12 DIAGNOSIS — E669 Obesity, unspecified: Secondary | ICD-10-CM | POA: Diagnosis present

## 2021-12-12 DIAGNOSIS — E785 Hyperlipidemia, unspecified: Secondary | ICD-10-CM | POA: Diagnosis present

## 2021-12-12 DIAGNOSIS — R54 Age-related physical debility: Secondary | ICD-10-CM | POA: Diagnosis present

## 2021-12-12 DIAGNOSIS — D631 Anemia in chronic kidney disease: Secondary | ICD-10-CM | POA: Diagnosis present

## 2021-12-12 DIAGNOSIS — Z794 Long term (current) use of insulin: Secondary | ICD-10-CM | POA: Diagnosis not present

## 2021-12-12 DIAGNOSIS — I10 Essential (primary) hypertension: Secondary | ICD-10-CM | POA: Diagnosis not present

## 2021-12-12 DIAGNOSIS — J90 Pleural effusion, not elsewhere classified: Secondary | ICD-10-CM | POA: Diagnosis present

## 2021-12-12 DIAGNOSIS — Z9981 Dependence on supplemental oxygen: Secondary | ICD-10-CM | POA: Diagnosis not present

## 2021-12-12 DIAGNOSIS — J9621 Acute and chronic respiratory failure with hypoxia: Secondary | ICD-10-CM | POA: Diagnosis present

## 2021-12-12 DIAGNOSIS — E1122 Type 2 diabetes mellitus with diabetic chronic kidney disease: Secondary | ICD-10-CM | POA: Diagnosis present

## 2021-12-12 DIAGNOSIS — R531 Weakness: Principal | ICD-10-CM

## 2021-12-12 DIAGNOSIS — R262 Difficulty in walking, not elsewhere classified: Secondary | ICD-10-CM | POA: Diagnosis present

## 2021-12-12 DIAGNOSIS — J690 Pneumonitis due to inhalation of food and vomit: Secondary | ICD-10-CM | POA: Diagnosis present

## 2021-12-12 DIAGNOSIS — E119 Type 2 diabetes mellitus without complications: Secondary | ICD-10-CM

## 2021-12-12 DIAGNOSIS — E875 Hyperkalemia: Secondary | ICD-10-CM | POA: Diagnosis not present

## 2021-12-12 DIAGNOSIS — I5032 Chronic diastolic (congestive) heart failure: Secondary | ICD-10-CM | POA: Diagnosis present

## 2021-12-12 DIAGNOSIS — J441 Chronic obstructive pulmonary disease with (acute) exacerbation: Secondary | ICD-10-CM | POA: Diagnosis not present

## 2021-12-12 DIAGNOSIS — J1282 Pneumonia due to coronavirus disease 2019: Secondary | ICD-10-CM | POA: Diagnosis not present

## 2021-12-12 DIAGNOSIS — E1165 Type 2 diabetes mellitus with hyperglycemia: Secondary | ICD-10-CM | POA: Diagnosis present

## 2021-12-12 DIAGNOSIS — E11649 Type 2 diabetes mellitus with hypoglycemia without coma: Secondary | ICD-10-CM | POA: Diagnosis not present

## 2021-12-12 DIAGNOSIS — Z66 Do not resuscitate: Secondary | ICD-10-CM | POA: Diagnosis present

## 2021-12-12 DIAGNOSIS — J449 Chronic obstructive pulmonary disease, unspecified: Secondary | ICD-10-CM | POA: Diagnosis not present

## 2021-12-12 DIAGNOSIS — J9611 Chronic respiratory failure with hypoxia: Secondary | ICD-10-CM | POA: Diagnosis not present

## 2021-12-12 DIAGNOSIS — I130 Hypertensive heart and chronic kidney disease with heart failure and stage 1 through stage 4 chronic kidney disease, or unspecified chronic kidney disease: Secondary | ICD-10-CM | POA: Diagnosis present

## 2021-12-12 DIAGNOSIS — G4733 Obstructive sleep apnea (adult) (pediatric): Secondary | ICD-10-CM | POA: Diagnosis present

## 2021-12-12 DIAGNOSIS — N400 Enlarged prostate without lower urinary tract symptoms: Secondary | ICD-10-CM | POA: Diagnosis present

## 2021-12-12 DIAGNOSIS — Z7189 Other specified counseling: Secondary | ICD-10-CM | POA: Diagnosis not present

## 2021-12-12 DIAGNOSIS — J432 Centrilobular emphysema: Secondary | ICD-10-CM | POA: Diagnosis present

## 2021-12-12 HISTORY — DX: Obstructive sleep apnea (adult) (pediatric): G47.33

## 2021-12-12 LAB — URINALYSIS, ROUTINE W REFLEX MICROSCOPIC
Bacteria, UA: NONE SEEN
Bilirubin Urine: NEGATIVE
Glucose, UA: NEGATIVE mg/dL
Ketones, ur: NEGATIVE mg/dL
Leukocytes,Ua: NEGATIVE
Nitrite: NEGATIVE
Protein, ur: >=300 mg/dL — AB
Specific Gravity, Urine: 1.013 (ref 1.005–1.030)
pH: 5 (ref 5.0–8.0)

## 2021-12-12 LAB — SARS CORONAVIRUS 2 BY RT PCR: SARS Coronavirus 2 by RT PCR: POSITIVE — AB

## 2021-12-12 LAB — HEMOGLOBIN A1C
Hgb A1c MFr Bld: 6.2 % — ABNORMAL HIGH (ref 4.8–5.6)
Mean Plasma Glucose: 131 mg/dL

## 2021-12-12 LAB — CBG MONITORING, ED
Glucose-Capillary: 155 mg/dL — ABNORMAL HIGH (ref 70–99)
Glucose-Capillary: 132 mg/dL — ABNORMAL HIGH (ref 70–99)
Glucose-Capillary: 117 mg/dL — ABNORMAL HIGH (ref 70–99)
Glucose-Capillary: 166 mg/dL — ABNORMAL HIGH (ref 70–99)
Glucose-Capillary: 175 mg/dL — ABNORMAL HIGH (ref 70–99)

## 2021-12-12 LAB — TROPONIN I (HIGH SENSITIVITY)
Troponin I (High Sensitivity): 12 ng/L (ref <18)
Troponin I (High Sensitivity): 12 ng/L (ref <18)

## 2021-12-12 LAB — RESP PANEL BY RT-PCR (FLU A&B, COVID) ARPGX2
Influenza A by PCR: NEGATIVE
Influenza B by PCR: NEGATIVE
SARS Coronavirus 2 by RT PCR: POSITIVE — AB

## 2021-12-12 LAB — PROCALCITONIN: Procalcitonin: 0.8 ng/mL

## 2021-12-12 LAB — BRAIN NATRIURETIC PEPTIDE: B Natriuretic Peptide: 54.4 pg/mL (ref 0.0–100.0)

## 2021-12-12 LAB — STREP PNEUMONIAE URINARY ANTIGEN: Strep Pneumo Urinary Antigen: NEGATIVE

## 2021-12-12 MED ORDER — IPRATROPIUM-ALBUTEROL 0.5-2.5 (3) MG/3ML IN SOLN
3.0000 mL | Freq: Once | RESPIRATORY_TRACT | Status: AC
  Administered 2021-12-12: 3 mL via RESPIRATORY_TRACT
  Filled 2021-12-12: qty 3

## 2021-12-12 MED ORDER — ZINC SULFATE 220 (50 ZN) MG PO CAPS
220.0000 mg | ORAL_CAPSULE | Freq: Every day | ORAL | Status: DC
  Administered 2021-12-12 – 2021-12-23 (×11): 220 mg via ORAL
  Filled 2021-12-12 (×11): qty 1

## 2021-12-12 MED ORDER — SODIUM CHLORIDE 0.9 % IV SOLN
1.0000 g | INTRAVENOUS | Status: DC
  Administered 2021-12-12: 1 g via INTRAVENOUS
  Filled 2021-12-12 (×2): qty 10

## 2021-12-12 MED ORDER — INSULIN ASPART 100 UNIT/ML IJ SOLN
0.0000 [IU] | INTRAMUSCULAR | Status: DC
  Administered 2021-12-12: 3 [IU] via SUBCUTANEOUS
  Administered 2021-12-12: 2 [IU] via SUBCUTANEOUS
  Filled 2021-12-12 (×2): qty 1

## 2021-12-12 MED ORDER — METRONIDAZOLE 500 MG/100ML IV SOLN
500.0000 mg | Freq: Two times a day (BID) | INTRAVENOUS | Status: DC
  Administered 2021-12-12 – 2021-12-13 (×2): 500 mg via INTRAVENOUS
  Filled 2021-12-12 (×3): qty 100

## 2021-12-12 MED ORDER — IPRATROPIUM-ALBUTEROL 0.5-2.5 (3) MG/3ML IN SOLN: 3.0000 mL | Freq: Four times a day (QID) | RESPIRATORY_TRACT | Status: DC | PRN

## 2021-12-12 MED ORDER — ACETAMINOPHEN 650 MG RE SUPP: 650.0000 mg | Freq: Four times a day (QID) | RECTAL | Status: DC | PRN

## 2021-12-12 MED ORDER — AMLODIPINE BESYLATE 5 MG PO TABS
2.5000 mg | ORAL_TABLET | Freq: Every day | ORAL | Status: DC
  Administered 2021-12-12 – 2021-12-19 (×8): 2.5 mg via ORAL
  Filled 2021-12-12 (×8): qty 1

## 2021-12-12 MED ORDER — ONDANSETRON HCL 4 MG/2ML IJ SOLN
4.0000 mg | Freq: Four times a day (QID) | INTRAMUSCULAR | Status: DC | PRN
  Administered 2021-12-18 – 2021-12-19 (×4): 4 mg via INTRAVENOUS
  Filled 2021-12-12 (×5): qty 2

## 2021-12-12 MED ORDER — TIOTROPIUM BROMIDE MONOHYDRATE 18 MCG IN CAPS
18.0000 ug | ORAL_CAPSULE | Freq: Every day | RESPIRATORY_TRACT | Status: DC
  Administered 2021-12-14 – 2021-12-23 (×8): 18 ug via RESPIRATORY_TRACT
  Filled 2021-12-12: qty 5

## 2021-12-12 MED ORDER — PANTOPRAZOLE SODIUM 40 MG PO TBEC
40.0000 mg | DELAYED_RELEASE_TABLET | Freq: Every day | ORAL | Status: DC
  Administered 2021-12-12 – 2021-12-18 (×7): 40 mg via ORAL
  Filled 2021-12-12 (×7): qty 1

## 2021-12-12 MED ORDER — PREDNISONE 20 MG PO TABS
40.0000 mg | ORAL_TABLET | Freq: Every day | ORAL | Status: DC
  Administered 2021-12-12 – 2021-12-13 (×2): 40 mg via ORAL
  Filled 2021-12-12 (×2): qty 2

## 2021-12-12 MED ORDER — NIRMATRELVIR/RITONAVIR (PAXLOVID) TABLET (RENAL DOSING)
2.0000 | ORAL_TABLET | Freq: Two times a day (BID) | ORAL | Status: DC
  Administered 2021-12-12: 2 via ORAL
  Filled 2021-12-12: qty 20

## 2021-12-12 MED ORDER — PIPERACILLIN-TAZOBACTAM IN DEX 2-0.25 GM/50ML IV SOLN
2.2500 g | Freq: Three times a day (TID) | INTRAVENOUS | Status: DC
  Administered 2021-12-12: 2.25 g via INTRAVENOUS
  Filled 2021-12-12 (×2): qty 50

## 2021-12-12 MED ORDER — INSULIN ASPART 100 UNIT/ML IJ SOLN: 0.0000 [IU] | Freq: Every day | INTRAMUSCULAR | Status: DC

## 2021-12-12 MED ORDER — TORSEMIDE 20 MG PO TABS
40.0000 mg | ORAL_TABLET | Freq: Every day | ORAL | Status: DC
  Administered 2021-12-12 – 2021-12-13 (×2): 40 mg via ORAL
  Filled 2021-12-12 (×2): qty 2

## 2021-12-12 MED ORDER — VITAMIN C 500 MG PO TABS
500.0000 mg | ORAL_TABLET | Freq: Every day | ORAL | Status: DC
  Administered 2021-12-12 – 2021-12-23 (×11): 500 mg via ORAL
  Filled 2021-12-12 (×11): qty 1

## 2021-12-12 MED ORDER — PRAVASTATIN SODIUM 20 MG PO TABS
10.0000 mg | ORAL_TABLET | Freq: Every day | ORAL | Status: DC
  Administered 2021-12-17 – 2021-12-23 (×6): 10 mg via ORAL
  Filled 2021-12-12 (×6): qty 1

## 2021-12-12 MED ORDER — ACETAMINOPHEN 325 MG PO TABS: 650.0000 mg | ORAL_TABLET | Freq: Four times a day (QID) | ORAL | Status: DC | PRN

## 2021-12-12 MED ORDER — HEPARIN SODIUM (PORCINE) 5000 UNIT/ML IJ SOLN
5000.0000 [IU] | Freq: Three times a day (TID) | INTRAMUSCULAR | Status: DC
  Administered 2021-12-12 – 2021-12-23 (×34): 5000 [IU] via SUBCUTANEOUS
  Filled 2021-12-12 (×34): qty 1

## 2021-12-12 MED ORDER — HYDROCODONE-ACETAMINOPHEN 5-325 MG PO TABS
1.0000 | ORAL_TABLET | ORAL | Status: DC | PRN
  Administered 2021-12-12 – 2021-12-13 (×2): 1 via ORAL
  Administered 2021-12-13: 2 via ORAL
  Administered 2021-12-14 – 2021-12-15 (×2): 1 via ORAL
  Filled 2021-12-12 (×2): qty 1
  Filled 2021-12-12: qty 2
  Filled 2021-12-12 (×2): qty 1

## 2021-12-12 MED ORDER — PIPERACILLIN-TAZOBACTAM 3.375 G IVPB
3.3750 g | Freq: Once | INTRAVENOUS | Status: DC
  Filled 2021-12-12: qty 50

## 2021-12-12 MED ORDER — ALBUTEROL SULFATE HFA 108 (90 BASE) MCG/ACT IN AERS
2.0000 | INHALATION_SPRAY | Freq: Four times a day (QID) | RESPIRATORY_TRACT | Status: DC
  Administered 2021-12-12 – 2021-12-23 (×39): 2 via RESPIRATORY_TRACT
  Filled 2021-12-12 (×2): qty 6.7

## 2021-12-12 MED ORDER — ONDANSETRON HCL 4 MG PO TABS: 4.0000 mg | ORAL_TABLET | Freq: Four times a day (QID) | ORAL | Status: DC | PRN

## 2021-12-12 MED ORDER — TIOTROPIUM BROMIDE MONOHYDRATE 18 MCG IN CAPS: 18.0000 ug | ORAL_CAPSULE | Freq: Every day | RESPIRATORY_TRACT | Status: DC

## 2021-12-12 MED ORDER — INSULIN ASPART 100 UNIT/ML IJ SOLN
0.0000 [IU] | Freq: Three times a day (TID) | INTRAMUSCULAR | Status: DC
  Administered 2021-12-12: 2 [IU] via SUBCUTANEOUS
  Administered 2021-12-13: 1 [IU] via SUBCUTANEOUS
  Administered 2021-12-13 (×2): 2 [IU] via SUBCUTANEOUS
  Administered 2021-12-14 – 2021-12-15 (×4): 5 [IU] via SUBCUTANEOUS
  Administered 2021-12-15: 3 [IU] via SUBCUTANEOUS
  Administered 2021-12-15 – 2021-12-16 (×2): 5 [IU] via SUBCUTANEOUS
  Administered 2021-12-16: 2 [IU] via SUBCUTANEOUS
  Administered 2021-12-16 – 2021-12-17 (×4): 3 [IU] via SUBCUTANEOUS
  Administered 2021-12-18: 2 [IU] via SUBCUTANEOUS
  Administered 2021-12-18: 5 [IU] via SUBCUTANEOUS
  Administered 2021-12-18: 1 [IU] via SUBCUTANEOUS
  Administered 2021-12-19: 2 [IU] via SUBCUTANEOUS
  Administered 2021-12-19: 3 [IU] via SUBCUTANEOUS
  Administered 2021-12-20 – 2021-12-21 (×3): 2 [IU] via SUBCUTANEOUS
  Administered 2021-12-22 – 2021-12-23 (×3): 1 [IU] via SUBCUTANEOUS
  Filled 2021-12-12 (×27): qty 1

## 2021-12-12 MED ORDER — INSULIN DETEMIR 100 UNIT/ML ~~LOC~~ SOLN
4.0000 [IU] | Freq: Every day | SUBCUTANEOUS | Status: DC
  Administered 2021-12-12 – 2021-12-16 (×5): 4 [IU] via SUBCUTANEOUS
  Filled 2021-12-12 (×6): qty 0.04

## 2021-12-12 MED ORDER — TAMSULOSIN HCL 0.4 MG PO CAPS
0.4000 mg | ORAL_CAPSULE | Freq: Every evening | ORAL | Status: DC
  Administered 2021-12-12 – 2021-12-22 (×9): 0.4 mg via ORAL
  Filled 2021-12-12 (×10): qty 1

## 2021-12-12 NOTE — Evaluation (Signed)
Physical Therapy Evaluation Patient Details Name: Arthur [name] MRN: [id number] DOB: 01-01-1934 Today's Date: 12/12/2021  History of Present Illness  Pt is an 88 y.o. male presenting to hospital 12/10 with c/o weakness and SOB; unable to ambulate.  Pt admitted with COVID infection with secondary bacterial infection, aspiration PNA, recurrent R pleural effusion, and frailty.  PMH includes htn, DM, HLD, COPD, chronic respiratory failure on 2 L home O2, OSA on CPAP, L2-3 kyphoplasty.  Clinical Impression  PT/OT co-evaluation performed.  Prior to hospital admission, pt was modified independent ambulating with RW; lives with his daughter; has PCA when daughter is working (pt has near 24/7 assist); ramp to enter home; no recent falls reported.  Pt oriented to person, place, time (except not year), and general situation; pt appearing to be poor historian during session (conflicting information given during session).  Pt unable to logroll towards L side in bed d/t R LBP but able to sit up on edge of bed (and then lay down in bed) with mod to max assist x2 (using bed sheet); limited time sitting d/t LBP.  Pt c/o R LBP resting in bed but 8/10 L>R LBP in sitting; 3/10 R LBP resting in bed end of session; pt initially reporting recent onset of LBP (last 2-3 days) but then later reporting having it for years.  Pt would benefit from skilled PT to address noted impairments and functional limitations (see below for any additional details).  Upon hospital discharge, pt would benefit from SNF.    Recommendations for follow up therapy are one component of a multi-disciplinary discharge planning process, led by the attending physician.  Recommendations may be updated based on patient status, additional functional criteria and insurance authorization.  Follow Up Recommendations Skilled nursing-short term rehab (<3 hours/day) Can patient physically be transported by private vehicle: No    Assistance Recommended at  Discharge Frequent or constant Supervision/Assistance  Patient can return home with the following  Two people to help with walking and/or transfers;Two people to help with bathing/dressing/bathroom;Assistance with cooking/housework;Assist for transportation;Help with stairs or ramp for entrance    Equipment Recommendations Rolling walker (2 wheels);BSC/3in1;Wheelchair (measurements PT);Wheelchair cushion (measurements PT);Hospital bed  Recommendations for Other Services       Functional Status Assessment Patient has had a recent decline in their functional status and demonstrates the ability to make significant improvements in function in a reasonable and predictable amount of time.     Precautions / Restrictions Precautions Precautions: Fall Restrictions Weight Bearing Restrictions: No      Mobility  Bed Mobility Overal bed mobility: Needs Assistance Bed Mobility: Rolling, Supine to Sit, Sit to Supine           General bed mobility comments: pt unable to logroll towards L side in bed d/t R LBP with 1 assist so performed supine to/from sit with mod to max assist x2 using bed pad    Transfers                   General transfer comment: pt unable to sit long enough (d/t LBP) to attempt transfer    Ambulation/Gait               General Gait Details: unable to attempt d/t LBP  Stairs            Wheelchair Mobility    Modified Rankin (Stroke Patients Only)       Balance Overall balance assessment: Needs assistance Sitting-balance support: Bilateral upper extremity supported, Feet  supported Sitting balance-Leahy Scale: Fair Sitting balance - Comments: steady static sitting                                     Pertinent Vitals/Pain Pain Assessment Pain Assessment: 0-10 Pain Score: 3  (8/10 L>R LBP in sitting) Pain Location: R LBP Pain Descriptors / Indicators: Sore, Tender, Guarding, Constant Pain Intervention(s): Limited  activity within patient's tolerance, Monitored during session, Repositioned, Other (comment) (RN notified) Vitals (HR and O2 on 2 L via nasal cannula) stable and WFL throughout treatment session.    Home Living Family/patient expects to be discharged to:: Private residence Living Arrangements: Children (Pt's daughter) Available Help at Discharge: Family;Personal care attendant (pt's daughter works; PCA comes to assist pt when dtr is working; has near Clinical cytogeneticist) Type of Home: [place] Access: Alex: One Rudy: Conservation officer, nature (2 wheels);Cane - single point;Tub bench;BSC/3in1;Hand held shower head      Prior Function Prior Level of Function : Needs assist             Mobility Comments: Ambulatory with RW.  No recent falls reported in past 6 months. ADLs Comments: Pt's daughter assists pt with ADL's and IADL's as needed (pt's PCA will assist pt as needed); independent with dressing; pt's daughter drives pt     Hand Dominance        Extremity/Trunk Assessment   Upper Extremity Assessment Upper Extremity Assessment: Defer to OT evaluation    Lower Extremity Assessment Lower Extremity Assessment: Generalized weakness;RLE deficits/detail (at least 3/5 AROM DF/PF and able to perform L LE heelside in bed fairly well (unable to sit long enough on edge of bed to perform MMT)) RLE Deficits / Details: at least 3/5 AROM DF/PF; minimal range heelslide in bed d/t low back pain; pt unable to sit long enough on edge of bed to perform R LE MMT RLE: Unable to fully assess due to pain       Communication   Communication: HOH  Cognition Arousal/Alertness: Awake/alert Behavior During Therapy: WFL for tasks assessed/performed (pt joking around typically but briefly voicing frustration at times)                                   General Comments: Oriented to person, place, time (except not year), and general situation.  Pt  appearing inconsistent with information during session.        General Comments  Nursing cleared pt for participation in physical therapy.  Pt agreeable to PT session.    Exercises     Assessment/Plan    PT Assessment Patient needs continued PT services  PT Problem List Decreased strength;Decreased activity tolerance;Decreased balance;Decreased mobility;Cardiopulmonary status limiting activity;Pain       PT Treatment Interventions DME instruction;Gait training;Functional mobility training;Therapeutic activities;Therapeutic exercise;Balance training;Patient/family education    PT Goals (Current goals can be found in the Care Plan section)  Acute Rehab PT Goals Patient Stated Goal: to improve strength, pain, and mobility PT Goal Formulation: With patient Time For Goal Achievement: 12/26/21 Potential to Achieve Goals: Fair    Frequency Min 2X/week     Co-evaluation PT/OT/SLP Co-Evaluation/Treatment: Yes Reason for Co-Treatment: For patient/therapist safety;To address functional/ADL transfers PT goals addressed during session: Mobility/safety with mobility OT goals addressed during session: ADL's and self-care  AM-PAC PT "6 Clicks" Mobility  Outcome Measure Help needed turning from your back to your side while in a flat bed without using bedrails?: Total Help needed moving from lying on your back to sitting on the side of a flat bed without using bedrails?: Total Help needed moving to and from a bed to a chair (including a wheelchair)?: Total Help needed standing up from a chair using your arms (e.g., wheelchair or bedside chair)?: Total Help needed to walk in hospital room?: Total Help needed climbing 3-5 steps with a railing? : Total 6 Click Score: 6    End of Session Equipment Utilized During Treatment: Oxygen (2 L O2 via nasal cannula) Activity Tolerance: Patient limited by pain Patient left: in bed;with call bell/phone within reach;with bed alarm set;Other  (comment) (B heels floating via pillow support) Nurse Communication: Mobility status;Precautions;Weight bearing status;Other (comment) (pt's pain status during session) PT Visit Diagnosis: Other abnormalities of gait and mobility (R26.89);Muscle weakness (generalized) (M62.81);Pain Pain - Right/Left:  (B) Pain - part of body:  (low back)    Time: 8871-9597 PT Time Calculation (min) (ACUTE ONLY): 26 min   Charges:   PT Evaluation $PT Eval Low Complexity: 1 Low         Angelo Prindle, PT 12/12/21, 3:09 PM

## 2021-12-12 NOTE — ED Notes (Signed)
Pt daughter [name] updated on plan of care.  Pt able to speak with her on phone at this time as well.

## 2021-12-12 NOTE — Assessment & Plan Note (Addendum)
COVID-related generalized weakness with ambulatory dysfunction Supportive care with increase nursing assistance  PT OT consult

## 2021-12-12 NOTE — Assessment & Plan Note (Signed)
CPAP nightly

## 2021-12-12 NOTE — H&P (Addendum)
History and Physical    Patient: Arthur [name] PPJ:[id number] DOB: 1933/04/28 DOA: 12/12/2021 DOS: the patient was seen and examined on 12/12/2021 PCP: Baxter Hire, MD  Patient coming from: Home  Chief Complaint:  Chief Complaint  Patient presents with   Weakness    HPI: Arthur [name] is a 88 y.o. male with medical history significant for HTN, BPH CKD 4, anemia of chronic disease on EPO shots by oncology, insulin-dependent type 2 diabetes, diastolic CHF with history of pleural effusion with last thoracentesis 01/2020, followed, advanced COPD, chronic respiratory failure on home O2 at 2 L, and OSA on CPAP followed by pulmonology, and who at baseline is chronically dyspneic per review of pulmonology notes who presents to the ED with worsening shortness of breath and generalized weakness to where he is unable to ambulate.  He denies fever or chills or chest pain.  Denies pain in the lower extremities and denies swelling beyond his baseline. ED course and data review: BP on arrival 126/47 with mild tachypnea of 22 and O2 sat in the high 90s on home flow rate of 2 L. Labs with normal WBC, hemoglobin at baseline at 8.  BMP with creatinine 2.88 which is his baseline, troponin 12 and BNP 54.  Urinalysis unremarkable.  COVID and flu pending. EKG, personally viewed and interpreted showing sinus at 88 with no acute ST-T wave changes.   CT chest without contrast shows a loculated right pleural effusion, and extensive airway impaction with complete collapse of the right lower lobe and near complete collapse of right middle lobe consistent with aspiration and/or acute lobar pneumonia as further outlined below: IMPRESSION: 1. Enlarging, complex loculated right pleural effusion demonstrating progressive heterogeneous attenuation which may relate to proteinaceous or hemorrhagic debris or progressive pleural soft tissue the setting of mesothelioma or metastatic adenocarcinoma. Repeat contrast enhanced  imaging and/or diagnostic thoracentesis may be helpful for further evaluation. 2. Extensive airway impaction involving the right middle and lower lobes with complete collapse the right lower lobe and dense consolidation and near complete collapse of the right middle lobe. Findings may relate to aspiration and/or acute lobar pneumonia. 3. Mild emphysema. 4. Moderate multi-vessel coronary artery calcification. 5. Cholelithiasis.  Patient treated with a DuoNeb, started on Zosyn for aspiration and hospitalist consulted for admission for pneumonia and possible thoracentesis..   Review of Systems: As mentioned in the history of present illness. All other systems reviewed and are negative.  Past Medical History:  Diagnosis Date   Anemia of chronic kidney failure 08/21/2018   Arthritis    lower back   CHF (congestive heart failure) (HCC)    CKD (chronic kidney disease), stage III (HCC)    COPD (chronic obstructive pulmonary disease) ([place])    Diabetes mellitus without complication ([place])    type 2   Dyspnea    when active -wears oxygen 2L via [place]   History of blood transfusion    Hyperlipidemia    Hypertension    Requires continuous at home supplemental oxygen    2L via [place]   Squamous cell carcinoma in situ 09/17/2012   Right upper nasal dorsum. SCCis arising in [place]   Squamous cell carcinoma of skin 02/03/2020   R lat cheek, [facility]   Past Surgical History:  Procedure Laterality Date   COLONOSCOPY WITH PROPOFOL N/A 06/11/2018   Procedure: COLONOSCOPY WITH PROPOFOL;  Surgeon: Jonathon Bellows, MD;  Location: [facility] ENDOSCOPY;  Service: Gastroenterology;  Laterality: N/A;   COLONOSCOPY WITH PROPOFOL N/A 06/13/2018  Procedure: COLONOSCOPY WITH PROPOFOL;  Surgeon: Jonathon Bellows, MD;  Location: [facility] ENDOSCOPY;  Service: Gastroenterology;  Laterality: N/A;   ESOPHAGOGASTRODUODENOSCOPY Left 06/10/2018   Procedure: ESOPHAGOGASTRODUODENOSCOPY (EGD);  Surgeon: Jonathon Bellows, MD;  Location: [facility] ENDOSCOPY;  Service:  Gastroenterology;  Laterality: Left;   EYE SURGERY     removed cataracts   GIVENS CAPSULE STUDY N/A 06/28/2018   Procedure: GIVENS CAPSULE STUDY;  Surgeon: Lucilla Lame, MD;  Location: [facility] ENDOSCOPY;  Service: Endoscopy;  Laterality: N/A;   GIVENS CAPSULE STUDY N/A 06/30/2018   Procedure: GIVENS CAPSULE STUDY;  Surgeon: Jonathon Bellows, MD;  Location: [facility] ENDOSCOPY;  Service: Gastroenterology;  Laterality: N/A;   KYPHOPLASTY N/A 10/31/2018   Procedure: KYPHOPLASTY L2;  Surgeon: [name], MD;  Location: [facility] ORS;  Service: Orthopedics;  Laterality: N/A;   KYPHOPLASTY N/A 12/24/2018   Procedure: KYPHOPLASTY L3;  Surgeon: [name], MD;  Location: [facility] ORS;  Service: Orthopedics;  Laterality: N/A;   KYPHOPLASTY N/A 02/18/2019   Procedure: L2 KYPHOPLASTY;  Surgeon: [name], MD;  Location: [facility] ORS;  Service: Orthopedics;  Laterality: N/A;   RADIOLOGY WITH ANESTHESIA N/A 01/28/2019   Procedure: MRI LUMBER SPINE WITHOUT CONTRAST;  Surgeon: Radiologist, Medication, MD;  Location: [place];  Service: Radiology;  Laterality: N/A;   Social History:  reports that he quit smoking about 39 years ago. His smoking use included cigarettes. He has a 70.00 pack-year smoking history. His smokeless tobacco use includes chew. He reports that he does not currently use alcohol. He reports that he does not use drugs.  Allergies  Allergen Reactions   Codeine Other (See Comments)    Constipation    Family History  Problem Relation Age of Onset   COPD Mother    Cancer Father     Prior to Admission medications   Medication Sig Start Date End Date Taking? Authorizing Provider  acetaminophen (TYLENOL) 325 MG tablet Take 2 tablets (650 mg total) by mouth every 6 (six) hours as needed for mild pain (or Fever >/= 101). 03/07/19   Leslye Peer, Richard, MD  albuterol (VENTOLIN HFA) 108 (90 Base) MCG/ACT inhaler Inhale 2 puffs into the lungs every 6 (six) hours as needed for wheezing or shortness of breath. 05/27/18    Demetrios Loll, MD  amLODipine (NORVASC) 2.5 MG tablet Take 2.5 mg by mouth daily.    [provider]  ascorbic acid (VITAMIN C) 500 MG tablet Take 500 mg by mouth at bedtime. Patient not taking: Reported on 06/29/2021    [provider]  docusate sodium (COLACE) 100 MG capsule Take 1 capsule (100 mg total) by mouth 2 (two) times daily. Patient not taking: Reported on 09/29/2020 11/05/18   Loletha Grayer, MD  insulin aspart (NOVOLOG) 100 UNIT/ML injection 4 units prior to meals if sugars above 200 03/07/19   Wieting, Richard, MD  LEVEMIR 100 UNIT/ML injection Inject 0.04 mLs (4 Units total) into the skin at bedtime. 03/07/19   Loletha Grayer, MD  metoCLOPramide (REGLAN) 5 MG tablet Take 5 mg by mouth 4 (four) times daily -  before meals and at bedtime. Patient not taking: Reported on 03/31/2020    [provider]  pantoprazole (PROTONIX) 40 MG tablet Take 1 tablet (40 mg total) by mouth daily. Patient not taking: Reported on 06/29/2021 03/07/19   Loletha Grayer, MD  polyethylene glycol powder (GLYCOLAX/MIRALAX) 17 GM/SCOOP powder Take 17 g by mouth every other day.  Patient not taking: Reported on 03/31/2020 01/08/19   [provider]  potassium chloride (KLOR-CON)  10 MEQ tablet Take 10 mEq by mouth every evening. Patient not taking: Reported on 06/29/2021    [provider]  pravastatin (PRAVACHOL) 10 MG tablet Take 10 mg by mouth at bedtime. 04/25/18   [provider]  promethazine (PHENERGAN) 25 MG tablet Take 25 mg by mouth every 4 (four) hours as needed for nausea or vomiting.    [provider]  spironolactone (ALDACTONE) 25 MG tablet Take 25 mg by mouth daily. Patient not taking: Reported on 11/28/2021 06/21/21   [provider]  tamsulosin (FLOMAX) 0.4 MG CAPS capsule Take 0.4 mg by mouth daily. 04/25/18   [provider]  tiotropium (SPIRIVA) 18 MCG inhalation capsule Place 18 mcg into inhaler and inhale daily.     [provider]  torsemide (DEMADEX) 20 MG tablet Take 20 mg by mouth every Monday, Wednesday, and Friday.    [provider]  triamcinolone cream (KENALOG) 0.5 %  08/27/19   [provider]  TRUE METRIX BLOOD GLUCOSE TEST test strip 1 each by Other route every morning. 06/20/19   [provider]    Physical Exam: Vitals:   12/12/21 0130 12/12/21 0200 12/12/21 0230 12/12/21 0400  BP:  (!) 138/48 (!) 160/58 (!) 149/50  Pulse: 76 72 81 70  Resp: (!) 23 17 (!) 22 19  Temp:      TempSrc:      SpO2: 96% 100% 98% 98%  Weight:       Physical Exam Vitals and nursing note reviewed.  Constitutional:      General: He is not in acute distress.    Comments: Very frail appearing elderly male  HENT:     Head: Normocephalic and atraumatic.  Cardiovascular:     Rate and Rhythm: Normal rate and regular rhythm.     Heart sounds: Normal heart sounds.  Pulmonary:     Effort: Tachypnea present.     Breath sounds: Decreased air movement present. Wheezing present.  Abdominal:     Palpations: Abdomen is soft.     Tenderness: There is no abdominal tenderness.  Neurological:     Mental Status: Mental status is at baseline.     Labs on Admission: I have personally reviewed following labs and imaging studies  CBC: Recent Labs  Lab 12/11/21 2015  WBC 5.1  HGB 8.0*  HCT 27.6*  MCV 98.2  PLT 076*   Basic Metabolic Panel: Recent Labs  Lab 12/11/21 2015  NA 136  K 4.8  CL 108  CO2 21*  GLUCOSE 131*  BUN 73*  CREATININE 2.88*  CALCIUM 7.4*   GFR: Estimated Creatinine Clearance: 17.8 mL/min (A) (by C-G formula based on SCr of 2.88 mg/dL (H)). Liver Function Tests: No results for input(s): "AST", "ALT", "ALKPHOS", "BILITOT", "PROT", "ALBUMIN" in the last 168 hours. No results for input(s): "LIPASE", "AMYLASE" in the last 168 hours. No results for input(s): "AMMONIA" in the last 168 hours. Coagulation Profile: No results for input(s): "INR", "PROTIME"  in the last 168 hours. Cardiac Enzymes: No results for input(s): "CKTOTAL", "CKMB", "CKMBINDEX", "TROPONINI" in the last 168 hours. BNP (last 3 results) No results for input(s): "PROBNP" in the last 8760 hours. HbA1C: No results for input(s): "HGBA1C" in the last 72 hours. CBG: No results for input(s): "GLUCAP" in the last 168 hours. Lipid Profile: No results for input(s): "CHOL", "HDL", "LDLCALC", "TRIG", "CHOLHDL", "LDLDIRECT" in the last 72 hours. Thyroid Function Tests: No results for input(s): "TSH", "T4TOTAL", "FREET4", "T3FREE", "THYROIDAB" in the last  72 hours. Anemia Panel: No results for input(s): "VITAMINB12", "FOLATE", "FERRITIN", "TIBC", "IRON", "RETICCTPCT" in the last 72 hours. Urine analysis:    Component Value Date/Time   COLORURINE YELLOW (A) 12/12/2021 0158   APPEARANCEUR HAZY (A) 12/12/2021 0158   LABSPEC 1.013 12/12/2021 0158   PHURINE 5.0 12/12/2021 0158   GLUCOSEU NEGATIVE 12/12/2021 0158   HGBUR SMALL (A) 12/12/2021 0158   BILIRUBINUR NEGATIVE 12/12/2021 0158   KETONESUR NEGATIVE 12/12/2021 0158   PROTEINUR >=300 (A) 12/12/2021 0158   NITRITE NEGATIVE 12/12/2021 0158   LEUKOCYTESUR NEGATIVE 12/12/2021 0158    Radiological Exams on Admission: CT Chest Wo Contrast  Result Date: 12/12/2021 CLINICAL DATA:  Pneumonia, complication suspected, xray done EXAM: CT CHEST WITHOUT CONTRAST TECHNIQUE: Multidetector CT imaging of the chest was performed following the standard protocol without IV contrast. RADIATION DOSE REDUCTION: This exam was performed according to the departmental dose-optimization program which includes automated exposure control, adjustment of the mA and/or kV according to patient size and/or use of iterative reconstruction technique. COMPARISON:  10/07/2018, CT abdomen pelvis 03/04/2019 FINDINGS: Cardiovascular: Moderate multi-vessel coronary artery calcification. Global cardiac size within normal limits. No pericardial effusion. Central pulmonary  arteries are enlarged in keeping with changes of pulmonary arterial hypertension. Moderate atherosclerotic calcification within the thoracic aorta. No aortic aneurysm. Mediastinum/Nodes: There is progressive mediastinal shift to the right related to right-sided volume loss. Visualized thyroid is unremarkable. No pathologic thoracic adenopathy. The esophagus is unremarkable. Small hiatal hernia. Lungs/Pleura: Complex large, posterobasal loculated right pleural effusion is again identified demonstrating heterogeneous attenuation which may relate to proteinaceous or hemorrhagic debris or progressive pleural soft tissue the setting of mesothelioma or metastatic adenocarcinoma. Evaluation is slightly limited by noncontrast technique. The right lower lobe is completely collapsed with extensive airway impaction. There is extensive airway impaction involving the right middle lobar bronchi with dense consolidation of the right middle lobe and near complete collapse. Mild emphysema. No pleural effusion on the left. No pneumothorax. Upper Abdomen: Cholelithiasis. Hyperdense gallbladder contents may relate to vicarious excretion of contrast or layering sludge. No superimposed pericholecystic inflammatory change. No acute abnormality. Musculoskeletal: Degenerative changes are noted throughout the thoracolumbar spine with ankylosis of the vertebral bodies of T5-T12. L3 vertebroplasty has been performed. No acute bone abnormality. No lytic or blastic bone lesion. IMPRESSION: 1. Enlarging, complex loculated right pleural effusion demonstrating progressive heterogeneous attenuation which may relate to proteinaceous or hemorrhagic debris or progressive pleural soft tissue the setting of mesothelioma or metastatic adenocarcinoma. Repeat contrast enhanced imaging and/or diagnostic thoracentesis may be helpful for further evaluation. 2. Extensive airway impaction involving the right middle and lower lobes with complete collapse the  right lower lobe and dense consolidation and near complete collapse of the right middle lobe. Findings may relate to aspiration and/or acute lobar pneumonia. 3. Mild emphysema. 4. Moderate multi-vessel coronary artery calcification. 5. Cholelithiasis. Aortic Atherosclerosis (ICD10-I70.0) and Emphysema (ICD10-J43.9). Electronically Signed   By: Fidela Salisbury M.D.   On: 12/12/2021 03:22   DG Chest Portable 1 View  Result Date: 12/12/2021 CLINICAL DATA:  Generalized weakness EXAM: PORTABLE CHEST 1 VIEW COMPARISON:  01/14/2020 FINDINGS: Cardiac shadow is enlarged. Right-sided basilar consolidation is noted similar to that seen on the prior exam. Previous CT imaging has shown loculated pleural effusion in this region as well. The overall appearance is stable. Mild vascular congestion is noted without significant edema. No new focal infiltrate is noted. IMPRESSION: Persistent consolidation and loculated fluid on the right in the base stable from the prior exam. Mild  vascular congestion. Electronically Signed   By: Inez Catalina M.D.   On: 12/12/2021 02:43     Data Reviewed: Relevant notes from primary care and specialist visits, past discharge summaries as available in EHR, including Care Everywhere. Prior diagnostic testing as pertinent to current admission diagnoses Updated medications and problem lists for reconciliation ED course, including vitals, labs, imaging, treatment and response to treatment Triage notes, nursing and pharmacy notes and ED provider's notes Notable results as noted in HPI   Assessment and Plan: * Aspiration pneumonia ([place]) COVID infection with secondary bacterial infection Patient presents with generalized weakness and shortness of breath COVID test positive CT chest showing "extensive airway impaction involving the right middle and lower lobes with complete collapse the right lower lobe and dense consolidation and near complete collapse of the right middle lobe.Findings may  relate to aspiration and/or acute lobar pneumonia" Continue Zosyn for suspected bacterial superinfection Paxlovid Aspiration precautions Airborne precautions Consider pulmonology consult Follow blood cultures  Recurrent right pleural effusion Enlarging, complex loculated right pleural effusion on CT IR consult for diagnostic thoracentesis Consider pulmonology consult prior to thoracentesis given that effusion is loculated.  Frailty COVID-related generalized weakness with ambulatory dysfunction Supportive care with increase nursing assistance  PT OT consult  COPD (chronic obstructive pulmonary disease) (HCC) Mild wheezing on exam Continue home Spiriva and DuoNebs as needed  Chronic respiratory failure with hypoxia (HCC) O2 requirement is at baseline.  Satting in the high 90s on 2 L  CKD (chronic kidney disease) stage 4, GFR 15-29 ml/min (HCC) Anemia in CKD Renal function and hemoglobin at baseline Patient gets epoetin shots with hematology  Type II diabetes mellitus ([place]) Patient is on insulin Continue basal insulin with sliding scale coverage  Essential hypertension Blood pressure controlled Continue amlodipine, spironolactone and torsemide  OSA on CPAP CPAP nightly    DVT prophylaxis: SCD  Consults: none  Advance Care Planning:   Code Status: Prior   Family Communication: DNR  Disposition Plan: Back to previous home environment  Severity of Illness: The appropriate patient status for this patient is INPATIENT. Inpatient status is judged to be reasonable and necessary in order to provide the required intensity of service to ensure the patient's safety. The patient's presenting symptoms, physical exam findings, and initial radiographic and laboratory data in the context of their chronic comorbidities is felt to place them at high risk for further clinical deterioration. Furthermore, it is not anticipated that the patient will be medically stable for discharge from  the hospital within 2 midnights of admission.   * I certify that at the point of admission it is my clinical judgment that the patient will require inpatient hospital care spanning beyond 2 midnights from the point of admission due to high intensity of service, high risk for further deterioration and high frequency of surveillance required.*  Author: Athena Masse, MD 12/12/2021 4:32 AM  For on call review [email or web address].

## 2021-12-12 NOTE — Telephone Encounter (Signed)
Thanks. Will monitor for discharge date to r/s accordingly.

## 2021-12-12 NOTE — Progress Notes (Signed)
PHARMACY -  BRIEF ANTIBIOTIC NOTE   Pharmacy has received consult(s) for Zosyn from an ED provider.  The patient's profile has been reviewed for ht/wt/allergies/indication/available labs.    One time order(s) placed for Zoysn 3.375 gm EI IV X 1   Further antibiotics/pharmacy consults should be ordered by admitting physician if indicated.                       Thank you, Kypton Eltringham D 12/12/2021  4:34 AM

## 2021-12-12 NOTE — ED Notes (Signed)
Pt provided with pericare, dry clean disposable brief, and repositioned in bed at this time.

## 2021-12-12 NOTE — ED Provider Notes (Signed)
[facility] Provider Note    Event Date/Time   First MD Initiated Contact with Patient 12/12/21 0122     (approximate)   History   Weakness   HPI  Arthur [name] is a 88 y.o. male  with PMHx HTN, DM, HLD, COPD, here with SOB, weakness. Pt reports that he has been increasingly weak over the past several days. He has had difficulty even ambulating today due to this. He reports he has a somewhat chronic cough from COPD but has had increase in this. He has had poor appetite. He has had difficulty and SOB with exertion for the last several days. No chest pain. No known fevers.       Physical Exam   Triage Vital Signs: ED Triage Vitals  Enc Vitals Group     BP 12/11/21 2013 (!) 126/47     Pulse Rate 12/11/21 2013 87     Resp 12/11/21 2013 18     Temp 12/11/21 2013 98.3 F (36.8 C)     Temp Source 12/11/21 2013 Oral     SpO2 12/11/21 2013 95 %     Weight 12/11/21 2013 170 lb (77.1 kg)     Height --      Head Circumference --      Peak Flow --      Pain Score 12/12/21 0216 Asleep     Pain Loc --      Pain Edu? --      Excl. in [place]? --     Most recent vital signs: Vitals:   12/12/21 0400 12/12/21 0523  BP: (!) 149/50   Pulse: 70   Resp: 19   Temp:  99.3 F (37.4 C)  SpO2: 98%      General: Awake, no distress.  CV:  Good peripheral perfusion. RRR. Resp:  Normal effort. Lungs with markedly diminished aeration and rales throughout R base. Mild wheezes noted LLL. Abd:  No distention. No tenderness. Other:  No leg edema.   ED Results / Procedures / Treatments   Labs (all labs ordered are listed, but only abnormal results are displayed) Labs Reviewed  SARS CORONAVIRUS 2 BY RT PCR - Abnormal; Notable for the following components:      Result Value   SARS Coronavirus 2 by RT PCR POSITIVE (*)    All other components within normal limits  BASIC METABOLIC PANEL - Abnormal; Notable for the following components:   CO2 21 (*)    Glucose, Bld  131 (*)    BUN 73 (*)    Creatinine, Ser 2.88 (*)    Calcium 7.4 (*)    GFR, Estimated 21 (*)    All other components within normal limits  CBC - Abnormal; Notable for the following components:   RBC 2.81 (*)    Hemoglobin 8.0 (*)    HCT 27.6 (*)    MCHC 29.0 (*)    RDW 19.0 (*)    Platelets 147 (*)    nRBC 0.6 (*)    All other components within normal limits  URINALYSIS, ROUTINE W REFLEX MICROSCOPIC - Abnormal; Notable for the following components:   Color, Urine YELLOW (*)    APPearance HAZY (*)    Hgb urine dipstick SMALL (*)    Protein, ur >=300 (*)    All other components within normal limits  CBG MONITORING, ED - Abnormal; Notable for the following components:   Glucose-Capillary 155 (*)    All other components within normal limits  CULTURE, BLOOD (ROUTINE X 2)  CULTURE, BLOOD (ROUTINE X 2)  RESP PANEL BY RT-PCR (FLU A&B, COVID) ARPGX2  EXPECTORATED SPUTUM ASSESSMENT W GRAM STAIN, RFLX TO RESP C  BRAIN NATRIURETIC PEPTIDE  HEMOGLOBIN A1C  STREP PNEUMONIAE URINARY ANTIGEN  LEGIONELLA PNEUMOPHILA SEROGP 1 UR AG  PROCALCITONIN  TROPONIN I (HIGH SENSITIVITY)  TROPONIN I (HIGH SENSITIVITY)     EKG Wide QRS, ventricular rate 88.  QRS 126, QTc 447.  No acute ST elevations or depressions.     RADIOLOGY CXR: Persistent consolidation and pleural effusion on right CT Chest: Extensive airway impaction with PNA, loculated pleural effusion on R with possible soft tissue component?   I also independently reviewed and agree with radiologist interpretations.   PROCEDURES:  Critical Care performed: No  .1-3 Lead EKG Interpretation  Performed by: Duffy Bruce, MD Authorized by: Duffy Bruce, MD     Interpretation: normal     ECG rate:  70-90   ECG rate assessment: normal     Rhythm: sinus rhythm     Ectopy: none     Conduction: normal   Comments:     Indication: SOB .Critical Care  Performed by: Duffy Bruce, MD Authorized by: Duffy Bruce, MD    Critical care provider statement:    Critical care time (minutes):  30   Critical care time was exclusive of:  Separately billable procedures and treating other patients   Critical care was necessary to treat or prevent imminent or life-threatening deterioration of the following conditions:  Cardiac failure, circulatory failure and respiratory failure   Critical care was time spent personally by me on the following activities:  Development of treatment plan with patient or surrogate, discussions with consultants, evaluation of patient's response to treatment, examination of patient, ordering and review of laboratory studies, ordering and review of radiographic studies, ordering and performing treatments and interventions, pulse oximetry, re-evaluation of patient's condition and review of old [facility]: Medications  amLODipine (NORVASC) tablet 2.5 mg (has no administration in time range)  insulin detemir (LEVEMIR) injection 4 Units (has no administration in time range)  pantoprazole (PROTONIX) EC tablet 40 mg (has no administration in time range)  tiotropium (SPIRIVA) inhalation capsule ([facility] use ONLY) 18 mcg (has no administration in time range)  acetaminophen (TYLENOL) tablet 650 mg (has no administration in time range)    Or  acetaminophen (TYLENOL) suppository 650 mg (has no administration in time range)  ondansetron (ZOFRAN) tablet 4 mg (has no administration in time range)    Or  ondansetron (ZOFRAN) injection 4 mg (has no administration in time range)  insulin aspart (novoLOG) injection 0-15 Units (3 Units Subcutaneous Given 12/12/21 0528)  HYDROcodone-acetaminophen (NORCO/VICODIN) 5-325 MG per tablet 1-2 tablet (has no administration in time range)  piperacillin-tazobactam (ZOSYN) IVPB 2.25 g (0 g Intravenous Stopped 12/12/21 0545)  nirmatrelvir/ritonavir EUA (renal dosing) (PAXLOVID) 2 tablet (2 tablets Oral Given 12/12/21 0551)  ascorbic acid (VITAMIN C)  tablet 500 mg (has no administration in time range)  zinc sulfate capsule 220 mg (has no administration in time range)  albuterol (VENTOLIN HFA) 108 (90 Base) MCG/ACT inhaler 2 puff (has no administration in time range)  ipratropium-albuterol (DUONEB) 0.5-2.5 (3) MG/3ML nebulizer solution 3 mL (3 mLs Nebulization Given 12/12/21 0303)     IMPRESSION / MDM / [place] / ED COURSE  I reviewed the triage vital signs and the nursing notes.  Differential diagnosis includes, but is not limited to, recurrent pleural effusion with pneumonia, COPD exacerbation, UTI, metabolic encephalopathy, polypharmacy  Patient's presentation is most consistent with acute presentation with potential threat to life or bodily function.  The patient is on the cardiac monitor to evaluate for evidence of arrhythmia and/or significant heart rate changes.  88 year old male with past medical history hypertension, CKD 4, anemia of chronic disease, diabetes, CHF, chronic hypoxia, here with generalized weakness.  Patient appears weak but no focal neurological deficits.  Lung exam shows concern for possible pneumonia.  Chest x-ray obtained, reviewed, shows persistent consolidation and loculated fluid.  This has been drained during his last admission per my review of records.  CT chest subsequently obtained shows enlarging complex pleural effusion with compressive atelectasis/pneumonia.  Lab work shows no leukocytosis.  Anemia is at baseline.  BMP shows baseline CKD.  Troponin negative.  COVID also positive interestingly.  Urinalysis negative.  Suspect acute on chronic weakness due to recurrent pleural effusion and pneumonia, also complicated by COVID.  Will admit to hospitalist service.  Patient updated and is in agreement this plan.  IV ABX started, duonebs given.   FINAL CLINICAL IMPRESSION(S) / ED DIAGNOSES   Final diagnoses:  Generalized weakness  Recurrent pleural effusion  Acute  and chronic respiratory failure with hypoxia ([place])  COVID-19     Rx / DC Orders   ED Discharge Orders     None        Note:  This document was prepared using Dragon voice recognition software and may include unintentional dictation errors.   Duffy Bruce, MD 12/12/21 [phone]

## 2021-12-12 NOTE — Progress Notes (Signed)
Pharmacy Antibiotic Note  Arthur [name] is a 88 y.o. male admitted on 12/12/2021 with  aspiration PNA .  Pharmacy has been consulted for Zoysn dosing. Pt is ESRD but not on HD/PD.   Plan: Zosyn 2.25 gm IV Q8H ordered to start on 12/11 @ 0500  Weight: 77.1 kg (170 lb)  Temp (24hrs), Avg:98.3 F (36.8 C), Min:98.2 F (36.8 C), Max:98.3 F (36.8 C)  Recent Labs  Lab 12/11/21 2015  WBC 5.1  CREATININE 2.88*    Estimated Creatinine Clearance: 17.8 mL/min (A) (by C-G formula based on SCr of 2.88 mg/dL (H)).    Allergies  Allergen Reactions   Codeine Other (See Comments)    Constipation    Antimicrobials this admission:   >>    >>   Dose adjustments this admission:   Microbiology results:  BCx:   UCx:    Sputum:    MRSA PCR:   Thank you for allowing pharmacy to be a part of this patient's care.  Jerusalen Mateja D 12/12/2021 4:57 AM

## 2021-12-12 NOTE — ED Notes (Signed)
Patient transported to CT 

## 2021-12-12 NOTE — Assessment & Plan Note (Signed)
Blood pressure controlled Continue amlodipine, spironolactone and torsemide

## 2021-12-12 NOTE — Consult Note (Signed)
PULMONOLOGY         Date: 12/12/2021,   MRN# [id number] Arthur [name] Aug 18, 1933     AdmissionWeight: 77.1 kg                 CurrentWeight: 77.1 kg  Referring provider: Dr Si Raider   CHIEF COMPLAINT:   Large right pleural effusion   HISTORY OF PRESENT ILLNESS    HPI: Arthur [name] is a 88 y.o. male with medical history significant for HTN, BPH CKD 4, anemia of chronic disease on EPO shots by oncology, insulin-dependent type 2 diabetes, diastolic CHF with history of pleural effusion with last thoracentesis 01/2020, advanced COPD, chronic respiratory failure on home O2 at 2 L, and OSA on CPAP followed by pulmonology, and who at baseline is chronically dyspneic per review of pulmonology notes who presents to the ED with worsening shortness of breath and generalized weakness to where he is unable to ambulate.  He denies fever or chills or chest pain.  Denies pain in the lower extremities and denies swelling beyond his baseline. ED course and data review: BP on arrival 126/47 with mild tachypnea of 22 and O2 sat in the high 90s on home flow rate of 2 L. Labs with normal WBC, hemoglobin at baseline at 8.  BMP with creatinine 2.88 which is his baseline, troponin 12 and BNP 54.  Urinalysis unremarkable.  COVID and flu pending. EKG, personally viewed and interpreted showing sinus at 88 with no acute ST-T wave changes.   CT chest without contrast shows a loculated right pleural effusion, and extensive airway impaction with complete collapse of the right lower lobe and near complete collapse of right middle lobe consistent with aspiration and/or acute lobar pneumonia as further outlined below: IMPRESSION: 1. Enlarging, complex loculated right pleural effusion demonstrating progressive heterogeneous attenuation which may relate to proteinaceous or hemorrhagic debris or progressive pleural soft tissue the setting of mesothelioma or metastatic adenocarcinoma. Repeat contrast enhanced imaging  and/or diagnostic thoracentesis may be helpful for further evaluation. 2. Extensive airway impaction involving the right middle and lower lobes with complete collapse the right lower lobe and dense consolidation and near complete collapse of the right middle lobe. Findings may relate to aspiration and/or acute lobar pneumonia. 3. Mild emphysema. 4. Moderate multi-vessel coronary artery calcification. 5. Cholelithiasis.  Pulmonology consulted for additional evaluation of acute on chronic hypoxemia.    PAST MEDICAL HISTORY   Past Medical History:  Diagnosis Date   Anemia of chronic kidney failure 08/21/2018   Arthritis    lower back   CHF (congestive heart failure) (HCC)    CKD (chronic kidney disease), stage III (HCC)    COPD (chronic obstructive pulmonary disease) ([place])    Diabetes mellitus without complication ([place])    type 2   Dyspnea    when active -wears oxygen 2L via [place]   History of blood transfusion    Hyperlipidemia    Hypertension    OSA on CPAP 12/12/2021   Requires continuous at home supplemental oxygen    2L via    Squamous cell carcinoma in situ 09/17/2012   Right upper nasal dorsum. SCCis arising in [place]   Squamous cell carcinoma of skin 02/03/2020   R lat cheek, EDC     SURGICAL HISTORY   Past Surgical History:  Procedure Laterality Date   COLONOSCOPY WITH PROPOFOL N/A 06/11/2018   Procedure: COLONOSCOPY WITH PROPOFOL;  Surgeon: Jonathon Bellows, MD;  Location: [facility] ENDOSCOPY;  Service: Gastroenterology;  Laterality: N/A;   COLONOSCOPY WITH PROPOFOL N/A 06/13/2018   Procedure: COLONOSCOPY WITH PROPOFOL;  Surgeon: Jonathon Bellows, MD;  Location: [facility] ENDOSCOPY;  Service: Gastroenterology;  Laterality: N/A;   ESOPHAGOGASTRODUODENOSCOPY Left 06/10/2018   Procedure: ESOPHAGOGASTRODUODENOSCOPY (EGD);  Surgeon: Jonathon Bellows, MD;  Location: [facility] ENDOSCOPY;  Service: Gastroenterology;  Laterality: Left;   EYE SURGERY     removed cataracts   GIVENS CAPSULE STUDY N/A 06/28/2018    Procedure: GIVENS CAPSULE STUDY;  Surgeon: Lucilla Lame, MD;  Location: [facility] ENDOSCOPY;  Service: Endoscopy;  Laterality: N/A;   GIVENS CAPSULE STUDY N/A 06/30/2018   Procedure: GIVENS CAPSULE STUDY;  Surgeon: Jonathon Bellows, MD;  Location: [facility] ENDOSCOPY;  Service: Gastroenterology;  Laterality: N/A;   KYPHOPLASTY N/A 10/31/2018   Procedure: KYPHOPLASTY L2;  Surgeon: [name], MD;  Location: [facility] ORS;  Service: Orthopedics;  Laterality: N/A;   KYPHOPLASTY N/A 12/24/2018   Procedure: KYPHOPLASTY L3;  Surgeon: [name], MD;  Location: [facility] ORS;  Service: Orthopedics;  Laterality: N/A;   KYPHOPLASTY N/A 02/18/2019   Procedure: L2 KYPHOPLASTY;  Surgeon: [name], MD;  Location: [facility] ORS;  Service: Orthopedics;  Laterality: N/A;   RADIOLOGY WITH ANESTHESIA N/A 01/28/2019   Procedure: MRI LUMBER SPINE WITHOUT CONTRAST;  Surgeon: Radiologist, Medication, MD;  Location: [place];  Service: Radiology;  Laterality: N/A;     FAMILY HISTORY   Family History  Problem Relation Age of Onset   COPD Mother    Cancer Father      SOCIAL HISTORY   Social History   Tobacco Use   Smoking status: Former    Packs/day: 2.00    Years: 35.00    Total pack years: 70.00    Types: Cigarettes    Quit date: 11/04/1982    Years since quitting: 39.1   Smokeless tobacco: Current    Types: Chew  Vaping Use   Vaping Use: Never used  Substance Use Topics   Alcohol use: Not Currently   Drug use: No     MEDICATIONS    Home Medication:  Current Outpatient Rx   Order #: [id number] Class: Historical Med   Order #: [id number] Class: Historical Med   Order #: [id number] Class: Historical Med   Order #: [id number] Class: Historical Med   Order #: [id number] Class: Historical Med   Order #: [id number] Class: OTC   Order #: [id number] Class: Print   Order #: [id number] Class: Historical Med   Order #: [id number] Class: Print   Order #: [id number] Class: No Print   Order #: [id number] Class: No Print   Order #:  [id number] Class: Historical Med   Order #: [id number] Class: No Print   Order #: [id number] Class: Historical Med   Order #: [id number] Class: Historical Med   Order #: [id number] Class: Historical Med   Order #: [id number] Class: Historical Med   Order #: [id number] Class: Historical Med   Order #: [id number] Class: Historical Med    Current Medication:  Current Facility-Administered Medications:    acetaminophen (TYLENOL) tablet 650 mg, 650 mg, Oral, Q6H PRN **OR** acetaminophen (TYLENOL) suppository 650 mg, 650 mg, Rectal, Q6H PRN, Athena Masse, MD   albuterol (VENTOLIN HFA) 108 (90 Base) MCG/ACT inhaler 2 puff, 2 puff, Inhalation, Q6H, Athena Masse, MD, 2 puff at 12/12/21 0833   amLODipine (NORVASC) tablet 2.5 mg, 2.5 mg, Oral, Daily, Judd Gaudier V, MD, 2.5 mg at 12/12/21 1011   ascorbic acid (VITAMIN C) tablet 500 mg, 500 mg, Oral, Daily, Judd Gaudier V, MD, 500 mg at 12/12/21 1013   cefTRIAXone (ROCEPHIN) 1 g in sodium chloride 0.9 %  100 mL IVPB, 1 g, Intravenous, Q24H, Wouk, Ailene Rud, MD, Last Rate: 200 mL/hr at 12/12/21 1233, 1 g at 12/12/21 1233   heparin injection 5,000 Units, 5,000 Units, Subcutaneous, Q8H, Wouk, Ailene Rud, MD, 5,000 Units at 12/12/21 7371   HYDROcodone-acetaminophen (NORCO/VICODIN) 5-325 MG per tablet 1-2 tablet, 1-2 tablet, Oral, Q4H PRN, Athena Masse, MD   insulin aspart (novoLOG) injection 0-9 Units, 0-9 Units, Subcutaneous, TID WC, Wouk, Ailene Rud, MD   insulin detemir (LEVEMIR) injection 4 Units, 4 Units, Subcutaneous, QHS, Damita Dunnings, Waldemar Dickens, MD   metroNIDAZOLE (FLAGYL) IVPB 500 mg, 500 mg, Intravenous, Q12H, Wouk, Ailene Rud, MD, Last Rate: 100 mL/hr at 12/12/21 1230, 500 mg at 12/12/21 1230   ondansetron (ZOFRAN) tablet 4 mg, 4 mg, Oral, Q6H PRN **OR** ondansetron (ZOFRAN) injection 4 mg, 4 mg, Intravenous, Q6H PRN, Athena Masse, MD   pantoprazole (PROTONIX) EC tablet 40 mg, 40 mg, Oral, Daily, Judd Gaudier V, MD, 40 mg at 12/12/21 1012   [START  ON 12/17/2021] pravastatin (PRAVACHOL) tablet 10 mg, 10 mg, Oral, QHS, Wouk, Ailene Rud, MD   predniSONE (DELTASONE) tablet 40 mg, 40 mg, Oral, Q breakfast, Wouk, Ailene Rud, MD, 40 mg at 12/12/21 0904   tamsulosin (FLOMAX) capsule 0.4 mg, 0.4 mg, Oral, QPM, Wouk, Ailene Rud, MD   Derrill Memo ON 12/13/2021] tiotropium [facility]) inhalation capsule ([facility] use ONLY) 18 mcg, 18 mcg, Inhalation, Daily, Wouk, Ailene Rud, MD   torsemide [facility]) tablet 40 mg, 40 mg, Oral, Daily, Wouk, Ailene Rud, MD, 40 mg at 12/12/21 1012   zinc sulfate capsule 220 mg, 220 mg, Oral, Daily, Judd Gaudier V, MD, 220 mg at 12/12/21 1012  Current Outpatient Medications:    amLODipine (NORVASC) 2.5 MG tablet, Take 2.5 mg by mouth daily., Disp: , Rfl:    pravastatin (PRAVACHOL) 10 MG tablet, Take 10 mg by mouth at bedtime., Disp: , Rfl:    tamsulosin (FLOMAX) 0.4 MG CAPS capsule, Take 0.4 mg by mouth daily., Disp: , Rfl:    tiotropium (SPIRIVA) 18 MCG inhalation capsule, Place 18 mcg into inhaler and inhale daily., Disp: , Rfl:    torsemide (DEMADEX) 20 MG tablet, Take 20 mg by mouth every Monday, Wednesday, and Friday., Disp: , Rfl:    acetaminophen (TYLENOL) 325 MG tablet, Take 2 tablets (650 mg total) by mouth every 6 (six) hours as needed for mild pain (or Fever >/= 101)., Disp:  , Rfl:    albuterol (VENTOLIN HFA) 108 (90 Base) MCG/ACT inhaler, Inhale 2 puffs into the lungs every 6 (six) hours as needed for wheezing or shortness of breath., Disp: 1 Inhaler, Rfl: 2   ascorbic acid (VITAMIN C) 500 MG tablet, Take 500 mg by mouth at bedtime. (Patient not taking: Reported on 06/29/2021), Disp: , Rfl:    docusate sodium (COLACE) 100 MG capsule, Take 1 capsule (100 mg total) by mouth 2 (two) times daily. (Patient not taking: Reported on 09/29/2020), Disp: 60 capsule, Rfl: 0   insulin aspart (NOVOLOG) 100 UNIT/ML injection, 4 units prior to meals if sugars above 200, Disp: 10 mL, Rfl: 11   LEVEMIR 100 UNIT/ML injection, Inject  0.04 mLs (4 Units total) into the skin at bedtime., Disp: 10 mL, Rfl: 0   metoCLOPramide (REGLAN) 5 MG tablet, Take 5 mg by mouth 4 (four) times daily -  before meals and at bedtime. (Patient not taking: Reported on 03/31/2020), Disp: , Rfl:    pantoprazole (PROTONIX) 40 MG tablet, Take 1 tablet (40 mg total) by  mouth daily. (Patient not taking: Reported on 06/29/2021), Disp: , Rfl:    polyethylene glycol powder (GLYCOLAX/MIRALAX) 17 GM/SCOOP powder, Take 17 g by mouth every other day.  (Patient not taking: Reported on 03/31/2020), Disp: , Rfl:    potassium chloride (KLOR-CON) 10 MEQ tablet, Take 10 mEq by mouth every evening. (Patient not taking: Reported on 06/29/2021), Disp: , Rfl:    promethazine (PHENERGAN) 25 MG tablet, Take 25 mg by mouth every 4 (four) hours as needed for nausea or vomiting., Disp: , Rfl:    spironolactone (ALDACTONE) 25 MG tablet, Take 25 mg by mouth daily. (Patient not taking: Reported on 11/28/2021), Disp: , Rfl:    triamcinolone cream (KENALOG) 0.5 %, , Disp: , Rfl:    TRUE METRIX BLOOD GLUCOSE TEST test strip, 1 each by Other route every morning., Disp: , Rfl:   Facility-Administered Medications Ordered in Other Encounters:    0.9 %  sodium chloride infusion, , Intravenous, Continuous, Earlie Server, MD, Last Rate: 10 mL/hr at 10/15/20 1452, New Bag at 10/15/20 1452    ALLERGIES   Codeine     REVIEW OF SYSTEMS    Review of Systems:  Gen:  Denies  fever, sweats, chills weigh loss  HEENT: Denies blurred vision, double vision, ear pain, eye pain, hearing loss, nose bleeds, sore throat Cardiac:  No dizziness, chest pain or heaviness, chest tightness,edema Resp:   reports dyspnea chronically now worsened Gi: Denies swallowing difficulty, stomach pain, nausea or vomiting, diarrhea, constipation, bowel incontinence Gu:  Denies bladder incontinence, burning urine Ext:   Denies Joint pain, stiffness or swelling Skin: Denies  skin rash, easy bruising or bleeding or  hives Endoc:  Denies polyuria, polydipsia , polyphagia or weight change Psych:   Denies depression, insomnia or hallucinations   Other:  All other systems negative   VS: BP (!) 140/44   Pulse 69   Temp 98.8 F (37.1 C) (Oral)   Resp (!) 22   Wt 77.1 kg   SpO2 (!) 89%   BMI 25.85 kg/m      PHYSICAL EXAM    GENERAL:NAD, no fevers, chills, no weakness no fatigue HEAD: Normocephalic, atraumatic.  EYES: Pupils equal, round, reactive to light. Extraocular muscles intact. No scleral icterus.  MOUTH: Moist mucosal membrane. Dentition intact. No abscess noted.  EAR, NOSE, THROAT: Clear without exudates. No external lesions.  NECK: Supple. No thyromegaly. No nodules. No JVD.  PULMONARY: decreased breath sounds on right with mild rhonchi worse at bases bilaterally.  CARDIOVASCULAR: S1 and S2. Regular rate and rhythm. No murmurs, rubs, or gallops. No edema. Pedal pulses 2+ bilaterally.  GASTROINTESTINAL: Soft, nontender, nondistended. No masses. Positive bowel sounds. No hepatosplenomegaly.  MUSCULOSKELETAL: No swelling, clubbing, or edema. Range of motion full in all extremities.  NEUROLOGIC: Cranial nerves II through XII are intact. No gross focal neurological deficits. Sensation intact. Reflexes intact.  SKIN: No ulceration, lesions, rashes, or cyanosis. Skin warm and dry. Turgor intact.  PSYCHIATRIC: Mood, affect within normal limits. The patient is awake, alert and oriented x 3. Insight, judgment intact.       IMAGING     Narrative & Impression  CLINICAL DATA:  Pneumonia, complication suspected, xray done   EXAM: CT CHEST WITHOUT CONTRAST   TECHNIQUE: Multidetector CT imaging of the chest was performed following the standard protocol without IV contrast.   RADIATION DOSE REDUCTION: This exam was performed according to the departmental dose-optimization program which includes automated exposure control, adjustment of the mA and/or kV according  to patient size and/or  use of iterative reconstruction technique.   COMPARISON:  10/07/2018, CT abdomen pelvis 03/04/2019   FINDINGS: Cardiovascular: Moderate multi-vessel coronary artery calcification. Global cardiac size within normal limits. No pericardial effusion. Central pulmonary arteries are enlarged in keeping with changes of pulmonary arterial hypertension. Moderate atherosclerotic calcification within the thoracic aorta. No aortic aneurysm.   Mediastinum/Nodes: There is progressive mediastinal shift to the right related to right-sided volume loss. Visualized thyroid is unremarkable. No pathologic thoracic adenopathy. The esophagus is unremarkable. Small hiatal hernia.   Lungs/Pleura: Complex large, posterobasal loculated right pleural effusion is again identified demonstrating heterogeneous attenuation which may relate to proteinaceous or hemorrhagic debris or progressive pleural soft tissue the setting of mesothelioma or metastatic adenocarcinoma. Evaluation is slightly limited by noncontrast technique. The right lower lobe is completely collapsed with extensive airway impaction. There is extensive airway impaction involving the right middle lobar bronchi with dense consolidation of the right middle lobe and near complete collapse. Mild emphysema. No pleural effusion on the left. No pneumothorax.   Upper Abdomen: Cholelithiasis. Hyperdense gallbladder contents may relate to vicarious excretion of contrast or layering sludge. No superimposed pericholecystic inflammatory change. No acute abnormality.   Musculoskeletal: Degenerative changes are noted throughout the thoracolumbar spine with ankylosis of the vertebral bodies of T5-T12. L3 vertebroplasty has been performed. No acute bone abnormality. No lytic or blastic bone lesion.   IMPRESSION: 1. Enlarging, complex loculated right pleural effusion demonstrating progressive heterogeneous attenuation which may relate to proteinaceous or  hemorrhagic debris or progressive pleural soft tissue the setting of mesothelioma or metastatic adenocarcinoma. Repeat contrast enhanced imaging and/or diagnostic thoracentesis may be helpful for further evaluation. 2. Extensive airway impaction involving the right middle and lower lobes with complete collapse the right lower lobe and dense consolidation and near complete collapse of the right middle lobe. Findings may relate to aspiration and/or acute lobar pneumonia. 3. Mild emphysema. 4. Moderate multi-vessel coronary artery calcification. 5. Cholelithiasis.   Aortic Atherosclerosis (ICD10-I70.0) and Emphysema (ICD10-J43.9).     Electronically Signed   By: Fidela Salisbury M.D.   On: 12/12/2021 03:22         ASSESSMENT/PLAN   Acute on chronic hypoxemic respiratory failure        Due to right pleural effusion with rounded atelectasis and associated compressive atelectasis      - s/p IR evaluation - not a candidate for aspiration of pleural space.       -continue with current antibiotics for empiric pneumonia treatment      -BNP and troponin are within reference range and its unlikely that additional cardiac optimization would help   - he may benefit from thoracic surgery evaluation due to possible fibrothorax with rounded atelectasis and ex-vacuo physiology  Airway impaction of right middle lobe    Will start nebulizer with mucomyst 48m 20%   COPD with centrilobular emphysema  -continue generic COPD care path with  -Rocephin IV  - Prednisone '40mg'$  daily   - spiriva and albuterol with Duoneb    Thank you for allowing me to participate in the care of this patient.   Patient/Family are satisfied with care plan and all questions have been answered.    Provider disclosure: Patient with at least one acute or chronic illness or injury that poses a threat to life or bodily function and is being managed actively during this encounter.  All of the below services have been  performed independently by signing provider:  review of prior documentation from  internal and or external health records.  Review of previous and current lab results.  Interview and comprehensive assessment during patient visit today. Review of current and previous chest radiographs/CT scans. Discussion of management and test interpretation with health care team and patient/family.   This document was prepared using Dragon voice recognition software and may include unintentional dictation errors.     Ottie Glazier, M.D.  Division of Pulmonary & Critical Care Medicine

## 2021-12-12 NOTE — Assessment & Plan Note (Signed)
Patient is on insulin Continue basal insulin with sliding scale coverage

## 2021-12-12 NOTE — Progress Notes (Signed)
PROGRESS NOTE    [name]  XBD:[id number] DOB: 29-Jun-1933 DOA: 12/12/2021 PCP: Baxter Hire, MD  Outpatient Specialists: pulm    Brief Narrative:   From admission h and p Arthur [name] is a 88 y.o. male with medical history significant for HTN, BPH CKD 4, anemia of chronic disease on EPO shots by oncology, insulin-dependent type 2 diabetes, diastolic CHF with history of pleural effusion with last thoracentesis 01/2020, followed, advanced COPD, chronic respiratory failure on home O2 at 2 L, and OSA on CPAP followed by pulmonology, and who at baseline is chronically dyspneic per review of pulmonology notes who presents to the ED with worsening shortness of breath and generalized weakness to where he is unable to ambulate.  He denies fever or chills or chest pain.  Denies pain in the lower extremities and denies swelling beyond his baseline.    Assessment & Plan:   Principal Problem:   Aspiration pneumonia ([place]) Active Problems:   COVID-19 virus infection   Recurrent right pleural effusion   COPD (chronic obstructive pulmonary disease) (HCC)   Frailty   Chronic respiratory failure with hypoxia (HCC)   CKD (chronic kidney disease) stage 4, GFR 15-29 ml/min (HCC)   Anemia in chronic kidney disease (CKD)   Type II diabetes mellitus ([place])   Long term (current) use of insulin (HCC)   Essential hypertension   OSA on CPAP   Generalized weakness  # Covid-19 infection Appears to be mild. Does have hypoxia though think symptoms mediated more by effusion and baseline copd that covid infection - will hold paxlovid and start steroids  # Acute on chronic hypoxic respiratory failure? Formerly on oxygen, daughter says has been off for some time. Here 89% on room air - cont [facility] o2, wean as able  # Generalized weakness Nothing focal - PT/OT consults  # Complex loculated pleural effusion Mildly symptomatic. Recurrent. - IR consulted for drainage, may need chest tube, will discuss w/  them - will also consult Dr. Raul Del of pulmonology - this appears to be community acquired so will stop zosyn, start ceftriazone/flagyl - f/u respiratory panel, blood cultures, sputum culture, urine antigens  # COPD Doesn't appear to be exacerbated - cont home spiriva  # HFpEF With lower extremity edema he says little worse than normal. Bnp wnl - resume home torsemide, may need further diuresis  # HTN Here bp appropriate - cont home amlodipine, statin  # T2DM Glucose appropropriate - SSI, home levemir 4 qhs  # BPH - home flomax  # Anemia, chronic Follows w/ heme. Hgt 8, stable from prior - monitor  # CKD4 Gfr in the 20s which is his baseline - monitor and renally dose meds   DVT prophylaxis: heparin Code Status: dnr Family Communication: daughter updated telephonically 12/11  Level of care: med surg Status is: inpt    Consultants:  IR, pulm  Procedures: pending  Antimicrobials:  Zosyn>ceftriaxone/flagyl    Subjective: Feeling weak  Objective: Vitals:   12/12/21 0230 12/12/21 0400 12/12/21 0523 12/12/21 0752  BP: (!) 160/58 (!) 149/50  (!) 145/118  Pulse: 81 70  72  Resp: (!) 22 19  (!) 22  Temp:   99.3 F (37.4 C) 99.3 F (37.4 C)  TempSrc:   Oral Oral  SpO2: 98% 98%  94%  Weight:       No intake or output data in the 24 hours ending 12/12/21 0829 Filed Weights   12/11/21 2013  Weight: 77.1 kg  Examination:  General exam: Appears calm and comfortable  Respiratory system: decreased at bases, rales at bases, rhonchi Cardiovascular system: S1 & S2 heard, RRR.soft systolic murmur Gastrointestinal system: Abdomen is nondistended, soft and nontender. No organomegaly or masses felt. Normal bowel sounds heard. Central nervous system: Alert and oriented. No focal neurological deficits. Extremities: Symmetric 5 x 5 power. Mod pitting edema to knees Skin: No rashes, lesions or ulcers Psychiatry: Judgement and insight appear normal. Mood &  affect appropriate.     Data Reviewed: I have personally reviewed following labs and imaging studies  CBC: Recent Labs  Lab 12/11/21 2015  WBC 5.1  HGB 8.0*  HCT 27.6*  MCV 98.2  PLT 174*   Basic Metabolic Panel: Recent Labs  Lab 12/11/21 2015  NA 136  K 4.8  CL 108  CO2 21*  GLUCOSE 131*  BUN 73*  CREATININE 2.88*  CALCIUM 7.4*   GFR: Estimated Creatinine Clearance: 17.8 mL/min (A) (by C-G formula based on SCr of 2.88 mg/dL (H)). Liver Function Tests: No results for input(s): "AST", "ALT", "ALKPHOS", "BILITOT", "PROT", "ALBUMIN" in the last 168 hours. No results for input(s): "LIPASE", "AMYLASE" in the last 168 hours. No results for input(s): "AMMONIA" in the last 168 hours. Coagulation Profile: No results for input(s): "INR", "PROTIME" in the last 168 hours. Cardiac Enzymes: No results for input(s): "CKTOTAL", "CKMB", "CKMBINDEX", "TROPONINI" in the last 168 hours. BNP (last 3 results) No results for input(s): "PROBNP" in the last 8760 hours. HbA1C: No results for input(s): "HGBA1C" in the last 72 hours. CBG: Recent Labs  Lab 12/12/21 0516 12/12/21 0748  GLUCAP 155* 132*   Lipid Profile: No results for input(s): "CHOL", "HDL", "LDLCALC", "TRIG", "CHOLHDL", "LDLDIRECT" in the last 72 hours. Thyroid Function Tests: No results for input(s): "TSH", "T4TOTAL", "FREET4", "T3FREE", "THYROIDAB" in the last 72 hours. Anemia Panel: No results for input(s): "VITAMINB12", "FOLATE", "FERRITIN", "TIBC", "IRON", "RETICCTPCT" in the last 72 hours. Urine analysis:    Component Value Date/Time   COLORURINE YELLOW (A) 12/12/2021 0158   APPEARANCEUR HAZY (A) 12/12/2021 0158   LABSPEC 1.013 12/12/2021 0158   PHURINE 5.0 12/12/2021 0158   GLUCOSEU NEGATIVE 12/12/2021 0158   HGBUR SMALL (A) 12/12/2021 0158   BILIRUBINUR NEGATIVE 12/12/2021 0158   KETONESUR NEGATIVE 12/12/2021 0158   PROTEINUR >=300 (A) 12/12/2021 0158   NITRITE NEGATIVE 12/12/2021 0158   LEUKOCYTESUR  NEGATIVE 12/12/2021 0158   Sepsis Labs: '@LABRCNTIP'$ (procalcitonin:4,lacticidven:4)  ) Recent Results (from the past 240 hour(s))  SARS Coronavirus 2 by RT PCR (hospital order, performed in [place] hospital lab) *cepheid single result test* Anterior Nasal Swab     Status: Abnormal   Collection Time: 12/12/21  1:21 AM   Specimen: Anterior Nasal Swab  Result Value Ref Range Status   SARS Coronavirus 2 by RT PCR POSITIVE (A) NEGATIVE Final    Comment: (NOTE) SARS-CoV-2 target nucleic acids are DETECTED  SARS-CoV-2 RNA is generally detectable in upper respiratory specimens  during the acute phase of infection.  Positive results are indicative  of the presence of the identified virus, but do not rule out bacterial infection or co-infection with other pathogens not detected by the test.  Clinical correlation with patient history and  other diagnostic information is necessary to determine patient infection status.  The expected result is negative.  Fact Sheet for Patients:   https://www.patel.info/   Fact Sheet for Healthcare Providers:   https://hall.com/    This test is not yet approved or cleared by the [place]  FDA and  has been authorized for detection and/or diagnosis of SARS-CoV-2 by FDA under an Emergency Use Authorization (EUA).  This EUA will remain in effect (meaning this test can be used) for the duration of  the COVID-19 declaration under Section 564(b)(1)  of the Act, 21 U.S.C. section 360-bbb-3(b)(1), unless the authorization is terminated or revoked sooner.   Performed at [facility], [address], [place], [place] [address]          Radiology Studies: CT Chest Wo Contrast  Result Date: 12/12/2021 CLINICAL DATA:  Pneumonia, complication suspected, xray done EXAM: CT CHEST WITHOUT CONTRAST TECHNIQUE: Multidetector CT imaging of the chest was performed following the standard protocol without IV  contrast. RADIATION DOSE REDUCTION: This exam was performed according to the departmental dose-optimization program which includes automated exposure control, adjustment of the mA and/or kV according to patient size and/or use of iterative reconstruction technique. COMPARISON:  10/07/2018, CT abdomen pelvis 03/04/2019 FINDINGS: Cardiovascular: Moderate multi-vessel coronary artery calcification. Global cardiac size within normal limits. No pericardial effusion. Central pulmonary arteries are enlarged in keeping with changes of pulmonary arterial hypertension. Moderate atherosclerotic calcification within the thoracic aorta. No aortic aneurysm. Mediastinum/Nodes: There is progressive mediastinal shift to the right related to right-sided volume loss. Visualized thyroid is unremarkable. No pathologic thoracic adenopathy. The esophagus is unremarkable. Small hiatal hernia. Lungs/Pleura: Complex large, posterobasal loculated right pleural effusion is again identified demonstrating heterogeneous attenuation which may relate to proteinaceous or hemorrhagic debris or progressive pleural soft tissue the setting of mesothelioma or metastatic adenocarcinoma. Evaluation is slightly limited by noncontrast technique. The right lower lobe is completely collapsed with extensive airway impaction. There is extensive airway impaction involving the right middle lobar bronchi with dense consolidation of the right middle lobe and near complete collapse. Mild emphysema. No pleural effusion on the left. No pneumothorax. Upper Abdomen: Cholelithiasis. Hyperdense gallbladder contents may relate to vicarious excretion of contrast or layering sludge. No superimposed pericholecystic inflammatory change. No acute abnormality. Musculoskeletal: Degenerative changes are noted throughout the thoracolumbar spine with ankylosis of the vertebral bodies of T5-T12. L3 vertebroplasty has been performed. No acute bone abnormality. No lytic or blastic bone  lesion. IMPRESSION: 1. Enlarging, complex loculated right pleural effusion demonstrating progressive heterogeneous attenuation which may relate to proteinaceous or hemorrhagic debris or progressive pleural soft tissue the setting of mesothelioma or metastatic adenocarcinoma. Repeat contrast enhanced imaging and/or diagnostic thoracentesis may be helpful for further evaluation. 2. Extensive airway impaction involving the right middle and lower lobes with complete collapse the right lower lobe and dense consolidation and near complete collapse of the right middle lobe. Findings may relate to aspiration and/or acute lobar pneumonia. 3. Mild emphysema. 4. Moderate multi-vessel coronary artery calcification. 5. Cholelithiasis. Aortic Atherosclerosis (ICD10-I70.0) and Emphysema (ICD10-J43.9). Electronically Signed   By: Fidela Salisbury M.D.   On: 12/12/2021 03:22   DG Chest Portable 1 View  Result Date: 12/12/2021 CLINICAL DATA:  Generalized weakness EXAM: PORTABLE CHEST 1 VIEW COMPARISON:  01/14/2020 FINDINGS: Cardiac shadow is enlarged. Right-sided basilar consolidation is noted similar to that seen on the prior exam. Previous CT imaging has shown loculated pleural effusion in this region as well. The overall appearance is stable. Mild vascular congestion is noted without significant edema. No new focal infiltrate is noted. IMPRESSION: Persistent consolidation and loculated fluid on the right in the base stable from the prior exam. Mild vascular congestion. Electronically Signed   By: Inez Catalina M.D.   On: 12/12/2021 02:43  Scheduled Meds:  albuterol  2 puff Inhalation Q6H   amLODipine  2.5 mg Oral Daily   vitamin C  500 mg Oral Daily   insulin aspart  0-15 Units Subcutaneous Q4H   insulin detemir  4 Units Subcutaneous QHS   nirmatrelvir/ritonavir EUA (renal dosing)  2 tablet Oral BID   pantoprazole  40 mg Oral Daily   [START ON 12/13/2021] tiotropium  18 mcg Inhalation Daily   zinc sulfate   220 mg Oral Daily   Continuous Infusions:  piperacillin-tazobactam (ZOSYN)  IV Stopped (12/12/21 0545)     LOS: 0 days     Desma Maxim, MD [facility]   If 7PM-7AM, please contact night-coverage [email or web address] Password [email or web address] 12/12/2021, 8:29 AM

## 2021-12-12 NOTE — Evaluation (Signed)
Occupational Therapy Evaluation Patient Details Name: Arthur [name] MRN: [id number] DOB: 10/09/1933 Today's Date: 12/12/2021   History of Present Illness Pt is an 88 y.o. male presenting to hospital 12/10 with c/o weakness and SOB; unable to ambulate.  Pt admitted with COVID infection with secondary bacterial infection, aspiration PNA, recurrent R pleural effusion, and frailty.  PMH includes htn, DM, HLD, COPD, chronic respiratory failure on 2 L home O2, OSA on CPAP, L2-3 kyphoplasty.   Clinical Impression   Patient agreeable to OT/PT co-treatment to maximize safety and participation. Pt presenting with decreased independence in self care, balance, functional mobility/transfers, and endurance. Prior to admission, pt lived with daughter and was receiving assistance from daughter/PCA for ADLs/IADLs as needed. Pt was oriented to person, place, time (except year), and general situation. Pt also possibly poor historian 2/2 providing conflicting information during evaluation. Pt currently functioning at Mod-Max A +2 for supine<>sit. Unable to complete log roll technique. He endorsed 8/10 LBP while sitting EOB and deferred standing this date. On 2L O2 via [place] throughout. Pt will benefit from acute OT to increase overall independence in the areas of ADLs and functional mobility in order to safely discharge to next venue of care. Upon hospital discharge, recommend STR to maximize pt safety and return to PLOF.    Recommendations for follow up therapy are one component of a multi-disciplinary discharge planning process, led by the attending physician.  Recommendations may be updated based on patient status, additional functional criteria and insurance authorization.   Follow Up Recommendations  Skilled nursing-short term rehab (<3 hours/day)     Assistance Recommended at Discharge Frequent or constant Supervision/Assistance  Patient can return home with the following Assistance with cooking/housework;Assist  for transportation;Help with stairs or ramp for entrance;Two people to help with bathing/dressing/bathroom;Two people to help with walking and/or transfers    Functional Status Assessment  Patient has had a recent decline in their functional status and demonstrates the ability to make significant improvements in function in a reasonable and predictable amount of time.  Equipment Recommendations  BSC/3in1    Recommendations for Other Services       Precautions / Restrictions Precautions Precautions: Fall Restrictions Weight Bearing Restrictions: No      Mobility Bed Mobility Overal bed mobility: Needs Assistance Bed Mobility: Supine to Sit, Sit to Supine     Supine to sit: Mod assist, Max assist, +2 for physical assistance, HOB elevated Sit to supine: Mod assist, Max assist, +2 for physical assistance   General bed mobility comments: pt instructed in log roll technique for comfort 2/2 endorsing LBP, however, unable to perform rolling to L side    Transfers                   General transfer comment: pt unable to sit long enough (d/t LBP) to attempt transfer      Balance Overall balance assessment: Needs assistance Sitting-balance support: Bilateral upper extremity supported, Feet supported Sitting balance-Leahy Scale: Fair         Standing balance comment: pt deferred 2/2 LBP                           ADL either performed or assessed with clinical judgement   ADL Overall ADL's : Needs assistance/impaired     Grooming: Set up;Sitting               Lower Body Dressing: Maximal assistance;Sitting/lateral leans  Vision Baseline Vision/History: 1 Wears glasses Patient Visual Report: No change from baseline       Perception     Praxis      Pertinent Vitals/Pain Pain Assessment Pain Assessment: 0-10 Pain Score: 3  (3/10 in supine, 8/10 L>R LBP in sitting) Pain Location: LBP Pain Descriptors /  Indicators: Sore, Tender, Guarding, Constant Pain Intervention(s): Limited activity within patient's tolerance, Monitored during session, Repositioned, Other (comment) (RN notified)     Hand Dominance     Extremity/Trunk Assessment Upper Extremity Assessment Upper Extremity Assessment: Generalized weakness   Lower Extremity Assessment Lower Extremity Assessment: Generalized weakness RLE Deficits / Details: at least 3/5 AROM DF/PF; minimal range heelslide in bed d/t low back pain; pt unable to sit long enough on edge of bed to perform R LE MMT RLE: Unable to fully assess due to pain       Communication Communication Communication: HOH   Cognition Arousal/Alertness: Awake/alert Behavior During Therapy: WFL for tasks assessed/performed Overall Cognitive Status: No family/caregiver present to determine baseline cognitive functioning                                 General Comments: Oriented to person, place, time (except year), and general situation.  Pt appearing inconsistent with information during session.     General Comments       Exercises Other Exercises Other Exercises: OT provided education re: role of OT, OT POC, post acute recs, sitting up for all meals, EOB/OOB mobility with assistance, home/fall safety.     Shoulder Instructions      Home Living Family/patient expects to be discharged to:: Private residence Living Arrangements: Children (daughter) Available Help at Discharge: Family;Personal care attendant (pt's daughter works; PCA comes to assist pt when dtr is working; has near Clinical cytogeneticist) Type of Home: House Home Access: [place]: One level     Bathroom Shower/Tub: Teacher, early years/pre: Handicapped height     [place]: Conservation officer, nature (2 wheels);Cane - single point;Tub bench;BSC/3in1;Hand held shower head          Prior Functioning/Environment Prior Level of Function : Needs assist              Mobility Comments: Ambulatory with RW.  No recent falls reported in past 6 months. ADLs Comments: Daughter and PCA assist with ADLs/IADLs as needed, independent with dressing, daughter provides transportation        OT Problem List: Decreased strength;Decreased range of motion;Decreased activity tolerance;Impaired balance (sitting and/or standing);Pain;Cardiopulmonary status limiting activity;Decreased safety awareness      OT Treatment/Interventions: Self-care/ADL training;Therapeutic exercise;Therapeutic activities;Cognitive remediation/compensation;DME and/or AE instruction;Patient/family education;Energy conservation;Balance training    OT Goals(Current goals can be found in the care plan section) Acute Rehab OT Goals Patient Stated Goal: reduce pain OT Goal Formulation: With patient Time For Goal Achievement: 12/26/21 Potential to Achieve Goals: Fair   OT Frequency: Min 2X/week    Co-evaluation PT/OT/SLP Co-Evaluation/Treatment: Yes Reason for Co-Treatment: For patient/therapist safety;To address functional/ADL transfers PT goals addressed during session: Mobility/safety with mobility OT goals addressed during session: ADL's and self-care      AM-PAC OT "6 Clicks" Daily Activity     Outcome Measure Help from another person eating meals?: A Little Help from another person taking care of personal grooming?: A Little Help from another person toileting, which includes using toliet, bedpan, or urinal?: A Lot Help from  another person bathing (including washing, rinsing, drying)?: A Lot Help from another person to put on and taking off regular upper body clothing?: A Little Help from another person to put on and taking off regular lower body clothing?: A Lot 6 Click Score: 15   End of Session Equipment Utilized During Treatment: Oxygen Nurse Communication: Mobility status;Other (comment) (LBP)  Activity Tolerance: Patient limited by pain Patient left: in bed;with call  bell/phone within reach;with bed alarm set  OT Visit Diagnosis: Other abnormalities of gait and mobility (R26.89);Muscle weakness (generalized) (M62.81);Pain Pain - part of body:  (back)                Time: 9150-4136 OT Time Calculation (min): 26 min Charges:  OT General Charges $OT Visit: 1 Visit OT Evaluation $OT Eval Low Complexity: 1 Low  [facility] MS, OTR/L ascom [phone]  12/12/21, 3:44 PM

## 2021-12-12 NOTE — Assessment & Plan Note (Signed)
Enlarging, complex loculated right pleural effusion on CT IR consult for diagnostic thoracentesis Consider pulmonology consult prior to thoracentesis given that effusion is loculated.

## 2021-12-12 NOTE — Assessment & Plan Note (Addendum)
COVID infection with secondary bacterial infection Patient presents with generalized weakness and shortness of breath COVID test positive CT chest showing "extensive airway impaction involving the right middle and lower lobes with complete collapse the right lower lobe and dense consolidation and near complete collapse of the right middle lobe.Findings may relate to aspiration and/or acute lobar pneumonia" Continue Zosyn for suspected bacterial superinfection Paxlovid Aspiration precautions Airborne precautions Consider pulmonology consult Follow blood cultures

## 2021-12-12 NOTE — Telephone Encounter (Signed)
Pt daughter, [name], called and stated pt was admitted into the hospital and requested to cancel appt on Wednesday. Reschedule would be dependant on Dr. Collie Siad requests

## 2021-12-12 NOTE — Assessment & Plan Note (Signed)
O2 requirement is at baseline.  Satting in the high 90s on 2 L

## 2021-12-12 NOTE — Assessment & Plan Note (Signed)
Mild wheezing on exam Continue home Spiriva and DuoNebs as needed

## 2021-12-12 NOTE — Assessment & Plan Note (Signed)
Anemia in CKD Renal function and hemoglobin at baseline Patient gets epoetin shots with hematology

## 2021-12-13 ENCOUNTER — Telehealth: Payer: Self-pay

## 2021-12-13 ENCOUNTER — Other Ambulatory Visit: Payer: Self-pay

## 2021-12-13 ENCOUNTER — Encounter: Payer: Self-pay | Admitting: Obstetrics and Gynecology

## 2021-12-13 DIAGNOSIS — U071 COVID-19: Secondary | ICD-10-CM | POA: Diagnosis not present

## 2021-12-13 DIAGNOSIS — D631 Anemia in chronic kidney disease: Secondary | ICD-10-CM

## 2021-12-13 LAB — BASIC METABOLIC PANEL
Anion gap: 7 (ref 5–15)
BUN: 83 mg/dL — ABNORMAL HIGH (ref 8–23)
CO2: 20 mmol/L — ABNORMAL LOW (ref 22–32)
Calcium: 7.1 mg/dL — ABNORMAL LOW (ref 8.9–10.3)
Chloride: 109 mmol/L (ref 98–111)
Creatinine, Ser: 3.22 mg/dL — ABNORMAL HIGH (ref 0.61–1.24)
GFR, Estimated: 18 mL/min — ABNORMAL LOW (ref >60)
Glucose, Bld: 174 mg/dL — ABNORMAL HIGH (ref 70–99)
Potassium: 4.7 mmol/L (ref 3.5–5.1)
Sodium: 136 mmol/L (ref 135–145)

## 2021-12-13 LAB — CBC
HCT: 25.2 % — ABNORMAL LOW (ref 39.0–52.0)
Hemoglobin: 7.3 g/dL — ABNORMAL LOW (ref 13.0–17.0)
MCH: 28.1 pg (ref 26.0–34.0)
MCHC: 29 g/dL — ABNORMAL LOW (ref 30.0–36.0)
MCV: 96.9 fL (ref 80.0–100.0)
Platelets: 140 10*3/uL — ABNORMAL LOW (ref 150–400)
RBC: 2.6 MIL/uL — ABNORMAL LOW (ref 4.22–5.81)
RDW: 19 % — ABNORMAL HIGH (ref 11.5–15.5)
WBC: 7.9 10*3/uL (ref 4.0–10.5)
nRBC: 0.5 % — ABNORMAL HIGH (ref 0.0–0.2)

## 2021-12-13 LAB — GLUCOSE, CAPILLARY
Glucose-Capillary: 161 mg/dL — ABNORMAL HIGH (ref 70–99)
Glucose-Capillary: 147 mg/dL — ABNORMAL HIGH (ref 70–99)
Glucose-Capillary: 180 mg/dL — ABNORMAL HIGH (ref 70–99)
Glucose-Capillary: 192 mg/dL — ABNORMAL HIGH (ref 70–99)
Glucose-Capillary: 283 mg/dL — ABNORMAL HIGH (ref 70–99)

## 2021-12-13 LAB — LEGIONELLA PNEUMOPHILA SEROGP 1 UR AG: L. pneumophila Serogp 1 Ur Ag: NEGATIVE

## 2021-12-13 MED ORDER — EPOETIN ALFA 40000 UNIT/ML IJ SOLN
60000.0000 [IU] | Freq: Once | INTRAMUSCULAR | Status: AC
  Administered 2021-12-14: 60000 [IU] via SUBCUTANEOUS
  Filled 2021-12-13 (×3): qty 2

## 2021-12-13 MED ORDER — EPOETIN ALFA-EPBX 40000 UNIT/ML IJ SOLN: 60000.0000 [IU] | Freq: Once | INTRAMUSCULAR | Status: DC

## 2021-12-13 MED ORDER — SODIUM CHLORIDE 0.9 % IV SOLN
3.0000 g | Freq: Two times a day (BID) | INTRAVENOUS | Status: DC
  Administered 2021-12-13 – 2021-12-14 (×2): 3 g via INTRAVENOUS
  Filled 2021-12-13 (×2): qty 3
  Filled 2021-12-13: qty 8

## 2021-12-13 NOTE — Progress Notes (Deleted)
Cardiac monitoring discontinued per Dr Lyman Speller order.

## 2021-12-13 NOTE — NC FL2 (Addendum)
[place] LEVEL OF CARE FORM     IDENTIFICATION  Patient Name: Arthur [name] Birthdate: 1933-12-03 Sex: male Admission Date (Current Location): 12/12/2021  [facility] and [place] Number:  Engineering geologist and Address:  [facility], [address] [place], [place] [address]      Provider Number: [id number]  Attending Physician Name and Address:  Gwynne Edinger, MD  Relative Name and Phone Number:  Gwyndolyn Saxon [phone]    Current Level of Care: Hospital Recommended Level of Care: [place] Prior Approval Number:    Date Approved/Denied:   PASRR Number: [id number]  Discharge Plan: SNF    Current Diagnoses: Patient Active Problem List   Diagnosis Date Noted   Recurrent right pleural effusion 12/12/2021   Aspiration pneumonia ([place]) 12/12/2021   OSA on CPAP 12/12/2021   Frailty 12/12/2021   COVID-19 virus infection 12/12/2021   Pleural effusion 12/12/2021   Elevated ferritin level 06/27/2019   Generalized weakness    Stage 3a chronic kidney disease ([place])    Constipation    DNR (do not resuscitate) 03/05/2019   Generalized abdominal pain    Gastroesophageal reflux disease without esophagitis    Status post thoracentesis    Pleural effusion on right 03/04/2019   S/P kyphoplasty 02/18/2019   Goals of care, counseling/discussion 01/16/2019   CKD (chronic kidney disease) stage 4, GFR 15-29 ml/min ([place]) 01/16/2019   CKD stage 4 due to type 2 diabetes mellitus ([place])    Closed compression fracture of second lumbar vertebra ([place])    CKD stage 3 due to type 2 diabetes mellitus ([place])    Chronic respiratory failure with hypoxia (HCC)    Benign prostatic hyperplasia without lower urinary tract symptoms    History of anemia due to chronic kidney disease    Localized edema 10/01/2018   Anemia of chronic kidney failure 08/21/2018   UTI (urinary tract infection) 08/07/2018   Acute on chronic renal failure  ([place]) 08/07/2018   HLD (hyperlipidemia) 07/24/2018   Acute on chronic respiratory failure with hypoxia ([place]) 07/06/2018   Anemia in chronic kidney disease (CKD) 06/27/2018   GI bleeding 06/09/2018   Sepsis secondary to UTI ([place]) 05/23/2018   Cellulitis of right lower extremity 05/22/2018   Long term (current) use of insulin ([place]) 01/22/2015   Type II diabetes mellitus, uncontrolled 08/06/2013   Type II diabetes mellitus ([place]) 10/24/2006   OBESITY 10/24/2006   Essential hypertension 10/24/2006   ALLERGIC RHINITIS 10/24/2006   COPD (chronic obstructive pulmonary disease) ([place]) 10/24/2006   TOBACCO ABUSE, HX OF 10/24/2006   Personal history of nicotine dependence 10/24/2006    Orientation RESPIRATION BLADDER Height & Weight     Self, Place  O2 (2 Liters nasal cannula) External catheter Weight: 170 lb (77.1 kg) Height:  '5\' 5"'$  (165.1 cm)  BEHAVIORAL SYMPTOMS/MOOD NEUROLOGICAL BOWEL NUTRITION STATUS     (memory impairment, follows commands) Continent Diet (dysphagia 3 diet)  AMBULATORY STATUS COMMUNICATION OF NEEDS Skin   Extensive Assist   Skin abrasions, Other (Comment) (abrasion/ecchymosis, abdomen, arm upper bilateral, ecchymosis arm, leg and bilateral buttocks, erythema bilateral buttocks, arm scratches bilateral)                       Personal Care Assistance Level of Assistance  Feeding, Bathing, Dressing Bathing Assistance: Maximum assistance Feeding assistance: Limited assistance Dressing Assistance: Maximum assistance     Functional Limitations Info  Sight, Hearing, Speech Sight Info: Adequate Hearing Info: Adequate  Speech Info: Adequate    SPECIAL CARE FACTORS FREQUENCY  PT (By licensed PT), OT (By licensed OT)     PT Frequency: 5x a week OT Frequency: 5x a week            Contractures      Additional Factors Info  Code Status Code Status Info: DNR             Current Medications (12/13/2021):  This is the current hospital active medication  list Current Facility-Administered Medications  Medication Dose Route Frequency Provider Last Rate Last Admin   acetaminophen (TYLENOL) tablet 650 mg  650 mg Oral Q6H PRN Athena Masse, MD       Or   acetaminophen (TYLENOL) suppository 650 mg  650 mg Rectal Q6H PRN Athena Masse, MD       albuterol (VENTOLIN HFA) 108 (90 Base) MCG/ACT inhaler 2 puff  2 puff Inhalation Q6H Athena Masse, MD   2 puff at 12/13/21 0058   amLODipine (NORVASC) tablet 2.5 mg  2.5 mg Oral Daily Judd Gaudier V, MD   2.5 mg at 12/13/21 1239   Ampicillin-Sulbactam (UNASYN) 3 g in sodium chloride 0.9 % 100 mL IVPB  3 g Intravenous Q12H Darrick Penna, RPH 200 mL/hr at 12/13/21 1238 3 g at 12/13/21 1238   ascorbic acid (VITAMIN C) tablet 500 mg  500 mg Oral Daily Judd Gaudier V, MD   500 mg at 12/13/21 1240   [START ON 12/14/2021] epoetin alfa (EPOGEN) injection 60,000 Units  60,000 Units Subcutaneous Once Earlie Server, MD       heparin injection 5,000 Units  5,000 Units Subcutaneous Q8H Gwynne Edinger, MD   5,000 Units at 12/13/21 1233   HYDROcodone-acetaminophen (NORCO/VICODIN) 5-325 MG per tablet 1-2 tablet  1-2 tablet Oral Q4H PRN Athena Masse, MD   2 tablet at 12/13/21 0304   insulin aspart (novoLOG) injection 0-9 Units  0-9 Units Subcutaneous TID WC Gwynne Edinger, MD   2 Units at 12/13/21 1229   insulin detemir (LEVEMIR) injection 4 Units  4 Units Subcutaneous QHS Judd Gaudier V, MD   4 Units at 12/12/21 2229   ondansetron (ZOFRAN) tablet 4 mg  4 mg Oral Q6H PRN Athena Masse, MD       Or   ondansetron [facility]) injection 4 mg  4 mg Intravenous Q6H PRN Athena Masse, MD       pantoprazole (PROTONIX) EC tablet 40 mg  40 mg Oral Daily Judd Gaudier V, MD   40 mg at 12/13/21 1240   [START ON 12/17/2021] pravastatin (PRAVACHOL) tablet 10 mg  10 mg Oral QHS Wouk, Ailene Rud, MD       predniSONE (DELTASONE) tablet 40 mg  40 mg Oral Q breakfast Gwynne Edinger, MD   40 mg at 12/13/21 1240    tamsulosin (FLOMAX) capsule 0.4 mg  0.4 mg Oral QPM Wouk, Ailene Rud, MD   0.4 mg at 12/12/21 1813   tiotropium (SPIRIVA) inhalation capsule ([facility] use ONLY) 18 mcg  18 mcg Inhalation Daily Wouk, Ailene Rud, MD       zinc sulfate capsule 220 mg  220 mg Oral Daily Athena Masse, MD   220 mg at 12/13/21 1240   Facility-Administered Medications Ordered in Other Encounters  Medication Dose Route Frequency Provider Last Rate Last Admin   0.9 %  sodium chloride infusion   Intravenous Continuous Earlie Server, MD 10 mL/hr at 10/15/20 1452 New Bag  at 10/15/20 1452     Discharge Medications: Please see discharge summary for a list of discharge medications.  Relevant Imaging Results:  Relevant Lab Results:   Additional Information SSN [id number]  Colen Darling, LCSWA

## 2021-12-13 NOTE — TOC Initial Note (Signed)
Transition of Care [facility]) - Initial/Assessment Note    Patient Details  Name: Arthur [name] MRN: [id number] Date of Birth: [birth year]  [facility] CM/SW Contact:    Colen Darling, [place] Phone Number: 12/13/2021, 2:53 PM  Clinical Narrative:                  TOC completed assessment by phone. The patient sees PCP Dr. Harrel Lemon. He uses Product/process development scientist on [employer] [employer] and his daughter drives him to appointments.He does not have home health.       Patient Goals and CMS Choice      Pending  Expected Discharge Plan and Services         Living arrangements for the past 2 months: Single Family Home                                      Prior Living Arrangements/Services Living arrangements for the past 2 months: [place]     Do you feel safe going back to the place where you live?: Yes               Activities of Daily Living Home Assistive Devices/Equipment: Environmental consultant (specify type), Shower chair without back, Reacher, Raised toilet seat with rails, Oxygen ADL Screening (condition at time of admission) Patient's cognitive ability adequate to safely complete daily activities?: Yes Is the patient deaf or have difficulty hearing?: No Does the patient have difficulty seeing, even when wearing glasses/contacts?: No Does the patient have difficulty concentrating, remembering, or making decisions?: No Patient able to express need for assistance with ADLs?: Yes Does the patient have difficulty dressing or bathing?: Yes Independently performs ADLs?: No Communication: Independent Dressing (OT): Needs assistance Is this a change from baseline?: Change from baseline, expected to last <3days Grooming: Needs assistance Is this a change from baseline?: Change from baseline, expected to last <3 days Feeding: Independent Bathing: Needs assistance Is this a change from baseline?: Change from baseline, expected to last <3 days Toileting:  Dependent Is this a change from baseline?: Change from baseline, expected to last <3 days In/Out Bed: Dependent Is this a change from baseline?: Change from baseline, expected to last <3 days Walks in Home: Needs assistance Is this a change from baseline?: Pre-admission baseline Does the patient have difficulty walking or climbing stairs?: Yes Weakness of Legs: Both Weakness of Arms/Hands: Both  Permission Sought/Granted                  Emotional Assessment   Attitude/Demeanor/Rapport: Hostile Affect (typically observed): Defensive Orientation: : Oriented to Self, Oriented to Place, Oriented to  Time, Oriented to Situation      Admission diagnosis:  Pleural effusion [J90] Acute and chronic respiratory failure with hypoxia (HCC) [J96.21] Generalized weakness [R53.1] Recurrent pleural effusion [J90] COVID-19 [U07.1] Patient Active Problem List   Diagnosis Date Noted   Recurrent right pleural effusion 12/12/2021   Aspiration pneumonia ([place]) 12/12/2021   OSA on CPAP 12/12/2021   Frailty 12/12/2021   COVID-19 virus infection 12/12/2021   Pleural effusion 12/12/2021   Elevated ferritin level 06/27/2019   Generalized weakness    Stage 3a chronic kidney disease ([place])    Constipation    DNR (do not resuscitate) 03/05/2019   Generalized abdominal pain    Gastroesophageal reflux disease without esophagitis    Status post thoracentesis    Pleural effusion on  right 03/04/2019   S/P kyphoplasty 02/18/2019   Goals of care, counseling/discussion 01/16/2019   CKD (chronic kidney disease) stage 4, GFR 15-29 ml/min (HCC) 01/16/2019   CKD stage 4 due to type 2 diabetes mellitus ([place])    Closed compression fracture of second lumbar vertebra ([place])    CKD stage 3 due to type 2 diabetes mellitus (HCC)    Chronic respiratory failure with hypoxia (HCC)    Benign prostatic hyperplasia without lower urinary tract symptoms    History of anemia due to chronic kidney disease    Localized  edema 10/01/2018   Anemia of chronic kidney failure 08/21/2018   UTI (urinary tract infection) 08/07/2018   Acute on chronic renal failure ([place]) 08/07/2018   HLD (hyperlipidemia) 07/24/2018   Acute on chronic respiratory failure with hypoxia ([place]) 07/06/2018   Anemia in chronic kidney disease (CKD) 06/27/2018   GI bleeding 06/09/2018   Sepsis secondary to UTI ([place]) 05/23/2018   Cellulitis of right lower extremity 05/22/2018   Long term (current) use of insulin ([place]) 01/22/2015   Type II diabetes mellitus, uncontrolled 08/06/2013   Type II diabetes mellitus ([place]) 10/24/2006   OBESITY 10/24/2006   Essential hypertension 10/24/2006   ALLERGIC RHINITIS 10/24/2006   COPD (chronic obstructive pulmonary disease) ([place]) 10/24/2006   TOBACCO ABUSE, HX OF 10/24/2006   Personal history of nicotine dependence 10/24/2006   PCP:  Baxter Hire, MD Pharmacy:   [facility] [address], [place] - [place] [address] [place] [address] Phone: [phone] Fax: [phone]     Social Determinants of Health (SDOH) Interventions    Readmission Risk Interventions     No data to display

## 2021-12-13 NOTE — Plan of Care (Signed)
  Problem: Activity: Goal: Ability to tolerate increased activity will improve Outcome: Progressing   

## 2021-12-13 NOTE — Progress Notes (Signed)
PULMONOLOGY         Date: 12/13/2021,   MRN# [id number] Arthur [name] [birth year]     AdmissionWeight: 77.1 kg                 CurrentWeight: 77.1 kg  Referring provider: Dr Si Raider   CHIEF COMPLAINT:   Large right pleural effusion   HISTORY OF PRESENT ILLNESS    HPI: Arthur [name] is a 88 y.o. male with medical history significant for HTN, BPH CKD 4, anemia of chronic disease on EPO shots by oncology, insulin-dependent type 2 diabetes, diastolic CHF with history of pleural effusion with last thoracentesis 01/2020, advanced COPD, chronic respiratory failure on home O2 at 2 L, and OSA on CPAP followed by pulmonology, and who at baseline is chronically dyspneic per review of pulmonology notes who presents to the ED with worsening shortness of breath and generalized weakness to where he is unable to ambulate.  He denies fever or chills or chest pain.  Denies pain in the lower extremities and denies swelling beyond his baseline. ED course and data review: BP on arrival 126/47 with mild tachypnea of 22 and O2 sat in the high 90s on home flow rate of 2 L. Labs with normal WBC, hemoglobin at baseline at 8.  BMP with creatinine 2.88 which is his baseline, troponin 12 and BNP 54.  Urinalysis unremarkable.  COVID and flu pending. EKG, personally viewed and interpreted showing sinus at 88 with no acute ST-T wave changes.   CT chest without contrast shows a loculated right pleural effusion, and extensive airway impaction with complete collapse of the right lower lobe and near complete collapse of right middle lobe consistent with aspiration and/or acute lobar pneumonia as further outlined below: IMPRESSION: 1. Enlarging, complex loculated right pleural effusion demonstrating progressive heterogeneous attenuation which may relate to proteinaceous or hemorrhagic debris or progressive pleural soft tissue the setting of mesothelioma or metastatic adenocarcinoma. Repeat contrast enhanced imaging  and/or diagnostic thoracentesis may be helpful for further evaluation. 2. Extensive airway impaction involving the right middle and lower lobes with complete collapse the right lower lobe and dense consolidation and near complete collapse of the right middle lobe. Findings may relate to aspiration and/or acute lobar pneumonia. 3. Mild emphysema. 4. Moderate multi-vessel coronary artery calcification. 5. Cholelithiasis.  Pulmonology consulted for additional evaluation of acute on chronic hypoxemia.    PAST MEDICAL HISTORY   Past Medical History:  Diagnosis Date   Anemia of chronic kidney failure 08/21/2018   Arthritis    lower back   CHF (congestive heart failure) (HCC)    CKD (chronic kidney disease), stage III (HCC)    COPD (chronic obstructive pulmonary disease) ([place])    Diabetes mellitus without complication ([place])    type 2   Dyspnea    when active -wears oxygen 2L via [place]   History of blood transfusion    Hyperlipidemia    Hypertension    OSA on CPAP 12/12/2021   Requires continuous at home supplemental oxygen    2L via    Squamous cell carcinoma in situ 09/17/2012   Right upper nasal dorsum. SCCis arising in [place]   Squamous cell carcinoma of skin 02/03/2020   R lat cheek, [facility]     SURGICAL HISTORY   Past Surgical History:  Procedure Laterality Date   COLONOSCOPY WITH PROPOFOL N/A 06/11/2018   Procedure: COLONOSCOPY WITH PROPOFOL;  Surgeon: Jonathon Bellows, MD;  Location: [facility] ENDOSCOPY;  Service: Gastroenterology;  Laterality: N/A;   COLONOSCOPY WITH PROPOFOL N/A 06/13/2018   Procedure: COLONOSCOPY WITH PROPOFOL;  Surgeon: Jonathon Bellows, MD;  Location: [facility] ENDOSCOPY;  Service: Gastroenterology;  Laterality: N/A;   ESOPHAGOGASTRODUODENOSCOPY Left 06/10/2018   Procedure: ESOPHAGOGASTRODUODENOSCOPY (EGD);  Surgeon: Jonathon Bellows, MD;  Location: [facility] ENDOSCOPY;  Service: Gastroenterology;  Laterality: Left;   EYE SURGERY     removed cataracts   GIVENS CAPSULE STUDY N/A 06/28/2018    Procedure: GIVENS CAPSULE STUDY;  Surgeon: Lucilla Lame, MD;  Location: [facility] ENDOSCOPY;  Service: Endoscopy;  Laterality: N/A;   GIVENS CAPSULE STUDY N/A 06/30/2018   Procedure: GIVENS CAPSULE STUDY;  Surgeon: Jonathon Bellows, MD;  Location: [facility] ENDOSCOPY;  Service: Gastroenterology;  Laterality: N/A;   KYPHOPLASTY N/A 10/31/2018   Procedure: KYPHOPLASTY L2;  Surgeon: [name], MD;  Location: [facility] ORS;  Service: Orthopedics;  Laterality: N/A;   KYPHOPLASTY N/A 12/24/2018   Procedure: KYPHOPLASTY L3;  Surgeon: [name], MD;  Location: [facility] ORS;  Service: Orthopedics;  Laterality: N/A;   KYPHOPLASTY N/A 02/18/2019   Procedure: L2 KYPHOPLASTY;  Surgeon: [name], MD;  Location: [facility] ORS;  Service: Orthopedics;  Laterality: N/A;   RADIOLOGY WITH ANESTHESIA N/A 01/28/2019   Procedure: MRI LUMBER SPINE WITHOUT CONTRAST;  Surgeon: Radiologist, Medication, MD;  Location: [place];  Service: Radiology;  Laterality: N/A;     FAMILY HISTORY   Family History  Problem Relation Age of Onset   COPD Mother    Cancer Father      SOCIAL HISTORY   Social History   Tobacco Use   Smoking status: Former    Packs/day: 2.00    Years: 35.00    Total pack years: 70.00    Types: Cigarettes    Quit date: 11/04/1982    Years since quitting: 39.1   Smokeless tobacco: Current    Types: Chew  Vaping Use   Vaping Use: Never used  Substance Use Topics   Alcohol use: Not Currently   Drug use: No     MEDICATIONS    Home Medication:     Current Medication:  Current Facility-Administered Medications:    acetaminophen (TYLENOL) tablet 650 mg, 650 mg, Oral, Q6H PRN **OR** acetaminophen (TYLENOL) suppository 650 mg, 650 mg, Rectal, Q6H PRN, Athena Masse, MD   albuterol (VENTOLIN HFA) 108 (90 Base) MCG/ACT inhaler 2 puff, 2 puff, Inhalation, Q6H, Judd Gaudier V, MD, 2 puff at 12/13/21 0058   amLODipine (NORVASC) tablet 2.5 mg, 2.5 mg, Oral, Daily, Judd Gaudier V, MD, 2.5 mg at 12/12/21  1011   Ampicillin-Sulbactam (UNASYN) 3 g in sodium chloride 0.9 % 100 mL IVPB, 3 g, Intravenous, Q12H, Darrick Penna, RPH   ascorbic acid (VITAMIN C) tablet 500 mg, 500 mg, Oral, Daily, Judd Gaudier V, MD, 500 mg at 12/12/21 1013   heparin injection 5,000 Units, 5,000 Units, Subcutaneous, Q8H, Wouk, Ailene Rud, MD, 5,000 Units at 12/13/21 0554   HYDROcodone-acetaminophen (NORCO/VICODIN) 5-325 MG per tablet 1-2 tablet, 1-2 tablet, Oral, Q4H PRN, Athena Masse, MD, 2 tablet at 12/13/21 0304   insulin aspart (novoLOG) injection 0-9 Units, 0-9 Units, Subcutaneous, TID WC, Wouk, Ailene Rud, MD, 1 Units at 12/13/21 0919   insulin detemir (LEVEMIR) injection 4 Units, 4 Units, Subcutaneous, QHS, Judd Gaudier V, MD, 4 Units at 12/12/21 2229   ondansetron (ZOFRAN) tablet 4 mg, 4 mg, Oral, Q6H PRN **OR** ondansetron (ZOFRAN) injection 4 mg, 4 mg, Intravenous, Q6H PRN, Athena Masse, MD   pantoprazole (PROTONIX) EC tablet 40 mg,  40 mg, Oral, Daily, Judd Gaudier V, MD, 40 mg at 12/12/21 1012   [START ON 12/17/2021] pravastatin (PRAVACHOL) tablet 10 mg, 10 mg, Oral, QHS, Wouk, Ailene Rud, MD   predniSONE (DELTASONE) tablet 40 mg, 40 mg, Oral, Q breakfast, Wouk, Ailene Rud, MD, 40 mg at 12/12/21 0904   tamsulosin (FLOMAX) capsule 0.4 mg, 0.4 mg, Oral, QPM, Wouk, Ailene Rud, MD, 0.4 mg at 12/12/21 1813   tiotropium (SPIRIVA) inhalation capsule ([facility] use ONLY) 18 mcg, 18 mcg, Inhalation, Daily, Wouk, Ailene Rud, MD   torsemide [facility]) tablet 40 mg, 40 mg, Oral, Daily, Wouk, Ailene Rud, MD, 40 mg at 12/12/21 1012   zinc sulfate capsule 220 mg, 220 mg, Oral, Daily, Judd Gaudier V, MD, 220 mg at 12/12/21 1012  Facility-Administered Medications Ordered in Other Encounters:    0.9 %  sodium chloride infusion, , Intravenous, Continuous, Earlie Server, MD, Last Rate: 10 mL/hr at 10/15/20 1452, New Bag at 10/15/20 1452    ALLERGIES   Codeine     REVIEW OF SYSTEMS    Review of  Systems:  Gen:  Denies  fever, sweats, chills weigh loss  HEENT: Denies blurred vision, double vision, ear pain, eye pain, hearing loss, nose bleeds, sore throat Cardiac:  No dizziness, chest pain or heaviness, chest tightness,edema Resp:   reports dyspnea chronically now worsened Gi: Denies swallowing difficulty, stomach pain, nausea or vomiting, diarrhea, constipation, bowel incontinence Gu:  Denies bladder incontinence, burning urine Ext:   Denies Joint pain, stiffness or swelling Skin: Denies  skin rash, easy bruising or bleeding or hives Endoc:  Denies polyuria, polydipsia , polyphagia or weight change Psych:   Denies depression, insomnia or hallucinations   Other:  All other systems negative   VS: BP (!) 126/47 (BP Location: Right Arm)   Pulse (!) 58   Temp 97.7 F (36.5 C) (Oral)   Resp 18   Ht '5\' 5"'$  (1.651 m)   Wt 77.1 kg   SpO2 99%   BMI 28.29 kg/m      PHYSICAL EXAM    GENERAL:NAD, no fevers, chills, no weakness no fatigue HEAD: Normocephalic, atraumatic.  EYES: Pupils equal, round, reactive to light. Extraocular muscles intact. No scleral icterus.  MOUTH: Moist mucosal membrane. Dentition intact. No abscess noted.  EAR, NOSE, THROAT: Clear without exudates. No external lesions.  NECK: Supple. No thyromegaly. No nodules. No JVD.  PULMONARY: decreased breath sounds on right with mild rhonchi worse at bases bilaterally.  CARDIOVASCULAR: S1 and S2. Regular rate and rhythm. No murmurs, rubs, or gallops. No edema. Pedal pulses 2+ bilaterally.  GASTROINTESTINAL: Soft, nontender, nondistended. No masses. Positive bowel sounds. No hepatosplenomegaly.  MUSCULOSKELETAL: No swelling, clubbing, or edema. Range of motion full in all extremities.  NEUROLOGIC: Cranial nerves II through XII are intact. No gross focal neurological deficits. Sensation intact. Reflexes intact.  SKIN: No ulceration, lesions, rashes, or cyanosis. Skin warm and dry. Turgor intact.  PSYCHIATRIC: Mood,  affect within normal limits. The patient is awake, alert and oriented x 3. Insight, judgment intact.       IMAGING     Narrative & Impression  CLINICAL DATA:  Pneumonia, complication suspected, xray done   EXAM: CT CHEST WITHOUT CONTRAST   TECHNIQUE: Multidetector CT imaging of the chest was performed following the standard protocol without IV contrast.   RADIATION DOSE REDUCTION: This exam was performed according to the departmental dose-optimization program which includes automated exposure control, adjustment of the mA and/or kV according to patient size and/or  use of iterative reconstruction technique.   COMPARISON:  10/07/2018, CT abdomen pelvis 03/04/2019   FINDINGS: Cardiovascular: Moderate multi-vessel coronary artery calcification. Global cardiac size within normal limits. No pericardial effusion. Central pulmonary arteries are enlarged in keeping with changes of pulmonary arterial hypertension. Moderate atherosclerotic calcification within the thoracic aorta. No aortic aneurysm.   Mediastinum/Nodes: There is progressive mediastinal shift to the right related to right-sided volume loss. Visualized thyroid is unremarkable. No pathologic thoracic adenopathy. The esophagus is unremarkable. Small hiatal hernia.   Lungs/Pleura: Complex large, posterobasal loculated right pleural effusion is again identified demonstrating heterogeneous attenuation which may relate to proteinaceous or hemorrhagic debris or progressive pleural soft tissue the setting of mesothelioma or metastatic adenocarcinoma. Evaluation is slightly limited by noncontrast technique. The right lower lobe is completely collapsed with extensive airway impaction. There is extensive airway impaction involving the right middle lobar bronchi with dense consolidation of the right middle lobe and near complete collapse. Mild emphysema. No pleural effusion on the left. No pneumothorax.   Upper Abdomen:  Cholelithiasis. Hyperdense gallbladder contents may relate to vicarious excretion of contrast or layering sludge. No superimposed pericholecystic inflammatory change. No acute abnormality.   Musculoskeletal: Degenerative changes are noted throughout the thoracolumbar spine with ankylosis of the vertebral bodies of T5-T12. L3 vertebroplasty has been performed. No acute bone abnormality. No lytic or blastic bone lesion.   IMPRESSION: 1. Enlarging, complex loculated right pleural effusion demonstrating progressive heterogeneous attenuation which may relate to proteinaceous or hemorrhagic debris or progressive pleural soft tissue the setting of mesothelioma or metastatic adenocarcinoma. Repeat contrast enhanced imaging and/or diagnostic thoracentesis may be helpful for further evaluation. 2. Extensive airway impaction involving the right middle and lower lobes with complete collapse the right lower lobe and dense consolidation and near complete collapse of the right middle lobe. Findings may relate to aspiration and/or acute lobar pneumonia. 3. Mild emphysema. 4. Moderate multi-vessel coronary artery calcification. 5. Cholelithiasis.   Aortic Atherosclerosis (ICD10-I70.0) and Emphysema (ICD10-J43.9).     Electronically Signed   By: Fidela Salisbury M.D.   On: 12/12/2021 03:22         ASSESSMENT/PLAN   Acute on chronic hypoxemic respiratory failure        Due to right pleural effusion with rounded atelectasis and associated compressive atelectasis      - s/p IR evaluation - not a candidate for aspiration of pleural space.       -continue with current antibiotics for empiric pneumonia treatment      -BNP and troponin are within reference range and its unlikely that additional cardiac optimization would help   - he may benefit from thoracic surgery evaluation due to possible fibrothorax with rounded atelectasis and ex-vacuo physiology  Airway impaction of right middle lobe    Will  start nebulizer with mucomyst 41m 20%   COPD with centrilobular emphysema  -continue generic COPD care path with  -Rocephin IV  - Prednisone '40mg'$  daily   - spiriva and albuterol with Duoneb    Thank you for allowing me to participate in the care of this patient.   Patient/Family are satisfied with care plan and all questions have been answered.    Provider disclosure: Patient with at least one acute or chronic illness or injury that poses a threat to life or bodily function and is being managed actively during this encounter.  All of the below services have been performed independently by signing provider:  review of prior documentation from internal and or external health  records.  Review of previous and current lab results.  Interview and comprehensive assessment during patient visit today. Review of current and previous chest radiographs/CT scans. Discussion of management and test interpretation with health care team and patient/family.   This document was prepared using Dragon voice recognition software and may include unintentional dictation errors.     Ottie Glazier, M.D.  Division of Pulmonary & Critical Care Medicine

## 2021-12-13 NOTE — Progress Notes (Signed)
PROGRESS NOTE    Arthur [name]  IRW:[id number] DOB: Jul 22, 1933 DOA: 12/12/2021 PCP: Baxter Hire, MD  Outpatient Specialists: pulm    Brief Narrative:   From admission h and p Arthur [name] is a 88 y.o. male with medical history significant for HTN, BPH CKD 4, anemia of chronic disease on EPO shots by oncology, insulin-dependent type 2 diabetes, diastolic CHF with history of pleural effusion with last thoracentesis 01/2020, followed, advanced COPD, chronic respiratory failure on home O2 at 2 L, and OSA on CPAP followed by pulmonology, and who at baseline is chronically dyspneic per review of pulmonology notes who presents to the ED with worsening shortness of breath and generalized weakness to where he is unable to ambulate.  He denies fever or chills or chest pain.  Denies pain in the lower extremities and denies swelling beyond his baseline.    Assessment & Plan:   Principal Problem:   Aspiration pneumonia ([place]) Active Problems:   COVID-19 virus infection   Recurrent right pleural effusion   COPD (chronic obstructive pulmonary disease) (HCC)   Frailty   Chronic respiratory failure with hypoxia (HCC)   CKD (chronic kidney disease) stage 4, GFR 15-29 ml/min (HCC)   Anemia in chronic kidney disease (CKD)   Type II diabetes mellitus ([place])   Long term (current) use of insulin (HCC)   Essential hypertension   OSA on CPAP   Generalized weakness   Pleural effusion  # Covid-19 infection Appears to be mild. Does have hypoxia though think symptoms mediated more by effusion and baseline copd that covid infection - cont prednisone  # Acute on chronic hypoxic respiratory failure Formerly on oxygen, daughter says has been off for some time. Here 89% on room air - cont [place] o2, wean as able  # Generalized weakness Nothing focal - PT/OT advising SNF, TOC consulted  # Complex loculated pleural effusion Mildly symptomatic. Recurrent, chronic. IR and pulm have evaluated, dont't think  drainage beneficial. We are covering for possible infection w/ abx, on unasyn currently  # COPD Doesn't appear to be exacerbated - cont home spiriva - prednisone as above  # HFpEF With lower extremity edema he says little worse than normal. Bnp wnl - hold home torsemide given up-trending creatinine  # HTN Here bp appropriate - cont home amlodipine, statin  # T2DM Glucose appropropriate - SSI, home levemir 4 qhs  # BPH - home flomax  # Anemia, chronic Follows w/ heme. Hgb 8 on arrival, 7s today, no bleeding. Gets retacrit w/ heme every 2 weeks, next due tomorrow. Discussed w/ [name], she said reasonable to give his retacrit here so will order that for tomorrow, 60,000 units  # CKD4 Gfr in the 82s on arrival which is his baseline, today 76 - will hold home diuretic   DVT prophylaxis: heparin Code Status: dnr Family Communication: daughter updated telephonically 12/12  Level of care: med surg Status is: inpt    Consultants:  IR, pulm  Procedures: pending  Antimicrobials:  Zosyn>ceftriaxone/flagyl>unasyn    Subjective: Feeling weak, fatigued, but says breathing a bit better than yesterday  Objective: Vitals:   12/12/21 2057 12/13/21 0003 12/13/21 0217 12/13/21 0454  BP: (!) 120/49 (!) 121/55 (!) 122/52 (!) 126/47  Pulse: 64 (!) 57 (!) 58 (!) 58  Resp: '13 20 18 18  '$ Temp: 97.6 F (36.4 C)  97.9 F (36.6 C) 97.7 F (36.5 C)  TempSrc: Oral  Oral Oral  SpO2: 100% 100% 100% 99%  Weight:  77.1 kg     Height: '5\' 5"'$  (1.651 m)       Intake/Output Summary (Last 24 hours) at 12/13/2021 1256 Last data filed at 12/13/2021 0641 Gross per 24 hour  Intake 240 ml  Output 1150 ml  Net -910 ml   Filed Weights   12/11/21 2013 12/12/21 2057  Weight: 77.1 kg 77.1 kg    Examination:  General exam: Appears calm and comfortable  Respiratory system: decreased at bases, rales at bases, rhonchi Cardiovascular system: S1 & S2 heard, RRR.soft systolic  murmur Gastrointestinal system: Abdomen is nondistended, soft and nontender. No organomegaly or masses felt. Normal bowel sounds heard. Central nervous system: Alert and oriented. No focal neurological deficits. Extremities: Symmetric 5 x 5 power. Mod pitting edema to knees Skin: No rashes, lesions or ulcers Psychiatry: Judgement and insight appear normal. Mood & affect appropriate.     Data Reviewed: I have personally reviewed following labs and imaging studies  CBC: Recent Labs  Lab 12/11/21 2015 12/13/21 0403  WBC 5.1 7.9  HGB 8.0* 7.3*  HCT 27.6* 25.2*  MCV 98.2 96.9  PLT 147* 517*   Basic Metabolic Panel: Recent Labs  Lab 12/11/21 2015 12/13/21 0403  NA 136 136  K 4.8 4.7  CL 108 109  CO2 21* 20*  GLUCOSE 131* 174*  BUN 73* 83*  CREATININE 2.88* 3.22*  CALCIUM 7.4* 7.1*   GFR: Estimated Creatinine Clearance: 15.8 mL/min (A) (by C-G formula based on SCr of 3.22 mg/dL (H)). Liver Function Tests: No results for input(s): "AST", "ALT", "ALKPHOS", "BILITOT", "PROT", "ALBUMIN" in the last 168 hours. No results for input(s): "LIPASE", "AMYLASE" in the last 168 hours. No results for input(s): "AMMONIA" in the last 168 hours. Coagulation Profile: No results for input(s): "INR", "PROTIME" in the last 168 hours. Cardiac Enzymes: No results for input(s): "CKTOTAL", "CKMB", "CKMBINDEX", "TROPONINI" in the last 168 hours. BNP (last 3 results) No results for input(s): "PROBNP" in the last 8760 hours. HbA1C: Recent Labs    12/12/21 0521  HGBA1C 6.2*   CBG: Recent Labs  Lab 12/12/21 1601 12/12/21 2229 12/13/21 0225 12/13/21 0836 12/13/21 1144  GLUCAP 166* 175* 161* 147* 180*   Lipid Profile: No results for input(s): "CHOL", "HDL", "LDLCALC", "TRIG", "CHOLHDL", "LDLDIRECT" in the last 72 hours. Thyroid Function Tests: No results for input(s): "TSH", "T4TOTAL", "FREET4", "T3FREE", "THYROIDAB" in the last 72 hours. Anemia Panel: No results for input(s):  "VITAMINB12", "FOLATE", "FERRITIN", "TIBC", "IRON", "RETICCTPCT" in the last 72 hours. Urine analysis:    Component Value Date/Time   COLORURINE YELLOW (A) 12/12/2021 0158   APPEARANCEUR HAZY (A) 12/12/2021 0158   LABSPEC 1.013 12/12/2021 0158   PHURINE 5.0 12/12/2021 0158   GLUCOSEU NEGATIVE 12/12/2021 0158   HGBUR SMALL (A) 12/12/2021 0158   BILIRUBINUR NEGATIVE 12/12/2021 0158   KETONESUR NEGATIVE 12/12/2021 0158   PROTEINUR >=300 (A) 12/12/2021 0158   NITRITE NEGATIVE 12/12/2021 0158   LEUKOCYTESUR NEGATIVE 12/12/2021 0158   Sepsis Labs: '@LABRCNTIP'$ (procalcitonin:4,lacticidven:4)  ) Recent Results (from the past 240 hour(s))  SARS Coronavirus 2 by RT PCR (hospital order, performed in [place] [facility] hospital lab) *cepheid single result test* Anterior Nasal Swab     Status: Abnormal   Collection Time: 12/12/21  1:21 AM   Specimen: Anterior Nasal Swab  Result Value Ref Range Status   SARS Coronavirus 2 by RT PCR POSITIVE (A) NEGATIVE Final    Comment: (NOTE) SARS-CoV-2 target nucleic acids are DETECTED  SARS-CoV-2 RNA is generally detectable in upper respiratory specimens  during the acute phase of infection.  Positive results are indicative  of the presence of the identified virus, but do not rule out bacterial infection or co-infection with other pathogens not detected by the test.  Clinical correlation with patient history and  other diagnostic information is necessary to determine patient infection status.  The expected result is negative.  Fact Sheet for Patients:   https://www.patel.info/   Fact Sheet for Healthcare Providers:   https://hall.com/    This test is not yet approved or cleared by the [place] FDA and  has been authorized for detection and/or diagnosis of SARS-CoV-2 by FDA under an Emergency Use Authorization (EUA).  This EUA will remain in effect (meaning this test can be used) for the duration of  the  COVID-19 declaration under Section 564(b)(1)  of the Act, 21 U.S.C. section 360-bbb-3(b)(1), unless the authorization is terminated or revoked sooner.   Performed at [facility], [address] [place], [place] [address]   Resp Panel by RT-PCR (Flu A&B, Covid) Anterior Nasal Swab     Status: Abnormal   Collection Time: 12/12/21  1:21 AM   Specimen: Anterior Nasal Swab  Result Value Ref Range Status   SARS Coronavirus 2 by RT PCR POSITIVE (A) NEGATIVE Final    Comment: (NOTE) SARS-CoV-2 target nucleic acids are DETECTED.  The SARS-CoV-2 RNA is generally detectable in upper respiratory specimens during the acute phase of infection. Positive results are indicative of the presence of the identified virus, but do not rule out bacterial infection or co-infection with other pathogens not detected by the test. Clinical correlation with patient history and other diagnostic information is necessary to determine patient infection status. The expected result is Negative.  Fact Sheet for Patients: EntrepreneurPulse.com.au  Fact Sheet for Healthcare Providers: IncredibleEmployment.be  This test is not yet approved or cleared by the [place] FDA and  has been authorized for detection and/or diagnosis of SARS-CoV-2 by FDA under an Emergency Use Authorization (EUA).  This EUA will remain in effect (meaning this test can be used) for the duration of  the COVID-19 declaration under Section 564(b)(1) of the A ct, 21 U.S.C. section 360bbb-3(b)(1), unless the authorization is terminated or revoked sooner.     Influenza A by PCR NEGATIVE NEGATIVE Final   Influenza B by PCR NEGATIVE NEGATIVE Final    Comment: (NOTE) The Xpert Xpress SARS-CoV-2/FLU/RSV plus assay is intended as an aid in the diagnosis of influenza from Nasopharyngeal swab specimens and should not be used as a sole basis for treatment. Nasal washings and aspirates are  unacceptable for Xpert Xpress SARS-CoV-2/FLU/RSV testing.  Fact Sheet for Patients: EntrepreneurPulse.com.au  Fact Sheet for Healthcare Providers: IncredibleEmployment.be  This test is not yet approved or cleared by the [place] FDA and has been authorized for detection and/or diagnosis of SARS-CoV-2 by FDA under an Emergency Use Authorization (EUA). This EUA will remain in effect (meaning this test can be used) for the duration of the COVID-19 declaration under Section 564(b)(1) of the Act, 21 U.S.C. section 360bbb-3(b)(1), unless the authorization is terminated or revoked.  Performed at [facility], [address] [place], [place] [address]   Blood culture (routine x 2)     Status: None (Preliminary result)   Collection Time: 12/12/21  4:50 AM   Specimen: BLOOD RIGHT ARM  Result Value Ref Range Status   Specimen Description BLOOD RIGHT ARM  Final   Special Requests   Final    BOTTLES DRAWN AEROBIC  AND ANAEROBIC Blood Culture results may not be optimal due to an inadequate volume of blood received in culture bottles   Culture   Final    NO GROWTH < 12 HOURS Performed at [facility], [address] [place], [place] [address]    Report Status PENDING  Incomplete  Blood culture (routine x 2)     Status: None (Preliminary result)   Collection Time: 12/12/21  4:50 AM   Specimen: BLOOD LEFT ARM  Result Value Ref Range Status   Specimen Description BLOOD LEFT ARM  Final   Special Requests   Final    BOTTLES DRAWN AEROBIC AND ANAEROBIC Blood Culture results may not be optimal due to an inadequate volume of blood received in culture bottles   Culture   Final    NO GROWTH < 12 HOURS Performed at [facility], [address], [place], [place] [address]    Report Status PENDING  Incomplete         Radiology Studies: [place] CHEST (PLEURAL EFFUSION)  Result Date: 12/12/2021 CLINICAL DATA:  Loculated pleural  effusion EXAM: CHEST ULTRASOUND COMPARISON:  Multiple priors FINDINGS: Focused sonographic exam of the chest was performed for fluid assessment. In the right chest, there is echogenic debris in the right pleural space with small pockets of fluid. Left chest demonstrates no fluid. IMPRESSION: Chronic/organized echogenic material within the right pleural space with small pockets of fluid. Patient noted to have loculated effusion during last thoracentesis in January 2022. Patient unlikely to receive clinical benefit from thoracentesis or chest tube placement. Electronically Signed   By: Albin Felling M.D.   On: 12/12/2021 12:38   CT Chest Wo Contrast  Result Date: 12/12/2021 CLINICAL DATA:  Pneumonia, complication suspected, xray done EXAM: CT CHEST WITHOUT CONTRAST TECHNIQUE: Multidetector CT imaging of the chest was performed following the standard protocol without IV contrast. RADIATION DOSE REDUCTION: This exam was performed according to the departmental dose-optimization program which includes automated exposure control, adjustment of the mA and/or kV according to patient size and/or use of iterative reconstruction technique. COMPARISON:  10/07/2018, CT abdomen pelvis 03/04/2019 FINDINGS: Cardiovascular: Moderate multi-vessel coronary artery calcification. Global cardiac size within normal limits. No pericardial effusion. Central pulmonary arteries are enlarged in keeping with changes of pulmonary arterial hypertension. Moderate atherosclerotic calcification within the thoracic aorta. No aortic aneurysm. Mediastinum/Nodes: There is progressive mediastinal shift to the right related to right-sided volume loss. Visualized thyroid is unremarkable. No pathologic thoracic adenopathy. The esophagus is unremarkable. Small hiatal hernia. Lungs/Pleura: Complex large, posterobasal loculated right pleural effusion is again identified demonstrating heterogeneous attenuation which may relate to proteinaceous or  hemorrhagic debris or progressive pleural soft tissue the setting of mesothelioma or metastatic adenocarcinoma. Evaluation is slightly limited by noncontrast technique. The right lower lobe is completely collapsed with extensive airway impaction. There is extensive airway impaction involving the right middle lobar bronchi with dense consolidation of the right middle lobe and near complete collapse. Mild emphysema. No pleural effusion on the left. No pneumothorax. Upper Abdomen: Cholelithiasis. Hyperdense gallbladder contents may relate to vicarious excretion of contrast or layering sludge. No superimposed pericholecystic inflammatory change. No acute abnormality. Musculoskeletal: Degenerative changes are noted throughout the thoracolumbar spine with ankylosis of the vertebral bodies of T5-T12. L3 vertebroplasty has been performed. No acute bone abnormality. No lytic or blastic bone lesion. IMPRESSION: 1. Enlarging, complex loculated right pleural effusion demonstrating progressive heterogeneous attenuation which may relate to proteinaceous or hemorrhagic debris or progressive pleural soft tissue the setting  of mesothelioma or metastatic adenocarcinoma. Repeat contrast enhanced imaging and/or diagnostic thoracentesis may be helpful for further evaluation. 2. Extensive airway impaction involving the right middle and lower lobes with complete collapse the right lower lobe and dense consolidation and near complete collapse of the right middle lobe. Findings may relate to aspiration and/or acute lobar pneumonia. 3. Mild emphysema. 4. Moderate multi-vessel coronary artery calcification. 5. Cholelithiasis. Aortic Atherosclerosis (ICD10-I70.0) and Emphysema (ICD10-J43.9). Electronically Signed   By: Fidela Salisbury M.D.   On: 12/12/2021 03:22   DG Chest Portable 1 View  Result Date: 12/12/2021 CLINICAL DATA:  Generalized weakness EXAM: PORTABLE CHEST 1 VIEW COMPARISON:  01/14/2020 FINDINGS: Cardiac shadow is enlarged.  Right-sided basilar consolidation is noted similar to that seen on the prior exam. Previous CT imaging has shown loculated pleural effusion in this region as well. The overall appearance is stable. Mild vascular congestion is noted without significant edema. No new focal infiltrate is noted. IMPRESSION: Persistent consolidation and loculated fluid on the right in the base stable from the prior exam. Mild vascular congestion. Electronically Signed   By: Inez Catalina M.D.   On: 12/12/2021 02:43        Scheduled Meds:  albuterol  2 puff Inhalation Q6H   amLODipine  2.5 mg Oral Daily   vitamin C  500 mg Oral Daily   heparin  5,000 Units Subcutaneous Q8H   insulin aspart  0-9 Units Subcutaneous TID WC   insulin detemir  4 Units Subcutaneous QHS   pantoprazole  40 mg Oral Daily   [START ON 12/17/2021] pravastatin  10 mg Oral QHS   predniSONE  40 mg Oral Q breakfast   tamsulosin  0.4 mg Oral QPM   tiotropium  18 mcg Inhalation Daily   torsemide  40 mg Oral Daily   zinc sulfate  220 mg Oral Daily   Continuous Infusions:  ampicillin-sulbactam (UNASYN) IV 3 g (12/13/21 1238)     LOS: 1 day     Desma Maxim, MD [facility]   If 7PM-7AM, please contact night-coverage [email or web address] Password [facility] 12/13/2021, 12:56 PM

## 2021-12-13 NOTE — Telephone Encounter (Signed)
Arthur [name] can you schedule patient to see [name] on or around 12/27 and notify patient of appointment date and time.

## 2021-12-13 NOTE — Telephone Encounter (Signed)
-----   Message from Earlie Server, MD sent at 12/13/2021  1:08 PM EST ----- He got covid and is admitted. Will be dc tmr.  I ask hospitalist to give him retacrit during admission.   Please schedule him to see me in 2 weeks, around 12/27, lab MD + retacrit. Ok to use currently ordered labs. Add hold tube in case his count is low. Thanks.

## 2021-12-14 ENCOUNTER — Ambulatory Visit: Payer: Medicare HMO

## 2021-12-14 ENCOUNTER — Ambulatory Visit: Payer: Medicare HMO | Admitting: Oncology

## 2021-12-14 ENCOUNTER — Inpatient Hospital Stay: Payer: Medicare HMO | Admitting: Oncology

## 2021-12-14 ENCOUNTER — Inpatient Hospital Stay: Payer: Medicare HMO

## 2021-12-14 ENCOUNTER — Other Ambulatory Visit: Payer: Medicare HMO

## 2021-12-14 DIAGNOSIS — Z7189 Other specified counseling: Secondary | ICD-10-CM

## 2021-12-14 DIAGNOSIS — J9621 Acute and chronic respiratory failure with hypoxia: Secondary | ICD-10-CM | POA: Diagnosis not present

## 2021-12-14 DIAGNOSIS — Z515 Encounter for palliative care: Secondary | ICD-10-CM

## 2021-12-14 DIAGNOSIS — J441 Chronic obstructive pulmonary disease with (acute) exacerbation: Secondary | ICD-10-CM | POA: Diagnosis not present

## 2021-12-14 DIAGNOSIS — Z66 Do not resuscitate: Secondary | ICD-10-CM

## 2021-12-14 DIAGNOSIS — U071 COVID-19: Secondary | ICD-10-CM | POA: Diagnosis not present

## 2021-12-14 LAB — CBC
HCT: 24.3 % — ABNORMAL LOW (ref 39.0–52.0)
Hemoglobin: 7.2 g/dL — ABNORMAL LOW (ref 13.0–17.0)
MCH: 28.2 pg (ref 26.0–34.0)
MCHC: 29.6 g/dL — ABNORMAL LOW (ref 30.0–36.0)
MCV: 95.3 fL (ref 80.0–100.0)
Platelets: 157 10*3/uL (ref 150–400)
RBC: 2.55 MIL/uL — ABNORMAL LOW (ref 4.22–5.81)
RDW: 19.2 % — ABNORMAL HIGH (ref 11.5–15.5)
WBC: 6.9 10*3/uL (ref 4.0–10.5)
nRBC: 0.3 % — ABNORMAL HIGH (ref 0.0–0.2)

## 2021-12-14 LAB — BASIC METABOLIC PANEL
Anion gap: 3 — ABNORMAL LOW (ref 5–15)
BUN: 88 mg/dL — ABNORMAL HIGH (ref 8–23)
CO2: 22 mmol/L (ref 22–32)
Calcium: 6.7 mg/dL — ABNORMAL LOW (ref 8.9–10.3)
Chloride: 110 mmol/L (ref 98–111)
Creatinine, Ser: 3.64 mg/dL — ABNORMAL HIGH (ref 0.61–1.24)
GFR, Estimated: 16 mL/min — ABNORMAL LOW (ref >60)
Glucose, Bld: 281 mg/dL — ABNORMAL HIGH (ref 70–99)
Potassium: 5 mmol/L (ref 3.5–5.1)
Sodium: 135 mmol/L (ref 135–145)

## 2021-12-14 LAB — GLUCOSE, CAPILLARY
Glucose-Capillary: 254 mg/dL — ABNORMAL HIGH (ref 70–99)
Glucose-Capillary: 261 mg/dL — ABNORMAL HIGH (ref 70–99)
Glucose-Capillary: 262 mg/dL — ABNORMAL HIGH (ref 70–99)
Glucose-Capillary: 277 mg/dL — ABNORMAL HIGH (ref 70–99)
Glucose-Capillary: 283 mg/dL — ABNORMAL HIGH (ref 70–99)
Glucose-Capillary: 290 mg/dL — ABNORMAL HIGH (ref 70–99)

## 2021-12-14 MED ORDER — GUAIFENESIN-DM 100-10 MG/5ML PO SYRP
5.0000 mL | ORAL_SOLUTION | ORAL | Status: DC | PRN
  Administered 2021-12-14 – 2021-12-23 (×4): 5 mL via ORAL
  Filled 2021-12-14 (×5): qty 10

## 2021-12-14 MED ORDER — AMOXICILLIN-POT CLAVULANATE 500-125 MG PO TABS
1.0000 | ORAL_TABLET | Freq: Two times a day (BID) | ORAL | Status: DC
  Filled 2021-12-14: qty 1

## 2021-12-14 MED ORDER — AMOXICILLIN-POT CLAVULANATE 500-125 MG PO TABS
1.0000 | ORAL_TABLET | Freq: Three times a day (TID) | ORAL | Status: DC
  Administered 2021-12-14 – 2021-12-16 (×9): 1 via ORAL
  Filled 2021-12-14 (×10): qty 1

## 2021-12-14 MED ORDER — PREDNISONE 20 MG PO TABS
30.0000 mg | ORAL_TABLET | Freq: Every day | ORAL | Status: DC
  Administered 2021-12-14 – 2021-12-18 (×5): 30 mg via ORAL
  Filled 2021-12-14 (×5): qty 1

## 2021-12-14 NOTE — Progress Notes (Signed)
Physical Therapy Treatment Patient Details Name: Arthur [name] MRN: [id number] DOB: June 17, 1933 Today's Date: 12/14/2021   History of Present Illness 88 y.o. male presenting to hospital 12/10 with c/o weakness and SOB; unable to ambulate.  Pt admitted with COVID infection with secondary bacterial infection, aspiration PNA, recurrent R pleural effusion, and frailty.  PMH includes htn, DM, HLD, COPD, chronic respiratory failure on 2 L home O2, OSA on CPAP, h/o multiple kyphoplasties.    PT Comments    Pt pleasant and eager to try to work with PT, however severe c/o pain with essentially all activity.  Even very basic light mobility and exercises caused calling out in pain, need for rest breaks and consistent cuing and reinforcement continue PT and encourage mobility.  Ultimately he was able to get up to standing and take some steps to the recliner, voices feeling good to be out of bed and relatively comfortable in the recliner but again pain a big limiter t/o the session.     Recommendations for follow up therapy are one component of a multi-disciplinary discharge planning process, led by the attending physician.  Recommendations may be updated based on patient status, additional functional criteria and insurance authorization.  Follow Up Recommendations  Skilled nursing-short term rehab (<3 hours/day) Can patient physically be transported by private vehicle: No   Assistance Recommended at Discharge Frequent or constant Supervision/Assistance  Patient can return home with the following Two people to help with walking and/or transfers;Two people to help with bathing/dressing/bathroom;Assistance with cooking/housework;Assist for transportation;Help with stairs or ramp for entrance   Equipment Recommendations  Rolling walker (2 wheels);BSC/3in1;Wheelchair (measurements PT);Wheelchair cushion (measurements PT);Hospital bed    Recommendations for Other Services       Precautions / Restrictions  Precautions Precautions: Fall Restrictions Weight Bearing Restrictions: No     Mobility  Bed Mobility Overal bed mobility: Needs Assistance Bed Mobility: Supine to Sit     Supine to sit: Mod assist, Max assist     General bed mobility comments: Similar to prior sessions LBP is a significant limiter with mobility.  Pt was able to assist with moving LEs toward EOB and to try log roll, in attempts to initiate getting to sitting but with each small, assisted movement he would recoil and c/o too much pain.  Ultimately we decided that PT would heavily assist as his voluntary efforts all ended in severe pain and return to supine.    Transfers Overall transfer level: Needs assistance Equipment used: Rolling walker (2 wheels) Transfers: Sit to/from Stand Sit to Stand: Min assist, From elevated surface (elevated bed 1-2 inches)           General transfer comment: Plenty of cuing for set up (LEs and UEs) and sequencing, with help to initiate he was able to rise and shift weight forward, however needed constant light assist to insure that he did keep his weight forward and effectively use the walker to support himself.  Pt did not control descent back to recliner very well, did not use UEs despite cuing to do so.    Ambulation/Gait Ambulation/Gait assistance: Min assist Gait Distance (Feet): 4 Feet Assistive device: Rolling walker (2 wheels)         General Gait Details: Once upright pt did not report excessive back pain, showed good effort with turning steps from EOB to recliner.  Pt needing light assist to insure keeping hips/weight forward but actually did better than he expected.   Stairs  Wheelchair Mobility    Modified Rankin (Stroke Patients Only)       Balance Overall balance assessment: Needs assistance Sitting-balance support: Bilateral upper extremity supported, Feet supported Sitting balance-Leahy Scale: Fair Sitting balance - Comments: once  settled he did not c/o severe back pain but getting to appropriate sitting position was difficult   Standing balance support: Bilateral upper extremity supported Standing balance-Leahy Scale: Fair Standing balance comment: consistent mild reto lean, light assist to insure he maintained forward and kept COG over BOS                            Cognition Arousal/Alertness: Awake/alert Behavior During Therapy: WFL for tasks assessed/performed Overall Cognitive Status: Within Functional Limits for tasks assessed                                          Exercises      General Comments General comments (skin integrity, edema, etc.): back pain is the biggest limiter for pt's mobility today.  On arrival pt had removed O2, sats in the mid 80s, increased to 90s relatively quickly on 2L.      Pertinent Vitals/Pain Pain Assessment Pain Assessment: 0-10 Pain Score: 9  Pain Location: LBP - pt reports no pain laying flat at rest but calling out in seemly severe pain with essentially any movement.    Home Living                          Prior Function            PT Goals (current goals can now be found in the care plan section) Progress towards PT goals: Progressing toward goals    Frequency    Min 2X/week      PT Plan Current plan remains appropriate    Co-evaluation              AM-PAC PT "6 Clicks" Mobility   Outcome Measure  Help needed turning from your back to your side while in a flat bed without using bedrails?: A Lot Help needed moving from lying on your back to sitting on the side of a flat bed without using bedrails?: A Lot Help needed moving to and from a bed to a chair (including a wheelchair)?: A Lot Help needed standing up from a chair using your arms (e.g., wheelchair or bedside chair)?: A Lot Help needed to walk in hospital room?: A Lot Help needed climbing 3-5 steps with a railing? : Total 6 Click Score: 11     End of Session Equipment Utilized During Treatment: Oxygen Activity Tolerance: Patient limited by pain Patient left: with chair alarm set;with call bell/phone within reach Nurse Communication: Mobility status PT Visit Diagnosis: Other abnormalities of gait and mobility (R26.89);Muscle weakness (generalized) (M62.81);Pain Pain - part of body:  (lumbago)     Time: 4259-5638 PT Time Calculation (min) (ACUTE ONLY): 42 min  Charges:  $Gait Training: 8-22 mins $Therapeutic Activity: 23-37 mins                     Kreg Shropshire, DPT 12/14/2021, 3:31 PM

## 2021-12-14 NOTE — Progress Notes (Signed)
Occupational Therapy Treatment Patient Details Name: Arthur [name] MRN: [id number] DOB: 1933/06/12 Today's Date: 12/14/2021   History of present illness 88 y.o. male presenting to hospital 12/10 with c/o weakness and SOB; unable to ambulate.  Pt admitted with COVID infection with secondary bacterial infection, aspiration PNA, recurrent R pleural effusion, and frailty.  PMH includes htn, DM, HLD, COPD, chronic respiratory failure on 2 L home O2, OSA on CPAP, h/o multiple kyphoplasties.   OT comments  Upon entering session, pt sitting up in recliner and agreeable to OT. Tx session targeted improving tolerance for functional mobility in the setting of ADL tasks. Pt requesting to lay back down in bed 2/2 LBP. He required CGA-Min A +2 to stand from recliner and to take steps toward EOB using RW. Attempting to have pt assist with returning to supine, however, had difficulty bringing legs up onto bed 2/2 pain. Pt on 2L O2 via [place] throughout session. Pt left sitting up in bed with all needs in reach. Set up A provided for self-feeding. LBP was limiting factor during session this date despite being pre-medicated. Pt is making progress toward goal completion. D/C recommendation remains appropriate. OT will continue to follow acutely.    Recommendations for follow up therapy are one component of a multi-disciplinary discharge planning process, led by the attending physician.  Recommendations may be updated based on patient status, additional functional criteria and insurance authorization.    Follow Up Recommendations  Skilled nursing-short term rehab (<3 hours/day)     Assistance Recommended at Discharge Frequent or constant Supervision/Assistance  Patient can return home with the following  Assistance with cooking/housework;Assist for transportation;Help with stairs or ramp for entrance;Two people to help with bathing/dressing/bathroom;Two people to help with walking and/or transfers   Equipment  Recommendations  BSC/3in1    Recommendations for Other Services      Precautions / Restrictions Precautions Precautions: Fall Restrictions Weight Bearing Restrictions: No       Mobility Bed Mobility Overal bed mobility: Needs Assistance Bed Mobility: Sit to Supine       Sit to supine: Max assist         Transfers Overall transfer level: Needs assistance Equipment used: Rolling walker (2 wheels) Transfers: Sit to/from Stand, Bed to chair/wheelchair/BSC Sit to Stand: Min assist, +2 physical assistance, Min guard     Step pivot transfers: Min assist, +2 physical assistance, Min guard     General transfer comment: VC for anterior weight shift & hand placement, Min A provided for RW management     Balance Overall balance assessment: Needs assistance Sitting-balance support: Bilateral upper extremity supported, Feet supported Sitting balance-Leahy Scale: Fair     Standing balance support: Bilateral upper extremity supported Standing balance-Leahy Scale: Fair                             ADL either performed or assessed with clinical judgement   ADL Overall ADL's : Needs assistance/impaired Eating/Feeding: Set up;Bed level                   Lower Body Dressing: Maximal assistance;Sitting/lateral leans Lower Body Dressing Details (indicate cue type and reason): socks                    Extremity/Trunk Assessment Upper Extremity Assessment Upper Extremity Assessment: Generalized weakness   Lower Extremity Assessment Lower Extremity Assessment: Generalized weakness        Vision Baseline Vision/History:  1 Wears glasses Patient Visual Report: No change from baseline     Perception     Praxis      Cognition Arousal/Alertness: Awake/alert Behavior During Therapy: WFL for tasks assessed/performed Overall Cognitive Status: Within Functional Limits for tasks assessed                                           Exercises      Shoulder Instructions       General Comments back pain is the biggest limiter for pt's mobility today.  On arrival pt had removed O2, sats in the mid 80s, increased to 90s relatively quickly on 2L.    Pertinent Vitals/ Pain       Pain Assessment Pain Assessment: Faces Faces Pain Scale: Hurts even more Pain Location: LBP - pt reports no pain laying flat at rest but calling out in seemly severe pain with essentially any movement. Pain Descriptors / Indicators: Sore, Tender, Guarding, Constant Pain Intervention(s): Limited activity within patient's tolerance, Monitored during session, Premedicated before session, Repositioned  Home Living                                          Prior Functioning/Environment              Frequency  Min 2X/week        Progress Toward Goals  OT Goals(current goals can now be found in the care plan section)  Progress towards OT goals: Progressing toward goals  Acute Rehab OT Goals Patient Stated Goal: reduce pain OT Goal Formulation: With patient Time For Goal Achievement: 12/26/21 Potential to Achieve Goals: [place] Discharge plan remains appropriate;Frequency remains appropriate    Co-evaluation                 AM-PAC OT "6 Clicks" Daily Activity     Outcome Measure   Help from another person eating meals?: A Little Help from another person taking care of personal grooming?: A Little Help from another person toileting, which includes using toliet, bedpan, or urinal?: A Lot Help from another person bathing (including washing, rinsing, drying)?: A Lot Help from another person to put on and taking off regular upper body clothing?: A Little Help from another person to put on and taking off regular lower body clothing?: A Lot 6 Click Score: 15    End of Session Equipment Utilized During Treatment: Oxygen;Gait belt;Rolling walker (2 wheels)  OT Visit Diagnosis: Other abnormalities of  gait and mobility (R26.89);Muscle weakness (generalized) (M62.81);Pain   Activity Tolerance Patient limited by pain   Patient Left in bed;with call bell/phone within reach;with bed alarm set   Nurse Communication Mobility status        Time: 5465-0354 OT Time Calculation (min): 20 min  Charges: OT General Charges $OT Visit: 1 Visit OT Treatments $Self Care/Home Management : 8-22 mins  [facility] MS, OTR/L ascom [phone]  12/14/21, 5:40 PM

## 2021-12-14 NOTE — Consult Note (Signed)
Consultation Note Date: 12/14/2021   Patient Name: Arthur [name]  DOB: 19-Dec-1933  MRN: [id number]  Age / Sex: 88 y.o., male  PCP: Baxter Hire, MD Referring Physician: Wyvonnia Dusky, MD  Reason for Consultation: Establishing goals of care  HPI/Patient Profile: 88 y.o. male  with past medical history of HTN, BPH CKD 4, anemia of chronic disease on EPO shots by oncology, insulin-dependent type 2 diabetes, diastolic CHF with history of pleural effusion with last thoracentesis 01/2020, followed, advanced COPD, chronic respiratory failure on home O2 at 2 L, and OSA on CPAP followed by pulmonology, and who at baseline is chronically dyspneic per review of pulmonology notes  admitted on 12/12/2021 with worsening shortness of breath and weakness..  Patient found to have COVID infection.  Patient diagnosed with acute on chronic respiratory failure.  Patient found to have complex loculated pleural effusion and not a candidate for thoracentesis.  PMT consulted to discuss goals of care.  Clinical Assessment and Goals of Care: I have reviewed medical records including EPIC notes, labs and imaging, assessed the patient and then met with patient to discuss diagnosis prognosis, GOC, EOL wishes, disposition and options.  I introduced Palliative Medicine as specialized medical care for people living with serious illness. It focuses on providing relief from the symptoms and stress of a serious illness. The goal is to improve quality of life for both the patient and the family.  Patient is established DNR.  Met with patient at bedside.  He is in excellent spirits and telling jokes immediately.  No family at bedside.  He tells me about his children, grandchildren, and great-grandchildren.  He tells me he feels significantly better than when he was admitted.  He is off oxygen and happy to be.  He is most focused on going home as soon as he can.  Patient tells me at  baseline he needs a lot of assistance from his family at home.  He tells me he uses a walker for ambulation but even with a walker he still needs assistance.  He tells me he has an excellent appetite.  He has no questions or concerns.  He gives me permission to call his daughter and discussed care.  Spoke with daughter [name].  We reviewed patient's condition and role of palliative care.  She tells me patient previously had outpatient palliative but she has not heard from them in many months and is not sure why.  She would be interested in having their support again outpatient.  We discussed patient's discharge plan and [name] would really like for him to go to rehab as she feels he needs more physical therapy before he can come home.  Questions and concerns were addressed. The family was encouraged to call with questions or concerns.  Primary Decision Maker PATIENT -joined by next of kin, seems patient occasionally has some confusion and needs supportive family for medical decision making    SUMMARY OF RECOMMENDATIONS   Goals clear -established DNR Daughter would like for patient to go to rehab postdischarge Will reach out to outpatient palliative team, they were following patient previously  Code Status/Advance Care Planning: DNR  Discharge Planning: [place] for rehab with Palliative care service follow-up      Primary Diagnoses: Present on Admission:  Recurrent right pleural effusion  COPD (chronic obstructive pulmonary disease) (HCC)  Anemia in chronic kidney disease (CKD)  CKD (chronic kidney disease) stage 4, GFR 15-29 ml/min (HCC)  Essential hypertension  Chronic respiratory  failure with hypoxia (HCC)  Pleural effusion   I have reviewed the medical record, interviewed the patient and family, and examined the patient. The following aspects are pertinent.  Past Medical History:  Diagnosis Date   Anemia of chronic kidney failure 08/21/2018   Arthritis    lower  back   CHF (congestive heart failure) (HCC)    CKD (chronic kidney disease), stage III (HCC)    COPD (chronic obstructive pulmonary disease) ([place])    Diabetes mellitus without complication ([place])    type 2   Dyspnea    when active -wears oxygen 2L via [place]   History of blood transfusion    Hyperlipidemia    Hypertension    OSA on CPAP 12/12/2021   Requires continuous at home supplemental oxygen    2L via [place]   Squamous cell carcinoma in situ 09/17/2012   Right upper nasal dorsum. SCCis arising in [place]   Squamous cell carcinoma of skin 02/03/2020   R lat cheek, EDC   Social History   Socioeconomic History   Marital status: Widowed    Spouse name: Not on file   Number of children: Not on file   Years of education: Not on file   Highest education level: Not on file  Occupational History   Not on file  Tobacco Use   Smoking status: Former    Packs/day: 2.00    Years: 35.00    Total pack years: 70.00    Types: Cigarettes    Quit date: 11/04/1982    Years since quitting: 39.1   Smokeless tobacco: Current    Types: Chew  Vaping Use   Vaping Use: Never used  Substance and Sexual Activity   Alcohol use: Not Currently   Drug use: No   Sexual activity: Not Currently  Other Topics Concern   Not on file  Social History Narrative   Not on file   Social Determinants of Health   Financial Resource Strain: Low Risk  (07/12/2018)   Overall Financial Resource Strain (CARDIA)    Difficulty of Paying Living Expenses: Not hard at all  Food Insecurity: No Food Insecurity (12/13/2021)   Hunger Vital Sign    Worried About Running Out of Food in the Last Year: Never true    [place] in the Last Year: Never true  Transportation Needs: No Transportation Needs (12/13/2021)   PRAPARE - Hydrologist (Medical): No    Lack of Transportation (Non-Medical): No  Physical Activity: Unknown (06/27/2018)   Exercise Vital Sign    Days of Exercise per Week: 0 days     Minutes of Exercise per Session: Not on file  Stress: No Stress Concern Present (06/27/2018)   [place]    Feeling of Stress : Not at all  Social Connections: Not on file   Family History  Problem Relation Age of Onset   COPD Mother    Cancer Father    Scheduled Meds:  albuterol  2 puff Inhalation Q6H   amLODipine  2.5 mg Oral Daily   amoxicillin-clavulanate  1 tablet Oral TID   vitamin C  500 mg Oral Daily   epoetin (EPOGEN/PROCRIT) injection  60,000 Units Subcutaneous Once   heparin  5,000 Units Subcutaneous Q8H   insulin aspart  0-9 Units Subcutaneous TID WC   insulin detemir  4 Units Subcutaneous QHS   pantoprazole  40 mg Oral Daily   [START ON  12/17/2021] pravastatin  10 mg Oral QHS   predniSONE  30 mg Oral Q breakfast   tamsulosin  0.4 mg Oral QPM   tiotropium  18 mcg Inhalation Daily   zinc sulfate  220 mg Oral Daily   Continuous Infusions: PRN Meds:.acetaminophen **OR** acetaminophen, HYDROcodone-acetaminophen, ondansetron **OR** ondansetron (ZOFRAN) IV Allergies  Allergen Reactions   Codeine Other (See Comments)    Constipation   Review of Systems  Constitutional:  Positive for activity change.    Physical Exam Constitutional:      General: He is not in acute distress.    Appearance: He is ill-appearing.  Pulmonary:     Effort: Pulmonary effort is normal.  Skin:    General: Skin is warm and dry.  Neurological:     Mental Status: He is oriented to person, place, and time.     Vital Signs: BP (!) 117/50   Pulse 60   Temp 98 F (36.7 C) (Oral)   Resp 20   Ht _0  (1.651 m)   Wt 77.1 kg   SpO2 96%   BMI 28.29 kg/m  Pain Scale: 0-10   Pain Score: Asleep   SpO2: SpO2: 96 % O2 Device:SpO2: 96 % O2 Flow Rate: .O2 Flow Rate (L/min): 2 L/min  IO: Intake/output summary:  Intake/Output Summary (Last 24 hours) at 12/14/2021 1418 Last data filed at 12/14/2021 4037 Gross per 24 hour   Intake 200 ml  Output 900 ml  Net -700 ml    LBM: Last BM Date : 12/11/21 Baseline Weight: Weight: 77.1 kg Most recent weight: Weight: 77.1 kg     Palliative Assessment/Data: PPS 40%    *Please note that this is a verbal dictation therefore any spelling or grammatical errors are due to the "[place] [employer]" system interpretation.  Juel Burrow, DNP, AGNP-C Palliative Medicine Team [phone] Pager: [phone]

## 2021-12-14 NOTE — Progress Notes (Signed)
PROGRESS NOTE    Arthur [name]  UEA:[id number] DOB: 05-21-1933 DOA: 12/12/2021 PCP: Baxter Hire, MD   Assessment & Plan:   Principal Problem:   Aspiration pneumonia [facility]) Active Problems:   COVID-19 virus infection   Recurrent right pleural effusion   COPD (chronic obstructive pulmonary disease) ([place])   Frailty   Chronic respiratory failure with hypoxia (HCC)   CKD (chronic kidney disease) stage 4, GFR 15-29 ml/min (HCC)   Anemia in chronic kidney disease (CKD)   Type II diabetes mellitus ([place])   Long term (current) use of insulin (HCC)   Essential hypertension   OSA on CPAP   Generalized weakness   Pleural effusion  Assessment and Plan: COVID19 infection: continue on steroids, bronchodilators & encourage incentive spirometry. Airborne & contact precautions    Acute on chronic hypoxic respiratory failure: continue on supplemental oxygen and wean back to baseline as tolerated   Generalized weakness: PT/OT recs SNF    Complex loculated pleural effusion: recurrent & chronic. Not a candidate for thoracentesis as per IR.  Continue on augmentin    COPD: continue on prednisone and bronchodilators    Chronic diastolic CHF: appears euvolemic. Holding home dose of torsemide. Monitor I/Os   HTN: continue on amlodipine    DM2: likely poorly controlled. Continue on levemir, SSI w/ accuchecks    BPH: continue on flomax   ACD: likely secondary to CKD. Will transfuse if Hb < 7.0    CKDIV: Cr is labile. Avoid nephrotoxic meds       DVT prophylaxis: heparin  Code Status: DNR Family Communication:  Disposition Plan:  likely d/c to SNF   Level of care: Med-Surg  Status is: Inpatient Remains inpatient appropriate because: waiting for pt to complete 10 day quarantine to be d/c to SNF     Consultants:    Procedures:   Antimicrobials: augmentin    Subjective: Pt c/o malaise   Objective: Vitals:   12/13/21 1639 12/13/21 2012 12/14/21 0419 12/14/21 0753  BP:  (!) 134/47 (!) 130/49 (!) 126/54 (!) 117/50  Pulse: 65 66 65 60  Resp: '17 19 18 20  '$ Temp: 97.6 F (36.4 C) 98.1 F (36.7 C) 98 F (36.7 C)   TempSrc: Oral Oral Oral   SpO2: 99% 95% 95% 96%  Weight:      Height:        Intake/Output Summary (Last 24 hours) at 12/14/2021 0813 Last data filed at 12/14/2021 0644 Gross per 24 hour  Intake 200 ml  Output 900 ml  Net -700 ml   Filed Weights   12/11/21 2013 12/12/21 2057  Weight: 77.1 kg 77.1 kg    Examination:  General exam: Appears calm and comfortable  Respiratory system: decreased breath sounds b/l  Cardiovascular system: S1 & S2+. No rubs, gallops or clicks. Gastrointestinal system: Abdomen is nondistended, soft and nontender. Normal bowel sounds heard. Central nervous system: Alert and awake. Moves all extremities  Psychiatry: Judgement and insight appears at baseline. Normal mood and affect      Data Reviewed: I have personally reviewed following labs and imaging studies  CBC: Recent Labs  Lab 12/11/21 2015 12/13/21 0403 12/14/21 0453  WBC 5.1 7.9 6.9  HGB 8.0* 7.3* 7.2*  HCT 27.6* 25.2* 24.3*  MCV 98.2 96.9 95.3  PLT 147* 140* 478   Basic Metabolic Panel: Recent Labs  Lab 12/11/21 2015 12/13/21 0403 12/14/21 0453  NA 136 136 135  K 4.8 4.7 5.0  CL 108 109 110  CO2  21* 20* 22  GLUCOSE 131* 174* 281*  BUN 73* 83* 88*  CREATININE 2.88* 3.22* 3.64*  CALCIUM 7.4* 7.1* 6.7*   GFR: Estimated Creatinine Clearance: 13.9 mL/min (A) (by C-G formula based on SCr of 3.64 mg/dL (H)). Liver Function Tests: No results for input(s): "AST", "ALT", "ALKPHOS", "BILITOT", "PROT", "ALBUMIN" in the last 168 hours. No results for input(s): "LIPASE", "AMYLASE" in the last 168 hours. No results for input(s): "AMMONIA" in the last 168 hours. Coagulation Profile: No results for input(s): "INR", "PROTIME" in the last 168 hours. Cardiac Enzymes: No results for input(s): "CKTOTAL", "CKMB", "CKMBINDEX", "TROPONINI" in the  last 168 hours. BNP (last 3 results) No results for input(s): "PROBNP" in the last 8760 hours. HbA1C: Recent Labs    12/12/21 0521  HGBA1C 6.2*   CBG: Recent Labs  Lab 12/13/21 1641 12/13/21 2015 12/14/21 0020 12/14/21 0422 12/14/21 0800  GLUCAP 192* 283* 262* 254* 261*   Lipid Profile: No results for input(s): "CHOL", "HDL", "LDLCALC", "TRIG", "CHOLHDL", "LDLDIRECT" in the last 72 hours. Thyroid Function Tests: No results for input(s): "TSH", "T4TOTAL", "FREET4", "T3FREE", "THYROIDAB" in the last 72 hours. Anemia Panel: No results for input(s): "VITAMINB12", "FOLATE", "FERRITIN", "TIBC", "IRON", "RETICCTPCT" in the last 72 hours. Sepsis Labs: Recent Labs  Lab 12/12/21 0118  PROCALCITON 0.80    Recent Results (from the past 240 hour(s))  SARS Coronavirus 2 by RT PCR (hospital order, performed in [facility] hospital lab) *cepheid single result test* Anterior Nasal Swab     Status: Abnormal   Collection Time: 12/12/21  1:21 AM   Specimen: Anterior Nasal Swab  Result Value Ref Range Status   SARS Coronavirus 2 by RT PCR POSITIVE (A) NEGATIVE Final    Comment: (NOTE) SARS-CoV-2 target nucleic acids are DETECTED  SARS-CoV-2 RNA is generally detectable in upper respiratory specimens  during the acute phase of infection.  Positive results are indicative  of the presence of the identified virus, but do not rule out bacterial infection or co-infection with other pathogens not detected by the test.  Clinical correlation with patient history and  other diagnostic information is necessary to determine patient infection status.  The expected result is negative.  Fact Sheet for Patients:   https://www.patel.info/   Fact Sheet for Healthcare Providers:   https://hall.com/    This test is not yet approved or cleared by the [place] FDA and  has been authorized for detection and/or diagnosis of SARS-CoV-2 by FDA under an  Emergency Use Authorization (EUA).  This EUA will remain in effect (meaning this test can be used) for the duration of  the COVID-19 declaration under Section 564(b)(1)  of the Act, 21 U.S.C. section 360-bbb-3(b)(1), unless the authorization is terminated or revoked sooner.   Performed at [facility], [address] [place], [place] [address]   Resp Panel by RT-PCR (Flu A&B, Covid) Anterior Nasal Swab     Status: Abnormal   Collection Time: 12/12/21  1:21 AM   Specimen: Anterior Nasal Swab  Result Value Ref Range Status   SARS Coronavirus 2 by RT PCR POSITIVE (A) NEGATIVE Final    Comment: (NOTE) SARS-CoV-2 target nucleic acids are DETECTED.  The SARS-CoV-2 RNA is generally detectable in upper respiratory specimens during the acute phase of infection. Positive results are indicative of the presence of the identified virus, but do not rule out bacterial infection or co-infection with other pathogens not detected by the test. Clinical correlation with patient history and other diagnostic information is necessary  to determine patient infection status. The expected result is Negative.  Fact Sheet for Patients: EntrepreneurPulse.com.au  Fact Sheet for Healthcare Providers: IncredibleEmployment.be  This test is not yet approved or cleared by the [place] FDA and  has been authorized for detection and/or diagnosis of SARS-CoV-2 by FDA under an Emergency Use Authorization (EUA).  This EUA will remain in effect (meaning this test can be used) for the duration of  the COVID-19 declaration under Section 564(b)(1) of the A ct, 21 U.S.C. section 360bbb-3(b)(1), unless the authorization is terminated or revoked sooner.     Influenza A by PCR NEGATIVE NEGATIVE Final   Influenza B by PCR NEGATIVE NEGATIVE Final    Comment: (NOTE) The Xpert Xpress SARS-CoV-2/FLU/RSV plus assay is intended as an aid in the diagnosis of influenza from  Nasopharyngeal swab specimens and should not be used as a sole basis for treatment. Nasal washings and aspirates are unacceptable for Xpert Xpress SARS-CoV-2/FLU/RSV testing.  Fact Sheet for Patients: EntrepreneurPulse.com.au  Fact Sheet for Healthcare Providers: IncredibleEmployment.be  This test is not yet approved or cleared by the [place] FDA and has been authorized for detection and/or diagnosis of SARS-CoV-2 by FDA under an Emergency Use Authorization (EUA). This EUA will remain in effect (meaning this test can be used) for the duration of the COVID-19 declaration under Section 564(b)(1) of the Act, 21 U.S.C. section 360bbb-3(b)(1), unless the authorization is terminated or revoked.  Performed at [facility], [address] [place], [place] [address]   Blood culture (routine x 2)     Status: None (Preliminary result)   Collection Time: 12/12/21  4:50 AM   Specimen: BLOOD RIGHT ARM  Result Value Ref Range Status   Specimen Description BLOOD RIGHT ARM  Final   Special Requests   Final    BOTTLES DRAWN AEROBIC AND ANAEROBIC Blood Culture results may not be optimal due to an inadequate volume of blood received in culture bottles   Culture   Final    NO GROWTH < 12 HOURS Performed at [facility], [address], [place], [place] [address]    Report Status PENDING  Incomplete  Blood culture (routine x 2)     Status: None (Preliminary result)   Collection Time: 12/12/21  4:50 AM   Specimen: BLOOD LEFT ARM  Result Value Ref Range Status   Specimen Description BLOOD LEFT ARM  Final   Special Requests   Final    BOTTLES DRAWN AEROBIC AND ANAEROBIC Blood Culture results may not be optimal due to an inadequate volume of blood received in culture bottles   Culture   Final    NO GROWTH < 12 HOURS Performed at [facility], [address], [place], [place] [address]    Report Status PENDING  Incomplete          Radiology Studies: [place] CHEST (PLEURAL EFFUSION)  Result Date: 12/12/2021 CLINICAL DATA:  Loculated pleural effusion EXAM: CHEST ULTRASOUND COMPARISON:  Multiple priors FINDINGS: Focused sonographic exam of the chest was performed for fluid assessment. In the right chest, there is echogenic debris in the right pleural space with small pockets of fluid. Left chest demonstrates no fluid. IMPRESSION: Chronic/organized echogenic material within the right pleural space with small pockets of fluid. Patient noted to have loculated effusion during last thoracentesis in January 2022. Patient unlikely to receive clinical benefit from thoracentesis or chest tube placement. Electronically Signed   By: Albin Felling M.D.   On: 12/12/2021 12:38  Scheduled Meds:  albuterol  2 puff Inhalation Q6H   amLODipine  2.5 mg Oral Daily   amoxicillin-clavulanate  1 tablet Oral BID   vitamin C  500 mg Oral Daily   epoetin (EPOGEN/PROCRIT) injection  60,000 Units Subcutaneous Once   heparin  5,000 Units Subcutaneous Q8H   insulin aspart  0-9 Units Subcutaneous TID WC   insulin detemir  4 Units Subcutaneous QHS   pantoprazole  40 mg Oral Daily   [START ON 12/17/2021] pravastatin  10 mg Oral QHS   predniSONE  30 mg Oral Q breakfast   tamsulosin  0.4 mg Oral QPM   tiotropium  18 mcg Inhalation Daily   zinc sulfate  220 mg Oral Daily   Continuous Infusions:   LOS: 2 days    Time spent: 35 mins     Wyvonnia Dusky, MD [facility] Pager [phone]  If 7PM-7AM, please contact night-coverage [email or web address] 12/14/2021, 8:13 AM

## 2021-12-14 NOTE — Inpatient Diabetes Management (Signed)
Inpatient Diabetes Program Recommendations  AACE/ADA: New Consensus Statement on Inpatient Glycemic Control (2015)  Target Ranges:  Prepandial:   less than 140 mg/dL      Peak postprandial:   less than 180 mg/dL (1-2 hours)      Critically ill patients:  140 - 180 mg/dL   Lab Results  Component Value Date   GLUCAP 261 (H) 12/14/2021   HGBA1C 6.2 (H) 12/12/2021    Review of Glycemic Control  Latest Reference Range & Units 12/14/21 00:20 12/14/21 04:22 12/14/21 08:00  Glucose-Capillary 70 - 99 mg/dL 262 (H) 254 (H) 261 (H)  (H): Data is abnormally high  Diabetes history: DM Outpatient Diabetes medications:  Levemir 4 units QD Novolog 4 units TID id >200 mg/dL Current orders for Inpatient glycemic control:  Levemir 4 units QD Novolog 0-9 units TID Prednisone 30 mg QAM  Inpatient Diabetes Program Recommendations:    Might consider:  Levemir 6 units QD while on steroids.    Will continue to follow while inpatient.  Thank you, Reche Dixon, MSN, Between Diabetes Coordinator Inpatient Diabetes Program [phone] (team pager from 8a-5p)

## 2021-12-14 NOTE — Progress Notes (Signed)
PULMONOLOGY         Date: 12/14/2021,   MRN# [id number] TRUST LEH 1933/09/11     AdmissionWeight: 77.1 kg                 CurrentWeight: 77.1 kg  Referring provider: Dr Si Raider   CHIEF COMPLAINT:   Large right pleural effusion   HISTORY OF PRESENT ILLNESS    HPI: Arthur [name] is a 88 y.o. male with medical history significant for HTN, BPH CKD 4, anemia of chronic disease on EPO shots by oncology, insulin-dependent type 2 diabetes, diastolic CHF with history of pleural effusion with last thoracentesis 01/2020, advanced COPD, chronic respiratory failure on home O2 at 2 L, and OSA on CPAP followed by pulmonology, and who at baseline is chronically dyspneic per review of pulmonology notes who presents to the ED with worsening shortness of breath and generalized weakness to where he is unable to ambulate.  He denies fever or chills or chest pain.  Denies pain in the lower extremities and denies swelling beyond his baseline. ED course and data review: BP on arrival 126/47 with mild tachypnea of 22 and O2 sat in the high 90s on home flow rate of 2 L. Labs with normal WBC, hemoglobin at baseline at 8.  BMP with creatinine 2.88 which is his baseline, troponin 12 and BNP 54.  Urinalysis unremarkable.  COVID and flu pending. EKG, personally viewed and interpreted showing sinus at 88 with no acute ST-T wave changes.   CT chest without contrast shows a loculated right pleural effusion, and extensive airway impaction with complete collapse of the right lower lobe and near complete collapse of right middle lobe consistent with aspiration and/or acute lobar pneumonia as further outlined below: IMPRESSION: 1. Enlarging, complex loculated right pleural effusion demonstrating progressive heterogeneous attenuation which may relate to proteinaceous or hemorrhagic debris or progressive pleural soft tissue the setting of mesothelioma or metastatic adenocarcinoma. Repeat contrast enhanced imaging  and/or diagnostic thoracentesis may be helpful for further evaluation. 2. Extensive airway impaction involving the right middle and lower lobes with complete collapse the right lower lobe and dense consolidation and near complete collapse of the right middle lobe. Findings may relate to aspiration and/or acute lobar pneumonia. 3. Mild emphysema. 4. Moderate multi-vessel coronary artery calcification. 5. Cholelithiasis.  Pulmonology consulted for additional evaluation of acute on chronic hypoxemia.    12/13/21- patient has improved overnight, weaning on O2 support. He is making adequate urine and reports less cough and less dyspnea.   12/14/21- patient is now on room air.  He is speaking in full sentences. Currently saturating >96% without supplemental O2.  He is negative 1.6L.  He is stable for PT/OT and dc planning.  He is using inhalers as prescribed. He using IS and flutter.  We have weaned prednisone to '30mg'$  daily and reduced abs to augmentin 500 tid.   PAST MEDICAL HISTORY   Past Medical History:  Diagnosis Date   Anemia of chronic kidney failure 08/21/2018   Arthritis    lower back   CHF (congestive heart failure) (HCC)    CKD (chronic kidney disease), stage III (HCC)    COPD (chronic obstructive pulmonary disease) ([place])    Diabetes mellitus without complication ([place])    type 2   Dyspnea    when active -wears oxygen 2L via [place]   History of blood transfusion    Hyperlipidemia    Hypertension    OSA on CPAP 12/12/2021  Requires continuous at home supplemental oxygen    2L via [place]   Squamous cell carcinoma in situ 09/17/2012   Right upper nasal dorsum. SCCis arising in [place]   Squamous cell carcinoma of skin 02/03/2020   R lat cheek, [facility]     SURGICAL HISTORY   Past Surgical History:  Procedure Laterality Date   COLONOSCOPY WITH PROPOFOL N/A 06/11/2018   Procedure: COLONOSCOPY WITH PROPOFOL;  Surgeon: Jonathon Bellows, MD;  Location: [facility] ENDOSCOPY;  Service: Gastroenterology;   Laterality: N/A;   COLONOSCOPY WITH PROPOFOL N/A 06/13/2018   Procedure: COLONOSCOPY WITH PROPOFOL;  Surgeon: Jonathon Bellows, MD;  Location: [facility] ENDOSCOPY;  Service: Gastroenterology;  Laterality: N/A;   ESOPHAGOGASTRODUODENOSCOPY Left 06/10/2018   Procedure: ESOPHAGOGASTRODUODENOSCOPY (EGD);  Surgeon: Jonathon Bellows, MD;  Location: [facility] ENDOSCOPY;  Service: Gastroenterology;  Laterality: Left;   EYE SURGERY     removed cataracts   GIVENS CAPSULE STUDY N/A 06/28/2018   Procedure: GIVENS CAPSULE STUDY;  Surgeon: Lucilla Lame, MD;  Location: [facility] ENDOSCOPY;  Service: Endoscopy;  Laterality: N/A;   GIVENS CAPSULE STUDY N/A 06/30/2018   Procedure: GIVENS CAPSULE STUDY;  Surgeon: Jonathon Bellows, MD;  Location: [facility] ENDOSCOPY;  Service: Gastroenterology;  Laterality: N/A;   KYPHOPLASTY N/A 10/31/2018   Procedure: KYPHOPLASTY L2;  Surgeon: [name], MD;  Location: [facility] ORS;  Service: Orthopedics;  Laterality: N/A;   KYPHOPLASTY N/A 12/24/2018   Procedure: KYPHOPLASTY L3;  Surgeon: [name], MD;  Location: [facility] ORS;  Service: Orthopedics;  Laterality: N/A;   KYPHOPLASTY N/A 02/18/2019   Procedure: L2 KYPHOPLASTY;  Surgeon: [name], MD;  Location: [facility] ORS;  Service: Orthopedics;  Laterality: N/A;   RADIOLOGY WITH ANESTHESIA N/A 01/28/2019   Procedure: MRI LUMBER SPINE WITHOUT CONTRAST;  Surgeon: Radiologist, Medication, MD;  Location: [place];  Service: Radiology;  Laterality: N/A;     FAMILY HISTORY   Family History  Problem Relation Age of Onset   COPD Mother    Cancer Father      SOCIAL HISTORY   Social History   Tobacco Use   Smoking status: Former    Packs/day: 2.00    Years: 35.00    Total pack years: 70.00    Types: Cigarettes    Quit date: 11/04/1982    Years since quitting: 39.1   Smokeless tobacco: Current    Types: Chew  Vaping Use   Vaping Use: Never used  Substance Use Topics   Alcohol use: Not Currently   Drug use: No     MEDICATIONS    Home Medication:      Current Medication:  Current Facility-Administered Medications:    acetaminophen (TYLENOL) tablet 650 mg, 650 mg, Oral, Q6H PRN **OR** acetaminophen (TYLENOL) suppository 650 mg, 650 mg, Rectal, Q6H PRN, Athena Masse, MD   albuterol (VENTOLIN HFA) 108 (90 Base) MCG/ACT inhaler 2 puff, 2 puff, Inhalation, Q6H, Judd Gaudier V, MD, 2 puff at 12/14/21 0151   amLODipine (NORVASC) tablet 2.5 mg, 2.5 mg, Oral, Daily, Judd Gaudier V, MD, 2.5 mg at 12/13/21 1239   Ampicillin-Sulbactam (UNASYN) 3 g in sodium chloride 0.9 % 100 mL IVPB, 3 g, Intravenous, Q12H, Darrick Penna, RPH, Last Rate: 200 mL/hr at 12/14/21 0151, 3 g at 12/14/21 0151   ascorbic acid (VITAMIN C) tablet 500 mg, 500 mg, Oral, Daily, Judd Gaudier V, MD, 500 mg at 12/13/21 1240   epoetin alfa (EPOGEN) injection 60,000 Units, 60,000 Units, Subcutaneous, Once, Earlie Server, MD   heparin injection 5,000 Units, 5,000 Units, Subcutaneous,  Q8H, Wouk, Ailene Rud, MD, 5,000 Units at 12/14/21 0534   HYDROcodone-acetaminophen (NORCO/VICODIN) 5-325 MG per tablet 1-2 tablet, 1-2 tablet, Oral, Q4H PRN, Athena Masse, MD, 1 tablet at 12/13/21 2220   insulin aspart (novoLOG) injection 0-9 Units, 0-9 Units, Subcutaneous, TID WC, Wouk, Ailene Rud, MD, 2 Units at 12/13/21 1708   insulin detemir (LEVEMIR) injection 4 Units, 4 Units, Subcutaneous, QHS, Athena Masse, MD, 4 Units at 12/13/21 2221   ondansetron (ZOFRAN) tablet 4 mg, 4 mg, Oral, Q6H PRN **OR** ondansetron (ZOFRAN) injection 4 mg, 4 mg, Intravenous, Q6H PRN, Athena Masse, MD   pantoprazole (PROTONIX) EC tablet 40 mg, 40 mg, Oral, Daily, Judd Gaudier V, MD, 40 mg at 12/13/21 1240   [START ON 12/17/2021] pravastatin (PRAVACHOL) tablet 10 mg, 10 mg, Oral, QHS, Wouk, Ailene Rud, MD   predniSONE (DELTASONE) tablet 40 mg, 40 mg, Oral, Q breakfast, Wouk, Ailene Rud, MD, 40 mg at 12/13/21 1240   tamsulosin (FLOMAX) capsule 0.4 mg, 0.4 mg, Oral, QPM, Wouk, Ailene Rud, MD, 0.4 mg  at 12/13/21 1708   tiotropium (SPIRIVA) inhalation capsule ([facility] use ONLY) 18 mcg, 18 mcg, Inhalation, Daily, Wouk, Ailene Rud, MD   zinc sulfate capsule 220 mg, 220 mg, Oral, Daily, Judd Gaudier V, MD, 220 mg at 12/13/21 1240  Facility-Administered Medications Ordered in Other Encounters:    0.9 %  sodium chloride infusion, , Intravenous, Continuous, Earlie Server, MD, Last Rate: 10 mL/hr at 10/15/20 1452, New Bag at 10/15/20 1452    ALLERGIES   Codeine     REVIEW OF SYSTEMS    Review of Systems:  Gen:  Denies  fever, sweats, chills weigh loss  HEENT: Denies blurred vision, double vision, ear pain, eye pain, hearing loss, nose bleeds, sore throat Cardiac:  No dizziness, chest pain or heaviness, chest tightness,edema Resp:   reports dyspnea chronically now worsened Gi: Denies swallowing difficulty, stomach pain, nausea or vomiting, diarrhea, constipation, bowel incontinence Gu:  Denies bladder incontinence, burning urine Ext:   Denies Joint pain, stiffness or swelling Skin: Denies  skin rash, easy bruising or bleeding or hives Endoc:  Denies polyuria, polydipsia , polyphagia or weight change Psych:   Denies depression, insomnia or hallucinations   Other:  All other systems negative   VS: BP (!) 117/50   Pulse 60   Temp 98 F (36.7 C) (Oral)   Resp 20   Ht '5\' 5"'$  (1.651 m)   Wt 77.1 kg   SpO2 96%   BMI 28.29 kg/m      PHYSICAL EXAM    GENERAL:NAD, no fevers, chills, no weakness no fatigue HEAD: Normocephalic, atraumatic.  EYES: Pupils equal, round, reactive to light. Extraocular muscles intact. No scleral icterus.  MOUTH: Moist mucosal membrane. Dentition intact. No abscess noted.  EAR, NOSE, THROAT: Clear without exudates. No external lesions.  NECK: Supple. No thyromegaly. No nodules. No JVD.  PULMONARY: decreased breath sounds on right with mild rhonchi worse at bases bilaterally.  CARDIOVASCULAR: S1 and S2. Regular rate and rhythm. No murmurs, rubs, or  gallops. No edema. Pedal pulses 2+ bilaterally.  GASTROINTESTINAL: Soft, nontender, nondistended. No masses. Positive bowel sounds. No hepatosplenomegaly.  MUSCULOSKELETAL: No swelling, clubbing, or edema. Range of motion full in all extremities.  NEUROLOGIC: Cranial nerves II through XII are intact. No gross focal neurological deficits. Sensation intact. Reflexes intact.  SKIN: No ulceration, lesions, rashes, or cyanosis. Skin warm and dry. Turgor intact.  PSYCHIATRIC: Mood, affect within normal limits. The patient is awake,  alert and oriented x 3. Insight, judgment intact.       IMAGING     Narrative & Impression  CLINICAL DATA:  Pneumonia, complication suspected, xray done   EXAM: CT CHEST WITHOUT CONTRAST   TECHNIQUE: Multidetector CT imaging of the chest was performed following the standard protocol without IV contrast.   RADIATION DOSE REDUCTION: This exam was performed according to the departmental dose-optimization program which includes automated exposure control, adjustment of the mA and/or kV according to patient size and/or use of iterative reconstruction technique.   COMPARISON:  10/07/2018, CT abdomen pelvis 03/04/2019   FINDINGS: Cardiovascular: Moderate multi-vessel coronary artery calcification. Global cardiac size within normal limits. No pericardial effusion. Central pulmonary arteries are enlarged in keeping with changes of pulmonary arterial hypertension. Moderate atherosclerotic calcification within the thoracic aorta. No aortic aneurysm.   Mediastinum/Nodes: There is progressive mediastinal shift to the right related to right-sided volume loss. Visualized thyroid is unremarkable. No pathologic thoracic adenopathy. The esophagus is unremarkable. Small hiatal hernia.   Lungs/Pleura: Complex large, posterobasal loculated right pleural effusion is again identified demonstrating heterogeneous attenuation which may relate to proteinaceous or hemorrhagic  debris or progressive pleural soft tissue the setting of mesothelioma or metastatic adenocarcinoma. Evaluation is slightly limited by noncontrast technique. The right lower lobe is completely collapsed with extensive airway impaction. There is extensive airway impaction involving the right middle lobar bronchi with dense consolidation of the right middle lobe and near complete collapse. Mild emphysema. No pleural effusion on the left. No pneumothorax.   Upper Abdomen: Cholelithiasis. Hyperdense gallbladder contents may relate to vicarious excretion of contrast or layering sludge. No superimposed pericholecystic inflammatory change. No acute abnormality.   Musculoskeletal: Degenerative changes are noted throughout the thoracolumbar spine with ankylosis of the vertebral bodies of T5-T12. L3 vertebroplasty has been performed. No acute bone abnormality. No lytic or blastic bone lesion.   IMPRESSION: 1. Enlarging, complex loculated right pleural effusion demonstrating progressive heterogeneous attenuation which may relate to proteinaceous or hemorrhagic debris or progressive pleural soft tissue the setting of mesothelioma or metastatic adenocarcinoma. Repeat contrast enhanced imaging and/or diagnostic thoracentesis may be helpful for further evaluation. 2. Extensive airway impaction involving the right middle and lower lobes with complete collapse the right lower lobe and dense consolidation and near complete collapse of the right middle lobe. Findings may relate to aspiration and/or acute lobar pneumonia. 3. Mild emphysema. 4. Moderate multi-vessel coronary artery calcification. 5. Cholelithiasis.   Aortic Atherosclerosis (ICD10-I70.0) and Emphysema (ICD10-J43.9).     Electronically Signed   By: Fidela Salisbury M.D.   On: 12/12/2021 03:22         ASSESSMENT/PLAN   Acute on chronic hypoxemic respiratory failure        Due to right pleural effusion with rounded atelectasis and  associated compressive atelectasis      - s/p IR evaluation - not a candidate for aspiration of pleural space.       -continue with current antibiotics for empiric pneumonia treatment      -BNP and troponin are within reference range and its unlikely that additional cardiac optimization would help   - he may benefit from thoracic surgery evaluation due to possible fibrothorax with rounded atelectasis and ex-vacuo physiology -patient has improved overnight  Airway impaction of right middle lobe    Will start nebulizer with mucomyst 72m 20%   COPD with centrilobular emphysema  -continue generic COPD care path with  -Rocephin IV  - Prednisone '40mg'$  daily   - spiriva  and albuterol with Duoneb    Thank you for allowing me to participate in the care of this patient.   Patient/Family are satisfied with care plan and all questions have been answered.    Provider disclosure: Patient with at least one acute or chronic illness or injury that poses a threat to life or bodily function and is being managed actively during this encounter.  All of the below services have been performed independently by signing provider:  review of prior documentation from internal and or external health records.  Review of previous and current lab results.  Interview and comprehensive assessment during patient visit today. Review of current and previous chest radiographs/CT scans. Discussion of management and test interpretation with health care team and patient/family.   This document was prepared using Dragon voice recognition software and may include unintentional dictation errors.     Ottie Glazier, M.D.  Division of Pulmonary & Critical Care Medicine

## 2021-12-14 NOTE — Progress Notes (Addendum)
[facility]) Hospital Liaison note:  Notified by Kathie Rhodes, NP of request for [place] services at SNF. Will continue to follow for disposition.  Please call with any outpatient palliative questions or concerns.  Thank you for the opportunity to participate in this patient's care.  Thank you, Lorelee Market, LPN [facility] Liaison [phone]

## 2021-12-15 DIAGNOSIS — E875 Hyperkalemia: Secondary | ICD-10-CM

## 2021-12-15 DIAGNOSIS — R531 Weakness: Secondary | ICD-10-CM

## 2021-12-15 DIAGNOSIS — J1282 Pneumonia due to coronavirus disease 2019: Secondary | ICD-10-CM

## 2021-12-15 LAB — BASIC METABOLIC PANEL
Anion gap: 3 — ABNORMAL LOW (ref 5–15)
BUN: 91 mg/dL — ABNORMAL HIGH (ref 8–23)
CO2: 24 mmol/L (ref 22–32)
Calcium: 6.5 mg/dL — ABNORMAL LOW (ref 8.9–10.3)
Chloride: 107 mmol/L (ref 98–111)
Creatinine, Ser: 3.44 mg/dL — ABNORMAL HIGH (ref 0.61–1.24)
GFR, Estimated: 17 mL/min — ABNORMAL LOW (ref >60)
Glucose, Bld: 297 mg/dL — ABNORMAL HIGH (ref 70–99)
Potassium: 5.4 mmol/L — ABNORMAL HIGH (ref 3.5–5.1)
Sodium: 134 mmol/L — ABNORMAL LOW (ref 135–145)

## 2021-12-15 LAB — GLUCOSE, CAPILLARY
Glucose-Capillary: 254 mg/dL — ABNORMAL HIGH (ref 70–99)
Glucose-Capillary: 241 mg/dL — ABNORMAL HIGH (ref 70–99)
Glucose-Capillary: 241 mg/dL — ABNORMAL HIGH (ref 70–99)
Glucose-Capillary: 262 mg/dL — ABNORMAL HIGH (ref 70–99)
Glucose-Capillary: 300 mg/dL — ABNORMAL HIGH (ref 70–99)

## 2021-12-15 LAB — POTASSIUM: Potassium: 5.8 mmol/L — ABNORMAL HIGH (ref 3.5–5.1)

## 2021-12-15 LAB — CBC
HCT: 24.1 % — ABNORMAL LOW (ref 39.0–52.0)
Hemoglobin: 7.1 g/dL — ABNORMAL LOW (ref 13.0–17.0)
MCH: 27.8 pg (ref 26.0–34.0)
MCHC: 29.5 g/dL — ABNORMAL LOW (ref 30.0–36.0)
MCV: 94.5 fL (ref 80.0–100.0)
Platelets: 136 10*3/uL — ABNORMAL LOW (ref 150–400)
RBC: 2.55 MIL/uL — ABNORMAL LOW (ref 4.22–5.81)
RDW: 19.2 % — ABNORMAL HIGH (ref 11.5–15.5)
WBC: 6.6 10*3/uL (ref 4.0–10.5)
nRBC: 0.8 % — ABNORMAL HIGH (ref 0.0–0.2)

## 2021-12-15 MED ORDER — SODIUM ZIRCONIUM CYCLOSILICATE 10 G PO PACK
10.0000 g | PACK | Freq: Once | ORAL | Status: AC
  Administered 2021-12-15: 10 g via ORAL
  Filled 2021-12-15: qty 1

## 2021-12-15 NOTE — Progress Notes (Signed)
PULMONOLOGY         Date: 12/15/2021,   MRN# [id number] Arthur [name] 1933-04-13     AdmissionWeight: 77.1 kg                 CurrentWeight: 77.1 kg  Referring provider: Dr Si Raider   CHIEF COMPLAINT:   Large right pleural effusion   HISTORY OF PRESENT ILLNESS    HPI: Arthur [name] is a 88 y.o. male with medical history significant for HTN, BPH CKD 4, anemia of chronic disease on EPO shots by oncology, insulin-dependent type 2 diabetes, diastolic CHF with history of pleural effusion with last thoracentesis 01/2020, advanced COPD, chronic respiratory failure on home O2 at 2 L, and OSA on CPAP followed by pulmonology, and who at baseline is chronically dyspneic per review of pulmonology notes who presents to the ED with worsening shortness of breath and generalized weakness to where he is unable to ambulate.  He denies fever or chills or chest pain.  Denies pain in the lower extremities and denies swelling beyond his baseline. ED course and data review: BP on arrival 126/47 with mild tachypnea of 22 and O2 sat in the high 90s on home flow rate of 2 L. Labs with normal WBC, hemoglobin at baseline at 8.  BMP with creatinine 2.88 which is his baseline, troponin 12 and BNP 54.  Urinalysis unremarkable.  COVID and flu pending. EKG, personally viewed and interpreted showing sinus at 88 with no acute ST-T wave changes.   CT chest without contrast shows a loculated right pleural effusion, and extensive airway impaction with complete collapse of the right lower lobe and near complete collapse of right middle lobe consistent with aspiration and/or acute lobar pneumonia as further outlined below: IMPRESSION: 1. Enlarging, complex loculated right pleural effusion demonstrating progressive heterogeneous attenuation which may relate to proteinaceous or hemorrhagic debris or progressive pleural soft tissue the setting of mesothelioma or metastatic adenocarcinoma. Repeat contrast enhanced imaging  and/or diagnostic thoracentesis may be helpful for further evaluation. 2. Extensive airway impaction involving the right middle and lower lobes with complete collapse the right lower lobe and dense consolidation and near complete collapse of the right middle lobe. Findings may relate to aspiration and/or acute lobar pneumonia. 3. Mild emphysema. 4. Moderate multi-vessel coronary artery calcification. 5. Cholelithiasis.  Pulmonology consulted for additional evaluation of acute on chronic hypoxemia.    12/13/21- patient has improved overnight, weaning on O2 support. He is making adequate urine and reports less cough and less dyspnea.   12/14/21- patient is now on room air.  He is speaking in full sentences. Currently saturating >96% without supplemental O2.  He is negative 1.6L.  He is stable for PT/OT and dc planning.  He is using inhalers as prescribed. He using IS and flutter.  We have weaned prednisone to '30mg'$  daily and reduced abs to augmentin 500 tid.   12/15/21- patient is doing well this am. He has plan for SNF placement.  He is now stable and PCCM will sign off and are available as needed.   PAST MEDICAL HISTORY   Past Medical History:  Diagnosis Date   Anemia of chronic kidney failure 08/21/2018   Arthritis    lower back   CHF (congestive heart failure) (HCC)    CKD (chronic kidney disease), stage III (HCC)    COPD (chronic obstructive pulmonary disease) (HCC)    Diabetes mellitus without complication (HCC)    type 2   Dyspnea  when active -wears oxygen 2L via [place]   History of blood transfusion    Hyperlipidemia    Hypertension    OSA on CPAP 12/12/2021   Requires continuous at home supplemental oxygen    2L via [place]   Squamous cell carcinoma in situ 09/17/2012   Right upper nasal dorsum. SCCis arising in [place]   Squamous cell carcinoma of skin 02/03/2020   R lat cheek, EDC     SURGICAL HISTORY   Past Surgical History:  Procedure Laterality Date   COLONOSCOPY WITH  PROPOFOL N/A 06/11/2018   Procedure: COLONOSCOPY WITH PROPOFOL;  Surgeon: Jonathon Bellows, MD;  Location: [facility] ENDOSCOPY;  Service: Gastroenterology;  Laterality: N/A;   COLONOSCOPY WITH PROPOFOL N/A 06/13/2018   Procedure: COLONOSCOPY WITH PROPOFOL;  Surgeon: Jonathon Bellows, MD;  Location: [facility] ENDOSCOPY;  Service: Gastroenterology;  Laterality: N/A;   ESOPHAGOGASTRODUODENOSCOPY Left 06/10/2018   Procedure: ESOPHAGOGASTRODUODENOSCOPY (EGD);  Surgeon: Jonathon Bellows, MD;  Location: [facility] ENDOSCOPY;  Service: Gastroenterology;  Laterality: Left;   EYE SURGERY     removed cataracts   GIVENS CAPSULE STUDY N/A 06/28/2018   Procedure: GIVENS CAPSULE STUDY;  Surgeon: Lucilla Lame, MD;  Location: [facility] ENDOSCOPY;  Service: Endoscopy;  Laterality: N/A;   GIVENS CAPSULE STUDY N/A 06/30/2018   Procedure: GIVENS CAPSULE STUDY;  Surgeon: Jonathon Bellows, MD;  Location: [facility] ENDOSCOPY;  Service: Gastroenterology;  Laterality: N/A;   KYPHOPLASTY N/A 10/31/2018   Procedure: KYPHOPLASTY L2;  Surgeon: [name], MD;  Location: [facility] ORS;  Service: Orthopedics;  Laterality: N/A;   KYPHOPLASTY N/A 12/24/2018   Procedure: KYPHOPLASTY L3;  Surgeon: [name], MD;  Location: [facility] ORS;  Service: Orthopedics;  Laterality: N/A;   KYPHOPLASTY N/A 02/18/2019   Procedure: L2 KYPHOPLASTY;  Surgeon: [name], MD;  Location: [facility] ORS;  Service: Orthopedics;  Laterality: N/A;   RADIOLOGY WITH ANESTHESIA N/A 01/28/2019   Procedure: MRI LUMBER SPINE WITHOUT CONTRAST;  Surgeon: Radiologist, Medication, MD;  Location: [place];  Service: Radiology;  Laterality: N/A;     FAMILY HISTORY   Family History  Problem Relation Age of Onset   COPD Mother    Cancer Father      SOCIAL HISTORY   Social History   Tobacco Use   Smoking status: Former    Packs/day: 2.00    Years: 35.00    Total pack years: 70.00    Types: Cigarettes    Quit date: 11/04/1982    Years since quitting: 39.1   Smokeless tobacco: Current    Types: Chew  Vaping  Use   Vaping Use: Never used  Substance Use Topics   Alcohol use: Not Currently   Drug use: No     MEDICATIONS    Home Medication:     Current Medication:  Current Facility-Administered Medications:    acetaminophen (TYLENOL) tablet 650 mg, 650 mg, Oral, Q6H PRN **OR** acetaminophen (TYLENOL) suppository 650 mg, 650 mg, Rectal, Q6H PRN, Athena Masse, MD   albuterol (VENTOLIN HFA) 108 (90 Base) MCG/ACT inhaler 2 puff, 2 puff, Inhalation, Q6H, Athena Masse, MD, 2 puff at 12/15/21 0909   amLODipine (NORVASC) tablet 2.5 mg, 2.5 mg, Oral, Daily, Judd Gaudier V, MD, 2.5 mg at 12/15/21 0903   amoxicillin-clavulanate (AUGMENTIN) 500-125 MG per tablet 1 tablet, 1 tablet, Oral, TID, Ottie Glazier, MD, 1 tablet at 12/15/21 1610   ascorbic acid (VITAMIN C) tablet 500 mg, 500 mg, Oral, Daily, Judd Gaudier V, MD, 500 mg at 12/15/21 0903   guaiFENesin-dextromethorphan (ROBITUSSIN DM) 100-10  MG/5ML syrup 5 mL, 5 mL, Oral, Q4H PRN, Wyvonnia Dusky, MD, 5 mL at 12/14/21 2125   heparin injection 5,000 Units, 5,000 Units, Subcutaneous, Q8H, Wouk, Ailene Rud, MD, 5,000 Units at 12/15/21 0533   HYDROcodone-acetaminophen (NORCO/VICODIN) 5-325 MG per tablet 1-2 tablet, 1-2 tablet, Oral, Q4H PRN, Athena Masse, MD, 1 tablet at 12/14/21 1536   insulin aspart (novoLOG) injection 0-9 Units, 0-9 Units, Subcutaneous, TID WC, Wouk, Ailene Rud, MD, 5 Units at 12/15/21 0902   insulin detemir (LEVEMIR) injection 4 Units, 4 Units, Subcutaneous, QHS, Athena Masse, MD, 4 Units at 12/14/21 2126   ondansetron (ZOFRAN) tablet 4 mg, 4 mg, Oral, Q6H PRN **OR** ondansetron (ZOFRAN) injection 4 mg, 4 mg, Intravenous, Q6H PRN, Athena Masse, MD   pantoprazole (PROTONIX) EC tablet 40 mg, 40 mg, Oral, Daily, Judd Gaudier V, MD, 40 mg at 12/15/21 0903   [START ON 12/17/2021] pravastatin (PRAVACHOL) tablet 10 mg, 10 mg, Oral, QHS, Wouk, Ailene Rud, MD   predniSONE (DELTASONE) tablet 30 mg, 30 mg, Oral, Q  breakfast, Ottie Glazier, MD, 30 mg at 12/15/21 0905   tamsulosin (FLOMAX) capsule 0.4 mg, 0.4 mg, Oral, QPM, Wouk, Ailene Rud, MD, 0.4 mg at 12/14/21 1729   tiotropium [facility]) inhalation capsule ([facility] use ONLY) 18 mcg, 18 mcg, Inhalation, Daily, Wouk, Ailene Rud, MD, 18 mcg at 12/15/21 4709   zinc sulfate capsule 220 mg, 220 mg, Oral, Daily, Athena Masse, MD, 220 mg at 12/15/21 6283  Facility-Administered Medications Ordered in Other Encounters:    0.9 %  sodium chloride infusion, , Intravenous, Continuous, Earlie Server, MD, Last Rate: 10 mL/hr at 10/15/20 1452, New Bag at 10/15/20 1452    ALLERGIES   Codeine     REVIEW OF SYSTEMS    Review of Systems:  Gen:  Denies  fever, sweats, chills weigh loss  HEENT: Denies blurred vision, double vision, ear pain, eye pain, hearing loss, nose bleeds, sore throat Cardiac:  No dizziness, chest pain or heaviness, chest tightness,edema Resp:   reports dyspnea chronically now worsened Gi: Denies swallowing difficulty, stomach pain, nausea or vomiting, diarrhea, constipation, bowel incontinence Gu:  Denies bladder incontinence, burning urine Ext:   Denies Joint pain, stiffness or swelling Skin: Denies  skin rash, easy bruising or bleeding or hives Endoc:  Denies polyuria, polydipsia , polyphagia or weight change Psych:   Denies depression, insomnia or hallucinations   Other:  All other systems negative   VS: BP (!) 129/53 (BP Location: Right Arm)   Pulse 63   Temp 98.5 F (36.9 C)   Resp 16   Ht '5\' 5"'$  (1.651 m)   Wt 77.1 kg   SpO2 98%   BMI 28.29 kg/m      PHYSICAL EXAM    GENERAL:NAD, no fevers, chills, no weakness no fatigue HEAD: Normocephalic, atraumatic.  EYES: Pupils equal, round, reactive to light. Extraocular muscles intact. No scleral icterus.  MOUTH: Moist mucosal membrane. Dentition intact. No abscess noted.  EAR, NOSE, THROAT: Clear without exudates. No external lesions.  NECK: Supple. No thyromegaly. No  nodules. No JVD.  PULMONARY: decreased breath sounds on right with mild rhonchi worse at bases bilaterally.  CARDIOVASCULAR: S1 and S2. Regular rate and rhythm. No murmurs, rubs, or gallops. No edema. Pedal pulses 2+ bilaterally.  GASTROINTESTINAL: Soft, nontender, nondistended. No masses. Positive bowel sounds. No hepatosplenomegaly.  MUSCULOSKELETAL: No swelling, clubbing, or edema. Range of motion full in all extremities.  NEUROLOGIC: Cranial nerves II through XII are intact.  No gross focal neurological deficits. Sensation intact. Reflexes intact.  SKIN: No ulceration, lesions, rashes, or cyanosis. Skin warm and dry. Turgor intact.  PSYCHIATRIC: Mood, affect within normal limits. The patient is awake, alert and oriented x 3. Insight, judgment intact.       IMAGING     Narrative & Impression  CLINICAL DATA:  Pneumonia, complication suspected, xray done   EXAM: CT CHEST WITHOUT CONTRAST   TECHNIQUE: Multidetector CT imaging of the chest was performed following the standard protocol without IV contrast.   RADIATION DOSE REDUCTION: This exam was performed according to the departmental dose-optimization program which includes automated exposure control, adjustment of the mA and/or kV according to patient size and/or use of iterative reconstruction technique.   COMPARISON:  10/07/2018, CT abdomen pelvis 03/04/2019   FINDINGS: Cardiovascular: Moderate multi-vessel coronary artery calcification. Global cardiac size within normal limits. No pericardial effusion. Central pulmonary arteries are enlarged in keeping with changes of pulmonary arterial hypertension. Moderate atherosclerotic calcification within the thoracic aorta. No aortic aneurysm.   Mediastinum/Nodes: There is progressive mediastinal shift to the right related to right-sided volume loss. Visualized thyroid is unremarkable. No pathologic thoracic adenopathy. The esophagus is unremarkable. Small hiatal hernia.    Lungs/Pleura: Complex large, posterobasal loculated right pleural effusion is again identified demonstrating heterogeneous attenuation which may relate to proteinaceous or hemorrhagic debris or progressive pleural soft tissue the setting of mesothelioma or metastatic adenocarcinoma. Evaluation is slightly limited by noncontrast technique. The right lower lobe is completely collapsed with extensive airway impaction. There is extensive airway impaction involving the right middle lobar bronchi with dense consolidation of the right middle lobe and near complete collapse. Mild emphysema. No pleural effusion on the left. No pneumothorax.   Upper Abdomen: Cholelithiasis. Hyperdense gallbladder contents may relate to vicarious excretion of contrast or layering sludge. No superimposed pericholecystic inflammatory change. No acute abnormality.   Musculoskeletal: Degenerative changes are noted throughout the thoracolumbar spine with ankylosis of the vertebral bodies of T5-T12. L3 vertebroplasty has been performed. No acute bone abnormality. No lytic or blastic bone lesion.   IMPRESSION: 1. Enlarging, complex loculated right pleural effusion demonstrating progressive heterogeneous attenuation which may relate to proteinaceous or hemorrhagic debris or progressive pleural soft tissue the setting of mesothelioma or metastatic adenocarcinoma. Repeat contrast enhanced imaging and/or diagnostic thoracentesis may be helpful for further evaluation. 2. Extensive airway impaction involving the right middle and lower lobes with complete collapse the right lower lobe and dense consolidation and near complete collapse of the right middle lobe. Findings may relate to aspiration and/or acute lobar pneumonia. 3. Mild emphysema. 4. Moderate multi-vessel coronary artery calcification. 5. Cholelithiasis.   Aortic Atherosclerosis (ICD10-I70.0) and Emphysema (ICD10-J43.9).     Electronically Signed   By:  Fidela Salisbury M.D.   On: 12/12/2021 03:22         ASSESSMENT/PLAN   Acute on chronic hypoxemic respiratory failure        Due to right pleural effusion with rounded atelectasis and associated compressive atelectasis      - s/p IR evaluation - not a candidate for aspiration of pleural space.       -continue with current antibiotics for empiric pneumonia treatment      -BNP and troponin are within reference range and its unlikely that additional cardiac optimization would help   - he may benefit from thoracic surgery evaluation due to possible fibrothorax with rounded atelectasis and ex-vacuo physiology -patient has improved overnight  Airway impaction of right middle lobe  Will start nebulizer with mucomyst 67m 20%   COPD with centrilobular emphysema  -continue generic COPD care path with  -Rocephin IV  - Prednisone '40mg'$  daily   - spiriva and albuterol with Duoneb    Thank you for allowing me to participate in the care of this patient.   Patient/Family are satisfied with care plan and all questions have been answered.    Provider disclosure: Patient with at least one acute or chronic illness or injury that poses a threat to life or bodily function and is being managed actively during this encounter.  All of the below services have been performed independently by signing provider:  review of prior documentation from internal and or external health records.  Review of previous and current lab results.  Interview and comprehensive assessment during patient visit today. Review of current and previous chest radiographs/CT scans. Discussion of management and test interpretation with health care team and patient/family.   This document was prepared using Dragon voice recognition software and may include unintentional dictation errors.     FOttie Glazier M.D.  Division of Pulmonary & Critical Care Medicine

## 2021-12-15 NOTE — TOC Progression Note (Addendum)
Transition of Care [facility]) - Progression Note    Patient Details  Name: Arthur [name] MRN: [id number] Date of Birth: March 03, 1933  Transition of Care [facility]) CM/SW [place], [place] Phone Number: 12/15/2021, 4:38 PM  Clinical Narrative:     TOC spoke to [facility] and they require a ten day quarantine post [place]. The patient can transfer to the SNF on 12/22/21.  [facility] is unable to follow while at [facility].       Expected Discharge Plan and [place] on 12/22/21   Living arrangements for the past 2 months: Single Family Home                                       Social Determinants of Health (SDOH) Interventions    Readmission Risk Interventions     No data to display

## 2021-12-15 NOTE — Inpatient Diabetes Management (Signed)
Inpatient Diabetes Program Recommendations  AACE/ADA: New Consensus Statement on Inpatient Glycemic Control (2015)  Target Ranges:  Prepandial:   less than 140 mg/dL      Peak postprandial:   less than 180 mg/dL (1-2 hours)      Critically ill patients:  140 - 180 mg/dL   Lab Results  Component Value Date   GLUCAP 254 (H) 12/15/2021   HGBA1C 6.2 (H) 12/12/2021    Review of Glycemic Control  Latest Reference Range & Units 12/14/21 08:00 12/14/21 11:54 12/14/21 16:57 12/14/21 20:42 12/15/21 08:31  Glucose-Capillary 70 - 99 mg/dL 261 (H) 290 (H) 283 (H) 277 (H) 254 (H)  (H): Data is abnormally high  Diabetes history: DM Outpatient Diabetes medications:  Levemir 4 units QD Novolog 4 units TID id >200 mg/dL Current orders for Inpatient glycemic control:  Levemir 4 units QD Novolog 0-9 units TID Prednisone 30 mg QAM   Inpatient Diabetes Program Recommendations:     Might consider:   Levemir 6 units QD while on steroids Novolog 2 units TID with meals if consumes at least 50%   Will continue to follow while inpatient.   Thank you, Reche Dixon, MSN, [place] Diabetes Coordinator Inpatient Diabetes Program [phone] (team pager from 8a-5p)

## 2021-12-15 NOTE — Progress Notes (Signed)
PROGRESS NOTE    Arthur [name]  GYI:[id number] DOB: Jun 29, 1933 DOA: 12/12/2021 PCP: Baxter Hire, MD   Assessment & Plan:   Principal Problem:   Aspiration pneumonia [facility]) Active Problems:   COVID-19 virus infection   Recurrent right pleural effusion   COPD (chronic obstructive pulmonary disease) ([place])   Frailty   Chronic respiratory failure with hypoxia (HCC)   CKD (chronic kidney disease) stage 4, GFR 15-29 ml/min (HCC)   Anemia in chronic kidney disease (CKD)   Type II diabetes mellitus ([place])   Long term (current) use of insulin (HCC)   Essential hypertension   OSA on CPAP   Generalized weakness   Pleural effusion  Assessment and Plan: COVID19 infection: continue on steroids, bronchodilators & encourage incentive spirometry. Airborne and contact precautions    Acute on chronic hypoxic respiratory failure: continue on supplemental oxygen and wean back to baseline as tolerated   Generalized weakness: PT/OT recs SNF. [facility] accepted pt but waiting to see if pt has to quarantine for 10 days prior to going to SNF    Hyperkalemia: lokelma x1. Repeat potassium level ordered    Complex loculated pleural effusion: recurrent & chronic. Not a candidate for thoracentesis as per IR.  Continue on augmentin    COPD: continue on prednisone, bronchodilators    Chronic diastolic CHF: appears euvolemic. Holding torsemide. Monitor I/Os    HTN: continue on CCB   DM2: well controlled, HbA1c 6.2. Continue on levemir, SSI w/ accuchecks    BPH: continue on flomax    ACD: likely secondary to CKD. Will transfuse if Hb < 7.0    CKDIV: Cr is labile. Avoid nephrotoxic meds   Thrombocytopenia: etiology unclear, labile.      DVT prophylaxis: heparin  Code Status: DNR Family Communication:  Disposition Plan:  likely d/c to SNF   Level of care: Med-Surg  Status is: Inpatient Remains inpatient appropriate because: has been accepted at [facility] but waiting to find  out if pt was to quarantine for 10 days or not     Consultants:    Procedures:   Antimicrobials: augmentin    Subjective: Pt c/o fatigue   Objective: Vitals:   12/14/21 0753 12/14/21 1659 12/14/21 2002 12/15/21 0445  BP: (!) 117/50 (!) 145/54 (!) 125/53 (!) 123/43  Pulse: 60 69 61 63  Resp: '20 18 16 20  '$ Temp:  98.2 F (36.8 C) 97.7 F (36.5 C) 98.2 F (36.8 C)  TempSrc:  Oral    SpO2: 96% 94% 100% 95%  Weight:      Height:        Intake/Output Summary (Last 24 hours) at 12/15/2021 0751 Last data filed at 12/15/2021 0313 Gross per 24 hour  Intake --  Output 800 ml  Net -800 ml   Filed Weights   12/11/21 2013 12/12/21 2057  Weight: 77.1 kg 77.1 kg    Examination:  General exam: Appears comfortable  Respiratory system: diminished breath sounds b/l  Cardiovascular system: S1/S2+. No rubs or clicks  Gastrointestinal system: abd is soft, NT, ND & hypoactive bowel sounds  Central nervous system: alert and awake. Moves all extremities  Psychiatry: Judgement and insight appears at baseline. Appropriate mood and affect       Data Reviewed: I have personally reviewed following labs and imaging studies  CBC: Recent Labs  Lab 12/11/21 2015 12/13/21 0403 12/14/21 0453 12/15/21 0325  WBC 5.1 7.9 6.9 6.6  HGB 8.0* 7.3* 7.2* 7.1*  HCT 27.6* 25.2* 24.3*  24.1*  MCV 98.2 96.9 95.3 94.5  PLT 147* 140* 157 314*   Basic Metabolic Panel: Recent Labs  Lab 12/11/21 2015 12/13/21 0403 12/14/21 0453 12/15/21 0325  NA 136 136 135 134*  K 4.8 4.7 5.0 5.4*  CL 108 109 110 107  CO2 21* 20* 22 24  GLUCOSE 131* 174* 281* 297*  BUN 73* 83* 88* 91*  CREATININE 2.88* 3.22* 3.64* 3.44*  CALCIUM 7.4* 7.1* 6.7* 6.5*   GFR: Estimated Creatinine Clearance: 14.8 mL/min (A) (by C-G formula based on SCr of 3.44 mg/dL (H)). Liver Function Tests: No results for input(s): "AST", "ALT", "ALKPHOS", "BILITOT", "PROT", "ALBUMIN" in the last 168 hours. No results for input(s):  "LIPASE", "AMYLASE" in the last 168 hours. No results for input(s): "AMMONIA" in the last 168 hours. Coagulation Profile: No results for input(s): "INR", "PROTIME" in the last 168 hours. Cardiac Enzymes: No results for input(s): "CKTOTAL", "CKMB", "CKMBINDEX", "TROPONINI" in the last 168 hours. BNP (last 3 results) No results for input(s): "PROBNP" in the last 8760 hours. HbA1C: No results for input(s): "HGBA1C" in the last 72 hours.  CBG: Recent Labs  Lab 12/14/21 0422 12/14/21 0800 12/14/21 1154 12/14/21 1657 12/14/21 2042  GLUCAP 254* 261* 290* 283* 277*   Lipid Profile: No results for input(s): "CHOL", "HDL", "LDLCALC", "TRIG", "CHOLHDL", "LDLDIRECT" in the last 72 hours. Thyroid Function Tests: No results for input(s): "TSH", "T4TOTAL", "FREET4", "T3FREE", "THYROIDAB" in the last 72 hours. Anemia Panel: No results for input(s): "VITAMINB12", "FOLATE", "FERRITIN", "TIBC", "IRON", "RETICCTPCT" in the last 72 hours. Sepsis Labs: Recent Labs  Lab 12/12/21 0118  PROCALCITON 0.80    Recent Results (from the past 240 hour(s))  SARS Coronavirus 2 by RT PCR (hospital order, performed in [facility] hospital lab) *cepheid single result test* Anterior Nasal Swab     Status: Abnormal   Collection Time: 12/12/21  1:21 AM   Specimen: Anterior Nasal Swab  Result Value Ref Range Status   SARS Coronavirus 2 by RT PCR POSITIVE (A) NEGATIVE Final    Comment: (NOTE) SARS-CoV-2 target nucleic acids are DETECTED  SARS-CoV-2 RNA is generally detectable in upper respiratory specimens  during the acute phase of infection.  Positive results are indicative  of the presence of the identified virus, but do not rule out bacterial infection or co-infection with other pathogens not detected by the test.  Clinical correlation with patient history and  other diagnostic information is necessary to determine patient infection status.  The expected result is negative.  Fact Sheet for Patients:    https://www.patel.info/   Fact Sheet for Healthcare Providers:   https://hall.com/    This test is not yet approved or cleared by the [place] FDA and  has been authorized for detection and/or diagnosis of SARS-CoV-2 by FDA under an Emergency Use Authorization (EUA).  This EUA will remain in effect (meaning this test can be used) for the duration of  the COVID-19 declaration under Section 564(b)(1)  of the Act, 21 U.S.C. section 360-bbb-3(b)(1), unless the authorization is terminated or revoked sooner.   Performed at [facility], [address] [place], [place] [address]   Resp Panel by RT-PCR (Flu A&B, Covid) Anterior Nasal Swab     Status: Abnormal   Collection Time: 12/12/21  1:21 AM   Specimen: Anterior Nasal Swab  Result Value Ref Range Status   SARS Coronavirus 2 by RT PCR POSITIVE (A) NEGATIVE Final    Comment: (NOTE) SARS-CoV-2 target nucleic acids are DETECTED.  The  SARS-CoV-2 RNA is generally detectable in upper respiratory specimens during the acute phase of infection. Positive results are indicative of the presence of the identified virus, but do not rule out bacterial infection or co-infection with other pathogens not detected by the test. Clinical correlation with patient history and other diagnostic information is necessary to determine patient infection status. The expected result is Negative.  Fact Sheet for Patients: EntrepreneurPulse.com.au  Fact Sheet for Healthcare Providers: IncredibleEmployment.be  This test is not yet approved or cleared by the [place] FDA and  has been authorized for detection and/or diagnosis of SARS-CoV-2 by FDA under an Emergency Use Authorization (EUA).  This EUA will remain in effect (meaning this test can be used) for the duration of  the COVID-19 declaration under Section 564(b)(1) of the A ct, 21 U.S.C. section  360bbb-3(b)(1), unless the authorization is terminated or revoked sooner.     Influenza A by PCR NEGATIVE NEGATIVE Final   Influenza B by PCR NEGATIVE NEGATIVE Final    Comment: (NOTE) The Xpert Xpress SARS-CoV-2/FLU/RSV plus assay is intended as an aid in the diagnosis of influenza from Nasopharyngeal swab specimens and should not be used as a sole basis for treatment. Nasal washings and aspirates are unacceptable for Xpert Xpress SARS-CoV-2/FLU/RSV testing.  Fact Sheet for Patients: EntrepreneurPulse.com.au  Fact Sheet for Healthcare Providers: IncredibleEmployment.be  This test is not yet approved or cleared by the [place] FDA and has been authorized for detection and/or diagnosis of SARS-CoV-2 by FDA under an Emergency Use Authorization (EUA). This EUA will remain in effect (meaning this test can be used) for the duration of the COVID-19 declaration under Section 564(b)(1) of the Act, 21 U.S.C. section 360bbb-3(b)(1), unless the authorization is terminated or revoked.  Performed at [facility], [address] [place], [place] [address]   Blood culture (routine x 2)     Status: None (Preliminary result)   Collection Time: 12/12/21  4:50 AM   Specimen: BLOOD RIGHT ARM  Result Value Ref Range Status   Specimen Description BLOOD RIGHT ARM  Final   Special Requests   Final    BOTTLES DRAWN AEROBIC AND ANAEROBIC Blood Culture results may not be optimal due to an inadequate volume of blood received in culture bottles   Culture   Final    NO GROWTH 3 DAYS Performed at [facility], [address] [place], [place] [address]    Report Status PENDING  Incomplete  Blood culture (routine x 2)     Status: None (Preliminary result)   Collection Time: 12/12/21  4:50 AM   Specimen: BLOOD LEFT ARM  Result Value Ref Range Status   Specimen Description BLOOD LEFT ARM  Final   Special Requests   Final    BOTTLES DRAWN  AEROBIC AND ANAEROBIC Blood Culture results may not be optimal due to an inadequate volume of blood received in culture bottles   Culture   Final    NO GROWTH 3 DAYS Performed at [facility], [address], [place], [place] [address]    Report Status PENDING  Incomplete         Radiology Studies: No results found.      Scheduled Meds:  albuterol  2 puff Inhalation Q6H   amLODipine  2.5 mg Oral Daily   amoxicillin-clavulanate  1 tablet Oral TID   vitamin C  500 mg Oral Daily   heparin  5,000 Units Subcutaneous Q8H   insulin aspart  0-9 Units Subcutaneous  TID WC   insulin detemir  4 Units Subcutaneous QHS   pantoprazole  40 mg Oral Daily   [START ON 12/17/2021] pravastatin  10 mg Oral QHS   predniSONE  30 mg Oral Q breakfast   tamsulosin  0.4 mg Oral QPM   tiotropium  18 mcg Inhalation Daily   zinc sulfate  220 mg Oral Daily   Continuous Infusions:   LOS: 3 days    Time spent: 35 mins     Wyvonnia Dusky, MD [facility] Pager [phone]  If 7PM-7AM, please contact night-coverage [email or web address] 12/15/2021, 7:51 AM

## 2021-12-15 NOTE — Plan of Care (Signed)
  Problem: Activity: Goal: Ability to tolerate increased activity will improve Outcome: Progressing   Problem: Clinical Measurements: Goal: Ability to maintain a body temperature in the normal range will improve Outcome: Progressing   Problem: Respiratory: Goal: Ability to maintain adequate ventilation will improve Outcome: Progressing Goal: Ability to maintain a clear airway will improve Outcome: Progressing   Problem: Education: Goal: Knowledge of risk factors and measures for prevention of condition will improve Outcome: Progressing   Problem: Coping: Goal: Psychosocial and spiritual needs will be supported Outcome: Progressing   Problem: Respiratory: Goal: Will maintain a patent airway Outcome: Progressing Goal: Complications related to the disease process, condition or treatment will be avoided or minimized Outcome: Progressing   Problem: Education: Goal: Ability to describe self-care measures that may prevent or decrease complications (Diabetes Survival Skills Education) will improve Outcome: Progressing Goal: Individualized Educational Video(s) Outcome: Progressing   Problem: Coping: Goal: Ability to adjust to condition or change in health will improve Outcome: Progressing   Problem: Fluid Volume: Goal: Ability to maintain a balanced intake and output will improve Outcome: Progressing   Problem: Health Behavior/Discharge Planning: Goal: Ability to identify and utilize available resources and services will improve Outcome: Progressing Goal: Ability to manage health-related needs will improve Outcome: Progressing   Problem: Metabolic: Goal: Ability to maintain appropriate glucose levels will improve Outcome: Progressing   Problem: Nutritional: Goal: Maintenance of adequate nutrition will improve Outcome: Progressing Goal: Progress toward achieving an optimal weight will improve Outcome: Progressing   Problem: Skin Integrity: Goal: Risk for impaired skin  integrity will decrease Outcome: Progressing   Problem: Tissue Perfusion: Goal: Adequacy of tissue perfusion will improve Outcome: Progressing   Problem: Education: Goal: Knowledge of General Education information will improve Description: Including pain rating scale, medication(s)/side effects and non-pharmacologic comfort measures Outcome: Progressing   Problem: Health Behavior/Discharge Planning: Goal: Ability to manage health-related needs will improve Outcome: Progressing   Problem: Clinical Measurements: Goal: Ability to maintain clinical measurements within normal limits will improve Outcome: Progressing Goal: Will remain free from infection Outcome: Progressing Goal: Diagnostic test results will improve Outcome: Progressing Goal: Respiratory complications will improve Outcome: Progressing Goal: Cardiovascular complication will be avoided Outcome: Progressing   Problem: Activity: Goal: Risk for activity intolerance will decrease Outcome: Progressing   Problem: Nutrition: Goal: Adequate nutrition will be maintained Outcome: Progressing   Problem: Coping: Goal: Level of anxiety will decrease Outcome: Progressing   Problem: Elimination: Goal: Will not experience complications related to bowel motility Outcome: Progressing Goal: Will not experience complications related to urinary retention Outcome: Progressing   Problem: Pain Managment: Goal: General experience of comfort will improve Outcome: Progressing   Problem: Safety: Goal: Ability to remain free from injury will improve Outcome: Progressing   Problem: Skin Integrity: Goal: Risk for impaired skin integrity will decrease Outcome: Progressing

## 2021-12-15 NOTE — Progress Notes (Signed)
Physical Therapy Treatment Patient Details Name: Arthur [name] MRN: [id number] DOB: [birth year] Today's Date: 12/15/2021   History of Present Illness 88 y.o. male presenting to hospital 12/10 with c/o weakness and SOB; unable to ambulate.  Pt admitted with COVID infection with secondary bacterial infection, aspiration PNA, recurrent R pleural effusion, and frailty.  PMH includes htn, DM, HLD, COPD, chronic respiratory failure on 2 L home O2, OSA on CPAP, h/o multiple kyphoplasties.    PT Comments    Pain continues to be a significant limiter during mobility/transitions but pt eager to work with PT and see what he could do.  He needed a lot of assist with supine<>sit but was able to stand multiple times and showed decent tolerance (once up) to weight shifts, side steps, marching in place and even a little walking away from EOB.  Pt c/o pain with LE exercises but with cuing and encouragement was able to do some exercises against resistance and with seemingly increasing tolerance with increased reps.   Pt remains very functionally limited, will require continued PT.  Recommendations for follow up therapy are one component of a multi-disciplinary discharge planning process, led by the attending physician.  Recommendations may be updated based on patient status, additional functional criteria and insurance authorization.  Follow Up Recommendations  Skilled nursing-short term rehab (<3 hours/day) Can patient physically be transported by private vehicle: No   Assistance Recommended at Discharge Frequent or constant Supervision/Assistance  Patient can return home with the following Two people to help with walking and/or transfers;Two people to help with bathing/dressing/bathroom;Assistance with cooking/housework;Assist for transportation;Help with stairs or ramp for entrance   Equipment Recommendations  Rolling walker (2 wheels);BSC/3in1;Wheelchair (measurements PT);Wheelchair cushion (measurements  PT);Hospital bed    Recommendations for Other Services       Precautions / Restrictions Precautions Precautions: Fall Restrictions Weight Bearing Restrictions: No     Mobility  Bed Mobility Overal bed mobility: Needs Assistance Bed Mobility: Supine to Sit, Sit to Supine     Supine to sit: Max assist Sit to supine: Max assist   General bed mobility comments: Similar to prior sessions LBP is a significant limiter with mobility.  Pt was able to assist with moving LEs toward EOB and to try log roll, but could not lift torso w/o severe pain.   Heavy assist with transitions so/from sitting/supine    Transfers Overall transfer level: Needs assistance Equipment used: Rolling walker (2 wheels) Transfers: Sit to/from Stand Sit to Stand: Min assist, From elevated surface           General transfer comment: elevated bed ~3", direct assist to insure forward hip translation, but able to assist and showed good effort.  Expected c/o pain but did complete multiple sit to stand efforts.  Despite cuing pt does not control descent and needs direct assist to insure he doesn't "crash" down    Ambulation/Gait Ambulation/Gait assistance: Min assist Gait Distance (Feet): 4 Feet Assistive device: Rolling walker (2 wheels)         General Gait Details: Multiple bouts of standing, stepping, weight shifting.  He did reasonably well with side stepping each way along EOB.  We trialed a few steps forward and back, but pt fear (on not having anything behind him) and some unsteadiness with the walker and further ambulation away from EOB was deferred for continued side stepping activity.  after rest break did preform marching in place and despite some need for PT to steady he did show ability to march  each knee to the cross bar support - heavy UE reliance (and forward lean) with all standing/ambulation/weight shifting acts   Chief Strategy Officer    Modified Rankin (Stroke  Patients Only)       Balance Overall balance assessment: Needs assistance Sitting-balance support: Bilateral upper extremity supported, Feet supported Sitting balance-Leahy Scale: Fair Sitting balance - Comments: Due to back pain pt tended to lean backward in sitting, assist to adjust hip position and he did manage to maintain balance relatively well with b/l UEs                                    Cognition Arousal/Alertness: Awake/alert Behavior During Therapy: WFL for tasks assessed/performed Overall Cognitive Status: Within Functional Limits for tasks assessed                                          Exercises General Exercises - Lower Extremity Ankle Circles/Pumps: AROM, 10 reps Quad Sets: Strengthening, 10 reps Heel Slides: AROM, 5 reps (with resitsed leg ext) Hip ABduction/ADduction: Strengthening, 10 reps    General Comments        Pertinent Vitals/Pain Pain Assessment Pain Assessment: 0-10 Pain Score: 8  Pain Location: LBP - pt reports no pain laying flat at rest but calling out in severe pain with transitional movement.    Home Living                          Prior Function            PT Goals (current goals can now be found in the care plan section) Progress towards PT goals: Progressing toward goals    Frequency    Min 2X/week      PT Plan Current plan remains appropriate    Co-evaluation              AM-PAC PT "6 Clicks" Mobility   Outcome Measure  Help needed turning from your back to your side while in a flat bed without using bedrails?: A Lot Help needed moving from lying on your back to sitting on the side of a flat bed without using bedrails?: A Lot Help needed moving to and from a bed to a chair (including a wheelchair)?: A Lot Help needed standing up from a chair using your arms (e.g., wheelchair or bedside chair)?: A Lot Help needed to walk in hospital room?: A Lot Help needed climbing  3-5 steps with a railing? : Total 6 Click Score: 11    End of Session Equipment Utilized During Treatment: Oxygen;Gait belt Activity Tolerance: Patient limited by pain Patient left: with chair alarm set;with call bell/phone within reach Nurse Communication: Mobility status PT Visit Diagnosis: Other abnormalities of gait and mobility (R26.89);Muscle weakness (generalized) (M62.81);Pain Pain - part of body:  (lumbago)     Time: 9702-6378 PT Time Calculation (min) (ACUTE ONLY): 27 min  Charges:  $Therapeutic Exercise: 8-22 mins $Therapeutic Activity: 8-22 mins                     Kreg Shropshire, DPT 12/15/2021, 4:40 PM

## 2021-12-15 NOTE — TOC Progression Note (Addendum)
Transition of Care [facility]) - Progression Note    Patient Details  Name: Arthur [name] MRN: [id number] Date of Birth: 1933-10-14  Transition of Care [facility]) CM/SW [place], [place] Phone Number: 12/15/2021, 9:24 AM  Clinical Narrative:     Patient was accepted to [facility] and will need a [facility] authorization. TOC to inform patient of the acceptance and starting auth on [facility].  TOC spoke to the patient and he is agreeable to [facility]. TOC is starting insurance authorization.      Expected Discharge Plan and Services         Living arrangements for the past 2 months: Single Family Home                                       Social Determinants of Health (SDOH) Interventions    Readmission Risk Interventions     No data to display

## 2021-12-16 DIAGNOSIS — J690 Pneumonitis due to inhalation of food and vomit: Secondary | ICD-10-CM | POA: Diagnosis not present

## 2021-12-16 DIAGNOSIS — U071 COVID-19: Secondary | ICD-10-CM | POA: Diagnosis not present

## 2021-12-16 DIAGNOSIS — J449 Chronic obstructive pulmonary disease, unspecified: Secondary | ICD-10-CM

## 2021-12-16 DIAGNOSIS — J90 Pleural effusion, not elsewhere classified: Secondary | ICD-10-CM | POA: Diagnosis not present

## 2021-12-16 LAB — BASIC METABOLIC PANEL
Anion gap: 4 — ABNORMAL LOW (ref 5–15)
BUN: 100 mg/dL — ABNORMAL HIGH (ref 8–23)
CO2: 22 mmol/L (ref 22–32)
Calcium: 7.1 mg/dL — ABNORMAL LOW (ref 8.9–10.3)
Chloride: 110 mmol/L (ref 98–111)
Creatinine, Ser: 2.77 mg/dL — ABNORMAL HIGH (ref 0.61–1.24)
GFR, Estimated: 22 mL/min — ABNORMAL LOW (ref >60)
Glucose, Bld: 242 mg/dL — ABNORMAL HIGH (ref 70–99)
Potassium: 5.2 mmol/L — ABNORMAL HIGH (ref 3.5–5.1)
Sodium: 136 mmol/L (ref 135–145)

## 2021-12-16 LAB — CBC
HCT: 25.8 % — ABNORMAL LOW (ref 39.0–52.0)
Hemoglobin: 7.5 g/dL — ABNORMAL LOW (ref 13.0–17.0)
MCH: 27.4 pg (ref 26.0–34.0)
MCHC: 29.1 g/dL — ABNORMAL LOW (ref 30.0–36.0)
MCV: 94.2 fL (ref 80.0–100.0)
Platelets: 156 10*3/uL (ref 150–400)
RBC: 2.74 MIL/uL — ABNORMAL LOW (ref 4.22–5.81)
RDW: 19.2 % — ABNORMAL HIGH (ref 11.5–15.5)
WBC: 5.1 10*3/uL (ref 4.0–10.5)
nRBC: 0 % (ref 0.0–0.2)

## 2021-12-16 LAB — GLUCOSE, CAPILLARY
Glucose-Capillary: 188 mg/dL — ABNORMAL HIGH (ref 70–99)
Glucose-Capillary: 239 mg/dL — ABNORMAL HIGH (ref 70–99)
Glucose-Capillary: 283 mg/dL — ABNORMAL HIGH (ref 70–99)
Glucose-Capillary: 293 mg/dL — ABNORMAL HIGH (ref 70–99)
Glucose-Capillary: 298 mg/dL — ABNORMAL HIGH (ref 70–99)
Glucose-Capillary: 313 mg/dL — ABNORMAL HIGH (ref 70–99)

## 2021-12-16 NOTE — Plan of Care (Incomplete)
  Problem: Activity: Goal: Ability to tolerate increased activity will improve Outcome: Progressing   Problem: Clinical Measurements: Goal: Ability to maintain a body temperature in the normal range will improve Outcome: Progressing   Problem: Respiratory: Goal: Ability to maintain adequate ventilation will improve Outcome: Progressing Goal: Ability to maintain a clear airway will improve Outcome: Progressing   Problem: Education: Goal: Knowledge of risk factors and measures for prevention of condition will improve Outcome: Progressing   Problem: Coping: Goal: Psychosocial and spiritual needs will be supported Outcome: Progressing   Problem: Respiratory: Goal: Will maintain a patent airway Outcome: Progressing Goal: Complications related to the disease process, condition or treatment will be avoided or minimized Outcome: Progressing   Problem: Education: Goal: Ability to describe self-care measures that may prevent or decrease complications (Diabetes Survival Skills Education) will improve Outcome: Progressing Goal: Individualized Educational Video(s) Outcome: Progressing   Problem: Coping: Goal: Ability to adjust to condition or change in health will improve Outcome: Progressing   Problem: Fluid Volume: Goal: Ability to maintain a balanced intake and output will improve Outcome: Progressing   Problem: Health Behavior/Discharge Planning: Goal: Ability to identify and utilize available resources and services will improve Outcome: Progressing Goal: Ability to manage health-related needs will improve Outcome: Progressing   Problem: Metabolic: Goal: Ability to maintain appropriate glucose levels will improve Outcome: Progressing   Problem: Nutritional: Goal: Maintenance of adequate nutrition will improve Outcome: Progressing Goal: Progress toward achieving an optimal weight will improve Outcome: Progressing   Problem: Skin Integrity: Goal: Risk for impaired skin  integrity will decrease Outcome: Progressing   Problem: Tissue Perfusion: Goal: Adequacy of tissue perfusion will improve Outcome: Progressing   Problem: Education: Goal: Knowledge of General Education information will improve Description: Including pain rating scale, medication(s)/side effects and non-pharmacologic comfort measures Outcome: Progressing   Problem: Health Behavior/Discharge Planning: Goal: Ability to manage health-related needs will improve Outcome: Progressing   Problem: Clinical Measurements: Goal: Ability to maintain clinical measurements within normal limits will improve Outcome: Progressing Goal: Will remain free from infection Outcome: Progressing Goal: Diagnostic test results will improve Outcome: Progressing Goal: Respiratory complications will improve Outcome: Progressing Goal: Cardiovascular complication will be avoided Outcome: Progressing   Problem: Activity: Goal: Risk for activity intolerance will decrease Outcome: Progressing   Problem: Nutrition: Goal: Adequate nutrition will be maintained Outcome: Progressing   Problem: Coping: Goal: Level of anxiety will decrease Outcome: Progressing   Problem: Elimination: Goal: Will not experience complications related to bowel motility Outcome: Progressing Goal: Will not experience complications related to urinary retention Outcome: Progressing   Problem: Pain Managment: Goal: General experience of comfort will improve Outcome: Progressing   Problem: Safety: Goal: Ability to remain free from injury will improve Outcome: Progressing   Problem: Skin Integrity: Goal: Risk for impaired skin integrity will decrease Outcome: Progressing

## 2021-12-16 NOTE — Inpatient Diabetes Management (Signed)
Inpatient Diabetes Program Recommendations  AACE/ADA: New Consensus Statement on Inpatient Glycemic Control (2015)  Target Ranges:  Prepandial:   less than 140 mg/dL      Peak postprandial:   less than 180 mg/dL (1-2 hours)      Critically ill patients:  140 - 180 mg/dL   Lab Results  Component Value Date   GLUCAP 188 (H) 12/16/2021   HGBA1C 6.2 (H) 12/12/2021    Review of Glycemic Control  Diabetes history: DM Outpatient Diabetes medications:  Levemir 4 units QD Novolog 4 units TID id >200 mg/dL Current orders for Inpatient glycemic control:  Levemir 4 units QD Novolog 0-9 units TID Prednisone 30 mg QAM   Inpatient Diabetes Program Recommendations:     Might consider:   Levemir 6 units QD while on steroids Novolog 2 units TID with meals if consumes at least 50%   Will continue to follow while inpatient.   Thank you, Reche Dixon, MSN, [place] Diabetes Coordinator Inpatient Diabetes Program [phone] (team pager from 8a-5p)

## 2021-12-16 NOTE — Progress Notes (Addendum)
Progress Note   Patient: Arthur [name] [id number] DOB: January 31, 1933 DOA: 12/12/2021     4 DOS: the patient was seen and examined on 12/16/2021   Principal Problem:   Aspiration pneumonia ([place]) Active Problems:   COVID-19 virus infection   Recurrent right pleural effusion   COPD (chronic obstructive pulmonary disease) (HCC)   Frailty   Chronic respiratory failure with hypoxia (HCC)   CKD (chronic kidney disease) stage 4, GFR 15-29 ml/min (HCC)   Anemia in chronic kidney disease (CKD)   Type II diabetes mellitus ([place])   Long term (current) use of insulin (HCC)   Essential hypertension   OSA on CPAP   Generalized weakness   Pleural effusion  Brief hospital course: 88 y.o. male with medical history significant for HTN, BPH CKD 4, anemia of chronic disease on EPO shots by oncology, insulin-dependent type 2 diabetes, diastolic CHF with history of pleural effusion with last thoracentesis 01/2020, followed, advanced COPD, chronic respiratory failure on home O2 at 2 L, and OSA on CPAP followed by pulmonology, and who at baseline is chronically dyspneic per review of pulmonology notes who presents to the ED with worsening shortness of breath and generalized weakness to where he is unable to ambulate. Found to have covid +  12/13/21-  less cough and dyspnea.  12/13-15- weaned off to room air. PT recommends SNF, per TOC, [facility] requiring 10 days quarantine. (10/21)   Assessment and Plan: * Aspiration pneumonia ([place]) COVID infection with secondary bacterial infection Patient presents with generalized weakness and shortness of breath COVID test positive - Continue Augmentin CT chest showing "extensive airway impaction involving the right middle and lower lobes with complete collapse the right lower lobe and dense consolidation and near complete collapse of the right middle lobe.Findings may relate to aspiration and/or acute lobar pneumonia" Improved wit steroids, bronchodilators &  encourage incentive spirometry. Airborne and contact precautions    Acute on chronic hypoxic respiratory failure: continue on supplemental oxygen and wean back to baseline as tolerated Due to right pleural effusion with rounded atelectasis and associated compressive atelectasis - s/p IR evaluation - not a candidate for aspiration of pleural space.  - could consider outpt thoracic surgery eval   Generalized weakness: PT/OT recs SNF. [facility] accepted pt but waiting to see if pt has to quarantine for 10 days prior to going to SNF (12/21)   Hyperkalemia: lokelma x1. Repeat potassium level 5.2, monitor    Complex Recurrent loculated pleural effusion: recurrent & chronic. Not a candidate for thoracentesis as per IR.  Continue on augmentin    COPD: continue on prednisone, bronchodilators    Chronic diastolic CHF: appears euvolemic. Holding torsemide.    HTN: continue on CCB   DM2: well controlled, HbA1c 6.2. Continue on levemir, SSI w/ accuchecks    BPH: continue on flomax    ACKD: stable.Transfuse if Hb < 7.0    CKD IV: Cr is labile. Avoid nephrotoxic meds    Thrombocytopenia: etiology unclear, labile.   OSA on CPAP CPAP nightly  GOC Palliative care seen, DNR now. Palliative care to follow at SNF      Subjective: No new issues, motivated to work with PT  Physical Exam: Vitals:   12/16/21 0946 12/16/21 1248 12/16/21 1736 12/16/21 1736  BP: (!) 133/47 (!) 144/46 (!) 143/52 (!) 143/52  Pulse: 61 68 70 70  Resp: '16 16 16 16  '$ Temp: 98.8 F (37.1 C)  98.6 F (37 C) 98.6 F (37 C)  TempSrc:  SpO2: 94% 94% (!) 82% (!) 83%  Weight:      Height:       General exam: Appears comfortable  Respiratory system: diminished breath sounds b/l  Cardiovascular system: S1/S2+. No rubs or clicks  Gastrointestinal system: abd is soft, NT, ND & hypoactive bowel sounds  Central nervous system: alert and awake. Moves all extremities  Psychiatry: Judgement and insight appears at  baseline. Appropriate mood and affect    Data Reviewed:  K 5.2, Hb 7.5  Family Communication: None  Disposition: Status is: Inpatient Remains inpatient appropriate because: covid isolation for 10 days for SNF placement  Planned Discharge Destination: Skilled nursing facility   DVT prophylaxis - Heparin SQ Time spent: 35 minutes  Author: Max Sane, MD 12/16/2021 8:13 PM  For on call review [email or web address].

## 2021-12-17 DIAGNOSIS — J690 Pneumonitis due to inhalation of food and vomit: Secondary | ICD-10-CM | POA: Diagnosis not present

## 2021-12-17 LAB — CULTURE, BLOOD (ROUTINE X 2)
Culture: NO GROWTH
Culture: NO GROWTH

## 2021-12-17 LAB — CBC
HCT: 25.3 % — ABNORMAL LOW (ref 39.0–52.0)
Hemoglobin: 7.6 g/dL — ABNORMAL LOW (ref 13.0–17.0)
MCH: 27.9 pg (ref 26.0–34.0)
MCHC: 30 g/dL (ref 30.0–36.0)
MCV: 93 fL (ref 80.0–100.0)
Platelets: 175 10*3/uL (ref 150–400)
RBC: 2.72 MIL/uL — ABNORMAL LOW (ref 4.22–5.81)
RDW: 19 % — ABNORMAL HIGH (ref 11.5–15.5)
WBC: 4.7 10*3/uL (ref 4.0–10.5)
nRBC: 0 % (ref 0.0–0.2)

## 2021-12-17 LAB — BASIC METABOLIC PANEL
Anion gap: 5 (ref 5–15)
BUN: 90 mg/dL — ABNORMAL HIGH (ref 8–23)
CO2: 22 mmol/L (ref 22–32)
Calcium: 7.1 mg/dL — ABNORMAL LOW (ref 8.9–10.3)
Chloride: 106 mmol/L (ref 98–111)
Creatinine, Ser: 2.53 mg/dL — ABNORMAL HIGH (ref 0.61–1.24)
GFR, Estimated: 24 mL/min — ABNORMAL LOW (ref >60)
Glucose, Bld: 299 mg/dL — ABNORMAL HIGH (ref 70–99)
Potassium: 5.6 mmol/L — ABNORMAL HIGH (ref 3.5–5.1)
Sodium: 133 mmol/L — ABNORMAL LOW (ref 135–145)

## 2021-12-17 LAB — GLUCOSE, CAPILLARY
Glucose-Capillary: 215 mg/dL — ABNORMAL HIGH (ref 70–99)
Glucose-Capillary: 207 mg/dL — ABNORMAL HIGH (ref 70–99)
Glucose-Capillary: 224 mg/dL — ABNORMAL HIGH (ref 70–99)
Glucose-Capillary: 255 mg/dL — ABNORMAL HIGH (ref 70–99)

## 2021-12-17 MED ORDER — AMOXICILLIN-POT CLAVULANATE 500-125 MG PO TABS
1.0000 | ORAL_TABLET | Freq: Two times a day (BID) | ORAL | Status: DC
  Administered 2021-12-17 – 2021-12-18 (×3): 1 via ORAL
  Filled 2021-12-17 (×3): qty 1

## 2021-12-17 MED ORDER — SODIUM ZIRCONIUM CYCLOSILICATE 10 G PO PACK
10.0000 g | PACK | Freq: Three times a day (TID) | ORAL | Status: AC
  Administered 2021-12-17 – 2021-12-18 (×3): 10 g via ORAL
  Filled 2021-12-17 (×3): qty 1

## 2021-12-17 MED ORDER — INSULIN ASPART 100 UNIT/ML IV SOLN
5.0000 [IU] | Freq: Once | INTRAVENOUS | Status: AC
  Administered 2021-12-17: 5 [IU] via INTRAVENOUS
  Filled 2021-12-17 (×2): qty 0.05

## 2021-12-17 MED ORDER — CALCIUM GLUCONATE-NACL 1-0.675 GM/50ML-% IV SOLN
1.0000 g | Freq: Once | INTRAVENOUS | Status: AC
  Administered 2021-12-17: 1000 mg via INTRAVENOUS
  Filled 2021-12-17: qty 50

## 2021-12-17 MED ORDER — INSULIN DETEMIR 100 UNIT/ML ~~LOC~~ SOLN
8.0000 [IU] | Freq: Every day | SUBCUTANEOUS | Status: DC
  Administered 2021-12-17: 8 [IU] via SUBCUTANEOUS
  Filled 2021-12-17: qty 0.08

## 2021-12-17 NOTE — Progress Notes (Signed)
Occupational Therapy Treatment Patient Details Name: Arthur [name] MRN: [id number] DOB: [birth year] Today's Date: 12/17/2021   History of present illness Pt. is a 88 y.o. male presenting to hospital 12/10 with c/o weakness and SOB; unable to ambulate.  Pt admitted with COVID infection with secondary bacterial infection, aspiration PNA, recurrent R pleural effusion, and frailty.  PMH includes htn, DM, HLD, COPD, chronic respiratory failure on 2 L home O2, OSA on CPAP, h/o multiple kyphoplasties.   OT comments  Upon arrival Pt.'s O2 Tube was out, and lying beside him on the bed. Pt. reports that he has been taking it off. Pt. Was advised and assisted with placing it on. SpO2 86%, and went up to 92% with the O2 , and pursed lip breathing. Nursing was notified. Pt. education was provided about the importance of keeping the O2 in place. 10/10 LBP is limiting movement, and function. Pt. education was provided A/E use for LE ADLs, energy conservation/work simplification, and pursed lip breathing techniques during ADLs/IADLs. Pt. Continues to benefit from OT services for ADL training, A/E training, and pt. Education about energy conservation/work simplification, home modification, and DME.    Recommendations for follow up therapy are one component of a multi-disciplinary discharge planning process, led by the attending physician.  Recommendations may be updated based on patient status, additional functional criteria and insurance authorization.    Follow Up Recommendations  Skilled nursing-short term rehab (<3 hours/day)     Assistance Recommended at Discharge    Patient can return home with the following  Assistance with cooking/housework;Assist for transportation;Help with stairs or ramp for entrance;Two people to help with bathing/dressing/bathroom;Two people to help with walking and/or transfers   Equipment Recommendations       Recommendations for Other Services      Precautions / Restrictions  Precautions Precautions: Fall Restrictions Weight Bearing Restrictions: No       Mobility Bed Mobility Overal bed mobility: Needs Assistance   Rolling: Max assist              Transfers      Deferred 2/2 10/10 LBP                    Balance                                           ADL either performed or assessed with clinical judgement   ADL Overall ADL's : Needs assistance/impaired Eating/Feeding: Set up   Grooming: Set up               Lower Body Dressing: Maximal assistance                      Extremity/Trunk Assessment Upper Extremity Assessment Upper Extremity Assessment: Generalized weakness            Vision Baseline Vision/History: 1 Wears glasses Patient Visual Report: No change from baseline     Perception     Praxis      Cognition Arousal/Alertness: Awake/alert Behavior During Therapy: WFL for tasks assessed/performed Overall Cognitive Status: Within Functional Limits for tasks assessed                                          Exercises  Shoulder Instructions       General Comments      Pertinent Vitals/ Pain       Pain Assessment Pain Assessment: 0-10 Pain Score: 10-Worst pain ever Pain Location: LBP Pain Descriptors / Indicators: Sore, Tender, Guarding, Constant Pain Intervention(s): Limited activity within patient's tolerance, Monitored during session  Home Living                                          Prior Functioning/Environment              Frequency  Min 2X/week        Progress Toward Goals  OT Goals(current goals can now be found in the care plan section)  Progress towards OT goals: Progressing toward goals  Acute Rehab OT Goals Patient Stated Goal: To reduce pain OT Goal Formulation: With patient Time For Goal Achievement: 12/26/21 Potential to Achieve Goals: [place] Discharge plan remains  appropriate;Frequency remains appropriate    Co-evaluation                 AM-PAC OT "6 Clicks" Daily Activity     Outcome Measure   Help from another person eating meals?: A Little Help from another person taking care of personal grooming?: A Little Help from another person toileting, which includes using toliet, bedpan, or urinal?: A Lot Help from another person bathing (including washing, rinsing, drying)?: A Lot Help from another person to put on and taking off regular upper body clothing?: A Little Help from another person to put on and taking off regular lower body clothing?: A Lot 6 Click Score: 15    End of Session    OT Visit Diagnosis: Other abnormalities of gait and mobility (R26.89);Muscle weakness (generalized) (M62.81);Pain   Activity Tolerance Patient limited by pain   Patient Left in bed;with call bell/phone within reach;with bed alarm set   Nurse Communication          Time: 8099-8338 OT Time Calculation (min): 19 min  Charges: OT General Charges $OT Visit: 1 Visit OT Treatments $Self Care/Home Management : 8-22 mins  Harrel Carina, MS, OTR/L   Harrel Carina 12/17/2021, 2:05 PM

## 2021-12-17 NOTE — Progress Notes (Addendum)
Progress Note   Patient: Arthur [name] [id number] DOB: Dec 21, 1933 DOA: 12/12/2021     5 DOS: the patient was seen and examined on 12/17/2021   Principal Problem:   Aspiration pneumonia ([place]) Active Problems:   COVID-19 virus infection   Recurrent right pleural effusion   COPD (chronic obstructive pulmonary disease) (HCC)   Frailty   Chronic respiratory failure with hypoxia (HCC)   CKD (chronic kidney disease) stage 4, GFR 15-29 ml/min (HCC)   Anemia in chronic kidney disease (CKD)   Type II diabetes mellitus ([place])   Long term (current) use of insulin (HCC)   Essential hypertension   OSA on CPAP   Generalized weakness   Pleural effusion  Brief hospital course: 88 y.o. male with a PMH of HTN, HL, DM2, Advanced COPD on 2L [place] at baseline, OSA on CPAP, Obesity (BMI 28 kg/m2), Chronic Diastolic Heart Failure, complex right pleural effusion (last thoracentesis on 01/2020), CKD4 with anemia on EPO injections who presented to the ED on 12/11 with dyspnea found to have enlarged complex right pleural effusion with associated atelectasis and found to be COVID positive.  CT Scan impression was: "1. Enlarging, complex loculated right pleural effusion demonstrating progressive heterogeneous attenuation which may relate to proteinaceous or hemorrhagic debris or progressive pleural soft tissue the setting of mesothelioma or metastatic adenocarcinoma. Repeat contrast enhanced imaging and/or diagnostic thoracentesis may be helpful for further evaluation. 2. Extensive airway impaction involving the right middle and lower lobes with complete collapse the right lower lobe and dense consolidation and near complete collapse of the right middle lobe. Findings may relate to aspiration and/or acute lobar pneumonia."  12/12: diuresed and oxygen weaned. 12/13: oxygen weaned to room air.    Assessment and Plan:  Acute on chronic hypoxic respiratory failure, resolved:  - Patient's oxygen status is  back to baseline.  Acute Lobar Pneumonia / COVID positive: - This is Day 6 out of 7 of antibiotics.  Acute COPD Exacerbation / Centrilobular Emphysema: - This is Day 6 out of 7 of steroids.  Continue taper. - Continue Spiriva and Nebs, incentive spirometry, and pulmonary toilet.  Complex Parapneumonic Pleural Effusion, acute on chronic, right side:  - Pulmonary/IR did not deem candidate for thoracentesis/chest tube due to associated compressive atelectasis. - Patient responded to diuresis and antibiotics - so we will hold off on thoracic referral for decortication, debridement, or intrapleural fibrinolytics. - If symptoms recur and fluid re-accumulates, he will need thoracic referral to consider decortication of the thickened visceral pleura by VATS to see if this will allow a greater degree of lung expansion on the right side.  OSA/Obesity: - CPAP nightly.  Hyperkalemia: - K is 5.6 mmol/L.  Give IV insulin and Calcium and Lokelma.  Reheck in am.  Diabetes Mellitus Type 2: Glucose is running high. - Increase Levemir from 4 to 8 units daily. - Continue SSI with POC glucose ACHS.  CKD4:  - Creatinine is at baseline.  12/14/21  12/15/21  12/16/21  12/17/21   Creatinine 3.64 (H) 3.44 (H) 2.77 (H) 2.53 (H)   Physical Deconditioning / Generalized Weakness: - PT/OT recommends SNF and patient was accepted at [facility] status and need for 10 day quarantine, discharge date is planned for 12/21.  Code Status: DNR/DNI.     Subjective: Respiratory status is stable.  Patient denies any fevers, chills, cough, chest pain, or shortness of breath.    Physical Exam: Vitals:   12/16/21 2153 12/17/21 0546 12/17/21 3220  12/17/21 0906  BP: (!) 164/52 (!) 160/53 (!) 162/60 (!) 162/60  Pulse: 65 63 62 60  Resp: '18 16 20 20  '$ Temp: 98.5 F (36.9 C) 98.1 F (36.7 C) 99.3 F (37.4 C) 99.3 F (37.4 C)  TempSrc:      SpO2: 94% 92% 92% 92%  Weight:      Height:        Examination: General exam: chronically ill appearing, no acute distress HEENT: NCAT, PERRL Respiratory system: CTAB no WRR Cardiovascular system: Did not appreciate a murmur, regular, No JVD. Gastrointestinal system: No flank pain, Abdomen soft, NT,ND, BS+. Nervous System: No focal deficits. Extremities: No edema, distal peripheral pulses palpable.  Skin: No rashes, No bruises, No icterus. MSK: Physical Deconditioning  Data Reviewed:  Last metabolic panel Lab Results  Component Value Date   GLUCOSE 299 (H) 12/17/2021   NA 133 (L) 12/17/2021   K 5.6 (H) 12/17/2021   CL 106 12/17/2021   CO2 22 12/17/2021   BUN 90 (H) 12/17/2021   CREATININE 2.53 (H) 12/17/2021   GFRNONAA 24 (L) 12/17/2021   CALCIUM 7.1 (L) 12/17/2021   PROT 7.0 09/21/2021   ALBUMIN 3.5 09/21/2021   LABGLOB 3.4 07/23/2019   AGRATIO 1.1 (L) 07/15/2018   BILITOT 0.4 09/21/2021   ALKPHOS 66 09/21/2021   AST 11 (L) 09/21/2021   ALT 8 09/21/2021   ANIONGAP 5 12/17/2021    Family Communication: None  Disposition: Status is: Inpatient Remains inpatient appropriate because: COVID isolation for 10 days for SNF placement,   Planned Discharge Destination: Discharge date 12/11 to [facility].   DVT prophylaxis - Heparin SQ Time spent: 35 minutes  Author: George Hugh, MD 12/17/2021 2:34 PM  For on call review [email or web address].

## 2021-12-17 NOTE — Progress Notes (Signed)
PHARMACY NOTE:  ANTIMICROBIAL RENAL DOSAGE ADJUSTMENT  Current antimicrobial regimen includes a mismatch between antimicrobial dosage and estimated renal function.  As per policy approved by the Pharmacy & Therapeutics and Medical Executive Committees, the antimicrobial dosage will be adjusted accordingly.  Current antimicrobial dosage:  amoxicillin-clavulanate (AUGMENTIN) 500-125 MG po TID  Indication: PNA  Renal Function:  Estimated Creatinine Clearance: 20.1 mL/min (A) (by C-G formula based on SCr of 2.53 mg/dL (H)).    Antimicrobial dosage has been changed to:  amoxicillin-clavulanate (AUGMENTIN) 500-125 MG po BID  Thank you for allowing pharmacy to be a part of this patient's care.  Dallie Piles, [facility] 12/17/2021 7:29 AM

## 2021-12-17 NOTE — Progress Notes (Signed)
PT Cancellation Note  Patient Details Name: Arthur [name] MRN: [id number] DOB: 09/29/1933   Cancelled Treatment:     PT attempt. Pt was asleep upon entry but easily awakes and is agreeable to session. Unaware lunchtray was delivered already. Agrees to OOB to recliner however pt has extremely loose/watery stool. RN staff to assist with hygiene care. Acute PT will return at more appropriate time.    Willette Pa 12/17/2021, 1:18 PM

## 2021-12-18 DIAGNOSIS — J690 Pneumonitis due to inhalation of food and vomit: Secondary | ICD-10-CM | POA: Diagnosis not present

## 2021-12-18 LAB — BASIC METABOLIC PANEL
Anion gap: 4 — ABNORMAL LOW (ref 5–15)
BUN: 78 mg/dL — ABNORMAL HIGH (ref 8–23)
CO2: 21 mmol/L — ABNORMAL LOW (ref 22–32)
Calcium: 7.7 mg/dL — ABNORMAL LOW (ref 8.9–10.3)
Chloride: 110 mmol/L (ref 98–111)
Creatinine, Ser: 2.13 mg/dL — ABNORMAL HIGH (ref 0.61–1.24)
GFR, Estimated: 30 mL/min — ABNORMAL LOW (ref >60)
Glucose, Bld: 244 mg/dL — ABNORMAL HIGH (ref 70–99)
Potassium: 5 mmol/L (ref 3.5–5.1)
Sodium: 135 mmol/L (ref 135–145)

## 2021-12-18 LAB — GLUCOSE, CAPILLARY
Glucose-Capillary: 270 mg/dL — ABNORMAL HIGH (ref 70–99)
Glucose-Capillary: 171 mg/dL — ABNORMAL HIGH (ref 70–99)
Glucose-Capillary: 143 mg/dL — ABNORMAL HIGH (ref 70–99)
Glucose-Capillary: 124 mg/dL — ABNORMAL HIGH (ref 70–99)

## 2021-12-18 MED ORDER — INSULIN ASPART 100 UNIT/ML IV SOLN
5.0000 [IU] | Freq: Once | INTRAVENOUS | Status: AC
  Administered 2021-12-18: 5 [IU] via INTRAVENOUS
  Filled 2021-12-18: qty 0.05

## 2021-12-18 MED ORDER — ONDANSETRON HCL 4 MG/2ML IJ SOLN
4.0000 mg | Freq: Four times a day (QID) | INTRAMUSCULAR | Status: AC
  Administered 2021-12-18 – 2021-12-19 (×2): 4 mg via INTRAVENOUS
  Filled 2021-12-18: qty 2

## 2021-12-18 MED ORDER — INSULIN DETEMIR 100 UNIT/ML ~~LOC~~ SOLN: 12.0000 [IU] | Freq: Every day | SUBCUTANEOUS | Status: DC

## 2021-12-18 MED ORDER — ONDANSETRON HCL 4 MG/2ML IJ SOLN: 4.0000 mg | Freq: Four times a day (QID) | INTRAMUSCULAR | Status: DC | PRN

## 2021-12-18 MED ORDER — INSULIN DETEMIR 100 UNIT/ML ~~LOC~~ SOLN
10.0000 [IU] | Freq: Two times a day (BID) | SUBCUTANEOUS | Status: DC
  Filled 2021-12-18: qty 0.1

## 2021-12-18 MED ORDER — PANTOPRAZOLE SODIUM 40 MG IV SOLR
40.0000 mg | Freq: Two times a day (BID) | INTRAVENOUS | Status: DC
  Administered 2021-12-18 – 2021-12-22 (×7): 40 mg via INTRAVENOUS
  Filled 2021-12-18 (×7): qty 10

## 2021-12-18 MED ORDER — PREDNISONE 20 MG PO TABS
20.0000 mg | ORAL_TABLET | Freq: Every day | ORAL | Status: DC
  Administered 2021-12-19 – 2021-12-20 (×2): 20 mg via ORAL
  Filled 2021-12-18 (×2): qty 1

## 2021-12-18 MED ORDER — SODIUM CHLORIDE 0.9 % IV SOLN
3.0000 g | Freq: Two times a day (BID) | INTRAVENOUS | Status: DC
  Administered 2021-12-18 – 2021-12-20 (×5): 3 g via INTRAVENOUS
  Filled 2021-12-18 (×2): qty 3
  Filled 2021-12-18 (×4): qty 8

## 2021-12-18 MED ORDER — INSULIN DETEMIR 100 UNIT/ML ~~LOC~~ SOLN
10.0000 [IU] | Freq: Two times a day (BID) | SUBCUTANEOUS | Status: DC
  Administered 2021-12-18 – 2021-12-20 (×6): 10 [IU] via SUBCUTANEOUS
  Filled 2021-12-18 (×7): qty 0.1

## 2021-12-18 NOTE — Consult Note (Signed)
Pharmacy Antibiotic Note  Arthur [name] is a 88 y.o. male admitted on 12/12/2021 with aspiration pneumonia.  Pharmacy has been consulted for Unasyn dosing.  Plan: Unasyn 3gm q 12 hrs  Height: '5\' 5"'$  (165.1 cm) Weight: 77.1 kg (170 lb) IBW/kg (Calculated) : 61.5  Temp (24hrs), Avg:98.2 F (36.8 C), Min:97.9 F (36.6 C), Max:98.7 F (37.1 C)  Recent Labs  Lab 12/13/21 0403 12/14/21 0453 12/15/21 0325 12/16/21 0610 12/17/21 0358 12/18/21 0532  WBC 7.9 6.9 6.6 5.1 4.7  --   CREATININE 3.22* 3.64* 3.44* 2.77* 2.53* 2.13*    Estimated Creatinine Clearance: 23.8 mL/min (A) (by C-G formula based on SCr of 2.13 mg/dL (H)).    Allergies  Allergen Reactions   Codeine Other (See Comments)    Constipation    Antimicrobials this admission: Ceftriaxone  12/11 x 1 dose Metronidazole 12/11 >> 12/12 Paxlovid 12/11 x 1  Zosyn 12/11 x 1 dose Augmentin 12/13>>12/17 Unasyn 12/17>>  Dose adjustments this admission: Unasyn  Microbiology results: 12/11 BCx: NG x 5days 12/11 COVID +  Thank you for allowing pharmacy to be a part of this patient's care.  Kordel Leavy Rodriguez-Guzman PharmD, BCPS 12/18/2021 4:56 PM

## 2021-12-18 NOTE — Progress Notes (Signed)
Physical Therapy Treatment Patient Details Name: Arthur [name] MRN: [id number] DOB: Nov 04, 1933 Today's Date: 12/18/2021   History of Present Illness Pt. is a 88 y.o. male presenting to hospital 12/10 with c/o weakness and SOB; unable to ambulate.  Pt admitted with COVID infection with secondary bacterial infection, aspiration PNA, recurrent R pleural effusion, and frailty.  PMH includes htn, DM, HLD, COPD, chronic respiratory failure on 2 L home O2, OSA on CPAP, h/o multiple kyphoplasties.    PT Comments    Pt in bed.  Awakens to voice.  Has lunch tray but has not eaten at 2:30 and stated he is not hungry.  Encouraged to try soup and applesauce but he declined.  BLE AAROM x 10 with minimal effort stating his back hurts with movement.  Pt is encouraged to try to sit before and after session but he refuses all attempts.  Educated on importance of mobility but he continued to decline despite multiple approaches.  Declined repositioning in bed and encouraged to roll right/left but declined.     Recommendations for follow up therapy are one component of a multi-disciplinary discharge planning process, led by the attending physician.  Recommendations may be updated based on patient status, additional functional criteria and insurance authorization.  Follow Up Recommendations  Skilled nursing-short term rehab (<3 hours/day)     Assistance Recommended at Discharge Frequent or constant Supervision/Assistance  Patient can return home with the following Two people to help with walking and/or transfers;Two people to help with bathing/dressing/bathroom;Assistance with cooking/housework;Assist for transportation;Help with stairs or ramp for entrance   Equipment Recommendations  Rolling walker (2 wheels);BSC/3in1;Wheelchair (measurements PT);Wheelchair cushion (measurements PT);Hospital bed    Recommendations for Other Services       Precautions / Restrictions Precautions Precautions:  Fall Restrictions Weight Bearing Restrictions: No     Mobility  Bed Mobility               General bed mobility comments: refuses despite encouragement before and after session    Transfers                   General transfer comment: refused to try    Ambulation/Gait                   Stairs             Wheelchair Mobility    Modified Rankin (Stroke Patients Only)       Balance                                            Cognition Arousal/Alertness: Awake/alert Behavior During Therapy: WFL for tasks assessed/performed Overall Cognitive Status: Within Functional Limits for tasks assessed                                          Exercises Other Exercises Other Exercises: BLE AAROM x 10    General Comments        Pertinent Vitals/Pain Pain Assessment Pain Assessment: Faces Faces Pain Scale: Hurts even more Pain Location: LBP - refusing mobility and grimmacing wiht BLE AAROM Pain Descriptors / Indicators: Sore, Tender, Guarding, Constant Pain Intervention(s): Limited activity within patient's tolerance, Monitored during session    Home Living  Prior Function            PT Goals (current goals can now be found in the care plan section) Progress towards PT goals: Not progressing toward goals - comment    Frequency    Min 2X/week      PT Plan Current plan remains appropriate    Co-evaluation              AM-PAC PT "6 Clicks" Mobility   Outcome Measure  Help needed turning from your back to your side while in a flat bed without using bedrails?: A Lot Help needed moving from lying on your back to sitting on the side of a flat bed without using bedrails?: A Lot Help needed moving to and from a bed to a chair (including a wheelchair)?: A Lot Help needed standing up from a chair using your arms (e.g., wheelchair or bedside chair)?: A Lot Help  needed to walk in hospital room?: A Lot Help needed climbing 3-5 steps with a railing? : Total 6 Click Score: 11    End of Session Equipment Utilized During Treatment: Oxygen Activity Tolerance: Patient limited by fatigue;Patient limited by pain Patient left: in bed;with call bell/phone within reach;with bed alarm set Nurse Communication: Mobility status PT Visit Diagnosis: Other abnormalities of gait and mobility (R26.89);Muscle weakness (generalized) (M62.81);Pain     Time: 1431-1443 PT Time Calculation (min) (ACUTE ONLY): 12 min  Charges:  $Therapeutic Exercise: 8-22 mins                   Chesley Noon, PTA 12/18/21, 2:50 PM

## 2021-12-18 NOTE — Progress Notes (Addendum)
Progress Note   Patient: Arthur [name] NOM:[id number] DOB: 01-28-1933 DOA: 12/12/2021     6 DOS: the patient was seen and examined on 12/18/2021   Principal Problem:   Aspiration pneumonia ([place]) Active Problems:   COVID-19 virus infection   Recurrent right pleural effusion   COPD (chronic obstructive pulmonary disease) (HCC)   Frailty   Chronic respiratory failure with hypoxia (HCC)   CKD (chronic kidney disease) stage 4, GFR 15-29 ml/min (HCC)   Anemia in chronic kidney disease (CKD)   Type II diabetes mellitus ([place])   Long term (current) use of insulin (HCC)   Essential hypertension   OSA on CPAP   Generalized weakness   Pleural effusion  Brief hospital course: 88 y.o. male with a PMH of HTN, HL, DM2, Advanced COPD and Chronic Hypoxia on 2L [place] at baseline, OSA on CPAP, Obesity (BMI 28 kg/m2), Chronic Diastolic Heart Failure, complex right pleural effusion (last thoracentesis on 01/2020), CKD4 with anemia on EPO injections who presented to the ED on 12/11 with acute dyspnea found to have an enlarged complex right pleural effusion with associated atelectasis. He was also found to be COVID positive.  CT Scan impression was: "1. Enlarging, complex loculated right pleural effusion demonstrating progressive heterogeneous attenuation which may relate to proteinaceous or hemorrhagic debris or progressive pleural soft tissue the setting of mesothelioma or metastatic adenocarcinoma. Repeat contrast enhanced imaging and/or diagnostic thoracentesis may be helpful for further evaluation. 2. Extensive airway impaction involving the right middle and lower lobes with complete collapse the right lower lobe and dense consolidation and near complete collapse of the right middle lobe. Findings may relate to aspiration and/or acute lobar pneumonia."  12/12: diuresed and clinically improved. 12/13: oxygen weaned to room air.    Assessment and Plan:  Nausea and Vomiting: - Schedule Zofran and  start IV Protonix 40 mg BID until symptoms improve.  Acute on chronic hypoxic respiratory failure, resolved:  - Patient's oxygen status is back to baseline.  COVID positive: Patient is more fatigued, malaise, and weak today. - I spoke with daughter over the phone.  I explained this may be a complication of his COVID infection given his age and multiple comorbidities.  I assured her we would do our best to manage this medically.  I will re-consult Pulmonary to get another opinion, appreciate assistance and recommendations.  Acute Lobar Pneumonia: Patient remains symptomatic.  We will treat for 10 days. - This is Day 7 out of 10 of antibiotics.  Change PO antibiotics to IV Unasyn until patient is less nauseous.  Acute COPD Exacerbation / Centrilobular Emphysema: Patient still has mild expiratory wheezing. - This is Day 7 out of 10 of steroids. - Continue Spiriva and Nebs, incentive spirometry, and pulmonary toilet.  Complex Parapneumonic Pleural Effusion, acute on chronic, right side:  - Pulmonary/IR did not deem candidate for thoracentesis/chest tube due to associated compressive atelectasis. - Patient responded to diuresis and antibiotics - so we will hold off on any operative intervention. - If symptoms recur and fluid re-accumulates, he will need thoracic referral to consider decortication of the thickened visceral pleura by VATS to see if this will allow a greater degree of lung expansion on the right side.  OSA/Obesity: - CPAP nightly.  Hyperkalemia: - K improved from 5.6 to 5 mmol/L.  Continue Lokelma for now.  Diabetes Mellitus Type 2: Glucose is running high while on steroids. - Increase Levemir from 8 units daily to 10 units BID. - Continue SSI with  POC glucose ACHS.  CKD4:  - Creatinine is at baseline.   12/15/21  12/16/21  12/17/21  12/18/21   Creatinine 3.44 (H) 2.77 (H) 2.53 (H) 2.13 (H)    Physical Deconditioning / Generalized Weakness: - PT/OT recommends SNF and  patient was accepted at [place] status and need for 10 day quarantine, discharge date is planned for 12/21 to [facility].  Code Status: DNR/DNI.     Subjective: Respiratory status is stable.  Patient continues to have cough and mild dyspnea.  Patient denies any fevers, chills, or chest pain.  Physical Exam: Vitals:   12/17/21 1654 12/17/21 2149 12/18/21 0605 12/18/21 0833  BP: (!) 153/55 (!) 151/52 (!) 170/57 (!) 169/52  Pulse: 65 (!) 59 69 89  Resp: '16 18 18 16  '$ Temp:  97.9 F (36.6 C) 98.2 F (36.8 C) 98.7 F (37.1 C)  TempSrc:      SpO2: 95% 98% 91% (!) 83%  Weight:      Height:       Examination: General exam: chronically ill appearing, no acute distress HEENT: NCAT, PERRL Respiratory system: CTAB no WRR Cardiovascular system: Did not appreciate a murmur, regular, No JVD. Gastrointestinal system: No flank pain, Abdomen soft, NT,ND, BS+. Nervous System: No focal deficits. Extremities: No edema, distal peripheral pulses palpable.  Skin: No rashes, No bruises, No icterus. MSK: Physical Deconditioning  Data Reviewed:  Last metabolic panel Lab Results  Component Value Date   GLUCOSE 244 (H) 12/18/2021   NA 135 12/18/2021   K 5.0 12/18/2021   CL 110 12/18/2021   CO2 21 (L) 12/18/2021   BUN 78 (H) 12/18/2021   CREATININE 2.13 (H) 12/18/2021   GFRNONAA 30 (L) 12/18/2021   CALCIUM 7.7 (L) 12/18/2021   PROT 7.0 09/21/2021   ALBUMIN 3.5 09/21/2021   LABGLOB 3.4 07/23/2019   AGRATIO 1.1 (L) 07/15/2018   BILITOT 0.4 09/21/2021   ALKPHOS 66 09/21/2021   AST 11 (L) 09/21/2021   ALT 8 09/21/2021   ANIONGAP 4 (L) 12/18/2021    Family Communication: None  Disposition: Status is: Inpatient Remains inpatient appropriate because: COVID isolation for 10 days for SNF placement,   Planned Discharge Destination: Discharge date 12/21 to [facility].   DVT prophylaxis - Heparin SQ Time spent: 35 minutes  Author: George Hugh, MD 12/18/2021 1:27 PM  For on call review [email or web address].

## 2021-12-19 ENCOUNTER — Inpatient Hospital Stay: Payer: Medicare HMO

## 2021-12-19 DIAGNOSIS — J9611 Chronic respiratory failure with hypoxia: Secondary | ICD-10-CM

## 2021-12-19 DIAGNOSIS — Z794 Long term (current) use of insulin: Secondary | ICD-10-CM

## 2021-12-19 DIAGNOSIS — R54 Age-related physical debility: Secondary | ICD-10-CM

## 2021-12-19 DIAGNOSIS — N184 Chronic kidney disease, stage 4 (severe): Secondary | ICD-10-CM | POA: Diagnosis not present

## 2021-12-19 DIAGNOSIS — I10 Essential (primary) hypertension: Secondary | ICD-10-CM

## 2021-12-19 DIAGNOSIS — E1165 Type 2 diabetes mellitus with hyperglycemia: Secondary | ICD-10-CM

## 2021-12-19 DIAGNOSIS — D631 Anemia in chronic kidney disease: Secondary | ICD-10-CM

## 2021-12-19 DIAGNOSIS — J690 Pneumonitis due to inhalation of food and vomit: Secondary | ICD-10-CM | POA: Diagnosis not present

## 2021-12-19 DIAGNOSIS — R531 Weakness: Secondary | ICD-10-CM | POA: Diagnosis not present

## 2021-12-19 LAB — CBC
HCT: 27.5 % — ABNORMAL LOW (ref 39.0–52.0)
Hemoglobin: 8.3 g/dL — ABNORMAL LOW (ref 13.0–17.0)
MCH: 27.9 pg (ref 26.0–34.0)
MCHC: 30.2 g/dL (ref 30.0–36.0)
MCV: 92.6 fL (ref 80.0–100.0)
Platelets: 256 10*3/uL (ref 150–400)
RBC: 2.97 MIL/uL — ABNORMAL LOW (ref 4.22–5.81)
RDW: 18.9 % — ABNORMAL HIGH (ref 11.5–15.5)
WBC: 24 10*3/uL — ABNORMAL HIGH (ref 4.0–10.5)
nRBC: 0.1 % (ref 0.0–0.2)

## 2021-12-19 LAB — GLUCOSE, CAPILLARY
Glucose-Capillary: 101 mg/dL — ABNORMAL HIGH (ref 70–99)
Glucose-Capillary: 157 mg/dL — ABNORMAL HIGH (ref 70–99)
Glucose-Capillary: 245 mg/dL — ABNORMAL HIGH (ref 70–99)
Glucose-Capillary: 168 mg/dL — ABNORMAL HIGH (ref 70–99)
Glucose-Capillary: 144 mg/dL — ABNORMAL HIGH (ref 70–99)

## 2021-12-19 LAB — BASIC METABOLIC PANEL
Anion gap: 4 — ABNORMAL LOW (ref 5–15)
BUN: 73 mg/dL — ABNORMAL HIGH (ref 8–23)
CO2: 26 mmol/L (ref 22–32)
Calcium: 7.9 mg/dL — ABNORMAL LOW (ref 8.9–10.3)
Chloride: 111 mmol/L (ref 98–111)
Creatinine, Ser: 2.24 mg/dL — ABNORMAL HIGH (ref 0.61–1.24)
GFR, Estimated: 28 mL/min — ABNORMAL LOW (ref >60)
Glucose, Bld: 114 mg/dL — ABNORMAL HIGH (ref 70–99)
Potassium: 4.9 mmol/L (ref 3.5–5.1)
Sodium: 141 mmol/L (ref 135–145)

## 2021-12-19 MED ORDER — POLYETHYLENE GLYCOL 3350 17 G PO PACK
17.0000 g | PACK | Freq: Two times a day (BID) | ORAL | Status: AC
  Administered 2021-12-19 (×2): 17 g via ORAL
  Filled 2021-12-19 (×2): qty 1

## 2021-12-19 MED ORDER — POLYETHYLENE GLYCOL 3350 17 G PO PACK: 17.0000 g | PACK | Freq: Two times a day (BID) | ORAL | Status: DC | PRN

## 2021-12-19 MED ORDER — SPIRONOLACTONE 25 MG PO TABS
25.0000 mg | ORAL_TABLET | Freq: Every day | ORAL | Status: DC
  Administered 2021-12-19 – 2021-12-21 (×3): 25 mg via ORAL
  Filled 2021-12-19 (×3): qty 1

## 2021-12-19 MED ORDER — SENNOSIDES-DOCUSATE SODIUM 8.6-50 MG PO TABS
1.0000 | ORAL_TABLET | Freq: Two times a day (BID) | ORAL | Status: AC
  Administered 2021-12-19 (×2): 1 via ORAL
  Filled 2021-12-19 (×2): qty 1

## 2021-12-19 MED ORDER — TORSEMIDE 20 MG PO TABS
20.0000 mg | ORAL_TABLET | Freq: Every day | ORAL | Status: DC
  Administered 2021-12-19 – 2021-12-23 (×5): 20 mg via ORAL
  Filled 2021-12-19 (×5): qty 1

## 2021-12-19 MED ORDER — PROCHLORPERAZINE EDISYLATE 10 MG/2ML IJ SOLN: 5.0000 mg | Freq: Four times a day (QID) | INTRAMUSCULAR | Status: DC | PRN

## 2021-12-19 MED ORDER — SENNOSIDES-DOCUSATE SODIUM 8.6-50 MG PO TABS: 1.0000 | ORAL_TABLET | Freq: Two times a day (BID) | ORAL | Status: DC | PRN

## 2021-12-19 MED ORDER — METOCLOPRAMIDE HCL 5 MG/ML IJ SOLN
5.0000 mg | Freq: Four times a day (QID) | INTRAMUSCULAR | Status: DC | PRN
  Administered 2021-12-19: 5 mg via INTRAVENOUS
  Filled 2021-12-19: qty 2

## 2021-12-19 NOTE — Progress Notes (Signed)
Physical Therapy Treatment Patient Details Name: Arthur [name] MRN: [id number] DOB: [birth year] Today's Date: 12/19/2021   History of Present Illness Pt. is a 88 y.o. male presenting to hospital 12/10 with c/o weakness and SOB; unable to ambulate.  Pt admitted with COVID infection with secondary bacterial infection, aspiration PNA, recurrent R pleural effusion, and frailty.  PMH includes htn, DM, HLD, COPD, chronic respiratory failure on 2 L home O2, OSA on CPAP, h/o multiple kyphoplasties.    PT Comments    Agrees to session.  Transitions to sitting EOB with HOB raised and mod a x 1.  Needs min assist for sitting balance x 5 minutes with L lean.  Sat EOB right and may be better next session with sitting L side of bed due to lean.  He has difficulty shifting weight back to right side to transition to supine and requires max/dependant to do so.  During movement he does have a large loose BM and rolls right/left with max a for rolling and care.  Remained on right side and positioned to comfort.  Refuses lunch tray despite encouragement.   Recommendations for follow up therapy are one component of a multi-disciplinary discharge planning process, led by the attending physician.  Recommendations may be updated based on patient status, additional functional criteria and insurance authorization.  Follow Up Recommendations  Skilled nursing-short term rehab (<3 hours/day)     Assistance Recommended at Discharge Frequent or constant Supervision/Assistance  Patient can return home with the following Two people to help with walking and/or transfers;Two people to help with bathing/dressing/bathroom;Assistance with cooking/housework;Assist for transportation;Help with stairs or ramp for entrance   Equipment Recommendations  Rolling walker (2 wheels);BSC/3in1;Wheelchair (measurements PT);Wheelchair cushion (measurements PT);Hospital bed    Recommendations for Other Services       Precautions /  Restrictions Precautions Precautions: Fall Restrictions Weight Bearing Restrictions: No     Mobility  Bed Mobility Overal bed mobility: Needs Assistance Bed Mobility: Rolling, Supine to Sit, Sit to Supine Rolling: Mod assist, Max assist   Supine to sit: Mod assist Sit to supine: Max assist, Total assist        Transfers                   General transfer comment: unsafe to attempt    Ambulation/Gait                   Stairs             Wheelchair Mobility    Modified Rankin (Stroke Patients Only)       Balance Overall balance assessment: Needs assistance Sitting-balance support: Bilateral upper extremity supported, Feet supported Sitting balance-Leahy Scale: Poor Sitting balance - Comments: min a  x 1 for general support Postural control: Left lateral lean                                  Cognition Arousal/Alertness: Awake/alert Behavior During Therapy: WFL for tasks assessed/performed Overall Cognitive Status: Within Functional Limits for tasks assessed                                          Exercises Other Exercises Other Exercises: sitting EOB x 5 mintues before fatigue    General Comments        Pertinent Vitals/Pain Pain Assessment  Pain Assessment: Faces Faces Pain Scale: Hurts whole lot Pain Location: back with transitions Pain Descriptors / Indicators: Sore, Tender, Guarding, Constant Pain Intervention(s): Limited activity within patient's tolerance, Monitored during session, Repositioned    Home Living                          Prior Function            PT Goals (current goals can now be found in the care plan section) Progress towards PT goals: Progressing toward goals    Frequency    Min 2X/week      PT Plan Current plan remains appropriate    Co-evaluation              AM-PAC PT "6 Clicks" Mobility   Outcome Measure  Help needed turning from your  back to your side while in a flat bed without using bedrails?: A Lot Help needed moving from lying on your back to sitting on the side of a flat bed without using bedrails?: A Lot Help needed moving to and from a bed to a chair (including a wheelchair)?: Total Help needed standing up from a chair using your arms (e.g., wheelchair or bedside chair)?: Total Help needed to walk in hospital room?: Total Help needed climbing 3-5 steps with a railing? : Total 6 Click Score: 8    End of Session Equipment Utilized During Treatment: Oxygen Activity Tolerance: Patient limited by fatigue;Patient limited by pain Patient left: in bed;with call bell/phone within reach;with bed alarm set Nurse Communication: Mobility status PT Visit Diagnosis: Other abnormalities of gait and mobility (R26.89);Muscle weakness (generalized) (M62.81);Pain     Time: 1249-1306 PT Time Calculation (min) (ACUTE ONLY): 17 min  Charges:  $Therapeutic Activity: 8-22 mins                   Chesley Noon, PTA 12/19/21, 1:26 PM

## 2021-12-19 NOTE — Progress Notes (Signed)
Progress Note   Patient: Arthur [name] IHK:[id number] DOB: July 05, 1933 DOA: 12/12/2021     7 DOS: the patient was seen and examined on 12/19/2021   Principal Problem:   Aspiration pneumonia ([place]) Active Problems:   COVID-19 virus infection   Recurrent right pleural effusion   COPD (chronic obstructive pulmonary disease) (HCC)   Frailty   Chronic respiratory failure with hypoxia (HCC)   CKD (chronic kidney disease) stage 4, GFR 15-29 ml/min (HCC)   Anemia in chronic kidney disease (CKD)   Type II diabetes mellitus ([place])   Long term (current) use of insulin (HCC)   Essential hypertension   OSA on CPAP   Generalized weakness   Pleural effusion  Brief hospital course: 88 y.o. male with a PMH of HTN, HL, DM2, Advanced COPD and Chronic Hypoxia on 2L [place] at baseline, OSA on CPAP, Obesity (BMI 28 kg/m2), Diastolic Heart Failure, complex right pleural effusion (last thoracentesis on 01/2020), CKD4 with anemia on EPO injections who presented to the ED on 12/11 with acute dyspnea and admitted for aspiration pneumonia with enlarging right complex pleural effusion, and COVID-19 infection.  CT chest with enlarging complex loculated right pleural effusion and extensive airway impaction involving the RML and bilateral lower lobes with complete collapse of RLL and dense consolidation and near complete collapse of RML concerning for aspiration pneumonia.  He was also found to be COVID positive.  Is being managed for aspiration pneumonia with acute on chronic parapneumonic effusion and COVID-19 infection.  Pulmonology following.  Assessment and Plan: Aspiration pneumonia: CT chest as above.  CXR this morning with RLL infiltrate/volume loss. -Aspiration precaution and SLP eval for dysphagia -He was on p.o. antibiotics and switched back to IV Unasyn due to nausea. -Encourage incentive spirometry, OOB/PT/OT  Enlarging complex parapneumonic pleural effusion: Acute on chronic.  CT chest as above. -Pulmonary/IR  did not deem candidate for thoracentesis/chest tube due to associated compressive atelectasis. -CXR this morning showed widespread pulmonary density suggesting fluid overload/pulmonary edema -Pulmonology increase diuretics and Aldactone  Nausea and Vomiting: Seems to have resolved. -Continue Zofran and Protonix  Acute on chronic hypoxic respiratory failure: Likely due to pneumonia and effusion.  Stable on home 2 L now. - Patient's oxygen status is back to baseline. -Management as above  COVID-19 infection: Not sure, this factors into his breathing issue.  Could definitely contribute to his fatigue.   Acute Lobar Pneumonia: Patient remains symptomatic.  We will treat for 10 days. - This is Day 7 out of 10 of antibiotics.  Change PO antibiotics to IV Unasyn until patient is less nauseous.  Acute COPD Exacerbation / Centrilobular Emphysema: Patient still has mild expiratory wheezing. -Continue weaning prednisone. -Continue breathing treatments    CKD-4: Renal function at baseline. Recent Labs    12/22/20 1410 09/21/21 1426 12/11/21 2015 12/13/21 0403 12/14/21 0453 12/15/21 0325 12/16/21 0610 12/17/21 0358 12/18/21 0532 12/19/21 0441  BUN 50* 69* 73* 83* 88* 91* 100* 90* 78* 73*  CREATININE 2.01* 2.75* 2.88* 3.22* 3.64* 3.44* 2.77* 2.53* 2.13* 2.24*  -Monitor  Controlled IDDM-2 with hyperglycemia and CKD-4: A1c 6.2%. Recent Labs  Lab 12/18/21 1201 12/18/21 1603 12/18/21 2134 12/19/21 0742 12/19/21 1137  GLUCAP 171* 143* 124* 101* 157*  -Continue current insulin regimen  Physical Deconditioning / Generalized Weakness: - PT/OT recommends SNF and patient was accepted at [place] status and need for 10 day quarantine, discharge date is planned for 12/21 to [facility].  OSA/Obesity: Does not  seem to be compliant.  Hyperkalemia: Resolved  Constipation -Bowel regimen.  Code Status: DNR/DNI.     Subjective:  Seen and examined  earlier this morning.  No major events overnight of this morning.  Reports dry cough.  Reports improvement in his breathing.  He complains of back pain from lying in bed.  He says last bowel movement as of 5 days ago.  He denies nausea or vomiting.  Physical Exam: Vitals:   12/18/21 1605 12/18/21 2133 12/19/21 0539 12/19/21 0745  BP: (!) 172/61 (!) 153/54 (!) 159/48 (!) 163/48  Pulse: 89 73 66 63  Resp: '16 18 18 20  '$ Temp: 98 F (36.7 C) 98.3 F (36.8 C) 98 F (36.7 C) 98.2 F (36.8 C)  TempSrc:   Oral   SpO2: 93% 94% 97% 98%  Weight:      Height:       Examination: GENERAL: No apparent distress.  Nontoxic. HEENT: MMM.  Vision and hearing grossly intact.  NECK: Supple.  No apparent JVD.  RESP:  No IWOB.  Diminished aeration in right lung.  Some rhonchi in the left. CVS:  RRR. Heart sounds normal.  ABD/GI/GU: BS+. Abd soft, NTND.  MSK/EXT:  Moves extremities but globally weak. SKIN: no apparent skin lesion or wound NEURO: Awake and alert. Oriented appropriately.  No apparent focal neuro deficit. PSYCH: Calm. Normal affect.   Data Reviewed:  Last metabolic panel Lab Results  Component Value Date   GLUCOSE 114 (H) 12/19/2021   NA 141 12/19/2021   K 4.9 12/19/2021   CL 111 12/19/2021   CO2 26 12/19/2021   BUN 73 (H) 12/19/2021   CREATININE 2.24 (H) 12/19/2021   GFRNONAA 28 (L) 12/19/2021   CALCIUM 7.9 (L) 12/19/2021   PROT 7.0 09/21/2021   ALBUMIN 3.5 09/21/2021   LABGLOB 3.4 07/23/2019   AGRATIO 1.1 (L) 07/15/2018   BILITOT 0.4 09/21/2021   ALKPHOS 66 09/21/2021   AST 11 (L) 09/21/2021   ALT 8 09/21/2021   ANIONGAP 4 (L) 12/19/2021    Family Communication: None  Disposition: Status is: Inpatient Remains inpatient appropriate because: COVID isolation for 10 days for SNF placement,   Planned Discharge Destination: Discharge date 12/21 to [facility].   DVT prophylaxis - Heparin SQ Time spent: 35 minutes  Author: Mercy Riding, MD 12/19/2021 1:15  PM  For on call review [email or web address].

## 2021-12-19 NOTE — Progress Notes (Signed)
PULMONOLOGY         Date: 12/19/2021,   MRN# [id number] Arthur [name] [birth year]     AdmissionWeight: 77.1 kg                 CurrentWeight: 77.1 kg  Referring provider: Dr Arthur [name]   CHIEF COMPLAINT:   Large right pleural effusion   HISTORY OF PRESENT ILLNESS    HPI: Arthur [name] is a 88 y.o. male with medical history significant for HTN, BPH CKD 4, anemia of chronic disease on EPO shots by oncology, insulin-dependent type 2 diabetes, diastolic CHF with history of pleural effusion with last thoracentesis 01/2020, advanced COPD, chronic respiratory failure on home O2 at 2 L, and OSA on CPAP followed by pulmonology, and who at baseline is chronically dyspneic per review of pulmonology notes who presents to the ED with worsening shortness of breath and generalized weakness to where he is unable to ambulate.  He denies fever or chills or chest pain.  Denies pain in the lower extremities and denies swelling beyond his baseline. ED course and data review: BP on arrival 126/47 with mild tachypnea of 22 and O2 sat in the high 90s on home flow rate of 2 L. Labs with normal WBC, hemoglobin at baseline at 8.  BMP with creatinine 2.88 which is his baseline, troponin 12 and BNP 54.  Urinalysis unremarkable.  COVID and flu pending. EKG, personally viewed and interpreted showing sinus at 88 with no acute ST-T wave changes.   CT chest without contrast shows a loculated right pleural effusion, and extensive airway impaction with complete collapse of the right lower lobe and near complete collapse of right middle lobe consistent with aspiration and/or acute lobar pneumonia as further outlined below: IMPRESSION: 1. Enlarging, complex loculated right pleural effusion demonstrating progressive heterogeneous attenuation which may relate to proteinaceous or hemorrhagic debris or progressive pleural soft tissue the setting of mesothelioma or metastatic adenocarcinoma. Repeat contrast enhanced imaging  and/or diagnostic thoracentesis may be helpful for further evaluation. 2. Extensive airway impaction involving the right middle and lower lobes with complete collapse the right lower lobe and dense consolidation and near complete collapse of the right middle lobe. Findings may relate to aspiration and/or acute lobar pneumonia. 3. Mild emphysema. 4. Moderate multi-vessel coronary artery calcification. 5. Cholelithiasis.  Pulmonology consulted for additional evaluation of acute on chronic hypoxemia.    12/13/21- patient has improved overnight, weaning on O2 support. He is making adequate urine and reports less cough and less dyspnea.   12/14/21- patient is now on room air.  He is speaking in full sentences. Currently saturating >96% without supplemental O2.  He is negative 1.6L.  He is stable for PT/OT and dc planning.  He is using inhalers as prescribed. He using IS and flutter.  We have weaned prednisone to '30mg'$  daily and reduced abs to augmentin 500 tid.   12/15/21- patient is doing well this am. He has plan for SNF placement.  He is now stable and PCCM will sign off and are available as needed.   12/19/21- Asked by daughter to come back and re-evaluate patient due to worsening respiratory status.  Had performed CXR today.  There is worsening infiltrates and effusion.  I have increased diuresis with torsemide and aldactone.   PAST MEDICAL HISTORY   Past Medical History:  Diagnosis Date   Anemia of chronic kidney failure 08/21/2018   Arthritis    lower back   CHF (congestive heart failure) (  [place])    CKD (chronic kidney disease), stage III (H[facility])    COPD (chronic obstructive pulmonary disease) ([place])    Diabetes mellitus without complication ([place])    type 2   Dyspnea    when active -wears oxygen 2L via [place]   History of blood transfusion    Hyperlipidemia    Hypertension    OSA on CPAP 12/12/2021   Requires continuous at home supplemental oxygen    2L via [place]   Squamous cell  carcinoma in situ 09/17/2012   Right upper nasal dorsum. SCCis arising in [place]   Squamous cell carcinoma of skin 02/03/2020   R lat cheek, [facility]     SURGICAL HISTORY   Past Surgical History:  Procedure Laterality Date   COLONOSCOPY WITH PROPOFOL N/A 06/11/2018   Procedure: COLONOSCOPY WITH PROPOFOL;  Surgeon: Jonathon Bellows, MD;  Location[facility] ENDOSCOPY;  Service: Gastroenterology;  Laterality: N/A;   COLONOSCOPY WITH PROPOFOL N/A 06/13/2018   Procedure: COLONOSCOPY WITH PROPOFOL;  Surgeon: Jonathon Bellows, MD;  Location[facility] ENDOSCOPY;  Service: Gastroenterology;  Laterality: N/A;   ESOPHAGOGASTRODUODENOSCOPY Left 06/10/2018   Procedure: ESOPHAGOGASTRODUODENOSCOPY (EGD);  Surgeon: Jonathon Bellows, MD;  Location: [facility] ENDOSCOPY;  Service: Gastroenterology;  Laterality: Left;   EYE SURGERY     removed cataracts   GIVENS CAPSULE STUDY N/A 06/28/2018   Procedure: GIVENS CAPSULE STUDY;  Surgeon: Lucilla Lame, MD;  Location: [facility] ENDOSCOPY;  Service: Endoscopy;  Laterality: N/A;   GIVENS CAPSULE STUDY N/A 06/30/2018   Procedure: GIVENS CAPSULE STUDY;  Surgeon: Jonathon Bellows, MD;  Location: [facility] ENDOSCOPY;  Service: Gastroenterology;  Laterality: N/A;   KYPHOPLASTY N/A 10/31/2018   Procedure: KYPHOPLASTY L2;  Surgeon: [name], MD;  Location: [facility] ORS;  Service: Orthopedics;  Laterality: N/A;   KYPHOPLASTY N/A 12/24/2018   Procedure: KYPHOPLASTY L3;  Surgeon: [name], MD;  Location: [facility] ORS;  Service: Orthopedics;  Laterality: N/A;   KYPHOPLASTY N/A 02/18/2019   Procedure: L2 KYPHOPLASTY;  Surgeon: [name], MD;  Location: [facility] ORS;  Service: Orthopedics;  Laterality: N/A;   RADIOLOGY WITH ANESTHESIA N/A 01/28/2019   Procedure: MRI LUMBER SPINE WITHOUT CONTRAST;  Surgeon: Radiologist, Medication, MD;  Location: [place];  Service: Radiology;  Laterality: N/A;     FAMILY HISTORY   Family History  Problem Relation Age of Onset   COPD Mother    Cancer Father      SOCIAL HISTORY   Social  History   Tobacco Use   Smoking status: Former    Packs/day: 2.00    Years: 35.00    Total pack years: 70.00    Types: Cigarettes    Quit date: 11/04/1982    Years since quitting: 39.1   Smokeless tobacco: Current    Types: Chew  Vaping Use   Vaping Use: Never used  Substance Use Topics   Alcohol use: Not Currently   Drug use: No     MEDICATIONS    Home Medication:     Current Medication:  Current Facility-Administered Medications:    acetaminophen (TYLENOL) tablet 650 mg, 650 mg, Oral, Q6H PRN **OR** acetaminophen (TYLENOL) suppository 650 mg, 650 mg, Rectal, Q6H PRN, Athena Masse, MD   albuterol (VENTOLIN HFA) 108 (90 Base) MCG/ACT inhaler 2 puff, 2 puff, Inhalation, Q6H, Judd Gaudier V, MD, 2 puff at 12/19/21 0140   Ampicillin-Sulbactam (UNASYN) 3 g in sodium chloride 0.9 % 100 mL IVPB, 3 g, Intravenous, Q12H, Masoud, Hannah, MD, Last Rate: 200 mL/hr at 12/19/21 0512, 3 g at  12/19/21 0512   ascorbic acid (VITAMIN C) tablet 500 mg, 500 mg, Oral, Daily, Judd Gaudier V, MD, 500 mg at 12/18/21 0920   guaiFENesin-dextromethorphan (ROBITUSSIN DM) 100-10 MG/5ML syrup 5 mL, 5 mL, Oral, Q4H PRN, Wyvonnia Dusky, MD, 5 mL at 12/16/21 2249   heparin injection 5,000 Units, 5,000 Units, Subcutaneous, Q8H, Wouk, Ailene Rud, MD, 5,000 Units at 12/19/21 0515   HYDROcodone-acetaminophen (NORCO/VICODIN) 5-325 MG per tablet 1-2 tablet, 1-2 tablet, Oral, Q4H PRN, Athena Masse, MD, 1 tablet at 12/15/21 1600   insulin aspart (novoLOG) injection 0-9 Units, 0-9 Units, Subcutaneous, TID WC, Wouk, Ailene Rud, MD, 2 Units at 12/19/21 1153   insulin detemir (LEVEMIR) injection 10 Units, 10 Units, Subcutaneous, BID, George Hugh, MD, 10 Units at 12/19/21 1014   ondansetron (ZOFRAN) tablet 4 mg, 4 mg, Oral, Q6H PRN **OR** ondansetron (ZOFRAN) injection 4 mg, 4 mg, Intravenous, Q6H PRN, Athena Masse, MD, 4 mg at 12/19/21 1151   ondansetron (ZOFRAN) injection 4 mg, 4 mg, Intravenous,  Q6H PRN, George Hugh, MD   pantoprazole (PROTONIX) injection 40 mg, 40 mg, Intravenous, Q12H, Masoud, Jarrett Soho, MD, 40 mg at 12/19/21 1007   polyethylene glycol (MIRALAX / GLYCOLAX) packet 17 g, 17 g, Oral, BID, 17 g at 12/19/21 1151 **FOLLOWED BY** [START ON 12/20/2021] polyethylene glycol (MIRALAX / GLYCOLAX) packet 17 g, 17 g, Oral, BID PRN, Cyndia Skeeters, Taye T, MD   pravastatin (PRAVACHOL) tablet 10 mg, 10 mg, Oral, QHS, Wouk, Ailene Rud, MD, 10 mg at 12/18/21 2143   predniSONE (DELTASONE) tablet 20 mg, 20 mg, Oral, Q breakfast, Ottie Glazier, MD, 20 mg at 12/19/21 1007   senna-docusate (Senokot-S) tablet 1 tablet, 1 tablet, Oral, BID, 1 tablet at 12/19/21 1151 **FOLLOWED BY** [START ON 12/20/2021] senna-docusate (Senokot-S) tablet 1 tablet, 1 tablet, Oral, BID PRN, Mercy Riding, MD   spironolactone (ALDACTONE) tablet 25 mg, 25 mg, Oral, Daily, Lanney Gins, Brelee Renk, MD   tamsulosin (FLOMAX) capsule 0.4 mg, 0.4 mg, Oral, QPM, Wouk, Ailene Rud, MD, 0.4 mg at 12/17/21 1707   tiotropium (SPIRIVA) inhalation capsule ([facility] use ONLY) 18 mcg, 18 mcg, Inhalation, Daily, Wouk, Ailene Rud, MD, 18 mcg at 12/17/21 1017   torsemide (DEMADEX) tablet 20 mg, 20 mg, Oral, Daily, Ottie Glazier, MD   zinc sulfate capsule 220 mg, 220 mg, Oral, Daily, Athena Masse, MD, 220 mg at 12/18/21 0086  Facility-Administered Medications Ordered in Other Encounters:    0.9 %  sodium chloride infusion, , Intravenous, Continuous, Earlie Server, MD, Last Rate: 10 mL/hr at 10/15/20 1452, New Bag at 10/15/20 1452    ALLERGIES   Codeine     REVIEW OF SYSTEMS    Review of Systems:  Gen:  Denies  fever, sweats, chills weigh loss  HEENT: Denies blurred vision, double vision, ear pain, eye pain, hearing loss, nose bleeds, sore throat Cardiac:  No dizziness, chest pain or heaviness, chest tightness,edema Resp:   reports dyspnea chronically now worsened Gi: Denies swallowing difficulty, stomach pain, nausea or vomiting,  diarrhea, constipation, bowel incontinence Gu:  Denies bladder incontinence, burning urine Ext:   Denies Joint pain, stiffness or swelling Skin: Denies  skin rash, easy bruising or bleeding or hives Endoc:  Denies polyuria, polydipsia , polyphagia or weight change Psych:   Denies depression, insomnia or hallucinations   Other:  All other systems negative   VS: BP (!) 163/48 (BP Location: Right Arm)   Pulse 63   Temp 98.2 F (36.8 C)   Resp 20  Ht '5\' 5"'$  (1.651 m)   Wt 77.1 kg   SpO2 98%   BMI 28.29 kg/m      PHYSICAL EXAM    GENERAL:NAD, no fevers, chills, no weakness no fatigue HEAD: Normocephalic, atraumatic.  EYES: Pupils equal, round, reactive to light. Extraocular muscles intact. No scleral icterus.  MOUTH: Moist mucosal membrane. Dentition intact. No abscess noted.  EAR, NOSE, THROAT: Clear without exudates. No external lesions.  NECK: Supple. No thyromegaly. No nodules. No JVD.  PULMONARY: decreased breath sounds on right with mild rhonchi worse at bases bilaterally.  CARDIOVASCULAR: S1 and S2. Regular rate and rhythm. No murmurs, rubs, or gallops. No edema. Pedal pulses 2+ bilaterally.  GASTROINTESTINAL: Soft, nontender, nondistended. No masses. Positive bowel sounds. No hepatosplenomegaly.  MUSCULOSKELETAL: No swelling, clubbing, or edema. Range of motion full in all extremities.  NEUROLOGIC: Cranial nerves II through XII are intact. No gross focal neurological deficits. Sensation intact. Reflexes intact.  SKIN: No ulceration, lesions, rashes, or cyanosis. Skin warm and dry. Turgor intact.  PSYCHIATRIC: Mood, affect within normal limits. The patient is awake, alert and oriented x 3. Insight, judgment intact.       IMAGING     Narrative & Impression  CLINICAL DATA:  Pneumonia, complication suspected, xray done   EXAM: CT CHEST WITHOUT CONTRAST   TECHNIQUE: Multidetector CT imaging of the chest was performed following the standard protocol without IV  contrast.   RADIATION DOSE REDUCTION: This exam was performed according to the departmental dose-optimization program which includes automated exposure control, adjustment of the mA and/or kV according to patient size and/or use of iterative reconstruction technique.   COMPARISON:  10/07/2018, CT abdomen pelvis 03/04/2019   FINDINGS: Cardiovascular: Moderate multi-vessel coronary artery calcification. Global cardiac size within normal limits. No pericardial effusion. Central pulmonary arteries are enlarged in keeping with changes of pulmonary arterial hypertension. Moderate atherosclerotic calcification within the thoracic aorta. No aortic aneurysm.   Mediastinum/Nodes: There is progressive mediastinal shift to the right related to right-sided volume loss. Visualized thyroid is unremarkable. No pathologic thoracic adenopathy. The esophagus is unremarkable. Small hiatal hernia.   Lungs/Pleura: Complex large, posterobasal loculated right pleural effusion is again identified demonstrating heterogeneous attenuation which may relate to proteinaceous or hemorrhagic debris or progressive pleural soft tissue the setting of mesothelioma or metastatic adenocarcinoma. Evaluation is slightly limited by noncontrast technique. The right lower lobe is completely collapsed with extensive airway impaction. There is extensive airway impaction involving the right middle lobar bronchi with dense consolidation of the right middle lobe and near complete collapse. Mild emphysema. No pleural effusion on the left. No pneumothorax.   Upper Abdomen: Cholelithiasis. Hyperdense gallbladder contents may relate to vicarious excretion of contrast or layering sludge. No superimposed pericholecystic inflammatory change. No acute abnormality.   Musculoskeletal: Degenerative changes are noted throughout the thoracolumbar spine with ankylosis of the vertebral bodies of T5-T12. L3 vertebroplasty has been performed.  No acute bone abnormality. No lytic or blastic bone lesion.   IMPRESSION: 1. Enlarging, complex loculated right pleural effusion demonstrating progressive heterogeneous attenuation which may relate to proteinaceous or hemorrhagic debris or progressive pleural soft tissue the setting of mesothelioma or metastatic adenocarcinoma. Repeat contrast enhanced imaging and/or diagnostic thoracentesis may be helpful for further evaluation. 2. Extensive airway impaction involving the right middle and lower lobes with complete collapse the right lower lobe and dense consolidation and near complete collapse of the right middle lobe. Findings may relate to aspiration and/or acute lobar pneumonia. 3. Mild emphysema. 4. Moderate multi-vessel  coronary artery calcification. 5. Cholelithiasis.   Aortic Atherosclerosis (ICD10-I70.0) and Emphysema (ICD10-J43.9).     Electronically Signed   By: Fidela Salisbury M.D.   On: 12/12/2021 03:22         ASSESSMENT/PLAN   Acute on chronic hypoxemic respiratory failure        Due to right pleural effusion with rounded atelectasis and associated compressive atelectasis      - s/p IR evaluation - not a candidate for aspiration of pleural space.       -continue with current antibiotics for empiric pneumonia treatment      -BNP and troponin are within reference range and its unlikely that additional cardiac optimization would help   - he may benefit from thoracic surgery evaluation due to possible fibrothorax with rounded atelectasis and ex-vacuo physiology -patient has improved overnight  Airway impaction of right middle lobe    Will start nebulizer with mucomyst 58m 20%   COPD with centrilobular emphysema  -continue generic COPD care path with  -Rocephin IV  - Prednisone '40mg'$  daily   - spiriva and albuterol with Duoneb    Thank you for allowing me to participate in the care of this patient.   Patient/Family are satisfied with care plan and all  questions have been answered.    Provider disclosure: Patient with at least one acute or chronic illness or injury that poses a threat to life or bodily function and is being managed actively during this encounter.  All of the below services have been performed independently by signing provider:  review of prior documentation from internal and or external health records.  Review of previous and current lab results.  Interview and comprehensive assessment during patient visit today. Review of current and previous chest radiographs/CT scans. Discussion of management and test interpretation with health care team and patient/family.   This document was prepared using Dragon voice recognition software and may include unintentional dictation errors.     FOttie Glazier M.D.  Division of Pulmonary & Critical Care Medicine

## 2021-12-19 NOTE — Progress Notes (Addendum)
SLP Cancellation Note  Patient Details Name: Arthur [name] MRN: [id number] DOB: 09-25-1933   Cancelled treatment:       SLP consult received and appreciated. Chart review completed. Given concern for silent aspiration, clinical swallowing evaluation deferred at this time. MBSS tentatively scheduled for next date to instrumentally evaluate pt's pharyngeal swallow function. RN and MD made aware of POC.  Cherrie Gauze, M.S., [facility] [phone] [place])  Quintella Baton 12/19/2021, 2:24 PM

## 2021-12-20 DIAGNOSIS — J9621 Acute and chronic respiratory failure with hypoxia: Secondary | ICD-10-CM | POA: Diagnosis not present

## 2021-12-20 LAB — GLUCOSE, CAPILLARY
Glucose-Capillary: 79 mg/dL (ref 70–99)
Glucose-Capillary: 83 mg/dL (ref 70–99)
Glucose-Capillary: 161 mg/dL — ABNORMAL HIGH (ref 70–99)
Glucose-Capillary: 158 mg/dL — ABNORMAL HIGH (ref 70–99)

## 2021-12-20 MED ORDER — PREDNISONE 10 MG PO TABS
10.0000 mg | ORAL_TABLET | Freq: Every day | ORAL | Status: DC
  Administered 2021-12-21 – 2021-12-23 (×3): 10 mg via ORAL
  Filled 2021-12-20 (×3): qty 1

## 2021-12-20 NOTE — Progress Notes (Signed)
PULMONOLOGY         Date: 12/20/2021,   MRN# [id number] Arthur [name] Jul 11, 1933     AdmissionWeight: 77.1 kg                 CurrentWeight: 77.1 kg  Referring provider: Dr Si Raider   CHIEF COMPLAINT:   Large right pleural effusion   HISTORY OF PRESENT ILLNESS    HPI: Arthur [name] is a 88 y.o. male with medical history significant for HTN, BPH CKD 4, anemia of chronic disease on EPO shots by oncology, insulin-dependent type 2 diabetes, diastolic CHF with history of pleural effusion with last thoracentesis 01/2020, advanced COPD, chronic respiratory failure on home O2 at 2 L, and OSA on CPAP followed by pulmonology, and who at baseline is chronically dyspneic per review of pulmonology notes who presents to the ED with worsening shortness of breath and generalized weakness to where he is unable to ambulate.  He denies fever or chills or chest pain.  Denies pain in the lower extremities and denies swelling beyond his baseline. ED course and data review: BP on arrival 126/47 with mild tachypnea of 22 and O2 sat in the high 90s on home flow rate of 2 L. Labs with normal WBC, hemoglobin at baseline at 8.  BMP with creatinine 2.88 which is his baseline, troponin 12 and BNP 54.  Urinalysis unremarkable.  COVID and flu pending. EKG, personally viewed and interpreted showing sinus at 88 with no acute ST-T wave changes.   CT chest without contrast shows a loculated right pleural effusion, and extensive airway impaction with complete collapse of the right lower lobe and near complete collapse of right middle lobe consistent with aspiration and/or acute lobar pneumonia as further outlined below: IMPRESSION: 1. Enlarging, complex loculated right pleural effusion demonstrating progressive heterogeneous attenuation which may relate to proteinaceous or hemorrhagic debris or progressive pleural soft tissue the setting of mesothelioma or metastatic adenocarcinoma. Repeat contrast enhanced imaging  and/or diagnostic thoracentesis may be helpful for further evaluation. 2. Extensive airway impaction involving the right middle and lower lobes with complete collapse the right lower lobe and dense consolidation and near complete collapse of the right middle lobe. Findings may relate to aspiration and/or acute lobar pneumonia. 3. Mild emphysema. 4. Moderate multi-vessel coronary artery calcification. 5. Cholelithiasis.  Pulmonology consulted for additional evaluation of acute on chronic hypoxemia.    12/13/21- patient has improved overnight, weaning on O2 support. He is making adequate urine and reports less cough and less dyspnea.   12/14/21- patient is now on room air.  He is speaking in full sentences. Currently saturating >96% without supplemental O2.  He is negative 1.6L.  He is stable for PT/OT and dc planning.  He is using inhalers as prescribed. He using IS and flutter.  We have weaned prednisone to '30mg'$  daily and reduced abs to augmentin 500 tid.   12/15/21- patient is doing well this am. He has plan for SNF placement.  He is now stable and PCCM will sign off and are available as needed.   12/19/21- Asked by daughter to come back and re-evaluate patient due to worsening respiratory status.  Had performed CXR today.  There is worsening infiltrates and effusion.  I have increased diuresis with torsemide and aldactone.   PAST MEDICAL HISTORY   Past Medical History:  Diagnosis Date   Anemia of chronic kidney failure 08/21/2018   Arthritis    lower back   CHF (congestive heart failure) (  [place])    CKD (chronic kidney disease), stage III (H[facility])    COPD (chronic obstructive pulmonary disease) ([place])    Diabetes mellitus without complication ([place])    type 2   Dyspnea    when active -wears oxygen 2L via [place]   History of blood transfusion    Hyperlipidemia    Hypertension    OSA on CPAP 12/12/2021   Requires continuous at home supplemental oxygen    2L via    Squamous cell  carcinoma in situ 09/17/2012   Right upper nasal dorsum. SCCis arising in [place]   Squamous cell carcinoma of skin 02/03/2020   R lat cheek, EDC     SURGICAL HISTORY   Past Surgical History:  Procedure Laterality Date   COLONOSCOPY WITH PROPOFOL N/A 06/11/2018   Procedure: COLONOSCOPY WITH PROPOFOL;  Surgeon: Jonathon Bellows, MD;  Location: [facility] ENDOSCOPY;  Service: Gastroenterology;  Laterality: N/A;   COLONOSCOPY WITH PROPOFOL N/A 06/13/2018   Procedure: COLONOSCOPY WITH PROPOFOL;  Surgeon: Jonathon Bellows, MD;  Location: [facility] ENDOSCOPY;  Service: Gastroenterology;  Laterality: N/A;   ESOPHAGOGASTRODUODENOSCOPY Left 06/10/2018   Procedure: ESOPHAGOGASTRODUODENOSCOPY (EGD);  Surgeon: Jonathon Bellows, MD;  Location: [facility] ENDOSCOPY;  Service: Gastroenterology;  Laterality: Left;   EYE SURGERY     removed cataracts   GIVENS CAPSULE STUDY N/A 06/28/2018   Procedure: GIVENS CAPSULE STUDY;  Surgeon: Lucilla Lame, MD;  Location: [facility] ENDOSCOPY;  Service: Endoscopy;  Laterality: N/A;   GIVENS CAPSULE STUDY N/A 06/30/2018   Procedure: GIVENS CAPSULE STUDY;  Surgeon: Jonathon Bellows, MD;  Location: [facility] ENDOSCOPY;  Service: Gastroenterology;  Laterality: N/A;   KYPHOPLASTY N/A 10/31/2018   Procedure: KYPHOPLASTY L2;  Surgeon: [name], MD;  Location: [facility] ORS;  Service: Orthopedics;  Laterality: N/A;   KYPHOPLASTY N/A 12/24/2018   Procedure: KYPHOPLASTY L3;  Surgeon: [name], MD;  Location: [facility] ORS;  Service: Orthopedics;  Laterality: N/A;   KYPHOPLASTY N/A 02/18/2019   Procedure: L2 KYPHOPLASTY;  Surgeon: [name], MD;  Location: [facility] ORS;  Service: Orthopedics;  Laterality: N/A;   RADIOLOGY WITH ANESTHESIA N/A 01/28/2019   Procedure: MRI LUMBER SPINE WITHOUT CONTRAST;  Surgeon: Radiologist, Medication, MD;  Location: [place];  Service: Radiology;  Laterality: N/A;     FAMILY HISTORY   Family History  Problem Relation Age of Onset   COPD Mother    Cancer Father      SOCIAL HISTORY   Social  History   Tobacco Use   Smoking status: Former    Packs/day: 2.00    Years: 35.00    Total pack years: 70.00    Types: Cigarettes    Quit date: 11/04/1982    Years since quitting: 39.1   Smokeless tobacco: Current    Types: Chew  Vaping Use   Vaping Use: Never used  Substance Use Topics   Alcohol use: Not Currently   Drug use: No     MEDICATIONS    Home Medication:     Current Medication:  Current Facility-Administered Medications:    acetaminophen (TYLENOL) tablet 650 mg, 650 mg, Oral, Q6H PRN **OR** acetaminophen (TYLENOL) suppository 650 mg, 650 mg, Rectal, Q6H PRN, Athena Masse, MD   albuterol (VENTOLIN HFA) 108 (90 Base) MCG/ACT inhaler 2 puff, 2 puff, Inhalation, Q6H, Judd Gaudier V, MD, 2 puff at 12/20/21 0930   Ampicillin-Sulbactam (UNASYN) 3 g in sodium chloride 0.9 % 100 mL IVPB, 3 g, Intravenous, Q12H, Masoud, Hannah, MD, Last Rate: 200 mL/hr at 12/20/21 0546, 3 g at  12/20/21 0546   ascorbic acid (VITAMIN C) tablet 500 mg, 500 mg, Oral, Daily, Judd Gaudier V, MD, 500 mg at 12/20/21 0929   guaiFENesin-dextromethorphan (ROBITUSSIN DM) 100-10 MG/5ML syrup 5 mL, 5 mL, Oral, Q4H PRN, Wyvonnia Dusky, MD, 5 mL at 12/16/21 2249   heparin injection 5,000 Units, 5,000 Units, Subcutaneous, Q8H, Wouk, Ailene Rud, MD, 5,000 Units at 12/20/21 0547   insulin aspart (novoLOG) injection 0-9 Units, 0-9 Units, Subcutaneous, TID WC, Wouk, Ailene Rud, MD, 3 Units at 12/19/21 1626   insulin detemir (LEVEMIR) injection 10 Units, 10 Units, Subcutaneous, BID, George Hugh, MD, 10 Units at 12/20/21 0930   ondansetron (ZOFRAN) tablet 4 mg, 4 mg, Oral, Q6H PRN **OR** ondansetron (ZOFRAN) injection 4 mg, 4 mg, Intravenous, Q6H PRN, Athena Masse, MD, 4 mg at 12/19/21 2052   ondansetron (ZOFRAN) injection 4 mg, 4 mg, Intravenous, Q6H PRN, George Hugh, MD   pantoprazole (PROTONIX) injection 40 mg, 40 mg, Intravenous, Q12H, George Hugh, MD, 40 mg at 12/20/21 0928    [COMPLETED] polyethylene glycol (MIRALAX / GLYCOLAX) packet 17 g, 17 g, Oral, BID, 17 g at 12/19/21 2052 **FOLLOWED BY** polyethylene glycol (MIRALAX / GLYCOLAX) packet 17 g, 17 g, Oral, BID PRN, Cyndia Skeeters, Taye T, MD   pravastatin (PRAVACHOL) tablet 10 mg, 10 mg, Oral, QHS, Wouk, Ailene Rud, MD, 10 mg at 12/19/21 2052   predniSONE (DELTASONE) tablet 20 mg, 20 mg, Oral, Q breakfast, Ottie Glazier, MD, 20 mg at 12/20/21 1610   prochlorperazine (COMPAZINE) injection 5 mg, 5 mg, Intravenous, Q6H PRN, Cyndia Skeeters, Taye T, MD   [COMPLETED] senna-docusate (Senokot-S) tablet 1 tablet, 1 tablet, Oral, BID, 1 tablet at 12/19/21 2052 **FOLLOWED BY** senna-docusate (Senokot-S) tablet 1 tablet, 1 tablet, Oral, BID PRN, Mercy Riding, MD   spironolactone (ALDACTONE) tablet 25 mg, 25 mg, Oral, Daily, Lanney Gins, Cheralyn Oliver, MD, 25 mg at 12/20/21 0929   tamsulosin (FLOMAX) capsule 0.4 mg, 0.4 mg, Oral, QPM, Wouk, Ailene Rud, MD, 0.4 mg at 12/17/21 1707   tiotropium (SPIRIVA) inhalation capsule ([facility] use ONLY) 18 mcg, 18 mcg, Inhalation, Daily, Wouk, Ailene Rud, MD, 18 mcg at 12/20/21 0932   torsemide (DEMADEX) tablet 20 mg, 20 mg, Oral, Daily, Ottie Glazier, MD, 20 mg at 12/20/21 9604   zinc sulfate capsule 220 mg, 220 mg, Oral, Daily, Athena Masse, MD, 220 mg at 12/20/21 5409  Facility-Administered Medications Ordered in Other Encounters:    0.9 %  sodium chloride infusion, , Intravenous, Continuous, Earlie Server, MD, Last Rate: 10 mL/hr at 10/15/20 1452, New Bag at 10/15/20 1452    ALLERGIES   Codeine     REVIEW OF SYSTEMS    Review of Systems:  Gen:  Denies  fever, sweats, chills weigh loss  HEENT: Denies blurred vision, double vision, ear pain, eye pain, hearing loss, nose bleeds, sore throat Cardiac:  No dizziness, chest pain or heaviness, chest tightness,edema Resp:   reports dyspnea chronically now worsened Gi: Denies swallowing difficulty, stomach pain, nausea or vomiting, diarrhea, constipation,  bowel incontinence Gu:  Denies bladder incontinence, burning urine Ext:   Denies Joint pain, stiffness or swelling Skin: Denies  skin rash, easy bruising or bleeding or hives Endoc:  Denies polyuria, polydipsia , polyphagia or weight change Psych:   Denies depression, insomnia or hallucinations   Other:  All other systems negative   VS: BP (!) 157/48 (BP Location: Left Arm)   Pulse 70   Temp 97.9 F (36.6 C)   Resp 19   Ht  $'5\' 5"'i$  (1.651 m)   Wt 77.1 kg   SpO2 99%   BMI 28.29 kg/m      PHYSICAL EXAM    GENERAL:NAD, no fevers, chills, no weakness no fatigue HEAD: Normocephalic, atraumatic.  EYES: Pupils equal, round, reactive to light. Extraocular muscles intact. No scleral icterus.  MOUTH: Moist mucosal membrane. Dentition intact. No abscess noted.  EAR, NOSE, THROAT: Clear without exudates. No external lesions.  NECK: Supple. No thyromegaly. No nodules. No JVD.  PULMONARY: decreased breath sounds on right with mild rhonchi worse at bases bilaterally.  CARDIOVASCULAR: S1 and S2. Regular rate and rhythm. No murmurs, rubs, or gallops. No edema. Pedal pulses 2+ bilaterally.  GASTROINTESTINAL: Soft, nontender, nondistended. No masses. Positive bowel sounds. No hepatosplenomegaly.  MUSCULOSKELETAL: No swelling, clubbing, or edema. Range of motion full in all extremities.  NEUROLOGIC: Cranial nerves II through XII are intact. No gross focal neurological deficits. Sensation intact. Reflexes intact.  SKIN: No ulceration, lesions, rashes, or cyanosis. Skin warm and dry. Turgor intact.  PSYCHIATRIC: Mood, affect within normal limits. The patient is awake, alert and oriented x 3. Insight, judgment intact.       IMAGING     Narrative & Impression  CLINICAL DATA:  Pneumonia, complication suspected, xray done   EXAM: CT CHEST WITHOUT CONTRAST   TECHNIQUE: Multidetector CT imaging of the chest was performed following the standard protocol without IV contrast.   RADIATION DOSE  REDUCTION: This exam was performed according to the departmental dose-optimization program which includes automated exposure control, adjustment of the mA and/or kV according to patient size and/or use of iterative reconstruction technique.   COMPARISON:  10/07/2018, CT abdomen pelvis 03/04/2019   FINDINGS: Cardiovascular: Moderate multi-vessel coronary artery calcification. Global cardiac size within normal limits. No pericardial effusion. Central pulmonary arteries are enlarged in keeping with changes of pulmonary arterial hypertension. Moderate atherosclerotic calcification within the thoracic aorta. No aortic aneurysm.   Mediastinum/Nodes: There is progressive mediastinal shift to the right related to right-sided volume loss. Visualized thyroid is unremarkable. No pathologic thoracic adenopathy. The esophagus is unremarkable. Small hiatal hernia.   Lungs/Pleura: Complex large, posterobasal loculated right pleural effusion is again identified demonstrating heterogeneous attenuation which may relate to proteinaceous or hemorrhagic debris or progressive pleural soft tissue the setting of mesothelioma or metastatic adenocarcinoma. Evaluation is slightly limited by noncontrast technique. The right lower lobe is completely collapsed with extensive airway impaction. There is extensive airway impaction involving the right middle lobar bronchi with dense consolidation of the right middle lobe and near complete collapse. Mild emphysema. No pleural effusion on the left. No pneumothorax.   Upper Abdomen: Cholelithiasis. Hyperdense gallbladder contents may relate to vicarious excretion of contrast or layering sludge. No superimposed pericholecystic inflammatory change. No acute abnormality.   Musculoskeletal: Degenerative changes are noted throughout the thoracolumbar spine with ankylosis of the vertebral bodies of T5-T12. L3 vertebroplasty has been performed. No acute bone abnormality.  No lytic or blastic bone lesion.   IMPRESSION: 1. Enlarging, complex loculated right pleural effusion demonstrating progressive heterogeneous attenuation which may relate to proteinaceous or hemorrhagic debris or progressive pleural soft tissue the setting of mesothelioma or metastatic adenocarcinoma. Repeat contrast enhanced imaging and/or diagnostic thoracentesis may be helpful for further evaluation. 2. Extensive airway impaction involving the right middle and lower lobes with complete collapse the right lower lobe and dense consolidation and near complete collapse of the right middle lobe. Findings may relate to aspiration and/or acute lobar pneumonia. 3. Mild emphysema. 4. Moderate multi-vessel coronary  artery calcification. 5. Cholelithiasis.   Aortic Atherosclerosis (ICD10-I70.0) and Emphysema (ICD10-J43.9).     Electronically Signed   By: Fidela Salisbury M.D.   On: 12/12/2021 03:22         ASSESSMENT/PLAN   Acute on chronic hypoxemic respiratory failure        Due to right pleural effusion with rounded atelectasis and associated compressive atelectasis      - s/p IR evaluation - not a candidate for aspiration of pleural space.       -continue with current antibiotics for empiric pneumonia treatment      -BNP and troponin are within reference range and its unlikely that additional cardiac optimization would help   - he may benefit from thoracic surgery evaluation due to possible fibrothorax with rounded atelectasis and ex-vacuo physiology -patient has improved overnight  Airway impaction of right middle lobe    Will start nebulizer with mucomyst 82m 20%   COPD with centrilobular emphysema  -continue generic COPD care path with  -augmentin  - Prednisone '40mg'$  daily tapering, now down to '10mg'$   - spiriva and albuterol with Duoneb    Thank you for allowing me to participate in the care of this patient.   Patient/Family are satisfied with care plan and all questions  have been answered.    Provider disclosure: Patient with at least one acute or chronic illness or injury that poses a threat to life or bodily function and is being managed actively during this encounter.  All of the below services have been performed independently by signing provider:  review of prior documentation from internal and or external health records.  Review of previous and current lab results.  Interview and comprehensive assessment during patient visit today. Review of current and previous chest radiographs/CT scans. Discussion of management and test interpretation with health care team and patient/family.   This document was prepared using Dragon voice recognition software and may include unintentional dictation errors.     FOttie Glazier M.D.  Division of Pulmonary & Critical Care Medicine

## 2021-12-20 NOTE — TOC Progression Note (Addendum)
Transition of Care [facility]) - Progression Note    Patient Details  Name: Arthur [name] MRN: [id number] Date of Birth: 09/08/1933  Transition of Care [facility]) CM/SW [place], [place] Phone Number: 12/20/2021, 1:19 PM  Clinical Narrative:      TOC has submitted the [employer] authorization for [employer].   The patient needs MD progress notes, PT/OT notes and daily nursing needs notes submitted to the insurance authorization portal.    Expected Discharge Plan and Services          Living arrangements for the past 2 months: [facility] authorization             Social Determinants of Health (SDOH) Interventions    Readmission Risk Interventions     No data to display

## 2021-12-20 NOTE — Progress Notes (Signed)
PROGRESS NOTE    Arthur [name]  PIR:[id number] DOB: 29-Nov-1933 DOA: 12/12/2021 PCP: Baxter Hire, MD  111A/111A-AA  LOS: 8 days   Brief hospital course:   Assessment & Plan: 88 y.o. male with a PMH of HTN, HL, DM2, Advanced COPD and Chronic Hypoxia on 2L [place] at baseline, OSA on CPAP, Obesity (BMI 28 kg/m2), Diastolic Heart Failure, complex right pleural effusion (last thoracentesis on 01/2020), CKD4 with anemia on EPO injections who presented to the ED on 12/11 with acute dyspnea and admitted for aspiration pneumonia with enlarging right complex pleural effusion, and COVID-19 infection.  CT chest with enlarging complex loculated right pleural effusion and extensive airway impaction involving the RML and bilateral lower lobes with complete collapse of RLL and dense consolidation and near complete collapse of RML concerning for aspiration pneumonia.  He was also found to be COVID positive.  Is being managed for aspiration pneumonia with acute on chronic parapneumonic effusion and COVID-19 infection.  Pulmonology following.   Presumed Aspiration pneumonia:  --completed a course of Augmentin/Unasyn   Enlarging complex parapneumonic pleural effusion: Acute on chronic.   -Pulmonary/IR did not deem candidate for thoracentesis/chest tube due to associated compressive atelectasis. -CXR this morning showed widespread pulmonary density suggesting fluid overload/pulmonary edema -Pulmonology increased diuretics and Aldactone --cont torsemide and aldactone   Nausea and Vomiting: Seems to have resolved.   Acute on chronic hypoxic respiratory failure:  Due to right pleural effusion with rounded atelectasis and associated compressive atelectasis. Stable on home 2 L now. - Patient's oxygen status is back to baseline.   COVID-19 infection:  Not sure this factors into his breathing issue.  Could definitely contribute to his fatigue.   Acute COPD Exacerbation / Centrilobular Emphysema: -Continue  prednisone with taper -Continue breathing treatments   CKD-4: Renal function at baseline.   Controlled IDDM-2 with hyperglycemia and CKD-4: A1c 6.2%. --cont Levemir 10u BID --ACHS and SSI   Physical Deconditioning / Generalized Weakness: - PT/OT recommends SNF and patient was accepted at [facility] status and need for 10 day quarantine, discharge date is planned for 12/21 to [facility].   Hyperkalemia: Resolved   Constipation -Bowel regimen.    DVT prophylaxis: Heparin SQ Code Status: DNR  Family Communication:  Level of care: Med-Surg Dispo:   The patient is from: home Anticipated d/c is to: SNF rehab Anticipated d/c date is: 12/21   Subjective and Interval History:  Pt reported not too short of breath.   Objective: Vitals:   12/19/21 2105 12/20/21 0552 12/20/21 0743 12/20/21 1624  BP: (!) 140/51 (!) 169/55 (!) 157/48 (!) 153/46  Pulse: 69 66 70 66  Resp: '16 18 19 16  '$ Temp: 97.9 F (36.6 C) 98.7 F (37.1 C) 97.9 F (36.6 C) 97.9 F (36.6 C)  TempSrc: Oral     SpO2: 97% 98% 99% 99%  Weight:      Height:        Intake/Output Summary (Last 24 hours) at 12/20/2021 1901 Last data filed at 12/20/2021 1800 Gross per 24 hour  Intake --  Output 2100 ml  Net -2100 ml   Filed Weights   12/11/21 2013 12/12/21 2057  Weight: 77.1 kg 77.1 kg    Examination:   Constitutional: NAD, AAOx3 HEENT: conjunctivae and lids normal, EOMI CV: No cyanosis.   RESP: normal respiratory effort, phlegmy on 2L Extremities: some edema in BLE SKIN: warm, dry Neuro: II - XII grossly intact.   Psych: Normal  mood and affect.  Appropriate judgement and reason   Data Reviewed: I have personally reviewed labs and imaging studies  Time spent: 50 minutes  Enzo Bi, MD [facility] If 7PM-7AM, please contact night-coverage 12/20/2021, 7:01 PM

## 2021-12-20 NOTE — Progress Notes (Addendum)
Physical Therapy Treatment Patient Details Name: Arthur [name] MRN: [id number] DOB: [birth year] Today's Date: 12/20/2021   History of Present Illness Pt. is a 88 y.o. male presenting to hospital 12/10 with c/o weakness and SOB; unable to ambulate.  Pt admitted with COVID infection with secondary bacterial infection, aspiration PNA, recurrent R pleural effusion, and frailty.  PMH includes htn, DM, HLD, COPD, chronic respiratory failure on 2 L home O2, OSA on CPAP, h/o multiple kyphoplasties.    PT Comments    Pt stated he was tired and needed a nap but does agree with encouragement to sit EOB.  He transitions with mod a x 1 and does put effort into reaching rail to assist.  Transitioned to L side today with overall improvement but he remains unable to get fully upright and props himself up on L elbow.  He is able to sit x 5 minutes with occasional post LOB and assist to recover.  Standing attempts are deferred due to poor ability to sit upright.  Will need +2 assist to attempt standing and would recommend Hoyer lift for OOB for nursing needs if needed until strength improves.   He is making small but measurable improvements over the past few day.  Fatigued with effort and unable to tolerate more at this time.   Recommendations for follow up therapy are one component of a multi-disciplinary discharge planning process, led by the attending physician.  Recommendations may be updated based on patient status, additional functional criteria and insurance authorization.  Follow Up Recommendations  Skilled nursing-short term rehab (<3 hours/day)     Assistance Recommended at Discharge Frequent or constant Supervision/Assistance  Patient can return home with the following Two people to help with walking and/or transfers;Two people to help with bathing/dressing/bathroom;Assistance with cooking/housework;Assist for transportation;Help with stairs or ramp for entrance   Equipment Recommendations  Rolling  walker (2 wheels);BSC/3in1;Wheelchair (measurements PT);Wheelchair cushion (measurements PT);Hospital bed    Recommendations for Other Services       Precautions / Restrictions Precautions Precautions: Fall Restrictions Weight Bearing Restrictions: No     Mobility  Bed Mobility Overal bed mobility: Needs Assistance Bed Mobility: Rolling, Supine to Sit, Sit to Supine     Supine to sit: Mod assist Sit to supine: Max assist, Total assist        Transfers                   General transfer comment: unable to sit fully upright.  deferred - will need +2 for any attempts at standing.    Ambulation/Gait                   Stairs             Wheelchair Mobility    Modified Rankin (Stroke Patients Only)       Balance Overall balance assessment: Needs assistance Sitting-balance support: Bilateral upper extremity supported, Feet supported Sitting balance-Leahy Scale: Poor Sitting balance - Comments: left lat lean - 75% upright but unable to get fully upright. Postural control: Left lateral lean                                  Cognition Arousal/Alertness: Awake/alert Behavior During Therapy: WFL for tasks assessed/performed Overall Cognitive Status: Within Functional Limits for tasks assessed  Exercises      General Comments        Pertinent Vitals/Pain Pain Assessment Pain Assessment: Faces Faces Pain Scale: Hurts even more Pain Location: back with transitions Pain Descriptors / Indicators: Sore, Tender, Guarding, Constant Pain Intervention(s): Limited activity within patient's tolerance, Monitored during session, Repositioned    Home Living                          Prior Function            PT Goals (current goals can now be found in the care plan section) Progress towards PT goals: Progressing toward goals    Frequency    Min 2X/week       PT Plan Current plan remains appropriate    Co-evaluation              AM-PAC PT "6 Clicks" Mobility   Outcome Measure  Help needed turning from your back to your side while in a flat bed without using bedrails?: A Lot Help needed moving from lying on your back to sitting on the side of a flat bed without using bedrails?: A Lot Help needed moving to and from a bed to a chair (including a wheelchair)?: Total Help needed standing up from a chair using your arms (e.g., wheelchair or bedside chair)?: Total Help needed to walk in hospital room?: Total Help needed climbing 3-5 steps with a railing? : Total 6 Click Score: 8    End of Session Equipment Utilized During Treatment: Oxygen Activity Tolerance: Patient limited by fatigue;Patient limited by pain Patient left: in bed;with call bell/phone within reach;with bed alarm set Nurse Communication: Mobility status PT Visit Diagnosis: Other abnormalities of gait and mobility (R26.89);Muscle weakness (generalized) (M62.81);Pain     Time: 3748-2707 PT Time Calculation (min) (ACUTE ONLY): 13 min  Charges:  $Therapeutic Activity: 8-22 mins                   Chesley Noon, PTA 12/20/21, 10:37 AM

## 2021-12-20 NOTE — Progress Notes (Signed)
SLP Cancellation Note  Patient Details Name: Arthur [name] MRN: [id number] DOB: [birth year]   Cancelled treatment:       Pt with no overt s/s of dysphagia or aspiration at bedside and per nursing. I sent secure chat to pt's pulmonologist, Dr Ottie Glazier, regarding pt's RLL infiltrates. Per Dr Lanney Gins, his effusion is due to rounded atelectasis and it will never resolve due to negative pressure there constantly creating a vacuum for more fluid."  As his effusion is not likely related to silent aspiration, the order for a Modified Barium Swallow Study was discharged.    [name], M.S., CCC-SLP, Mining engineer Certified Brain Injury [place]  [place] Office [phone] Ascom [phone] Fax [phone]

## 2021-12-21 DIAGNOSIS — J690 Pneumonitis due to inhalation of food and vomit: Secondary | ICD-10-CM | POA: Diagnosis not present

## 2021-12-21 LAB — BASIC METABOLIC PANEL
Anion gap: 2 — ABNORMAL LOW (ref 5–15)
BUN: 66 mg/dL — ABNORMAL HIGH (ref 8–23)
CO2: 28 mmol/L (ref 22–32)
Calcium: 7.6 mg/dL — ABNORMAL LOW (ref 8.9–10.3)
Chloride: 109 mmol/L (ref 98–111)
Creatinine, Ser: 2.28 mg/dL — ABNORMAL HIGH (ref 0.61–1.24)
GFR, Estimated: 27 mL/min — ABNORMAL LOW (ref >60)
Glucose, Bld: 71 mg/dL (ref 70–99)
Potassium: 5.2 mmol/L — ABNORMAL HIGH (ref 3.5–5.1)
Sodium: 139 mmol/L (ref 135–145)

## 2021-12-21 LAB — GLUCOSE, CAPILLARY
Glucose-Capillary: 58 mg/dL — ABNORMAL LOW (ref 70–99)
Glucose-Capillary: 112 mg/dL — ABNORMAL HIGH (ref 70–99)
Glucose-Capillary: 182 mg/dL — ABNORMAL HIGH (ref 70–99)
Glucose-Capillary: 154 mg/dL — ABNORMAL HIGH (ref 70–99)
Glucose-Capillary: 146 mg/dL — ABNORMAL HIGH (ref 70–99)

## 2021-12-21 LAB — CBC
HCT: 27.1 % — ABNORMAL LOW (ref 39.0–52.0)
Hemoglobin: 7.7 g/dL — ABNORMAL LOW (ref 13.0–17.0)
MCH: 26.9 pg (ref 26.0–34.0)
MCHC: 28.4 g/dL — ABNORMAL LOW (ref 30.0–36.0)
MCV: 94.8 fL (ref 80.0–100.0)
Platelets: 274 10*3/uL (ref 150–400)
RBC: 2.86 MIL/uL — ABNORMAL LOW (ref 4.22–5.81)
RDW: 18.9 % — ABNORMAL HIGH (ref 11.5–15.5)
WBC: 7.8 10*3/uL (ref 4.0–10.5)
nRBC: 0.5 % — ABNORMAL HIGH (ref 0.0–0.2)

## 2021-12-21 LAB — MAGNESIUM: Magnesium: 2.6 mg/dL — ABNORMAL HIGH (ref 1.7–2.4)

## 2021-12-21 MED ORDER — ACETYLCYSTEINE 20 % IN SOLN: 4.0000 mL | Freq: Two times a day (BID) | RESPIRATORY_TRACT | Status: DC

## 2021-12-21 MED ORDER — IPRATROPIUM-ALBUTEROL 0.5-2.5 (3) MG/3ML IN SOLN: 3.0000 mL | Freq: Two times a day (BID) | RESPIRATORY_TRACT | Status: DC

## 2021-12-21 MED ORDER — INSULIN DETEMIR 100 UNIT/ML ~~LOC~~ SOLN
5.0000 [IU] | Freq: Two times a day (BID) | SUBCUTANEOUS | Status: DC
  Administered 2021-12-22 – 2021-12-23 (×4): 5 [IU] via SUBCUTANEOUS
  Filled 2021-12-21 (×5): qty 0.05

## 2021-12-21 MED ORDER — INSULIN DETEMIR 100 UNIT/ML ~~LOC~~ SOLN: 5.0000 [IU] | Freq: Two times a day (BID) | SUBCUTANEOUS | Status: DC

## 2021-12-21 MED ORDER — STERILE WATER FOR INJECTION IJ SOLN
INTRAMUSCULAR | Status: AC
  Administered 2021-12-22: 10 mL via INTRAVENOUS
  Filled 2021-12-21: qty 10

## 2021-12-21 NOTE — Progress Notes (Addendum)
Palliative: Chart review completed.  He is COVID-positive with the need for 10 days of quarantine.  Discharge date is planned for 12/22/2021 to [facility] for rehab.  Conference with attending, bedside nursing staff, transition of care team related to patient condition, needs, goals of care, disposition.    Plan: Continue to treat the treatable but no CPR or intubation.  Time for outcomes.  Short-term rehab, awaiting insurance authorization. Addendum: Mr. Murch has been active with [facility] for outpatient palliative services since August 2020.  They will continue to follow and support him.  No charge  Quinn Axe, NP Palliative medicine team Team phone [phone] Greater than 50% of that time was spent counseling and coordinating care related to the above assessment and plan.

## 2021-12-21 NOTE — Progress Notes (Signed)
Patient requesting to have staff feed him all his meals despite being functionally able to do so himself. Patient noted to be feeding himself with family at bedside during dinner meal.

## 2021-12-21 NOTE — Progress Notes (Signed)
PT Cancellation Note  Patient Details Name: Arthur [name] MRN: [id number] DOB: 05/04/1933   Cancelled Treatment:     Pt declined due to not feeling well, nursing in room. Will continue as able next available time/date per POC.   Josie Dixon 12/21/2021, 2:47 PM

## 2021-12-21 NOTE — Progress Notes (Signed)
MD notified of hypoglycemia. Patient asymptomatic. Patient given 242m of orange juice. Awaiting breakfast tray. Will re check blood glucose.

## 2021-12-21 NOTE — Progress Notes (Signed)
PROGRESS NOTE    Arthur [name]  TOI:[id number] DOB: 01/11/1933 DOA: 12/12/2021 PCP: Baxter Hire, MD  111A/111A-AA  LOS: 9 days   Brief hospital course:   Assessment & Plan: 88 y.o. male with a PMH of HTN, HL, DM2, Advanced COPD and Chronic Hypoxia on 2L [place] at baseline, OSA on CPAP, Obesity (BMI 28 kg/m2), Diastolic Heart Failure, complex right pleural effusion (last thoracentesis on 01/2020), CKD4 with anemia on EPO injections who presented to the ED on 12/11 with acute dyspnea and admitted for aspiration pneumonia with enlarging right complex pleural effusion, and COVID-19 infection.  CT chest with enlarging complex loculated right pleural effusion and extensive airway impaction involving the RML and bilateral lower lobes with complete collapse of RLL and dense consolidation and near complete collapse of RML concerning for aspiration pneumonia.  He was also found to be COVID positive.  Is being managed for aspiration pneumonia with acute on chronic parapneumonic effusion and COVID-19 infection.  Pulmonology following.   Presumed Aspiration pneumonia:  --completed a course of Augmentin/Unasyn   Enlarging complex parapneumonic pleural effusion: Acute on chronic.   -Pulmonary/IR did not deem candidate for thoracentesis/chest tube due to associated compressive atelectasis. -CXR this morning showed widespread pulmonary density suggesting fluid overload/pulmonary edema -Pulmonology increased diuretics and Aldactone Plan: --d/c aldactone today due to hyperkalemia --cont torsemide   Nausea and Vomiting: resolved.   Acute on chronic hypoxic respiratory failure:  Due to right pleural effusion with rounded atelectasis and associated compressive atelectasis. - Patient's oxygen status is back to baseline.   COVID-19 infection:  Not sure this factors into his breathing issue.  Could definitely contribute to his fatigue.   Acute COPD Exacerbation / Centrilobular Emphysema: -Continue  prednisone with taper -Continue breathing treatments --cont mucomyst BID   CKD-4: Renal function at baseline.   Controlled IDDM-2 with hyperglycemia and hypoglycemia  and CKD-4: A1c 6.2%. --reduce Levemir to 5u BID --ACHS and SSI   Physical Deconditioning / Generalized Weakness: - PT/OT recommends SNF and patient was accepted at [facility] status and need for 10 day quarantine, discharge date is planned for 12/21 to [facility].   Hyperkalemia: Resolved   Constipation -Bowel regimen.    DVT prophylaxis: Heparin SQ Code Status: DNR  Family Communication:  Level of care: Med-Surg Dispo:   The patient is from: home Anticipated d/c is to: SNF rehab Anticipated d/c date is: 12/21   Subjective and Interval History:  Pt reported doing fine, and said he was ready to go home, but then recognized that he is not able to take care of himself currently.     Objective: Vitals:   12/20/21 1624 12/20/21 2052 12/21/21 0826 12/21/21 1738  BP: (!) 153/46 (!) 144/46 (!) 164/46 (!) 163/56  Pulse: 66 65 65 60  Resp: '16 18 20 18  '$ Temp: 97.9 F (36.6 C) 98.4 F (36.9 C) 97.9 F (36.6 C) 98 F (36.7 C)  TempSrc:  Oral    SpO2: 99% 100% 98% 90%  Weight:      Height:        Intake/Output Summary (Last 24 hours) at 12/21/2021 1858 Last data filed at 12/21/2021 1814 Gross per 24 hour  Intake 640 ml  Output 1800 ml  Net -1160 ml   Filed Weights   12/11/21 2013 12/12/21 2057  Weight: 77.1 kg 77.1 kg    Examination:   Constitutional: NAD, AAOx3 HEENT: conjunctivae and lids normal, EOMI CV: No cyanosis.  RESP: normal respiratory effort, phlegmy, on RA Neuro: II - XII grossly intact.     Data Reviewed: I have personally reviewed labs and imaging studies  Time spent: 35 minutes  Enzo Bi, MD [facility] If 7PM-7AM, please contact night-coverage 12/21/2021, 6:58 PM

## 2021-12-21 NOTE — Progress Notes (Signed)
Occupational Therapy Treatment Patient Details Name: Arthur [name] MRN: [id number] DOB: 05/19/1933 Today's Date: 12/21/2021   History of present illness Pt. is a 88 y.o. male presenting to hospital 12/10 with c/o weakness and SOB; unable to ambulate.  Pt admitted with COVID infection with secondary bacterial infection, aspiration PNA, recurrent R pleural effusion, and frailty.  PMH includes htn, DM, HLD, COPD, chronic respiratory failure on 2 L home O2, OSA on CPAP, h/o multiple kyphoplasties.   OT comments  Upon entering the room, pt supine in bed and needing maximal encouragement for participation. Supine >sit with max A to EOB. Once pt's LEs are off bed pt begins to call out about back pain and demanding to return to bed. Total A for sit >supine and total A to reposition in bed. RN contacted OT about pt needing assistance to feed self. Pt has physical ability to feed self but declines to do so.Pt reports his daughter feeds him at home. OT educated how to utilize incentive spirometer and flutter valve with pt demonstrating 5 reps of each. Pt very self limiting throughout entire session. He even asked therapist to pick his hand up to put it under the blanket. OT providing encouragement for him to do tasks himself. Pt would benefit from SNF at discharge to address functional deficits.   Recommendations for follow up therapy are one component of a multi-disciplinary discharge planning process, led by the attending physician.  Recommendations may be updated based on patient status, additional functional criteria and insurance authorization.    Follow Up Recommendations  Skilled nursing-short term rehab (<3 hours/day)     Assistance Recommended at Discharge Frequent or constant Supervision/Assistance  Patient can return home with the following  Assistance with cooking/housework;Assist for transportation;Help with stairs or ramp for entrance;Two people to help with bathing/dressing/bathroom;Two people  to help with walking and/or transfers   Equipment Recommendations  Other (comment) (defer to next venue of care)       Precautions / Restrictions Precautions Precautions: Fall       Mobility Bed Mobility Overal bed mobility: Needs Assistance Bed Mobility: Supine to Sit, Sit to Supine     Supine to sit: Max assist Sit to supine: Total assist        Transfers                   General transfer comment: pt refusal     Balance Overall balance assessment: Needs assistance Sitting-balance support: Bilateral upper extremity supported, Feet supported Sitting balance-Leahy Scale: Poor Sitting balance - Comments: left lat lean - 75% upright but unable to get fully upright.                                   ADL either performed or assessed with clinical judgement    Extremity/Trunk Assessment Upper Extremity Assessment Upper Extremity Assessment: Generalized weakness   Lower Extremity Assessment Lower Extremity Assessment: Generalized weakness        Vision Baseline Vision/History: 1 Wears glasses Patient Visual Report: No change from baseline            Cognition Arousal/Alertness: Awake/alert Behavior During Therapy: WFL for tasks assessed/performed Overall Cognitive Status: Within Functional Limits for tasks assessed  Pertinent Vitals/ Pain       Pain Assessment Pain Assessment: Faces Faces Pain Scale: Hurts even more Pain Location: back with transitions Pain Descriptors / Indicators: Sore, Tender, Guarding, Constant Pain Intervention(s): Limited activity within patient's tolerance, Monitored during session, Repositioned         Frequency  Min 2X/week        Progress Toward Goals  OT Goals(current goals can now be found in the care plan section)  Progress towards OT goals: Progressing toward goals  Acute Rehab OT Goals Patient Stated Goal: to  decrease pain OT Goal Formulation: With patient Time For Goal Achievement: 12/26/21 Potential to Achieve Goals: [place] Discharge plan remains appropriate;Frequency remains appropriate       AM-PAC OT "6 Clicks" Daily Activity     Outcome Measure   Help from another person eating meals?: A Lot Help from another person taking care of personal grooming?: A Lot Help from another person toileting, which includes using toliet, bedpan, or urinal?: Total Help from another person bathing (including washing, rinsing, drying)?: Total Help from another person to put on and taking off regular upper body clothing?: A Lot Help from another person to put on and taking off regular lower body clothing?: Total 6 Click Score: 9    End of Session Equipment Utilized During Treatment: Oxygen;Gait belt;Rolling walker (2 wheels)  OT Visit Diagnosis: Other abnormalities of gait and mobility (R26.89);Muscle weakness (generalized) (M62.81);Pain   Activity Tolerance Patient limited by pain   Patient Left in bed;with call bell/phone within reach;with bed alarm set   Nurse Communication Mobility status        Time: 3903-0092 OT Time Calculation (min): 17 min  Charges: OT General Charges $OT Visit: 1 Visit OT Treatments $Therapeutic Activity: 8-22 mins  Darleen Crocker, MS, OTR/L , CBIS ascom [phone]  12/21/21, 3:35 PM

## 2021-12-21 NOTE — Progress Notes (Signed)
PULMONOLOGY         Date: 12/21/2021,   MRN# [id number] Arthur [name] [birth year]     AdmissionWeight: 77.1 kg                 CurrentWeight: 77.1 kg  Referring provider: Dr Si Raider   CHIEF COMPLAINT:   Large right pleural effusion   HISTORY OF PRESENT ILLNESS    HPI: Arthur [name] is a 88 y.o. male with medical history significant for HTN, BPH CKD 4, anemia of chronic disease on EPO shots by oncology, insulin-dependent type 2 diabetes, diastolic CHF with history of pleural effusion with last thoracentesis 01/2020, advanced COPD, chronic respiratory failure on home O2 at 2 L, and OSA on CPAP followed by pulmonology, and who at baseline is chronically dyspneic per review of pulmonology notes who presents to the ED with worsening shortness of breath and generalized weakness to where he is unable to ambulate.  He denies fever or chills or chest pain.  Denies pain in the lower extremities and denies swelling beyond his baseline. ED course and data review: BP on arrival 126/47 with mild tachypnea of 22 and O2 sat in the high 90s on home flow rate of 2 L. Labs with normal WBC, hemoglobin at baseline at 8.  BMP with creatinine 2.88 which is his baseline, troponin 12 and BNP 54.  Urinalysis unremarkable.  COVID and flu pending. EKG, personally viewed and interpreted showing sinus at 88 with no acute ST-T wave changes.   CT chest without contrast shows a loculated right pleural effusion, and extensive airway impaction with complete collapse of the right lower lobe and near complete collapse of right middle lobe consistent with aspiration and/or acute lobar pneumonia as further outlined below: IMPRESSION: 1. Enlarging, complex loculated right pleural effusion demonstrating progressive heterogeneous attenuation which may relate to proteinaceous or hemorrhagic debris or progressive pleural soft tissue the setting of mesothelioma or metastatic adenocarcinoma. Repeat contrast enhanced imaging  and/or diagnostic thoracentesis may be helpful for further evaluation. 2. Extensive airway impaction involving the right middle and lower lobes with complete collapse the right lower lobe and dense consolidation and near complete collapse of the right middle lobe. Findings may relate to aspiration and/or acute lobar pneumonia. 3. Mild emphysema. 4. Moderate multi-vessel coronary artery calcification. 5. Cholelithiasis.  Pulmonology consulted for additional evaluation of acute on chronic hypoxemia.    12/13/21- patient has improved overnight, weaning on O2 support. He is making adequate urine and reports less cough and less dyspnea.   12/14/21- patient is now on room air.  He is speaking in full sentences. Currently saturating >96% without supplemental O2.  He is negative 1.6L.  He is stable for PT/OT and dc planning.  He is using inhalers as prescribed. He using IS and flutter.  We have weaned prednisone to '30mg'$  daily and reduced abs to augmentin 500 tid.   12/15/21- patient is doing well this am. He has plan for SNF placement.  He is now stable and PCCM will sign off and are available as needed.   12/19/21- Asked by daughter to come back and re-evaluate patient due to worsening respiratory status.  Had performed CXR today.  There is worsening infiltrates and effusion.  I have increased diuresis with torsemide and aldactone.   12/20/21 - patient is negative 11L and feels much improved. He requests to go home. K is mildly elevated we have dcd aldactone for now. Working on [employer].   PAST MEDICAL  HISTORY   Past Medical History:  Diagnosis Date   Anemia of chronic kidney failure 08/21/2018   Arthritis    lower back   CHF (congestive heart failure) (HCC)    CKD (chronic kidney disease), stage III (HCC)    COPD (chronic obstructive pulmonary disease) ([place])    Diabetes mellitus without complication ([place])    type 2   Dyspnea    when active -wears oxygen 2L via [place]   History of blood  transfusion    Hyperlipidemia    Hypertension    OSA on CPAP 12/12/2021   Requires continuous at home supplemental oxygen    2L via [place]   Squamous cell carcinoma in situ 09/17/2012   Right upper nasal dorsum. SCCis arising in [place]   Squamous cell carcinoma of skin 02/03/2020   R lat cheek, EDC     SURGICAL HISTORY   Past Surgical History:  Procedure Laterality Date   COLONOSCOPY WITH PROPOFOL N/A 06/11/2018   Procedure: COLONOSCOPY WITH PROPOFOL;  Surgeon: Jonathon Bellows, MD;  Location: [facility] ENDOSCOPY;  Service: Gastroenterology;  Laterality: N/A;   COLONOSCOPY WITH PROPOFOL N/A 06/13/2018   Procedure: COLONOSCOPY WITH PROPOFOL;  Surgeon: Jonathon Bellows, MD;  Location: [facility] ENDOSCOPY;  Service: Gastroenterology;  Laterality: N/A;   ESOPHAGOGASTRODUODENOSCOPY Left 06/10/2018   Procedure: ESOPHAGOGASTRODUODENOSCOPY (EGD);  Surgeon: Jonathon Bellows, MD;  Location: [facility] ENDOSCOPY;  Service: Gastroenterology;  Laterality: Left;   EYE SURGERY     removed cataracts   GIVENS CAPSULE STUDY N/A 06/28/2018   Procedure: GIVENS CAPSULE STUDY;  Surgeon: Lucilla Lame, MD;  Location: [facility] ENDOSCOPY;  Service: Endoscopy;  Laterality: N/A;   GIVENS CAPSULE STUDY N/A 06/30/2018   Procedure: GIVENS CAPSULE STUDY;  Surgeon: Jonathon Bellows, MD;  Location: [facility] ENDOSCOPY;  Service: Gastroenterology;  Laterality: N/A;   KYPHOPLASTY N/A 10/31/2018   Procedure: KYPHOPLASTY L2;  Surgeon: [name], MD;  Location: [facility] ORS;  Service: Orthopedics;  Laterality: N/A;   KYPHOPLASTY N/A 12/24/2018   Procedure: KYPHOPLASTY L3;  Surgeon: [name], MD;  Location: [facility] ORS;  Service: Orthopedics;  Laterality: N/A;   KYPHOPLASTY N/A 02/18/2019   Procedure: L2 KYPHOPLASTY;  Surgeon: [name], MD;  Location: [facility] ORS;  Service: Orthopedics;  Laterality: N/A;   RADIOLOGY WITH ANESTHESIA N/A 01/28/2019   Procedure: MRI LUMBER SPINE WITHOUT CONTRAST;  Surgeon: Radiologist, Medication, MD;  Location: [place];  Service: Radiology;   Laterality: N/A;     FAMILY HISTORY   Family History  Problem Relation Age of Onset   COPD Mother    Cancer Father      SOCIAL HISTORY   Social History   Tobacco Use   Smoking status: Former    Packs/day: 2.00    Years: 35.00    Total pack years: 70.00    Types: Cigarettes    Quit date: 11/04/1982    Years since quitting: 39.1   Smokeless tobacco: Current    Types: Chew  Vaping Use   Vaping Use: Never used  Substance Use Topics   Alcohol use: Not Currently   Drug use: No     MEDICATIONS    Home Medication:     Current Medication:  Current Facility-Administered Medications:    acetaminophen (TYLENOL) tablet 650 mg, 650 mg, Oral, Q6H PRN **OR** acetaminophen (TYLENOL) suppository 650 mg, 650 mg, Rectal, Q6H PRN, Athena Masse, MD   albuterol (VENTOLIN HFA) 108 (90 Base) MCG/ACT inhaler 2 puff, 2 puff, Inhalation, Q6H, Athena Masse, MD, 2 puff at 12/21/21 [phone]  ascorbic acid (VITAMIN C) tablet 500 mg, 500 mg, Oral, Daily, Judd Gaudier V, MD, 500 mg at 12/21/21 0943   guaiFENesin-dextromethorphan (ROBITUSSIN DM) 100-10 MG/5ML syrup 5 mL, 5 mL, Oral, Q4H PRN, Wyvonnia Dusky, MD, 5 mL at 12/16/21 2249   heparin injection 5,000 Units, 5,000 Units, Subcutaneous, Q8H, Wouk, Ailene Rud, MD, 5,000 Units at 12/21/21 0223   insulin aspart (novoLOG) injection 0-9 Units, 0-9 Units, Subcutaneous, TID WC, Wouk, Ailene Rud, MD, 2 Units at 12/21/21 1143   insulin detemir (LEVEMIR) injection 5 Units, 5 Units, Subcutaneous, BID, Enzo Bi, MD   ondansetron [facility]) tablet 4 mg, 4 mg, Oral, Q6H PRN **OR** ondansetron (ZOFRAN) injection 4 mg, 4 mg, Intravenous, Q6H PRN, Athena Masse, MD, 4 mg at 12/19/21 2052   ondansetron (ZOFRAN) injection 4 mg, 4 mg, Intravenous, Q6H PRN, George Hugh, MD   pantoprazole (PROTONIX) injection 40 mg, 40 mg, Intravenous, Q12H, George Hugh, MD, 40 mg at 12/21/21 0942   [COMPLETED] polyethylene glycol (MIRALAX / GLYCOLAX) packet 17  g, 17 g, Oral, BID, 17 g at 12/19/21 2052 **FOLLOWED BY** polyethylene glycol (MIRALAX / GLYCOLAX) packet 17 g, 17 g, Oral, BID PRN, Cyndia Skeeters, Taye T, MD   pravastatin (PRAVACHOL) tablet 10 mg, 10 mg, Oral, QHS, Wouk, Ailene Rud, MD, 10 mg at 12/20/21 2112   predniSONE (DELTASONE) tablet 10 mg, 10 mg, Oral, Q breakfast, Ottie Glazier, MD, 10 mg at 12/21/21 3612   prochlorperazine (COMPAZINE) injection 5 mg, 5 mg, Intravenous, Q6H PRN, Cyndia Skeeters, Taye T, MD   [COMPLETED] senna-docusate (Senokot-S) tablet 1 tablet, 1 tablet, Oral, BID, 1 tablet at 12/19/21 2052 **FOLLOWED BY** senna-docusate (Senokot-S) tablet 1 tablet, 1 tablet, Oral, BID PRN, Mercy Riding, MD   spironolactone (ALDACTONE) tablet 25 mg, 25 mg, Oral, Daily, Lanney Gins, Weslie Pretlow, MD, 25 mg at 12/21/21 0942   tamsulosin (FLOMAX) capsule 0.4 mg, 0.4 mg, Oral, QPM, Wouk, Ailene Rud, MD, 0.4 mg at 12/20/21 1717   tiotropium (SPIRIVA) inhalation capsule ([facility] use ONLY) 18 mcg, 18 mcg, Inhalation, Daily, Wouk, Ailene Rud, MD, 18 mcg at 12/21/21 0947   torsemide (DEMADEX) tablet 20 mg, 20 mg, Oral, Daily, Ottie Glazier, MD, 20 mg at 12/21/21 2449   zinc sulfate capsule 220 mg, 220 mg, Oral, Daily, Athena Masse, MD, 220 mg at 12/21/21 7530  Facility-Administered Medications Ordered in Other Encounters:    0.9 %  sodium chloride infusion, , Intravenous, Continuous, Earlie Server, MD, Last Rate: 10 mL/hr at 10/15/20 1452, New Bag at 10/15/20 1452    ALLERGIES   Codeine     REVIEW OF SYSTEMS    Review of Systems:  Gen:  Denies  fever, sweats, chills weigh loss  HEENT: Denies blurred vision, double vision, ear pain, eye pain, hearing loss, nose bleeds, sore throat Cardiac:  No dizziness, chest pain or heaviness, chest tightness,edema Resp:   reports dyspnea chronically now worsened Gi: Denies swallowing difficulty, stomach pain, nausea or vomiting, diarrhea, constipation, bowel incontinence Gu:  Denies bladder incontinence, burning  urine Ext:   Denies Joint pain, stiffness or swelling Skin: Denies  skin rash, easy bruising or bleeding or hives Endoc:  Denies polyuria, polydipsia , polyphagia or weight change Psych:   Denies depression, insomnia or hallucinations   Other:  All other systems negative   VS: BP (!) 164/46 (BP Location: Right Arm)   Pulse 65   Temp 97.9 F (36.6 C)   Resp 20   Ht '5\' 5"'$  (1.651 m)   Wt 77.1 kg  SpO2 98%   BMI 28.29 kg/m      PHYSICAL EXAM    GENERAL:NAD, no fevers, chills, no weakness no fatigue HEAD: Normocephalic, atraumatic.  EYES: Pupils equal, round, reactive to light. Extraocular muscles intact. No scleral icterus.  MOUTH: Moist mucosal membrane. Dentition intact. No abscess noted.  EAR, NOSE, THROAT: Clear without exudates. No external lesions.  NECK: Supple. No thyromegaly. No nodules. No JVD.  PULMONARY: decreased breath sounds on right with mild rhonchi worse at bases bilaterally.  CARDIOVASCULAR: S1 and S2. Regular rate and rhythm. No murmurs, rubs, or gallops. No edema. Pedal pulses 2+ bilaterally.  GASTROINTESTINAL: Soft, nontender, nondistended. No masses. Positive bowel sounds. No hepatosplenomegaly.  MUSCULOSKELETAL: No swelling, clubbing, or edema. Range of motion full in all extremities.  NEUROLOGIC: Cranial nerves II through XII are intact. No gross focal neurological deficits. Sensation intact. Reflexes intact.  SKIN: No ulceration, lesions, rashes, or cyanosis. Skin warm and dry. Turgor intact.  PSYCHIATRIC: Mood, affect within normal limits. The patient is awake, alert and oriented x 3. Insight, judgment intact.       IMAGING     Narrative & Impression  CLINICAL DATA:  Pneumonia, complication suspected, xray done   EXAM: CT CHEST WITHOUT CONTRAST   TECHNIQUE: Multidetector CT imaging of the chest was performed following the standard protocol without IV contrast.   RADIATION DOSE REDUCTION: This exam was performed according to  the departmental dose-optimization program which includes automated exposure control, adjustment of the mA and/or kV according to patient size and/or use of iterative reconstruction technique.   COMPARISON:  10/07/2018, CT abdomen pelvis 03/04/2019   FINDINGS: Cardiovascular: Moderate multi-vessel coronary artery calcification. Global cardiac size within normal limits. No pericardial effusion. Central pulmonary arteries are enlarged in keeping with changes of pulmonary arterial hypertension. Moderate atherosclerotic calcification within the thoracic aorta. No aortic aneurysm.   Mediastinum/Nodes: There is progressive mediastinal shift to the right related to right-sided volume loss. Visualized thyroid is unremarkable. No pathologic thoracic adenopathy. The esophagus is unremarkable. Small hiatal hernia.   Lungs/Pleura: Complex large, posterobasal loculated right pleural effusion is again identified demonstrating heterogeneous attenuation which may relate to proteinaceous or hemorrhagic debris or progressive pleural soft tissue the setting of mesothelioma or metastatic adenocarcinoma. Evaluation is slightly limited by noncontrast technique. The right lower lobe is completely collapsed with extensive airway impaction. There is extensive airway impaction involving the right middle lobar bronchi with dense consolidation of the right middle lobe and near complete collapse. Mild emphysema. No pleural effusion on the left. No pneumothorax.   Upper Abdomen: Cholelithiasis. Hyperdense gallbladder contents may relate to vicarious excretion of contrast or layering sludge. No superimposed pericholecystic inflammatory change. No acute abnormality.   Musculoskeletal: Degenerative changes are noted throughout the thoracolumbar spine with ankylosis of the vertebral bodies of T5-T12. L3 vertebroplasty has been performed. No acute bone abnormality. No lytic or blastic bone lesion.    IMPRESSION: 1. Enlarging, complex loculated right pleural effusion demonstrating progressive heterogeneous attenuation which may relate to proteinaceous or hemorrhagic debris or progressive pleural soft tissue the setting of mesothelioma or metastatic adenocarcinoma. Repeat contrast enhanced imaging and/or diagnostic thoracentesis may be helpful for further evaluation. 2. Extensive airway impaction involving the right middle and lower lobes with complete collapse the right lower lobe and dense consolidation and near complete collapse of the right middle lobe. Findings may relate to aspiration and/or acute lobar pneumonia. 3. Mild emphysema. 4. Moderate multi-vessel coronary artery calcification. 5. Cholelithiasis.   Aortic Atherosclerosis (ICD10-I70.0) and Emphysema (  ICD10-J43.9).     Electronically Signed   By: Fidela Salisbury M.D.   On: 12/12/2021 03:22         ASSESSMENT/PLAN   Acute on chronic hypoxemic respiratory failure        Due to right pleural effusion with rounded atelectasis and associated compressive atelectasis      - s/p IR evaluation - not a candidate for aspiration of pleural space.       -continue with current antibiotics for empiric pneumonia treatment      -BNP and troponin are within reference range and its unlikely that additional cardiac optimization would help   - he may benefit from thoracic surgery evaluation due to possible fibrothorax with rounded atelectasis and ex-vacuo physiology -patient has improved overnight  Airway impaction of right middle lobe    Will start nebulizer with mucomyst 10m 20%   COPD with centrilobular emphysema  -continue generic COPD care path with  -augmentin  - Prednisone '40mg'$  daily tapering, now down to '10mg'$   - spiriva and albuterol with Duoneb    Thank you for allowing me to participate in the care of this patient.   Patient/Family are satisfied with care plan and all questions have been answered.    Provider  disclosure: Patient with at least one acute or chronic illness or injury that poses a threat to life or bodily function and is being managed actively during this encounter.  All of the below services have been performed independently by signing provider:  review of prior documentation from internal and or external health records.  Review of previous and current lab results.  Interview and comprehensive assessment during patient visit today. Review of current and previous chest radiographs/CT scans. Discussion of management and test interpretation with health care team and patient/family.   This document was prepared using Dragon voice recognition software and may include unintentional dictation errors.     FOttie Glazier M.D.  Division of Pulmonary & Critical Care Medicine

## 2021-12-21 NOTE — TOC Progression Note (Addendum)
Transition of Care [facility]) - Progression Note    Patient Details  Name: Arthur [name] MRN: [id number] Date of Birth: 06/11/1933  Transition of Care [facility] CM/SW [place], [place] Phone Number: 12/21/2021, 10:57 AM  Clinical Narrative:     Additional progress notes have been uploaded for this patient;s insurance authorization. [facility] Medicare authorization is pending.  The SNF is also asking about whether or not the patient will need IV antibiotics. TOC has reached out to the care team about whether or not IV ABX will be needed.       Expected Discharge Plan and Services       Living arrangements for the past 2 months: Single Family Home                        SNF authorization               Social Determinants of Health (SDOH) Interventions SDOH Screenings   Food Insecurity: No Food Insecurity (12/13/2021)  Housing: Low Risk  (12/13/2021)  Transportation Needs: No Transportation Needs (12/13/2021)  Utilities: Not At Risk (12/13/2021)  Depression (PHQ2-9): Low Risk  (07/19/2018)  Financial Resource Strain: Low Risk  (07/12/2018)  Physical Activity: Unknown (06/27/2018)  Stress: No Stress Concern Present (06/27/2018)  Tobacco Use: High Risk (12/13/2021)    Readmission Risk Interventions     No data to display

## 2021-12-22 DIAGNOSIS — J690 Pneumonitis due to inhalation of food and vomit: Secondary | ICD-10-CM | POA: Diagnosis not present

## 2021-12-22 LAB — CBC
HCT: 25.9 % — ABNORMAL LOW (ref 39.0–52.0)
Hemoglobin: 7.8 g/dL — ABNORMAL LOW (ref 13.0–17.0)
MCH: 28.2 pg (ref 26.0–34.0)
MCHC: 30.1 g/dL (ref 30.0–36.0)
MCV: 93.5 fL (ref 80.0–100.0)
Platelets: 260 10*3/uL (ref 150–400)
RBC: 2.77 MIL/uL — ABNORMAL LOW (ref 4.22–5.81)
RDW: 18.6 % — ABNORMAL HIGH (ref 11.5–15.5)
WBC: 7.1 10*3/uL (ref 4.0–10.5)
nRBC: 0.4 % — ABNORMAL HIGH (ref 0.0–0.2)

## 2021-12-22 LAB — GLUCOSE, CAPILLARY
Glucose-Capillary: 103 mg/dL — ABNORMAL HIGH (ref 70–99)
Glucose-Capillary: 148 mg/dL — ABNORMAL HIGH (ref 70–99)
Glucose-Capillary: 129 mg/dL — ABNORMAL HIGH (ref 70–99)
Glucose-Capillary: 113 mg/dL — ABNORMAL HIGH (ref 70–99)

## 2021-12-22 LAB — BASIC METABOLIC PANEL
Anion gap: 6 (ref 5–15)
BUN: 66 mg/dL — ABNORMAL HIGH (ref 8–23)
CO2: 29 mmol/L (ref 22–32)
Calcium: 7.7 mg/dL — ABNORMAL LOW (ref 8.9–10.3)
Chloride: 103 mmol/L (ref 98–111)
Creatinine, Ser: 2.16 mg/dL — ABNORMAL HIGH (ref 0.61–1.24)
GFR, Estimated: 29 mL/min — ABNORMAL LOW (ref >60)
Glucose, Bld: 109 mg/dL — ABNORMAL HIGH (ref 70–99)
Potassium: 5.5 mmol/L — ABNORMAL HIGH (ref 3.5–5.1)
Sodium: 138 mmol/L (ref 135–145)

## 2021-12-22 LAB — MAGNESIUM: Magnesium: 2.5 mg/dL — ABNORMAL HIGH (ref 1.7–2.4)

## 2021-12-22 MED ORDER — SODIUM POLYSTYRENE SULFONATE 15 GM/60ML PO SUSP
15.0000 g | Freq: Once | ORAL | Status: AC
  Administered 2021-12-22: 15 g via ORAL
  Filled 2021-12-22: qty 60

## 2021-12-22 MED ORDER — PANTOPRAZOLE SODIUM 40 MG PO TBEC
40.0000 mg | DELAYED_RELEASE_TABLET | Freq: Two times a day (BID) | ORAL | Status: DC
  Administered 2021-12-23 (×2): 40 mg via ORAL
  Filled 2021-12-22 (×2): qty 1

## 2021-12-22 NOTE — Progress Notes (Signed)
Physical Therapy Treatment Patient Details Name: Arthur [name] MRN: [id number] DOB: 1933-12-29 Today's Date: 12/22/2021   History of Present Illness Pt. is a 88 y.o. male presenting to hospital 12/10 with c/o weakness and SOB; unable to ambulate.  Pt admitted with COVID infection with secondary bacterial infection, aspiration PNA, recurrent R pleural effusion, and frailty.  PMH includes htn, DM, HLD, COPD, chronic respiratory failure on 2 L home O2, OSA on CPAP, h/o multiple kyphoplasties.    PT Comments    Pt received in bed requesting to utilize [facility]. Pt required Mod/MaxA for bed mobility and stand pivot transfers. Unable to maintain sitting balance due to significant Left lateral lean despite manual assist to correct. No c/o dizziness or SOB with transfer. Pt assisted back to bed with full assist for hygiene and positioning in bed. Continue PT per POC, awaiting transition to SNF once medically cleared.    Recommendations for follow up therapy are one component of a multi-disciplinary discharge planning process, led by the attending physician.  Recommendations may be updated based on patient status, additional functional criteria and insurance authorization.  Follow Up Recommendations  Skilled nursing-short term rehab (<3 hours/day) Can patient physically be transported by private vehicle: No   Assistance Recommended at Discharge Frequent or constant Supervision/Assistance  Patient can return home with the following Two people to help with walking and/or transfers;Two people to help with bathing/dressing/bathroom;Assistance with cooking/housework;Assist for transportation;Help with stairs or ramp for entrance   Equipment Recommendations   (TBD at next facility)    Recommendations for Other Services       Precautions / Restrictions Precautions Precautions: Fall Restrictions Weight Bearing Restrictions: No     Mobility  Bed Mobility Overal bed mobility: Needs Assistance Bed  Mobility: Supine to Sit, Sit to Supine Rolling: Mod assist, Max assist   Supine to sit: Max assist Sit to supine: Total assist   General bed mobility comments: Pt unable to give much functional assistance    Transfers Overall transfer level: Needs assistance Equipment used: None Transfers: Sit to/from Stand Sit to Stand: Mod assist, Max assist, From elevated surface Stand pivot transfers: Mod assist, Max assist              Ambulation/Gait                   Stairs             Wheelchair Mobility    Modified Rankin (Stroke Patients Only)       Balance Overall balance assessment: Needs assistance Sitting-balance support: Bilateral upper extremity supported, Feet supported Sitting balance-Leahy Scale: Poor Sitting balance - Comments: left lat lean - 75% upright but unable to get fully upright. Postural control:  (Pt unable to correct L Lateral lean with manual assist) Standing balance support: Bilateral upper extremity supported Standing balance-Leahy Scale: Poor Standing balance comment: consistent mild reto lean, light assist to insure he maintained forward and kept COG over BOS                            Cognition Arousal/Alertness: Awake/alert Behavior During Therapy: WFL for tasks assessed/performed Overall Cognitive Status: Within Functional Limits for tasks assessed                                 General Comments: Oriented to person, place, time (except year), and general situation.  Pt  appearing inconsistent with information during session.        Exercises      General Comments General comments (skin integrity, edema, etc.):  (Pt unable to maintain safe upright posture while sitting on BSC due to significant Left lateral lean)      Pertinent Vitals/Pain Pain Assessment Pain Assessment: No/denies pain    Home Living                          Prior Function            PT Goals (current goals  can now be found in the care plan section) Acute Rehab PT Goals Patient Stated Goal: to improve strength, pain, and mobility    Frequency    Min 2X/week      PT Plan Current plan remains appropriate    Co-evaluation              AM-PAC PT "6 Clicks" Mobility   Outcome Measure  Help needed turning from your back to your side while in a flat bed without using bedrails?: A Lot Help needed moving from lying on your back to sitting on the side of a flat bed without using bedrails?: A Lot Help needed moving to and from a bed to a chair (including a wheelchair)?: Total Help needed standing up from a chair using your arms (e.g., wheelchair or bedside chair)?: Total Help needed to walk in hospital room?: Total Help needed climbing 3-5 steps with a railing? : Total 6 Click Score: 8    End of Session Equipment Utilized During Treatment: Gait belt Activity Tolerance: Patient limited by fatigue Patient left: in bed;with call bell/phone within reach;with bed alarm set Nurse Communication: Mobility status PT Visit Diagnosis: Other abnormalities of gait and mobility (R26.89);Muscle weakness (generalized) (M62.81);Pain     Time: 1640-1710 PT Time Calculation (min) (ACUTE ONLY): 30 min  Charges:  $Therapeutic Activity: 23-37 mins                    Mikel Cella, PTA   Josie Dixon 12/22/2021, 5:20 PM

## 2021-12-22 NOTE — Progress Notes (Signed)
PROGRESS NOTE    Arthur [name]  VFI:[id number] DOB: 07/04/1933 DOA: 12/12/2021 PCP: Baxter Hire, MD  111A/111A-AA  LOS: 10 days   Brief hospital course:   Assessment & Plan: 88 y.o. male with a PMH of HTN, HL, DM2, Advanced COPD and Chronic Hypoxia on 2L [place] at baseline, OSA on CPAP, Obesity (BMI 28 kg/m2), Diastolic Heart Failure, complex right pleural effusion (last thoracentesis on 01/2020), CKD4 with anemia on EPO injections who presented to the ED on 12/11 with acute dyspnea and admitted for aspiration pneumonia with enlarging right complex pleural effusion, and COVID-19 infection.  CT chest with enlarging complex loculated right pleural effusion and extensive airway impaction involving the RML and bilateral lower lobes with complete collapse of RLL and dense consolidation and near complete collapse of RML concerning for aspiration pneumonia.  He was also found to be COVID positive.  Is being managed for aspiration pneumonia with acute on chronic parapneumonic effusion and COVID-19 infection.  Pulmonology following.   Presumed Aspiration pneumonia:  --completed a course of Augmentin/Unasyn   Enlarging complex parapneumonic pleural effusion: Acute on chronic.   -Pulmonary/IR did not deem candidate for thoracentesis/chest tube due to associated compressive atelectasis. -CXR this morning showed widespread pulmonary density suggesting fluid overload/pulmonary edema -Pulmonology increased diuretics and Aldactone, aldactone d/c'ed due to hyperkalemia Plan: --cont torsemide   Nausea and Vomiting: resolved.   Acute on chronic hypoxic respiratory failure:  Due to right pleural effusion with rounded atelectasis and associated compressive atelectasis. - Patient's oxygen status is back to baseline.   COVID-19 infection:  Not sure this factors into his breathing issue.  Could definitely contribute to his fatigue.   Acute COPD Exacerbation / Centrilobular Emphysema: -Continue prednisone  with taper -Continue breathing treatments --mucomyst BID d/c'ed by RT   CKD-4: Renal function at baseline.   Controlled IDDM-2 with hyperglycemia and hypoglycemia  and CKD-4: A1c 6.2%. --cont Levemir as reduced 5u BID --ACHS and SSI   Physical Deconditioning / Generalized Weakness: - PT/OT recommends SNF and patient was accepted at [facility].   Hyperkalemia: Resolved   Constipation -Bowel regimen.    DVT prophylaxis: Heparin SQ Code Status: DNR  Family Communication:  Level of care: Med-Surg Dispo:   The patient is from: home Anticipated d/c is to: SNF rehab Anticipated d/c date is: whenever bed available    Subjective and Interval History:  No change today.  Waiting on SNF rehab bed.   Objective: Vitals:   12/22/21 0633 12/22/21 0741 12/22/21 0741 12/22/21 1609  BP: (!) 167/50 (!) 150/49  (!) 157/49  Pulse: (!) 58 (!) 58 (!) 56 (!) 55  Resp: 16 17  (!) 22  Temp: 98.1 F (36.7 C) 98.2 F (36.8 C)  98.2 F (36.8 C)  TempSrc:  Oral  Oral  SpO2: 91% (!) 87% 92% 91%  Weight:      Height:        Intake/Output Summary (Last 24 hours) at 12/22/2021 1846 Last data filed at 12/22/2021 1514 Gross per 24 hour  Intake 60 ml  Output 1550 ml  Net -1490 ml   Filed Weights   12/11/21 2013 12/12/21 2057  Weight: 77.1 kg 77.1 kg    Examination:   Constitutional: NAD, AAOx3 HEENT: conjunctivae and lids normal, EOMI CV: No cyanosis.   RESP: normal respiratory effort Neuro: II - XII grossly intact.   Psych: flat mood and affect.     Data Reviewed: I have personally reviewed labs and imaging studies  Time spent: 25 minutes  Enzo Bi, MD [facility] If 7PM-7AM, please contact night-coverage 12/22/2021, 6:46 PM

## 2021-12-22 NOTE — Progress Notes (Signed)
PHARMACIST - PHYSICIAN COMMUNICATION   CONCERNING: IV to Oral Route Change Policy  RECOMMENDATION: This patient is receiving pantoprazole by the intravenous route.  Based on criteria approved by the Pharmacy and Therapeutics Committee, the intravenous medication(s) is/are being converted to the equivalent oral dose form(s).   DESCRIPTION: These criteria include: The patient is eating (either orally or via tube) and/or has been taking other orally administered medications for a least 24 hours The patient has no evidence of active gastrointestinal bleeding or impaired GI absorption (gastrectomy, short bowel, patient on TNA or NPO).  If you have questions about this conversion, please contact the Pharmacy Department  '[]'$   [phone] )  [facility] '[x]'$   [phone] )  [facility] '[]'$   [phone] )  [facility] '[]'$   [phone] )  [facility] '[]'$   [phone] )  [place], PharmD, BCPS Clinical Pharmacist  12/22/2021 9:24 AM

## 2021-12-22 NOTE — Progress Notes (Signed)
Palliative: Chart review completed.  Plan is for discharge to rehab.  Insurance authorization pending. DNR/goldenrod form on chart.  No charge Quinn Axe, NP Palliative medicine team Team phone [phone] Greater than 50% of this time was spent counseling and coordinating care related to the above assessment and plan.

## 2021-12-22 NOTE — Plan of Care (Signed)
  Problem: Activity: Goal: Ability to tolerate increased activity will improve Outcome: Progressing   Problem: Clinical Measurements: Goal: Ability to maintain a body temperature in the normal range will improve Outcome: Progressing   Problem: Respiratory: Goal: Ability to maintain adequate ventilation will improve Outcome: Progressing Goal: Ability to maintain a clear airway will improve Outcome: Progressing   Problem: Education: Goal: Knowledge of risk factors and measures for prevention of condition will improve Outcome: Progressing   Problem: Coping: Goal: Psychosocial and spiritual needs will be supported Outcome: Progressing   Problem: Respiratory: Goal: Will maintain a patent airway Outcome: Progressing Goal: Complications related to the disease process, condition or treatment will be avoided or minimized Outcome: Progressing   Problem: Education: Goal: Ability to describe self-care measures that may prevent or decrease complications (Diabetes Survival Skills Education) will improve Outcome: Progressing Goal: Individualized Educational Video(s) Outcome: Progressing   Problem: Coping: Goal: Ability to adjust to condition or change in health will improve Outcome: Progressing   Problem: Fluid Volume: Goal: Ability to maintain a balanced intake and output will improve Outcome: Progressing   Problem: Health Behavior/Discharge Planning: Goal: Ability to identify and utilize available resources and services will improve Outcome: Progressing Goal: Ability to manage health-related needs will improve Outcome: Progressing   Problem: Metabolic: Goal: Ability to maintain appropriate glucose levels will improve Outcome: Progressing   Problem: Nutritional: Goal: Maintenance of adequate nutrition will improve Outcome: Progressing Goal: Progress toward achieving an optimal weight will improve Outcome: Progressing   Problem: Skin Integrity: Goal: Risk for impaired skin  integrity will decrease Outcome: Progressing   Problem: Tissue Perfusion: Goal: Adequacy of tissue perfusion will improve Outcome: Progressing   Problem: Education: Goal: Knowledge of General Education information will improve Description: Including pain rating scale, medication(s)/side effects and non-pharmacologic comfort measures Outcome: Progressing   Problem: Health Behavior/Discharge Planning: Goal: Ability to manage health-related needs will improve Outcome: Progressing   Problem: Clinical Measurements: Goal: Ability to maintain clinical measurements within normal limits will improve Outcome: Progressing Goal: Will remain free from infection Outcome: Progressing Goal: Diagnostic test results will improve Outcome: Progressing Goal: Respiratory complications will improve Outcome: Progressing Goal: Cardiovascular complication will be avoided Outcome: Progressing   Problem: Activity: Goal: Risk for activity intolerance will decrease Outcome: Progressing   Problem: Nutrition: Goal: Adequate nutrition will be maintained Outcome: Progressing   Problem: Coping: Goal: Level of anxiety will decrease Outcome: Progressing   Problem: Elimination: Goal: Will not experience complications related to bowel motility Outcome: Progressing Goal: Will not experience complications related to urinary retention Outcome: Progressing   Problem: Pain Managment: Goal: General experience of comfort will improve Outcome: Progressing   Problem: Safety: Goal: Ability to remain free from injury will improve Outcome: Progressing   Problem: Skin Integrity: Goal: Risk for impaired skin integrity will decrease Outcome: Progressing

## 2021-12-23 DIAGNOSIS — I2489 Other forms of acute ischemic heart disease: Secondary | ICD-10-CM | POA: Diagnosis not present

## 2021-12-23 DIAGNOSIS — J449 Chronic obstructive pulmonary disease, unspecified: Secondary | ICD-10-CM | POA: Diagnosis not present

## 2021-12-23 DIAGNOSIS — Z9981 Dependence on supplemental oxygen: Secondary | ICD-10-CM | POA: Diagnosis not present

## 2021-12-23 DIAGNOSIS — J9621 Acute and chronic respiratory failure with hypoxia: Secondary | ICD-10-CM | POA: Diagnosis not present

## 2021-12-23 DIAGNOSIS — Z66 Do not resuscitate: Secondary | ICD-10-CM | POA: Diagnosis not present

## 2021-12-23 DIAGNOSIS — I130 Hypertensive heart and chronic kidney disease with heart failure and stage 1 through stage 4 chronic kidney disease, or unspecified chronic kidney disease: Secondary | ICD-10-CM | POA: Diagnosis not present

## 2021-12-23 DIAGNOSIS — W19XXXA Unspecified fall, initial encounter: Secondary | ICD-10-CM | POA: Diagnosis not present

## 2021-12-23 DIAGNOSIS — J690 Pneumonitis due to inhalation of food and vomit: Secondary | ICD-10-CM | POA: Diagnosis not present

## 2021-12-23 DIAGNOSIS — R918 Other nonspecific abnormal finding of lung field: Secondary | ICD-10-CM | POA: Diagnosis not present

## 2021-12-23 DIAGNOSIS — E785 Hyperlipidemia, unspecified: Secondary | ICD-10-CM | POA: Diagnosis present

## 2021-12-23 DIAGNOSIS — R0602 Shortness of breath: Secondary | ICD-10-CM | POA: Diagnosis not present

## 2021-12-23 DIAGNOSIS — J439 Emphysema, unspecified: Secondary | ICD-10-CM | POA: Diagnosis not present

## 2021-12-23 DIAGNOSIS — A419 Sepsis, unspecified organism: Secondary | ICD-10-CM | POA: Diagnosis not present

## 2021-12-23 DIAGNOSIS — R7989 Other specified abnormal findings of blood chemistry: Secondary | ICD-10-CM | POA: Diagnosis not present

## 2021-12-23 DIAGNOSIS — J90 Pleural effusion, not elsewhere classified: Secondary | ICD-10-CM | POA: Diagnosis not present

## 2021-12-23 DIAGNOSIS — J189 Pneumonia, unspecified organism: Secondary | ICD-10-CM | POA: Diagnosis not present

## 2021-12-23 DIAGNOSIS — E875 Hyperkalemia: Secondary | ICD-10-CM | POA: Diagnosis not present

## 2021-12-23 DIAGNOSIS — R6520 Severe sepsis without septic shock: Secondary | ICD-10-CM | POA: Diagnosis present

## 2021-12-23 DIAGNOSIS — J441 Chronic obstructive pulmonary disease with (acute) exacerbation: Secondary | ICD-10-CM | POA: Diagnosis present

## 2021-12-23 DIAGNOSIS — I272 Pulmonary hypertension, unspecified: Secondary | ICD-10-CM | POA: Diagnosis present

## 2021-12-23 DIAGNOSIS — R739 Hyperglycemia, unspecified: Secondary | ICD-10-CM | POA: Diagnosis not present

## 2021-12-23 DIAGNOSIS — R531 Weakness: Secondary | ICD-10-CM | POA: Diagnosis not present

## 2021-12-23 DIAGNOSIS — D631 Anemia in chronic kidney disease: Secondary | ICD-10-CM | POA: Diagnosis present

## 2021-12-23 DIAGNOSIS — Z8616 Personal history of COVID-19: Secondary | ICD-10-CM | POA: Diagnosis not present

## 2021-12-23 DIAGNOSIS — E43 Unspecified severe protein-calorie malnutrition: Secondary | ICD-10-CM | POA: Diagnosis not present

## 2021-12-23 DIAGNOSIS — I959 Hypotension, unspecified: Secondary | ICD-10-CM | POA: Diagnosis not present

## 2021-12-23 DIAGNOSIS — R627 Adult failure to thrive: Secondary | ICD-10-CM | POA: Diagnosis present

## 2021-12-23 DIAGNOSIS — E860 Dehydration: Secondary | ICD-10-CM | POA: Diagnosis present

## 2021-12-23 DIAGNOSIS — J9601 Acute respiratory failure with hypoxia: Secondary | ICD-10-CM | POA: Diagnosis not present

## 2021-12-23 DIAGNOSIS — N179 Acute kidney failure, unspecified: Secondary | ICD-10-CM | POA: Diagnosis present

## 2021-12-23 DIAGNOSIS — I5032 Chronic diastolic (congestive) heart failure: Secondary | ICD-10-CM | POA: Diagnosis present

## 2021-12-23 DIAGNOSIS — Z515 Encounter for palliative care: Secondary | ICD-10-CM | POA: Diagnosis not present

## 2021-12-23 DIAGNOSIS — E870 Hyperosmolality and hypernatremia: Secondary | ICD-10-CM | POA: Diagnosis not present

## 2021-12-23 DIAGNOSIS — Z87891 Personal history of nicotine dependence: Secondary | ICD-10-CM | POA: Diagnosis not present

## 2021-12-23 DIAGNOSIS — N184 Chronic kidney disease, stage 4 (severe): Secondary | ICD-10-CM | POA: Diagnosis present

## 2021-12-23 DIAGNOSIS — Z794 Long term (current) use of insulin: Secondary | ICD-10-CM | POA: Diagnosis not present

## 2021-12-23 DIAGNOSIS — J1282 Pneumonia due to coronavirus disease 2019: Secondary | ICD-10-CM | POA: Diagnosis not present

## 2021-12-23 DIAGNOSIS — E1122 Type 2 diabetes mellitus with diabetic chronic kidney disease: Secondary | ICD-10-CM | POA: Diagnosis present

## 2021-12-23 DIAGNOSIS — U071 COVID-19: Secondary | ICD-10-CM | POA: Diagnosis not present

## 2021-12-23 DIAGNOSIS — Z743 Need for continuous supervision: Secondary | ICD-10-CM | POA: Diagnosis not present

## 2021-12-23 DIAGNOSIS — J432 Centrilobular emphysema: Secondary | ICD-10-CM | POA: Diagnosis present

## 2021-12-23 DIAGNOSIS — N2581 Secondary hyperparathyroidism of renal origin: Secondary | ICD-10-CM | POA: Diagnosis present

## 2021-12-23 LAB — CBC
HCT: 24.8 % — ABNORMAL LOW (ref 39.0–52.0)
Hemoglobin: 7.4 g/dL — ABNORMAL LOW (ref 13.0–17.0)
MCH: 27.5 pg (ref 26.0–34.0)
MCHC: 29.8 g/dL — ABNORMAL LOW (ref 30.0–36.0)
MCV: 92.2 fL (ref 80.0–100.0)
Platelets: 251 10*3/uL (ref 150–400)
RBC: 2.69 MIL/uL — ABNORMAL LOW (ref 4.22–5.81)
RDW: 18.9 % — ABNORMAL HIGH (ref 11.5–15.5)
WBC: 6 10*3/uL (ref 4.0–10.5)
nRBC: 0.8 % — ABNORMAL HIGH (ref 0.0–0.2)

## 2021-12-23 LAB — BASIC METABOLIC PANEL
Anion gap: 6 (ref 5–15)
BUN: 66 mg/dL — ABNORMAL HIGH (ref 8–23)
CO2: 29 mmol/L (ref 22–32)
Calcium: 7.6 mg/dL — ABNORMAL LOW (ref 8.9–10.3)
Chloride: 102 mmol/L (ref 98–111)
Creatinine, Ser: 2.01 mg/dL — ABNORMAL HIGH (ref 0.61–1.24)
GFR, Estimated: 32 mL/min — ABNORMAL LOW (ref >60)
Glucose, Bld: 78 mg/dL (ref 70–99)
Potassium: 5 mmol/L (ref 3.5–5.1)
Sodium: 137 mmol/L (ref 135–145)

## 2021-12-23 LAB — GLUCOSE, CAPILLARY
Glucose-Capillary: 76 mg/dL (ref 70–99)
Glucose-Capillary: 145 mg/dL — ABNORMAL HIGH (ref 70–99)

## 2021-12-23 LAB — MAGNESIUM: Magnesium: 2.3 mg/dL (ref 1.7–2.4)

## 2021-12-23 MED ORDER — ACETAMINOPHEN 500 MG PO TABS: 1000.0000 mg | ORAL_TABLET | Freq: Three times a day (TID) | ORAL | Status: DC | PRN

## 2021-12-23 MED ORDER — AMLODIPINE BESYLATE 10 MG PO TABS: 10.0000 mg | ORAL_TABLET | Freq: Every day | ORAL | Status: AC

## 2021-12-23 MED ORDER — AMLODIPINE BESYLATE 10 MG PO TABS
10.0000 mg | ORAL_TABLET | Freq: Every day | ORAL | Status: DC
  Administered 2021-12-23: 10 mg via ORAL
  Filled 2021-12-23: qty 1

## 2021-12-23 NOTE — Plan of Care (Signed)
  Problem: Activity: Goal: Ability to tolerate increased activity will improve Outcome: Progressing   Problem: Clinical Measurements: Goal: Ability to maintain a body temperature in the normal range will improve Outcome: Progressing   Problem: Respiratory: Goal: Ability to maintain adequate ventilation will improve Outcome: Progressing Goal: Ability to maintain a clear airway will improve Outcome: Progressing   Problem: Education: Goal: Knowledge of risk factors and measures for prevention of condition will improve Outcome: Progressing   Problem: Coping: Goal: Psychosocial and spiritual needs will be supported Outcome: Progressing   Problem: Respiratory: Goal: Will maintain a patent airway Outcome: Progressing Goal: Complications related to the disease process, condition or treatment will be avoided or minimized Outcome: Progressing   Problem: Education: Goal: Ability to describe self-care measures that may prevent or decrease complications (Diabetes Survival Skills Education) will improve Outcome: Progressing Goal: Individualized Educational Video(s) Outcome: Progressing   Problem: Coping: Goal: Ability to adjust to condition or change in health will improve Outcome: Progressing   Problem: Fluid Volume: Goal: Ability to maintain a balanced intake and output will improve Outcome: Progressing   Problem: Health Behavior/Discharge Planning: Goal: Ability to identify and utilize available resources and services will improve Outcome: Progressing Goal: Ability to manage health-related needs will improve Outcome: Progressing   Problem: Metabolic: Goal: Ability to maintain appropriate glucose levels will improve Outcome: Progressing   Problem: Nutritional: Goal: Maintenance of adequate nutrition will improve Outcome: Progressing Goal: Progress toward achieving an optimal weight will improve Outcome: Progressing   Problem: Skin Integrity: Goal: Risk for impaired skin  integrity will decrease Outcome: Progressing   Problem: Tissue Perfusion: Goal: Adequacy of tissue perfusion will improve Outcome: Progressing   Problem: Education: Goal: Knowledge of General Education information will improve Description: Including pain rating scale, medication(s)/side effects and non-pharmacologic comfort measures Outcome: Progressing   Problem: Health Behavior/Discharge Planning: Goal: Ability to manage health-related needs will improve Outcome: Progressing   Problem: Clinical Measurements: Goal: Ability to maintain clinical measurements within normal limits will improve Outcome: Progressing Goal: Will remain free from infection Outcome: Progressing Goal: Diagnostic test results will improve Outcome: Progressing Goal: Respiratory complications will improve Outcome: Progressing Goal: Cardiovascular complication will be avoided Outcome: Progressing   Problem: Activity: Goal: Risk for activity intolerance will decrease Outcome: Progressing   Problem: Nutrition: Goal: Adequate nutrition will be maintained Outcome: Progressing   Problem: Coping: Goal: Level of anxiety will decrease Outcome: Progressing   Problem: Elimination: Goal: Will not experience complications related to bowel motility Outcome: Progressing Goal: Will not experience complications related to urinary retention Outcome: Progressing   Problem: Pain Managment: Goal: General experience of comfort will improve Outcome: Progressing   Problem: Safety: Goal: Ability to remain free from injury will improve Outcome: Progressing   Problem: Skin Integrity: Goal: Risk for impaired skin integrity will decrease Outcome: Progressing

## 2021-12-23 NOTE — Discharge Summary (Addendum)
Physician Discharge Summary   VANDERBILT RANIERI  male DOB: 1933/04/29  WNI:[id number]  PCP: Baxter Hire, MD  Admit date: 12/12/2021 Discharge date: 12/23/2021  Admitted From: home Disposition:  SNF rehab CODE STATUS: DNR   Hospital Course:  For full details, please see H&P, progress notes, consult notes and ancillary notes.  Briefly,  Arthur [name] is a 88 y.o. male with a PMH of HTN, DM2, Advanced COPD and Chronic Hypoxia on 2L [place] at baseline, OSA, Diastolic Heart Failure, complex right pleural effusion (last thoracentesis on 01/2020), CKD4 with anemia on EPO injections who presented to the ED on 12/11 with acute dyspnea and admitted for aspiration pneumonia with enlarging right complex pleural effusion, and COVID-19 infection.    CT chest with enlarging complex loculated right pleural effusion and extensive airway impaction involving the RML and bilateral lower lobes with complete collapse of RLL and dense consolidation and near complete collapse of RML concerning for aspiration pneumonia.  He was also found to be COVID positive.  Is being managed for aspiration pneumonia with acute on chronic parapneumonic effusion and COVID-19 infection.  Pulmonology consulted.   Presumed Aspiration pneumonia:  --completed a course of abx with Augmentin/Unasyn   Enlarging complex parapneumonic pleural effusion: Acute on chronic.   -Pulmonary/IR did not deem pt a candidate for thoracentesis/chest tube due to associated compressive atelectasis. -repeat CXR showed more widespread pulmonary density suggesting fluid overload/pulmonary edema -Pulmonology increased diuretics and Aldactone, though aldactone later d/c'ed due to hyperkalemia --cont torsemide   Nausea and Vomiting: resolved.   Acute on chronic hypoxic respiratory failure 2L O2 at baseline --multiple instances of O2 desat to mid-80's as documented on the vital charting.  Due to right pleural effusion with rounded atelectasis and  associated compressive atelectasis. - Patient's oxygen status is back to baseline.   COVID-19 infection:  Not sure this factors into his breathing issue.     Acute COPD Exacerbation  Centrilobular Emphysema: -completed prednisone with taper -Continue Spiriva   CKD-4:  Renal function at baseline.   Controlled IDDM-2 with hyperglycemia and hypoglycemia  and CKD-4: A1c 6.2%. --discharged on home regimen as below.   Physical Deconditioning / Generalized Weakness: - PT/OT recommends SNF    Hyperkalemia: Resolved   HTN --home amlodipine increased from 2.5 mg to 10 mg daily  OSA --pt has a dx of OSA, however, pt refused CPAP, and CPAP was not ordered during this hospitalization.   Unless noted above, medications under "STOP" list are ones pt was not taking PTA.  Discharge Diagnoses:  Principal Problem:   Aspiration pneumonia ([place]) Active Problems:   COVID-19 virus infection   Recurrent right pleural effusion   COPD (chronic obstructive pulmonary disease) (HCC)   Frailty   Chronic respiratory failure with hypoxia (HCC)   CKD (chronic kidney disease) stage 4, GFR 15-29 ml/min (HCC)   Anemia in chronic kidney disease (CKD)   Type II diabetes mellitus ([place])   Long term (current) use of insulin (HCC)   Essential hypertension   OSA on CPAP   Generalized weakness   Pleural effusion   30 Day Unplanned Readmission Risk Score    Flowsheet Row ED to Hosp-Admission (Current) from 12/12/2021 in [place]  30 Day Unplanned Readmission Risk Score (%) 26.52 Filed at 12/23/2021 0801       This score is the patient's risk of an unplanned readmission within 30 days of being discharged (0 -100%). The score is based on dignosis,  age, lab data, medications, orders, and past utilization.   Low:  0-14.9   Medium: 15-21.9   High: 22-29.9   Extreme: 30 and above         Discharge Instructions:  Allergies as of 12/23/2021       Reactions    Codeine Other (See Comments)   Constipation        Medication List     STOP taking these medications    ascorbic acid 500 MG tablet Commonly known as: VITAMIN C   docusate sodium 100 MG capsule Commonly known as: COLACE   metoCLOPramide 5 MG tablet Commonly known as: REGLAN   pantoprazole 40 MG tablet Commonly known as: Protonix   polyethylene glycol powder 17 GM/SCOOP powder Commonly known as: GLYCOLAX/MIRALAX   potassium chloride 10 MEQ tablet Commonly known as: KLOR-CON   spironolactone 25 MG tablet Commonly known as: ALDACTONE   triamcinolone cream 0.5 % Commonly known as: KENALOG       TAKE these medications    acetaminophen 325 MG tablet Commonly known as: TYLENOL Take 2 tablets (650 mg total) by mouth every 6 (six) hours as needed for mild pain (or Fever >/= 101).   albuterol 108 (90 Base) MCG/ACT inhaler Commonly known as: VENTOLIN HFA Inhale 2 puffs into the lungs every 6 (six) hours as needed for wheezing or shortness of breath.   amLODipine 10 MG tablet Commonly known as: NORVASC Take 1 tablet (10 mg total) by mouth daily. What changed:  medication strength how much to take   insulin aspart 100 UNIT/ML injection Commonly known as: novoLOG 4 units prior to meals if sugars above 200   Levemir 100 UNIT/ML injection Generic drug: insulin detemir Inject 0.04 mLs (4 Units total) into the skin at bedtime.   pravastatin 10 MG tablet Commonly known as: PRAVACHOL Take 10 mg by mouth at bedtime.   promethazine 25 MG tablet Commonly known as: PHENERGAN Take 25 mg by mouth every 4 (four) hours as needed for nausea or vomiting.   tamsulosin 0.4 MG Caps capsule Commonly known as: FLOMAX Take 0.4 mg by mouth daily.   tiotropium 18 MCG inhalation capsule Commonly known as: SPIRIVA Place 18 mcg into inhaler and inhale daily.   torsemide 20 MG tablet Commonly known as: DEMADEX Take 20 mg by mouth every Monday, Wednesday, and Friday.   True  Metrix Blood Glucose Test test strip Generic drug: glucose blood 1 each by Other route every morning.         Contact information for follow-up providers     Baxter Hire, MD Follow up.   Specialty: Internal Medicine Contact information: [place] [address] [phone]              Contact information for after-discharge care     [facility] Preferred SNF .   Service: Skilled Nursing Contact information: [place] [phone]                     Allergies  Allergen Reactions   Codeine Other (See Comments)    Constipation     The results of significant diagnostics from this hospitalization (including imaging, microbiology, ancillary and laboratory) are listed below for reference.   Consultations:   Procedures/Studies: DG Abd Portable 1V  Result Date: 12/19/2021 CLINICAL DATA:  Intractable nausea and vomiting EXAM: PORTABLE ABDOMEN -  1 VIEW COMPARISON:  CT 03/04/2019 FINDINGS: There is no evidence of bowel obstruction. There is a right upper quadrant calcification consistent with gallstone seen on recent CT. No radiopaque calculi overlie the kidneys. Multilevel degenerative changes of the spine with bony ankylosis. Chronic compression deformities L2 and L3 with prior vertebroplasty. Diffuse osteopenia. Degenerative changes of the hips. IMPRESSION: No evidence of bowel obstruction. Cholelithiasis. Electronically Signed   By: Maurine Simmering M.D.   On: 12/19/2021 20:30   DG Chest Port 1 View  Result Date: 12/19/2021 CLINICAL DATA:  Aspiration pneumonia EXAM: PORTABLE CHEST 1 VIEW COMPARISON:  12/12/2021 FINDINGS: Cardiomegaly as seen previously. Aortic atherosclerosis. Infiltrate/volume loss in the right lower lung consistent with pneumonia. There is probably some right pleural fluid as well. More  widespread pulmonary density suggest fluid overload/pulmonary edema. No acute bone finding. IMPRESSION: Infiltrate/volume loss in the right lower lung consistent with pneumonia. Probable right effusion. More widespread pulmonary density suggest fluid overload/pulmonary edema. Electronically Signed   By: Nelson Chimes M.D.   On: 12/19/2021 08:56   [place] CHEST (PLEURAL EFFUSION)  Result Date: 12/12/2021 CLINICAL DATA:  Loculated pleural effusion EXAM: CHEST ULTRASOUND COMPARISON:  Multiple priors FINDINGS: Focused sonographic exam of the chest was performed for fluid assessment. In the right chest, there is echogenic debris in the right pleural space with small pockets of fluid. Left chest demonstrates no fluid. IMPRESSION: Chronic/organized echogenic material within the right pleural space with small pockets of fluid. Patient noted to have loculated effusion during last thoracentesis in January 2022. Patient unlikely to receive clinical benefit from thoracentesis or chest tube placement. Electronically Signed   By: Albin Felling M.D.   On: 12/12/2021 12:38   CT Chest Wo Contrast  Result Date: 12/12/2021 CLINICAL DATA:  Pneumonia, complication suspected, xray done EXAM: CT CHEST WITHOUT CONTRAST TECHNIQUE: Multidetector CT imaging of the chest was performed following the standard protocol without IV contrast. RADIATION DOSE REDUCTION: This exam was performed according to the departmental dose-optimization program which includes automated exposure control, adjustment of the mA and/or kV according to patient size and/or use of iterative reconstruction technique. COMPARISON:  10/07/2018, CT abdomen pelvis 03/04/2019 FINDINGS: Cardiovascular: Moderate multi-vessel coronary artery calcification. Global cardiac size within normal limits. No pericardial effusion. Central pulmonary arteries are enlarged in keeping with changes of pulmonary arterial hypertension. Moderate atherosclerotic calcification within the  thoracic aorta. No aortic aneurysm. Mediastinum/Nodes: There is progressive mediastinal shift to the right related to right-sided volume loss. Visualized thyroid is unremarkable. No pathologic thoracic adenopathy. The esophagus is unremarkable. Small hiatal hernia. Lungs/Pleura: Complex large, posterobasal loculated right pleural effusion is again identified demonstrating heterogeneous attenuation which may relate to proteinaceous or hemorrhagic debris or progressive pleural soft tissue the setting of mesothelioma or metastatic adenocarcinoma. Evaluation is slightly limited by noncontrast technique. The right lower lobe is completely collapsed with extensive airway impaction. There is extensive airway impaction involving the right middle lobar bronchi with dense consolidation of the right middle lobe and near complete collapse. Mild emphysema. No pleural effusion on the left. No pneumothorax. Upper Abdomen: Cholelithiasis. Hyperdense gallbladder contents may relate to vicarious excretion of contrast or layering sludge. No superimposed pericholecystic inflammatory change. No acute abnormality. Musculoskeletal: Degenerative changes are noted throughout the thoracolumbar spine with ankylosis of the vertebral bodies of T5-T12. L3 vertebroplasty has been performed. No acute bone abnormality. No lytic or blastic bone lesion. IMPRESSION: 1. Enlarging, complex loculated right pleural effusion demonstrating progressive heterogeneous attenuation which may relate to proteinaceous or hemorrhagic debris or  progressive pleural soft tissue the setting of mesothelioma or metastatic adenocarcinoma. Repeat contrast enhanced imaging and/or diagnostic thoracentesis may be helpful for further evaluation. 2. Extensive airway impaction involving the right middle and lower lobes with complete collapse the right lower lobe and dense consolidation and near complete collapse of the right middle lobe. Findings may relate to aspiration and/or  acute lobar pneumonia. 3. Mild emphysema. 4. Moderate multi-vessel coronary artery calcification. 5. Cholelithiasis. Aortic Atherosclerosis (ICD10-I70.0) and Emphysema (ICD10-J43.9). Electronically Signed   By: Fidela Salisbury M.D.   On: 12/12/2021 03:22   DG Chest Portable 1 View  Result Date: 12/12/2021 CLINICAL DATA:  Generalized weakness EXAM: PORTABLE CHEST 1 VIEW COMPARISON:  01/14/2020 FINDINGS: Cardiac shadow is enlarged. Right-sided basilar consolidation is noted similar to that seen on the prior exam. Previous CT imaging has shown loculated pleural effusion in this region as well. The overall appearance is stable. Mild vascular congestion is noted without significant edema. No new focal infiltrate is noted. IMPRESSION: Persistent consolidation and loculated fluid on the right in the base stable from the prior exam. Mild vascular congestion. Electronically Signed   By: Inez Catalina M.D.   On: 12/12/2021 02:43      Labs: BNP (last 3 results) Recent Labs    12/12/21 0250  BNP 62.7   Basic Metabolic Panel: Recent Labs  Lab 12/18/21 0532 12/19/21 0441 12/21/21 0541 12/22/21 0506 12/23/21 0341  NA 135 141 139 138 137  K 5.0 4.9 5.2* 5.5* 5.0  CL 110 111 109 103 102  CO2 21* '26 28 29 29  '$ GLUCOSE 244* 114* 71 109* 78  BUN 78* 73* 66* 66* 66*  CREATININE 2.13* 2.24* 2.28* 2.16* 2.01*  CALCIUM 7.7* 7.9* 7.6* 7.7* 7.6*  MG  --   --  2.6* 2.5* 2.3   Liver Function Tests: No results for input(s): "AST", "ALT", "ALKPHOS", "BILITOT", "PROT", "ALBUMIN" in the last 168 hours. No results for input(s): "LIPASE", "AMYLASE" in the last 168 hours. No results for input(s): "AMMONIA" in the last 168 hours. CBC: Recent Labs  Lab 12/17/21 0358 12/19/21 0441 12/21/21 0541 12/22/21 0506 12/23/21 0341  WBC 4.7 24.0* 7.8 7.1 6.0  HGB 7.6* 8.3* 7.7* 7.8* 7.4*  HCT 25.3* 27.5* 27.1* 25.9* 24.8*  MCV 93.0 92.6 94.8 93.5 92.2  PLT 175 256 274 260 251   Cardiac Enzymes: No results for  input(s): "CKTOTAL", "CKMB", "CKMBINDEX", "TROPONINI" in the last 168 hours. BNP: Invalid input(s): "POCBNP" CBG: Recent Labs  Lab 12/22/21 0740 12/22/21 1158 12/22/21 1708 12/22/21 2153 12/23/21 0913  GLUCAP 103* 148* 129* 113* 76   D-Dimer No results for input(s): "DDIMER" in the last 72 hours. Hgb A1c No results for input(s): "HGBA1C" in the last 72 hours. Lipid Profile No results for input(s): "CHOL", "HDL", "LDLCALC", "TRIG", "CHOLHDL", "LDLDIRECT" in the last 72 hours. Thyroid function studies No results for input(s): "TSH", "T4TOTAL", "T3FREE", "THYROIDAB" in the last 72 hours.  Invalid input(s): "FREET3" Anemia work up No results for input(s): "VITAMINB12", "FOLATE", "FERRITIN", "TIBC", "IRON", "RETICCTPCT" in the last 72 hours. Urinalysis    Component Value Date/Time   COLORURINE YELLOW (A) 12/12/2021 0158   APPEARANCEUR HAZY (A) 12/12/2021 0158   LABSPEC 1.013 12/12/2021 0158   PHURINE 5.0 12/12/2021 0158   GLUCOSEU NEGATIVE 12/12/2021 0158   HGBUR SMALL (A) 12/12/2021 0158   BILIRUBINUR NEGATIVE 12/12/2021 0158   KETONESUR NEGATIVE 12/12/2021 0158   PROTEINUR >=300 (A) 12/12/2021 0158   NITRITE NEGATIVE 12/12/2021 0158   LEUKOCYTESUR NEGATIVE 12/12/2021  0158   Sepsis Labs Recent Labs  Lab 12/19/21 0441 12/21/21 0541 12/22/21 0506 12/23/21 0341  WBC 24.0* 7.8 7.1 6.0   Microbiology No results found for this or any previous visit (from the past 240 hour(s)).   Total time spend on discharging this patient, including the last patient exam, discussing the hospital stay, instructions for ongoing care as it relates to all pertinent caregivers, as well as preparing the medical discharge records, prescriptions, and/or referrals as applicable, is 30 minutes.    Enzo Bi, MD  [facility] 12/23/2021, 10:07 AM

## 2021-12-23 NOTE — TOC Progression Note (Addendum)
Transition of Care [facility]) - Progression Note    Patient Details  Name: Arthur [name] MRN: [id number] Date of Birth: [birth year]  Transition of Care [facility]) CM/SW [place], [place] Phone Number: 12/23/2021, 10:26 AM  Clinical Narrative:     TOC has asked the [facility] staff for an update on authorization. Plan auth ID is still pending.  TOC spoke to [place] and the auth ID provided is enough. TOC updated SNF. [facility] is approved.    TOC asked SNF about ordering a CPAP. Patient states he does not want a CPAP.Marland Kitchen  Expected Discharge Plan and [place] arrangements for the past 2 months: [place] Expected Discharge Date: 12/23/21                                     Social Determinants of Health (SDOH) Interventions SDOH Screenings   Food Insecurity: No Food Insecurity (12/13/2021)  Housing: Low Risk  (12/13/2021)  Transportation Needs: No Transportation Needs (12/13/2021)  Utilities: Not At Risk (12/13/2021)  Depression (PHQ2-9): Low Risk  (07/19/2018)  Financial Resource Strain: Low Risk  (07/12/2018)  Physical Activity: Unknown (06/27/2018)  Stress: No Stress Concern Present (06/27/2018)  Tobacco Use: High Risk (12/13/2021)    Readmission Risk Interventions     No data to display

## 2021-12-23 NOTE — Plan of Care (Signed)
Problem: Activity: Goal: Ability to tolerate increased activity will improve 12/23/2021 1327 by Mancel Bale, RN Outcome: Adequate for Discharge 12/23/2021 0930 by Mancel Bale, RN Outcome: Progressing   Problem: Clinical Measurements: Goal: Ability to maintain a body temperature in the normal range will improve 12/23/2021 1327 by Mancel Bale, RN Outcome: Adequate for Discharge 12/23/2021 0930 by Mancel Bale, RN Outcome: Progressing   Problem: Respiratory: Goal: Ability to maintain adequate ventilation will improve 12/23/2021 1327 by Mancel Bale, RN Outcome: Adequate for Discharge 12/23/2021 0930 by Mancel Bale, RN Outcome: Progressing Goal: Ability to maintain a clear airway will improve 12/23/2021 1327 by Mancel Bale, RN Outcome: Adequate for Discharge 12/23/2021 0930 by Mancel Bale, RN Outcome: Progressing   Problem: Education: Goal: Knowledge of risk factors and measures for prevention of condition will improve 12/23/2021 1327 by Mancel Bale, RN Outcome: Adequate for Discharge 12/23/2021 0930 by Mancel Bale, RN Outcome: Progressing   Problem: Coping: Goal: Psychosocial and spiritual needs will be supported 12/23/2021 1327 by Mancel Bale, RN Outcome: Adequate for Discharge 12/23/2021 0930 by Mancel Bale, RN Outcome: Progressing   Problem: Respiratory: Goal: Will maintain a patent airway 12/23/2021 1327 by Mancel Bale, RN Outcome: Adequate for Discharge 12/23/2021 0930 by Mancel Bale, RN Outcome: Progressing Goal: Complications related to the disease process, condition or treatment will be avoided or minimized 12/23/2021 1327 by Mancel Bale, RN Outcome: Adequate for Discharge 12/23/2021 0930 by Mancel Bale, RN Outcome: Progressing   Problem: Education: Goal: Ability to describe self-care measures that may prevent or decrease complications (Diabetes Survival Skills Education) will  improve 12/23/2021 1327 by Mancel Bale, RN Outcome: Adequate for Discharge 12/23/2021 0930 by Mancel Bale, RN Outcome: Progressing Goal: Individualized Educational Video(s) 12/23/2021 1327 by Mancel Bale, RN Outcome: Adequate for Discharge 12/23/2021 0930 by Mancel Bale, RN Outcome: Progressing   Problem: Coping: Goal: Ability to adjust to condition or change in health will improve 12/23/2021 1327 by Mancel Bale, RN Outcome: Adequate for Discharge 12/23/2021 0930 by Mancel Bale, RN Outcome: Progressing   Problem: Fluid Volume: Goal: Ability to maintain a balanced intake and output will improve 12/23/2021 1327 by Mancel Bale, RN Outcome: Adequate for Discharge 12/23/2021 0930 by Mancel Bale, RN Outcome: Progressing   Problem: Health Behavior/Discharge Planning: Goal: Ability to identify and utilize available resources and services will improve 12/23/2021 1327 by Mancel Bale, RN Outcome: Adequate for Discharge 12/23/2021 0930 by Mancel Bale, RN Outcome: Progressing Goal: Ability to manage health-related needs will improve 12/23/2021 1327 by Mancel Bale, RN Outcome: Adequate for Discharge 12/23/2021 0930 by Mancel Bale, RN Outcome: Progressing   Problem: Metabolic: Goal: Ability to maintain appropriate glucose levels will improve 12/23/2021 1327 by Mancel Bale, RN Outcome: Adequate for Discharge 12/23/2021 0930 by Mancel Bale, RN Outcome: Progressing   Problem: Nutritional: Goal: Maintenance of adequate nutrition will improve 12/23/2021 1327 by Mancel Bale, RN Outcome: Adequate for Discharge 12/23/2021 0930 by Mancel Bale, RN Outcome: Progressing Goal: Progress toward achieving an optimal weight will improve 12/23/2021 1327 by Mancel Bale, RN Outcome: Adequate for Discharge 12/23/2021 0930 by Mancel Bale, RN Outcome: Progressing   Problem: Skin Integrity: Goal: Risk for  impaired skin integrity will decrease 12/23/2021 1327 by Mancel Bale, RN Outcome: Adequate for Discharge 12/23/2021 0930 by Mancel Bale, RN Outcome: Progressing   Problem: Tissue Perfusion:  Goal: Adequacy of tissue perfusion will improve 12/23/2021 1327 by Mancel Bale, RN Outcome: Adequate for Discharge 12/23/2021 0930 by Mancel Bale, RN Outcome: Progressing   Problem: Education: Goal: Knowledge of General Education information will improve Description: Including pain rating scale, medication(s)/side effects and non-pharmacologic comfort measures 12/23/2021 1327 by Mancel Bale, RN Outcome: Adequate for Discharge 12/23/2021 0930 by Mancel Bale, RN Outcome: Progressing   Problem: Health Behavior/Discharge Planning: Goal: Ability to manage health-related needs will improve 12/23/2021 1327 by Mancel Bale, RN Outcome: Adequate for Discharge 12/23/2021 0930 by Mancel Bale, RN Outcome: Progressing   Problem: Clinical Measurements: Goal: Ability to maintain clinical measurements within normal limits will improve 12/23/2021 1327 by Mancel Bale, RN Outcome: Adequate for Discharge 12/23/2021 0930 by Mancel Bale, RN Outcome: Progressing Goal: Will remain free from infection 12/23/2021 1327 by Mancel Bale, RN Outcome: Adequate for Discharge 12/23/2021 0930 by Mancel Bale, RN Outcome: Progressing Goal: Diagnostic test results will improve 12/23/2021 1327 by Mancel Bale, RN Outcome: Adequate for Discharge 12/23/2021 0930 by Mancel Bale, RN Outcome: Progressing Goal: Respiratory complications will improve 12/23/2021 1327 by Mancel Bale, RN Outcome: Adequate for Discharge 12/23/2021 0930 by Mancel Bale, RN Outcome: Progressing Goal: Cardiovascular complication will be avoided 12/23/2021 1327 by Mancel Bale, RN Outcome: Adequate for Discharge 12/23/2021 0930 by Mancel Bale, RN Outcome:  Progressing   Problem: Activity: Goal: Risk for activity intolerance will decrease 12/23/2021 1327 by Mancel Bale, RN Outcome: Adequate for Discharge 12/23/2021 0930 by Mancel Bale, RN Outcome: Progressing   Problem: Nutrition: Goal: Adequate nutrition will be maintained 12/23/2021 1327 by Mancel Bale, RN Outcome: Adequate for Discharge 12/23/2021 0930 by Mancel Bale, RN Outcome: Progressing   Problem: Coping: Goal: Level of anxiety will decrease 12/23/2021 1327 by Mancel Bale, RN Outcome: Adequate for Discharge 12/23/2021 0930 by Mancel Bale, RN Outcome: Progressing   Problem: Elimination: Goal: Will not experience complications related to bowel motility 12/23/2021 1327 by Mancel Bale, RN Outcome: Adequate for Discharge 12/23/2021 0930 by Mancel Bale, RN Outcome: Progressing Goal: Will not experience complications related to urinary retention 12/23/2021 1327 by Mancel Bale, RN Outcome: Adequate for Discharge 12/23/2021 0930 by Mancel Bale, RN Outcome: Progressing   Problem: Pain Managment: Goal: General experience of comfort will improve 12/23/2021 1327 by Mancel Bale, RN Outcome: Adequate for Discharge 12/23/2021 0930 by Mancel Bale, RN Outcome: Progressing   Problem: Safety: Goal: Ability to remain free from injury will improve 12/23/2021 1327 by Mancel Bale, RN Outcome: Adequate for Discharge 12/23/2021 0930 by Mancel Bale, RN Outcome: Progressing   Problem: Skin Integrity: Goal: Risk for impaired skin integrity will decrease 12/23/2021 1327 by Mancel Bale, RN Outcome: Adequate for Discharge 12/23/2021 0930 by Mancel Bale, RN Outcome: Progressing

## 2021-12-23 NOTE — Progress Notes (Signed)
Palliative: Chart review completed.  Arthur [name] is to discharge to [facility] for short-term rehab.  [facility] to follow-up for outpatient palliative services.  DNR/goldenrod form is on chart.  No charge Quinn Axe, NP Palliative medicine team Team phone [phone] Greater than 50% of this time was spent counseling and coordinating care related to the above assessment and plan.

## 2021-12-23 NOTE — Progress Notes (Signed)
   12/23/21 1104  Medical Necessity for Transport Certificate --- IF THIS TRANSPORT IS ROUND TRIP OR SCHEDULED AND REPEATED, A PHYSICIAN MUST COMPLETE THIS FORM  Transport from: Teacher, English as a foreign language) Doctor, hospital to (Location) Other (Comment) ([place] [facility])  Did the patient arrive from a [place], [place] or Group Home? No  Is this the closest appropriate facility? Yes  Date of Transport Service 12/23/21  Name of [facility]  Round Trip Transport? No  Reason for Transport Discharge  Is this a hospice patient? No  Describe the Medical Condition aspiration pneumonia, COVID-19, recurrent right pleural effusion, COPD, frailty, chronic respiratory failure with hypoxia, CKD, anemia in chronic kidney disease, type II diabetes, long term insulin, hypertension, OSA on CPAP, weakness, pleural effusion  Q1 Are ALL the following "true"? 1. Patient unable to get up from bed without assistance  AND  2. Unable to ambulate  AND  3. Unable to sit in a chair, including wheelchair. No  Q2 Could the patient be transported safely by other means of transportation (I.E., wheelchair van)? No  Q3 Please check any of the following conditions that apply at the time of transport: Risk of injury to self and/or others  Credentials DP  Date Signed 12/23/21

## 2021-12-23 NOTE — Progress Notes (Signed)
Occupational Therapy Treatment Patient Details Name: Arthur [name] MRN: [id number] DOB: 1933/08/13 Today's Date: 12/23/2021   History of present illness Pt. is a 88 y.o. male presenting to hospital 12/10 with c/o weakness and SOB; unable to ambulate.  Pt admitted with COVID infection with secondary bacterial infection, aspiration PNA, recurrent R pleural effusion, and frailty.  PMH includes htn, DM, HLD, COPD, chronic respiratory failure on 2 L home O2, OSA on CPAP, h/o multiple kyphoplasties.   OT comments  Upon entering the room, pt supine in bed with breakfast tray. Pt is observed to be able to feed self and open containers without assistance. OT attempting OOB transfer with pt but he refuses. Pt refusing to allow therapist to lower HOB to increase ease for repositioning. Total A to scoot pt up in bed with him giving no effort. OT providing yellow theraband for B UE strengthening exercises and reviewed exercises with him. Pt returning 5 reps of shoulder elevation and bicep curls before dropping band and pulling covers of arms and stating, " I am tired not". Pt declined further intervention. Bed alarm and all needed items within reach upon exiting the room.    Recommendations for follow up therapy are one component of a multi-disciplinary discharge planning process, led by the attending physician.  Recommendations may be updated based on patient status, additional functional criteria and insurance authorization.    Follow Up Recommendations  Skilled nursing-short term rehab (<3 hours/day)     Assistance Recommended at Discharge Frequent or constant Supervision/Assistance  Patient can return home with the following  Assistance with cooking/housework;Assist for transportation;Help with stairs or ramp for entrance;Two people to help with bathing/dressing/bathroom;Two people to help with walking and/or transfers   Equipment Recommendations  Other (comment) (defer to next venue of care)        Precautions / Restrictions Precautions Precautions: Fall       Mobility Bed Mobility Overal bed mobility: Needs Assistance Bed Mobility: Supine to Sit, Sit to Supine           General bed mobility comments: total A to reposition    Transfers                   General transfer comment: pt refusal         ADL either performed or assessed with clinical judgement   ADL   Eating/Feeding: Set up                                          Extremity/Trunk Assessment Upper Extremity Assessment Upper Extremity Assessment: Generalized weakness   Lower Extremity Assessment Lower Extremity Assessment: Generalized weakness        Vision Patient Visual Report: No change from baseline            Cognition Arousal/Alertness: Awake/alert Behavior During Therapy: WFL for tasks assessed/performed Overall Cognitive Status: Within Functional Limits for tasks assessed                                 General Comments: oriented to self and location only                   Pertinent Vitals/ Pain       Pain Assessment Pain Assessment: 0-10 Pain Score: 6  Pain Location: back Pain Descriptors / Indicators: Sore,  Tender, Guarding, Constant Pain Intervention(s): Limited activity within patient's tolerance, Monitored during session, Repositioned         Frequency  Min 2X/week        Progress Toward Goals  OT Goals(current goals can now be found in the care plan section)  Progress towards OT goals: Progressing toward goals  Acute Rehab OT Goals Patient Stated Goal: to decrease pain OT Goal Formulation: With patient Time For Goal Achievement: 12/26/21 Potential to Achieve Goals: [place] Discharge plan remains appropriate;Frequency remains appropriate       AM-PAC OT "6 Clicks" Daily Activity     Outcome Measure   Help from another person eating meals?: A Little Help from another person taking care of personal  grooming?: A Little Help from another person toileting, which includes using toliet, bedpan, or urinal?: Total Help from another person bathing (including washing, rinsing, drying)?: Total Help from another person to put on and taking off regular upper body clothing?: A Lot Help from another person to put on and taking off regular lower body clothing?: Total 6 Click Score: 11    End of Session Equipment Utilized During Treatment: Oxygen  OT Visit Diagnosis: Other abnormalities of gait and mobility (R26.89);Muscle weakness (generalized) (M62.81)   Activity Tolerance Other (comment) (pt is self limiting)   Patient Left in bed;with call bell/phone within reach;with bed alarm set   Nurse Communication Mobility status        Time: 7026-3785 OT Time Calculation (min): 17 min  Charges: OT General Charges $OT Visit: 1 Visit OT Treatments $Therapeutic Activity: 8-22 mins  Darleen Crocker, MS, OTR/L , CBIS ascom [phone]  12/23/21, 9:58 AM

## 2021-12-23 NOTE — Progress Notes (Signed)
Pt discharging to [facility]. Report called to [place]. Patient belongings packed up. Discharge packet given to EMS transporters.

## 2021-12-23 NOTE — Care Management Important Message (Signed)
Important Message  Patient Details  Name: Arthur [name] MRN: [id number] Date of Birth: 1933-11-24   Medicare Important Message Given:  Yes  Patient is in an isolation room so I reviewed the Important Message from Medicare with him by phone [phone]). I wished him a speedy recovery and thanked him for his time.      [name] 12/23/2021, 9:11 AM

## 2021-12-25 ENCOUNTER — Emergency Department: Payer: Medicare HMO

## 2021-12-25 ENCOUNTER — Other Ambulatory Visit: Payer: Self-pay

## 2021-12-25 ENCOUNTER — Inpatient Hospital Stay: Payer: Medicare HMO

## 2021-12-25 ENCOUNTER — Inpatient Hospital Stay
Admission: EM | Admit: 2021-12-25 | Discharge: 2022-01-02 | DRG: 871 | Disposition: E | Payer: Medicare HMO | Source: Skilled Nursing Facility | Attending: Internal Medicine | Admitting: Internal Medicine

## 2021-12-25 DIAGNOSIS — E785 Hyperlipidemia, unspecified: Secondary | ICD-10-CM | POA: Diagnosis present

## 2021-12-25 DIAGNOSIS — I2489 Other forms of acute ischemic heart disease: Secondary | ICD-10-CM | POA: Diagnosis not present

## 2021-12-25 DIAGNOSIS — Z66 Do not resuscitate: Secondary | ICD-10-CM | POA: Diagnosis not present

## 2021-12-25 DIAGNOSIS — M47816 Spondylosis without myelopathy or radiculopathy, lumbar region: Secondary | ICD-10-CM | POA: Diagnosis present

## 2021-12-25 DIAGNOSIS — Z885 Allergy status to narcotic agent status: Secondary | ICD-10-CM

## 2021-12-25 DIAGNOSIS — R7989 Other specified abnormal findings of blood chemistry: Secondary | ICD-10-CM | POA: Diagnosis present

## 2021-12-25 DIAGNOSIS — R627 Adult failure to thrive: Secondary | ICD-10-CM | POA: Diagnosis present

## 2021-12-25 DIAGNOSIS — E119 Type 2 diabetes mellitus without complications: Secondary | ICD-10-CM

## 2021-12-25 DIAGNOSIS — Z809 Family history of malignant neoplasm, unspecified: Secondary | ICD-10-CM

## 2021-12-25 DIAGNOSIS — J432 Centrilobular emphysema: Secondary | ICD-10-CM | POA: Diagnosis present

## 2021-12-25 DIAGNOSIS — A419 Sepsis, unspecified organism: Secondary | ICD-10-CM | POA: Diagnosis not present

## 2021-12-25 DIAGNOSIS — I272 Pulmonary hypertension, unspecified: Secondary | ICD-10-CM | POA: Diagnosis present

## 2021-12-25 DIAGNOSIS — Z87891 Personal history of nicotine dependence: Secondary | ICD-10-CM

## 2021-12-25 DIAGNOSIS — R6520 Severe sepsis without septic shock: Secondary | ICD-10-CM | POA: Diagnosis present

## 2021-12-25 DIAGNOSIS — N189 Chronic kidney disease, unspecified: Secondary | ICD-10-CM | POA: Diagnosis present

## 2021-12-25 DIAGNOSIS — I5032 Chronic diastolic (congestive) heart failure: Secondary | ICD-10-CM | POA: Diagnosis present

## 2021-12-25 DIAGNOSIS — Z85828 Personal history of other malignant neoplasm of skin: Secondary | ICD-10-CM

## 2021-12-25 DIAGNOSIS — Z8616 Personal history of COVID-19: Secondary | ICD-10-CM | POA: Diagnosis not present

## 2021-12-25 DIAGNOSIS — N179 Acute kidney failure, unspecified: Secondary | ICD-10-CM | POA: Diagnosis present

## 2021-12-25 DIAGNOSIS — M549 Dorsalgia, unspecified: Secondary | ICD-10-CM | POA: Diagnosis present

## 2021-12-25 DIAGNOSIS — E43 Unspecified severe protein-calorie malnutrition: Secondary | ICD-10-CM | POA: Diagnosis present

## 2021-12-25 DIAGNOSIS — I130 Hypertensive heart and chronic kidney disease with heart failure and stage 1 through stage 4 chronic kidney disease, or unspecified chronic kidney disease: Secondary | ICD-10-CM | POA: Diagnosis present

## 2021-12-25 DIAGNOSIS — I10 Essential (primary) hypertension: Secondary | ICD-10-CM | POA: Diagnosis present

## 2021-12-25 DIAGNOSIS — J690 Pneumonitis due to inhalation of food and vomit: Secondary | ICD-10-CM | POA: Diagnosis not present

## 2021-12-25 DIAGNOSIS — J9601 Acute respiratory failure with hypoxia: Secondary | ICD-10-CM | POA: Diagnosis present

## 2021-12-25 DIAGNOSIS — D631 Anemia in chronic kidney disease: Secondary | ICD-10-CM | POA: Diagnosis not present

## 2021-12-25 DIAGNOSIS — Z6826 Body mass index (BMI) 26.0-26.9, adult: Secondary | ICD-10-CM

## 2021-12-25 DIAGNOSIS — R739 Hyperglycemia, unspecified: Secondary | ICD-10-CM | POA: Diagnosis not present

## 2021-12-25 DIAGNOSIS — E860 Dehydration: Secondary | ICD-10-CM | POA: Diagnosis present

## 2021-12-25 DIAGNOSIS — R1312 Dysphagia, oropharyngeal phase: Secondary | ICD-10-CM | POA: Diagnosis present

## 2021-12-25 DIAGNOSIS — D72829 Elevated white blood cell count, unspecified: Secondary | ICD-10-CM | POA: Diagnosis present

## 2021-12-25 DIAGNOSIS — R918 Other nonspecific abnormal finding of lung field: Secondary | ICD-10-CM | POA: Diagnosis not present

## 2021-12-25 DIAGNOSIS — Z794 Long term (current) use of insulin: Secondary | ICD-10-CM | POA: Diagnosis not present

## 2021-12-25 DIAGNOSIS — R0602 Shortness of breath: Secondary | ICD-10-CM | POA: Diagnosis not present

## 2021-12-25 DIAGNOSIS — J449 Chronic obstructive pulmonary disease, unspecified: Secondary | ICD-10-CM | POA: Diagnosis present

## 2021-12-25 DIAGNOSIS — N184 Chronic kidney disease, stage 4 (severe): Secondary | ICD-10-CM | POA: Diagnosis present

## 2021-12-25 DIAGNOSIS — J441 Chronic obstructive pulmonary disease with (acute) exacerbation: Secondary | ICD-10-CM | POA: Diagnosis present

## 2021-12-25 DIAGNOSIS — U071 COVID-19: Secondary | ICD-10-CM | POA: Diagnosis not present

## 2021-12-25 DIAGNOSIS — E870 Hyperosmolality and hypernatremia: Secondary | ICD-10-CM | POA: Diagnosis not present

## 2021-12-25 DIAGNOSIS — R531 Weakness: Secondary | ICD-10-CM

## 2021-12-25 DIAGNOSIS — N400 Enlarged prostate without lower urinary tract symptoms: Secondary | ICD-10-CM | POA: Diagnosis present

## 2021-12-25 DIAGNOSIS — E1122 Type 2 diabetes mellitus with diabetic chronic kidney disease: Secondary | ICD-10-CM | POA: Diagnosis present

## 2021-12-25 DIAGNOSIS — J439 Emphysema, unspecified: Secondary | ICD-10-CM | POA: Diagnosis not present

## 2021-12-25 DIAGNOSIS — J9621 Acute and chronic respiratory failure with hypoxia: Secondary | ICD-10-CM | POA: Diagnosis present

## 2021-12-25 DIAGNOSIS — J90 Pleural effusion, not elsewhere classified: Secondary | ICD-10-CM | POA: Diagnosis not present

## 2021-12-25 DIAGNOSIS — E875 Hyperkalemia: Secondary | ICD-10-CM | POA: Diagnosis present

## 2021-12-25 DIAGNOSIS — J1282 Pneumonia due to coronavirus disease 2019: Secondary | ICD-10-CM | POA: Diagnosis not present

## 2021-12-25 DIAGNOSIS — Z79899 Other long term (current) drug therapy: Secondary | ICD-10-CM

## 2021-12-25 DIAGNOSIS — N2581 Secondary hyperparathyroidism of renal origin: Secondary | ICD-10-CM | POA: Diagnosis present

## 2021-12-25 DIAGNOSIS — Z515 Encounter for palliative care: Secondary | ICD-10-CM | POA: Diagnosis not present

## 2021-12-25 DIAGNOSIS — Z9981 Dependence on supplemental oxygen: Secondary | ICD-10-CM

## 2021-12-25 DIAGNOSIS — I959 Hypotension, unspecified: Secondary | ICD-10-CM | POA: Diagnosis not present

## 2021-12-25 DIAGNOSIS — J189 Pneumonia, unspecified organism: Secondary | ICD-10-CM | POA: Diagnosis present

## 2021-12-25 DIAGNOSIS — G8929 Other chronic pain: Secondary | ICD-10-CM | POA: Diagnosis present

## 2021-12-25 DIAGNOSIS — I2510 Atherosclerotic heart disease of native coronary artery without angina pectoris: Secondary | ICD-10-CM | POA: Diagnosis present

## 2021-12-25 DIAGNOSIS — G4733 Obstructive sleep apnea (adult) (pediatric): Secondary | ICD-10-CM | POA: Diagnosis present

## 2021-12-25 DIAGNOSIS — Z825 Family history of asthma and other chronic lower respiratory diseases: Secondary | ICD-10-CM

## 2021-12-25 LAB — BASIC METABOLIC PANEL
Anion gap: 6 (ref 5–15)
BUN: 85 mg/dL — ABNORMAL HIGH (ref 8–23)
CO2: 27 mmol/L (ref 22–32)
Calcium: 7.2 mg/dL — ABNORMAL LOW (ref 8.9–10.3)
Chloride: 105 mmol/L (ref 98–111)
Creatinine, Ser: 2.9 mg/dL — ABNORMAL HIGH (ref 0.61–1.24)
GFR, Estimated: 20 mL/min — ABNORMAL LOW (ref >60)
Glucose, Bld: 178 mg/dL — ABNORMAL HIGH (ref 70–99)
Potassium: 5.9 mmol/L — ABNORMAL HIGH (ref 3.5–5.1)
Sodium: 138 mmol/L (ref 135–145)

## 2021-12-25 LAB — CBC WITH DIFFERENTIAL/PLATELET
Abs Immature Granulocytes: 0.5 10*3/uL — ABNORMAL HIGH (ref 0.00–0.07)
Basophils Absolute: 0.1 10*3/uL (ref 0.0–0.1)
Basophils Relative: 0 %
Eosinophils Absolute: 0 10*3/uL (ref 0.0–0.5)
Eosinophils Relative: 0 %
HCT: 28.7 % — ABNORMAL LOW (ref 39.0–52.0)
Hemoglobin: 8.4 g/dL — ABNORMAL LOW (ref 13.0–17.0)
Immature Granulocytes: 1 %
Lymphocytes Relative: 1 %
Lymphs Abs: 0.5 10*3/uL — ABNORMAL LOW (ref 0.7–4.0)
MCH: 28.1 pg (ref 26.0–34.0)
MCHC: 29.3 g/dL — ABNORMAL LOW (ref 30.0–36.0)
MCV: 96 fL (ref 80.0–100.0)
Monocytes Absolute: 2.3 10*3/uL — ABNORMAL HIGH (ref 0.1–1.0)
Monocytes Relative: 6 %
Neutro Abs: 35.8 10*3/uL — ABNORMAL HIGH (ref 1.7–7.7)
Neutrophils Relative %: 92 %
Platelets: 244 10*3/uL (ref 150–400)
RBC: 2.99 MIL/uL — ABNORMAL LOW (ref 4.22–5.81)
RDW: 18.9 % — ABNORMAL HIGH (ref 11.5–15.5)
Smear Review: NORMAL
WBC: 39.2 10*3/uL — ABNORMAL HIGH (ref 4.0–10.5)
nRBC: 0.2 % (ref 0.0–0.2)

## 2021-12-25 LAB — CBC
HCT: 31.4 % — ABNORMAL LOW (ref 39.0–52.0)
Hemoglobin: 9.1 g/dL — ABNORMAL LOW (ref 13.0–17.0)
MCH: 27.7 pg (ref 26.0–34.0)
MCHC: 29 g/dL — ABNORMAL LOW (ref 30.0–36.0)
MCV: 95.4 fL (ref 80.0–100.0)
Platelets: 260 10*3/uL (ref 150–400)
RBC: 3.29 MIL/uL — ABNORMAL LOW (ref 4.22–5.81)
RDW: 19.1 % — ABNORMAL HIGH (ref 11.5–15.5)
WBC: 42.1 10*3/uL — ABNORMAL HIGH (ref 4.0–10.5)
nRBC: 0.2 % (ref 0.0–0.2)

## 2021-12-25 LAB — RESP PANEL BY RT-PCR (RSV, FLU A&B, COVID)  RVPGX2
Influenza A by PCR: NEGATIVE
Influenza B by PCR: NEGATIVE
Resp Syncytial Virus by PCR: NEGATIVE
SARS Coronavirus 2 by RT PCR: POSITIVE — AB

## 2021-12-25 LAB — COMPREHENSIVE METABOLIC PANEL
ALT: 14 U/L (ref 0–44)
AST: 16 U/L (ref 15–41)
Albumin: 2.9 g/dL — ABNORMAL LOW (ref 3.5–5.0)
Alkaline Phosphatase: 71 U/L (ref 38–126)
Anion gap: 8 (ref 5–15)
BUN: 81 mg/dL — ABNORMAL HIGH (ref 8–23)
CO2: 27 mmol/L (ref 22–32)
Calcium: 7.6 mg/dL — ABNORMAL LOW (ref 8.9–10.3)
Chloride: 102 mmol/L (ref 98–111)
Creatinine, Ser: 2.8 mg/dL — ABNORMAL HIGH (ref 0.61–1.24)
GFR, Estimated: 21 mL/min — ABNORMAL LOW (ref >60)
Glucose, Bld: 183 mg/dL — ABNORMAL HIGH (ref 70–99)
Potassium: 5.9 mmol/L — ABNORMAL HIGH (ref 3.5–5.1)
Sodium: 137 mmol/L (ref 135–145)
Total Bilirubin: 1.6 mg/dL — ABNORMAL HIGH (ref 0.3–1.2)
Total Protein: 6.1 g/dL — ABNORMAL LOW (ref 6.5–8.1)

## 2021-12-25 LAB — URINALYSIS, COMPLETE (UACMP) WITH MICROSCOPIC
Bacteria, UA: NONE SEEN
Bilirubin Urine: NEGATIVE
Glucose, UA: NEGATIVE mg/dL
Hgb urine dipstick: NEGATIVE
Ketones, ur: NEGATIVE mg/dL
Leukocytes,Ua: NEGATIVE
Nitrite: NEGATIVE
Protein, ur: 100 mg/dL — AB
Specific Gravity, Urine: 1.014 (ref 1.005–1.030)
Squamous Epithelial / HPF: NONE SEEN (ref 0–5)
pH: 5 (ref 5.0–8.0)

## 2021-12-25 LAB — LACTIC ACID, PLASMA
Lactic Acid, Venous: 1.2 mmol/L (ref 0.5–1.9)
Lactic Acid, Venous: 1.3 mmol/L (ref 0.5–1.9)

## 2021-12-25 LAB — CREATININE, SERUM
Creatinine, Ser: 2.88 mg/dL — ABNORMAL HIGH (ref 0.61–1.24)
GFR, Estimated: 21 mL/min — ABNORMAL LOW (ref >60)

## 2021-12-25 LAB — TROPONIN I (HIGH SENSITIVITY): Troponin I (High Sensitivity): 47 ng/L — ABNORMAL HIGH (ref <18)

## 2021-12-25 MED ORDER — VANCOMYCIN HCL IN DEXTROSE 1-5 GM/200ML-% IV SOLN: 1000.0000 mg | Freq: Once | INTRAVENOUS | Status: DC

## 2021-12-25 MED ORDER — VANCOMYCIN HCL 500 MG/100ML IV SOLN
500.0000 mg | Freq: Once | INTRAVENOUS | Status: AC
  Administered 2021-12-25: 500 mg via INTRAVENOUS
  Filled 2021-12-25: qty 100

## 2021-12-25 MED ORDER — HYDRALAZINE HCL 20 MG/ML IJ SOLN: 5.0000 mg | Freq: Four times a day (QID) | INTRAMUSCULAR | Status: DC | PRN

## 2021-12-25 MED ORDER — HEPARIN SODIUM (PORCINE) 5000 UNIT/ML IJ SOLN
5000.0000 [IU] | Freq: Three times a day (TID) | INTRAMUSCULAR | Status: DC
  Administered 2021-12-25 – 2021-12-29 (×11): 5000 [IU] via SUBCUTANEOUS
  Filled 2021-12-25 (×11): qty 1

## 2021-12-25 MED ORDER — ACETAMINOPHEN 650 MG RE SUPP: 650.0000 mg | Freq: Four times a day (QID) | RECTAL | Status: DC | PRN

## 2021-12-25 MED ORDER — SODIUM POLYSTYRENE SULFONATE 15 GM/60ML PO SUSP
30.0000 g | Freq: Once | ORAL | Status: AC
  Administered 2021-12-25: 30 g via ORAL
  Filled 2021-12-25: qty 120

## 2021-12-25 MED ORDER — SODIUM CHLORIDE 0.9 % IV SOLN
500.0000 mg | INTRAVENOUS | Status: DC
  Administered 2021-12-25 – 2021-12-28 (×4): 500 mg via INTRAVENOUS
  Filled 2021-12-25 (×5): qty 5

## 2021-12-25 MED ORDER — AMLODIPINE BESYLATE 10 MG PO TABS
10.0000 mg | ORAL_TABLET | Freq: Every day | ORAL | Status: DC
  Filled 2021-12-25: qty 2
  Filled 2021-12-25: qty 1

## 2021-12-25 MED ORDER — VANCOMYCIN HCL 1500 MG/300ML IV SOLN
1500.0000 mg | Freq: Once | INTRAVENOUS | Status: DC
  Filled 2021-12-25: qty 300

## 2021-12-25 MED ORDER — ONDANSETRON HCL 4 MG/2ML IJ SOLN
4.0000 mg | Freq: Four times a day (QID) | INTRAMUSCULAR | Status: DC | PRN
  Administered 2021-12-25 – 2021-12-28 (×2): 4 mg via INTRAVENOUS
  Filled 2021-12-25: qty 2

## 2021-12-25 MED ORDER — TIOTROPIUM BROMIDE MONOHYDRATE 18 MCG IN CAPS
18.0000 ug | ORAL_CAPSULE | Freq: Every day | RESPIRATORY_TRACT | Status: DC
  Administered 2021-12-26 – 2021-12-27 (×2): 18 ug via RESPIRATORY_TRACT
  Filled 2021-12-25 (×3): qty 5

## 2021-12-25 MED ORDER — CALCIUM GLUCONATE 10 % IV SOLN
1.0000 g | Freq: Once | INTRAVENOUS | Status: AC
  Administered 2021-12-25: 1 g via INTRAVENOUS
  Filled 2021-12-25: qty 10

## 2021-12-25 MED ORDER — TORSEMIDE 20 MG PO TABS
20.0000 mg | ORAL_TABLET | ORAL | Status: DC
  Administered 2021-12-26: 20 mg via ORAL
  Filled 2021-12-25: qty 1

## 2021-12-25 MED ORDER — SODIUM BICARBONATE 8.4 % IV SOLN
50.0000 meq | Freq: Once | INTRAVENOUS | Status: AC
  Administered 2021-12-25: 50 meq via INTRAVENOUS
  Filled 2021-12-25: qty 50

## 2021-12-25 MED ORDER — DEXTROSE 50 % IV SOLN
1.0000 | Freq: Once | INTRAVENOUS | Status: AC
  Administered 2021-12-25: 50 mL via INTRAVENOUS
  Filled 2021-12-25: qty 50

## 2021-12-25 MED ORDER — PRAVASTATIN SODIUM 20 MG PO TABS
10.0000 mg | ORAL_TABLET | Freq: Every day | ORAL | Status: DC
  Administered 2021-12-25 – 2021-12-26 (×2): 10 mg via ORAL
  Filled 2021-12-25 (×3): qty 1

## 2021-12-25 MED ORDER — SODIUM CHLORIDE 0.9 % IV SOLN
2.0000 g | INTRAVENOUS | Status: DC
  Administered 2021-12-26 – 2021-12-28 (×3): 2 g via INTRAVENOUS
  Filled 2021-12-25 (×4): qty 12.5

## 2021-12-25 MED ORDER — VANCOMYCIN HCL IN DEXTROSE 1-5 GM/200ML-% IV SOLN: 1000.0000 mg | INTRAVENOUS | Status: DC

## 2021-12-25 MED ORDER — SODIUM CHLORIDE 0.9 % IV BOLUS
500.0000 mL | Freq: Once | INTRAVENOUS | Status: AC
  Administered 2021-12-25: 500 mL via INTRAVENOUS

## 2021-12-25 MED ORDER — ONDANSETRON HCL 4 MG/2ML IJ SOLN
4.0000 mg | Freq: Once | INTRAMUSCULAR | Status: DC
  Filled 2021-12-25: qty 2

## 2021-12-25 MED ORDER — INSULIN ASPART 100 UNIT/ML IJ SOLN
7.0000 [IU] | Freq: Once | INTRAMUSCULAR | Status: AC
  Administered 2021-12-26: 7 [IU] via SUBCUTANEOUS
  Filled 2021-12-25: qty 1

## 2021-12-25 MED ORDER — SODIUM CHLORIDE 0.9 % IV SOLN
2.0000 g | Freq: Once | INTRAVENOUS | Status: AC
  Administered 2021-12-25: 2 g via INTRAVENOUS
  Filled 2021-12-25: qty 12.5

## 2021-12-25 MED ORDER — TAMSULOSIN HCL 0.4 MG PO CAPS
0.4000 mg | ORAL_CAPSULE | Freq: Every day | ORAL | Status: DC
  Administered 2021-12-26: 0.4 mg via ORAL
  Filled 2021-12-25 (×2): qty 1

## 2021-12-25 MED ORDER — ACETAMINOPHEN 325 MG PO TABS: 650.0000 mg | ORAL_TABLET | Freq: Four times a day (QID) | ORAL | Status: DC | PRN

## 2021-12-25 MED ORDER — VANCOMYCIN HCL IN DEXTROSE 1-5 GM/200ML-% IV SOLN
1000.0000 mg | Freq: Once | INTRAVENOUS | Status: AC
  Administered 2021-12-25: 1000 mg via INTRAVENOUS
  Filled 2021-12-25: qty 200

## 2021-12-25 NOTE — H&P (Addendum)
History and Physical    Patient: Arthur [name]:[id number] DOB: 10-03-1933 DOA: 12/14/2021 DOS: the patient was seen and examined on 12/28/2021 PCP: Baxter Hire, MD  Patient coming from: SNF  Chief Complaint:  Chief Complaint  Patient presents with   Shortness of Breath   HPI: [name] is a 88 y.o. male with medical history significant of hypertension, hyperlipidemia, Dm2, CHF (diastolic), Copd, OSA on CPAP, R pleural effusion, w recemt admission 12/12/21 for aspiration pneumonia, Covid -19 positive (12/12/21), apparently presents with increase o2 requirement, hypoxia while at SNF, pt notes n/v, + constipation. .  pt denies fever, chills, cp, palp, sob beyond baseline, abd pain, diarrhea, brbpr, black stool, dysuria, hematuria.    In ED, T 98 P 55  Bp 157/48  r 16  pox  92% on RA  Wbc 42.1, Hgb 9.1, Plt 260 Na 137, K 5.9, Bun 81, Creat 2.8  (baseline 2) Glucose 183 Alb 2.9 Ast 16, Alt 14  Covid -19 ++++ Lactic acid 1.2 Trop 47->   CT chest  IMPRESSION: 1. Complex loculated right pleural effusion is grossly similar to chest CT and ultrasound 12/12/2021. Underlying malignancy is difficult to exclude without IV contrast. 2. Extensive airway impaction and complete collapse of the right lower lobe. Improved aeration of the right middle lobe compared to prior CT. Pneumonia is difficult to exclude. 3. New left lower lobe bronchopneumonia. 4. Coronary artery calcification. 5. Enlarged central pulmonary arteries in keeping with changes of pulmonary hypertension. 6. Indeterminate mediastinal and hilar nodes are unchanged from prior.   Aortic Atherosclerosis (ICD10-I70.0) and Emphysema (ICD10-J43.9).    Pt will be admitted for acute respiratory failure with hypoxia secondary to Hcap, and right pleural effusion, and leukocytosis wbc 42, and hyperkalemia, and ARF on CRF.     Review of Systems: negative for all 10 organ systems except for + above HPI Past Medical  History:  Diagnosis Date   Anemia of chronic kidney failure 08/21/2018   Arthritis    lower back   CHF (congestive heart failure) (HCC)    CKD (chronic kidney disease), stage III (HCC)    COPD (chronic obstructive pulmonary disease) ([place])    Diabetes mellitus without complication ([place])    type 2   Dyspnea    when active -wears oxygen 2L via [place]   History of blood transfusion    Hyperlipidemia    Hypertension    OSA on CPAP 12/12/2021   Requires continuous at home supplemental oxygen    2L via [place]   Squamous cell carcinoma in situ 09/17/2012   Right upper nasal dorsum. SCCis arising in [place]   Squamous cell carcinoma of skin 02/03/2020   R lat cheek, EDC   Past Surgical History:  Procedure Laterality Date   COLONOSCOPY WITH PROPOFOL N/A 06/11/2018   Procedure: COLONOSCOPY WITH PROPOFOL;  Surgeon: Jonathon Bellows, MD;  Location: [facility] ENDOSCOPY;  Service: Gastroenterology;  Laterality: N/A;   COLONOSCOPY WITH PROPOFOL N/A 06/13/2018   Procedure: COLONOSCOPY WITH PROPOFOL;  Surgeon: Jonathon Bellows, MD;  Location: [facility] ENDOSCOPY;  Service: Gastroenterology;  Laterality: N/A;   ESOPHAGOGASTRODUODENOSCOPY Left 06/10/2018   Procedure: ESOPHAGOGASTRODUODENOSCOPY (EGD);  Surgeon: Jonathon Bellows, MD;  Location: [facility] ENDOSCOPY;  Service: Gastroenterology;  Laterality: Left;   EYE SURGERY     removed cataracts   GIVENS CAPSULE STUDY N/A 06/28/2018   Procedure: GIVENS CAPSULE STUDY;  Surgeon: Lucilla Lame, MD;  Location: [facility] ENDOSCOPY;  Service: Endoscopy;  Laterality: N/A;   GIVENS CAPSULE STUDY N/A  06/30/2018   Procedure: GIVENS CAPSULE STUDY;  Surgeon: Jonathon Bellows, MD;  Location: [facility] ENDOSCOPY;  Service: Gastroenterology;  Laterality: N/A;   KYPHOPLASTY N/A 10/31/2018   Procedure: KYPHOPLASTY L2;  Surgeon: [name], MD;  Location: [facility] ORS;  Service: Orthopedics;  Laterality: N/A;   KYPHOPLASTY N/A 12/24/2018   Procedure: KYPHOPLASTY L3;  Surgeon: [name], MD;  Location: [facility] ORS;  Service:  Orthopedics;  Laterality: N/A;   KYPHOPLASTY N/A 02/18/2019   Procedure: L2 KYPHOPLASTY;  Surgeon: [name], MD;  Location: [facility] ORS;  Service: Orthopedics;  Laterality: N/A;   RADIOLOGY WITH ANESTHESIA N/A 01/28/2019   Procedure: MRI LUMBER SPINE WITHOUT CONTRAST;  Surgeon: Radiologist, Medication, MD;  Location: [facility];  Service: Radiology;  Laterality: N/A;   Social History:  reports that he quit smoking about 39 years ago. His smoking use included cigarettes. He has a 70.00 pack-year smoking history. His smokeless tobacco use includes chew. He reports that he does not currently use alcohol. He reports that he does not use drugs.  Allergies  Allergen Reactions   Codeine Other (See Comments)    Constipation    Family History  Problem Relation Age of Onset   COPD Mother    Cancer Father     Prior to Admission medications   Medication Sig Start Date End Date Taking? Authorizing Provider  acetaminophen (TYLENOL) 325 MG tablet Take 2 tablets (650 mg total) by mouth every 6 (six) hours as needed for mild pain (or Fever >/= 101). 03/07/19   Leslye Peer, Richard, MD  albuterol (VENTOLIN HFA) 108 (90 Base) MCG/ACT inhaler Inhale 2 puffs into the lungs every 6 (six) hours as needed for wheezing or shortness of breath. 05/27/18   Demetrios Loll, MD  amLODipine (NORVASC) 10 MG tablet Take 1 tablet (10 mg total) by mouth daily. 12/23/21   Enzo Bi, MD  insulin aspart (NOVOLOG) 100 UNIT/ML injection 4 units prior to meals if sugars above 200 03/07/19   Wieting, Richard, MD  LEVEMIR 100 UNIT/ML injection Inject 0.04 mLs (4 Units total) into the skin at bedtime. 03/07/19   Loletha Grayer, MD  pravastatin (PRAVACHOL) 10 MG tablet Take 10 mg by mouth at bedtime. 04/25/18   [provider]  promethazine (PHENERGAN) 25 MG tablet Take 25 mg by mouth every 4 (four) hours as needed for nausea or vomiting.    [provider]  tamsulosin (FLOMAX) 0.4 MG CAPS capsule Take 0.4 mg by mouth daily. 04/25/18    [provider]  tiotropium (SPIRIVA) 18 MCG inhalation capsule Place 18 mcg into inhaler and inhale daily.    [provider]  torsemide (DEMADEX) 20 MG tablet Take 20 mg by mouth every Monday, Wednesday, and Friday.    [provider]  TRUE METRIX BLOOD GLUCOSE TEST test strip 1 each by Other route every morning. 06/20/19   [provider]    Physical Exam: Vitals:   12/15/2021 1715 12/16/2021 2000 12/04/2021 2030 12/02/2021 2103  BP:  (!) 129/41 (!) 111/56 (!) 126/46  Pulse: 91 81 77   Resp:  (!) 24 (!) 22 (!) 21  Temp: 97.7 F (36.5 C)     TempSrc: Oral     SpO2: 96% 100% 100% 100%  Weight:      Height:       Heent: anicteric, tongue midline, mucous membranes dry  Neck: no jvd, no bruit Heart: rrr s1, s2, no m/g/r Lung: decrease bs right lung base, + crackles (loud) on the left lower base,  no wheezing, decent air movement Abd: soft, obese, nt, nd, +bs Ext: no c/c/e Skin: no sacral decub,  there is a knot, skin nodule about 0.8cm on the mid left tibia.  Neuro: nonfocal, cn2-12 intact, reflexes 2+ symmetric, diffuse with no clonus, motor 5/5 in all 4 ext Pt appears deconditioned, not sure if he can ambulate without assistance.   Data Reviewed:  Assessment and Plan:  Acute respiratory failure with hypoxia HCAP Blood culture x2 Urine legionella antigen Urine strep antigen Mrsa screen Vanco iv pharmacy to dose Cefepime iv pharmacy to dose Zithromax '500mg'$  iv qday  Hyperkalemia Sodium bicarb 1 amp iv x1 Calcium gluconate iv x1 D50 1 amp iv x1 Insulin regular 7 units iv x1 Kayexalate 30gm po x1 Check bmp at 0100 and 0500  ARF on CRF Hydrate gently with ns iv Check cmp in am  ? Aspiration Speech therapy consult NPO except for sips with medications for now til seen by speech  N/v Zofran '4mg'$  iv q6h prn  Protonix '40mg'$  iv qday  Elevated troponin Trop in am Check cardiac echo  Pleural effusion (right) Will try to u/s guided  thoracentesis by IR  CHF (diastolic) Cont Torsemide '20mg'$  po qday  Hypertension, Hyperlipidemia, CAD on CT scan Cont Amlodipine '10mg'$  po qday Cont Pravastatin '10mg'$  po qhs  Dm2 Fsbs q4h, ISS  Copd Cont spiriva 1 puff qday  Bph Cont Flomax 0.'4mg'$  po qhs  Severe protein calorie malnutrition Dietician consult  DVT prophylaxis: heparin [place] DNR/DNI per yellow ticket and patient Dispo: SNF, [facility]  Pt will be admitted inpatient > 2 nites stay for acute respiratory failure with hypoxia secondary to Hcap, and ARF on CRF, and hyperkalemia, and      Advance Care Planning:   Code Status: Prior DNR/DNI  Consults:  none  Family Communication:  w patient and daughter (by phone), updating her that his wbc 42, and that her is seriously ill.    Severity of Illness: The appropriate patient status for this patient is INPATIENT. Inpatient status is judged to be reasonable and necessary in order to provide the required intensity of service to ensure the patient's safety. The patient's presenting symptoms, physical exam findings, and initial radiographic and laboratory data in the context of their chronic comorbidities is felt to place them at high risk for further clinical deterioration. Furthermore, it is not anticipated that the patient will be medically stable for discharge from the hospital within 2 midnights of admission.   * I certify that at the point of admission it is my clinical judgment that the patient will require inpatient hospital care spanning beyond 2 midnights from the point of admission due to high intensity of service, high risk for further deterioration and high frequency of surveillance required.*  Author: Jani Gravel, MD 12/23/2021 9:51 PM  For on call review [email or web address].

## 2021-12-25 NOTE — ED Notes (Signed)
See triage note. Pt appears non verbal at initial assessment but with arrival of daughter and grandson he was able to communicate in a whisper. States his mouth is dry. Pt had soiled brief. Pt cleaned and placed in fresh brief. Fresh gown and linens.

## 2021-12-25 NOTE — ED Triage Notes (Addendum)
Pt from [facility] and came in by [employer].  Pt was prescribed oxygen at facility. EMS reports pt coming in because his oxygen keeps going down, but comes back up on its own. EMS reports pt is tired but oxygen has been staying at 97% on oxygen.  Pt discharged on 12/22 with pneumonia.   97% on 4lpm 251 CBG 98.73F oral temp.

## 2021-12-25 NOTE — ED Notes (Signed)
Pt to CT

## 2021-12-25 NOTE — ED Provider Triage Note (Signed)
Emergency Medicine Provider Triage Evaluation Note  AIRIK GOODLIN, a 88 y.o. male  was evaluated in triage.  Pt complains of starting for low O2.  Patient on chronic 4 L per [place], comes to the ED from the facility via EMS.  According to staff report, the patient had some decrease O2 saturations.  Patient with a recent history of pneumonia and COVID.  Vital signs stable according to EMS report, and O2 sats were stable in the high 90s.  She himself is without complaint of shortness of breath or pain at this time.  Review of Systems  Positive: SOB Negative: FCS  Physical Exam  BP (!) 123/44 (BP Location: Left Arm)   Pulse 90   Temp 97.8 F (36.6 C) (Axillary)   Resp 20   Ht '5\' 5"'$  (1.651 m)   Wt 72.6 kg   SpO2 97%   BMI 26.63 kg/m  Gen:   Awake, no distress  NAD Resp:  Normal effort CTA MSK:   Moves extremities without difficulty  Other:    Medical Decision Making  Medically screening exam initiated at 1:26 PM.  Appropriate orders placed.  [name] was informed that the remainder of the evaluation will be completed by another provider, this initial triage assessment does not replace that evaluation, and the importance of remaining in the ED until their evaluation is complete.  Geriatric patient presents to the ED via EMS from his facility, with facility reports of low O2 saturations despite 4 L of O2 chronically.  Patient presents in no acute distress and denies any current complaints.   Melvenia Needles, PA-C 12/04/2021 1329

## 2021-12-25 NOTE — Code Documentation (Signed)
CODE SEPSIS - PHARMACY COMMUNICATION  **Broad Spectrum Antibiotics should be administered within 1 hour of Sepsis diagnosis**  Time Code Sepsis Called/Page Received: 1935  Antibiotics Ordered: cefepime + vancomycin  Time of 1st antibiotic administration: 2103  Additional action taken by pharmacy: Reached out to RN - no abx given within golden hour. RN stated there was another patient needing attention   Darrick Penna ,PharmD Clinical Pharmacist  12/22/2021  7:42 PM

## 2021-12-25 NOTE — Progress Notes (Signed)
Pharmacy Antibiotic Note  Arthur [name] is a 87 y.o. male admitted on 12/05/2021 with pneumonia.  Pharmacy has been consulted for Vancomycin , Cefepime dosing.  Plan: Cefepime 2 gm IV X 1 given in ED on 12/24 @ 2103. Cefepime 2 gm IV Q24H ordered to continue on 12/25 @ 2100.  Vancomycin 500 mg IV X 1 given in ED on 12/24 @ 2143.  Additional Vanc 1 gm IV X 1 ordered to make total loading dose of 1500 mg.   Vancomycin 1 gm IV Q48H ordered to start on 12/26 @ 2200.  AUC = 533.7 Vanc trough = 14.2   Height: '5\' 5"'$  (165.1 cm) Weight: 72 kg (158 lb 11.7 oz) IBW/kg (Calculated) : 61.5  Temp (24hrs), Avg:97.8 F (36.6 C), Min:97.7 F (36.5 C), Max:97.8 F (36.6 C)  Recent Labs  Lab 12/19/21 0441 12/21/21 0541 12/22/21 0506 12/23/21 0341 12/02/2021 1825 12/17/2021 1901 12/17/2021 2102  WBC 24.0* 7.8 7.1 6.0 42.1* 39.2*  --   CREATININE 2.24* 2.28* 2.16* 2.01* 2.80*  --   --   LATICACIDVEN  --   --   --   --   --   --  1.2    Estimated Creatinine Clearance: 16.5 mL/min (A) (by C-G formula based on SCr of 2.8 mg/dL (H)).    Allergies  Allergen Reactions   Codeine Other (See Comments)    Constipation    Antimicrobials this admission:   >>    >>   Dose adjustments this admission:   Microbiology results:  BCx:   UCx:    Sputum:    MRSA PCR:   Thank you for allowing pharmacy to be a part of this patient's care.  Arthur [name] D 12/06/2021 11:22 PM

## 2021-12-25 NOTE — ED Triage Notes (Signed)
Pt arrives via EMS from [facility]- pt arrives on 4L [place] which was prescribed for him at the facility- pt continues to have good O2 levels on the 4L- pt recently had pneumonia- pt also had covid 2 weeks ago

## 2021-12-25 NOTE — Progress Notes (Cosign Needed)
Went to get patient for MRI Lspine; pt stated to RN an myself that he wanted to wait til morning.

## 2021-12-25 NOTE — Consult Note (Signed)
PHARMACY -  BRIEF ANTIBIOTIC NOTE   Pharmacy has received consult(s) for cefepime and vancomycin from an ED provider.  The patient's profile has been reviewed for ht/wt/allergies/indication/available labs.    One time order(s) placed for cefepime 2g IV x1 and vancomycin '1500mg'$  IV x1  Further antibiotics/pharmacy consults should be ordered by admitting physician if indicated.                       Thank you, Darrick Penna 12/12/2021  7:42 PM

## 2021-12-25 NOTE — ED Triage Notes (Addendum)
Error in charting at this time

## 2021-12-25 NOTE — Sepsis Progress Note (Signed)
Elink following code sepsis °

## 2021-12-25 NOTE — ED Provider Notes (Signed)
[facility] Provider Note    Event Date/Time   First MD Initiated Contact with Patient 12/22/2021 1726     (approximate)  History   Chief Complaint: Shortness of Breath  HPI  Arthur [name] is a 88 y.o. male with a past medical [place] CHF, COPD, CKD, hypertension, hyperlipidemia who presents to the emergency department for reported low oxygen levels.  Patient was just discharged from the hospital per record review on 12/23/2021 after a 10-day admission for presumed aspiration pneumonia and COVID-19 infection.  Family is here with the patient patient was discharged to [facility] rehab facility where they state the patient has intermittently had low oxygen levels currently satting 97% throughout my evaluation.  Daughter is also concerned because the patient appears more fatigued and somnolent today than what is typical.  Currently patient is lying in bed he is somnolent but awakens to voice.  Physical Exam   Triage Vital Signs: ED Triage Vitals  Enc Vitals Group     BP 12/19/2021 1313 (!) 123/44     Pulse Rate 12/12/2021 1313 90     Resp 12/13/2021 1313 20     Temp 12/30/2021 1313 97.8 F (36.6 C)     Temp Source 12/15/2021 1313 Axillary     SpO2 12/09/2021 1313 97 %     Weight 12/28/2021 1314 160 lb (72.6 kg)     Height 12/24/2021 1314 '5\' 5"'$  (1.651 m)     Head Circumference --      Peak Flow --      Pain Score --      Pain Loc --      Pain Edu? --      Excl. in [place]? --     Most recent vital signs: Vitals:   12/27/2021 1313 12/28/2021 1715  BP: (!) 123/44   Pulse: 90 91  Resp: 20   Temp: 97.8 F (36.6 C) 97.7 F (36.5 C)  SpO2: 97% 96%    General: Awake, no distress.  CV:  Good peripheral perfusion.  Regular rate and rhythm  Resp:  Normal effort.  Equal breath sounds bilaterally.  No obvious wheeze rales or rhonchi Abd:  No distention.  Soft, nontender.  No rebound or guarding.    ED Results / Procedures / Treatments   RADIOLOGY  Chest x-ray viewed and  interpreted by myself appears to have possible right-sided pleural effusion otherwise I do not see any obvious consolidation. Radiology has read the x-ray as improvement compared to the radiograph on 12/18 no evidence of pulmonary edema persistent right lung base opacity no new lung abnormalities.  MEDICATIONS ORDERED IN ED: Medications  sodium chloride 0.9 % bolus 500 mL (has no administration in time range)     IMPRESSION / MDM / ASSESSMENT AND PLAN / ED COURSE  I reviewed the triage vital signs and the nursing notes.  Patient's presentation is most consistent with acute presentation with potential threat to life or bodily function.  Patient presents emergency department for reported hypoxia at his nursing/rehab facility as well as increased somnolence today compared to baseline.  I reviewed the patient's discharge summary from 2 days ago.  Here patient is somnolent but will awaken to voice.  Is complaining of a very dry mouth.  We allow the patient to eat and drink.  Patient has a history of CKD and anemia at baseline we will recheck labs today to ensure no worsening of his baseline labs.  Patient was discharged on 4 L  nasal cannula after his hospitalization.  Currently satting 97% throughout my evaluation with a great waveform on 4 L.  Patient's labs have resulted showing a very significant leukocytosis with a white blood cell count of 42,000.  Patient was on prednisone taper while in the hospital however completed this before he was discharged.  In reviewing the patient's discharge summary and records patient's white blood cell count upon discharge had decreased down to 6000 and is now back up to 42,000 with no known reason for such an increase.  Raising suspicion for continued infection possible aspiration.  Chest x-ray today shows persistent right lung base opacity we will obtain CT imaging to further evaluate to rule out infectious etiology.  CT shows complex loculated right pleural  effusion grossly similar to the patient's prior CT however now with extensive airway impaction and complete collapse of the right lower lobe.  New left lower lobe bronchopneumonia.  Given the patient's CT chest findings significant leukocytosis we will start the patient on broad-spectrum antibiotics to cover for HCAP.  Patient is COVID/flu/RSV continues to be positive for COVID.  Lactic acid reassuringly shows no concerning elevation.  Urinalysis does not show any infection.  We repeated the CBC as a precaution which continues to show significant elevation mostly neutrophils (92%).  Chemistry shows mild renal insufficiency compared to baseline as well as mild hyperkalemia.  Patient receiving IV fluids.  Troponin slightly elevated at 47 however this could very likely be renal induced.  Given the patient's altered mental status significant leukocytosis concern for worsening infection and new left lower lobe pneumonia patient will be admitted to the hospitalist service for further workup and treatment.  CRITICAL CARE Performed by: Harvest Dark   Total critical care time: 30 minutes  Critical care time was exclusive of separately billable procedures and treating other patients.  Critical care was necessary to treat or prevent imminent or life-threatening deterioration.  Critical care was time spent personally by me on the following activities: development of treatment plan with patient and/or surrogate as well as nursing, discussions with consultants, evaluation of patient's response to treatment, examination of patient, obtaining history from patient or surrogate, ordering and performing treatments and interventions, ordering and review of laboratory studies, ordering and review of radiographic studies, pulse oximetry and re-evaluation of patient's condition.   FINAL CLINICAL IMPRESSION(S) / ED DIAGNOSES   Weakness Sepsis Altered mental status Leukocytosis Hospital-acquired pneumonia  Note:   This document was prepared using Dragon voice recognition software and may include unintentional dictation errors.   Harvest Dark, MD 12/16/2021 2207

## 2021-12-26 DIAGNOSIS — J9621 Acute and chronic respiratory failure with hypoxia: Secondary | ICD-10-CM | POA: Diagnosis not present

## 2021-12-26 LAB — TROPONIN I (HIGH SENSITIVITY): Troponin I (High Sensitivity): 41 ng/L — ABNORMAL HIGH (ref <18)

## 2021-12-26 LAB — CBC
HCT: 29.5 % — ABNORMAL LOW (ref 39.0–52.0)
Hemoglobin: 8.7 g/dL — ABNORMAL LOW (ref 13.0–17.0)
MCH: 28.2 pg (ref 26.0–34.0)
MCHC: 29.5 g/dL — ABNORMAL LOW (ref 30.0–36.0)
MCV: 95.8 fL (ref 80.0–100.0)
Platelets: 257 10*3/uL (ref 150–400)
RBC: 3.08 MIL/uL — ABNORMAL LOW (ref 4.22–5.81)
RDW: 19.2 % — ABNORMAL HIGH (ref 11.5–15.5)
WBC: 37 10*3/uL — ABNORMAL HIGH (ref 4.0–10.5)
nRBC: 0.2 % (ref 0.0–0.2)

## 2021-12-26 LAB — COMPREHENSIVE METABOLIC PANEL
ALT: 11 U/L (ref 0–44)
AST: 18 U/L (ref 15–41)
Albumin: 2.8 g/dL — ABNORMAL LOW (ref 3.5–5.0)
Alkaline Phosphatase: 62 U/L (ref 38–126)
Anion gap: 11 (ref 5–15)
BUN: 87 mg/dL — ABNORMAL HIGH (ref 8–23)
CO2: 26 mmol/L (ref 22–32)
Calcium: 7.7 mg/dL — ABNORMAL LOW (ref 8.9–10.3)
Chloride: 104 mmol/L (ref 98–111)
Creatinine, Ser: 3.04 mg/dL — ABNORMAL HIGH (ref 0.61–1.24)
GFR, Estimated: 19 mL/min — ABNORMAL LOW (ref >60)
Glucose, Bld: 156 mg/dL — ABNORMAL HIGH (ref 70–99)
Potassium: 4.7 mmol/L (ref 3.5–5.1)
Sodium: 141 mmol/L (ref 135–145)
Total Bilirubin: 1.3 mg/dL — ABNORMAL HIGH (ref 0.3–1.2)
Total Protein: 5.9 g/dL — ABNORMAL LOW (ref 6.5–8.1)

## 2021-12-26 LAB — PROCALCITONIN: Procalcitonin: 2.26 ng/mL

## 2021-12-26 LAB — BLOOD GAS, VENOUS
Acid-base deficit: 0.3 mmol/L (ref 0.0–2.0)
Bicarbonate: 27.4 mmol/L (ref 20.0–28.0)
O2 Saturation: 29.7 %
Patient temperature: 37
pCO2, Ven: 57 mmHg (ref 44–60)
pH, Ven: 7.29 (ref 7.25–7.43)
pO2, Ven: <31 mmHg — CL (ref 32–45)

## 2021-12-26 LAB — C-REACTIVE PROTEIN: CRP: 12.8 mg/dL — ABNORMAL HIGH (ref <1.0)

## 2021-12-26 LAB — LACTATE DEHYDROGENASE: LDH: 199 U/L — ABNORMAL HIGH (ref 98–192)

## 2021-12-26 LAB — SEDIMENTATION RATE: Sed Rate: 45 mm/hr — ABNORMAL HIGH (ref 0–20)

## 2021-12-26 LAB — MRSA NEXT GEN BY PCR, NASAL: MRSA by PCR Next Gen: NOT DETECTED

## 2021-12-26 MED ORDER — SODIUM CHLORIDE 0.9 % IV SOLN: INTRAVENOUS | Status: AC

## 2021-12-26 MED ORDER — ACETYLCYSTEINE 20 % IN SOLN
4.0000 mL | Freq: Two times a day (BID) | RESPIRATORY_TRACT | Status: DC
  Administered 2021-12-26 – 2021-12-28 (×4): 4 mL via RESPIRATORY_TRACT
  Filled 2021-12-26 (×8): qty 4

## 2021-12-26 MED ORDER — IPRATROPIUM-ALBUTEROL 0.5-2.5 (3) MG/3ML IN SOLN
3.0000 mL | Freq: Two times a day (BID) | RESPIRATORY_TRACT | Status: DC
  Administered 2021-12-26 – 2021-12-28 (×4): 3 mL via RESPIRATORY_TRACT
  Filled 2021-12-26 (×5): qty 3

## 2021-12-26 NOTE — ED Notes (Signed)
Report received from Lauren, RN.

## 2021-12-26 NOTE — ED Notes (Signed)
Lauren, RN, and [place], [place], provided total pericare, clean sheets, gown, chux, blankets, and brief for pt due to pt soiling his brief. Pt was thoroughly cleaned and there were no open sores or redness visible upon inspection of the pts back, bottoms, genitalia, legs, or abdomen. Barrier cream was applied as a preventative measure due to the pts liquid bowel movement.   Pt was also offered the urinal due to the pt stating he "needs to pee." Pt unable to urinate at this time.  Pt repositioned in bed and set in position of comfort. Pt was hooked back up to the cardiac monitor and call bell was placed within the pts reach. Bed in lowest position. Pt instructed to press call light in case he needed any assistance.

## 2021-12-26 NOTE — ED Notes (Signed)
Patient will not keep oxygen mak on. Patient placed on 4L [place]. 99% on 4L 

## 2021-12-26 NOTE — Progress Notes (Signed)
PROGRESS NOTE    MAKYI LEDO  PIR:[id number] DOB: [birth year] DOA: 12/31/2021 PCP: Baxter Hire, MD  [id number]  LOS: 1 day   Brief hospital course:   Assessment & Plan: CAPONE SCHWINN is a 88 y.o. male with medical history significant of hypertension, hyperlipidemia, Dm2, CHF (diastolic), Copd, OSA on CPAP, R pleural effusion, w recemt admission 12/12/21 for aspiration pneumonia, Covid -19 positive (12/12/21), apparently presents with increase o2 requirement, hypoxia while at SNF.   Acute on chronic respiratory failure with hypoxia On 2L at baseline --O2 sats noted to vary, seems to be dropping during sleep.  However, CT chest did show new left lower lobe bronchopneumonia.  Elevated WBC --treat as HCAP --Continue supplemental O2 to keep sats >=90%, wean as tolerated  PNA --CT chest did show new left lower lobe bronchopneumonia.  During last hospitalization, pt was treated with a course of Unasyn. --started on vanc, cefepime and azithromycin.  MRSA screen neg Plan: --cont cefepime and azithromycin for now.  Complex parapneumonic pleural effusion, chronic --Current CT chest showed Complex loculated right pleural effusion is grossly similar to chest CT and ultrasound 12/12/2021.  Also showed "Extensive airway impaction and complete collapse of the right lower lobe. Improved aeration of the right middle lobe compared to prior CT." --during last hospitalization, Pulmonary/IR did not deem pt a candidate for thoracentesis/chest tube due to associated compressive atelectasis. --pt was discharged on torsemide Plan: --will not order thoracentesis --hold torsemide 2/2 AKI  Recent COVID infection --already out of isolation period  COPD and Centrilobular Emphysema  --recently completed steroid course of tx of exacerbation. --cont bronchodilator  Hyperkalemia --resolved after D50/insulin and Kayexalate   AKI on CKD4 --Cr 2.8 on presentation, was 2.01 two days ago.  BUN increased  from 66 to 81.  Likely had poor oral intake and dehydration Plan: --NS'@50'$  for 20 hours  Dysphagia --noted to have difficulty and coughing with swallowing pills --SLP eval   Elevated troponin likely due to demand ischemia --trop 40's and flat   Chronic dCHF  --hold torsemide 2/2 AKI   Hypertension --cont amlodipine  Hyperlipidemia Cont Pravastatin '10mg'$  po qhs   Controlled IDDM-2    OSA --refused CPAP during last hospitalization   Bph Cont Flomax 0.'4mg'$  po qhs    DVT prophylaxis: Heparin SQ Code Status: DNR  Family Communication:  Level of care: Med-Surg Dispo:   The patient is from: SNF rehab Anticipated d/c is to: SNF rehab Anticipated d/c date is: > 3 days   Subjective and Interval History:  Pt was lethargic.  RN noted O2 sats 84% when he sleeps. Awake he is 96%.   Pt had difficulty swallowing pills and coughing with it, so he was made NPO pending SLP eval.   Objective: Vitals:   12/26/21 1400 12/26/21 1436 12/26/21 1510 12/26/21 1624  BP:      Pulse:  77 84 90  Resp:  '18 20 20  '$ Temp:      TempSrc:      SpO2: 100% 94% 96% 95%  Weight:      Height:        Intake/Output Summary (Last 24 hours) at 12/26/2021 1645 Last data filed at 12/26/2021 0155 Gross per 24 hour  Intake 542.35 ml  Output 350 ml  Net 192.35 ml   Filed Weights   12/31/2021 1314 12/11/2021 1327  Weight: 72.6 kg 72 kg    Examination:   Constitutional: NAD, leghargic but arousable HEENT: conjunctivae and lids normal, EOMI CV: No  cyanosis.   RESP: gurgling breath sounds, on 2L SKIN: warm, dry   Data Reviewed: I have personally reviewed labs and imaging studies  Time spent: 50 minutes  Enzo Bi, MD [facility] If 7PM-7AM, please contact night-coverage 12/26/2021, 4:45 PM

## 2021-12-26 NOTE — ED Notes (Signed)
Pt was placed on a ventri mask at this time due to low O2 levels with a [place] and a mouth breather. Pt is tolerating well and O2 stats increased to 95%.

## 2021-12-27 DIAGNOSIS — J9621 Acute and chronic respiratory failure with hypoxia: Secondary | ICD-10-CM | POA: Diagnosis not present

## 2021-12-27 LAB — CBC
HCT: 26.4 % — ABNORMAL LOW (ref 39.0–52.0)
Hemoglobin: 7.7 g/dL — ABNORMAL LOW (ref 13.0–17.0)
MCH: 27.7 pg (ref 26.0–34.0)
MCHC: 29.2 g/dL — ABNORMAL LOW (ref 30.0–36.0)
MCV: 95 fL (ref 80.0–100.0)
Platelets: 199 10*3/uL (ref 150–400)
RBC: 2.78 MIL/uL — ABNORMAL LOW (ref 4.22–5.81)
RDW: 19.8 % — ABNORMAL HIGH (ref 11.5–15.5)
WBC: 30.5 10*3/uL — ABNORMAL HIGH (ref 4.0–10.5)
nRBC: 0.1 % (ref 0.0–0.2)

## 2021-12-27 LAB — RESPIRATORY PANEL BY PCR

## 2021-12-27 LAB — BLOOD GAS, ARTERIAL
Acid-base deficit: 2.8 mmol/L — ABNORMAL HIGH (ref 0.0–2.0)
Bicarbonate: 24.1 mmol/L (ref 20.0–28.0)
O2 Content: 2.5 L/min
O2 Saturation: 94.8 %
Patient temperature: 37
pCO2 arterial: 49 mmHg — ABNORMAL HIGH (ref 32–48)
pH, Arterial: 7.3 — ABNORMAL LOW (ref 7.35–7.45)
pO2, Arterial: 79 mmHg — ABNORMAL LOW (ref 83–108)

## 2021-12-27 LAB — MAGNESIUM: Magnesium: 2.8 mg/dL — ABNORMAL HIGH (ref 1.7–2.4)

## 2021-12-27 LAB — BASIC METABOLIC PANEL
Anion gap: 14 (ref 5–15)
BUN: 98 mg/dL — ABNORMAL HIGH (ref 8–23)
CO2: 21 mmol/L — ABNORMAL LOW (ref 22–32)
Calcium: 6.9 mg/dL — ABNORMAL LOW (ref 8.9–10.3)
Chloride: 102 mmol/L (ref 98–111)
Creatinine, Ser: 3.95 mg/dL — ABNORMAL HIGH (ref 0.61–1.24)
GFR, Estimated: 14 mL/min — ABNORMAL LOW (ref >60)
Glucose, Bld: 193 mg/dL — ABNORMAL HIGH (ref 70–99)
Potassium: 5.3 mmol/L — ABNORMAL HIGH (ref 3.5–5.1)
Sodium: 137 mmol/L (ref 135–145)

## 2021-12-27 LAB — URINE CULTURE: Culture: NO GROWTH

## 2021-12-27 MED ORDER — SODIUM ZIRCONIUM CYCLOSILICATE 10 G PO PACK
10.0000 g | PACK | Freq: Two times a day (BID) | ORAL | Status: DC
  Filled 2021-12-27 (×5): qty 1

## 2021-12-27 MED ORDER — SODIUM CHLORIDE 0.9 % IV SOLN: INTRAVENOUS | Status: AC

## 2021-12-27 NOTE — Progress Notes (Signed)
SLP Cancellation Note  Patient Details Name: Arthur [name] MRN: [id number] DOB: 1933-10-27   Cancelled treatment:       Reason Eval/Treat Not Completed:  (chart reviewed; consulted NSG and met w/ pt.) Pt had a recent admit w/ concerns of dysphagia and pulmonary decline then. At this admit, Chest CT reveals: " Extensive airway impaction and complete collapse of the right lower lobe. Improved aeration of the right middle lobe compared to prior CT. Pneumonia is difficult to exclude. New left lower lobe bronchopneumonia.".  A BSE consult was placed at this admit, however, w/ pt's recent history and concerns of dysphagia, recommend a MBSS for pt to objectively assess swallow function b/f initiating an oral diet.  The above was discussed w/ NSG and MD. ST services will f/u w/ MBSS for pt tomorrow. Recommend strict NPO status w/ frequent oral care for hygiene and stimulation of swallowing.      Orinda Kenner, MS, CCC-SLP Speech Language Pathologist Rehab Services; [place] [phone] (ascom) Hiren Peplinski 12/27/2021, 1:42 PM

## 2021-12-27 NOTE — Progress Notes (Signed)
Order received from Colon Flattery NP for NS at 7m/hr

## 2021-12-27 NOTE — Progress Notes (Signed)
PROGRESS NOTE    GAETANO ROMBERGER  MWU:[id number] DOB: Jan 06, 1933 DOA: 12/02/2021 PCP: Baxter Hire, MD  [id number]  LOS: 2 days   Brief hospital course:   Assessment & Plan: Arthur [name] is a 88 y.o. male with a PMH of HTN, DM2, Advanced COPD and Chronic Hypoxia on 2L [place] at baseline, OSA, Diastolic Heart Failure, complex right pleural effusion (last thoracentesis on 01/2020), CKD4 with anemia on EPO injections, recently discharged on 12/23/21 after treating for pneumonia and Covid -19 who presented from SNF rehab with hypoxia and increase O2 requirement.   Acute on chronic respiratory failure with hypoxia On 2L at baseline --O2 sats noted to vary, seems to be dropping during sleep.  However, CT chest did show new left lower lobe bronchopneumonia.  Elevated WBC --treat as HCAP --Continue supplemental O2 to keep sats >=90%, wean as tolerated  LLL PNA --CT chest did show new left lower lobe bronchopneumonia.  During last hospitalization, pt was treated with a course of Unasyn. --started on vanc, cefepime and azithromycin.  MRSA screen neg Plan: --cont cefepime and azithromycin  Complex parapneumonic pleural effusion, chronic --Current CT chest showed Complex loculated right pleural effusion is grossly similar to chest CT and ultrasound 12/12/2021.  Also showed "Extensive airway impaction and complete collapse of the right lower lobe. Improved aeration of the right middle lobe compared to prior CT." --during last hospitalization, Pulmonary/IR did not deem pt a candidate for thoracentesis/chest tube due to associated compressive atelectasis. --pt was discharged on torsemide Plan: --will not order thoracentesis --hold torsemide 2/2 AKI  Recent COVID infection --Dx on 12/12/21, already out of isolation period  COPD and Centrilobular Emphysema  --recently completed steroid course of tx of exacerbation. --cont bronchodilator  Hyperkalemia --K+ 5.9 on presentation, improved after  D50/insulin and Kayexalate.     AKI on CKD4 --Cr 2.8 on presentation, was 2.01 two days ago.  BUN increased from 66 to 81.  Likely had poor oral intake and dehydration --Cr worsened to 3.95 this morning despite gentle MIVF. Plan: --consult nephrology today --cont MIVF'@75'$ , per nephro  Dysphagia --noted to have difficulty and coughing with swallowing pills --SLP eval, plan for modified barium swallow   Elevated troponin likely due to demand ischemia --trop 40's and flat   Chronic dCHF  --hold torsemide 2/2 AKI   Hypertension --cont amlodipine  Hyperlipidemia Cont Pravastatin '10mg'$  po qhs   Controlled IDDM-2  --hold insulin for now since NPO   OSA --refused CPAP during last hospitalization   Bph Cont Flomax 0.'4mg'$  po qhs  Chronic back pain --admit physician ordered MRI, however, per daughter, pt needed sedation for MRI scan.  Back pain is chronic, daughter preferred pt not be sedated for MRI now due to his pulmonary issues.    DVT prophylaxis: Heparin SQ Code Status: DNR daughter agreed to palliative consult Family Communication: daughter updated at the bedside today Level of care: Med-Surg Dispo:   The patient is from: SNF rehab Anticipated d/c is to: SNF rehab Anticipated d/c date is: > 3 days.  On IV abx for PNA, AKI worsening   Subjective and Interval History:  Pt had been sleeping, but arousable.  SLP found pt unsafe to swallow, so NPO pending modified barium swallow.    Cr worsening, so nephro consulted today.   Objective: Vitals:   12/27/21 1130 12/27/21 1133 12/27/21 1616 12/27/21 2027  BP:   (!) 123/33 122/73  Pulse:   73 78  Resp:   14   Temp:  97.9 F (36.6 C) 98 F (36.7 C)  TempSrc:    Oral  SpO2: (!) 86% 94% 100% 100%  Weight:      Height:        Intake/Output Summary (Last 24 hours) at 12/27/2021 2123 Last data filed at 12/27/2021 1848 Gross per 24 hour  Intake 1129.97 ml  Output 150 ml  Net 979.97 ml   Filed Weights   12/13/2021  1314 12/11/2021 1327 12/27/21 0502  Weight: 72.6 kg 72 kg 78.2 kg    Examination:   Constitutional: NAD CV: No cyanosis.   RESP: normal respiratory effort, on RA SKIN: warm, dry   Data Reviewed: I have personally reviewed labs and imaging studies  Time spent: 50 minutes  Enzo Bi, MD [facility] If 7PM-7AM, please contact night-coverage 12/27/2021, 9:23 PM

## 2021-12-27 NOTE — Progress Notes (Signed)
MD Billie Ruddy and MD Candiss Norse updated on urine output of 150 ml this shift. No new orders at this time. Palliative consulted

## 2021-12-27 NOTE — Progress Notes (Signed)
Dr updated that I help a.m. oral meds due to the patient being NPO

## 2021-12-27 NOTE — Consult Note (Addendum)
[facility]  CONSULT NOTE    Date: 12/27/2021                  Patient Name:  Arthur [name]  MRN: [id number]  DOB: [birth year]  Age / Sex: 88 y.o., male         PCP: Baxter Hire, MD                 Service Requesting Consult: [facility]                 Reason for Consult: Acute kidney injury            History of Present Illness: [name] is a 88 y.o.  male with past medical conditions including hypertension, diabetes, diastolic heart failure, COPD with CPAP, hyperlipidemia, who was admitted to [facility] on 12/10/2021 for Weakness [R53.1] Acute respiratory failure with hypoxia ([place]) [J96.01] HCAP (healthcare-associated pneumonia) [J18.9] Sepsis, due to unspecified organism, unspecified whether acute organ dysfunction present [facility]) [A41.9]  Patient presents to the emergency department with complaints of shortness of breath.  Patient was seen in our office in December 2020 by Dr. Candiss Norse and was lost to follow-up.  Patient is seen laying in bed, no family at bedside.  Patient currently somnolent, on supplemental oxygen.  Responsive to abdominal palpation.  Trace lower extremity edema.  Labs on ED arrival show potassium 5.9, glucose 178, BUN 85, creatinine 2.88 with GFR 20, troponin 41, elevated WBC 37.0 with hemoglobin 8.7.  Expanded respiratory panel negative.  CT chest shows right pleural effusion and  complete collapse of right lower lobe.  Chest x-ray shows persistent right lung base opacity.   Medications: Outpatient medications: Medications Prior to Admission  Medication Sig Dispense Refill Last Dose   amLODipine (NORVASC) 10 MG tablet Take 1 tablet (10 mg total) by mouth daily.   12/22/2021 at 0900   insulin aspart (NOVOLOG) 100 UNIT/ML injection 4 units prior to meals if sugars above 200 10 mL 11 12/18/2021 at 0800   Insulin Glargine Solostar (LANTUS) 100 UNIT/ML Solostar Pen Inject 4 Units into the skin at bedtime.   12/24/2021 at 2100   pravastatin  (PRAVACHOL) 10 MG tablet Take 10 mg by mouth at bedtime.   12/24/2021 at 2100   promethazine (PHENERGAN) 25 MG tablet Take 25 mg by mouth every 4 (four) hours as needed for nausea or vomiting.   12/28/2021 at 0132   tamsulosin (FLOMAX) 0.4 MG CAPS capsule Take 0.4 mg by mouth daily.   12/27/2021 at 0900   tiotropium (SPIRIVA) 18 MCG inhalation capsule Place 18 mcg into inhaler and inhale daily.   12/09/2021 at 0900   torsemide (DEMADEX) 20 MG tablet Take 20 mg by mouth every Monday, Wednesday, and Friday.   12/23/2021 at unknown   acetaminophen (TYLENOL) 325 MG tablet Take 2 tablets (650 mg total) by mouth every 6 (six) hours as needed for mild pain (or Fever >/= 101).   12/24/2021 at 2300   albuterol (VENTOLIN HFA) 108 (90 Base) MCG/ACT inhaler Inhale 2 puffs into the lungs every 6 (six) hours as needed for wheezing or shortness of breath. 1 Inhaler 2 prn at prn   LEVEMIR 100 UNIT/ML injection Inject 0.04 mLs (4 Units total) into the skin at bedtime. (Patient not taking: Reported on 12/26/2021) 10 mL 0 Not Taking   TRUE METRIX BLOOD GLUCOSE TEST test strip 1 each by Other route every morning.       Current medications:  Current Facility-Administered Medications  Medication Dose Route Frequency Provider Last Rate Last Admin   0.9 %  sodium chloride infusion   Intravenous Continuous Colon Flattery, NP 75 mL/hr at 12/27/21 1609 Infusion Verify at 12/27/21 1609   acetaminophen (TYLENOL) tablet 650 mg  650 mg Oral Q6H PRN Jani Gravel, MD       Or   acetaminophen (TYLENOL) suppository 650 mg  650 mg Rectal Q6H PRN Jani Gravel, MD       acetylcysteine (MUCOMYST) 20 % nebulizer / oral solution 4 mL  4 mL Nebulization BID Enzo Bi, MD   4 mL at 12/27/21 0927   amLODipine (NORVASC) tablet 10 mg  10 mg Oral Daily Jani Gravel, MD       azithromycin [facility]) 500 mg in sodium chloride 0.9 % 250 mL IVPB  500 mg Intravenous Q24H Jani Gravel, MD   Stopped at 12/27/21 0036   ceFEPIme (MAXIPIME) 2 g in sodium  chloride 0.9 % 100 mL IVPB  2 g Intravenous Q24H Jani Gravel, MD   Stopped at 12/26/21 2328   heparin injection 5,000 Units  5,000 Units Subcutaneous Lysle Dingwall, MD   5,000 Units at 12/27/21 1554   hydrALAZINE (APRESOLINE) injection 5 mg  5 mg Intravenous Q6H PRN Jani Gravel, MD       ipratropium-albuterol (DUONEB) 0.5-2.5 (3) MG/3ML nebulizer solution 3 mL  3 mL Nebulization BID Enzo Bi, MD   3 mL at 12/27/21 0926   ondansetron (ZOFRAN) injection 4 mg  4 mg Intravenous Once Jani Gravel, MD       ondansetron [facility]) injection 4 mg  4 mg Intravenous Q6H PRN Jani Gravel, MD   4 mg at 12/11/2021 2310   pravastatin (PRAVACHOL) tablet 10 mg  10 mg Oral Loma Sousa, MD   10 mg at 12/26/21 2306   sodium zirconium cyclosilicate (LOKELMA) packet 10 g  10 g Oral BID Enzo Bi, MD       tamsulosin [facility]) capsule 0.4 mg  0.4 mg Oral Daily Jani Gravel, MD   0.4 mg at 12/26/21 1154   tiotropium [facility]) inhalation capsule ([facility] use ONLY) 18 mcg  18 mcg Inhalation Daily Jani Gravel, MD   18 mcg at 12/27/21 1541   Facility-Administered Medications Ordered in Other Encounters  Medication Dose Route Frequency Provider Last Rate Last Admin   0.9 %  sodium chloride infusion   Intravenous Continuous Earlie Server, MD 10 mL/hr at 10/15/20 1452 New Bag at 10/15/20 1452      Allergies: Allergies  Allergen Reactions   Codeine Other (See Comments)    Constipation      Past Medical History: Past Medical History:  Diagnosis Date   Anemia of chronic kidney failure 08/21/2018   Arthritis    lower back   CHF (congestive heart failure) (HCC)    CKD (chronic kidney disease), stage III (HCC)    COPD (chronic obstructive pulmonary disease) ([place])    Diabetes mellitus without complication ([place])    type 2   Dyspnea    when active -wears oxygen 2L via [place]   History of blood transfusion    Hyperlipidemia    Hypertension    OSA on CPAP 12/12/2021   Requires continuous at home supplemental oxygen    2L via     Squamous cell carcinoma in situ 09/17/2012   Right upper nasal dorsum. SCCis arising in [place]   Squamous cell carcinoma of skin 02/03/2020   R lat cheek, EDC  Past Surgical History: Past Surgical History:  Procedure Laterality Date   COLONOSCOPY WITH PROPOFOL N/A 06/11/2018   Procedure: COLONOSCOPY WITH PROPOFOL;  Surgeon: Jonathon Bellows, MD;  Location[facility] ENDOSCOPY;  Service: Gastroenterology;  Laterality: N/A;   COLONOSCOPY WITH PROPOFOL N/A 06/13/2018   Procedure: COLONOSCOPY WITH PROPOFOL;  Surgeon: Jonathon Bellows, MD;  Location: [facility] ENDOSCOPY;  Service: Gastroenterology;  Laterality: N/A;   ESOPHAGOGASTRODUODENOSCOPY Left 06/10/2018   Procedure: ESOPHAGOGASTRODUODENOSCOPY (EGD);  Surgeon: Jonathon Bellows, MD;  Location[facility] ENDOSCOPY;  Service: Gastroenterology;  Laterality: Left;   EYE SURGERY     removed cataracts   GIVENS CAPSULE STUDY N/A 06/28/2018   Procedure: GIVENS CAPSULE STUDY;  Surgeon: Lucilla Lame, MD;  Location: [facility] ENDOSCOPY;  Service: Endoscopy;  Laterality: N/A;   GIVENS CAPSULE STUDY N/A 06/30/2018   Procedure: GIVENS CAPSULE STUDY;  Surgeon: Jonathon Bellows, MD;  Location: [facility] ENDOSCOPY;  Service: Gastroenterology;  Laterality: N/A;   KYPHOPLASTY N/A 10/31/2018   Procedure: KYPHOPLASTY L2;  Surgeon: [name], MD;  Location: [facility] ORS;  Service: Orthopedics;  Laterality: N/A;   KYPHOPLASTY N/A 12/24/2018   Procedure: KYPHOPLASTY L3;  Surgeon: [name], MD;  Location: [facility] ORS;  Service: Orthopedics;  Laterality: N/A;   KYPHOPLASTY N/A 02/18/2019   Procedure: L2 KYPHOPLASTY;  Surgeon: [name], MD;  Location: [facility] ORS;  Service: Orthopedics;  Laterality: N/A;   RADIOLOGY WITH ANESTHESIA N/A 01/28/2019   Procedure: MRI LUMBER SPINE WITHOUT CONTRAST;  Surgeon: Radiologist, Medication, MD;  Location: [place];  Service: Radiology;  Laterality: N/A;     Family History: Family History  Problem Relation Age of Onset   COPD Mother    Cancer Father      Social  History: Social History   Socioeconomic History   Marital status: Widowed    Spouse name: Not on file   Number of children: Not on file   Years of education: Not on file   Highest education level: Not on file  Occupational History   Not on file  Tobacco Use   Smoking status: Former    Packs/day: 2.00    Years: 35.00    Total pack years: 70.00    Types: Cigarettes    Quit date: 11/04/1982    Years since quitting: 39.1   Smokeless tobacco: Current    Types: Chew  Vaping Use   Vaping Use: Never used  Substance and Sexual Activity   Alcohol use: Not Currently   Drug use: No   Sexual activity: Not Currently  Other Topics Concern   Not on file  Social History Narrative   Not on file   Social Determinants of Health   Financial Resource Strain: Low Risk  (07/12/2018)   Overall Financial Resource Strain (CARDIA)    Difficulty of Paying Living Expenses: Not hard at all  Food Insecurity: No Food Insecurity (12/27/2021)   Hunger Vital Sign    Worried About Running Out of Food in the Last Year: Never true    [place] in the Last Year: Never true  Transportation Needs: No Transportation Needs (12/27/2021)   PRAPARE - Hydrologist (Medical): No    Lack of Transportation (Non-Medical): No  Physical Activity: Unknown (06/27/2018)   Exercise Vital Sign    Days of Exercise per Week: 0 days    Minutes of Exercise per Session: Not on file  Stress: No Stress Concern Present (06/27/2018)   [place]  Feeling of Stress : Not at all  Social Connections: Not on file  Intimate Partner Violence: Not At Risk (12/27/2021)   Humiliation, Afraid, Rape, and Kick questionnaire    Fear of Current or Ex-Partner: No    Emotionally Abused: No    Physically Abused: No    Sexually Abused: No     Review of Systems: Review of Systems  Unable to perform ROS: Mental status change    Vital  Signs: Blood pressure (!) 123/33, pulse 73, temperature 97.9 F (36.6 C), resp. rate 14, height '5\' 5"'$  (1.651 m), weight 78.2 kg, SpO2 100 %.  Weight trends: Filed Weights   12/23/2021 1314 12/10/2021 1327 12/27/21 0502  Weight: 72.6 kg 72 kg 78.2 kg    Physical Exam: General: NAD, ill-appearing  Head: Normocephalic, atraumatic.  Dry oral mucosal membranes  Eyes: Anicteric  Lungs:  Diminished in bases, normal effort  Heart: Regular rate and rhythm  Abdomen:  Soft, nontender  Extremities: Trace peripheral edema.  Neurologic: Somnolent  Skin: No lesions  Access: None     Lab results: Basic Metabolic Panel: Recent Labs  Lab 12/21/21 0541 12/22/21 0506 12/23/21 0341 12/02/2021 1825 12/13/2021 2312 12/26/21 0450 12/27/21 0402  NA 139 138 137   < > 138 141 137  K 5.2* 5.5* 5.0   < > 5.9* 4.7 5.3*  CL 109 103 102   < > 105 104 102  CO2 '28 29 29   '$ < > 27 26 21*  GLUCOSE 71 109* 78   < > 178* 156* 193*  BUN 66* 66* 66*   < > 85* 87* 98*  CREATININE 2.28* 2.16* 2.01*   < > 2.88*  2.90* 3.04* 3.95*  CALCIUM 7.6* 7.7* 7.6*   < > 7.2* 7.7* 6.9*  MG 2.6* 2.5* 2.3  --   --   --  2.8*   < > = values in this interval not displayed.    Liver Function Tests: Recent Labs  Lab 12/22/2021 1825 12/26/21 0450  AST 16 18  ALT 14 11  ALKPHOS 71 62  BILITOT 1.6* 1.3*  PROT 6.1* 5.9*  ALBUMIN 2.9* 2.8*   No results for input(s): "LIPASE", "AMYLASE" in the last 168 hours. No results for input(s): "AMMONIA" in the last 168 hours.  CBC: Recent Labs  Lab 12/23/21 0341 12/19/2021 1825 12/23/2021 1901 12/26/21 0450 12/27/21 0402  WBC 6.0 42.1* 39.2* 37.0* 30.5*  NEUTROABS  --   --  35.8*  --   --   HGB 7.4* 9.1* 8.4* 8.7* 7.7*  HCT 24.8* 31.4* 28.7* 29.5* 26.4*  MCV 92.2 95.4 96.0 95.8 95.0  PLT 251 260 244 257 199    Cardiac Enzymes: No results for input(s): "CKTOTAL", "CKMB", "CKMBINDEX", "TROPONINI" in the last 168 hours.  BNP: Invalid input(s): "POCBNP"  CBG: Recent Labs   Lab 12/22/21 1158 12/22/21 1708 12/22/21 2153 12/23/21 0913 12/23/21 1219  GLUCAP 148* 129* 113* 76 145*    Microbiology: Results for orders placed or performed during the hospital encounter of 12/23/2021  Blood Culture (routine x 2)     Status: None (Preliminary result)   Collection Time: 12/18/2021  9:00 PM   Specimen: BLOOD  Result Value Ref Range Status   Specimen Description BLOOD BLOOD LEFT ARM  Final   Special Requests   Final    BOTTLES DRAWN AEROBIC AND ANAEROBIC Blood Culture adequate volume   Culture   Final    NO GROWTH 2 DAYS Performed at [facility],  [place], [place] [address]    Report Status PENDING  Incomplete  Resp panel by RT-PCR (RSV, Flu A&B, Covid) Anterior Nasal Swab     Status: Abnormal   Collection Time: 12/24/2021  9:02 PM   Specimen: Anterior Nasal Swab  Result Value Ref Range Status   SARS Coronavirus 2 by RT PCR POSITIVE (A) NEGATIVE Final    Comment: (NOTE) SARS-CoV-2 target nucleic acids are DETECTED.  The SARS-CoV-2 RNA is generally detectable in upper respiratory specimens during the acute phase of infection. Positive results are indicative of the presence of the identified virus, but do not rule out bacterial infection or co-infection with other pathogens not detected by the test. Clinical correlation with patient history and other diagnostic information is necessary to determine patient infection status. The expected result is Negative.  Fact Sheet for Patients: EntrepreneurPulse.com.au  Fact Sheet for Healthcare Providers: IncredibleEmployment.be  This test is not yet approved or cleared by the [place] FDA and  has been authorized for detection and/or diagnosis of SARS-CoV-2 by FDA under an Emergency Use Authorization (EUA).  This EUA will remain in effect (meaning this test can be used) for the duration of  the COVID-19 declaration under Section 564(b)(1) of the A ct,  21 U.S.C. section 360bbb-3(b)(1), unless the authorization is terminated or revoked sooner.     Influenza A by PCR NEGATIVE NEGATIVE Final   Influenza B by PCR NEGATIVE NEGATIVE Final    Comment: (NOTE) The Xpert Xpress SARS-CoV-2/FLU/RSV plus assay is intended as an aid in the diagnosis of influenza from Nasopharyngeal swab specimens and should not be used as a sole basis for treatment. Nasal washings and aspirates are unacceptable for Xpert Xpress SARS-CoV-2/FLU/RSV testing.  Fact Sheet for Patients: EntrepreneurPulse.com.au  Fact Sheet for Healthcare Providers: IncredibleEmployment.be  This test is not yet approved or cleared by the [place] FDA and has been authorized for detection and/or diagnosis of SARS-CoV-2 by FDA under an Emergency Use Authorization (EUA). This EUA will remain in effect (meaning this test can be used) for the duration of the COVID-19 declaration under Section 564(b)(1) of the Act, 21 U.S.C. section 360bbb-3(b)(1), unless the authorization is terminated or revoked.     Resp Syncytial Virus by PCR NEGATIVE NEGATIVE Final    Comment: (NOTE) Fact Sheet for Patients: EntrepreneurPulse.com.au  Fact Sheet for Healthcare Providers: IncredibleEmployment.be  This test is not yet approved or cleared by the [place] FDA and has been authorized for detection and/or diagnosis of SARS-CoV-2 by FDA under an Emergency Use Authorization (EUA). This EUA will remain in effect (meaning this test can be used) for the duration of the COVID-19 declaration under Section 564(b)(1) of the Act, 21 U.S.C. section 360bbb-3(b)(1), unless the authorization is terminated or revoked.  Performed at [facility], [address] [place], [place] [address]   Blood Culture (routine x 2)     Status: None (Preliminary result)   Collection Time: 12/12/2021  9:02 PM   Specimen: BLOOD  Result  Value Ref Range Status   Specimen Description BLOOD BLOOD LEFT ARM  Final   Special Requests   Final    BOTTLES DRAWN AEROBIC AND ANAEROBIC Blood Culture adequate volume   Culture   Final    NO GROWTH 2 DAYS Performed at [facility], [address], [place], [place] [address]    Report Status PENDING  Incomplete  Urine Culture     Status: None   Collection Time: 12/19/2021  9:02 PM  Specimen: In/Out Cath Urine  Result Value Ref Range Status   Specimen Description   Final    IN/OUT CATH URINE Performed at [facility], [address] [place], [place] [address]    Special Requests   Final    NONE Performed at [facility], [address] [place], [place] [address]    Culture   Final    NO GROWTH Performed at [facility], [place] [address], [place], [place] [address]    Report Status 12/27/2021 FINAL  Final  MRSA Next Gen by PCR, Nasal     Status: None   Collection Time: 12/31/2021 11:12 PM   Specimen: Nasal Mucosa; Nasal Swab  Result Value Ref Range Status   MRSA by PCR Next Gen NOT DETECTED NOT DETECTED Final    Comment: (NOTE) The GeneXpert MRSA Assay (FDA approved for NASAL specimens only), is one component of a comprehensive MRSA colonization surveillance program. It is not intended to diagnose MRSA infection nor to guide or monitor treatment for MRSA infections. Test performance is not FDA approved in patients less than 69 years old. Performed at [facility], [place], [place] [address]   Respiratory (~20 pathogens) panel by PCR     Status: None   Collection Time: 12/26/21  7:48 PM   Specimen: Nasopharyngeal Swab; Respiratory  Result Value Ref Range Status   Adenovirus NOT DETECTED NOT DETECTED Final   Coronavirus 229E NOT DETECTED NOT DETECTED Final    Comment: (NOTE) The Coronavirus on the Respiratory Panel, DOES NOT test for the novel  Coronavirus (2019 nCoV)    Coronavirus HKU1 NOT DETECTED NOT  DETECTED Final   Coronavirus NL63 NOT DETECTED NOT DETECTED Final   Coronavirus OC43 NOT DETECTED NOT DETECTED Final   Metapneumovirus NOT DETECTED NOT DETECTED Final   Rhinovirus / Enterovirus NOT DETECTED NOT DETECTED Final   Influenza A NOT DETECTED NOT DETECTED Final   Influenza B NOT DETECTED NOT DETECTED Final   Parainfluenza Virus 1 NOT DETECTED NOT DETECTED Final   Parainfluenza Virus 2 NOT DETECTED NOT DETECTED Final   Parainfluenza Virus 3 NOT DETECTED NOT DETECTED Final   Parainfluenza Virus 4 NOT DETECTED NOT DETECTED Final   Respiratory Syncytial Virus NOT DETECTED NOT DETECTED Final   Bordetella pertussis NOT DETECTED NOT DETECTED Final   Bordetella Parapertussis NOT DETECTED NOT DETECTED Final   Chlamydophila pneumoniae NOT DETECTED NOT DETECTED Final   Mycoplasma pneumoniae NOT DETECTED NOT DETECTED Final    Comment: Performed at [facility], [place]. [address] [place], [place] [address]    Coagulation Studies: No results for input(s): "LABPROT", "INR" in the last 72 hours.  Urinalysis: Recent Labs    12/19/2021 2102  COLORURINE AMBER*  LABSPEC 1.014  PHURINE 5.0  GLUCOSEU NEGATIVE  HGBUR NEGATIVE  BILIRUBINUR NEGATIVE  KETONESUR NEGATIVE  PROTEINUR 100*  NITRITE NEGATIVE  LEUKOCYTESUR NEGATIVE      Imaging: CT CHEST WO CONTRAST  Result Date: 12/26/2021 CLINICAL DATA:  Shortness of breath.  Nondiagnostic x-ray EXAM: CT CHEST WITHOUT CONTRAST TECHNIQUE: Multidetector CT imaging of the chest was performed following the standard protocol without IV contrast. RADIATION DOSE REDUCTION: This exam was performed according to the departmental dose-optimization program which includes automated exposure control, adjustment of the mA and/or kV according to patient size and/or use of iterative reconstruction technique. COMPARISON:  Radiographs earlier today and CT 12/12/2021 and ultrasound 12/12/2021 FINDINGS: Cardiovascular: Advanced coronary artery and aortic  atherosclerotic calcification. Trace  pericardial effusion. Enlarged central pulmonary arteries in keeping with changes of pulmonary arterial hypertension Mediastinum/Nodes: Similar mediastinal shift to the right compatible with right-sided volume loss. Unremarkable thyroid and esophagus. Unchanged prominent mediastinal and right hilar lymph nodes. For example 1.2 cm subcarinal node (2/63). Lungs/Pleura: Centrilobular emphysema greatest in the upper lungs. Diffuse bronchial wall thickening and mucous plugging greatest in the left lower lobe with new infiltrates and centrilobular micro nodules. This has increased from 12/12/2021. Complete collapse of the right lower lobe with extensive airway impaction similar to prior. Slightly improved aeration of the right middle lobe. Complex large posterior basal loculated right pleural effusion with heterogenous attenuation. This may relate to proteinaceous or hemorrhagic debris or progressive pleural soft tissue in the setting of mesothelioma or carcinoma. Evaluation is limited without IV contrast. Upper Abdomen: No acute abnormality. Musculoskeletal: Degenerative changes in the thoracic spine with ankylosis the mid and lower thoracic spine. IMPRESSION: 1. Complex loculated right pleural effusion is grossly similar to chest CT and ultrasound 12/12/2021. Underlying malignancy is difficult to exclude without IV contrast. 2. Extensive airway impaction and complete collapse of the right lower lobe. Improved aeration of the right middle lobe compared to prior CT. Pneumonia is difficult to exclude. 3. New left lower lobe bronchopneumonia. 4. Coronary artery calcification. 5. Enlarged central pulmonary arteries in keeping with changes of pulmonary hypertension. 6. Indeterminate mediastinal and hilar nodes are unchanged from prior. Aortic Atherosclerosis (ICD10-I70.0) and Emphysema (ICD10-J43.9). Electronically Signed   By: Placido Sou M.D.   On: 12/13/2021 21:44     Assessment  & Plan: Mr. NEHAL SHIVES is a 88 y.o.  male with past medical conditions including hypertension, diabetes, diastolic heart failure, COPD with CPAP, hyperlipidemia, who was admitted to [facility] on 12/07/2021 for Weakness [R53.1] Acute respiratory failure with hypoxia ([place]) [J96.01] HCAP (healthcare-associated pneumonia) [J18.9] Sepsis, due to unspecified organism, unspecified whether acute organ dysfunction present ([place]) [A41.9]  Acute kidney injury with hyperkalemia on chronic kidney disease stage IV.  Baseline creatinine appears to be 2.6 with GFR 23 on 11/09/2021.  Acute kidney injury secondary to infectious process.  Chronic kidney disease likely secondary to diabetes.  Agree with IV fluids, normal saline at 75 mL/h.  We feel kidney failure will improve with treatment of underlying cause.  Potassium 5.9 on admission.  Treated with Lokelma and sodium bicarb.  Remains on daily Lokelma.  No acute need for dialysis at this time.  Continue to avoid nephrotoxic agents and therapies along with hypotension.  Recommend palliative care consult to determine goals of care.  2. Anemia of chronic kidney disease Lab Results  Component Value Date   HGB 7.7 (L) 12/27/2021    Hemoglobin remains below target.  Will continue to monitor for now.  3. Secondary Hyperparathyroidism: Presumed Lab Results  Component Value Date   CALCIUM 6.9 (L) 12/27/2021    Calcium below desired target.  Will continue to monitor bone minerals.  LOS: 2 Takira Sherrin 12/26/20234:45 PM

## 2021-12-28 ENCOUNTER — Inpatient Hospital Stay: Payer: Medicare HMO | Admitting: Oncology

## 2021-12-28 ENCOUNTER — Inpatient Hospital Stay: Payer: Medicare HMO

## 2021-12-28 DIAGNOSIS — J9621 Acute and chronic respiratory failure with hypoxia: Secondary | ICD-10-CM | POA: Diagnosis not present

## 2021-12-28 LAB — BASIC METABOLIC PANEL
Anion gap: 11 (ref 5–15)
BUN: 101 mg/dL — ABNORMAL HIGH (ref 8–23)
CO2: 23 mmol/L (ref 22–32)
Calcium: 6.5 mg/dL — ABNORMAL LOW (ref 8.9–10.3)
Chloride: 107 mmol/L (ref 98–111)
Creatinine, Ser: 4.29 mg/dL — ABNORMAL HIGH (ref 0.61–1.24)
GFR, Estimated: 13 mL/min — ABNORMAL LOW (ref >60)
Glucose, Bld: 111 mg/dL — ABNORMAL HIGH (ref 70–99)
Potassium: 4.8 mmol/L (ref 3.5–5.1)
Sodium: 141 mmol/L (ref 135–145)

## 2021-12-28 LAB — CBC
HCT: 24.6 % — ABNORMAL LOW (ref 39.0–52.0)
Hemoglobin: 7.1 g/dL — ABNORMAL LOW (ref 13.0–17.0)
MCH: 27.8 pg (ref 26.0–34.0)
MCHC: 28.9 g/dL — ABNORMAL LOW (ref 30.0–36.0)
MCV: 96.5 fL (ref 80.0–100.0)
Platelets: 190 10*3/uL (ref 150–400)
RBC: 2.55 MIL/uL — ABNORMAL LOW (ref 4.22–5.81)
RDW: 19.8 % — ABNORMAL HIGH (ref 11.5–15.5)
WBC: 15 10*3/uL — ABNORMAL HIGH (ref 4.0–10.5)
nRBC: 0.1 % (ref 0.0–0.2)

## 2021-12-28 LAB — MAGNESIUM: Magnesium: 2.8 mg/dL — ABNORMAL HIGH (ref 1.7–2.4)

## 2021-12-28 NOTE — Evaluation (Signed)
Objective Swallowing Evaluation: Type of Study: MBS-Modified Barium Swallow Study   Patient Details  Name: Arthur [name] MRN: [id number] Date of Birth: July 10, 1933  Today's Date: 12/28/2021 Time: SLP Start Time (ACUTE ONLY): 1445 -SLP Stop Time (ACUTE ONLY): 1545  SLP Time Calculation (min) (ACUTE ONLY): 60 min   Past Medical History:  Past Medical History:  Diagnosis Date   Anemia of chronic kidney failure 08/21/2018   Arthritis    lower back   CHF (congestive heart failure) (HCC)    CKD (chronic kidney disease), stage III (HCC)    COPD (chronic obstructive pulmonary disease) ([place])    Diabetes mellitus without complication ([place])    type 2   Dyspnea    when active -wears oxygen 2L via [place]   History of blood transfusion    Hyperlipidemia    Hypertension    OSA on CPAP 12/12/2021   Requires continuous at home supplemental oxygen    2L via [place]   Squamous cell carcinoma in situ 09/17/2012   Right upper nasal dorsum. SCCis arising in [place]   Squamous cell carcinoma of skin 02/03/2020   R lat cheek, EDC   Past Surgical History:  Past Surgical History:  Procedure Laterality Date   COLONOSCOPY WITH PROPOFOL N/A 06/11/2018   Procedure: COLONOSCOPY WITH PROPOFOL;  Surgeon: Jonathon Bellows, MD;  Location: [facility] ENDOSCOPY;  Service: Gastroenterology;  Laterality: N/A;   COLONOSCOPY WITH PROPOFOL N/A 06/13/2018   Procedure: COLONOSCOPY WITH PROPOFOL;  Surgeon: Jonathon Bellows, MD;  Location: [facility] ENDOSCOPY;  Service: Gastroenterology;  Laterality: N/A;   ESOPHAGOGASTRODUODENOSCOPY Left 06/10/2018   Procedure: ESOPHAGOGASTRODUODENOSCOPY (EGD);  Surgeon: Jonathon Bellows, MD;  Location: [facility] ENDOSCOPY;  Service: Gastroenterology;  Laterality: Left;   EYE SURGERY     removed cataracts   GIVENS CAPSULE STUDY N/A 06/28/2018   Procedure: GIVENS CAPSULE STUDY;  Surgeon: Lucilla Lame, MD;  Location: [facility] ENDOSCOPY;  Service: Endoscopy;  Laterality: N/A;   GIVENS CAPSULE STUDY N/A 06/30/2018   Procedure: GIVENS  CAPSULE STUDY;  Surgeon: Jonathon Bellows, MD;  Location: [facility] ENDOSCOPY;  Service: Gastroenterology;  Laterality: N/A;   KYPHOPLASTY N/A 10/31/2018   Procedure: KYPHOPLASTY L2;  Surgeon: [name], MD;  Location: [facility] ORS;  Service: Orthopedics;  Laterality: N/A;   KYPHOPLASTY N/A 12/24/2018   Procedure: KYPHOPLASTY L3;  Surgeon: [name], MD;  Location: [facility] ORS;  Service: Orthopedics;  Laterality: N/A;   KYPHOPLASTY N/A 02/18/2019   Procedure: L2 KYPHOPLASTY;  Surgeon: [name], MD;  Location: [facility] ORS;  Service: Orthopedics;  Laterality: N/A;   RADIOLOGY WITH ANESTHESIA N/A 01/28/2019   Procedure: MRI LUMBER SPINE WITHOUT CONTRAST;  Surgeon: Radiologist, Medication, MD;  Location: [place];  Service: Radiology;  Laterality: N/A;   HPI: Pt is a 88 y.o.  male with past medical conditions including hypertension, diabetes, diastolic heart failure, REFLUX w/ regurgitation reported by pt, COPD with CPAP, hyperlipidemia, who was admitted to [facility] on 01/01/2022 for Weakness, Acute respiratory failure with hypoxia, HCAP (healthcare-associated pneumonia), Sepsis, due to unspecified organism, unspecified whether acute organ dysfunction.  Pt was recently admitted 12/12/21 for suspected aspiration pneumonia, Covid -19 positive (12/12/21).   CT of Chest this admit: Complex loculated right pleural effusion is grossly similar to  chest CT and ultrasound 12/12/2021. Underlying malignancy is  difficult to exclude without IV contrast.  2. Extensive airway impaction and complete collapse of the right  lower lobe. Improved aeration of the right middle lobe compared to  prior CT. Pneumonia is difficult to exclude.  3.  New left lower lobe bronchopneumonia.   Subjective: pt awake, distracted and appeared Cognitively uncomfortable at times. He yelled out w/ any movements during transition to swallow chair. Unsure of his Baseline Cognitive functioning.    Recommendations for follow up therapy are one component of a  multi-disciplinary discharge planning process, led by the attending physician.  Recommendations may be updated based on patient status, additional functional criteria and insurance authorization.  Assessment / Plan / Recommendation     12/28/2021    6:00 PM  Clinical Impressions  Clinical Impression MBSS revealed severe oropharyngeal phase dysphagia w/ significantly delayed pharyngeal swallow initiation and SILENT aspiration of bolus consistencies as well as SILENT aspiration of the pharyngeal residue left b/t trials. Pt was not able to follow instructions nor strategies consistently to compensate. MOD+ Oral phase deficits present. Overall decreased attention and awareness to swallowing noted. MOD++ verbal/tactile cues necessary.  (Trials given according to the MBSimp protocol.)  Recommend strict NPO w/ discussion of alternative means of feeding, if an option for him. Palliative Care f/u for [place] discussion. Frequent oral care for hygiene and stimulation of swallowing.   SLP Visit Diagnosis Dysphagia, oropharyngeal phase (R13.12)  Impact on safety and function Severe aspiration risk;Risk for inadequate nutrition/hydration         12/28/2021    6:00 PM  Treatment Recommendations  Treatment Recommendations Defer treatment plan to f/u with SLP        12/28/2021    6:00 PM  Prognosis  Prognosis for Safe Diet Advancement Guarded  Barriers to Reach Goals Cognitive deficits;Language deficits;Time post onset;Severity of deficits;Behavior  Barriers/Prognosis Comment altered mental status/Cognition       12/28/2021    6:00 PM  Diet Recommendations  SLP Diet Recommendations NPO  Medication Administration Via alternative means         12/28/2021    6:00 PM  Other Recommendations  Recommended Consults --  Oral Care Recommendations Oral care QID;Staff/trained caregiver to provide oral care  Follow Up Recommendations Follow physician's recommendations for discharge plan and follow up  therapies  Functional Status Assessment Patient has had a recent decline in their functional status and/or demonstrates limited ability to make significant improvements in function in a reasonable and predictable amount of time       12/28/2021    6:00 PM  Frequency and Duration   Speech Therapy Frequency (ACUTE ONLY) -- TBD at next venue of care per [place]  Treatment Duration -- TBD at next venue of care per [facility]         12/28/2021    6:00 PM  Oral Phase  Oral Phase Impaired - decreased bolus control; weak lingual/oral musculature; diffuse oral residue post swallow; oral holding of increased texture trial(cookie w/ poor mastication effort)       12/28/2021    6:00 PM  Pharyngeal Phase  Pharyngeal Phase Impaired - delayed pharyngeal swallow intiation w/ trials spilling into the pyriform sinuses; decreased pharyngeal pressure and laryngeal excursion resulting in MOD+ diffuse pharyngeal residue; BOT residue. Laryngeal Penetration and Aspiration of thin and nectar liquids; no immediate penetration/aspiration of puree/cookie.   Penetration and Aspiration were SILENT. Pt exhibited a much delayed, weak congested cough.         12/28/2021    6:00 PM  Cervical Esophageal Phase   Cervical Esophageal Phase WFL - in viewable cervical area           Orinda Kenner, [place], [facility]; [place] [phone]  ascom) Tayo Maute 12/28/2021, 6:23 PM

## 2021-12-28 NOTE — Progress Notes (Signed)
[facility]  ROUNDING NOTE   Subjective:   Patient seen resting quietly Responsive to painful stimuli No lower extremity edema IV fluids infusing  Objective:  Vital signs in last 24 hours:  Temp:  [97.7 F (36.5 C)-98.1 F (36.7 C)] 97.7 F (36.5 C) (12/27 0911) Pulse Rate:  [73-81] 75 (12/27 0911) Resp:  [14-20] 20 (12/27 0911) BP: (122-139)/(33-73) 125/36 (12/27 0911) SpO2:  [97 %-100 %] 99 % (12/27 0911) Weight:  [79.2 kg] 79.2 kg (12/27 0424)  Weight change: 1.045 kg Filed Weights   12/26/2021 1327 12/27/21 0502 12/28/21 0424  Weight: 72 kg 78.2 kg 79.2 kg    Intake/Output: I/O last 3 completed shifts: In: 2057.6 [I.V.:1637.6; IV Piggyback:420] Out: 550 [Urine:550]   Intake/Output this shift:  No intake/output data recorded.  Physical Exam: General: NAD, ill-appearing  Head: Normocephalic, atraumatic.  Dry oral mucosal membranes  Eyes: Anicteric  Lungs:  Clear to auscultation, normal effort  Heart: Regular rate and rhythm  Abdomen:  Soft, nontender  Extremities: No peripheral edema.  Neurologic: Nonfocal, moving all four extremities  Skin: No lesions  Access: None    Basic Metabolic Panel: Recent Labs  Lab 12/22/21 0506 12/23/21 0341 12/28/2021 1825 12/31/2021 2312 12/26/21 0450 12/27/21 0402 12/28/21 0456  NA 138 137 137 138 141 137 141  K 5.5* 5.0 5.9* 5.9* 4.7 5.3* 4.8  CL 103 102 102 105 104 102 107  CO2 '29 29 27 27 26 '$ 21* 23  GLUCOSE 109* 78 183* 178* 156* 193* 111*  BUN 66* 66* 81* 85* 87* 98* 101*  CREATININE 2.16* 2.01* 2.80* 2.88*  2.90* 3.04* 3.95* 4.29*  CALCIUM 7.7* 7.6* 7.6* 7.2* 7.7* 6.9* 6.5*  MG 2.5* 2.3  --   --   --  2.8* 2.8*    Liver Function Tests: Recent Labs  Lab 12/24/2021 1825 12/26/21 0450  AST 16 18  ALT 14 11  ALKPHOS 71 62  BILITOT 1.6* 1.3*  PROT 6.1* 5.9*  ALBUMIN 2.9* 2.8*   No results for input(s): "LIPASE", "AMYLASE" in the last 168 hours. No results for input(s): "AMMONIA" in the last 168  hours.  CBC: Recent Labs  Lab 12/23/2021 1825 12/23/2021 1901 12/26/21 0450 12/27/21 0402 12/28/21 0456  WBC 42.1* 39.2* 37.0* 30.5* 15.0*  NEUTROABS  --  35.8*  --   --   --   HGB 9.1* 8.4* 8.7* 7.7* 7.1*  HCT 31.4* 28.7* 29.5* 26.4* 24.6*  MCV 95.4 96.0 95.8 95.0 96.5  PLT 260 244 257 199 190    Cardiac Enzymes: No results for input(s): "CKTOTAL", "CKMB", "CKMBINDEX", "TROPONINI" in the last 168 hours.  BNP: Invalid input(s): "POCBNP"  CBG: Recent Labs  Lab 12/22/21 1158 12/22/21 1708 12/22/21 2153 12/23/21 0913 12/23/21 1219  GLUCAP 148* 129* 113* 76 145*    Microbiology: Results for orders placed or performed during the hospital encounter of 12/08/2021  Blood Culture (routine x 2)     Status: None (Preliminary result)   Collection Time: 12/04/2021  9:00 PM   Specimen: BLOOD  Result Value Ref Range Status   Specimen Description BLOOD BLOOD LEFT ARM  Final   Special Requests   Final    BOTTLES DRAWN AEROBIC AND ANAEROBIC Blood Culture adequate volume   Culture   Final    NO GROWTH 3 DAYS Performed at [facility], [address] [place], [place] [address]    Report Status PENDING  Incomplete  Resp panel by RT-PCR (RSV, Flu A&B, Covid) Anterior Nasal  Swab     Status: Abnormal   Collection Time: 12/02/2021  9:02 PM   Specimen: Anterior Nasal Swab  Result Value Ref Range Status   SARS Coronavirus 2 by RT PCR POSITIVE (A) NEGATIVE Final    Comment: (NOTE) SARS-CoV-2 target nucleic acids are DETECTED.  The SARS-CoV-2 RNA is generally detectable in upper respiratory specimens during the acute phase of infection. Positive results are indicative of the presence of the identified virus, but do not rule out bacterial infection or co-infection with other pathogens not detected by the test. Clinical correlation with patient history and other diagnostic information is necessary to determine patient infection status. The expected result is Negative.  Fact Sheet  for Patients: EntrepreneurPulse.com.au  Fact Sheet for Healthcare Providers: IncredibleEmployment.be  This test is not yet approved or cleared by the [place] FDA and  has been authorized for detection and/or diagnosis of SARS-CoV-2 by FDA under an Emergency Use Authorization (EUA).  This EUA will remain in effect (meaning this test can be used) for the duration of  the COVID-19 declaration under Section 564(b)(1) of the A ct, 21 U.S.C. section 360bbb-3(b)(1), unless the authorization is terminated or revoked sooner.     Influenza A by PCR NEGATIVE NEGATIVE Final   Influenza B by PCR NEGATIVE NEGATIVE Final    Comment: (NOTE) The Xpert Xpress SARS-CoV-2/FLU/RSV plus assay is intended as an aid in the diagnosis of influenza from Nasopharyngeal swab specimens and should not be used as a sole basis for treatment. Nasal washings and aspirates are unacceptable for Xpert Xpress SARS-CoV-2/FLU/RSV testing.  Fact Sheet for Patients: EntrepreneurPulse.com.au  Fact Sheet for Healthcare Providers: IncredibleEmployment.be  This test is not yet approved or cleared by the [place] FDA and has been authorized for detection and/or diagnosis of SARS-CoV-2 by FDA under an Emergency Use Authorization (EUA). This EUA will remain in effect (meaning this test can be used) for the duration of the COVID-19 declaration under Section 564(b)(1) of the Act, 21 U.S.C. section 360bbb-3(b)(1), unless the authorization is terminated or revoked.     Resp Syncytial Virus by PCR NEGATIVE NEGATIVE Final    Comment: (NOTE) Fact Sheet for Patients: EntrepreneurPulse.com.au  Fact Sheet for Healthcare Providers: IncredibleEmployment.be  This test is not yet approved or cleared by the [place] FDA and has been authorized for detection and/or diagnosis of SARS-CoV-2 by FDA under an Emergency  Use Authorization (EUA). This EUA will remain in effect (meaning this test can be used) for the duration of the COVID-19 declaration under Section 564(b)(1) of the Act, 21 U.S.C. section 360bbb-3(b)(1), unless the authorization is terminated or revoked.  Performed at [facility], [address] [place], [place] [address]   Blood Culture (routine x 2)     Status: None (Preliminary result)   Collection Time: 12/27/2021  9:02 PM   Specimen: BLOOD  Result Value Ref Range Status   Specimen Description BLOOD BLOOD LEFT ARM  Final   Special Requests   Final    BOTTLES DRAWN AEROBIC AND ANAEROBIC Blood Culture adequate volume   Culture   Final    NO GROWTH 3 DAYS Performed at [facility], [address] [place], [place] [address]    Report Status PENDING  Incomplete  Urine Culture     Status: None   Collection Time: 12/03/2021  9:02 PM   Specimen: In/Out Cath Urine  Result Value Ref Range Status   Specimen Description   Final    IN/OUT CATH URINE Performed  at [facility], [address] [place], [place] [address]    Special Requests   Final    NONE Performed at [facility], [address], [place], [place] [address]    Culture   Final    NO GROWTH Performed at [facility], [place] [address], [place], [place] [address]    Report Status 12/27/2021 FINAL  Final  MRSA Next Gen by PCR, Nasal     Status: None   Collection Time: 12/14/2021 11:12 PM   Specimen: Nasal Mucosa; Nasal Swab  Result Value Ref Range Status   MRSA by PCR Next Gen NOT DETECTED NOT DETECTED Final    Comment: (NOTE) The GeneXpert MRSA Assay (FDA approved for NASAL specimens only), is one component of a comprehensive MRSA colonization surveillance program. It is not intended to diagnose MRSA infection nor to guide or monitor treatment for MRSA infections. Test performance is not FDA approved in patients less than 53 years old. Performed at [facility], [place], [place] [address]   Respiratory (~20 pathogens) panel by PCR     Status: None   Collection Time: 12/26/21  7:48 PM   Specimen: Nasopharyngeal Swab; Respiratory  Result Value Ref Range Status   Adenovirus NOT DETECTED NOT DETECTED Final   Coronavirus 229E NOT DETECTED NOT DETECTED Final    Comment: (NOTE) The Coronavirus on the Respiratory Panel, DOES NOT test for the novel  Coronavirus (2019 nCoV)    Coronavirus HKU1 NOT DETECTED NOT DETECTED Final   Coronavirus NL63 NOT DETECTED NOT DETECTED Final   Coronavirus OC43 NOT DETECTED NOT DETECTED Final   Metapneumovirus NOT DETECTED NOT DETECTED Final   Rhinovirus / Enterovirus NOT DETECTED NOT DETECTED Final   Influenza A NOT DETECTED NOT DETECTED Final   Influenza B NOT DETECTED NOT DETECTED Final   Parainfluenza Virus 1 NOT DETECTED NOT DETECTED Final   Parainfluenza Virus 2 NOT DETECTED NOT DETECTED Final   Parainfluenza Virus 3 NOT DETECTED NOT DETECTED Final   Parainfluenza Virus 4 NOT DETECTED NOT DETECTED Final   Respiratory Syncytial Virus NOT DETECTED NOT DETECTED Final   Bordetella pertussis NOT DETECTED NOT DETECTED Final   Bordetella Parapertussis NOT DETECTED NOT DETECTED Final   Chlamydophila pneumoniae NOT DETECTED NOT DETECTED Final   Mycoplasma pneumoniae NOT DETECTED NOT DETECTED Final    Comment: Performed at [facility], [place]. [address] [place], [place] [address]    Coagulation Studies: No results for input(s): "LABPROT", "INR" in the last 72 hours.  Urinalysis: Recent Labs    12/06/2021 2102  COLORURINE AMBER*  LABSPEC 1.014  PHURINE 5.0  GLUCOSEU NEGATIVE  HGBUR NEGATIVE  BILIRUBINUR NEGATIVE  KETONESUR NEGATIVE  PROTEINUR 100*  NITRITE NEGATIVE  LEUKOCYTESUR NEGATIVE      Imaging: No results found.   Medications:    azithromycin Stopped (12/27/21 2343)   ceFEPime (MAXIPIME) IV Stopped (12/27/21 2054)    acetylcysteine  4 mL Nebulization BID   amLODipine  10 mg  Oral Daily   heparin  5,000 Units Subcutaneous Q8H   ipratropium-albuterol  3 mL Nebulization BID   ondansetron (ZOFRAN) IV  4 mg Intravenous Once   pravastatin  10 mg Oral QHS   sodium zirconium cyclosilicate  10 g Oral BID   tamsulosin  0.4 mg Oral Daily   tiotropium  18 mcg Inhalation Daily   acetaminophen **OR** acetaminophen, hydrALAZINE, ondansetron (ZOFRAN) IV  Assessment/ Plan:  [name] is a  88 y.o.  male with past medical conditions including hypertension, diabetes, diastolic heart failure, COPD with CPAP, hyperlipidemia, who was admitted to [facility] on 12/03/2021 for Weakness [R53.1] Acute respiratory failure with hypoxia (HCC) [J96.01] HCAP (healthcare-associated pneumonia) [J18.9] Sepsis, due to unspecified organism, unspecified whether acute organ dysfunction present ([place]) [A41.9]   Acute kidney injury with hyperkalemia on chronic kidney disease stage IV.  Baseline creatinine appears to be 2.6 with GFR 23 on 11/09/2021.  Acute kidney injury secondary to infectious process.  Chronic kidney disease likely secondary to diabetes.  Renal function continues to worsen despite IV fluids.  Urine output slightly improved however below desired target.  No acute need for dialysis at this time.  Continue to encourage palliative care consult to discuss goals of care.  Lab Results  Component Value Date   CREATININE 4.29 (H) 12/28/2021   CREATININE 3.95 (H) 12/27/2021   CREATININE 3.04 (H) 12/26/2021    Intake/Output Summary (Last 24 hours) at 12/28/2021 1502 Last data filed at 12/28/2021 0412 Gross per 24 hour  Intake 1449.49 ml  Output 550 ml  Net 899.49 ml   2. Anemia of chronic kidney disease Lab Results  Component Value Date   HGB 7.1 (L) 12/28/2021  Hemoglobin below desired target.  Will defer need for blood transfusion to primary team.  3. Secondary Hyperparathyroidism: Presumed Lab Results  Component Value Date   CALCIUM 6.5 (L) 12/28/2021    Calcium remains  decreased.  Likely due to poor nutritional status.   LOS: 3 Arthur [name] 12/27/20233:02 PM

## 2021-12-28 NOTE — Progress Notes (Signed)
Initial Nutrition Assessment  DOCUMENTATION CODES:   Not applicable  INTERVENTION:   RD will monitor for GOC vs the need for nutrition support.   Pt at high refeed risk  NUTRITION DIAGNOSIS:   Inadequate oral intake related to dysphagia as evidenced by NPO status.  GOAL:   Patient will meet greater than or equal to 90% of their needs  MONITOR:   Diet advancement, Labs, Weight trends, I & O's, Skin  REASON FOR ASSESSMENT:   Consult Assessment of nutrition requirement/status  ASSESSMENT:   88 y/o male with h/o HTN, HLD, DM, CHF, COPD, OSA, CKD III, BPH and recent COVID 19 with aspiration PNA and who is now admitted with pleural effusion, PNA and aspiration.  Visited pt's room today. Pt is non-verbal and unable to provide any history. Pt with recurrent aspiration. Pt s/p MBSS today and was found to be at high aspiration risk. Pt remains NPO. Palliative care consult is pending. RD will monitor for GOC vs the need for nutrition support. Pt is at high refeed risk. Per chart, pt appears weight stable at baseline.   Medications reviewed and include: heparin, zofran, lokelma, azithromycin, cefepime   Labs reviewed: K 4.8 wnl, BUN 101(H), creat 4.29(H), Ca 6.5(L), Mg 2.8(H) Wbc- 15.0(H), Hgb 7.1(L), Hct 24.6(L)  NUTRITION - FOCUSED PHYSICAL EXAM:  Flowsheet Row Most Recent Value  Orbital Region No depletion  Upper Arm Region No depletion  Thoracic and Lumbar Region No depletion  Buccal Region Mild depletion  Temple Region Mild depletion  Clavicle Bone Region Moderate depletion  Clavicle and Acromion Bone Region Moderate depletion  Scapular Bone Region Mild depletion  Dorsal Hand Mild depletion  Patellar Region Mild depletion  Anterior Thigh Region Mild depletion  Posterior Calf Region Mild depletion  Edema (RD Assessment) None  Hair Reviewed  Eyes Reviewed  Mouth Reviewed  Skin Reviewed  Nails Reviewed   Diet Order:   Diet Order             Diet NPO time  specified  Diet effective now                  EDUCATION NEEDS:   No education needs have been identified at this time  Skin:  Skin Assessment: Reviewed RN Assessment (ecchymosis)  Last BM:  12/26- type 7  Height:   Ht Readings from Last 1 Encounters:  12/24/2021 '5\' 5"'$  (1.651 m)    Weight:   Wt Readings from Last 1 Encounters:  12/28/21 79.2 kg    Ideal Body Weight:  61.9 kg  BMI:  Body mass index is 29.06 kg/m.  Estimated Nutritional Needs:   Kcal:  1700-2000kcal/day  Protein:  85-100g/day  Fluid:  1.7-1.9L/day  Koleen Distance MS, RD, LDN Please refer to [facility] for RD and/or RD on-call/weekend/after hours pager

## 2021-12-28 NOTE — Plan of Care (Signed)
Patient is currently off unit and in procedure. Will follow up tomorrow with patient and family.

## 2021-12-28 NOTE — Progress Notes (Addendum)
Pharmacy Antibiotic Note  Arthur [name] is a 88 y.o. male admitted on 12/16/2021 with pneumonia.  Pharmacy has been consulted for Cefepime dosing.  Assessment: 88 yo M with PMH HTN, T2DM, CKD4 (BL Scr 2.6), anemia (BL Hgb 8), COPD, OSA, dCHF presents with hypoxia and AKI. Pt is COVID(+) and chest CT shows new LLL bronchopneumonia. Recent admission from 12/12-12/22 for COVID and pneumonia treatment. Pt is afebrile, VSS on 2 L [place], with leukocytosis.  Plan: Day # 4 of antibiotics Continue cefepime 2 g IV q24H Follow up culture results to assess for antibiotic optimization Monitor renal function to assess for any necessary antibiotic dosing changes  Continue azithromycin 500 mg IV q24H through 12/28 as part of the above plan.  Height: '5\' 5"'$  (165.1 cm) Weight: 79.2 kg (174 lb 9.7 oz) IBW/kg (Calculated) : 61.5  Temp (24hrs), Avg:98 F (36.7 C), Min:97.9 F (36.6 C), Max:98.1 F (36.7 C)  Recent Labs  Lab 12/26/2021 1825 12/28/2021 1901 12/19/2021 2102 12/12/2021 2312 12/26/21 0450 12/27/21 0402 12/28/21 0456  WBC 42.1* 39.2*  --   --  37.0* 30.5* 15.0*  CREATININE 2.80*  --   --  2.88*  2.90* 3.04* 3.95* 4.29*  LATICACIDVEN  --   --  1.2 1.3  --   --   --      Estimated Creatinine Clearance: 12 mL/min (A) (by C-G formula based on SCr of 4.29 mg/dL (H)).    Allergies  Allergen Reactions   Codeine Other (See Comments)    Constipation    Antimicrobials this admission: Vancomycin 12/24 >> 12/24 Cefepime 12/24  >>  Azithromycin 12/24 >>  Dose adjustments this admission: N/A  Microbiology results: 12/24 BCx: NG2D 12/24 UCx: NGF 12/24 MRSA PCR: Negative 12/24 COVID PCR: Positive 12/24 RVP: Negative  Thank you for allowing pharmacy to be a part of this patient's care.  Will M. Ouida Sills, PharmD PGY-1 Pharmacy Resident 12/28/2021 8:28 AM

## 2021-12-29 DIAGNOSIS — Z7189 Other specified counseling: Secondary | ICD-10-CM

## 2021-12-29 DIAGNOSIS — J9621 Acute and chronic respiratory failure with hypoxia: Secondary | ICD-10-CM | POA: Diagnosis not present

## 2021-12-29 DIAGNOSIS — Z515 Encounter for palliative care: Secondary | ICD-10-CM

## 2021-12-29 LAB — BLOOD CULTURE ID PANEL (REFLEXED) - BCID2

## 2021-12-29 LAB — CBC
HCT: 22 % — ABNORMAL LOW (ref 39.0–52.0)
Hemoglobin: 6.4 g/dL — ABNORMAL LOW (ref 13.0–17.0)
MCH: 28.2 pg (ref 26.0–34.0)
MCHC: 29.1 g/dL — ABNORMAL LOW (ref 30.0–36.0)
MCV: 96.9 fL (ref 80.0–100.0)
Platelets: 166 10*3/uL (ref 150–400)
RBC: 2.27 MIL/uL — ABNORMAL LOW (ref 4.22–5.81)
RDW: 20 % — ABNORMAL HIGH (ref 11.5–15.5)
WBC: 5.6 10*3/uL (ref 4.0–10.5)
nRBC: 0 % (ref 0.0–0.2)

## 2021-12-29 LAB — BASIC METABOLIC PANEL
Anion gap: 11 (ref 5–15)
BUN: 99 mg/dL — ABNORMAL HIGH (ref 8–23)
CO2: 21 mmol/L — ABNORMAL LOW (ref 22–32)
Calcium: 6.7 mg/dL — ABNORMAL LOW (ref 8.9–10.3)
Chloride: 115 mmol/L — ABNORMAL HIGH (ref 98–111)
Creatinine, Ser: 4.15 mg/dL — ABNORMAL HIGH (ref 0.61–1.24)
GFR, Estimated: 13 mL/min — ABNORMAL LOW (ref >60)
Glucose, Bld: 101 mg/dL — ABNORMAL HIGH (ref 70–99)
Potassium: 4.5 mmol/L (ref 3.5–5.1)
Sodium: 147 mmol/L — ABNORMAL HIGH (ref 135–145)

## 2021-12-29 LAB — GLUCOSE, CAPILLARY: Glucose-Capillary: 99 mg/dL (ref 70–99)

## 2021-12-29 LAB — MAGNESIUM: Magnesium: 3 mg/dL — ABNORMAL HIGH (ref 1.7–2.4)

## 2021-12-29 MED ORDER — LORAZEPAM 2 MG/ML IJ SOLN
1.0000 mg | INTRAMUSCULAR | Status: DC | PRN
  Administered 2021-12-29: 1 mg via INTRAVENOUS
  Filled 2021-12-29: qty 1

## 2021-12-29 MED ORDER — GLYCOPYRROLATE 0.2 MG/ML IJ SOLN
0.2000 mg | INTRAMUSCULAR | Status: DC | PRN
  Filled 2021-12-29: qty 1

## 2021-12-29 MED ORDER — IPRATROPIUM-ALBUTEROL 0.5-2.5 (3) MG/3ML IN SOLN: 3.0000 mL | RESPIRATORY_TRACT | Status: DC | PRN

## 2021-12-29 MED ORDER — DEXTROSE 5 % IV SOLN: INTRAVENOUS | Status: DC

## 2021-12-29 MED ORDER — HYDROMORPHONE HCL 1 MG/ML IJ SOLN: 0.5000 mg | INTRAMUSCULAR | Status: DC | PRN

## 2021-12-30 LAB — CULTURE, BLOOD (ROUTINE X 2)
Culture: NO GROWTH
Special Requests: ADEQUATE

## 2022-01-01 LAB — CULTURE, BLOOD (ROUTINE X 2): Special Requests: ADEQUATE

## 2022-01-02 NOTE — Plan of Care (Signed)
  Problem: Education: Goal: Knowledge of risk factors and measures for prevention of condition will improve Outcome: Not Progressing   Problem: Respiratory: Goal: Will maintain a patent airway Outcome: Progressing Goal: Complications related to the disease process, condition or treatment will be avoided or minimized Outcome: Progressing   Problem: Activity: Goal: Ability to tolerate increased activity will improve Outcome: Not Progressing   Problem: Clinical Measurements: Goal: Ability to maintain a body temperature in the normal range will improve Outcome: Progressing   Problem: Respiratory: Goal: Ability to maintain adequate ventilation will improve Outcome: Progressing Goal: Ability to maintain a clear airway will improve Outcome: Progressing   Problem: Education: Goal: Knowledge of General Education information will improve Description: Including pain rating scale, medication(s)/side effects and non-pharmacologic comfort measures Outcome: Progressing   Problem: Health Behavior/Discharge Planning: Goal: Ability to manage health-related needs will improve Outcome: Not Progressing   Problem: Clinical Measurements: Goal: Ability to maintain clinical measurements within normal limits will improve Outcome: Progressing Goal: Will remain free from infection Outcome: Progressing Goal: Diagnostic test results will improve Outcome: Progressing Goal: Respiratory complications will improve Outcome: Progressing Goal: Cardiovascular complication will be avoided Outcome: Progressing   Problem: Activity: Goal: Risk for activity intolerance will decrease Outcome: Not Progressing   Problem: Nutrition: Goal: Adequate nutrition will be maintained Outcome: Not Progressing   Problem: Coping: Goal: Level of anxiety will decrease Outcome: Progressing   Problem: Pain Managment: Goal: General experience of comfort will improve Outcome: Progressing   Problem: Safety: Goal:  Ability to remain free from injury will improve Outcome: Progressing   Problem: Skin Integrity: Goal: Risk for impaired skin integrity will decrease Outcome: Progressing

## 2022-01-02 NOTE — TOC Initial Note (Signed)
Transition of Care [facility]) - Initial/Assessment Note    Patient Details  Name: Arthur [name] MRN: [id number] Date of Birth: 01-14-1933  [facility]) CM/SW Contact:    Beverly Sessions, RN Phone Number: 2022/01/21, 3:58 PM  Clinical Narrative:                      Patient admitted from [facility]. Per Palliative note comfort care with anticipated hospital death.  Please consult TOC should needs arise or hospice placement indicated    Patient Goals and CMS Choice            Expected Discharge Plan and Services                                              Prior Living Arrangements/Services                       Activities of Daily Living Home Assistive Devices/Equipment: Environmental consultant (specify type), Wheelchair, Dentures (specify type) ADL Screening (condition at time of admission) Patient's cognitive ability adequate to safely complete daily activities?: Yes Is the patient deaf or have difficulty hearing?: No Does the patient have difficulty seeing, even when wearing glasses/contacts?: No Does the patient have difficulty concentrating, remembering, or making decisions?: Yes Patient able to express need for assistance with ADLs?: Yes Does the patient have difficulty dressing or bathing?: Yes Independently performs ADLs?: No Communication: Independent Dressing (OT): Needs assistance Is this a change from baseline?: Pre-admission baseline Grooming: Needs assistance Is this a change from baseline?: Pre-admission baseline Feeding: Independent Bathing: Needs assistance Is this a change from baseline?: Pre-admission baseline Toileting: Needs assistance Is this a change from baseline?: Pre-admission baseline In/Out Bed: Needs assistance Is this a change from baseline?: Pre-admission baseline Walks in Home: Needs assistance Is this a change from baseline?: Pre-admission baseline Does the patient have difficulty walking or climbing  stairs?: Yes Weakness of Legs: Both Weakness of Arms/Hands: None  Permission Sought/Granted                  Emotional Assessment              Admission diagnosis:  Weakness [R53.1] Acute respiratory failure with hypoxia ([place]) [J96.01] HCAP (healthcare-associated pneumonia) [J18.9] Sepsis, due to unspecified organism, unspecified whether acute organ dysfunction present [facility]) [A41.9] Patient Active Problem List   Diagnosis Date Noted   HCAP (healthcare-associated pneumonia) 12/27/2021   Acute respiratory failure with hypoxia ([place]) 12/13/2021   Hyperkalemia 12/16/2021   Protein-calorie malnutrition, severe ([place]) 12/26/2021   Leukocytosis 12/07/2021   Elevated troponin 12/08/2021   Recurrent right pleural effusion 12/12/2021   Aspiration pneumonia ([place]) 12/12/2021   OSA on CPAP 12/12/2021   Frailty 12/12/2021   COVID-19 virus infection 12/12/2021   Pleural effusion 12/12/2021   Elevated ferritin level 06/27/2019   Generalized weakness    Stage 3a chronic kidney disease ([place])    Constipation    DNR (do not resuscitate) 03/05/2019   Generalized abdominal pain    Gastroesophageal reflux disease without esophagitis    Status post thoracentesis    Pleural effusion on right 03/04/2019   S/P kyphoplasty 02/18/2019   Goals of care, counseling/discussion 01/16/2019   CKD (chronic kidney disease) stage 4, GFR 15-29 ml/min ([place]) 01/16/2019   CKD stage 4 due to type 2 diabetes mellitus (  [place])    Closed compression fracture of second lumbar vertebra ([place])    CKD stage 3 due to type 2 diabetes mellitus ([place])    Chronic respiratory failure with hypoxia ([facility])    Benign prostatic hyperplasia without lower urinary tract symptoms    History of anemia due to chronic kidney disease    Localized edema 10/01/2018   Anemia of chronic kidney failure 08/21/2018   UTI (urinary tract infection) 08/07/2018   Acute on chronic renal failure ([place]) 08/07/2018   HLD (hyperlipidemia) 07/24/2018    Acute on chronic respiratory failure with hypoxia ([place]) 07/06/2018   Anemia in chronic kidney disease (CKD) 06/27/2018   GI bleeding 06/09/2018   Sepsis secondary to UTI ([place]) 05/23/2018   Cellulitis of right lower extremity 05/22/2018   Long term (current) use of insulin ([employer]) 01/22/2015   Type II diabetes mellitus, uncontrolled 08/06/2013   Type II diabetes mellitus ([place]) 10/24/2006   OBESITY 10/24/2006   Essential hypertension 10/24/2006   ALLERGIC RHINITIS 10/24/2006   COPD (chronic obstructive pulmonary disease) ([place]) 10/24/2006   TOBACCO ABUSE, HX OF 10/24/2006   Personal history of nicotine dependence 10/24/2006   PCP:  Baxter Hire, MD Pharmacy:   [facility] [address] [place] [address] [place] [address] Phone: [phone] Fax: [phone]     Social Determinants of Health (SDOH) Social History: SDOH Screenings   Food Insecurity: No Food Insecurity (12/27/2021)  Housing: Low Risk  (12/27/2021)  Transportation Needs: No Transportation Needs (12/27/2021)  Utilities: Not At Risk (12/27/2021)  Depression (PHQ2-9): Low Risk  (07/19/2018)  Financial Resource Strain: Low Risk  (07/12/2018)  Physical Activity: Unknown (06/27/2018)  Stress: No Stress Concern Present (06/27/2018)  Tobacco Use: High Risk (12/15/2021)   SDOH Interventions:     Readmission Risk Interventions     No data to display

## 2022-01-02 NOTE — Progress Notes (Signed)
   2022-01-04 1300  Clinical Encounter Type  Visited With Patient and family together  Visit Type Initial;Code  Referral From Nurse  Consult/Referral To Chaplain   Chaplain responded to RR code and assisted and comforted family members.

## 2022-01-02 NOTE — Plan of Care (Signed)
  Problem: Education: Goal: Knowledge of risk factors and measures for prevention of condition will improve Outcome: Progressing   Problem: Coping: Goal: Psychosocial and spiritual needs will be supported Outcome: Progressing   Problem: Respiratory: Goal: Will maintain a patent airway Outcome: Progressing Goal: Complications related to the disease process, condition or treatment will be avoided or minimized Outcome: Progressing   Problem: Activity: Goal: Ability to tolerate increased activity will improve Outcome: Progressing   Problem: Clinical Measurements: Goal: Ability to maintain a body temperature in the normal range will improve Outcome: Progressing   Problem: Respiratory: Goal: Ability to maintain adequate ventilation will improve Outcome: Progressing Goal: Ability to maintain a clear airway will improve Outcome: Progressing   Problem: Education: Goal: Knowledge of General Education information will improve Description: Including pain rating scale, medication(s)/side effects and non-pharmacologic comfort measures Outcome: Progressing   Problem: Health Behavior/Discharge Planning: Goal: Ability to manage health-related needs will improve Outcome: Progressing   Problem: Clinical Measurements: Goal: Ability to maintain clinical measurements within normal limits will improve Outcome: Progressing Goal: Will remain free from infection Outcome: Progressing Goal: Diagnostic test results will improve Outcome: Progressing Goal: Respiratory complications will improve Outcome: Progressing Goal: Cardiovascular complication will be avoided Outcome: Progressing   Problem: Activity: Goal: Risk for activity intolerance will decrease Outcome: Progressing   Problem: Nutrition: Goal: Adequate nutrition will be maintained Outcome: Progressing   Problem: Coping: Goal: Level of anxiety will decrease Outcome: Progressing   Problem: Elimination: Goal: Will not experience  complications related to bowel motility Outcome: Progressing Goal: Will not experience complications related to urinary retention Outcome: Progressing   Problem: Pain Managment: Goal: General experience of comfort will improve Outcome: Progressing   Problem: Safety: Goal: Ability to remain free from injury will improve Outcome: Progressing   Problem: Skin Integrity: Goal: Risk for impaired skin integrity will decrease Outcome: Progressing

## 2022-01-02 NOTE — Progress Notes (Signed)
PROGRESS NOTE    Arthur [name]  ZHY:[id number] DOB: 12/17/1933 DOA: 12/04/2021 PCP: Baxter Hire, MD    Brief Narrative:   Arthur [name] is a 89 y.o. male with past medical history significant for HTN, DM2, advanced COPD/chronic hypoxic respiratory failure on 2 L nasal cannula at baseline, OSA, chronic diastolic congestive heart failure, history of complex right pleural effusion, CKD stage IV, anemia of chronic medical disease who presented to [facility] ED on 12/24 from SNF with worsening hypoxia with increased O2 requirement.  Workup in the ED notable for left lower lobe bronchopneumonia complicated by chronic complex loculated right pleural effusion.  Arthur was recently hospitalized and discharged on 12/22 after treatment for pneumonia Cova-19.  Despite supportive care, IV antibiotics Arthur has not been improving and palliative care was consulted for assistance with goals of care and medical decision making.  Even with improvement in Arthur's WBC count, he remains poorly responsive and unable to tolerate p.o. intake.  Discussion with Arthur's daughter that he would not want life-prolonging artificial means to maintain life and Arthur's family met with palliative care on 12/28 with decision to transition to comfort measures.  Assessment & Plan:   Acute on chronic hypoxic respiratory failure Left lower lobe pneumonia likely secondary to aspiration Chronic loculated complex pleural effusion Arthur [name] presenting from SNF with increasing O2 requirements, adult failure to thrive and poorly responsive.  History of dysphagia with likely recurrent aspiration event with chest x-ray findings of left sided pneumonia.  WBC count elevated on admission to 30.5.  Completed 5-day course of cefepime and azithromycin with improvement of WBC count to 5.6.  Despite aggressive treatment and improvement of infection, Arthur remains poorly responsive and decision now has been made after discussion with  palliative care to transition to comfort measures.  Acute renal failure on CKD stage IV Baseline creatinine 2.6.  Arthur presenting with a creatinine of 2.80.  Arthur was started on IV fluid hydration nephrology was consulted who assisted during hospital course.  Despite aggressive IV antibiotics, IV fluid hydration Arthur's creatinine continued to worsen.  Arthur now transitioning to comfort measures.  Severe oropharyngeal dysphagia Speech therapy consulted and followed during hospital course.  Modified barium swallow on 12/27 with high aspiration risk and recommended continue n.p.o. with alternate means.  Discussed with family Arthur would never want artificial means of feeding including feeding tube.  Now on comfort measures.  COPD Essential hypertension BPH Chronic diastolic congestive heart failure Discontinue home medications as now transitioning to comfort measures.   DVT prophylaxis: heparin injection 5,000 Units Start: 12/08/2021 2315 SCDs Start: 12/21/2021 2247    Code Status: DNR Family Communication: Updated Arthur's daughter present at bedside this morning  Disposition Plan:  Level of care: Med-Surg Status is: Inpatient Remains inpatient appropriate because: Transitioning to comfort measures, anticipate in-hospital demise    Consultants:  Nephrology Palliative care  Procedures:  Modified barium swallow  Antimicrobials:  Azithromycin 12/24 - 12/28 Cefepime 12/24 - 12/28 Vancomycin 12/24 -12/24   Subjective: Arthur seen examined bedside, resting comfortably.  Mitts on bilateral hands.  Nonverbal, poorly interactive.  Daughter at bedside, discussed overall poor prognosis.  Multiple RNs, rapid response team, chaplain present at bedside.  Met with palliative care now transitioning to comfort measures.  Unable obtain any further ROS from Arthur.  Anticipate hospital demise.  Objective: Vitals:   2022-01-06 0338 06-Jan-2022 0340 January 06, 2022 0350 01/06/2022 0746  BP: (!)  127/41  (!) 124/56 (!) 127/40  Pulse: 73   74  Resp:    20  Temp:    98.2 F (36.8 C)  TempSrc:      SpO2: 100%   92%  Weight:  80.3 kg    Height:        Intake/Output Summary (Last 24 hours) at 01-02-2022 1559 Last data filed at 01/02/2022 0805 Gross per 24 hour  Intake --  Output 1400 ml  Net -1400 ml   Filed Weights   12/27/21 0502 12/28/21 0424 January 02, 2022 0340  Weight: 78.2 kg 79.2 kg 80.3 kg    Examination:  Physical Exam: GEN: Chronically ill appearance, nonverbal, poorly interactive/nonverbal HEENT: NCAT, PERRL, EOMI, sclera clear, dry mucous membranes PULM: Coarse breath sounds bilaterally, decreased at bases, notable crackles, normal respiratory effort without accessory muscle use, on 2 L nasal cannula with SpO2 100% at rest. CV: RRR w/o M/G/R GI: abd soft, nontender, NABS MSK: no peripheral edema, moves all tremors independently    Data Reviewed: I have personally reviewed following labs and imaging studies  CBC: Recent Labs  Lab 12/14/2021 1901 12/26/21 0450 12/27/21 0402 12/28/21 0456 01/02/22 0631  WBC 39.2* 37.0* 30.5* 15.0* 5.6  NEUTROABS 35.8*  --   --   --   --   HGB 8.4* 8.7* 7.7* 7.1* 6.4*  HCT 28.7* 29.5* 26.4* 24.6* 22.0*  MCV 96.0 95.8 95.0 96.5 96.9  PLT 244 257 199 190 944   Basic Metabolic Panel: Recent Labs  Lab 12/23/21 0341 12/05/2021 1825 12/21/2021 2312 12/26/21 0450 12/27/21 0402 12/28/21 0456 January 02, 2022 0631  NA 137   < > 138 141 137 141 147*  K 5.0   < > 5.9* 4.7 5.3* 4.8 4.5  CL 102   < > 105 104 102 107 115*  CO2 29   < > 27 26 21* 23 21*  GLUCOSE 78   < > 178* 156* 193* 111* 101*  BUN 66*   < > 85* 87* 98* 101* 99*  CREATININE 2.01*   < > 2.88*  2.90* 3.04* 3.95* 4.29* 4.15*  CALCIUM 7.6*   < > 7.2* 7.7* 6.9* 6.5* 6.7*  MG 2.3  --   --   --  2.8* 2.8* 3.0*   < > = values in this interval not displayed.   GFR: Estimated Creatinine Clearance: 12.5 mL/min (A) (by C-G formula based on SCr of 4.15 mg/dL (H)). Liver  Function Tests: Recent Labs  Lab 12/16/2021 1825 12/26/21 0450  AST 16 18  ALT 14 11  ALKPHOS 71 62  BILITOT 1.6* 1.3*  PROT 6.1* 5.9*  ALBUMIN 2.9* 2.8*   No results for input(s): "LIPASE", "AMYLASE" in the last 168 hours. No results for input(s): "AMMONIA" in the last 168 hours. Coagulation Profile: No results for input(s): "INR", "PROTIME" in the last 168 hours. Cardiac Enzymes: No results for input(s): "CKTOTAL", "CKMB", "CKMBINDEX", "TROPONINI" in the last 168 hours. BNP (last 3 results) No results for input(s): "PROBNP" in the last 8760 hours. HbA1C: No results for input(s): "HGBA1C" in the last 72 hours. CBG: Recent Labs  Lab 12/22/21 1708 12/22/21 2153 12/23/21 0913 12/23/21 1219 01-02-2022 1246  GLUCAP 129* 113* 76 145* 99   Lipid Profile: No results for input(s): "CHOL", "HDL", "LDLCALC", "TRIG", "CHOLHDL", "LDLDIRECT" in the last 72 hours. Thyroid Function Tests: No results for input(s): "TSH", "T4TOTAL", "FREET4", "T3FREE", "THYROIDAB" in the last 72 hours. Anemia Panel: No results for input(s): "VITAMINB12", "FOLATE", "FERRITIN", "TIBC", "IRON", "RETICCTPCT" in the last 72 hours. Sepsis Labs: Recent Labs  Lab 12/11/2021 2102 12/31/2021  2312 12/26/21 0448  PROCALCITON  --   --  2.26  LATICACIDVEN 1.2 1.3  --     Recent Results (from the past 240 hour(s))  Blood Culture (routine x 2)     Status: None (Preliminary result)   Collection Time: 12/14/2021  9:00 PM   Specimen: BLOOD  Result Value Ref Range Status   Specimen Description BLOOD BLOOD LEFT ARM  Final   Special Requests   Final    BOTTLES DRAWN AEROBIC AND ANAEROBIC Blood Culture adequate volume   Culture   Final    NO GROWTH 4 DAYS Performed at [facility], [address], [place], [place] [address]    Report Status PENDING  Incomplete  Resp panel by RT-PCR (RSV, Flu A&B, Covid) Anterior Nasal Swab     Status: Abnormal   Collection Time: 12/31/2021  9:02 PM   Specimen: Anterior Nasal  Swab  Result Value Ref Range Status   SARS Coronavirus 2 by RT PCR POSITIVE (A) NEGATIVE Final    Comment: (NOTE) SARS-CoV-2 target nucleic acids are DETECTED.  The SARS-CoV-2 RNA is generally detectable in upper respiratory specimens during the acute phase of infection. Positive results are indicative of the presence of the identified virus, but do not rule out bacterial infection or co-infection with other pathogens not detected by the test. Clinical correlation with Arthur history and other diagnostic information is necessary to determine Arthur infection status. The expected result is Negative.  Fact Sheet for Patients: EntrepreneurPulse.com.au  Fact Sheet for Healthcare Providers: IncredibleEmployment.be  This test is not yet approved or cleared by the [place] FDA and  has been authorized for detection and/or diagnosis of SARS-CoV-2 by FDA under an Emergency Use Authorization (EUA).  This EUA will remain in effect (meaning this test can be used) for the duration of  the COVID-19 declaration under Section 564(b)(1) of the A ct, 21 U.S.C. section 360bbb-3(b)(1), unless the authorization is terminated or revoked sooner.     Influenza A by PCR NEGATIVE NEGATIVE Final   Influenza B by PCR NEGATIVE NEGATIVE Final    Comment: (NOTE) The Xpert Xpress SARS-CoV-2/FLU/RSV plus assay is intended as an aid in the diagnosis of influenza from Nasopharyngeal swab specimens and should not be used as a sole basis for treatment. Nasal washings and aspirates are unacceptable for Xpert Xpress SARS-CoV-2/FLU/RSV testing.  Fact Sheet for Patients: EntrepreneurPulse.com.au  Fact Sheet for Healthcare Providers: IncredibleEmployment.be  This test is not yet approved or cleared by the [place] FDA and has been authorized for detection and/or diagnosis of SARS-CoV-2 by FDA under an Emergency Use Authorization  (EUA). This EUA will remain in effect (meaning this test can be used) for the duration of the COVID-19 declaration under Section 564(b)(1) of the Act, 21 U.S.C. section 360bbb-3(b)(1), unless the authorization is terminated or revoked.     Resp Syncytial Virus by PCR NEGATIVE NEGATIVE Final    Comment: (NOTE) Fact Sheet for Patients: EntrepreneurPulse.com.au  Fact Sheet for Healthcare Providers: IncredibleEmployment.be  This test is not yet approved or cleared by the [place] FDA and has been authorized for detection and/or diagnosis of SARS-CoV-2 by FDA under an Emergency Use Authorization (EUA). This EUA will remain in effect (meaning this test can be used) for the duration of the COVID-19 declaration under Section 564(b)(1) of the Act, 21 U.S.C. section 360bbb-3(b)(1), unless the authorization is terminated or revoked.  Performed at [facility], [address] [place], [place] [address]   Blood Culture (  routine x 2)     Status: None (Preliminary result)   Collection Time: 12/22/2021  9:02 PM   Specimen: BLOOD  Result Value Ref Range Status   Specimen Description BLOOD BLOOD LEFT ARM  Final   Special Requests   Final    BOTTLES DRAWN AEROBIC AND ANAEROBIC Blood Culture adequate volume   Culture   Final    NO GROWTH 4 DAYS Performed at [facility], [address] [place], [place] [address]    Report Status PENDING  Incomplete  Urine Culture     Status: None   Collection Time: 12/23/2021  9:02 PM   Specimen: In/Out Cath Urine  Result Value Ref Range Status   Specimen Description   Final    IN/OUT CATH URINE Performed at [facility], [address] [place], [place] [address]    Special Requests   Final    NONE Performed at [facility], [address] [place], [place], [place] [address]    Culture   Final    NO GROWTH Performed at [facility], [place] [address], [place], [place]  [address]    Report Status 12/27/2021 FINAL  Final  MRSA Next Gen by PCR, Nasal     Status: None   Collection Time: 12/17/2021 11:12 PM   Specimen: Nasal Mucosa; Nasal Swab  Result Value Ref Range Status   MRSA by PCR Next Gen NOT DETECTED NOT DETECTED Final    Comment: (NOTE) The GeneXpert MRSA Assay (FDA approved for NASAL specimens only), is one component of a comprehensive MRSA colonization surveillance program. It is not intended to diagnose MRSA infection nor to guide or monitor treatment for MRSA infections. Test performance is not FDA approved in patients less than 40 years old. Performed at [facility], [place], [place] [address]   Respiratory (~20 pathogens) panel by PCR     Status: None   Collection Time: 12/26/21  7:48 PM   Specimen: Nasopharyngeal Swab; Respiratory  Result Value Ref Range Status   Adenovirus NOT DETECTED NOT DETECTED Final   Coronavirus 229E NOT DETECTED NOT DETECTED Final    Comment: (NOTE) The Coronavirus on the Respiratory Panel, DOES NOT test for the novel  Coronavirus (2019 nCoV)    Coronavirus HKU1 NOT DETECTED NOT DETECTED Final   Coronavirus NL63 NOT DETECTED NOT DETECTED Final   Coronavirus OC43 NOT DETECTED NOT DETECTED Final   Metapneumovirus NOT DETECTED NOT DETECTED Final   Rhinovirus / Enterovirus NOT DETECTED NOT DETECTED Final   Influenza A NOT DETECTED NOT DETECTED Final   Influenza B NOT DETECTED NOT DETECTED Final   Parainfluenza Virus 1 NOT DETECTED NOT DETECTED Final   Parainfluenza Virus 2 NOT DETECTED NOT DETECTED Final   Parainfluenza Virus 3 NOT DETECTED NOT DETECTED Final   Parainfluenza Virus 4 NOT DETECTED NOT DETECTED Final   Respiratory Syncytial Virus NOT DETECTED NOT DETECTED Final   Bordetella pertussis NOT DETECTED NOT DETECTED Final   Bordetella Parapertussis NOT DETECTED NOT DETECTED Final   Chlamydophila pneumoniae NOT DETECTED NOT DETECTED Final   Mycoplasma pneumoniae NOT DETECTED NOT  DETECTED Final    Comment: Performed at [facility], [place]. [address] [place], [place] [address]         Radiology Studies: No results found.      Scheduled Meds:  heparin  5,000 Units Subcutaneous Q8H   sodium zirconium cyclosilicate  10 g Oral BID   tamsulosin  0.4 mg Oral  Daily   tiotropium  18 mcg Inhalation Daily   Continuous Infusions:  azithromycin 500 mg (12/28/21 2227)   ceFEPime (MAXIPIME) IV 2 g (12/28/21 2136)   dextrose Stopped (01/01/2022 1522)     LOS: 4 days    Time spent: 51 minutes spent on chart review, discussion with nursing staff, consultants, updating family and interview/physical exam; more than 50% of that time was spent in counseling and/or coordination of care.    Cace Osorto J [place] DO [facility] Available via Epic secure chat 7am-7pm After these hours, please refer to coverage provider listed on [email or web address] 01-Jan-2022, 4:00 PM

## 2022-01-02 NOTE — Significant Event (Signed)
Rapid Response Event Note   Reason for Call :  Unresponsive, potential seizure activity  Initial Focused Assessment:  Rapid response RN arrived in patient's room with patient lying in bed surrounded by [facility] staff and patient's daughter. Per staff and patient's daughter, patient had been sleeping and then seemed to wake up shaking and jerking. When the shaking and jerking stopped he was unresponsive. Per family he has no history of seizures.   Patient currently has spontaneous respirations with coarse crackles. He is unresponsive. Eyes were initially open but not focused and then gradually closed. Vital signs showed BP of 138/39 MAP 67, oxygen saturations of 97% on nasal cannula  Interventions:  CBG 99.  Plan of Care:  Dr. [place] spoke with daughter at bedside who confirmed that patient was a DNR and would not want intubation. He discussed patient's current condition, progress since admission, and prognosis. Family at bedside with the patient with more family coming. Patient will remain on 2C for now. Staff will reach back out to Dr. [place] for further needs pending family discussion.  Event Summary:   MD Notified: Dr. [place] Call Time: 12:46 Arrival Time: 12:47 End Time: 12:56  Jentry Mcqueary, Jaynie Bream, RN

## 2022-01-02 NOTE — Progress Notes (Signed)
Progress Note   Patient: Arthur [name]:[id number] DOB: 10-31-1933 DOA: 12/30/2021     4 DOS: the patient was seen and examined on 01-13-2022   Brief hospital course: Arthur [name] is a 89 y.o. male with a PMH of HTN, DM2, Advanced COPD and Chronic Hypoxia on 2L [place] at baseline, OSA, Diastolic Heart Failure, complex right pleural effusion (last thoracentesis on 01/2020), CKD4 with anemia on EPO injections, recently discharged on 12/23/21 after treating for pneumonia and Covid -64 who presented from SNF rehab with hypoxia and increase O2 requirement and found to have acute on chronic hypoxic respiratory failure secondary to Left lower lobe bronchopneumonia.  Hospital course complicated by chronic complex loculated right pleural effusion which is unchanged in nature and imaging from 12/12/2021.  Patient has not been improving despite supportive care and IV antibiotics prompting recommendation for goals of care discussion with palliative care.  Patient has overall poor functioning status and Alert to self on examination with worsening AKI on CKD  Assessment and Plan:  Acute on chronic hypoxic respiratory failure secondary to new left lower lobe pneumonia, likely complicated by aspiration as well as chronic loculated pleural effusion - Continue cefepime and azithromycin - Monitor respiratory status - Monitor CBC  AKI on CKD stage IV Baseline creatinine 2.6, continuously elevated currently greater than 4 with BUN over 100.  Presumed some of worsening orientation likely related to uremia.  CKD secondary to diabetes.  Renal function is not improving despite IV fluids recommended by nephrology consult.  Hyperkalemic on admission, currently stable - Nephrology assessment no acute need for dialysis at this time - Palliative care consult to discuss goals of care with family - Daily CMP, avoid nephrotoxins, monitor output  Severe oropharyngeal phase dysphagia Evaluated by modified barium swallow as  per pathology on 12/27.  At increased risk Aspiration - Speech pathology recommends n.p.o. status - Will need to discuss with family  Complex parapneumonic pleural pain, chronic CT chest on admission shows stable characterization from previous hospitalization.  At that time patient is not a candidate for IR guided thoracentesis due to associated compressive atelectasis - Discussed with daughter at bedside  COPD centrilobular emphysema, stable - Continue bronchodilators  Chronic diastolic CHF.  Does not look hypervolemic on exam - Currently holding home diuretics in setting of AKI on CKD - Daily weights, strict I's and O's  Hypertension, stable - Continue amlodipine  BPH, stable - Continue Flomax, monitor output         Subjective: slightly moaning during exam. Able to name daughter and his name  Physical Exam: Vitals:   12/28/21 0911 12/28/21 1959 12/28/21 2003 12/28/21 2042  BP: (!) 125/36   (!) 125/42  Pulse: 75  83 83  Resp: '20  20 20  '$ Temp: 97.7 F (36.5 C)   97.7 F (36.5 C)  TempSrc:    Oral  SpO2: 99% (!) 80% 94% 95%  Weight:      Height:       Lying in bed Looks uncomfortable but in no acute distress On 2 L nasal cannula, increased rhonchi, no wheezing Dry oral mucosa Abdomen soft Data Reviewed:  There are no new results to review at this time.  Family Communication: Daughter updated at bedside  Disposition: Status is: Inpatient Remains inpatient appropriate because: Clinically still requiring IV antibiotics continues improvement and concern for poor clinical status especially light of worsening kidney function and mentation     Author: Desiree Hane, MD January 13, 2022 12:49 AM  For on call review [email or web address].

## 2022-01-02 NOTE — Progress Notes (Signed)
PHARMACY - PHYSICIAN COMMUNICATION CRITICAL VALUE ALERT - BLOOD CULTURE IDENTIFICATION (BCID)  Arthur [name] is an 89 y.o. male who presented to [facility] on 12/19/2021 with a chief complaint of pneumonia complicated by chronic loculated complex pleural effusion / acute renal failure on CKD  Assessment:  1/4 bottles (to date) with GNR. BCID detected Enterobacter cloacae complex. Suspect source is aspiration pneumonia.   Name of physician (or Provider) Contacted: Dr. [place]  Current antibiotics: Cefepime + azithromycin  Changes to prescribed antibiotics recommended:  Patient is on recommended antibiotics - No changes needed  Results for orders placed or performed during the hospital encounter of 12/10/2021  Blood Culture ID Panel (Reflexed) (Collected: 12/27/2021  9:00 PM)  Result Value Ref Range   Enterococcus faecalis NOT DETECTED NOT DETECTED   Enterococcus Faecium NOT DETECTED NOT DETECTED   Listeria monocytogenes NOT DETECTED NOT DETECTED   Staphylococcus species NOT DETECTED NOT DETECTED   Staphylococcus aureus (BCID) NOT DETECTED NOT DETECTED   Staphylococcus epidermidis NOT DETECTED NOT DETECTED   Staphylococcus lugdunensis NOT DETECTED NOT DETECTED   Streptococcus species NOT DETECTED NOT DETECTED   Streptococcus agalactiae NOT DETECTED NOT DETECTED   Streptococcus pneumoniae NOT DETECTED NOT DETECTED   Streptococcus pyogenes NOT DETECTED NOT DETECTED   A.calcoaceticus-baumannii NOT DETECTED NOT DETECTED   Bacteroides fragilis NOT DETECTED NOT DETECTED   Enterobacterales DETECTED (A) NOT DETECTED   Enterobacter cloacae complex DETECTED (A) NOT DETECTED   Escherichia coli NOT DETECTED NOT DETECTED   Klebsiella aerogenes NOT DETECTED NOT DETECTED   Klebsiella oxytoca NOT DETECTED NOT DETECTED   Klebsiella pneumoniae NOT DETECTED NOT DETECTED   Proteus species NOT DETECTED NOT DETECTED   Salmonella species NOT DETECTED NOT DETECTED   Serratia marcescens NOT DETECTED NOT  DETECTED   Haemophilus influenzae NOT DETECTED NOT DETECTED   Neisseria meningitidis NOT DETECTED NOT DETECTED   Pseudomonas aeruginosa NOT DETECTED NOT DETECTED   Stenotrophomonas maltophilia NOT DETECTED NOT DETECTED   Candida albicans NOT DETECTED NOT DETECTED   Candida auris NOT DETECTED NOT DETECTED   Candida glabrata NOT DETECTED NOT DETECTED   Candida krusei NOT DETECTED NOT DETECTED   Candida parapsilosis NOT DETECTED NOT DETECTED   Candida tropicalis NOT DETECTED NOT DETECTED   Cryptococcus neoformans/gattii NOT DETECTED NOT DETECTED   CTX-M ESBL NOT DETECTED NOT DETECTED   Carbapenem resistance IMP NOT DETECTED NOT DETECTED   Carbapenem resistance KPC NOT DETECTED NOT DETECTED   Carbapenem resistance NDM NOT DETECTED NOT DETECTED   Carbapenem resist OXA 48 LIKE NOT DETECTED NOT DETECTED   Carbapenem resistance VIM NOT DETECTED NOT DETECTED    Benita Gutter Dec 30, 2021  5:20 PM

## 2022-01-02 NOTE — Progress Notes (Addendum)
[facility]  ROUNDING NOTE   Subjective:   Patient seen resting quietly Bilateral mitts No family at bedside Response to painful stimuli only   Objective:  Vital signs in last 24 hours:  Temp:  [97.5 F (36.4 C)-98.2 F (36.8 C)] 98.2 F (36.8 C) (12/28 0746) Pulse Rate:  [73-83] 74 (12/28 0746) Resp:  [17-20] 20 (12/28 0746) BP: (124-128)/(39-56) 127/40 (12/28 0746) SpO2:  [80 %-100 %] 92 % (12/28 0746) Weight:  [80.3 kg] 80.3 kg (12/28 0340)  Weight change: 1.1 kg Filed Weights   12/27/21 0502 12/28/21 0424 01-12-22 0340  Weight: 78.2 kg 79.2 kg 80.3 kg    Intake/Output: I/O last 3 completed shifts: In: 927.6 [I.V.:577.6; IV Piggyback:350] Out: 1200 [Urine:1200]   Intake/Output this shift:  Total I/O In: -  Out: 600 [Urine:600]  Physical Exam: General: NAD, ill-appearing  Head: Normocephalic, atraumatic.  Dry oral mucosal membranes  Eyes: Anicteric  Lungs:  Clear to auscultation, normal effort  Heart: Regular rate and rhythm  Abdomen:  Soft, nontender  Extremities: No peripheral edema.  Neurologic: Nonfocal, moving all four extremities  Skin: No lesions  Access: None    Basic Metabolic Panel: Recent Labs  Lab 12/23/21 0341 12/18/2021 1825 12/10/2021 2312 12/26/21 0450 12/27/21 0402 12/28/21 0456 01-12-22 0631  NA 137   < > 138 141 137 141 147*  K 5.0   < > 5.9* 4.7 5.3* 4.8 4.5  CL 102   < > 105 104 102 107 115*  CO2 29   < > 27 26 21* 23 21*  GLUCOSE 78   < > 178* 156* 193* 111* 101*  BUN 66*   < > 85* 87* 98* 101* 99*  CREATININE 2.01*   < > 2.88*  2.90* 3.04* 3.95* 4.29* 4.15*  CALCIUM 7.6*   < > 7.2* 7.7* 6.9* 6.5* 6.7*  MG 2.3  --   --   --  2.8* 2.8* 3.0*   < > = values in this interval not displayed.     Liver Function Tests: Recent Labs  Lab 12/19/2021 1825 12/26/21 0450  AST 16 18  ALT 14 11  ALKPHOS 71 62  BILITOT 1.6* 1.3*  PROT 6.1* 5.9*  ALBUMIN 2.9* 2.8*    No results for input(s): "LIPASE", "AMYLASE" in  the last 168 hours. No results for input(s): "AMMONIA" in the last 168 hours.  CBC: Recent Labs  Lab 12/20/2021 1901 12/26/21 0450 12/27/21 0402 12/28/21 0456 01-12-22 0631  WBC 39.2* 37.0* 30.5* 15.0* 5.6  NEUTROABS 35.8*  --   --   --   --   HGB 8.4* 8.7* 7.7* 7.1* 6.4*  HCT 28.7* 29.5* 26.4* 24.6* 22.0*  MCV 96.0 95.8 95.0 96.5 96.9  PLT 244 257 199 190 166     Cardiac Enzymes: No results for input(s): "CKTOTAL", "CKMB", "CKMBINDEX", "TROPONINI" in the last 168 hours.  BNP: Invalid input(s): "POCBNP"  CBG: Recent Labs  Lab 12/22/21 1708 12/22/21 2153 12/23/21 0913 12/23/21 1219 January 12, 2022 1246  GLUCAP 129* 113* 76 145* 99     Microbiology: Results for orders placed or performed during the hospital encounter of 12/07/2021  Blood Culture (routine x 2)     Status: None (Preliminary result)   Collection Time: 12/24/2021  9:00 PM   Specimen: BLOOD  Result Value Ref Range Status   Specimen Description BLOOD BLOOD LEFT ARM  Final   Special Requests   Final    BOTTLES DRAWN AEROBIC AND ANAEROBIC Blood Culture adequate volume  Culture   Final    NO GROWTH 4 DAYS Performed at [facility], [address] [place], [place] [address]    Report Status PENDING  Incomplete  Resp panel by RT-PCR (RSV, Flu A&B, Covid) Anterior Nasal Swab     Status: Abnormal   Collection Time: 12/14/2021  9:02 PM   Specimen: Anterior Nasal Swab  Result Value Ref Range Status   SARS Coronavirus 2 by RT PCR POSITIVE (A) NEGATIVE Final    Comment: (NOTE) SARS-CoV-2 target nucleic acids are DETECTED.  The SARS-CoV-2 RNA is generally detectable in upper respiratory specimens during the acute phase of infection. Positive results are indicative of the presence of the identified virus, but do not rule out bacterial infection or co-infection with other pathogens not detected by the test. Clinical correlation with patient history and other diagnostic information is necessary to determine  patient infection status. The expected result is Negative.  Fact Sheet for Patients: EntrepreneurPulse.com.au  Fact Sheet for Healthcare Providers: IncredibleEmployment.be  This test is not yet approved or cleared by the [place] FDA and  has been authorized for detection and/or diagnosis of SARS-CoV-2 by FDA under an Emergency Use Authorization (EUA).  This EUA will remain in effect (meaning this test can be used) for the duration of  the COVID-19 declaration under Section 564(b)(1) of the A ct, 21 U.S.C. section 360bbb-3(b)(1), unless the authorization is terminated or revoked sooner.     Influenza A by PCR NEGATIVE NEGATIVE Final   Influenza B by PCR NEGATIVE NEGATIVE Final    Comment: (NOTE) The Xpert Xpress SARS-CoV-2/FLU/RSV plus assay is intended as an aid in the diagnosis of influenza from Nasopharyngeal swab specimens and should not be used as a sole basis for treatment. Nasal washings and aspirates are unacceptable for Xpert Xpress SARS-CoV-2/FLU/RSV testing.  Fact Sheet for Patients: EntrepreneurPulse.com.au  Fact Sheet for Healthcare Providers: IncredibleEmployment.be  This test is not yet approved or cleared by the [place] FDA and has been authorized for detection and/or diagnosis of SARS-CoV-2 by FDA under an Emergency Use Authorization (EUA). This EUA will remain in effect (meaning this test can be used) for the duration of the COVID-19 declaration under Section 564(b)(1) of the Act, 21 U.S.C. section 360bbb-3(b)(1), unless the authorization is terminated or revoked.     Resp Syncytial Virus by PCR NEGATIVE NEGATIVE Final    Comment: (NOTE) Fact Sheet for Patients: EntrepreneurPulse.com.au  Fact Sheet for Healthcare Providers: IncredibleEmployment.be  This test is not yet approved or cleared by the [place] FDA and has been  authorized for detection and/or diagnosis of SARS-CoV-2 by FDA under an Emergency Use Authorization (EUA). This EUA will remain in effect (meaning this test can be used) for the duration of the COVID-19 declaration under Section 564(b)(1) of the Act, 21 U.S.C. section 360bbb-3(b)(1), unless the authorization is terminated or revoked.  Performed at [facility], [address] [place], [place] [address]   Blood Culture (routine x 2)     Status: None (Preliminary result)   Collection Time: 12/22/2021  9:02 PM   Specimen: BLOOD  Result Value Ref Range Status   Specimen Description BLOOD BLOOD LEFT ARM  Final   Special Requests   Final    BOTTLES DRAWN AEROBIC AND ANAEROBIC Blood Culture adequate volume   Culture   Final    NO GROWTH 4 DAYS Performed at [facility], [address] [place], [place] [address]    Report Status PENDING  Incomplete  Urine Culture     Status: None   Collection Time: 12/08/2021  9:02 PM   Specimen: In/Out Cath Urine  Result Value Ref Range Status   Specimen Description   Final    IN/OUT CATH URINE Performed at [facility], [address] [place], [place] [address]    Special Requests   Final    NONE Performed at [facility], [address], [place], [place] [address]    Culture   Final    NO GROWTH Performed at [facility], [place] [address] [place], [place] [address]    Report Status 12/27/2021 FINAL  Final  MRSA Next Gen by PCR, Nasal     Status: None   Collection Time: 12/10/2021 11:12 PM   Specimen: Nasal Mucosa; Nasal Swab  Result Value Ref Range Status   MRSA by PCR Next Gen NOT DETECTED NOT DETECTED Final    Comment: (NOTE) The GeneXpert MRSA Assay (FDA approved for NASAL specimens only), is one component of a comprehensive MRSA colonization surveillance program. It is not intended to diagnose MRSA infection nor to guide or monitor treatment for MRSA infections. Test performance is not FDA  approved in patients less than 81 years old. Performed at [facility], [place], [place] [address]   Respiratory (~20 pathogens) panel by PCR     Status: None   Collection Time: 12/26/21  7:48 PM   Specimen: Nasopharyngeal Swab; Respiratory  Result Value Ref Range Status   Adenovirus NOT DETECTED NOT DETECTED Final   Coronavirus 229E NOT DETECTED NOT DETECTED Final    Comment: (NOTE) The Coronavirus on the Respiratory Panel, DOES NOT test for the novel  Coronavirus (2019 nCoV)    Coronavirus HKU1 NOT DETECTED NOT DETECTED Final   Coronavirus NL63 NOT DETECTED NOT DETECTED Final   Coronavirus OC43 NOT DETECTED NOT DETECTED Final   Metapneumovirus NOT DETECTED NOT DETECTED Final   Rhinovirus / Enterovirus NOT DETECTED NOT DETECTED Final   Influenza A NOT DETECTED NOT DETECTED Final   Influenza B NOT DETECTED NOT DETECTED Final   Parainfluenza Virus 1 NOT DETECTED NOT DETECTED Final   Parainfluenza Virus 2 NOT DETECTED NOT DETECTED Final   Parainfluenza Virus 3 NOT DETECTED NOT DETECTED Final   Parainfluenza Virus 4 NOT DETECTED NOT DETECTED Final   Respiratory Syncytial Virus NOT DETECTED NOT DETECTED Final   Bordetella pertussis NOT DETECTED NOT DETECTED Final   Bordetella Parapertussis NOT DETECTED NOT DETECTED Final   Chlamydophila pneumoniae NOT DETECTED NOT DETECTED Final   Mycoplasma pneumoniae NOT DETECTED NOT DETECTED Final    Comment: Performed at [facility], [place]. [address] [place], [place] [address]    Coagulation Studies: No results for input(s): "LABPROT", "INR" in the last 72 hours.  Urinalysis: No results for input(s): "COLORURINE", "LABSPEC", "PHURINE", "GLUCOSEU", "HGBUR", "BILIRUBINUR", "KETONESUR", "PROTEINUR", "UROBILINOGEN", "NITRITE", "LEUKOCYTESUR" in the last 72 hours.  Invalid input(s): "APPERANCEUR"     Imaging: No results found.   Medications:    azithromycin 500 mg (12/28/21 2227)   ceFEPime (MAXIPIME) IV 2  g (12/28/21 2136)    heparin  5,000 Units Subcutaneous Q8H   pravastatin  10 mg Oral QHS   sodium zirconium cyclosilicate  10 g Oral BID   tamsulosin  0.4 mg Oral Daily   tiotropium  18 mcg Inhalation Daily   acetaminophen **OR** acetaminophen, hydrALAZINE, ipratropium-albuterol, ondansetron (ZOFRAN) IV  Assessment/ Plan:  [name] is a 89 y.o.  male with past medical conditions including hypertension, diabetes, diastolic heart failure, COPD with CPAP, hyperlipidemia, who was admitted to [facility] on 12/20/2021 for Weakness [R53.1] Acute respiratory failure with hypoxia ([place]) [J96.01] HCAP (healthcare-associated pneumonia) [J18.9] Sepsis, due to unspecified organism, unspecified whether acute organ dysfunction present ([place]) [A41.9]   Acute kidney injury with hyperkalemia on chronic kidney disease stage IV.  Baseline creatinine appears to be 2.6 with GFR 23 on 11/09/2021.  Acute kidney injury secondary to infectious process.  Chronic kidney disease likely secondary to diabetes.    Renal function showing slow improvement. IVF stopped yesterday. UOP 820m overnight. Will continue to monitor renal function. No acute need for dialysis at this time. Due to current state and comorbidities, patient would be a poor long term dialysis candidate.    Lab Results  Component Value Date   CREATININE 4.15 (H) 12023-12-29  CREATININE 4.29 (H) 12/28/2021   CREATININE 3.95 (H) 12/27/2021    Intake/Output Summary (Last 24 hours) at [id number] Last data filed at [id number] Gross per 24 hour  Intake 0 ml  Output 1400 ml  Net -1400 ml    2. Anemia of chronic kidney disease Lab Results  Component Value Date   HGB 6.4 (L) 112/29/2023 Hemoglobin decreased.  Deferring blood transfusion to primary team.  3. Secondary Hyperparathyroidism: Presumed Lab Results  Component Value Date   CALCIUM 6.7 (L) 112/29/23   Calcium decreased but stable today. Likely due to poor nutritional  status.  4. Hypernatremia likely due to limited free water intake. Sodium 147. Will order D5W for management of sodium.   LOS: 4 Deveney Bayon 112/29/2023:19 PM

## 2022-01-02 NOTE — Progress Notes (Signed)
This RN called to bedside by family, patient had no pulse, confirmed by auscultation of heart. Verified by second RN Aileen Pilot. Time of death 86.Physician [place] notified.

## 2022-01-02 NOTE — Death Summary Note (Signed)
DEATH SUMMARY   Patient Details  Name: Arthur [name] MRN: [id number] DOB: August 05, 1933 ZYS:[id number], Chrystie Nose, MD Admission/Discharge Information   Admit Date:  Jan 12, 2022  Date of Death: 16-Jan-2022  Time of Death: 03/05/25  Length of Stay: 4   Principle Cause of death: Severe sepsis, aspiration pneumonia, complex loculated pleural effusion, acute renal failure on CKD stage IV, severe dysphagia.  Hospital Diagnoses: Principal Problem:   Acute on chronic respiratory failure with hypoxia (HCC) Active Problems:   Acute on chronic renal failure (HCC)   COPD (chronic obstructive pulmonary disease) (HCC)   Anemia in chronic kidney disease (CKD)   Type II diabetes mellitus (HCC)   Essential hypertension   HLD (hyperlipidemia)   Pleural effusion   HCAP (healthcare-associated pneumonia)   Acute respiratory failure with hypoxia (HCC)   Hyperkalemia   Protein-calorie malnutrition, severe (HCC)   Leukocytosis   Elevated troponin  Brief Narrative:    Arthur [name] is a 89 y.o. male with past medical history significant for HTN, DM2, advanced COPD/chronic hypoxic respiratory failure on 2 L nasal cannula at baseline, OSA, chronic diastolic congestive heart failure, history of complex right pleural effusion, CKD stage IV, anemia of chronic medical disease who presented to [facility] ED on 01/13/2023 from SNF with worsening hypoxia with increased O2 requirement.  Workup in the ED notable for left lower lobe bronchopneumonia complicated by chronic complex loculated right pleural effusion.  Patient was recently hospitalized and discharged on 12/22 after treatment for pneumonia Cova-19.  Despite supportive care, IV antibiotics patient has not been improving and palliative care was consulted for assistance with goals of care and medical decision making.  Even with improvement in patient's WBC count, he remains poorly responsive and unable to tolerate p.o. intake.  Discussion with patient's daughter that he would not  want life-prolonging artificial means to maintain life and patient's family met with palliative care on 01-17-2023 with decision to transition to comfort measures.   Assessment & Plan:   Acute on chronic hypoxic respiratory failure Left lower lobe pneumonia likely secondary to aspiration Chronic loculated complex pleural effusion Patient Arthur [name] presenting from SNF with increasing O2 requirements, adult failure to thrive and poorly responsive.  History of dysphagia with likely recurrent aspiration event with chest x-ray findings of left sided pneumonia.  WBC count elevated on admission to 30.5.  Completed 5-day course of cefepime and azithromycin with improvement of WBC count to 5.6.  Despite aggressive treatment and improvement of infection, patient remains poorly responsive and decision now has been made after discussion with palliative care to transition to comfort measures.  Unfortunately patient expired on 16-Jan-2022 at 1727.  Multiple family members present at bedside.   Acute renal failure on CKD stage IV Baseline creatinine 2.6.  Patient presenting with a creatinine of 2.80.  Patient was started on IV fluid hydration nephrology was consulted who assisted during hospital course.  Despite aggressive IV antibiotics, IV fluid hydration patient's creatinine continued to worsen.  Patient transitioned to comfort measures.   Severe oropharyngeal dysphagia Speech therapy consulted and followed during hospital course.  Modified barium swallow on 12/27 with high aspiration risk and recommended continue n.p.o. with alternate means.  Discussed with family patient would never want artificial means of feeding including feeding tube.  Now on comfort measures.   COPD Essential hypertension BPH Chronic diastolic congestive heart failure Discontinue home medications as now transitioning to comfort measures.   Procedures: None  Consultations: Palliative care, nephrology  The results of significant diagnostics  from this hospitalization (including imaging, microbiology, ancillary and laboratory) are listed below for reference.   Significant Diagnostic Studies: CT CHEST WO CONTRAST  Result Date: 12/13/2021 CLINICAL DATA:  Shortness of breath.  Nondiagnostic x-ray EXAM: CT CHEST WITHOUT CONTRAST TECHNIQUE: Multidetector CT imaging of the chest was performed following the standard protocol without IV contrast. RADIATION DOSE REDUCTION: This exam was performed according to the departmental dose-optimization program which includes automated exposure control, adjustment of the mA and/or kV according to patient size and/or use of iterative reconstruction technique. COMPARISON:  Radiographs earlier today and CT 12/12/2021 and ultrasound 12/12/2021 FINDINGS: Cardiovascular: Advanced coronary artery and aortic atherosclerotic calcification. Trace pericardial effusion. Enlarged central pulmonary arteries in keeping with changes of pulmonary arterial hypertension Mediastinum/Nodes: Similar mediastinal shift to the right compatible with right-sided volume loss. Unremarkable thyroid and esophagus. Unchanged prominent mediastinal and right hilar lymph nodes. For example 1.2 cm subcarinal node (2/63). Lungs/Pleura: Centrilobular emphysema greatest in the upper lungs. Diffuse bronchial wall thickening and mucous plugging greatest in the left lower lobe with new infiltrates and centrilobular micro nodules. This has increased from 12/12/2021. Complete collapse of the right lower lobe with extensive airway impaction similar to prior. Slightly improved aeration of the right middle lobe. Complex large posterior basal loculated right pleural effusion with heterogenous attenuation. This may relate to proteinaceous or hemorrhagic debris or progressive pleural soft tissue in the setting of mesothelioma or carcinoma. Evaluation is limited without IV contrast. Upper Abdomen: No acute abnormality. Musculoskeletal: Degenerative changes in the  thoracic spine with ankylosis the mid and lower thoracic spine. IMPRESSION: 1. Complex loculated right pleural effusion is grossly similar to chest CT and ultrasound 12/12/2021. Underlying malignancy is difficult to exclude without IV contrast. 2. Extensive airway impaction and complete collapse of the right lower lobe. Improved aeration of the right middle lobe compared to prior CT. Pneumonia is difficult to exclude. 3. New left lower lobe bronchopneumonia. 4. Coronary artery calcification. 5. Enlarged central pulmonary arteries in keeping with changes of pulmonary hypertension. 6. Indeterminate mediastinal and hilar nodes are unchanged from prior. Aortic Atherosclerosis (ICD10-I70.0) and Emphysema (ICD10-J43.9). Electronically Signed   By: Placido Sou M.D.   On: 12/13/2021 21:44   DG Chest Port 1 View  Result Date: 12/10/2021 CLINICAL DATA:  Short of breath. EXAM: PORTABLE CHEST 1 VIEW COMPARISON:  12/19/2021 and older studies. FINDINGS: Cardiac silhouette partly obscured, grossly top-normal in size to mildly enlarged. No mediastinal or hilar masses. There is opacity at the right lung base obscuring portions of the right heart border and the right hemidiaphragm, atelectasis/pneumonia. Lungs show bilateral interstitial prominence. This has improved from the most recent prior chest radiograph. Vascular congestion has also improved. No convincing left pleural effusion. No pneumothorax. Skeletal structures grossly intact. IMPRESSION: 1. Interval improvement when compared to the chest radiograph dated 12/19/2021. Specifically, no current evidence of pulmonary edema. 2. Persistent right lung base opacity consistent with a combination of a loculated effusion and atelectasis/pneumonia. No new lung abnormalities. Electronically Signed   By: Lajean Manes M.D.   On: 12/30/2021 14:29   DG Abd Portable 1V  Result Date: 12/19/2021 CLINICAL DATA:  Intractable nausea and vomiting EXAM: PORTABLE ABDOMEN - 1 VIEW  COMPARISON:  CT 03/04/2019 FINDINGS: There is no evidence of bowel obstruction. There is a right upper quadrant calcification consistent with gallstone seen on recent CT. No radiopaque calculi overlie the kidneys. Multilevel degenerative changes of the spine with bony ankylosis. Chronic compression deformities L2 and L3 with prior vertebroplasty. Diffuse osteopenia.  Degenerative changes of the hips. IMPRESSION: No evidence of bowel obstruction. Cholelithiasis. Electronically Signed   By: Maurine Simmering M.D.   On: 12/19/2021 20:30   DG Chest Port 1 View  Result Date: 12/19/2021 CLINICAL DATA:  Aspiration pneumonia EXAM: PORTABLE CHEST 1 VIEW COMPARISON:  12/12/2021 FINDINGS: Cardiomegaly as seen previously. Aortic atherosclerosis. Infiltrate/volume loss in the right lower lung consistent with pneumonia. There is probably some right pleural fluid as well. More widespread pulmonary density suggest fluid overload/pulmonary edema. No acute bone finding. IMPRESSION: Infiltrate/volume loss in the right lower lung consistent with pneumonia. Probable right effusion. More widespread pulmonary density suggest fluid overload/pulmonary edema. Electronically Signed   By: Nelson Chimes M.D.   On: 12/19/2021 08:56   [place] CHEST (PLEURAL EFFUSION)  Result Date: 12/12/2021 CLINICAL DATA:  Loculated pleural effusion EXAM: CHEST ULTRASOUND COMPARISON:  Multiple priors FINDINGS: Focused sonographic exam of the chest was performed for fluid assessment. In the right chest, there is echogenic debris in the right pleural space with small pockets of fluid. Left chest demonstrates no fluid. IMPRESSION: Chronic/organized echogenic material within the right pleural space with small pockets of fluid. Patient noted to have loculated effusion during last thoracentesis in January 2022. Patient unlikely to receive clinical benefit from thoracentesis or chest tube placement. Electronically Signed   By: Albin Felling M.D.   On: 12/12/2021 12:38    CT Chest Wo Contrast  Result Date: 12/12/2021 CLINICAL DATA:  Pneumonia, complication suspected, xray done EXAM: CT CHEST WITHOUT CONTRAST TECHNIQUE: Multidetector CT imaging of the chest was performed following the standard protocol without IV contrast. RADIATION DOSE REDUCTION: This exam was performed according to the departmental dose-optimization program which includes automated exposure control, adjustment of the mA and/or kV according to patient size and/or use of iterative reconstruction technique. COMPARISON:  10/07/2018, CT abdomen pelvis 03/04/2019 FINDINGS: Cardiovascular: Moderate multi-vessel coronary artery calcification. Global cardiac size within normal limits. No pericardial effusion. Central pulmonary arteries are enlarged in keeping with changes of pulmonary arterial hypertension. Moderate atherosclerotic calcification within the thoracic aorta. No aortic aneurysm. Mediastinum/Nodes: There is progressive mediastinal shift to the right related to right-sided volume loss. Visualized thyroid is unremarkable. No pathologic thoracic adenopathy. The esophagus is unremarkable. Small hiatal hernia. Lungs/Pleura: Complex large, posterobasal loculated right pleural effusion is again identified demonstrating heterogeneous attenuation which may relate to proteinaceous or hemorrhagic debris or progressive pleural soft tissue the setting of mesothelioma or metastatic adenocarcinoma. Evaluation is slightly limited by noncontrast technique. The right lower lobe is completely collapsed with extensive airway impaction. There is extensive airway impaction involving the right middle lobar bronchi with dense consolidation of the right middle lobe and near complete collapse. Mild emphysema. No pleural effusion on the left. No pneumothorax. Upper Abdomen: Cholelithiasis. Hyperdense gallbladder contents may relate to vicarious excretion of contrast or layering sludge. No superimposed pericholecystic inflammatory  change. No acute abnormality. Musculoskeletal: Degenerative changes are noted throughout the thoracolumbar spine with ankylosis of the vertebral bodies of T5-T12. L3 vertebroplasty has been performed. No acute bone abnormality. No lytic or blastic bone lesion. IMPRESSION: 1. Enlarging, complex loculated right pleural effusion demonstrating progressive heterogeneous attenuation which may relate to proteinaceous or hemorrhagic debris or progressive pleural soft tissue the setting of mesothelioma or metastatic adenocarcinoma. Repeat contrast enhanced imaging and/or diagnostic thoracentesis may be helpful for further evaluation. 2. Extensive airway impaction involving the right middle and lower lobes with complete collapse the right lower lobe and dense consolidation and near complete collapse of the right middle lobe.  Findings may relate to aspiration and/or acute lobar pneumonia. 3. Mild emphysema. 4. Moderate multi-vessel coronary artery calcification. 5. Cholelithiasis. Aortic Atherosclerosis (ICD10-I70.0) and Emphysema (ICD10-J43.9). Electronically Signed   By: Fidela Salisbury M.D.   On: 12/12/2021 03:22   DG Chest Portable 1 View  Result Date: 12/12/2021 CLINICAL DATA:  Generalized weakness EXAM: PORTABLE CHEST 1 VIEW COMPARISON:  01/14/2020 FINDINGS: Cardiac shadow is enlarged. Right-sided basilar consolidation is noted similar to that seen on the prior exam. Previous CT imaging has shown loculated pleural effusion in this region as well. The overall appearance is stable. Mild vascular congestion is noted without significant edema. No new focal infiltrate is noted. IMPRESSION: Persistent consolidation and loculated fluid on the right in the base stable from the prior exam. Mild vascular congestion. Electronically Signed   By: Inez Catalina M.D.   On: 12/12/2021 02:43    Microbiology: Recent Results (from the past 240 hour(s))  Blood Culture (routine x 2)     Status: None (Preliminary result)   Collection  Time: 12/26/2021  9:00 PM   Specimen: BLOOD LEFT ARM  Result Value Ref Range Status   Specimen Description   Final    BLOOD LEFT ARM Performed at [facility], [address], [place], [place] [address]    Special Requests   Final    BOTTLES DRAWN AEROBIC AND ANAEROBIC Blood Culture adequate volume Performed at [facility], [address] [place], [place] [address]    Culture  Setup Time   Final    AEROBIC BOTTLE ONLY GRAM NEGATIVE RODS CRITICAL RESULT CALLED TO, READ BACK BY AND VERIFIED WITH: ALEX CHAPPELL 01/26/22 1720 KLW Performed at [facility], [place] [address], [place], [place] [address]    Culture GRAM NEGATIVE RODS  Final   Report Status PENDING  Incomplete  Blood Culture ID Panel (Reflexed)     Status: Abnormal   Collection Time: 12/07/2021  9:00 PM  Result Value Ref Range Status   Enterococcus faecalis NOT DETECTED NOT DETECTED Final   Enterococcus Faecium NOT DETECTED NOT DETECTED Final   Listeria monocytogenes NOT DETECTED NOT DETECTED Final   Staphylococcus species NOT DETECTED NOT DETECTED Final   Staphylococcus aureus (BCID) NOT DETECTED NOT DETECTED Final   Staphylococcus epidermidis NOT DETECTED NOT DETECTED Final   Staphylococcus lugdunensis NOT DETECTED NOT DETECTED Final   Streptococcus species NOT DETECTED NOT DETECTED Final   Streptococcus agalactiae NOT DETECTED NOT DETECTED Final   Streptococcus pneumoniae NOT DETECTED NOT DETECTED Final   Streptococcus pyogenes NOT DETECTED NOT DETECTED Final   A.calcoaceticus-baumannii NOT DETECTED NOT DETECTED Final   Bacteroides fragilis NOT DETECTED NOT DETECTED Final   Enterobacterales DETECTED (A) NOT DETECTED Final    Comment: Enterobacterales represent a large order of gram negative bacteria, not a single organism. CRITICAL RESULT CALLED TO, READ BACK BY AND VERIFIED WITH: ALEX CHAPPELL January 26, 2022 1720 KLW    Enterobacter cloacae complex DETECTED (A) NOT DETECTED Final    Comment: CRITICAL  RESULT CALLED TO, READ BACK BY AND VERIFIED WITH: ALEX CHAPPELL 01/26/2022 1720 KLW    Escherichia coli NOT DETECTED NOT DETECTED Final   Klebsiella aerogenes NOT DETECTED NOT DETECTED Final   Klebsiella oxytoca NOT DETECTED NOT DETECTED Final   Klebsiella pneumoniae NOT DETECTED NOT DETECTED Final   Proteus species NOT DETECTED NOT DETECTED Final   Salmonella species NOT DETECTED NOT DETECTED Final   Serratia marcescens NOT DETECTED NOT DETECTED Final   Haemophilus influenzae NOT DETECTED NOT  DETECTED Final   Neisseria meningitidis NOT DETECTED NOT DETECTED Final   Pseudomonas aeruginosa NOT DETECTED NOT DETECTED Final   Stenotrophomonas maltophilia NOT DETECTED NOT DETECTED Final   Candida albicans NOT DETECTED NOT DETECTED Final   Candida auris NOT DETECTED NOT DETECTED Final   Candida glabrata NOT DETECTED NOT DETECTED Final   Candida krusei NOT DETECTED NOT DETECTED Final   Candida parapsilosis NOT DETECTED NOT DETECTED Final   Candida tropicalis NOT DETECTED NOT DETECTED Final   Cryptococcus neoformans/gattii NOT DETECTED NOT DETECTED Final   CTX-M ESBL NOT DETECTED NOT DETECTED Final   Carbapenem resistance IMP NOT DETECTED NOT DETECTED Final   Carbapenem resistance KPC NOT DETECTED NOT DETECTED Final   Carbapenem resistance NDM NOT DETECTED NOT DETECTED Final   Carbapenem resist OXA 48 LIKE NOT DETECTED NOT DETECTED Final   Carbapenem resistance VIM NOT DETECTED NOT DETECTED Final    Comment: Performed at [facility], [address] [place],  [address]  Resp panel by RT-PCR (RSV, Flu A&B, Covid) Anterior Nasal Swab     Status: Abnormal   Collection Time: 12/05/2021  9:02 PM   Specimen: Anterior Nasal Swab  Result Value Ref Range Status   SARS Coronavirus 2 by RT PCR POSITIVE (A) NEGATIVE Final    Comment: (NOTE) SARS-CoV-2 target nucleic acids are DETECTED.  The SARS-CoV-2 RNA is generally detectable in upper respiratory specimens during the acute phase of  infection. Positive results are indicative of the presence of the identified virus, but do not rule out bacterial infection or co-infection with other pathogens not detected by the test. Clinical correlation with patient history and other diagnostic information is necessary to determine patient infection status. The expected result is Negative.  Fact Sheet for Patients: EntrepreneurPulse.com.au  Fact Sheet for Healthcare Providers: IncredibleEmployment.be  This test is not yet approved or cleared by the [place] FDA and  has been authorized for detection and/or diagnosis of SARS-CoV-2 by FDA under an Emergency Use Authorization (EUA).  This EUA will remain in effect (meaning this test can be used) for the duration of  the COVID-19 declaration under Section 564(b)(1) of the A ct, 21 U.S.C. section 360bbb-3(b)(1), unless the authorization is terminated or revoked sooner.     Influenza A by PCR NEGATIVE NEGATIVE Final   Influenza B by PCR NEGATIVE NEGATIVE Final    Comment: (NOTE) The Xpert Xpress SARS-CoV-2/FLU/RSV plus assay is intended as an aid in the diagnosis of influenza from Nasopharyngeal swab specimens and should not be used as a sole basis for treatment. Nasal washings and aspirates are unacceptable for Xpert Xpress SARS-CoV-2/FLU/RSV testing.  Fact Sheet for Patients: EntrepreneurPulse.com.au  Fact Sheet for Healthcare Providers: IncredibleEmployment.be  This test is not yet approved or cleared by the [place] FDA and has been authorized for detection and/or diagnosis of SARS-CoV-2 by FDA under an Emergency Use Authorization (EUA). This EUA will remain in effect (meaning this test can be used) for the duration of the COVID-19 declaration under Section 564(b)(1) of the Act, 21 U.S.C. section 360bbb-3(b)(1), unless the authorization is terminated or revoked.     Resp Syncytial Virus by  PCR NEGATIVE NEGATIVE Final    Comment: (NOTE) Fact Sheet for Patients: EntrepreneurPulse.com.au  Fact Sheet for Healthcare Providers: IncredibleEmployment.be  This test is not yet approved or cleared by the [place] FDA and has been authorized for detection and/or diagnosis of SARS-CoV-2 by FDA under an Emergency Use Authorization (EUA). This EUA will remain in effect (meaning  this test can be used) for the duration of the COVID-19 declaration under Section 564(b)(1) of the Act, 21 U.S.C. section 360bbb-3(b)(1), unless the authorization is terminated or revoked.  Performed at [facility], [address] [place], [place] [address]   Blood Culture (routine x 2)     Status: None   Collection Time: 12/20/2021  9:02 PM   Specimen: BLOOD  Result Value Ref Range Status   Specimen Description BLOOD BLOOD LEFT ARM  Final   Special Requests   Final    BOTTLES DRAWN AEROBIC AND ANAEROBIC Blood Culture adequate volume   Culture   Final    NO GROWTH 5 DAYS Performed at [facility], [address], [place], [place] [address]    Report Status 12/30/2021 FINAL  Final  Urine Culture     Status: None   Collection Time: 12/27/2021  9:02 PM   Specimen: In/Out Cath Urine  Result Value Ref Range Status   Specimen Description   Final    IN/OUT CATH URINE Performed at [facility], [address] [place], [place] [address]    Special Requests   Final    NONE Performed at [facility], [address], [place], [place] [address]    Culture   Final    NO GROWTH Performed at [facility], [place] [address] [place], [place] [address]    Report Status 12/27/2021 FINAL  Final  MRSA Next Gen by PCR, Nasal     Status: None   Collection Time: 12/27/2021 11:12 PM   Specimen: Nasal Mucosa; Nasal Swab  Result Value Ref Range Status   MRSA by PCR Next Gen NOT DETECTED NOT DETECTED Final    Comment: (NOTE) The GeneXpert  MRSA Assay (FDA approved for NASAL specimens only), is one component of a comprehensive MRSA colonization surveillance program. It is not intended to diagnose MRSA infection nor to guide or monitor treatment for MRSA infections. Test performance is not FDA approved in patients less than 2 years old. Performed at [facility], [place], [place] [address]   Respiratory (~20 pathogens) panel by PCR     Status: None   Collection Time: 12/26/21  7:48 PM   Specimen: Nasopharyngeal Swab; Respiratory  Result Value Ref Range Status   Adenovirus NOT DETECTED NOT DETECTED Final   Coronavirus 229E NOT DETECTED NOT DETECTED Final    Comment: (NOTE) The Coronavirus on the Respiratory Panel, DOES NOT test for the novel  Coronavirus (2019 nCoV)    Coronavirus HKU1 NOT DETECTED NOT DETECTED Final   Coronavirus NL63 NOT DETECTED NOT DETECTED Final   Coronavirus OC43 NOT DETECTED NOT DETECTED Final   Metapneumovirus NOT DETECTED NOT DETECTED Final   Rhinovirus / Enterovirus NOT DETECTED NOT DETECTED Final   Influenza A NOT DETECTED NOT DETECTED Final   Influenza B NOT DETECTED NOT DETECTED Final   Parainfluenza Virus 1 NOT DETECTED NOT DETECTED Final   Parainfluenza Virus 2 NOT DETECTED NOT DETECTED Final   Parainfluenza Virus 3 NOT DETECTED NOT DETECTED Final   Parainfluenza Virus 4 NOT DETECTED NOT DETECTED Final   Respiratory Syncytial Virus NOT DETECTED NOT DETECTED Final   Bordetella pertussis NOT DETECTED NOT DETECTED Final   Bordetella Parapertussis NOT DETECTED NOT DETECTED Final   Chlamydophila pneumoniae NOT DETECTED NOT DETECTED Final   Mycoplasma pneumoniae NOT DETECTED NOT DETECTED Final    Comment: Performed at [facility], [place], [place], [place] [address]  Time spent: 31 minutes  Signed: Kanon [place] J [place] DO 12/30/21

## 2022-01-02 NOTE — Consult Note (Addendum)
Consultation Note Date: January 27, 2022   Patient Name: Arthur [name]  DOB: 10/15/1933  MRN: [id number]  Age / Sex: 89 y.o., male  PCP: Baxter Hire, MD Referring Physician: [place] Eric J, DO  Reason for Consultation: Establishing goals of care  HPI/Patient Profile: Arthur [name] is a 89 y.o. male with a PMH of HTN, DM2, Advanced COPD and Chronic Hypoxia on 2L [place] at baseline, OSA, Diastolic Heart Failure, complex right pleural effusion (last thoracentesis on 01/2020), CKD4 with anemia on EPO injections, recently discharged on 12/23/21 after treating for pneumonia and Covid -44 who presented from SNF rehab with hypoxia and increase O2 requirement and found to have acute on chronic hypoxic respiratory failure secondary to Left lower lobe bronchopneumonia.    Clinical Assessment and Goals of Care: Patient is known to our service from previous consults.  Notes and labs reviewed.  Spoke with SLP and discussed concerns for his swallow evaluation.  In to see patient.  Numerous family members present on the unit and at bedside including patient's daughter. Family advises that the patient's daughter-in-law is an Neurosurgeon here at the hospital.    Patient with no obvious distress noted.  Family member sitting at bedside swabbed patient's mouth, and patient was not responsive to this.  Daughter discusses his acute decline since hospitalization earlier this month, and discussed his decline during this admission.  She discusses that he was only in rehab for couple of days prior to this admission.  During our conversation staff arrived with comfort care food cart.  With further conversation daughter states that she just wants her father to be comfortable for the time that he has left on this earth, and they have already agreed to comfort care.  Discussed his status and options of discharge with hospice versus  hospital death.  With conversation and given his level of responsiveness I anticipate hospital death.    Discussed with family, and then later attending, that if his status improves, I would recommend TOC reaching out to hospice liaison to discuss placement.  Discussed that if he were to improve and hospice facility consideration should occur, patient would need to be be reevaluated when the bed becomes available to ensure he is still appropriate to go to the hospice facility.  Discussed with attending symptom management.  Daughter states patient has a son who lives near him, but with whom he does not really have a relationship with.  She states she has called him to try to get him to come if he is willing to see patient. She states she is H POA of patient, and patient's granddaughter is secondary HPOA.  In review of past PMT meetings with patient, he has always desired his daughter to be the person to assist him with making decisions.  Daughter states other family members are coming to visit. Staff working to move him to a larger room in anticipation.     SUMMARY OF RECOMMENDATIONS   Patient shifted to comfort care prior to my visit.  I anticipate a hospital death.  Orders placed for comfort, and this was discussed with attending MD who will make adjustments as needed.  PMT will follow-up on Monday if patient is still here.  Prognosis:  Hours - Days       Primary Diagnoses: Present on Admission:  HCAP (healthcare-associated pneumonia)  Pleural effusion  Essential hypertension  COPD (chronic obstructive pulmonary disease) (HCC)  Acute on chronic respiratory failure with hypoxia (HCC)  HLD (hyperlipidemia)  Acute on chronic renal failure (HCC)  Acute respiratory failure with hypoxia (HCC)  Hyperkalemia  Protein-calorie malnutrition, severe (HCC)  Leukocytosis  Anemia in chronic kidney disease (CKD)  Elevated troponin   I have reviewed the medical record, interviewed the patient  and family, and examined the patient. The following aspects are pertinent.  Past Medical History:  Diagnosis Date   Anemia of chronic kidney failure 08/21/2018   Arthritis    lower back   CHF (congestive heart failure) (HCC)    CKD (chronic kidney disease), stage III (HCC)    COPD (chronic obstructive pulmonary disease) ([place])    Diabetes mellitus without complication ([place])    type 2   Dyspnea    when active -wears oxygen 2L via [place]   History of blood transfusion    Hyperlipidemia    Hypertension    OSA on CPAP 12/12/2021   Requires continuous at home supplemental oxygen    2L via [place]   Squamous cell carcinoma in situ 09/17/2012   Right upper nasal dorsum. SCCis arising in [place]   Squamous cell carcinoma of skin 02/03/2020   R lat cheek, EDC   Social History   Socioeconomic History   Marital status: Widowed    Spouse name: Not on file   Number of children: Not on file   Years of education: Not on file   Highest education level: Not on file  Occupational History   Not on file  Tobacco Use   Smoking status: Former    Packs/day: 2.00    Years: 35.00    Total pack years: 70.00    Types: Cigarettes    Quit date: 11/04/1982    Years since quitting: 39.1   Smokeless tobacco: Current    Types: Chew  Vaping Use   Vaping Use: Never used  Substance and Sexual Activity   Alcohol use: Not Currently   Drug use: No   Sexual activity: Not Currently  Other Topics Concern   Not on file  Social History Narrative   Not on file   Social Determinants of Health   Financial Resource Strain: Low Risk  (07/12/2018)   Overall Financial Resource Strain (CARDIA)    Difficulty of Paying Living Expenses: Not hard at all  Food Insecurity: No Food Insecurity (12/27/2021)   Hunger Vital Sign    Worried About Running Out of Food in the Last Year: Never true    [place] in the Last Year: Never true  Transportation Needs: No Transportation Needs (12/27/2021)   PRAPARE - Armed forces logistics/support/administrative officer (Medical): No    Lack of Transportation (Non-Medical): No  Physical Activity: Unknown (06/27/2018)   Exercise Vital Sign    Days of Exercise per Week: 0 days    Minutes of Exercise per Session: Not on file  Stress: No Stress Concern Present (06/27/2018)   [place]    Feeling of Stress : Not at all  Social Connections: Not on  file   Family History  Problem Relation Age of Onset   COPD Mother    Cancer Father    Scheduled Meds:  heparin  5,000 Units Subcutaneous Q8H   pravastatin  10 mg Oral QHS   sodium zirconium cyclosilicate  10 g Oral BID   tamsulosin  0.4 mg Oral Daily   tiotropium  18 mcg Inhalation Daily   Continuous Infusions:  azithromycin 500 mg (12/28/21 2227)   ceFEPime (MAXIPIME) IV 2 g (12/28/21 2136)   dextrose     PRN Meds:.acetaminophen **OR** acetaminophen, hydrALAZINE, ipratropium-albuterol, ondansetron (ZOFRAN) IV Medications Prior to Admission:  Prior to Admission medications   Medication Sig Start Date End Date Taking? Authorizing Provider  amLODipine (NORVASC) 10 MG tablet Take 1 tablet (10 mg total) by mouth daily. 12/23/21  Yes Enzo Bi, MD  insulin aspart (NOVOLOG) 100 UNIT/ML injection 4 units prior to meals if sugars above 200 03/07/19  Yes Wieting, Richard, MD  Insulin Glargine Solostar (LANTUS) 100 UNIT/ML Solostar Pen Inject 4 Units into the skin at bedtime. 12/23/21  Yes [provider]  pravastatin (PRAVACHOL) 10 MG tablet Take 10 mg by mouth at bedtime. 04/25/18  Yes [provider]  promethazine (PHENERGAN) 25 MG tablet Take 25 mg by mouth every 4 (four) hours as needed for nausea or vomiting.   Yes [provider]  tamsulosin (FLOMAX) 0.4 MG CAPS capsule Take 0.4 mg by mouth daily. 04/25/18  Yes [provider]  tiotropium (SPIRIVA) 18 MCG inhalation capsule Place 18 mcg into inhaler and inhale daily.   Yes [provider]  torsemide (DEMADEX) 20 MG tablet Take 20 mg by mouth every Monday, Wednesday, and Friday.   Yes [provider]  acetaminophen (TYLENOL) 325 MG tablet Take 2 tablets (650 mg total) by mouth every 6 (six) hours as needed for mild pain (or Fever >/= 101). 03/07/19   Loletha Grayer, MD  albuterol (VENTOLIN HFA) 108 (90 Base) MCG/ACT inhaler Inhale 2 puffs into the lungs every 6 (six) hours as needed for wheezing or shortness of breath. 05/27/18   Demetrios Loll, MD  LEVEMIR 100 UNIT/ML injection Inject 0.04 mLs (4 Units total) into the skin at bedtime. Patient not taking: Reported on 12/26/2021 03/07/19   Loletha Grayer, MD  TRUE METRIX BLOOD GLUCOSE TEST test strip 1 each by Other route every morning. 06/20/19   [provider]   Allergies  Allergen Reactions   Codeine Other (See Comments)    Constipation   Review of Systems  Unable to perform ROS   Physical Exam Constitutional:      Comments: Eyes closed  Pulmonary:     Comments: Even and unlabored    Vital Signs: BP (!) 127/40 (BP Location: Right Arm)   Pulse 74   Temp 98.2 F (36.8 C)   Resp 20   Ht '5\' 5"'$  (1.651 m)   Wt 80.3 kg   SpO2 92%   BMI 29.46 kg/m  Pain Scale: 0-10   Pain Score: 0-No pain   SpO2: SpO2: 92 % O2 Device:SpO2: 92 % O2 Flow Rate: .O2 Flow Rate (L/min): 2 L/min  IO: Intake/output summary:  Intake/Output Summary (Last 24 hours) at 2022-01-01 1514 Last data filed at 01/01/2022 0805 Gross per 24 hour  Intake --  Output 1400 ml  Net -1400 ml    LBM: Last BM Date : 12/28/21 Baseline Weight: Weight: 72.6 kg Most recent weight: Weight: 80.3 kg      Signed by: Asencion Gowda,  NP   Please contact Palliative Medicine Team phone at [phone] for questions and concerns.  For individual provider: See Shea Evans

## 2022-01-02 DEATH — deceased

## 2022-05-23 ENCOUNTER — Ambulatory Visit: Payer: Medicare HMO | Admitting: Dermatology
